# Patient Record
Sex: Female | Born: 1940 | Race: Black or African American | Hispanic: No | Smoking: Never smoker
Health system: Southern US, Community
[De-identification: ages and names within clinical notes are randomized; demographics above are authoritative.]

## PROBLEM LIST (undated history)

## (undated) DIAGNOSIS — E663 Overweight: Secondary | ICD-10-CM

## (undated) DIAGNOSIS — M549 Dorsalgia, unspecified: Secondary | ICD-10-CM

## (undated) DIAGNOSIS — G43909 Migraine, unspecified, not intractable, without status migrainosus: Secondary | ICD-10-CM

## (undated) DIAGNOSIS — K219 Gastro-esophageal reflux disease without esophagitis: Secondary | ICD-10-CM

## (undated) DIAGNOSIS — J189 Pneumonia, unspecified organism: Secondary | ICD-10-CM

## (undated) DIAGNOSIS — M1990 Unspecified osteoarthritis, unspecified site: Secondary | ICD-10-CM

## (undated) DIAGNOSIS — R233 Spontaneous ecchymoses: Secondary | ICD-10-CM

## (undated) DIAGNOSIS — R351 Nocturia: Secondary | ICD-10-CM

## (undated) DIAGNOSIS — F329 Major depressive disorder, single episode, unspecified: Secondary | ICD-10-CM

## (undated) DIAGNOSIS — I2699 Other pulmonary embolism without acute cor pulmonale: Secondary | ICD-10-CM

## (undated) DIAGNOSIS — R350 Frequency of micturition: Secondary | ICD-10-CM

## (undated) DIAGNOSIS — Z7901 Long term (current) use of anticoagulants: Secondary | ICD-10-CM

## (undated) DIAGNOSIS — M2550 Pain in unspecified joint: Secondary | ICD-10-CM

## (undated) DIAGNOSIS — G8929 Other chronic pain: Secondary | ICD-10-CM

## (undated) DIAGNOSIS — E785 Hyperlipidemia, unspecified: Secondary | ICD-10-CM

## (undated) DIAGNOSIS — F0390 Unspecified dementia without behavioral disturbance: Secondary | ICD-10-CM

## (undated) DIAGNOSIS — I4891 Unspecified atrial fibrillation: Secondary | ICD-10-CM

## (undated) DIAGNOSIS — I10 Essential (primary) hypertension: Secondary | ICD-10-CM

## (undated) DIAGNOSIS — Z860100 Personal history of colon polyps, unspecified: Secondary | ICD-10-CM

## (undated) DIAGNOSIS — F419 Anxiety disorder, unspecified: Secondary | ICD-10-CM

## (undated) DIAGNOSIS — F32A Depression, unspecified: Secondary | ICD-10-CM

## (undated) DIAGNOSIS — Z8739 Personal history of other diseases of the musculoskeletal system and connective tissue: Secondary | ICD-10-CM

## (undated) DIAGNOSIS — D509 Iron deficiency anemia, unspecified: Secondary | ICD-10-CM

## (undated) DIAGNOSIS — Z8669 Personal history of other diseases of the nervous system and sense organs: Secondary | ICD-10-CM

## (undated) DIAGNOSIS — R413 Other amnesia: Secondary | ICD-10-CM

## (undated) DIAGNOSIS — G4700 Insomnia, unspecified: Secondary | ICD-10-CM

## (undated) DIAGNOSIS — R238 Other skin changes: Secondary | ICD-10-CM

## (undated) DIAGNOSIS — H409 Unspecified glaucoma: Secondary | ICD-10-CM

## (undated) DIAGNOSIS — Z9289 Personal history of other medical treatment: Secondary | ICD-10-CM

## (undated) DIAGNOSIS — L814 Other melanin hyperpigmentation: Secondary | ICD-10-CM

## (undated) DIAGNOSIS — M2540 Effusion, unspecified joint: Secondary | ICD-10-CM

## (undated) HISTORY — PX: DILATION AND CURETTAGE OF UTERUS: SHX78

## (undated) HISTORY — DX: Unspecified osteoarthritis, unspecified site: M19.90

## (undated) HISTORY — DX: Gastro-esophageal reflux disease without esophagitis: K21.9

## (undated) HISTORY — PX: ORIF ANKLE FRACTURE: SUR919

## (undated) HISTORY — DX: Overweight: E66.3

## (undated) HISTORY — DX: Other pulmonary embolism without acute cor pulmonale: I26.99

## (undated) HISTORY — DX: Unspecified atrial fibrillation: I48.91

## (undated) HISTORY — PX: UPPER GASTROINTESTINAL ENDOSCOPY: SHX188

## (undated) HISTORY — PX: COLONOSCOPY: SHX174

## (undated) HISTORY — PX: TONSILLECTOMY: SUR1361

## (undated) HISTORY — DX: Essential (primary) hypertension: I10

## (undated) HISTORY — DX: Iron deficiency anemia, unspecified: D50.9

## (undated) HISTORY — DX: Migraine, unspecified, not intractable, without status migrainosus: G43.909

## (undated) HISTORY — DX: Other amnesia: R41.3

## (undated) HISTORY — PX: URETHRAL DILATION: SUR417

## (undated) HISTORY — DX: Long term (current) use of anticoagulants: Z79.01

## (undated) HISTORY — DX: Hyperlipidemia, unspecified: E78.5

## (undated) HISTORY — DX: Depression, unspecified: F32.A

## (undated) HISTORY — PX: KNEE ARTHROSCOPY W/ MENISCECTOMY: SHX1879

## (undated) HISTORY — DX: Major depressive disorder, single episode, unspecified: F32.9

---

## 1967-02-16 DIAGNOSIS — J189 Pneumonia, unspecified organism: Secondary | ICD-10-CM

## 1967-02-16 DIAGNOSIS — Z9289 Personal history of other medical treatment: Secondary | ICD-10-CM

## 1967-02-16 HISTORY — DX: Pneumonia, unspecified organism: J18.9

## 1967-02-16 HISTORY — DX: Personal history of other medical treatment: Z92.89

## 2000-05-06 ENCOUNTER — Encounter (INDEPENDENT_AMBULATORY_CARE_PROVIDER_SITE_OTHER): Payer: Self-pay | Admitting: *Deleted

## 2000-05-28 ENCOUNTER — Emergency Department (HOSPITAL_COMMUNITY): Admission: EM | Admit: 2000-05-28 | Discharge: 2000-05-28 | Payer: Self-pay | Admitting: Emergency Medicine

## 2000-05-28 ENCOUNTER — Encounter: Payer: Self-pay | Admitting: Emergency Medicine

## 2000-12-28 ENCOUNTER — Ambulatory Visit (HOSPITAL_COMMUNITY): Admission: RE | Admit: 2000-12-28 | Discharge: 2000-12-28 | Payer: Self-pay | Admitting: Family Medicine

## 2000-12-28 ENCOUNTER — Encounter: Payer: Self-pay | Admitting: Family Medicine

## 2001-03-02 ENCOUNTER — Ambulatory Visit (HOSPITAL_COMMUNITY): Admission: RE | Admit: 2001-03-02 | Discharge: 2001-03-02 | Payer: Self-pay | Admitting: Otolaryngology

## 2001-03-02 ENCOUNTER — Encounter: Payer: Self-pay | Admitting: Otolaryngology

## 2001-08-15 ENCOUNTER — Encounter: Payer: Self-pay | Admitting: Family Medicine

## 2001-08-15 ENCOUNTER — Ambulatory Visit (HOSPITAL_COMMUNITY): Admission: RE | Admit: 2001-08-15 | Discharge: 2001-08-15 | Payer: Self-pay | Admitting: Family Medicine

## 2001-12-13 ENCOUNTER — Encounter: Payer: Self-pay | Admitting: Family Medicine

## 2001-12-13 ENCOUNTER — Ambulatory Visit (HOSPITAL_COMMUNITY): Admission: RE | Admit: 2001-12-13 | Discharge: 2001-12-13 | Payer: Self-pay | Admitting: Family Medicine

## 2002-01-18 ENCOUNTER — Ambulatory Visit (HOSPITAL_COMMUNITY): Admission: RE | Admit: 2002-01-18 | Discharge: 2002-01-18 | Payer: Self-pay | Admitting: Family Medicine

## 2002-01-18 ENCOUNTER — Encounter: Payer: Self-pay | Admitting: Family Medicine

## 2002-02-04 ENCOUNTER — Emergency Department (HOSPITAL_COMMUNITY): Admission: EM | Admit: 2002-02-04 | Discharge: 2002-02-04 | Payer: Self-pay | Admitting: Emergency Medicine

## 2002-02-28 ENCOUNTER — Ambulatory Visit (HOSPITAL_COMMUNITY): Admission: RE | Admit: 2002-02-28 | Discharge: 2002-02-28 | Payer: Self-pay | Admitting: Family Medicine

## 2002-02-28 ENCOUNTER — Encounter: Payer: Self-pay | Admitting: Family Medicine

## 2002-03-05 ENCOUNTER — Ambulatory Visit (HOSPITAL_COMMUNITY): Admission: RE | Admit: 2002-03-05 | Discharge: 2002-03-05 | Payer: Self-pay | Admitting: Internal Medicine

## 2002-03-14 ENCOUNTER — Ambulatory Visit (HOSPITAL_COMMUNITY): Admission: RE | Admit: 2002-03-14 | Discharge: 2002-03-14 | Payer: Self-pay | Admitting: General Surgery

## 2002-03-19 ENCOUNTER — Ambulatory Visit (HOSPITAL_COMMUNITY): Admission: RE | Admit: 2002-03-19 | Discharge: 2002-03-19 | Payer: Self-pay | Admitting: Urology

## 2002-03-19 ENCOUNTER — Encounter: Payer: Self-pay | Admitting: Urology

## 2002-05-29 ENCOUNTER — Encounter: Payer: Self-pay | Admitting: Family Medicine

## 2002-05-29 ENCOUNTER — Ambulatory Visit (HOSPITAL_COMMUNITY): Admission: RE | Admit: 2002-05-29 | Discharge: 2002-05-29 | Payer: Self-pay | Admitting: Family Medicine

## 2002-06-27 ENCOUNTER — Emergency Department (HOSPITAL_COMMUNITY): Admission: EM | Admit: 2002-06-27 | Discharge: 2002-06-27 | Payer: Self-pay | Admitting: Internal Medicine

## 2002-10-12 ENCOUNTER — Encounter: Payer: Self-pay | Admitting: Orthopedic Surgery

## 2002-10-12 ENCOUNTER — Ambulatory Visit: Admission: RE | Admit: 2002-10-12 | Discharge: 2002-10-12 | Payer: Self-pay | Admitting: Orthopedic Surgery

## 2003-02-11 ENCOUNTER — Ambulatory Visit (HOSPITAL_COMMUNITY): Admission: RE | Admit: 2003-02-11 | Discharge: 2003-02-11 | Payer: Self-pay | Admitting: Family Medicine

## 2003-03-15 ENCOUNTER — Inpatient Hospital Stay (HOSPITAL_COMMUNITY): Admission: EM | Admit: 2003-03-15 | Discharge: 2003-03-18 | Payer: Self-pay | Admitting: *Deleted

## 2003-04-15 ENCOUNTER — Ambulatory Visit (HOSPITAL_COMMUNITY): Admission: RE | Admit: 2003-04-15 | Discharge: 2003-04-15 | Payer: Self-pay | Admitting: Family Medicine

## 2003-06-14 ENCOUNTER — Ambulatory Visit (HOSPITAL_COMMUNITY): Admission: RE | Admit: 2003-06-14 | Discharge: 2003-06-14 | Payer: Self-pay | Admitting: Family Medicine

## 2003-09-17 ENCOUNTER — Ambulatory Visit (HOSPITAL_COMMUNITY): Admission: RE | Admit: 2003-09-17 | Discharge: 2003-09-17 | Payer: Self-pay | Admitting: Internal Medicine

## 2003-09-27 ENCOUNTER — Inpatient Hospital Stay (HOSPITAL_COMMUNITY): Admission: EM | Admit: 2003-09-27 | Discharge: 2003-09-29 | Payer: Self-pay | Admitting: Emergency Medicine

## 2003-12-24 ENCOUNTER — Ambulatory Visit: Payer: Self-pay | Admitting: Family Medicine

## 2003-12-25 ENCOUNTER — Ambulatory Visit: Payer: Self-pay | Admitting: Orthopedic Surgery

## 2004-01-13 ENCOUNTER — Ambulatory Visit: Payer: Self-pay | Admitting: Family Medicine

## 2004-01-30 ENCOUNTER — Ambulatory Visit: Payer: Self-pay | Admitting: Family Medicine

## 2004-03-16 ENCOUNTER — Ambulatory Visit: Payer: Self-pay | Admitting: Family Medicine

## 2004-04-08 ENCOUNTER — Ambulatory Visit: Payer: Self-pay | Admitting: Family Medicine

## 2004-05-07 ENCOUNTER — Ambulatory Visit: Payer: Self-pay | Admitting: Orthopedic Surgery

## 2004-05-30 ENCOUNTER — Emergency Department (HOSPITAL_COMMUNITY): Admission: EM | Admit: 2004-05-30 | Discharge: 2004-05-30 | Payer: Self-pay | Admitting: Emergency Medicine

## 2004-07-01 ENCOUNTER — Ambulatory Visit: Payer: Self-pay | Admitting: Family Medicine

## 2004-07-10 ENCOUNTER — Ambulatory Visit: Payer: Self-pay | Admitting: Family Medicine

## 2004-07-21 ENCOUNTER — Ambulatory Visit: Payer: Self-pay | Admitting: Family Medicine

## 2004-07-24 ENCOUNTER — Ambulatory Visit (HOSPITAL_COMMUNITY): Admission: RE | Admit: 2004-07-24 | Discharge: 2004-07-24 | Payer: Self-pay | Admitting: Family Medicine

## 2004-08-20 ENCOUNTER — Ambulatory Visit: Payer: Self-pay | Admitting: Family Medicine

## 2004-09-09 ENCOUNTER — Emergency Department (HOSPITAL_COMMUNITY): Admission: EM | Admit: 2004-09-09 | Discharge: 2004-09-09 | Payer: Self-pay | Admitting: Emergency Medicine

## 2004-10-14 ENCOUNTER — Ambulatory Visit: Payer: Self-pay | Admitting: Family Medicine

## 2004-12-29 ENCOUNTER — Ambulatory Visit: Payer: Self-pay | Admitting: Family Medicine

## 2005-02-01 ENCOUNTER — Ambulatory Visit: Payer: Self-pay | Admitting: Family Medicine

## 2005-03-12 ENCOUNTER — Ambulatory Visit (HOSPITAL_COMMUNITY): Admission: RE | Admit: 2005-03-12 | Discharge: 2005-03-12 | Payer: Self-pay | Admitting: Family Medicine

## 2005-03-17 ENCOUNTER — Ambulatory Visit: Payer: Self-pay | Admitting: Family Medicine

## 2005-04-27 ENCOUNTER — Ambulatory Visit: Payer: Self-pay | Admitting: Family Medicine

## 2005-05-16 ENCOUNTER — Encounter (INDEPENDENT_AMBULATORY_CARE_PROVIDER_SITE_OTHER): Payer: Self-pay | Admitting: *Deleted

## 2005-05-16 LAB — CONVERTED CEMR LAB: Pap Smear: NORMAL

## 2005-05-25 ENCOUNTER — Encounter: Payer: Self-pay | Admitting: Family Medicine

## 2005-05-25 ENCOUNTER — Other Ambulatory Visit: Admission: RE | Admit: 2005-05-25 | Discharge: 2005-05-25 | Payer: Self-pay | Admitting: Family Medicine

## 2005-05-25 ENCOUNTER — Ambulatory Visit: Payer: Self-pay | Admitting: Family Medicine

## 2005-05-27 ENCOUNTER — Ambulatory Visit (HOSPITAL_COMMUNITY): Admission: RE | Admit: 2005-05-27 | Discharge: 2005-05-27 | Payer: Self-pay | Admitting: Family Medicine

## 2005-07-06 ENCOUNTER — Ambulatory Visit: Payer: Self-pay | Admitting: Family Medicine

## 2005-07-15 ENCOUNTER — Ambulatory Visit: Payer: Self-pay | Admitting: Family Medicine

## 2005-07-15 ENCOUNTER — Ambulatory Visit (HOSPITAL_COMMUNITY): Admission: RE | Admit: 2005-07-15 | Discharge: 2005-07-15 | Payer: Self-pay | Admitting: Family Medicine

## 2005-08-19 ENCOUNTER — Ambulatory Visit: Payer: Self-pay | Admitting: Family Medicine

## 2005-08-20 ENCOUNTER — Ambulatory Visit: Payer: Self-pay | Admitting: Cardiology

## 2005-08-30 ENCOUNTER — Ambulatory Visit: Payer: Self-pay | Admitting: Cardiology

## 2005-08-30 ENCOUNTER — Encounter (HOSPITAL_COMMUNITY): Admission: RE | Admit: 2005-08-30 | Discharge: 2005-09-29 | Payer: Self-pay | Admitting: Cardiology

## 2005-08-30 ENCOUNTER — Ambulatory Visit: Payer: Self-pay | Admitting: *Deleted

## 2005-08-31 ENCOUNTER — Ambulatory Visit (HOSPITAL_COMMUNITY): Admission: RE | Admit: 2005-08-31 | Discharge: 2005-08-31 | Payer: Self-pay | Admitting: Family Medicine

## 2005-09-03 ENCOUNTER — Ambulatory Visit: Payer: Self-pay | Admitting: *Deleted

## 2005-09-14 ENCOUNTER — Ambulatory Visit: Payer: Self-pay | Admitting: *Deleted

## 2005-10-14 ENCOUNTER — Ambulatory Visit: Payer: Self-pay | Admitting: Family Medicine

## 2005-11-18 ENCOUNTER — Ambulatory Visit: Payer: Self-pay | Admitting: Family Medicine

## 2005-12-09 ENCOUNTER — Ambulatory Visit: Payer: Self-pay | Admitting: Family Medicine

## 2005-12-14 ENCOUNTER — Ambulatory Visit (HOSPITAL_COMMUNITY): Admission: RE | Admit: 2005-12-14 | Discharge: 2005-12-14 | Payer: Self-pay | Admitting: Family Medicine

## 2006-02-23 ENCOUNTER — Ambulatory Visit: Payer: Self-pay | Admitting: Family Medicine

## 2006-04-12 ENCOUNTER — Ambulatory Visit: Payer: Self-pay | Admitting: Family Medicine

## 2006-05-04 ENCOUNTER — Encounter: Payer: Self-pay | Admitting: Family Medicine

## 2006-05-04 ENCOUNTER — Ambulatory Visit (HOSPITAL_COMMUNITY): Admission: RE | Admit: 2006-05-04 | Discharge: 2006-05-04 | Payer: Self-pay | Admitting: Family Medicine

## 2006-05-04 LAB — CONVERTED CEMR LAB
ALT: 12 units/L (ref 0–35)
AST: 14 units/L (ref 0–37)
Albumin: 4.1 g/dL (ref 3.5–5.2)
Basophils Absolute: 0 10*3/uL (ref 0.0–0.1)
Bilirubin, Direct: <0.1 mg/dL (ref 0.0–0.3)
CO2: 23 meq/L (ref 19–32)
Calcium: 9.2 mg/dL (ref 8.4–10.5)
Creatinine, Ser: 1.02 mg/dL (ref 0.40–1.20)
Glucose, Bld: 92 mg/dL (ref 70–99)
HCT: 36.4 % (ref 36.0–46.0)
HDL: 58 mg/dL (ref >39)
Hemoglobin: 11.9 g/dL — ABNORMAL LOW (ref 12.0–15.0)
Hgb A1c MFr Bld: 6 % (ref 4.6–6.1)
MCV: 87.7 fL (ref 78.0–100.0)
Monocytes Relative: 6 % (ref 3–11)
Neutrophils Relative %: 54 % (ref 43–77)
Platelets: 351 10*3/uL (ref 150–400)
Potassium: 3.6 meq/L (ref 3.5–5.3)
RBC: 4.15 M/uL (ref 3.87–5.11)
VLDL: 26 mg/dL (ref 0–40)

## 2006-05-17 ENCOUNTER — Encounter (INDEPENDENT_AMBULATORY_CARE_PROVIDER_SITE_OTHER): Payer: Self-pay | Admitting: *Deleted

## 2006-05-17 ENCOUNTER — Other Ambulatory Visit: Admission: RE | Admit: 2006-05-17 | Discharge: 2006-05-17 | Payer: Self-pay | Admitting: Family Medicine

## 2006-05-17 ENCOUNTER — Ambulatory Visit: Payer: Self-pay | Admitting: Family Medicine

## 2006-05-17 ENCOUNTER — Encounter: Payer: Self-pay | Admitting: Family Medicine

## 2006-05-19 ENCOUNTER — Encounter: Payer: Self-pay | Admitting: Family Medicine

## 2006-07-08 ENCOUNTER — Ambulatory Visit: Payer: Self-pay | Admitting: Family Medicine

## 2006-08-18 ENCOUNTER — Ambulatory Visit: Payer: Self-pay | Admitting: Family Medicine

## 2006-08-23 ENCOUNTER — Encounter: Payer: Self-pay | Admitting: Family Medicine

## 2007-01-05 ENCOUNTER — Ambulatory Visit: Payer: Self-pay | Admitting: Family Medicine

## 2007-02-03 ENCOUNTER — Encounter (INDEPENDENT_AMBULATORY_CARE_PROVIDER_SITE_OTHER): Payer: Self-pay | Admitting: *Deleted

## 2007-02-20 ENCOUNTER — Ambulatory Visit: Payer: Self-pay | Admitting: Family Medicine

## 2007-02-21 ENCOUNTER — Encounter: Payer: Self-pay | Admitting: Family Medicine

## 2007-03-24 ENCOUNTER — Ambulatory Visit: Payer: Self-pay | Admitting: Family Medicine

## 2007-03-24 ENCOUNTER — Ambulatory Visit (HOSPITAL_COMMUNITY): Admission: RE | Admit: 2007-03-24 | Discharge: 2007-03-24 | Payer: Self-pay | Admitting: Family Medicine

## 2007-05-23 ENCOUNTER — Ambulatory Visit: Payer: Self-pay | Admitting: Family Medicine

## 2007-05-24 ENCOUNTER — Encounter: Payer: Self-pay | Admitting: Family Medicine

## 2007-05-24 LAB — CONVERTED CEMR LAB: Microalb, Ur: 1.32 mg/dL (ref 0.00–1.89)

## 2007-05-30 DIAGNOSIS — K219 Gastro-esophageal reflux disease without esophagitis: Secondary | ICD-10-CM

## 2007-05-30 DIAGNOSIS — G43909 Migraine, unspecified, not intractable, without status migrainosus: Secondary | ICD-10-CM

## 2007-05-30 DIAGNOSIS — E785 Hyperlipidemia, unspecified: Secondary | ICD-10-CM

## 2007-05-30 DIAGNOSIS — F068 Other specified mental disorders due to known physiological condition: Secondary | ICD-10-CM

## 2007-05-30 DIAGNOSIS — I10 Essential (primary) hypertension: Secondary | ICD-10-CM

## 2007-06-06 ENCOUNTER — Encounter: Payer: Self-pay | Admitting: Family Medicine

## 2007-06-06 LAB — CONVERTED CEMR LAB
ALT: 17 units/L (ref 0–35)
Albumin: 4.1 g/dL (ref 3.5–5.2)
Alkaline Phosphatase: 96 units/L (ref 39–117)
BUN: 31 mg/dL — ABNORMAL HIGH (ref 6–23)
Basophils Relative: 0 % (ref 0–1)
Calcium: 9.8 mg/dL (ref 8.4–10.5)
Chloride: 105 meq/L (ref 96–112)
Cholesterol: 226 mg/dL — ABNORMAL HIGH (ref 0–200)
Eosinophils Absolute: 0 10*3/uL (ref 0.0–0.7)
Eosinophils Relative: 0 % (ref 0–5)
Glucose, Bld: 102 mg/dL — ABNORMAL HIGH (ref 70–99)
HDL: 84 mg/dL (ref >39)
MCV: 93 fL (ref 78.0–100.0)
Monocytes Absolute: 0.4 10*3/uL (ref 0.1–1.0)
Neutro Abs: 2.7 10*3/uL (ref 1.7–7.7)
Neutrophils Relative %: 50 % (ref 43–77)
Platelets: 297 10*3/uL (ref 150–400)
RBC: 4.17 M/uL (ref 3.87–5.11)
RDW: 14.6 % (ref 11.5–15.5)
Total Protein: 7.3 g/dL (ref 6.0–8.3)
VLDL: 25 mg/dL (ref 0–40)
WBC: 5.3 10*3/uL (ref 4.0–10.5)

## 2007-07-24 ENCOUNTER — Ambulatory Visit: Payer: Self-pay | Admitting: Family Medicine

## 2007-10-09 ENCOUNTER — Ambulatory Visit: Payer: Self-pay | Admitting: Family Medicine

## 2007-10-26 ENCOUNTER — Encounter: Payer: Self-pay | Admitting: Family Medicine

## 2007-10-30 LAB — CONVERTED CEMR LAB
Cholesterol: 245 mg/dL — ABNORMAL HIGH (ref 0–200)
LDL Cholesterol: 134 mg/dL — ABNORMAL HIGH (ref 0–99)
Total CHOL/HDL Ratio: 3
Triglycerides: 145 mg/dL (ref <150)
VLDL: 29 mg/dL (ref 0–40)

## 2007-11-07 ENCOUNTER — Encounter: Payer: Self-pay | Admitting: Family Medicine

## 2007-11-14 ENCOUNTER — Ambulatory Visit: Payer: Self-pay | Admitting: Family Medicine

## 2007-12-04 ENCOUNTER — Encounter: Payer: Self-pay | Admitting: Family Medicine

## 2007-12-07 ENCOUNTER — Encounter: Payer: Self-pay | Admitting: Family Medicine

## 2007-12-15 ENCOUNTER — Encounter: Payer: Self-pay | Admitting: Family Medicine

## 2007-12-18 ENCOUNTER — Encounter: Payer: Self-pay | Admitting: Family Medicine

## 2007-12-19 ENCOUNTER — Ambulatory Visit: Payer: Self-pay | Admitting: Family Medicine

## 2007-12-25 ENCOUNTER — Encounter: Payer: Self-pay | Admitting: Family Medicine

## 2008-01-02 ENCOUNTER — Encounter: Payer: Self-pay | Admitting: Family Medicine

## 2008-01-22 ENCOUNTER — Emergency Department (HOSPITAL_COMMUNITY): Admission: EM | Admit: 2008-01-22 | Discharge: 2008-01-22 | Payer: Self-pay | Admitting: Emergency Medicine

## 2008-01-26 ENCOUNTER — Ambulatory Visit: Payer: Self-pay | Admitting: Family Medicine

## 2008-01-26 DIAGNOSIS — R0989 Other specified symptoms and signs involving the circulatory and respiratory systems: Secondary | ICD-10-CM

## 2008-02-19 ENCOUNTER — Encounter: Payer: Self-pay | Admitting: Family Medicine

## 2008-02-20 ENCOUNTER — Ambulatory Visit: Payer: Self-pay | Admitting: Family Medicine

## 2008-02-20 DIAGNOSIS — Z8639 Personal history of other endocrine, nutritional and metabolic disease: Secondary | ICD-10-CM | POA: Insufficient documentation

## 2008-02-20 LAB — CONVERTED CEMR LAB: Glucose, Bld: 136 mg/dL

## 2008-02-21 ENCOUNTER — Encounter: Payer: Self-pay | Admitting: Family Medicine

## 2008-02-22 LAB — CONVERTED CEMR LAB
ALT: 22 units/L (ref 0–35)
AST: 15 units/L (ref 0–37)
Albumin: 4.3 g/dL (ref 3.5–5.2)
BUN: 29 mg/dL — ABNORMAL HIGH (ref 6–23)
Bilirubin, Direct: 0.1 mg/dL (ref 0.0–0.3)
Potassium: 4 meq/L (ref 3.5–5.3)
Sodium: 145 meq/L (ref 135–145)
TSH: 0.685 microintl units/mL (ref 0.350–4.50)
Total Bilirubin: 0.4 mg/dL (ref 0.3–1.2)
Total CHOL/HDL Ratio: 3.2
Triglycerides: 93 mg/dL (ref <150)
VLDL: 19 mg/dL (ref 0–40)

## 2008-02-26 DIAGNOSIS — F329 Major depressive disorder, single episode, unspecified: Secondary | ICD-10-CM

## 2008-03-14 ENCOUNTER — Encounter: Payer: Self-pay | Admitting: Family Medicine

## 2008-03-15 ENCOUNTER — Telehealth: Payer: Self-pay | Admitting: Family Medicine

## 2008-04-02 ENCOUNTER — Telehealth: Payer: Self-pay | Admitting: Family Medicine

## 2008-04-03 ENCOUNTER — Encounter: Payer: Self-pay | Admitting: Family Medicine

## 2008-04-03 ENCOUNTER — Telehealth: Payer: Self-pay | Admitting: Family Medicine

## 2008-06-07 ENCOUNTER — Telehealth: Payer: Self-pay | Admitting: Family Medicine

## 2008-06-10 ENCOUNTER — Encounter: Payer: Self-pay | Admitting: Family Medicine

## 2008-06-17 ENCOUNTER — Telehealth: Payer: Self-pay | Admitting: Family Medicine

## 2008-06-17 ENCOUNTER — Ambulatory Visit: Payer: Self-pay | Admitting: Family Medicine

## 2008-06-17 LAB — CONVERTED CEMR LAB
AST: 15 units/L (ref 0–37)
Albumin: 4.3 g/dL (ref 3.5–5.2)
Alkaline Phosphatase: 116 units/L (ref 39–117)
Basophils Absolute: 0 10*3/uL (ref 0.0–0.1)
Bilirubin, Direct: <0.1 mg/dL (ref 0.0–0.3)
Cholesterol: 173 mg/dL (ref 0–200)
Eosinophils Relative: 0 % (ref 0–5)
LDL Cholesterol: 72 mg/dL (ref 0–99)
Lymphs Abs: 1.7 10*3/uL (ref 0.7–4.0)
MCHC: 32.2 g/dL (ref 30.0–36.0)
Monocytes Absolute: 0.6 10*3/uL (ref 0.1–1.0)
Monocytes Relative: 5 % (ref 3–12)
Neutro Abs: 8.7 10*3/uL — ABNORMAL HIGH (ref 1.7–7.7)
Platelets: 301 10*3/uL (ref 150–400)
Total CHOL/HDL Ratio: 2.2
Total Protein: 7.5 g/dL (ref 6.0–8.3)
Triglycerides: 107 mg/dL (ref <150)
WBC: 11 10*3/uL — ABNORMAL HIGH (ref 4.0–10.5)

## 2008-07-01 ENCOUNTER — Encounter: Payer: Self-pay | Admitting: Family Medicine

## 2008-07-02 ENCOUNTER — Telehealth: Payer: Self-pay | Admitting: Family Medicine

## 2008-08-06 ENCOUNTER — Ambulatory Visit: Payer: Self-pay | Admitting: Family Medicine

## 2008-08-06 ENCOUNTER — Ambulatory Visit (HOSPITAL_COMMUNITY): Admission: RE | Admit: 2008-08-06 | Discharge: 2008-08-06 | Payer: Self-pay | Admitting: Family Medicine

## 2008-08-12 ENCOUNTER — Encounter: Payer: Self-pay | Admitting: Family Medicine

## 2008-08-20 ENCOUNTER — Telehealth: Payer: Self-pay | Admitting: Family Medicine

## 2008-08-23 ENCOUNTER — Encounter: Payer: Self-pay | Admitting: Family Medicine

## 2008-08-26 ENCOUNTER — Encounter: Payer: Self-pay | Admitting: Family Medicine

## 2008-10-22 ENCOUNTER — Ambulatory Visit: Payer: Self-pay | Admitting: Family Medicine

## 2008-10-22 ENCOUNTER — Other Ambulatory Visit: Admission: RE | Admit: 2008-10-22 | Discharge: 2008-10-22 | Payer: Self-pay | Admitting: Family Medicine

## 2008-10-22 ENCOUNTER — Encounter (INDEPENDENT_AMBULATORY_CARE_PROVIDER_SITE_OTHER): Payer: Self-pay | Admitting: *Deleted

## 2008-10-22 ENCOUNTER — Encounter: Payer: Self-pay | Admitting: Family Medicine

## 2008-10-22 LAB — CONVERTED CEMR LAB: Blood Glucose, Fasting: 149 mg/dL

## 2008-11-11 ENCOUNTER — Telehealth: Payer: Self-pay | Admitting: Family Medicine

## 2008-12-10 ENCOUNTER — Telehealth: Payer: Self-pay | Admitting: Family Medicine

## 2008-12-17 ENCOUNTER — Telehealth: Payer: Self-pay | Admitting: Family Medicine

## 2009-01-21 ENCOUNTER — Ambulatory Visit: Payer: Self-pay | Admitting: Family Medicine

## 2009-01-21 LAB — CONVERTED CEMR LAB
Glucose, Bld: 122 mg/dL
Hgb A1c MFr Bld: 6.1 %

## 2009-01-21 LAB — HM DIABETES FOOT EXAM

## 2009-02-03 ENCOUNTER — Ambulatory Visit: Payer: Self-pay | Admitting: Family Medicine

## 2009-02-03 LAB — CONVERTED CEMR LAB
AST: 18 units/L (ref 0–37)
Albumin: 3.9 g/dL (ref 3.5–5.2)
Creatinine, Ser: 0.95 mg/dL (ref 0.40–1.20)
LDL Cholesterol: 71 mg/dL (ref 0–99)
Sodium: 143 meq/L (ref 135–145)
Total Bilirubin: 0.1 mg/dL — ABNORMAL LOW (ref 0.3–1.2)
Total Protein: 6.9 g/dL (ref 6.0–8.3)
Triglycerides: 82 mg/dL (ref <150)
VLDL: 16 mg/dL (ref 0–40)

## 2009-02-20 ENCOUNTER — Encounter: Payer: Self-pay | Admitting: Family Medicine

## 2009-02-20 LAB — CONVERTED CEMR LAB

## 2009-02-21 ENCOUNTER — Telehealth: Payer: Self-pay | Admitting: Family Medicine

## 2009-03-27 ENCOUNTER — Encounter (INDEPENDENT_AMBULATORY_CARE_PROVIDER_SITE_OTHER): Payer: Self-pay | Admitting: *Deleted

## 2009-06-05 ENCOUNTER — Encounter: Payer: Self-pay | Admitting: Family Medicine

## 2009-06-05 DIAGNOSIS — R5381 Other malaise: Secondary | ICD-10-CM

## 2009-06-05 DIAGNOSIS — R5383 Other fatigue: Secondary | ICD-10-CM

## 2009-06-09 ENCOUNTER — Telehealth: Payer: Self-pay | Admitting: Family Medicine

## 2009-06-09 ENCOUNTER — Ambulatory Visit: Payer: Self-pay | Admitting: Family Medicine

## 2009-06-09 DIAGNOSIS — H6690 Otitis media, unspecified, unspecified ear: Secondary | ICD-10-CM | POA: Insufficient documentation

## 2009-06-09 DIAGNOSIS — M1710 Unilateral primary osteoarthritis, unspecified knee: Secondary | ICD-10-CM | POA: Insufficient documentation

## 2009-06-09 DIAGNOSIS — R42 Dizziness and giddiness: Secondary | ICD-10-CM | POA: Insufficient documentation

## 2009-06-09 DIAGNOSIS — M26609 Unspecified temporomandibular joint disorder, unspecified side: Secondary | ICD-10-CM | POA: Insufficient documentation

## 2009-06-09 LAB — CONVERTED CEMR LAB: Blood Glucose, Fasting: 134 mg/dL

## 2009-06-16 ENCOUNTER — Encounter: Payer: Self-pay | Admitting: Family Medicine

## 2009-06-23 LAB — CONVERTED CEMR LAB
BUN: 19 mg/dL (ref 6–23)
Bilirubin, Direct: 0.1 mg/dL (ref 0.0–0.3)
CO2: 23 meq/L (ref 19–32)
Chloride: 106 meq/L (ref 96–112)
Eosinophils Relative: 0 % (ref 0–5)
Glucose, Bld: 117 mg/dL — ABNORMAL HIGH (ref 70–99)
HCT: 37.2 % (ref 36.0–46.0)
Hemoglobin: 11.9 g/dL — ABNORMAL LOW (ref 12.0–15.0)
Hgb A1c MFr Bld: 6.3 % — ABNORMAL HIGH (ref <5.7)
Indirect Bilirubin: 0.2 mg/dL (ref 0.0–0.9)
LDL Cholesterol: 82 mg/dL (ref 0–99)
Lymphocytes Relative: 37 % (ref 12–46)
Lymphs Abs: 2 10*3/uL (ref 0.7–4.0)
Monocytes Absolute: 0.4 10*3/uL (ref 0.1–1.0)
Potassium: 4.1 meq/L (ref 3.5–5.3)
Total Protein: 7.4 g/dL (ref 6.0–8.3)
VLDL: 21 mg/dL (ref 0–40)
Vit D, 25-Hydroxy: 10 ng/mL — ABNORMAL LOW (ref 30–89)
WBC: 5.4 10*3/uL (ref 4.0–10.5)

## 2009-09-03 ENCOUNTER — Ambulatory Visit: Payer: Self-pay | Admitting: Family Medicine

## 2009-09-03 DIAGNOSIS — L299 Pruritus, unspecified: Secondary | ICD-10-CM

## 2009-09-03 DIAGNOSIS — N3000 Acute cystitis without hematuria: Secondary | ICD-10-CM

## 2009-09-03 LAB — CONVERTED CEMR LAB
Bilirubin Urine: NEGATIVE
Ketones, urine, test strip: NEGATIVE
Nitrite: NEGATIVE
Specific Gravity, Urine: 1.015

## 2009-09-04 ENCOUNTER — Encounter: Payer: Self-pay | Admitting: Family Medicine

## 2009-09-08 ENCOUNTER — Ambulatory Visit: Payer: Self-pay | Admitting: Family Medicine

## 2009-11-18 ENCOUNTER — Telehealth: Payer: Self-pay | Admitting: Family Medicine

## 2009-11-18 ENCOUNTER — Ambulatory Visit (HOSPITAL_COMMUNITY): Admission: RE | Admit: 2009-11-18 | Discharge: 2009-11-18 | Payer: Self-pay | Admitting: Family Medicine

## 2009-11-25 ENCOUNTER — Ambulatory Visit: Payer: Self-pay | Admitting: Family Medicine

## 2009-11-25 DIAGNOSIS — R269 Unspecified abnormalities of gait and mobility: Secondary | ICD-10-CM | POA: Insufficient documentation

## 2009-11-25 DIAGNOSIS — H538 Other visual disturbances: Secondary | ICD-10-CM | POA: Insufficient documentation

## 2009-11-27 LAB — CONVERTED CEMR LAB
ALT: 13 units/L (ref 0–35)
AST: 15 units/L (ref 0–37)
Alkaline Phosphatase: 131 units/L — ABNORMAL HIGH (ref 39–117)
BUN: 16 mg/dL (ref 6–23)
Calcium: 9.6 mg/dL (ref 8.4–10.5)
Cholesterol: 194 mg/dL (ref 0–200)
Indirect Bilirubin: 0.3 mg/dL (ref 0.0–0.9)
Potassium: 4.4 meq/L (ref 3.5–5.3)
Total Protein: 7.5 g/dL (ref 6.0–8.3)
Triglycerides: 91 mg/dL (ref <150)
VLDL: 18 mg/dL (ref 0–40)

## 2009-11-28 ENCOUNTER — Encounter: Payer: Self-pay | Admitting: Family Medicine

## 2009-12-09 ENCOUNTER — Encounter: Payer: Self-pay | Admitting: Family Medicine

## 2009-12-10 ENCOUNTER — Encounter: Payer: Self-pay | Admitting: Family Medicine

## 2009-12-12 ENCOUNTER — Encounter: Payer: Self-pay | Admitting: Family Medicine

## 2009-12-12 ENCOUNTER — Ambulatory Visit (HOSPITAL_COMMUNITY): Admission: RE | Admit: 2009-12-12 | Discharge: 2009-12-12 | Payer: Self-pay | Admitting: Family Medicine

## 2010-01-06 ENCOUNTER — Ambulatory Visit: Payer: Self-pay | Admitting: Family Medicine

## 2010-01-06 LAB — CONVERTED CEMR LAB
Blood in Urine, dipstick: NEGATIVE
Nitrite: POSITIVE
Protein, U semiquant: 30
Urobilinogen, UA: 1

## 2010-01-07 ENCOUNTER — Encounter: Payer: Self-pay | Admitting: Family Medicine

## 2010-01-10 ENCOUNTER — Telehealth: Payer: Self-pay | Admitting: Family Medicine

## 2010-02-11 ENCOUNTER — Encounter: Payer: Self-pay | Admitting: Family Medicine

## 2010-02-12 ENCOUNTER — Telehealth: Payer: Self-pay | Admitting: Family Medicine

## 2010-02-12 ENCOUNTER — Emergency Department (HOSPITAL_COMMUNITY)
Admission: EM | Admit: 2010-02-12 | Discharge: 2010-02-12 | Payer: Self-pay | Source: Home / Self Care | Admitting: Emergency Medicine

## 2010-02-15 DIAGNOSIS — D509 Iron deficiency anemia, unspecified: Secondary | ICD-10-CM

## 2010-02-15 HISTORY — DX: Iron deficiency anemia, unspecified: D50.9

## 2010-02-26 ENCOUNTER — Ambulatory Visit
Admission: RE | Admit: 2010-02-26 | Discharge: 2010-02-26 | Payer: Self-pay | Source: Home / Self Care | Attending: Family Medicine | Admitting: Family Medicine

## 2010-03-07 ENCOUNTER — Encounter: Payer: Self-pay | Admitting: Family Medicine

## 2010-03-08 ENCOUNTER — Encounter: Payer: Self-pay | Admitting: Family Medicine

## 2010-03-09 ENCOUNTER — Encounter: Payer: Self-pay | Admitting: Family Medicine

## 2010-03-15 LAB — CONVERTED CEMR LAB
Glucose, Bld: 114 mg/dL
Hgb A1c MFr Bld: 5.9 %

## 2010-03-17 NOTE — Letter (Signed)
Summary: murohy / wainer  murohy / wainer   Imported By: Lind Guest 12/03/2009 17:14:36  _____________________________________________________________________  External Attachment:    Type:   Image     Comment:   External Document

## 2010-03-17 NOTE — Miscellaneous (Signed)
Summary: lab  Clinical Lists Changes  Orders: Added new Test order of T-Syphilis Test (RPR) [phone]) - Signed

## 2010-03-17 NOTE — Miscellaneous (Signed)
Summary: Orders Update  Clinical Lists Changes  Orders: Added new Test order of MRI with Contrast (MRI w/Contrast) - Signed

## 2010-03-17 NOTE — Miscellaneous (Signed)
Summary: refill  Clinical Lists Changes  Medications: Rx of IMIPRAMINE HCL 25 MG  TABS (IMIPRAMINE HCL) four tabs by mouth at bedtime;  #120 x 5;  Signed;  Entered by: Everitt Amber;  Authorized by: Syliva Overman MD;  Method used: Historical    Prescriptions: IMIPRAMINE HCL 25 MG  TABS (IMIPRAMINE HCL) four tabs by mouth at bedtime  #120 x 5   Entered by:   Everitt Amber   Authorized by:   Syliva Overman MD   Signed by:   Everitt Amber on 02/20/2009   Method used:   Historical   RxID:   [id number]

## 2010-03-17 NOTE — Assessment & Plan Note (Signed)
Summary: f up   Vital Signs:  Patient profile:   70 year old female Menstrual status:  postmenopausal Height:      64.5 inches Weight:      205.75 pounds BMI:     34.90 O2 Sat:      98 % on Room air Pulse rate:   76 / minute Pulse rhythm:   regular Resp:     16 per minute BP sitting:   132 / 80  (left arm)  Vitals Entered By: Mauricia Area, CMA  Nutrition Counseling: Patient's BMI is greater than 25 and therefore counseled on weight management options.  O2 Flow:  Room air CC: follow up   CC:  follow up.  History of Present Illness: Uncontrolled headaches, increasing in severity and frequency , on avg 2 bad headaches per week.She has unsteady gait , slurred speech, and blurry vision since the past 8 months. Four family members have had brain aneurysms, all cousins, 3 deceased due to aneurysm. Increasing pain and instablity of the left knee, has appt with Dr Eulah Pont in 3 days   Allergies (verified): 1)  ! Sulfa 2)  ! Codeine  Review of Systems      See HPI General:  Complains of fatigue and sleep disorder. Eyes:  Denies blurring and discharge. ENT:  Denies hoarseness, nasal congestion, sinus pressure, and sore throat. CV:  Denies chest pain or discomfort, palpitations, and swelling of feet. Resp:  Denies cough and sputum productive. GI:  Denies abdominal pain, constipation, diarrhea, nausea, and vomiting. GU:  Denies dysuria and urinary frequency. MS:  Complains of joint pain and stiffness; increased and uncontrolled knee pain and instability, has ortho eval for replacement in the next several days. Neuro:  Complains of headaches;  headache unsteady gait ,blurry vision, slurred speech has fam h/o aneurysm has had in the past , headaches are more frequent and severe. Endo:  Denies cold intolerance, excessive thirst, excessive urination, and heat intolerance. Heme:  Denies abnormal bruising and bleeding. Allergy:  Complains of seasonal allergies.  Physical  Exam  General:  Well-developed,well-nourished,in no acute distress; alert,appropriate and cooperative throughout examination HEENT: No facial asymmetry,  EOMI,nosinus tenderness, TM's Clear, oropharynx  pink and moist.   Chest: clear to ascultation, no crackles or wheezes CVS: S1, S2, No murmurs, No S3.   Abd: Soft, Nontender.  MS: Adequate ROM spine, hips, shoulders and reduced in the  knees, left greater than right, and swollen with tenderness to palpation.  Ext: No edema.   CNS: CN 2-12 intact, power tone and sensation normal throughout.   Skin: Intact, no visible lesions or rashes.  Psych: Good eye contact, normal affect.  Memory intact,  anxious but not  depressed appearing.    Impression & Recommendations:  Problem # 1:  HEADACHE (ICD-784.0) Assessment Deteriorated  The following medications were removed from the medication list:    Indomethacin 25 Mg Caps (Indomethacin) ..... One cap by mouth once daily prn    Propranolol Hcl 80 Mg Tabs (Propranolol hcl) ..... One tab by mouth qd Her updated medication list for this problem includes:    Propranolol Hcl 80 Mg Tabs (Propranolol hcl) .Marland Kitchen... 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 One cap by mouth once daily prn  Problem # 3:  HYPERTENSION (ICD-401.9) Assessment:  Unchanged  The following medications were removed from the medication list:    Amlodipine Besylate 10 Mg Tabs (Amlodipine besylate) ..... One tab by mouth qd    Propranolol Hcl 80 Mg Tabs (Propranolol hcl) ..... One tab by mouth qd Her updated medication list for this problem includes:    Lasix 40 Mg Tabs (Furosemide) ..... One half to one once daily prn    Propranolol Hcl 80 Mg Tabs (Propranolol hcl) .Marland Kitchen... Take 1 tablet by mouth once a day     Amlodipine Besylate 10 Mg Tabs (Amlodipine besylate) .Marland Kitchen... Take 1 tablet by mouth once a day  Orders: T-Basic Metabolic Panel [phone])  BP today: 132/80 Prior BP: 120/82 (09/03/2009)  Labs Reviewed: K+: 4.1 (06/19/2009) Creat: : 1.17 (06/19/2009)   Chol: 186 (06/19/2009)   HDL: 83 (06/19/2009)   LDL: 82 (06/19/2009)   TG: 104 (06/19/2009)  Problem # 4:  OVERWEIGHT/OBESITY (ICD-278.02) Assessment: Deteriorated  Orders: T- Hemoglobin A1C ([id number])  Ht: 64.5 (11/25/2009)   Wt: 205.75 (11/25/2009)   BMI: 34.90 (11/25/2009) therapeutic lifestyle change discussed and encouraged  Complete Medication List: 1)  Topamax 100 Mg Tabs (Topiramate) .... Take one tab two times a day 2)  Temazepam 30 Mg Caps (Temazepam) .... Take one cap by mouth at bedtime 3)  Lasix 40 Mg Tabs (Furosemide) .... One half to one once daily prn 4)  Omeprazole 40 Mg Cpdr (Omeprazole) .... Take 1 capsule by mouth once a day 5)  Meclizine Hcl 25 Mg Tabs (Meclizine hcl) .... Take 1 tablet by mouth three times a day as needed 6)  Propranolol Hcl 80 Mg Tabs (Propranolol hcl) .... Take 1 tablet by mouth once a day 7)  Amlodipine Besylate 10 Mg Tabs (Amlodipine besylate) .... Take 1 tablet by mouth once a day 8)  Levetiracetam 500 Mg Tabs (Levetiracetam) .... Take 1 tablet by mouth two times a day 9)  Imipramine Hcl 50 Mg Tabs (Imipramine hcl) .... Two tablets at bedtime 10)  Ra Potassium Gluconate 595 Mg Tabs (Potassium gluconate) .... Take 1 tablet by mouth once a day  Other Orders: Radiology Referral (Radiology) T-Hepatic Function [phone]) T-Lipid Profile [phone]) T-Vitamin D (25-Hydroxy) [phone])  Patient Instructions: 1)  Please schedule a follow-up appointment in 3.5 months. 2)  STOP simvastatin pls. 3)  Pls take imipramine 25 mg FOUR at night, new med will be 50mg  TWO at night 4)  BMP prior to visit, ICD-9: 5)  Hepatic Panel prior to visit, ICD-9: 6)  Lipid Panel prior to visit,  ICD-9:   fastinfg asap 7)  HbgA1C prior to visit, ICD-9: 8)  Vit D 9)  The medication list was reviewed and reconciled..All changed/newly prescribed medications were explained. A complete medication list was provided to the patient/caregiver.  Prescriptions: PROPRANOLOL HCL 80 MG TABS (PROPRANOLOL HCL) Take 1 tablet by mouth once a day  #90 x 3   Entered and Authorized by:   Syliva Overman MD   Signed by:   Syliva Overman MD on 11/25/2009   Method used:   Historical   RxID:   [id number]     Appended Document: f up   Influenza Vaccine    Vaccine Type: Fluvax MCR    Site: right deltoid    Mfr: novartis    Dose: 0.5 ml    Route: IM    Given by: Adella Hare LPN    Exp. Date: 05/12    Lot #: [id number]    VIS given: 09/09/09 version given November 30, 2009.  shot given at time of Darvin Neighbours LPN  November 30, 2009 2:17 PM

## 2010-03-17 NOTE — Assessment & Plan Note (Signed)
Summary: BLADDER INFECTION  Nurse Visit   Vital Signs:  Patient profile:   70 year old female Menstrual status:  postmenopausal Weight:      207.50 pounds O2 Sat:      96 % Pulse rate:   60 / minute  Vitals Entered By: Adella Hare LPN (January 06, 2010 9:58 AM) Comments complains of burning with urination   Allergies: 1)  ! Sulfa 2)  ! Codeine Laboratory Results   Urine Tests  Date/Time Received: January 06, 2010 9:59 AM  Date/Time Reported: January 06, 2010 9:59 AM   Routine Urinalysis   Color: straw Appearance: Hazy Glucose: 100   (Normal Range: Negative) Bilirubin: small   (Normal Range: Negative) Ketone: trace (5)   (Normal Range: Negative) Spec. Gravity: 1.015   (Normal Range: 1.003-1.035) Blood: negative   (Normal Range: Negative) pH: 7.0   (Normal Range: 5.0-8.0) Protein: 30   (Normal Range: Negative) Urobilinogen: 1.0   (Normal Range: 0-1) Nitrite: positive   (Normal Range: Negative) Leukocyte Esterace: small   (Normal Range: Negative)       Orders Added: 1)  Urinalysis [81003-65000] 2)  T-Culture, Urine [[id number]] Prescriptions: CIPROFLOXACIN HCL 500 MG TABS (CIPROFLOXACIN HCL) one tab by mouth two times a day  #10 x 0   Entered by:   Adella Hare LPN   Authorized by:   Syliva Overman MD   Signed by:   Adella Hare LPN on 29/52/8413   Method used:   Electronically to        [facility] [address] [phone]* (retail)       [address] [place], [place]  [address]       Ph: [phone]       Fax: [phone]   RxID:   [id number]   Laboratory Results   Urine Tests    Routine Urinalysis   Color: straw Appearance: Hazy Glucose: 100   (Normal Range: Negative) Bilirubin: small   (Normal Range: Negative) Ketone: trace (5)   (Normal Range: Negative) Spec. Gravity: 1.015   (Normal Range: 1.003-1.035) Blood: negative   (Normal Range: Negative) pH: 7.0   (Normal Range: 5.0-8.0) Protein: 30   (Normal Range:  Negative) Urobilinogen: 1.0   (Normal Range: 0-1) Nitrite: positive   (Normal Range: Negative) Leukocyte Esterace: small   (Normal Range: Negative)       Appended Document: BLADDER INFECTION reviewd at nurse visit and order given for 5 day course of ciprofloxacin.

## 2010-03-17 NOTE — Assessment & Plan Note (Signed)
Summary: OV   Vital Signs:  Patient profile:   70 year old female Menstrual status:  postmenopausal Height:      64.5 inches Weight:      201.50 pounds BMI:     34.18 O2 Sat:      97 % Pulse rate:   80 / minute Pulse rhythm:   regular Resp:     16 per minute BP sitting:   120 / 82  (left arm) Cuff size:   large  Vitals Entered By: Everitt Amber LPN (September 03, 2009 10:43 AM)  Nutrition Counseling: Patient's BMI is greater than 25 and therefore counseled on weight management options. CC: burning on urination, and also itchng all over  Comments U/A positive   CC:  burning on urination and and also itchng all over .  History of Present Illness: Pt has been itching all over since the past 5 days, no fever chills rash or drainage. She ALSO HASA RECENT H/O DYSURIA AND FREQUENCY WITH MALODOROS URINE. sHE DENIES FEVER , CHILLS, FLANK PAIN  OR HEMATURIA. Reports  that prior to this she had n Denies recent fever or chills.been doing fairly well.. Denies sinus pressure, nasal congestion , ear pain or sore throat. Denies chest congestion, or cough productive of sputum. Denies chest pain, palpitations, PND, orthopnea or leg swelling. Denies abdominal pain, nausea, vomitting, diarrhea or constipation. Denies change in bowel movements or bloody stool.  Denies headaches, vertigo, seizures. Denies uncontrolled depression, anxiety or insomnia. Denies  rash or , lesions,but has generalised  itch.       Current Medications (verified): 1)  Imipramine Hcl 25 Mg  Tabs (Imipramine Hcl) .... Four Tabs By Mouth At Bedtime 2)  Topamax 100 Mg Tabs (Topiramate) .... Take One Tab Two Times A Day 3)  Temazepam 30 Mg Caps (Temazepam) .... Take One Cap By Mouth At Bedtime 4)  Indomethacin 25 Mg Caps (Indomethacin) .... One Cap By Mouth Once Daily Prn 5)  Amlodipine Besylate 10 Mg Tabs (Amlodipine Besylate) .... One Tab By Mouth Qd 6)  Lasix 40 Mg Tabs (Furosemide) .... One Half To One Once Daily  Prn 7)  Simvastatin 20 Mg Tabs (Simvastatin) .... Take 1 Tab By Mouth At Bedtime 8)  Propranolol Hcl 80 Mg Tabs (Propranolol Hcl) .... One Tab By Mouth Qd 9)  Levetiracetam 500 Mg Tabs (Levetiracetam) .... One Tab By Mouth Bid 10)  Omeprazole 40 Mg Cpdr (Omeprazole) .... Take 1 Capsule By Mouth Once A Day 11)  Meclizine Hcl 25 Mg Tabs (Meclizine Hcl) .... Take 1 Tablet By Mouth Three Times A Day As Needed 12)  Vitamin D (Ergocalciferol) 50000 Unit Caps (Ergocalciferol) .... One Tab Once A Week  Allergies (verified): 1)  ! Sulfa 2)  ! Codeine  Review of Systems      See HPI General:  Complains of fatigue; denies chills and fever. Eyes:  Denies blurring, discharge, eye pain, and red eye. GU:  Complains of dysuria and urinary frequency. MS:  Complains of joint pain and stiffness. Derm:  Complains of itching; denies lesion(s) and rash. Neuro:  Complains of headaches; denies seizures and sensation of room spinning. Psych:  Complains of anxiety and depression; denies sense of great danger, suicidal thoughts/plans, thoughts of violence, and unusual visions or sounds; controlled well on m,eds. Endo:  Denies cold intolerance, excessive hunger, excessive thirst, excessive urination, heat intolerance, polyuria, and weight change. Heme:  Denies abnormal bruising and bleeding. Allergy:  Complains of seasonal allergies; denies hives or rash  and itching eyes.  Physical Exam  General:  Well-developed,well-nourished,in no acute distress; alert,appropriate and cooperative throughout examination HEENT: No facial asymmetry,  EOMI,nosinus tenderness, TM's Clear, oropharynx  pink and moist.   Chest: clear to ascultation, no crackles or wheezes CVS: S1, S2, No murmurs, No S3.   Abd: Soft, Nontender.  MS: Adequate ROM spine, hips, shoulders and reduced in the  knees, left greater than right, and swollen with tenderness to palpation.  Ext: No edema.   CNS: CN 2-12 intact, power tone and sensation normal  throughout.   Skin: Intact, no visible lesions or rashes.  Psych: Good eye contact, normal affect.  Memory intact,  anxious but not  depressed appearing.    Impression & Recommendations:  Problem # 1:  PRURITUS (ICD-698.9) Assessment Deteriorated  Orders: Depo- Medrol 80mg  (J1040) Benadryl  IM or IV (J1200) Admin of Therapeutic Inj  intramuscular or subcutaneous ([address])  Problem # 2:  ACUTE CYSTITIS (ICD-595.0) Assessment: Comment Only  The following medications were removed from the medication list:    Doxycycline Hyclate 100 Mg Caps (Doxycycline hyclate) .Marland Kitchen... Take 1 capsule by mouth two times a day    Ciprofloxacin Hcl 500 Mg Tabs (Ciprofloxacin hcl) .Marland Kitchen... Take 1 tablet by mouth two times a day Her updated medication list for this problem includes:    Nitrofurantoin Macrocrystal 100 Mg Caps (Nitrofurantoin macrocrystal) .Marland Kitchen... 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 One cap by mouth once daily prn  Problem # 4:  DEPRESSION (ICD-311) Assessment: Improved  Her updated medication list for this problem includes:    Imipramine Hcl 25 Mg Tabs (Imipramine hcl) .Marland Kitchen... Four tabs by mouth at bedtime    Hydroxyzine Hcl 25 Mg Tabs (Hydroxyzine hcl) .Marland Kitchen... Take 1 tablet by mouth three times a day as needed  Problem # 5:  HYPERTENSION (ICD-401.9) Assessment: Unchanged  Her updated medication list for this problem includes:    Amlodipine Besylate 10 Mg Tabs (Amlodipine besylate) ..... One tab by mouth qd    Lasix 40 Mg Tabs (Furosemide) ..... One half to one once daily prn    Propranolol Hcl 80 Mg Tabs (Propranolol hcl) ..... One tab by mouth qd  BP today: 120/82 Prior BP: 120/84 (06/09/2009)  Labs Reviewed: K+: 4.1  (06/19/2009) Creat: : 1.17 (06/19/2009)   Chol: 186 (06/19/2009)   HDL: 83 (06/19/2009)   LDL: 82 (06/19/2009)   TG: 104 (06/19/2009)  Problem # 6:  HYPERLIPIDEMIA (ICD-272.4) Assessment: Unchanged  Her updated medication list for this problem includes:    Simvastatin 20 Mg Tabs (Simvastatin) .Marland Kitchen... Take 1 tab by mouth at bedtime  Labs Reviewed: SGOT: 14 (06/19/2009)   SGPT: 12 (06/19/2009)   HDL:83 (06/19/2009), 75 (02/03/2009)  LDL:82 (06/19/2009), 71 (02/03/2009)  Chol:186 (06/19/2009), 162 (02/03/2009)  Trig:104 (06/19/2009), 82 (02/03/2009)  Complete Medication List: 1)  Imipramine Hcl 25 Mg Tabs (Imipramine hcl) .... Four tabs by mouth at bedtime 2)  Topamax 100 Mg Tabs (Topiramate) .... Take one tab two times a day 3)  Temazepam 30 Mg Caps (Temazepam) .... Take one cap by mouth at bedtime 4)  Indomethacin 25 Mg Caps (Indomethacin) .... One cap by mouth once daily prn 5)  Amlodipine Besylate 10 Mg Tabs (Amlodipine besylate) .... One tab by mouth qd  6)  Lasix 40 Mg Tabs (Furosemide) .... One half to one once daily prn 7)  Simvastatin 20 Mg Tabs (Simvastatin) .... Take 1 tab by mouth at bedtime 8)  Propranolol Hcl 80 Mg Tabs (Propranolol hcl) .... One tab by mouth qd 9)  Levetiracetam 500 Mg Tabs (Levetiracetam) .... One tab by mouth bid 10)  Omeprazole 40 Mg Cpdr (Omeprazole) .... Take 1 capsule by mouth once a day 11)  Meclizine Hcl 25 Mg Tabs (Meclizine hcl) .... Take 1 tablet by mouth three times a day as needed 12)  Vitamin D (ergocalciferol) 50000 Unit Caps (Ergocalciferol) .... One tab once a week 13)  Fluconazole 150 Mg Tabs (Fluconazole) .... Take 1 tablet by mouth once a day 14)  Hydroxyzine Hcl 25 Mg Tabs (Hydroxyzine hcl) .... Take 1 tablet by mouth three times a day as needed 15)  Prednisone (pak) 5 Mg Tabs (Prednisone) .... Use as directed 16)  Nitrofurantoin Macrocrystal 100 Mg Caps (Nitrofurantoin macrocrystal) .... Take 1 capsule by mouth two times a day  Other  Orders: Radiology Referral (Radiology)  Patient Instructions: 1)  Please schedule a follow-up appointment in 2 months. 2)  It is important that you exercise regularly at least 20 minutes 5 times a week. If you develop chest pain, have severe difficulty breathing, or feel very tired , stop exercising immediately and seek medical attention. 3)  You need to lose weight. Consider a lower calorie diet and regular exercise.  4)  You will be receiving injections for itch, and med will be sent in also. 5)  You will have med for UTI 6)  Youi will get injections in the office for itch. 7)  Use aveeno soap, and also calamine lotion on the skin will soothe it till youbfeel better. Prescriptions: NITROFURANTOIN MACROCRYSTAL 100 MG CAPS (NITROFURANTOIN MACROCRYSTAL) Take 1 capsule by mouth two times a day  #14 x 0   Entered and Authorized by:   Syliva Overman MD   Signed by:   Syliva Overman MD on 09/07/2009   Method used:   Historical   RxID:   [id number] PROPRANOLOL HCL 80 MG TABS (PROPRANOLOL HCL) one tab by mouth qd  #90 x 3   Entered by:   Everitt Amber LPN   Authorized by:   Syliva Overman MD   Signed by:   Everitt Amber LPN on 84/69/6295   Method used:   Faxed to ...       [facility] (mail-order)             , [place]         Ph: [phone]       Fax: [phone]   RxID:   [id number] TOPAMAX 100 MG TABS (TOPIRAMATE) Take one tab two times a day  #180 x 3   Entered by:   Everitt Amber LPN   Authorized by:   Syliva Overman MD   Signed by:   Everitt Amber LPN on 33/29/5188   Method used:   Faxed to ...       [facility] (mail-order)             , [place]         Ph: [phone]       Fax: [phone]   RxID:   [id number] SIMVASTATIN 20 MG TABS (SIMVASTATIN) Take 1 tab by mouth at bedtime  #90 x 3   Entered by:   Everitt Amber LPN   Authorized by:   Syliva Overman MD  Signed by:   Everitt Amber LPN on 57/84/6962   Method used:   Faxed to ...       [facility] (mail-order)             , [place]         Ph: [phone]       Fax: [phone]   RxID:   [id number] AMLODIPINE BESYLATE 10 MG TABS (AMLODIPINE BESYLATE) one tab by mouth qd  #90 x 3   Entered by:   Everitt Amber LPN   Authorized by:   Syliva Overman MD   Signed by:   Everitt Amber LPN on 64/33/2951   Method used:   Faxed to ...       [facility] (mail-order)             , [place]         Ph: [phone]       Fax: [phone]   RxID:   [id number] IMIPRAMINE HCL 25 MG  TABS (IMIPRAMINE HCL) four tabs by mouth at bedtime  #360 x 3   Entered by:   Everitt Amber LPN   Authorized by:   Syliva Overman MD   Signed by:   Everitt Amber LPN on 62/83/1517   Method used:   Faxed to ...       [facility] (mail-order)             , [place]         Ph: [phone]       Fax: [phone]   RxID:   [id number] PREDNISONE (PAK) 5 MG TABS (PREDNISONE) Use as directed  #21 x 0   Entered and Authorized by:   Syliva Overman MD   Signed by:   Syliva Overman MD on 09/03/2009   Method used:   Electronically to        [facility] [address] [phone]* (retail)       [address]       [place], [place]  [address]       Ph: [phone] or [phone]       Fax: [phone]   RxID:   [id number] HYDROXYZINE HCL 25 MG TABS (HYDROXYZINE HCL) Take 1 tablet by mouth three times a day as needed  #30 x 0   Entered and Authorized by:   Syliva Overman MD   Signed by:   Syliva Overman MD on 09/03/2009   Method used:   [facility] to        [facility] [phone]* (retail)       [address]       [place], [place]  [address]       Ph: [phone] or [phone]       Fax: [phone]   RxID:   [id number] FLUCONAZOLE 150 MG TABS (FLUCONAZOLE) Take 1 tablet by mouth once a day  #1 x 0   Entered and Authorized by:   Syliva Overman MD   Signed by:   Syliva Overman MD on 09/03/2009   Method used:   [facility] to        [facility] [phone]* (retail)       [address]       [place], [place]  [address]       Ph: [phone] or [phone]  Fax: [phone]   RxID:   [id number] CIPROFLOXACIN HCL 500 MG TABS (CIPROFLOXACIN HCL) Take 1 tablet by mouth two times a day  #10 x 0   Entered and Authorized by:   Syliva Overman MD   Signed by:   Syliva Overman MD on 09/03/2009   Method used:   [facility] to        [facility] [phone]* (retail)       [address]       [place], [place]  [address]       Ph: [phone] or [phone]       Fax: [phone]   RxID:   [id number]   Laboratory Results   Urine Tests    Routine Urinalysis   Color: yellow Appearance: Clear Glucose: negative   (Normal Range: Negative) Bilirubin: negative   (Normal Range: Negative) Ketone: negative   (Normal Range: Negative) Spec. Gravity: 1.015   (Normal Range: 1.003-1.035) Blood: negative   (Normal Range: Negative) pH: 7.0   (Normal Range: 5.0-8.0) Protein: negative   (Normal Range: Negative) Urobilinogen: 0.2   (Normal Range: 0-1) Nitrite: negative   (Normal Range: Negative) Leukocyte Esterace: small   (Normal Range: Negative)        Medication Administration  Injection # 1:    Medication: Depo- Medrol 80mg     Diagnosis: PRURITUS (ICD-698.9)    Route: IM    Site: RUOQ gluteus    Exp Date: 05/2010    Lot #: [id number]    Mfr: Pharmacia    Comments: 80mg  given    Patient tolerated injection without complications    Given by: Everitt Amber LPN (September 03, 2009 12:41 PM)  Injection # 2:    Medication: Benadryl  IM or IV    Diagnosis: PRURITUS (ICD-698.9)    Route: IM    Site: LUOQ gluteus    Exp Date: 02/2011    Lot #: [id number]    Mfr: baxter    Comments: 25mg  given     Patient tolerated injection without complications    Given by: Everitt Amber LPN (September 03, 2009 12:41 PM)  Orders Added: 1)  UA Dipstick W/ Micro (manual) [81000] 2)  T-Culture, Urine  [60630-16010] 3)  Est. Patient Level IV [93235] 4)  Radiology Referral [Radiology] 5)  Depo- Medrol 80mg  [J1040] 6)  Benadryl  IM or IV [J1200] 7)  Admin of Therapeutic Inj  intramuscular or subcutaneous [57322]

## 2010-03-17 NOTE — Miscellaneous (Signed)
  Clinical Lists Changes  Medications: Added new medication of DOXYCYCLINE HYCLATE 100 MG CAPS (DOXYCYCLINE HYCLATE) Take 1 capsule by mouth two times a day - Signed Rx of DOXYCYCLINE HYCLATE 100 MG CAPS (DOXYCYCLINE HYCLATE) Take 1 capsule by mouth two times a day;  #20 x 0;  Signed;  Entered by: Syliva Overman MD;  Authorized by: Syliva Overman MD;  Method used: [facility] to [facility] [phone]*, [address] [place], [place], [place]  [address], Ph: [phone] or [phone], Fax: [phone]    Prescriptions: DOXYCYCLINE HYCLATE 100 MG CAPS (DOXYCYCLINE HYCLATE) Take 1 capsule by mouth two times a day  #20 x 0   Entered and Authorized by:   Syliva Overman MD   Signed by:   Syliva Overman MD on 06/16/2009   Method used:   Electronically to        [facility] [phone]* (retail)       [address]       [place], [place]  [address]       Ph: [phone] or [phone]       Fax: [phone]   RxID:   [id number]  pls let [name] know that an antibiotic has been sent in for her ear, [facility] pls verify with her  [facility] is [facility]

## 2010-03-17 NOTE — Miscellaneous (Signed)
Summary: LABS  Clinical Lists Changes  Problems: Added new problem of FATIGUE (ICD-780.79) Added new problem of SPECIAL SCREENING FOR OSTEOPOROSIS (ICD-V82.81) Added new problem of SCREENING EXAMINATION FOR VENEREAL DISEASE (ICD-V74.5) Orders: Added new Test order of T-CBC w/Diff [phone]) - Signed Added new Test order of HSV 2- [facility] [phone]) - Signed Added new Test order of T-TSH [phone]) - Signed Added new Test order of T-Vitamin D (25-Hydroxy) [phone]) - Signed Added new Test order of T-Hepatic Function [phone]) - Signed Added new Test order of T-Lipid Profile ([phone]) - Signed Added new Test order of T-Basic Metabolic Panel [phone]) - Signed Added new Test order of T- Hemoglobin A1C ([phone]) - Signed

## 2010-03-17 NOTE — Letter (Signed)
Summary: pre cert  pre cert   Imported By: Lind Guest 12/10/2009 13:47:27  _____________________________________________________________________  External Attachment:    Type:   Image     Comment:   External Document

## 2010-03-17 NOTE — Progress Notes (Signed)
  Phone Note Call from Patient   Caller: Patient Summary of Call: c/o coughing all night, just completed abiotic course Initial call taken by: Syliva Overman MD,  February 21, 2009 8:07 AM  Follow-up for Phone Call        pls erx apothecary syrup 5cc evry 6 hrs as needed x 8 ounces and let her know , to [place] [facility] Follow-up by: Syliva Overman MD,  February 21, 2009 8:08 AM  Additional Follow-up for Phone Call Additional follow up Details #1::        rx called in to [place] and pt aware Additional Follow-up by: Everitt Amber,  February 21, 2009 11:09 AM    New/Updated Medications: * APOTHECARY SYRUP 5cc q 6 hrs as needed as needed Prescriptions: APOTHECARY SYRUP 5cc q 6 hrs as needed as needed  #8oz x 0   Entered by:   Everitt Amber   Authorized by:   Syliva Overman MD   Signed by:   Everitt Amber on 02/21/2009   Method used:   [facility] to ...       [facility]  [address] [phone][facility] (retail)       [address]       [place], [place]  [address]       Ph: [phone] or [phone]       Fax: [phone]   RxID:   [id number]

## 2010-03-17 NOTE — Progress Notes (Signed)
Summary: APPT  Phone Note Call from Patient   Summary of Call: NEEDS INFORMATION ON DR. Helaine Chess NEEDS THE APPT FOR MAY AFTER THE 3RD CALL BACK Initial call taken by: Lind Guest,  June 09, 2009 3:26 PM  Follow-up for Phone Call        pt was called and left message that she  had appt with dr. Eulah Pont on friday.  Follow-up by: Rudene Anda,  June 10, 2009 1:13 PM

## 2010-03-17 NOTE — Assessment & Plan Note (Signed)
Summary: f up   Vital Signs:  Patient profile:   70 year old female Menstrual status:  postmenopausal Height:      64.5 inches Weight:      202 pounds O2 Sat:      96 % Pulse rate:   73 / minute Pulse rhythm:   regular Resp:     16 per minute BP sitting:   120 / 84  (left arm) Cuff size:   large  Vitals Entered By: Everitt Amber LPN (June 09, 2009 8:35 AM) CC: left ear has been hurting and sometimes when she chews it bothers her   CC:  left ear has been hurting and sometimes when she chews it bothers her.  History of Present Illness: pt c/o invcreeased left knee pain and swelling, wants ortho eval.Also requests shot todAY. sHE C/O LEFT JAW PAIN AGGRAVATED BY PRESSURE FORMONTHS, ALSO LEFT EAR FULLNESS AND DISCOMFORT FOR APPROX 2 WEEKS. sHE DENIES HEAD OIR CHEST CONGESTION, BUT DOESHAVE INC POST NASAL DRAINAGE SINCE THE ALLERGY SEASON IS IN FULL SWING. She has not tested her sugar for over 1 yr, has o meter and her HBA1c qwhen tested has been normal. she is concerned about weight gailn and plans to address this.  Current Medications (verified): 1)  Imipramine Hcl 25 Mg  Tabs (Imipramine Hcl) .... Four Tabs By Mouth At Bedtime 2)  Topamax 100 Mg Tabs (Topiramate) .... Take One Tab Two Times A Day 3)  Temazepam 30 Mg Caps (Temazepam) .... Take One Cap By Mouth At Bedtime 4)  Indomethacin 25 Mg Caps (Indomethacin) .... One Cap By Mouth Once Daily Prn 5)  Amlodipine Besylate 10 Mg Tabs (Amlodipine Besylate) .... One Tab By Mouth Qd 6)  Lasix 40 Mg Tabs (Furosemide) .... One Half To One Once Daily Prn 7)  Simvastatin 20 Mg Tabs (Simvastatin) .... Take 1 Tab By Mouth At Bedtime 8)  Propranolol Hcl 80 Mg Tabs (Propranolol Hcl) .... One Tab By Mouth Qd 9)  Levetiracetam 500 Mg Tabs (Levetiracetam) .... One Tab By Mouth Bid 10)  Omeprazole 40 Mg Cpdr (Omeprazole) .... Take 1 Capsule By Mouth Once A Day  Allergies (verified): 1)  ! Sulfa 2)  ! Codeine  Review of Systems      See  HPI General:  Complains of fatigue; denies chills and fever. Eyes:  Denies blurring, discharge, eye pain, and red eye. ENT:  Complains of earache; left ear pain associated with chewing getting worse she does chew excessivelyand will stop. CV:  Denies chest pain or discomfort, palpitations, and swelling of feet. Resp:  Denies cough and sputum productive. GI:  Denies abdominal pain, constipation, diarrhea, nausea, and vomiting. GU:  Denies dysuria, incontinence, and urinary frequency. MS:  Complains of joint pain, low back pain, mid back pain, and stiffness; inc left knee pain and instrability. Derm:  Denies itching, lesion(s), and rash. Neuro:  Complains of headaches; denies seizures and sensation of room spinning; states headaches have improved, concerned about med cost, wants generic immitrexc. Psych:  Complains of anxiety and depression; denies mental problems, panic attacks, suicidal thoughts/plans, thoughts of violence, and unusual visions or sounds; improved with meds. Endo:  Denies cold intolerance, excessive hunger, excessive thirst, excessive urination, heat intolerance, polyuria, and weight change; stopped testing  over 1 yr. Heme:  Denies abnormal bruising and bleeding. Allergy:  Complains of seasonal allergies.  Physical Exam  General:  Well-developed,well-nourished,in no acute distress; alert,appropriate and cooperative throughout examination HEENT: No facial asymmetry,  EOMI,positive sinus tenderness,  TM's Cle[place] reight , dull on left with fluid, oropharynx  pink and moist. Right TMJ tnder to palpation  Chest: decreased airentry bilaterally, scattered crackles and wheezes CVS: S1, S2, No murmurs, No S3.   Abd: Soft, Nontender.  MS: Adequate ROM spine, hips, shoulders and reduced in the  knees, left greater than right, and swollen with tenderness to palpation.  Ext: No edema.   CNS: CN 2-12 intact, power tone and sensation normal throughout.   Skin: Intact, no visible lesions  or rashes.  Psych: Good eye contact, normal affect.  Memory intact,  anxious but not  depressed appearing.    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 One cap by mouth once daily prn  Orders: Ketorolac-Toradol 15mg  (W1191) Admin of Therapeutic Inj  intramuscular or subcutaneous (47829) Orthopedic Referral (Ortho)  Problem # 2:  DIABETES MELLITUS (ICD-250.00) Assessment: Comment Only  Orders: Glucose, (CBG) (56213)  Labs Reviewed: Creat: 0.95 (02/03/2009)    Reviewed HgBA1c results: 6.1 (01/21/2009)  6.3 (10/22/2008)  Problem # 3:  HYPERTENSION (ICD-401.9) Assessment: Improved  Her updated medication list for this problem includes:    Amlodipine Besylate 10 Mg Tabs (Amlodipine besylate) ..... One tab by mouth qd    Lasix 40 Mg Tabs (Furosemide) ..... One half to one once daily prn    Propranolol Hcl 80 Mg Tabs (Propranolol hcl) ..... One tab by mouth qd  BP today:120/84 Prior BP: 124/90 (02/03/2009)  Labs Reviewed: K+: 4.2 (02/03/2009) Creat: : 0.95 (02/03/2009)   Chol: 162 (02/03/2009)   HDL: 75 (02/03/2009)   LDL: 71 (02/03/2009)   TG: 82 (02/03/2009)  Problem # 4:  HYPERLIPIDEMIA (ICD-272.4) Assessment: Comment Only  Her updated medication list for this problem includes:    Simvastatin 20 Mg Tabs (Simvastatin) .Marland Kitchen... Take 1 tab by mouth at bedtime  Labs Reviewed: SGOT: 18 (02/03/2009)   SGPT: 18 (02/03/2009)   HDL:75 (02/03/2009), 80 (06/17/2008)  LDL:71 (02/03/2009), 72 (06/17/2008)  Chol:162 (02/03/2009), 173 (06/17/2008)  Trig:82 (02/03/2009), 107 (06/17/2008)  Problem # 5:  OVERWEIGHT/OBESITY (ICD-278.02) Assessment: Deteriorated  Ht: 64.5 (06/09/2009)   Wt: 202 (06/09/2009)   BMI: 33.24 (02/03/2009)  Problem # 6:  MIGRAINE HEADACHE (ICD-346.90) Assessment: Improved  Her updated  medication list for this problem includes:    Indomethacin 25 Mg Caps (Indomethacin) ..... One cap by mouth once daily prn    Propranolol Hcl 80 Mg Tabs (Propranolol hcl) ..... One tab by mouth qd kepra for prophylaxis, p[t has also been given a script for immitrex at her request  Problem # 7:  LOM (ICD-382.9) Assessment: Comment Only  The following medications were removed from the medication list:    Ciprofloxacin Hcl 500 Mg Tabs (Ciprofloxacin hcl) .Marland Kitchen... Take 1 tablet by mouth two times a day Her updated medication list for this problem includes:    Indomethacin 25 Mg Caps (Indomethacin) ..... One cap by mouth once daily prn doxycycline rx gioven  Problem # 8:  TMJ SYNDROME (ICD-524.60) Assessment: Comment Only advised cool compress and anti-inflammatory use as needed and reduce chewing  Problem # 9:  INTERMITTENT VERTIGO (ICD-780.4) Assessment: Unchanged  Her updated medication list for this problem includes:    Meclizine Hcl 25 Mg Tabs (Meclizine hcl) .Marland Kitchen... Take 1 tablet by mouth three times a day as needed  Complete Medication List: 1)  Imipramine Hcl 25 Mg Tabs (Imipramine  hcl) .... Four tabs by mouth at bedtime 2)  Topamax 100 Mg Tabs (Topiramate) .... Take one tab two times a day 3)  Temazepam 30 Mg Caps (Temazepam) .... Take one cap by mouth at bedtime 4)  Indomethacin 25 Mg Caps (Indomethacin) .... One cap by mouth once daily prn 5)  Amlodipine Besylate 10 Mg Tabs (Amlodipine besylate) .... One tab by mouth qd 6)  Lasix 40 Mg Tabs (Furosemide) .... One half to one once daily prn 7)  Simvastatin 20 Mg Tabs (Simvastatin) .... Take 1 tab by mouth at bedtime 8)  Propranolol Hcl 80 Mg Tabs (Propranolol hcl) .... One tab by mouth qd 9)  Levetiracetam 500 Mg Tabs (Levetiracetam) .... One tab by mouth bid 10)  Omeprazole 40 Mg Cpdr (Omeprazole) .... Take 1 capsule by mouth once a day 11)  Meclizine Hcl 25 Mg Tabs (Meclizine hcl) .... Take 1 tablet by mouth three times a day as  needed  Patient Instructions: 1)  F/U in middle October 2)  It is important that you exercise regularly at least 20 minutes 5 times a week. If you develop chest pain, have severe difficulty breathing, or feel very tired , stop exercising immediately and seek medical attention. 3)  You need to lose weight. Consider a lower calorie diet and regular exercise.  4)  meds are sent in as discussed.Marland Kitchen 5)  you will be treated for left ear infection . 6)  you will be refered to dr Eulah Pont about your knee and get an injection for it also Prescriptions: MECLIZINE HCL 25 MG TABS (MECLIZINE HCL) Take 1 tablet by mouth three times a day as needed  #42 x 3   Entered and Authorized by:   Syliva Overman MD   Signed by:   Syliva Overman MD on 06/09/2009   Method used:   [facility] to        [facility] [phone]* (retail)       [address]       [place], [place]  [address]       Ph: [phone] or [phone]       Fax: [phone]   RxID:   [id number]   Laboratory Results   Blood Tests     Glucose (fasting): 134 mg/dL   (Normal Range: 32-202)      Medication Administration  Injection # 1:    Medication: Ketorolac-Toradol 15mg     Diagnosis: OSTEOARTHRITIS, KNEE, LEFT (ICD-715.96)    Route: IM    Site: LUOQ gluteus    Exp Date: 01/2011    Lot #: [id number]     Mfr: [email or web address]    Comments: 60mg  given     Patient tolerated injection without complications    Given by: Everitt Amber LPN (June 09, 2009 9:56 AM)  Orders Added: 1)  Glucose, (CBG) [82962] 2)  Est. Patient Level IV [54270] 3)  Ketorolac-Toradol 15mg  [J1885] 4)  Admin of Therapeutic Inj  intramuscular or subcutaneous [96372] 5)  Orthopedic Referral [Ortho]

## 2010-03-17 NOTE — Miscellaneous (Signed)
Summary: pre Berkley Harvey for mri and mra  Prior Auth # for mri and mra with contrast [id number] Adella Hare LPN  December 09, 2009 2:56 PM

## 2010-03-17 NOTE — Progress Notes (Signed)
Summary: dr. Eulah Pont referral  Phone Note Call from Patient   Summary of Call: pt has appt with dr. Helaine Chess on 11/28/2009 10:30. pt notified  Initial call taken by: Rudene Anda,  November 18, 2009 4:12 PM

## 2010-03-17 NOTE — Letter (Signed)
Summary: 1st Missed Appt.  [facility]  [address]   [place], [place] [address]   Phone: [phone]  Fax: [phone]    March 27, 2009  MRN: [id number]  [name] [address] [place], [place]  [address]  Dear [name],  At [facility], we make every attempt to fit patients into our schedule by reserving several appointment slots for same-day appointments.  However, we cannot always make appointments for patients the same day they are calling.  At the end of the day, we look back at our schedule and find that because of last-minute cancellations and patients not showing up for their scheduled appointments, we have several appointment slots that are left open and could have been used by another person who really needed it.  In the past, you may have been one of the patients who could not get in when you needed to.  But recently, you were one of the patients with an appointment that you didn't show up for or canceled too late for [place] to fill it.  We choose not to charge no-show or last minute cancellation fees to our patients, like many other offices do.  We do not wish to institute that policy and hope we never have to.  However, we kindly request that you assist [place] by providing at least 24 hours' notice if you can't make your appointment.  If no-shows or late cancellations become habitual (i.e. Three or more in a one-year period), we may terminate the physician-patient relationship.    Thank you for your consideration and cooperation.   Altamease Oiler

## 2010-03-17 NOTE — Letter (Signed)
Summary: med review sheets  med review sheets   Imported By: Rudene Anda 11/25/2009 15:44:51  _____________________________________________________________________  External Attachment:    Type:   Image     Comment:   External Document

## 2010-03-17 NOTE — Assessment & Plan Note (Signed)
Summary: injection  Nurse Visit   Vital Signs:  Patient profile:   70 year old female Menstrual status:  postmenopausal Height:      64.5 inches Weight:      201.50 pounds BMI:     34.18 O2 Sat:      98 % on Room air Pulse (ortho):   74 / minute Resp:     16 per minute  Vitals Entered By: Everitt Amber LPN (September 08, 2009 9:58 AM)  O2 Flow:  Room air CC: Patient in to get rocephin 500mg  injection for UTI   Allergies: 1)  ! Sulfa 2)  ! Codeine  Medication Administration  Injection # 1:    Medication: Rocephin  250mg     Diagnosis: ACUTE CYSTITIS (ICD-595.0)    Route: IM    Site: RUOQ gluteus    Exp Date: 02/2011    Lot #: [id number]    Mfr: sandoz    Comments: 500mg  given     Patient tolerated injection without complications    Given by: Everitt Amber LPN (September 08, 2009 10:00 AM)  Orders Added: 1)  Rocephin  250mg  [J0696] 2)  Admin of Therapeutic Inj  intramuscular or subcutaneous [96372]   Medication Administration  Injection # 1:    Medication: Rocephin  250mg     Diagnosis: ACUTE CYSTITIS (ICD-595.0)    Route: IM    Site: RUOQ gluteus    Exp Date: 02/2011    Lot #: [id number]    Mfr: sandoz    Comments: 500mg  given     Patient tolerated injection without complications    Given by: Everitt Amber LPN (September 08, 2009 10:00 AM)  Orders Added: 1)  Rocephin  250mg  [J0696] 2)  Admin of Therapeutic Inj  intramuscular or subcutaneous [16109]

## 2010-03-17 NOTE — Progress Notes (Signed)
  Phone Note Outgoing Call   Call placed by: dr simpson Summary of Call: advised pt her infectio is resitant,nitrofurantoin is being sent Initial call taken by: Syliva Overman MD,  January 10, 2010 10:25 AM    New/Updated Medications: NITROFURANTOIN MACROCRYSTAL 100 MG CAPS (NITROFURANTOIN MACROCRYSTAL) Take 1 capsule by mouth two times a day Prescriptions: NITROFURANTOIN MACROCRYSTAL 100 MG CAPS (NITROFURANTOIN MACROCRYSTAL) Take 1 capsule by mouth two times a day  #14 x 0   Entered and Authorized by:   Syliva Overman MD   Signed by:   Syliva Overman MD on 01/10/2010   Method used:   Electronically to        [facility] [address] [phone]* (retail)       [address], [place]  [address]       Ph: [phone]       Fax: [phone]   RxID:   [id number]

## 2010-03-19 NOTE — Progress Notes (Signed)
  Phone Note Call from Patient   Caller: Patient Summary of Call: GOT DIZZY AND FELL YESTERDAY, HIT HEAD ON THE FLOOR, ARM AND NECK ARE HURTING  ADVISED PATIENT ER FOR EVAL, ADVISED PATIENT SHE REALLY NEEDS SOME XRAYS TO ENSURE NO FRACTURE OR HEAD INJURY AND WE CANNOT DO THOSE TESTS HERE Initial call taken by: Adella Hare LPN,  February 12, 2010 11:31 AM  Follow-up for Phone Call        notged and agree Follow-up by: Syliva Overman MD,  February 12, 2010 11:57 AM

## 2010-03-19 NOTE — Assessment & Plan Note (Signed)
Summary: office visit   Vital Signs:  Patient profile:   70 year old female Menstrual status:  postmenopausal Height:      64.5 inches Weight:      212.50 pounds BMI:     36.04 O2 Sat:      98 % Pulse rate:   76 / minute Pulse rhythm:   regular Resp:     16 per minute BP sitting:   140 / 82  (left arm)  Vitals Entered By: Everitt Amber LPN (February 26, 2010 4:37 PM)  Nutrition Counseling: Patient's BMI is greater than 25 and therefore counseled on weight management options. CC: Follow up from er, inner ear made her dizzy right after christmas and she fell   CC:  Follow up from er and inner ear made her dizzy right after christmas and she fell.  History of Present Illness: Reports  that she has not been doing well. She was recently in the ed with inner ear which was severe enough that she fell. She still has to find an orhtopod who i in network so that she can have her right knee surgery. her spouse was very ill recently and was hospitalised. Denies sinus pressure, nasal congestion , ear pain or sore throat. Denies chest congestion, or cough productive of sputum. Denies chest pain, palpitations, PND, orthopnea or leg swelling. Denies abdominal pain, nausea, vomitting, diarrhea or constipation. Denies change in bowel movements or bloody stool. Denies dysuria , frequency, incontinence or hesitancy.  Denies uncontrolled  depression, anxiety or insomnia. Denies  rash, lesions, or itch.      Current Medications (verified): 1)  Topamax 100 Mg Tabs (Topiramate) .... Take One Tab Two Times A Day 2)  Temazepam 30 Mg Caps (Temazepam) .... Take One Cap By Mouth At Bedtime 3)  Lasix 40 Mg Tabs (Furosemide) .... One Half To One Once Daily Prn 4)  Omeprazole 40 Mg Cpdr (Omeprazole) .... Take 1 Capsule By Mouth Once A Day 5)  Meclizine Hcl 25 Mg Tabs (Meclizine Hcl) .... Take 1 Tablet By Mouth Three Times A Day As Needed 6)  Propranolol Hcl 80 Mg Tabs (Propranolol Hcl) .... Take 1 Tablet  By Mouth Once A Day 7)  Amlodipine Besylate 10 Mg Tabs (Amlodipine Besylate) .... Take 1 Tablet By Mouth Once A Day 8)  Levetiracetam 500 Mg Tabs (Levetiracetam) .... Take 1 Tablet By Mouth Two Times A Day 9)  Imipramine Hcl 50 Mg Tabs (Imipramine Hcl) .... Two Tablets At Bedtime 10)  Ra Potassium Gluconate 595 Mg Tabs (Potassium Gluconate) .... Take 1 Tablet By Mouth Once A Day  Allergies (verified): 1)  ! Sulfa 2)  ! Codeine  Review of Systems      See HPI Eyes:  Denies discharge, eye pain, and red eye. MS:  Complains of joint pain and stiffness. Neuro:  Complains of headaches and sensation of room spinning. Psych:  Complains of anxiety and irritability; denies suicidal thoughts/plans, thoughts of violence, and unusual visions or sounds. Endo:  Denies excessive thirst and excessive urination. Heme:  Denies abnormal bruising, bleeding, and fevers. Allergy:  Denies hives or rash and itching eyes.  Physical Exam  General:  Well-developed,well-nourished,in no acute distress; alert,appropriate and cooperative throughout examination HEENT: No facial asymmetry,  EOMI, No sinus tenderness, TM's Clear, oropharynx  pink and moist.   Chest: Clear to auscultation bilaterally.  CVS: S1, S2, No murmurs, No S3.   Abd: Soft, Nontender.  MS: Adequate ROM spine, hips, shoulders and  reduced in  knees.  Ext: No edema.   CNS: CN 2-12 intact, power tone and sensation normal throughout.   Skin: Intact, no visible lesions or rashes.  Psych: Good eye contact, normal affect.  Memory intact, not anxious or depressed appearing.    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 One half to one once daily prn    Propranolol Hcl 80 Mg Tabs (Propranolol hcl) .Marland Kitchen... Take 1 tablet by mouth once a day    Amlodipine Besylate 10 Mg Tabs (Amlodipine besylate) .Marland Kitchen... Take 1 tablet by mouth once a day  BP today: 140/82 Prior BP: 132/80 (11/25/2009)  Labs Reviewed: K+: 4.4 (11/26/2009) Creat: : 0.96 (11/26/2009)   Chol: 194 (11/26/2009)   HDL: 80 (11/26/2009)   LDL: 96 (11/26/2009)   TG: 91 (11/26/2009)  Problem # 4:  DIABETES MELLITUS (ICD-250.00) Assessment: Comment Only  Orders: T- Hemoglobin A1C (09811-91478)  Labs Reviewed: Creat: 0.96 (11/26/2009)    Reviewed HgBA1c results: 6.4 (11/26/2009)  6.3 (06/19/2009) Pt advised to reduce carbohydrate intake, espescially sweets, and to start regular physical activity, at least 30 minutes 5 days weekly, to enable weight loss, and reduce the risk of becoming diabetic   Problem # 5:  INTERMITTENT VERTIGO (ICD-780.4) Assessment: Deteriorated  Her updated medication list for this problem includes:    Meclizine Hcl 25 Mg Tabs (Meclizine hcl) .Marland Kitchen... Take 1 tablet by mouth three times a day as needed    Meclizine Hcl 25 Mg Tabs (Meclizine hcl) .Marland Kitchen... Take 1 tablet by mouth three times a day as needed for dizziness  Orders: Medicare Electronic Prescription [phone]) ENT Referral (ENT)  Complete Medication List: 1)  Topamax 100 Mg Tabs (Topiramate) .... Take one tab two times a day 2)  Temazepam 30 Mg Caps (Temazepam) .... Take one cap by mouth at bedtime 3)  Lasix 40 Mg Tabs (Furosemide) .... One half to one once daily prn 4)  Omeprazole 40 Mg Cpdr (Omeprazole) .... Take 1 capsule by mouth once a day 5)  Meclizine Hcl 25 Mg Tabs (Meclizine hcl) .... Take 1 tablet by mouth three times a day as  needed 6)  Propranolol Hcl 80 Mg Tabs (Propranolol hcl) .... Take 1 tablet by mouth once a day 7)  Amlodipine Besylate 10 Mg Tabs (Amlodipine besylate) .... Take 1 tablet by mouth once a day 8)  Levetiracetam 500 Mg Tabs (Levetiracetam) .... Take 1 tablet by mouth two times a day 9)  Imipramine Hcl 50 Mg Tabs (Imipramine hcl) .... Two tablets at bedtime 10)  Ra Potassium Gluconate 595 Mg Tabs (Potassium gluconate) .... Take 1 tablet by mouth once a day 11)  Meclizine Hcl 25 Mg Tabs (Meclizine hcl) .... Take 1 tablet by mouth three times a day as needed for dizziness  Patient Instructions: 1)  Please schedule a follow-up appointment in 3 months. 2)  Meds are being sent in for vertigo and you are being referred to ENT. 3)  you will be referred to ortho about your left knoeee in network. 4)  Injections today  for  knee pain 5)  HbgA1C prior to visit, ICD-9:   end feb  6)  You are being referred for a colonscopy, you had a normal one in jan 2002, so now due Prescriptions: MECLIZINE HCL 25 MG TABS (MECLIZINE HCL) Take 1 tablet by mouth three times a day as needed  #42 x 1   Entered by:   Everitt Amber LPN   Authorized by:   Syliva Overman MD   Signed by:   Everitt Amber LPN on 16/11/9602   Method used:   Electronically to        [facility] [address] [phone]* (retail)       [address] [place], [place]  [address]       Ph: [phone]       Fax: [phone]   RxID:   [id number] MECLIZINE HCL 25 MG TABS (MECLIZINE HCL) Take 1 tablet by mouth three times a day as needed for dizziness  #30 x 1   Entered and Authorized by:   Syliva Overman MD   Signed by:   Syliva Overman MD on 02/26/2010   Method used:   Electronically to        [facility] [phone]* (retail)       [address]       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 Patient Level IV [30865] 2)  Medicare Electronic Prescription [G8553] 3)  T- Hemoglobin A1C [83036-23375] 4)  Depo- Medrol 80mg  [J1040] 5)  Ketorolac-Toradol 15mg  [J1885] 6)  Admin of  Therapeutic Inj  intramuscular or subcutaneous [96372] 7)  Gastroenterology Referral [GI] 8)  ENT Referral [ENT] 9)  Orthopedic Referral [Ortho]

## 2010-03-19 NOTE — Medication Information (Signed)
Summary: Tax adviser   Imported By: Lind Guest 02/11/2010 16:57:37  _____________________________________________________________________  External Attachment:    Type:   Image     Comment:   External Document

## 2010-04-16 ENCOUNTER — Encounter: Payer: Self-pay | Admitting: Family Medicine

## 2010-04-16 ENCOUNTER — Ambulatory Visit (INDEPENDENT_AMBULATORY_CARE_PROVIDER_SITE_OTHER): Payer: Medicare HMO | Admitting: Otolaryngology

## 2010-04-16 DIAGNOSIS — R42 Dizziness and giddiness: Secondary | ICD-10-CM

## 2010-04-16 DIAGNOSIS — H814 Vertigo of central origin: Secondary | ICD-10-CM

## 2010-04-21 ENCOUNTER — Encounter: Payer: Self-pay | Admitting: Family Medicine

## 2010-04-28 NOTE — Letter (Signed)
Summary: right source  right source   Imported By: Lind Guest 04/21/2010 16:53:29  _____________________________________________________________________  External Attachment:    Type:   Image     Comment:   External Document

## 2010-04-28 NOTE — Letter (Signed)
Summary: ent  ent   Imported By: Lind Guest 04/21/2010 16:52:59  _____________________________________________________________________  External Attachment:    Type:   Image     Comment:   External Document

## 2010-05-26 ENCOUNTER — Encounter: Payer: Self-pay | Admitting: Family Medicine

## 2010-05-27 ENCOUNTER — Other Ambulatory Visit: Payer: Self-pay | Admitting: Family Medicine

## 2010-05-28 ENCOUNTER — Encounter: Payer: Self-pay | Admitting: Family Medicine

## 2010-05-28 ENCOUNTER — Ambulatory Visit (INDEPENDENT_AMBULATORY_CARE_PROVIDER_SITE_OTHER): Payer: Medicare HMO | Admitting: Family Medicine

## 2010-05-28 VITALS — BP 128/80 | HR 72 | Resp 16 | Ht 65.25 in | Wt 213.1 lb

## 2010-05-28 DIAGNOSIS — E119 Type 2 diabetes mellitus without complications: Secondary | ICD-10-CM

## 2010-05-28 DIAGNOSIS — I10 Essential (primary) hypertension: Secondary | ICD-10-CM

## 2010-05-28 DIAGNOSIS — Z1211 Encounter for screening for malignant neoplasm of colon: Secondary | ICD-10-CM

## 2010-05-28 DIAGNOSIS — E785 Hyperlipidemia, unspecified: Secondary | ICD-10-CM

## 2010-05-28 DIAGNOSIS — G43909 Migraine, unspecified, not intractable, without status migrainosus: Secondary | ICD-10-CM

## 2010-05-28 DIAGNOSIS — R51 Headache: Secondary | ICD-10-CM

## 2010-05-28 MED ORDER — LOSARTAN POTASSIUM-HCTZ 50-12.5 MG PO TABS: 1.0000 | ORAL_TABLET | Freq: Every day | ORAL | Status: DC

## 2010-05-28 MED ORDER — METFORMIN HCL 500 MG PO TABS: 500.0000 mg | ORAL_TABLET | Freq: Every day | ORAL | Status: DC

## 2010-05-28 MED ORDER — SUMATRIPTAN SUCCINATE 100 MG PO TABS: 100.0000 mg | ORAL_TABLET | ORAL | Status: DC | PRN

## 2010-05-28 NOTE — Patient Instructions (Addendum)
F/U in 3 months.  New   Med for diabetes, annd you need to test once daily. New med for blood pressure sent in. Migraine med sent in.  Pls call about the colonoscopy if you do not hear from Dr. Ollen Gross office in the next 2 weeks

## 2010-06-01 ENCOUNTER — Other Ambulatory Visit: Payer: Self-pay | Admitting: *Deleted

## 2010-06-01 DIAGNOSIS — E119 Type 2 diabetes mellitus without complications: Secondary | ICD-10-CM

## 2010-06-01 MED ORDER — ACCU-CHEK AVIVA PLUS W/DEVICE KIT: 1.0000 | PACK | Freq: Every day | Status: DC

## 2010-06-01 MED ORDER — GLUCOSE BLOOD VI STRP: ORAL_STRIP | Status: AC

## 2010-06-01 MED ORDER — ACCU-CHEK MULTICLIX LANCETS MISC: Status: AC

## 2010-06-16 DIAGNOSIS — I2699 Other pulmonary embolism without acute cor pulmonale: Secondary | ICD-10-CM

## 2010-06-16 HISTORY — DX: Other pulmonary embolism without acute cor pulmonale: I26.99

## 2010-06-23 NOTE — Assessment & Plan Note (Signed)
Controlled, no change in medication  

## 2010-06-23 NOTE — Progress Notes (Signed)
  Subjective:    Patient ID: [name], female    DOB: 1940-05-09, 70 y.o.   MRN: [id number]  HPI The PT is here for follow up and re-evaluation of chronic medical conditions, medication management and review of recent lab and radiology data.  Preventive health is updated, specifically  Cancer screening, and Immunization.   Questions or concerns regarding consultations or procedures which the PT has had in the interim are  addressed. The PT denies any adverse reactions to current medications since the last visit.  There are no new concerns.  There are no specific complaints       Review of Systems Denies recent fever or chills. Denies sinus pressure, nasal congestion, ear pain or sore throat. Denies chest congestion, productive cough or wheezing. Denies chest pains, palpitations, paroxysmal nocturnal dyspnea, orthopnea and leg swelling Denies abdominal pain, nausea, vomiting,diarrhea or constipation.  Denies rectal bleeding or change in bowel movement. Denies dysuria, frequency, hesitancy or incontinence. Denies joint pain, swelling and limitation in mobility. Denies  seizure, numbness, or tingling.Headaches have improved in severity and frequency. Denies uncontrolled  depression, anxiety or insomnia.She is been  on medication for this in the past Denies skin break down or rash.        Objective:   Physical Exam Patient alert and oriented and in no Cardiopulmonary distress.  HEENT: No facial asymmetry, EOMI, no sinus tenderness, TM's clear, Oropharynx pink and moist.  Neck supple no adenopathy.  Chest: Clear to auscultation bilaterally.  CVS: S1, S2 no murmurs, no S3.  ABD: Soft non tender. Bowel sounds normal.  Ext: No edema  MS: Adequate ROM spine, shoulders, hips and knees.  Skin: Intact, no ulcerations or rash noted.  Psych: Good eye contact, normal affect. Memory intact not anxious or depressed appearing.  CNS: CN 2-12 intact, power, tone and sensation  normal throughout.        Assessment & Plan:

## 2010-06-23 NOTE — Assessment & Plan Note (Signed)
Improved, continue meds as before

## 2010-06-23 NOTE — Assessment & Plan Note (Signed)
Medication compliance addressed. Com and low carb  diet and low fat diet discussed and literature offered. Changes in medication made at this visit.

## 2010-07-03 NOTE — Assessment & Plan Note (Signed)
[facility]                         [place] [facility] OFFICE NOTE   NAME:[name], [name] SHERWOOD                       MRN:          [id number]  DATE:09/03/2005                            DOB:          10-30-1940    [name] was seen back on the 6th of this month by Dr. Jens Som after an  episode of syncope.  He did a relatively detailed evaluation on her  including a rest/stress Myoview and an echocardiogram.  Both were done on  the 16th of this month, both of which were normal.  She has not had any  recurrent episodes.  She is wearing a monitor but has not triggered the  monitor because she has had no recurrent episodes.  She actually feels well  without any complaints.   PHYSICAL EXAMINATION:  VITAL SIGNS:  Blood pressure is 126/78.  Her pulse is  68.  She weighs 214 pounds.  CHEST:  Clear.  CARDIAC:  Reveals a regular rhythm.  There is no significant murmur heard.  EXTREMITIES:  Her lower extremities are without significant edema.   MEDICATIONS:  Unchanged from that previously documented.   So I told [name] that we really didn't see anything that was of any  significance regarding this episode of syncope from a cardiac standpoint.  There were other things that probably needed to be evaluated and then I  recommended that she followup with Dr. Lodema Hong to see if any further  evaluation should be done.  At this point I am encouraged that we do not  have a significant cardiovascular problem.  She is going to complete a two  week course with the event monitor and that will be done a week from  Tuesday.                                   Farris Has. Dorethea Clan, MD, [facility]   JMH/MedQ  DD:  09/03/2005  DT:  09/03/2005  Job #:  [id number]   cc:   Milus Mallick. Lodema Hong, MD

## 2010-07-03 NOTE — Discharge Summary (Signed)
[name], [name]                          ACCOUNT NO.:  [id number]   MEDICAL RECORD NO.:  [id number]                   PATIENT TYPE:  INP   LOCATION:  A228                                 FACILITY:  [facility]   PHYSICIAN:  Hanley Hays. Dechurch, M.D.           DATE OF BIRTH:  1940/08/15   DATE OF ADMISSION:  03/15/2003  DATE OF DISCHARGE:  03/18/2003                                 DISCHARGE SUMMARY   DIAGNOSES:  1. Syncopal episode, probable orthostatic.  2. Chest pain, noncardiac.  3. Diabetes mellitus.  4. Hyperlipidemia.  5. Migraine headache on suppressive Inderal.  6. Osteoarthritis of knees, hips, and spine.  7. Obesity.  8. Multiple urinary tract infection.  9. ACE intolerance secondary to cough.  10.      Diastolic dysfunction.   DISPOSITION:  Patient discharged to home, follow up with Dr. Lodema Hong in 7-10  days.   PROCEDURE:  Exercise Cardiolite with no evidence of ischemia or scar. Normal  left ventricular ejection fraction of 68%.   HOSPITAL COURSE:  A 70 year old African-American female followed by Dr.  Syliva Overman with a history of diabetes mellitus, reflux, hyperlipidemia  who noted chest pain after awakening on the morning of admission with nausea  and diuresis.  She felt faint and apparently had a syncopal episode while  walking to the bathroom.  In the emergency room she was given sublingual  nitroglycerin and her pain abated.  She also had a rather marked hypotensive  response.  The patient was admitted to the hospital with further evaluation.  She remained clinically stable with the exception of a headache which was  treated with 1 dose of Demerol and Phenergan with good resolution.  She had  cardiology consultation who recommended inpatient Cardiolite stress test.  The patient actually did well.  Her echocardiogram revealed some upper  septal hypertrophy which did not appear to have an interference with the  left ventricular outflow with a mild impaired  diastolic function.   The patient remained clinically stable.  Her blood pressure during the  hospital stay, on reduced medications, never went above 120. She had no  further syncope.  The did have a mildly hypertensive response to exercise,  but of course her antihypertensive had been held prior to the study.   She was admitted to the hospital for further evaluation.  The patient had no  further chest pain.  She had no further syncope.  She was noted to be  relatively hypotensive though not orthostatic, but she was on reduced blood  pressure medications.  She had an echocardiogram which revealed evidence of  diastolic dysfunction. She also had some LV hypertrophy, but no outflow  obstruction.   Laboratory data revealed normal cardiac enzymes.  Hemoglobin A1c is 6.1.  Potassium on admission was 3.1 and she was supplemented.  Cholesterol was  177 with an HDL of 63, LDL of 87.  The patient's blood pressures  during the  hospital stay never went above 120.  She tolerated her exercise test well  without pain, and the results as noted above.  She is being discharged to  home in stable condition.   DISCHARGE MEDICATIONS:  1. Benicar 40 daily or Diovan, but not both.  2. Inderal LA 80 mg daily.  3. Hydrochlorothiazide 12.5 daily.  4. Avandia 4 mg daily.  5. Nexium 40 daily.  6. Nitrofurantoin daily.  7. DynaCirc 5 mg at bedtime.  8. Zoloft daily.   PHYSICAL EXAMINATION:  GENERAL:  At the time of discharge a well-developed,  well-nourished, obese female in no distress.  VITAL SIGNS:  Blood pressure 99/56. Pulse is 90 (Inderal was held this  a.m.).  Respirations unlabored.  LUNGS:  Diminished.  HEART:  Regular.  No murmur.  ABDOMEN:  Obese, soft, nontender. No clubbing, cyanosis, or edema is noted.  NEUROLOGIC:  Exam is intact.   ASSESSMENT/PLAN:  As noted above.     ___________________________________________                                         Hanley Hays. Josefine Class, M.D.    FED/MEDQ  D:  03/18/2003  T:  03/18/2003  Job:  [id number]   cc:   Milus Mallick. Lodema Hong, M.D.  [address]  [place], [place] [address]  Fax: [phone]

## 2010-07-03 NOTE — H&P (Signed)
NAME:  [name], FRAKES NO.:  [id number]   MEDICAL RECORD NO.:  [id number]                   PATIENT TYPE:  INP   LOCATION:  A220                                 FACILITY:  [facility]   PHYSICIAN:  Melvyn Novas, MD        DATE OF BIRTH:  1940-11-09   DATE OF ADMISSION:  09/26/2003  DATE OF DISCHARGE:                                HISTORY & PHYSICAL   The patient is a 70 year old black female with recurrent nausea and vomiting  yesterday, episodes of emesis x6. She denied any hematemesis. She did have  some diaphoresis, and she felt a little left sided pectoral pressure pain  which radiated down to her left breast. This did not radiate through to the  back, was not associated with any palpitations or dyspnea. The pain  persisted off and on for six hours. She was admitted to the ICU, placed on  IV nitro, aspirin, empiric antiplatelet therapy, and usual ischemic regimen  to rule out infarction.   PAST MEDICAL HISTORY:  1. Type 2 diabetes mellitus.  2. Hypertension.  3. Hyperlipidemia.  4. Migraine headaches.   PAST SURGICAL HISTORY:  1. Remarkable for bunionectomy x2.  2. D&C.  3. She had a negative Cardiolite study six months ago.   PHYSICAL EXAMINATION:  VITAL SIGNS:  Blood pressure is 178/84, pulse 80 and  regular. She is afebrile. Respiratory rate is 14.  HEENT:  Head:  Normocephalic, atraumatic. Eyes:  PERRLA. Extraocular  movements intact. Sclerae pale. Conjunctivae pink. Throat shows no erythema  and no exudates.  NECK:  No jugular venous distention at 45 degrees angulation. No carotid  bruits, no thyromegaly, no thyroid bruits.  LUNGS:  Showed diminished breath sounds at the basis. No rales, wheezes, or  rhonchi appreciable.  HEART:  Regular rhythm, 1/6 aortic outflow murmur; no S3, S4, or gallops; no  heaves, thrills, or rubs.  ABDOMEN:  Soft, nontender, bowel sounds normoactive. No guarding, rebound,  masses, or megaly.  EXTREMITIES:  Trace to 1+ pedal edema. No clubbing. No cyanosis.  NEUROLOGICAL:  Cranial nerves II-XII were grossly intact. The patient moves  all four extremities. Plants are downgoing.   IMPRESSION:  1. Left sided typical/atypical chest pain.  2. Gastroenteritis.  3. Hypertension.  4. Diabetes.  5. Hyperlipidemia.   The plan is to admit, IV nitro, aspirin, beta blockers. Check electrolytes.  Serial cardiac enzymes, and I will make further recommendations as the  database expands.     ___________________________________________                                         Melvyn Novas, MD   RMD/MEDQ  D:  09/27/2003  T:  09/27/2003  Job:  [id number]

## 2010-07-03 NOTE — Assessment & Plan Note (Signed)
[facility]                         [facility] OFFICE NOTE   NAME:[name], [name]                       MRN:          [id number]  DATE:09/14/2005                            DOB:          05-Jun-1940    PRIMARY CARE PHYSICIAN:  Dr. Lodema Hong.   [name] returns today.  This is a lady, who Dr. Jens Som saw initially  back on the 6th of this month for syncope.  She had a workup, including a  rest stress Myoview and an echocardiogram, which were unremarkable.  However, her event monitors showed some atrial fibrillation with a rapid  ventricular response and so I had her come back in so we could talk a little  bit about Coumadin.  This is a lady, who has hypertension, hyperlipidemia,  and diabetes, no significant coronary disease.  She has not had any episodes  of syncope since then.  Her echocardiogram did not show any significant left  atrial enlargement.  She does have mild left ventricular hypertrophy and  normal LV systolic function so I think she is probably at a reasonably low  risk for a thromboembolic event and, being that she is on full dose aspirin  and tolerating it reasonably well, I do not feel inclined to start her on  Coumadin.  We talked at length about this, including the statistical  information regarding the risk of Coumadin.  I am not sure that [name]  completely understands the discussion, but I tried to break it down into its  simplest elements.  I also provided her with some information to read  concerning this, and I told her that I thought she could continue the  aspirin for now until she felt like she had a firm idea of whether or not  she wanted to start Coumadin.  While my sense of things was that her  relative risk was not necessarily high, however, as she ages it may increase  and it probably would not be a bad idea to continue to just think about  whether to start Coumadin or not.  I think she understands some of  what I  was talking about and, as such, she will follow up in six months for another  detailed discussion as to whether or not she is a person, who benefits from  Coumadin.   PHYSICAL EXAMINATION:  VITAL SIGNS:  Blood pressure is 130/76, her pulse is  70, she weighs 207 pounds.  NECK:  She is without significant jugulovenous distention or carotid bruits.  CHEST:  Clear to auscultation.  CARDIAC:  Irregular.  I do not hear any significant murmurs.  EXTREMITIES:  Without significant edema.   We will see her back in six months for followup.                                   Gerrit Friends. Dietrich Pates, MD, [facility]   RMR/MedQ  DD:  09/14/2005  DT:  09/14/2005  Job #:  [id number]

## 2010-07-03 NOTE — Discharge Summary (Signed)
[name], [name]                          ACCOUNT NO.:  [id number]   MEDICAL RECORD NO.:  [id number]                   PATIENT TYPE:  INP   LOCATION:  A220                                 FACILITY:  [facility]   PHYSICIAN:  Kingsley Callander. Ouida Sills, M.D.                  DATE OF BIRTH:  12/30/1940   DATE OF ADMISSION:  09/26/2003  DATE OF DISCHARGE:  09/29/2003                                 DISCHARGE SUMMARY   DISCHARGE DIAGNOSES:  1. Chest pain, myocardial infarction ruled out.  2. Hypertension.  3. Type 2 diabetes.  4. Migraine headaches.  5. Dementia.  6. Gastroenteritis.  7. Hypokalemia.   HOSPITAL COURSE:  This patient is a 70 year old female who presented to the  emergency room with vomiting, atypical chest pain, and migraine headache.  She was treated with Zofran and IV fluids.  She initially was treated also  with IV nitroglycerin.  Three sets of cardiac enzymes were negative.  Her  chest x-ray revealed no acute infiltrate.  She was hypokalemic at 3.  She  was supplemented to a normal potassium of 4.3.   Her vomiting resolved.  She was stable for discharge on September 29, 2003.  Vital signs were normal.  She was able to eat without difficulty.  She will  be seen in followup by Dr. Lodema Hong in one week.   Serial EKG's revealed no acute ischemic changes.   DISCHARGE MEDICATIONS:  1. Benicar 40 mg daily.  2. Inderal LA 80 mg daily.  3. Aspirin 81 mg daily.  4. Aricept 5 mg daily.  5. Hydrochlorothiazide 25 mg daily.  6. Imipramine 25 mg three q.h.s.  7. DAT nasal spray 0.5 mg two t.i.d. p.r.n.  8. Avandia 4 mg daily.  9. Combivent 2 puffs q.i.d.     ___________________________________________                                         Kingsley Callander. Ouida Sills, M.D.   ROF/MEDQ  D:  09/29/2003  T:  09/29/2003  Job:  [id number]   cc:   Milus Mallick. Lodema Hong, M.D.  [address]  [place], [place] [address]  Fax: [phone]

## 2010-07-03 NOTE — Procedures (Signed)
NAMEMEIGAN, PATES                          ACCOUNT NO.:  [id number]   MEDICAL RECORD NO.:  [id number]                   PATIENT TYPE:  INP   LOCATION:  A228                                 FACILITY:  [facility]   PHYSICIAN:  Vida Roller, M.D.                DATE OF BIRTH:  1940/03/22   DATE OF PROCEDURE:  03/15/2003  DATE OF DISCHARGE:                                  ECHOCARDIOGRAM   TAPE NUMBER:  [id number], tape count 0 through 384.   INDICATION:  This is a 70 year old woman with chest pain.  No previous  echocardiograms for comparison.   TECHNICAL QUALITY:  Good.   M-MODE TRACINGS:  1. The aorta is 26 mm.  2. The left atrium is 34 mm.  3. The septum is 10 mm.  4. Posterior wall is 10 mm.  5. Left ventricular diastolic dimension is 40 mm.  6. Left ventricular systolic dimension 26 mm.   2-D AND DOPPLER IMAGING:  1. The left ventricular is normal size with normal left ventricular systolic     function.  There are no wall-motion abnormalities.  There is mild upper     septal hypertrophy which does not interfere with the left ventricular     outflow.  There is mild impaired diastolic function.  2. The right ventricle is normal size with normal systolic function.  3. Both atria are normal size.  4. The aortic valve is mildly sclerotic with mild insufficiency.  No     stenosis is seen.  5. The mitral valve is morphologically unremarkable with mild insufficiency.     No stenosis is seen.  6. The tricuspid valve has mild insufficiency.  No stenosis is seen.  7. The pulmonic valve has mild insufficiency.  8. The pericardial structures show a very, very small pericardial effusion     versus a prominent fat pad.  9. The inferior vena cava appears to be normal size.  10.      The ascending aorta was not well seen.      ___________________________________________                                            Vida Roller, M.D.   JH/MEDQ  D:  03/15/2003  T:  03/15/2003  Job:   [id number]

## 2010-07-03 NOTE — Consult Note (Signed)
[name], HARALSON NO.:  [id number]   MEDICAL RECORD NO.:  [id number]                   PATIENT TYPE:  INP   LOCATION:  A228                                 FACILITY:  [facility]   PHYSICIAN:  Vida Roller, M.D.                DATE OF BIRTH:  26-Apr-1940   DATE OF CONSULTATION:  03/15/2003  DATE OF DISCHARGE:                                   CONSULTATION   CARDIOLOGY CONSULTATION   PRIMARY CARE Masato Pettie:  Milus Mallick. Lodema Hong, M.D. in [place], [address]  [place].   PREVIOUS CARDIOLOGIST:  Jesse Sans. Wall, M.D.   REASON FOR CONSULT:  This is a 70 year old female with no known coronary  artery disease who presented to the emergency department complaining of  nausea and chest fullness. This started about 5 o'clock this morning.  Denies any emesis. She had some shortness of breath associated with walking  to the bathroom with a presyncopal episode with weakness.  No diaphoresis.  No exaggerating symptoms.  The pain resolved with 2 sublingual  nitroglycerins when she arrived in the ER at [facility].  Total  duration of the pain was approximately an hour to 1-1/2 hours.  The  shortness of breath concluded with the application of oxygen. She reports  mild abdominal discomfort associated with this.  No PND, no orthopnea, no  peripheral edema.   PAST MEDICAL HISTORY:  Her past medical history is significant for  hyperlipidemia, diabetes mellitus, hypertension, obesity, and degenerative  joint disease as well as gastroesophageal reflux disease.  She is intolerant  to ACE inhibitors secondary to a cough.  She had an exercise Cardiolite back  in 2000 which showed no ischemia, normal ejection fraction, no wall motion  abnormalities for an episode of discomfort in her chest very similar to  this. She is awaiting a surgical procedure.  She has had multiple urinary  tract infections thought to be secondary to chronic reflux.   SOCIAL HISTORY:  She lives  in [place] with her husband who is my patient.  She is disabled.  She is married.  She has 2 children.  She rides a  stationary bike on occasion.  She eats a low carbohydrate diet.  She has  never smoked. She does not drink alcohol or use drugs.  Her mother is alive  at age 53 with congestive heart failure and has a pacemaker. I believe I  take care of her as well.  Her father died at age 72 of a myocardial  infarction; had leukemia.  She has 4 brothers and 5 sisters none of whom  have coronary disease.   REVIEW OF SYSTEMS:  Her review of systems is significant for migraine  headaches with occasional dysuria and nocturia secondary to her urinary  tract infection.  He does have chronic abdominal pain as well.   MEDICATIONS PRIOR TO ADMISSION:  1. Nitrofurantoin 100 mg once a  day.  2. DynaCirc 5 mg b.i.d.  3. Inderal LA 80 mg a day.  4. Nexium 40 mg a day.  5. Hydrochlorothiazide 25 mg a day.  6. Avandia 4 mg a day.  7. Diovan 80 mg a day.  8. Aspirin 325 mg a day.   PHYSICAL EXAMINATION:  GENERAL:  She is an obese African-American female in  no apparent distress.  MENTAL STATUS:  She is alert and oriented x4.  VITAL SIGNS:  Her blood pressure is 104/64.  Her heart rate is 70 and  regular.  Her respiratory rate is 20.  She is afebrile. She weighs 200  pounds.  HEENT:  Examination of the head, eyes, ears, nose, and throat is  unremarkable.  NECK:  The neck is supple.  There is no jugular venous distention or carotid  bruits.  CHEST:  Her chest is clear to auscultation with the exception of mild  decreased breath sounds at the base.  CARDIAC:  She has a regular rate and rhythm with a 2/6 systolic murmur heard  best at the upper right sternal border which radiates towards her neck.  ABDOMEN:  She has a soft, obese, abdomen with no apparent  hepatosplenomegaly.  She has no abdominal bruits.  GENITOURINARY AND RECTAL:  Exams were deferred.  EXTREMITIES:  Her extremity exam shows  no clubbing, cyanosis, or edema.  She  has normal pulses throughout with no bruits.  MUSCULOSKELETAL AND NEUROLOGIC:  Exams are nonfocal.   CHEST X-RAY:  Shows no acute disease.   ECHOCARDIOGRAM:  Done today shows normal left ventricular function with  trace aortic insufficiency.   ELECTROCARDIOGRAM:  Shows sinus bradycardia at a rate of 59 with normal  axes, normal intervals, No ST-T wave changes concerning for ischemia.  No Q  waves concerning for an acute myocardial infarction. No hypertrophy is seen.   LABORATORIES:  White blood cell count 6.9, H&H 11 and 33 with a platelet  count of 289.  Sodium 135, potassium 3.1, chloride 106, bicarb 26, BUN 26,  creatinine 1.0, her blood sugar is 125.  Liver function studies are normal.  One set of cardiac enzymes is not consistent with acute myocardial  infarction.  Clotting studies are normal.  Her total protein and albumin are  also normal.   ASSESSMENT:  So, this is a woman with atypical chest discomfort who has  multiple cardiac risk factors.  She has had a normal perfusion scan in the  past for similar symptoms. She has hyperlipidemia, hypertension, diabetes  mellitus, and she has low potassium probably secondary to her  hydrochlorothiazide.   RECOMMENDATIONS:  Our recommendations are that she will need an  adenocarcinoma Cardiolite; she will need her enzymes cycled for 3 sets.  She  needs fasting lipids to access the adequacy of her therapy.  She probably  needs her potassium replaced.  Today is Friday, we will not be able to do  the perfusion study until Monday. If here enzymes are all negative with a  normal echocardiogram and no recurrence of the pain, it would not be  unreasonable to arrange for her to have a perfusion scan on Monday and  discharge her from the hospital with close follow up.  If, however, she has recurrence of chest discomfort, her enzymes for any reason are abnormal, her  EKG becomes abnormal, or she starts to  have any more chest pain then she  needs to be referred for cardiac catheterization.      ___________________________________________  Vida Roller, M.D.   JH/MEDQ  D:  03/15/2003  T:  03/16/2003  Job:  [id number]

## 2010-07-03 NOTE — Procedures (Signed)
[name], [name]                ACCOUNT NO.:  [id number]   MEDICAL RECORD NO.:  [id number]          PATIENT TYPE:  OUT   LOCATION:  RAD                           FACILITY:  [facility]   PHYSICIAN:  [place] Bing, M.D. [facility] OF BIRTH:  02/26/1940   DATE OF PROCEDURE:  08/30/2005  DATE OF DISCHARGE:                                  ECHOCARDIOGRAM   REFERRING PHYSICIAN:  Milus Mallick. Lodema Hong, M.D./Brian Jens Som, M.D.   CLINICAL DATA:  A 70 year old woman with syncope, hypertension and diabetes.   M-MODE TRACINGS:  Aorta 3.1, left atrium 4.1, septum 1.5, posterior wall  1.2, LV diastole 3.5, LV systole 2.4.   RESULTS:  1.  Technically suboptimal, but adequate echocardiographic study.  2.  Normal left atrium, right atrium and right ventricle.  3.  Trileaflet aortic valve with mildly sclerotic leaflets; mild      calcification of the aortic annulus; very mild regurgitation.  4.  Normal tricuspid valve; trivial regurgitation.  5.  Normal pulmonic valve and proximal pulmonary artery.  6.  Grossly normal mitral valve.  7.  Normal left ventricular size; mild hypertrophy; normal regional and      global function.      Caroleen Bing, M.D. [facility]  Electronically Signed     RR/MEDQ  D:  08/30/2005  T:  08/31/2005  Job:  [id number]

## 2010-07-03 NOTE — Procedures (Signed)
[name], [name]                           ACCOUNT NO.:  [id number]   MEDICAL RECORD NO.:  [id number]                  PATIENT TYPE:  PINP   LOCATION:  228                                  FACILITY:  [facility]   PHYSICIAN:  Vida Roller, M.D.                DATE OF BIRTH:  [birth year]   DATE OF PROCEDURE:  DATE OF DISCHARGE:                                    STRESS TEST   INDICATION:  [name] is a 70 year old female with no known coronary artery  disease with complaints of atypical chest discomfort.  Her cardiac risk  factors include age, diabetes, and hypertension, cardiac enzymes are  negative x4 for acute myocardial infarction.   BASELINE DATA:  EKG reveals sinus rhythm at 87 beats per minute with  nonspecific ST abnormalities, blood pressure is 132/90.   EXERCISE CARDIOLITE:  Patient exercised for a total of 6 minutes to Bruce  protocol stage 2.  Maximum heart rate was 154 beats per minute which is 98%  of predicted maximum, maximum blood pressure is 182/88, EKG revealed no  ischemic changes and no arrhythmias.  Patient reported no chest discomfort.  She did have some mild shortness of breath at the end of exercise which  resolved in recovery.   FINAL IMAGES AND RESULTS:  Pending MD review.     ________________________________________  ___________________________________________  Jae Dire, P.A. [facility]                      Vida Roller, M.D.   AB/MEDQ  D:  03/18/2003  T:  03/18/2003  Job:  [id number]

## 2010-07-03 NOTE — H&P (Signed)
[name], [name]                          ACCOUNT NO.:  [id number]   MEDICAL RECORD NO.:  [id number]                   PATIENT TYPE:  INP   LOCATION:  A228                                 FACILITY:  [facility]   PHYSICIAN:  Hanley Hays. Dechurch, M.D.           DATE OF BIRTH:  [birth year]   DATE OF ADMISSION:  03/15/2003  DATE OF DISCHARGE:                                HISTORY & PHYSICAL   HISTORY OF PRESENT ILLNESS:  This is a 70 year old African American female  with a history of hypertension and diabetes mellitus, obesity,  gastroesophageal reflux and apparently hyperlipidemia who presented to the  emergency room after awakening this morning at 5 a.m. with nausea, chest  pain and diaphoresis.  She felt faint while she was walking to the bathroom.  Because of these findings, she presented to the emergency room where she was  given nitroglycerin sublingually, and her pain abated.  In the emergency  room, her cardiac enzymes were unremarkable.   LABORATORY DATA:  Labs were pertinent for a potassium of 3.1, BUN 26,  creatinine 1.  Her hemoglobin was 11.3, hematocrit 33.  White count was 6.9.  CK was 94 with MB of 2.  Relative index was unremarkable.  Troponin was less  than 0.01.   EKG: Normal sinus rhythm with a rate of 59, no acute ischemic changes.  There are some nonspecific anterolateral changes most likely on the basis of  LVH.  The patient is being admitted to the hospital for evaluation of chest  pain resolving with nitroglycerin.   PAST MEDICAL HISTORY:  1. Hyperlipidemia.  2. Hypertension.  3. Diabetes mellitus.  4. Obesity.  5. Multiple urinary tract infections apparently awaiting some sort of     urology procedure.  6. Osteoarthritis of the knees, hips and spine.  7. Migraine headache, on Inderal which has been helpful.  8. ACE intolerance secondary to cough.   FAMILY HISTORY:  Father died at age 57 of myocardial infarction.  Mother has  heart failure and apparently  has a pacemaker.  Asthma in a brother.   SOCIAL HISTORY:  She is married with two children.  She lives with her  husband.  No smoking.  No second-hand smoke.  She is on disability because  of degenerative joint disease.  History of secondary smoke, though none in  the house.  No alcohol abuse.  She has two children alive and well.   MEDICATIONS:  1. Inderal LA 80 daily.  2. DynaCirc 5 b.i.d.  3. Imitrex as needed.  4. Hydrochlorothiazide 25 daily.  5. Benicar 40 daily.  6. Nitrofurantoin, one daily for suppressive therapy.  7. Enteric-coated aspirin, 325 daily.  8. Avandia 4 daily.  9. Nexium 40 daily.  10.      Tylenol P.M. for sleep.   ALLERGIES:  1. CODEINE.  2. SULFA.  3. She apparently had an ACE INHIBITOR induced cough   REVIEW  OF SYSTEMS:  Migraines, occasional, apparently well controlled with  the current regimen.  She has chronic sinus problems and UTI's.  She is not  very active secondary to her degenerative joint disease, but tries to ride a  stationary bike and does some other exercises.  No GI complaints.   PHYSICAL EXAMINATION:  GENERAL:  An obese female, in no distress.  VITAL SIGNS:  Blood pressure is currently 116/70, pulse is 84 and regular.  Respirations are pulse of 68, regular respirations, unlabored.  She is  obese.  LUNGS:  Diminished but clear.  Clear to auscultation.  HEART:  Regular rate and rhythm.  There is a 2/6 systolic murmur at the left  sternal border.  No gallop is present.  ABDOMEN:  Obese, soft, nontender.  EXTREMITIES:  Without clubbing or cyanosis. No appreciable edema.  Good  distal pulses.  NECK:  Supple, no adenopathy.  PULSES:  Equal bilaterally.  NEUROLOGIC:  Exam is grossly intact.   ASSESSMENT/PLAN:  1. Chest pain in a 70 year old female with multiple comorbidities and risk     factors, although she did have an unremarkable stress Cardiolite in 2000.     We will follow up her enzymes and EKG.  2. Continue her current medical  regimen at this point.  3. Her blood pressure is on the low side now, possibly related to her     nitroglycerin although it may be that she just has better control than     noted in the past.  4. She has been reluctant to take statin medications in the past because of     liver damage but she would be an ideal candidate.  5. Diabetes mellitus.  Control unknown.  We will check followup.  6. Reflux.  Continue her PPI.  We will use Protonix b.i.d. for her Nexium.   The plan was discussed with the patient and husband.  Should everything  remain negative and able to obtain, we could perform her Cardiolite as an  outpatient on Monday morning.     ___________________________________________                                         Hanley Hays. Josefine Class, M.D.   FED/MEDQ  D:  03/15/2003  T:  03/15/2003  Job:  [id number]

## 2010-07-09 ENCOUNTER — Encounter: Payer: Self-pay | Admitting: Family Medicine

## 2010-07-10 ENCOUNTER — Encounter: Payer: Self-pay | Admitting: Family Medicine

## 2010-07-10 ENCOUNTER — Ambulatory Visit (INDEPENDENT_AMBULATORY_CARE_PROVIDER_SITE_OTHER): Payer: Medicare Other | Admitting: Family Medicine

## 2010-07-10 ENCOUNTER — Ambulatory Visit (HOSPITAL_COMMUNITY)
Admission: RE | Admit: 2010-07-10 | Discharge: 2010-07-10 | Disposition: A | Discharge status: Home / Self Care | Payer: Medicare Other | Source: Ambulatory Visit | Attending: Family Medicine | Admitting: Family Medicine

## 2010-07-10 VITALS — BP 150/90 | HR 75 | Resp 16 | Ht 65.0 in | Wt 219.1 lb

## 2010-07-10 DIAGNOSIS — Z01818 Encounter for other preprocedural examination: Secondary | ICD-10-CM | POA: Insufficient documentation

## 2010-07-10 DIAGNOSIS — R0602 Shortness of breath: Secondary | ICD-10-CM | POA: Insufficient documentation

## 2010-07-10 DIAGNOSIS — E119 Type 2 diabetes mellitus without complications: Secondary | ICD-10-CM

## 2010-07-10 DIAGNOSIS — E785 Hyperlipidemia, unspecified: Secondary | ICD-10-CM

## 2010-07-10 DIAGNOSIS — G4700 Insomnia, unspecified: Secondary | ICD-10-CM

## 2010-07-10 DIAGNOSIS — I10 Essential (primary) hypertension: Secondary | ICD-10-CM | POA: Insufficient documentation

## 2010-07-10 DIAGNOSIS — M1710 Unilateral primary osteoarthritis, unspecified knee: Secondary | ICD-10-CM

## 2010-07-10 DIAGNOSIS — R5383 Other fatigue: Secondary | ICD-10-CM

## 2010-07-10 DIAGNOSIS — R5381 Other malaise: Secondary | ICD-10-CM

## 2010-07-10 LAB — POCT URINALYSIS DIPSTICK
Bilirubin, UA: NEGATIVE
Glucose, UA: NEGATIVE
Ketones, UA: NEGATIVE
Spec Grav, UA: 1.02

## 2010-07-10 MED ORDER — AMLODIPINE BESYLATE 10 MG PO TABS
10.0000 mg | ORAL_TABLET | Freq: Every day | ORAL | Status: DC
Start: 2010-07-10 — End: 2010-08-05

## 2010-07-10 MED ORDER — METFORMIN HCL 500 MG PO TABS: 500.0000 mg | ORAL_TABLET | Freq: Two times a day (BID) | ORAL | Status: DC

## 2010-07-10 MED ORDER — PROPRANOLOL HCL 80 MG PO TABS
80.0000 mg | ORAL_TABLET | Freq: Every day | ORAL | Status: DC
Start: 2010-07-10 — End: 2010-08-05

## 2010-07-10 MED ORDER — LOSARTAN POTASSIUM-HCTZ 100-25 MG PO TABS: 1.0000 | ORAL_TABLET | Freq: Every day | ORAL | Status: DC

## 2010-07-10 MED ORDER — TEMAZEPAM 30 MG PO CAPS: 30.0000 mg | ORAL_CAPSULE | Freq: Every evening | ORAL | Status: DC | PRN

## 2010-07-10 NOTE — Patient Instructions (Addendum)
F/U in 2 weeks.  Dose increase on the losartan/hctz 50/12.5 to TWO daily.  Restoril will be refilled for sleep.  Increase metformin to one twice daily  CXR today.  Fasting labs in 2 weeks.  Urine tests today.  We will give 1500 cal diet sheet

## 2010-07-10 NOTE — Progress Notes (Signed)
  Subjective:    Patient ID: [name], female    DOB: 05-Jul-1940, 70 y.o.   MRN: [id number]  HPI Pt is here for medical clearance for knee replacement surgery Pt has started metformin , her fastings range on avg from 120's to 140's. Will increase the dose to twice daily. She has a meter.  Has upcoming knee surgery, has seen cardiology, and is awaiting clearance for this , knee surgery is July 18  Very concerned about weight gain  Reports sharp pain in back of left shoulder last weekend, was not seen in the Ed , pain has resolved, she recently had cardiac evaluation, and I will f/u on this    Review of Systems Denies recent fever or chills. Denies sinus pressure, nasal congestion, ear pain or sore throat. Denies chest congestion, productive cough or wheezing. Denies chest pains, palpitations, paroxysmal nocturnal dyspnea, orthopnea and leg swelling Denies abdominal pain, nausea, vomiting,diarrhea or constipation.   Denies dysuria, frequency, hesitancy or incontinence.  Chronic migraine headaches,denies eizure, numbness, or tingling. Reports mild  Depression,she does have chronic  anxiety , increasing  Insomnia noted Denies skin break down or rash.        Objective:   Physical Exam Patient alert and oriented and in no Cardiopulmonary distress.  HEENT: No facial asymmetry, EOMI, no sinus tenderness, TM's clear, Oropharynx pink and moist.  Neck supple no adenopathy.  Chest: Clear to auscultation bilaterally.  CVS: S1, S2 no murmurs, no S3.  ABD: Soft non tender. Bowel sounds normal.  Ext: No edema  MS: Adequate ROM spine, shoulders, hips and markedly reduced in left knee, which is swollen and tender.  Skin: Intact, no ulcerations or rash noted.  Psych: Good eye contact, normal affect. Memory intact  anxious not  depressed appearing.  CNS: CN 2-12 intact, power, tone and sensation normal throughout.        Assessment & Plan:

## 2010-07-11 LAB — MICROALBUMIN / CREATININE URINE RATIO
Creatinine, Urine: 130.6 mg/dL
Microalb, Ur: 1.96 mg/dL — ABNORMAL HIGH (ref 0.00–1.89)

## 2010-07-13 LAB — CBC WITH DIFFERENTIAL/PLATELET
Eosinophils Relative: 0 % (ref 0–5)
HCT: 33.5 % — ABNORMAL LOW (ref 36.0–46.0)
Lymphocytes Relative: 28 % (ref 12–46)
Lymphs Abs: 2.3 10*3/uL (ref 0.7–4.0)
MCV: 77.9 fL — ABNORMAL LOW (ref 78.0–100.0)
Monocytes Absolute: 0.6 10*3/uL (ref 0.1–1.0)
RBC: 4.3 MIL/uL (ref 3.87–5.11)
WBC: 8.3 10*3/uL (ref 4.0–10.5)

## 2010-07-13 LAB — HEPATIC FUNCTION PANEL
Bilirubin, Direct: 0.1 mg/dL (ref 0.0–0.3)
Indirect Bilirubin: 0.3 mg/dL (ref 0.0–0.9)

## 2010-07-13 LAB — BASIC METABOLIC PANEL
Glucose, Bld: 134 mg/dL — ABNORMAL HIGH (ref 70–99)
Potassium: 3.7 mEq/L (ref 3.5–5.3)
Sodium: 139 mEq/L (ref 135–145)

## 2010-07-13 LAB — LIPID PANEL
LDL Cholesterol: 145 mg/dL — ABNORMAL HIGH (ref 0–99)
Total CHOL/HDL Ratio: 3.9 Ratio
VLDL: 28 mg/dL (ref 0–40)

## 2010-07-14 ENCOUNTER — Inpatient Hospital Stay (HOSPITAL_COMMUNITY)
Admission: EM | Admit: 2010-07-14 | Discharge: 2010-07-20 | DRG: 176 | Disposition: A | Discharge status: Home Health | Payer: Medicare HMO | Source: Ambulatory Visit | Attending: Internal Medicine | Admitting: Internal Medicine

## 2010-07-14 ENCOUNTER — Emergency Department (HOSPITAL_COMMUNITY): Payer: Medicare HMO

## 2010-07-14 DIAGNOSIS — N179 Acute kidney failure, unspecified: Secondary | ICD-10-CM | POA: Diagnosis present

## 2010-07-14 DIAGNOSIS — E119 Type 2 diabetes mellitus without complications: Secondary | ICD-10-CM | POA: Diagnosis present

## 2010-07-14 DIAGNOSIS — D509 Iron deficiency anemia, unspecified: Secondary | ICD-10-CM | POA: Diagnosis present

## 2010-07-14 DIAGNOSIS — I10 Essential (primary) hypertension: Secondary | ICD-10-CM | POA: Diagnosis present

## 2010-07-14 DIAGNOSIS — I2699 Other pulmonary embolism without acute cor pulmonale: Principal | ICD-10-CM | POA: Diagnosis present

## 2010-07-14 LAB — TROPONIN I: Troponin I: <0.3 ng/mL (ref <0.30)

## 2010-07-14 LAB — BASIC METABOLIC PANEL
BUN: 32 mg/dL — ABNORMAL HIGH (ref 6–23)
Chloride: 98 mEq/L (ref 96–112)
GFR calc non Af Amer: 30 mL/min — ABNORMAL LOW (ref >60)
Glucose, Bld: 129 mg/dL — ABNORMAL HIGH (ref 70–99)
Potassium: 3.1 mEq/L — ABNORMAL LOW (ref 3.5–5.1)
Sodium: 139 mEq/L (ref 135–145)

## 2010-07-15 ENCOUNTER — Inpatient Hospital Stay (HOSPITAL_COMMUNITY): Payer: Medicare HMO

## 2010-07-15 ENCOUNTER — Encounter (HOSPITAL_COMMUNITY): Payer: Self-pay

## 2010-07-15 DIAGNOSIS — R079 Chest pain, unspecified: Secondary | ICD-10-CM

## 2010-07-15 DIAGNOSIS — R7989 Other specified abnormal findings of blood chemistry: Secondary | ICD-10-CM

## 2010-07-15 LAB — DIFFERENTIAL
Eosinophils Absolute: 0 10*3/uL (ref 0.0–0.7)
Lymphocytes Relative: 32 % (ref 12–46)
Lymphs Abs: 3.5 10*3/uL (ref 0.7–4.0)
Monocytes Relative: 8 % (ref 3–12)
Neutrophils Relative %: 60 % (ref 43–77)

## 2010-07-15 LAB — CARDIAC PANEL(CRET KIN+CKTOT+MB+TROPI)
CK, MB: 2.6 ng/mL (ref 0.3–4.0)
CK, MB: 3.9 ng/mL (ref 0.3–4.0)
CK, MB: 3.8 ng/mL (ref 0.3–4.0)
Relative Index: INVALID (ref 0.0–2.5)
Relative Index: 3.9 — ABNORMAL HIGH (ref 0.0–2.5)
Relative Index: INVALID (ref 0.0–2.5)
Total CK: 93 U/L (ref 7–177)
Total CK: 100 U/L (ref 7–177)
Total CK: 88 U/L (ref 7–177)
Troponin I: <0.3 ng/mL (ref <0.30)

## 2010-07-15 LAB — RAPID URINE DRUG SCREEN, HOSP PERFORMED
Amphetamines: NOT DETECTED
Barbiturates: NOT DETECTED
Opiates: POSITIVE — AB

## 2010-07-15 LAB — URINALYSIS, ROUTINE W REFLEX MICROSCOPIC
Hgb urine dipstick: NEGATIVE
Nitrite: NEGATIVE
Protein, ur: NEGATIVE mg/dL
Specific Gravity, Urine: 1.015 (ref 1.005–1.030)
Urobilinogen, UA: 0.2 mg/dL (ref 0.0–1.0)

## 2010-07-15 LAB — GLUCOSE, CAPILLARY

## 2010-07-15 LAB — BASIC METABOLIC PANEL
Calcium: 9.7 mg/dL (ref 8.4–10.5)
Creatinine, Ser: 1.31 mg/dL — ABNORMAL HIGH (ref 0.4–1.2)
GFR calc Af Amer: 49 mL/min — ABNORMAL LOW (ref >60)
GFR calc non Af Amer: 40 mL/min — ABNORMAL LOW (ref >60)
Sodium: 139 mEq/L (ref 135–145)

## 2010-07-15 LAB — CBC
HCT: 33.9 % — ABNORMAL LOW (ref 36.0–46.0)
MCH: 25.6 pg — ABNORMAL LOW (ref 26.0–34.0)
MCV: 79.8 fL (ref 78.0–100.0)
Platelets: 382 10*3/uL (ref 150–400)
RBC: 4.25 MIL/uL (ref 3.87–5.11)
WBC: 11 10*3/uL — ABNORMAL HIGH (ref 4.0–10.5)

## 2010-07-15 LAB — MRSA PCR SCREENING: MRSA by PCR: NEGATIVE

## 2010-07-15 MED ORDER — TECHNETIUM TO 99M ALBUMIN AGGREGATED
6.0000 | Freq: Once | INTRAVENOUS | Status: AC | PRN
  Administered 2010-07-15: 5.2 via INTRAVENOUS

## 2010-07-15 NOTE — Group Therapy Note (Signed)
  NAMEALFREDA, HAMMAD                ACCOUNT NO.:  [id number]  MEDICAL RECORD NO.:  [id number]           PATIENT TYPE:  I  LOCATION:  A314                          FACILITY:  [facility]  PHYSICIAN:  Wilson Singer, M.D.DATE OF BIRTH:  12-Apr-1940  DATE OF PROCEDURE: DATE OF DISCHARGE:                                PROGRESS NOTE   SUBJECTIVE:  This lady was admitted yesterday with chest pain.  She has been continuing to have chest pain and partially relieved by intravenous morphine.  Her description is of central chest pain which is pressing in nature.  She looks very uncomfortable with this.  I think a description of the pain is cardiac in nature.  She apparently had a cardiac stress test 2 months ago, which was apparently normal.  She has a risk factors including diabetes, hypertension and obesity.  She is a nonsmoker.  OBJECTIVE:  VITAL SIGNS:  Temperature 97.8, blood pressure 148/87, pulse 108, saturation 94% on room air. HEART:  Sounds are present and do not appear to be in a gallop rhythm. LUNGS:  Fields are clear.  There is no pericardial pleural rub.  Jugular venous pressure is not raised.  INVESTIGATIONS:  Cardiac enzymes initially showed a troponin of less than 0.3, but her most recent one this morning shows a troponin of 0.55. Sodium 139, potassium 3.4, bicarbonate 28, BUN 28, creatinine 1.31. Hemoglobin 10.9, white blood cell count 11, platelets 382.  D-dimer was significantly elevated at 7.10.  Her electrocardiogram compared to yesterday I believe shows a change T-wave inversion in lead III and also somewhat normalization of flattening of the T-wave inversion in lead V4 and V5.  There is no acute frank ST elevation.  There is also T-wave inversion in lead III.  IMPRESSION:  Probable non-ST elevation myocardial infarction with ongoing cardiac anginal pain.  PLAN: 1. Move to the intensive care unit. 2. Start intravenous heparin and await V/Q scan in case this is a  pulmonary embolism, although I doubt so. 3. Start Nitropaste. 4. Cardiology consultation as soon as possible.     Wilson Singer, M.D.     NCG/MEDQ  D:  07/15/2010  T:  07/15/2010  Job:  [id number]  Electronically Signed by Lilly Cove M.D. on 07/15/2010 03:57:35 PM

## 2010-07-16 ENCOUNTER — Inpatient Hospital Stay (HOSPITAL_COMMUNITY): Payer: Medicare HMO

## 2010-07-16 DIAGNOSIS — I214 Non-ST elevation (NSTEMI) myocardial infarction: Secondary | ICD-10-CM

## 2010-07-16 LAB — CARDIAC PANEL(CRET KIN+CKTOT+MB+TROPI): CK, MB: 3.3 ng/mL (ref 0.3–4.0)

## 2010-07-16 LAB — COMPREHENSIVE METABOLIC PANEL
BUN: 14 mg/dL (ref 6–23)
Calcium: 8.8 mg/dL (ref 8.4–10.5)
Chloride: 103 mEq/L (ref 96–112)
Creatinine, Ser: 0.83 mg/dL (ref 0.4–1.2)
GFR calc Af Amer: >60 mL/min (ref >60)
GFR calc non Af Amer: >60 mL/min (ref >60)
Glucose, Bld: 130 mg/dL — ABNORMAL HIGH (ref 70–99)
Potassium: 3.9 mEq/L (ref 3.5–5.1)
Total Protein: 6.3 g/dL (ref 6.0–8.3)

## 2010-07-16 LAB — PROTIME-INR
INR: 1.08 (ref 0.00–1.49)
Prothrombin Time: 14.2 seconds (ref 11.6–15.2)

## 2010-07-16 LAB — HEPARIN LEVEL (UNFRACTIONATED): Heparin Unfractionated: 1.05 IU/mL — ABNORMAL HIGH (ref 0.30–0.70)

## 2010-07-16 LAB — GLUCOSE, CAPILLARY
Glucose-Capillary: 129 mg/dL — ABNORMAL HIGH (ref 70–99)
Glucose-Capillary: 116 mg/dL — ABNORMAL HIGH (ref 70–99)
Glucose-Capillary: 132 mg/dL — ABNORMAL HIGH (ref 70–99)

## 2010-07-16 MED ORDER — IOHEXOL 350 MG/ML SOLN
100.0000 mL | Freq: Once | INTRAVENOUS | Status: AC | PRN
  Administered 2010-07-16: 100 mL via INTRAVENOUS

## 2010-07-16 NOTE — Group Therapy Note (Signed)
  NAMESUNNI, RICHARDSON                ACCOUNT NO.:  [id number]  MEDICAL RECORD NO.:  [id number]           PATIENT TYPE:  I  LOCATION:  IC07                          FACILITY:  [facility]  PHYSICIAN:  Wilson Singer, M.D.DATE OF BIRTH:  05-02-1940  DATE OF PROCEDURE:  07/16/2010 DATE OF DISCHARGE:                                PROGRESS NOTE   This lady continues to have central chest pain overnight requiring intravenous morphine.  Dr. Dietrich Pates, Cardiology saw the patient and his feeling is that this is more likely a pulmonary embolism rather than cardiac in origin.  PHYSICAL EXAMINATION:  This morning temperature 98, blood pressure 141/71, pulse 99, saturation 96% on 3 L of oxygen.  Heart sounds are present and normal without gallop rhythm.  Jugular venous pressure is not raised.  There is no pericardial rub.  Lung fields are clear except she does have some inspiratory crackles at both bases.  There is no pleural rub.  There is no evidence of bronchial breathing.  INVESTIGATIONS:  Sodium 135, potassium 3.9, bicarbonate 26, BUN 14, creatinine 0.83.  Liver enzymes essentially normal.  Serial cardiac enzymes show a pattern of troponins reaching its height of 0.55 yesterday and then coming down to 0.42 and this morning 0.32. Hemoglobin A1c is rated as 6.7%.  IMPRESSION:  Chest pain of unclear etiology, pulmonary embolism versus non-ST elevation myocardial infarction, and ongoing anginal pain.  PLAN: 1. We will continue with intravenous heparin. 2. CT angiogram of the chest today as the profusion scan yesterday was     indeterminate probability of pulmonary embolism. 3. If the CT angiogram of the chest is negative, I would consider     cardiac catheterization.     Wilson Singer, M.D.     NCG/MEDQ  D:  07/16/2010  T:  07/16/2010  Job:  [id number]  Electronically Signed by Lilly Cove M.D. on 07/16/2010 01:34:31 PM

## 2010-07-17 ENCOUNTER — Inpatient Hospital Stay (HOSPITAL_COMMUNITY): Payer: Medicare HMO

## 2010-07-17 DIAGNOSIS — G4700 Insomnia, unspecified: Secondary | ICD-10-CM | POA: Insufficient documentation

## 2010-07-17 DIAGNOSIS — I369 Nonrheumatic tricuspid valve disorder, unspecified: Secondary | ICD-10-CM

## 2010-07-17 LAB — COMPREHENSIVE METABOLIC PANEL
ALT: 12 U/L (ref 0–35)
AST: 13 U/L (ref 0–37)
Alkaline Phosphatase: 124 U/L — ABNORMAL HIGH (ref 39–117)
CO2: 23 mEq/L (ref 19–32)
Calcium: 9.3 mg/dL (ref 8.4–10.5)
Chloride: 107 mEq/L (ref 96–112)
GFR calc Af Amer: >60 mL/min (ref >60)
GFR calc non Af Amer: >60 mL/min (ref >60)
Glucose, Bld: 137 mg/dL — ABNORMAL HIGH (ref 70–99)
Potassium: 4.1 mEq/L (ref 3.5–5.1)
Sodium: 138 mEq/L (ref 135–145)

## 2010-07-17 LAB — CBC
MCH: 25.5 pg — ABNORMAL LOW (ref 26.0–34.0)
MCV: 80.5 fL (ref 78.0–100.0)
Platelets: 305 10*3/uL (ref 150–400)
RDW: 15.6 % — ABNORMAL HIGH (ref 11.5–15.5)
WBC: 9.3 10*3/uL (ref 4.0–10.5)

## 2010-07-17 LAB — DIFFERENTIAL
Basophils Relative: 0 % (ref 0–1)
Eosinophils Absolute: 0 10*3/uL (ref 0.0–0.7)
Eosinophils Relative: 0 % (ref 0–5)
Lymphs Abs: 2.4 10*3/uL (ref 0.7–4.0)

## 2010-07-17 LAB — GLUCOSE, CAPILLARY: Glucose-Capillary: 129 mg/dL — ABNORMAL HIGH (ref 70–99)

## 2010-07-17 MED ORDER — IOHEXOL 300 MG/ML  SOLN
100.0000 mL | Freq: Once | INTRAMUSCULAR | Status: AC | PRN
  Administered 2010-07-17: 100 mL via INTRAVENOUS

## 2010-07-17 NOTE — Group Therapy Note (Signed)
  NAME:  [name], [name]                ACCOUNT NO.:  [id number]  MEDICAL RECORD NO.:  [id number]           PATIENT TYPE:  I  LOCATION:  IC07                          FACILITY:  [facility]  PHYSICIAN:  Wilson Singer, M.D.DATE OF BIRTH:  01-26-1941  DATE OF PROCEDURE: DATE OF DISCHARGE:                                PROGRESS NOTE   This lady had a CT angiogram of the chest yesterday, which showed that she has bilateral acute pulmonary emboli to all lobes with right heart strain shown.  There is a possible pulmonary infarct in the right middle lobe.  Her pain is somewhat better today and she did not seem to complain of this.  She has had a low-grade fever overnight, but appears to be afebrile now.  PHYSICAL EXAMINATION:  VITAL SIGNS:  Temperature is 98.9, blood pressure 138/87, pulse 105 in sinus rhythm, saturation 97% on 3 liters of oxygen. HEART:  Heart sounds are present with resting tachycardia. LUNGS:  Lung fields show that they are clear except for some scattered areas both sides, what I thought was bronchial breathing.  She does not have increased work of breathing at the present time.  INVESTIGATIONS:  Hemoglobin 9.0, white blood cell count 9.3, platelets 305.  Sodium 138, potassium 4.1, bicarbonate 23, BUN 6, creatinine 0.7.  IMPRESSION: 1. Acute bilateral pulmonary emboli. 2. Morbid obesity. 3. Acute renal failure, resolved. 4. Diabetes. 5. Hypertension.  PLAN: 1. Continue anticoagulation now with Lovenox and Coumadin. 2. I have ordered a CT scan of the abdomen and pelvis to make sure     there is no pelvic pathology accounting for     her thromboembolic disease.  Bilateral leg venous Dopplers were     negative. 3. Reduce intravenous fluids. 4. Move to telemetry.     Wilson Singer, M.D.     NCG/MEDQ  D:  07/17/2010  T:  07/17/2010  Job:  [id number]  Electronically Signed by Lilly Cove M.D. on 07/17/2010 10:20:58 AM

## 2010-07-17 NOTE — Assessment & Plan Note (Signed)
Medication compliance addressed. Commitment to regular exercise and healthy  food choices, with portion control discussed. DASH diet and low fat diet discussed and literature offered. Changes in medication made at this visit.  

## 2010-07-17 NOTE — Assessment & Plan Note (Signed)
Uncontrolled, will need statin therapy

## 2010-07-17 NOTE — Assessment & Plan Note (Signed)
Uncontrolled, dose increase on metformin

## 2010-07-17 NOTE — Assessment & Plan Note (Signed)
Deteriorated, pt has upcoming surgery, once cleared in the near future

## 2010-07-17 NOTE — Assessment & Plan Note (Signed)
Sleep hygiene discussed, pt already practicing good hygiene, restoril to be started

## 2010-07-17 NOTE — H&P (Signed)
[name]                ACCOUNT NO.:  [id number]  MEDICAL RECORD NO.:  [id number]           PATIENT TYPE:  I  LOCATION:  A314                          FACILITY:  [facility]  PHYSICIAN:  Tarry Kos, MD       DATE OF BIRTH:  06-May-1940  DATE OF ADMISSION:  07/14/2010 DATE OF DISCHARGE:  LH                             HISTORY & PHYSICAL   CHIEF COMPLAINT:  Chest pain.  HISTORY OF PRESENT ILLNESS:  Ms.  [name] is a 70 year old female who has been having substernal chest pain it is waxing and waning for about 24 hours now.  She states that it hurts when you press on her left breast, it hurts more when she is moving around, turning over.  She denies any rashes over the area or any recent trauma.  She says she had a cardiac stress test about 2 months ago which she reports as being normal by primary care physician.  However I cannot find any recent stress testing that are in our records here.  However, she says it was normal.  She denies ever having a heart attack or stroke before.  She denies any shortness of breath.  Denies any lower extremity edema.  Denies any fevers, nausea, vomiting, or diarrhea.  REVIEW OF SYSTEMS:  Negative.  PAST MEDICAL HISTORY: 1. Diabetes. 2. GERD. 3. Hypertension. 4. Migraine headaches. 5. According to [facility] note in July 2007, she did have a     normal Myoview Cardiolite done, and she has a history of AFib with     RVR. 6. She has a history of syncopal episode in the past.  ALLERGIES:  SULFA causes a rash.  MEDICATIONS: 1. Losartan/hydrochlorothiazide. 2. Metformin. 3. Imipramine. 4. Topiramate. 5. Propranolol. 6. KCl. 7. Prilosec. 8. Norvasc. 9. Lasix.  SOCIAL HISTORY:  She denies smoking.  No alcohol.  No IV drug abuse. Her husband is with her in the emergency department now.  There is no temperature taken in the emergency department.  On the floor it is 98.4, pulse 91, respirations 20, blood pressure 93/59.  There is  a documented O2 sat when she first got here 80% on room air on 3 liters nasal cannula is over 95%.  PHYSICAL EXAMINATION:  GENERAL:  She is alert and oriented x4.  No apparent distress, cooperative and friendly. HEENT:  Extraocular muscles are intact.  Pupils are equal and reactive to light.  Oropharynx is clear.  Mucous membranes are moist. NECK:  No JVD.  No carotid bruits. COR:  Regular rate and rhythm without murmurs, rubs, or gallops. CHEST:  Clear to auscultation bilaterally.  No wheeze, rhonchi, or rales. ABDOMEN:  Soft, nontender, and nondistended.  Positive bowel sounds.  No hepatosplenomegaly. EXTREMITIES:  No clubbing, cyanosis, or edema. PSYCHIATRIC:  Normal mood and affect. NEURO:  No focal neurologic deficits. SKIN:  No rashes.  LABORATORY DATA:  Chest x-ray is negative for any focal infiltrate or evidence of overt heart failure.  White count is 11, hemoglobin is 10.90.  Cardiac enzymes are negative.  BUN and creatinine is 32 and 1.68, potassium is 3.1, D-dimer  is elevated.  A 12-lead EKG normal sinus rhythm with some T-wave inversion in V2, V3, and V4.  ASSESSMENT/PLAN:  This is a 70 year old female with atypical chest pain. 1. Atypical chest pain also in the setting of hypoxia.  Chest x-ray is     negative.  I am going to V/Q scan to rule out pulmonary embolism     and order place her on full-dose Lovenox at this point if she rules     out for both myocardial infarction and pulmonary embolism.  Repeat     a 12-lead EKG in the morning, and rule out with serial cardiac     enzymes.  We will also repeat AP and lateral chest x-ray in the     morning and obtain cardiac stress test that was done.  She says     according to her by a primary care physician 2 months ago. 2. Acute renal failure.  I did not know what her baseline renal     function is according to records here, the last labs were in August     of 2005, and I do not have anything since then and her  creatinine     was 1.  I am going to place her on some IV fluids overnight and     repeat BMP in the morning.  Also add on a urine drug screen,     urinalysis, hemoglobin A1c, and a BNP. 3. She is a full code.  Further recommendation pending on overall     hospital course.                                           ______________________________ Tarry Kos, MD     RD/MEDQ  D:  07/15/2010  T:  07/15/2010  Job:  [id number]  Electronically Signed by Tarry Kos MD on 07/17/2010 11:55:57 AM

## 2010-07-18 LAB — COMPREHENSIVE METABOLIC PANEL
AST: 30 U/L (ref 0–37)
Albumin: 3.1 g/dL — ABNORMAL LOW (ref 3.5–5.2)
Calcium: 9.7 mg/dL (ref 8.4–10.5)
Creatinine, Ser: 0.89 mg/dL (ref 0.4–1.2)
GFR calc Af Amer: >60 mL/min (ref >60)
Sodium: 137 mEq/L (ref 135–145)
Total Protein: 7.2 g/dL (ref 6.0–8.3)

## 2010-07-18 LAB — GLUCOSE, CAPILLARY
Glucose-Capillary: 148 mg/dL — ABNORMAL HIGH (ref 70–99)
Glucose-Capillary: 176 mg/dL — ABNORMAL HIGH (ref 70–99)
Glucose-Capillary: 147 mg/dL — ABNORMAL HIGH (ref 70–99)

## 2010-07-18 LAB — PROTIME-INR: INR: 1.04 (ref 0.00–1.49)

## 2010-07-18 NOTE — Group Therapy Note (Signed)
  [name], [name]                ACCOUNT NO.:  [id number]  MEDICAL RECORD NO.:  [id number]           PATIENT TYPE:  I  LOCATION:  A310                          FACILITY:  [facility]  PHYSICIAN:  Wilson Singer, M.D.DATE OF BIRTH:  1940/04/16  DATE OF PROCEDURE:  07/18/2010 DATE OF DISCHARGE:                                PROGRESS NOTE   This lady actually is improving and she has been mobile and has had a bath.  She still get somewhat short of breath on exertion but her chest pains have gone now.  She did have a CT scan of her abdomen and pelvis yesterday but there was no evidence of the source of thromboembolic disease.  In particular, there was no malignancy seen and no DVT seen.  PHYSICAL EXAMINATION:  Today; VITAL SIGNS:  Temperature 98.2, blood pressure 112/72, pulse 70, saturation 95% on 2 L oxygen. HEART:  Sounds are present and normal. RESPIRATORY:  Lung fields are actually clear except for a few crackles at the left mid and lower zones. INVESTIGATIONS:  Sodium 137, potassium 4.1, bicarbonate 26, BUN 8, creatinine 0.89, INR barely moving at 1.04.  IMPRESSION: 1. Acute bilateral pulmonary emboli. 2. Morbid obesity. 3. Diabetes. 4. Hypertension.  PLAN: 1. Continue anticoagulation. 2. Mobilize.     Wilson Singer, M.D.     NCG/MEDQ  D:  07/18/2010  T:  07/18/2010  Job:  [id number]  Electronically Signed by Lilly Cove M.D. on 07/18/2010 02:18:26 PM

## 2010-07-19 LAB — DIFFERENTIAL
Basophils Absolute: 0 10*3/uL (ref 0.0–0.1)
Basophils Relative: 0 % (ref 0–1)
Lymphocytes Relative: 30 % (ref 12–46)
Neutro Abs: 4.7 10*3/uL (ref 1.7–7.7)
Neutrophils Relative %: 62 % (ref 43–77)

## 2010-07-19 LAB — GLUCOSE, CAPILLARY
Glucose-Capillary: 129 mg/dL — ABNORMAL HIGH (ref 70–99)
Glucose-Capillary: 124 mg/dL — ABNORMAL HIGH (ref 70–99)
Glucose-Capillary: 81 mg/dL (ref 70–99)

## 2010-07-19 LAB — BASIC METABOLIC PANEL
BUN: 12 mg/dL (ref 6–23)
GFR calc Af Amer: >60 mL/min (ref >60)
GFR calc non Af Amer: >60 mL/min (ref >60)
Potassium: 4 mEq/L (ref 3.5–5.1)
Sodium: 138 mEq/L (ref 135–145)

## 2010-07-19 LAB — CBC
Hemoglobin: 9.2 g/dL — ABNORMAL LOW (ref 12.0–15.0)
RBC: 3.55 MIL/uL — ABNORMAL LOW (ref 3.87–5.11)
WBC: 7.5 10*3/uL (ref 4.0–10.5)

## 2010-07-19 LAB — PROTIME-INR
INR: 1.38 (ref 0.00–1.49)
Prothrombin Time: 17.2 seconds — ABNORMAL HIGH (ref 11.6–15.2)

## 2010-07-19 LAB — FERRITIN: Ferritin: 22 ng/mL (ref 10–291)

## 2010-07-19 LAB — IRON AND TIBC: Saturation Ratios: 5 % — ABNORMAL LOW (ref 20–55)

## 2010-07-19 LAB — FOLATE: Folate: 17.8 ng/mL

## 2010-07-19 NOTE — Group Therapy Note (Signed)
  [name], [name]                ACCOUNT NO.:  [id number]  MEDICAL RECORD NO.:  [id number]           PATIENT TYPE:  I  LOCATION:  A310                          FACILITY:  [facility]  PHYSICIAN:  Wilson Singer, M.D.DATE OF BIRTH:  17-Nov-1940  DATE OF PROCEDURE:  07/19/2010 DATE OF DISCHARGE:                                PROGRESS NOTE   This lady continues to improve and she walked the hallway and mobilized well yesterday.  She did desaturate when her oxygen was taken off, so I am sure she will require oxygen when she goes home.  She does not have any chest pain anymore.  PHYSICAL EXAMINATION:  VITAL SIGNS:  Temperature 98.7, blood pressure 159/98, pulse 100, saturation 92% on 2 L oxygen. CARDIAC:  Heart sounds are present and normal. LUNGS:  Lung fields are actually clear with just a few crackles heard again at the left mid zone.  IMPRESSION: 1. Acute bilateral pulmonary emboli. 2. Hypertension uncontrolled. 3. Morbid obesity. 4. Diabetes controlled.  PLAN: 1. Increase Norvasc to 10 mg daily. 2. Discontinue Foley catheter. 3. Discontinue telemetry. 4. Continue anticoagulation.     Wilson Singer, M.D.     NCG/MEDQ  D:  07/19/2010  T:  07/19/2010  Job:  [id number]  Electronically Signed by Lilly Cove M.D. on 07/19/2010 03:10:41 PM

## 2010-07-20 ENCOUNTER — Inpatient Hospital Stay (HOSPITAL_COMMUNITY): Payer: Medicare HMO

## 2010-07-20 LAB — GLUCOSE, CAPILLARY

## 2010-07-20 NOTE — Discharge Summary (Signed)
[name], [name]                ACCOUNT NO.:  [id number]  MEDICAL RECORD NO.:  [id number]  LOCATION:  A310                          FACILITY:  [facility]  PHYSICIAN:  Wilson Singer, M.D.DATE OF BIRTH:  02-Aug-1940  DATE OF ADMISSION:  07/15/2010 DATE OF DISCHARGE:  06/04/2012LH                              DISCHARGE SUMMARY   FINAL DISCHARGE DIAGNOSES: 1. Acute bilateral pulmonary emboli, on anticoagulation now. 2. Iron deficiency anemia, needs outpatient investigation. 3. Hypertension, controlled. 4. Diabetes, controlled. 5. Morbid obesity.  CONDITION ON DISCHARGE:  Stable.  MEDICATIONS ON DISCHARGE: 1. Enoxaparin 150 mg daily. 2. Warfarin 7.5 mg daily. 3. Imipramine 25 mg 4 tablets nightly. 4. Furosemide 40 mg 1/2-1 tablet daily p.r.n. 5. Losartan/hydrochlorothiazide 50/12.5 one tablet b.i.d. 6. Metformin 500 mg b.i.d. 7. Amlodipine 10 mg daily. 8. Prilosec 40 mg daily. 9. Propranolol 80 mg daily. 10.Systane eye drops 1-2 drops both eyes daily. 11.Topiramate 100 mg b.i.d. 12.Travatan eye drops both eyes daily.  CONDITION ON DISCHARGE:  Stable.  HISTORY:  This is a very pleasant 70 year old lady who was admitted with chest pain.  Please see initial history and physical examination done by Dr. Tarry Kos.  HOSPITAL PROGRESS:  The initial thinking was that she may have a pulmonary embolism, but V/Q scan or only the perfusion part that was able to be done was not very convincing of such.  Description of her pain also sounded like cardiac in nature and it was felt that initial cardiac enzymes might be positive for non-ST-elevation MI.  She also was in renal failure on admission and after hydration her renal function did improve.  Therefore, she was able to undergo a CT angiogram of the chest.  This clearly showed that she had multiple bilateral pulmonary emboli.  There was a possibility even on pulmonary infarct on the right side.  She was immediately started on  anticoagulation full-dose with Lovenox and warfarin.  She has done well from this standpoint and her chest pain has disappeared and her respiratory and breathing has much improved and she is able to mobilize better.  We did do bilateral venous leg Dopplers which were negative for DVT.  Also, she had a CT of the abdomen and pelvis, but this did not show any evidence of malignancy nor any deep vein thrombosis in the pelvic area.  The working diagnosis would be leg DVTs which were embolized to her lungs.  Today, she feels very well and she has been mobilizing and is very keen to go home. Throughout her hospital stay, she was noted to be anemic and anemia panel has shown that she has a picture consistent with iron deficiency with iron level of only 16, saturation only 5%, and ferritin level in the low normal range at 22 only.  PHYSICAL EXAMINATION:  VITAL SIGNS:  Today, temperature 98.2, blood pressure 119/82, pulse 100, and saturation 97% on 2 liters of oxygen. GENERAL:  She looks systemically well. LUNGS:  Lung fields are clear without any pleural rub or crackles.  INVESTIGATIONS:  INR today is 1.87 almost in the therapeutic range. Electrolytes from yesterday shows sodium of 138, potassium 4.0, bicarbonate 27, BUN 12, and creatinine 0.81.  Hemoglobin 9.2, white blood cell count 7.5, and platelets 287.  Chest x-ray today is without any acute abnormalities.  Her heart is enlarged on the x-ray which I am not surprised.  She has had an echocardiogram during this hospitalization on July 17, 2010, and this showed that she had moderate left ventricular hypertrophy with ejection fraction normal at 65-70%. There was grade 1 diastolic dysfunction.  Pulmonary artery pressure was increased at 66 mmHg.  DISPOSITION:  The patient is now stable to be discharged home with enoxaparin to cover her while the warfarin reaches therapeutic state. The therapeutic INR should be between 2-3.  I would suggest  that her warfarin be continued for at least 6 months, but more likely indefinitely.  As far as her iron deficiency anemia is concerned, she does need investigation of this by GI consultation.  However, she tells me she has an upcoming colonoscopy in August, so I trust this is being taken care of.     Wilson Singer, M.D.     NCG/MEDQ  D:  07/20/2010  T:  07/20/2010  Job:  [id number]  cc:   Milus Mallick. Lodema Hong, M.D. Fax: [phone]  Gerrit Friends. Dietrich Pates, MD, [facility] [address] [place], [place] [address]  Electronically Signed by Lilly Cove M.D. on 07/20/2010 12:09:31 PM

## 2010-07-21 ENCOUNTER — Ambulatory Visit: Payer: Self-pay | Admitting: *Deleted

## 2010-07-21 DIAGNOSIS — I2699 Other pulmonary embolism without acute cor pulmonale: Secondary | ICD-10-CM

## 2010-07-21 DIAGNOSIS — Z7901 Long term (current) use of anticoagulants: Secondary | ICD-10-CM

## 2010-07-21 HISTORY — DX: Long term (current) use of anticoagulants: Z79.01

## 2010-07-22 ENCOUNTER — Ambulatory Visit (INDEPENDENT_AMBULATORY_CARE_PROVIDER_SITE_OTHER): Payer: Medicare HMO | Admitting: *Deleted

## 2010-07-22 DIAGNOSIS — I2699 Other pulmonary embolism without acute cor pulmonale: Secondary | ICD-10-CM

## 2010-07-22 DIAGNOSIS — Z7901 Long term (current) use of anticoagulants: Secondary | ICD-10-CM

## 2010-07-24 ENCOUNTER — Ambulatory Visit: Payer: Self-pay | Admitting: *Deleted

## 2010-07-24 ENCOUNTER — Telehealth: Payer: Self-pay

## 2010-07-24 DIAGNOSIS — Z7901 Long term (current) use of anticoagulants: Secondary | ICD-10-CM

## 2010-07-24 DIAGNOSIS — I2699 Other pulmonary embolism without acute cor pulmonale: Secondary | ICD-10-CM

## 2010-07-24 LAB — POCT INR: INR: 3

## 2010-07-24 NOTE — Telephone Encounter (Signed)
Patient aware. Said she was fine and that she had just come from the bathroom when the nurse came and that's why she was short of breath but she knows it will take time to feel better, told her to call if she needed [place] or to go back to ED is feeling worse

## 2010-07-24 NOTE — Telephone Encounter (Signed)
Noted, it takes time to recover strength

## 2010-07-24 NOTE — Telephone Encounter (Signed)
If she feels as though getting worse go back to ed for eval, however it takle time to recover strength, pls let her know she is in my thoughts

## 2010-07-24 NOTE — Telephone Encounter (Signed)
O2 was 84 but it went right up to 93 this morning. Hasn't got strength back since she was in the hospital, Vitals ok. CBG 146. Wanted you to know she has no swelling but she doesn't seem like she feels good, just has been short of breath and not feeling well  [phone] Jasmine December [phone]

## 2010-07-27 ENCOUNTER — Ambulatory Visit: Payer: Medicare HMO | Admitting: Family Medicine

## 2010-07-29 ENCOUNTER — Emergency Department (HOSPITAL_COMMUNITY): Payer: Medicare HMO

## 2010-07-29 ENCOUNTER — Telehealth: Payer: Self-pay | Admitting: *Deleted

## 2010-07-29 ENCOUNTER — Emergency Department (HOSPITAL_COMMUNITY)
Admission: EM | Admit: 2010-07-29 | Discharge: 2010-07-29 | Disposition: A | Discharge status: Home / Self Care | Payer: Medicare HMO | Attending: Emergency Medicine | Admitting: Emergency Medicine

## 2010-07-29 DIAGNOSIS — Z79899 Other long term (current) drug therapy: Secondary | ICD-10-CM | POA: Insufficient documentation

## 2010-07-29 DIAGNOSIS — Z86711 Personal history of pulmonary embolism: Secondary | ICD-10-CM | POA: Insufficient documentation

## 2010-07-29 DIAGNOSIS — K219 Gastro-esophageal reflux disease without esophagitis: Secondary | ICD-10-CM | POA: Insufficient documentation

## 2010-07-29 DIAGNOSIS — I10 Essential (primary) hypertension: Secondary | ICD-10-CM | POA: Insufficient documentation

## 2010-07-29 DIAGNOSIS — E119 Type 2 diabetes mellitus without complications: Secondary | ICD-10-CM | POA: Insufficient documentation

## 2010-07-29 DIAGNOSIS — Z7901 Long term (current) use of anticoagulants: Secondary | ICD-10-CM | POA: Insufficient documentation

## 2010-07-29 DIAGNOSIS — R079 Chest pain, unspecified: Secondary | ICD-10-CM | POA: Insufficient documentation

## 2010-07-29 LAB — DIFFERENTIAL
Eosinophils Absolute: 0 10*3/uL (ref 0.0–0.7)
Eosinophils Relative: 0 % (ref 0–5)
Lymphs Abs: 1.9 10*3/uL (ref 0.7–4.0)
Monocytes Relative: 5 % (ref 3–12)

## 2010-07-29 LAB — BASIC METABOLIC PANEL
Calcium: 10.3 mg/dL (ref 8.4–10.5)
Chloride: 99 mEq/L (ref 96–112)
Creatinine, Ser: 1.26 mg/dL — ABNORMAL HIGH (ref 0.4–1.2)
GFR calc Af Amer: 51 mL/min — ABNORMAL LOW (ref >60)
GFR calc non Af Amer: 42 mL/min — ABNORMAL LOW (ref >60)

## 2010-07-29 LAB — CBC
HCT: 32.6 % — ABNORMAL LOW (ref 36.0–46.0)
Hemoglobin: 10.5 g/dL — ABNORMAL LOW (ref 12.0–15.0)
MCHC: 32.2 g/dL (ref 30.0–36.0)

## 2010-07-29 LAB — TROPONIN I: Troponin I: <0.3 ng/mL (ref <0.30)

## 2010-07-29 LAB — CK TOTAL AND CKMB (NOT AT ARMC): Relative Index: INVALID (ref 0.0–2.5)

## 2010-07-29 LAB — PROTIME-INR: INR: 2.63 — ABNORMAL HIGH (ref 0.00–1.49)

## 2010-07-30 ENCOUNTER — Ambulatory Visit: Payer: Self-pay | Admitting: *Deleted

## 2010-07-30 DIAGNOSIS — I2699 Other pulmonary embolism without acute cor pulmonale: Secondary | ICD-10-CM

## 2010-07-30 DIAGNOSIS — Z7901 Long term (current) use of anticoagulants: Secondary | ICD-10-CM

## 2010-07-30 LAB — POCT INR: INR: 3.5

## 2010-08-03 ENCOUNTER — Ambulatory Visit (INDEPENDENT_AMBULATORY_CARE_PROVIDER_SITE_OTHER): Payer: Medicare HMO | Admitting: *Deleted

## 2010-08-03 ENCOUNTER — Encounter: Payer: Self-pay | Admitting: Adult Health

## 2010-08-03 ENCOUNTER — Ambulatory Visit (INDEPENDENT_AMBULATORY_CARE_PROVIDER_SITE_OTHER): Payer: Medicare HMO | Admitting: Adult Health

## 2010-08-03 VITALS — BP 126/74 | HR 101 | Temp 97.6°F | Ht 64.0 in | Wt 215.0 lb

## 2010-08-03 DIAGNOSIS — I2789 Other specified pulmonary heart diseases: Secondary | ICD-10-CM

## 2010-08-03 DIAGNOSIS — I2699 Other pulmonary embolism without acute cor pulmonale: Secondary | ICD-10-CM

## 2010-08-03 DIAGNOSIS — I10 Essential (primary) hypertension: Secondary | ICD-10-CM

## 2010-08-03 DIAGNOSIS — E119 Type 2 diabetes mellitus without complications: Secondary | ICD-10-CM

## 2010-08-03 DIAGNOSIS — D509 Iron deficiency anemia, unspecified: Secondary | ICD-10-CM

## 2010-08-03 DIAGNOSIS — D649 Anemia, unspecified: Secondary | ICD-10-CM

## 2010-08-03 DIAGNOSIS — Z7901 Long term (current) use of anticoagulants: Secondary | ICD-10-CM

## 2010-08-03 DIAGNOSIS — I272 Pulmonary hypertension, unspecified: Secondary | ICD-10-CM

## 2010-08-03 NOTE — Assessment & Plan Note (Signed)
She is to see Dr. Lodema Hong tomorrow. She is not on iron replacement at this time.  Would like to ask Dr.Simpson about this tomorrow. She will need blood work to assess for ongoing levels. Careful evaluation with coumadin for bleeding tendencies. She verbalizes understanding.

## 2010-08-03 NOTE — Patient Instructions (Signed)
Your physician recommends that you schedule a follow-up appointment in: 2 months  Your physician has requested that you have an echocardiogram. Echocardiography is a painless test that uses sound waves to create images of your heart. It provides your doctor with information about the size and shape of your heart and how well your heart's chambers and valves are working. This procedure takes approximately one hour. There are no restrictions for this procedure.

## 2010-08-03 NOTE — Progress Notes (Signed)
HPI:  [name] is a 70 y/o obese AAF we are following after seeing her on consultation for chest pain while hospitalized at [facility]. She was subsequently diagnosed with bilateral pulmonary emboli and placed on anticoagulation.  She was also found to have iron deficiency anemia, hypertension, diabetes, and morbid obesity.  She had not had a cardiac work-up or testing in the past.  Echocardiogram on July 17, 2010 demonstrated normal EF of 65%^-70%. With LvH and grade I diastolic dysfx.  She was also found to have increased PAP of 66 mmHg, probably as a result of PE.  Today she is feeling some better, but not at optimal energy level. She states she is tired all of the time, and easily fatigued just bathing.  Her breathing status is improved somewhat, but not like it had been. She had gone to the gym prior to PE diagnosis, and now is mostly staying at home. She has an appointment with Dr. Lodema Hong tomorrow. Was seen in ER last Wed (5 days ago) for neck pain and was diagnosed with a pinched nerve.  Allergies  Allergen Reactions  . Codeine   . Sulfonamide Derivatives     Current Outpatient Prescriptions  Medication Sig Dispense Refill  . amLODipine (NORVASC) 10 MG tablet Take 1 tablet (10 mg total) by mouth daily.  90 tablet  1  . Blood Glucose Monitoring Suppl (ACCU-CHEK AVIVA PLUS) W/DEVICE KIT 1 kit by Does not apply route daily. Once daily testing 250.00  1 kit  0  . furosemide (LASIX) 40 MG tablet Take 40 mg by mouth. Take one half to one tablet by mouth once daily as needed       . glucose blood (ACCU-CHEK AVIVA) test strip Once daily testing Dx:250.00  100 each  2  . HYDROcodone-acetaminophen (NORCO) 5-325 MG per tablet Take 1 tablet by mouth every 6 (six) hours as needed.       Marland Kitchen imipramine (TOFRANIL) 50 MG tablet Take 50 mg by mouth at bedtime. Take 2 tablets by mouth       . Lancets (ACCU-CHEK MULTICLIX) lancets Once daily testing Dx: 250.00  100 each  2  . latanoprost (XALATAN) 0.005 %  ophthalmic solution       . losartan-hydrochlorothiazide (HYZAAR) 50-12.5 MG per tablet Take 1 tablet by mouth daily.        . meclizine (ANTIVERT) 25 MG tablet Take 25 mg by mouth 3 (three) times daily as needed.        . metFORMIN (GLUCOPHAGE) 500 MG tablet Take 1 tablet (500 mg total) by mouth 2 (two) times daily with a meal.  180 tablet  3  . omeprazole (PRILOSEC) 40 MG capsule Take 40 mg by mouth daily.        . potassium gluconate 595 MG TABS Take 595 mg by mouth daily.        . propranolol (INDERAL) 80 MG tablet Take 1 tablet (80 mg total) by mouth daily.  90 tablet  1  . SUMAtriptan (IMITREX) 100 MG tablet Take 1 tablet (100 mg total) by mouth every 2 (two) hours as needed for migraine.  27 tablet  1  . topiramate (TOPAMAX) 100 MG tablet Take 100 mg by mouth 2 (two) times daily.        Marland Kitchen warfarin (COUMADIN) 7.5 MG tablet Take by mouth as directed.        Marland Kitchen DISCONTD: enoxaparin (LOVENOX) 150 MG/ML injection       . DISCONTD: levETIRAcetam (KEPPRA) 500  MG tablet Take 500 mg by mouth 2 (two) times daily.        Marland Kitchen DISCONTD: losartan-hydrochlorothiazide (HYZAAR) 100-25 MG per tablet Take 1 tablet by mouth daily.  30 tablet  1  . DISCONTD: temazepam (RESTORIL) 30 MG capsule Take 1 capsule (30 mg total) by mouth at bedtime as needed.  90 capsule  1    Past Medical History  Diagnosis Date  . Atrial fibrillation   . Other symptoms involving cardiovascular system   . Other and unspecified hyperlipidemia   . Unspecified essential hypertension   . Overweight   . Depressive disorder, not elsewhere classified   . Type II or unspecified type diabetes mellitus without mention of complication, not stated as uncontrolled   . Other persistent mental disorders due to conditions classified elsewhere   . Migraine, unspecified, without mention of intractable migraine without mention of status migrainosus   . Osteoarthrosis, unspecified whether generalized or localized, lower leg   . Esophageal reflux     . Glaucoma     Past Surgical History  Procedure Date  . Tonsillectomy   . Dilation and curettage of uterus   . Urethral dilation     GUY:QIHKVQ of systems complete and found to be negative unless listed above PHYSICAL EXAM General: Well developed, well nourished, in no acute distress Head: Eyes PERRLA, No xanthomas.   Normal cephalic and atramatic  Lungs: Clear bilaterally to auscultation and percussion. Poor inspiratory effort. Heart: HRRR S1 S2, slightly tachycardic.  Pulses are 2+ & equal.            No carotid bruit. No JVD.  No abdominal bruits. No femoral bruits. Abdomen: Bowel sounds are positive, abdomen soft and non-tender without masses or                  Hernia's noted. Msk:  Back normal, normal gait. Normal strength and tone for age. Extremities: No clubbing, cyanosis or edema.  DP +1 Neuro: Alert and oriented X 3. Psych:  Good affect, responds appropriately :  ASSESSMENT AND PLAN

## 2010-08-03 NOTE — Assessment & Plan Note (Signed)
She is better but still extremely fatigued and mildly short of breath.  She continues in our [place] office coumadin clinic to maintain therapeutic INR. I would like to follow her in 2 months with Echo at that time to assess PA pressures.  She is advised if her breathing status gets worse or she has more chest pain she is to call us or go to ER.

## 2010-08-03 NOTE — Assessment & Plan Note (Signed)
She is well controlled on current medication regimen.  Will make no changes at this time.

## 2010-08-04 ENCOUNTER — Encounter: Payer: Self-pay | Admitting: Family Medicine

## 2010-08-05 ENCOUNTER — Ambulatory Visit (INDEPENDENT_AMBULATORY_CARE_PROVIDER_SITE_OTHER): Payer: Medicare HMO | Admitting: Family Medicine

## 2010-08-05 ENCOUNTER — Encounter: Payer: Self-pay | Admitting: Family Medicine

## 2010-08-05 VITALS — BP 130/82 | HR 82 | Resp 16 | Ht 65.0 in | Wt 215.4 lb

## 2010-08-05 DIAGNOSIS — G43909 Migraine, unspecified, not intractable, without status migrainosus: Secondary | ICD-10-CM

## 2010-08-05 DIAGNOSIS — Z23 Encounter for immunization: Secondary | ICD-10-CM

## 2010-08-05 DIAGNOSIS — K219 Gastro-esophageal reflux disease without esophagitis: Secondary | ICD-10-CM

## 2010-08-05 DIAGNOSIS — IMO0002 Reserved for concepts with insufficient information to code with codable children: Secondary | ICD-10-CM

## 2010-08-05 DIAGNOSIS — M1710 Unilateral primary osteoarthritis, unspecified knee: Secondary | ICD-10-CM

## 2010-08-05 DIAGNOSIS — E119 Type 2 diabetes mellitus without complications: Secondary | ICD-10-CM

## 2010-08-05 DIAGNOSIS — I2699 Other pulmonary embolism without acute cor pulmonale: Secondary | ICD-10-CM

## 2010-08-05 DIAGNOSIS — E785 Hyperlipidemia, unspecified: Secondary | ICD-10-CM

## 2010-08-05 DIAGNOSIS — I10 Essential (primary) hypertension: Secondary | ICD-10-CM

## 2010-08-05 DIAGNOSIS — D509 Iron deficiency anemia, unspecified: Secondary | ICD-10-CM

## 2010-08-05 MED ORDER — FERROUS SULFATE 325 (65 FE) MG PO TABS: 325.0000 mg | ORAL_TABLET | Freq: Two times a day (BID) | ORAL | Status: DC

## 2010-08-05 MED ORDER — OMEPRAZOLE 40 MG PO CPDR: 40.0000 mg | DELAYED_RELEASE_CAPSULE | Freq: Every day | ORAL | Status: DC

## 2010-08-05 MED ORDER — PROPRANOLOL HCL 80 MG PO TABS: 80.0000 mg | ORAL_TABLET | Freq: Every day | ORAL | Status: DC

## 2010-08-05 MED ORDER — METFORMIN HCL 500 MG PO TABS: 500.0000 mg | ORAL_TABLET | Freq: Two times a day (BID) | ORAL | Status: DC

## 2010-08-05 MED ORDER — LOSARTAN POTASSIUM-HCTZ 50-12.5 MG PO TABS: 1.0000 | ORAL_TABLET | Freq: Every day | ORAL | Status: DC

## 2010-08-05 MED ORDER — IMIPRAMINE HCL 50 MG PO TABS
50.0000 mg | ORAL_TABLET | Freq: Every day | ORAL | Status: DC
Start: 2010-08-05 — End: 2010-12-08

## 2010-08-05 MED ORDER — AMLODIPINE BESYLATE 10 MG PO TABS: 10.0000 mg | ORAL_TABLET | Freq: Every day | ORAL | Status: DC

## 2010-08-05 NOTE — Patient Instructions (Addendum)
F/U in 2 months.  i am thankful that you are feeling, better, pls ensure you take the coumadin as prescribed and keep it checked regularly.  You need to keep apppt in August for the colonscopy, however you will not be able to have the procedure till the coumadin is ended.   I Iwill check your iron  And then prescribe med   I

## 2010-08-10 ENCOUNTER — Telehealth: Payer: Self-pay | Admitting: Family Medicine

## 2010-08-10 DIAGNOSIS — E119 Type 2 diabetes mellitus without complications: Secondary | ICD-10-CM

## 2010-08-10 MED ORDER — METFORMIN HCL 500 MG PO TABS: 500.0000 mg | ORAL_TABLET | Freq: Two times a day (BID) | ORAL | Status: DC

## 2010-08-10 NOTE — Telephone Encounter (Signed)
Med sent as requested 

## 2010-08-17 ENCOUNTER — Ambulatory Visit (INDEPENDENT_AMBULATORY_CARE_PROVIDER_SITE_OTHER): Payer: Medicare HMO | Admitting: *Deleted

## 2010-08-17 DIAGNOSIS — Z7901 Long term (current) use of anticoagulants: Secondary | ICD-10-CM

## 2010-08-17 DIAGNOSIS — I2699 Other pulmonary embolism without acute cor pulmonale: Secondary | ICD-10-CM

## 2010-08-17 MED ORDER — WARFARIN SODIUM 7.5 MG PO TABS: 7.5000 mg | ORAL_TABLET | ORAL | Status: DC

## 2010-08-17 NOTE — Consult Note (Signed)
[name], [name]                ACCOUNT NO.:  [id number]  MEDICAL RECORD NO.:  [id number]           PATIENT TYPE:  I  LOCATION:  IC07                          FACILITY:  [facility]  PHYSICIAN:  [name] Friends. Dietrich Pates, MD, FACCDATE OF BIRTH:  1940-03-10  DATE OF CONSULTATION:  07/15/2010 DATE OF DISCHARGE:                                CONSULTATION   PRIMARY CARDIOLOGIST:  [name] Friends. Dietrich Pates, MD, [facility]  PRIMARY CARE PHYSICIAN:  [name].  REASON FOR CONSULTATION:  Chest pain with positive cardiac enzymes.  HISTORY OF PRESENT ILLNESS:  This is a 70 year old obese African American female admitted with severe chest pain associated with shortness of breath, nausea, and diaphoresis.  The pain occurred with minimal exertion.  The patient was admitted to rule out cardiac etiology and cardiac enzymes were cycled.  While on telemetry on the third floor, she got up to use the bathroom, began to have severe chest pain with shortness of breath and diaphoresis.  She was transferred to ICU, placed on heparin drip, and continued on nitroglycerin.  A V/Q scan was completed with an elevated D-dimer at 7.10 and revealed intermediate probability of PE (20%-79%).  This was only perfusion study with no ventilation study.  The patient has continued to have intermittent chest discomfort and is noted to have inferior Q-waves which were new from prior EKGs.  We were asked to make further recommendations in this setting.  REVIEW OF SYSTEMS:  Positive for sweating, shortness of breath, chest pain, dyspnea on exertion, presyncope, and nausea.  All other systems are reviewed and found to be negative.  CODE STATUS:  Full code.  SOCIAL HISTORY:  She lives in [place] with her husband.  She has several children.  She does not smoke, drink, or use drugs.  FAMILY HISTORY:  Unobtainable at this time.  PAST MEDICAL HISTORY: 1. Diabetes. 2. Hypertension. 3. Obesity. 4. Hyperlipidemia. 5. Paroxysmal atrial  fibrillation with RVR without Coumadin. 6. GERD.  Most recent stress test performed through our practice was normal in 2007.  She apparently had a recent stress test 2 weeks ago with the [facility] which was also reported to be normal.  We are requesting records.  MEDICATIONS PRIOR TO ADMISSION: 1. Lasix 40 mg one-half to 1 tablet daily p.r.n. 2. Norvasc 10 mg p.o. daily. 3. Prilosec 40 mg p.o. daily. 4. Potassium 99 mcg daily. 5. Propranolol 80 mg by mouth daily. 6. Topiramate 100 mg b.i.d. 7. Systane to both eyes 2 drops daily as needed. 8. Travatan ophthalmic 0.004% to both eyes daily. 9. Imipramine 25 mg 4 tablets daily at bedtime. 10.Metformin 500 mg b.i.d. 11.Losartan/HCTZ 50/12.5 mg b.i.d.  ALLERGIES: 1. CODEINE. 2. SULFA.  CURRENT LABORATORY DATA:  Sodium 139, potassium 3.4, chloride 101, CO2 of 28, BUN 28, creatinine 1.3, and glucose 146.  Hemoglobin 10.9, hematocrit 33.9, white blood cells 11.0, and platelets 382.  D-dimer 7.10.  BNP 229.  Troponin less than 0.30, 0.30, and 0.55.  EKG revealing sinus tachycardia with a rate of 91 beats per minute, inferior Q-waves are noted.  Anterior T-wave depression with lateral flattening.  X-RAYS:  Chest x-ray revealing cardiomegaly without CHF.  V/Q lung; intermediate probability of PE.  PHYSICAL EXAMINATION:  VITAL SIGNS:  Blood pressure 127/84, pulse 94, respirations 20, temperature 97.4, and O2 saturation 97% on 3 liters, weight 97.5 kg. GENERAL:  She is awake, alert, and oriented, complaining of chest pressure. HEENT:  Head is normocephalic and atraumatic.  Eyes:  PERRLA. NECK:  Supple.  No thyromegaly.  No bruit.  No JVD. CARDIOVASCULAR:  Regular rate and rhythm.  Tachycardic without murmurs, rubs, or gallops. LUNGS:  Mildly diminished with mild inspiratory wheezes. EXTREMITIES:  Pulses in the lower extremities mildly to diminished, cool to touch.  Extremities are without clubbing, cyanosis  or edema. ABDOMEN:  Obese and nontender with 2+ bowel sounds. MUSCULOSKELETAL:  Some joint deformity noted in the knees. NEUROLOGIC:  Cranial nerves II through XII are grossly intact.  IMPRESSION:  Chest pain, rule out cardiac etiology, mildly positive cardiac enzymes on the third set with positive D-dimer with intermediate probability of pulmonary embolism, but no ventilation study completed. She has multiple cardiovascular risk factors to include hypertension, obesity, diabetes, and cholesterol.  We will rule out pulmonary embolism and deep vein thrombosis first.  If negative, plan cardiac catheterization.  We will hold metformin.  Continue nitroglycerin and heparin and make further recommendations based upon hospital course and response to treatment along with test results.     [name] Mare. Lyman Bishop, [name]   ______________________________ [name] Friends. Dietrich Pates, MD, [facility]    KML/MEDQ  D:  07/15/2010  T:  07/16/2010  Job:  [id number]  cc:   [name]  Electronically Signed by [name] on 07/31/2010 01:07:14 PM Electronically Signed by [name] Bing MD [facility] on 08/17/2010 05:16:07 PM

## 2010-08-20 ENCOUNTER — Encounter: Payer: Self-pay | Admitting: Family Medicine

## 2010-08-20 MED ORDER — HYDROCODONE-ACETAMINOPHEN 7.5-500 MG PO TABS: 1.0000 | ORAL_TABLET | Freq: Three times a day (TID) | ORAL | Status: DC | PRN

## 2010-08-20 MED ORDER — HYDROCODONE-ACETAMINOPHEN 5-325 MG PO TABS: 1.0000 | ORAL_TABLET | Freq: Four times a day (QID) | ORAL | Status: DC | PRN

## 2010-08-20 NOTE — Progress Notes (Signed)
  Subjective:    Patient ID: [name], female    DOB: 01-19-1941, 70 y.o.   MRN: [id number]  HPI Pt in for f/u of recent hospitaization for bilateral PE. She present ed acutely to the ED several weeks ago with severe chest pain and dyspnea. She states she had a hard time , has never been so sick , and is visibly shaken due to how acutely ill she had become. Currently being followed  By the coumadin for  anticoagulation for at least the next 6 months She states her apetite is fair and that her blood sugars are remaining within a fairly good range   Review of Systems Denies recent fever or chills. Denies sinus pressure, nasal congestion, ear pain or sore throat. Denies chest congestion, productive cough or wheezing. Intermittent , palpitations,denies  paroxysmal nocturnal dyspnea, orthopnea and leg swelling Denies abdominal pain, nausea, vomiting,diarrhea or constipation.  Denies rectal bleeding or change in bowel movement. Denies dysuria, frequency, hesitancy or incontinence. Chronic back pain, was for right knee replacement, however this is clearly delayed until her treatment for PE is complete. Denies seizure, numbness, or tingling.She does have chronic migarines which are better controlled on topamax , and respond to immitrex she needs his refilled Denies depression,she reports increased  anxiety  currently Denies skin break down or rash.        Objective:   Physical Exam Patient alert and oriented and in no Cardiopulmonary distress.  HEENT: No facial asymmetry, EOMI, no sinus tenderness, TM's clear, Oropharynx pink and moist.  Neck supple no adenopathy.  Chest: Clear to auscultation bilaterally.  CVS: S1, S2 no murmurs, no S3.  ABD: Soft non tender. Bowel sounds normal.  Ext: No edema  MS: decreased  ROM spine, shoulders, hips and knees.  Skin: Intact, no ulcerations or rash noted.  Psych: Good eye contact, normal affect. Memory intact  Anxious but not depressed  appearing.  CNS: CN 2-12 intact, power, tone and sensation normal throughout.        Assessment & Plan:

## 2010-08-20 NOTE — Assessment & Plan Note (Addendum)
Uncontroled, low fat diet discussed and encouraged

## 2010-08-20 NOTE — Assessment & Plan Note (Signed)
pt

## 2010-08-20 NOTE — Assessment & Plan Note (Signed)
C/o uncontrolled pain,requests refill on pain med

## 2010-08-20 NOTE — Assessment & Plan Note (Signed)
Controlled, no change in medication  

## 2010-08-20 NOTE — Assessment & Plan Note (Signed)
Currently on on iron supplement twice daily, states she feels much better.

## 2010-08-20 NOTE — Assessment & Plan Note (Signed)
Currently on long ter coumadin therapy, followed by coumadin clinic. Denies overt bleeding, less symptomatic though still c/o dyspnea

## 2010-08-21 ENCOUNTER — Other Ambulatory Visit: Payer: Self-pay | Admitting: *Deleted

## 2010-08-21 DIAGNOSIS — M1710 Unilateral primary osteoarthritis, unspecified knee: Secondary | ICD-10-CM

## 2010-08-21 MED ORDER — TOPIRAMATE 100 MG PO TABS: 100.0000 mg | ORAL_TABLET | Freq: Two times a day (BID) | ORAL | Status: DC

## 2010-08-21 MED ORDER — HYDROCODONE-ACETAMINOPHEN 5-325 MG PO TABS: ORAL_TABLET | ORAL | Status: DC

## 2010-08-21 NOTE — Progress Notes (Signed)
Refills sent per Dr Lodema Hong request

## 2010-08-31 ENCOUNTER — Ambulatory Visit (INDEPENDENT_AMBULATORY_CARE_PROVIDER_SITE_OTHER): Payer: Medicare HMO | Admitting: *Deleted

## 2010-08-31 DIAGNOSIS — I2699 Other pulmonary embolism without acute cor pulmonale: Secondary | ICD-10-CM

## 2010-08-31 DIAGNOSIS — Z7901 Long term (current) use of anticoagulants: Secondary | ICD-10-CM

## 2010-09-06 ENCOUNTER — Other Ambulatory Visit: Payer: Self-pay | Admitting: Family Medicine

## 2010-09-14 ENCOUNTER — Encounter: Payer: Medicare HMO | Admitting: *Deleted

## 2010-09-15 ENCOUNTER — Other Ambulatory Visit: Payer: Self-pay | Admitting: Family Medicine

## 2010-09-19 ENCOUNTER — Other Ambulatory Visit: Payer: Self-pay | Admitting: Family Medicine

## 2010-09-21 ENCOUNTER — Ambulatory Visit (HOSPITAL_COMMUNITY)
Admission: RE | Admit: 2010-09-21 | Discharge: 2010-09-21 | Disposition: A | Discharge status: Home / Self Care | Payer: Medicare HMO | Source: Ambulatory Visit | Attending: Adult Health | Admitting: Adult Health

## 2010-09-21 DIAGNOSIS — I2789 Other specified pulmonary heart diseases: Secondary | ICD-10-CM

## 2010-09-21 DIAGNOSIS — E785 Hyperlipidemia, unspecified: Secondary | ICD-10-CM | POA: Insufficient documentation

## 2010-09-21 DIAGNOSIS — I272 Pulmonary hypertension, unspecified: Secondary | ICD-10-CM

## 2010-09-21 DIAGNOSIS — E119 Type 2 diabetes mellitus without complications: Secondary | ICD-10-CM | POA: Insufficient documentation

## 2010-09-21 DIAGNOSIS — I10 Essential (primary) hypertension: Secondary | ICD-10-CM | POA: Insufficient documentation

## 2010-09-21 NOTE — Progress Notes (Signed)
*  PRELIMINARY RESULTS* Echocardiogram 2D Echocardiogram has been performed.  Conrad Tonalea 09/21/2010, 11:02 AM

## 2010-09-23 ENCOUNTER — Encounter: Payer: Medicare HMO | Admitting: *Deleted

## 2010-09-23 ENCOUNTER — Ambulatory Visit (INDEPENDENT_AMBULATORY_CARE_PROVIDER_SITE_OTHER): Payer: Medicare HMO | Admitting: *Deleted

## 2010-09-23 ENCOUNTER — Encounter: Payer: Self-pay | Admitting: Cardiology

## 2010-09-23 ENCOUNTER — Ambulatory Visit (INDEPENDENT_AMBULATORY_CARE_PROVIDER_SITE_OTHER): Payer: Medicare HMO | Admitting: Cardiology

## 2010-09-23 ENCOUNTER — Telehealth: Payer: Self-pay | Admitting: *Deleted

## 2010-09-23 VITALS — BP 149/99 | HR 80 | Ht 66.0 in | Wt 218.0 lb

## 2010-09-23 DIAGNOSIS — M1710 Unilateral primary osteoarthritis, unspecified knee: Secondary | ICD-10-CM

## 2010-09-23 DIAGNOSIS — Z7901 Long term (current) use of anticoagulants: Secondary | ICD-10-CM

## 2010-09-23 DIAGNOSIS — D509 Iron deficiency anemia, unspecified: Secondary | ICD-10-CM

## 2010-09-23 DIAGNOSIS — I2699 Other pulmonary embolism without acute cor pulmonale: Secondary | ICD-10-CM

## 2010-09-23 DIAGNOSIS — E785 Hyperlipidemia, unspecified: Secondary | ICD-10-CM

## 2010-09-23 DIAGNOSIS — I10 Essential (primary) hypertension: Secondary | ICD-10-CM

## 2010-09-23 MED ORDER — WARFARIN SODIUM 7.5 MG PO TABS: 7.5000 mg | ORAL_TABLET | ORAL | Status: DC

## 2010-09-23 NOTE — Assessment & Plan Note (Addendum)
Blood pressure is mildly elevated.  Patient will collect additional values and return to Dr. Lodema Hong for further assessment and treatment.

## 2010-09-23 NOTE — Assessment & Plan Note (Addendum)
INRs have been consistently therapeutic or supratherapeutic.  Since patient cannot afford a co-pay for Coumadin clinic, she will obtain INRs at [facility] with the results transmitted to our office for adjustment of warfarin dosage.  Serial CBCs and stool for Hemoccult testing will be obtained to exclude occult GI blood loss.  Patient was scheduled for colonoscopy around the time of her pulmonary embolism.  We will defer that test for one year.  I will see this nice woman again in 8 months.

## 2010-09-23 NOTE — Assessment & Plan Note (Signed)
Hemoglobin of 10.5 and hematocrit of 32.6 with an MCV of 79 in 07/2010, improved compared with one month earlier.   CBC will be reassessed with her next INR in one month along with iron studies.

## 2010-09-23 NOTE — Assessment & Plan Note (Addendum)
Repeat echocardiogram shows return of right ventricular size and function to normal.  RV systolic pressure not measured, but no evidence of pulmonary hypertension found.  This provides indirect evidence for resolution of massive or submassive pulmonary embolization.  I anticipate that anticoagulation will need to be continued indefinitely as long as it remains safe to do so.  I will reassess this nice woman in 9 months.

## 2010-09-23 NOTE — Patient Instructions (Signed)
Your physician recommends that you continue on your current medications as directed. Please refer to the Current Medication list given to you today.  Your physician recommends that you return for lab work in: 1 month  Your physician recommends that you schedule a follow-up appointment in: 9 months

## 2010-09-23 NOTE — Assessment & Plan Note (Addendum)
Control of hyperlipidemia is suboptimal and has deteriorated over the past year.  She has not been treated with pharmacologic agents, which are not necessarily indicated in hyperlipidemia of this magnitude without accompanying vascular disease.  Weight loss and diet will continue to be stressed.

## 2010-09-23 NOTE — Progress Notes (Signed)
HPI : [name] returns to the office as scheduled for continued assessment and treatment following pulmonary embolism.  She has done well subsequent to her hospitalization with chronic class II dyspnea on exertion that is probably not changed from her premorbid status.  She's had no chest discomfort, syncope, pedal edema or resting dyspnea.  She denies orthopnea and PND.  Treatment with warfarin has been uneventful except for the cost, which the patient cannot afford.  Due to the structure of her insurance and her coverage, a switch to dabigatran would not solve her problems.  Current Outpatient Prescriptions on File Prior to Visit  Medication Sig Dispense Refill  . amLODipine (NORVASC) 10 MG tablet Take 1 tablet (10 mg total) by mouth daily.  90 tablet  1  . Blood Glucose Monitoring Suppl (ACCU-CHEK AVIVA PLUS) W/DEVICE KIT 1 kit by Does not apply route daily. Once daily testing 250.00  1 kit  0  . ferrous sulfate 325 (65 FE) MG tablet Take 1 tablet (325 mg total) by mouth 2 (two) times daily.  180 tablet  1  . furosemide (LASIX) 40 MG tablet Take 40 mg by mouth. Take one half to one tablet by mouth once daily as needed       . glucose blood (ACCU-CHEK AVIVA) test strip Once daily testing Dx:250.00  100 each  2  . HYDROcodone-acetaminophen (NORCO) 5-325 MG per tablet One tablet  daily as needed for severe pain  30 tablet  5  . imipramine (TOFRANIL) 50 MG tablet Take 1 tablet (50 mg total) by mouth at bedtime. Take 2 tablets by mouth   180 tablet  1  . Lancets (ACCU-CHEK MULTICLIX) lancets Once daily testing Dx: 250.00  100 each  2  . latanoprost (XALATAN) 0.005 % ophthalmic solution       . losartan-hydrochlorothiazide (HYZAAR) 50-12.5 MG per tablet Take 1 tablet by mouth daily.  90 tablet  1  . meclizine (ANTIVERT) 25 MG tablet TAKE 1 TABLET BY MOUTH THREE TIMES A DAY AS NEEDED FOR DIZZINESS  30 tablet  1  . metFORMIN (GLUCOPHAGE) 500 MG tablet Take 1 tablet (500 mg total) by mouth 2 (two) times  daily with a meal.  180 tablet  0  . omeprazole (PRILOSEC) 40 MG capsule Take 1 capsule (40 mg total) by mouth daily.  90 capsule  1  . potassium gluconate 595 MG TABS Take 595 mg by mouth daily.        . propranolol (INDERAL) 80 MG tablet Take 1 tablet (80 mg total) by mouth daily.  90 tablet  1  . temazepam (RESTORIL) 30 MG capsule Take 30 mg by mouth at bedtime as needed.       . topiramate (TOPAMAX) 100 MG tablet Take 1 tablet (100 mg total) by mouth 2 (two) times daily.  180 tablet  1     Allergies  Allergen Reactions  . Codeine   . Sulfonamide Derivatives       Past medical history, social history, and family history reviewed and updated.  ROS: See history of present illness.  PHYSICAL EXAM: BP 149/99  Pulse 80  Ht 5\' 6"  (1.676 m)  Wt 98.884 kg (218 lb)  BMI 35.19 kg/m2  SpO2 95%  General-Well developed; no acute distress Body habitus-obese Neck-No JVD; no carotid bruits Lungs-clear lung fields; resonant to percussion Cardiovascular-normal PMI; distant S1 and S2 Abdomen-normal bowel sounds; soft and non-tender without masses or organomegaly Musculoskeletal-No deformities, no cyanosis or clubbing Neurologic-Normal cranial nerves;  symmetric strength and tone Skin-Warm, no significant lesions Extremities-distal pulses intact; trace edema  ASSESSMENT AND PLAN:

## 2010-09-28 ENCOUNTER — Ambulatory Visit: Payer: Medicare Other | Admitting: Family Medicine

## 2010-09-30 ENCOUNTER — Ambulatory Visit: Admit: 2010-09-30 | Payer: Self-pay | Admitting: Internal Medicine

## 2010-09-30 ENCOUNTER — Encounter (INDEPENDENT_AMBULATORY_CARE_PROVIDER_SITE_OTHER): Payer: Medicare Other | Admitting: Internal Medicine

## 2010-09-30 SURGERY — COLONOSCOPY
Anesthesia: Moderate Sedation

## 2010-10-07 ENCOUNTER — Ambulatory Visit (INDEPENDENT_AMBULATORY_CARE_PROVIDER_SITE_OTHER): Payer: Self-pay | Admitting: *Deleted

## 2010-10-07 ENCOUNTER — Encounter: Payer: Self-pay | Admitting: Family Medicine

## 2010-10-07 DIAGNOSIS — Z7901 Long term (current) use of anticoagulants: Secondary | ICD-10-CM

## 2010-10-07 DIAGNOSIS — I2699 Other pulmonary embolism without acute cor pulmonale: Secondary | ICD-10-CM

## 2010-10-08 ENCOUNTER — Ambulatory Visit (INDEPENDENT_AMBULATORY_CARE_PROVIDER_SITE_OTHER): Payer: Medicare HMO | Admitting: Family Medicine

## 2010-10-08 ENCOUNTER — Encounter: Payer: Self-pay | Admitting: Family Medicine

## 2010-10-08 DIAGNOSIS — E785 Hyperlipidemia, unspecified: Secondary | ICD-10-CM

## 2010-10-08 DIAGNOSIS — D509 Iron deficiency anemia, unspecified: Secondary | ICD-10-CM

## 2010-10-08 DIAGNOSIS — E119 Type 2 diabetes mellitus without complications: Secondary | ICD-10-CM

## 2010-10-08 DIAGNOSIS — I2699 Other pulmonary embolism without acute cor pulmonale: Secondary | ICD-10-CM

## 2010-10-08 DIAGNOSIS — K219 Gastro-esophageal reflux disease without esophagitis: Secondary | ICD-10-CM

## 2010-10-08 DIAGNOSIS — I10 Essential (primary) hypertension: Secondary | ICD-10-CM

## 2010-10-08 MED ORDER — AMLODIPINE BESYLATE 10 MG PO TABS: 10.0000 mg | ORAL_TABLET | Freq: Every day | ORAL | Status: DC

## 2010-10-08 MED ORDER — PROPRANOLOL HCL 80 MG PO TABS: 80.0000 mg | ORAL_TABLET | Freq: Every day | ORAL | Status: DC

## 2010-10-08 MED ORDER — LOSARTAN POTASSIUM-HCTZ 50-12.5 MG PO TABS: 1.0000 | ORAL_TABLET | Freq: Every day | ORAL | Status: DC

## 2010-10-08 MED ORDER — OMEPRAZOLE 40 MG PO CPDR: 40.0000 mg | DELAYED_RELEASE_CAPSULE | Freq: Every day | ORAL | Status: DC

## 2010-10-08 MED ORDER — FUROSEMIDE 40 MG PO TABS: ORAL_TABLET | ORAL | Status: DC

## 2010-10-08 NOTE — Patient Instructions (Addendum)
CPE   In October.  Fasting labs in October before the visit  LABWORK  NEEDS TO BE DONE BETWEEN 3 TO 7 DAYS BEFORE YOUR NEXT SCEDULED  VISIT.  THIS WILL IMPROVE THE QUALITY OF YOUR CARE.  We will sched your mammogram   Take slow iron 65mg  tablet  One daily instead of your current iron tablet , that should be much easier to take with less constippation  Handicap sticker.   and walking cane

## 2010-10-11 NOTE — Assessment & Plan Note (Signed)
Pt to change iron prep  Based on constipation, or to attempt alt day treatment with current med

## 2010-10-11 NOTE — Assessment & Plan Note (Signed)
Controlled, no change in medication  

## 2010-10-11 NOTE — Assessment & Plan Note (Addendum)
Uncontrolled, pt needs to start medication , however due to potential interaction with coumadin , will hold on this at this time

## 2010-10-11 NOTE — Progress Notes (Signed)
  Subjective:    Patient ID: [name], female    DOB: [birth year], 70 y.o.   MRN: [id number]  HPI The PT is here for follow up and re-evaluation of chronic medical conditions, medication management and review of any available recent lab and radiology data.  Preventive health is updated, specifically  Cancer screening and Immunization.   Questions or concerns regarding consultations or procedures which the PT has had in the interim are  addressed. The PT denies any adverse reactions to current medications since the last visit.  States her knee continues to hurt and is unstable, anxious to have surgery, but is aware of the need to hold off until PE treated Requests a cane of her own, uses husband's also a handicap sticker Denies polyuria, polydipsia or blurred vision     Review of Systems See HPI Denies recent fever or chills. Denies sinus pressure, nasal congestion, ear pain or sore throat. Denies chest congestion, productive cough or wheezing. Denies chest pains, palpitations and leg swelling Denies abdominal pain, nausea, vomiting,diarrhea, c/o constipation. With iron  Denies dysuria, frequency, hesitancy or incontinence. Denies headaches, seizures, numbness, or tingling. Denies depression, anxiety or insomnia. Denies skin break down or rash.        Objective:   Physical Exam Patient alert and oriented and in no cardiopulmonary distress.  HEENT: No facial asymmetry, EOMI, no sinus tenderness,  oropharynx pink and moist.  Neck supple no adenopathy.  Chest: Clear to auscultation bilaterally.  CVS: S1, S2 no murmurs, no S3.  ABD: Soft non tender. Bowel sounds normal.  Ext: No edema  MS: Adequate ROM spine, shoulders, hips and markedly reduced in  knees.  Skin: Intact, no ulcerations or rash noted.  Psych: Good eye contact, normal affect. Memory intact mildly anxious or depressed appearing.  CNS: CN 2-12 intact, power, tone and sensation normal throughout.        Assessment & Plan:

## 2010-10-11 NOTE — Assessment & Plan Note (Signed)
Continue coumadin, dosed through the clinic

## 2010-10-20 ENCOUNTER — Other Ambulatory Visit: Payer: Self-pay | Admitting: Cardiology

## 2010-10-21 ENCOUNTER — Telehealth: Payer: Self-pay | Admitting: Family Medicine

## 2010-10-21 ENCOUNTER — Encounter (INDEPENDENT_AMBULATORY_CARE_PROVIDER_SITE_OTHER): Payer: Medicare HMO

## 2010-10-21 ENCOUNTER — Encounter: Payer: Self-pay | Admitting: *Deleted

## 2010-10-21 ENCOUNTER — Ambulatory Visit (INDEPENDENT_AMBULATORY_CARE_PROVIDER_SITE_OTHER): Payer: Self-pay | Admitting: *Deleted

## 2010-10-21 ENCOUNTER — Other Ambulatory Visit: Payer: Self-pay | Admitting: Family Medicine

## 2010-10-21 ENCOUNTER — Telehealth: Payer: Self-pay

## 2010-10-21 DIAGNOSIS — K921 Melena: Secondary | ICD-10-CM

## 2010-10-21 DIAGNOSIS — IMO0001 Reserved for inherently not codable concepts without codable children: Secondary | ICD-10-CM

## 2010-10-21 DIAGNOSIS — R0989 Other specified symptoms and signs involving the circulatory and respiratory systems: Secondary | ICD-10-CM

## 2010-10-21 DIAGNOSIS — Z7901 Long term (current) use of anticoagulants: Secondary | ICD-10-CM

## 2010-10-21 DIAGNOSIS — I2699 Other pulmonary embolism without acute cor pulmonale: Secondary | ICD-10-CM

## 2010-10-21 LAB — POCT INR: INR: 2.9

## 2010-10-21 LAB — COMPREHENSIVE METABOLIC PANEL
BUN: 35 mg/dL — ABNORMAL HIGH (ref 6–23)
CO2: 26 mEq/L (ref 19–32)
Calcium: 9.4 mg/dL (ref 8.4–10.5)
Chloride: 102 mEq/L (ref 96–112)
Creat: 1.15 mg/dL — ABNORMAL HIGH (ref 0.50–1.10)
Glucose, Bld: 134 mg/dL — ABNORMAL HIGH (ref 70–99)
Total Bilirubin: 0.2 mg/dL — ABNORMAL LOW (ref 0.3–1.2)

## 2010-10-21 NOTE — Telephone Encounter (Signed)
Patient aware and labs ordered

## 2010-10-21 NOTE — Telephone Encounter (Signed)
pls call pt let her know I have a msg of blood in the stools,she is on a blood thinner and needs to stay on this. I will refer her to the stomach specialists , and along with the doc's treating her for the embolus in her lungs a decision can be taken as to when she can get further evaluation of this, soo I am sending in a referral to GI pls let her know and ensure she understands

## 2010-10-21 NOTE — Telephone Encounter (Signed)
pls also order CBC and anemia panel asap, not stat, explain this is because of GI blood loss

## 2010-10-23 LAB — CBC WITH DIFFERENTIAL/PLATELET
Basophils Absolute: 0 10*3/uL (ref 0.0–0.1)
Basophils Relative: 0 % (ref 0–1)
HCT: 30.8 % — ABNORMAL LOW (ref 36.0–46.0)
Hemoglobin: 9.3 g/dL — ABNORMAL LOW (ref 12.0–15.0)
Lymphocytes Relative: 32 % (ref 12–46)
Monocytes Absolute: 0.6 10*3/uL (ref 0.1–1.0)
Monocytes Relative: 8 % (ref 3–12)
Neutro Abs: 4.5 10*3/uL (ref 1.7–7.7)
Neutrophils Relative %: 59 % (ref 43–77)
WBC: 7.6 10*3/uL (ref 4.0–10.5)

## 2010-10-28 ENCOUNTER — Encounter: Payer: Self-pay | Admitting: Gastroenterology

## 2010-10-28 ENCOUNTER — Ambulatory Visit (INDEPENDENT_AMBULATORY_CARE_PROVIDER_SITE_OTHER): Payer: Medicare HMO | Admitting: Gastroenterology

## 2010-10-28 VITALS — BP 105/72 | HR 72 | Temp 98.5°F | Ht 64.0 in | Wt 216.0 lb

## 2010-10-28 DIAGNOSIS — D649 Anemia, unspecified: Secondary | ICD-10-CM

## 2010-10-28 DIAGNOSIS — K219 Gastro-esophageal reflux disease without esophagitis: Secondary | ICD-10-CM

## 2010-10-28 NOTE — Progress Notes (Signed)
Primary Care Physician:  Syliva Overman, MD, MD Primary Gastroenterologist:  Dr. Darrick Penna   Chief Complaint  Patient presents with  . Rectal Bleeding    HPI:   [name] is a pleasant 70 year old female with quite an extensive history to include hospitalization in late May/June 2012 secondary to acute bilateral pulmonary emboli. She is now on Coumadin. She was diagnosed with IDA while inpatient at [facility]. Labs outlined below. Last anemia panel was in June; she was supposed to have this repeated but it is not on file. Heme + stools noted. Last colonoscopy by Dr. Elpidio Anis in 2002 with external hemorrhoids, otherwise normal colon. She has quit taking iron due to constipation. She is taking fiber supplements to assist with her bowel regimen. Reports stools are "reddish" looking occasionally. Vague lower abdominal cramping related to constipation, but pt reports "not that bad". Denies melena.  Reports intermittent GERD despite Omeprazole 40 mg daily. Avoids foods that exacerbate but still has breakthrough reflux. Denies routine use of NSAIDs, only had 1 BC powder last week for headache. No loss of appetite or wt loss. Vague intermittent epigastric soreness; reports dysphagia with steak.  Still feels weak, has DOE.    June Hgb 10.5, Iron 16, Ferritin 22 Sept 9.3/30.8   Past Medical History  Diagnosis Date  . Atrial fibrillation   . Hyperlipidemia   . Hypertension   . Overweight   . Depression   . Diabetes mellitus   . Migraine   . Degenerative joint disease   . Gastroesophageal reflux disease   . Glaucoma   . Pulmonary embolism May 2012    acute presentation, bilateral PE  . Chronic anticoagulation 07/21/2010  . S/P colonoscopy Jan 2002    Dr. Elpidio Anis: external hemorrhoids, normal colon     Past Surgical History  Procedure Date  . Tonsillectomy   . Dilation and curettage of uterus   . Urethral dilation   . Left knee arthroscopy 1970s    Current Outpatient Prescriptions    Medication Sig Dispense Refill  . amLODipine (NORVASC) 10 MG tablet Take 1 tablet (10 mg total) by mouth daily.  90 tablet  1  . Blood Glucose Monitoring Suppl (ACCU-CHEK AVIVA PLUS) W/DEVICE KIT 1 kit by Does not apply route daily. Once daily testing 250.00  1 kit  0  . Fiber POWD Take by mouth.        . furosemide (LASIX) 40 MG tablet Take one half to one tablet by mouth once daily as needed  90 tablet  1  . glucose blood (ACCU-CHEK AVIVA) test strip Once daily testing Dx:250.00  100 each  2  . HYDROcodone-acetaminophen (NORCO) 5-325 MG per tablet One tablet  daily as needed for severe pain  30 tablet  5  . imipramine (TOFRANIL) 50 MG tablet Take 1 tablet (50 mg total) by mouth at bedtime. Take 2 tablets by mouth   180 tablet  1  . Lancets (ACCU-CHEK MULTICLIX) lancets Once daily testing Dx: 250.00  100 each  2  . latanoprost (XALATAN) 0.005 % ophthalmic solution       . losartan-hydrochlorothiazide (HYZAAR) 50-12.5 MG per tablet Take 1 tablet by mouth daily.  90 tablet  1  . meclizine (ANTIVERT) 25 MG tablet TAKE 1 TABLET BY MOUTH THREE TIMES A DAY AS NEEDED FOR DIZZINESS  30 tablet  1  . metFORMIN (GLUCOPHAGE) 500 MG tablet Take 1 tablet (500 mg total) by mouth 2 (two) times daily with a meal.  180 tablet  0  . omeprazole (PRILOSEC) 40 MG capsule Take 1 capsule (40 mg total) by mouth daily.  90 capsule  1  . potassium gluconate 595 MG TABS Take 595 mg by mouth daily.        . propranolol (INDERAL) 80 MG tablet Take 1 tablet (80 mg total) by mouth daily.  90 tablet  1  . temazepam (RESTORIL) 30 MG capsule Take 30 mg by mouth at bedtime as needed.       . topiramate (TOPAMAX) 100 MG tablet Take 1 tablet (100 mg total) by mouth 2 (two) times daily.  180 tablet  1  . warfarin (COUMADIN) 7.5 MG tablet Take 7.5 mg by mouth as directed. Monday, Wednesday,Firday take 7.5 mg then on  Sunday, Tuesday, Thursday, Saturday take 3.75 mg       . ferrous sulfate 325 (65 FE) MG tablet Take 1 tablet (325  mg total) by mouth 2 (two) times daily.  180 tablet  1    Allergies as of 10/28/2010 - Review Complete 10/28/2010  Allergen Reaction Noted  . Codeine    . Sulfonamide derivatives  02/03/2007    Family History  Problem Relation Age of Onset  . Diabetes Mother   . Hypertension Mother   . Arthritis Mother   . Heart failure Mother   . Leukemia Father   . Diabetes Sister   . Hypertension Sister   . Diabetes Brother   . Hypertension Brother   . Hypertension Brother   . Hypertension Brother   . Hypertension Brother   . Diabetes Brother   . Diabetes Brother   . Diabetes Brother   . Pulmonary embolism Sister   . Colon cancer Neg Hx     History   Social History  . Marital Status: Married    Spouse Name: N/A    Number of Children: 2  . Years of Education: N/A   Occupational History  . 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     Social History Main Topics  . Smoking status: Never Smoker   . Smokeless tobacco: Never Used  . Alcohol Use: No  . Drug Use: No  . Sexually Active: Not on file    Review of Systems: Gen: Denies any fever, chills, weight loss, lack of appetite. +fatigue, weak CV: Denies chest pain, heart palpitations, peripheral edema, syncope.  Resp: Denies shortness of breath at rest. +DOE. Denies wheezing or cough.  GI: Denies odynophagia. +dysphagia with steak. Denies jaundice, hematemesis, fecal incontinence. GU : Denies urinary burning, urinary frequency, urinary hesitancy MS: Denies joint pain, muscle weakness, cramps, or limitation of movement.  Derm: Denies rash, itching, dry skin Psych: Denies depression, anxiety, memory loss, and confusion Heme: Denies bruising, bleeding, and enlarged lymph nodes.  Physical Exam: BP 105/72  Pulse 72  Temp(Src) 98.5 F (36.9 C) (Temporal)  Ht 5\' 4"  (1.626 m)  Wt 216 lb (97.977 kg)  BMI 37.08 kg/m2 General:   Alert and oriented. Pleasant and cooperative. Well-nourished and well-developed.  Head:  Normocephalic  and atraumatic. Eyes:  Without icterus, sclera clear and conjunctiva pink.  Ears:  Normal auditory acuity. Nose:  No deformity, discharge,  or lesions. Mouth:  No deformity or lesions, oral mucosa pink.  Neck:  Supple, without mass or thyromegaly. Lungs:  Clear to auscultation bilaterally. Diminished in bases. No wheezes, rales, or rhonchi. No distress.  Heart:  S1, S2 present without murmurs appreciated.  Abdomen:  +BS, soft, non-tender and non-distended. No  HSM noted. No guarding or rebound. No masses appreciated.  Rectal:  Deferred  Msk:  Symmetrical without gross deformities. Normal posture. Extremities:  Without clubbing or edema. Neurologic:  Alert and  oriented x4;  grossly normal neurologically. Skin:  Intact without significant lesions or rashes. Cervical Nodes:  No significant cervical adenopathy. Psych:  Alert and cooperative. Normal mood and affect.

## 2010-10-28 NOTE — Patient Instructions (Signed)
Stop Prilosec. Begin Nexium 40 mg daily, 30 minutes before breakfast.  See reflux handout.  We will be talking with Dr. Darrick Penna and your heart doctor for how to proceed with the timing of your evaluation. We will be in contact very soon.

## 2010-11-01 ENCOUNTER — Encounter: Payer: Self-pay | Admitting: Gastroenterology

## 2010-11-01 NOTE — Assessment & Plan Note (Signed)
70 year old female with noted anemia in June, Hgb 10.5, dropped to 9.3 in Sept. Low iron, ferritin low normal. On Coumadin since June secondary to acute bilateral pulmonary emboli, followed by Dr. Dietrich Pates for anticoagulation. Heme + stools recently. Last colonoscopy in 2002 with Dr. Katrinka Blazing. Denies melena, states has seen "reddish" stool after taking fiber. Complains of fatigue, weakness, which is likely secondary to known hx of PE, likely anemia contributing as well. Pt needs evaluation of lower and upper GI tract in very near future, as well as updated anemia panel. We will need to discuss lovenox bridging, as it is likely not an option to stop Coumadin due to recent PE in late May/June. Informed pt we would discuss the plan with Dr. Darrick Penna and cardiologist, then proceed with evaluation in very near future.  Proceed with colonoscopy and EGD with Dr. Darrick Penna in the near future. The risks, benefits, and alternatives have been discussed in detail with the patient. They state understanding and desire to proceed.  Will determine Lovenox bridging and contact pt back.

## 2010-11-01 NOTE — Assessment & Plan Note (Signed)
Breakthrough reflux despite Omeprazole 40 mg daily. Diet/obesity likely playing significant role. Will switch to Nexium, provide GERD diet. Vague dysphagia with steak. Proceeding with EGD due to likely IDA.

## 2010-11-02 ENCOUNTER — Telehealth: Payer: Self-pay | Admitting: Gastroenterology

## 2010-11-02 NOTE — Progress Notes (Signed)
Cc to PCP 

## 2010-11-02 NOTE — Telephone Encounter (Signed)
Pt aware. Order faxed to [facility].

## 2010-11-02 NOTE — Telephone Encounter (Signed)
Needs updated anemia panel. Was ordered but for some reason not completed by patient. Need her to have this done this week. Talking with Dr. Darrick Penna about timing of procedures. We will get back to her about that.

## 2010-11-04 ENCOUNTER — Other Ambulatory Visit: Payer: Self-pay

## 2010-11-04 ENCOUNTER — Other Ambulatory Visit: Payer: Self-pay | Admitting: Gastroenterology

## 2010-11-04 DIAGNOSIS — D649 Anemia, unspecified: Secondary | ICD-10-CM

## 2010-11-04 NOTE — Progress Notes (Signed)
Please see Dr. Darrick Penna' recommendations. We need to ask cardiology if we may stop Coumadin X 4 days, proceed with Lovenox bridge. She will need TCS/EGD/DIL.

## 2010-11-04 NOTE — Progress Notes (Signed)
Pt with recent DVT/PE MAY 30. It has been over 3 mos since she started anticoagulation. The clot should be organized. Speak with Cardiology RE: any concerns with stopping Coumadin and using the Lovenox bridge. If they approve, schedule pt for TCS/EGD/DIL. Pt may have early IDA or ACD-MAY 2012-FERRITIN LOW NL 22 AND TIBC NL 307, CR 1.68-0.7

## 2010-11-05 ENCOUNTER — Telehealth: Payer: Self-pay | Admitting: Cardiology

## 2010-11-05 LAB — FERRITIN: Ferritin: 6 ng/mL — ABNORMAL LOW (ref 10–291)

## 2010-11-05 LAB — IRON AND TIBC: UIBC: 355 ug/dL (ref 125–400)

## 2010-11-05 LAB — FOLATE: Folate: 14.6 ng/mL

## 2010-11-05 NOTE — Progress Notes (Signed)
Called and spoke with Archie Patten, at [facility]. She will give message to the nurse to find out if Dr. Dietrich Pates will consent to pt stopping coumadin for 4 days and do Lovenox bridge to do a colonoscopy and EGD with dilation.

## 2010-11-05 NOTE — Telephone Encounter (Signed)
DR WANTS TO KNOW IF IT IS OKAY TO START LOVANOX TO HAVE A COLONOSCOPY AND ECG WITH DIALATION.

## 2010-11-06 NOTE — Telephone Encounter (Signed)
Attempted to reach MD office.  They close at 12pm on Friday.  Will send to Dr. Dietrich Pates to clear pt for procedure and speak with Dr. Evelina Dun office on Monday.

## 2010-11-07 NOTE — Telephone Encounter (Signed)
Patient has recently suffered a pulmonary embolism, and risk of interrupting anticoagulation is substantial, whereas the gain is speculative since it is uncertain whether biopsy or polypectomy will be required during colonoscopy.  The procedure should be performed without interruption of warfarin therapy.  With polypectomy or biopsy is required, this should be performed in a separate procedure with enoxaparin or heparin coverage.

## 2010-11-09 ENCOUNTER — Telehealth: Payer: Self-pay | Admitting: Gastroenterology

## 2010-11-09 NOTE — Progress Notes (Signed)
T/C from [facility]. She said that Dr. Dietrich Pates prefers for pt to have procedure done on coumadin now, and if she has polyps, have a separate procedure and do the Lovenox bridge. He felt this would be in the pt's best interest since her PE was so recent.

## 2010-11-09 NOTE — Telephone Encounter (Signed)
[name] needs an appointment with Dr. Darrick Penna (E:30) to discuss endoscopy and anticoagulation. Please inform patient she will be seeing her BEFORE we schedule these procedures. Please have appt scheduled. Thanks!

## 2010-11-09 NOTE — Telephone Encounter (Signed)
Pt aware she will need OV.

## 2010-11-09 NOTE — Telephone Encounter (Signed)
Spoke with Tyler Aas at Dr. Evelina Dun office.  Informed of Dr. Marvel Plan suggestion.  She will inform Dr. Jettie Booze.

## 2010-11-09 NOTE — Progress Notes (Signed)
Dr. Darrick Penna will be seeing her in the office prior to any procedures to discuss Coumadin.

## 2010-11-09 NOTE — Progress Notes (Signed)
E:30 visit to discuss anticoagulation.

## 2010-11-10 NOTE — Telephone Encounter (Signed)
I called pt to offer OV for 11/1 at 0800 with SF, but patient said that her heart doctor reccommended she wait until after the first of the year.

## 2010-11-18 ENCOUNTER — Other Ambulatory Visit: Payer: Self-pay | Admitting: Family Medicine

## 2010-11-18 ENCOUNTER — Ambulatory Visit (INDEPENDENT_AMBULATORY_CARE_PROVIDER_SITE_OTHER): Payer: Medicare HMO | Admitting: *Deleted

## 2010-11-18 DIAGNOSIS — Z7901 Long term (current) use of anticoagulants: Secondary | ICD-10-CM

## 2010-11-18 DIAGNOSIS — I2699 Other pulmonary embolism without acute cor pulmonale: Secondary | ICD-10-CM

## 2010-11-18 LAB — POCT INR: INR: 1.6

## 2010-11-18 NOTE — Telephone Encounter (Signed)
Noted. Pt with IDA.

## 2010-12-01 LAB — HEMOGLOBIN A1C
Hgb A1c MFr Bld: 6.2 % — ABNORMAL HIGH (ref <5.7)
Mean Plasma Glucose: 131 mg/dL — ABNORMAL HIGH (ref <117)

## 2010-12-01 LAB — LIPID PANEL
Cholesterol: 201 mg/dL — ABNORMAL HIGH (ref 0–200)
VLDL: 32 mg/dL (ref 0–40)

## 2010-12-01 LAB — COMPLETE METABOLIC PANEL WITH GFR
AST: 13 U/L (ref 0–37)
Albumin: 4 g/dL (ref 3.5–5.2)
BUN: 22 mg/dL (ref 6–23)
Calcium: 9.1 mg/dL (ref 8.4–10.5)
Chloride: 107 mEq/L (ref 96–112)
GFR, Est Non African American: 58 mL/min — ABNORMAL LOW (ref >60)
Glucose, Bld: 100 mg/dL — ABNORMAL HIGH (ref 70–99)
Potassium: 4.2 mEq/L (ref 3.5–5.3)
Sodium: 143 mEq/L (ref 135–145)
Total Protein: 7 g/dL (ref 6.0–8.3)

## 2010-12-01 LAB — CBC WITH DIFFERENTIAL/PLATELET
Basophils Absolute: 0 10*3/uL (ref 0.0–0.1)
Basophils Relative: 0 % (ref 0–1)
Eosinophils Absolute: 0 10*3/uL (ref 0.0–0.7)
Eosinophils Relative: 0 % (ref 0–5)
Lymphs Abs: 2.3 10*3/uL (ref 0.7–4.0)
MCH: 23.1 pg — ABNORMAL LOW (ref 26.0–34.0)
MCHC: 30.3 g/dL (ref 30.0–36.0)
MCV: 76.3 fL — ABNORMAL LOW (ref 78.0–100.0)
Neutrophils Relative %: 58 % (ref 43–77)
Platelets: 472 10*3/uL — ABNORMAL HIGH (ref 150–400)
RBC: 3.63 MIL/uL — ABNORMAL LOW (ref 3.87–5.11)
RDW: 17.6 % — ABNORMAL HIGH (ref 11.5–15.5)

## 2010-12-01 LAB — VITAMIN B12: Vitamin B-12: 303 pg/mL (ref 211–911)

## 2010-12-01 LAB — MICROALBUMIN / CREATININE URINE RATIO: Microalb, Ur: 6.45 mg/dL — ABNORMAL HIGH (ref 0.00–1.89)

## 2010-12-01 NOTE — Progress Notes (Signed)
hgb a1c was ordered and is pending

## 2010-12-03 ENCOUNTER — Encounter: Payer: Self-pay | Admitting: Family Medicine

## 2010-12-08 ENCOUNTER — Encounter: Payer: Self-pay | Admitting: Family Medicine

## 2010-12-08 ENCOUNTER — Other Ambulatory Visit (HOSPITAL_COMMUNITY)
Admission: RE | Admit: 2010-12-08 | Discharge: 2010-12-08 | Disposition: A | Discharge status: Home / Self Care | Payer: Medicare HMO | Source: Ambulatory Visit | Attending: Family Medicine | Admitting: Family Medicine

## 2010-12-08 ENCOUNTER — Ambulatory Visit (INDEPENDENT_AMBULATORY_CARE_PROVIDER_SITE_OTHER): Payer: Medicare HMO | Admitting: Family Medicine

## 2010-12-08 VITALS — BP 112/78 | HR 63 | Resp 16 | Ht 65.0 in | Wt 214.0 lb

## 2010-12-08 DIAGNOSIS — K219 Gastro-esophageal reflux disease without esophagitis: Secondary | ICD-10-CM

## 2010-12-08 DIAGNOSIS — Z0000 Encounter for general adult medical examination without abnormal findings: Secondary | ICD-10-CM

## 2010-12-08 DIAGNOSIS — R42 Dizziness and giddiness: Secondary | ICD-10-CM

## 2010-12-08 DIAGNOSIS — Z124 Encounter for screening for malignant neoplasm of cervix: Secondary | ICD-10-CM

## 2010-12-08 DIAGNOSIS — I10 Essential (primary) hypertension: Secondary | ICD-10-CM

## 2010-12-08 DIAGNOSIS — R079 Chest pain, unspecified: Secondary | ICD-10-CM | POA: Insufficient documentation

## 2010-12-08 DIAGNOSIS — E785 Hyperlipidemia, unspecified: Secondary | ICD-10-CM

## 2010-12-08 DIAGNOSIS — D649 Anemia, unspecified: Secondary | ICD-10-CM

## 2010-12-08 DIAGNOSIS — Z1211 Encounter for screening for malignant neoplasm of colon: Secondary | ICD-10-CM

## 2010-12-08 DIAGNOSIS — E119 Type 2 diabetes mellitus without complications: Secondary | ICD-10-CM

## 2010-12-08 LAB — HEMOCCULT GUIAC POC 1CARD (OFFICE)

## 2010-12-08 MED ORDER — PROPRANOLOL HCL 80 MG PO TABS: 80.0000 mg | ORAL_TABLET | Freq: Every day | ORAL | Status: DC

## 2010-12-08 MED ORDER — AMLODIPINE BESYLATE 10 MG PO TABS: 10.0000 mg | ORAL_TABLET | Freq: Every day | ORAL | Status: DC

## 2010-12-08 MED ORDER — LOSARTAN POTASSIUM-HCTZ 50-12.5 MG PO TABS: 1.0000 | ORAL_TABLET | Freq: Every day | ORAL | Status: DC

## 2010-12-08 MED ORDER — PRAVASTATIN SODIUM 20 MG PO TABS: 20.0000 mg | ORAL_TABLET | Freq: Every evening | ORAL | Status: DC

## 2010-12-08 MED ORDER — OMEPRAZOLE 40 MG PO CPDR: 40.0000 mg | DELAYED_RELEASE_CAPSULE | Freq: Every day | ORAL | Status: DC

## 2010-12-08 MED ORDER — IMIPRAMINE HCL 50 MG PO TABS: 50.0000 mg | ORAL_TABLET | Freq: Every day | ORAL | Status: DC

## 2010-12-08 MED ORDER — MECLIZINE HCL 25 MG PO TABS: 25.0000 mg | ORAL_TABLET | Freq: Three times a day (TID) | ORAL | Status: AC | PRN

## 2010-12-08 MED ORDER — FUROSEMIDE 40 MG PO TABS: ORAL_TABLET | ORAL | Status: DC

## 2010-12-08 MED ORDER — MECLIZINE HCL 25 MG PO TABS: 25.0000 mg | ORAL_TABLET | Freq: Three times a day (TID) | ORAL | Status: DC | PRN

## 2010-12-08 MED ORDER — TEMAZEPAM 30 MG PO CAPS: 30.0000 mg | ORAL_CAPSULE | Freq: Every evening | ORAL | Status: DC | PRN

## 2010-12-08 NOTE — Patient Instructions (Addendum)
F/u in 3 months EKG today  You will be referred to Dr Haze Rushing will be referred to pain specialist for back pain.  New med to be started for cholesterol.  Med sent in for inner ear Fasting lipid, HBA1C , chem 7hepatic in 3 months

## 2010-12-09 ENCOUNTER — Ambulatory Visit (INDEPENDENT_AMBULATORY_CARE_PROVIDER_SITE_OTHER): Payer: Medicare HMO | Admitting: *Deleted

## 2010-12-09 DIAGNOSIS — Z7901 Long term (current) use of anticoagulants: Secondary | ICD-10-CM

## 2010-12-09 DIAGNOSIS — I2699 Other pulmonary embolism without acute cor pulmonale: Secondary | ICD-10-CM

## 2010-12-11 ENCOUNTER — Encounter: Payer: Self-pay | Admitting: *Deleted

## 2010-12-15 ENCOUNTER — Encounter: Payer: Self-pay | Admitting: Family Medicine

## 2010-12-16 ENCOUNTER — Ambulatory Visit: Payer: Medicare HMO | Admitting: Urgent Care

## 2010-12-17 ENCOUNTER — Encounter: Payer: Self-pay | Admitting: Gastroenterology

## 2010-12-17 ENCOUNTER — Ambulatory Visit (INDEPENDENT_AMBULATORY_CARE_PROVIDER_SITE_OTHER): Payer: Medicare HMO | Admitting: Gastroenterology

## 2010-12-17 VITALS — BP 130/84 | HR 80 | Temp 97.6°F | Ht 65.0 in | Wt 218.8 lb

## 2010-12-17 DIAGNOSIS — D509 Iron deficiency anemia, unspecified: Secondary | ICD-10-CM

## 2010-12-17 DIAGNOSIS — D649 Anemia, unspecified: Secondary | ICD-10-CM

## 2010-12-17 MED ORDER — ENOXAPARIN SODIUM 150 MG/ML ~~LOC~~ SOLN: 1.5000 mg/kg | Freq: Every day | SUBCUTANEOUS | Status: DC

## 2010-12-17 NOTE — Progress Notes (Signed)
Subjective:    Patient ID: [name], female    DOB: 05/25/1940, 70 y.o.   MRN: [id number]  PCP: [facility] CARDS: ROTHBART  HPI JUN 2012 had clots in her legs and lungs. Been on Coumadin sine. No blood in her stool or black tarry stools. No problems with nausea or vomiting usually. Had nausea and vomiting and diarrhea for 24 hours. BMs: regular-1x/d. Solid can be difficult to go down. Had esophagus stretched and TCS > 10 years ago.  No prior Hx low blood. Rare RUQ pain. No weight loss or loss of appetite.   No surgeries on chest or abdomen. No problems with sedation.  Past Medical History  Diagnosis Date  . Atrial fibrillation   . Hyperlipidemia   . Hypertension   . Overweight   . Depression   . Diabetes mellitus   . Migraine   . Degenerative joint disease   . Gastroesophageal reflux disease   . Glaucoma   . Pulmonary embolism May 2012    acute presentation, bilateral PE  . Chronic anticoagulation 07/21/2010  . S/P colonoscopy Jan 2002    Dr. Leroy Smith: external hemorrhoids, normal colon     Past Surgical History  Procedure Date  . Tonsillectomy   . Dilation and curettage of uterus   . Urethral dilation   . Left knee arthroscopy 1970s    Allergies  Allergen Reactions  . Codeine   . Sulfonamide Derivatives     Current Outpatient Prescriptions  Medication Sig Dispense Refill  . amLODipine (NORVASC) 10 MG tablet Take 1 tablet (10 mg total) by mouth daily.  90 tablet  1  . Blood Glucose Monitoring Suppl (ACCU-CHEK AVIVA PLUS) W/DEVICE KIT 1 kit by Does not apply route daily. Once daily testing 250.00  1 kit  0  . ferrous sulfate 325 (65 FE) MG tablet Take 1 tablet (325 mg total) by mouth 2 (two) times daily.  180 tablet  1  . Fiber POWD Take by mouth.        . furosemide (LASIX) 40 MG tablet Take one half to one tablet by mouth once daily as needed  90 tablet  1  . glucose blood (ACCU-CHEK AVIVA) test strip Once daily testing Dx:250.00  100 each  2  .  HYDROcodone-acetaminophen (NORCO) 5-325 MG per tablet One tablet  daily as needed for severe pain  30 tablet  5  . imipramine (TOFRANIL) 50 MG tablet Take 1 tablet (50 mg total) by mouth at bedtime. Take 2 tablets by mouth   180 tablet  1  . Lancets (ACCU-CHEK MULTICLIX) lancets Once daily testing Dx: 250.00  100 each  2  . latanoprost (XALATAN) 0.005 % ophthalmic solution 1 drop daily.       . losartan-hydrochlorothiazide (HYZAAR) 50-12.5 MG per tablet Take 1 tablet by mouth daily.  90 tablet  1  . meclizine (ANTIVERT) 25 MG tablet Take 1 tablet (25 mg total) by mouth 3 (three) times daily as needed.  30 tablet  0  . metFORMIN (GLUCOPHAGE) 500 MG tablet Take 1 tablet (500 mg total) by mouth 2 (two) times daily with a meal.  180 tablet  0  . omeprazole (PRILOSEC) 40 MG capsule Take 1 capsule (40 mg total) by mouth daily.  90 capsule  1  . potassium gluconate 595 MG TABS Take 595 mg by mouth daily.        . pravastatin (PRAVACHOL) 20 MG tablet Take 1 tablet (20 mg total) by mouth every   evening.  90 tablet  3  . propranolol (INDERAL) 80 MG tablet Take 1 tablet (80 mg total) by mouth daily.  90 tablet  1  . temazepam (RESTORIL) 30 MG capsule Take 1 capsule (30 mg total) by mouth at bedtime as needed.  90 capsule  1  . topiramate (TOPAMAX) 100 MG tablet Take 1 tablet (100 mg total) by mouth 2 (two) times daily.  180 tablet  1  . warfarin (COUMADIN) 7.5 MG tablet Take 7.5 mg by mouth as directed. Monday, Wednesday,Firday take 7.5 mg then on  Sunday, Tuesday, Thursday, Saturday take 3.75 mg       . enoxaparin (LOVENOX) 150 MG/ML injection Inject 0.99 mLs (150 mg total) into the skin daily.  3 mL  1      Review of Systems     Objective:   Physical Exam  Vitals reviewed. Constitutional: She is oriented to person, place, and time. She appears well-developed and well-nourished.  HENT:  Head: Normocephalic and atraumatic.  Mouth/Throat: Oropharynx is clear and moist. No oropharyngeal exudate.    Eyes: Pupils are equal, round, and reactive to light. No scleral icterus.  Neck: Normal range of motion. Neck supple.  Cardiovascular: Normal rate, regular rhythm and normal heart sounds.   Pulmonary/Chest: Effort normal and breath sounds normal.  Abdominal: Soft. Bowel sounds are normal. She exhibits no distension. There is no tenderness.  Musculoskeletal: She exhibits no edema.  Lymphadenopathy:    She has no cervical adenopathy.  Neurological: She is alert and oriented to person, place, and time.       NO FOCAL DEFICITS   Psychiatric: She has a normal mood and affect.          Assessment & Plan:   

## 2010-12-17 NOTE — Assessment & Plan Note (Addendum)
FeDA-differential diagnosis include colon polyps, less likey colon ca or AVMs.  OPV in 6 mos. TCS/EGD/?ED-MOVIPREP  HOLD IRON FOR 7 DAYS. Continue metformin. Stop coumadin 5 days before procedure. Start Lovenox 1.5 mg/kg qd- 3 days prior to procedure. HOLD LOVENOX ON DAY OF PROCEDURE. Hematology referral to assess need for chronic Coumadin.

## 2010-12-17 NOTE — Assessment & Plan Note (Signed)
MOST LIKELY 2o TO COLON POLYPS/GASTRITIS. Doubt colon ca or gastric ca.  FOLLOW UP in 6 mos. COLONOSCOPY AND UPPER ENDOSCOPY IN NOV 2012. HOLD IRON FOR 7 DAYS. Continue metformin. Stop coumadin 5 days before procedure. Start Lovenox 150 MG SQ QD- 3 days prior to procedure. HOLD LOVENOX ON DAY OF PROCEDURE. Hematology referral to assess need for chronic Coumadin.

## 2010-12-17 NOTE — Progress Notes (Signed)
Reminder in epic to follow up in 6 months with SF °

## 2010-12-17 NOTE — Progress Notes (Signed)
Cc to PCP 

## 2010-12-17 NOTE — Patient Instructions (Signed)
FOLLOW UP in 6 mos. COLONOSCOPY AND UPPER ENDOSCOPY IN NOV 2012. HOLD IRON FOR 7 DAYS. Continue metformin. Stop coumadin 5 days before procedure. Start Lovenox 150 MG SQ QD- 3 days prior to procedure. HOLD LOVENOX ON DAY OF PROCEDURE. Hematology referral to assess need for chronic Coumadin.

## 2010-12-25 ENCOUNTER — Telehealth: Payer: Self-pay | Admitting: Gastroenterology

## 2010-12-25 NOTE — Telephone Encounter (Signed)
Referral fxed to Dr Mariel Sleet for eval of chronic need for coumadin per [facility]

## 2010-12-29 ENCOUNTER — Telehealth: Payer: Self-pay

## 2010-12-29 NOTE — Telephone Encounter (Signed)
Pt came by office for a review on giving Lovenox injections. She wants her husband to give the injections. Reviewed the info with pt and husband. He demonstrated knowledge and understanding. Just need to confirm does pt do the Lovenox injections daily in the AM or PM. Please advise!

## 2010-12-30 ENCOUNTER — Ambulatory Visit (INDEPENDENT_AMBULATORY_CARE_PROVIDER_SITE_OTHER): Payer: Medicare HMO | Admitting: *Deleted

## 2010-12-30 DIAGNOSIS — I2699 Other pulmonary embolism without acute cor pulmonale: Secondary | ICD-10-CM

## 2010-12-30 DIAGNOSIS — Z7901 Long term (current) use of anticoagulants: Secondary | ICD-10-CM

## 2010-12-30 LAB — POCT INR: INR: 3.2

## 2010-12-30 NOTE — Progress Notes (Signed)
Pt was informed.

## 2010-12-30 NOTE — Patient Instructions (Signed)
Scheduled for colonoscopy on 11/20 by Dr Darrick Penna Was given lovenox bridging instructions by Dr Darrick Penna

## 2010-12-30 NOTE — Progress Notes (Signed)
Please see previous documentation on 12/30/2010. Per Tana Coast, PA, pt should take the Lovenox injections in the AM, by 10:00 AM. Make sure to take at the same time each morning. Pt was informed and expressed understanding.

## 2010-12-30 NOTE — Progress Notes (Signed)
[name], this pt is scheduled to start her Lovenox on 01/02/2011. Dr. Darrick Penna is out of town. Does pt take the Lovenox in the AM or pm.  She is aware that 12/30/2010 is the last day that she takes the coumadin. Dr. Darrick Penna told her to not take any coumadin on 12/31/2010 and 01/01/2011. To start Lovenox on the 17th, 18th, and 19th. She knows not to take it on the 20th, the day of her procedure. She has the individual syringes and her husband is going to give them. I just needed to know if it should be given AM or PM since it is just once a day.

## 2010-12-30 NOTE — Progress Notes (Signed)
  Patient should take it in AMs, same time each day (before 10am).

## 2010-12-31 ENCOUNTER — Encounter (HOSPITAL_COMMUNITY): Payer: Medicare HMO

## 2010-12-31 ENCOUNTER — Encounter (HOSPITAL_COMMUNITY): Payer: Self-pay | Admitting: Pharmacy Technician

## 2010-12-31 ENCOUNTER — Other Ambulatory Visit (HOSPITAL_COMMUNITY): Payer: Self-pay | Admitting: Oncology

## 2010-12-31 ENCOUNTER — Encounter (HOSPITAL_COMMUNITY): Payer: Medicare HMO | Attending: Oncology

## 2010-12-31 ENCOUNTER — Encounter (HOSPITAL_COMMUNITY): Payer: Self-pay

## 2010-12-31 VITALS — BP 155/90 | HR 77 | Temp 98.1°F | Ht 65.0 in | Wt 218.0 lb

## 2010-12-31 DIAGNOSIS — Z7901 Long term (current) use of anticoagulants: Secondary | ICD-10-CM

## 2010-12-31 NOTE — Progress Notes (Signed)
As noted

## 2010-12-31 NOTE — Progress Notes (Signed)
Referral MD      Reason for Referral:   No chief complaint on file. : History of pulmonary embolism currently on Coumadin, assess for length of anticoagulation and rule out hypercoagulable syndrome.  This patient developed a  DVT and, pulmonary embolism approximately this past June. She has a number of comorbid problems but essentially was not time when this developed. She was admitted to [facility] in  some duress and was effectively anticoagulated . She has been on high relatively stable dose of Coumadin for the past few months he is in need of a knee operation answered questions, past 2 the duration of anticoagulation. Addition she has been found to be anemic and is in need of endoscopy and colonoscopy. Scheduled and there has been medications made regarding bridging Lovenox.  HPI:   Past Medical History  Diagnosis Date  . Atrial fibrillation   . Hyperlipidemia   . Hypertension   . Overweight   . Depression   . Diabetes mellitus   . Migraine   . Degenerative joint disease   . Gastroesophageal reflux disease   . Glaucoma   . Pulmonary embolism May 2012    acute presentation, bilateral PE  . Chronic anticoagulation 07/21/2010  . S/P colonoscopy Jan 2002    Dr. Elpidio Anis: external hemorrhoids, normal colon   :  Past Surgical History  Procedure Date  . Tonsillectomy   . Dilation and curettage of uterus   . Urethral dilation   . Left knee arthroscopy 1970s  :  Current outpatient prescriptions:amLODipine (NORVASC) 10 MG tablet, Take 1 tablet (10 mg total) by mouth daily., Disp: 90 tablet, Rfl: 1;  Blood Glucose Monitoring Suppl (ACCU-CHEK AVIVA PLUS) W/DEVICE KIT, 1 kit by Does not apply route daily. Once daily testing 250.00, Disp: 1 kit, Rfl: 0;  enoxaparin (LOVENOX) 150 MG/ML injection, Inject 1 mg/kg into the skin daily.  , Disp: , Rfl:  ferrous sulfate 325 (65 FE) MG tablet, Take 325 mg by mouth daily.  , Disp: , Rfl: ;  Fiber POWD, Take by mouth. , Disp: , Rfl: ;   furosemide (LASIX) 40 MG tablet, Take one half to one tablet by mouth once daily as needed, Disp: 90 tablet, Rfl: 1;  glucose blood (ACCU-CHEK AVIVA) test strip, Once daily testing Dx:250.00, Disp: 100 each, Rfl: 2 HYDROcodone-acetaminophen (NORCO) 5-325 MG per tablet, One tablet  daily as needed for severe pain, Disp: 30 tablet, Rfl: 5;  imipramine (TOFRANIL) 50 MG tablet, Take 1 tablet (50 mg total) by mouth at bedtime. Take 2 tablets by mouth , Disp: 180 tablet, Rfl: 1;  Lancets (ACCU-CHEK MULTICLIX) lancets, Once daily testing Dx: 250.00, Disp: 100 each, Rfl: 2;  latanoprost (XALATAN) 0.005 % ophthalmic solution, 1 drop daily. , Disp: , Rfl:  losartan-hydrochlorothiazide (HYZAAR) 50-12.5 MG per tablet, Take 1 tablet by mouth daily., Disp: 90 tablet, Rfl: 1;  meclizine (ANTIVERT) 25 MG tablet, Take 1 tablet (25 mg total) by mouth 3 (three) times daily as needed., Disp: 30 tablet, Rfl: 0;  metFORMIN (GLUCOPHAGE) 500 MG tablet, Take 1 tablet (500 mg total) by mouth 2 (two) times daily with a meal., Disp: 180 tablet, Rfl: 0 omeprazole (PRILOSEC) 40 MG capsule, Take 1 capsule (40 mg total) by mouth daily., Disp: 90 capsule, Rfl: 1;  potassium gluconate 595 MG TABS, Take 595 mg by mouth daily. , Disp: , Rfl: ;  pravastatin (PRAVACHOL) 20 MG tablet, Take 1 tablet (20 mg total) by mouth every evening., Disp: 90 tablet,  Rfl: 3;  propranolol (INDERAL) 80 MG tablet, Take 1 tablet (80 mg total) by mouth daily., Disp: 90 tablet, Rfl: 1 temazepam (RESTORIL) 30 MG capsule, Take 1 capsule (30 mg total) by mouth at bedtime as needed., Disp: 90 capsule, Rfl: 1;  topiramate (TOPAMAX) 100 MG tablet, Take 1 tablet (100 mg total) by mouth 2 (two) times daily., Disp: 180 tablet, Rfl: 1;  warfarin (COUMADIN) 7.5 MG tablet, Take 7.5 mg by mouth as directed. Monday, Wednesday,Firday take 7.5 mg then on  Sunday, Tuesday, Thursday, Saturday take 3.75 mg , Disp: , Rfl: :    :  Allergies  Allergen Reactions  . Codeine   .  Sulfonamide Derivatives   :  Family History  Problem Relation Age of Onset  . Diabetes Mother   . Hypertension Mother   . Arthritis Mother   . Heart failure Mother   . Leukemia Father   . Diabetes Sister   . Hypertension Sister   . Diabetes Brother   . Hypertension Brother   . Hypertension Brother   . Hypertension Brother   . Hypertension Brother   . Diabetes Brother   . Diabetes Brother   . Diabetes Brother   . Pulmonary embolism Sister   . Colon cancer Neg Hx   . Colon polyps Neg Hx   :  History   Social History  . Marital Status: Married    Spouse Name: N/A    Number of Children: 2  . Years of Education: N/A   Occupational History  . 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     Social History Main Topics  . Smoking status: Never Smoker   . Smokeless tobacco: Never Used  . Alcohol Use: No  . Drug Use: No  . Sexually Active: Not on file   Other Topics Concern  . Not on file   Social History Narrative  . No narrative on file  :  A comprehensive review of systems was negative except for: Musculoskeletal: positive for rt knee pain  Exam:  General appearance: alert, cooperative and appears stated age Somewhat overweight woman  Head and neck exam unremarkable for adenopathy  lungs are clear to auscultation and percussion  Cardiovascular exam is normal, she seems to be normal sinus rhythm Administered to prevent no palpable hepatosplenomegaly   extremities no peripheral edema cyanosis or clubbing    No results found for this basename: WBC:2,HGB:2,HCT:2,PLT:2 in the last 72 hours No results found for this basename: NA:2,K:2,CL:2,CO2:2,GLUCOSE:2,BUN:2,CREATININE:2,CALCIUM:2 in the last 72 hours  Blood smear review: n/a  Pathology:n/a  No results found.  Assessment and Plan: This is a pleasant woman who presents with a history of DVT and pulmonary embolism. He has been anticoagulated since June. My recommendations are that she continue anticoagulation for  total length of one year. At the completion of that time d dimer testing may predict whether she is at further risk for blood clots. In addition we would perform hypercoagulable testing when she has completed her Coumadin determine whether she is at further risk for blood clots. Given that she will have endoscopy colonoscopy and mammogram next month I would suspect that she  Will haveeffective screening for underlying malignancies. I do not believe that there is a significant contraindication to undergoing knee surgery assuming she did undergo with bridging  Lovenox to stop  Coumadin and resumption of anticoagulation after surgery. This patient will be followed by Dr. Mariel Sleet who will likely see her in the next  4 months.

## 2011-01-04 MED ORDER — SODIUM CHLORIDE 0.45 % IV SOLN
Freq: Once | INTRAVENOUS | Status: AC
  Administered 2011-01-05: 09:00:00 via INTRAVENOUS

## 2011-01-04 NOTE — Telephone Encounter (Signed)
REVIEWED. AGREE. 

## 2011-01-05 ENCOUNTER — Encounter (HOSPITAL_COMMUNITY)
Admission: RE | Disposition: A | Discharge status: Home / Self Care | Payer: Self-pay | Source: Ambulatory Visit | Attending: Gastroenterology

## 2011-01-05 ENCOUNTER — Ambulatory Visit (HOSPITAL_COMMUNITY)
Admission: RE | Admit: 2011-01-05 | Discharge: 2011-01-05 | Disposition: A | Discharge status: Home / Self Care | Payer: Medicare HMO | Source: Ambulatory Visit | Attending: Gastroenterology | Admitting: Gastroenterology

## 2011-01-05 ENCOUNTER — Other Ambulatory Visit: Payer: Self-pay | Admitting: Gastroenterology

## 2011-01-05 ENCOUNTER — Encounter (HOSPITAL_COMMUNITY): Payer: Self-pay

## 2011-01-05 DIAGNOSIS — K2990 Gastroduodenitis, unspecified, without bleeding: Secondary | ICD-10-CM

## 2011-01-05 DIAGNOSIS — D126 Benign neoplasm of colon, unspecified: Secondary | ICD-10-CM

## 2011-01-05 DIAGNOSIS — K5730 Diverticulosis of large intestine without perforation or abscess without bleeding: Secondary | ICD-10-CM | POA: Insufficient documentation

## 2011-01-05 DIAGNOSIS — K648 Other hemorrhoids: Secondary | ICD-10-CM | POA: Insufficient documentation

## 2011-01-05 DIAGNOSIS — D649 Anemia, unspecified: Secondary | ICD-10-CM

## 2011-01-05 DIAGNOSIS — K2940 Chronic atrophic gastritis without bleeding: Secondary | ICD-10-CM | POA: Insufficient documentation

## 2011-01-05 DIAGNOSIS — Z01812 Encounter for preprocedural laboratory examination: Secondary | ICD-10-CM | POA: Insufficient documentation

## 2011-01-05 DIAGNOSIS — R1310 Dysphagia, unspecified: Secondary | ICD-10-CM | POA: Insufficient documentation

## 2011-01-05 DIAGNOSIS — Z7901 Long term (current) use of anticoagulants: Secondary | ICD-10-CM | POA: Insufficient documentation

## 2011-01-05 DIAGNOSIS — D509 Iron deficiency anemia, unspecified: Secondary | ICD-10-CM

## 2011-01-05 DIAGNOSIS — K222 Esophageal obstruction: Secondary | ICD-10-CM | POA: Insufficient documentation

## 2011-01-05 DIAGNOSIS — K2970 Gastritis, unspecified, without bleeding: Secondary | ICD-10-CM

## 2011-01-05 SURGERY — COLONOSCOPY WITH ESOPHAGOGASTRODUODENOSCOPY (EGD)
Anesthesia: Moderate Sedation

## 2011-01-05 MED ORDER — MEPERIDINE HCL 100 MG/ML IJ SOLN
INTRAMUSCULAR | Status: DC | PRN
  Administered 2011-01-05: 50 mg via INTRAVENOUS
  Administered 2011-01-05 (×2): 25 mg via INTRAVENOUS

## 2011-01-05 MED ORDER — STERILE WATER FOR IRRIGATION IR SOLN
Status: DC | PRN
  Administered 2011-01-05: 10:00:00

## 2011-01-05 MED ORDER — BUTAMBEN-TETRACAINE-BENZOCAINE 2-2-14 % EX AERO
INHALATION_SPRAY | CUTANEOUS | Status: DC | PRN
  Administered 2011-01-05: 2 via TOPICAL

## 2011-01-05 MED ORDER — MIDAZOLAM HCL 5 MG/5ML IJ SOLN
INTRAMUSCULAR | Status: AC
  Filled 2011-01-05: qty 10

## 2011-01-05 MED ORDER — MINERAL OIL PO OIL
TOPICAL_OIL | ORAL | Status: AC
  Filled 2011-01-05: qty 30

## 2011-01-05 MED ORDER — MEPERIDINE HCL 100 MG/ML IJ SOLN
INTRAMUSCULAR | Status: AC
  Filled 2011-01-05: qty 2

## 2011-01-05 MED ORDER — MIDAZOLAM HCL 5 MG/5ML IJ SOLN
INTRAMUSCULAR | Status: DC | PRN
  Administered 2011-01-05 (×2): 2 mg via INTRAVENOUS
  Administered 2011-01-05: 1 mg via INTRAVENOUS

## 2011-01-05 NOTE — H&P (View-Only) (Signed)
Subjective:    Patient ID: [name], female    DOB: [birth year], 70 y.o.   MRN: [id number]  PCP: Amanda Cockayne CARDS: ROTHBART  HPI JUN 2012 had clots in her legs and lungs. Been on Coumadin sine. No blood in her stool or black tarry stools. No problems with nausea or vomiting usually. Had nausea and vomiting and diarrhea for 24 hours. BMs: regular-1x/d. Solid can be difficult to go down. Had esophagus stretched and TCS > 10 years ago.  No prior Hx low blood. Rare RUQ pain. No weight loss or loss of appetite.   No surgeries on chest or abdomen. No problems with sedation.  Past Medical History  Diagnosis Date  . Atrial fibrillation   . Hyperlipidemia   . Hypertension   . Overweight   . Depression   . Diabetes mellitus   . Migraine   . Degenerative joint disease   . Gastroesophageal reflux disease   . Glaucoma   . Pulmonary embolism May 2012    acute presentation, bilateral PE  . Chronic anticoagulation 07/21/2010  . S/P colonoscopy Jan 2002    Dr. Elpidio Anis: external hemorrhoids, normal colon     Past Surgical History  Procedure Date  . Tonsillectomy   . Dilation and curettage of uterus   . Urethral dilation   . Left knee arthroscopy 1970s    Allergies  Allergen Reactions  . Codeine   . Sulfonamide Derivatives     Current Outpatient Prescriptions  Medication Sig Dispense Refill  . amLODipine (NORVASC) 10 MG tablet Take 1 tablet (10 mg total) by mouth daily.  90 tablet  1  . Blood Glucose Monitoring Suppl (ACCU-CHEK AVIVA PLUS) W/DEVICE KIT 1 kit by Does not apply route daily. Once daily testing 250.00  1 kit  0  . ferrous sulfate 325 (65 FE) MG tablet Take 1 tablet (325 mg total) by mouth 2 (two) times daily.  180 tablet  1  . Fiber POWD Take by mouth.        . furosemide (LASIX) 40 MG tablet Take one half to one tablet by mouth once daily as needed  90 tablet  1  . glucose blood (ACCU-CHEK AVIVA) test strip Once daily testing Dx:250.00  100 each  2  .  HYDROcodone-acetaminophen (NORCO) 5-325 MG per tablet One tablet  daily as needed for severe pain  30 tablet  5  . imipramine (TOFRANIL) 50 MG tablet Take 1 tablet (50 mg total) by mouth at bedtime. Take 2 tablets by mouth   180 tablet  1  . Lancets (ACCU-CHEK MULTICLIX) lancets Once daily testing Dx: 250.00  100 each  2  . latanoprost (XALATAN) 0.005 % ophthalmic solution 1 drop daily.       Marland Kitchen losartan-hydrochlorothiazide (HYZAAR) 50-12.5 MG per tablet Take 1 tablet by mouth daily.  90 tablet  1  . meclizine (ANTIVERT) 25 MG tablet Take 1 tablet (25 mg total) by mouth 3 (three) times daily as needed.  30 tablet  0  . metFORMIN (GLUCOPHAGE) 500 MG tablet Take 1 tablet (500 mg total) by mouth 2 (two) times daily with a meal.  180 tablet  0  . omeprazole (PRILOSEC) 40 MG capsule Take 1 capsule (40 mg total) by mouth daily.  90 capsule  1  . potassium gluconate 595 MG TABS Take 595 mg by mouth daily.        . pravastatin (PRAVACHOL) 20 MG tablet Take 1 tablet (20 mg total) by mouth every  evening.  90 tablet  3  . propranolol (INDERAL) 80 MG tablet Take 1 tablet (80 mg total) by mouth daily.  90 tablet  1  . temazepam (RESTORIL) 30 MG capsule Take 1 capsule (30 mg total) by mouth at bedtime as needed.  90 capsule  1  . topiramate (TOPAMAX) 100 MG tablet Take 1 tablet (100 mg total) by mouth 2 (two) times daily.  180 tablet  1  . warfarin (COUMADIN) 7.5 MG tablet Take 7.5 mg by mouth as directed. Monday, Wednesday,Firday take 7.5 mg then on  Sunday, Tuesday, Thursday, Saturday take 3.75 mg       . enoxaparin (LOVENOX) 150 MG/ML injection Inject 0.99 mLs (150 mg total) into the skin daily.  3 mL  1      Review of Systems     Objective:   Physical Exam  Vitals reviewed. Constitutional: She is oriented to person, place, and time. She appears well-developed and well-nourished.  HENT:  Head: Normocephalic and atraumatic.  Mouth/Throat: Oropharynx is clear and moist. No oropharyngeal exudate.    Eyes: Pupils are equal, round, and reactive to light. No scleral icterus.  Neck: Normal range of motion. Neck supple.  Cardiovascular: Normal rate, regular rhythm and normal heart sounds.   Pulmonary/Chest: Effort normal and breath sounds normal.  Abdominal: Soft. Bowel sounds are normal. She exhibits no distension. There is no tenderness.  Musculoskeletal: She exhibits no edema.  Lymphadenopathy:    She has no cervical adenopathy.  Neurological: She is alert and oriented to person, place, and time.       NO FOCAL DEFICITS   Psychiatric: She has a normal mood and affect.          Assessment & Plan:

## 2011-01-05 NOTE — Interval H&P Note (Signed)
History and Physical Interval Note:   01/05/2011   9:30 AM   [name]  has presented today for surgery, with the diagnosis of ANEMIA  The various methods of treatment have been discussed with the patient and family. After consideration of risks, benefits and other options for treatment, the patient has consented to  Procedure(s): COLONOSCOPY WITH ESOPHAGOGASTRODUODENOSCOPY (EGD) as a surgical intervention .  The patients' history has been reviewed, patient examined, no change in status, stable for surgery.  I have reviewed the patients' chart and labs.  Questions were answered to the patient's satisfaction.     Jonette Eva  MD  THE PATIENT WAS EXAMINED AND THERE IS NO CHANGE IN THE PATIENT'S CONDITION SINCE THE ORIGINAL H&P WAS COMPLETED.

## 2011-01-05 NOTE — H&P (Signed)
Reason for Visit     Nausea    Emesis        Vitals - Last Recorded       BP Pulse Temp(Src) Ht Wt BMI    130/84  80  97.6 F (36.4 C) (Temporal)  5\' 5"  (1.651 m)  218 lb 12.8 oz (99.247 kg)  36.41 kg/m2       Vitals History Recorded       Progress Notes     Jonette Eva, MD  12/17/2010  4:25 PM  Signed    Subjective:      Patient ID: [name], female    DOB: Aug 28, 1940, 70 y.o.   MRN: [id number]   PCP: Amanda Cockayne CARDS: ROTHBART   HPI JUN 2012 had clots in her legs and lungs. Been on Coumadin sine. No blood in her stool or black tarry stools. No problems with nausea or vomiting usually. Had nausea and vomiting and diarrhea for 24 hours. BMs: regular-1x/d. Solid can be difficult to go down. Had esophagus stretched and TCS > 10 years ago.  No prior Hx low blood. Rare RUQ pain. No weight loss or loss of appetite.    No surgeries on chest or abdomen. No problems with sedation.    Past Medical History   Diagnosis  Date   .  Atrial fibrillation     .  Hyperlipidemia     .  Hypertension     .  Overweight     .  Depression     .  Diabetes mellitus     .  Migraine     .  Degenerative joint disease     .  Gastroesophageal reflux disease     .  Glaucoma     .  Pulmonary embolism  May 2012       acute presentation, bilateral PE   .  Chronic anticoagulation  07/21/2010   .  S/P colonoscopy  Jan 2002       Dr. Elpidio Anis: external hemorrhoids, normal colon        Past Surgical History   Procedure  Date   .  Tonsillectomy     .  Dilation and curettage of uterus     .  Urethral dilation     .  Left knee arthroscopy  1970s       Allergies   Allergen  Reactions   .  Codeine     .  Sulfonamide Derivatives         Current Outpatient Prescriptions   Medication  Sig  Dispense  Refill   .  amLODipine (NORVASC) 10 MG tablet  Take 1 tablet (10 mg total) by mouth daily.   90 tablet   1   .  Blood Glucose Monitoring Suppl (ACCU-CHEK AVIVA PLUS) W/DEVICE KIT  1  kit by Does not apply route daily. Once daily testing  250.00   1 kit   0   .  ferrous sulfate 325 (65 FE) MG tablet  Take 1 tablet (325 mg total) by mouth 2 (two) times daily.   180 tablet   1   .  Fiber POWD  Take by mouth.           .  furosemide (LASIX) 40 MG tablet  Take one half to one tablet by mouth once daily as needed   90 tablet   1   .  glucose blood (ACCU-CHEK AVIVA) test strip  Once daily testing  Dx:250.00   100 each   2   .  HYDROcodone-acetaminophen (NORCO) 5-325 MG per tablet  One tablet  daily as needed for severe pain   30 tablet   5   .  imipramine (TOFRANIL) 50 MG tablet  Take 1 tablet (50 mg total) by mouth at bedtime. Take 2 tablets by mouth     180 tablet   1   .  Lancets (ACCU-CHEK MULTICLIX) lancets  Once daily testing  Dx: 250.00   100 each   2   .  latanoprost (XALATAN) 0.005 % ophthalmic solution  1 drop daily.          Marland Kitchen  losartan-hydrochlorothiazide (HYZAAR) 50-12.5 MG per tablet  Take 1 tablet by mouth daily.   90 tablet   1   .  meclizine (ANTIVERT) 25 MG tablet  Take 1 tablet (25 mg total) by mouth 3 (three) times daily as needed.   30 tablet   0   .  metFORMIN (GLUCOPHAGE) 500 MG tablet  Take 1 tablet (500 mg total) by mouth 2 (two) times daily with a meal.   180 tablet   0   .  omeprazole (PRILOSEC) 40 MG capsule  Take 1 capsule (40 mg total) by mouth daily.   90 capsule   1   .  potassium gluconate 595 MG TABS  Take 595 mg by mouth daily.           .  pravastatin (PRAVACHOL) 20 MG tablet  Take 1 tablet (20 mg total) by mouth every evening.   90 tablet   3   .  propranolol (INDERAL) 80 MG tablet  Take 1 tablet (80 mg total) by mouth daily.   90 tablet   1   .  temazepam (RESTORIL) 30 MG capsule  Take 1 capsule (30 mg total) by mouth at bedtime as needed.   90 capsule   1   .  topiramate (TOPAMAX) 100 MG tablet  Take 1 tablet (100 mg total) by mouth 2 (two) times daily.   180 tablet   1   .  warfarin (COUMADIN) 7.5 MG tablet  Take 7.5 mg by mouth as directed.  Monday, Wednesday,Firday take 7.5 mg then on    Sunday, Tuesday, Thursday, Saturday take 3.75 mg          .  enoxaparin (LOVENOX) 150 MG/ML injection  Inject 0.99 mLs (150 mg total) into the skin daily.   3 mL   1          Review of Systems     Objective:    Physical Exam  Vitals reviewed. Constitutional: She is oriented to person, place, and time. She appears well-developed and well-nourished.  HENT:   Head: Normocephalic and atraumatic.   Mouth/Throat: Oropharynx is clear and moist. No oropharyngeal exudate.  Eyes: Pupils are equal, round, and reactive to light. No scleral icterus.  Neck: Normal range of motion. Neck supple.  Cardiovascular: Normal rate, regular rhythm and normal heart sounds.   Pulmonary/Chest: Effort normal and breath sounds normal.  Abdominal: Soft. Bowel sounds are normal. She exhibits no distension. There is no tenderness.  Musculoskeletal: She exhibits no edema.  Lymphadenopathy:    She has no cervical adenopathy.  Neurological: She is alert and oriented to person, place, and time.       NO FOCAL DEFICITS  Psychiatric: She has a normal mood and affect.  Assessment & Plan:        [name]  12/17/2010  4:40 PM  Signed Reminder in epic to follow up in 6 months with SF  Leigh A Watson  12/17/2010  4:48 PM  Signed Cc to PCP     Anemia - Jonette Eva, MD  12/17/2010  2:50 PM  Addendum FeDA-differential diagnosis include colon polyps, less likey colon ca or AVMs.   OPV in 6 mos. TCS/EGD/?ED-MOVIPREP   HOLD IRON FOR 7 DAYS. Continue metformin. Stop coumadin 5 days before procedure. Start Lovenox 1.5 mg/kg qd- 3 days prior to procedure. HOLD LOVENOX ON DAY OF PROCEDURE. Hematology referral to assess need for chronic Coumadin.  Previous Version  Iron deficiency anemia - Jonette Eva, MD  12/17/2010  4:24 PM  Signed MOST LIKELY 2o TO COLON POLYPS/GASTRITIS. Doubt colon ca or gastric ca.   FOLLOW UP in 6 mos. COLONOSCOPY  AND UPPER ENDOSCOPY IN NOV 2012. HOLD IRON FOR 7 DAYS. Continue metformin. Stop coumadin 5 days before procedure. Start Lovenox 150 MG SQ QD- 3 days prior to procedure. HOLD LOVENOX ON DAY OF PROCEDURE. Hematology referral to assess need for chronic Coumadin.

## 2011-01-08 NOTE — Assessment & Plan Note (Signed)
Controlled, no change in medication EKG shows noLVH, NSR, no ischemia

## 2011-01-08 NOTE — Assessment & Plan Note (Signed)
Uncontrolled additional med added 

## 2011-01-08 NOTE — Assessment & Plan Note (Signed)
Controlled, no change in medication  

## 2011-01-08 NOTE — Assessment & Plan Note (Signed)
Recent flare, meclizine prescribed

## 2011-01-08 NOTE — Progress Notes (Signed)
  Subjective:    Patient ID: [name], female    DOB: 03-02-1940, 70 y.o.   MRN: [id number]  HPI The PT is here for annual exam and re-evaluation of chronic medical conditions, medication management and review of any available recent lab and radiology data.  Preventive health is updated, specifically  Cancer screening and Immunization.   Questions or concerns regarding consultations or procedures which the PT has had in the interim are  addressed. The PT denies any adverse reactions to current medications since the last visit.  C/o intermittent episodes of vertigo, as well as uncontrolled back pain.     Review of Systems See HPI Denies recent fever or chills. Denies sinus pressure, nasal congestion, ear pain or sore throat. Denies chest congestion, productive cough or wheezing. Denies chest pains, palpitations and leg swelling Denies abdominal pain, nausea, vomiting,diarrhea or constipation.   Denies dysuria, frequency, hesitancy or incontinence. C/o uncontrolled back pain Denies headaches, seizures, numbness, or tingling. Denies depression, anxiety or insomnia. Denies skin break down or rash.        Objective:   Physical Exam Pleasant  obese female, alert and oriented x 3, in no cardio-pulmonary distress. Afebrile. HEENT No facial trauma or asymetry. Sinuses non tender.  EOMI, PERTL, fundoscopic exam is normal, no hemorhage or exudate.  External ears normal, tympanic membranes clear. Oropharynx moist, no exudate, poor dentition. Neck: supple, no adenopathy,JVD or thyromegaly.No bruits.  Chest: Clear to ascultation bilaterally.No crackles or wheezes. Non tender to palpation  Breast: No asymetry,no masses. No nipple discharge or inversion. No axillary or supraclavicular adenopathy  Cardiovascular system; Heart sounds normal,  S1 and  S2 ,no S3.  No murmur, or thrill. Apical beat not displaced Peripheral pulses normal.  Abdomen: Soft, non tender, no  organomegaly or masses. No bruits. Bowel sounds normal. No guarding, tenderness or rebound.  Rectal:  No mass. Guaiac negative stool.  GU: External genitalia normal. No lesions. Vaginal canal normal.No discharge. Uterus normal size, no adnexal masses, no cervical motion or adnexal tenderness.  Musculoskeletal exam: Reduced ROM of spine, hips , shoulders and knees. No deformity ,swelling or crepitus noted. No muscle wasting or atrophy.   Neurologic: Cranial nerves 2 to 12 intact. Power, tone ,sensation and reflexes normal throughout. No disturbance in gait. No tremor.  Skin: Intact, no ulceration, erythema , scaling or rash noted. Pigmentation normal throughout  Psych; Normal mood and affect. Judgement and concentration normal      Assessment & Plan:

## 2011-01-11 ENCOUNTER — Encounter: Payer: Medicare HMO | Admitting: *Deleted

## 2011-01-11 ENCOUNTER — Telehealth: Payer: Self-pay | Admitting: Gastroenterology

## 2011-01-11 NOTE — Telephone Encounter (Signed)
Called pt to discuss. HER stomach Bx shows mild gastritis. Continue OMP 30 minutes prior to meals ONCE A DAY. She had two simple adenomas. She needs one completely removed. Pt needs capsule endoscopy within the next month to complete evaluation for her FeDA, Dx: obscure GIB. She needs a repeat TCS in 1 year WITH OVERTUBE & PROPOFOL DUE TO POLYPHARMACY. OPV IN 6 MOS E 30 VISIT.  Spoke with pt she stated she needs to pay down her bills first before she has a camera pill. PT WILL CALL ME AND LET ME KNOW WHEN SHE'S READY.  Hope to hear from her before her next OPV in 6 mos. Pt voiced her understanding.

## 2011-01-14 ENCOUNTER — Ambulatory Visit (INDEPENDENT_AMBULATORY_CARE_PROVIDER_SITE_OTHER): Payer: Medicare HMO | Admitting: *Deleted

## 2011-01-14 DIAGNOSIS — I2699 Other pulmonary embolism without acute cor pulmonale: Secondary | ICD-10-CM

## 2011-01-14 DIAGNOSIS — Z7901 Long term (current) use of anticoagulants: Secondary | ICD-10-CM

## 2011-01-14 LAB — POCT INR: INR: 1.8

## 2011-01-14 NOTE — Telephone Encounter (Signed)
Results Cc to PCP  

## 2011-01-18 ENCOUNTER — Ambulatory Visit (HOSPITAL_COMMUNITY): Payer: Medicare HMO | Admitting: Oncology

## 2011-01-19 NOTE — Telephone Encounter (Signed)
Reminder in epic to follow up in 6 months with SF in E30 visit and also to repeat tcs in one year with overtube and propofol due to polypharmacy

## 2011-02-01 ENCOUNTER — Ambulatory Visit (INDEPENDENT_AMBULATORY_CARE_PROVIDER_SITE_OTHER): Payer: Medicare HMO | Admitting: *Deleted

## 2011-02-01 DIAGNOSIS — Z7901 Long term (current) use of anticoagulants: Secondary | ICD-10-CM

## 2011-02-01 DIAGNOSIS — I2699 Other pulmonary embolism without acute cor pulmonale: Secondary | ICD-10-CM

## 2011-02-01 LAB — POCT INR: INR: 2.6

## 2011-03-01 ENCOUNTER — Ambulatory Visit (INDEPENDENT_AMBULATORY_CARE_PROVIDER_SITE_OTHER): Payer: Medicare Other | Admitting: *Deleted

## 2011-03-01 DIAGNOSIS — I2699 Other pulmonary embolism without acute cor pulmonale: Secondary | ICD-10-CM

## 2011-03-01 DIAGNOSIS — Z7901 Long term (current) use of anticoagulants: Secondary | ICD-10-CM

## 2011-03-08 ENCOUNTER — Encounter: Payer: Self-pay | Admitting: Family Medicine

## 2011-03-10 ENCOUNTER — Ambulatory Visit (INDEPENDENT_AMBULATORY_CARE_PROVIDER_SITE_OTHER): Payer: Self-pay | Admitting: Family Medicine

## 2011-03-10 ENCOUNTER — Encounter: Payer: Self-pay | Admitting: Family Medicine

## 2011-03-10 ENCOUNTER — Other Ambulatory Visit: Payer: Self-pay | Admitting: Family Medicine

## 2011-03-10 VITALS — BP 110/74 | HR 74 | Resp 16 | Ht 65.0 in | Wt 217.0 lb

## 2011-03-10 DIAGNOSIS — N390 Urinary tract infection, site not specified: Secondary | ICD-10-CM

## 2011-03-10 DIAGNOSIS — E119 Type 2 diabetes mellitus without complications: Secondary | ICD-10-CM

## 2011-03-10 DIAGNOSIS — N3000 Acute cystitis without hematuria: Secondary | ICD-10-CM

## 2011-03-10 DIAGNOSIS — Z139 Encounter for screening, unspecified: Secondary | ICD-10-CM

## 2011-03-10 DIAGNOSIS — R42 Dizziness and giddiness: Secondary | ICD-10-CM

## 2011-03-10 DIAGNOSIS — I2699 Other pulmonary embolism without acute cor pulmonale: Secondary | ICD-10-CM

## 2011-03-10 DIAGNOSIS — I10 Essential (primary) hypertension: Secondary | ICD-10-CM

## 2011-03-10 DIAGNOSIS — F329 Major depressive disorder, single episode, unspecified: Secondary | ICD-10-CM

## 2011-03-10 DIAGNOSIS — E663 Overweight: Secondary | ICD-10-CM

## 2011-03-10 DIAGNOSIS — E785 Hyperlipidemia, unspecified: Secondary | ICD-10-CM

## 2011-03-10 DIAGNOSIS — M1710 Unilateral primary osteoarthritis, unspecified knee: Secondary | ICD-10-CM

## 2011-03-10 DIAGNOSIS — K219 Gastro-esophageal reflux disease without esophagitis: Secondary | ICD-10-CM

## 2011-03-10 MED ORDER — FUROSEMIDE 40 MG PO TABS: ORAL_TABLET | ORAL | Status: DC

## 2011-03-10 MED ORDER — FLUOXETINE HCL 10 MG PO CAPS: 10.0000 mg | ORAL_CAPSULE | Freq: Every day | ORAL | Status: DC

## 2011-03-10 MED ORDER — METFORMIN HCL 500 MG PO TABS: 500.0000 mg | ORAL_TABLET | Freq: Two times a day (BID) | ORAL | Status: DC

## 2011-03-10 MED ORDER — OMEPRAZOLE 40 MG PO CPDR: 40.0000 mg | DELAYED_RELEASE_CAPSULE | Freq: Every day | ORAL | Status: DC

## 2011-03-10 MED ORDER — TOPIRAMATE 100 MG PO TABS: 100.0000 mg | ORAL_TABLET | Freq: Two times a day (BID) | ORAL | Status: DC

## 2011-03-10 MED ORDER — PROPRANOLOL HCL 80 MG PO TABS: 80.0000 mg | ORAL_TABLET | Freq: Every day | ORAL | Status: DC

## 2011-03-10 MED ORDER — PRAVASTATIN SODIUM 20 MG PO TABS: 20.0000 mg | ORAL_TABLET | Freq: Every evening | ORAL | Status: DC

## 2011-03-10 MED ORDER — HYDROCODONE-ACETAMINOPHEN 5-325 MG PO TABS: ORAL_TABLET | ORAL | Status: DC

## 2011-03-10 MED ORDER — IMIPRAMINE HCL 50 MG PO TABS: 50.0000 mg | ORAL_TABLET | Freq: Every day | ORAL | Status: DC

## 2011-03-10 MED ORDER — LOSARTAN POTASSIUM-HCTZ 50-12.5 MG PO TABS: 1.0000 | ORAL_TABLET | Freq: Every day | ORAL | Status: DC

## 2011-03-10 NOTE — Patient Instructions (Signed)
F/U in 3 months.  New medication is started for depression.  You are being referred to the [facility], a case worker will call you and meet with you to see how your health care needs can be met without it being so overwhelming.  Fasting lipid, cmp and eGFR and HBA1C are due. I hope that you start feeling better .  Your urine is being checked for infection, and you will be treated based on the result

## 2011-03-11 LAB — POCT URINALYSIS DIPSTICK
Glucose, UA: NEGATIVE
Ketones, UA: NEGATIVE
Protein, UA: NEGATIVE
Spec Grav, UA: 1.02
Urobilinogen, UA: 0.2

## 2011-03-13 DIAGNOSIS — N3000 Acute cystitis without hematuria: Secondary | ICD-10-CM | POA: Insufficient documentation

## 2011-03-13 NOTE — Progress Notes (Signed)
  Subjective:    Patient ID: [name], female    DOB: [birth year], 71 y.o.   MRN: [id number]  HPI The PT is here for follow up and re-evaluation of chronic medical conditions, medication management and review of any available recent lab and radiology data.  Preventive health is updated, specifically  Cancer screening and Immunization.   Questions or concerns regarding consultations or procedures which the PT has had in the interim are  addressed. The PT denies any adverse reactions to current medications since the last visit.    Tearful and overwhelmed, states she is "just tired" Overwhelmed with medical bills, tests and failing health, esp with respect to chronic uncontrolled left knee pain with reduced mobility. Has been to hematology with regard to iron def anemia, is supposed to have further GI eval to determine bleeding source, but is hesitant to/does not want to be re evaluated by current [facility] doc reportedly, seems primarily because of billin issue to agreat extent. I will refer her to case worker from [facility] to see if she can get well needed help    Review of Systems See HPI Denies recent fever or chills. Denies sinus pressure, nasal congestion, ear pain or sore throat. Denies chest congestion, productive cough or wheezing. Denies chest pains, palpitations and leg swelling Denies abdominal pain, nausea, vomiting,diarrhea or constipation.   Frequency and mild dysuria x 3 days, no flank pain Denies headaches, seizures, numbness, or tingling.  Denies skin break down or rash.        Objective:   Physical Exam Patient alert and oriented and in no cardiopulmonary distress.  HEENT: No facial asymmetry, EOMI, no sinus tenderness,  oropharynx pink and moist.  Neck supple no adenopathy.  Chest: Clear to auscultation bilaterally.  CVS: S1, S2 no murmurs, no S3.  ABD: Soft non tender. Bowel sounds normal.  Ext: No edema  MS: Adequate ROM spine, shoulders, hips and markedly  decreased in left  .  Skin: Intact, no ulcerations or rash noted.  Psych: Good eye contact tearful, angry and depressed l affect. Memory intact .  CNS: CN 2-12 intact, power, tone and sensation normal throughout.        Assessment & Plan:

## 2011-03-13 NOTE — Assessment & Plan Note (Signed)
Abnormal CCUA, and symptomatic mildly, will wait on c/s result

## 2011-03-13 NOTE — Assessment & Plan Note (Signed)
Controlled, no change in medication  

## 2011-03-13 NOTE — Assessment & Plan Note (Signed)
Deteriorating, is considering surgery in the Spring

## 2011-03-13 NOTE — Assessment & Plan Note (Signed)
Chronic anti coagulation through coumadin clinic 

## 2011-03-13 NOTE — Assessment & Plan Note (Signed)
Uncontrolled, updated labs in Feb, continue current med

## 2011-03-13 NOTE — Assessment & Plan Note (Addendum)
Patient re-educated about  the importance of commitment to a  minimum of 150 minutes of exercise per week. The importance of healthy food choices with portion control discussed. Encouraged to start a food diary, count calories and to consider  joining a support group. Sample diet sheets offered. Goals set by the patient for the next several months.    

## 2011-03-14 ENCOUNTER — Other Ambulatory Visit: Payer: Self-pay | Admitting: Family Medicine

## 2011-03-14 LAB — URINE CULTURE: Colony Count: >=100000

## 2011-03-15 ENCOUNTER — Ambulatory Visit (HOSPITAL_COMMUNITY): Payer: Medicare Other

## 2011-03-19 ENCOUNTER — Other Ambulatory Visit: Payer: Self-pay

## 2011-03-19 MED ORDER — NITROFURANTOIN MONOHYD MACRO 100 MG PO CAPS: 100.0000 mg | ORAL_CAPSULE | Freq: Two times a day (BID) | ORAL | Status: DC

## 2011-03-22 ENCOUNTER — Ambulatory Visit (INDEPENDENT_AMBULATORY_CARE_PROVIDER_SITE_OTHER): Payer: Medicare Other | Admitting: *Deleted

## 2011-03-22 DIAGNOSIS — I2699 Other pulmonary embolism without acute cor pulmonale: Secondary | ICD-10-CM

## 2011-03-22 DIAGNOSIS — Z7901 Long term (current) use of anticoagulants: Secondary | ICD-10-CM

## 2011-04-07 ENCOUNTER — Telehealth: Payer: Self-pay | Admitting: Family Medicine

## 2011-04-07 DIAGNOSIS — I10 Essential (primary) hypertension: Secondary | ICD-10-CM

## 2011-04-08 ENCOUNTER — Other Ambulatory Visit: Payer: Self-pay

## 2011-04-08 DIAGNOSIS — I10 Essential (primary) hypertension: Secondary | ICD-10-CM

## 2011-04-08 MED ORDER — IMIPRAMINE HCL 50 MG PO TABS: 50.0000 mg | ORAL_TABLET | Freq: Every day | ORAL | Status: DC

## 2011-04-09 MED ORDER — IMIPRAMINE HCL 50 MG PO TABS: ORAL_TABLET | ORAL | Status: DC

## 2011-04-09 NOTE — Telephone Encounter (Signed)
Pt aware and med sent in for 90 days [facility]

## 2011-04-12 ENCOUNTER — Ambulatory Visit (INDEPENDENT_AMBULATORY_CARE_PROVIDER_SITE_OTHER): Payer: Medicare Other | Admitting: *Deleted

## 2011-04-12 DIAGNOSIS — I2699 Other pulmonary embolism without acute cor pulmonale: Secondary | ICD-10-CM

## 2011-04-12 DIAGNOSIS — Z7901 Long term (current) use of anticoagulants: Secondary | ICD-10-CM

## 2011-04-12 LAB — POCT INR: INR: 3

## 2011-04-30 ENCOUNTER — Encounter (HOSPITAL_COMMUNITY): Payer: Medicare Other | Attending: Oncology | Admitting: Oncology

## 2011-05-04 ENCOUNTER — Telehealth: Payer: Self-pay | Admitting: Family Medicine

## 2011-05-04 DIAGNOSIS — M1710 Unilateral primary osteoarthritis, unspecified knee: Secondary | ICD-10-CM

## 2011-05-04 MED ORDER — HYDROCODONE-ACETAMINOPHEN 5-325 MG PO TABS: ORAL_TABLET | ORAL | Status: DC

## 2011-05-04 NOTE — Telephone Encounter (Signed)
Refilled to [facility] as requested

## 2011-05-10 ENCOUNTER — Ambulatory Visit (INDEPENDENT_AMBULATORY_CARE_PROVIDER_SITE_OTHER): Payer: Medicare Other | Admitting: *Deleted

## 2011-05-10 DIAGNOSIS — I2699 Other pulmonary embolism without acute cor pulmonale: Secondary | ICD-10-CM

## 2011-05-10 DIAGNOSIS — Z7901 Long term (current) use of anticoagulants: Secondary | ICD-10-CM

## 2011-05-18 ENCOUNTER — Ambulatory Visit
Admission: RE | Admit: 2011-05-18 | Discharge: 2011-05-18 | Disposition: A | Discharge status: Home / Self Care | Payer: Medicare Other | Source: Ambulatory Visit | Attending: Orthopedic Surgery | Admitting: Orthopedic Surgery

## 2011-05-18 ENCOUNTER — Other Ambulatory Visit: Payer: Self-pay | Admitting: Orthopedic Surgery

## 2011-05-18 DIAGNOSIS — M79605 Pain in left leg: Secondary | ICD-10-CM

## 2011-05-19 ENCOUNTER — Other Ambulatory Visit: Payer: Medicare Other

## 2011-05-20 ENCOUNTER — Other Ambulatory Visit: Payer: Medicare Other

## 2011-05-25 ENCOUNTER — Encounter: Payer: Self-pay | Admitting: Cardiology

## 2011-05-29 ENCOUNTER — Other Ambulatory Visit: Payer: Self-pay | Admitting: Cardiology

## 2011-05-31 ENCOUNTER — Ambulatory Visit (INDEPENDENT_AMBULATORY_CARE_PROVIDER_SITE_OTHER): Payer: Medicare Other | Admitting: *Deleted

## 2011-05-31 DIAGNOSIS — I2699 Other pulmonary embolism without acute cor pulmonale: Secondary | ICD-10-CM

## 2011-05-31 DIAGNOSIS — Z7901 Long term (current) use of anticoagulants: Secondary | ICD-10-CM

## 2011-05-31 LAB — POCT INR: INR: 1.6

## 2011-06-08 ENCOUNTER — Ambulatory Visit: Payer: Medicare Other | Admitting: Family Medicine

## 2011-06-21 ENCOUNTER — Ambulatory Visit (INDEPENDENT_AMBULATORY_CARE_PROVIDER_SITE_OTHER): Payer: Medicare Other | Admitting: Cardiology

## 2011-06-21 ENCOUNTER — Encounter: Payer: Self-pay | Admitting: Cardiology

## 2011-06-21 ENCOUNTER — Ambulatory Visit (INDEPENDENT_AMBULATORY_CARE_PROVIDER_SITE_OTHER): Payer: Medicare Other | Admitting: *Deleted

## 2011-06-21 VITALS — BP 147/92 | HR 81 | Resp 16 | Ht 65.0 in | Wt 219.0 lb

## 2011-06-21 DIAGNOSIS — I2699 Other pulmonary embolism without acute cor pulmonale: Secondary | ICD-10-CM

## 2011-06-21 DIAGNOSIS — E119 Type 2 diabetes mellitus without complications: Secondary | ICD-10-CM

## 2011-06-21 DIAGNOSIS — I10 Essential (primary) hypertension: Secondary | ICD-10-CM

## 2011-06-21 DIAGNOSIS — D509 Iron deficiency anemia, unspecified: Secondary | ICD-10-CM

## 2011-06-21 DIAGNOSIS — Z7901 Long term (current) use of anticoagulants: Secondary | ICD-10-CM

## 2011-06-21 DIAGNOSIS — E785 Hyperlipidemia, unspecified: Secondary | ICD-10-CM

## 2011-06-21 DIAGNOSIS — M1710 Unilateral primary osteoarthritis, unspecified knee: Secondary | ICD-10-CM

## 2011-06-21 DIAGNOSIS — E663 Overweight: Secondary | ICD-10-CM

## 2011-06-21 DIAGNOSIS — M1990 Unspecified osteoarthritis, unspecified site: Secondary | ICD-10-CM

## 2011-06-21 DIAGNOSIS — K219 Gastro-esophageal reflux disease without esophagitis: Secondary | ICD-10-CM

## 2011-06-21 LAB — POCT INR: INR: 3.5

## 2011-06-21 NOTE — Assessment & Plan Note (Signed)
Appears to be well-controlled with modest therapy.

## 2011-06-21 NOTE — Assessment & Plan Note (Addendum)
Blood pressure control is good with current therapy.  Minimally impaired renal function when last assessed in 10/2010 with BUN of 35 and creatinine of 1.15

## 2011-06-21 NOTE — Assessment & Plan Note (Signed)
INRs have been somewhat variable, but therapeutic approximately 60-70% of determinations.  She is now one year out from pulmonary embolism, and discontinuation of anticoagulation for surgery is acceptable.  Warfarin can be held for 4 days prior to surgery with Lovenox 1.5 mg per kilogram administered twice, 24 and 48 hours before the procedure.  Warfarin can be resumed immediately postoperatively with Lovenox coverage once the risk of postoperative hemorrhage is considered minimal.  [facility] will be available as needed in-hospital.

## 2011-06-21 NOTE — Assessment & Plan Note (Signed)
Suboptimal control of lipids when last assessed one year ago.  Dose of pravastatin can be increased as necessary to reduce LDL to less than 100; closer to 70 would be desirable

## 2011-06-21 NOTE — Assessment & Plan Note (Signed)
Anemia is substantial and should be further investigated with iron studies and repeat stool Hemoccults.  If no etiology identified, hematology consultation would be appropriate, if not previously performed.

## 2011-06-21 NOTE — Progress Notes (Deleted)
.  zrr   

## 2011-06-21 NOTE — Assessment & Plan Note (Signed)
Surgical left TKA can proceed with the caveats noted above.

## 2011-06-21 NOTE — Assessment & Plan Note (Deleted)
Surgical left TKA can proceed with the caveats noted above. 

## 2011-06-21 NOTE — Progress Notes (Signed)
Patient ID: HALIA FRANEY, female   DOB: 10-Feb-1941, 71 y.o.   MRN: [id number] HPI: Scheduled return visit for this very mass woman who suffered a pulmonary embolism approximately 9 months ago.  Anticoagulation has been somewhat variable since that event, but she has had no clinical problems including no bleeding complications and no symptoms to suggest deep vein thrombosis or pulmonary embolism.  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  Left TKA is anticipated in near future.  Prior to Admission medications   Medication Sig Start Date End Date Taking? Authorizing Provider  amLODipine (NORVASC) 10 MG tablet Take 1 tablet (10 mg total) by mouth daily. 12/08/10  Yes Kerri Perches, MD  Blood Glucose Monitoring Suppl (ACCU-CHEK AVIVA PLUS) W/DEVICE KIT 1 kit by Does not apply route daily. Once daily testing 250.00 06/01/10  Yes Kerri Perches, MD  ferrous sulfate 325 (65 FE) MG tablet Take 325 mg by mouth daily.   08/05/10 08/05/11 Yes Kerri Perches, MD  Fiber POWD Take by mouth.    Yes Historical Provider, MD  FLUoxetine (PROZAC) 10 MG capsule Take 1 capsule (10 mg total) by mouth daily. 03/10/11 03/09/12 Yes Kerri Perches, MD  furosemide (LASIX) 40 MG tablet Take one half to one tablet by mouth once daily as needed 03/10/11  Yes Kerri Perches, MD  HYDROcodone-acetaminophen [facility]) 5-325 MG per tablet One tablet  daily as needed for severe pain 05/04/11  Yes Kerri Perches, MD  imipramine (TOFRANIL) 50 MG tablet Take 1 tablet (50 mg total) by mouth at bedtime. Take 2 tablets by mouth 04/08/11  Yes Kerri Perches, MD  indomethacin (INDOCIN) 25 MG capsule Take 25 mg by mouth daily.   Yes Historical Provider, MD  latanoprost (XALATAN) 0.005 % ophthalmic solution 1 drop daily.  06/29/10  Yes Historical Provider, MD  losartan-hydrochlorothiazide (HYZAAR) 50-12.5 MG per tablet Take 1 tablet by mouth daily. 03/10/11  Yes Kerri Perches, MD  metFORMIN (GLUCOPHAGE) 500 MG tablet Take 1 tablet (500 mg total) by mouth 2 (two) times daily with a meal. 03/10/11 03/09/12 Yes Kerri Perches, MD  nitrofurantoin, macrocrystal-monohydrate, (MACROBID) 100 MG capsule Take 1 capsule (100 mg total) by mouth 2 (two) times daily. 03/19/11  Yes Kerri Perches, MD  omeprazole (PRILOSEC) 40 MG capsule Take 1 capsule (40 mg total) by mouth daily. 03/10/11  Yes Kerri Perches, MD  potassium gluconate 595 MG TABS Take 595 mg by mouth daily.    Yes Historical Provider, MD  pravastatin (PRAVACHOL) 20 MG tablet Take 1 tablet (20 mg total) by mouth every evening. 03/10/11 03/09/12 Yes Kerri Perches, MD  propranolol (INDERAL) 80 MG tablet Take 1 tablet (80 mg total) by mouth daily. 03/10/11  Yes Kerri Perches, MD  temazepam (RESTORIL) 30 MG capsule Take 1 capsule (30 mg total) by mouth at bedtime as needed. 12/08/10  Yes Kerri Perches, MD  topiramate (TOPAMAX) 100 MG tablet Take 1 tablet (100 mg total) by mouth 2 (two) times daily. 03/10/11  Yes Kerri Perches, MD  warfarin (COUMADIN) 7.5 MG tablet Take 7.5 mg by mouth as directed. Monday, Wednesday,Firday take 7.5 mg then on  Sunday, Tuesday, Thursday, Saturday take 3.75 mg  09/23/10  Yes Kathlen Brunswick, MD    Allergies  Allergen Reactions  . Codeine   . Sulfonamide Derivatives      Past medical history, social history, and family  history reviewed and updated.  ROS: Denies chest pain, orthopnea, PND, lightheadedness, palpitations or syncope.  PHYSICAL EXAM: BP 147/92  Pulse 81  Resp 16  Ht 5\' 5"  (1.651 m)  Wt 99.338 kg (219 lb)  BMI 36.44 kg/m2  General-Well developed; no acute distress Body habitus-Moderately overweight Neck-No JVD; no carotid bruits Lungs-clear lung fields; resonant to percussion Cardiovascular-normal PMI; normal S1 and S2 Abdomen-normal bowel sounds; soft and non-tender without masses or organomegaly Musculoskeletal-Thickened soft tissue  around both knees with decreased range of motion, no cyanosis or clubbing Neurologic-Normal cranial nerves; symmetric strength and tone Skin-Warm, no significant lesions Extremities-distal pulses intact; 1/2-1+ edema  ASSESSMENT AND PLAN:  [place] Bing, MD 06/21/2011 2:15 PM

## 2011-06-21 NOTE — Assessment & Plan Note (Signed)
05/2011 lower extremity venous ultrasound-negative for thrombus.  No evidence for thrombophlebitis preoperatively.

## 2011-06-21 NOTE — Progress Notes (Deleted)
**Note De-Identified  Obfuscation** Name: [name]    DOB: March 18, 1940  Age: 71 y.o.  MR#: [id number]       PCP:  Syliva Overman, MD, MD      Insurance: @PAYORNAME @   CC:    Chief Complaint  Patient presents with  . Appointment    SOB -meds/list    VS BP 147/92  Pulse 81  Resp 16  Ht 5\' 5"  (1.651 m)  Wt 219 lb (99.338 kg)  BMI 36.44 kg/m2  Weights Current Weight  06/21/11 219 lb (99.338 kg)  03/10/11 217 lb (98.431 kg)  12/31/10 218 lb (98.884 kg)    Blood Pressure  BP Readings from Last 3 Encounters:  06/21/11 147/92  03/10/11 110/74  01/05/11 147/94     Admit date:  (Not on file) Last encounter with [facility]:  05/29/2011   Allergy Allergies  Allergen Reactions  . Codeine   . Sulfonamide Derivatives     Current Outpatient Prescriptions  Medication Sig Dispense Refill  . amLODipine (NORVASC) 10 MG tablet Take 1 tablet (10 mg total) by mouth daily.  90 tablet  1  . Blood Glucose Monitoring Suppl (ACCU-CHEK AVIVA PLUS) W/DEVICE KIT 1 kit by Does not apply route daily. Once daily testing 250.00  1 kit  0  . ferrous sulfate 325 (65 FE) MG tablet Take 325 mg by mouth daily.        . Fiber POWD Take by mouth.       Marland Kitchen FLUoxetine (PROZAC) 10 MG capsule Take 1 capsule (10 mg total) by mouth daily.  90 capsule  1  . furosemide (LASIX) 40 MG tablet Take one half to one tablet by mouth once daily as needed  90 tablet  1  . HYDROcodone-acetaminophen (NORCO) 5-325 MG per tablet One tablet  daily as needed for severe pain  90 tablet  0  . imipramine (TOFRANIL) 50 MG tablet Take 1 tablet (50 mg total) by mouth at bedtime. Take 2 tablets by mouth  30 tablet  0  . indomethacin (INDOCIN) 25 MG capsule Take 25 mg by mouth daily.      Marland Kitchen latanoprost (XALATAN) 0.005 % ophthalmic solution 1 drop daily.       Marland Kitchen losartan-hydrochlorothiazide (HYZAAR) 50-12.5 MG per tablet Take 1 tablet by mouth daily.  90 tablet  1  . metFORMIN (GLUCOPHAGE) 500 MG tablet Take 1 tablet (500 mg total) by mouth 2 (two) times daily with a meal.   180 tablet  1  . nitrofurantoin, macrocrystal-monohydrate, (MACROBID) 100 MG capsule Take 1 capsule (100 mg total) by mouth 2 (two) times daily.  14 capsule  0  . omeprazole (PRILOSEC) 40 MG capsule Take 1 capsule (40 mg total) by mouth daily.  90 capsule  1  . potassium gluconate 595 MG TABS Take 595 mg by mouth daily.       . pravastatin (PRAVACHOL) 20 MG tablet Take 1 tablet (20 mg total) by mouth every evening.  90 tablet  1  . propranolol (INDERAL) 80 MG tablet Take 1 tablet (80 mg total) by mouth daily.  90 tablet  1  . temazepam (RESTORIL) 30 MG capsule Take 1 capsule (30 mg total) by mouth at bedtime as needed.  90 capsule  1  . topiramate (TOPAMAX) 100 MG tablet Take 1 tablet (100 mg total) by mouth 2 (two) times daily.  180 tablet  1  . warfarin (COUMADIN) 7.5 MG tablet Take 7.5 mg by mouth as directed. Monday, Wednesday,Firday take 7.5 mg then **Note De-Identified  Obfuscation** on  Sunday, Tuesday, Thursday, Saturday take 3.75 mg       . DISCONTD: ferrous sulfate 325 (65 FE) MG tablet Take 1 tablet (325 mg total) by mouth 2 (two) times daily.  180 tablet  1  . DISCONTD: imipramine (TOFRANIL) 50 MG tablet Take 2 tablets by mouth daily  180 tablet  1  . DISCONTD: warfarin (COUMADIN) 7.5 MG tablet TAKE 1 TABLET (7.5 MG TOTAL) BY MOUTH AS DIRECTED.  45 tablet  3    Discontinued Meds:    Medications Discontinued During This Encounter  Medication Reason  . imipramine (TOFRANIL) 50 MG tablet Error  . warfarin (COUMADIN) 7.5 MG tablet Error    Patient Active Problem List  Diagnoses  . DIABETES MELLITUS  . HYPERLIPIDEMIA  . OVERWEIGHT/OBESITY  . MIGRAINE HEADACHE  . HYPERTENSION  . GERD  . OSTEOARTHRITIS, KNEE, LEFT  . INTERMITTENT VERTIGO  . Insomnia  . Pulmonary embolism  . Chronic anticoagulation  . Iron deficiency anemia  . Anemia  . Chest pain  . Cystitis, acute    LABS Anti-coag visit on 06/21/2011  Component Date Value  . INR 06/21/2011 3.5   Anti-coag visit on 05/31/2011  Component Date Value    . INR 05/31/2011 1.6   Anti-coag visit on 05/10/2011  Component Date Value  . INR 05/10/2011 3.6   Anti-coag visit on 04/12/2011  Component Date Value  . INR 04/12/2011 3.0      Results for this Opt Visit:     Results for orders placed in visit on 05/31/11  POCT INR      Component Value Range   INR 1.6      EKG Orders placed in visit on 12/15/10  . EKG 12-LEAD     Prior Assessment and Plan Problem List as of 06/21/2011          Cardiology Problems   HYPERLIPIDEMIA   Last Assessment & Plan Note   12/08/2010 Office Visit Signed 01/08/2011  2:19 PM by Kerri Perches, MD    Uncontrolled additional med added    MIGRAINE HEADACHE   Last Assessment & Plan Note   08/05/2010 Office Visit Signed 08/20/2010  5:14 PM by Syliva Overman, MD    pt    HYPERTENSION   Last Assessment & Plan Note   03/10/2011 Office Visit Signed 03/13/2011 11:17 AM by Kerri Perches, MD    Controlled, no change in medication     Pulmonary embolism   Last Assessment & Plan Note   03/10/2011 Office Visit Signed 03/13/2011 11:16 AM by Kerri Perches, MD    Chronic anti coagulation through coumadin clinic      Other   DIABETES MELLITUS   Last Assessment & Plan Note   03/10/2011 Office Visit Signed 03/13/2011 11:18 AM by Kerri Perches, MD    Controlled, no change in medication     OVERWEIGHT/OBESITY   Last Assessment & Plan Note   03/10/2011 Office Visit Addendum 03/13/2011 11:29 AM by Kerri Perches, MD    Patient re-educated about  the importance of commitment to a  minimum of 150 minutes of exercise per week. The importance of healthy food choices with portion control discussed. Encouraged to start a food diary, count calories and to consider  joining a support [name]. Sample diet sheets offered. Goals set by the patient for the next several months.       GERD   Last Assessment & Plan Note   10/28/2010 Office Visit Signed 11/01/2010 **Note De-Identified  Obfuscation** 10:08 PM by Nira Retort, NP    Breakthrough  reflux despite Omeprazole 40 mg daily. Diet/obesity likely playing significant role. Will switch to Nexium, provide GERD diet. Vague dysphagia with steak. Proceeding with EGD due to likely IDA.     OSTEOARTHRITIS, KNEE, LEFT   Last Assessment & Plan Note   03/10/2011 Office Visit Signed 03/13/2011 11:17 AM by Kerri Perches, MD    Deteriorating, is considering surgery in the Spring    INTERMITTENT VERTIGO   Last Assessment & Plan Note   03/10/2011 Office Visit Signed 03/13/2011 11:20 AM by Kerri Perches, MD    Uncontrolled, updated labs in Feb, continue current med    Insomnia   Last Assessment & Plan Note   07/10/2010 Office Visit Signed 07/17/2010  5:01 PM by Syliva Overman, MD    Sleep hygiene discussed, pt already practicing good hygiene, restoril to be started    Chronic anticoagulation   Last Assessment & Plan Note   09/23/2010 Office Visit Addendum 10/01/2010  8:59 PM by Kathlen Brunswick, MD    INRs have been consistently therapeutic or supratherapeutic.  Since patient cannot afford a co-pay for Coumadin clinic, she will obtain INRs at [facility] with the results transmitted to our office for adjustment of warfarin dosage.  Serial CBCs and stool for Hemoccult testing will be obtained to exclude occult GI blood loss.  Patient was scheduled for colonoscopy around the time of her pulmonary embolism.  We will defer that test for one year.  I will see this nice woman again in 8 months.    Iron deficiency anemia   Last Assessment & Plan Note   12/17/2010 Office Visit Signed 12/17/2010  4:24 PM by [place] Bali, MD    MOST LIKELY 2o TO COLON POLYPS/GASTRITIS. Doubt colon ca or gastric ca.  FOLLOW UP in 6 mos. COLONOSCOPY AND UPPER ENDOSCOPY IN NOV 2012. HOLD IRON FOR 7 DAYS. Continue metformin. Stop coumadin 5 days before procedure. Start Lovenox 150 MG SQ QD- 3 days prior to procedure. HOLD LOVENOX ON DAY OF PROCEDURE. Hematology referral to assess need for chronic Coumadin.     Anemia   Last Assessment & Plan Note   12/17/2010 Office Visit Addendum 12/17/2010  2:50 PM by [place] Bali, MD    FeDA-differential diagnosis include colon polyps, less likey colon ca or AVMs.  OPV in 6 mos. TCS/EGD/?ED-MOVIPREP  HOLD IRON FOR 7 DAYS. Continue metformin. Stop coumadin 5 days before procedure. Start Lovenox 1.5 mg/kg qd- 3 days prior to procedure. HOLD LOVENOX ON DAY OF PROCEDURE. Hematology referral to assess need for chronic Coumadin.    Chest pain   Cystitis, acute   Last Assessment & Plan Note   03/10/2011 Office Visit Signed 03/13/2011 11:22 AM by Kerri Perches, MD    Abnormal CCUA, and symptomatic mildly, will wait on c/s result        Imaging: No results found.   FRS Calculation: Score not calculated. Missing: Total Cholesterol

## 2011-06-21 NOTE — Patient Instructions (Signed)
**Note De-Identified  Obfuscation** Call this office at [phone] to schedule an appointment to have your Coumadin checked 5 days before surgery.  Your physician recommends that you complete 3 hemoccult cards and return to this office.  Your physician recommends that you schedule a follow-up appointment in: 1 year

## 2011-07-15 ENCOUNTER — Encounter: Payer: Self-pay | Admitting: *Deleted

## 2011-07-16 ENCOUNTER — Encounter: Payer: Self-pay | Admitting: Gastroenterology

## 2011-07-19 ENCOUNTER — Ambulatory Visit (INDEPENDENT_AMBULATORY_CARE_PROVIDER_SITE_OTHER): Payer: Medicare Other | Admitting: *Deleted

## 2011-07-19 DIAGNOSIS — I2699 Other pulmonary embolism without acute cor pulmonale: Secondary | ICD-10-CM

## 2011-07-19 DIAGNOSIS — Z7901 Long term (current) use of anticoagulants: Secondary | ICD-10-CM

## 2011-07-19 LAB — POCT INR: INR: 3.1

## 2011-07-20 ENCOUNTER — Encounter (INDEPENDENT_AMBULATORY_CARE_PROVIDER_SITE_OTHER): Payer: Medicare Other

## 2011-07-20 DIAGNOSIS — Z7901 Long term (current) use of anticoagulants: Secondary | ICD-10-CM

## 2011-07-28 ENCOUNTER — Other Ambulatory Visit: Payer: Self-pay

## 2011-07-28 ENCOUNTER — Encounter: Payer: Self-pay | Admitting: Cardiology

## 2011-07-28 DIAGNOSIS — I10 Essential (primary) hypertension: Secondary | ICD-10-CM

## 2011-07-28 DIAGNOSIS — E785 Hyperlipidemia, unspecified: Secondary | ICD-10-CM

## 2011-07-28 DIAGNOSIS — R5381 Other malaise: Secondary | ICD-10-CM

## 2011-07-28 DIAGNOSIS — D509 Iron deficiency anemia, unspecified: Secondary | ICD-10-CM | POA: Insufficient documentation

## 2011-07-28 DIAGNOSIS — R5383 Other fatigue: Secondary | ICD-10-CM

## 2011-07-28 DIAGNOSIS — E663 Overweight: Secondary | ICD-10-CM

## 2011-07-28 DIAGNOSIS — E119 Type 2 diabetes mellitus without complications: Secondary | ICD-10-CM

## 2011-07-28 DIAGNOSIS — Z7901 Long term (current) use of anticoagulants: Secondary | ICD-10-CM

## 2011-07-29 ENCOUNTER — Encounter: Payer: Self-pay | Admitting: General Practice

## 2011-07-29 NOTE — Progress Notes (Signed)
D/C letter mailed. 

## 2011-07-30 ENCOUNTER — Telehealth: Payer: Self-pay

## 2011-07-30 NOTE — Telephone Encounter (Signed)
Telephone contact noted. After discussion with office manager, I recommend letting patient discharge stand as is.

## 2011-07-30 NOTE — Telephone Encounter (Signed)
Pt's daughter is aware of recommendations from [facility] and was ok with that and will let mother know

## 2011-07-30 NOTE — Telephone Encounter (Signed)
LMOM to Call back

## 2011-07-30 NOTE — Telephone Encounter (Signed)
Pt's daughter(Kaylyn) called this morning wanting to talk to whom ever called her mother Thursday. I told her that I did not know but I would find out and have them call her back at [phone]. She stated that before her procedure she wanted [facility] but when she got there [facility] showed up and what do you do if you have already done the prep. She was not happy with the work [facility] did and has not been right since her procedure. Pt had an appointment with [facility] on 12/17/10 before her procedure which was on 01/05/11. She was called to make a follow up appointment with [facility] but she stated that she did not want to come there, so [facility] has send her a D/C letter from the practice. She would like to have [facility] as her doctor now. Please advise

## 2011-08-02 ENCOUNTER — Other Ambulatory Visit: Payer: Self-pay | Admitting: Family Medicine

## 2011-08-02 ENCOUNTER — Telehealth: Payer: Self-pay

## 2011-08-02 DIAGNOSIS — R5381 Other malaise: Secondary | ICD-10-CM

## 2011-08-02 DIAGNOSIS — E119 Type 2 diabetes mellitus without complications: Secondary | ICD-10-CM

## 2011-08-02 DIAGNOSIS — I10 Essential (primary) hypertension: Secondary | ICD-10-CM

## 2011-08-02 DIAGNOSIS — D509 Iron deficiency anemia, unspecified: Secondary | ICD-10-CM

## 2011-08-02 DIAGNOSIS — E785 Hyperlipidemia, unspecified: Secondary | ICD-10-CM

## 2011-08-02 DIAGNOSIS — D649 Anemia, unspecified: Secondary | ICD-10-CM

## 2011-08-02 NOTE — Telephone Encounter (Signed)
Needed to print lab order for patient

## 2011-08-03 LAB — CBC WITH DIFFERENTIAL/PLATELET
Basophils Absolute: 0 10*3/uL (ref 0.0–0.1)
Basophils Relative: 0 % (ref 0–1)
Hemoglobin: 10.7 g/dL — ABNORMAL LOW (ref 12.0–15.0)
MCHC: 32 g/dL (ref 30.0–36.0)
Neutro Abs: 4 10*3/uL (ref 1.7–7.7)
Neutrophils Relative %: 67 % (ref 43–77)
RDW: 19.3 % — ABNORMAL HIGH (ref 11.5–15.5)

## 2011-08-03 LAB — COMPLETE METABOLIC PANEL WITH GFR
AST: 15 U/L (ref 0–37)
Albumin: 4.2 g/dL (ref 3.5–5.2)
BUN: 23 mg/dL (ref 6–23)
CO2: 27 mEq/L (ref 19–32)
Calcium: 10.4 mg/dL (ref 8.4–10.5)
Chloride: 106 mEq/L (ref 96–112)
GFR, Est African American: 60 mL/min
Potassium: 4 mEq/L (ref 3.5–5.3)

## 2011-08-03 LAB — HEMOGLOBIN A1C: Hgb A1c MFr Bld: 6.2 % — ABNORMAL HIGH (ref <5.7)

## 2011-08-03 LAB — LIPID PANEL
HDL: 64 mg/dL (ref >39)
LDL Cholesterol: 68 mg/dL (ref 0–99)
Triglycerides: 122 mg/dL (ref <150)
VLDL: 24 mg/dL (ref 0–40)

## 2011-08-05 ENCOUNTER — Encounter: Payer: Self-pay | Admitting: Family Medicine

## 2011-08-05 ENCOUNTER — Ambulatory Visit (INDEPENDENT_AMBULATORY_CARE_PROVIDER_SITE_OTHER): Payer: Medicare Other | Admitting: Family Medicine

## 2011-08-05 VITALS — BP 110/70 | HR 76 | Resp 16 | Ht 65.0 in | Wt 215.1 lb

## 2011-08-05 DIAGNOSIS — I10 Essential (primary) hypertension: Secondary | ICD-10-CM

## 2011-08-05 DIAGNOSIS — M1710 Unilateral primary osteoarthritis, unspecified knee: Secondary | ICD-10-CM

## 2011-08-05 DIAGNOSIS — K219 Gastro-esophageal reflux disease without esophagitis: Secondary | ICD-10-CM

## 2011-08-05 DIAGNOSIS — G4700 Insomnia, unspecified: Secondary | ICD-10-CM

## 2011-08-05 DIAGNOSIS — E119 Type 2 diabetes mellitus without complications: Secondary | ICD-10-CM

## 2011-08-05 DIAGNOSIS — E785 Hyperlipidemia, unspecified: Secondary | ICD-10-CM

## 2011-08-05 MED ORDER — FENTANYL 25 MCG/HR TD PT72: 1.0000 | MEDICATED_PATCH | TRANSDERMAL | Status: DC

## 2011-08-05 NOTE — Assessment & Plan Note (Signed)
Hyperlipidemia:Low fat diet discussed and encouraged.  Controlled, no change in medication   

## 2011-08-05 NOTE — Patient Instructions (Addendum)
F/u in 4.5 month   Your labs and blood pressure are excellent, heart and lungs sound good  All; the best with your surgery.  Dr Karilyn Cota will see you about the intestinal bleeding, next Monday 6/24 at 9am  You will be referred to [facility] for evaluation by social worker for any assistance you can get at home  New for pain is the fentanyl patch use one patch every 3 days, you also need to sign a pain contract. I do jknow that the orthopedic surgeon will need to put you on additional surgery post operatively

## 2011-08-05 NOTE — Assessment & Plan Note (Signed)
Controlled, no change in medication  

## 2011-08-06 DIAGNOSIS — G4700 Insomnia, unspecified: Secondary | ICD-10-CM | POA: Insufficient documentation

## 2011-08-06 MED ORDER — TEMAZEPAM 15 MG PO CAPS: ORAL_CAPSULE | ORAL | Status: DC

## 2011-08-06 MED ORDER — OMEPRAZOLE 20 MG PO CPDR: 20.0000 mg | DELAYED_RELEASE_CAPSULE | Freq: Two times a day (BID) | ORAL | Status: DC

## 2011-08-08 NOTE — Assessment & Plan Note (Signed)
Uncontrolled pain will start fentanyl patch

## 2011-08-08 NOTE — Progress Notes (Signed)
  Subjective:    Patient ID: [name], female    DOB: [birth year], 71 y.o.   MRN: [id number]  HPI Pt is in for f/u of her chronic problems and is also scheduled for left knee replacement early in July. Pt has been having repeated episodes of gI blood loss source undetermined as of this time.She has had both upper and lower endo, is due for capsule study, and will now have a new GI doc continue to follow her for the problem' She has been increasingly depressed due to her inability to safely ambulate, movement out of the house is restricted, and her ability to care for herself less. Spouse has been very supportive, his health is far from good, and her sister is also now assisting. She is very open to eval and any help she can get through [facility], she needs it Denies any recent fever , chills, head or chest congestion, or dysuria. Stopped the anti depressants, said they made her "feel funny"   Review of Systems See HPI Denies recent fever or chills. Denies sinus pressure, nasal congestion, ear pain or sore throat. Denies chest congestion, productive cough or wheezing. Denies chest pains, palpitations and leg swelling Denies abdominal pain, nausea, vomiting,diarrhea or constipation.   Denies dysuria, frequency, hesitancy or incontinence. . Denies headaches, seizures, numbness, or tingling. Denies uncontrolled  depression, anxiety or insomnia. Denies skin break down or rash.        Objective:   Physical Exam Patient alert and oriented and in no cardiopulmonary distress.  HEENT: No facial asymmetry, EOMI, no sinus tenderness,  oropharynx pink and moist.  Neck supple no adenopathy.  Chest: Clear to auscultation bilaterally.  CVS: S1, S2 no murmurs, no S3.  ABD: Soft non tender. Bowel sounds normal.  Ext: No edema  MS: decreased  ROM spine,  and knees.  Skin: Intact, no ulcerations or rash noted.  Psych: Good eye contact, normal affect. Memory intact not anxious or depressed  appearing.  CNS: CN 2-12 intact, power, tone and sensation normal throughout.        Assessment & Plan:

## 2011-08-09 ENCOUNTER — Ambulatory Visit (INDEPENDENT_AMBULATORY_CARE_PROVIDER_SITE_OTHER): Payer: Medicare Other | Admitting: Internal Medicine

## 2011-08-09 ENCOUNTER — Encounter (HOSPITAL_COMMUNITY): Payer: Self-pay | Admitting: Pharmacy Technician

## 2011-08-09 ENCOUNTER — Encounter (INDEPENDENT_AMBULATORY_CARE_PROVIDER_SITE_OTHER): Payer: Self-pay | Admitting: *Deleted

## 2011-08-09 ENCOUNTER — Encounter (INDEPENDENT_AMBULATORY_CARE_PROVIDER_SITE_OTHER): Payer: Self-pay | Admitting: Internal Medicine

## 2011-08-09 ENCOUNTER — Other Ambulatory Visit (INDEPENDENT_AMBULATORY_CARE_PROVIDER_SITE_OTHER): Payer: Self-pay | Admitting: *Deleted

## 2011-08-09 VITALS — BP 130/70 | HR 76 | Temp 98.1°F | Resp 20 | Ht 67.0 in | Wt 214.4 lb

## 2011-08-09 DIAGNOSIS — D509 Iron deficiency anemia, unspecified: Secondary | ICD-10-CM

## 2011-08-09 DIAGNOSIS — K5900 Constipation, unspecified: Secondary | ICD-10-CM

## 2011-08-09 MED ORDER — POLYETHYLENE GLYCOL 3350 17 GM/SCOOP PO POWD: 17.0000 g | Freq: Every day | ORAL | Status: DC

## 2011-08-09 NOTE — Consult Note (Signed)
Reason for consultation;  Iron deficiency anemia and heme-positive stool.  History of present illness;  Patient is 70-year-old African female who is referred through courtesy of Dr. Margaret Simpson for GI evaluation.  Patient was noted to have iron deficiency anemia and heme-positive stool back in may 2012 and she was admitted with bilateral pulmonary emboli and DVT.  She was evaluated in September 2012 by Ms. Anna WM's NP/Dr. Fields. She was also complaining of solid food dysphagia at bedtime. She underwent esophagogastroduodenoscopy with esophageal dilation and colonoscopy on 01/05/2011.  Her esophagus was dilated to 16 mm with Savary dilator. She had gastritis but biopsy was negative for H. Pylori.  Colonoscopy revealed small lipoma at ascending colon. She had 2 small polyps ablated via cold biopsy one from ascending colon second one from descending colon and both of these are acute or adenomas. She had mild sigmoid diverticulosis and internal hemorrhoids. I see no bleeding lesion was found to account for and deficiency anemia given capsule study was advised that she wanted to wait and they are pending bills first. In the meantime she's been discharged from the practice.  Earlier this month she she again was noted to have heme positive stool and her hemoglobin is 10.7 g. Therefore Dr. Simpson requested further evaluation.  Today patient is accompanied by her sister [name].  Patient states she has good appetite but she is trying to lose weight in preparation for left knee replacement which is scheduled for 08/25/2011. She has lost 6 pounds in the last few weeks. She has intermittent heartburn no more than once or twice a week. She continues to complain of dysphagia which is primarily with steak and not progressive. She denies abdominal pain melena or rectal bleeding. Her stool is been dark since he's been on iron. She also complains of constipation. She complains of unrelenting left knee  pain. She was begun on Duragesic patch by Dr. Simpson which she plays yesterday and has noted some itching. She denies dysuria or hematuria.  Review of the systems is positive for intermittent vertigo felt to be secondary to inner ear problems and intermittent headache. She has migraine.  Patient was taking indomethacin but it has been discontinued. She does not take other OTC NSAIDs.  Current medications;  Current Outpatient Prescriptions on File Prior to Visit   Medication  Sig  Dispense  Refill   .  amLODipine (NORVASC) 10 MG tablet  Take 1 tablet (10 mg total) by mouth daily.  90 tablet  1   .  Blood Glucose Monitoring Suppl (ACCU-CHEK AVIVA PLUS) W/DEVICE KIT  1 kit by Does not apply route daily. Once daily testing  250.00  1 kit  0   .  fentaNYL (DURAGESIC - DOSED MCG/HR) 25 MCG/HR  Place 1 patch (25 mcg total) onto the skin every 3 (three) days.  10 patch  0   .  Fiber POWD  Take by mouth.     .  furosemide (LASIX) 40 MG tablet  Take one half to one tablet by mouth once daily as needed  90 tablet  1   .  imipramine (TOFRANIL) 50 MG tablet  Take 1 tablet (50 mg total) by mouth at bedtime. Take 2 tablets by mouth  30 tablet  0   .  latanoprost (XALATAN) 0.005 % ophthalmic solution  1 drop daily.     .  losartan-hydrochlorothiazide (HYZAAR) 50-12.5 MG per tablet  Take 1 tablet by mouth daily.  90 tablet  1   .    metFORMIN (GLUCOPHAGE) 500 MG tablet  Take 1 tablet (500 mg total) by mouth 2 (two) times daily with a meal.  180 tablet  1   .  omeprazole (PRILOSEC) 20 MG capsule  Take 1 capsule (20 mg total) by mouth 2 (two) times daily.  60 capsule  5   .  potassium gluconate 595 MG TABS  Take 595 mg by mouth daily.     .  pravastatin (PRAVACHOL) 20 MG tablet  Take 1 tablet (20 mg total) by mouth every evening.  90 tablet  1   .  temazepam (RESTORIL) 15 MG capsule  One to two capsules at bedtime , as needed , for sleep.  New directions effective 08/06/2011  60 capsule  5   .  topiramate (TOPAMAX)  100 MG tablet  Take 1 tablet (100 mg total) by mouth 2 (two) times daily.  180 tablet  1   .  warfarin (COUMADIN) 7.5 MG tablet  Take 7.5 mg by mouth as directed. Monday, Wednesday,Firday take 7.5 mg then on  Sunday, Tuesday, Thursday, Saturday take 3.75 mg     .  propranolol (INDERAL) 80 MG tablet  Take 1 tablet (80 mg total) by mouth daily.  90 tablet  1    Past medical history;  Hypertension of several years duration.  Diabetes mellitus  Chronic GERD.  History of migraine.  Bilateral glaucoma.  Hyperlipidemia.  History of labyrinthitis.  Insomnia.  Obesity.  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  History of iron deficiency anemia as above.  EGD and colonoscopy November 2012 as above.  Tonsillectomy several years ago.  Left knee arthroscopy.  Severe osteoarthrosis of left knee; knee replacement scheduled for 08/25/2011.  Right ankle surgery in 1997 for tendinitis.  Left ankle surgery for fracture 1997  Allergy;  Codeine.  Sulfa.  Family history;  Father had multiple medical problems and died at age 62.  Mother had end-stage renal disease requiring hemodialysis but lived to be 86.  One sister died at age 28. She suffered pulmonary embolism after she had fracture to her leg.  4 brothers and 5 sisters living. They're all hypertensive. 2 brothers are on hemodialysis.  Social history;  She is married. She has 2 biologic and 3 stepchildren.  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  Physical examination;  BP 130/70  Pulse 76  Temp 98.1 F (36.7 C) (Oral)  Resp 20  Ht 5' 7" (1.702 m)  Wt 214 lb 6.4 oz (97.251 kg)  BMI 33.58 kg/m2  Patient is wheelchair bound.  She is alert and in no acute distress.  Conjunctiva is pink. Sclera is nonicteric. Oropharyngeal mucosa is normal. She has upper partial plate.  No neck masses or thyromegaly noted.  Cardiac exam with regular rhythm normal S1 and S2. No murmur or gallop noted.  Lungs  are clear to auscultation.  Abdomen is full. Bowel sounds are normal. On palpation soft and nontender without organomegaly or masses.  No LE edema clubbing or clubbing he can noted.  Lab data;  CBC from 08/03/2011 WBC 6.0, H&H 10.7 and 33.4, MCV 80.3 and platelet count 345K.  3 Hemoccults done on 07/28/2011 by Dr. Simpson and ALT fever positive.  Iron studies done on 11/04/2010 confirming iron deficiency anemia.  Assessment;  [name] is a 70-year-old African female with multiple medical problems who is chronically anticoagulated because of history of DVT and pulmonary embolism. She was   found to have iron deficiency anemia and heme-positive stools last year and underwent EGD and colonoscopy by Dr. Fields in November 2012 and no lesion was found to account for blood loss. Patient was advised to undergo given capsule study but elected to wait.  Patient does not have any GI symptoms other than intermittent heartburn and constipation (felt to be secondary to her medications). Now that her stool is still positive she is interested in further workup. It is reassuring to know that her H&H is coming up though.  She possibly has small bowel AV malformations or even NSAID and use injury but she is not taking and the medicine anymore.  Recommendations;  Patient advised to hold off iron therapy for now.  Will schedule her for small bowel given capsule study on 08/18/2011 so that her evaluation can be completed prior to planned left knee replacement.  MiraLax 17 g by mouth daily for constipation.  We appreciate the opportunity to participate in the care of Ms. Broyhill.     

## 2011-08-09 NOTE — Patient Instructions (Signed)
Please do not take iron pills until small bowel given capsule study completed. New medication is polyethylene glycol or MiraLax half to one scoop daily for constipation.

## 2011-08-12 ENCOUNTER — Telehealth: Payer: Self-pay | Admitting: Family Medicine

## 2011-08-12 NOTE — Telephone Encounter (Signed)
If she can tolerate increase benadryl to 3 times daily  Please ensure not generalized itching if so, remove the patch,and stop, that wouold be an allergic reaction, if the itching is just where the patch is try inc dose of benadryl

## 2011-08-12 NOTE — Progress Notes (Signed)
Reason for consultation;  Iron deficiency anemia and heme-positive stool.  History of present illness;  Patient is 71 year old African female who is referred through courtesy of Dr. Syliva Overman for GI evaluation.  Patient was noted to have iron deficiency anemia and heme-positive stool back in may 2012 and she was admitted with bilateral pulmonary emboli and DVT.  She was evaluated in September 2012 by Ms. Tobi Bastos [facility] NP/Dr. Darrick Penna. She was also complaining of solid food dysphagia at bedtime. She underwent esophagogastroduodenoscopy with esophageal dilation and colonoscopy on 01/05/2011.  Her esophagus was dilated to 16 mm with Savary dilator. She had gastritis but biopsy was negative for H. Pylori.  Colonoscopy revealed small lipoma at ascending colon. She had 2 small polyps ablated via cold biopsy one from ascending colon second one from descending colon and both of these are acute or adenomas. She had mild sigmoid diverticulosis and internal hemorrhoids. I see no bleeding lesion was found to account for and deficiency anemia given capsule study was advised that she wanted to wait and they are pending bills first. In the meantime she's been discharged from the practice.  Earlier this month she she again was noted to have heme positive stool and her hemoglobin is 10.7 g. Therefore Dr. Lodema Hong requested further evaluation.  Today patient is accompanied by her sister Ms. Kerrin Champagne.  Patient states she has good appetite but she is trying to lose weight in preparation for left knee replacement which is scheduled for 08/25/2011. She has lost 6 pounds in the last few weeks. She has intermittent heartburn no more than once or twice a week. She continues to complain of dysphagia which is primarily with steak and not progressive. She denies abdominal pain melena or rectal bleeding. Her stool is been dark since he's been on iron. She also complains of constipation. She complains of unrelenting left knee  pain. She was begun on Duragesic patch by Dr. Lodema Hong which she plays yesterday and has noted some itching. She denies dysuria or hematuria.  Review of the systems is positive for intermittent vertigo felt to be secondary to inner ear problems and intermittent headache. She has migraine.  Patient was taking indomethacin but it has been discontinued. She does not take other OTC NSAIDs.  Current medications;  Current Outpatient Prescriptions on File Prior to Visit   Medication  Sig  Dispense  Refill   .  amLODipine (NORVASC) 10 MG tablet  Take 1 tablet (10 mg total) by mouth daily.  90 tablet  1   .  Blood Glucose Monitoring Suppl (ACCU-CHEK AVIVA PLUS) W/DEVICE KIT  1 kit by Does not apply route daily. Once daily testing  250.00  1 kit  0   .  fentaNYL (DURAGESIC - DOSED MCG/HR) 25 MCG/HR  Place 1 patch (25 mcg total) onto the skin every 3 (three) days.  10 patch  0   .  Fiber POWD  Take by mouth.     .  furosemide (LASIX) 40 MG tablet  Take one half to one tablet by mouth once daily as needed  90 tablet  1   .  imipramine (TOFRANIL) 50 MG tablet  Take 1 tablet (50 mg total) by mouth at bedtime. Take 2 tablets by mouth  30 tablet  0   .  latanoprost (XALATAN) 0.005 % ophthalmic solution  1 drop daily.     Marland Kitchen  losartan-hydrochlorothiazide (HYZAAR) 50-12.5 MG per tablet  Take 1 tablet by mouth daily.  90 tablet  1   .  metFORMIN (GLUCOPHAGE) 500 MG tablet  Take 1 tablet (500 mg total) by mouth 2 (two) times daily with a meal.  180 tablet  1   .  omeprazole (PRILOSEC) 20 MG capsule  Take 1 capsule (20 mg total) by mouth 2 (two) times daily.  60 capsule  5   .  potassium gluconate 595 MG TABS  Take 595 mg by mouth daily.     .  pravastatin (PRAVACHOL) 20 MG tablet  Take 1 tablet (20 mg total) by mouth every evening.  90 tablet  1   .  temazepam (RESTORIL) 15 MG capsule  One to two capsules at bedtime , as needed , for sleep.  New directions effective 08/06/2011  60 capsule  5   .  topiramate (TOPAMAX)  100 MG tablet  Take 1 tablet (100 mg total) by mouth 2 (two) times daily.  180 tablet  1   .  warfarin (COUMADIN) 7.5 MG tablet  Take 7.5 mg by mouth as directed. Monday, Wednesday,Firday take 7.5 mg then on  Sunday, Tuesday, Thursday, Saturday take 3.75 mg     .  propranolol (INDERAL) 80 MG tablet  Take 1 tablet (80 mg total) by mouth daily.  90 tablet  1    Past medical history;  Hypertension of several years duration.  Diabetes mellitus  Chronic GERD.  History of migraine.  Bilateral glaucoma.  Hyperlipidemia.  History of labyrinthitis.  Insomnia.  Obesity.  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  History of iron deficiency anemia as above.  EGD and colonoscopy November 2012 as above.  Tonsillectomy several years ago.  Left knee arthroscopy.  Severe osteoarthrosis of left knee; knee replacement scheduled for 08/25/2011.  Right ankle surgery in 1997 for tendinitis.  Left ankle surgery for fracture 1997  Allergy;  Codeine.  Sulfa.  Family history;  Father had multiple medical problems and died at age 23.  Mother had end-stage renal disease requiring hemodialysis but lived to be 56.  One sister died at age 34. She suffered pulmonary embolism after she had fracture to her leg.  4 brothers and 5 sisters living. They're all hypertensive. 2 brothers are on hemodialysis.  Social history;  She is married. She has 2 biologic and 3 stepchildren.  She worked in a [employer] for more than 30 years and also formed for few years.  Physical examination;  BP 130/70  Pulse 76  Temp 98.1 F (36.7 C) (Oral)  Resp 20  Ht 5\' 7"  (1.702 m)  Wt 214 lb 6.4 oz (97.251 kg)  BMI 33.58 kg/m2  Patient is wheelchair bound.  She is alert and in no acute distress.  Conjunctiva is pink. Sclera is nonicteric. Oropharyngeal mucosa is normal. She has upper partial plate.  No neck masses or thyromegaly noted.  Cardiac exam with regular rhythm normal S1 and S2. No murmur or gallop noted.  Lungs  are clear to auscultation.  Abdomen is full. Bowel sounds are normal. On palpation soft and nontender without organomegaly or masses.  No LE edema clubbing or clubbing he can noted.  Lab data;  CBC from 08/03/2011 WBC 6.0, H&H 10.7 and 33.4, MCV 80.3 and platelet count 345K.  3 Hemoccults done on 07/28/2011 by Dr. Lodema Hong and ALT fever positive.  Iron studies done on 11/04/2010 confirming iron deficiency anemia.  Assessment;  Ms. Ciano is a 71 year old African female with multiple medical problems who is chronically anticoagulated because of history of DVT and pulmonary embolism. She was  found to have iron deficiency anemia and heme-positive stools last year and underwent EGD and colonoscopy by Dr. Darrick Penna in November 2012 and no lesion was found to account for blood loss. Patient was advised to undergo given capsule study but elected to wait.  Patient does not have any GI symptoms other than intermittent heartburn and constipation (felt to be secondary to her medications). Now that her stool is still positive she is interested in further workup. It is reassuring to know that her H&H is coming up though.  She possibly has small bowel AV malformations or even NSAID and use injury but she is not taking and the medicine anymore.  Recommendations;  Patient advised to hold off iron therapy for now.  Will schedule her for small bowel given capsule study on 08/18/2011 so that her evaluation can be completed prior to planned left knee replacement.  MiraLax 17 g by mouth daily for constipation.  We appreciate the opportunity to participate in the care of Ms. Mcginnity.

## 2011-08-13 NOTE — Telephone Encounter (Signed)
Patient aware.

## 2011-08-13 NOTE — Telephone Encounter (Signed)
States generalized itching all over and also had a rash come up so she stopped using the patches- Advised possible allergic reaction to patches and not to use anymore and I would let you know to see if something else could be used

## 2011-08-13 NOTE — Telephone Encounter (Signed)
no other suggestion she is allergic to codeine, please let her know I am sorry no other suggestions

## 2011-08-16 ENCOUNTER — Ambulatory Visit (INDEPENDENT_AMBULATORY_CARE_PROVIDER_SITE_OTHER): Payer: Medicare Other | Admitting: *Deleted

## 2011-08-16 DIAGNOSIS — I2699 Other pulmonary embolism without acute cor pulmonale: Secondary | ICD-10-CM

## 2011-08-16 DIAGNOSIS — Z7901 Long term (current) use of anticoagulants: Secondary | ICD-10-CM

## 2011-08-16 LAB — POCT INR: INR: 2.7

## 2011-08-16 MED ORDER — ENOXAPARIN SODIUM 150 MG/ML ~~LOC~~ SOLN: 150.0000 mg | Freq: Every day | SUBCUTANEOUS | Status: DC

## 2011-08-16 NOTE — Patient Instructions (Addendum)
7/5 Last dose coumadin 7/8 Lovenox 150mg  SQ @ 9am 7/9 Lovenox 150mg  SQ @ 9am 7/10 No Lovenox,  Surgery F/U after hospital D/C by home health

## 2011-08-17 ENCOUNTER — Other Ambulatory Visit (HOSPITAL_COMMUNITY): Payer: Medicare Other

## 2011-08-18 ENCOUNTER — Ambulatory Visit (HOSPITAL_COMMUNITY)
Admission: RE | Admit: 2011-08-18 | Discharge: 2011-08-18 | Disposition: A | Discharge status: Home / Self Care | Payer: Medicare Other | Source: Ambulatory Visit | Attending: Internal Medicine | Admitting: Internal Medicine

## 2011-08-18 ENCOUNTER — Encounter (HOSPITAL_COMMUNITY)
Admission: RE | Disposition: A | Discharge status: Home / Self Care | Payer: Self-pay | Source: Ambulatory Visit | Attending: Internal Medicine

## 2011-08-18 DIAGNOSIS — I10 Essential (primary) hypertension: Secondary | ICD-10-CM | POA: Insufficient documentation

## 2011-08-18 DIAGNOSIS — E119 Type 2 diabetes mellitus without complications: Secondary | ICD-10-CM | POA: Insufficient documentation

## 2011-08-18 DIAGNOSIS — Z79899 Other long term (current) drug therapy: Secondary | ICD-10-CM | POA: Insufficient documentation

## 2011-08-18 DIAGNOSIS — D509 Iron deficiency anemia, unspecified: Secondary | ICD-10-CM | POA: Insufficient documentation

## 2011-08-18 DIAGNOSIS — Z7901 Long term (current) use of anticoagulants: Secondary | ICD-10-CM | POA: Insufficient documentation

## 2011-08-18 DIAGNOSIS — K319 Disease of stomach and duodenum, unspecified: Secondary | ICD-10-CM | POA: Insufficient documentation

## 2011-08-18 DIAGNOSIS — E785 Hyperlipidemia, unspecified: Secondary | ICD-10-CM | POA: Insufficient documentation

## 2011-08-18 HISTORY — PX: GIVENS CAPSULE STUDY: SHX5432

## 2011-08-18 SURGERY — IMAGING PROCEDURE, GI TRACT, INTRALUMINAL, VIA CAPSULE

## 2011-08-18 NOTE — H&P (Signed)
[name] is an 71 y.o. female.   Chief Complaint: Patient is here for given capsule study. HPI: Patient is 71 year old African female with iron deficiency anemia and GI bleeding negative EGD and colonoscopy. She is undergoing small bowel given capsule study. Details of her history can be found in my recent detailed office note.  Past Medical History  Diagnosis Date  . Atrial fibrillation   . Hyperlipidemia   . Hypertension   . Overweight   . Depression   . Diabetes mellitus   . Migraine   . Degenerative joint disease     Knees  . Gastroesophageal reflux disease   . Glaucoma   . Pulmonary embolism May 2012    acute presentation, bilateral PE  . Chronic anticoagulation 07/21/2010  . Anemia, iron deficiency 2012    Evaluated by Dr. Darrick Penna; H&H of 9.3/30.8 with MCV-79 in 10/2010; 3/3 positive Hemoccult cards in 07/2011    Past Surgical History  Procedure Date  . Tonsillectomy   . Dilation and curettage of uterus   . Urethral dilation   . Knee arthroscopy w/ meniscectomy 1970s    Left  . Colonoscopy Jan 2002; 2012    Dr. Maggie Schwalbe: ext. hemorrhoids, nl colon; 2012-adenomatous polyps, gastritis on EGD  . Upper gastrointestinal endoscopy     Family History  Problem Relation Age of Onset  . Diabetes Mother   . Hypertension Mother   . Arthritis Mother   . Heart failure Mother   . Leukemia Father   . Diabetes Sister   . Hypertension Sister   . Diabetes Brother   . Hypertension Brother   . Hypertension Brother   . Hypertension Brother   . Hypertension Brother   . Diabetes Brother   . Diabetes Brother   . Diabetes Brother   . Pulmonary embolism Sister   . Colon cancer Neg Hx   . Colon polyps Neg Hx    Social History:  reports that she has never smoked. She has never used smokeless tobacco. She reports that she does not drink alcohol or use illicit drugs.  Allergies:  Allergies  Allergen Reactions  . Codeine   . Fentanyl Itching    Patient just started the patch on  08-08-2011 and she has noted itching, but states that it is helping.  . Sulfonamide Derivatives     Medications Prior to Admission  Medication Sig Dispense Refill  . furosemide (LASIX) 40 MG tablet Take 20-40 mg by mouth daily as needed. Take one half to one tablet by mouth once daily as needed for fluid retention      . HYDROcodone-acetaminophen (NORCO) 5-325 MG per tablet Take 1 tablet by mouth every 6 (six) hours as needed. for severe pain      . imipramine (TOFRANIL) 50 MG tablet Take 100 mg by mouth at bedtime.       Marland Kitchen losartan-hydrochlorothiazide (HYZAAR) 50-12.5 MG per tablet Take 1 tablet by mouth daily.  90 tablet  1  . metFORMIN (GLUCOPHAGE) 500 MG tablet Take 1 tablet (500 mg total) by mouth 2 (two) times daily with a meal.  180 tablet  1  . omeprazole (PRILOSEC) 20 MG capsule Take 1 capsule (20 mg total) by mouth 2 (two) times daily.  60 capsule  5  . pravastatin (PRAVACHOL) 20 MG tablet Take 1 tablet (20 mg total) by mouth every evening.  90 tablet  1  . propranolol (INDERAL) 80 MG tablet Take 1 tablet (80 mg total) by mouth daily.  90 tablet  1  . temazepam (RESTORIL) 15 MG capsule Take 15-30 mg by mouth at bedtime. Take one or two capsules at bedtime for sleep New directions as of 08-06-11      . topiramate (TOPAMAX) 100 MG tablet Take 1 tablet (100 mg total) by mouth 2 (two) times daily.  180 tablet  1  . warfarin (COUMADIN) 7.5 MG tablet Take 3.75-7.5 mg by mouth as directed. Half a tablet everyday except Monday and Thursday take 1 tablet.      Marland Kitchen amLODipine (NORVASC) 10 MG tablet Take 1 tablet (10 mg total) by mouth daily.  90 tablet  1  . enoxaparin (LOVENOX) 150 MG/ML injection Inject 0.99 mLs (150 mg total) into the skin daily.  3 mL  1  . enoxaparin (LOVENOX) 150 MG/ML injection Inject 1 mg/kg into the skin daily.        Marland Kitchen enoxaparin (LOVENOX) 150 MG/ML injection Inject 1 mL (150 mg total) into the skin daily. Give at 9am  10 Syringe  1  . Fiber POWD Take 1 scoop by mouth  daily.       Marland Kitchen latanoprost (XALATAN) 0.005 % ophthalmic solution Place 1 drop into both eyes at bedtime.         Results for orders placed in visit on 08/16/11 (from the past 48 hour(s))  POCT INR     Status: Normal   Collection Time   08/16/11  3:15 PM      Component Value Range Comment   INR 2.7      No results found.  ROS  Height 5\' 4"  (1.626 m), weight 215 lb (97.523 kg). Physical Exam   Assessment/Plan Iron deficiency anemia. GI bleed of occult origin. Small bowel given capsule study  REHMAN,NAJEEB U 08/18/2011, 12:53 PM

## 2011-08-20 ENCOUNTER — Encounter (HOSPITAL_COMMUNITY)
Admission: RE | Admit: 2011-08-20 | Discharge: 2011-08-20 | Disposition: A | Discharge status: Home / Self Care | Payer: Medicare Other | Source: Ambulatory Visit | Attending: Orthopedic Surgery | Admitting: Orthopedic Surgery

## 2011-08-20 ENCOUNTER — Encounter (HOSPITAL_COMMUNITY)
Admission: RE | Admit: 2011-08-20 | Discharge: 2011-08-20 | Disposition: A | Discharge status: Home / Self Care | Payer: Medicare Other | Source: Ambulatory Visit | Attending: Surgery | Admitting: Surgery

## 2011-08-20 ENCOUNTER — Encounter (HOSPITAL_COMMUNITY): Payer: Self-pay

## 2011-08-20 ENCOUNTER — Encounter (HOSPITAL_COMMUNITY): Payer: Self-pay | Admitting: Pharmacy Technician

## 2011-08-20 HISTORY — DX: Personal history of colon polyps, unspecified: Z86.0100

## 2011-08-20 HISTORY — DX: Personal history of other diseases of the nervous system and sense organs: Z86.69

## 2011-08-20 HISTORY — DX: Other melanin hyperpigmentation: L81.4

## 2011-08-20 HISTORY — DX: Pneumonia, unspecified organism: J18.9

## 2011-08-20 HISTORY — DX: Nocturia: R35.1

## 2011-08-20 HISTORY — DX: Effusion, unspecified joint: M25.40

## 2011-08-20 HISTORY — DX: Personal history of colonic polyps: Z86.010

## 2011-08-20 HISTORY — DX: Dorsalgia, unspecified: M54.9

## 2011-08-20 HISTORY — DX: Other chronic pain: G89.29

## 2011-08-20 HISTORY — DX: Spontaneous ecchymoses: R23.3

## 2011-08-20 HISTORY — DX: Unspecified glaucoma: H40.9

## 2011-08-20 HISTORY — DX: Insomnia, unspecified: G47.00

## 2011-08-20 HISTORY — DX: Other skin changes: R23.8

## 2011-08-20 HISTORY — DX: Personal history of other medical treatment: Z92.89

## 2011-08-20 HISTORY — DX: Anxiety disorder, unspecified: F41.9

## 2011-08-20 HISTORY — DX: Pain in unspecified joint: M25.50

## 2011-08-20 HISTORY — DX: Personal history of other diseases of the musculoskeletal system and connective tissue: Z87.39

## 2011-08-20 HISTORY — DX: Frequency of micturition: R35.0

## 2011-08-20 LAB — SURGICAL PCR SCREEN
MRSA, PCR: NEGATIVE
Staphylococcus aureus: NEGATIVE

## 2011-08-20 LAB — URINALYSIS, ROUTINE W REFLEX MICROSCOPIC
Bilirubin Urine: NEGATIVE
Hgb urine dipstick: NEGATIVE
Nitrite: NEGATIVE
Specific Gravity, Urine: 1.018 (ref 1.005–1.030)
pH: 5.5 (ref 5.0–8.0)

## 2011-08-20 LAB — URINE MICROSCOPIC-ADD ON

## 2011-08-20 LAB — COMPREHENSIVE METABOLIC PANEL
Alkaline Phosphatase: 96 U/L (ref 39–117)
BUN: 17 mg/dL (ref 6–23)
CO2: 22 mEq/L (ref 19–32)
Chloride: 104 mEq/L (ref 96–112)
GFR calc Af Amer: 67 mL/min — ABNORMAL LOW (ref >90)
Glucose, Bld: 104 mg/dL — ABNORMAL HIGH (ref 70–99)
Potassium: 3.7 mEq/L (ref 3.5–5.1)
Total Bilirubin: 0.1 mg/dL — ABNORMAL LOW (ref 0.3–1.2)

## 2011-08-20 LAB — CBC
HCT: 35.7 % — ABNORMAL LOW (ref 36.0–46.0)
Hemoglobin: 11.5 g/dL — ABNORMAL LOW (ref 12.0–15.0)
WBC: 8.8 10*3/uL (ref 4.0–10.5)

## 2011-08-20 LAB — ABO/RH: ABO/RH(D): AB POS

## 2011-08-20 LAB — APTT: aPTT: 40 seconds — ABNORMAL HIGH (ref 24–37)

## 2011-08-20 LAB — PROTIME-INR: Prothrombin Time: 22.9 seconds — ABNORMAL HIGH (ref 11.6–15.2)

## 2011-08-20 NOTE — Op Note (Signed)
Small Bowel Givens Capsule Study Procedure date:  08/18/2011.  Referring Provider:  Dr. Syliva Overman, MD PCP:  Dr. Syliva Overman, MD  Indication for procedure:  GI bleed of occult origin. Patient is 71 year old female was iron deficiency anemia and heme-positive stools. She underwent EGD and colonoscopy in November 2012 but no lesion was found to account for her iron deficiency anemia and heme positive stools.  Patient data:  Wt: 214 pounds. Ht: 67 inch   Findings:  There is fresh blood coating post bulbar mucosa. There is small fresh clot at 24 minutes and 45 seconds probably free-floating. Coffee-ground material noted in distal small bowel.  First Gastric image:  47 seconds. First Duodenal image: 16 minutes and 14 seconds. First Ileo-Cecal Valve image: 4 hours 50 minutes and 29 seconds. First Cecal image: 4 hours 50 minutes and 39 seconds. Gastric Passage time:  15 m Small Bowel Passage time:  4 h 76m  Summary & Recommendations: Fresh blood noted just past the bulb. No bleeding lesion identified. Suspect she must have AV malformations or too low for lesion. Patient's last EGD was in November 2012 and will need to be repeated prior to her scheduled left knee replacement on 08/25/2011. Will coordinate with patient as she will have to be off her warfarin and is to be bridged with Lovenox.

## 2011-08-20 NOTE — Pre-Procedure Instructions (Signed)
[name]  08/20/2011   Your procedure is scheduled on:  Wed, July 10 @ 1:00 PM  Report to [facility] at 10:00 AM.  Call this number if you have problems the morning of surgery: [phone]   Remember:   Do not eat food:After Midnight.  Take these medicines the morning of surgery with A SIP OF WATER: Amlodipine(Norvasc),Pain Pill(if needed),Omeprazole(Prilosec),and Propranolol(Indernal)   Do not wear jewelry, make-up or nail polish.  Do not wear lotions, powders, or perfumes.   Do not shave 48 hours prior to surgery.   Do not bring valuables to the hospital.  Contacts, dentures or bridgework may not be worn into surgery.  Leave suitcase in the car. After surgery it may be brought to your room.  For patients admitted to the hospital, checkout time is 11:00 AM the day of discharge.   Patients discharged the day of surgery will not be allowed to drive home.  Special Instructions: CHG Shower Use Special Wash: 1/2 bottle night before surgery and 1/2 bottle morning of surgery.   Please read over the following fact sheets that you were given: Pain Booklet, Coughing and Deep Breathing, Blood Transfusion Information, Total Joint Packet, MRSA Information and Surgical Site Infection Prevention

## 2011-08-20 NOTE — Progress Notes (Addendum)
Dr.rothbart is cardiologist and report and clearance in epic  Stress test reports in epic from 2003/08/27 Echo in epic from 2012  Medical MD is Dr.Margaret Lodema Hong  EKG in epic from Oct 2012 No recent CXR

## 2011-08-23 ENCOUNTER — Encounter (HOSPITAL_COMMUNITY): Payer: Self-pay | Admitting: Vascular Surgery

## 2011-08-23 ENCOUNTER — Encounter (HOSPITAL_COMMUNITY): Payer: Self-pay | Admitting: Pharmacy Technician

## 2011-08-23 ENCOUNTER — Other Ambulatory Visit (INDEPENDENT_AMBULATORY_CARE_PROVIDER_SITE_OTHER): Payer: Self-pay | Admitting: *Deleted

## 2011-08-23 DIAGNOSIS — K922 Gastrointestinal hemorrhage, unspecified: Secondary | ICD-10-CM

## 2011-08-23 MED ORDER — SODIUM CHLORIDE 0.45 % IV SOLN
Freq: Once | INTRAVENOUS | Status: AC
  Administered 2011-08-24: 07:00:00 via INTRAVENOUS

## 2011-08-23 NOTE — Consult Note (Addendum)
Anesthesia Chart Review:  Patient is a 71 year old female scheduled for left TKR on 08/25/11 by Dr. Eulah Pont.  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 Lionel December.  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  An EGD was recommended pre-TKR and is scheduled for 08/24/11.    EKG on 12/08/10 showed SR with sinus arrythmia, minimal voltage criteria for LVH.  Echo on 09/21/10 showed: - Left ventricle: The cavity size was normal. There was mild focal basal hypertrophy of the septum. Systolic function was normal. The estimated ejection fraction was in the range of 55% to 60%. Wall motion was normal; there were no regional wall motion abnormalities. - Aortic valve: Mildly calcified annulus. Trileaflet; normal thickness, mildly calcified leaflets. Trivial regurgitation. Impressions: - Compared to the previous study performed 07/17/10, prior reading of moderate LVH not supported by present study; RV dilation and dysfunction not noted on the current exam. PA pressure not measured, but there is not evidence for significant pulmonary hypertension.  Her last stress test was done at [facility] on 05/11/10 and showed mild perfusion defect due to infarct/scar with mild peri-infarct ischemia seen in the basal inferolateral, mid inferolateral, and apical lateral regions.  No significant wall motion abnormalities, suggesting the absence of true infarction with the potential for artifact.  EF 73%.  According to Dr. Iantha Fallen Hilty's letter to Dr. Lodema Hong dated 07/27/10 (under Media tab), he felt the stress test findings were likely artifact and ultimately said that it was a low risk scan.  She has since been followed by Dr. Dietrich Pates at [facility].  He cleared her for this procedure from a  Cardiology standpoint, but recommended a Lovenox bridge while off Coumadin.  CXR on 08/20/11 showed stable appearance with no new worrisome focal or acute cardiopulmonary abnormality identified.  Labs noted.  H/H 11.5/35.7.  PLT 379.  PT/PTT elevated.  She will need these repeated pre-operatively.    I called and spoke with Silvestre Mesi at Dr. Greig Right office to report UA results and confirm that they were aware of GI plans for EGD after recent finding of fresh blood on SB capsule procedure.  She confirmed that they were aware and are actually planning to cancel her surgery for 08/25/11.   Shonna Chock, PA-C 08/23/11 [phone]

## 2011-08-24 ENCOUNTER — Encounter (HOSPITAL_COMMUNITY): Payer: Self-pay

## 2011-08-24 ENCOUNTER — Ambulatory Visit (HOSPITAL_COMMUNITY)
Admission: RE | Admit: 2011-08-24 | Discharge: 2011-08-24 | Disposition: A | Discharge status: Home / Self Care | Payer: Medicare Other | Source: Ambulatory Visit | Attending: Internal Medicine | Admitting: Internal Medicine

## 2011-08-24 ENCOUNTER — Encounter (HOSPITAL_COMMUNITY)
Admission: RE | Disposition: A | Discharge status: Home / Self Care | Payer: Self-pay | Source: Ambulatory Visit | Attending: Internal Medicine

## 2011-08-24 DIAGNOSIS — K264 Chronic or unspecified duodenal ulcer with hemorrhage: Secondary | ICD-10-CM

## 2011-08-24 DIAGNOSIS — I10 Essential (primary) hypertension: Secondary | ICD-10-CM | POA: Insufficient documentation

## 2011-08-24 DIAGNOSIS — K922 Gastrointestinal hemorrhage, unspecified: Secondary | ICD-10-CM

## 2011-08-24 DIAGNOSIS — E785 Hyperlipidemia, unspecified: Secondary | ICD-10-CM | POA: Insufficient documentation

## 2011-08-24 DIAGNOSIS — K2990 Gastroduodenitis, unspecified, without bleeding: Secondary | ICD-10-CM | POA: Insufficient documentation

## 2011-08-24 DIAGNOSIS — Z79899 Other long term (current) drug therapy: Secondary | ICD-10-CM | POA: Insufficient documentation

## 2011-08-24 DIAGNOSIS — K2970 Gastritis, unspecified, without bleeding: Secondary | ICD-10-CM | POA: Insufficient documentation

## 2011-08-24 DIAGNOSIS — Z01812 Encounter for preprocedural laboratory examination: Secondary | ICD-10-CM | POA: Insufficient documentation

## 2011-08-24 DIAGNOSIS — D509 Iron deficiency anemia, unspecified: Secondary | ICD-10-CM

## 2011-08-24 DIAGNOSIS — K319 Disease of stomach and duodenum, unspecified: Secondary | ICD-10-CM | POA: Insufficient documentation

## 2011-08-24 DIAGNOSIS — E119 Type 2 diabetes mellitus without complications: Secondary | ICD-10-CM | POA: Insufficient documentation

## 2011-08-24 DIAGNOSIS — K3182 Dieulafoy lesion (hemorrhagic) of stomach and duodenum: Secondary | ICD-10-CM

## 2011-08-24 HISTORY — PX: ESOPHAGOGASTRODUODENOSCOPY: SHX5428

## 2011-08-24 SURGERY — EGD (ESOPHAGOGASTRODUODENOSCOPY)
Anesthesia: Moderate Sedation

## 2011-08-24 MED ORDER — MEPERIDINE HCL 50 MG/ML IJ SOLN
INTRAMUSCULAR | Status: AC
  Filled 2011-08-24: qty 1

## 2011-08-24 MED ORDER — ENOXAPARIN SODIUM 150 MG/ML ~~LOC~~ SOLN: 150.0000 mg | Freq: Every day | SUBCUTANEOUS | Status: DC

## 2011-08-24 MED ORDER — SIMETHICONE 40 MG/0.6ML PO SUSP
ORAL | Status: DC | PRN
  Administered 2011-08-24: 08:00:00

## 2011-08-24 MED ORDER — MIDAZOLAM HCL 5 MG/5ML IJ SOLN
INTRAMUSCULAR | Status: DC | PRN
  Administered 2011-08-24 (×4): 2 mg via INTRAVENOUS

## 2011-08-24 MED ORDER — BUTAMBEN-TETRACAINE-BENZOCAINE 2-2-14 % EX AERO
INHALATION_SPRAY | CUTANEOUS | Status: DC | PRN
  Administered 2011-08-24: 2 via TOPICAL

## 2011-08-24 MED ORDER — MIDAZOLAM HCL 5 MG/5ML IJ SOLN
INTRAMUSCULAR | Status: AC
  Filled 2011-08-24: qty 10

## 2011-08-24 MED ORDER — HYDROCODONE-ACETAMINOPHEN 5-500 MG PO TABS: 1.0000 | ORAL_TABLET | Freq: Four times a day (QID) | ORAL | Status: AC | PRN

## 2011-08-24 MED ORDER — MEPERIDINE HCL 25 MG/ML IJ SOLN
INTRAMUSCULAR | Status: DC | PRN
  Administered 2011-08-24 (×2): 25 mg via INTRAVENOUS

## 2011-08-24 NOTE — H&P (Signed)
[name] is an 71 y.o. female.   Chief Complaint: Patient is here for esophagogastroduodenoscopy. HPI: Patient is 71 year old African female with multiple medical problems including history of. This is chronically anticoagulated. She was found to have iron deficiency anemia last year and EGD and colonoscopy. No definite bleeding lesion was found. She remains with anemia and heme-positive stools. Patient underwent small bowel given capsule study last week she had fresh blood in proximal duodenum.. she is therefore undergoing EGD with therapeutic intention. Patient's warfarin is on hold and she is encouraged with Lovenox. Patient was scheduled for knee replacement in a.m. but that has been postponed because of GI issues. View of the systems is negative for nausea vomiting anorexia weight loss or rectal bleeding.  Past Medical History  Diagnosis Date  . Hyperlipidemia     takes Pravastatin daily  . Overweight   . Depression   . Migraine   . Degenerative joint disease     Knees  . Glaucoma   . Pulmonary embolism May 2012    acute presentation, bilateral PE  . Chronic anticoagulation 07/21/2010  . Anemia, iron deficiency 2012    Evaluated by Dr. Darrick Penna; H&H of 9.3/30.8 with MCV-79 in 10/2010; 3/3 positive Hemoccult cards in 07/2011  . Atrial fibrillation     takes Coumadin daily  . Hypertension     takes Hyzaar daily and Propranolol   . Pneumonia 1969    hx of  . Hx of migraines     last one about a yr ago;takes Topamax daily  . Joint pain   . Joint swelling   . Chronic back pain     related to knee pain  . History of gout     was on medication but taken off of several months ago  . Bruises easily     takes Coumadin  . Skin spots-aging   . Gastroesophageal reflux disease     takes Omeprazole daily  . Hx of colonic polyps   . Urinary frequency   . Nocturia   . History of blood transfusion 1969  . Diabetes mellitus     takes Metformin daily  . Glaucoma     uses eye drops at  night  . Anxiety     was on Prozac for a couple of weeks  . Insomnia     takes Restoril prn    Past Surgical History  Procedure Date  . Tonsillectomy   . Dilation and curettage of uterus   . Urethral dilation   . Knee arthroscopy w/ meniscectomy 90's    Left  . Colonoscopy Jan 2002; 2012    Dr. Maggie Schwalbe: ext. hemorrhoids, nl colon; 2012-adenomatous polyps, gastritis on EGD  . Upper gastrointestinal endoscopy   . Ankle surgery 90's    right  . Ankle fracture surgery 90's  . Esophageal dilitation     Family History  Problem Relation Age of Onset  . Diabetes Mother   . Hypertension Mother   . Arthritis Mother   . Heart failure Mother   . Leukemia Father   . Diabetes Sister   . Hypertension Sister   . Diabetes Brother   . Hypertension Brother   . Hypertension Brother   . Hypertension Brother   . Hypertension Brother   . Diabetes Brother   . Diabetes Brother   . Diabetes Brother   . Pulmonary embolism Sister   . Colon cancer Neg Hx   . Colon polyps Neg Hx    Social  History:  reports that she has never smoked. She has never used smokeless tobacco. She reports that she does not drink alcohol or use illicit drugs.  Allergies:  Allergies  Allergen Reactions  . Fentanyl Itching    Patient just started the patch on 08-08-2011 and she has noted itching, but states that it is helping.  . Sulfonamide Derivatives Hives  . Codeine Itching and Rash    Medications Prior to Admission  Medication Sig Dispense Refill  . amLODipine (NORVASC) 10 MG tablet Take 1 tablet (10 mg total) by mouth daily.  90 tablet  1  . enoxaparin (LOVENOX) 150 MG/ML injection Inject 150 mg into the skin daily.      . Fiber POWD Take 1 scoop by mouth daily.       . furosemide (LASIX) 40 MG tablet Take 20-40 mg by mouth daily as needed. Take one half to one tablet by mouth once daily as needed for fluid retention      . HYDROcodone-acetaminophen (NORCO) 5-325 MG per tablet Take 1 tablet by mouth every  6 (six) hours as needed. for severe pain      . imipramine (TOFRANIL) 50 MG tablet Take 100 mg by mouth at bedtime.       Marland Kitchen latanoprost (XALATAN) 0.005 % ophthalmic solution Place 1 drop into both eyes at bedtime.       Marland Kitchen losartan-hydrochlorothiazide (HYZAAR) 50-12.5 MG per tablet Take 1 tablet by mouth daily.  90 tablet  1  . metFORMIN (GLUCOPHAGE) 500 MG tablet Take 1 tablet (500 mg total) by mouth 2 (two) times daily with a meal.  180 tablet  1  . omeprazole (PRILOSEC) 20 MG capsule Take 1 capsule (20 mg total) by mouth 2 (two) times daily.  60 capsule  5  . polyethylene glycol powder (GLYCOLAX/MIRALAX) powder Take 17 g by mouth as needed. For constipation      . pravastatin (PRAVACHOL) 20 MG tablet Take 1 tablet (20 mg total) by mouth every evening.  90 tablet  1  . propranolol (INDERAL) 80 MG tablet Take 1 tablet (80 mg total) by mouth daily.  90 tablet  1  . temazepam (RESTORIL) 15 MG capsule Take 15-30 mg by mouth at bedtime as needed. For sleep      . topiramate (TOPAMAX) 100 MG tablet Take 1 tablet (100 mg total) by mouth 2 (two) times daily.  180 tablet  1  . warfarin (COUMADIN) 7.5 MG tablet Take 3.75-7.5 mg by mouth as directed. Half a tablet everyday except Monday and Thursday take 1 tablet.        No results found for this or any previous visit (from the past 48 hour(s)). No results found.  ROS  Blood pressure 138/87, pulse 75, temperature 97.5 F (36.4 C), temperature source Oral, resp. rate 18, height 5\' 4"  (1.626 m), weight 214 lb (97.07 kg), SpO2 95.00%. Physical Exam  Constitutional: She appears well-developed and well-nourished.  HENT:  Mouth/Throat: Oropharynx is clear and moist.  Eyes: Conjunctivae are normal. No scleral icterus.  Neck: No thyromegaly present.  Cardiovascular: Normal rate, regular rhythm and normal heart sounds.   No murmur heard. Respiratory: Effort normal and breath sounds normal.  GI: Soft. She exhibits no mass. There is tenderness (mild  tenderness at LLQ).  Musculoskeletal: She exhibits no edema.  Lymphadenopathy:    She has no cervical adenopathy.  Neurological: She is alert.  Skin: Skin is warm and dry.     Assessment/Plan Iron deficiency anemia with  heme positive stools. Abnormal given capsule study that bleeding is from proximal duodenum. EGD with therapeutic intention.  Caylynn Minchew U 08/24/2011, 7:33 AM

## 2011-08-24 NOTE — Op Note (Signed)
EGD PROCEDURE REPORT  PATIENT:  [name]  MR#:  [id number] Birthdate:  Mar 18, 1940, 71 y.o., female Endoscopist:  Dr. Malissa Hippo, MD Referred By:  Dr. Syliva Overman, MD Procedure Date: 08/24/2011  Procedure:   EGD with coagulation of bleeding lesion at duodenum using Gold probe.  Indications:  Patient is 71 year old African female with iron deficiency anemia and recurrent GI bleed with negative EGD and colonoscopy last year. She underwent small bowel given capsule study last week and she had fresh blood in the proximal duodenum. She is therefore undergoing EGD with therapeutic intention.            Informed Consent:  The risks, benefits, alternatives & imponderables which include, but are not limited to, bleeding, infection, perforation, drug reaction and potential missed lesion have been reviewed.  The potential for biopsy, lesion removal, esophageal dilation, etc. have also been discussed.  Questions have been answered.  All parties agreeable.  Please see history & physical in medical record for more information.  Medications:  Demerol 50 mg IV Versed 8 mg IV Cetacaine spray topically for oropharyngeal anesthesia  Description of procedure:  The endoscope was introduced through the mouth and advanced to the second portion of the duodenum without difficulty or limitations. The mucosal surfaces were surveyed very carefully during advancement of the scope and upon withdrawal.  Findings:  Esophagus:  Mucosa of the esophagus was normal. Unremarkable GE junction. GEJ:  39 cm Stomach:  Stomach was empty and distended very well with insufflation. Folds in the proximal stomach were normal. Examination of mucosa revealed few areas with focal erythema at antrum and fundus. Angularis was unremarkable. Duodenum:  Bulbar mucosa was normal. In the second part of the duodenum there was focal raised area covered with speck of blood. 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 It was therefore  coagulated with Gold probe. No other lesion was noted at second and third part of the duodenum.  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 It was coagulated with Gold probe. Local gastritis at antrum and fundus.  Recommendations:  Patient will resume Lovenox this evening and stop after dose on 08/27/2011. She'll resume warfarin at usual dose today and get INR checked in 10 days. H&H in 2 weeks.  REHMAN,NAJEEB U  08/24/2011  8:10 AM  CC: Dr. Syliva Overman, MD & Dr. Bonnetta Barry ref. provider found

## 2011-08-25 ENCOUNTER — Ambulatory Visit (HOSPITAL_COMMUNITY): Admission: RE | Admit: 2011-08-25 | Payer: Medicare Other | Source: Ambulatory Visit | Admitting: Orthopedic Surgery

## 2011-08-25 ENCOUNTER — Ambulatory Visit: Payer: Medicare Other | Admitting: Gastroenterology

## 2011-08-25 ENCOUNTER — Encounter (HOSPITAL_COMMUNITY): Admission: RE | Payer: Self-pay | Source: Ambulatory Visit

## 2011-08-25 ENCOUNTER — Telehealth (INDEPENDENT_AMBULATORY_CARE_PROVIDER_SITE_OTHER): Payer: Self-pay | Admitting: *Deleted

## 2011-08-25 DIAGNOSIS — K260 Acute duodenal ulcer with hemorrhage: Secondary | ICD-10-CM

## 2011-08-25 DIAGNOSIS — D649 Anemia, unspecified: Secondary | ICD-10-CM

## 2011-08-25 SURGERY — ARTHROPLASTY, KNEE, TOTAL
Anesthesia: General | Laterality: Left

## 2011-08-25 NOTE — Telephone Encounter (Signed)
Per Dr.Rehman the patient will need to have H/H in 2 weeks. This has been noted for July 23

## 2011-08-26 ENCOUNTER — Encounter (HOSPITAL_COMMUNITY): Payer: Self-pay | Admitting: Internal Medicine

## 2011-09-10 ENCOUNTER — Telehealth (INDEPENDENT_AMBULATORY_CARE_PROVIDER_SITE_OTHER): Payer: Self-pay | Admitting: *Deleted

## 2011-09-10 DIAGNOSIS — D649 Anemia, unspecified: Secondary | ICD-10-CM

## 2011-09-10 NOTE — Telephone Encounter (Signed)
Per Dr.Rehman the patient will need to have H/H in three weeks. He sister is to come by and pick up a hemoccult card.

## 2011-09-15 ENCOUNTER — Other Ambulatory Visit (INDEPENDENT_AMBULATORY_CARE_PROVIDER_SITE_OTHER): Payer: Self-pay | Admitting: *Deleted

## 2011-09-15 ENCOUNTER — Encounter (INDEPENDENT_AMBULATORY_CARE_PROVIDER_SITE_OTHER): Payer: Self-pay | Admitting: *Deleted

## 2011-09-15 DIAGNOSIS — D649 Anemia, unspecified: Secondary | ICD-10-CM

## 2011-09-23 ENCOUNTER — Telehealth (INDEPENDENT_AMBULATORY_CARE_PROVIDER_SITE_OTHER): Payer: Self-pay | Admitting: *Deleted

## 2011-09-23 DIAGNOSIS — D649 Anemia, unspecified: Secondary | ICD-10-CM

## 2011-09-23 DIAGNOSIS — K921 Melena: Secondary | ICD-10-CM

## 2011-09-23 NOTE — Telephone Encounter (Signed)
Per Dr.Rehman on 09/18/11 , the patient will need to have H/H in 2 weeks, this would be around August 17 th . The lab order was sent to [facility] lab.

## 2011-09-28 ENCOUNTER — Encounter (INDEPENDENT_AMBULATORY_CARE_PROVIDER_SITE_OTHER): Payer: Self-pay

## 2011-09-29 ENCOUNTER — Ambulatory Visit (INDEPENDENT_AMBULATORY_CARE_PROVIDER_SITE_OTHER): Payer: Medicare Other | Admitting: *Deleted

## 2011-09-29 DIAGNOSIS — I2699 Other pulmonary embolism without acute cor pulmonale: Secondary | ICD-10-CM

## 2011-09-29 DIAGNOSIS — Z7901 Long term (current) use of anticoagulants: Secondary | ICD-10-CM

## 2011-10-05 ENCOUNTER — Encounter (HOSPITAL_COMMUNITY): Payer: Self-pay | Admitting: Pharmacy Technician

## 2011-10-05 LAB — HEMOGLOBIN AND HEMATOCRIT, BLOOD: HCT: 30 % — ABNORMAL LOW (ref 36.0–46.0)

## 2011-10-11 ENCOUNTER — Ambulatory Visit (INDEPENDENT_AMBULATORY_CARE_PROVIDER_SITE_OTHER): Payer: Medicare Other | Admitting: *Deleted

## 2011-10-11 DIAGNOSIS — Z7901 Long term (current) use of anticoagulants: Secondary | ICD-10-CM

## 2011-10-11 DIAGNOSIS — I2699 Other pulmonary embolism without acute cor pulmonale: Secondary | ICD-10-CM

## 2011-10-11 MED ORDER — ENOXAPARIN SODIUM 150 MG/ML ~~LOC~~ SOLN: 150.0000 mg | Freq: Every day | SUBCUTANEOUS | Status: DC

## 2011-10-11 NOTE — Patient Instructions (Addendum)
Instructions     8/26-8/29 follow regular coumadin dose schedule 8/29 last dose coumadin  8/30 no coumadin or lovenox 8/31 Lovenox 150mg  SQ @ 9am  9/1   Lovenox 150mg  SQ @ 9am  9/2  Lovenox 150mg  SQ @ 9am  9/3  Lovenox 150mg  SQ @ 9am  No Lovenox, Surgery  F/U after hospital D/C by home health

## 2011-10-12 ENCOUNTER — Encounter (HOSPITAL_COMMUNITY)
Admission: RE | Admit: 2011-10-12 | Discharge: 2011-10-12 | Disposition: A | Discharge status: Home / Self Care | Payer: Medicare Other | Source: Ambulatory Visit | Attending: Orthopedic Surgery | Admitting: Orthopedic Surgery

## 2011-10-12 ENCOUNTER — Encounter (HOSPITAL_COMMUNITY): Payer: Self-pay

## 2011-10-12 ENCOUNTER — Encounter (HOSPITAL_COMMUNITY): Payer: Self-pay | Admitting: Pharmacy Technician

## 2011-10-12 LAB — COMPREHENSIVE METABOLIC PANEL
BUN: 17 mg/dL (ref 6–23)
Calcium: 9.8 mg/dL (ref 8.4–10.5)
Creatinine, Ser: 1.04 mg/dL (ref 0.50–1.10)
GFR calc Af Amer: 61 mL/min — ABNORMAL LOW (ref >90)
Glucose, Bld: 173 mg/dL — ABNORMAL HIGH (ref 70–99)
Sodium: 138 mEq/L (ref 135–145)
Total Protein: 7.7 g/dL (ref 6.0–8.3)

## 2011-10-12 LAB — URINE MICROSCOPIC-ADD ON

## 2011-10-12 LAB — URINALYSIS, ROUTINE W REFLEX MICROSCOPIC
Glucose, UA: NEGATIVE mg/dL
Hgb urine dipstick: NEGATIVE
Ketones, ur: 15 mg/dL — AB
Protein, ur: NEGATIVE mg/dL
pH: 6 (ref 5.0–8.0)

## 2011-10-12 LAB — APTT: aPTT: 40 seconds — ABNORMAL HIGH (ref 24–37)

## 2011-10-12 LAB — PROTIME-INR
INR: 2.15 — ABNORMAL HIGH (ref 0.00–1.49)
Prothrombin Time: 24.4 seconds — ABNORMAL HIGH (ref 11.6–15.2)

## 2011-10-12 LAB — CBC
MCHC: 32.2 g/dL (ref 30.0–36.0)
RBC: 3.92 MIL/uL (ref 3.87–5.11)
WBC: 8.3 10*3/uL (ref 4.0–10.5)

## 2011-10-12 LAB — SURGICAL PCR SCREEN: MRSA, PCR: NEGATIVE

## 2011-10-12 NOTE — Pre-Procedure Instructions (Signed)
20 Jamilette Suchocki Hyson  10/12/2011   Your procedure is scheduled on:  Wednesday September 4  Report to [facility] at 8:15 AM.  Call this number if you have problems the morning of surgery: [phone]   Remember:   Do not eat or drink:After Midnight.    Take these medicines the morning of surgery with A SIP OF WATER: Norvasc (amlodipine), Prilosec (omeprazole), Inderal (propranolol), Topamax (topiramate). Norco (hydrocodone pain pill) if needed. May use eye drops.    Do not wear jewelry, make-up or nail polish.  Do not wear lotions, powders, or perfumes. You may wear deodorant.  Do not shave 48 hours prior to surgery. Men may shave face and neck.  Do not bring valuables to the hospital.  Contacts, dentures or bridgework may not be worn into surgery.  Leave suitcase in the car. After surgery it may be brought to your room.  For patients admitted to the hospital, checkout time is 11:00 AM the day of discharge.   Patients discharged the day of surgery will not be allowed to drive home.  Name and phone number of your driver: NA  Special Instructions: Incentive Spirometry - Practice and bring it with you on the day of surgery. and CHG Shower Use Special Wash: 1/2 bottle night before surgery and 1/2 bottle morning of surgery.   Please read over the following fact sheets that you were given: Pain Booklet, Coughing and Deep Breathing, Blood Transfusion Information, Total Joint Packet and Surgical Site Infection Prevention

## 2011-10-13 NOTE — Consult Note (Signed)
Chart Review: Patient is a 71 year old female scheduled for left TKR on 10/20/11 08/25/11 by Dr. Eulah Pont. This procedure was initially scheduled for 08/25/11, but was cancelled due to need for EGD after finding fresh blood on small bowel capsule study. On 08/24/11 she underwent an EGD showing local gastritis at the antrum and fundus and a single lesion involving second part of the duodenum with features of Dieaulafoy lesion that was coagulated with Gold probe.  PCP is Dr. Syliva Overman.  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  Her history also lists afib, but this is not reflected in the most recent notes by Dr. Dietrich Pates and Dr. Lodema Hong or on her EKGs in Epic over the past year.  EKG on 12/08/10 showed SR with sinus arrythmia, minimal voltage criteria for LVH.   Echo on 09/21/10 showed:  - Left ventricle: The cavity size was normal. There was mild focal basal hypertrophy of the septum. Systolic function was normal. The estimated ejection fraction was in the range of 55% to 60%. Wall motion was normal; there were no regional wall motion abnormalities. - Aortic valve: Mildly calcified annulus. Trileaflet; normal thickness, mildly calcified leaflets. Trivial regurgitation. Impressions: - Compared to the previous study performed 07/17/10, prior reading of moderate LVH not supported by present study; RV dilation and dysfunction not noted on the current exam. PA pressure not measured, but there is not evidence for significant pulmonary hypertension.  Her last stress test was done at [facility] on 05/11/10 and showed mild perfusion defect due to infarct/scar with mild peri-infarct ischemia seen in the basal inferolateral, mid inferolateral, and apical lateral regions. No significant wall motion abnormalities, suggesting the absence of true infarction with the potential for artifact. EF 73%.  According to Dr. Iantha Fallen Hilty's letter to Dr. Lodema Hong dated 07/27/10 (under Media tab), he felt the stress test findings were likely artifact and ultimately said that it was a low risk scan.  She has since been followed by Dr. Dietrich Pates at [facility]. On 06/21/11, he cleared her for this procedure from a Cardiology standpoint, but recommended a Lovenox bridge while off Coumadin.   CXR on 08/20/11 showed stable appearance with no new worrisome focal or acute cardiopulmonary abnormality identified.   Labs noted. H/H 10.1/31.4 (overall stable since 09/07/11). PLT 440. PT/PTT elevated. She will need these repeated pre-operatively.  T&S has been done.  UA showed trace leukocytes, negative nitrites, many bacteria.  I faxed labs results to Dr. Greig Right office.  If no significant change in her status and follow-up lab results are reasonable then anticipate she can proceed as planned.  Shonna Chock, PA-C

## 2011-10-14 ENCOUNTER — Other Ambulatory Visit (HOSPITAL_COMMUNITY): Payer: Medicare Other

## 2011-10-19 ENCOUNTER — Telehealth: Payer: Self-pay | Admitting: Family Medicine

## 2011-10-19 DIAGNOSIS — I10 Essential (primary) hypertension: Secondary | ICD-10-CM

## 2011-10-19 DIAGNOSIS — E119 Type 2 diabetes mellitus without complications: Secondary | ICD-10-CM

## 2011-10-19 DIAGNOSIS — E785 Hyperlipidemia, unspecified: Secondary | ICD-10-CM

## 2011-10-19 DIAGNOSIS — K219 Gastro-esophageal reflux disease without esophagitis: Secondary | ICD-10-CM

## 2011-10-19 MED ORDER — CEFAZOLIN SODIUM-DEXTROSE 2-3 GM-% IV SOLR
2.0000 g | INTRAVENOUS | Status: AC
  Administered 2011-10-20: 2 g via INTRAVENOUS
  Filled 2011-10-19: qty 50

## 2011-10-19 MED ORDER — TOPIRAMATE 100 MG PO TABS: 100.0000 mg | ORAL_TABLET | Freq: Two times a day (BID) | ORAL | Status: DC

## 2011-10-19 MED ORDER — LOSARTAN POTASSIUM-HCTZ 50-12.5 MG PO TABS: 1.0000 | ORAL_TABLET | Freq: Every day | ORAL | Status: DC

## 2011-10-19 MED ORDER — AMLODIPINE BESYLATE 10 MG PO TABS: 10.0000 mg | ORAL_TABLET | Freq: Every day | ORAL | Status: DC

## 2011-10-19 MED ORDER — PROPRANOLOL HCL 80 MG PO TABS: 80.0000 mg | ORAL_TABLET | Freq: Every day | ORAL | Status: DC

## 2011-10-19 MED ORDER — METFORMIN HCL 500 MG PO TABS: 500.0000 mg | ORAL_TABLET | Freq: Two times a day (BID) | ORAL | Status: DC

## 2011-10-19 MED ORDER — OMEPRAZOLE 20 MG PO CPDR: 20.0000 mg | DELAYED_RELEASE_CAPSULE | Freq: Two times a day (BID) | ORAL | Status: DC

## 2011-10-19 MED ORDER — PRAVASTATIN SODIUM 20 MG PO TABS: 20.0000 mg | ORAL_TABLET | Freq: Every evening | ORAL | Status: DC

## 2011-10-19 NOTE — H&P (Signed)
  [facility] [address]   [address] [place], [place] [address] [phone] A Division of [facility]  Loreta Ave, M.D.     Robert A. Thurston Hole, M.D.     Lunette Stands, M.D. Eulas Post, M.D.    Buford Dresser, M.D. Estell Harpin, M.D. Ralene Cork, D.O.          Genene Churn. Barry Dienes, PA-C            Kirstin A. Shepperson, PA-C Burgin, OPA-C   RE: [name], [name]                                [id number]      DOB: [birth year] PROGRESS NOTE: 10-12-11 Chief complaint: Left knee pain.  History of present illness: 55 two year-old black female with a history of end stage DJD, left knee, and chronic pain.  Returns.  States that knee symptoms are unchanged from previous visit.  She is wanting to proceed with total knee replacement as scheduled.  Patient was originally scheduled for surgery last month, but this was cancelled by her gastroenterologist, Dr. Karilyn Cota, due to a small intestinal bleed that was discovered.  We have not received any notes from Dr. Patty Sermons office regarding the findings, treatment or to state that patient has been officially cleared for surgery.  Patient's chronic Coumadin has been resumed and she is due to start Lovenox bridge.  She currently denies any GI issues.  No abdominal pain.  No hematochezia.   Current medications: Norvasc, Ferrous Sulfate, fiber powder, Prozac, Lasix, Norco, Tofranil, Indocin, Skelaxin, Xalatan, Hyzaar, Metformin, Macrobid, Prilosec, potassium Gluconate, Pravachol, Inderal, Restoril, Topamax and Coumadin. Allergies: Codeine and Sulfa.  Past medical/surgical history: Bilateral ankle surgery, hypertension, diabetes, history of pulmonary embolism, GERD, anemia and GI bleed. Review of systems: Patient currently denies lightheadedness, dizziness, fevers, chills, cardiac, pulmonary, GI or GU issues. Family history: Positive for heart disease, hypertension and kidney  disease. Social history: Does not smoke or drink.  Patient is married.      EXAMINATION: Pleasant black female, alert and oriented x 3 and in no acute distress.  No increase in respiratory effort.  Head is normocephalic, a traumatic.  PERRLA, EOMI.  Lungs: CTA bilaterally.  No wheezes.  Heart: RRR.  S1 and S2.  No murmurs.  Abdomen: Round and non-distended.  NBS x 4.  Soft and non-tender.  Left knee: Decreased range of motion.  Positive crepitus. Joint line tender.  Positive effusion.  Calf non-tender.  Neurovascularly intact.  Skin warm and dry.     X-RAYS: Left knee, AP, lateral and sunrise views, show end stage DJD with periarticular spurs.    IMPRESSION: End stage DJD, left knee, and chronic pain.  Failed conservative treatment.  PLAN: We will proceed with left total knee replacement as scheduled.  Advised patient that we must get clearance from Dr. Karilyn Cota.  Patient voices understanding.  All questions answered.    Loreta Ave, M.D.   Electronically verified by Loreta Ave, M.D. DFM(JMO):jjh D 10-12-11 T 10-13-11

## 2011-10-19 NOTE — Telephone Encounter (Signed)
Refilled

## 2011-10-20 ENCOUNTER — Encounter (HOSPITAL_COMMUNITY): Payer: Self-pay | Admitting: Vascular Surgery

## 2011-10-20 ENCOUNTER — Inpatient Hospital Stay (HOSPITAL_COMMUNITY): Payer: Medicare Other | Admitting: Vascular Surgery

## 2011-10-20 ENCOUNTER — Encounter (HOSPITAL_COMMUNITY)
Admission: RE | Disposition: A | Discharge status: Skilled Nursing Facility | Payer: Self-pay | Source: Ambulatory Visit | Attending: Orthopedic Surgery

## 2011-10-20 ENCOUNTER — Inpatient Hospital Stay (HOSPITAL_COMMUNITY): Payer: Medicare Other

## 2011-10-20 ENCOUNTER — Encounter (HOSPITAL_COMMUNITY): Payer: Self-pay | Admitting: *Deleted

## 2011-10-20 ENCOUNTER — Inpatient Hospital Stay (HOSPITAL_COMMUNITY)
Admission: RE | Admit: 2011-10-20 | Discharge: 2011-10-22 | DRG: 470 | Disposition: A | Discharge status: Skilled Nursing Facility | Payer: Medicare Other | Source: Ambulatory Visit | Attending: Orthopedic Surgery | Admitting: Orthopedic Surgery

## 2011-10-20 DIAGNOSIS — M949 Disorder of cartilage, unspecified: Secondary | ICD-10-CM | POA: Diagnosis present

## 2011-10-20 DIAGNOSIS — I10 Essential (primary) hypertension: Secondary | ICD-10-CM

## 2011-10-20 DIAGNOSIS — K219 Gastro-esophageal reflux disease without esophagitis: Secondary | ICD-10-CM | POA: Diagnosis present

## 2011-10-20 DIAGNOSIS — D500 Iron deficiency anemia secondary to blood loss (chronic): Secondary | ICD-10-CM | POA: Diagnosis present

## 2011-10-20 DIAGNOSIS — D62 Acute posthemorrhagic anemia: Secondary | ICD-10-CM | POA: Diagnosis not present

## 2011-10-20 DIAGNOSIS — M899 Disorder of bone, unspecified: Secondary | ICD-10-CM | POA: Diagnosis present

## 2011-10-20 DIAGNOSIS — G8929 Other chronic pain: Secondary | ICD-10-CM | POA: Diagnosis present

## 2011-10-20 DIAGNOSIS — Z86711 Personal history of pulmonary embolism: Secondary | ICD-10-CM

## 2011-10-20 DIAGNOSIS — Z96659 Presence of unspecified artificial knee joint: Secondary | ICD-10-CM

## 2011-10-20 DIAGNOSIS — M1710 Unilateral primary osteoarthritis, unspecified knee: Principal | ICD-10-CM | POA: Diagnosis present

## 2011-10-20 DIAGNOSIS — E785 Hyperlipidemia, unspecified: Secondary | ICD-10-CM

## 2011-10-20 DIAGNOSIS — E119 Type 2 diabetes mellitus without complications: Secondary | ICD-10-CM

## 2011-10-20 DIAGNOSIS — IMO0002 Reserved for concepts with insufficient information to code with codable children: Principal | ICD-10-CM | POA: Diagnosis present

## 2011-10-20 HISTORY — PX: TOTAL KNEE ARTHROPLASTY: SHX125

## 2011-10-20 LAB — APTT: aPTT: 27 seconds (ref 24–37)

## 2011-10-20 LAB — GLUCOSE, CAPILLARY: Glucose-Capillary: 109 mg/dL — ABNORMAL HIGH (ref 70–99)

## 2011-10-20 SURGERY — ARTHROPLASTY, KNEE, TOTAL
Anesthesia: General | Site: Knee | Laterality: Left | Wound class: Clean

## 2011-10-20 MED ORDER — DEXTROSE 5 % IV SOLN
500.0000 mg | INTRAVENOUS | Status: DC
  Administered 2011-10-20: 500 mg via INTRAVENOUS
  Filled 2011-10-20: qty 5

## 2011-10-20 MED ORDER — ENOXAPARIN SODIUM 30 MG/0.3ML ~~LOC~~ SOLN
30.0000 mg | Freq: Two times a day (BID) | SUBCUTANEOUS | Status: DC
  Filled 2011-10-20 (×2): qty 0.3

## 2011-10-20 MED ORDER — IMIPRAMINE HCL 50 MG PO TABS
100.0000 mg | ORAL_TABLET | Freq: Every day | ORAL | Status: DC
  Administered 2011-10-20 – 2011-10-21 (×2): 100 mg via ORAL
  Filled 2011-10-20 (×4): qty 2

## 2011-10-20 MED ORDER — ROCURONIUM BROMIDE 100 MG/10ML IV SOLN
INTRAVENOUS | Status: DC | PRN
  Administered 2011-10-20: 50 mg via INTRAVENOUS

## 2011-10-20 MED ORDER — HYDROCODONE-ACETAMINOPHEN 7.5-325 MG PO TABS
1.0000 | ORAL_TABLET | ORAL | Status: DC | PRN
  Administered 2011-10-21: 1 via ORAL
  Filled 2011-10-20: qty 1

## 2011-10-20 MED ORDER — FERROUS SULFATE 325 (65 FE) MG PO TABS
325.0000 mg | ORAL_TABLET | Freq: Two times a day (BID) | ORAL | Status: DC
  Administered 2011-10-20 – 2011-10-22 (×4): 325 mg via ORAL
  Filled 2011-10-20 (×6): qty 1

## 2011-10-20 MED ORDER — ENOXAPARIN SODIUM 30 MG/0.3ML ~~LOC~~ SOLN
30.0000 mg | Freq: Two times a day (BID) | SUBCUTANEOUS | Status: DC
  Filled 2011-10-20 (×3): qty 0.3

## 2011-10-20 MED ORDER — FENTANYL CITRATE 0.05 MG/ML IJ SOLN
50.0000 ug | INTRAMUSCULAR | Status: DC | PRN
  Administered 2011-10-20: 100 ug via INTRAVENOUS

## 2011-10-20 MED ORDER — PROPOFOL 10 MG/ML IV EMUL
INTRAVENOUS | Status: DC | PRN
  Administered 2011-10-20: 50 mg via INTRAVENOUS
  Administered 2011-10-20: 150 mg via INTRAVENOUS

## 2011-10-20 MED ORDER — ACETAMINOPHEN 650 MG RE SUPP: 650.0000 mg | Freq: Four times a day (QID) | RECTAL | Status: DC | PRN

## 2011-10-20 MED ORDER — ONDANSETRON HCL 4 MG/2ML IJ SOLN
INTRAMUSCULAR | Status: DC | PRN
  Administered 2011-10-20: 4 mg via INTRAVENOUS

## 2011-10-20 MED ORDER — GLYCOPYRROLATE 0.2 MG/ML IJ SOLN
INTRAMUSCULAR | Status: DC | PRN
  Administered 2011-10-20: 0.6 mg via INTRAVENOUS

## 2011-10-20 MED ORDER — PROPRANOLOL HCL 80 MG PO TABS
80.0000 mg | ORAL_TABLET | Freq: Every day | ORAL | Status: DC
  Administered 2011-10-20 – 2011-10-22 (×3): 80 mg via ORAL
  Filled 2011-10-20 (×3): qty 1

## 2011-10-20 MED ORDER — DOCUSATE SODIUM 100 MG PO CAPS
100.0000 mg | ORAL_CAPSULE | Freq: Two times a day (BID) | ORAL | Status: DC
  Administered 2011-10-20 – 2011-10-22 (×4): 100 mg via ORAL
  Filled 2011-10-20 (×5): qty 1

## 2011-10-20 MED ORDER — ONDANSETRON HCL 4 MG/2ML IJ SOLN
4.0000 mg | Freq: Four times a day (QID) | INTRAMUSCULAR | Status: DC | PRN
  Administered 2011-10-21: 4 mg via INTRAVENOUS
  Filled 2011-10-20: qty 2

## 2011-10-20 MED ORDER — MIDAZOLAM HCL 5 MG/5ML IJ SOLN
INTRAMUSCULAR | Status: DC | PRN
  Administered 2011-10-20: 1 mg via INTRAVENOUS

## 2011-10-20 MED ORDER — AMLODIPINE BESYLATE 10 MG PO TABS
10.0000 mg | ORAL_TABLET | Freq: Every day | ORAL | Status: DC
  Administered 2011-10-20 – 2011-10-22 (×3): 10 mg via ORAL
  Filled 2011-10-20 (×3): qty 1

## 2011-10-20 MED ORDER — FENTANYL CITRATE 0.05 MG/ML IJ SOLN
INTRAMUSCULAR | Status: AC
  Filled 2011-10-20: qty 2

## 2011-10-20 MED ORDER — SIMVASTATIN 10 MG PO TABS
10.0000 mg | ORAL_TABLET | Freq: Every day | ORAL | Status: DC
  Administered 2011-10-20 – 2011-10-21 (×2): 10 mg via ORAL
  Filled 2011-10-20 (×3): qty 1

## 2011-10-20 MED ORDER — HYDROCHLOROTHIAZIDE 25 MG PO TABS
12.5000 mg | ORAL_TABLET | Freq: Every day | ORAL | Status: DC
  Filled 2011-10-20: qty 0.5

## 2011-10-20 MED ORDER — MENTHOL 3 MG MT LOZG: 1.0000 | LOZENGE | OROMUCOSAL | Status: DC | PRN

## 2011-10-20 MED ORDER — MORPHINE SULFATE 4 MG/ML IJ SOLN
INTRAMUSCULAR | Status: AC
  Filled 2011-10-20: qty 1

## 2011-10-20 MED ORDER — FENTANYL CITRATE 0.05 MG/ML IJ SOLN
25.0000 ug | INTRAMUSCULAR | Status: DC | PRN
  Administered 2011-10-20 (×3): 50 ug via INTRAVENOUS

## 2011-10-20 MED ORDER — LOSARTAN POTASSIUM-HCTZ 50-12.5 MG PO TABS: 1.0000 | ORAL_TABLET | Freq: Every day | ORAL | Status: DC

## 2011-10-20 MED ORDER — METHOCARBAMOL 100 MG/ML IJ SOLN
500.0000 mg | Freq: Four times a day (QID) | INTRAVENOUS | Status: DC | PRN
  Filled 2011-10-20: qty 5

## 2011-10-20 MED ORDER — MIDAZOLAM HCL 2 MG/2ML IJ SOLN
INTRAMUSCULAR | Status: AC
  Filled 2011-10-20: qty 2

## 2011-10-20 MED ORDER — TOPIRAMATE 100 MG PO TABS
100.0000 mg | ORAL_TABLET | Freq: Two times a day (BID) | ORAL | Status: DC
Start: 2011-10-20 — End: 2011-10-22
  Administered 2011-10-20 – 2011-10-22 (×4): 100 mg via ORAL
  Filled 2011-10-20 (×5): qty 1

## 2011-10-20 MED ORDER — PANTOPRAZOLE SODIUM 40 MG PO TBEC
40.0000 mg | DELAYED_RELEASE_TABLET | Freq: Every day | ORAL | Status: DC
  Administered 2011-10-21 – 2011-10-22 (×2): 40 mg via ORAL
  Filled 2011-10-20 (×2): qty 1

## 2011-10-20 MED ORDER — METOCLOPRAMIDE HCL 10 MG PO TABS: 5.0000 mg | ORAL_TABLET | Freq: Three times a day (TID) | ORAL | Status: DC | PRN

## 2011-10-20 MED ORDER — LATANOPROST 0.005 % OP SOLN
1.0000 [drp] | Freq: Every day | OPHTHALMIC | Status: DC
  Administered 2011-10-20 – 2011-10-21 (×2): 1 [drp] via OPHTHALMIC
  Filled 2011-10-20 (×2): qty 2.5

## 2011-10-20 MED ORDER — METOCLOPRAMIDE HCL 5 MG/ML IJ SOLN
5.0000 mg | Freq: Three times a day (TID) | INTRAMUSCULAR | Status: DC | PRN
  Administered 2011-10-21: 10 mg via INTRAVENOUS
  Filled 2011-10-20: qty 2

## 2011-10-20 MED ORDER — SODIUM CHLORIDE 0.9 % IR SOLN
Status: DC | PRN
  Administered 2011-10-20: 3000 mL
  Administered 2011-10-20: 1000 mL

## 2011-10-20 MED ORDER — LACTATED RINGERS IV SOLN
INTRAVENOUS | Status: DC | PRN
  Administered 2011-10-20 (×2): via INTRAVENOUS

## 2011-10-20 MED ORDER — FUROSEMIDE 20 MG PO TABS
20.0000 mg | ORAL_TABLET | Freq: Every day | ORAL | Status: DC
  Administered 2011-10-21: 20 mg via ORAL
  Filled 2011-10-20 (×3): qty 1

## 2011-10-20 MED ORDER — POLYETHYLENE GLYCOL 3350 17 GM/SCOOP PO POWD
17.0000 g | Freq: Every day | ORAL | Status: DC | PRN
  Filled 2011-10-20: qty 255

## 2011-10-20 MED ORDER — POTASSIUM CHLORIDE IN NACL 20-0.9 MEQ/L-% IV SOLN
INTRAVENOUS | Status: DC
  Administered 2011-10-20 – 2011-10-21 (×2): via INTRAVENOUS
  Filled 2011-10-20 (×4): qty 1000

## 2011-10-20 MED ORDER — HYDROCHLOROTHIAZIDE 12.5 MG PO CAPS
12.5000 mg | ORAL_CAPSULE | Freq: Every day | ORAL | Status: DC
  Administered 2011-10-20 – 2011-10-22 (×3): 12.5 mg via ORAL
  Filled 2011-10-20 (×3): qty 1

## 2011-10-20 MED ORDER — LOSARTAN POTASSIUM 50 MG PO TABS
50.0000 mg | ORAL_TABLET | Freq: Every day | ORAL | Status: DC
  Filled 2011-10-20: qty 1

## 2011-10-20 MED ORDER — LACTATED RINGERS IV SOLN
INTRAVENOUS | Status: DC
  Administered 2011-10-20: 11:00:00 via INTRAVENOUS

## 2011-10-20 MED ORDER — LIDOCAINE HCL (CARDIAC) 20 MG/ML IV SOLN
INTRAVENOUS | Status: DC | PRN
  Administered 2011-10-20: 80 mg via INTRAVENOUS

## 2011-10-20 MED ORDER — METHOCARBAMOL 500 MG PO TABS
500.0000 mg | ORAL_TABLET | Freq: Four times a day (QID) | ORAL | Status: DC | PRN
  Administered 2011-10-20 – 2011-10-21 (×2): 500 mg via ORAL
  Filled 2011-10-20 (×2): qty 1

## 2011-10-20 MED ORDER — MIDAZOLAM HCL 2 MG/2ML IJ SOLN: 0.5000 mg | Freq: Once | INTRAMUSCULAR | Status: DC

## 2011-10-20 MED ORDER — PHENOL 1.4 % MT LIQD: 1.0000 | OROMUCOSAL | Status: DC | PRN

## 2011-10-20 MED ORDER — METFORMIN HCL 500 MG PO TABS
500.0000 mg | ORAL_TABLET | Freq: Two times a day (BID) | ORAL | Status: DC
  Administered 2011-10-20 – 2011-10-22 (×4): 500 mg via ORAL
  Filled 2011-10-20 (×6): qty 1

## 2011-10-20 MED ORDER — MIDAZOLAM HCL 2 MG/2ML IJ SOLN
1.0000 mg | INTRAMUSCULAR | Status: DC | PRN
  Administered 2011-10-20: 2 mg via INTRAVENOUS

## 2011-10-20 MED ORDER — LOSARTAN POTASSIUM 50 MG PO TABS
50.0000 mg | ORAL_TABLET | Freq: Every day | ORAL | Status: DC
  Administered 2011-10-20 – 2011-10-22 (×3): 50 mg via ORAL
  Filled 2011-10-20 (×3): qty 1

## 2011-10-20 MED ORDER — ACETAMINOPHEN 325 MG PO TABS: 650.0000 mg | ORAL_TABLET | Freq: Four times a day (QID) | ORAL | Status: DC | PRN

## 2011-10-20 MED ORDER — FLEET ENEMA 7-19 GM/118ML RE ENEM: 1.0000 | ENEMA | Freq: Once | RECTAL | Status: AC | PRN

## 2011-10-20 MED ORDER — FENTANYL CITRATE 0.05 MG/ML IJ SOLN
INTRAMUSCULAR | Status: DC | PRN
  Administered 2011-10-20 (×4): 50 ug via INTRAVENOUS

## 2011-10-20 MED ORDER — WARFARIN - PHARMACIST DOSING INPATIENT: Freq: Every day | Status: DC

## 2011-10-20 MED ORDER — NEOSTIGMINE METHYLSULFATE 1 MG/ML IJ SOLN
INTRAMUSCULAR | Status: DC | PRN
  Administered 2011-10-20: 3 mg via INTRAVENOUS

## 2011-10-20 MED ORDER — WARFARIN SODIUM 7.5 MG PO TABS
7.5000 mg | ORAL_TABLET | Freq: Once | ORAL | Status: AC
  Administered 2011-10-20: 7.5 mg via ORAL
  Filled 2011-10-20 (×2): qty 1

## 2011-10-20 MED ORDER — CEFAZOLIN SODIUM 1-5 GM-% IV SOLN
1.0000 g | Freq: Three times a day (TID) | INTRAVENOUS | Status: AC
  Administered 2011-10-20 – 2011-10-21 (×3): 1 g via INTRAVENOUS
  Filled 2011-10-20 (×3): qty 50

## 2011-10-20 MED ORDER — PROPRANOLOL HCL 80 MG PO TABS
80.0000 mg | ORAL_TABLET | Freq: Once | ORAL | Status: AC
  Administered 2011-10-20: 80 mg via ORAL
  Filled 2011-10-20: qty 1

## 2011-10-20 MED ORDER — BUPIVACAINE HCL (PF) 0.25 % IJ SOLN
INTRAMUSCULAR | Status: AC
  Filled 2011-10-20: qty 30

## 2011-10-20 MED ORDER — HYDROMORPHONE HCL PF 1 MG/ML IJ SOLN
0.5000 mg | INTRAMUSCULAR | Status: DC | PRN
  Administered 2011-10-20 – 2011-10-21 (×6): 0.5 mg via INTRAVENOUS
  Filled 2011-10-20 (×5): qty 1

## 2011-10-20 MED ORDER — LIDOCAINE HCL (CARDIAC) 20 MG/ML IV SOLN: INTRAVENOUS | Status: DC | PRN

## 2011-10-20 MED ORDER — HYDROMORPHONE HCL PF 1 MG/ML IJ SOLN
INTRAMUSCULAR | Status: AC
  Filled 2011-10-20: qty 2

## 2011-10-20 MED ORDER — ONDANSETRON HCL 4 MG PO TABS
4.0000 mg | ORAL_TABLET | Freq: Four times a day (QID) | ORAL | Status: DC | PRN
  Administered 2011-10-22: 4 mg via ORAL
  Filled 2011-10-20: qty 1

## 2011-10-20 SURGICAL SUPPLY — 66 items
AUTOTRANSFUSION W/QD PVC DRAIN (AUTOTRANSFUSION) ×2 IMPLANT
BANDAGE ESMARK 6X9 LF (GAUZE/BANDAGES/DRESSINGS) ×1 IMPLANT
BASEPLATE TIBIAL SZ4 (Plate) ×2 IMPLANT
BLADE SAG 18X100X1.27 (BLADE) ×4 IMPLANT
BNDG ESMARK 6X9 LF (GAUZE/BANDAGES/DRESSINGS) ×2
BOOTCOVER CLEANROOM LRG (PROTECTIVE WEAR) ×4 IMPLANT
BOWL SMART MIX CTS (DISPOSABLE) ×2 IMPLANT
CEMENT BONE SIMPLEX SPEEDSET (Cement) ×4 IMPLANT
CLOTH BEACON ORANGE TIMEOUT ST (SAFETY) ×2 IMPLANT
COVER BACK TABLE 24X17X13 BIG (DRAPES) IMPLANT
COVER SURGICAL LIGHT HANDLE (MISCELLANEOUS) ×2 IMPLANT
CUFF TOURNIQUET SINGLE 34IN LL (TOURNIQUET CUFF) ×2 IMPLANT
DRAPE EXTREMITY T 121X128X90 (DRAPE) ×2 IMPLANT
DRAPE PROXIMA HALF (DRAPES) ×2 IMPLANT
DRAPE U-SHAPE 47X51 STRL (DRAPES) ×2 IMPLANT
DRSG PAD ABDOMINAL 8X10 ST (GAUZE/BANDAGES/DRESSINGS) ×4 IMPLANT
DURAPREP 26ML APPLICATOR (WOUND CARE) ×2 IMPLANT
ELECT CAUTERY BLADE 6.4 (BLADE) ×2 IMPLANT
ELECT REM PT RETURN 9FT ADLT (ELECTROSURGICAL) ×2
ELECTRODE REM PT RTRN 9FT ADLT (ELECTROSURGICAL) ×1 IMPLANT
EVACUATOR 1/8 PVC DRAIN (DRAIN) ×2 IMPLANT
FACESHIELD LNG OPTICON STERILE (SAFETY) ×6 IMPLANT
GAUZE XEROFORM 5X9 LF (GAUZE/BANDAGES/DRESSINGS) ×2 IMPLANT
GLOVE BIO SURGEON STRL SZ 6.5 (GLOVE) ×2 IMPLANT
GLOVE BIOGEL PI IND STRL 6.5 (GLOVE) ×1 IMPLANT
GLOVE BIOGEL PI IND STRL 8 (GLOVE) ×3 IMPLANT
GLOVE BIOGEL PI INDICATOR 6.5 (GLOVE) ×1
GLOVE BIOGEL PI INDICATOR 8 (GLOVE) ×3
GLOVE EUDERMIC 7 POWDERFREE (GLOVE) ×4 IMPLANT
GLOVE ORTHO TXT STRL SZ7.5 (GLOVE) ×4 IMPLANT
GLOVE SS BIOGEL STRL SZ 8.5 (GLOVE) ×2 IMPLANT
GLOVE SUPERSENSE BIOGEL SZ 8.5 (GLOVE) ×2
GLOVE SURG SS PI 7.5 STRL IVOR (GLOVE) ×4 IMPLANT
GOWN PREVENTION PLUS XLARGE (GOWN DISPOSABLE) ×2 IMPLANT
GOWN STRL NON-REIN LRG LVL3 (GOWN DISPOSABLE) ×2 IMPLANT
GOWN STRL REIN 2XL XLG LVL4 (GOWN DISPOSABLE) ×6 IMPLANT
HANDPIECE INTERPULSE COAX TIP (DISPOSABLE) ×1
IMMOBILIZER KNEE 22 UNIV (SOFTGOODS) ×2 IMPLANT
IMMOBILIZER KNEE 24 THIGH 36 (MISCELLANEOUS) IMPLANT
IMMOBILIZER KNEE 24 UNIV (MISCELLANEOUS)
KIT BASIN OR (CUSTOM PROCEDURE TRAY) ×2 IMPLANT
KIT ROOM TURNOVER OR (KITS) ×2 IMPLANT
MANIFOLD NEPTUNE II (INSTRUMENTS) ×2 IMPLANT
MARKER SKIN DUAL TIP RULER LAB (MISCELLANEOUS) ×2 IMPLANT
NS IRRIG 1000ML POUR BTL (IV SOLUTION) IMPLANT
PACK TOTAL JOINT (CUSTOM PROCEDURE TRAY) ×2 IMPLANT
PAD ARMBOARD 7.5X6 YLW CONV (MISCELLANEOUS) ×4 IMPLANT
PAD CAST 4YDX4 CTTN HI CHSV (CAST SUPPLIES) ×1 IMPLANT
PADDING CAST COTTON 4X4 STRL (CAST SUPPLIES) ×1
PADDING CAST COTTON 6X4 STRL (CAST SUPPLIES) ×2 IMPLANT
RUBBERBAND STERILE (MISCELLANEOUS) ×2 IMPLANT
SET HNDPC FAN SPRY TIP SCT (DISPOSABLE) ×1 IMPLANT
SPONGE GAUZE 4X4 12PLY (GAUZE/BANDAGES/DRESSINGS) ×2 IMPLANT
STAPLER VISISTAT 35W (STAPLE) ×2 IMPLANT
STEM CEMENTED (Stem) ×2 IMPLANT
SUCTION FRAZIER TIP 10 FR DISP (SUCTIONS) IMPLANT
SUT VIC AB 1 CTX 36 (SUTURE) ×2
SUT VIC AB 1 CTX36XBRD ANBCTR (SUTURE) ×2 IMPLANT
SUT VIC AB 2-0 CT1 27 (SUTURE) ×2
SUT VIC AB 2-0 CT1 TAPERPNT 27 (SUTURE) ×2 IMPLANT
SYR 30ML LL (SYRINGE) ×2 IMPLANT
SYR 30ML SLIP (SYRINGE) IMPLANT
TOWEL OR 17X24 6PK STRL BLUE (TOWEL DISPOSABLE) ×2 IMPLANT
TOWEL OR 17X26 10 PK STRL BLUE (TOWEL DISPOSABLE) ×2 IMPLANT
TRAY FOLEY CATH 14FR (SET/KITS/TRAYS/PACK) ×2 IMPLANT
WATER STERILE IRR 1000ML POUR (IV SOLUTION) ×2 IMPLANT

## 2011-10-20 NOTE — Interval H&P Note (Signed)
History and Physical Interval Note:  10/20/2011 8:11 AM  [name]  has presented today for surgery, with the diagnosis of DJD LEFT KNEE  The various methods of treatment have been discussed with the patient and family. After consideration of risks, benefits and other options for treatment, the patient has consented to  Procedure(s) (LRB) with comments: TOTAL KNEE ARTHROPLASTY (Left) as a surgical intervention .  The patient's history has been reviewed, patient examined, no change in status, stable for surgery.  I have reviewed the patient's chart and labs.  Questions were answered to the patient's satisfaction.     [name] F

## 2011-10-20 NOTE — Progress Notes (Signed)
Autovac infusion started 500 cc's.

## 2011-10-20 NOTE — Progress Notes (Signed)
Dr. Randa Evens notified of 10/10 headache. Patient reports that this is typical of her migraine pattern HA. No orders given.

## 2011-10-20 NOTE — Progress Notes (Signed)
Orthopedic Tech Progress Note Patient Details:  [name] 1940-05-14 [id number] Applied overhead frame and trapeze bar. CPM Left Knee CPM Left Knee: On Left Knee Flexion (Degrees): 60  Left Knee Extension (Degrees): 0    Jennye Moccasin 10/20/2011, 3:12 PM

## 2011-10-20 NOTE — Anesthesia Postprocedure Evaluation (Signed)
  Anesthesia Post-op Note  Patient: [name]  Procedure(s) Performed: Procedure(s) (LRB) with comments: TOTAL KNEE ARTHROPLASTY (Left)  Patient Location: PACU  Anesthesia Type: General  Level of Consciousness: awake  Airway and Oxygen Therapy: Patient Spontanous Breathing  Post-op Pain: mild  Post-op Assessment: Post-op Vital signs reviewed  Post-op Vital Signs: Reviewed  Complications: No apparent anesthesia complications 

## 2011-10-20 NOTE — Progress Notes (Signed)
UR COMPLETED  

## 2011-10-20 NOTE — Progress Notes (Signed)
CPM in place 0 - 60 pt tolerating well.

## 2011-10-20 NOTE — Transfer of Care (Signed)
Immediate Anesthesia Transfer of Care Note  Patient: [name]  Procedure(s) Performed: Procedure(s) (LRB) with comments: TOTAL KNEE ARTHROPLASTY (Left)  Patient Location: PACU  Anesthesia Type: General and Regional  Level of Consciousness: awake, alert , oriented and sedated  Airway & Oxygen Therapy: Patient Spontanous Breathing and Patient connected to nasal cannula oxygen  Post-op Assessment: Report given to PACU RN, Post -op Vital signs reviewed and stable and Patient moving all extremities  Post vital signs: Reviewed and stable  Complications: No apparent anesthesia complications

## 2011-10-20 NOTE — Preoperative (Signed)
Beta Blockers   Reason not to administer Beta Blockers:Not Applicable 

## 2011-10-20 NOTE — Anesthesia Procedure Notes (Signed)
Anesthesia Regional Block:  Femoral nerve block  Pre-Anesthetic Checklist: ,, timeout performed, Correct Patient, Correct Site, Correct Laterality, Correct Procedure, Correct Position, site marked, Risks and benefits discussed,  Surgical consent,  Pre-op evaluation,  At surgeon's request and post-op pain management  Laterality: Left  Prep: chloraprep       Needles:   Needle Type: Echogenic Needle          Additional Needles:  Procedures: Doppler guided Femoral nerve block  Nerve Stimulator or Paresthesia:  Response: 0.5 mA,   Additional Responses:   Narrative:  Start time: 10/20/2011 10:45 AM End time: 10/20/2011 11:00 AM Injection made incrementally with aspirations every 5 mL.  Performed by: Personally  Anesthesiologist: Dr. Randa Evens  Femoral nerve block

## 2011-10-20 NOTE — Anesthesia Postprocedure Evaluation (Signed)
  Anesthesia Post-op Note  Patient: [name]  Procedure(s) Performed: Procedure(s) (LRB) with comments: TOTAL KNEE ARTHROPLASTY (Left)  Patient Location: PACU  Anesthesia Type: General  Level of Consciousness: awake  Airway and Oxygen Therapy: Patient Spontanous Breathing  Post-op Pain: mild  Post-op Assessment: Post-op Vital signs reviewed  Post-op Vital Signs: Reviewed  Complications: No apparent anesthesia complications

## 2011-10-20 NOTE — Brief Op Note (Signed)
10/20/2011  9:09 PM  PATIENT:  [name]  71 y.o. female  PRE-OPERATIVE DIAGNOSIS:  DJD LEFT KNEE  POST-OPERATIVE DIAGNOSIS:  DJD LEFT KNEE  PROCEDURE:  Procedure(s) (LRB) with comments: TOTAL KNEE ARTHROPLASTY (Left)  SURGEON:  Surgeon(s) and Role:    * Loreta Ave, MD - Primary  PHYSICIAN ASSISTANT: Zonia Kief M    ANESTHESIA:   regional and general  EBL:  minimal  BLOOD ADMINISTERED:none   SPECIMEN:  No Specimen  DISPOSITION OF SPECIMEN:  N/A  COUNTS:  YES  TOURNIQUET:   Total Tourniquet Time Documented: Thigh (Left) - 82 minutes PATIENT DISPOSITION:  PACU - hemodynamically stable.

## 2011-10-20 NOTE — Anesthesia Preprocedure Evaluation (Signed)
Anesthesia Evaluation  Patient identified by MRN, date of birth, ID band  Reviewed: Allergy & Precautions, H&P , NPO status , Patient's Chart, lab work & pertinent test results  Airway Mallampati: II      Dental   Pulmonary pneumonia -, resolved,  breath sounds clear to auscultation        Cardiovascular hypertension, Pt. on medications Rhythm:Regular Rate:Normal     Neuro/Psych  Headaches, Anxiety Depression    GI/Hepatic Neg liver ROS, GERD-  Controlled,  Endo/Other  diabetes, Type 2  Renal/GU negative Renal ROS     Musculoskeletal   Abdominal   Peds  Hematology negative hematology ROS (+)   Anesthesia Other Findings   Reproductive/Obstetrics                           Anesthesia Physical Anesthesia Plan  ASA: III  Anesthesia Plan:    Post-op Pain Management:    Induction: Intravenous  Airway Management Planned: Oral ETT  Additional Equipment:   Intra-op Plan:   Post-operative Plan: Extubation in OR  Informed Consent: I have reviewed the patients History and Physical, chart, labs and discussed the procedure including the risks, benefits and alternatives for the proposed anesthesia with the patient or authorized representative who has indicated his/her understanding and acceptance.   Dental advisory given  Plan Discussed with:   Anesthesia Plan Comments:         Anesthesia Quick Evaluation

## 2011-10-20 NOTE — Progress Notes (Signed)
cbg 133 

## 2011-10-20 NOTE — Progress Notes (Signed)
500 cc blood infused from autovac without difficulty.

## 2011-10-20 NOTE — Progress Notes (Signed)
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 Fentanyl Itching    Patient just started the patch on 08-08-2011 and she has noted itching, but states that it is helping.  . Sulfonamide Derivatives Hives  . Codeine Itching and Rash    Patient Measurements: Height: 5\' 7"  (170.2 cm) Weight: 211 lb 13.8 oz (96.1 kg) IBW/kg (Calculated) : 61.6    Vital Signs: Temp: 97.6 F (36.4 C) (09/04 1535) Temp src: Oral (09/04 1535) BP: 125/72 mmHg (09/04 1535) Pulse Rate: 77  (09/04 1535)  Labs:  [facility] 10/20/11 0843  HGB --  HCT --  PLT --  APTT 27  LABPROT 13.6  INR 1.02  HEPARINUNFRC --  CREATININE --  CKTOTAL --  CKMB --  TROPONINI --    Estimated Creatinine Clearance: 58.2 ml/min (by C-G formula based on Cr of 1.04).   Medical History: Past Medical History  Diagnosis Date  . Hyperlipidemia     takes Pravastatin daily  . Overweight   . Depression   . Migraine   . Degenerative joint disease     Knees  . Glaucoma   . Pulmonary embolism May 2012    acute presentation, bilateral PE  . Chronic anticoagulation 07/21/2010  . Anemia, iron deficiency 2012    Evaluated by Dr. Darrick Penna; H&H of 9.3/30.8 with MCV-79 in 10/2010; 3/3 positive Hemoccult cards in 07/2011  . Atrial fibrillation     takes Coumadin daily  . Hypertension     takes Hyzaar daily and Propranolol   . Pneumonia 1969    hx of  . Hx of migraines     last one about a yr ago;takes Topamax daily  . Joint pain   . Joint swelling   . Chronic back pain     related to knee pain  . History of gout     was on medication but taken off of several months ago  . Bruises easily     takes Coumadin  . Skin spots-aging   . Gastroesophageal reflux disease     takes Omeprazole daily  . Hx of colonic polyps   . Urinary frequency   . Nocturia   . History of blood transfusion 1969  . Diabetes mellitus     takes Metformin daily  . Glaucoma     uses eye drops at night  . Anxiety     was on Prozac for a couple of weeks  . Insomnia     takes Restoril prn    Medications:  Prescriptions prior to admission  Medication Sig Dispense Refill  . amLODipine (NORVASC) 10 MG tablet Take 1 tablet (10 mg total) by mouth daily.  90 tablet  1  . Enoxaparin Sodium (LOVENOX [place]) Inject 150 mg into the skin.      . Fiber POWD Take 1 scoop by mouth daily as needed. For constipation      . furosemide (LASIX) 40 MG tablet Take 20-40 mg by mouth daily as needed. Take one half to one tablet by mouth once daily as needed for fluid retention      . HYDROcodone-acetaminophen (NORCO) 5-325 MG per tablet Take 1 tablet by mouth every 6 (six) hours as needed. for severe pain      . imipramine (TOFRANIL) 50 MG tablet Take 100 mg by mouth at bedtime.       Marland Kitchen latanoprost (XALATAN) 0.005 %  ophthalmic solution Place 1 drop into both eyes at bedtime.       Marland Kitchen losartan-hydrochlorothiazide (HYZAAR) 50-12.5 MG per tablet Take 1 tablet by mouth daily.  90 tablet  1  . metFORMIN (GLUCOPHAGE) 500 MG tablet Take 1 tablet (500 mg total) by mouth 2 (two) times daily with a meal.  180 tablet  1  . omeprazole (PRILOSEC) 20 MG capsule Take 1 capsule (20 mg total) by mouth 2 (two) times daily.  60 capsule  5  . polyethylene glycol powder (GLYCOLAX/MIRALAX) powder Take 17 g by mouth daily as needed. For constipation      . pravastatin (PRAVACHOL) 20 MG tablet Take 1 tablet (20 mg total) by mouth every evening.  90 tablet  1  . propranolol (INDERAL) 80 MG tablet Take 1 tablet (80 mg total) by mouth daily.  90 tablet  1  . temazepam (RESTORIL) 15 MG capsule Take 15-30 mg by mouth at bedtime as needed. For sleep      . topiramate (TOPAMAX) 100 MG tablet Take 1 tablet (100 mg total) by mouth 2 (two) times daily.  180 tablet  1  . warfarin (COUMADIN) 7.5 MG tablet Take 3.75-7.5 mg by mouth See admin instructions. Half a tablet everyday except Monday  and Thursday take 1 tablet.        Assessment: 71 y.o. F admitted on 9/4 for planned L-TKA. PTA the patient was on chronic warfarin for hx PE with a dose of 3.75 mg daily EXCEPT for 7.5 mg on Mon/Thursday. The last dose before admission was on 8/29 as it was held prior to planned surgery. The patient is now post-op and pharmacy has been consulted to resume warfarin therapy.  INR today is SUBtherapeutic (1.02, goal of 2-3). Bloody drainage is noted to be coming for L knee surgical site. No CBC has been drawn this admission. The patient is also concurrently on Lovenox 30 mg every 12 hours until INR >/= 1.8.   Goal of Therapy:  INR 2-3   Plan:  1. Warfarin 7.5 mg x 1 dose at 1800 today 2. Daily PT/INR 3. If patient considered high risk due to history of PE -- consider full-dose bridging with lovenox.  4. Will continue to monitor for any signs/symptoms of bleeding and will follow up with PT/INR in the a.m.   Georgina Pillion, PharmD, BCPS Clinical Pharmacist Pager: [phone] 10/20/2011 5:44 PM

## 2011-10-20 NOTE — Plan of Care (Signed)
Problem: Consults Goal: Diagnosis- Total Joint Replacement Primary Total Knee Left     

## 2011-10-21 ENCOUNTER — Encounter (HOSPITAL_COMMUNITY): Payer: Self-pay | Admitting: General Practice

## 2011-10-21 LAB — BASIC METABOLIC PANEL
BUN: 8 mg/dL (ref 6–23)
CO2: 22 mEq/L (ref 19–32)
Chloride: 102 mEq/L (ref 96–112)
Creatinine, Ser: 0.89 mg/dL (ref 0.50–1.10)
GFR calc Af Amer: 73 mL/min — ABNORMAL LOW (ref >90)
Glucose, Bld: 128 mg/dL — ABNORMAL HIGH (ref 70–99)

## 2011-10-21 LAB — CBC
HCT: 25.3 % — ABNORMAL LOW (ref 36.0–46.0)
Hemoglobin: 8 g/dL — ABNORMAL LOW (ref 12.0–15.0)
MCHC: 31.6 g/dL (ref 30.0–36.0)
MCV: 79.6 fL (ref 78.0–100.0)
RDW: 15.7 % — ABNORMAL HIGH (ref 11.5–15.5)
WBC: 10.5 10*3/uL (ref 4.0–10.5)

## 2011-10-21 LAB — GLUCOSE, CAPILLARY
Glucose-Capillary: 120 mg/dL — ABNORMAL HIGH (ref 70–99)
Glucose-Capillary: 162 mg/dL — ABNORMAL HIGH (ref 70–99)
Glucose-Capillary: 121 mg/dL — ABNORMAL HIGH (ref 70–99)

## 2011-10-21 LAB — PREPARE RBC (CROSSMATCH)

## 2011-10-21 MED ORDER — WARFARIN SODIUM 7.5 MG PO TABS
7.5000 mg | ORAL_TABLET | Freq: Once | ORAL | Status: AC
  Administered 2011-10-21: 7.5 mg via ORAL
  Filled 2011-10-21: qty 1

## 2011-10-21 MED ORDER — HYDROCODONE-ACETAMINOPHEN 10-325 MG PO TABS
1.0000 | ORAL_TABLET | ORAL | Status: DC | PRN
  Administered 2011-10-21 – 2011-10-22 (×4): 2 via ORAL
  Filled 2011-10-21 (×4): qty 2

## 2011-10-21 MED ORDER — ENOXAPARIN SODIUM 100 MG/ML ~~LOC~~ SOLN
100.0000 mg | Freq: Two times a day (BID) | SUBCUTANEOUS | Status: DC
  Administered 2011-10-21 – 2011-10-22 (×3): 100 mg via SUBCUTANEOUS
  Filled 2011-10-21 (×4): qty 1

## 2011-10-21 MED ORDER — INSULIN ASPART 100 UNIT/ML ~~LOC~~ SOLN
0.0000 [IU] | Freq: Three times a day (TID) | SUBCUTANEOUS | Status: DC
  Administered 2011-10-22: 3 [IU] via SUBCUTANEOUS
  Administered 2011-10-22: 2 [IU] via SUBCUTANEOUS

## 2011-10-21 MED ORDER — FUROSEMIDE 10 MG/ML IJ SOLN
20.0000 mg | Freq: Once | INTRAMUSCULAR | Status: AC
  Administered 2011-10-21: 20 mg via INTRAVENOUS
  Filled 2011-10-21: qty 2

## 2011-10-21 NOTE — Progress Notes (Signed)
Physical Therapy Treatment Patient Details Name: [name] MRN: [id number] DOB: 1941-02-06 Today's Date: 10/21/2011 Time: 0865-7846 PT Time Calculation (min): 28 min  PT Assessment / Plan / Recommendation Comments on Treatment Session  Pt with increase pain this session therefore needed increase assistance for overall mobilty.  Family requesting possible rehab prior to d/c.    Follow Up Recommendations  Home health PT (Need to further assess for possible rehab per family request)    Barriers to Discharge None Pt has supportive caregivers and is motivated to participate in PT.    Equipment Recommendations  Rolling walker with 5" wheels    Recommendations for Other Services    Frequency 7X/week   Plan Discharge plan needs to be updated;Frequency remains appropriate    Precautions / Restrictions Precautions Precautions: Knee Required Braces or Orthoses: Knee Immobilizer - Left Restrictions Weight Bearing Restrictions: Yes LLE Weight Bearing: Weight bearing as tolerated   Pertinent Vitals/Pain C/o pain but does not rate     Mobility  Bed Mobility Bed Mobility: Supine to Sit;Sitting - Scoot to Edge of Bed;Sit to Supine Supine to Sit: 3: Mod assist;With rails Sitting - Scoot to Edge of Bed: 4: Min assist;With rail Sit to Supine: 3: Mod assist;HOB flat;With rail Details for Bed Mobility Assistance: (A) to elevate LE and trunk OOB and (A) to elevate LE into bed. Transfers Transfers: Sit to Stand;Stand to Sit Sit to Stand: 1: +2 Total assist;From bed;From chair/3-in-1 Sit to Stand: Patient Percentage: 50% Stand to Sit: 1: +2 Total assist;To toilet;To bed Stand to Sit: Patient Percentage: 60% Details for Transfer Assistance: Increase (A) needed to initiate transfer and slowly descend to bed/3n1.  Max cues for hand and LE placement Ambulation/Gait Ambulation/Gait Assistance: 1: +2 Total assist Ambulation/Gait: Patient Percentage: 60% Ambulation Distance (Feet): 5  Feet Assistive device: Rolling walker Ambulation/Gait Assistance Details: +2 (A) for safety with max cues for step sequence and upright posture Gait Pattern: Step-to pattern;Decreased step length - right;Decreased stance time - left;Decreased weight shift to left Gait velocity: very slow Wheelchair Mobility Wheelchair Mobility: No    Exercises Total Joint Exercises Ankle Circles/Pumps: AROM;10 reps;Both Quad Sets: AROM;Left;5 reps Heel Slides: AAROM;Left;5 reps Hip ABduction/ADduction: Strengthening;Left;5 reps Straight Leg Raises: Strengthening;Left;5 reps   PT Diagnosis: Acute pain;Abnormality of gait;Generalized weakness;Difficulty walking  PT Problem List: Decreased strength;Decreased range of motion;Decreased mobility;Pain;Decreased activity tolerance;Decreased knowledge of use of DME PT Treatment Interventions: Therapeutic exercise;Therapeutic activities;Gait training;Functional mobility training;Patient/family education;DME instruction   PT Goals Acute Rehab PT Goals PT Goal Formulation: With patient Time For Goal Achievement: 10/28/11 Potential to Achieve Goals: Good Pt will go Supine/Side to Sit: with modified independence PT Goal: Supine/Side to Sit - Progress: Progressing toward goal Pt will go Sit to Supine/Side: with modified independence PT Goal: Sit to Supine/Side - Progress: Progressing toward goal Pt will go Sit to Stand: with modified independence PT Goal: Sit to Stand - Progress: Progressing toward goal Pt will go Stand to Sit: with modified independence PT Goal: Stand to Sit - Progress: Progressing toward goal Pt will Ambulate: 51 - 150 feet;with rolling walker PT Goal: Ambulate - Progress: Progressing toward goal Pt will Perform Home Exercise Program: with supervision, verbal cues required/provided PT Goal: Perform Home Exercise Program - Progress: Progressing toward goal  Visit Information  Last PT Received On: 10/21/11 Assistance Needed: +2     Subjective Data  Subjective: "Its hurting more this afternoon." Patient Stated Goal: To go home   Cognition  Overall Cognitive Status: Appears within  functional limits for tasks assessed/performed Arousal/Alertness: Awake/alert Orientation Level: Appears intact for tasks assessed Behavior During Session: [facility] for tasks performed    Balance     End of Session PT - End of Session Equipment Utilized During Treatment: Gait belt;Left knee immobilizer Activity Tolerance: Patient limited by pain Patient left: in bed;with call bell/phone within reach;with family/visitor present Nurse Communication: Mobility status   GP     Brinae Woods 10/21/2011, 3:13 PM Jake Shark, PT DPT [phone]

## 2011-10-21 NOTE — Progress Notes (Signed)
Subjective: C/o severe knee pain.  meds not helping.  Some epigastric discomfort this morning.  RN did an EKG.     Objective: Vital signs in last 24 hours: Temp:  [97.2 F (36.2 C)-97.9 F (36.6 C)] 97.9 F (36.6 C) (09/05 0523) Pulse Rate:  [71-90] 90  (09/05 0712) Resp:  [10-27] 16  (09/05 0523) BP: (109-156)/(53-108) 140/79 mmHg (09/05 0712) SpO2:  [49 %-100 %] 98 % (09/05 0712) Weight:  [96.1 kg (211 lb 13.8 oz)] 96.1 kg (211 lb 13.8 oz) (09/04 1535)  Intake/Output from previous day: 09/04 0701 - 09/05 0700 In: 2899.5 [I.V.:2349.5; IV Piggyback:50] Out: 2425 [Urine:2275; Drains:150] Intake/Output this shift:     Basename 10/21/11 0715  HGB 8.0*    Basename 10/21/11 0715  WBC 10.5  RBC 3.18*  HCT 25.3*  PLT 316    Basename 10/21/11 0715  NA 133*  K 3.9  CL 102  CO2 22  BUN 8  CREATININE 0.89  GLUCOSE 128*  CALCIUM 8.5    Basename 10/21/11 0715 10/20/11 0843  LABPT -- --  INR 1.09 1.02    Exam:  Dressing c/d/i.  Calf nt, nvi.    EKG:  Normal sinus rhythm with nonspecific t wave abnormality.    Assessment/Plan: Acute on chronic anemia.  Will transfuse 2 units prbc's today.   Increase norco to 10/325.   PT today.     Lemarcus Baggerly M 10/21/2011, 9:09 AM

## 2011-10-21 NOTE — Progress Notes (Signed)
Physical Therapy Evaluation Patient Details Name: [name] MRN: [id number] DOB: 1940-02-24 Today's Date: 10/21/2011 Time: 7829-5621 PT Time Calculation (min): 27 min  PT Assessment / Plan / Recommendation Clinical Impression  Pt is 71 y.o. female admitted for s/p L TKA. Pt would benefit from acute PT services due to decreased strength, ROM, and endurance.    PT Assessment  Patient needs continued PT services    Follow Up Recommendations  Home health PT    Barriers to Discharge None Pt has supportive caregivers and is motivated to participate in PT.    Equipment Recommendations  Rolling walker with 5" wheels    Recommendations for Other Services     Frequency 7X/week    Precautions / Restrictions Precautions Precautions: Knee Required Braces or Orthoses: Knee Immobilizer - Left Restrictions Weight Bearing Restrictions: Yes LLE Weight Bearing: Weight bearing as tolerated   Pertinent Vitals/Pain 7/10 L LE      Mobility  Bed Mobility Bed Mobility: Supine to Sit;Sitting - Scoot to Edge of Bed Supine to Sit: 4: Min assist;HOB elevated;With rails Sitting - Scoot to Edge of Bed: 4: Min assist;With rail Details for Bed Mobility Assistance: (A) with lowering L LE OOB and use of hand rail at [facility] Cues for hand placement and LE placement. Transfers Transfers: Sit to Stand;Stand to Sit Sit to Stand: 4: Min assist;From bed Stand to Sit: 4: Min assist;To chair/3-in-1 Details for Transfer Assistance: (A) with cues for hand placement and proper technique. Ambulation/Gait Ambulation/Gait Assistance: 4: Min assist Ambulation Distance (Feet): 10 Feet Assistive device: Rolling walker Ambulation/Gait Assistance Details:   (A) with tactile and manual cues for Left LE due to weakness and Left knee buckling. Verbal cues for proper technique.  Gait Pattern: Step-to pattern;Decreased step length - right;Decreased stance time - left;Decreased weight shift to left Gait velocity: very  slow Wheelchair Mobility Wheelchair Mobility: No    Exercises Total Joint Exercises Ankle Circles/Pumps: AROM;10 reps;Both Quad Sets: AROM;Left;5 reps Heel Slides: AAROM;Left;5 reps   PT Diagnosis: Acute pain;Abnormality of gait;Generalized weakness;Difficulty walking  PT Problem List: Decreased strength;Decreased range of motion;Decreased mobility;Pain;Decreased activity tolerance;Decreased knowledge of use of DME PT Treatment Interventions: Therapeutic exercise;Therapeutic activities;Gait training;Functional mobility training;Patient/family education;DME instruction   PT Goals Acute Rehab PT Goals PT Goal Formulation: With patient Time For Goal Achievement: 10/28/11 Potential to Achieve Goals: Good Pt will go Supine/Side to Sit: with modified independence PT Goal: Supine/Side to Sit - Progress: Goal set today Pt will go Sit to Supine/Side: with modified independence PT Goal: Sit to Supine/Side - Progress: Goal set today Pt will go Sit to Stand: with modified independence PT Goal: Sit to Stand - Progress: Goal set today Pt will go Stand to Sit: with modified independence PT Goal: Stand to Sit - Progress: Goal set today Pt will Ambulate: 51 - 150 feet;with rolling walker PT Goal: Ambulate - Progress: Goal set today Pt will Perform Home Exercise Program: with supervision, verbal cues required/provided PT Goal: Perform Home Exercise Program - Progress: Goal set today  Visit Information  Last PT Received On: 10/21/11 Assistance Needed: +1    Subjective Data  Subjective: "Feeling much better now that pain is under control" Patient Stated Goal: To go home   Prior Functioning  Home Living Lives With: Spouse Available Help at Discharge: Family Type of Home: House Home Access: Level entry Home Layout: One level Bathroom Shower/Tub: Forensic scientist: Standard Bathroom Accessibility: Yes How Accessible: Accessible via walker Home Adaptive Equipment:  Bedside commode/3-in-1;Straight  cane;Walker - rolling Prior Function Level of Independence: Independent with assistive device(s) (uses RW) Able to Take Stairs?: No Driving: No Vocation: Retired Musician: No difficulties Dominant Hand: Right    Cognition  Overall Cognitive Status: Appears within functional limits for tasks assessed/performed Arousal/Alertness: Awake/alert Orientation Level: Appears intact for tasks assessed Behavior During Session: [facility] for tasks performed    Extremity/Trunk Assessment Left Lower Extremity Assessment LLE ROM/Strength/Tone: Due to pain;Deficits LLE ROM/Strength/Tone Deficits: Decreased due to pain and swelling; limited knee flexion AAROM 5-25 degrees   Balance    End of Session PT - End of Session Equipment Utilized During Treatment: Gait belt;Left knee immobilizer Activity Tolerance: Patient tolerated treatment well Patient left: in chair;with call bell/phone within reach;with family/visitor present Nurse Communication: Mobility status  GP     Yves Fodor 10/21/2011, 12:17 PM Amy DiTommaso, SPT  [place], PT DPT [phone]

## 2011-10-21 NOTE — Op Note (Signed)
[name], BRADDY NO.:  [id number]  MEDICAL RECORD NO.:  [id number]  LOCATION:  [id number]                        FACILITY:  [facility]  PHYSICIAN:  Loreta Ave, M.D. DATE OF BIRTH:  Apr 16, 1940  DATE OF PROCEDURE:  10/20/2011 DATE OF DISCHARGE:                              OPERATIVE REPORT   PREOPERATIVE DIAGNOSES:  Left knee end-stage degenerative arthritis. Marked underlying osteopenia.  Fixed valgus and flexion contracture.  POSTOPERATIVE DIAGNOSES:  Left knee end-stage degenerative arthritis. Marked underlying osteopenia.  Fixed valgus and flexion contracture.  PROCEDURE:  Left knee modified minimally invasive total knee replacement, Stryker triathlon prosthesis.  Soft tissue balancing.  A cemented pegged posterior stabilized #3 femoral component.  Cemented #4 tibial component with a 12 x 100 mm distal stem.  A 9 mm polyethylene insert.  Cemented resurfacing 32 mm patellar component.  SURGEON:  Loreta Ave, M.D.  ASSISTANT:  Genene Churn. Barry Dienes, [place], present throughout the entire case and necessary for timely completion of procedure.  ANESTHESIA:  General.  BLOOD LOSS:  Minimal.  SPECIMENS:  None.  CULTURES:  None.  COMPLICATIONS:  None.  DRESSINGS:  Soft compressive knee immobilizer.  DRAINS:  Hemovac x1.  TOURNIQUET TIME:  1 hour.  PROCEDURE:  The patient was brought to the operating room, placed on the operating table in a supine position.  After adequate level of anesthesia had been obtained, left knee examined.  A fixed 12-degree at least valgus contracture.  A 7-8 degree flexion contracture.  Further flexion barely 80 degrees.  Ligament stable.  Tourniquet applied. Prepped and draped in usual sterile fashion.  Exsanguinated with elevation, Esmarch.  Tourniquet inflated to 350 mmHg.  Straight incision above the patella down to the tibial tubercle.  Hemostasis with cautery. Medial arthrotomy, vastus splitting, preserving quad tendon.   Knee exposed.  Extensive spur throughout the entire knee, all compartments debrided.  Remnants of menisci, cruciate ligaments excised.  Distal femur exposed.  Marked bone loss lateral compartment both on the femur and tibia.  Very osteopenic on the medial side especially on the tibia. Intramedullary guide on the femur.  10-mm resection, 5 degrees of valgus.  Using the epicondylar axis, drilled, sized, and fitted for a pegged #3 component.  Proximal tibia exposed.  Extramedullary guide 0- degree cut down to the defect laterally.  Debris cleared throughout the knee.  Exuberant spurs in all compartments especially laterally, all debrided out.  Internal scarring and adhesions also debrided out. Patella exposed.  Spurs removed.  Posterior 10 mm removed.  Drilled, sized, and fitted for a 32 mm component.  When this was all complete, trials were put in place.  With removal of all of the spurs and internal scarring and readjustment of her knee with appropriate bone cuts, I was very pleased with reestablishment of a straight knee without flexion contracture, and a normal valgus position without contracture utilizing #3 on the femur, #4 on the tibia, and a 9-mm insert as well as 32 patella.  Surprisingly her patellofemoral tracking was also quite good. I did not have to do any further soft tissue releases of the lateral structures of the lateral retinaculum.  The tibia was marked  for rotation and hand reamed.  I also added a centralizing stem in the tibia because of her degree of underlying osteopenia.  Remaining sclerotic bone on the lateral tibial plateau treated with multiple drilling. Copious irrigation of the entire knee then complete.  Cement prepared and placed on all components.  All were firmly seated.  Polyethylene attached to tibia, knee reduced.  Patella held with a clamp.  Once the cement hardened, the knee was reassessed.  Again I was pleased with full extension, full flexion, good  alignment, good stability, good biomechanical axis.  Nicely balanced in flexion/extension with good patellar tracking confirmed.  Hemovac was placed and brought out through a separate stab wound.  Knee irrigated once again.  Arthrotomy closed with #1 Vicryl.  Skin and subcutaneous tissue with Vicryl and staples. Sterile compressive dressing applied.  Tourniquet deflated and removed. Knee immobilizer applied.  Anesthesia reversed.  Brought to the recovery room.  Tolerated surgery well.  No complications.     Loreta Ave, M.D.     DFM/MEDQ  D:  10/20/2011  T:  10/21/2011  Job:  [id number]

## 2011-10-21 NOTE — Clinical Social Work Placement (Addendum)
    Clinical Social Work Department CLINICAL SOCIAL WORK PLACEMENT NOTE 10/21/2011  Patient:  [name], [name]  Account Number:  [id number] Admit date:  10/20/2011  Clinical Social Worker:  Lupita Leash Wania Longstreth, BSW  Date/time:  10/21/2011 02:47 PM  Clinical Social Work is seeking post-discharge placement for this patient at the following level of care:   SKILLED NURSING   (*CSW will update this form in Epic as items are completed)   10/21/2011  Patient/family provided with [facility] Department of Clinical Social Work's list of facilities offering this level of care within the geographic area requested by the patient (or if unable, by the patient's family).  10/21/2011  Patient/family informed of their freedom to choose among providers that offer the needed level of care, that participate in Medicare, Medicaid or managed care program needed by the patient, have an available bed and are willing to accept the patient.  10/21/2011  Patient/family informed of [facility]' ownership interest in [facility], as well as of the fact that they are under no obligation to receive care at this facility.  PASARR submitted to EDS on 10/22/11 PASARR number received from EDS on 10/22/11  FL2 transmitted to all facilities in geographic area requested by pt/family on 10/22/11  FL2 transmitted to all facilities within larger geographic area on   Patient informed that his/her managed care company has contracts with or will negotiate with  certain facilities, including the following:    [facility]     Patient/family informed of bed offers received:  [facility] Patient chooses bed at  [facility] Physician recommends and patient chooses bed at  [facility]  Patient to be transferred to [facility] on  10/22/11 Patient to be transferred to facility by Ambulance  The following physician request were entered in Epic:   Additional Comments: Patient, husband and sister are extremely pleased with d/c plan.   Notified SNF and patient's nurse of d/c plan.  Lorri Frederick. [place]  [phone]

## 2011-10-21 NOTE — Progress Notes (Addendum)
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 Fentanyl Itching    Patient just started the patch on 08-08-2011 and she has noted itching, but states that it is helping.  . Sulfonamide Derivatives Hives  . Codeine Itching and Rash    Patient Measurements: Height: 5\' 7"  (170.2 cm) Weight: 211 lb 13.8 oz (96.1 kg) IBW/kg (Calculated) : 61.6    Vital Signs: Temp: 97.9 F (36.6 C) (09/05 0523) BP: 140/79 mmHg (09/05 0712) Pulse Rate: 90  (09/05 0712)  Labs:  Basename 10/21/11 0715 10/20/11 0843  HGB 8.0* --  HCT 25.3* --  PLT 316 --  APTT -- 27  LABPROT 14.3 13.6  INR 1.09 1.02  HEPARINUNFRC -- --  CREATININE 0.89 --  CKTOTAL -- --  CKMB -- --  TROPONINI -- --    Estimated Creatinine Clearance: 68 ml/min (by C-G formula based on Cr of 0.89).   Medical History: Past Medical History  Diagnosis Date  . Hyperlipidemia     takes Pravastatin daily  . Overweight   . Depression   . Migraine   . Degenerative joint disease     Knees  . Glaucoma   . Pulmonary embolism May 2012    acute presentation, bilateral PE  . Chronic anticoagulation 07/21/2010  . Anemia, iron deficiency 2012    Evaluated by Dr. Darrick Penna; H&H of 9.3/30.8 with MCV-79 in 10/2010; 3/3 positive Hemoccult cards in 07/2011  . Atrial fibrillation     takes Coumadin daily  . Hypertension     takes Hyzaar daily and Propranolol   . Pneumonia 1969    hx of  . Hx of migraines     last one about a yr ago;takes Topamax daily  . Joint pain   . Joint swelling   . Chronic back pain     related to knee pain  . History of gout     was on medication but taken off of several months ago  . Bruises easily     takes Coumadin  . Skin spots-aging   . Gastroesophageal reflux disease     takes Omeprazole daily  . Hx of colonic polyps   . Urinary frequency   . Nocturia   . History of blood transfusion 1969  .  Diabetes mellitus     takes Metformin daily  . Glaucoma     uses eye drops at night  . Anxiety     was on Prozac for a couple of weeks  . Insomnia     takes Restoril prn    Medications:  Prescriptions prior to admission  Medication Sig Dispense Refill  . amLODipine (NORVASC) 10 MG tablet Take 1 tablet (10 mg total) by mouth daily.  90 tablet  1  . Enoxaparin Sodium (LOVENOX [place]) Inject 150 mg into the skin.      . Fiber POWD Take 1 scoop by mouth daily as needed. For constipation      . furosemide (LASIX) 40 MG tablet Take 20-40 mg by mouth daily as needed. Take one half to one tablet by mouth once daily as needed for fluid retention      . HYDROcodone-acetaminophen (NORCO) 5-325 MG per tablet Take 1 tablet by mouth every 6 (six) hours as needed. for severe pain      . imipramine (TOFRANIL) 50 MG tablet Take 100 mg by mouth at bedtime.       Marland Kitchen  latanoprost (XALATAN) 0.005 % ophthalmic solution Place 1 drop into both eyes at bedtime.       Marland Kitchen losartan-hydrochlorothiazide (HYZAAR) 50-12.5 MG per tablet Take 1 tablet by mouth daily.  90 tablet  1  . metFORMIN (GLUCOPHAGE) 500 MG tablet Take 1 tablet (500 mg total) by mouth 2 (two) times daily with a meal.  180 tablet  1  . omeprazole (PRILOSEC) 20 MG capsule Take 1 capsule (20 mg total) by mouth 2 (two) times daily.  60 capsule  5  . polyethylene glycol powder (GLYCOLAX/MIRALAX) powder Take 17 g by mouth daily as needed. For constipation      . pravastatin (PRAVACHOL) 20 MG tablet Take 1 tablet (20 mg total) by mouth every evening.  90 tablet  1  . propranolol (INDERAL) 80 MG tablet Take 1 tablet (80 mg total) by mouth daily.  90 tablet  1  . temazepam (RESTORIL) 15 MG capsule Take 15-30 mg by mouth at bedtime as needed. For sleep      . topiramate (TOPAMAX) 100 MG tablet Take 1 tablet (100 mg total) by mouth 2 (two) times daily.  180 tablet  1  . warfarin (COUMADIN) 7.5 MG tablet Take 3.75-7.5 mg by mouth See admin instructions. Half a tablet  everyday except Monday and Thursday take 1 tablet.        Assessment: 71 y.o. F admitted on 9/4 for planned L-TKA. PTA the patient was on chronic warfarin for hx PE with a dose of 3.75 mg daily EXCEPT for 7.5 mg on Mon/Thursday. The last dose before admission was on 8/29 as it was held prior to planned surgery. The patient is now post-op and pharmacy has been consulted to resume warfarin therapy.  INR remains SUBtherapeutic (1.09, goal of 2-3). Hg 8.0 The patient is also concurrently on Lovenox 30 mg every 12 hours until INR >/= 1.8.   Goal of Therapy:  INR 2-3   Plan:  1. Repeat warfarin 7.5 mg x 1 dose at 1800 today 2. Daily PT/INR 3. If patient considered high risk due to history of PE -- consider full-dose bridging with lovenox.  4. Will continue to monitor for any signs/symptoms of bleeding and will follow up with PT/INR in the a.m.   Talbert Cage, PharmD Clinical Pharmacist Pager: [phone] 10/21/2011 9:24 AM    Addum:  Will start full dose lovenox until INR therapeutic.  Start lovenox 100 mg sq q12 hours.

## 2011-10-22 ENCOUNTER — Inpatient Hospital Stay
Admission: RE | Admit: 2011-10-22 | Discharge: 2011-11-10 | Disposition: A | Discharge status: Home / Self Care | Payer: Medicare Other | Source: Ambulatory Visit | Attending: Internal Medicine | Admitting: Internal Medicine

## 2011-10-22 DIAGNOSIS — K625 Hemorrhage of anus and rectum: Principal | ICD-10-CM

## 2011-10-22 LAB — TYPE AND SCREEN
ABO/RH(D): AB POS
Antibody Screen: NEGATIVE
Unit division: 0

## 2011-10-22 LAB — PROTIME-INR: Prothrombin Time: 18.6 seconds — ABNORMAL HIGH (ref 11.6–15.2)

## 2011-10-22 LAB — CBC
MCHC: 33.2 g/dL (ref 30.0–36.0)
Platelets: 265 10*3/uL (ref 150–400)
RDW: 15.5 % (ref 11.5–15.5)
WBC: 13 10*3/uL — ABNORMAL HIGH (ref 4.0–10.5)

## 2011-10-22 MED ORDER — METHOCARBAMOL 500 MG PO TABS: 500.0000 mg | ORAL_TABLET | Freq: Four times a day (QID) | ORAL | Status: AC | PRN

## 2011-10-22 MED ORDER — ENOXAPARIN SODIUM 100 MG/ML ~~LOC~~ SOLN: 100.0000 mg | Freq: Two times a day (BID) | SUBCUTANEOUS | Status: DC

## 2011-10-22 MED ORDER — WARFARIN SODIUM 2.5 MG PO TABS
3.7500 mg | ORAL_TABLET | Freq: Once | ORAL | Status: DC
  Filled 2011-10-22: qty 1

## 2011-10-22 MED ORDER — HYDROCODONE-ACETAMINOPHEN 10-325 MG PO TABS: 1.0000 | ORAL_TABLET | ORAL | Status: AC | PRN

## 2011-10-22 NOTE — Progress Notes (Signed)
CARE MANAGEMENT NOTE 10/22/2011  Patient:  [name], [name]   Account Number:  [id number]  Date Initiated:  10/22/2011  Documentation initiated by:  Vance Peper  Subjective/Objective Assessment:   71 yr old female s/p left total knee arthroplasty     Action/Plan:   CM spoke with patient and family regarding HH needs. Preoperatively setup with [facility], but patient will go to [facility] for shortterm rehab.   Anticipated DC Date:  10/22/2011   Anticipated DC Plan:  SKILLED NURSING FACILITY  In-house referral  Clinical Social Worker      DC Planning Services  CM consult      Choice offered to / List presented to:             [facility] agency  [facility]   Status of service:  Completed, signed off Medicare Important Message given?   (If response is "NO", the following Medicare IM given date fields will be blank) Date Medicare IM given:   Date Additional Medicare IM given:    Discharge Disposition:  SKILLED NURSING FACILITY  Per UR Regulation:    If discussed at Long Length of Stay Meetings, dates discussed:    Comments:

## 2011-10-22 NOTE — Progress Notes (Signed)
Physical Therapy Treatment Patient Details Name: [name] MRN: [id number] DOB: [birth year] Today's Date: 10/22/2011 Time: 0925-1012 PT Time Calculation (min): 47 min  PT Assessment / Plan / Recommendation Comments on Treatment Session  Pt with urine incontinence and needed extra time to perform toliet hygiene and change clothing with total (A).  Pt continues to need max cues for proper technique with all mobility and ambulation.  At this time agree with family and pt will benefit from SNF.    Follow Up Recommendations  Skilled nursing facility    Barriers to Discharge        Equipment Recommendations  Rolling walker with 5" wheels    Recommendations for Other Services    Frequency 7X/week   Plan Discharge plan needs to be updated;Frequency remains appropriate    Precautions / Restrictions Precautions Precautions: Knee Required Braces or Orthoses: Knee Immobilizer - Left Restrictions Weight Bearing Restrictions: Yes LLE Weight Bearing: Weight bearing as tolerated   Pertinent Vitals/Pain 5/10 left knee pain    Mobility  Bed Mobility Bed Mobility: Supine to Sit;Sitting - Scoot to Edge of Bed;Sit to Supine Supine to Sit: 4: Min assist;With rails;HOB elevated Sitting - Scoot to [place] of Bed: 4: Min assist;With rail Details for Bed Mobility Assistance: (A) with left LE OOB with cues for proper technique Transfers Transfers: Sit to Stand;Stand to Sit Sit to Stand: 3: Mod assist;From bed;From chair/3-in-1 Stand to Sit: 3: Mod assist Details for Transfer Assistance: (A) to initiate transfer with cues for hand and LE placement Ambulation/Gait Ambulation/Gait Assistance: 1: +2 Total assist Ambulation/Gait: Patient Percentage: 60% Ambulation Distance (Feet): 5 Feet (x2) Assistive device: Rolling walker Ambulation/Gait Assistance Details: +2 (A) for safety with max cues for step sequence.  Gait Pattern: Step-to pattern;Decreased step length - right;Decreased stance time -  left;Decreased weight shift to left Gait velocity: very slow    Exercises Total Joint Exercises Ankle Circles/Pumps: AROM;10 reps;Both Quad Sets: AROM;Left;5 reps Heel Slides: AAROM;Left;5 reps Hip ABduction/ADduction: Strengthening;Left;5 reps   PT Diagnosis:    PT Problem List:   PT Treatment Interventions:     PT Goals Acute Rehab PT Goals PT Goal Formulation: With patient Time For Goal Achievement: 10/28/11 Potential to Achieve Goals: Good Pt will go Supine/Side to Sit: with modified independence PT Goal: Supine/Side to Sit - Progress: Progressing toward goal Pt will go Sit to Supine/Side: with modified independence PT Goal: Sit to Supine/Side - Progress: Progressing toward goal Pt will go Sit to Stand: with modified independence PT Goal: Sit to Stand - Progress: Progressing toward goal Pt will go Stand to Sit: with modified independence PT Goal: Stand to Sit - Progress: Progressing toward goal Pt will Ambulate: 51 - 150 feet;with rolling walker PT Goal: Ambulate - Progress: Progressing toward goal Pt will Perform Home Exercise Program: with supervision, verbal cues required/provided PT Goal: Perform Home Exercise Program - Progress: Progressing toward goal  Visit Information  Last PT Received On: 10/22/11 Assistance Needed: +2    Subjective Data  Subjective: "I have to urinate a lot." Patient Stated Goal: To eventually go home; family goal is SNF.   Cognition  Overall Cognitive Status: Appears within functional limits for tasks assessed/performed Arousal/Alertness: Awake/alert Orientation Level: Appears intact for tasks assessed Behavior During Session: [facility] for tasks performed    Balance     End of Session PT - End of Session Equipment Utilized During Treatment: Gait belt;Left knee immobilizer Activity Tolerance: Patient limited by pain Patient left: in chair;with call bell/phone within  reach;with family/visitor present Nurse Communication: Mobility status    GP     Jem Castro 10/22/2011, 1:35 PM Jake Shark, PT DPT [phone]

## 2011-10-22 NOTE — Clinical Social Work Psychosocial (Addendum)
    Clinical Social Work Department BRIEF PSYCHOSOCIAL ASSESSMENT 10/22/2011  Patient:  [name], [name]     Account Number:  [id number]     Admit date:  10/20/2011  Clinical Social Worker:  Burnard Hawthorne  Date/Time:  10/21/2011 03:00 PM  Referred by:  RN  Date Referred:  10/21/2011 Referred for  SNF Placement   Other Referral:   Interview type:  Other - See comment Other interview type:   Patient, sister and husband    PSYCHOSOCIAL DATA Living Status:  HUSBAND Admitted from facility:   Level of care:   Primary support name:  Dollene Primrose Primary support relationship to patient:  SPOUSE Degree of support available:   Very strong support    CURRENT CONCERNS Current Concerns  Post-Acute Placement   Other Concerns:    SOCIAL WORK ASSESSMENT / PLAN Met with patient, husband and sister this afternoon. Patient had planned to go home but today decided to seek SNF placement. Physical Therapy is in agreement to this for short term rehab. Patient lives in [place] and hopes to be placed in [place].Second choice would be [facility].  FL2 placed on chart. Will initiate bed search.  Anticipate d/c tomorrow if stable per MD.   Assessment/plan status:  Psychosocial Support/Ongoing Assessment of Needs Other assessment/ plan:   Information/referral to community resources:   SNF bed list provided  Aftercare needs discussed- possible HH/DME to be arranged by SNF    PATIENT'S/FAMILY'S RESPONSE TO PLAN OF CARE: Patient is alert, oriented and very pleasant. She is hopeful for placement in [place]- first choice is [facility] but they do not accept her insurance [facility]).  Next choice is [facility] and then [facility]. Husband is in agreement with placement.

## 2011-10-22 NOTE — Discharge Summary (Signed)
Physician Discharge Summary  Patient ID: [name] MRN: [id number] DOB/AGE: Nov 08, 1940 71 y.o.  Admit date: 10/20/2011 Discharge date: 10/22/2011  Admission Diagnoses:  End stage djd left knee  Discharge Diagnoses:  S/p left total knee replacement  Discharged Condition: stable  Hospital Course:   71 yo bf underwent left total knee replacement 20 Oct 2011.  Tolerated procedure well without complications.  Protocol lovenox and coumadin started for dvt prophylaxis.  5 sep pt given transfusion of 2 units prbc's for acute on chronic blood loss anemia.  Pain meds changed with good relief.  6 sep,  Doing well and ready for transfer to snf.  Wound looks good, staples intact, drain removed.    Consults: None   Discharge Exam: Blood pressure 142/72, pulse 97, temperature 98.9 F (37.2 C), temperature source Oral, resp. rate 16, height 5\' 7"  (1.702 m), weight 96.1 kg (211 lb 13.8 oz), SpO2 96.00%.   Disposition: snf  Discharge Orders    Future Appointments: Provider: Department: Dept Phone: Center:   12/29/2011 3:30 PM Kerri Perches, MD [facility] [phone] [facility]   01/11/2012 2:30 PM Malissa Hippo, MD [facility] [phone] None     Future Orders Please Complete By Expires   Diet - low sodium heart healthy      Call MD / Call 911      Comments:   If you experience chest pain or shortness of breath, CALL 911 and be transported to the hospital emergency room.  If you develope a fever above 101 F, pus (white drainage) or increased drainage or redness at the wound, or calf pain, call your surgeon's office.   Constipation Prevention      Comments:   Drink plenty of fluids.  Prune juice may be helpful.  You may use a stool softener, such as Colace (over the counter) 100 mg twice a day.  Use MiraLax (over the counter) for constipation as needed.   Increase activity slowly as tolerated      Discharge instructions      Comments:   Ok to shower, but no tub  soaking.  Do not apply any creams or ointments to incision.  Continue physical therapy protocol.   Driving restrictions      Comments:   No driving until further notice.   CPM      Comments:   Continuous passive motion machine (CPM):      Use the CPM from 0 to 70 degrees for 6-8 hours per day.      You may increase by 10 degrees per day as tolerated.  You may break it up into 2 or 3 sessions per day.      Use CPM for 3-4 weeks or until you are told to stop.   TED hose      Comments:   Use stockings (TED hose) for 3-4 weeks on both leg(s).  You may remove them at night for sleeping.   Change dressing      Comments:   Change dressing on left knee daily with sterile 4 x 4 inch gauze dressing and apply TED hose.   Do not put a pillow under the knee. Place it under the heel.        Medication List  As of 10/22/2011 12:14 PM   STOP taking these medications         HYDROcodone-acetaminophen 5-325 MG per tablet         TAKE these medications  amLODipine 10 MG tablet   Commonly known as: NORVASC   Take 1 tablet (10 mg total) by mouth daily.      enoxaparin 100 MG/ML injection   Commonly known as: LOVENOX   Inject 1 mL (100 mg total) into the skin every 12 (twelve) hours.      Fiber Powd   Take 1 scoop by mouth daily as needed. For constipation      furosemide 40 MG tablet   Commonly known as: LASIX   Take 20-40 mg by mouth daily as needed. Take one half to one tablet by mouth once daily as needed for fluid retention      HYDROcodone-acetaminophen 10-325 MG per tablet   Commonly known as: NORCO   Take 1-2 tablets by mouth every 4 (four) hours as needed for pain.      imipramine 50 MG tablet   Commonly known as: TOFRANIL   Take 100 mg by mouth at bedtime.      latanoprost 0.005 % ophthalmic solution   Commonly known as: XALATAN   Place 1 drop into both eyes at bedtime.      losartan-hydrochlorothiazide 50-12.5 MG per tablet   Commonly known as: HYZAAR   Take 1  tablet by mouth daily.      metFORMIN 500 MG tablet   Commonly known as: GLUCOPHAGE   Take 1 tablet (500 mg total) by mouth 2 (two) times daily with a meal.      methocarbamol 500 MG tablet   Commonly known as: ROBAXIN   Take 1 tablet (500 mg total) by mouth every 6 (six) hours as needed.      omeprazole 20 MG capsule   Commonly known as: PRILOSEC   Take 1 capsule (20 mg total) by mouth 2 (two) times daily.      polyethylene glycol powder powder   Commonly known as: GLYCOLAX/MIRALAX   Take 17 g by mouth daily as needed. For constipation      pravastatin 20 MG tablet   Commonly known as: PRAVACHOL   Take 1 tablet (20 mg total) by mouth every evening.      propranolol 80 MG tablet   Commonly known as: INDERAL   Take 1 tablet (80 mg total) by mouth daily.      temazepam 15 MG capsule   Commonly known as: RESTORIL   Take 15-30 mg by mouth at bedtime as needed. For sleep      topiramate 100 MG tablet   Commonly known as: TOPAMAX   Take 1 tablet (100 mg total) by mouth 2 (two) times daily.      warfarin 7.5 MG tablet   Commonly known as: COUMADIN   Take 3.75-7.5 mg by mouth See admin instructions. Half a tablet everyday except Monday and Thursday take 1 tablet.             SignedNaida Sleight 10/22/2011, 12:14 PM

## 2011-10-22 NOTE — Progress Notes (Signed)
Subjective: Doing well.  Pain controlled.  Ready for transfer to snf.    Objective: Vital signs in last 24 hours: Temp:  [98.1 F (36.7 C)-99.9 F (37.7 C)] 98.9 F (37.2 C) (09/06 0653) Pulse Rate:  [80-97] 97  (09/06 0653) Resp:  [16-22] 16  (09/06 0800) BP: (121-149)/(68-78) 142/72 mmHg (09/06 0653) SpO2:  [94 %-97 %] 96 % (09/06 0800)  Intake/Output from previous day: 09/05 0701 - 09/06 0700 In: 742.5 [P.O.:480; I.V.:250; Blood:12.5] Out: 1175 [Urine:1050; Drains:125] Intake/Output this shift: Total I/O In: 240 [P.O.:240] Out: 250 [Urine:250]   Basename 10/22/11 0625 10/21/11 0715  HGB 10.6* 8.0*    Basename 10/22/11 0625 10/21/11 0715  WBC 13.0* 10.5  RBC 4.05 3.18*  HCT 31.9* 25.3*  PLT 265 316    Basename 10/21/11 0715  NA 133*  K 3.9  CL 102  CO2 22  BUN 8  CREATININE 0.89  GLUCOSE 128*  CALCIUM 8.5    Basename 10/22/11 0625 10/21/11 0715  LABPT -- --  INR 1.52* 1.09    Exam:  Wound looks good.  Staples intact.  Drain removed.  Calf nt, nvi.  Assessment/Plan: Transfer to snf today.     Kaysin Brock M 10/22/2011, 12:20 PM

## 2011-10-22 NOTE — Progress Notes (Signed)
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 Fentanyl Itching    Patient just started the patch on 08-08-2011 and she has noted itching, but states that it is helping.  . Sulfonamide Derivatives Hives  . Codeine Itching and Rash    Patient Measurements: Height: 5\' 7"  (170.2 cm) Weight: 211 lb 13.8 oz (96.1 kg) IBW/kg (Calculated) : 61.6    Vital Signs: Temp: 98.9 F (37.2 C) (09/06 0653) BP: 142/72 mmHg (09/06 0653) Pulse Rate: 97  (09/06 0653)  Labs:  Basename 10/22/11 0625 10/21/11 0715 10/20/11 0843  HGB 10.6* 8.0* --  HCT 31.9* 25.3* --  PLT 265 316 --  APTT -- -- 27  LABPROT 18.6* 14.3 13.6  INR 1.52* 1.09 1.02  HEPARINUNFRC -- -- --  CREATININE -- 0.89 --  CKTOTAL -- -- --  CKMB -- -- --  TROPONINI -- -- --    Estimated Creatinine Clearance: 68 ml/min (by C-G formula based on Cr of 0.89).   Medical History: Past Medical History  Diagnosis Date  . Hyperlipidemia     takes Pravastatin daily  . Overweight   . Depression   . Migraine   . Degenerative joint disease     Knees  . Glaucoma   . Pulmonary embolism May 2012    acute presentation, bilateral PE  . Chronic anticoagulation 07/21/2010  . Anemia, iron deficiency 2012    Evaluated by Dr. Darrick Penna; H&H of 9.3/30.8 with MCV-79 in 10/2010; 3/3 positive Hemoccult cards in 07/2011  . Atrial fibrillation     takes Coumadin daily  . Hypertension     takes Hyzaar daily and Propranolol   . Pneumonia 1969    hx of  . Hx of migraines     last one about a yr ago;takes Topamax daily  . Joint pain   . Joint swelling   . Chronic back pain     related to knee pain  . History of gout     was on medication but taken off of several months ago  . Bruises easily     takes Coumadin  . Skin spots-aging   . Gastroesophageal reflux disease     takes Omeprazole daily  . Hx of colonic polyps   .  Urinary frequency   . Nocturia   . History of blood transfusion 1969  . Diabetes mellitus     takes Metformin daily  . Glaucoma     uses eye drops at night  . Anxiety     was on Prozac for a couple of weeks  . Insomnia     takes Restoril prn    Medications:  Prescriptions prior to admission  Medication Sig Dispense Refill  . amLODipine (NORVASC) 10 MG tablet Take 1 tablet (10 mg total) by mouth daily.  90 tablet  1  . Enoxaparin Sodium (LOVENOX [place]) Inject 150 mg into the skin.      . Fiber POWD Take 1 scoop by mouth daily as needed. For constipation      . furosemide (LASIX) 40 MG tablet Take 20-40 mg by mouth daily as needed. Take one half to one tablet by mouth once daily as needed for fluid retention      . HYDROcodone-acetaminophen (NORCO) 5-325 MG per tablet Take 1 tablet by mouth every 6 (six) hours as needed. for severe pain      . imipramine (  TOFRANIL) 50 MG tablet Take 100 mg by mouth at bedtime.       Marland Kitchen latanoprost (XALATAN) 0.005 % ophthalmic solution Place 1 drop into both eyes at bedtime.       Marland Kitchen losartan-hydrochlorothiazide (HYZAAR) 50-12.5 MG per tablet Take 1 tablet by mouth daily.  90 tablet  1  . metFORMIN (GLUCOPHAGE) 500 MG tablet Take 1 tablet (500 mg total) by mouth 2 (two) times daily with a meal.  180 tablet  1  . omeprazole (PRILOSEC) 20 MG capsule Take 1 capsule (20 mg total) by mouth 2 (two) times daily.  60 capsule  5  . polyethylene glycol powder (GLYCOLAX/MIRALAX) powder Take 17 g by mouth daily as needed. For constipation      . pravastatin (PRAVACHOL) 20 MG tablet Take 1 tablet (20 mg total) by mouth every evening.  90 tablet  1  . propranolol (INDERAL) 80 MG tablet Take 1 tablet (80 mg total) by mouth daily.  90 tablet  1  . temazepam (RESTORIL) 15 MG capsule Take 15-30 mg by mouth at bedtime as needed. For sleep      . topiramate (TOPAMAX) 100 MG tablet Take 1 tablet (100 mg total) by mouth 2 (two) times daily.  180 tablet  1  . warfarin (COUMADIN) 7.5  MG tablet Take 3.75-7.5 mg by mouth See admin instructions. Half a tablet everyday except Monday and Thursday take 1 tablet.        Assessment: 71 y.o. F admitted on 9/4 for planned L-TKA. PTA the patient was on chronic warfarin for hx PE with a dose of 3.75 mg daily EXCEPT for 7.5 mg on Mon/Thursday. The last dose before admission was on 8/29 as it was held prior to planned surgery. The patient is now post-op and pharmacy has been consulted to resume warfarin therapy and bridging with full dose lovenox.  INR remains SUBtherapeutic (1.52, goal of 2-3). Hg 10.6 after 2 units pRBCs  Goal of Therapy:  INR 2-3   Plan:  1.Warfarin 3.75 mg x 1 dose at 1800 today 2. Cont full dose lovenox 3. Will continue to monitor for any signs/symptoms of bleeding and will follow up with PT/INR in the a.m.   Talbert Cage, PharmD Clinical Pharmacist Pager: [phone] 10/22/2011 10:07 AM

## 2011-10-22 NOTE — Progress Notes (Signed)
Pt. Transferred by ambulance to [facility]. Pt left in stable condition.

## 2011-10-23 NOTE — Progress Notes (Signed)
Eroroneous encounter

## 2011-10-24 LAB — GLUCOSE, CAPILLARY: Glucose-Capillary: 218 mg/dL — ABNORMAL HIGH (ref 70–99)

## 2011-10-25 LAB — GLUCOSE, CAPILLARY
Glucose-Capillary: 152 mg/dL — ABNORMAL HIGH (ref 70–99)
Glucose-Capillary: 145 mg/dL — ABNORMAL HIGH (ref 70–99)
Glucose-Capillary: 135 mg/dL — ABNORMAL HIGH (ref 70–99)
Glucose-Capillary: 117 mg/dL — ABNORMAL HIGH (ref 70–99)

## 2011-10-26 LAB — GLUCOSE, CAPILLARY
Glucose-Capillary: 122 mg/dL — ABNORMAL HIGH (ref 70–99)
Glucose-Capillary: 207 mg/dL — ABNORMAL HIGH (ref 70–99)

## 2011-10-27 LAB — GLUCOSE, CAPILLARY: Glucose-Capillary: 115 mg/dL — ABNORMAL HIGH (ref 70–99)

## 2011-10-28 LAB — GLUCOSE, CAPILLARY: Glucose-Capillary: 127 mg/dL — ABNORMAL HIGH (ref 70–99)

## 2011-10-30 ENCOUNTER — Ambulatory Visit (HOSPITAL_COMMUNITY): Payer: Medicare Other

## 2011-10-30 LAB — GLUCOSE, CAPILLARY: Glucose-Capillary: 264 mg/dL — ABNORMAL HIGH (ref 70–99)

## 2011-11-01 LAB — GLUCOSE, CAPILLARY: Glucose-Capillary: 132 mg/dL — ABNORMAL HIGH (ref 70–99)

## 2011-11-03 LAB — GLUCOSE, CAPILLARY: Glucose-Capillary: 132 mg/dL — ABNORMAL HIGH (ref 70–99)

## 2011-11-08 LAB — GLUCOSE, CAPILLARY: Glucose-Capillary: 108 mg/dL — ABNORMAL HIGH (ref 70–99)

## 2011-11-16 ENCOUNTER — Ambulatory Visit (INDEPENDENT_AMBULATORY_CARE_PROVIDER_SITE_OTHER): Payer: Medicare Other | Admitting: *Deleted

## 2011-11-16 DIAGNOSIS — I2699 Other pulmonary embolism without acute cor pulmonale: Secondary | ICD-10-CM

## 2011-11-16 DIAGNOSIS — Z7901 Long term (current) use of anticoagulants: Secondary | ICD-10-CM

## 2011-11-17 ENCOUNTER — Telehealth: Payer: Self-pay | Admitting: Family Medicine

## 2011-11-18 NOTE — Telephone Encounter (Signed)
All meds refilled 9/3

## 2011-11-23 ENCOUNTER — Encounter: Payer: Self-pay | Admitting: Family Medicine

## 2011-11-23 ENCOUNTER — Ambulatory Visit (INDEPENDENT_AMBULATORY_CARE_PROVIDER_SITE_OTHER): Payer: Medicare Other | Admitting: Family Medicine

## 2011-11-23 VITALS — BP 130/82 | HR 94 | Resp 15 | Ht 65.0 in | Wt 209.8 lb

## 2011-11-23 DIAGNOSIS — F411 Generalized anxiety disorder: Secondary | ICD-10-CM

## 2011-11-23 DIAGNOSIS — I10 Essential (primary) hypertension: Secondary | ICD-10-CM

## 2011-11-23 DIAGNOSIS — Z23 Encounter for immunization: Secondary | ICD-10-CM

## 2011-11-23 DIAGNOSIS — E785 Hyperlipidemia, unspecified: Secondary | ICD-10-CM

## 2011-11-23 DIAGNOSIS — E119 Type 2 diabetes mellitus without complications: Secondary | ICD-10-CM

## 2011-11-23 DIAGNOSIS — F419 Anxiety disorder, unspecified: Secondary | ICD-10-CM | POA: Insufficient documentation

## 2011-11-23 DIAGNOSIS — D509 Iron deficiency anemia, unspecified: Secondary | ICD-10-CM

## 2011-11-23 LAB — CBC WITH DIFFERENTIAL/PLATELET
Basophils Absolute: 0 10*3/uL (ref 0.0–0.1)
Basophils Relative: 0 % (ref 0–1)
Eosinophils Absolute: 0 10*3/uL (ref 0.0–0.7)
Lymphs Abs: 1.6 10*3/uL (ref 0.7–4.0)
MCH: 28 pg (ref 26.0–34.0)
MCHC: 33.1 g/dL (ref 30.0–36.0)
Neutrophils Relative %: 73 % (ref 43–77)
Platelets: 463 10*3/uL — ABNORMAL HIGH (ref 150–400)
RBC: 3.86 MIL/uL — ABNORMAL LOW (ref 3.87–5.11)
RDW: 19.2 % — ABNORMAL HIGH (ref 11.5–15.5)

## 2011-11-23 LAB — FERRITIN: Ferritin: 92 ng/mL (ref 10–291)

## 2011-11-23 LAB — BASIC METABOLIC PANEL
BUN: 10 mg/dL (ref 6–23)
Chloride: 107 mEq/L (ref 96–112)
Creat: 0.95 mg/dL (ref 0.50–1.10)

## 2011-11-23 LAB — HEMOGLOBIN A1C: Hgb A1c MFr Bld: 5.4 % (ref <5.7)

## 2011-11-23 MED ORDER — IMIPRAMINE HCL 50 MG PO TABS: 100.0000 mg | ORAL_TABLET | Freq: Every day | ORAL | Status: DC

## 2011-11-23 MED ORDER — ALPRAZOLAM 0.25 MG PO TABS: ORAL_TABLET | ORAL | Status: DC

## 2011-11-23 NOTE — Patient Instructions (Addendum)
Annual wellness in 4.5 months, please call if you need me before  I am thankful that you have done so very well with left knee replacement surgery and are doing well in rehab   HBA1C, chem 7, cbc, iron and ferritin level today.  Fasting lipid, cmp and EGFR in 4.5 months before next visit.  Flu vaccine today  New med xanax, lowest dose, half to one daily as needed, for extreme anxiety.Please try to use no more that 3 times per week, not good to take this medication for a long time

## 2011-11-23 NOTE — Progress Notes (Signed)
  Subjective:    Patient ID: [name], female    DOB: 1940/03/25, 71 y.o.   MRN: [id number]  HPI The PT is here for follow up and re-evaluation of chronic medical conditions, medication management and review of any available recent lab and radiology data.  Preventive health is updated, specifically  Cancer screening and Immunization.   She has had successful left knee replacement , and is currently at home getting physical therapy and doing very well with this. She has traumatic recall of rehab experience, however, I do believe that a lot of this is related more to mental and emotional decompensation rather than facts. States she is currently experiencing increased anxiety and would like low dos xanax The PT denies any adverse reactions to current medications since the last visit.       Review of Systems See HPI Denies recent fever or chills. Denies sinus pressure, nasal congestion, ear pain or sore throat. Denies chest congestion, productive cough or wheezing. Denies chest pains, palpitations and leg swelling Denies abdominal pain, nausea, vomiting,diarrhea or constipation.   Denies dysuria, frequency, hesitancy or incontinence. Improvement in  joint pain,  and limitation in mobility.following surgery Denies headaches, seizures, numbness, or tingling. Denies depression,has increased  anxiety denies insomnia. Denies skin break down or rash.        Objective:   Physical Exam  Patient alert and oriented and in no cardiopulmonary distress.  HEENT: No facial asymmetry, EOMI, no sinus tenderness,  oropharynx pink and moist.  Neck supple no adenopathy.  Chest: Clear to auscultation bilaterally.  CVS: S1, S2 no murmurs, no S3.  ABD: Soft non tender. Bowel sounds normal.  Ext: No edema  MS: decreased  ROM spine,nd knees.  Skin: Intact, no ulcerations or rash noted.  Psych: Good eye contact, normal affect. Memory impaired, anxious, not depresed appearing  CNS: CN 2-12  intact, power,  normal throughout. Foot exam: no ulcers or corns, pulses normal      Assessment & Plan:

## 2011-11-24 ENCOUNTER — Ambulatory Visit (INDEPENDENT_AMBULATORY_CARE_PROVIDER_SITE_OTHER): Payer: Medicare Other | Admitting: *Deleted

## 2011-11-24 ENCOUNTER — Telehealth: Payer: Self-pay | Admitting: *Deleted

## 2011-11-24 DIAGNOSIS — I2699 Other pulmonary embolism without acute cor pulmonale: Secondary | ICD-10-CM

## 2011-11-24 DIAGNOSIS — Z7901 Long term (current) use of anticoagulants: Secondary | ICD-10-CM

## 2011-11-24 NOTE — Telephone Encounter (Signed)
INR - 1.1 / please call Lyla Son with instructions / tg

## 2011-11-25 NOTE — Telephone Encounter (Signed)
See coumadin note. 

## 2011-11-26 ENCOUNTER — Ambulatory Visit (INDEPENDENT_AMBULATORY_CARE_PROVIDER_SITE_OTHER): Payer: Medicare Other | Admitting: *Deleted

## 2011-11-26 ENCOUNTER — Other Ambulatory Visit: Payer: Self-pay | Admitting: Family Medicine

## 2011-11-26 DIAGNOSIS — I2699 Other pulmonary embolism without acute cor pulmonale: Secondary | ICD-10-CM

## 2011-11-26 DIAGNOSIS — Z7901 Long term (current) use of anticoagulants: Secondary | ICD-10-CM

## 2011-11-26 LAB — POCT INR: INR: 1.9

## 2011-11-29 ENCOUNTER — Telehealth: Payer: Self-pay | Admitting: Family Medicine

## 2011-11-29 ENCOUNTER — Other Ambulatory Visit: Payer: Self-pay | Admitting: Family Medicine

## 2011-11-29 MED ORDER — IMIPRAMINE HCL 50 MG PO TABS: 100.0000 mg | ORAL_TABLET | Freq: Every day | ORAL | Status: DC

## 2011-11-29 NOTE — Telephone Encounter (Signed)
Refill sent to [facility].  

## 2011-11-29 NOTE — Assessment & Plan Note (Signed)
Controlled, no change in medication Hyperlipidemia:Low fat diet discussed and encouraged.  \ 

## 2011-11-29 NOTE — Assessment & Plan Note (Signed)
Increased following recent surgery, restricted amt of xanax short term for as needed use only

## 2011-11-29 NOTE — Assessment & Plan Note (Addendum)
F/u lab shows normal hBA1C, will d/c metformin

## 2011-11-29 NOTE — Assessment & Plan Note (Signed)
Controlled, no change in medication DASH diet and commitment to daily physical activity for a minimum of 30 minutes discussed and encouraged, as a part of hypertension management. The importance of attaining a healthy weight is also discussed.  

## 2011-12-01 ENCOUNTER — Ambulatory Visit (INDEPENDENT_AMBULATORY_CARE_PROVIDER_SITE_OTHER): Payer: Medicare Other | Admitting: *Deleted

## 2011-12-01 DIAGNOSIS — Z7901 Long term (current) use of anticoagulants: Secondary | ICD-10-CM

## 2011-12-01 DIAGNOSIS — I2699 Other pulmonary embolism without acute cor pulmonale: Secondary | ICD-10-CM

## 2011-12-20 ENCOUNTER — Ambulatory Visit (INDEPENDENT_AMBULATORY_CARE_PROVIDER_SITE_OTHER): Payer: Medicare Other | Admitting: *Deleted

## 2011-12-20 DIAGNOSIS — Z7901 Long term (current) use of anticoagulants: Secondary | ICD-10-CM

## 2011-12-20 DIAGNOSIS — I2699 Other pulmonary embolism without acute cor pulmonale: Secondary | ICD-10-CM

## 2011-12-29 ENCOUNTER — Ambulatory Visit: Payer: Medicare Other | Admitting: Family Medicine

## 2012-01-03 ENCOUNTER — Ambulatory Visit (INDEPENDENT_AMBULATORY_CARE_PROVIDER_SITE_OTHER): Payer: Medicare Other | Admitting: *Deleted

## 2012-01-03 DIAGNOSIS — I2699 Other pulmonary embolism without acute cor pulmonale: Secondary | ICD-10-CM

## 2012-01-03 DIAGNOSIS — Z7901 Long term (current) use of anticoagulants: Secondary | ICD-10-CM

## 2012-01-10 ENCOUNTER — Ambulatory Visit (INDEPENDENT_AMBULATORY_CARE_PROVIDER_SITE_OTHER): Payer: Medicare Other | Admitting: Internal Medicine

## 2012-01-10 ENCOUNTER — Encounter (INDEPENDENT_AMBULATORY_CARE_PROVIDER_SITE_OTHER): Payer: Self-pay | Admitting: Internal Medicine

## 2012-01-10 VITALS — BP 140/86 | HR 78 | Temp 97.3°F | Resp 18 | Ht 67.0 in | Wt 206.5 lb

## 2012-01-10 DIAGNOSIS — D649 Anemia, unspecified: Secondary | ICD-10-CM

## 2012-01-10 NOTE — Patient Instructions (Addendum)
Notify if he have rectal bleeding or melena. Please do not take OTC NSAIDs as discussed. CBC in 3 months. We will arrange unless done by Dr. Lodema Hong.

## 2012-01-10 NOTE — Progress Notes (Signed)
Presenting complaint;  Followup for GI bleed and iron deficiency anemia.  Subjective:  Patient is 71 year old African female patient of Dr. Anthony Sar who is here for scheduled visit. She has history of recurrent GI bleed and iron deficiency anemia. She was initially evaluated in November 2012 by Dr. Darrick Penna. No bleeding source was found on EGD and colonoscopy. She underwent small bowel given capsule study in July 2013 and fresh blood was noted and post bulbar duodenum. 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 She has not passed any blood per rectum since then. She went on to have left knee replacement on 10/20/2011. She is having formed stools daily with a MiraLax. She denies abdominal pain melena or rectal bleeding. Her heartburn is well controlled with PPI. Since surgery she's been able to ambulate better and has lost 8 pounds.  Current Medications: Current Outpatient Prescriptions  Medication Sig Dispense Refill  . ALPRAZolam (XANAX) 0.25 MG tablet One half to one tablet once daily, as needed, for anxiety.  30 tablet  1  . amLODipine (NORVASC) 10 MG tablet Take 1 tablet (10 mg total) by mouth daily.  90 tablet  1  . carbonyl iron (FEOSOL) 45 MG TABS Take 45 mg by mouth daily.      . furosemide (LASIX) 40 MG tablet Take 20-40 mg by mouth daily as needed. Take one half to one tablet by mouth once daily as needed for fluid retention      . HYDROcodone-acetaminophen (NORCO) 10-325 MG per tablet Take 2 tablets by mouth as needed.       Marland Kitchen imipramine (TOFRANIL) 50 MG tablet Take 2 tablets (100 mg total) by mouth at bedtime.  180 tablet  1  . latanoprost (XALATAN) 0.005 % ophthalmic solution Place 1 drop into both eyes at bedtime.       Marland Kitchen losartan-hydrochlorothiazide (HYZAAR) 50-12.5 MG per tablet Take 1 tablet by mouth daily.  90 tablet  1  . meclizine (ANTIVERT) 25 MG tablet Take 25 mg by mouth as needed.      . methocarbamol (ROBAXIN) 500 MG tablet Take 500 mg by mouth every 6  (six) hours as needed.      Marland Kitchen omeprazole (PRILOSEC) 20 MG capsule Take 1 capsule (20 mg total) by mouth 2 (two) times daily.  60 capsule  5  . polyethylene glycol powder (GLYCOLAX/MIRALAX) powder Take 17 g by mouth daily as needed. For constipation      . pravastatin (PRAVACHOL) 20 MG tablet Take 1 tablet (20 mg total) by mouth every evening.  90 tablet  1  . propranolol (INDERAL) 80 MG tablet Take 1 tablet (80 mg total) by mouth daily.  90 tablet  1  . topiramate (TOPAMAX) 100 MG tablet Take 1 tablet (100 mg total) by mouth 2 (two) times daily.  180 tablet  1  . warfarin (COUMADIN) 7.5 MG tablet Take 3.75-7.5 mg by mouth See admin instructions. Patient takes 1 tablet daily except for Mondays , Wednesdays and Fridays she takes 1/2 tablet         Objective: Blood pressure 140/86, pulse 78, temperature 97.3 F (36.3 C), temperature source Oral, resp. rate 18, height 5\' 7"  (1.702 m), weight 206 lb 8 oz (93.668 kg). Patient is alert and in no acute distress. Conjunctiva is pink. Sclera is nonicteric Oropharyngeal mucosa is normal. No neck masses or thyromegaly noted. Abdomen is full but soft and nontender without organomegaly or masses. No LE edema or clubbing noted.  Labs/studies Results: Lab data  from 11/23/2011 H&H 10.8 and 32.6. MCV 84.5. Serum iron was 158 and ferritin 92.    Assessment:  History of iron deficiency anemia secondary to GI blood loss. She had small bleeding duodenal lesion coagulated with APC back in July 2013. 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 It appears this therapy prove to be effective as he has not experienced any more overt GI bleed. Her stool is guaiac-negative today. Her H&H and ferritin are up. She had uneventful course following left knee replacement on 10/20/2011.   Plan: Patient advised not to take NSAIDs. Patient will stay on one iron pill daily. She'll have CBC in 3 months. She will call the office should she experience rectal bleeding or  melena. Office visit in 6 months.

## 2012-01-11 ENCOUNTER — Ambulatory Visit (INDEPENDENT_AMBULATORY_CARE_PROVIDER_SITE_OTHER): Payer: Medicare Other | Admitting: Internal Medicine

## 2012-01-19 ENCOUNTER — Ambulatory Visit (INDEPENDENT_AMBULATORY_CARE_PROVIDER_SITE_OTHER): Payer: Medicare Other | Admitting: *Deleted

## 2012-01-19 DIAGNOSIS — Z7901 Long term (current) use of anticoagulants: Secondary | ICD-10-CM

## 2012-01-19 DIAGNOSIS — I2699 Other pulmonary embolism without acute cor pulmonale: Secondary | ICD-10-CM

## 2012-01-19 LAB — POCT INR: INR: 3.2

## 2012-01-26 ENCOUNTER — Other Ambulatory Visit: Payer: Self-pay

## 2012-01-26 DIAGNOSIS — K219 Gastro-esophageal reflux disease without esophagitis: Secondary | ICD-10-CM

## 2012-01-26 DIAGNOSIS — E785 Hyperlipidemia, unspecified: Secondary | ICD-10-CM

## 2012-01-26 MED ORDER — TEMAZEPAM 30 MG PO CAPS: 30.0000 mg | ORAL_CAPSULE | Freq: Every evening | ORAL | Status: DC | PRN

## 2012-01-26 MED ORDER — OMEPRAZOLE 20 MG PO CPDR: 20.0000 mg | DELAYED_RELEASE_CAPSULE | Freq: Two times a day (BID) | ORAL | Status: DC

## 2012-01-26 MED ORDER — PRAVASTATIN SODIUM 20 MG PO TABS: 20.0000 mg | ORAL_TABLET | Freq: Every evening | ORAL | Status: DC

## 2012-01-26 MED ORDER — IMIPRAMINE HCL 50 MG PO TABS: 100.0000 mg | ORAL_TABLET | Freq: Every day | ORAL | Status: DC

## 2012-02-15 ENCOUNTER — Other Ambulatory Visit: Payer: Self-pay | Admitting: Family Medicine

## 2012-02-21 ENCOUNTER — Ambulatory Visit (INDEPENDENT_AMBULATORY_CARE_PROVIDER_SITE_OTHER): Payer: Medicare Other | Admitting: *Deleted

## 2012-02-21 DIAGNOSIS — Z7901 Long term (current) use of anticoagulants: Secondary | ICD-10-CM

## 2012-02-21 DIAGNOSIS — I2699 Other pulmonary embolism without acute cor pulmonale: Secondary | ICD-10-CM

## 2012-02-21 LAB — POCT INR: INR: 4.7

## 2012-03-04 ENCOUNTER — Telehealth: Payer: Self-pay | Admitting: Family Medicine

## 2012-03-04 MED ORDER — TOPIRAMATE 100 MG PO TABS: 100.0000 mg | ORAL_TABLET | Freq: Two times a day (BID) | ORAL | Status: DC

## 2012-03-04 MED ORDER — IMIPRAMINE HCL 50 MG PO TABS: 100.0000 mg | ORAL_TABLET | Freq: Every day | ORAL | Status: DC

## 2012-03-04 NOTE — Telephone Encounter (Signed)
Pt called upset has been out of her topamax and imipramine past couple of days, on order from [employer], [facility] could not [facility], asking for meds to be called to [facility]. Having migraine and did not want to go to ER. Advised rest, called 2 week supply in to [facility] for her to pick up today.

## 2012-03-06 ENCOUNTER — Ambulatory Visit (INDEPENDENT_AMBULATORY_CARE_PROVIDER_SITE_OTHER): Payer: Medicare Other | Admitting: *Deleted

## 2012-03-06 ENCOUNTER — Other Ambulatory Visit: Payer: Self-pay | Admitting: *Deleted

## 2012-03-06 DIAGNOSIS — I2699 Other pulmonary embolism without acute cor pulmonale: Secondary | ICD-10-CM

## 2012-03-06 DIAGNOSIS — Z7901 Long term (current) use of anticoagulants: Secondary | ICD-10-CM

## 2012-03-06 MED ORDER — WARFARIN SODIUM 7.5 MG PO TABS: ORAL_TABLET | ORAL | Status: DC

## 2012-03-21 ENCOUNTER — Encounter (HOSPITAL_COMMUNITY): Payer: Self-pay | Admitting: Pharmacy Technician

## 2012-03-22 NOTE — Patient Instructions (Addendum)
Your procedure is scheduled on: 03/28/2012 Report to [facility] at   700   AM.  Call this number if you have problems the morning of surgery: [phone]   Do not eat food or drink liquids :After Midnight.      Take these medicines the morning of surgery with A SIP OF WATER:norvasc,tofranil,hyzaar,antivert,robaxcin,prilosec,inderal,topamax   Do not wear jewelry, make-up or nail polish.  Do not wear lotions, powders, or perfumes.   Do not shave 48 hours prior to surgery.  Do not bring valuables to the hospital.  Contacts, dentures or bridgework may not be worn into surgery.  Leave suitcase in the car. After surgery it may be brought to your room.  For patients admitted to the hospital, checkout time is 11:00 AM the day of discharge.   Patients discharged the day of surgery will not be allowed to drive home.  :     Please read over the following fact sheets that you were given: Coughing and Deep Breathing, Surgical Site Infection Prevention, Anesthesia Post-op Instructions and Care and Recovery After Surgery    Cataract A cataract is a clouding of the lens of the eye. When a lens becomes cloudy, vision is reduced based on the degree and nature of the clouding. Many cataracts reduce vision to some degree. Some cataracts make people more near-sighted as they develop. Other cataracts increase glare. Cataracts that are ignored and become worse can sometimes look white. The white color can be seen through the pupil. CAUSES   Aging. However, cataracts may occur at any age, even in newborns.   Certain drugs.   Trauma to the eye.   Certain diseases such as diabetes.   Specific eye diseases such as chronic inflammation inside the eye or a sudden attack of a rare form of glaucoma.   Inherited or acquired medical problems.  SYMPTOMS   Gradual, progressive drop in vision in the affected eye.   Severe, rapid visual loss. This most often happens when trauma is the cause.  DIAGNOSIS  To detect  a cataract, an eye doctor examines the lens. Cataracts are best diagnosed with an exam of the eyes with the pupils enlarged (dilated) by drops.  TREATMENT  For an early cataract, vision may improve by using different eyeglasses or stronger lighting. If that does not help your vision, surgery is the only effective treatment. A cataract needs to be surgically removed when vision loss interferes with your everyday activities, such as driving, reading, or watching TV. A cataract may also have to be removed if it prevents examination or treatment of another eye problem. Surgery removes the cloudy lens and usually replaces it with a substitute lens (intraocular lens, IOL).  At a time when both you and your doctor agree, the cataract will be surgically removed. If you have cataracts in both eyes, only one is usually removed at a time. This allows the operated eye to heal and be out of danger from any possible problems after surgery (such as infection or poor wound healing). In rare cases, a cataract may be doing damage to your eye. In these cases, your caregiver may advise surgical removal right away. The vast majority of people who have cataract surgery have better vision afterward. HOME CARE INSTRUCTIONS  If you are not planning surgery, you may be asked to do the following:  Use different eyeglasses.   Use stronger or brighter lighting.   Ask your eye doctor about reducing your medicine dose or changing medicines if  it is thought that a medicine caused your cataract. Changing medicines does not make the cataract go away on its own.   Become familiar with your surroundings. Poor vision can lead to injury. Avoid bumping into things on the affected side. You are at a higher risk for tripping or falling.   Exercise extreme care when driving or operating machinery.   Wear sunglasses if you are sensitive to bright light or experiencing problems with glare.  SEEK IMMEDIATE MEDICAL CARE IF:   You have a  worsening or sudden vision loss.   You notice redness, swelling, or increasing pain in the eye.   You have a fever.  Document Released: 02/01/2005 Document Revised: 01/21/2011 Document Reviewed: 09/25/2010 [facility] Patient Information 2012 [place], [place].PATIENT INSTRUCTIONS POST-ANESTHESIA  IMMEDIATELY FOLLOWING SURGERY:  Do not drive or operate machinery for the first twenty four hours after surgery.  Do not make any important decisions for twenty four hours after surgery or while taking narcotic pain medications or sedatives.  If you develop intractable nausea and vomiting or a severe headache please notify your doctor immediately.  FOLLOW-UP:  Please make an appointment with your surgeon as instructed. You do not need to follow up with anesthesia unless specifically instructed to do so.  WOUND CARE INSTRUCTIONS (if applicable):  Keep a dry clean dressing on the anesthesia/puncture wound site if there is drainage.  Once the wound has quit draining you may leave it open to air.  Generally you should leave the bandage intact for twenty four hours unless there is drainage.  If the epidural site drains for more than 36-48 hours please call the anesthesia department.  QUESTIONS?:  Please feel free to call your physician or the hospital operator if you have any questions, and they will be happy to assist you.

## 2012-03-23 ENCOUNTER — Encounter (HOSPITAL_COMMUNITY)
Admission: RE | Admit: 2012-03-23 | Discharge: 2012-03-23 | Disposition: A | Discharge status: Home / Self Care | Payer: Medicare Other | Source: Ambulatory Visit | Attending: Ophthalmology | Admitting: Ophthalmology

## 2012-03-23 ENCOUNTER — Encounter (HOSPITAL_COMMUNITY): Payer: Self-pay

## 2012-03-23 LAB — BASIC METABOLIC PANEL
BUN: 24 mg/dL — ABNORMAL HIGH (ref 6–23)
Chloride: 104 mEq/L (ref 96–112)
Creatinine, Ser: 1.08 mg/dL (ref 0.50–1.10)
GFR calc Af Amer: 58 mL/min — ABNORMAL LOW (ref >90)
GFR calc non Af Amer: 50 mL/min — ABNORMAL LOW (ref >90)

## 2012-03-23 LAB — HEMOGLOBIN AND HEMATOCRIT, BLOOD: Hemoglobin: 9.9 g/dL — ABNORMAL LOW (ref 12.0–15.0)

## 2012-03-27 ENCOUNTER — Telehealth: Payer: Self-pay | Admitting: Family Medicine

## 2012-03-27 NOTE — Telephone Encounter (Signed)
Use robitussin dm sugar free as directed on bottle. Ensure and document fever or chills pls

## 2012-03-27 NOTE — Telephone Encounter (Signed)
Patient states that she just has cough.  Is unable to cough anything up.  No fever.  Please advise.

## 2012-03-27 NOTE — Telephone Encounter (Signed)
Error

## 2012-03-28 ENCOUNTER — Ambulatory Visit (INDEPENDENT_AMBULATORY_CARE_PROVIDER_SITE_OTHER): Payer: Medicare Other | Admitting: Family Medicine

## 2012-03-28 ENCOUNTER — Encounter: Payer: Self-pay | Admitting: Family Medicine

## 2012-03-28 VITALS — BP 144/84 | HR 84 | Resp 18 | Ht 65.0 in | Wt 202.1 lb

## 2012-03-28 DIAGNOSIS — E663 Overweight: Secondary | ICD-10-CM

## 2012-03-28 DIAGNOSIS — E785 Hyperlipidemia, unspecified: Secondary | ICD-10-CM

## 2012-03-28 DIAGNOSIS — J209 Acute bronchitis, unspecified: Secondary | ICD-10-CM

## 2012-03-28 DIAGNOSIS — I10 Essential (primary) hypertension: Secondary | ICD-10-CM

## 2012-03-28 DIAGNOSIS — J019 Acute sinusitis, unspecified: Secondary | ICD-10-CM

## 2012-03-28 MED ORDER — PROMETHAZINE-DM 6.25-15 MG/5ML PO SYRP: ORAL_SOLUTION | ORAL | Status: DC

## 2012-03-28 MED ORDER — BENZONATATE 100 MG PO CAPS: 100.0000 mg | ORAL_CAPSULE | Freq: Four times a day (QID) | ORAL | Status: DC | PRN

## 2012-03-28 MED ORDER — PENICILLIN V POTASSIUM 500 MG PO TABS: 500.0000 mg | ORAL_TABLET | Freq: Three times a day (TID) | ORAL | Status: DC

## 2012-03-28 NOTE — Progress Notes (Signed)
  Subjective:    Patient ID: [name], female    DOB: [birth year], 72 y.o.   MRN: [id number]  HPI 1 week h/o excessive head and chest congestion, sputum and nasal drainage are thick, and yellow has had chills, no documented fever. Coughing excessively, espescialy at night, with urinary incontinence   Review of Systems See HPI Denies chest pains, palpitations and leg swelling Denies abdominal pain, nausea, vomiting,diarrhea or constipation.   Denies dysuria, frequency, hesitancy Denies joint pain, swelling and limitation in mobility. Denies headaches, seizures, numbness, or tingling. Denies depression, anxiety or insomnia. Denies skin break down or rash.        Objective:   Physical Exam  Patient alert and oriented and in mild  cardiopulmonary distress.Ill appearing  HEENT: No facial asymmetry, EOMI, frontal ad maxillary  sinus tenderness,  oropharynx pink and moist.  Neck supple bilateral cervical adenopathy.  Chest: decreased  Though adequate air entry bilaterally, scattered crackles and wheezes  CVS: S1, S2 no murmurs, no S3.  ABD: Soft non tender. Bowel sounds normal.  Ext: No edema  MS: Adequate ROM spine, shoulders, hips and knees.  Skin: Intact, no ulcerations or rash noted.  Psych: Good eye contact, normal affect. Memory intact not anxious or depressed appearing.  CNS: CN 2-12 intact, power, tone and sensation normal throughout.       Assessment & Plan:

## 2012-03-28 NOTE — Telephone Encounter (Signed)
Patient coming in for appointment 2/11

## 2012-03-28 NOTE — Patient Instructions (Addendum)
Annual wellness in 3.5 month, call if you need me before.  Antibiotic, decongestant and cough suppressant are sent to your pharmacy

## 2012-03-30 DIAGNOSIS — J0100 Acute maxillary sinusitis, unspecified: Secondary | ICD-10-CM | POA: Insufficient documentation

## 2012-03-30 NOTE — Assessment & Plan Note (Signed)
Sub optimal though adequate control, no med change 

## 2012-03-30 NOTE — Assessment & Plan Note (Signed)
Antibiotic and decongestant and cough suppressant prescribed

## 2012-03-30 NOTE — Assessment & Plan Note (Signed)
Improved. Pt applauded on succesful weight loss through lifestyle change, and encouraged to continue same. Weight loss goal set for the next several months.  

## 2012-03-30 NOTE — Assessment & Plan Note (Signed)
Antibiotic course prescribed 

## 2012-03-30 NOTE — Assessment & Plan Note (Signed)
Controlled when last checked Hyperlipidemia:Low fat diet discussed and encouraged.  Updated lab next visit

## 2012-04-03 ENCOUNTER — Ambulatory Visit (INDEPENDENT_AMBULATORY_CARE_PROVIDER_SITE_OTHER): Payer: Medicare Other | Admitting: *Deleted

## 2012-04-03 DIAGNOSIS — I2699 Other pulmonary embolism without acute cor pulmonale: Secondary | ICD-10-CM

## 2012-04-03 DIAGNOSIS — Z7901 Long term (current) use of anticoagulants: Secondary | ICD-10-CM

## 2012-04-05 ENCOUNTER — Encounter (HOSPITAL_COMMUNITY)
Admission: RE | Admit: 2012-04-05 | Discharge: 2012-04-05 | Disposition: A | Discharge status: Home / Self Care | Payer: Medicare Other | Source: Ambulatory Visit | Attending: Ophthalmology | Admitting: Ophthalmology

## 2012-04-10 MED ORDER — PHENYLEPHRINE HCL 2.5 % OP SOLN
OPHTHALMIC | Status: AC
  Filled 2012-04-10: qty 2

## 2012-04-10 MED ORDER — FLURBIPROFEN SODIUM 0.03 % OP SOLN
OPHTHALMIC | Status: AC
  Filled 2012-04-10: qty 2.5

## 2012-04-10 MED ORDER — TETRACAINE HCL 0.5 % OP SOLN
OPHTHALMIC | Status: AC
  Filled 2012-04-10: qty 2

## 2012-04-10 MED ORDER — CYCLOPENTOLATE-PHENYLEPHRINE 0.2-1 % OP SOLN
OPHTHALMIC | Status: AC
  Filled 2012-04-10: qty 2

## 2012-04-11 ENCOUNTER — Ambulatory Visit (HOSPITAL_COMMUNITY): Payer: Medicare Other | Admitting: Anesthesiology

## 2012-04-11 ENCOUNTER — Encounter (HOSPITAL_COMMUNITY)
Admission: RE | Disposition: A | Discharge status: Home / Self Care | Payer: Self-pay | Source: Ambulatory Visit | Attending: Ophthalmology

## 2012-04-11 ENCOUNTER — Encounter (HOSPITAL_COMMUNITY): Payer: Self-pay | Admitting: *Deleted

## 2012-04-11 ENCOUNTER — Ambulatory Visit (HOSPITAL_COMMUNITY)
Admission: RE | Admit: 2012-04-11 | Discharge: 2012-04-11 | Disposition: A | Discharge status: Home / Self Care | Payer: Medicare Other | Source: Ambulatory Visit | Attending: Ophthalmology | Admitting: Ophthalmology

## 2012-04-11 ENCOUNTER — Encounter (HOSPITAL_COMMUNITY): Payer: Self-pay | Admitting: Anesthesiology

## 2012-04-11 DIAGNOSIS — H2510 Age-related nuclear cataract, unspecified eye: Secondary | ICD-10-CM | POA: Insufficient documentation

## 2012-04-11 DIAGNOSIS — E119 Type 2 diabetes mellitus without complications: Secondary | ICD-10-CM | POA: Insufficient documentation

## 2012-04-11 DIAGNOSIS — I10 Essential (primary) hypertension: Secondary | ICD-10-CM | POA: Insufficient documentation

## 2012-04-11 DIAGNOSIS — Z01812 Encounter for preprocedural laboratory examination: Secondary | ICD-10-CM | POA: Insufficient documentation

## 2012-04-11 HISTORY — PX: CATARACT EXTRACTION W/PHACO: SHX586

## 2012-04-11 SURGERY — PHACOEMULSIFICATION, CATARACT, WITH IOL INSERTION
Anesthesia: Monitor Anesthesia Care | Laterality: Right | Wound class: Clean

## 2012-04-11 MED ORDER — FLURBIPROFEN SODIUM 0.03 % OP SOLN
1.0000 [drp] | OPHTHALMIC | Status: AC
  Administered 2012-04-11 (×3): 1 [drp] via OPHTHALMIC

## 2012-04-11 MED ORDER — TETRACAINE HCL 0.5 % OP SOLN
1.0000 [drp] | OPHTHALMIC | Status: AC
  Administered 2012-04-11 (×3): 1 [drp] via OPHTHALMIC

## 2012-04-11 MED ORDER — MIDAZOLAM HCL 2 MG/2ML IJ SOLN
INTRAMUSCULAR | Status: AC
  Filled 2012-04-11: qty 2

## 2012-04-11 MED ORDER — CYCLOPENTOLATE-PHENYLEPHRINE 0.2-1 % OP SOLN
1.0000 [drp] | OPHTHALMIC | Status: AC
  Administered 2012-04-11 (×3): 1 [drp] via OPHTHALMIC

## 2012-04-11 MED ORDER — EPINEPHRINE HCL 1 MG/ML IJ SOLN
INTRAMUSCULAR | Status: AC
  Filled 2012-04-11: qty 1

## 2012-04-11 MED ORDER — PROVISC 10 MG/ML IO SOLN
INTRAOCULAR | Status: DC | PRN
  Administered 2012-04-11: 8.5 mg via INTRAOCULAR

## 2012-04-11 MED ORDER — MIDAZOLAM HCL 2 MG/2ML IJ SOLN
1.0000 mg | INTRAMUSCULAR | Status: DC | PRN
Start: 2012-04-11 — End: 2012-04-11
  Administered 2012-04-11: 2 mg via INTRAVENOUS

## 2012-04-11 MED ORDER — PHENYLEPHRINE HCL 2.5 % OP SOLN
1.0000 [drp] | OPHTHALMIC | Status: AC
  Administered 2012-04-11 (×3): 1 [drp] via OPHTHALMIC

## 2012-04-11 MED ORDER — BSS IO SOLN
INTRAOCULAR | Status: DC | PRN
  Administered 2012-04-11: 15 mL via INTRAOCULAR

## 2012-04-11 MED ORDER — TETRACAINE 0.5 % OP SOLN OPTIME - NO CHARGE
OPHTHALMIC | Status: DC | PRN
  Administered 2012-04-11: 1 [drp] via OPHTHALMIC

## 2012-04-11 MED ORDER — EPINEPHRINE HCL 1 MG/ML IJ SOLN
INTRAOCULAR | Status: DC | PRN
  Administered 2012-04-11: 07:00:00

## 2012-04-11 MED ORDER — LACTATED RINGERS IV SOLN
INTRAVENOUS | Status: DC
  Administered 2012-04-11: 07:00:00 via INTRAVENOUS

## 2012-04-11 SURGICAL SUPPLY — 9 items
CLOTH BEACON ORANGE TIMEOUT ST (SAFETY) ×2 IMPLANT
EYE SHIELD UNIVERSAL CLEAR (GAUZE/BANDAGES/DRESSINGS) ×2 IMPLANT
GLOVE BIO SURGEON STRL SZ 6.5 (GLOVE) ×2 IMPLANT
GLOVE EXAM NITRILE LRG STRL (GLOVE) ×2 IMPLANT
PAD ARMBOARD 7.5X6 YLW CONV (MISCELLANEOUS) ×2 IMPLANT
SIGHTPATH CAT PROC W REG LENS (Ophthalmic Related) ×2 IMPLANT
TAPE SURG TRANSPORE 1 IN (GAUZE/BANDAGES/DRESSINGS) ×1 IMPLANT
TAPE SURGICAL TRANSPORE 1 IN (GAUZE/BANDAGES/DRESSINGS) ×1
WATER STERILE IRR 250ML POUR (IV SOLUTION) ×2 IMPLANT

## 2012-04-11 NOTE — Transfer of Care (Signed)
Immediate Anesthesia Transfer of Care Note  Patient: [name]  Procedure(s) Performed: Procedure(s) with comments: CATARACT EXTRACTION PHACO AND INTRAOCULAR LENS PLACEMENT (IOC) (Right) - CDE=9.61  Patient Location: Short Stay  Anesthesia Type:MAC  Level of Consciousness: awake, alert , oriented and patient cooperative  Airway & Oxygen Therapy: Patient Spontanous Breathing  Post-op Assessment: Report given to PACU RN, Post -op Vital signs reviewed and stable and Patient moving all extremities  Post vital signs: Reviewed and stable  Complications: No apparent anesthesia complications

## 2012-04-11 NOTE — Anesthesia Postprocedure Evaluation (Signed)
  Anesthesia Post-op Note  Patient: [name]  Procedure(s) Performed: Procedure(s) with comments: CATARACT EXTRACTION PHACO AND INTRAOCULAR LENS PLACEMENT (IOC) (Right) - CDE=9.61  Patient Location: Short Stay  Anesthesia Type:MAC  Level of Consciousness: awake, alert , oriented and patient cooperative  Airway and Oxygen Therapy: Patient Spontanous Breathing  Post-op Pain: none  Post-op Assessment: Post-op Vital signs reviewed, Patient's Cardiovascular Status Stable, Respiratory Function Stable, Patent Airway, No signs of Nausea or vomiting and Pain level controlled  Post-op Vital Signs: Reviewed and stable  Complications: No apparent anesthesia complications

## 2012-04-11 NOTE — Anesthesia Preprocedure Evaluation (Signed)
Anesthesia Evaluation  Patient identified by MRN, date of birth, ID band Patient awake    Reviewed: Allergy & Precautions, H&P , NPO status , Patient's Chart, lab work & pertinent test results  Airway Mallampati: II      Dental   Pulmonary pneumonia -, resolved,  breath sounds clear to auscultation        Cardiovascular hypertension, Pt. on medications Rhythm:Regular Rate:Normal     Neuro/Psych  Headaches, Anxiety Depression    GI/Hepatic Neg liver ROS, GERD-  Controlled,  Endo/Other  diabetes, Type 2  Renal/GU negative Renal ROS     Musculoskeletal   Abdominal   Peds  Hematology negative hematology ROS (+)   Anesthesia Other Findings   Reproductive/Obstetrics                           Anesthesia Physical Anesthesia Plan  ASA: III  Anesthesia Plan: MAC   Post-op Pain Management:    Induction: Intravenous  Airway Management Planned: Nasal Cannula  Additional Equipment:   Intra-op Plan:   Post-operative Plan:   Informed Consent: I have reviewed the patients History and Physical, chart, labs and discussed the procedure including the risks, benefits and alternatives for the proposed anesthesia with the patient or authorized representative who has indicated his/her understanding and acceptance.     Plan Discussed with:   Anesthesia Plan Comments:         Anesthesia Quick Evaluation

## 2012-04-11 NOTE — H&P (Signed)
The patient was re examined and there is no change in the patients condition since the original H and P. 

## 2012-04-11 NOTE — Op Note (Signed)
Patient brought to the operating room and prepped and draped in the usual manner.  Lid speculum inserted in right eye.  Stab incision made at the twelve o'clock position.  Provisc instilled in the anterior chamber.   A 2.4 mm. Stab incision was made temporally.  An anterior capsulotomy was done with a bent 25 gauge needle.  The nucleus was hydrodissected.  The Phaco tip was inserted in the anterior chamber and the nucleus was emulsified.  CDE was 9.61.  The cortical material was then removed with the I and A tip.  Posterior capsule was the polished.  The anterior chamber was deepened with Provisc.  A 21.0 Diopter Rayner 570C IOL was then inserted in the capsular bag.  Provisc was then removed with the I and A tip.  The wound was then hydrated.  Patient sent to the Recovery Room in good condition with follow up in my office.

## 2012-04-12 ENCOUNTER — Encounter (HOSPITAL_COMMUNITY): Payer: Self-pay | Admitting: Ophthalmology

## 2012-04-12 MED ORDER — FENTANYL CITRATE 0.05 MG/ML IJ SOLN: 25.0000 ug | INTRAMUSCULAR | Status: AC | PRN

## 2012-04-12 MED ORDER — ONDANSETRON HCL 4 MG/2ML IJ SOLN: 4.0000 mg | Freq: Once | INTRAMUSCULAR | Status: AC | PRN

## 2012-04-19 ENCOUNTER — Encounter: Payer: Medicare Other | Admitting: Family Medicine

## 2012-05-04 ENCOUNTER — Ambulatory Visit (INDEPENDENT_AMBULATORY_CARE_PROVIDER_SITE_OTHER): Payer: Medicare Other | Admitting: *Deleted

## 2012-05-04 DIAGNOSIS — Z7901 Long term (current) use of anticoagulants: Secondary | ICD-10-CM

## 2012-05-04 DIAGNOSIS — I2699 Other pulmonary embolism without acute cor pulmonale: Secondary | ICD-10-CM

## 2012-05-05 ENCOUNTER — Telehealth: Payer: Self-pay | Admitting: Family Medicine

## 2012-05-05 ENCOUNTER — Other Ambulatory Visit: Payer: Self-pay | Admitting: Family Medicine

## 2012-05-05 DIAGNOSIS — Z139 Encounter for screening, unspecified: Secondary | ICD-10-CM

## 2012-05-08 ENCOUNTER — Other Ambulatory Visit: Payer: Self-pay

## 2012-05-08 ENCOUNTER — Other Ambulatory Visit: Payer: Self-pay | Admitting: Family Medicine

## 2012-05-08 ENCOUNTER — Ambulatory Visit (HOSPITAL_COMMUNITY)
Admission: RE | Admit: 2012-05-08 | Discharge: 2012-05-08 | Disposition: A | Discharge status: Home / Self Care | Payer: Medicare Other | Source: Ambulatory Visit | Attending: Family Medicine | Admitting: Family Medicine

## 2012-05-08 DIAGNOSIS — F419 Anxiety disorder, unspecified: Secondary | ICD-10-CM

## 2012-05-08 DIAGNOSIS — Z139 Encounter for screening, unspecified: Secondary | ICD-10-CM

## 2012-05-08 MED ORDER — ALPRAZOLAM 0.25 MG PO TABS: ORAL_TABLET | ORAL | Status: DC

## 2012-05-08 NOTE — Telephone Encounter (Signed)
Received verbal order to call in rx for patient.

## 2012-05-10 ENCOUNTER — Other Ambulatory Visit (HOSPITAL_COMMUNITY): Payer: Self-pay | Admitting: Family Medicine

## 2012-05-10 ENCOUNTER — Ambulatory Visit (HOSPITAL_COMMUNITY)
Admission: RE | Admit: 2012-05-10 | Discharge: 2012-05-10 | Disposition: A | Discharge status: Home / Self Care | Payer: Medicare Other | Source: Ambulatory Visit | Attending: Family Medicine | Admitting: Family Medicine

## 2012-05-10 DIAGNOSIS — N63 Unspecified lump in unspecified breast: Secondary | ICD-10-CM | POA: Insufficient documentation

## 2012-05-10 DIAGNOSIS — IMO0002 Reserved for concepts with insufficient information to code with codable children: Secondary | ICD-10-CM

## 2012-05-12 ENCOUNTER — Encounter (HOSPITAL_COMMUNITY): Payer: Self-pay | Admitting: Pharmacy Technician

## 2012-05-16 ENCOUNTER — Encounter (HOSPITAL_COMMUNITY)
Admission: RE | Admit: 2012-05-16 | Discharge: 2012-05-16 | Disposition: A | Discharge status: Home / Self Care | Payer: Medicare Other | Source: Ambulatory Visit | Attending: Ophthalmology | Admitting: Ophthalmology

## 2012-05-16 NOTE — Patient Instructions (Addendum)
Your procedure is scheduled on:  05/23/2012   Report to [facility] at  615  AM.  Call this number if you have problems the morning of surgery: [phone]   Do not eat food or drink liquids :After Midnight.      Take these medicines the morning of surgery with A SIP OF WATER:amlodipine,losartan-hctz,omeprazole,propranolol,topamax, xanax,meclizine,promethazine,methocarbanol   Do not wear jewelry, make-up or nail polish.  Do not wear lotions, powders, or perfumes.   Do not shave 48 hours prior to surgery.  Do not bring valuables to the hospital.  Contacts, dentures or bridgework may not be worn into surgery.  Leave suitcase in the car. After surgery it may be brought to your room.  For patients admitted to the hospital, checkout time is 11:00 AM the day of discharge.   Patients discharged the day of surgery will not be allowed to drive home.  :     Please read over the following fact sheets that you were given: Coughing and Deep Breathing, Surgical Site Infection Prevention, Anesthesia Post-op Instructions and Care and Recovery After Surgery    Cataract A cataract is a clouding of the lens of the eye. When a lens becomes cloudy, vision is reduced based on the degree and nature of the clouding. Many cataracts reduce vision to some degree. Some cataracts make people more near-sighted as they develop. Other cataracts increase glare. Cataracts that are ignored and become worse can sometimes look white. The white color can be seen through the pupil. CAUSES   Aging. However, cataracts may occur at any age, even in newborns.   Certain drugs.   Trauma to the eye.   Certain diseases such as diabetes.   Specific eye diseases such as chronic inflammation inside the eye or a sudden attack of a rare form of glaucoma.   Inherited or acquired medical problems.  SYMPTOMS   Gradual, progressive drop in vision in the affected eye.   Severe, rapid visual loss. This most often happens when trauma is  the cause.  DIAGNOSIS  To detect a cataract, an eye doctor examines the lens. Cataracts are best diagnosed with an exam of the eyes with the pupils enlarged (dilated) by drops.  TREATMENT  For an early cataract, vision may improve by using different eyeglasses or stronger lighting. If that does not help your vision, surgery is the only effective treatment. A cataract needs to be surgically removed when vision loss interferes with your everyday activities, such as driving, reading, or watching TV. A cataract may also have to be removed if it prevents examination or treatment of another eye problem. Surgery removes the cloudy lens and usually replaces it with a substitute lens (intraocular lens, IOL).  At a time when both you and your doctor agree, the cataract will be surgically removed. If you have cataracts in both eyes, only one is usually removed at a time. This allows the operated eye to heal and be out of danger from any possible problems after surgery (such as infection or poor wound healing). In rare cases, a cataract may be doing damage to your eye. In these cases, your caregiver may advise surgical removal right away. The vast majority of people who have cataract surgery have better vision afterward. HOME CARE INSTRUCTIONS  If you are not planning surgery, you may be asked to do the following:  Use different eyeglasses.   Use stronger or brighter lighting.   Ask your eye doctor about reducing your medicine dose or changing  medicines if it is thought that a medicine caused your cataract. Changing medicines does not make the cataract go away on its own.   Become familiar with your surroundings. Poor vision can lead to injury. Avoid bumping into things on the affected side. You are at a higher risk for tripping or falling.   Exercise extreme care when driving or operating machinery.   Wear sunglasses if you are sensitive to bright light or experiencing problems with glare.  SEEK IMMEDIATE  MEDICAL CARE IF:   You have a worsening or sudden vision loss.   You notice redness, swelling, or increasing pain in the eye.   You have a fever.  Document Released: 02/01/2005 Document Revised: 01/21/2011 Document Reviewed: 09/25/2010 [facility] Patient Information 2012 [place], [place].PATIENT INSTRUCTIONS POST-ANESTHESIA  IMMEDIATELY FOLLOWING SURGERY:  Do not drive or operate machinery for the first twenty four hours after surgery.  Do not make any important decisions for twenty four hours after surgery or while taking narcotic pain medications or sedatives.  If you develop intractable nausea and vomiting or a severe headache please notify your doctor immediately.  FOLLOW-UP:  Please make an appointment with your surgeon as instructed. You do not need to follow up with anesthesia unless specifically instructed to do so.  WOUND CARE INSTRUCTIONS (if applicable):  Keep a dry clean dressing on the anesthesia/puncture wound site if there is drainage.  Once the wound has quit draining you may leave it open to air.  Generally you should leave the bandage intact for twenty four hours unless there is drainage.  If the epidural site drains for more than 36-48 hours please call the anesthesia department.  QUESTIONS?:  Please feel free to call your physician or the hospital operator if you have any questions, and they will be happy to assist you.

## 2012-05-29 ENCOUNTER — Ambulatory Visit (INDEPENDENT_AMBULATORY_CARE_PROVIDER_SITE_OTHER): Payer: Medicare Other | Admitting: *Deleted

## 2012-05-29 DIAGNOSIS — I2699 Other pulmonary embolism without acute cor pulmonale: Secondary | ICD-10-CM

## 2012-05-29 DIAGNOSIS — Z7901 Long term (current) use of anticoagulants: Secondary | ICD-10-CM

## 2012-05-30 ENCOUNTER — Encounter (HOSPITAL_COMMUNITY)
Admission: RE | Admit: 2012-05-30 | Discharge: 2012-05-30 | Disposition: A | Discharge status: Home / Self Care | Payer: Medicare Other | Source: Ambulatory Visit | Attending: Ophthalmology | Admitting: Ophthalmology

## 2012-05-30 ENCOUNTER — Encounter (HOSPITAL_COMMUNITY): Payer: Self-pay

## 2012-06-05 ENCOUNTER — Encounter: Payer: Self-pay | Admitting: *Deleted

## 2012-06-05 ENCOUNTER — Telehealth: Payer: Self-pay | Admitting: Cardiology

## 2012-06-05 NOTE — Telephone Encounter (Signed)
Patient wants to know if she can go to lab for next INR.  States that she does not have money for copay. / tgs

## 2012-06-05 NOTE — Telephone Encounter (Signed)
Explained to pt that we do not send pts to lab for INR's.  She request to reschedule INR appt to 5/7 at Dr Dietrich Pates appt. (1 co-pay) States she can not afford so many co-pays close together.  Appt moved and risks of waiting explained to pt.  She verbalized understanding.

## 2012-06-06 ENCOUNTER — Encounter (HOSPITAL_COMMUNITY): Admission: RE | Payer: Self-pay | Source: Ambulatory Visit

## 2012-06-06 ENCOUNTER — Ambulatory Visit (HOSPITAL_COMMUNITY): Admission: RE | Admit: 2012-06-06 | Payer: Medicare Other | Source: Ambulatory Visit | Admitting: Ophthalmology

## 2012-06-06 SURGERY — PHACOEMULSIFICATION, CATARACT, WITH IOL INSERTION
Anesthesia: Monitor Anesthesia Care | Laterality: Left

## 2012-06-21 ENCOUNTER — Ambulatory Visit (INDEPENDENT_AMBULATORY_CARE_PROVIDER_SITE_OTHER): Payer: Medicare Other | Admitting: *Deleted

## 2012-06-21 ENCOUNTER — Encounter: Payer: Self-pay | Admitting: Cardiology

## 2012-06-21 ENCOUNTER — Ambulatory Visit (INDEPENDENT_AMBULATORY_CARE_PROVIDER_SITE_OTHER): Payer: Medicare Other | Admitting: Cardiology

## 2012-06-21 ENCOUNTER — Ambulatory Visit: Payer: Medicare Other | Admitting: Cardiology

## 2012-06-21 VITALS — BP 122/74 | HR 63 | Ht 65.0 in | Wt 206.1 lb

## 2012-06-21 DIAGNOSIS — Z862 Personal history of diseases of the blood and blood-forming organs and certain disorders involving the immune mechanism: Secondary | ICD-10-CM

## 2012-06-21 DIAGNOSIS — R310 Gross hematuria: Secondary | ICD-10-CM

## 2012-06-21 DIAGNOSIS — I2699 Other pulmonary embolism without acute cor pulmonale: Secondary | ICD-10-CM

## 2012-06-21 DIAGNOSIS — Z7901 Long term (current) use of anticoagulants: Secondary | ICD-10-CM

## 2012-06-21 DIAGNOSIS — M1710 Unilateral primary osteoarthritis, unspecified knee: Secondary | ICD-10-CM

## 2012-06-21 DIAGNOSIS — D509 Iron deficiency anemia, unspecified: Secondary | ICD-10-CM

## 2012-06-21 DIAGNOSIS — I10 Essential (primary) hypertension: Secondary | ICD-10-CM

## 2012-06-21 DIAGNOSIS — Z8639 Personal history of other endocrine, nutritional and metabolic disease: Secondary | ICD-10-CM

## 2012-06-21 DIAGNOSIS — G43909 Migraine, unspecified, not intractable, without status migrainosus: Secondary | ICD-10-CM

## 2012-06-21 DIAGNOSIS — E785 Hyperlipidemia, unspecified: Secondary | ICD-10-CM

## 2012-06-21 LAB — POCT INR: INR: 6.4

## 2012-06-21 NOTE — Assessment & Plan Note (Signed)
Weight loss advised, which will likely completely normalize blood glucose.

## 2012-06-21 NOTE — Assessment & Plan Note (Addendum)
Patient has never had stable INRs, but fortunately has had no clinical evidence for recurrent thromboembolism. Rivaroxaban will likely be a less expensive, more effective and less difficult medication for her.  Since INR is so high, value will be repeated on Monday. If normal, as expected, treatment with rivaroxaban will be instituted at usual dose.

## 2012-06-21 NOTE — Assessment & Plan Note (Addendum)
Blood pressure control has been excellent for at least the past 6 months. Current therapy will be continued.

## 2012-06-21 NOTE — Progress Notes (Deleted)
Name: [name]    DOB: September 27, 1940  Age: 72 y.o.  MR#: [id number]       PCP:  Syliva Overman, MD      Insurance: Payor: [employer] [place] [employer]  Plan: BLUE MEDICARE  Product Type: *No Product type*    CC:   No chief complaint on file. bottles   VS Filed Vitals:   06/21/12 1326  BP: 122/74  Pulse: 63  Height: 5\' 5"  (1.651 m)  Weight: 206 lb 1.9 oz (93.495 kg)    Weights Current Weight  06/21/12 206 lb 1.9 oz (93.495 kg)  03/28/12 202 lb 1.9 oz (91.681 kg)  03/23/12 205 lb (92.987 kg)    Blood Pressure  BP Readings from Last 3 Encounters:  06/21/12 122/74  04/11/12 116/70  04/11/12 116/70     Admit date:  (Not on file) Last encounter with RMR:  06/21/2012   Allergy Fentanyl; Sulfonamide derivatives; and Codeine  Current Outpatient Prescriptions  Medication Sig Dispense Refill  . acetaminophen (ACETAMIN) 500 MG tablet Take 500 mg by mouth every 6 (six) hours as needed. Leg pain (OTC)      . ALPRAZolam (XANAX) 0.25 MG tablet One half to one tablet once daily, as needed, for anxiety.  30 tablet  0  . amLODipine (NORVASC) 10 MG tablet Take 1 tablet (10 mg total) by mouth daily.  90 tablet  1  . furosemide (LASIX) 40 MG tablet Take 20-40 mg by mouth daily as needed. Take one half to one tablet by mouth once daily as needed for fluid retention      . imipramine (TOFRANIL) 50 MG tablet Take 2 tablets (100 mg total) by mouth at bedtime.  30 tablet  0  . losartan-hydrochlorothiazide (HYZAAR) 50-12.5 MG per tablet Take 1 tablet by mouth daily.  90 tablet  1  . omeprazole (PRILOSEC) 20 MG capsule Take 1 capsule (20 mg total) by mouth 2 (two) times daily.  180 capsule  1  . penicillin v potassium (VEETID) 500 MG tablet Take 1 tablet (500 mg total) by mouth 3 (three) times daily.  30 tablet  0  . pravastatin (PRAVACHOL) 20 MG tablet Take 1 tablet (20 mg total) by mouth every evening.  90 tablet  1  . propranolol (INDERAL) 80 MG tablet Take 1 tablet (80 mg total) by  mouth daily.  90 tablet  1  . topiramate (TOPAMAX) 100 MG tablet Take 1 tablet (100 mg total) by mouth 2 (two) times daily.  30 tablet  0  . warfarin (COUMADIN) 7.5 MG tablet Take 3.75-7.5 mg by mouth daily. Takes 7.5 mg (1 tablet) on Sundays, Tuesdays and Thursdays Takes 3.75 MG   (1/2 tablet) on Mondays, Wednesdays, Fridays and Saturdays Always takes at 1700 hours       No current facility-administered medications for this visit.   Facility-Administered Medications Ordered in Other Visits  Medication Dose Route Frequency Provider Last Rate Last Dose  . fentaNYL (SUBLIMAZE) injection 25-50 mcg  25-50 mcg Intravenous Q5 min PRN Laurene Footman, MD        Discontinued Meds:    Medications Discontinued During This Encounter  Medication Reason  . benzonatate (TESSALON PERLES) 100 MG capsule Error  . carbonyl iron (FEOSOL) 45 MG TABS Error  . latanoprost (XALATAN) 0.005 % ophthalmic solution Error  . meclizine (ANTIVERT) 25 MG tablet Error  . methocarbamol (ROBAXIN) 500 MG tablet Error  . polyethylene glycol powder (GLYCOLAX/MIRALAX) powder Error  . promethazine-dextromethorphan (PROMETHAZINE-DM)  6.25-15 MG/5ML syrup Error  . temazepam (RESTORIL) 30 MG capsule Error    Patient Active Problem List   Diagnosis Date Noted  . Acute maxillary sinusitis 03/30/2012  . Acute bronchitis 03/28/2012  . Anxiety 11/23/2011  . Insomnia 08/06/2011  . Anemia, iron deficiency   . Chest pain 12/08/2010  . Iron deficiency anemia 08/03/2010  . Pulmonary embolism 07/21/2010  . Chronic anticoagulation 07/21/2010  . OSTEOARTHRITIS, KNEE, LEFT 06/09/2009  . INTERMITTENT VERTIGO 06/09/2009  . OVERWEIGHT/OBESITY 08/16/2008  . H/O diabetes mellitus 02/20/2008  . HYPERLIPIDEMIA 05/30/2007  . MIGRAINE HEADACHE 05/30/2007  . HYPERTENSION 05/30/2007  . Gastroesophageal reflux disease 05/30/2007    LABS    Component Value Date/Time   NA 137 03/23/2012 1035   NA 141 11/23/2011 1036   NA 133* 10/21/2011 0715    K 3.9 03/23/2012 1035   K 4.1 11/23/2011 1036   K 3.9 10/21/2011 0715   CL 104 03/23/2012 1035   CL 107 11/23/2011 1036   CL 102 10/21/2011 0715   CO2 22 03/23/2012 1035   CO2 24 11/23/2011 1036   CO2 22 10/21/2011 0715   GLUCOSE 133* 03/23/2012 1035   GLUCOSE 97 11/23/2011 1036   GLUCOSE 128* 10/21/2011 0715   BUN 24* 03/23/2012 1035   BUN 10 11/23/2011 1036   BUN 8 10/21/2011 0715   CREATININE 1.08 03/23/2012 1035   CREATININE 0.95 11/23/2011 1036   CREATININE 0.89 10/21/2011 0715   CREATININE 1.04 10/12/2011 1556   CREATININE 1.08 08/03/2011 0952   CREATININE 1.15* 10/20/2010 1150   CALCIUM 9.2 03/23/2012 1035   CALCIUM 9.7 11/23/2011 1036   CALCIUM 8.5 10/21/2011 0715   GFRNONAA 50* 03/23/2012 1035   GFRNONAA 63* 10/21/2011 0715   GFRNONAA 52* 10/12/2011 1556   GFRAA 58* 03/23/2012 1035   GFRAA 73* 10/21/2011 0715   GFRAA 61* 10/12/2011 1556   CMP     Component Value Date/Time   NA 137 03/23/2012 1035   K 3.9 03/23/2012 1035   CL 104 03/23/2012 1035   CO2 22 03/23/2012 1035   GLUCOSE 133* 03/23/2012 1035   BUN 24* 03/23/2012 1035   CREATININE 1.08 03/23/2012 1035   CREATININE 0.95 11/23/2011 1036   CALCIUM 9.2 03/23/2012 1035   PROT 7.7 10/12/2011 1556   ALBUMIN 3.8 10/12/2011 1556   AST 14 10/12/2011 1556   ALT 15 10/12/2011 1556   ALKPHOS 91 10/12/2011 1556   BILITOT 0.1* 10/12/2011 1556   GFRNONAA 50* 03/23/2012 1035   GFRAA 58* 03/23/2012 1035       Component Value Date/Time   WBC 7.4 11/23/2011 1036   WBC 13.0* 10/22/2011 0625   WBC 10.5 10/21/2011 0715   HGB 9.9* 03/23/2012 1035   HGB 10.8* 11/23/2011 1036   HGB 10.6* 10/22/2011 0625   HCT 30.7* 03/23/2012 1035   HCT 32.6* 11/23/2011 1036   HCT 31.9* 10/22/2011 0625   MCV 84.5 11/23/2011 1036   MCV 78.8 10/22/2011 0625   MCV 79.6 10/21/2011 0715    Lipid Panel     Component Value Date/Time   CHOL 156 08/03/2011 0952   TRIG 122 08/03/2011 0952   HDL 64 08/03/2011 0952   CHOLHDL 2.4 08/03/2011 0952   VLDL 24 08/03/2011 0952   LDLCALC 68 08/03/2011 0952    ABG No results found  for this basename: phart, pco2, pco2art, po2, po2art, hco3, tco2, acidbasedef, o2sat     Lab Results  Component Value Date   TSH 1.204 08/03/2011   BNP (last 3  results) No results found for this basename: PROBNP,  in the last 8760 hours Cardiac Panel (last 3 results) No results found for this basename: CKTOTAL, CKMB, TROPONINI, RELINDX,  in the last 72 hours  Iron/TIBC/Ferritin    Component Value Date/Time   IRON 158* 11/23/2011 1036   TIBC 375 11/04/2010 0000   FERRITIN 92 11/23/2011 1036     EKG Orders placed in visit on 06/21/12  . EKG 12-LEAD     Prior Assessment and Plan Problem List as of 06/21/2012     ICD-9-CM   H/O diabetes mellitus   Last Assessment & Plan   11/23/2011 Office Visit Edited 11/29/2011  2:36 PM by Kerri Perches, MD     F/u lab shows normal hBA1C, will d/c metformin    HYPERLIPIDEMIA   Last Assessment & Plan   03/28/2012 Office Visit Written 03/30/2012  7:00 PM by Kerri Perches, MD     Controlled when last checked Hyperlipidemia:Low fat diet discussed and encouraged.  Updated lab next visit    OVERWEIGHT/OBESITY   Last Assessment & Plan   03/28/2012 Office Visit Written 03/30/2012  7:01 PM by Kerri Perches, MD     Improved. Pt applauded on succesful weight loss through lifestyle change, and encouraged to continue same. Weight loss goal set for the next several months.     MIGRAINE HEADACHE   Last Assessment & Plan   08/05/2010 Office Visit Written 08/20/2010  5:14 PM by Syliva Overman, MD     pt    HYPERTENSION   Last Assessment & Plan   03/28/2012 Office Visit Written 03/30/2012  6:58 PM by Kerri Perches, MD     Sub optimal though adequate control, no med change    Gastroesophageal reflux disease   Last Assessment & Plan   10/28/2010 Office Visit Written 11/01/2010 10:08 PM by Nira Retort, NP     Breakthrough reflux despite Omeprazole 40 mg daily. Diet/obesity likely playing significant role. Will switch to Nexium, provide GERD  diet. Vague dysphagia with steak. Proceeding with EGD due to likely IDA.     OSTEOARTHRITIS, KNEE, LEFT   Last Assessment & Plan   08/05/2011 Office Visit Written 08/08/2011  2:11 PM by Kerri Perches, MD     Uncontrolled pain will start fentanyl patch    INTERMITTENT VERTIGO   Last Assessment & Plan   03/10/2011 Office Visit Written 03/13/2011 11:20 AM by Kerri Perches, MD     Uncontrolled, updated labs in Feb, continue current med    Pulmonary embolism   Last Assessment & Plan   06/21/2011 Office Visit Written 06/21/2011  5:22 PM by Kathlen Brunswick, MD      05/2011 lower extremity venous ultrasound-negative for thrombus.  No evidence for thrombophlebitis preoperatively.     Chronic anticoagulation   Last Assessment & Plan   06/21/2011 Office Visit Written 06/21/2011  5:08 PM by Kathlen Brunswick, MD     INRs have been somewhat variable, but therapeutic approximately 60-70% of determinations.  She is now one year out from pulmonary embolism, and discontinuation of anticoagulation for surgery is acceptable.  Warfarin can be held for 4 days prior to surgery with Lovenox 1.5 mg per kilogram administered twice, 24 and 48 hours before the procedure.  Warfarin can be resumed immediately postoperatively with Lovenox coverage once the risk of postoperative hemorrhage is considered minimal.  [facility] will be available as needed in-hospital.    Iron deficiency anemia   Last  Assessment & Plan   06/21/2011 Office Visit Written 06/21/2011  5:20 PM by Kathlen Brunswick, MD     Anemia is substantial and should be further investigated with iron studies and repeat stool Hemoccults.  If no etiology identified, hematology consultation would be appropriate, if not previously performed.    Chest pain   Anemia, iron deficiency   Insomnia   Anxiety   Last Assessment & Plan   11/23/2011 Office Visit Written 11/29/2011  2:34 PM by Kerri Perches, MD     Increased following recent surgery, restricted  amt of xanax short term for as needed use only    Acute bronchitis   Last Assessment & Plan   03/28/2012 Office Visit Written 03/30/2012  6:58 PM by Kerri Perches, MD     Antibiotic and decongestant and cough suppressant prescribed    Acute maxillary sinusitis   Last Assessment & Plan   03/28/2012 Office Visit Written 03/30/2012  6:59 PM by Kerri Perches, MD     Antibiotic course prescribed        Imaging: No results found.

## 2012-06-21 NOTE — Patient Instructions (Addendum)
Your physician recommends that you schedule a follow-up appointment in: 6 MONTHS  Your physician has recommended you make the following change in your medication:   1) STOP COUMADIN TODAY  Your physician recommends that you return for lab work in: Monday 06-26-12 (CBC,PT,PTT,ANEMIA PROFILE) SLIPS GIVEN IF YOUR LABS ARE OK ON Monday DR Dietrich Pates WILL START YOU ON XARELTO 20MG  ONCE DAILY WITH SUPPER, WE WILL CONTACT YOU AND PROVIDE SAMPLES AT THAT TIME.  Your Physician recommends that you complete the Hemoccult package given to you today, instructions are enclosed in your packet. Once completed please return to this officE.

## 2012-06-21 NOTE — Assessment & Plan Note (Addendum)
Patient has no known vascular disease, and thus does not clearly require an optimal lipid profile. Since she is already being treated with a statin, the dose will be increased to achieve greater efficacy.

## 2012-06-21 NOTE — Progress Notes (Signed)
Patient ID: [name], female   DOB: [birth year], 72 y.o.   MRN: [id number]  HPI: Schedule return visit for this pleasant woman with a history of pulmonary embolism, whose Coumadin therapy has been managed through this office.  Unfortunately, INRs have been variable and frequently both subtherapeutic and supratherapeutic, most recently with an INR of 6.4.  Fortunately, she has experienced no major bleeding complications and no apparent recurrent thromboembolic events.   Otherwise, her health has been good. She tolerated a left TKA some months ago. Ambulation is improved, but she continues to have some pain. A return visit to her surgeon as planned.  Patient reports generalized weakness, which may be related to her history of anemia; most recent H&H was 9.9/30.7 in 03/2012. Previously, iron deficiency was documented and GI treatment undertaken, which was reportedly effective in transforming stool from Hemoccult to negative.  Testing was initially highly suggestive of iron deficiency, but iron and ferritin measured in 11/2011 were normal.  Current Outpatient Prescriptions  Medication Sig Dispense Refill  . acetaminophen (ACETAMIN) 500 MG tablet Take 500 mg by mouth every 6 (six) hours as needed. Leg pain (OTC)      . ALPRAZolam (XANAX) 0.25 MG tablet One half to one tablet once daily, as needed, for anxiety.  30 tablet  0  . amLODipine (NORVASC) 10 MG tablet Take 1 tablet (10 mg total) by mouth daily.  90 tablet  1  . furosemide (LASIX) 40 MG tablet Take 20-40 mg by mouth daily as needed. Take one half to one tablet by mouth once daily as needed for fluid retention      . imipramine (TOFRANIL) 50 MG tablet Take 2 tablets (100 mg total) by mouth at bedtime.  30 tablet  0  . losartan-hydrochlorothiazide (HYZAAR) 50-12.5 MG per tablet Take 1 tablet by mouth daily.  90 tablet  1  . omeprazole (PRILOSEC) 20 MG capsule Take 1 capsule (20 mg total) by mouth 2 (two) times daily.  180 capsule  1  . penicillin  v potassium (VEETID) 500 MG tablet Take 1 tablet (500 mg total) by mouth 3 (three) times daily.  30 tablet  0  . pravastatin (PRAVACHOL) 20 MG tablet Take 1 tablet (20 mg total) by mouth every evening.  90 tablet  1  . propranolol (INDERAL) 80 MG tablet Take 1 tablet (80 mg total) by mouth daily.  90 tablet  1  . topiramate (TOPAMAX) 100 MG tablet Take 1 tablet (100 mg total) by mouth 2 (two) times daily.  30 tablet  0  . warfarin (COUMADIN) 7.5 MG tablet Take 3.75-7.5 mg by mouth daily. Takes 7.5 mg (1 tablet) on Sundays, Tuesdays and Thursdays Takes 3.75 MG   (1/2 tablet) on Mondays, Wednesdays, Fridays and Saturdays Always takes at 1700 hours       No current facility-administered medications for this visit.   Facility-Administered Medications Ordered in Other Visits  Medication Dose Route Frequency Provider Last Rate Last Dose  . fentaNYL (SUBLIMAZE) injection 25-50 mcg  25-50 mcg Intravenous Q5 min PRN Laurene Footman, MD       Allergies  Allergen Reactions  . Fentanyl Itching    Patient just started the patch on 08-08-2011 and she has noted itching, but states that it is helping.  . Sulfonamide Derivatives Hives  . Codeine Itching and Rash     Past medical history, social history, and family history reviewed and updated.  ROS: Denies chest pain, dyspnea, pedal edema, palpitations, lightheadedness or syncope.  All other systems reviewed, and are negative.  PHYSICAL EXAM: BP 122/74  Pulse 63  Ht 5\' 5"  (1.651 m)  Wt 93.495 kg (206 lb 1.9 oz)  BMI 34.3 kg/m2;  Body mass index is 34.3 kg/(m^2). General-Well developed; no acute distress Body habitus-moderately overweight Neck-No JVD; no carotid bruits Lungs-clear lung fields; resonant to percussion Cardiovascular-normal PMI; normal S1 and S2; regular rhythm; S4 present Abdomen-normal bowel sounds; soft and non-tender without masses or organomegaly Musculoskeletal-No deformities, no cyanosis or clubbing Neurologic-Normal cranial  nerves; symmetric strength and tone Skin-Warm, no significant lesions Extremities-distal pulses intact; no edema  EKG: Normal sinus rhythm; frequent PACs; counterclockwise rotation in the frontal plane; minimal nonspecific T wave abnormality; no previous tracing for comparison.  Colfax Bing, MD 06/21/2012  2:35 PM  ASSESSMENT AND PLAN

## 2012-06-21 NOTE — Assessment & Plan Note (Signed)
Although serum iron studies normalized as of late 2013, anemia was still present 3 months ago. Basic laboratory tests will be repeated and referred to Dr. Lodema Hong for further evaluation and treatment. She may require referral to Hematology.

## 2012-06-22 ENCOUNTER — Encounter: Payer: Self-pay | Admitting: Cardiology

## 2012-06-26 ENCOUNTER — Telehealth: Payer: Self-pay | Admitting: *Deleted

## 2012-06-26 ENCOUNTER — Other Ambulatory Visit: Payer: Self-pay | Admitting: *Deleted

## 2012-06-26 ENCOUNTER — Other Ambulatory Visit: Payer: Self-pay

## 2012-06-26 DIAGNOSIS — I2699 Other pulmonary embolism without acute cor pulmonale: Secondary | ICD-10-CM

## 2012-06-26 DIAGNOSIS — I10 Essential (primary) hypertension: Secondary | ICD-10-CM

## 2012-06-26 DIAGNOSIS — Z7901 Long term (current) use of anticoagulants: Secondary | ICD-10-CM

## 2012-06-26 DIAGNOSIS — D649 Anemia, unspecified: Secondary | ICD-10-CM

## 2012-06-26 DIAGNOSIS — E785 Hyperlipidemia, unspecified: Secondary | ICD-10-CM

## 2012-06-26 DIAGNOSIS — E782 Mixed hyperlipidemia: Secondary | ICD-10-CM

## 2012-06-26 LAB — CBC
HCT: 28.1 % — ABNORMAL LOW (ref 36.0–46.0)
Hemoglobin: 9.1 g/dL — ABNORMAL LOW (ref 12.0–15.0)
RDW: 17.8 % — ABNORMAL HIGH (ref 11.5–15.5)
WBC: 5.3 10*3/uL (ref 4.0–10.5)

## 2012-06-26 LAB — PROTIME-INR
INR: 1.1 (ref <1.50)
Prothrombin Time: 14.2 seconds (ref 11.6–15.2)

## 2012-06-26 LAB — IRON AND TIBC: UIBC: 362 ug/dL (ref 125–400)

## 2012-06-26 MED ORDER — PROPRANOLOL HCL 80 MG PO TABS: 80.0000 mg | ORAL_TABLET | Freq: Every day | ORAL | Status: DC

## 2012-06-26 MED ORDER — AMLODIPINE BESYLATE 10 MG PO TABS: 10.0000 mg | ORAL_TABLET | Freq: Every day | ORAL | Status: DC

## 2012-06-26 MED ORDER — LOSARTAN POTASSIUM-HCTZ 50-12.5 MG PO TABS: 1.0000 | ORAL_TABLET | Freq: Every day | ORAL | Status: DC

## 2012-06-26 MED ORDER — PRAVASTATIN SODIUM 80 MG PO TABS: 80.0000 mg | ORAL_TABLET | Freq: Every evening | ORAL | Status: DC

## 2012-06-26 MED ORDER — TOPIRAMATE 100 MG PO TABS: 100.0000 mg | ORAL_TABLET | Freq: Two times a day (BID) | ORAL | Status: DC

## 2012-06-26 MED ORDER — PRAVASTATIN SODIUM 20 MG PO TABS: 20.0000 mg | ORAL_TABLET | Freq: Every evening | ORAL | Status: DC

## 2012-06-26 NOTE — Telephone Encounter (Signed)
Spoke to pt to advise MD RR changed her Rx for Pravastatin to 80mg  once daily, pt advised she has enough pills to last until prime mail can send her more, pt takes 20mg  currently and will take 4 pills to equal the 80mg  new dosage, pt informed Your physician recommends that you have follow up lab work, we will mail you a reminder letter to alert you when to go [employer], located across the street from our office.  Patient orders and reminders have been placed in the patients chart to be released for future reference

## 2012-06-26 NOTE — Telephone Encounter (Signed)
Noted pt labs to be drawn per RR on 06-21-12 OV noted not covered by pt insurance, [facility] unable to read standing order from Epic, placed all new orders with adjusted DX codes per standing order codes entered by [facility] on 2011/07/22 for standing PT, was advised the codes received will now cover per placed new orders in chart

## 2012-06-28 ENCOUNTER — Other Ambulatory Visit: Payer: Self-pay | Admitting: *Deleted

## 2012-06-28 ENCOUNTER — Encounter: Payer: Self-pay | Admitting: Cardiology

## 2012-06-28 MED ORDER — PRAVASTATIN SODIUM 80 MG PO TABS: 80.0000 mg | ORAL_TABLET | Freq: Every evening | ORAL | Status: DC

## 2012-06-29 ENCOUNTER — Other Ambulatory Visit: Payer: Self-pay | Admitting: *Deleted

## 2012-06-29 MED ORDER — PRAVASTATIN SODIUM 80 MG PO TABS: 80.0000 mg | ORAL_TABLET | Freq: Every evening | ORAL | Status: DC

## 2012-07-03 ENCOUNTER — Telehealth: Payer: Self-pay | Admitting: *Deleted

## 2012-07-03 MED ORDER — RIVAROXABAN 20 MG PO TABS: 20.0000 mg | ORAL_TABLET | Freq: Every day | ORAL | Status: DC

## 2012-07-03 NOTE — Telephone Encounter (Signed)
Called pt 5/16 to discuss Xarelto.  Coumadin was stopped last OV due to fluctuations in INR's.  Labs were check on 5/12 and she was started on Xarelto.  ( Hcb 9.1  Hct 28.1  CrCl 69.4 ---Hgb results to Dr Lodema Hong and Dr Karilyn Cota per Dr Dietrich Pates)   Pt wants to continue Xarelto so Rx sent to [facility] for 90 day supply.  Called pharmacy and they said her cost would be $75 for 3months.  Pt will come by office for more samples until Rx arrives.

## 2012-07-04 ENCOUNTER — Telehealth: Payer: Self-pay | Admitting: *Deleted

## 2012-07-04 NOTE — Telephone Encounter (Signed)
SEE PHONE NOTE FOR 07-03-12

## 2012-07-04 NOTE — Telephone Encounter (Signed)
Pt daughter Elita Quick called to clarify pt information given per Xarelto and pt noted as confused, gave Pam instructions, placed samples up front for pick up, pam understood all instructions given

## 2012-07-11 ENCOUNTER — Ambulatory Visit: Payer: Medicare Other | Admitting: Family Medicine

## 2012-07-11 ENCOUNTER — Encounter (INDEPENDENT_AMBULATORY_CARE_PROVIDER_SITE_OTHER): Payer: Self-pay | Admitting: Internal Medicine

## 2012-07-11 ENCOUNTER — Ambulatory Visit (INDEPENDENT_AMBULATORY_CARE_PROVIDER_SITE_OTHER): Payer: Medicare Other | Admitting: Internal Medicine

## 2012-07-11 VITALS — BP 110/70 | HR 68 | Temp 97.0°F | Resp 18 | Ht 67.0 in | Wt 204.4 lb

## 2012-07-11 DIAGNOSIS — D649 Anemia, unspecified: Secondary | ICD-10-CM

## 2012-07-11 DIAGNOSIS — D509 Iron deficiency anemia, unspecified: Secondary | ICD-10-CM

## 2012-07-11 DIAGNOSIS — M25562 Pain in left knee: Secondary | ICD-10-CM

## 2012-07-11 DIAGNOSIS — M25569 Pain in unspecified knee: Secondary | ICD-10-CM

## 2012-07-11 DIAGNOSIS — K219 Gastro-esophageal reflux disease without esophagitis: Secondary | ICD-10-CM

## 2012-07-11 LAB — HEMOGLOBIN AND HEMATOCRIT, BLOOD: Hemoglobin: 9.3 g/dL — ABNORMAL LOW (ref 12.0–15.0)

## 2012-07-11 MED ORDER — HYDROCODONE-ACETAMINOPHEN 5-300 MG PO TABS: 1.0000 | ORAL_TABLET | Freq: Two times a day (BID) | ORAL | Status: DC | PRN

## 2012-07-11 NOTE — Patient Instructions (Signed)
Do not take ibuprofen or other over-the-counter arthritis. Hydrocodone/acetaminophen 5/300 one tablet by mouth twice daily as needed for pain. Can take Tylenol up to 2 g per day as needed. Physician will contact you with results of blood work.

## 2012-07-11 NOTE — Progress Notes (Signed)
Presenting complaint;  Low hemoglobin.  Subjective:  Patient is 72 year old African female who has has history of GI bleed and iron deficiency anemia. She also has history of pulmonary embolism and was on warfarin with difficulty maintaining her INR with a narrow range. She was therefore switched to Xarelto by Dr. Dietrich Pates her cardiologist. Dr. Dietrich Pates checked her CBC on 06/26/2012 and her H&H was 9.1 and 28.1 with MCV of 72.4. This visit was therefore arranged. 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 She has been using ibuprofen on regular basis but not daily. Her stools have been dark because she takes iron twice daily. She denies rectal bleeding. She denies abdominal pain nausea or vomiting. She also denies hematuria or vaginal bleeding.   Current Medications: Current Outpatient Prescriptions  Medication Sig Dispense Refill  . acetaminophen (ACETAMIN) 500 MG tablet Take 500 mg by mouth every 6 (six) hours as needed. Leg pain (OTC)      . amLODipine (NORVASC) 10 MG tablet Take 1 tablet (10 mg total) by mouth daily.  90 tablet  1  . Ferrous Sulfate (IRON) 325 (65 FE) MG TABS Take by mouth 2 (two) times daily.      . furosemide (LASIX) 40 MG tablet Take 20-40 mg by mouth daily as needed. Take one half to one tablet by mouth once daily as needed for fluid retention      . imipramine (TOFRANIL) 50 MG tablet Take 2 tablets (100 mg total) by mouth at bedtime.  30 tablet  0  . losartan-hydrochlorothiazide (HYZAAR) 50-12.5 MG per tablet Take 1 tablet by mouth daily.  90 tablet  1  . meclizine (ANTIVERT) 25 MG tablet Take 25 mg by mouth as needed.      Marland Kitchen omeprazole (PRILOSEC) 20 MG capsule Take 1 capsule (20 mg total) by mouth 2 (two) times daily.  180 capsule  1  . pravastatin (PRAVACHOL) 80 MG tablet Take 1 tablet (80 mg total) by mouth every evening.  90 tablet  3  . promethazine (PHENERGAN) 25 MG tablet Take 25 mg by mouth as needed for nausea.      .  propranolol (INDERAL) 80 MG tablet Take 1 tablet (80 mg total) by mouth daily.  90 tablet  1  . Rivaroxaban (XARELTO) 20 MG TABS Take 1 tablet (20 mg total) by mouth daily with supper.  90 tablet  3  . temazepam (RESTORIL) 30 MG capsule Take 30 mg by mouth at bedtime as needed for sleep.      Marland Kitchen topiramate (TOPAMAX) 100 MG tablet Take 1 tablet (100 mg total) by mouth 2 (two) times daily.  30 tablet  0   No current facility-administered medications for this visit.   Facility-Administered Medications Ordered in Other Visits  Medication Dose Route Frequency Provider Last Rate Last Dose  . fentaNYL (SUBLIMAZE) injection 25-50 mcg  25-50 mcg Intravenous Q5 min PRN Laurene Footman, MD       Past medical history; Past Medical History  Diagnosis Date  . Hyperlipidemia     takes Pravastatin daily  . Overweight(278.02)   . Depression   . Migraine   . Degenerative joint disease     Knees  . Glaucoma   . Pulmonary embolism May 2012    acute presentation, bilateral PE  . Chronic anticoagulation 07/21/2010  . Anemia, iron deficiency 2012    Evaluated by Dr. Darrick Penna; H&H of 9.3/30.8 with MCV-79 in 10/2010; 3/3 positive Hemoccult cards in 07/2011  .  Atrial fibrillation     takes Coumadin daily  . Hypertension     takes Hyzaar daily and Propranolol   . Pneumonia 1969    hx of  . Hx of migraines     last one about a yr ago;takes Topamax daily  . Joint pain   . Joint swelling   . Chronic back pain     related to knee pain  . History of gout     was on medication but taken off of several months ago  . Bruises easily     takes Coumadin  . Skin spots-aging   . Gastroesophageal reflux disease     takes Omeprazole daily  . Hx of colonic polyps   . Urinary frequency   . Nocturia   . History of blood transfusion 1969  . Diabetes mellitus     takes Metformin daily  . Glaucoma     uses eye drops at night  . Anxiety     was on Prozac for a couple of weeks  . Insomnia     takes Restoril prn    patient underwent EGD and colonoscopy by Dr. Darrick Penna in November 2012. Her esophagus was dilated. Biopsy from stomach was negative for H. pylori and duodenal biopsy did not show changes of celiac disease. Colonoscopy revealed 3 small tubular adenomas and rare diverticula and hemorrhoids. She was reevaluated by me in July 2013 for iron deficiency anemia and heme positive stools. 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   Objective: Blood pressure 110/70, pulse 68, temperature 97 F (36.1 C), temperature source Oral, resp. rate 18, height 5\' 7"  (1.702 m), weight 204 lb 6.4 oz (92.715 kg). Patient is alert and in no acute distress. Conjunctiva is pink. Sclera is nonicteric Oropharyngeal mucosa is normal. No neck masses or thyromegaly noted. Cardiac exam with regular rhythm normal S1 and S2. No murmur or gallop noted. Lungs are clear to auscultation. Abdomen is full but soft and nontender without organomegaly or masses. Rectal examination reveals brown but heme positive stool.  Left knee is swollen and warm when compared to the right knee.  No LE edema or clubbing noted.   Labs/studies Results: CBC as above.  Assessment:  #1. Iron deficiency anemia. This dates back to 2012. 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 Patient is now on Xeralto and also taking ibuprofen. Her stool is guaiac positive today. She therefore could have NSAID-induced injury 2 upper GI tract or small bowel resulting in GI blood loss. If hemoglobin drops upper GI tract may have to be reevaluated endoscopically. #2. Chronic GERD. She is on double dose PPI. #3. Persistent left knee pain. Patient is status post knee replacement in September last year. Patient needs to  followup with Dr. Eulah Pont. In the meantime she should use pain medication and refrain from using NSAIDs.   Plan:  Patient advised not to take ibuprofen anymore. Vicodin 5/300 one by mouth twice a day when necessary. Prescription given for 60 doses. Patient will go to the lab for H&H. Continue ferrous sulfate 325 mg by mouth twice a day. Office visit in 2 months.

## 2012-07-12 ENCOUNTER — Other Ambulatory Visit (INDEPENDENT_AMBULATORY_CARE_PROVIDER_SITE_OTHER): Payer: Self-pay | Admitting: Internal Medicine

## 2012-07-12 MED ORDER — CIPROFLOXACIN HCL 250 MG PO TABS: 250.0000 mg | ORAL_TABLET | Freq: Two times a day (BID) | ORAL | Status: DC

## 2012-07-13 ENCOUNTER — Telehealth (INDEPENDENT_AMBULATORY_CARE_PROVIDER_SITE_OTHER): Payer: Self-pay | Admitting: *Deleted

## 2012-07-13 NOTE — Telephone Encounter (Signed)
I called the [facility] [address] at the patient's request and gave them the order for the Cipro 250 mg Take 1 by mouth twice daily #10 , no refills. [facility] should be made aware so that they can reverse the prescription.

## 2012-07-13 NOTE — Telephone Encounter (Signed)
Per Gardiner Ramus, she is calling [facility] to cancel the Rx sent to them by Dr. Karilyn Cota. Patient advised that the new [facility] has been [facility] to [facility] and voices understood.

## 2012-07-13 NOTE — Telephone Encounter (Signed)
Dr. Karilyn Cota sent [name]'s Cipro to [facility] and she is needing it sent to [facility].

## 2012-07-18 ENCOUNTER — Ambulatory Visit (INDEPENDENT_AMBULATORY_CARE_PROVIDER_SITE_OTHER): Payer: Medicare Other | Admitting: *Deleted

## 2012-07-18 ENCOUNTER — Ambulatory Visit (INDEPENDENT_AMBULATORY_CARE_PROVIDER_SITE_OTHER): Payer: Medicare Other | Admitting: Family Medicine

## 2012-07-18 ENCOUNTER — Encounter: Payer: Self-pay | Admitting: Family Medicine

## 2012-07-18 VITALS — BP 136/78 | HR 76 | Resp 18 | Ht 65.0 in | Wt 206.1 lb

## 2012-07-18 DIAGNOSIS — IMO0002 Reserved for concepts with insufficient information to code with codable children: Secondary | ICD-10-CM

## 2012-07-18 DIAGNOSIS — D509 Iron deficiency anemia, unspecified: Secondary | ICD-10-CM

## 2012-07-18 DIAGNOSIS — N390 Urinary tract infection, site not specified: Secondary | ICD-10-CM

## 2012-07-18 DIAGNOSIS — M1710 Unilateral primary osteoarthritis, unspecified knee: Secondary | ICD-10-CM

## 2012-07-18 DIAGNOSIS — E785 Hyperlipidemia, unspecified: Secondary | ICD-10-CM

## 2012-07-18 DIAGNOSIS — Z7901 Long term (current) use of anticoagulants: Secondary | ICD-10-CM

## 2012-07-18 DIAGNOSIS — N3090 Cystitis, unspecified without hematuria: Secondary | ICD-10-CM

## 2012-07-18 DIAGNOSIS — I10 Essential (primary) hypertension: Secondary | ICD-10-CM

## 2012-07-18 LAB — POCT URINALYSIS DIPSTICK
Glucose, UA: NEGATIVE
Nitrite, UA: NEGATIVE
Urobilinogen, UA: 0.2

## 2012-07-18 LAB — POC HEMOCCULT BLD/STL (HOME/3-CARD/SCREEN): Card #3 Fecal Occult Blood, POC: NEGATIVE

## 2012-07-18 NOTE — Assessment & Plan Note (Signed)
Abnormal UA, will send for c/s before prescribing antibiotic, just had 5 day course of cipro

## 2012-07-18 NOTE — Assessment & Plan Note (Signed)
Uncontrolled pain,icodin or tylenol only

## 2012-07-18 NOTE — Progress Notes (Signed)
  Subjective:    Patient ID: [name], female    DOB: [birth year], 72 y.o.   MRN: [id number]  HPI The PT is here for follow up and re-evaluation of chronic medical conditions, medication management and review of any available recent lab and radiology data.  Preventive health is updated, specifically  Cancer screening and Immunization.   Questions or concerns regarding consultations or procedures which the PT has had in the interim are  Addressed.Recently found to be significantly anemic by cardiology, was re evaluated fully by GI earlier this year, she is on a blood thinner and has been using ibuprofen intermittently for uncontrolled back and knee pain. Repeat HB showed improvement since stopping the ibuprofen and starting iron. She understands to use only tylenol or vicodin for pain, and continue the iron. States last  week and still continuing this week she is experiencing urinary frequency with dysuria, denies fever , chills or flank pain States she had been feeling weak when seen by cardiology recently and at that time, she was found to be anemic         Review of Systems See HPI Denies recent fever or chills. Denies sinus pressure, nasal congestion, ear pain or sore throat. Denies chest congestion, productive cough or wheezing. Denies chest pains, palpitations and leg swelling Denies abdominal pain, nausea, vomiting,diarrhea or constipation.    Denies headaches, seizures, numbness, or tingling. Denies depression, anxiety or insomnia. Denies skin break down or rash.        Objective:   Physical Exam  Patient alert and oriented and in no cardiopulmonary distress.  HEENT: No facial asymmetry, EOMI, no sinus tenderness,  oropharynx pink and moist.  Neck supple no adenopathy.  Chest: Clear to auscultation bilaterally.  CVS: S1, S2 no murmurs, no S3.  ABD: Soft non tender. Bowel sounds normal.  Ext: No edema  MS: Decreased  ROM spine,  and knees.  Skin: Intact, no  ulcerations or rash noted.  Psych: Good eye contact, normal affect. Memory intact not anxious or depressed appearing.  CNS: CN 2-12 intact, power, tone and sensation normal throughout.       Assessment & Plan:

## 2012-07-18 NOTE — Patient Instructions (Addendum)
F/u in 3.5 month  CBC , iron and ferritin mid July  Please listen your body, if you start feeling weak, or stool gets very black or smelly, then call us , we will check your blood sooner if need be.  If when you have stool you see blood in the commode you need to go to the ED  Do NOT use anything other than tylenol or vicodin as prescribed by Dr Karilyn Cota for arthritic pain  Urine will be tested for infection today since you have symptoms

## 2012-07-18 NOTE — Assessment & Plan Note (Signed)
sligght improvement on supplemental iron, pt to contoinue same and rept lab in 6 weeks. No NSAID or steroid use

## 2012-07-18 NOTE — Assessment & Plan Note (Signed)
Hyperlipidemia:Low fat diet discussed and encouraged.  Updated lab needed 

## 2012-07-18 NOTE — Assessment & Plan Note (Signed)
Controlled, no change in medication  

## 2012-07-20 ENCOUNTER — Encounter: Payer: Self-pay | Admitting: *Deleted

## 2012-07-21 ENCOUNTER — Other Ambulatory Visit: Payer: Self-pay

## 2012-07-21 ENCOUNTER — Other Ambulatory Visit: Payer: Self-pay | Admitting: Family Medicine

## 2012-07-21 LAB — URINE CULTURE: Colony Count: >=100000

## 2012-07-21 MED ORDER — NITROFURANTOIN MONOHYD MACRO 100 MG PO CAPS: 100.0000 mg | ORAL_CAPSULE | Freq: Two times a day (BID) | ORAL | Status: DC

## 2012-07-31 ENCOUNTER — Other Ambulatory Visit: Payer: Self-pay | Admitting: Cardiology

## 2012-07-31 ENCOUNTER — Ambulatory Visit (INDEPENDENT_AMBULATORY_CARE_PROVIDER_SITE_OTHER): Payer: Medicare Other | Admitting: *Deleted

## 2012-07-31 DIAGNOSIS — I10 Essential (primary) hypertension: Secondary | ICD-10-CM

## 2012-07-31 DIAGNOSIS — I2699 Other pulmonary embolism without acute cor pulmonale: Secondary | ICD-10-CM

## 2012-07-31 DIAGNOSIS — D619 Aplastic anemia, unspecified: Secondary | ICD-10-CM

## 2012-07-31 DIAGNOSIS — Z7901 Long term (current) use of anticoagulants: Secondary | ICD-10-CM

## 2012-07-31 DIAGNOSIS — I2782 Chronic pulmonary embolism: Secondary | ICD-10-CM

## 2012-08-01 ENCOUNTER — Encounter: Payer: Self-pay | Admitting: *Deleted

## 2012-08-01 ENCOUNTER — Encounter: Payer: Self-pay | Admitting: Cardiology

## 2012-08-01 LAB — CBC
MCH: 25.4 pg — ABNORMAL LOW (ref 26.0–34.0)
MCHC: 31.1 g/dL (ref 30.0–36.0)
MCV: 81.8 fL (ref 78.0–100.0)
Platelets: 369 10*3/uL (ref 150–400)
RDW: 23.1 % — ABNORMAL HIGH (ref 11.5–15.5)

## 2012-08-01 LAB — BASIC METABOLIC PANEL
Calcium: 9.4 mg/dL (ref 8.4–10.5)
Creat: 1.23 mg/dL — ABNORMAL HIGH (ref 0.50–1.10)

## 2012-08-02 NOTE — Patient Instructions (Signed)
Pt was started on Xarelto for PE on 06/26/12 by Dr Dietrich Pates.    Reviewed patients medication list.  Pt is not currently on any combined P-gp and strong CYP3A4 inhibitors/inducers (ketoconazole, traconazole, ritonavir, carbamazepine, phenytoin, rifampin, St. John's wort).  Reviewed labs.  SCr 1.23, Weight 94kg, CrCl- 61.35.  Xarelto 20mg  daily with evening meal is appropriate dose based on CrCl.   Hgb and HCT WNL on 07/31/12  Hgb 10.5/Hct 33.8 which has improved since 06/26/12.  A full discussion of the nature of anticoagulants has been carried out.  A benefit/risk analysis has been presented to the patient, so that they understand the justification for choosing anticoagulation with Xarelto at this time.  The need for compliance is stressed.  Pt is aware to take the medication once daily with the largest meal of the day.  Side effects of potential bleeding are discussed, including unusual colored urine or stools, coughing up blood or coffee ground emesis, nose bleeds or serious fall or head trauma.  Discussed signs and symptoms of stroke. The patient should avoid any OTC items containing aspirin or ibuprofen.  Avoid alcohol consumption.   Call if any signs of abnormal bleeding.  Discussed financial obligations and resolved any difficulty in obtaining medication.  Next lab test test in 6 months.

## 2012-08-16 ENCOUNTER — Telehealth: Payer: Self-pay | Admitting: Family Medicine

## 2012-08-17 ENCOUNTER — Other Ambulatory Visit: Payer: Self-pay

## 2012-08-17 MED ORDER — PRAVASTATIN SODIUM 80 MG PO TABS: 80.0000 mg | ORAL_TABLET | Freq: Every evening | ORAL | Status: DC

## 2012-08-17 NOTE — Telephone Encounter (Signed)
Called [facility].  Clarified medicine and medicine will be sent to patient.

## 2012-08-25 ENCOUNTER — Other Ambulatory Visit: Payer: Self-pay | Admitting: Family Medicine

## 2012-08-25 ENCOUNTER — Other Ambulatory Visit: Payer: Self-pay

## 2012-08-25 ENCOUNTER — Telehealth: Payer: Self-pay | Admitting: Family Medicine

## 2012-08-25 MED ORDER — PRAVASTATIN SODIUM 80 MG PO TABS: 80.0000 mg | ORAL_TABLET | Freq: Every evening | ORAL | Status: DC

## 2012-08-25 NOTE — Telephone Encounter (Signed)
Notified pt that med was called in to [facility] on 7-3 and should have been shipped.  She states that she will follow up with [facility] but requests that a supply be sent locally as well.  30 day supply sent to [facility]

## 2012-09-03 IMAGING — CR DG CHEST 1V PORT
1 series · 1 of 1 positions shown · non-contrast
Comparison: Chest x-ray of 07/10/2010

CLINICAL DATA: Chest pain, shortness of breath today

PORTABLE CHEST - 1 VIEW

[view not recorded]
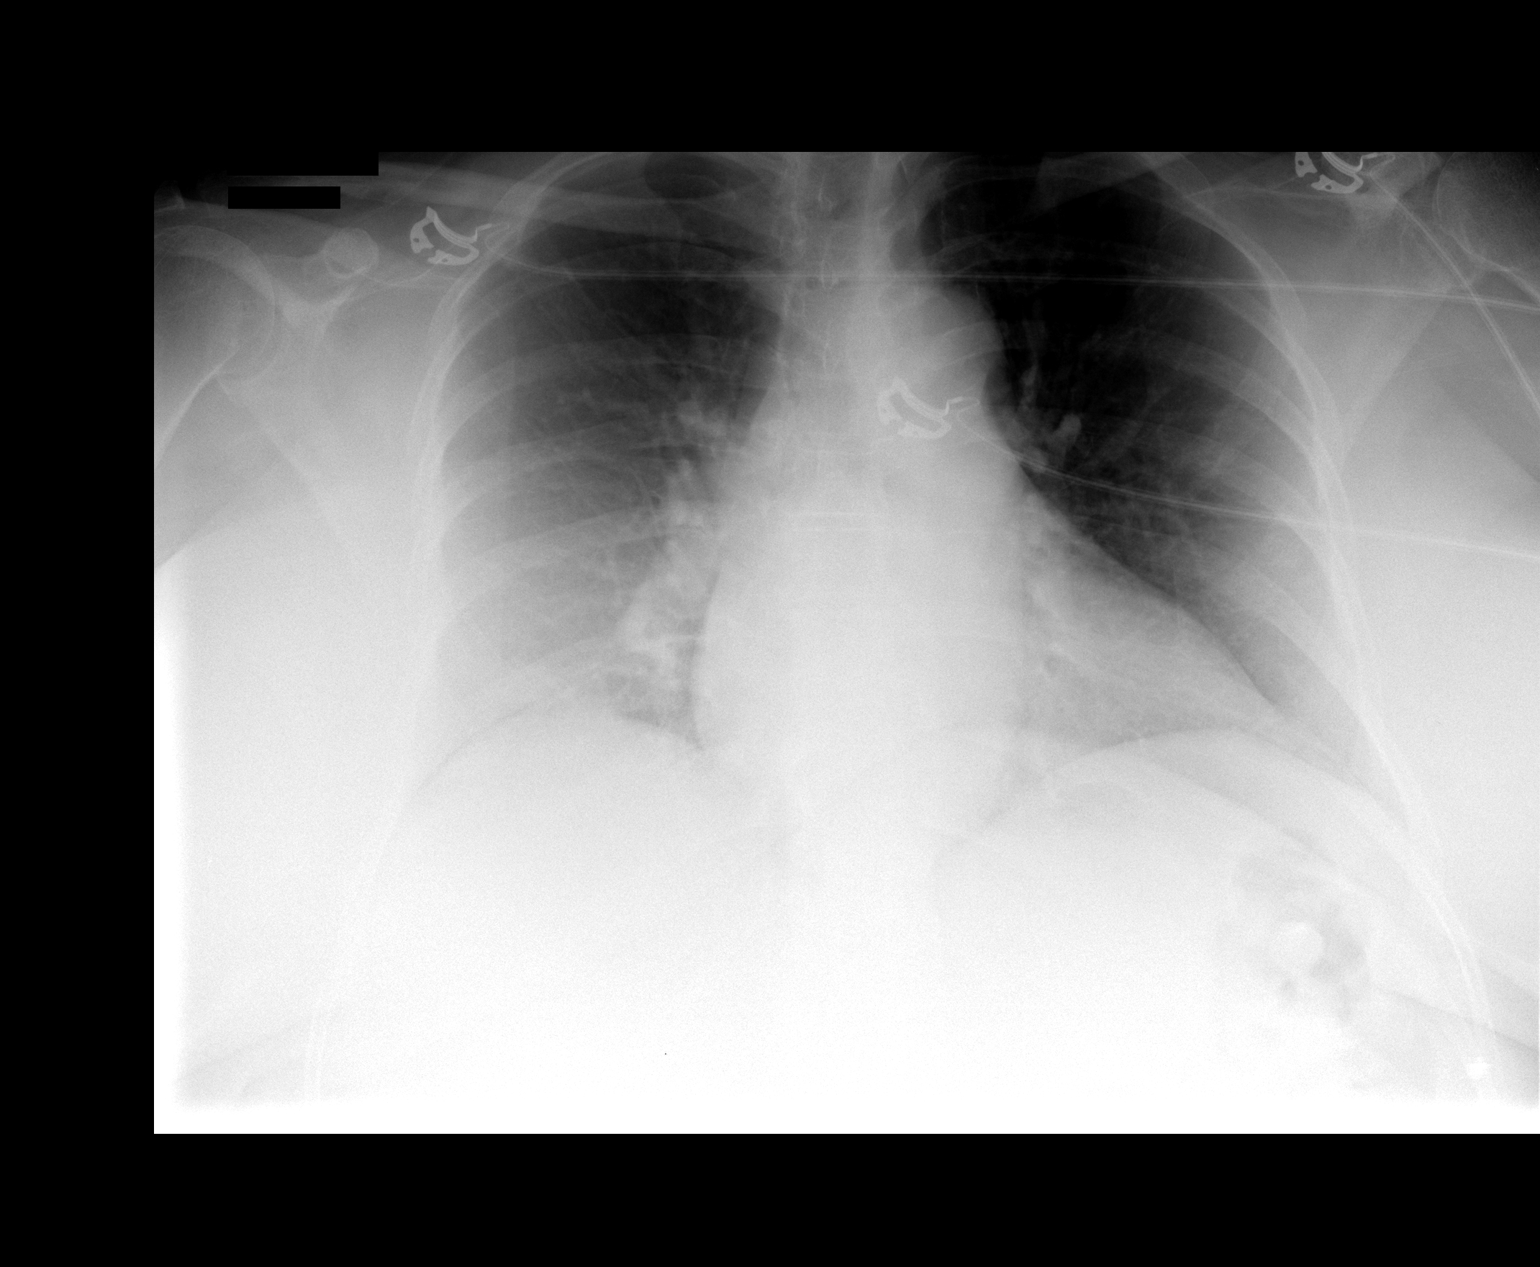

[1 of 1 positions shown; findings below may reference images not displayed]

FINDINGS: The lungs are not as well aerated with mild volume loss
at the bases.  No active infiltrate or effusion is seen.  Mild
cardiomegaly is stable.  No bony abnormality is seen.
IMPRESSION: Suboptimal inspiration with mild basilar atelectasis on the right.
Stable cardiomegaly.

## 2012-09-05 ENCOUNTER — Ambulatory Visit (INDEPENDENT_AMBULATORY_CARE_PROVIDER_SITE_OTHER): Payer: Medicare Other | Admitting: Internal Medicine

## 2012-09-07 ENCOUNTER — Other Ambulatory Visit: Payer: Self-pay | Admitting: *Deleted

## 2012-09-07 ENCOUNTER — Encounter: Payer: Self-pay | Admitting: *Deleted

## 2012-09-07 DIAGNOSIS — E782 Mixed hyperlipidemia: Secondary | ICD-10-CM

## 2012-09-08 ENCOUNTER — Other Ambulatory Visit: Payer: Self-pay | Admitting: *Deleted

## 2012-09-08 ENCOUNTER — Telehealth: Payer: Self-pay | Admitting: Cardiology

## 2012-09-08 ENCOUNTER — Telehealth: Payer: Self-pay | Admitting: Family Medicine

## 2012-09-08 MED ORDER — IMIPRAMINE HCL 50 MG PO TABS: 100.0000 mg | ORAL_TABLET | Freq: Every day | ORAL | Status: DC

## 2012-09-08 MED ORDER — RIVAROXABAN 20 MG PO TABS: 20.0000 mg | ORAL_TABLET | Freq: Every day | ORAL | Status: DC

## 2012-09-08 NOTE — Telephone Encounter (Signed)
Refill on Xarelto to [facility]. / [facility]

## 2012-09-08 NOTE — Telephone Encounter (Signed)
Med sent and called [facility] office to let them know patient was requesting refill of xarelto. They said they would refill

## 2012-10-31 ENCOUNTER — Other Ambulatory Visit: Payer: Self-pay | Admitting: Family Medicine

## 2012-10-31 LAB — CBC
MCH: 27.9 pg (ref 26.0–34.0)
MCHC: 32.7 g/dL (ref 30.0–36.0)
MCV: 85.5 fL (ref 78.0–100.0)
Platelets: 341 10*3/uL (ref 150–400)
RBC: 4.08 MIL/uL (ref 3.87–5.11)
RDW: 15.5 % (ref 11.5–15.5)

## 2012-11-01 ENCOUNTER — Ambulatory Visit (INDEPENDENT_AMBULATORY_CARE_PROVIDER_SITE_OTHER): Payer: Medicare Other | Admitting: Family Medicine

## 2012-11-01 ENCOUNTER — Encounter: Payer: Self-pay | Admitting: Family Medicine

## 2012-11-01 ENCOUNTER — Telehealth: Payer: Self-pay | Admitting: Family Medicine

## 2012-11-01 VITALS — BP 114/80 | HR 63 | Resp 16 | Wt 203.0 lb

## 2012-11-01 DIAGNOSIS — F341 Dysthymic disorder: Secondary | ICD-10-CM

## 2012-11-01 DIAGNOSIS — Z8639 Personal history of other endocrine, nutritional and metabolic disease: Secondary | ICD-10-CM

## 2012-11-01 DIAGNOSIS — R413 Other amnesia: Secondary | ICD-10-CM

## 2012-11-01 DIAGNOSIS — Z862 Personal history of diseases of the blood and blood-forming organs and certain disorders involving the immune mechanism: Secondary | ICD-10-CM

## 2012-11-01 DIAGNOSIS — Z23 Encounter for immunization: Secondary | ICD-10-CM

## 2012-11-01 DIAGNOSIS — M25562 Pain in left knee: Secondary | ICD-10-CM | POA: Insufficient documentation

## 2012-11-01 DIAGNOSIS — I10 Essential (primary) hypertension: Secondary | ICD-10-CM

## 2012-11-01 DIAGNOSIS — E785 Hyperlipidemia, unspecified: Secondary | ICD-10-CM

## 2012-11-01 DIAGNOSIS — R7309 Other abnormal glucose: Secondary | ICD-10-CM

## 2012-11-01 DIAGNOSIS — M25569 Pain in unspecified knee: Secondary | ICD-10-CM

## 2012-11-01 DIAGNOSIS — K219 Gastro-esophageal reflux disease without esophagitis: Secondary | ICD-10-CM

## 2012-11-01 DIAGNOSIS — R7302 Impaired glucose tolerance (oral): Secondary | ICD-10-CM | POA: Insufficient documentation

## 2012-11-01 DIAGNOSIS — E663 Overweight: Secondary | ICD-10-CM

## 2012-11-01 DIAGNOSIS — F418 Other specified anxiety disorders: Secondary | ICD-10-CM

## 2012-11-01 DIAGNOSIS — G43909 Migraine, unspecified, not intractable, without status migrainosus: Secondary | ICD-10-CM

## 2012-11-01 LAB — RPR

## 2012-11-01 LAB — COMPREHENSIVE METABOLIC PANEL
Albumin: 4.4 g/dL (ref 3.5–5.2)
CO2: 29 mEq/L (ref 19–32)
Calcium: 9.9 mg/dL (ref 8.4–10.5)
Glucose, Bld: 107 mg/dL — ABNORMAL HIGH (ref 70–99)
Potassium: 4.1 mEq/L (ref 3.5–5.3)
Sodium: 137 mEq/L (ref 135–145)
Total Protein: 7.6 g/dL (ref 6.0–8.3)

## 2012-11-01 LAB — LIPID PANEL: HDL: 77 mg/dL (ref >39)

## 2012-11-01 LAB — HEMOGLOBIN A1C
Hgb A1c MFr Bld: 6.3 % — ABNORMAL HIGH (ref <5.7)
Mean Plasma Glucose: 134 mg/dL — ABNORMAL HIGH (ref <117)

## 2012-11-01 MED ORDER — LOSARTAN POTASSIUM-HCTZ 50-12.5 MG PO TABS: 1.0000 | ORAL_TABLET | Freq: Every day | ORAL | Status: DC

## 2012-11-01 MED ORDER — PROPRANOLOL HCL 80 MG PO TABS: 80.0000 mg | ORAL_TABLET | Freq: Every day | ORAL | Status: DC

## 2012-11-01 MED ORDER — OMEPRAZOLE 20 MG PO CPDR: 20.0000 mg | DELAYED_RELEASE_CAPSULE | Freq: Every day | ORAL | Status: DC

## 2012-11-01 MED ORDER — IMIPRAMINE HCL 50 MG PO TABS: 100.0000 mg | ORAL_TABLET | Freq: Every day | ORAL | Status: DC

## 2012-11-01 MED ORDER — ONDANSETRON HCL 4 MG PO TABS: 4.0000 mg | ORAL_TABLET | Freq: Every day | ORAL | Status: DC | PRN

## 2012-11-01 MED ORDER — AMLODIPINE BESYLATE 10 MG PO TABS: 10.0000 mg | ORAL_TABLET | Freq: Every day | ORAL | Status: DC

## 2012-11-01 NOTE — Progress Notes (Signed)
  Subjective:    Patient ID: [name], female    DOB: [birth year], 72 y.o.   MRN: [id number]  HPI Progressive memory loss in the past year, sister in with her today out of concern, pt states she is aware of the problem, states unable to keep up with paying bills, daughter does this for the past approx 8 months, also pt no longer cooks Excessive anxiety and worry especialy in things that don't concern her, gets agitated even with herself. Feels PE has worsened her symptoms, has a lot of anger about care in the  skilled nursing facility, states "they hurt me" specifically in reference to PT, unable to get this out of her mind Cries often, feels overwhelmed, sad and depressed, helpless, not suicidal or homicidal   Review of Systems See HPI Denies recent fever or chills. Denies sinus pressure, nasal congestion, ear pain or sore throat. Denies chest congestion, productive cough or wheezing. Denies chest pains, palpitations and leg swelling Denies abdominal pain, nausea, vomiting,diarrhea or constipation.   Denies dysuria, frequency, hesitancy or incontinence. Chronic  joint pain, no  limitation in mobility. Chronic  Headaches,denies  seizures, numbness, or tingling. c/o depression and  anxiety also has  insomnia. Denies skin break down or rash.        Objective:   Physical Exam Patient alert and oriented and in no cardiopulmonary distress.  HEENT: No facial asymmetry, EOMI, no sinus tenderness,  oropharynx pink and moist.  Neck supple no adenopathy.  Chest: Clear to auscultation bilaterally.  CVS: S1, S2 no murmurs, no S3.  ABD: Soft non tender. Bowel sounds normal.  Ext: No edema  MS: Adequatethough reduced  ROM spine, shoulders, hips and knees.  Skin: Intact, no ulcerations or rash noted.  Psych: Good eye contact, tearful  affect. Memory impaired  anxious , agitated and  depressed appearing.  CNS: CN 2-12 intact, power, tone and sensation normal throughout.         Assessment & Plan:

## 2012-11-01 NOTE — Patient Instructions (Addendum)
F/u in 2 month  Labs needed fasting please lipid, cmp , hBA1C and TSH , B12, RPR and Vit D   You are being referred for psychiatric help due to severe depression  You are being referred for a brain scan due to memory loss  Medication will be started to help with memory after you have psych eval , and depending on your result s as we get them, lab and brain scan and dementia screen. The screen today is good , and a lot of the memory loss you describe is likely due to severe depression. Will discuss further after psychiatry is involved  sTOP furosemide  Stop phenergan, instead zofran , if needed, for nausea  STOP xanax  flu vaccine today  Pain medication from orthopedic Doc only and use sparingly

## 2012-11-02 ENCOUNTER — Ambulatory Visit: Payer: Self-pay | Admitting: *Deleted

## 2012-11-02 DIAGNOSIS — Z7901 Long term (current) use of anticoagulants: Secondary | ICD-10-CM

## 2012-11-02 DIAGNOSIS — I2699 Other pulmonary embolism without acute cor pulmonale: Secondary | ICD-10-CM

## 2012-11-02 LAB — TSH: TSH: 0.898 u[IU]/mL (ref 0.350–4.500)

## 2012-11-02 NOTE — Telephone Encounter (Signed)
Patient is aware 

## 2012-11-05 ENCOUNTER — Encounter: Payer: Self-pay | Admitting: Family Medicine

## 2012-11-05 ENCOUNTER — Other Ambulatory Visit: Payer: Self-pay | Admitting: Family Medicine

## 2012-11-05 DIAGNOSIS — E559 Vitamin D deficiency, unspecified: Secondary | ICD-10-CM | POA: Insufficient documentation

## 2012-11-05 NOTE — Assessment & Plan Note (Signed)
Increased symptoms with anxiety and crying episodes

## 2012-11-05 NOTE — Assessment & Plan Note (Signed)
Likely mainly due to severe depression , will obtain lab and radiologic data to assess

## 2012-11-05 NOTE — Assessment & Plan Note (Signed)
Controlled, no change in medication  

## 2012-11-05 NOTE — Assessment & Plan Note (Signed)
Controlled, no change in medication Hyperlipidemia:Low fat diet discussed and encouraged.  \ 

## 2012-11-05 NOTE — Assessment & Plan Note (Signed)
Updated lab needed to re evaluate

## 2012-11-05 NOTE — Assessment & Plan Note (Signed)
Severe and uncontrolled refer to psych for eval and asisstance with management

## 2012-11-06 ENCOUNTER — Ambulatory Visit (HOSPITAL_COMMUNITY)
Admission: RE | Admit: 2012-11-06 | Discharge: 2012-11-06 | Disposition: A | Discharge status: Home / Self Care | Payer: Medicare Other | Source: Ambulatory Visit | Attending: Family Medicine | Admitting: Family Medicine

## 2012-11-06 DIAGNOSIS — R413 Other amnesia: Secondary | ICD-10-CM | POA: Insufficient documentation

## 2012-11-06 DIAGNOSIS — R51 Headache: Secondary | ICD-10-CM | POA: Insufficient documentation

## 2012-11-09 ENCOUNTER — Other Ambulatory Visit: Payer: Self-pay | Admitting: Family Medicine

## 2012-11-09 ENCOUNTER — Other Ambulatory Visit: Payer: Self-pay

## 2012-11-09 DIAGNOSIS — R930 Abnormal findings on diagnostic imaging of skull and head, not elsewhere classified: Secondary | ICD-10-CM

## 2012-11-09 MED ORDER — ERGOCALCIFEROL 1.25 MG (50000 UT) PO CAPS: 50000.0000 [IU] | ORAL_CAPSULE | ORAL | Status: DC

## 2012-11-16 ENCOUNTER — Ambulatory Visit (INDEPENDENT_AMBULATORY_CARE_PROVIDER_SITE_OTHER): Payer: No Typology Code available for payment source | Admitting: Psychiatry

## 2012-11-16 ENCOUNTER — Telehealth: Payer: Self-pay | Admitting: Family Medicine

## 2012-11-16 ENCOUNTER — Ambulatory Visit (HOSPITAL_COMMUNITY): Payer: Medicare Other | Admitting: Psychiatry

## 2012-11-16 DIAGNOSIS — F329 Major depressive disorder, single episode, unspecified: Secondary | ICD-10-CM

## 2012-11-16 NOTE — Telephone Encounter (Signed)
Noted  

## 2012-11-17 NOTE — Patient Instructions (Signed)
Discussed orally 

## 2012-11-17 NOTE — Progress Notes (Signed)
Patient:   [name]   DOB:   May 30, 1940  MR Number:  [id number]  Location:  [address] [place], [place] [address]  Date of Service:   Thursday 11/16/2012  Start Time:   3:00 PM End Time:   3:55 PM  Provider/Observer:  Florencia Reasons, MSW, LCSW  Billing Code/Service:  [phone]  Chief Complaint:     Chief Complaint  Patient presents with  . Depression    Reason for Service:  Patient is referred by primary care physician Dr. Syliva Overman due to patient experiencing symptoms of anxiety and depression. Per patient's report symptoms began when her mother died 6 years ago and patient along with one of her sisters were not giving equal shares of the inheritance while her remaining siblings did receive equal shares. Patient states that one of her other siblings convinced that her mother to change the will.  Patient reports being hurt, disappointed and angry as she and her sister were the ones who actually provided care to their mother. Patient states that the family has been broken off part as a result. She expresses sadness as prior to mothers death, family had been very close. Patient reports constantly thinking about the way she and her sister were mistreated. She is experiencing low energy, forgetfulness and often repeats self. She states feeling down most of the time he  Current Status:  Patient reports depressed mood, anxiety, memory difficulty, loss of interest in activities, irritability, and low energy  Reliability of Information: Information gathered from patient and her husband.  Behavioral Observation: SERENNA DEROY  presents as a 72 y.o.-year-old Right African American Female who appeared her stated age. her dress was Appropriate and she was Neat and her manners were Appropriate to the situation.  Patient walked with a cane. She displayed an appropriate level of cooperation and motivation.    Interactions:    Active   Attention:   normal  Memory:   Difficulty with immediate  memory-recalled one out of three words  Visuo-spatial:     Speech (Volume):  high  Speech:   loud  Thought Process:  Coherent and Relevant  Though Content:  Rumination  Orientation:   person, place, situation, day of week, month of year and year  Judgment:   Good  Planning:   Good  Affect:    Depressed  Mood:    Angry, Anxious and Depressed  Insight:   Fair  Intelligence:   normal  Marital Status/Living: Patient was born and reared in [place]. Patient is the oldest of 10 siblings. She reports having a good childhood with a close family. Patient has been married twice. She left her first marriage due to husband's infidelity, alcoholism, and gambling. She has a 56 year old son in a 58 year old daughter from that marriage. She and her current husband have been married for 23 years and reside in [place].  Current Employment: Retired  Past Employment:  Patient reports a stable work history being employed [employer] and [employer] for several years.  Substance Use:  No concerns of substance abuse are reported.   Education:   Patient completed the 11th grade he  Medical History:   Past Medical History  Diagnosis Date  . Hyperlipidemia     takes Pravastatin daily  . Overweight(278.02)   . Depression   . Migraine   . Degenerative joint disease     Knees  . Glaucoma   . Pulmonary embolism May 2012    acute presentation, bilateral  PE  . Chronic anticoagulation 07/21/2010  . Anemia, iron deficiency 2012    Evaluated by Dr. Darrick Penna; H&H of 9.3/30.8 with MCV-79 in 10/2010; 3/3 positive Hemoccult cards in 07/2011  . Atrial fibrillation     takes Coumadin daily  . Hypertension     takes Hyzaar daily and Propranolol   . Pneumonia 1969    hx of  . Hx of migraines     last one about a yr ago;takes Topamax daily  . Joint pain   . Joint swelling   . Chronic back pain     related to knee pain  . History of gout     was on medication but taken off of several  months ago  . Bruises easily     takes Coumadin  . Skin spots-aging   . Gastroesophageal reflux disease     takes Omeprazole daily  . Hx of colonic polyps   . Urinary frequency   . Nocturia   . History of blood transfusion 1969  . Diabetes mellitus     takes Metformin daily  . Glaucoma     uses eye drops at night  . Anxiety     was on Prozac for a couple of weeks  . Insomnia     takes Restoril prn    Sexual History:   History  Sexual Activity  . Sexual Activity: Not Currently  . Birth Control/ Protection: Post-menopausal    Abuse/Trauma History: Patient reports no abuse or trauma history.  Psychiatric History:  Patient reports no psychiatric hospitalizations and no previous involvement in outpatient therapy.   Family Med/Psych History:  Family History  Problem Relation Age of Onset  . Diabetes Mother   . Hypertension Mother   . Arthritis Mother   . Heart failure Mother   . Leukemia Father   . Diabetes Sister   . Hypertension Sister   . Diabetes Brother   . Hypertension Brother   . Hypertension Brother   . Hypertension Brother   . Hypertension Brother   . Diabetes Brother   . Diabetes Brother   . Diabetes Brother   . Pulmonary embolism Sister   . Colon cancer Neg Hx   . Colon polyps Neg Hx    Patient reports brother and cannot have a history of mental problems but cannot specify diagnoses.  Risk of Suicide/Violence: Patient denies past and current suicidal and homicidal ideations. She reports no history of self-injurious behaviors, aggression, or violence  Impression/DX:  Patient presents with symptoms of anxiety and depression that began after her mother's death 6 years ago when she and one of her sisters were not given equal shares of the inheritance per instructions from the willl that patient claims another sibling influenced her mother to change. Patient reports family broke up at that time. Patient continues to ruminate about the will and the way she and  her sister were treated by their siblings. Patient is experiencing depressed mood, anxiety, memory difficulty, loss of interest in activities, irritability, and low energy. Diagnosis: Depressive disorder NOS  Disposition/Plan:  Patient attends the assessment appointment today. Confidentiality and limits are discussed. Patient agrees to return for an appointment in 2 weeks for continuing assessment and treatment planning. She also agrees to call this practice, call 911, or have someone take her to the emergency room should symptoms worsen.  Diagnosis:    Axis I:  Depressive disorder, not elsewhere classified      Axis II: Deferred  Axis III:  See medical history      Axis IV:  problems with primary support group          Axis V:  51-60 moderate symptoms

## 2012-12-01 ENCOUNTER — Ambulatory Visit (HOSPITAL_COMMUNITY): Payer: Self-pay | Admitting: Psychiatry

## 2012-12-11 ENCOUNTER — Encounter (HOSPITAL_COMMUNITY): Payer: Self-pay | Admitting: Pharmacy Technician

## 2012-12-15 ENCOUNTER — Other Ambulatory Visit: Payer: Self-pay | Admitting: *Deleted

## 2012-12-15 ENCOUNTER — Telehealth: Payer: Self-pay

## 2012-12-15 MED ORDER — DABIGATRAN ETEXILATE MESYLATE 150 MG PO CAPS: 150.0000 mg | ORAL_CAPSULE | Freq: Two times a day (BID) | ORAL | Status: DC

## 2012-12-15 NOTE — Telephone Encounter (Signed)
Patient walked in requesting prescription for Pradaxa.  Patient states she has a card for a free supply which is on file at the drug store, but they would not give her the prescription.  This should be with [id number] - [place], [place] - [address] AT [address]-[phone]  Fax [phone].

## 2012-12-15 NOTE — Telephone Encounter (Signed)
[facility]

## 2012-12-19 NOTE — Patient Instructions (Signed)
[name]  12/19/2012   Your procedure is scheduled on:  12/26/12  Report to [facility] at 0800 AM.  Call this number if you have problems the morning of surgery: [phone]   Remember:   Do not eat food or drink liquids after midnight.   Take these medicines the morning of surgery with A SIP OF WATER: norvasc, hyzaar, prilosec, zofran, inderal, topamax   Do not wear jewelry, make-up or nail polish.  Do not wear lotions, powders, or perfumes. You may wear deodorant.  Do not shave 48 hours prior to surgery. Men may shave face and neck.  Do not bring valuables to the hospital.  [facility] is not responsible                  for any belongings or valuables.               Contacts, dentures or bridgework may not be worn into surgery.  Leave suitcase in the car. After surgery it may be brought to your room.  For patients admitted to the hospital, discharge time is determined by your                treatment team.               Patients discharged the day of surgery will not be allowed to drive  home.  Name and phone number of your driver: family  Special Instructions: N/A   Please read over the following fact sheets that you were given: Pain Booklet, Anesthesia Post-op Instructions and Care and Recovery After Surgery   PATIENT INSTRUCTIONS POST-ANESTHESIA  IMMEDIATELY FOLLOWING SURGERY:  Do not drive or operate machinery for the first twenty four hours after surgery.  Do not make any important decisions for twenty four hours after surgery or while taking narcotic pain medications or sedatives.  If you develop intractable nausea and vomiting or a severe headache please notify your doctor immediately.  FOLLOW-UP:  Please make an appointment with your surgeon as instructed. You do not need to follow up with anesthesia unless specifically instructed to do so.  WOUND CARE INSTRUCTIONS (if applicable):  Keep a dry clean dressing on the anesthesia/puncture wound site if there is  drainage.  Once the wound has quit draining you may leave it open to air.  Generally you should leave the bandage intact for twenty four hours unless there is drainage.  If the epidural site drains for more than 36-48 hours please call the anesthesia department.  QUESTIONS?:  Please feel free to call your physician or the hospital operator if you have any questions, and they will be happy to assist you.      Cataract Surgery  A cataract is a clouding of the lens of the eye. When a lens becomes cloudy, vision is reduced based on the degree and nature of the clouding. Surgery may be needed to improve vision. Surgery removes the cloudy lens and usually replaces it with a substitute lens (intraocular lens, IOL). LET YOUR EYE DOCTOR KNOW ABOUT:  Allergies to food or medicine.  Medicines taken including herbs, eyedrops, over-the-counter medicines, and creams.  Use of steroids (by mouth or creams).  Previous problems with anesthetics or numbing medicine.  History of bleeding problems or blood clots.  Previous surgery.  Other health problems, including diabetes and kidney problems.  Possibility of pregnancy, if this applies. RISKS AND COMPLICATIONS  Infection.  Inflammation of the eyeball (endophthalmitis) that can spread to both  eyes (sympathetic ophthalmia).  Poor wound healing.  If an IOL is inserted, it can later fall out of proper position. This is very uncommon.  Clouding of the part of your eye that holds an IOL in place. This is called an "after-cataract." These are uncommon, but easily treated. BEFORE THE PROCEDURE  Do not eat or drink anything except small amounts of water for 8 to 12 before your surgery, or as directed by your caregiver.  Unless you are told otherwise, continue any eyedrops you have been prescribed.  Talk to your primary caregiver about all other medicines that you take (both prescription and non-prescription). In some cases, you may need to stop or  change medicines near the time of your surgery. This is most important if you are taking blood-thinning medicine.Do not stop medicines unless you are told to do so.  Arrange for someone to drive you to and from the procedure.  Do not put contact lenses in either eye on the day of your surgery. PROCEDURE There is more than one method for safely removing a cataract. Your doctor can explain the differences and help determine which is best for you. Phacoemulsification surgery is the most common form of cataract surgery.  An injection is given behind the eye or eyedrops are given to make this a painless procedure.  A small cut (incision) is made on the edge of the clear, dome-shaped surface that covers the front of the eye (cornea).  A tiny probe is painlessly inserted into the eye. This device gives off ultrasound waves that soften and break up the cloudy center of the lens. This makes it easier for the cloudy lens to be removed by suction.  An IOL may be implanted.  The normal lens of the eye is covered by a clear capsule. Part of that capsule is intentionally left in the eye to support the IOL.  Your surgeon may or may not use stitches to close the incision. There are other forms of cataract surgery that require a larger incision and stiches to close the eye. This approach is taken in cases where the doctor feels that the cataract cannot be easily removed using phacoemulsification. AFTER THE PROCEDURE  When an IOL is implanted, it does not need care. It becomes a permanent part of your eye and cannot be seen or felt.  Your doctor will schedule follow-up exams to check on your progress.  Review your other medicines with your doctor to see which can be resumed after surgery.  Use eyedrops or take medicine as prescribed by your doctor. Document Released: 01/21/2011 Document Revised: 04/26/2011 Document Reviewed: 01/21/2011 [facility] Patient Information 2014 [place], [place].

## 2012-12-20 ENCOUNTER — Ambulatory Visit (INDEPENDENT_AMBULATORY_CARE_PROVIDER_SITE_OTHER): Payer: Medicare Other | Admitting: Family Medicine

## 2012-12-20 ENCOUNTER — Encounter (INDEPENDENT_AMBULATORY_CARE_PROVIDER_SITE_OTHER): Payer: Self-pay

## 2012-12-20 ENCOUNTER — Encounter: Payer: Self-pay | Admitting: Family Medicine

## 2012-12-20 VITALS — BP 132/78 | HR 76 | Resp 18 | Ht 65.0 in | Wt 204.1 lb

## 2012-12-20 DIAGNOSIS — Z1321 Encounter for screening for nutritional disorder: Secondary | ICD-10-CM

## 2012-12-20 DIAGNOSIS — E8881 Metabolic syndrome: Secondary | ICD-10-CM

## 2012-12-20 DIAGNOSIS — E663 Overweight: Secondary | ICD-10-CM

## 2012-12-20 DIAGNOSIS — Z8639 Personal history of other endocrine, nutritional and metabolic disease: Secondary | ICD-10-CM

## 2012-12-20 DIAGNOSIS — R7309 Other abnormal glucose: Secondary | ICD-10-CM

## 2012-12-20 DIAGNOSIS — N390 Urinary tract infection, site not specified: Secondary | ICD-10-CM | POA: Insufficient documentation

## 2012-12-20 DIAGNOSIS — Z862 Personal history of diseases of the blood and blood-forming organs and certain disorders involving the immune mechanism: Secondary | ICD-10-CM

## 2012-12-20 DIAGNOSIS — Z7901 Long term (current) use of anticoagulants: Secondary | ICD-10-CM

## 2012-12-20 DIAGNOSIS — R7302 Impaired glucose tolerance (oral): Secondary | ICD-10-CM

## 2012-12-20 DIAGNOSIS — G43909 Migraine, unspecified, not intractable, without status migrainosus: Secondary | ICD-10-CM

## 2012-12-20 DIAGNOSIS — E785 Hyperlipidemia, unspecified: Secondary | ICD-10-CM

## 2012-12-20 DIAGNOSIS — Z130 Encounter for screening for diseases of the blood and blood-forming organs and certain disorders involving the immune mechanism: Secondary | ICD-10-CM

## 2012-12-20 DIAGNOSIS — I10 Essential (primary) hypertension: Secondary | ICD-10-CM

## 2012-12-20 DIAGNOSIS — F341 Dysthymic disorder: Secondary | ICD-10-CM

## 2012-12-20 DIAGNOSIS — F418 Other specified anxiety disorders: Secondary | ICD-10-CM

## 2012-12-20 LAB — POCT URINALYSIS DIPSTICK
Glucose, UA: NEGATIVE
Ketones, UA: NEGATIVE
Nitrite, UA: NEGATIVE
Protein, UA: NEGATIVE
Spec Grav, UA: 1.015
Urobilinogen, UA: 0.2
pH, UA: 6

## 2012-12-20 MED ORDER — CIPROFLOXACIN HCL 500 MG PO TABS: 500.0000 mg | ORAL_TABLET | Freq: Two times a day (BID) | ORAL | Status: AC

## 2012-12-20 MED ORDER — BUTALBITAL-APAP-CAFFEINE 50-325-40 MG PO TABS: ORAL_TABLET | ORAL | Status: DC

## 2012-12-20 MED ORDER — CIPROFLOXACIN HCL 500 MG PO TABS: ORAL_TABLET | ORAL | Status: DC

## 2012-12-20 NOTE — Patient Instructions (Addendum)
Pelvic and breast exam in 4 month, call if you need me before   You are being treated for a presumed urinary uinfection, we will call if necessary when culture report comes back. [facility]  You will get a script for ciprofloxacin, an antibiotic, to use after intercourse, to redcue risk of getting infection. Ensure you drink a lot of water and empty bladder often.  Fioricet is the medication for headaches.  memory is nearly perfect, no need for medication for this   Fasting lipid, cmp and EGFr, hBA1c and vit D in 4 month, before next visit

## 2012-12-20 NOTE — Progress Notes (Signed)
  Subjective:    Patient ID: [name], female    DOB: 08/03/1940, 72 y.o.   MRN: [id number]  HPI 2 week h/o increased urinary frequency with dysuria and pressure. Denies fever or chills or flank pain. Symptoms are aggravated by intercourse. C/o increased headache frequency, needs medication to stop pain, also very concerned that she has memory loss. Dis go to therapist since last visit as she was very depressed and recalling very unpleasant events related to her SNF stay. This has improved/resolved, she states however that she stopped going since she wanted medication to help her memory. MMSE today however is repeated at a 26. States her daughter has to be helping with bill payment , will re address at next visit , as may benefit from medication, will question family members furhter Recently had a change in blood thinner due to s/e , tolerating current better, but cost is an issue, she will discuss with prescriber   Review of Systems See HPI Denies recent fever or chills. Denies sinus pressure, nasal congestion, ear pain or sore throat. Denies chest congestion, productive cough or wheezing. Denies chest pains, palpitations and leg swelling Denies abdominal pain, nausea, vomiting,diarrhea or constipation.    Chronic back pain Denies  seizures, numbness, or tingling. Denies depression, anxiety or insomnia. Denies skin break down or rash.        Objective:   Physical Exam  Patient alert and oriented and in no cardiopulmonary distress.  HEENT: No facial asymmetry, EOMI, no sinus tenderness,  oropharynx pink and moist.  Neck supple no adenopathy.  Chest: Clear to auscultation bilaterally.  CVS: S1, S2 no murmurs, no S3.  ABD: Soft non tender. Bowel sounds normal.No renal angle or supr pubic tenderness  Ext: No edema  WU:[id number]  ROM spine,adeqaute in  shoulders, hips and knees.  Skin: Intact, no ulcerations or rash noted.  Psych: Good eye contact, normal affect. Memory  intact mildly  anxious not  depressed appearing.  CNS: CN 2-12 intact, no focal deficits on gross exam       Assessment & Plan:

## 2012-12-22 ENCOUNTER — Encounter (HOSPITAL_COMMUNITY)
Admission: RE | Admit: 2012-12-22 | Discharge: 2012-12-22 | Disposition: A | Discharge status: Home / Self Care | Payer: Medicare Other | Source: Ambulatory Visit | Attending: Ophthalmology | Admitting: Ophthalmology

## 2012-12-22 ENCOUNTER — Encounter (HOSPITAL_COMMUNITY): Payer: Self-pay

## 2012-12-22 DIAGNOSIS — E8881 Metabolic syndrome: Secondary | ICD-10-CM | POA: Insufficient documentation

## 2012-12-22 DIAGNOSIS — Z01812 Encounter for preprocedural laboratory examination: Secondary | ICD-10-CM | POA: Insufficient documentation

## 2012-12-22 DIAGNOSIS — Z7901 Long term (current) use of anticoagulants: Secondary | ICD-10-CM | POA: Insufficient documentation

## 2012-12-22 LAB — HEMOGLOBIN AND HEMATOCRIT, BLOOD
HCT: 36 % (ref 36.0–46.0)
Hemoglobin: 11.6 g/dL — ABNORMAL LOW (ref 12.0–15.0)

## 2012-12-22 LAB — BASIC METABOLIC PANEL
BUN: 20 mg/dL (ref 6–23)
Calcium: 9.2 mg/dL (ref 8.4–10.5)
Creatinine, Ser: 1.24 mg/dL — ABNORMAL HIGH (ref 0.50–1.10)
GFR calc Af Amer: 49 mL/min — ABNORMAL LOW (ref >90)
GFR calc non Af Amer: 42 mL/min — ABNORMAL LOW (ref >90)
Glucose, Bld: 142 mg/dL — ABNORMAL HIGH (ref 70–99)
Potassium: 3.6 mEq/L (ref 3.5–5.1)

## 2012-12-22 NOTE — Assessment & Plan Note (Signed)
Uncontrolled resume fioricet , as needed

## 2012-12-22 NOTE — Assessment & Plan Note (Signed)
Increased CV risk, pt encouraged to work on weight loss and dietary change to reduce this

## 2012-12-22 NOTE — Assessment & Plan Note (Signed)
Patient educated about the importance of limiting  Carbohydrate intake , the need to commit to daily physical activity for a minimum of 30 minutes , and to commit weight loss. The fact that changes in all these areas will reduce or eliminate all together the development of diabetes is stressed.   Updated lab next visit 

## 2012-12-22 NOTE — Assessment & Plan Note (Signed)
Unchanged. Patient re-educated about  the importance of commitment to a  minimum of 150 minutes of exercise per week. The importance of healthy food choices with portion control discussed. Encouraged to start a food diary, count calories and to consider  joining a support group. Sample diet sheets offered. Goals set by the patient for the next several months.    

## 2012-12-22 NOTE — Assessment & Plan Note (Signed)
Controlled, no change in medication DASH diet and commitment to daily physical activity for a minimum of 30 minutes discussed and encouraged, as a part of hypertension management. The importance of attaining a healthy weight is also discussed.  

## 2012-12-22 NOTE — Assessment & Plan Note (Signed)
Symptomatic with abnormal UA , treat presumptively. Start prophylactic medication one tablet to be used after intercourse

## 2012-12-22 NOTE — Assessment & Plan Note (Signed)
improved

## 2012-12-24 ENCOUNTER — Other Ambulatory Visit: Payer: Self-pay | Admitting: Family Medicine

## 2012-12-24 MED ORDER — NITROFURANTOIN MACROCRYSTAL 100 MG PO CAPS: 100.0000 mg | ORAL_CAPSULE | Freq: Two times a day (BID) | ORAL | Status: AC

## 2012-12-25 MED ORDER — KETOROLAC TROMETHAMINE 0.5 % OP SOLN
OPHTHALMIC | Status: AC
  Filled 2012-12-25: qty 5

## 2012-12-25 MED ORDER — TETRACAINE HCL 0.5 % OP SOLN
OPHTHALMIC | Status: AC
  Filled 2012-12-25: qty 2

## 2012-12-25 MED ORDER — CYCLOPENTOLATE-PHENYLEPHRINE OP SOLN OPTIME - NO CHARGE
OPHTHALMIC | Status: AC
  Filled 2012-12-25: qty 2

## 2012-12-26 ENCOUNTER — Encounter (HOSPITAL_COMMUNITY): Payer: Self-pay

## 2012-12-26 ENCOUNTER — Encounter (HOSPITAL_COMMUNITY): Payer: Medicare Other | Admitting: Anesthesiology

## 2012-12-26 ENCOUNTER — Encounter (HOSPITAL_COMMUNITY)
Admission: RE | Disposition: A | Discharge status: Home / Self Care | Payer: Self-pay | Source: Ambulatory Visit | Attending: Ophthalmology

## 2012-12-26 ENCOUNTER — Ambulatory Visit (HOSPITAL_COMMUNITY): Payer: Medicare Other | Admitting: Anesthesiology

## 2012-12-26 ENCOUNTER — Ambulatory Visit (HOSPITAL_COMMUNITY)
Admission: RE | Admit: 2012-12-26 | Discharge: 2012-12-26 | Disposition: A | Discharge status: Home / Self Care | Payer: Medicare Other | Source: Ambulatory Visit | Attending: Ophthalmology | Admitting: Ophthalmology

## 2012-12-26 DIAGNOSIS — Z01812 Encounter for preprocedural laboratory examination: Secondary | ICD-10-CM | POA: Insufficient documentation

## 2012-12-26 DIAGNOSIS — E119 Type 2 diabetes mellitus without complications: Secondary | ICD-10-CM | POA: Insufficient documentation

## 2012-12-26 DIAGNOSIS — H2510 Age-related nuclear cataract, unspecified eye: Secondary | ICD-10-CM | POA: Insufficient documentation

## 2012-12-26 DIAGNOSIS — I10 Essential (primary) hypertension: Secondary | ICD-10-CM | POA: Insufficient documentation

## 2012-12-26 HISTORY — PX: CATARACT EXTRACTION W/PHACO: SHX586

## 2012-12-26 SURGERY — PHACOEMULSIFICATION, CATARACT, WITH IOL INSERTION
Anesthesia: Monitor Anesthesia Care | Site: Eye | Laterality: Left | Wound class: Clean

## 2012-12-26 MED ORDER — BSS IO SOLN
INTRAOCULAR | Status: DC | PRN
  Administered 2012-12-26: 15 mL via INTRAOCULAR

## 2012-12-26 MED ORDER — FENTANYL CITRATE 0.05 MG/ML IJ SOLN: 25.0000 ug | INTRAMUSCULAR | Status: DC | PRN

## 2012-12-26 MED ORDER — KETOROLAC TROMETHAMINE 0.5 % OP SOLN
1.0000 [drp] | OPHTHALMIC | Status: AC
  Administered 2012-12-26 (×3): 1 [drp] via OPHTHALMIC

## 2012-12-26 MED ORDER — EPINEPHRINE HCL 1 MG/ML IJ SOLN
INTRAMUSCULAR | Status: AC
  Filled 2012-12-26: qty 1

## 2012-12-26 MED ORDER — MIDAZOLAM HCL 2 MG/2ML IJ SOLN
INTRAMUSCULAR | Status: AC
  Filled 2012-12-26: qty 2

## 2012-12-26 MED ORDER — EPINEPHRINE HCL 1 MG/ML IJ SOLN
INTRAOCULAR | Status: DC | PRN
  Administered 2012-12-26: 09:00:00

## 2012-12-26 MED ORDER — LACTATED RINGERS IV SOLN
INTRAVENOUS | Status: DC
  Administered 2012-12-26: 1000 mL via INTRAVENOUS

## 2012-12-26 MED ORDER — TETRACAINE HCL 0.5 % OP SOLN
1.0000 [drp] | OPHTHALMIC | Status: AC
  Administered 2012-12-26 (×3): 1 [drp] via OPHTHALMIC

## 2012-12-26 MED ORDER — PHENYLEPHRINE HCL 2.5 % OP SOLN
1.0000 [drp] | OPHTHALMIC | Status: AC
  Administered 2012-12-26 (×3): 1 [drp] via OPHTHALMIC

## 2012-12-26 MED ORDER — MIDAZOLAM HCL 2 MG/2ML IJ SOLN
1.0000 mg | INTRAMUSCULAR | Status: DC | PRN
  Administered 2012-12-26: 2 mg via INTRAVENOUS

## 2012-12-26 MED ORDER — PHENYLEPHRINE HCL 2.5 % OP SOLN
OPHTHALMIC | Status: AC
  Filled 2012-12-26: qty 15

## 2012-12-26 MED ORDER — CYCLOPENTOLATE-PHENYLEPHRINE 0.2-1 % OP SOLN
1.0000 [drp] | OPHTHALMIC | Status: AC
  Administered 2012-12-26 (×3): 1 [drp] via OPHTHALMIC

## 2012-12-26 MED ORDER — PROVISC 10 MG/ML IO SOLN
INTRAOCULAR | Status: DC | PRN
  Administered 2012-12-26: 0.85 mL via INTRAOCULAR

## 2012-12-26 MED ORDER — ONDANSETRON HCL 4 MG/2ML IJ SOLN: 4.0000 mg | Freq: Once | INTRAMUSCULAR | Status: DC | PRN

## 2012-12-26 SURGICAL SUPPLY — 23 items
CAPSULAR TENSION RING-AMO (OPHTHALMIC RELATED) IMPLANT
CLOTH BEACON ORANGE TIMEOUT ST (SAFETY) ×2 IMPLANT
EYE SHIELD UNIVERSAL CLEAR (GAUZE/BANDAGES/DRESSINGS) ×2 IMPLANT
GLOVE BIO SURGEON STRL SZ 6.5 (GLOVE) ×2 IMPLANT
GLOVE ECLIPSE 6.5 STRL STRAW (GLOVE) IMPLANT
GLOVE ECLIPSE 7.0 STRL STRAW (GLOVE) IMPLANT
GLOVE EXAM NITRILE LRG STRL (GLOVE) IMPLANT
GLOVE EXAM NITRILE MD LF STRL (GLOVE) ×2 IMPLANT
GLOVE SKINSENSE NS SZ6.5 (GLOVE)
GLOVE SKINSENSE STRL SZ6.5 (GLOVE) IMPLANT
HEALON 5 0.6 ML (INTRAOCULAR LENS) IMPLANT
KIT VITRECTOMY (OPHTHALMIC RELATED) IMPLANT
PAD ARMBOARD 7.5X6 YLW CONV (MISCELLANEOUS) ×2 IMPLANT
PROC W NO LENS (INTRAOCULAR LENS)
PROC W SPEC LENS (INTRAOCULAR LENS)
PROCESS W NO LENS (INTRAOCULAR LENS) IMPLANT
PROCESS W SPEC LENS (INTRAOCULAR LENS) IMPLANT
RING MALYGIN (MISCELLANEOUS) IMPLANT
SIGHTPATH CAT PROC W REG LENS (Ophthalmic Related) ×2 IMPLANT
TAPE SURG TRANSPORE 1 IN (GAUZE/BANDAGES/DRESSINGS) ×1 IMPLANT
TAPE SURGICAL TRANSPORE 1 IN (GAUZE/BANDAGES/DRESSINGS) ×1
VISCOELASTIC ADDITIONAL (OPHTHALMIC RELATED) IMPLANT
WATER STERILE IRR 250ML POUR (IV SOLUTION) ×2 IMPLANT

## 2012-12-26 NOTE — Anesthesia Postprocedure Evaluation (Signed)
  Anesthesia Post-op Note  Patient: [name]  Procedure(s) Performed: Procedure(s) with comments: CATARACT EXTRACTION PHACO AND INTRAOCULAR LENS PLACEMENT (IOC) (Left) - CDE:7.74  Patient Location: Short Stay  Anesthesia Type:MAC  Level of Consciousness: awake, alert  and oriented  Airway and Oxygen Therapy: Patient Spontanous Breathing  Post-op Pain: none  Post-op Assessment: Post-op Vital signs reviewed, Patient's Cardiovascular Status Stable, Respiratory Function Stable, Patent Airway and No signs of Nausea or vomiting  Post-op Vital Signs: Reviewed and stable  Complications: No apparent anesthesia complications

## 2012-12-26 NOTE — Op Note (Signed)
Patient brought to the operating room and prepped and draped in the usual manner.  Lid speculum inserted in left eye.  Stab incision made at the twelve o'clock position.  Provisc instilled in the anterior chamber.   A 2.4 mm. Stab incision was made temporally.  An anterior capsulotomy was done with a bent 25 gauge needle.  The nucleus was hydrodissected.  The Phaco tip was inserted in the anterior chamber and the nucleus was emulsified.  CDE was 7.74.  The cortical material was then removed with the I and A tip.  Posterior capsule was the polished.  The anterior chamber was deepened with Provisc.  A 19.5 Diopter Rayner 570C IOL was then inserted in the capsular bag.  Provisc was then removed with the I and A tip.  The wound was then hydrated.  Patient sent to the Recovery Room in good condition with follow up in my office.  Preoperative Diagnosis:  Nuclear Cataract OS Postoperative Diagnosis:  Same Procedure name: Kelman Phacoemulsification OS with IOL

## 2012-12-26 NOTE — Anesthesia Preprocedure Evaluation (Signed)
Anesthesia Evaluation  Patient identified by MRN, date of birth, ID band Patient awake    Reviewed: Allergy & Precautions, H&P , NPO status , Patient's Chart, lab work & pertinent test results  Airway Mallampati: II      Dental   Pulmonary pneumonia -, resolved,  breath sounds clear to auscultation        Cardiovascular hypertension, Pt. on medications Rhythm:Regular Rate:Normal     Neuro/Psych  Headaches, Anxiety Depression    GI/Hepatic Neg liver ROS, GERD-  Controlled,  Endo/Other  diabetes, Type 2  Renal/GU negative Renal ROS     Musculoskeletal   Abdominal   Peds  Hematology negative hematology ROS (+)   Anesthesia Other Findings   Reproductive/Obstetrics                           Anesthesia Physical Anesthesia Plan  ASA: III  Anesthesia Plan: MAC   Post-op Pain Management:    Induction: Intravenous  Airway Management Planned: Nasal Cannula  Additional Equipment:   Intra-op Plan:   Post-operative Plan:   Informed Consent: I have reviewed the patients History and Physical, chart, labs and discussed the procedure including the risks, benefits and alternatives for the proposed anesthesia with the patient or authorized representative who has indicated his/her understanding and acceptance.     Plan Discussed with:   Anesthesia Plan Comments:         Anesthesia Quick Evaluation

## 2012-12-26 NOTE — H&P (Signed)
The patient was re examined and there is no change in the patients condition since the original H and P. 

## 2012-12-26 NOTE — Transfer of Care (Signed)
Immediate Anesthesia Transfer of Care Note  Patient: [name]  Procedure(s) Performed: Procedure(s) with comments: CATARACT EXTRACTION PHACO AND INTRAOCULAR LENS PLACEMENT (IOC) (Left) - CDE:7.74  Patient Location: Short Stay  Anesthesia Type:MAC  Level of Consciousness: awake  Airway & Oxygen Therapy: Patient Spontanous Breathing  Post-op Assessment: Report given to PACU RN  Post vital signs: Reviewed  Complications: No apparent anesthesia complications

## 2012-12-27 ENCOUNTER — Encounter (HOSPITAL_COMMUNITY): Payer: Self-pay | Admitting: Ophthalmology

## 2012-12-29 ENCOUNTER — Telehealth: Payer: Self-pay

## 2012-12-29 ENCOUNTER — Telehealth: Payer: Self-pay | Admitting: Cardiology

## 2012-12-29 NOTE — Telephone Encounter (Signed)
Please call patient regarding Pradaxa. / tgs

## 2012-12-29 NOTE — Telephone Encounter (Signed)
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 If she has real concerns about this she can be referred on, but advise her that many people have numbness and few have MS

## 2012-12-29 NOTE — Telephone Encounter (Signed)
Pt requested samples, placed up front for pt pick up, pt understood

## 2012-12-29 NOTE — Telephone Encounter (Signed)
Returned pt call,

## 2013-01-02 ENCOUNTER — Ambulatory Visit: Payer: Medicare Other | Admitting: Family Medicine

## 2013-01-05 NOTE — Telephone Encounter (Signed)
Patient states that she will wait at this time on testing.  She will contact office is she develops new symptoms.

## 2013-01-15 ENCOUNTER — Other Ambulatory Visit: Payer: Self-pay

## 2013-01-15 MED ORDER — PRAVASTATIN SODIUM 80 MG PO TABS: 80.0000 mg | ORAL_TABLET | Freq: Every evening | ORAL | Status: DC

## 2013-01-26 ENCOUNTER — Other Ambulatory Visit: Payer: Self-pay | Admitting: Family Medicine

## 2013-01-31 ENCOUNTER — Ambulatory Visit (INDEPENDENT_AMBULATORY_CARE_PROVIDER_SITE_OTHER): Payer: Medicare Other | Admitting: Cardiology

## 2013-01-31 ENCOUNTER — Encounter: Payer: Self-pay | Admitting: Cardiology

## 2013-01-31 VITALS — BP 173/75 | HR 60 | Ht 67.0 in | Wt 209.0 lb

## 2013-01-31 DIAGNOSIS — I10 Essential (primary) hypertension: Secondary | ICD-10-CM

## 2013-01-31 DIAGNOSIS — I2699 Other pulmonary embolism without acute cor pulmonale: Secondary | ICD-10-CM

## 2013-01-31 NOTE — Progress Notes (Signed)
Clinical Summary [name] is a 72 y.o.female former patient of Dr Dietrich Pates, this is our first visit together. She was seen for the following problems.   1. History of PE - occurred in 06/2010, per notes appeared to be unprovoked. Has been on chronic anticoagulation since. Denies any problems with bleeding.  - on chronic anticoagulation - INRs were labile on coumadin, switched to xarelto.  - reports xarelto caused diffuse numbness, changed to pradaxa which she is tolerating well  2. HTN - does not check regularly - compliant with meds  Past Medical History  Diagnosis Date  . Hyperlipidemia     takes Pravastatin daily  . Overweight(278.02)   . Depression   . Migraine   . Degenerative joint disease     Knees  . Glaucoma   . Pulmonary embolism May 2012    acute presentation, bilateral PE  . Chronic anticoagulation 07/21/2010  . Anemia, iron deficiency 2012    Evaluated by Dr. Darrick Penna; H&H of 9.3/30.8 with MCV-79 in 10/2010; 3/3 positive Hemoccult cards in 07/2011  . Atrial fibrillation     takes Coumadin daily  . Hypertension     takes Hyzaar daily and Propranolol   . Pneumonia 1969    hx of  . Hx of migraines     last one about a yr ago;takes Topamax daily  . Joint pain   . Joint swelling   . Chronic back pain     related to knee pain  . History of gout     was on medication but taken off of several months ago  . Bruises easily     takes Coumadin  . Skin spots-aging   . Gastroesophageal reflux disease     takes Omeprazole daily  . Hx of colonic polyps   . Urinary frequency   . Nocturia   . History of blood transfusion 1969  . Diabetes mellitus     takes Metformin daily  . Glaucoma     uses eye drops at night  . Anxiety     was on Prozac for a couple of weeks  . Insomnia     takes Restoril prn     Allergies  Allergen Reactions  . Fentanyl Itching    Patient just started the patch on 08-08-2011 and she has noted itching, but states that it is helping.  .  Sulfonamide Derivatives Hives  . Codeine Itching and Rash     Current Outpatient Prescriptions  Medication Sig Dispense Refill  . amLODipine (NORVASC) 10 MG tablet Take 1 tablet (10 mg total) by mouth daily.  90 tablet  1  . butalbital-acetaminophen-caffeine (FIORICET) 50-325-40 MG per tablet One  To two tablets , every 12 hours , as needed, for uncontrolled headache  40 tablet  0  . ciprofloxacin (CIPRO) 500 MG tablet One tablet once after intercourse, as needed,  .  8 tablet  0  . dabigatran (PRADAXA) 150 MG CAPS capsule Take 1 capsule (150 mg total) by mouth every 12 (twelve) hours.  60 capsule  6  . ergocalciferol (VITAMIN D2) 50000 UNITS capsule Take 1 capsule (50,000 Units total) by mouth once a week. One capsule once weekly  12 capsule  2  . Ferrous Sulfate (IRON) 325 (65 FE) MG TABS Take 1 tablet by mouth 2 (two) times daily.       Marland Kitchen HYDROcodone-acetaminophen (NORCO/VICODIN) 5-325 MG per tablet Take 1 tablet by mouth 2 (two) times daily as needed for pain.      Marland Kitchen  imipramine (TOFRANIL) 50 MG tablet Take 2 tablets (100 mg total) by mouth at bedtime.  180 tablet  1  . losartan-hydrochlorothiazide (HYZAAR) 50-12.5 MG per tablet Take 1 tablet by mouth daily.  90 tablet  1  . meclizine (ANTIVERT) 25 MG tablet TAKE 1 TABLET 3 TIMES A DAY AS NEEDED  30 tablet  1  . omeprazole (PRILOSEC) 20 MG capsule Take 1 capsule (20 mg total) by mouth daily.  90 capsule  1  . ondansetron (ZOFRAN) 4 MG tablet Take 1 tablet (4 mg total) by mouth daily as needed for nausea.  30 tablet  1  . pravastatin (PRAVACHOL) 80 MG tablet Take 1 tablet (80 mg total) by mouth every evening.  90 tablet  1  . propranolol (INDERAL) 80 MG tablet Take 1 tablet (80 mg total) by mouth daily.  90 tablet  1  . topiramate (TOPAMAX) 100 MG tablet Take 1 tablet (100 mg total) by mouth 2 (two) times daily.  30 tablet  0   No current facility-administered medications for this visit.   Facility-Administered Medications Ordered in  Other Visits  Medication Dose Route Frequency Provider Last Rate Last Dose  . fentaNYL (SUBLIMAZE) injection 25-50 mcg  25-50 mcg Intravenous Q5 min PRN Laurene Footman, MD         Past Surgical History  Procedure Laterality Date  . Tonsillectomy    . Dilation and curettage of uterus    . Urethral dilation    . Knee arthroscopy w/ meniscectomy  90's    Left  . Colonoscopy  Jan 2002; 2012    2002: Dr. Katrinka Blazing, ext. hemorrhoids, nl colon; 2012-adenomatous polyps, gastritis on EGD  . Upper gastrointestinal endoscopy    . Orif ankle fracture Right 90's  . Givens capsule study  08/18/2011    Procedure: GIVENS CAPSULE STUDY;  Surgeon: Malissa Hippo, MD;  Location: [facility]ITE;  Service: Endoscopy;  Laterality: N/A;  730  . Esophagogastroduodenoscopy  08/24/2011    ESOPHAGOGASTRODUODENOSCOPY; esophageal dilatation; Malissa Hippo, MD;  . 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 Cataract extraction w/phaco Right 04/11/2012    Procedure: CATARACT EXTRACTION PHACO AND INTRAOCULAR LENS PLACEMENT (IOC);  Surgeon: Loraine Leriche T. Nile Riggs, MD;  Location: [facility] ORS;  Service: Ophthalmology;  Laterality: Right;  CDE=9.61  . Cataract extraction w/phaco Left 12/26/2012    Procedure: CATARACT EXTRACTION PHACO AND INTRAOCULAR LENS PLACEMENT (IOC);  Surgeon: Loraine Leriche T. Nile Riggs, MD;  Location: [facility] OR[facility];  Service: Ophthalmology;  Laterality: Left;  CDE:7.74     Allergies  Allergen Reactions  . Fentanyl Itching    Patient just started the patch on 08-08-2011 and she has noted itching, but states that it is helping.  . Sulfonamide Derivatives Hives  . Codeine Itching and Rash      Family History  Problem Relation Age of Onset  . Diabetes Mother   . Hypertension Mother   . Arthritis Mother   . Heart failure Mother   . Leukemia Father   . Diabetes Sister   . Hypertension Sister   . Diabetes  Brother   . Hypertension Brother   . Hypertension Brother   . Hypertension Brother   . Hypertension Brother   . Diabetes Brother   . Diabetes Brother   . Diabetes Brother   . Pulmonary embolism Sister   . Colon cancer Neg Hx   .  Colon polyps Neg Hx      Social History Ms. Borgmeyer reports that she has never smoked. She has never used smokeless tobacco. Ms. Harmon reports that she does not drink alcohol.   Review of Systems CONSTITUTIONAL: No weight loss, fever, chills, weakness or fatigue.  HEENT: Eyes: No visual loss, blurred vision, double vision or yellow sclerae.No hearing loss, sneezing, congestion, runny nose or sore throat.  SKIN: No rash or itching.  CARDIOVASCULAR: no chest pain, no palpitations, no orthopnea RESPIRATORY: No shortness of breath, cough or sputum.  GASTROINTESTINAL: No anorexia, nausea, vomiting or diarrhea. No abdominal pain or blood.  GENITOURINARY: No burning on urination, no polyuria NEUROLOGICAL: No headache, dizziness, syncope, paralysis, ataxia, numbness or tingling in the extremities. No change in bowel or bladder control.  MUSCULOSKELETAL: No muscle, back pain, joint pain or stiffness.  LYMPHATICS: No enlarged nodes. No history of splenectomy.  PSYCHIATRIC: No history of depression or anxiety.  ENDOCRINOLOGIC: No reports of sweating, cold or heat intolerance. No polyuria or polydipsia.  Marland Kitchen   Physical Examination p 60 bp 140/90 Wt 209 lbs BMI 33 Gen: resting comfortably, no acute distress HEENT: no scleral icterus, pupils equal round and reactive, no palptable cervical adenopathy,  CV: RRR, no m/r/g, no JVD, on carotid bruits Resp: Clear to auscultation bilaterally GI: abdomen is soft, non-tender, non-distended, normal bowel sounds, no hepatosplenomegaly MSK: extremities are warm, no edema.  Skin: warm, no rash Neuro:  no focal deficits Psych: appropriate affect    Assessment and Plan  1. 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 HTN - at goal, continue current medications      Antoine Poche, M.D., F.A.C.C.

## 2013-01-31 NOTE — Patient Instructions (Signed)
Your physician wants you to follow-up in: ONE YEAR You will receive a reminder letter in the mail two months in advance. If you don't receive a letter, please call our office to schedule the follow-up appointment.  

## 2013-02-06 ENCOUNTER — Other Ambulatory Visit: Payer: Self-pay | Admitting: Family Medicine

## 2013-03-15 ENCOUNTER — Telehealth: Payer: Self-pay | Admitting: *Deleted

## 2013-03-15 NOTE — Telephone Encounter (Signed)
Pt needs pradaxa samples. 

## 2013-03-16 NOTE — Telephone Encounter (Signed)
See note below

## 2013-03-16 NOTE — Telephone Encounter (Signed)
Noted this nurse in [place] office today, please provide samples for pt

## 2013-03-19 ENCOUNTER — Ambulatory Visit: Payer: Medicare HMO | Admitting: Family Medicine

## 2013-03-31 ENCOUNTER — Telehealth: Payer: Self-pay | Admitting: Family Medicine

## 2013-03-31 ENCOUNTER — Other Ambulatory Visit: Payer: Self-pay | Admitting: Family Medicine

## 2013-03-31 MED ORDER — SUMATRIPTAN SUCCINATE 100 MG PO TABS: ORAL_TABLET | ORAL | Status: DC

## 2013-03-31 NOTE — Telephone Encounter (Signed)
Requested refill on imitrex due to increased  Headache frequency, refill x 2 sent to [facility]

## 2013-04-11 ENCOUNTER — Ambulatory Visit (INDEPENDENT_AMBULATORY_CARE_PROVIDER_SITE_OTHER): Payer: Medicare HMO | Admitting: Family Medicine

## 2013-04-11 ENCOUNTER — Emergency Department (HOSPITAL_COMMUNITY)
Admission: EM | Admit: 2013-04-11 | Discharge: 2013-04-11 | Disposition: A | Discharge status: Home / Self Care | Payer: Medicare HMO

## 2013-04-11 ENCOUNTER — Encounter (INDEPENDENT_AMBULATORY_CARE_PROVIDER_SITE_OTHER): Payer: Self-pay

## 2013-04-11 ENCOUNTER — Encounter (HOSPITAL_COMMUNITY): Payer: Self-pay | Admitting: Emergency Medicine

## 2013-04-11 ENCOUNTER — Ambulatory Visit (HOSPITAL_COMMUNITY)
Admission: RE | Admit: 2013-04-11 | Discharge: 2013-04-11 | Disposition: A | Discharge status: Home / Self Care | Payer: Medicare HMO | Source: Ambulatory Visit | Attending: Family Medicine | Admitting: Family Medicine

## 2013-04-11 ENCOUNTER — Encounter: Payer: Self-pay | Admitting: Family Medicine

## 2013-04-11 VITALS — BP 152/84 | HR 80 | Temp 98.2°F | Resp 18 | Wt 212.1 lb

## 2013-04-11 DIAGNOSIS — Z8639 Personal history of other endocrine, nutritional and metabolic disease: Secondary | ICD-10-CM

## 2013-04-11 DIAGNOSIS — J302 Other seasonal allergic rhinitis: Secondary | ICD-10-CM | POA: Insufficient documentation

## 2013-04-11 DIAGNOSIS — I10 Essential (primary) hypertension: Secondary | ICD-10-CM

## 2013-04-11 DIAGNOSIS — N3000 Acute cystitis without hematuria: Secondary | ICD-10-CM

## 2013-04-11 DIAGNOSIS — R32 Unspecified urinary incontinence: Secondary | ICD-10-CM

## 2013-04-11 DIAGNOSIS — E785 Hyperlipidemia, unspecified: Secondary | ICD-10-CM

## 2013-04-11 DIAGNOSIS — R05 Cough: Secondary | ICD-10-CM | POA: Insufficient documentation

## 2013-04-11 DIAGNOSIS — N390 Urinary tract infection, site not specified: Secondary | ICD-10-CM

## 2013-04-11 DIAGNOSIS — J309 Allergic rhinitis, unspecified: Secondary | ICD-10-CM

## 2013-04-11 DIAGNOSIS — R7309 Other abnormal glucose: Secondary | ICD-10-CM

## 2013-04-11 DIAGNOSIS — R059 Cough, unspecified: Secondary | ICD-10-CM | POA: Insufficient documentation

## 2013-04-11 DIAGNOSIS — R079 Chest pain, unspecified: Secondary | ICD-10-CM | POA: Insufficient documentation

## 2013-04-11 DIAGNOSIS — Z862 Personal history of diseases of the blood and blood-forming organs and certain disorders involving the immune mechanism: Secondary | ICD-10-CM

## 2013-04-11 DIAGNOSIS — M62838 Other muscle spasm: Secondary | ICD-10-CM | POA: Insufficient documentation

## 2013-04-11 DIAGNOSIS — G43909 Migraine, unspecified, not intractable, without status migrainosus: Secondary | ICD-10-CM

## 2013-04-11 DIAGNOSIS — D509 Iron deficiency anemia, unspecified: Secondary | ICD-10-CM

## 2013-04-11 DIAGNOSIS — R7302 Impaired glucose tolerance (oral): Secondary | ICD-10-CM

## 2013-04-11 LAB — POCT URINALYSIS DIPSTICK
BILIRUBIN UA: NEGATIVE
Glucose, UA: NEGATIVE
Ketones, UA: NEGATIVE
LEUKOCYTES UA: NEGATIVE
NITRITE UA: NEGATIVE
PH UA: 7
PROTEIN UA: NEGATIVE
RBC UA: NEGATIVE
Spec Grav, UA: 1.015
UROBILINOGEN UA: 0.2

## 2013-04-11 MED ORDER — FLUTICASONE PROPIONATE 50 MCG/ACT NA SUSP: 2.0000 | Freq: Every day | NASAL | Status: DC

## 2013-04-11 MED ORDER — METHOCARBAMOL 500 MG PO TABS: ORAL_TABLET | ORAL | Status: DC

## 2013-04-11 NOTE — ED Notes (Signed)
Coughing for 2 weeks ago.  C/o chest pain with cough for one weeks.  Went to see dr. Moshe Cipro this morning and she told her it is a pulled muscle and sent her to the er.

## 2013-04-11 NOTE — Assessment & Plan Note (Signed)
Increased with chronic cough primarily when supine, start daily flonase

## 2013-04-11 NOTE — Assessment & Plan Note (Signed)
EKG in office shows NSR, no acute ischemia, pt also has reproducible chest wall pai at CC junctions 3 and 4 on left side. Tylenol 325 mg one twice daily for chest wall pain

## 2013-04-11 NOTE — Progress Notes (Signed)
   Subjective:    Patient ID: [name], female    DOB: 06-17-1940, 73 y.o.   MRN: [id number]  HPI 2 week h/o dry hacking cough, also clear runny nose.Denies fever or chills Left side of neck is stiff with reduce d mobility Reproducible left chest wall pain, aggravated by cough, denies hemoptysis, denies PND, palpitations , leg swelling or orthopnea   Review of Systems See HPI Denies recent fever or chills. Denies sinus pressure, nasal congestion, ear pain or sore throat. Denies chest congestion,c/o non  productive cough denies  wheezing. Denies chest pains, palpitations and leg swelling Denies abdominal pain, nausea, vomiting,diarrhea or constipation.   C/o dysuria , frequency and poor urinary streamChronic headaches, but responds to maxalt, denies seizures, numbness, or tingling. Denies depression, anxiety or insomnia. Denies skin break down or rash.        Objective:   Physical Exam BP 152/84  Pulse 80  Temp(Src) 98.2 F (36.8 C)  Resp 18  Wt 212 lb 1.9 oz (96.217 kg)  SpO2 98% Patient alert and oriented and in no cardiopulmonary distress.  HEENT: No facial asymmetry, EOMI, no sinus tenderness,  oropharynx pink and moist.  Neck supple no adenopathy.  Chest: Clear to auscultation bilaterally.Reproducible pain over 3rd and 4th CC junctions  CVS: S1, S2 no murmurs, no S3.  ABD: Soft non tender. Bowel sounds normal.No epigastric, suprapubic or flank tenderness  Ext: No edema  MS: Adequate ROM spine, shoulders, hips and knees.  Skin: Intact, no ulcerations or rash noted.  Mildly  anxious not depressed appearing.  CNS: CN 2-12 intact, power, tone and sensation normal throughout.        Assessment & Plan:  UTI (urinary tract infection) Urinalysis is negative, sh e reports poor stream also , will refer for urology eval  Cough Dry cough x 2 weeks, no fever or chills, worse in supine position, increased nasal drainage noted, likely due to allergies, CXR and  start flonase daily  Seasonal allergies Increased with chronic cough primarily when supine, start daily flonase  MIGRAINE HEADACHE Continue daily topamax, immitrex as needed for severe headache max of 10 per month  Neck muscle spasm Left trapezius spasm, robaxin at bedtime for 5 to 7 days , then as needed  HYPERLIPIDEMIA Hyperlipidemia:Low fat diet discussed and encouraged.  Updated lab needed at/ before next visit.   Chest pain, unspecified EKG in office shows NSR, no acute ischemia, pt also has reproducible chest wall pai at CC junctions 3 and 4 on left side. Tylenol 325 mg one twice daily for chest wall pain  HYPERTENSION Elevated bloodpressure at this visit, and has been consistently elevated in the last several visits, increase losartan dose

## 2013-04-11 NOTE — Assessment & Plan Note (Signed)
Urinalysis is negative, sh e reports poor stream also , will refer for urology eval

## 2013-04-11 NOTE — Assessment & Plan Note (Signed)
Dry cough x 2 weeks, no fever or chills, worse in supine position, increased nasal drainage noted, likely due to allergies, CXR and start flonase daily

## 2013-04-11 NOTE — Assessment & Plan Note (Signed)
Hyperlipidemia:Low fat diet discussed and encouraged.  Updated lab needed at/ before next visit.  

## 2013-04-11 NOTE — Assessment & Plan Note (Signed)
Continue daily topamax, immitrex as needed for severe headache max of 10 per month

## 2013-04-11 NOTE — Patient Instructions (Addendum)
F/u in 4 weeks, call if you need me before  No sign of urine infection, you are refered to bladder specialist for further evaluation  CXR today for chronic cough  New nasal spray daily for allergies dripping down the back of your nose causing bedtime cough.  EKG is normal, use tylenol 325 mg one twice daily for 5 days for chest wall pain,and  muscle relaxant , robaxin is prescribed at night for left neck spasm  Fasting lipid, cmp and EGFr, HBA1C, CBc and iron 2 to 3 days 2 to 5 days before follow up

## 2013-04-11 NOTE — Assessment & Plan Note (Signed)
Left trapezius spasm, robaxin at bedtime for 5 to 7 days , then as needed

## 2013-04-13 ENCOUNTER — Telehealth: Payer: Self-pay | Admitting: *Deleted

## 2013-04-13 ENCOUNTER — Other Ambulatory Visit: Payer: Self-pay

## 2013-04-13 ENCOUNTER — Telehealth: Payer: Self-pay | Admitting: Family Medicine

## 2013-04-13 DIAGNOSIS — I10 Essential (primary) hypertension: Secondary | ICD-10-CM

## 2013-04-13 MED ORDER — TEMAZEPAM 30 MG PO CAPS: 30.0000 mg | ORAL_CAPSULE | Freq: Every day | ORAL | Status: DC

## 2013-04-13 MED ORDER — PRAVASTATIN SODIUM 80 MG PO TABS: 80.0000 mg | ORAL_TABLET | Freq: Every evening | ORAL | Status: DC

## 2013-04-13 MED ORDER — IMIPRAMINE HCL 50 MG PO TABS: 100.0000 mg | ORAL_TABLET | Freq: Every day | ORAL | Status: DC

## 2013-04-13 MED ORDER — TOPIRAMATE 100 MG PO TABS: 100.0000 mg | ORAL_TABLET | Freq: Two times a day (BID) | ORAL | Status: DC

## 2013-04-13 MED ORDER — AMLODIPINE BESYLATE 10 MG PO TABS: 10.0000 mg | ORAL_TABLET | Freq: Every day | ORAL | Status: DC

## 2013-04-13 MED ORDER — LOSARTAN POTASSIUM-HCTZ 100-12.5 MG PO TABS: 1.0000 | ORAL_TABLET | Freq: Every day | ORAL | Status: DC

## 2013-04-13 MED ORDER — PROPRANOLOL HCL 80 MG PO TABS: 80.0000 mg | ORAL_TABLET | Freq: Every day | ORAL | Status: DC

## 2013-04-13 NOTE — Assessment & Plan Note (Addendum)
Elevated bloodpressure at this visit, and has been consistently elevated in the last several visits, increase losartan dose

## 2013-04-13 NOTE — Telephone Encounter (Signed)
   Patient aware and has decided that she does not want to see urologist.    She wants to know if Omeprazole can be changed to another med.  She feels that chest discomfort is coming from GERD

## 2013-04-13 NOTE — Telephone Encounter (Signed)
See what else is preferred on her formularly if nothing then try dexilant 30mg  daily, we will use failed omeprazole as the reason, but you will need to see what is [ptreferred first

## 2013-04-13 NOTE — Telephone Encounter (Signed)
Pls call pt let her know CXR showed clear lungs  Let her know BP has been high, change in bP med entered 30 day supply with refiills only, needs to STOP lower dose of cozaar once she starts this , no other changes. Pls fax in new script which is entered  Ask if she wants to be referred to urologist since she reports poor urinary stream  Tell her I hope she is feeling better, ensure she has the 4 week f/u that was recommended  Thanks!

## 2013-04-13 NOTE — Telephone Encounter (Signed)
Placed 6 boxes of pradaxa 150mg  at the front desk for pt to pick up, pt made aware

## 2013-04-13 NOTE — Telephone Encounter (Signed)
Pt needs samples of pradaza 150 mg

## 2013-04-16 MED ORDER — ESOMEPRAZOLE MAGNESIUM 40 MG PO PACK: 40.0000 mg | PACK | Freq: Every day | ORAL | Status: DC

## 2013-04-16 NOTE — Addendum Note (Signed)
Addended by: Denman George B on: 04/16/2013 09:21 AM   Modules accepted: Orders, Medications

## 2013-04-16 NOTE — Telephone Encounter (Signed)
Patient requests that GERD med be changed to Nexium.  Per patient preference med changed to Nexium 40mg  po daily.  Med is tier 3 on formulary.   Patient aware.

## 2013-04-19 ENCOUNTER — Ambulatory Visit (INDEPENDENT_AMBULATORY_CARE_PROVIDER_SITE_OTHER): Payer: Medicare HMO | Admitting: Family Medicine

## 2013-04-19 ENCOUNTER — Encounter: Payer: Self-pay | Admitting: Family Medicine

## 2013-04-19 VITALS — BP 130/84 | HR 78 | Resp 18 | Ht 65.0 in | Wt 215.1 lb

## 2013-04-19 DIAGNOSIS — Z1211 Encounter for screening for malignant neoplasm of colon: Secondary | ICD-10-CM

## 2013-04-19 DIAGNOSIS — Z1212 Encounter for screening for malignant neoplasm of rectum: Secondary | ICD-10-CM

## 2013-04-19 DIAGNOSIS — Z0000 Encounter for general adult medical examination without abnormal findings: Secondary | ICD-10-CM

## 2013-04-19 LAB — HEMOCCULT GUIAC POC 1CARD (OFFICE): Fecal Occult Blood, POC: NEGATIVE

## 2013-04-19 NOTE — Patient Instructions (Signed)
F/u in 3.5 month, call if you need me before  HBA1C, cmp and EGFR and fasting lipid as soon as possible  No changes in medication

## 2013-04-20 ENCOUNTER — Other Ambulatory Visit (HOSPITAL_COMMUNITY)
Admission: RE | Admit: 2013-04-20 | Discharge: 2013-04-20 | Disposition: A | Discharge status: Home / Self Care | Payer: Medicare HMO | Source: Ambulatory Visit | Attending: Family Medicine | Admitting: Family Medicine

## 2013-04-20 ENCOUNTER — Other Ambulatory Visit: Payer: Self-pay | Admitting: Family Medicine

## 2013-04-20 DIAGNOSIS — Z124 Encounter for screening for malignant neoplasm of cervix: Secondary | ICD-10-CM | POA: Insufficient documentation

## 2013-04-20 LAB — COMPLETE METABOLIC PANEL WITH GFR
ALK PHOS: 101 U/L (ref 39–117)
ALT: 15 U/L (ref 0–35)
AST: 13 U/L (ref 0–37)
Albumin: 4.2 g/dL (ref 3.5–5.2)
BUN: 17 mg/dL (ref 6–23)
CALCIUM: 10 mg/dL (ref 8.4–10.5)
CO2: 27 mEq/L (ref 19–32)
CREATININE: 1.13 mg/dL — AB (ref 0.50–1.10)
Chloride: 100 mEq/L (ref 96–112)
GFR, Est African American: 56 mL/min — ABNORMAL LOW
GFR, Est Non African American: 48 mL/min — ABNORMAL LOW
Glucose, Bld: 120 mg/dL — ABNORMAL HIGH (ref 70–99)
Potassium: 3.7 mEq/L (ref 3.5–5.3)
Sodium: 137 mEq/L (ref 135–145)
Total Bilirubin: 0.3 mg/dL (ref 0.2–1.2)
Total Protein: 7.4 g/dL (ref 6.0–8.3)

## 2013-04-20 LAB — CBC
HEMATOCRIT: 40.5 % (ref 36.0–46.0)
Hemoglobin: 13.1 g/dL (ref 12.0–15.0)
MCH: 27.5 pg (ref 26.0–34.0)
MCHC: 32.3 g/dL (ref 30.0–36.0)
MCV: 84.9 fL (ref 78.0–100.0)
PLATELETS: 318 10*3/uL (ref 150–400)
RBC: 4.77 MIL/uL (ref 3.87–5.11)
RDW: 15.9 % — AB (ref 11.5–15.5)
WBC: 7 10*3/uL (ref 4.0–10.5)

## 2013-04-20 LAB — LIPID PANEL
CHOLESTEROL: 164 mg/dL (ref 0–200)
HDL: 68 mg/dL (ref >39)
LDL Cholesterol: 70 mg/dL (ref 0–99)
TRIGLYCERIDES: 128 mg/dL (ref <150)
Total CHOL/HDL Ratio: 2.4 Ratio
VLDL: 26 mg/dL (ref 0–40)

## 2013-04-20 LAB — HEMOGLOBIN A1C
Hgb A1c MFr Bld: 6.7 % — ABNORMAL HIGH (ref <5.7)
Mean Plasma Glucose: 146 mg/dL — ABNORMAL HIGH (ref <117)

## 2013-04-20 LAB — IRON: Iron: 68 ug/dL (ref 42–145)

## 2013-04-26 ENCOUNTER — Other Ambulatory Visit: Payer: Self-pay | Admitting: Family Medicine

## 2013-04-26 ENCOUNTER — Telehealth: Payer: Self-pay | Admitting: Family Medicine

## 2013-04-26 NOTE — Telephone Encounter (Signed)
Pt was just on 53 robaxin for muscle spasm, call and see if she feels the need for muscle relaxants any longer, explain they all heave a sedative s/e and the least number of meds she is on the better for her. If feels she needs med, please explain not covered, and will need to use baclofen 10mg  one at night , send as needed only 30 tabs let her know the hope is that she will not use this every night , thanks

## 2013-04-27 ENCOUNTER — Other Ambulatory Visit: Payer: Self-pay

## 2013-04-27 MED ORDER — SUMATRIPTAN SUCCINATE 100 MG PO TABS: ORAL_TABLET | ORAL | Status: DC

## 2013-04-27 NOTE — Telephone Encounter (Signed)
I sent in the imitrex for 9 tabs instead of 10 and [facility] and [facility]. :) Unable to reach [place] so [place] will relay the message.

## 2013-04-27 NOTE — Telephone Encounter (Signed)
pls see what medication is covered for migraine and will decide based on list (not sure but I think the quantitiy of the immitrex was the prob, need to f/u pls

## 2013-04-27 NOTE — Telephone Encounter (Signed)
pls see response 

## 2013-04-27 NOTE — Telephone Encounter (Signed)
Isn't worried about the robaxin anymore. She is needing something for her migraines when she has them. Her imtrex wasn't covered and tylenol isn't helping her pain. Only needs something for as needed use. Please advise

## 2013-04-30 NOTE — Telephone Encounter (Signed)
Noted, thanks!

## 2013-05-09 ENCOUNTER — Other Ambulatory Visit: Payer: Self-pay

## 2013-05-09 ENCOUNTER — Encounter: Payer: Self-pay | Admitting: Family Medicine

## 2013-05-09 DIAGNOSIS — I10 Essential (primary) hypertension: Secondary | ICD-10-CM

## 2013-05-09 MED ORDER — LOSARTAN POTASSIUM-HCTZ 100-12.5 MG PO TABS: 1.0000 | ORAL_TABLET | Freq: Every day | ORAL | Status: DC

## 2013-05-10 ENCOUNTER — Telehealth: Payer: Self-pay

## 2013-05-10 MED ORDER — BUSPIRONE HCL 5 MG PO TABS: 5.0000 mg | ORAL_TABLET | Freq: Two times a day (BID) | ORAL | Status: DC

## 2013-05-10 MED ORDER — ALPRAZOLAM 0.25 MG PO TABS: ORAL_TABLET | ORAL | Status: DC

## 2013-05-10 NOTE — Telephone Encounter (Signed)
I will print pls send and let her know. Remind her only as needed, and short term and limited amt , since  Xanax associaited with increased fall risk and possible memory loss. I recommend buspar daily for chronic anxiety if she is willing to take that pls s erx buspar 5mg  one twice daily #60 refill 3, this is a safer medication for daily use

## 2013-05-10 NOTE — Telephone Encounter (Signed)
Patient aware and will start buspar.  buspar sent to mail order pharmacy ([facility])

## 2013-05-20 DIAGNOSIS — Z0000 Encounter for general adult medical examination without abnormal findings: Secondary | ICD-10-CM | POA: Insufficient documentation

## 2013-05-20 NOTE — Assessment & Plan Note (Signed)
Annual exam as documented. Counseling done  re healthy lifestyle involving commitment to 150 minutes exercise per week, heart healthy diet, and attaining healthy weight.The importance of adequate sleep also discussed. Regular seat belt use and safe storage  of firearms if patient has them, is also discussed. Changes in health habits are decided on by the patient with goals and time frames  set for achieving them. Immunization and cancer screening needs are specifically addressed at this visit.  

## 2013-05-20 NOTE — Progress Notes (Signed)
   Subjective:    Patient ID: [name], female    DOB: [birth year], 73 y.o.   MRN: [id number]  HPI Patient is in for annual exam Health maintainance is reviewed and updated, specifically screening tests and recommended immunizations.      Review of Systems See HPI Denies recent fever or chills. Denies sinus pressure, nasal congestion, ear pain or sore throat. Denies chest congestion, productive cough or wheezing. Denies chest pains, palpitations and leg swelling Denies abdominal pain, nausea, vomiting,diarrhea or constipation.   Denies dysuria, frequency, hesitancy or incontinence. C/o chronic  joint pain,  and limitation in mobility. C/o intermittent  headaches Denies uncontrolled  Depression,does have both  Anxiety with mild  insomnia. Denies skin break down or rash.        Objective:   Physical Exam  Pleasant well nourished female, alert and oriented x 3, in no cardio-pulmonary distress. Afebrile. HEENT No facial trauma or asymetry. Sinuses non tender.  EOMI, PERTL, fundoscopic exam is normal, no hemorhage or exudate.  External ears normal, tympanic membranes clear. Oropharynx moist, no exudate, good dentition. Neck: supple, no adenopathy,JVD or thyromegaly.No bruits.  Chest: Clear to ascultation bilaterally.No crackles or wheezes. Non tender to palpation  Breast: No asymetry,no masses. No nipple discharge or inversion. No axillary or supraclavicular adenopathy  Cardiovascular system; Heart sounds normal,  S1 and  S2 ,no S3.  No murmur, or thrill. Apical beat not displaced Peripheral pulses normal.  Abdomen: Soft, non tender, no organomegaly or masses. No bruits. Bowel sounds normal. No guarding, tenderness or rebound.  Rectal:  No mass. Guaiac negative stool.  GU: External genitalia normal. No lesions. Vaginal canal normal.white  discharge. Uterus normal size, no adnexal masses, no cervical motion or adnexal tenderness.  Musculoskeletal  exam: Decreased ROM of spine, hips , shoulders and knees.mild  deformity ,and  crepitus noted in knees Mild  muscle wasting  In quads, no atrophy.   Neurologic: Cranial nerves 2 to 12 intact. Power, tone ,sensation and reflexes normal throughout. . No tremor.  Skin: Intact, no ulceration, erythema , scaling or rash noted. Pigmentation normal throughout  Psych; Normal mood and affect. Judgement and concentration normal       Assessment & Plan:  Routine general medical examination at a health care facility Annual exam as documented. Counseling done  re healthy lifestyle involving commitment to 150 minutes exercise per week, heart healthy diet, and attaining healthy weight.The importance of adequate sleep also discussed. Regular seat belt use and safe storage  of firearms if patient has them, is also discussed. Changes in health habits are decided on by the patient with goals and time frames  set for achieving them. Immunization and cancer screening needs are specifically addressed at this visit.

## 2013-06-20 ENCOUNTER — Telehealth: Payer: Self-pay | Admitting: *Deleted

## 2013-06-20 NOTE — Telephone Encounter (Signed)
Called pt and made her aware to pick up samples at front desk.

## 2013-06-20 NOTE — Telephone Encounter (Signed)
Pt would like pradaxa samples [phone]

## 2013-06-24 ENCOUNTER — Emergency Department (HOSPITAL_COMMUNITY): Payer: Medicare HMO

## 2013-06-24 ENCOUNTER — Encounter (HOSPITAL_COMMUNITY): Payer: Self-pay | Admitting: Emergency Medicine

## 2013-06-24 ENCOUNTER — Emergency Department (HOSPITAL_COMMUNITY)
Admission: EM | Admit: 2013-06-24 | Discharge: 2013-06-24 | Disposition: A | Discharge status: Home / Self Care | Payer: Medicare HMO | Attending: Emergency Medicine | Admitting: Emergency Medicine

## 2013-06-24 DIAGNOSIS — E119 Type 2 diabetes mellitus without complications: Secondary | ICD-10-CM | POA: Insufficient documentation

## 2013-06-24 DIAGNOSIS — Z7901 Long term (current) use of anticoagulants: Secondary | ICD-10-CM | POA: Insufficient documentation

## 2013-06-24 DIAGNOSIS — F3289 Other specified depressive episodes: Secondary | ICD-10-CM | POA: Insufficient documentation

## 2013-06-24 DIAGNOSIS — G4700 Insomnia, unspecified: Secondary | ICD-10-CM | POA: Insufficient documentation

## 2013-06-24 DIAGNOSIS — Y929 Unspecified place or not applicable: Secondary | ICD-10-CM | POA: Insufficient documentation

## 2013-06-24 DIAGNOSIS — Z860100 Personal history of colon polyps, unspecified: Secondary | ICD-10-CM | POA: Insufficient documentation

## 2013-06-24 DIAGNOSIS — S3992XA Unspecified injury of lower back, initial encounter: Secondary | ICD-10-CM

## 2013-06-24 DIAGNOSIS — Z8739 Personal history of other diseases of the musculoskeletal system and connective tissue: Secondary | ICD-10-CM | POA: Insufficient documentation

## 2013-06-24 DIAGNOSIS — I10 Essential (primary) hypertension: Secondary | ICD-10-CM | POA: Insufficient documentation

## 2013-06-24 DIAGNOSIS — Z862 Personal history of diseases of the blood and blood-forming organs and certain disorders involving the immune mechanism: Secondary | ICD-10-CM | POA: Insufficient documentation

## 2013-06-24 DIAGNOSIS — G609 Hereditary and idiopathic neuropathy, unspecified: Secondary | ICD-10-CM | POA: Insufficient documentation

## 2013-06-24 DIAGNOSIS — Y9389 Activity, other specified: Secondary | ICD-10-CM | POA: Insufficient documentation

## 2013-06-24 DIAGNOSIS — G40909 Epilepsy, unspecified, not intractable, without status epilepticus: Secondary | ICD-10-CM | POA: Insufficient documentation

## 2013-06-24 DIAGNOSIS — K219 Gastro-esophageal reflux disease without esophagitis: Secondary | ICD-10-CM | POA: Insufficient documentation

## 2013-06-24 DIAGNOSIS — IMO0002 Reserved for concepts with insufficient information to code with codable children: Secondary | ICD-10-CM | POA: Insufficient documentation

## 2013-06-24 DIAGNOSIS — Z86711 Personal history of pulmonary embolism: Secondary | ICD-10-CM | POA: Insufficient documentation

## 2013-06-24 DIAGNOSIS — E785 Hyperlipidemia, unspecified: Secondary | ICD-10-CM | POA: Insufficient documentation

## 2013-06-24 DIAGNOSIS — Z79899 Other long term (current) drug therapy: Secondary | ICD-10-CM | POA: Insufficient documentation

## 2013-06-24 DIAGNOSIS — Z8701 Personal history of pneumonia (recurrent): Secondary | ICD-10-CM | POA: Insufficient documentation

## 2013-06-24 DIAGNOSIS — F329 Major depressive disorder, single episode, unspecified: Secondary | ICD-10-CM | POA: Insufficient documentation

## 2013-06-24 DIAGNOSIS — I4891 Unspecified atrial fibrillation: Secondary | ICD-10-CM | POA: Insufficient documentation

## 2013-06-24 DIAGNOSIS — F411 Generalized anxiety disorder: Secondary | ICD-10-CM | POA: Insufficient documentation

## 2013-06-24 DIAGNOSIS — K5900 Constipation, unspecified: Secondary | ICD-10-CM

## 2013-06-24 DIAGNOSIS — Z7902 Long term (current) use of antithrombotics/antiplatelets: Secondary | ICD-10-CM | POA: Insufficient documentation

## 2013-06-24 DIAGNOSIS — W07XXXA Fall from chair, initial encounter: Secondary | ICD-10-CM | POA: Insufficient documentation

## 2013-06-24 MED ORDER — MAGNESIUM CITRATE PO SOLN: 1.0000 | Freq: Once | ORAL | Status: DC

## 2013-06-24 MED ORDER — SENNOSIDES-DOCUSATE SODIUM 8.6-50 MG PO TABS: 2.0000 | ORAL_TABLET | Freq: Every day | ORAL | Status: DC

## 2013-06-24 MED ORDER — BELLADONNA-OPIUM 16.2-30 MG RE SUPP: 30.0000 mg | Freq: Two times a day (BID) | RECTAL | Status: DC | PRN

## 2013-06-24 NOTE — ED Notes (Signed)
Pt went to bathroom but unable to have bm. Very loose consistency noted to toilet paper. No blood noted at this time.EDP aware.

## 2013-06-24 NOTE — ED Provider Notes (Signed)
CSN: [id number]     Arrival date & time 06/24/13  1143 History   First MD Initiated Contact with Patient 06/24/13 1316     Chief Complaint  Patient presents with  . Constipation     HPI  Patient presents with concern constipation, bloody stool. Patient had a mechanical fall 3 days ago, landing on a hard surface with her coccyx. Since that time she has had normal stool production, and some blood with straining. No abdominal pain, lightheadedness, syncope, fever, chills, loss of sensation or incontinence. Minimal relief with anything, and no bowel movements with enema.   Past Medical History  Diagnosis Date  . Hyperlipidemia     takes Pravastatin daily  . Overweight   . Depression   . Migraine   . Degenerative joint disease     Knees  . Glaucoma   . Pulmonary embolism May 2012    acute presentation, bilateral PE  . Chronic anticoagulation 07/21/2010  . Anemia, iron deficiency 2012    Evaluated by Dr. Oneida Alar; H&H of 9.3/30.8 with Herreid in 10/2010; 3/3 positive Hemoccult cards in 07/2011  . Atrial fibrillation     takes Coumadin daily  . Hypertension     takes Hyzaar daily and Propranolol   . Pneumonia 1969    hx of  . Hx of migraines     last one about a yr ago;takes Topamax daily  . Joint pain   . Joint swelling   . Chronic back pain     related to knee pain  . History of gout     was on medication but taken off of several months ago  . Bruises easily     takes Coumadin  . Skin spots-aging   . Gastroesophageal reflux disease     takes Omeprazole daily  . Hx of colonic polyps   . Urinary frequency   . Nocturia   . History of blood transfusion 1969  . Diabetes mellitus     takes Metformin daily  . Glaucoma     uses eye drops at night  . Anxiety     was on Prozac for a couple of weeks  . Insomnia     takes Restoril prn   Past Surgical History  Procedure Laterality Date  . Tonsillectomy    . Dilation and curettage of uterus    . Urethral dilation    . Knee  arthroscopy w/ meniscectomy  90's    Left  . Colonoscopy  Jan 2002; 2012    2002: Dr. Tamala Julian, ext. hemorrhoids, nl colon; 2012-adenomatous polyps, gastritis on EGD  . Upper gastrointestinal endoscopy    . Orif ankle fracture Right 90's  . Givens capsule study  08/18/2011    Procedure: GIVENS CAPSULE STUDY;  Surgeon: Rogene Houston, MD;  Location: [facility];  Service: Endoscopy;  Laterality: N/A;  730  . Esophagogastroduodenoscopy  08/24/2011    ESOPHAGOGASTRODUODENOSCOPY; esophageal dilatation; Rogene Houston, MD;  . Total knee arthroplasty  10/20/2011    Procedure: TOTAL KNEE ARTHROPLASTY;  Surgeon: Ninetta Lights, MD;  Location: [facility];  Service: Orthopedics;  Laterality: Left;  . Cataract extraction w/phaco Right 04/11/2012    Procedure: CATARACT EXTRACTION PHACO AND INTRAOCULAR LENS PLACEMENT (IOC);  Surgeon: Elta Guadeloupe T. Gershon Crane, MD;  Location: [facility];  Service: Ophthalmology;  Laterality: Right;  CDE=9.61  . Cataract extraction w/phaco Left 12/26/2012    Procedure: CATARACT EXTRACTION PHACO AND INTRAOCULAR LENS PLACEMENT (IOC);  Surgeon: Elta Guadeloupe T. Gershon Crane, MD;  Location:  [facility];  Service: Ophthalmology;  Laterality: Left;  CDE:7.74   Family History  Problem Relation Age of Onset  . Diabetes Mother   . Hypertension Mother   . Arthritis Mother   . Heart failure Mother   . Leukemia Father   . Diabetes Sister   . Hypertension Sister   . Diabetes Brother   . Hypertension Brother   . Hypertension Brother   . Hypertension Brother   . Hypertension Brother   . Diabetes Brother   . Diabetes Brother   . Diabetes Brother   . Pulmonary embolism Sister   . Colon cancer Neg Hx   . Colon polyps Neg Hx    History  Substance Use Topics  . Smoking status: Never Smoker   . Smokeless tobacco: Never Used  . Alcohol Use: No   OB History   Grav Para Term Preterm Abortions TAB SAB Ect Mult Living                 Review of Systems  All other systems reviewed and are negative.     Allergies    Fentanyl; Sulfonamide derivatives; and Codeine  Home Medications   Prior to Admission medications   Medication Sig Start Date End Date Taking? Authorizing Provider  dabigatran (PRADAXA) 150 MG CAPS capsule Take 1 capsule (150 mg total) by mouth every 12 (twelve) hours. 12/15/12  Yes Evans Lance, MD  esomeprazole (NEXIUM) 40 MG capsule Take 40 mg by mouth daily before breakfast.   Yes Historical Provider, MD  imipramine (TOFRANIL) 50 MG tablet Take 2 tablets (100 mg total) by mouth at bedtime. 04/13/13  Yes [name], MD  losartan-hydrochlorothiazide (HYZAAR) 100-12.5 MG per tablet Take 1 tablet by mouth daily. 05/09/13  Yes [name], MD  meclizine (ANTIVERT) 25 MG tablet Take 25 mg by mouth 3 (three) times daily as needed for dizziness.   Yes Historical Provider, MD  pravastatin (PRAVACHOL) 80 MG tablet Take 1 tablet (80 mg total) by mouth every evening. 04/13/13  Yes [name], MD  propranolol (INDERAL) 80 MG tablet Take 1 tablet (80 mg total) by mouth daily. 04/13/13  Yes [name], MD  SUMAtriptan (IMITREX) 100 MG tablet One tablet at headache onset, may repeat one time only in a 24 hour period, after 2 hours, if the headache persists. 04/27/13  Yes [name], MD  temazepam (RESTORIL) 30 MG capsule Take 1 capsule (30 mg total) by mouth at bedtime. 04/13/13  Yes [name], MD  topiramate (TOPAMAX) 100 MG tablet Take 1 tablet (100 mg total) by mouth 2 (two) times daily. 04/13/13  Yes [name], MD  belladonna-opium (B&O SUPPRETTES) 16.2-30 MG suppository Place 1 suppository rectally 2 (two) times daily as needed for pain. 06/24/13   Carmin Muskrat, MD  magnesium citrate SOLN Take 296 mLs (1 Bottle total) by mouth once. 06/24/13   Carmin Muskrat, MD  senna-docusate (SENOKOT-S) 8.6-50 MG per tablet Take 2 tablets by mouth daily. 06/24/13   Carmin Muskrat, MD   BP 131/73  Pulse 70  Temp(Src) 98 F (36.7 C) (Oral)  Resp 20  Ht 5\' 7"   (1.702 m)  Wt 215 lb (97.523 kg)  BMI 33.67 kg/m2  SpO2 97% Physical Exam  Nursing note and vitals reviewed. Constitutional: She is oriented to person, place, and time. She appears well-developed and well-nourished. No distress.  HENT:  Head: Normocephalic and atraumatic.  Eyes: Conjunctivae and EOM are normal.  Cardiovascular: Normal  rate and regular rhythm.   Pulmonary/Chest: Effort normal and breath sounds normal. No stridor. No respiratory distress.  Abdominal: She exhibits no distension.  Genitourinary:     Musculoskeletal: She exhibits no edema.  Neurological: She is alert and oriented to person, place, and time. No cranial nerve deficit. She exhibits normal muscle tone. Coordination normal.  Skin: Skin is warm and dry.  Psychiatric: She has a normal mood and affect.    ED Course  Procedures (including critical care time)  After the initial evaluation the patient had a attempts at bowel movement.  She produced liquidy brown stool.   Imaging Review Dg Sacrum/coccyx  06/24/2013   CLINICAL DATA:  CONSTIPATION  EXAM: SACRUM AND COCCYX - 2+ VIEW  COMPARISON:  None.  FINDINGS: There is no evidence of fracture or other focal bone lesions. Degenerative changes within the sacroiliac joints, pubic symphysis, and lower lumbar spine. Grade 1 anterolisthesis L4-5.  IMPRESSION: No acute osseous abnormalities. Grade 1 anterolisthesis L4-5. Degenerative changes within the sacroiliac joints and lower lumbar spine and the pubic symphysis.   Electronically Signed   By: Margaree Mackintosh M.D.   On: 06/24/2013 14:01     On repeat exam after reviewing x-ray, and the patient's attempts to have a bowel movement she was discharged with a medication course, return precautions, primary care followup MDM   Final diagnoses:  Constipation  Injury of sacrum    Patient presents from his after injuring her coccyx with ongoing constipation.  Patient is hemodynamically stable, in no distress, but with her  difficulty with bowel movements she was started on a bowel regimen, including suppositories for pain control    Carmin Muskrat, MD 06/24/13 1506

## 2013-06-24 NOTE — Discharge Instructions (Signed)
As discussed, it is normal to have some pain with defecation when you have a bruise of your sacrum.  However, if you develop new, or concerning changes in your condition, please be sure to return here for further evaluation and management.

## 2013-06-24 NOTE — ED Notes (Signed)
Pt states that she was in a rolling chair and fell landing on her buttock area this past Thursday, has not been able to have a bowel movement since then, states "I can feel that it is at my buttock but it won't come out and I am having bright red rectal bleeding this am", also complains of lower back pain,

## 2013-06-28 ENCOUNTER — Other Ambulatory Visit: Payer: Self-pay

## 2013-06-28 MED ORDER — TEMAZEPAM 30 MG PO CAPS: 30.0000 mg | ORAL_CAPSULE | Freq: Every day | ORAL | Status: DC

## 2013-07-02 ENCOUNTER — Ambulatory Visit (INDEPENDENT_AMBULATORY_CARE_PROVIDER_SITE_OTHER): Payer: Medicare HMO | Admitting: Family Medicine

## 2013-07-02 ENCOUNTER — Encounter: Payer: Self-pay | Admitting: Family Medicine

## 2013-07-02 VITALS — BP 146/84 | HR 74 | Resp 18 | Ht 65.0 in | Wt 220.0 lb

## 2013-07-02 DIAGNOSIS — Z9181 History of falling: Secondary | ICD-10-CM

## 2013-07-02 DIAGNOSIS — M5441 Lumbago with sciatica, right side: Secondary | ICD-10-CM | POA: Insufficient documentation

## 2013-07-02 DIAGNOSIS — I150 Renovascular hypertension: Secondary | ICD-10-CM

## 2013-07-02 DIAGNOSIS — M5440 Lumbago with sciatica, unspecified side: Secondary | ICD-10-CM

## 2013-07-02 DIAGNOSIS — F418 Other specified anxiety disorders: Secondary | ICD-10-CM

## 2013-07-02 DIAGNOSIS — M5430 Sciatica, unspecified side: Secondary | ICD-10-CM

## 2013-07-02 DIAGNOSIS — E119 Type 2 diabetes mellitus without complications: Secondary | ICD-10-CM

## 2013-07-02 DIAGNOSIS — F341 Dysthymic disorder: Secondary | ICD-10-CM

## 2013-07-02 MED ORDER — HYDROCODONE-ACETAMINOPHEN 5-325 MG PO TABS: 1.0000 | ORAL_TABLET | Freq: Every evening | ORAL | Status: DC | PRN

## 2013-07-02 MED ORDER — FLUOXETINE HCL (PMDD) 10 MG PO TABS: 1.0000 | ORAL_TABLET | Freq: Every day | ORAL | Status: DC

## 2013-07-02 MED ORDER — METFORMIN HCL ER 500 MG PO TB24: 500.0000 mg | ORAL_TABLET | Freq: Every day | ORAL | Status: DC

## 2013-07-02 MED ORDER — FLUOXETINE HCL 10 MG PO TABS: 10.0000 mg | ORAL_TABLET | Freq: Every day | ORAL | Status: DC

## 2013-07-02 NOTE — Patient Instructions (Signed)
F/u in 6 to 8 weeks , call if you need me before  New for depression is fluoxetine , you HAVE taken this in the past, I believe you will do very well with this  New for low back pain for short period hydrocodone one at bedtime as needed #20 only  New for blood sugar going up to 6.7 , which will help with weight loss also is long acting metforin 500mg  one daily

## 2013-07-02 NOTE — Progress Notes (Signed)
   Subjective:    Patient ID: [name], female    DOB: October 18, 1940, 73 y.o.   MRN: [id number]  HPI  Slipped and fell off computer chair at home on 5/10, chair has wheels and thinks she hit low back on wheel, seen in ed , med prescribed was not affordable, continued 10 plus pain , disturbing sleep, pain radiates to lower extremities, no associated numbness or weakness or incontinence however  Review of Systems See HPI Denies recent fever or chills. Denies sinus pressure, nasal congestion, ear pain or sore throat. Denies chest congestion, productive cough or wheezing. Denies chest pains, palpitations and leg swelling Denies abdominal pain, nausea, vomiting,diarrhea or constipation.   Denies dysuria, frequency, hesitancy or incontinence C/o increased  depression, anxiety and  Insomnia.Aggravated by recent fall and uncontrolled pain, but an ongoing problem, she is not suicidal or homicidal Denies skin break down or rash.        Objective:   Physical Exam BP 146/84  Pulse 74  Resp 18  Ht 5\' 5"  (1.651 m)  Wt 220 lb 0.6 oz (99.809 kg)  BMI 36.62 kg/m2  SpO2 96% Patient alert and oriented and in no cardiopulmonary distress.Pt in pain  HEENT: No facial asymmetry, EOMI,   oropharynx pink and moist.  Neck supple no JVD, no mass.  Chest: Clear to auscultation bilaterally.  CVS: S1, S2 no murmurs, no S3.Regular rate.  ABD: Soft non tender.   Ext: No edema  MS: decreased ROM lumbar  spine, , hips and knees.  Skin: Intact, no ulcerations or rash noted.no bruising over area of trauma  Psych: Good eye contact, normal affect. Memory intact  anxious tearful and mildly  depressed appearing.  CNS: CN 2-12 intact, power,  normal throughout.no focal deficits noted.        Assessment & Plan:  Depression with anxiety Uncontrolled, pt not suicidal or homicidal, but will benefit from SSRI , which she has used in the past, she is to start  Fluoxetine. Has resisted psychotherapy to  help with anxiety issues in the past, and continue to do so  Back pain Acute back pain s/p recent fall, short course oif hydrocodone prescribed, with warning re sedative s/e and inc fall risk No evidence of bony injury or subcutaneous bleed with the fall  Type 2 diabetes mellitus with HbA1C goal below 8.0 Deteriorated  Patient educated about the importance of limiting  Carbohydrate intake , the need to commit to daily physical activity for a minimum of 30 minutes , and to commit weight loss. The fact that changes in all these areas will reduce or eliminate all together the development of diabetes is stressed.   \resume metformin  HTN (hypertension) Controlled, no change in medication   History of recent fall Home safety reviewed, and importance of working on balance and muscle strength to reduce possibility of repeat fall and possible fracture

## 2013-07-08 IMAGING — US US EXTREM LOW VENOUS*L*
1 series · 14 of 24 positions shown · non-contrast
Comparison: None.

CLINICAL DATA: Left lower extremity pain and edema.  History of DVT
and pulmonary embolism.

LEFT LOWER EXTREMITY VENOUS DUPLEX ULTRASOUND
TECHNIQUE: Gray-scale sonography with graded compression, as well
as color Doppler and duplex ultrasound were performed to evaluate
the deep venous system of the lower extremity from the level of the
common femoral vein through the popliteal and proximal calf veins.
Spectral Doppler was utilized to evaluate flow at rest and with
distal augmentation maneuvers.

[Series 1: us extrem low venous*left* · 14 of 27 slices shown]
[im 1/27]
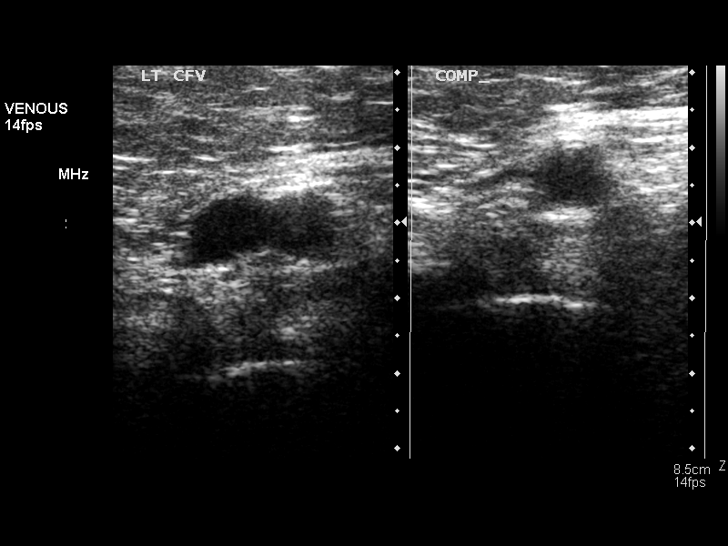
[im 3/27]
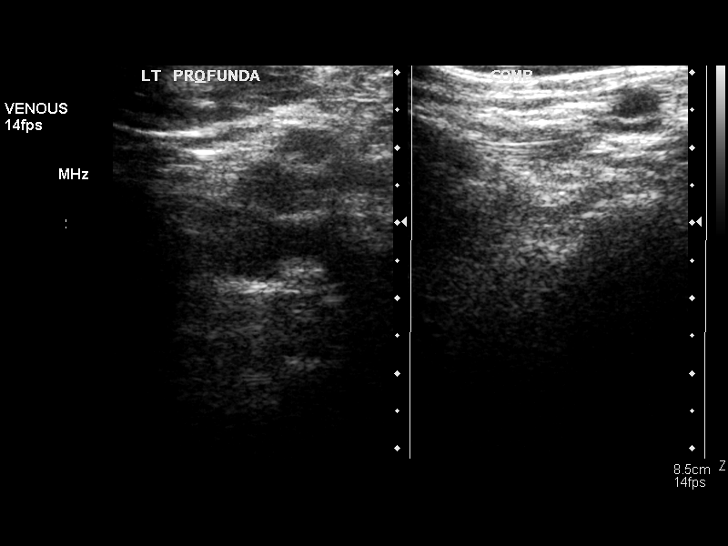
[im 5/27]
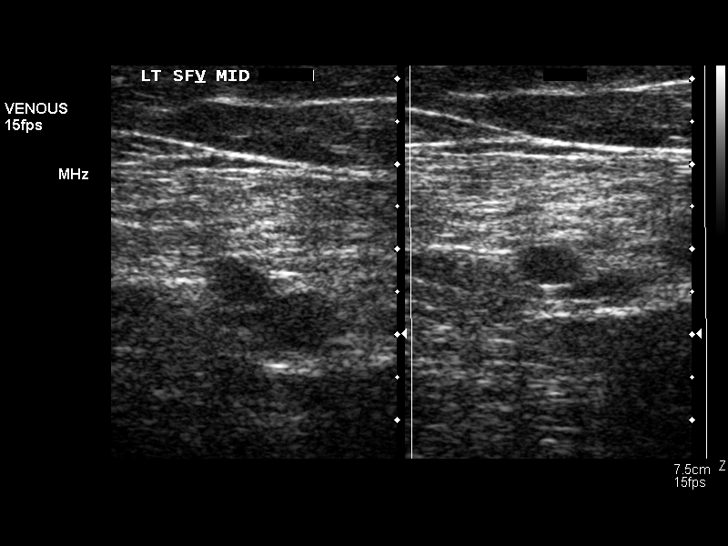
[im 7/27]
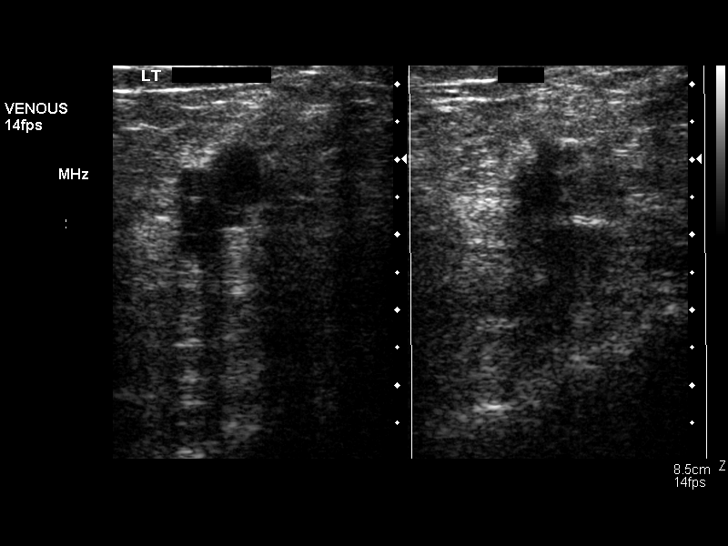
[im 8/27]
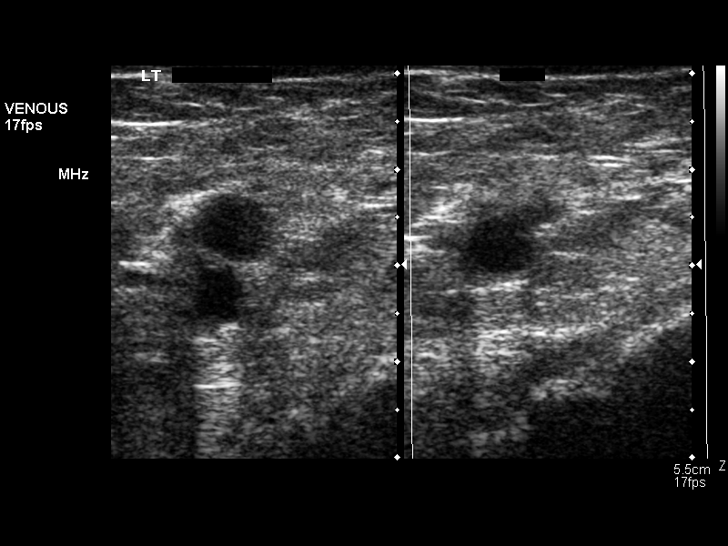
[im 11/27]
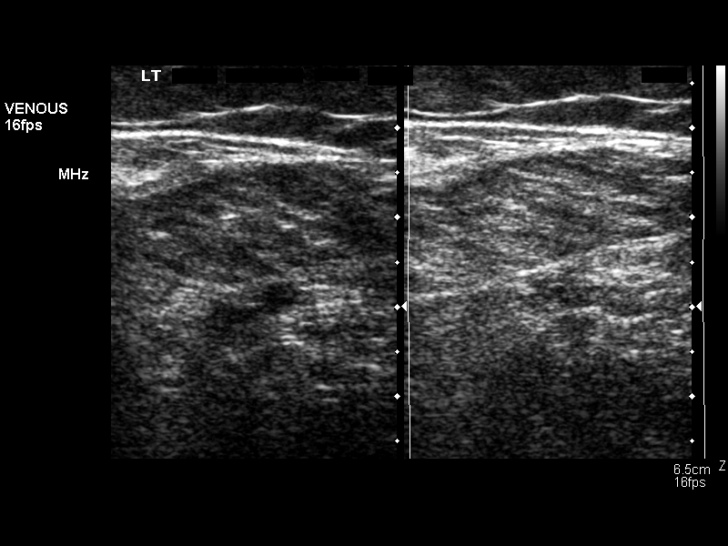
[im 13/27]
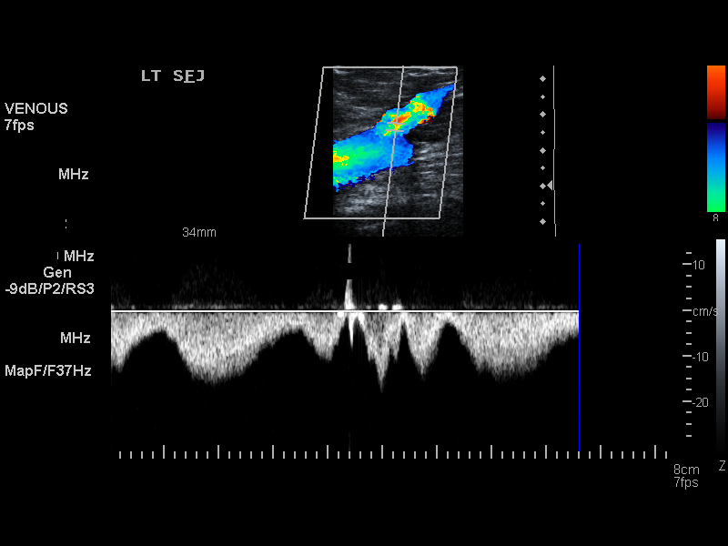
[im 14/27]
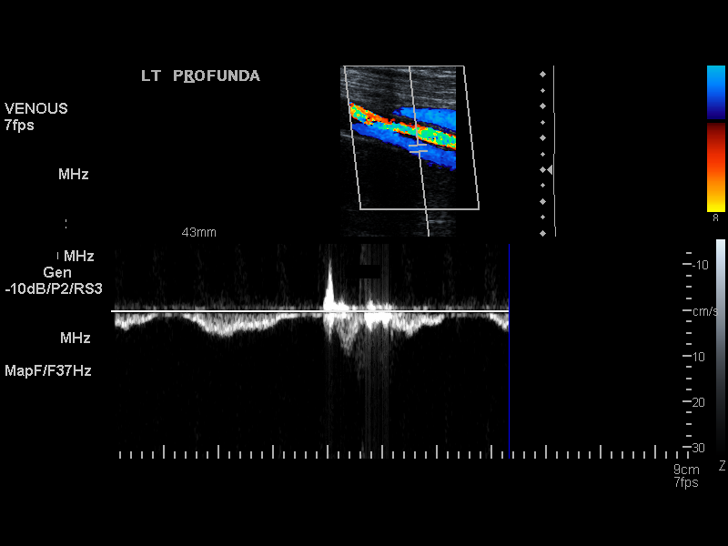
[im 16/27]
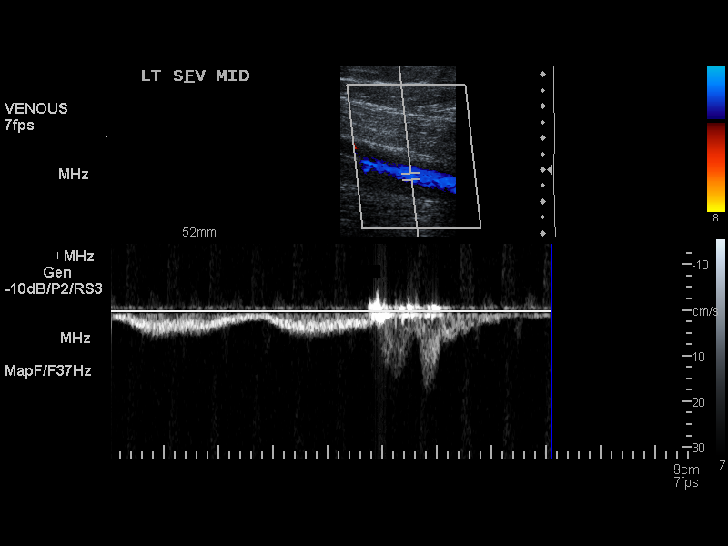
[im 19/27]
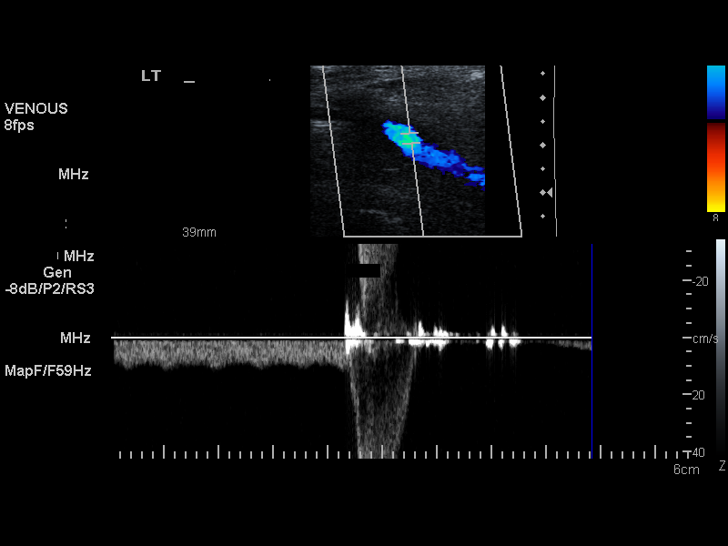
[im 21/27]
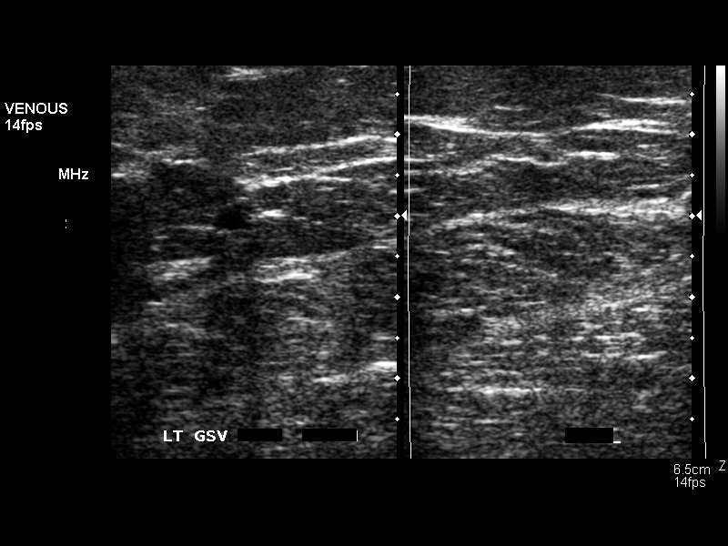
[im 22/27]
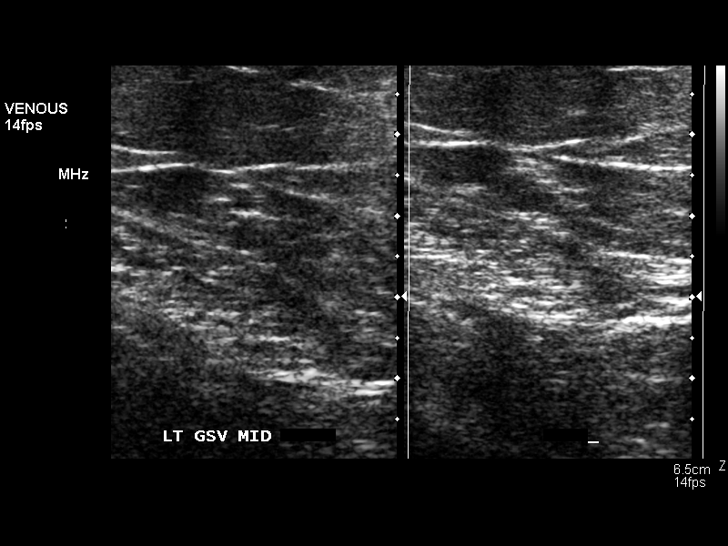
[im 24/27]
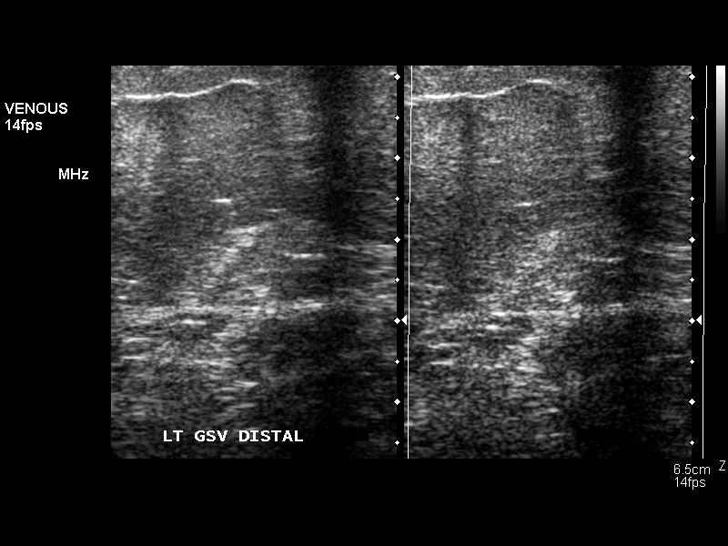
[im 27/27]
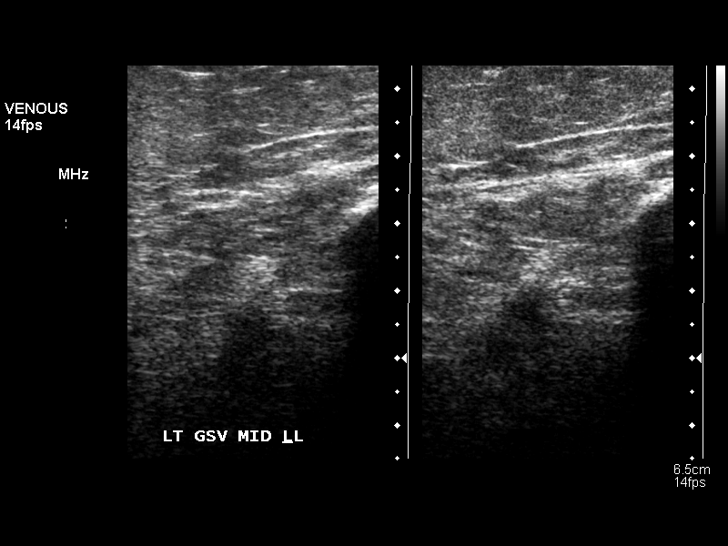

[14 of 24 positions shown; findings below may reference images not displayed]

FINDINGS: Normal compressibility of the common femoral,
superficial femoral, and popliteal veins is demonstrated, as well
as the visualized proximal calf veins.  No filling defects to
suggest DVT on grayscale or color Doppler imaging.  Doppler
waveforms show normal direction of venous flow, normal respiratory
phasicity and response to augmentation.
IMPRESSION: No evidence of left lower extremity deep vein thrombosis.

## 2013-07-17 ENCOUNTER — Other Ambulatory Visit: Payer: Self-pay

## 2013-07-17 DIAGNOSIS — I10 Essential (primary) hypertension: Secondary | ICD-10-CM

## 2013-07-17 MED ORDER — LOSARTAN POTASSIUM-HCTZ 100-12.5 MG PO TABS: 1.0000 | ORAL_TABLET | Freq: Every day | ORAL | Status: DC

## 2013-07-26 ENCOUNTER — Other Ambulatory Visit: Payer: Self-pay

## 2013-07-26 ENCOUNTER — Telehealth: Payer: Self-pay | Admitting: Family Medicine

## 2013-07-26 MED ORDER — TOPIRAMATE 100 MG PO TABS: 100.0000 mg | ORAL_TABLET | Freq: Two times a day (BID) | ORAL | Status: DC

## 2013-07-26 MED ORDER — SUMATRIPTAN SUCCINATE 100 MG PO TABS: ORAL_TABLET | ORAL | Status: DC

## 2013-07-27 NOTE — Telephone Encounter (Signed)
Patient spoke with courtney and is aware they will only pay for 9 pills/month

## 2013-07-27 NOTE — Telephone Encounter (Signed)
Imatrex script sent in per patient request

## 2013-08-03 ENCOUNTER — Other Ambulatory Visit: Payer: Self-pay | Admitting: *Deleted

## 2013-08-03 ENCOUNTER — Telehealth: Payer: Self-pay | Admitting: Cardiology

## 2013-08-03 MED ORDER — DABIGATRAN ETEXILATE MESYLATE 150 MG PO CAPS: 150.0000 mg | ORAL_CAPSULE | Freq: Two times a day (BID) | ORAL | Status: DC

## 2013-08-03 NOTE — Telephone Encounter (Signed)
Pt has [employer]. Could not get her help through [facility] due to having insurance.  Let pt know that I will give her samples until refill comes to her house, and that I refilled her Pradaxa through [facility] mail order. Pt should have a 90 day supply with 3 refills.

## 2013-08-20 ENCOUNTER — Ambulatory Visit: Payer: Medicare HMO | Admitting: Family Medicine

## 2013-08-22 ENCOUNTER — Other Ambulatory Visit: Payer: Self-pay

## 2013-08-22 MED ORDER — PRAVASTATIN SODIUM 80 MG PO TABS: 80.0000 mg | ORAL_TABLET | Freq: Every evening | ORAL | Status: DC

## 2013-08-24 ENCOUNTER — Other Ambulatory Visit: Payer: Self-pay

## 2013-08-24 DIAGNOSIS — I10 Essential (primary) hypertension: Secondary | ICD-10-CM

## 2013-08-24 MED ORDER — IMIPRAMINE HCL 50 MG PO TABS: 100.0000 mg | ORAL_TABLET | Freq: Every day | ORAL | Status: DC

## 2013-08-24 MED ORDER — PROPRANOLOL HCL 80 MG PO TABS: 80.0000 mg | ORAL_TABLET | Freq: Every day | ORAL | Status: DC

## 2013-08-24 MED ORDER — TEMAZEPAM 30 MG PO CAPS: 30.0000 mg | ORAL_CAPSULE | Freq: Every day | ORAL | Status: DC

## 2013-08-24 MED ORDER — SUMATRIPTAN SUCCINATE 100 MG PO TABS: ORAL_TABLET | ORAL | Status: DC

## 2013-09-13 ENCOUNTER — Telehealth: Payer: Self-pay | Admitting: *Deleted

## 2013-09-13 ENCOUNTER — Other Ambulatory Visit: Payer: Self-pay | Admitting: *Deleted

## 2013-09-13 MED ORDER — DABIGATRAN ETEXILATE MESYLATE 150 MG PO CAPS: 150.0000 mg | ORAL_CAPSULE | Freq: Two times a day (BID) | ORAL | Status: DC

## 2013-09-13 NOTE — Telephone Encounter (Signed)
Pt needs pradaxa called in to [facility], States that lisa told her to get the zero co-payment brand this time. Pt is just about out of meds/tmj

## 2013-09-13 NOTE — Telephone Encounter (Signed)
Medication would not go through per company changed names. [place] [facility] now [facility] and they stated that the pt never refilled in June that they tried to call the pt to verify insurance. [employer] states they will call the pt.

## 2013-09-13 NOTE — Telephone Encounter (Signed)
Medication sent via [facility].  

## 2013-09-20 ENCOUNTER — Telehealth: Payer: Self-pay | Admitting: *Deleted

## 2013-09-20 NOTE — Telephone Encounter (Signed)
Pt called wanting the zero kind of RX the kind that don't take a 100$ some dollars out, pt said the name of medication is gradaxa pt wants the zero co payment. [employer] drafted a 100$ out of her account for this. Please advise [phone]

## 2013-09-20 NOTE — Telephone Encounter (Signed)
PT states that the we told her that she could get free medication for her blood thinner. States that we ordered it wrong because she has a deduction out of her checking account from [facility] for over one hundred dollars for the medication.

## 2013-09-20 NOTE — Telephone Encounter (Signed)
Made pt aware that we do not do the zero co payment that she would have to call [employer].

## 2013-09-21 ENCOUNTER — Telehealth: Payer: Self-pay | Admitting: Cardiovascular Disease

## 2013-09-21 NOTE — Telephone Encounter (Signed)
Please call patient regarding getting generic medication.  tgs

## 2013-09-21 NOTE — Telephone Encounter (Signed)
Pt asked if she could take generic potassium and I told her it was fine.

## 2013-09-24 NOTE — Telephone Encounter (Signed)
Patient called and had this resolved with cardiology.

## 2013-10-25 ENCOUNTER — Other Ambulatory Visit: Payer: Self-pay

## 2013-10-25 MED ORDER — SUMATRIPTAN SUCCINATE 100 MG PO TABS: ORAL_TABLET | ORAL | Status: DC

## 2013-10-29 ENCOUNTER — Telehealth: Payer: Self-pay | Admitting: Family Medicine

## 2013-10-29 MED ORDER — AMLODIPINE BESYLATE 10 MG PO TABS: 10.0000 mg | ORAL_TABLET | Freq: Every day | ORAL | Status: DC

## 2013-10-29 MED ORDER — IMIPRAMINE HCL 50 MG PO TABS: 100.0000 mg | ORAL_TABLET | Freq: Every day | ORAL | Status: DC

## 2013-10-29 MED ORDER — TOPIRAMATE 100 MG PO TABS: 100.0000 mg | ORAL_TABLET | Freq: Two times a day (BID) | ORAL | Status: DC

## 2013-10-29 MED ORDER — TEMAZEPAM 30 MG PO CAPS: 30.0000 mg | ORAL_CAPSULE | Freq: Every day | ORAL | Status: DC

## 2013-10-29 MED ORDER — PRAVASTATIN SODIUM 80 MG PO TABS: 80.0000 mg | ORAL_TABLET | Freq: Every evening | ORAL | Status: DC

## 2013-10-29 NOTE — Telephone Encounter (Signed)
Med refilled.

## 2013-11-15 ENCOUNTER — Encounter (INDEPENDENT_AMBULATORY_CARE_PROVIDER_SITE_OTHER): Payer: Self-pay

## 2013-11-15 ENCOUNTER — Encounter: Payer: Self-pay | Admitting: Family Medicine

## 2013-11-15 ENCOUNTER — Ambulatory Visit (INDEPENDENT_AMBULATORY_CARE_PROVIDER_SITE_OTHER): Payer: Medicare HMO | Admitting: Family Medicine

## 2013-11-15 VITALS — BP 134/80 | HR 82 | Temp 98.2°F | Resp 18 | Ht 65.0 in | Wt 216.1 lb

## 2013-11-15 DIAGNOSIS — E1149 Type 2 diabetes mellitus with other diabetic neurological complication: Secondary | ICD-10-CM | POA: Insufficient documentation

## 2013-11-15 DIAGNOSIS — E785 Hyperlipidemia, unspecified: Secondary | ICD-10-CM

## 2013-11-15 DIAGNOSIS — J209 Acute bronchitis, unspecified: Secondary | ICD-10-CM

## 2013-11-15 DIAGNOSIS — J019 Acute sinusitis, unspecified: Secondary | ICD-10-CM | POA: Insufficient documentation

## 2013-11-15 DIAGNOSIS — E119 Type 2 diabetes mellitus without complications: Secondary | ICD-10-CM

## 2013-11-15 DIAGNOSIS — G43009 Migraine without aura, not intractable, without status migrainosus: Secondary | ICD-10-CM

## 2013-11-15 DIAGNOSIS — J018 Other acute sinusitis: Secondary | ICD-10-CM

## 2013-11-15 DIAGNOSIS — I10 Essential (primary) hypertension: Secondary | ICD-10-CM

## 2013-11-15 DIAGNOSIS — E1165 Type 2 diabetes mellitus with hyperglycemia: Secondary | ICD-10-CM

## 2013-11-15 DIAGNOSIS — Z794 Long term (current) use of insulin: Secondary | ICD-10-CM

## 2013-11-15 MED ORDER — HYDROCOD POLST-CHLORPHEN POLST 10-8 MG/5ML PO LQCR: ORAL | Status: DC

## 2013-11-15 MED ORDER — PENICILLIN V POTASSIUM 500 MG PO TABS: 500.0000 mg | ORAL_TABLET | Freq: Three times a day (TID) | ORAL | Status: DC

## 2013-11-15 MED ORDER — CEFTRIAXONE SODIUM 1 G IJ SOLR
500.0000 mg | Freq: Once | INTRAMUSCULAR | Status: AC
  Administered 2013-11-15: 500 mg via INTRAMUSCULAR

## 2013-11-15 NOTE — Patient Instructions (Addendum)
Annual wellness in December, call if you need me before  Pls come for flu vaccine in 2 weeks  You are treated today for acute sinusitis and bronchitis, Rocephin in office, penicillin and tussionex prescribed  Use sugar free robuitussin dM to help with chest congestion and cough  Important that you check feet daily to ensure no cuts or sores since you do not feel well on the sole of your left foot in particular  Labs today CBC, hBA1C, cmp and EGFr, lipid  microalb from office today 

## 2013-11-15 NOTE — Progress Notes (Signed)
   Subjective:    Patient ID: [name], female    DOB: 1940-11-24, 73 y.o.   MRN: [id number]  HPI 1 week h/o worsening head and chest congestion, associated with fever and chills intermittently. Nasal drainage has thickened , and is yellowish green, and at times bloody. Sputum is thick and yellow. C/o bilateral ear pressure, denies hearing loss and sore throat. Increasing fatigue , poor appetitie and sleep disturbed by cough. No improvement with OTC medication. Excess cough is making her vomit   Review of Systems See HPI  Denies chest pains, palpitations and leg swelling Denies abdominal pain, nausea,,diarrhea or constipation.   Denies dysuria, frequency, hesitancy or incontinence. Denies joint pain, swelling and limitation in mobility. Denies  seizures, numbness, or tingling.Headache with excess cough Denies depression, anxiety or insomnia. Denies skin break down or rash.        Objective:   Physical Exam BP 134/80  Pulse 82  Temp(Src) 98.2 F (36.8 C)  Resp 18  Ht 5\' 5"  (1.651 m)  Wt 216 lb 1.3 oz (98.013 kg)  BMI 35.96 kg/m2  SpO2 98% Patient alert and oriented and in mild cardiopulmonary distress.  HEENT: No facial asymmetry, EOMI,   oropharynx pink and moist.  Neck supple no JVD, bilateral anterior cervical adenitis. TM clear bilaterally, frontal sinus tenderness, erythema and edema of nasal mucosa  Chest: adequate air entry bilaterally scattered crackles  CVS: S1, S2 no murmurs, no S3.Regular rate.  ABD: Soft non tender.   Ext: No edema  MS: Adequate ROM spine, shoulders, hips and knees.  Skin: Intact, no ulcerations or rash noted.  Psych: Good eye contact, normal affect. Memory intact not anxious or depressed appearing.  CNS: CN 2-12 intact, power,  normal throughout.no focal deficits noted.        Assessment & Plan:  Acute bronchitis Rocephin in office followed by penicillin and cough suppressant  Acute sinusitis Rocephin in office  then p oral antibioitc  Type 2 diabetes mellitus with HbA1C goal below 8.0 Controlled, no change in medication Patient educated about the importance of limiting  Carbohydrate intake , the need to commit to daily physical activity for a minimum of 30 minutes , and to commit weight loss. The fact that changes in all these areas will reduce or eliminate all together the development of diabetes is stressed.     Hyperlipemia Controlled, no change in medication Hyperlipidemia:Low fat diet discussed and encouraged.    HTN (hypertension) Controlled, no change in medication   Migraine Controlled, no change in medication

## 2013-11-16 ENCOUNTER — Other Ambulatory Visit: Payer: Self-pay | Admitting: Family Medicine

## 2013-11-16 ENCOUNTER — Telehealth: Payer: Self-pay

## 2013-11-16 LAB — LIPID PANEL
Cholesterol: 161 mg/dL (ref 0–200)
HDL: 60 mg/dL (ref >39)
LDL Cholesterol: 69 mg/dL (ref 0–99)
TRIGLYCERIDES: 162 mg/dL — AB (ref <150)
Total CHOL/HDL Ratio: 2.7 Ratio
VLDL: 32 mg/dL (ref 0–40)

## 2013-11-16 LAB — HEMOGLOBIN A1C
Hgb A1c MFr Bld: 6.8 % — ABNORMAL HIGH (ref <5.7)
MEAN PLASMA GLUCOSE: 148 mg/dL — AB (ref <117)

## 2013-11-16 LAB — COMPLETE METABOLIC PANEL WITHOUT GFR
ALT: 11 U/L (ref 0–35)
AST: 14 U/L (ref 0–37)
Albumin: 4.4 g/dL (ref 3.5–5.2)
Alkaline Phosphatase: 106 U/L (ref 39–117)
BUN: 13 mg/dL (ref 6–23)
CO2: 27 meq/L (ref 19–32)
Calcium: 9.5 mg/dL (ref 8.4–10.5)
Chloride: 105 meq/L (ref 96–112)
Creat: 1.17 mg/dL — ABNORMAL HIGH (ref 0.50–1.10)
GFR, Est African American: 53 mL/min — ABNORMAL LOW
GFR, Est Non African American: 46 mL/min — ABNORMAL LOW
Glucose, Bld: 128 mg/dL — ABNORMAL HIGH (ref 70–99)
Potassium: 4.3 meq/L (ref 3.5–5.3)
Sodium: 139 meq/L (ref 135–145)
Total Bilirubin: 0.3 mg/dL (ref 0.2–1.2)
Total Protein: 7.2 g/dL (ref 6.0–8.3)

## 2013-11-16 LAB — MICROALBUMIN / CREATININE URINE RATIO
Creatinine, Urine: 403.1 mg/dL
MICROALB UR: 12 mg/dL — AB (ref <2.0)
Microalb Creat Ratio: 29.8 mg/g (ref 0.0–30.0)

## 2013-11-16 LAB — CBC
HCT: 39.8 % (ref 36.0–46.0)
Hemoglobin: 13.5 g/dL (ref 12.0–15.0)
MCH: 28.9 pg (ref 26.0–34.0)
MCHC: 33.9 g/dL (ref 30.0–36.0)
MCV: 85.2 fL (ref 78.0–100.0)
Platelets: 292 10*3/uL (ref 150–400)
RBC: 4.67 MIL/uL (ref 3.87–5.11)
RDW: 15.1 % (ref 11.5–15.5)
WBC: 5.8 10*3/uL (ref 4.0–10.5)

## 2013-11-16 MED ORDER — LEVOFLOXACIN 500 MG PO TABS: 500.0000 mg | ORAL_TABLET | Freq: Every day | ORAL | Status: DC

## 2013-11-16 MED ORDER — HYDROXYZINE HCL 10 MG PO TABS: ORAL_TABLET | ORAL | Status: DC

## 2013-11-16 NOTE — Telephone Encounter (Signed)
Pt aware med sent 

## 2013-11-16 NOTE — Telephone Encounter (Signed)
pls let pt know if she develops difficulty breathing, wheezing , shortness of breath, needs to go to the ED Let her know I have documented on record the reaction she reports as an allergy  Let her know levaquin as an antibiotic , and hydroyzine for the itching have been sent in. Let her know the hysroxyzine could make her sleepy, so be careful about instability and falls when she takes this.yays twice daily, take onlty if not going out , and be carefyul, simiilar to benadryl

## 2013-11-16 NOTE — Telephone Encounter (Signed)
Pt thinks she is having a reaction to the penicillin. She was awake all night itching. Doesn't want to take anymore of it. Wants something else called in please.

## 2013-11-17 DIAGNOSIS — Z9181 History of falling: Secondary | ICD-10-CM | POA: Insufficient documentation

## 2013-11-17 NOTE — Assessment & Plan Note (Addendum)
Home safety reviewed, and importance of working on balance and muscle strength to reduce possibility of repeat fall and possible fracture

## 2013-11-17 NOTE — Assessment & Plan Note (Signed)
Controlled, no change in medication Hyperlipidemia:Low fat diet discussed and encouraged.  \ 

## 2013-11-17 NOTE — Assessment & Plan Note (Signed)
Controlled, no change in medication Patient educated about the importance of limiting  Carbohydrate intake , the need to commit to daily physical activity for a minimum of 30 minutes , and to commit weight loss. The fact that changes in all these areas will reduce or eliminate all together the development of diabetes is stressed.    

## 2013-11-17 NOTE — Assessment & Plan Note (Signed)
Controlled, no change in medication  

## 2013-11-17 NOTE — Assessment & Plan Note (Signed)
Deteriorated  Patient educated about the importance of limiting  Carbohydrate intake , the need to commit to daily physical activity for a minimum of 30 minutes , and to commit weight loss. The fact that changes in all these areas will reduce or eliminate all together the development of diabetes is stressed.   \resume metformin

## 2013-11-17 NOTE — Assessment & Plan Note (Signed)
Acute back pain s/p recent fall, short course oif hydrocodone prescribed, with warning re sedative s/e and inc fall risk No evidence of bony injury or subcutaneous bleed with the fall

## 2013-11-17 NOTE — Assessment & Plan Note (Signed)
Rocephin in office then p oral antibioitc

## 2013-11-17 NOTE — Assessment & Plan Note (Signed)
Rocephin in office followed by penicillin and cough suppressant

## 2013-11-17 NOTE — Assessment & Plan Note (Signed)
Uncontrolled, pt not suicidal or homicidal, but will benefit from SSRI , which she has used in the past, she is to start  Fluoxetine. Has resisted psychotherapy to help with anxiety issues in the past, and continue to do so

## 2013-11-22 ENCOUNTER — Telehealth: Payer: Self-pay | Admitting: *Deleted

## 2013-11-22 MED ORDER — PROMETHAZINE-DM 6.25-15 MG/5ML PO SYRP: 5.0000 mL | ORAL_SOLUTION | Freq: Every evening | ORAL | Status: DC | PRN

## 2013-11-22 NOTE — Telephone Encounter (Signed)
Pt is called requesting another rx of cough medicine per pt she is coughing so much she is peeing on herself, pt said she wants to go to church Sunday and does not want to have a cough, please advise.

## 2013-11-22 NOTE — Addendum Note (Signed)
Addended by: Eual Fines on: 11/22/2013 04:24 PM   Modules accepted: Orders

## 2013-11-22 NOTE — Telephone Encounter (Signed)
No more tussionex per Dr but ok to call in promethazine DM at bedtime to help with coughing

## 2013-12-19 ENCOUNTER — Telehealth: Payer: Self-pay | Admitting: Family Medicine

## 2013-12-20 ENCOUNTER — Other Ambulatory Visit: Payer: Self-pay

## 2013-12-20 DIAGNOSIS — J209 Acute bronchitis, unspecified: Secondary | ICD-10-CM

## 2013-12-20 MED ORDER — HYDROCOD POLST-CHLORPHEN POLST 10-8 MG/5ML PO LQCR
ORAL | Status: DC
Start: 2013-12-20 — End: 2014-04-23

## 2013-12-20 NOTE — Telephone Encounter (Signed)
Noted and refilled x 1

## 2013-12-20 NOTE — Telephone Encounter (Signed)
Please advise 

## 2013-12-20 NOTE — Telephone Encounter (Signed)
pls refill cough med x 1

## 2013-12-31 ENCOUNTER — Telehealth: Payer: Self-pay | Admitting: Family Medicine

## 2013-12-31 DIAGNOSIS — I10 Essential (primary) hypertension: Secondary | ICD-10-CM

## 2013-12-31 MED ORDER — LOSARTAN POTASSIUM-HCTZ 100-12.5 MG PO TABS: 1.0000 | ORAL_TABLET | Freq: Every day | ORAL | Status: DC

## 2013-12-31 MED ORDER — AMLODIPINE BESYLATE 10 MG PO TABS: 10.0000 mg | ORAL_TABLET | Freq: Every day | ORAL | Status: DC

## 2013-12-31 NOTE — Telephone Encounter (Signed)
Med refilled.

## 2014-01-01 ENCOUNTER — Ambulatory Visit: Payer: Medicare HMO

## 2014-01-01 ENCOUNTER — Ambulatory Visit (INDEPENDENT_AMBULATORY_CARE_PROVIDER_SITE_OTHER): Payer: Medicare HMO

## 2014-01-01 ENCOUNTER — Encounter (INDEPENDENT_AMBULATORY_CARE_PROVIDER_SITE_OTHER): Payer: Self-pay

## 2014-01-01 DIAGNOSIS — Z23 Encounter for immunization: Secondary | ICD-10-CM

## 2014-01-02 ENCOUNTER — Telehealth: Payer: Self-pay | Admitting: Cardiology

## 2014-01-02 NOTE — Telephone Encounter (Signed)
Patient states that her "blood thinner" costs too much and wants to know if she can be changed to something else. / tgs

## 2014-01-02 NOTE — Telephone Encounter (Signed)
Pt wants to d/c pradaxa due to cost. Will forward to Dr. Harl Bowie for alternatives.

## 2014-01-03 NOTE — Telephone Encounter (Signed)
Pt is medicare Part D and there is no Pt assistance for these patients

## 2014-01-03 NOTE — Telephone Encounter (Signed)
Can we look into how much eliquis 5mg  twice a day would cost for her? If expensive, can we try to get her in the assistance program   Zandra Abts MD

## 2014-01-04 NOTE — Telephone Encounter (Signed)
Can we look into savaysa 60mg  po qday and the cost of that for her, I think with medicare D that is cheaper  Zandra Abts MD

## 2014-01-04 NOTE — Telephone Encounter (Signed)
Forwarded to Dr. Branch.

## 2014-01-04 NOTE — Telephone Encounter (Signed)
Will switch to savaysa 60 mg when pt is finished with pradaxa she currently has enough to last through the first of 2016

## 2014-01-17 ENCOUNTER — Encounter: Payer: Medicare HMO | Admitting: Family Medicine

## 2014-01-28 ENCOUNTER — Telehealth: Payer: Self-pay | Admitting: Family Medicine

## 2014-01-28 DIAGNOSIS — I10 Essential (primary) hypertension: Secondary | ICD-10-CM

## 2014-01-28 MED ORDER — PRAVASTATIN SODIUM 80 MG PO TABS: 80.0000 mg | ORAL_TABLET | Freq: Every evening | ORAL | Status: DC

## 2014-01-28 MED ORDER — METFORMIN HCL ER 500 MG PO TB24: 500.0000 mg | ORAL_TABLET | Freq: Every day | ORAL | Status: DC

## 2014-01-28 MED ORDER — AMLODIPINE BESYLATE 10 MG PO TABS: 10.0000 mg | ORAL_TABLET | Freq: Every day | ORAL | Status: DC

## 2014-01-28 MED ORDER — PROPRANOLOL HCL 80 MG PO TABS: 80.0000 mg | ORAL_TABLET | Freq: Every day | ORAL | Status: DC

## 2014-01-28 MED ORDER — FLUOXETINE HCL 10 MG PO TABS: 10.0000 mg | ORAL_TABLET | Freq: Every day | ORAL | Status: DC

## 2014-01-28 MED ORDER — LOSARTAN POTASSIUM-HCTZ 100-12.5 MG PO TABS
1.0000 | ORAL_TABLET | Freq: Every day | ORAL | Status: DC
Start: 2014-01-28 — End: 2014-03-27

## 2014-01-28 MED ORDER — IMIPRAMINE HCL 50 MG PO TABS: 100.0000 mg | ORAL_TABLET | Freq: Every day | ORAL | Status: DC

## 2014-01-28 NOTE — Telephone Encounter (Signed)
Med refilled.

## 2014-02-01 ENCOUNTER — Other Ambulatory Visit: Payer: Self-pay | Admitting: Family Medicine

## 2014-02-14 ENCOUNTER — Telehealth: Payer: Self-pay | Admitting: *Deleted

## 2014-02-14 ENCOUNTER — Other Ambulatory Visit: Payer: Self-pay

## 2014-02-14 MED ORDER — TOPIRAMATE 100 MG PO TABS: 100.0000 mg | ORAL_TABLET | Freq: Two times a day (BID) | ORAL | Status: DC

## 2014-02-14 MED ORDER — TEMAZEPAM 30 MG PO CAPS: 30.0000 mg | ORAL_CAPSULE | Freq: Every day | ORAL | Status: DC

## 2014-02-14 NOTE — Telephone Encounter (Signed)
Pt called stating her topiramate needs to be refilled pt stated no one never called her and she is going to change doctor's because the nurses never do what they say that are going to do, pt states she only has 3 pills left and the nurse is sorry for not calling it in. Please advise [phone] and pt is requesting to be called back when medication is refilled.

## 2014-02-14 NOTE — Telephone Encounter (Signed)
Called and spoke with patient.  She has no concerns about nurses at this time that she voiced.  Meds requested are sent to pharmacy.

## 2014-02-19 ENCOUNTER — Other Ambulatory Visit: Payer: Self-pay

## 2014-03-20 ENCOUNTER — Encounter: Payer: Medicare HMO | Admitting: Family Medicine

## 2014-03-26 ENCOUNTER — Ambulatory Visit: Payer: Self-pay | Admitting: Cardiology

## 2014-03-27 ENCOUNTER — Telehealth: Payer: Self-pay | Admitting: Family Medicine

## 2014-03-27 DIAGNOSIS — I10 Essential (primary) hypertension: Secondary | ICD-10-CM

## 2014-03-27 MED ORDER — LOSARTAN POTASSIUM-HCTZ 100-12.5 MG PO TABS: 1.0000 | ORAL_TABLET | Freq: Every day | ORAL | Status: DC

## 2014-03-27 MED ORDER — AMLODIPINE BESYLATE 10 MG PO TABS: 10.0000 mg | ORAL_TABLET | Freq: Every day | ORAL | Status: DC

## 2014-03-27 NOTE — Telephone Encounter (Signed)
Meds sent

## 2014-04-19 ENCOUNTER — Telehealth: Payer: Self-pay | Admitting: Family Medicine

## 2014-04-22 NOTE — Telephone Encounter (Signed)
Called and spoke with patient and she is aware that she will need to be seen before a referral can be placed.  Given appointment for 3/8 @ 330

## 2014-04-23 ENCOUNTER — Ambulatory Visit (INDEPENDENT_AMBULATORY_CARE_PROVIDER_SITE_OTHER): Payer: Medicare HMO | Admitting: Family Medicine

## 2014-04-23 ENCOUNTER — Encounter: Payer: Self-pay | Admitting: Family Medicine

## 2014-04-23 VITALS — BP 156/84 | HR 76 | Resp 18 | Ht 65.0 in | Wt 227.0 lb

## 2014-04-23 DIAGNOSIS — Z23 Encounter for immunization: Secondary | ICD-10-CM | POA: Diagnosis not present

## 2014-04-23 DIAGNOSIS — I150 Renovascular hypertension: Secondary | ICD-10-CM

## 2014-04-23 DIAGNOSIS — K219 Gastro-esophageal reflux disease without esophagitis: Secondary | ICD-10-CM

## 2014-04-23 DIAGNOSIS — Z8249 Family history of ischemic heart disease and other diseases of the circulatory system: Secondary | ICD-10-CM | POA: Diagnosis not present

## 2014-04-23 DIAGNOSIS — E119 Type 2 diabetes mellitus without complications: Secondary | ICD-10-CM

## 2014-04-23 DIAGNOSIS — M25562 Pain in left knee: Secondary | ICD-10-CM

## 2014-04-23 DIAGNOSIS — Z7901 Long term (current) use of anticoagulants: Secondary | ICD-10-CM

## 2014-04-23 DIAGNOSIS — N3946 Mixed incontinence: Secondary | ICD-10-CM

## 2014-04-23 DIAGNOSIS — R51 Headache: Secondary | ICD-10-CM

## 2014-04-23 DIAGNOSIS — Z9181 History of falling: Secondary | ICD-10-CM

## 2014-04-23 DIAGNOSIS — E559 Vitamin D deficiency, unspecified: Secondary | ICD-10-CM

## 2014-04-23 DIAGNOSIS — R413 Other amnesia: Secondary | ICD-10-CM

## 2014-04-23 DIAGNOSIS — J302 Other seasonal allergic rhinitis: Secondary | ICD-10-CM

## 2014-04-23 DIAGNOSIS — M25561 Pain in right knee: Secondary | ICD-10-CM | POA: Diagnosis not present

## 2014-04-23 DIAGNOSIS — R05 Cough: Secondary | ICD-10-CM

## 2014-04-23 DIAGNOSIS — R059 Cough, unspecified: Secondary | ICD-10-CM

## 2014-04-23 DIAGNOSIS — G43119 Migraine with aura, intractable, without status migrainosus: Secondary | ICD-10-CM

## 2014-04-23 DIAGNOSIS — E785 Hyperlipidemia, unspecified: Secondary | ICD-10-CM

## 2014-04-23 DIAGNOSIS — R519 Headache, unspecified: Secondary | ICD-10-CM | POA: Insufficient documentation

## 2014-04-23 DIAGNOSIS — E669 Obesity, unspecified: Secondary | ICD-10-CM

## 2014-04-23 DIAGNOSIS — F418 Other specified anxiety disorders: Secondary | ICD-10-CM

## 2014-04-23 DIAGNOSIS — E66812 Obesity, class 2: Secondary | ICD-10-CM

## 2014-04-23 MED ORDER — DONEPEZIL HCL 5 MG PO TBDP: 5.0000 mg | ORAL_TABLET | Freq: Every day | ORAL | Status: DC

## 2014-04-23 NOTE — Patient Instructions (Addendum)
F/u in 2 month, call if you  need me before  Prevnar today  You are referred to Dr Percell [name] for knees  You are referred to headache clinic  You are referred for lung function test and to see Dr Luan Pulling  Labs today   New for memory is aricept 5mg  every evening for 6 weeks then  Up to 10 mg daiy   Yopu are referred for brain scan due to memory loss and headaches with f/h of aneurysm

## 2014-05-07 ENCOUNTER — Telehealth: Payer: Self-pay | Admitting: Family Medicine

## 2014-05-07 NOTE — Telephone Encounter (Signed)
Patient has Dr Luan Pulling on her insurance card for PCP and been speaking with patient about this and she has decided to keep Dr Luan Pulling on her card as her PCP because of her breathing problem the co pay will only be 10.00 instead of 45.00 at Dr Luan Pulling and she will not be coming back here

## 2014-05-07 NOTE — Telephone Encounter (Signed)
Noted, and I spoke  Directly with her and wished her well

## 2014-05-07 NOTE — Telephone Encounter (Signed)
Noted  

## 2014-05-12 DIAGNOSIS — Z23 Encounter for immunization: Secondary | ICD-10-CM | POA: Insufficient documentation

## 2014-05-12 NOTE — Assessment & Plan Note (Signed)
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

## 2014-05-12 NOTE — Assessment & Plan Note (Signed)
Maintained on topamax and propranolol for prophylaxis, but still experiencing a significant amount of headaches, will refer to neurology for further eval and managemennt No neurologic deficits on exam at this visit

## 2014-05-12 NOTE — Assessment & Plan Note (Addendum)
  Triglycerides  Elevated at visit, updated lab for next visit Hyperlipidemia:Low fat diet discussed and encouraged.  CMP Latest Ref Rng 11/15/2013 04/19/2013 12/22/2012  Glucose 70 - 99 mg/dL 128(H) 120(H) 142(H)  BUN 6 - 23 mg/dL 13 17 20   Creatinine 0.50 - 1.10 mg/dL 1.17(H) 1.13(H) 1.24(H)  Sodium 135 - 145 mEq/L 139 137 138  Potassium 3.5 - 5.3 mEq/L 4.3 3.7 3.6  Chloride 96 - 112 mEq/L 105 100 100  CO2 19 - 32 mEq/L 27 27 25   Calcium 8.4 - 10.5 mg/dL 9.5 10.0 9.2  Total Protein 6.0 - 8.3 g/dL 7.2 7.4 -  Total Bilirubin 0.2 - 1.2 mg/dL 0.3 0.3 -  Alkaline Phos 39 - 117 U/L 106 101 -  AST 0 - 37 U/L 14 13 -  ALT 0 - 35 U/L 11 15 -    Lipid Panel     Component Value Date/Time   CHOL 161 11/15/2013 1224   TRIG 162* 11/15/2013 1224   HDL 60 11/15/2013 1224   CHOLHDL 2.7 11/15/2013 1224   VLDL 32 11/15/2013 1224   LDLCALC 69 11/15/2013 1224

## 2014-05-12 NOTE — Assessment & Plan Note (Signed)
Pt to start aricept at 5 mg daily and will increase to 10 mg daily as tolerated after the initial 6 weeks, she is awrae of the potential GI side effects and is to call if problems

## 2014-05-12 NOTE — Assessment & Plan Note (Signed)
Improved on fluoxetine continue same

## 2014-05-12 NOTE — Assessment & Plan Note (Signed)
Deteriorated.   The importance of healthy food choices with portion control discussed. Encouraged to start a food diary, count calories and to consider  joining a support group. Sample diet sheets offered. Goals set by the patient for the next several months.   Wt Readings from Last 3 Encounters:  04/23/14 227 lb (102.967 kg)  11/15/13 216 lb 1.3 oz (98.013 kg)  07/02/13 220 lb 0.6 oz (99.809 kg)    Body mass index is 37.77 kg/(m^2).  Current exercise per week 30 minutes.

## 2014-05-12 NOTE — Assessment & Plan Note (Signed)
Controlled, no change in medication  Updated lab needed at/ before next visit. Patient educated about the importance of limiting  Carbohydrate intake , the need to commit to daily physical activity for a minimum of 30 minutes , and to commit weight loss. The fact that changes in all these areas will reduce or eliminate all together the development of diabetes is stressed.   Diabetic Labs Latest Ref Rng 11/15/2013 04/19/2013 12/22/2012 11/01/2012 07/31/2012  HbA1c <5.7 % 6.8(H) 6.7(H) - 6.3(H) -  Microalbumin <2.0 mg/dL 12.0(H) - - - -  Micro/Creat Ratio 0.0 - 30.0 mg/g 29.8 - - - -  Chol 0 - 200 mg/dL 161 164 - 170 -  HDL >39 mg/dL 60 68 - 77 -  Calc LDL 0 - 99 mg/dL 69 70 - 69 -  Triglycerides <150 mg/dL 162(H) 128 - 120 -  Creatinine 0.50 - 1.10 mg/dL 1.17(H) 1.13(H) 1.24(H) 1.18(H) 1.23(H)   BP/Weight 04/23/2014 11/15/2013 07/02/2013 06/24/2013 04/19/2013 04/11/2013 4/91/7915  Systolic BP 056 979 480 165 537 482 707  Diastolic BP 84 80 84 73 84 86 84  Wt. (Lbs) 227 216.08 220.04 215 215.08 215 212.12  BMI 37.77 35.96 36.62 33.67 35.79 33.67 33.21   Foot/eye exam completion dates 11/15/2013 01/21/2009  Foot exam Order - yes  Foot Form Completion Done -

## 2014-05-12 NOTE — Assessment & Plan Note (Signed)
Controlled, no change in medication  

## 2014-05-12 NOTE — Assessment & Plan Note (Signed)
Progressively worsened knee pain with instability, pt referred back to Dr Percell [name] for re evaluation

## 2014-05-12 NOTE — Progress Notes (Signed)
Subjective:    Patient ID: [name], female    DOB: [birth year], 74 y.o.   MRN: [id number]  HPI The PT is here for follow up and re-evaluation of chronic medical conditions, medication management and review of any available recent lab and radiology data.  Preventive health is updated, specifically  Cancer screening and Immunization.   She c/o increase knee pain and instability, and wants to return to her orthopedic Doc for re evaluation, she has had no  Falls, but feels unsteady. C/o chronic cough and congestion, and also wheezing. She will be referred to local pulmonary Doc for evaluation and mangement of this problem C/o chronic headaches uncontrolled and seemingly worsening, she has a longstanding h/o  Migraine headaches, but reports more breakthrough headaches, had a family member recently treated at the headache clinic and requests a referral there which I believe to be very appropriate. She denies any new symptoms with her headaches, she has no h/o new weakness or numbness or vision. She does report that one of her family members has an aneurysm so this is an additional concern of hers disturbance.She c/o increased forgetfulness, and states that she feels embarrassed about this and that her family does not understand  Denies polyuria, polydipsia, blurred vision , or hypoglycemic episodes.      Review of Systems See HPI Denies recent fever or chills. Denies sinus pressure, nasal congestion, ear pain or sore throat. C/o chronic cough and wheeze Denies chest pains, palpitations and leg swelling Denies abdominal pain, nausea, vomiting,diarrhea or constipation.   Denies dysuria, frequency, hesitancy or incontinence.  C/O: chronic depression, anxiety and difficulty with sleep, for which she takes medication Denies skin break down or rash.        Objective:   Physical Exam BP 156/84 mmHg  Pulse 76  Resp 18  Ht 5\' 5"  (1.651 m)  Wt 227 lb (102.967 kg)  BMI 37.77 kg/m2  SpO2  95% Patient alert and oriented and in no cardiopulmonary distress.  HEENT: No facial asymmetry, EOMI,   oropharynx pink and moist.  Neck adequate though reduced mobility, no JVD, no mass.  Chest: Clear to auscultation bilaterally.No crackles or wheezes heward  CVS: S1, S2 no murmurs, no S3.Regular rate.  ABD: Soft non tender.   Ext: No edema  MS: decreased  ROM spine,  and knees.  Skin: Intact, no ulcerations or rash noted.  Psych: Good eye contact, normal affect. Memory impaired  not anxious or depressed appearing.  CNS: CN 2-12 intact, power,  normal throughout.no focal deficits noted.        Assessment & Plan:  Memory loss Pt to start aricept at 5 mg daily and will increase to 10 mg daily as tolerated after the initial 6 weeks, she is awrae of the potential GI side effects and is to call if problems   Bilateral knee pain Progressively worsened knee pain with instability, pt referred back to Dr Percell [name] for re evaluation   Headache disorder Chronic uncontrolled headaches, requests evaluation and management at headache clinic which I feel is very appropriate, referral entered. Pt has a f/h of brain aneurysm, and hopefully will have an appt with the neurologist in the near future Clinical exam and complaint is negative for any focal deficits   Hyperlipemia  Triglycerides  Elevated at visit, updated lab for next visit Hyperlipidemia:Low fat diet discussed and encouraged.  CMP Latest Ref Rng 11/15/2013 04/19/2013 12/22/2012  Glucose 70 - 99 mg/dL 128(H) 120(H) 142(H)  BUN 6 -  23 mg/dL 13 17 20   Creatinine 0.50 - 1.10 mg/dL 1.17(H) 1.13(H) 1.24(H)  Sodium 135 - 145 mEq/L 139 137 138  Potassium 3.5 - 5.3 mEq/L 4.3 3.7 3.6  Chloride 96 - 112 mEq/L 105 100 100  CO2 19 - 32 mEq/L 27 27 25   Calcium 8.4 - 10.5 mg/dL 9.5 10.0 9.2  Total Protein 6.0 - 8.3 g/dL 7.2 7.4 -  Total Bilirubin 0.2 - 1.2 mg/dL 0.3 0.3 -  Alkaline Phos 39 - 117 U/L 106 101 -  AST 0 - 37 U/L 14 13 -    ALT 0 - 35 U/L 11 15 -    Lipid Panel     Component Value Date/Time   CHOL 161 11/15/2013 1224   TRIG 162* 11/15/2013 1224   HDL 60 11/15/2013 1224   CHOLHDL 2.7 11/15/2013 1224   VLDL 32 11/15/2013 1224   LDLCALC 69 11/15/2013 1224         HTN (hypertension) Uncontrolled, atr this visit , however  no change in medication DASH diet and commitment to daily physical activity as able discussed and encouraged, as a part of hypertension management. The importance of attaining a healthy weight is also discussed.  BP/Weight 04/23/2014 11/15/2013 07/02/2013 06/24/2013 04/19/2013 04/11/2013 09/25/5724  Systolic BP 203 559 741 638 453 646 803  Diastolic BP 84 80 84 73 84 86 84  Wt. (Lbs) 227 216.08 220.04 215 215.08 215 212.12  BMI 37.77 35.96 36.62 33.67 35.79 33.67 33.21    CMP Latest Ref Rng 11/15/2013 04/19/2013 12/22/2012  Glucose 70 - 99 mg/dL 128(H) 120(H) 142(H)  BUN 6 - 23 mg/dL 13 17 20   Creatinine 0.50 - 1.10 mg/dL 1.17(H) 1.13(H) 1.24(H)  Sodium 135 - 145 mEq/L 139 137 138  Potassium 3.5 - 5.3 mEq/L 4.3 3.7 3.6  Chloride 96 - 112 mEq/L 105 100 100  CO2 19 - 32 mEq/L 27 27 25   Calcium 8.4 - 10.5 mg/dL 9.5 10.0 9.2  Total Protein 6.0 - 8.3 g/dL 7.2 7.4 -  Total Bilirubin 0.2 - 1.2 mg/dL 0.3 0.3 -  Alkaline Phos 39 - 117 U/L 106 101 -  AST 0 - 37 U/L 14 13 -  ALT 0 - 35 U/L 11 15 -        Type 2 diabetes mellitus with HbA1C goal below 8.0 Controlled, no change in medication  Updated lab needed at/ before next visit. Patient educated about the importance of limiting  Carbohydrate intake , the need to commit to daily physical activity for a minimum of 30 minutes , and to commit weight loss. The fact that changes in all these areas will reduce or eliminate all together the development of diabetes is stressed.   Diabetic Labs Latest Ref Rng 11/15/2013 04/19/2013 12/22/2012 11/01/2012 07/31/2012  HbA1c <5.7 % 6.8(H) 6.7(H) - 6.3(H) -  Microalbumin <2.0 mg/dL 12.0(H) - - - -   Micro/Creat Ratio 0.0 - 30.0 mg/g 29.8 - - - -  Chol 0 - 200 mg/dL 161 164 - 170 -  HDL >39 mg/dL 60 68 - 77 -  Calc LDL 0 - 99 mg/dL 69 70 - 69 -  Triglycerides <150 mg/dL 162(H) 128 - 120 -  Creatinine 0.50 - 1.10 mg/dL 1.17(H) 1.13(H) 1.24(H) 1.18(H) 1.23(H)   BP/Weight 04/23/2014 11/15/2013 07/02/2013 06/24/2013 04/19/2013 04/11/2013 03/29/2480  Systolic BP 500 370 488 891 694 503 888  Diastolic BP 84 80 84 73 84 86 84  Wt. (Lbs) 227 216.08 220.04 215 215.08 215 212.12  BMI 37.77 35.96 36.62 33.67 35.79 33.67 33.21   Foot/eye exam completion dates 11/15/2013 01/21/2009  Foot exam Order - yes  Foot Form Completion Done -          Urinary incontinence Imipramine for bedtime use due to excessive nocturia   Depression with anxiety Improved on fluoxetine continue same   Cough C/o chronic cough and wheezing, needs formal pulmonary eval as no established diagnosis of asthma or specific lung disease, refer for PFT then to Dr Luan Pulling for further eval and management of he chronic cough syndrome   Seasonal allergies Controlled, no change in medication    Gastroesophageal reflux disease Controlled, no change in medication    Chronic anticoagulation Stable on pradaxa following bilateral PE in 2012   Migraine Maintained on topamax and propranolol for prophylaxis, but still experiencing a significant amount of headaches, will refer to neurology for further eval and managemennt No neurologic deficits on exam at this visit   Obesity, Class II, BMI 35-39.9 Deteriorated.   The importance of healthy food choices with portion control discussed. Encouraged to start a food diary, count calories and to consider  joining a support group. Sample diet sheets offered. Goals set by the patient for the next several months.   Wt Readings from Last 3 Encounters:  04/23/14 227 lb (102.967 kg)  11/15/13 216 lb 1.3 oz (98.013 kg)  07/02/13 220 lb 0.6 oz (99.809 kg)    Body mass index is  37.77 kg/(m^2).  Current exercise per week 30 minutes.

## 2014-05-12 NOTE — Assessment & Plan Note (Signed)
Chronic uncontrolled headaches, requests evaluation and management at headache clinic which I feel is very appropriate, referral entered. Pt has a f/h of brain aneurysm, and hopefully will have an appt with the neurologist in the near future Clinical exam and complaint is negative for any focal deficits

## 2014-05-12 NOTE — Assessment & Plan Note (Signed)
C/o chronic cough and wheezing, needs formal pulmonary eval as no established diagnosis of asthma or specific lung disease, refer for PFT then to Dr Luan Pulling for further eval and management of he chronic cough syndrome

## 2014-05-12 NOTE — Assessment & Plan Note (Addendum)
Uncontrolled, atr this visit , however  no change in medication DASH diet and commitment to daily physical activity as able discussed and encouraged, as a part of hypertension management. The importance of attaining a healthy weight is also discussed.  BP/Weight 04/23/2014 11/15/2013 07/02/2013 06/24/2013 04/19/2013 04/11/2013 4/82/7078  Systolic BP 675 449 201 007 121 975 883  Diastolic BP 84 80 84 73 84 86 84  Wt. (Lbs) 227 216.08 220.04 215 215.08 215 212.12  BMI 37.77 35.96 36.62 33.67 35.79 33.67 33.21    CMP Latest Ref Rng 11/15/2013 04/19/2013 12/22/2012  Glucose 70 - 99 mg/dL 128(H) 120(H) 142(H)  BUN 6 - 23 mg/dL 13 17 20   Creatinine 0.50 - 1.10 mg/dL 1.17(H) 1.13(H) 1.24(H)  Sodium 135 - 145 mEq/L 139 137 138  Potassium 3.5 - 5.3 mEq/L 4.3 3.7 3.6  Chloride 96 - 112 mEq/L 105 100 100  CO2 19 - 32 mEq/L 27 27 25   Calcium 8.4 - 10.5 mg/dL 9.5 10.0 9.2  Total Protein 6.0 - 8.3 g/dL 7.2 7.4 -  Total Bilirubin 0.2 - 1.2 mg/dL 0.3 0.3 -  Alkaline Phos 39 - 117 U/L 106 101 -  AST 0 - 37 U/L 14 13 -  ALT 0 - 35 U/L 11 15 -

## 2014-05-12 NOTE — Assessment & Plan Note (Signed)
Imipramine for bedtime use due to excessive nocturia

## 2014-05-14 ENCOUNTER — Encounter: Payer: Self-pay | Admitting: Cardiology

## 2014-05-14 ENCOUNTER — Ambulatory Visit (INDEPENDENT_AMBULATORY_CARE_PROVIDER_SITE_OTHER): Payer: Commercial Managed Care - HMO | Admitting: Cardiology

## 2014-05-14 VITALS — BP 136/82 | HR 85 | Ht 67.0 in | Wt 227.6 lb

## 2014-05-14 DIAGNOSIS — E08620 Diabetes mellitus due to underlying condition with diabetic dermatitis: Secondary | ICD-10-CM | POA: Diagnosis not present

## 2014-05-14 DIAGNOSIS — R011 Cardiac murmur, unspecified: Secondary | ICD-10-CM

## 2014-05-14 DIAGNOSIS — I2699 Other pulmonary embolism without acute cor pulmonale: Secondary | ICD-10-CM | POA: Diagnosis not present

## 2014-05-14 DIAGNOSIS — I10 Essential (primary) hypertension: Secondary | ICD-10-CM

## 2014-05-14 DIAGNOSIS — G4730 Sleep apnea, unspecified: Secondary | ICD-10-CM

## 2014-05-14 MED ORDER — ATORVASTATIN CALCIUM 40 MG PO TABS
40.0000 mg | ORAL_TABLET | Freq: Every day | ORAL | Status: DC
Start: 2014-05-14 — End: 2015-02-24

## 2014-05-14 MED ORDER — ATORVASTATIN CALCIUM 40 MG PO TABS: 40.0000 mg | ORAL_TABLET | Freq: Every day | ORAL | Status: DC

## 2014-05-14 NOTE — Progress Notes (Signed)
Clinical Summary [name] is a 74 y.o.female seen today for follow up of the following medical problems.   1. History of PE - occurred in 06/2010, per notes appeared to be unprovoked. Has been on chronic anticoagulation since. Denies any problems with bleeding.  - on chronic anticoagulation - INRs were labile on coumadin, switched to xarelto.  - reports xarelto caused diffuse numbness, changed to pradaxa which she is tolerating well  - denies any bleeding issues on pradaxa.   2. HTN - checks at home at times, unsure of values - compliant with meds  3. Hyperlipidemia - 11/2013 TC 161 TG 162 HDL 60 LDL 69 - compliant with statin  4. OSA screen +snoring, + daytime somnolence.   Past Medical History  Diagnosis Date  . Hyperlipidemia     takes Pravastatin daily  . Overweight(278.02)   . Depression   . Migraine   . Degenerative joint disease     Knees  . Glaucoma   . Pulmonary embolism May 2012    acute presentation, bilateral PE  . Chronic anticoagulation 07/21/2010  . Anemia, iron deficiency 2012    Evaluated by Dr. Oneida Alar; H&H of 9.3/30.8 with [place] in 10/2010; 3/3 positive Hemoccult cards in 07/2011  . Atrial fibrillation     takes Coumadin daily  . Hypertension     takes Hyzaar daily and Propranolol   . Pneumonia 1969    hx of  . Hx of migraines     last one about a yr ago;takes Topamax daily  . Joint pain   . Joint swelling   . Chronic back pain     related to knee pain  . History of gout     was on medication but taken off of several months ago  . Bruises easily     takes Coumadin  . Skin spots-aging   . Gastroesophageal reflux disease     takes Omeprazole daily  . Hx of colonic polyps   . Urinary frequency   . Nocturia   . History of blood transfusion 1969  . Diabetes mellitus     takes Metformin daily  . Glaucoma     uses eye drops at night  . Anxiety     was on Prozac for a couple of weeks  . Insomnia     takes Restoril prn      Allergies  Allergen Reactions  . Penicillins Other (See Comments)    Itching  . Buspar [Buspirone] Itching  . Fentanyl Itching    Patient just started the patch on 08-08-2011 and she has noted itching, but states that it is helping.  . Sulfonamide Derivatives Hives  . Codeine Itching and Rash     Current Outpatient Prescriptions  Medication Sig Dispense Refill  . amLODipine (NORVASC) 10 MG tablet Take 1 tablet (10 mg total) by mouth daily. 90 tablet 0  . dabigatran (PRADAXA) 150 MG CAPS capsule Take 1 capsule (150 mg total) by mouth every 12 (twelve) hours. 180 capsule 2  . donepezil (ARICEPT ODT) 5 MG disintegrating tablet Take 1 tablet (5 mg total) by mouth at bedtime. 42 tablet 0  . esomeprazole (NEXIUM) 40 MG capsule Take 40 mg by mouth daily before breakfast.    . FLUoxetine (PROZAC) 10 MG tablet Take 1 tablet (10 mg total) by mouth daily. 90 tablet 1  . fluticasone (FLONASE) 50 MCG/ACT nasal spray     . hydrOXYzine (ATARAX/VISTARIL) 10 MG tablet One tablet twice daily ,  as eneded, fo excessive itch 30 tablet 0  . imipramine (TOFRANIL) 50 MG tablet Take 2 tablets (100 mg total) by mouth at bedtime. 180 tablet 1  . losartan-hydrochlorothiazide (HYZAAR) 100-12.5 MG per tablet Take 1 tablet by mouth daily. 90 tablet 0  . meclizine (ANTIVERT) 25 MG tablet TAKE 1 TABLET 3 TIMES A DAY AS NEEDED 30 tablet 1  . metFORMIN (GLUCOPHAGE-XR) 500 MG 24 hr tablet Take 1 tablet (500 mg total) by mouth daily with breakfast. 90 tablet 1  . pravastatin (PRAVACHOL) 80 MG tablet Take 1 tablet (80 mg total) by mouth every evening. 90 tablet 1  . propranolol (INDERAL) 80 MG tablet Take 1 tablet (80 mg total) by mouth daily. 90 tablet 1  . senna-docusate (SENOKOT-S) 8.6-50 MG per tablet Take 2 tablets by mouth daily. 14 tablet 0  . SUMAtriptan (IMITREX) 100 MG tablet One tablet at headache onset, may repeat one time only in a 24 hour period, after 2 hours, if the headache persists. 9 tablet 1  .  temazepam (RESTORIL) 30 MG capsule Take 1 capsule (30 mg total) by mouth at bedtime. 90 capsule 1  . topiramate (TOPAMAX) 100 MG tablet Take 1 tablet (100 mg total) by mouth 2 (two) times daily. 180 tablet 0   No current facility-administered medications for this visit.   Facility-Administered Medications Ordered in Other Visits  Medication Dose Route Frequency Provider Last Rate Last Dose  . fentaNYL (SUBLIMAZE) injection 25-50 mcg  25-50 mcg Intravenous Q5 min PRN Lerry Liner, MD         Past Surgical History  Procedure Laterality Date  . Tonsillectomy    . Dilation and curettage of uterus    . Urethral dilation    . Knee arthroscopy w/ meniscectomy  90's    Left  . Colonoscopy  Jan 2002; 2012    2002: Dr. Tamala Julian, ext. hemorrhoids, nl colon; 2012-adenomatous polyps, gastritis on EGD  . Upper gastrointestinal endoscopy    . Orif ankle fracture Right 90's  . Givens capsule study  08/18/2011    Procedure: GIVENS CAPSULE STUDY;  Surgeon: Rogene Houston, MD;  Location: [facility];  Service: Endoscopy;  Laterality: N/A;  730  . Esophagogastroduodenoscopy  08/24/2011    ESOPHAGOGASTRODUODENOSCOPY; esophageal dilatation; Rogene Houston, MD;  . 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 Cataract extraction w/phaco Right 04/11/2012    Procedure: CATARACT EXTRACTION PHACO AND INTRAOCULAR LENS PLACEMENT (IOC);  Surgeon: Elta Guadeloupe T. Gershon Crane, MD;  Location: [facility];  Service: Ophthalmology;  Laterality: Right;  CDE=9.61  . Cataract extraction w/phaco Left 12/26/2012    Procedure: CATARACT EXTRACTION PHACO AND INTRAOCULAR LENS PLACEMENT (IOC);  Surgeon: Elta Guadeloupe T. Gershon Crane, MD;  Location: [facility];  Service: Ophthalmology;  Laterality: Left;  CDE:7.74     Allergies  Allergen Reactions  . Penicillins Other (See Comments)    Itching  . Buspar [Buspirone] Itching  . Fentanyl  Itching    Patient just started the patch on 08-08-2011 and she has noted itching, but states that it is helping.  . Sulfonamide Derivatives Hives  . Codeine Itching and Rash      Family History  Problem Relation Age of Onset  . Diabetes Mother   . Hypertension Mother   . Arthritis Mother   . Heart failure Mother   . Leukemia Father   . Diabetes Sister   .  Hypertension Sister   . Diabetes Brother   . Hypertension Brother   . Hypertension Brother   . Hypertension Brother   . Hypertension Brother   . Diabetes Brother   . Diabetes Brother   . Diabetes Brother   . Pulmonary embolism Sister   . Colon cancer Neg Hx   . Colon polyps Neg Hx      Social History [name] reports that she has never smoked. She has never used smokeless tobacco. [name] reports that she does not drink alcohol.   Review of Systems CONSTITUTIONAL: No weight loss, fever, chills, weakness or fatigue.  HEENT: Eyes: No visual loss, blurred vision, double vision or yellow sclerae.No hearing loss, sneezing, congestion, runny nose or sore throat.  SKIN: No rash or itching.  CARDIOVASCULAR: per HPI RESPIRATORY: No shortness of breath, cough or sputum.  GASTROINTESTINAL: No anorexia, nausea, vomiting or diarrhea. No abdominal pain or blood.  GENITOURINARY: No burning on urination, no polyuria NEUROLOGICAL: headaches  MUSCULOSKELETAL: knee pain LYMPHATICS: No enlarged nodes. No history of splenectomy.  PSYCHIATRIC: No history of depression or anxiety.  ENDOCRINOLOGIC: No reports of sweating, cold or heat intolerance. No polyuria or polydipsia.  Marland Kitchen   Physical Examination p 85 bp 136/82 Wt 227 lbs BMI 36 Gen: resting comfortably, no acute distress HEENT: no scleral icterus, pupils equal round and reactive, no palptable cervical adenopathy,  CV: RRR, 2/6 systolic, no JVD, no carotid bruits Resp: Clear to auscultation bilaterally GI: abdomen is soft, non-tender, non-distended, normal bowel sounds, no  hepatosplenomegaly MSK: extremities are warm, no edema.  Skin: warm, no rash Neuro:  no focal deficits Psych: appropriate affect    Assessment and Plan  1. 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 HTN - slightly above goal, will keep bp log x 2 weeks and submit  3. Hyperlipidemia - will change to high dose atorvastatin in setting of DM  4. OSA screen - refer for sleep study.     F/u 1 year  Arnoldo Lenis, M.D.

## 2014-05-14 NOTE — Patient Instructions (Addendum)
Your physician wants you to follow-up in: 1 year with Dr Bryna Colander will receive a reminder letter in the mail two months in advance. If you don't receive a letter, please call our office to schedule the follow-up appointment.  STOP Pravachol   START Atorvastatin 40 mg daily at dinner   Your physician has requested that you have an echocardiogram. Echocardiography is a painless test that uses sound waves to create images of your heart. It provides your doctor with information about the size and shape of your heart and how well your heart's chambers and valves are working. This procedure takes approximately one hour. There are no restrictions for this procedure.  You have been referred to Dr Luan Pulling for sleep apnea evaluation  Please keep blood pressure log for 2 weeks and call back with readings.   Please get FASTING lab work    Thank you for choosing [facility] !      Marland Kitchen

## 2014-05-14 NOTE — Addendum Note (Signed)
Addended by: Barbarann Ehlers A on: 05/14/2014 09:33 AM   Modules accepted: Orders

## 2014-05-17 ENCOUNTER — Other Ambulatory Visit (HOSPITAL_COMMUNITY): Payer: Self-pay

## 2014-05-17 ENCOUNTER — Other Ambulatory Visit: Payer: Self-pay | Admitting: Family Medicine

## 2014-05-21 ENCOUNTER — Telehealth: Payer: Self-pay | Admitting: Cardiology

## 2014-05-21 NOTE — Telephone Encounter (Signed)
Wants to know if she can be switched to Eliquis due to cost of Pradaxa/ tg

## 2014-05-21 NOTE — Telephone Encounter (Signed)
Please advise 

## 2014-05-22 NOTE — Telephone Encounter (Signed)
Patient aware. States she will stop by office to pick up samples.

## 2014-05-22 NOTE — Telephone Encounter (Signed)
Ok to change to eliquis 5mg  bid. She had side effects on xarelto, I would first give her a 1 month samples of eliquis 5mg  bid and see how she tolerates it. If does well then can make the change.   Zandra Abts MD

## 2014-05-24 ENCOUNTER — Other Ambulatory Visit: Payer: Self-pay | Admitting: Cardiology

## 2014-05-24 ENCOUNTER — Telehealth: Payer: Self-pay | Admitting: *Deleted

## 2014-05-24 NOTE — Telephone Encounter (Signed)
Please see refill bin / tg  °

## 2014-05-24 NOTE — Telephone Encounter (Signed)
Family member to pick up samples

## 2014-05-24 NOTE — Telephone Encounter (Signed)
Pt must do 30 day samples before we place order with [facility] as she had problems with other anticoagulants

## 2014-06-06 ENCOUNTER — Ambulatory Visit (HOSPITAL_COMMUNITY)
Admission: RE | Admit: 2014-06-06 | Discharge: 2014-06-06 | Disposition: A | Discharge status: Home / Self Care | Payer: Commercial Managed Care - HMO | Source: Ambulatory Visit | Attending: Family Medicine | Admitting: Family Medicine

## 2014-06-06 ENCOUNTER — Encounter: Payer: Self-pay | Admitting: Family Medicine

## 2014-06-06 ENCOUNTER — Ambulatory Visit (INDEPENDENT_AMBULATORY_CARE_PROVIDER_SITE_OTHER): Payer: Commercial Managed Care - HMO | Admitting: Family Medicine

## 2014-06-06 VITALS — BP 136/74 | HR 96 | Resp 18 | Ht 65.0 in | Wt 224.1 lb

## 2014-06-06 DIAGNOSIS — E8881 Metabolic syndrome: Secondary | ICD-10-CM

## 2014-06-06 DIAGNOSIS — F418 Other specified anxiety disorders: Secondary | ICD-10-CM

## 2014-06-06 DIAGNOSIS — I10 Essential (primary) hypertension: Secondary | ICD-10-CM | POA: Diagnosis not present

## 2014-06-06 DIAGNOSIS — E559 Vitamin D deficiency, unspecified: Secondary | ICD-10-CM | POA: Diagnosis not present

## 2014-06-06 DIAGNOSIS — E119 Type 2 diabetes mellitus without complications: Secondary | ICD-10-CM

## 2014-06-06 DIAGNOSIS — R413 Other amnesia: Secondary | ICD-10-CM

## 2014-06-06 DIAGNOSIS — R062 Wheezing: Secondary | ICD-10-CM | POA: Insufficient documentation

## 2014-06-06 DIAGNOSIS — G43101 Migraine with aura, not intractable, with status migrainosus: Secondary | ICD-10-CM

## 2014-06-06 DIAGNOSIS — R05 Cough: Secondary | ICD-10-CM | POA: Insufficient documentation

## 2014-06-06 DIAGNOSIS — M25561 Pain in right knee: Secondary | ICD-10-CM

## 2014-06-06 DIAGNOSIS — M25562 Pain in left knee: Secondary | ICD-10-CM

## 2014-06-06 DIAGNOSIS — R059 Cough, unspecified: Secondary | ICD-10-CM

## 2014-06-06 DIAGNOSIS — E785 Hyperlipidemia, unspecified: Secondary | ICD-10-CM

## 2014-06-06 DIAGNOSIS — Z1239 Encounter for other screening for malignant neoplasm of breast: Secondary | ICD-10-CM

## 2014-06-06 MED ORDER — SERTRALINE HCL 25 MG PO TABS: 25.0000 mg | ORAL_TABLET | Freq: Every day | ORAL | Status: DC

## 2014-06-06 MED ORDER — DONEPEZIL HCL 10 MG PO TABS: 10.0000 mg | ORAL_TABLET | Freq: Every day | ORAL | Status: DC

## 2014-06-06 NOTE — Patient Instructions (Addendum)
Annual wellness in 3 month, call if you need me before  Increase in aricept to 10 mg daily for memory  New for depression is zoloft 25 mg one daily  You are referred to podiatrist for foot care  You will be contacted with appointments for the neurologist, and Dr Percell [name] re knees  CXr to day for cough  Thanks for choosing [facility], we consider it a privelige to serve you.

## 2014-06-06 NOTE — Progress Notes (Signed)
Subjective:    Patient ID: [name], female    DOB: [birth year], 74 y.o.   MRN: [id number]  HPI The PT is here for follow up and re-evaluation of chronic medical conditions, medication management and review of any available recent lab and radiology data.  Preventive health is updated, specifically  Cancer screening and Immunization.   Questions or concerns regarding consultations or procedures which the PT has had in the interim are  addressed. The PT denies any adverse reactions to current medications since the last visit.  Here with her sister , [name] , who will accompany her to visits C/o uncontrolled headache, wants referral to a specific neurologist C/o increased right  knee pain and instability wants referral to ortho C/o increased memory loss and depression C/o weight gain which bothers her     Review of Systems See HPI Denies recent fever or chills. Denies sinus pressure, nasal congestion, ear pain or sore throat. Denies chest congestion, productive cough or wheezing. Denies chest pains, palpitations and leg swelling Denies abdominal pain, nausea, vomiting,diarrhea or constipation.   Denies dysuria, frequency, hesitancy or incontinence.  C/o uncontrolled headaches, has specific Doc she wishes to see her , denies  seizures, numbness, or tingling. Denies skin break down or rash.        Objective:   Physical Exam BP 136/74 mmHg  Pulse 96  Resp 18  Ht 5\' 5"  (1.651 m)  Wt 224 lb 1.9 oz (101.66 kg)  BMI 37.30 kg/m2  SpO2 98% Patient alert and oriented and in no cardiopulmonary distress.  HEENT: No facial asymmetry, EOMI,   oropharynx pink and moist.  Neck supple no JVD, no mass.  Chest: Clear to auscultation bilaterally.  CVS: S1, S2 no murmurs, no S3.Regular rate.  ABD: Soft non tender.   Ext: No edema  DZ:HGDJMEQAS ROM spine,   And right  Knees which has swelling and crepitus  Skin: Intact, no ulcerations or rash noted.  Psych: Good eye contact,  normal affect. Memory impaired  both anxious and  depressed appearing.  CNS: CN 2-12 intact, power,  normal throughout.no focal deficits noted.        Assessment & Plan:  HTN (hypertension) Controlled, no change in medication DASH diet and commitment to daily physical activity for a minimum of 30 minutes discussed and encouraged, as a part of hypertension management. The importance of attaining a healthy weight is also discussed.  BP/Weight 07/17/2014 07/09/2014 07/02/2014 06/06/2014 05/14/2014 04/23/2014 34/02/9620  Systolic BP 297 989 - 211 941 740 814  Diastolic BP 74 87 - 74 82 84 80  Wt. (Lbs) 220.8 213 223 224.12 227.6 227 216.08  BMI 34.57 33.35 37.11 37.3 35.64 37.77 35.96        Migraine Uncontrolled, still having persistent headaches, she is referred to neurology for evaluation and management  Morbid obesity Deteriorated. Patient re-educated about  the importance of commitment to a  minimum of 150 minutes of exercise per week.  The importance of healthy food choices with portion control discussed. Encouraged to start a food diary, count calories and to consider  joining a support group. Sample diet sheets offered. Goals set by the patient for the next several months.   Weight /BMI 07/17/2014 07/09/2014 07/02/2014  WEIGHT 220 lb 12.8 oz 213 lb 223 lb  HEIGHT 5\' 7"  5\' 7"  5\' 5"   BMI 34.57 kg/m2 33.35 kg/m2 37.11 kg/m2    Current exercise per week 30 minutes.   Type 2 diabetes mellitus with HbA1C  goal below 8.0 Ms. Heuberger is reminded of the importance of commitment to daily physical activity for 30 minutes or more, as able and the need to limit carbohydrate intake to 30 to 60 grams per meal to help with blood sugar control.   The need to take medication as prescribed, test blood sugar as directed, and to call between visits if there is a concern that blood sugar is uncontrolled is also discussed.   Ms. Brophy is reminded of the importance of daily foot exam, annual eye  examination, and good blood sugar, blood pressure and cholesterol control. After visit lab result available, deterioration in control, med adjustment made  Diabetic Labs Latest Ref Rng 07/09/2014 06/06/2014 11/15/2013 04/19/2013 12/22/2012  HbA1c <5.7 % - 7.8(H) 6.8(H) 6.7(H) -  Microalbumin <2.0 mg/dL - - 12.0(H) - -  Micro/Creat Ratio 0.0 - 30.0 mg/g - - 29.8 - -  Chol 0 - 200 mg/dL - - 161 164 -  HDL >39 mg/dL - - 60 68 -  Calc LDL 0 - 99 mg/dL - - 69 70 -  Triglycerides <150 mg/dL - - 162(H) 128 -  Creatinine 0.44 - 1.00 mg/dL 1.13(H) 0.87 1.17(H) 1.13(H) 1.24(H)   BP/Weight 07/17/2014 07/09/2014 07/02/2014 06/06/2014 05/14/2014 04/23/2014 02/21/7937  Systolic BP 030 092 - 330 076 226 333  Diastolic BP 74 87 - 74 82 84 80  Wt. (Lbs) 220.8 213 223 224.12 227.6 227 216.08  BMI 34.57 33.35 37.11 37.3 35.64 37.77 35.96   Foot/eye exam completion dates 11/15/2013 01/21/2009  Foot exam Order - yes  Foot Form Completion Done -         Hyperlipemia Hyperlipidemia:Low fat diet discussed and encouraged.   Lipid Panel  Lab Results  Component Value Date   CHOL 161 11/15/2013   HDL 60 11/15/2013   LDLCALC 69 11/15/2013   TRIG 162* 11/15/2013   CHOLHDL 2.7 11/15/2013   Needs to reduce fatty foods , no med change  Updated lab due     Depression with anxiety Uncontrolled, start zoloft, not suicidal or homicidal  Memory loss Dose increase in aricept to treating dose  Metabolic syndrome X The increased risk of cardiovascular disease associated with this diagnosis, and the need to consistently work on lifestyle to change this is discussed. Following  a  heart healthy diet ,commitment to 30 minutes of exercise at least 5 days per week, as well as control of blood sugar and cholesterol , and achieving a healthy weight are all the areas to be addressed .   Bilateral knee pain Worsened pain and instability refer to ortho for eval and management

## 2014-06-07 LAB — COMPLETE METABOLIC PANEL WITH GFR
ALBUMIN: 4.2 g/dL (ref 3.5–5.2)
ALK PHOS: 108 U/L (ref 39–117)
ALT: 13 U/L (ref 0–35)
AST: 13 U/L (ref 0–37)
BILIRUBIN TOTAL: 0.2 mg/dL (ref 0.2–1.2)
BUN: 13 mg/dL (ref 6–23)
CO2: 26 mEq/L (ref 19–32)
CREATININE: 0.87 mg/dL (ref 0.50–1.10)
Calcium: 9.6 mg/dL (ref 8.4–10.5)
Chloride: 102 mEq/L (ref 96–112)
GFR, Est African American: 76 mL/min
GFR, Est Non African American: 66 mL/min
GLUCOSE: 121 mg/dL — AB (ref 70–99)
Potassium: 4 mEq/L (ref 3.5–5.3)
Sodium: 139 mEq/L (ref 135–145)
Total Protein: 7.4 g/dL (ref 6.0–8.3)

## 2014-06-07 LAB — HEMOGLOBIN A1C
Hgb A1c MFr Bld: 7.8 % — ABNORMAL HIGH (ref <5.7)
MEAN PLASMA GLUCOSE: 177 mg/dL — AB (ref <117)

## 2014-06-07 LAB — RPR

## 2014-06-07 LAB — TSH: TSH: 1.098 u[IU]/mL (ref 0.350–4.500)

## 2014-06-07 LAB — VITAMIN B12: VITAMIN B 12: 438 pg/mL (ref 211–911)

## 2014-06-07 LAB — VITAMIN D 25 HYDROXY (VIT D DEFICIENCY, FRACTURES): VIT D 25 HYDROXY: 40 ng/mL (ref 30–100)

## 2014-06-12 ENCOUNTER — Telehealth: Payer: Self-pay | Admitting: Family Medicine

## 2014-06-12 DIAGNOSIS — G43119 Migraine with aura, intractable, without status migrainosus: Secondary | ICD-10-CM

## 2014-06-13 NOTE — Telephone Encounter (Signed)
Noted  

## 2014-06-13 NOTE — Telephone Encounter (Signed)
pls enter referral to clinic and Doc she wants for her headaches I will sign

## 2014-06-13 NOTE — Telephone Encounter (Signed)
Ok to enter referral for headache?

## 2014-06-14 NOTE — Addendum Note (Signed)
Addended by: Eual Fines on: 06/14/2014 08:40 AM   Modules accepted: Orders

## 2014-06-14 NOTE — Telephone Encounter (Signed)
referred

## 2014-06-18 ENCOUNTER — Telehealth: Payer: Self-pay | Admitting: *Deleted

## 2014-06-18 MED ORDER — DABIGATRAN ETEXILATE MESYLATE 150 MG PO CAPS: 150.0000 mg | ORAL_CAPSULE | Freq: Two times a day (BID) | ORAL | Status: DC

## 2014-06-18 NOTE — Telephone Encounter (Signed)
[name] would like to restart Pradaxa. Or another anticoagulant. Family states she will not take Eliquis. Spoke with Dr. Harl Bowie states can stop eliquis. Restart pradaxa 150mg  bid. Will call in inform patient.

## 2014-06-18 NOTE — Addendum Note (Signed)
Addended by: Levonne Hubert on: 06/18/2014 03:47 PM   Modules accepted: Orders, Medications

## 2014-06-18 NOTE — Telephone Encounter (Signed)
Leg cramps are not an associated side effect of eliquis. There is likely something else going on. I would suggest she see her primary for evaluation.    Zandra Abts MD

## 2014-06-18 NOTE — Telephone Encounter (Signed)
Patient called upset and crying stating that she is having cramps and knee pain after starting Eliquis. Will forward to Dr. Harl Bowie.

## 2014-06-20 ENCOUNTER — Other Ambulatory Visit: Payer: Self-pay | Admitting: Family Medicine

## 2014-06-20 MED ORDER — METFORMIN HCL 500 MG PO TABS: 500.0000 mg | ORAL_TABLET | Freq: Two times a day (BID) | ORAL | Status: DC

## 2014-06-20 NOTE — Addendum Note (Signed)
Addended by: Denman George B on: 06/20/2014 11:14 AM   Modules accepted: Orders

## 2014-06-25 ENCOUNTER — Ambulatory Visit: Payer: Self-pay | Admitting: Family Medicine

## 2014-06-25 DIAGNOSIS — M1712 Unilateral primary osteoarthritis, left knee: Secondary | ICD-10-CM | POA: Diagnosis not present

## 2014-06-25 DIAGNOSIS — M25562 Pain in left knee: Secondary | ICD-10-CM | POA: Diagnosis not present

## 2014-06-25 DIAGNOSIS — M25462 Effusion, left knee: Secondary | ICD-10-CM | POA: Diagnosis not present

## 2014-06-27 ENCOUNTER — Telehealth: Payer: Self-pay | Admitting: Family Medicine

## 2014-06-27 NOTE — Telephone Encounter (Signed)
noted 

## 2014-07-02 ENCOUNTER — Telehealth: Payer: Self-pay | Admitting: *Deleted

## 2014-07-02 ENCOUNTER — Encounter: Payer: Self-pay | Admitting: Neurology

## 2014-07-02 ENCOUNTER — Ambulatory Visit (INDEPENDENT_AMBULATORY_CARE_PROVIDER_SITE_OTHER): Payer: Commercial Managed Care - HMO | Admitting: Neurology

## 2014-07-02 VITALS — Ht 65.0 in | Wt 223.0 lb

## 2014-07-02 DIAGNOSIS — G43019 Migraine without aura, intractable, without status migrainosus: Secondary | ICD-10-CM

## 2014-07-02 DIAGNOSIS — R269 Unspecified abnormalities of gait and mobility: Secondary | ICD-10-CM | POA: Diagnosis not present

## 2014-07-02 MED ORDER — BUTALBITAL-APAP-CAFFEINE 50-325-40 MG PO TABS: 1.0000 | ORAL_TABLET | Freq: Four times a day (QID) | ORAL | Status: DC | PRN

## 2014-07-02 NOTE — Progress Notes (Signed)
PATIENT: [name] DOB: [birth year]  Chief Complaint  Patient presents with  . Migraines    She has a mild headache daily and estimates three migraines weekly.    HISTORICAL  ADIN LARICCIA is a 74 years old right-handed female, referred by her primary care physician Dr. Tula Nakayama, accompanied by her sister [name] for evaluation of frequent headaches  She has history of hypertension, diabetes, hyperlipidemia, atrial fibrillation, previous DVT, PE, on pradaxa treatment, history of left knee replacement in 2014, gait difficulty at baseline because of joints pain,  She reported a long history of migraine headaches, her bad headache usually started from left shoulder, occipital region, spreading forward to involving vortex, severe pounding headaches with associated light noise sensitivity, nauseous, throwing up  She used to have headaches few times each month, since January 2016, she reported excessive stress, her husband is ill, need her help, she been having headaches daily, severe pounding headaches, nausea, spending a lot of time in dark room in bed, not function well, she has been taking multiple dose of Tylenol, previously tried Excedrin Migraine without helping, she has tried Imitrex in the past, works well, but with her multiple comorbidities, triptan is no longer a good choice,  She is on multiple preventive medications, Topamax 100 mg twice a day, Inderal 80 mg daily, calcium blocker, imipramine 50 mg 2 tabs po qhs.  REVIEW OF SYSTEMS: Full 14 system review of systems performed and notable only for blurry vision, eye pain, ringing ears, spinning sensation, flushing, memory loss, confusion, headaches, weakness, dizziness, anxiety.  ALLERGIES: Allergies  Allergen Reactions  . Penicillins Other (See Comments)    Itching  . Buspar [Buspirone] Itching  . Fentanyl Itching    Patient just started the patch on 08-08-2011 and she has noted itching, but states that it is  helping.  . Sulfonamide Derivatives Hives  . Codeine Itching and Rash    HOME MEDICATIONS: Current Outpatient Prescriptions  Medication Sig Dispense Refill  . amLODipine (NORVASC) 10 MG tablet Take 1 tablet (10 mg total) by mouth daily. 90 tablet 0  . atorvastatin (LIPITOR) 40 MG tablet Take 1 tablet (40 mg total) by mouth daily. 90 tablet 3  . dabigatran (PRADAXA) 150 MG CAPS capsule Take 1 capsule (150 mg total) by mouth 2 (two) times daily. 180 capsule 3  . donepezil (ARICEPT) 10 MG tablet Take 1 tablet (10 mg total) by mouth at bedtime. 90 tablet 1  . esomeprazole (NEXIUM) 40 MG capsule Take 40 mg by mouth daily at 12 noon.    Marland Kitchen FLUoxetine (PROZAC) 10 MG tablet Take 1 tablet (10 mg total) by mouth daily. 90 tablet 1  . fluticasone (FLONASE) 50 MCG/ACT nasal spray     . imipramine (TOFRANIL) 50 MG tablet Take 2 tablets (100 mg total) by mouth at bedtime. 180 tablet 1  . losartan-hydrochlorothiazide (HYZAAR) 100-12.5 MG per tablet Take 1 tablet by mouth daily. 90 tablet 0  . meclizine (ANTIVERT) 25 MG tablet Take 25 mg by mouth 3 (three) times daily as needed for dizziness.    . metFORMIN (GLUCOPHAGE) 500 MG tablet Take 1 tablet (500 mg total) by mouth 2 (two) times daily with a meal. 180 tablet 0  . polyethylene glycol (MIRALAX / GLYCOLAX) packet Take 17 g by mouth daily as needed.    . pravastatin (PRAVACHOL) 80 MG tablet Take 80 mg by mouth daily.    . propranolol (INDERAL) 80 MG tablet Take 1 tablet (80 mg total)  by mouth daily. 90 tablet 1  . temazepam (RESTORIL) 30 MG capsule Take 1 capsule (30 mg total) by mouth at bedtime. 90 capsule 1  . topiramate (TOPAMAX) 100 MG tablet Take 1 tablet (100 mg total) by mouth 2 (two) times daily. 180 tablet 0   No current facility-administered medications for this visit.   Facility-Administered Medications Ordered in Other Visits  Medication Dose Route Frequency Provider Last Rate Last Dose  . fentaNYL (SUBLIMAZE) injection 25-50 mcg  25-50  mcg Intravenous Q5 min PRN Lerry Liner, MD        PAST MEDICAL HISTORY: Past Medical History  Diagnosis Date  . Hyperlipidemia     takes Pravastatin daily  . Overweight(278.02)   . Depression   . Migraine   . Degenerative joint disease     Knees  . Glaucoma   . Pulmonary embolism May 2012    acute presentation, bilateral PE  . Chronic anticoagulation 07/21/2010  . Anemia, iron deficiency 2012    Evaluated by Dr. Oneida Alar; H&H of 9.3/30.8 with [place] in 10/2010; 3/3 positive Hemoccult cards in 07/2011  . Atrial fibrillation     takes Coumadin daily  . Hypertension     takes Hyzaar daily and Propranolol   . Pneumonia 1969    hx of  . Hx of migraines     last one about a yr ago;takes Topamax daily  . Joint pain   . Joint swelling   . Chronic back pain     related to knee pain  . History of gout     was on medication but taken off of several months ago  . Bruises easily     takes Coumadin  . Skin spots-aging   . Gastroesophageal reflux disease     takes Omeprazole daily  . Hx of colonic polyps   . Urinary frequency   . Nocturia   . History of blood transfusion 1969  . Diabetes mellitus     takes Metformin daily  . Glaucoma     uses eye drops at night  . Anxiety     was on Prozac for a couple of weeks  . Insomnia     takes Restoril prn  . Memory loss     takes donepezil  . Migraine     PAST SURGICAL HISTORY: Past Surgical History  Procedure Laterality Date  . Tonsillectomy    . Dilation and curettage of uterus    . Urethral dilation    . Knee arthroscopy w/ meniscectomy  90's    Left  . Colonoscopy  Jan 2002; 2012    2002: Dr. Tamala Julian, ext. hemorrhoids, nl colon; 2012-adenomatous polyps, gastritis on EGD  . Upper gastrointestinal endoscopy    . Orif ankle fracture Right 90's  . Givens capsule study  08/18/2011    Procedure: GIVENS CAPSULE STUDY;  Surgeon: Rogene Houston, MD;  Location: [facility];  Service: Endoscopy;  Laterality: N/A;  730  .  Esophagogastroduodenoscopy  08/24/2011    ESOPHAGOGASTRODUODENOSCOPY; esophageal dilatation; Rogene Houston, MD;  . 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 Cataract extraction w/phaco Right 04/11/2012    Procedure: CATARACT EXTRACTION PHACO AND INTRAOCULAR LENS PLACEMENT (IOC);  Surgeon: Elta Guadeloupe T. Gershon Crane, MD;  Location: [facility];  Service: Ophthalmology;  Laterality: Right;  CDE=9.61  . Cataract extraction w/phaco Left 12/26/2012  Procedure: CATARACT EXTRACTION PHACO AND INTRAOCULAR LENS PLACEMENT (IOC);  Surgeon: Elta Guadeloupe T. Gershon Crane, MD;  Location: [facility];  Service: Ophthalmology;  Laterality: Left;  CDE:7.74    FAMILY HISTORY: Family History  Problem Relation Age of Onset  . Diabetes Mother   . Hypertension Mother   . Arthritis Mother   . Heart failure Mother   . Leukemia Father   . Diabetes Sister   . Hypertension Sister   . Diabetes Brother   . Hypertension Brother   . Hypertension Brother   . Hypertension Brother   . Hypertension Brother   . Diabetes Brother   . Diabetes Brother   . Diabetes Brother   . Pulmonary embolism Sister   . Colon cancer Neg Hx   . Colon polyps Neg Hx   . Migraines Father   . Migraines Mother   . Migraines Sister   . Kidney failure Mother   . Kidney failure Brother   . Kidney failure Father     SOCIAL HISTORY:  History   Social History  . Marital Status: Married    Spouse Name: N/A  . Number of Children: 2  . Years of Education: 11   Occupational History  . 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     Social History Main Topics  . Smoking status: Never Smoker   . Smokeless tobacco: Never Used  . Alcohol Use: No  . Drug Use: No  . Sexual Activity: Not Currently    Birth Control/ Protection: Post-menopausal   Other Topics Concern  . Not on file   Social History Narrative   Lives at home with husband.    Right-handed.   1 cup caffeine per day.   PHYSICAL EXAM   Filed Vitals:   07/02/14 1025  Height: 5\' 5"  (1.651 m)  Weight: 223 lb (101.152 kg)    Not recorded      Body mass index is 37.11 kg/(m^2).  PHYSICAL EXAMNIATION:  Gen: NAD, conversant, well nourised, obese, well groomed                     Cardiovascular: Regular rate rhythm, no peripheral edema, warm, nontender. Eyes: Conjunctivae clear without exudates or hemorrhage Neck: Supple, no carotid bruise. Pulmonary: Clear to auscultation bilaterally   NEUROLOGICAL EXAM:  MENTAL STATUS: Speech:    Speech is normal; fluent and spontaneous with normal comprehension.  Cognition:    The patient is oriented to person, place, and time;     recent and remote memory intact;     language fluent;     normal attention, concentration,     fund of knowledge.  CRANIAL NERVES: CN II: Visual fields are full to confrontation. Fundoscopic exam is normal with sharp discs and no vascular changes. Venous pulsations are present bilaterally. Pupils are 4 mm and briskly reactive to light. Visual acuity is 20/20 bilaterally. CN III, IV, VI: extraocular movement are normal. No ptosis. CN V: Facial sensation is intact to pinprick in all 3 divisions bilaterally. Corneal responses are intact.  CN VII: Face is symmetric with normal eye closure and smile. CN VIII: Hearing is normal to rubbing fingers CN IX, X: Palate elevates symmetrically. Phonation is normal. CN XI: Head turning and shoulder shrug are intact CN XII: Tongue is midline with normal movements and no atrophy.  MOTOR: There is no pronator drift of out-stretched arms. Muscle bulk and tone are normal. Muscle strength is normal.  REFLEXES: Reflexes are 2+  and symmetric at the biceps, triceps, knees, and ankles. Plantar responses are flexor.  SENSORY: Light touch, pinprick, position sense, and vibration sense are intact in fingers and toes.  COORDINATION: Rapid alternating  movements and fine finger movements are intact. There is no dysmetria on finger-to-nose and heel-knee-shin. There are no abnormal or extraneous movements.   GAIT/STANCE: Antalgic, jagging her left leg,   DIAGNOSTIC DATA (LABS, IMAGING, TESTING) - I reviewed patient records, labs, notes, testing and imaging myself where available.  Lab Results  Component Value Date   WBC 5.8 11/15/2013   HGB 13.5 11/15/2013   HCT 39.8 11/15/2013   MCV 85.2 11/15/2013   PLT 292 11/15/2013      Component Value Date/Time   NA 139 06/06/2014 1659   K 4.0 06/06/2014 1659   CL 102 06/06/2014 1659   CO2 26 06/06/2014 1659   GLUCOSE 121* 06/06/2014 1659   BUN 13 06/06/2014 1659   CREATININE 0.87 06/06/2014 1659   CREATININE 1.24* 12/22/2012 1420   CALCIUM 9.6 06/06/2014 1659   PROT 7.4 06/06/2014 1659   ALBUMIN 4.2 06/06/2014 1659   AST 13 06/06/2014 1659   ALT 13 06/06/2014 1659   ALKPHOS 108 06/06/2014 1659   BILITOT 0.2 06/06/2014 1659   GFRNONAA 66 06/06/2014 1659   GFRNONAA 42* 12/22/2012 1420   GFRAA 76 06/06/2014 1659   GFRAA 49* 12/22/2012 1420   Lab Results  Component Value Date   CHOL 161 11/15/2013   HDL 60 11/15/2013   LDLCALC 69 11/15/2013   TRIG 162* 11/15/2013   CHOLHDL 2.7 11/15/2013   Lab Results  Component Value Date   HGBA1C 7.8* 06/06/2014   Lab Results  Component Value Date   VITAMINB12 438 06/06/2014   Lab Results  Component Value Date   TSH 1.098 06/06/2014    ASSESSMENT AND PLAN  KEILANI TERRANCE is a 74 y.o. female with past medical history of hypertension, diabetes, lifelong history of migraine, now presenting with daily migraine in the setting of excessive stress, she been taking daily Tylenol  1.  Continue preventive medications, she is already on Topamax 100 mg twice a day, imipramine 50 mg 2 tablets every night, Inderal 80 mg daily 2, she is not good candidate for triptan treatment because of her comorbidity, will try Fioricet, limit the use to less  than 2-3 times each week 3, preauthorization paperwork for Botox injection as migraine prevention 4, return to clinic in one month    Marcial Pacas, M.D. Ph.D.  [facility] [address], [place], [place] [address] Ph: [phone] Fax: [phone]

## 2014-07-02 NOTE — Telephone Encounter (Signed)
No showed new patient appointment today. 

## 2014-07-02 NOTE — Patient Instructions (Signed)
Stopped daily Tylenol use  Magnesium oxide 400 mg twice a day Riboflavin 100 mg twice a day

## 2014-07-02 NOTE — Telephone Encounter (Signed)
Patient arrived late to the office.  Says she was told the incorrect appt time.  Dr. Krista Blue is going to see her today.

## 2014-07-09 ENCOUNTER — Encounter (HOSPITAL_COMMUNITY): Payer: Self-pay | Admitting: Emergency Medicine

## 2014-07-09 ENCOUNTER — Emergency Department (HOSPITAL_COMMUNITY)
Admission: EM | Admit: 2014-07-09 | Discharge: 2014-07-09 | Disposition: A | Discharge status: Home / Self Care | Payer: Commercial Managed Care - HMO | Attending: Emergency Medicine | Admitting: Emergency Medicine

## 2014-07-09 ENCOUNTER — Emergency Department (HOSPITAL_COMMUNITY): Payer: Commercial Managed Care - HMO

## 2014-07-09 DIAGNOSIS — Z8739 Personal history of other diseases of the musculoskeletal system and connective tissue: Secondary | ICD-10-CM | POA: Insufficient documentation

## 2014-07-09 DIAGNOSIS — K219 Gastro-esophageal reflux disease without esophagitis: Secondary | ICD-10-CM | POA: Insufficient documentation

## 2014-07-09 DIAGNOSIS — Z7902 Long term (current) use of antithrombotics/antiplatelets: Secondary | ICD-10-CM | POA: Insufficient documentation

## 2014-07-09 DIAGNOSIS — Z86010 Personal history of colonic polyps: Secondary | ICD-10-CM | POA: Diagnosis not present

## 2014-07-09 DIAGNOSIS — H409 Unspecified glaucoma: Secondary | ICD-10-CM | POA: Insufficient documentation

## 2014-07-09 DIAGNOSIS — Z880 Allergy status to penicillin: Secondary | ICD-10-CM | POA: Insufficient documentation

## 2014-07-09 DIAGNOSIS — R0789 Other chest pain: Secondary | ICD-10-CM | POA: Diagnosis not present

## 2014-07-09 DIAGNOSIS — R0602 Shortness of breath: Secondary | ICD-10-CM | POA: Diagnosis not present

## 2014-07-09 DIAGNOSIS — I4891 Unspecified atrial fibrillation: Secondary | ICD-10-CM | POA: Insufficient documentation

## 2014-07-09 DIAGNOSIS — G8929 Other chronic pain: Secondary | ICD-10-CM | POA: Diagnosis not present

## 2014-07-09 DIAGNOSIS — G4700 Insomnia, unspecified: Secondary | ICD-10-CM | POA: Diagnosis not present

## 2014-07-09 DIAGNOSIS — I10 Essential (primary) hypertension: Secondary | ICD-10-CM | POA: Insufficient documentation

## 2014-07-09 DIAGNOSIS — R51 Headache: Secondary | ICD-10-CM | POA: Insufficient documentation

## 2014-07-09 DIAGNOSIS — Z86711 Personal history of pulmonary embolism: Secondary | ICD-10-CM | POA: Insufficient documentation

## 2014-07-09 DIAGNOSIS — Z8701 Personal history of pneumonia (recurrent): Secondary | ICD-10-CM | POA: Insufficient documentation

## 2014-07-09 DIAGNOSIS — E785 Hyperlipidemia, unspecified: Secondary | ICD-10-CM | POA: Insufficient documentation

## 2014-07-09 DIAGNOSIS — F329 Major depressive disorder, single episode, unspecified: Secondary | ICD-10-CM | POA: Diagnosis not present

## 2014-07-09 DIAGNOSIS — Z7901 Long term (current) use of anticoagulants: Secondary | ICD-10-CM | POA: Insufficient documentation

## 2014-07-09 DIAGNOSIS — Z79899 Other long term (current) drug therapy: Secondary | ICD-10-CM | POA: Diagnosis not present

## 2014-07-09 DIAGNOSIS — E119 Type 2 diabetes mellitus without complications: Secondary | ICD-10-CM | POA: Insufficient documentation

## 2014-07-09 DIAGNOSIS — M109 Gout, unspecified: Secondary | ICD-10-CM | POA: Diagnosis not present

## 2014-07-09 DIAGNOSIS — F419 Anxiety disorder, unspecified: Secondary | ICD-10-CM | POA: Insufficient documentation

## 2014-07-09 DIAGNOSIS — Z862 Personal history of diseases of the blood and blood-forming organs and certain disorders involving the immune mechanism: Secondary | ICD-10-CM | POA: Insufficient documentation

## 2014-07-09 DIAGNOSIS — E663 Overweight: Secondary | ICD-10-CM | POA: Insufficient documentation

## 2014-07-09 DIAGNOSIS — R079 Chest pain, unspecified: Secondary | ICD-10-CM | POA: Diagnosis not present

## 2014-07-09 LAB — CBC
HCT: 39.3 % (ref 36.0–46.0)
Hemoglobin: 12.7 g/dL (ref 12.0–15.0)
MCH: 28.3 pg (ref 26.0–34.0)
MCHC: 32.3 g/dL (ref 30.0–36.0)
MCV: 87.7 fL (ref 78.0–100.0)
PLATELETS: 309 10*3/uL (ref 150–400)
RBC: 4.48 MIL/uL (ref 3.87–5.11)
RDW: 14.6 % (ref 11.5–15.5)
WBC: 7 10*3/uL (ref 4.0–10.5)

## 2014-07-09 LAB — BASIC METABOLIC PANEL
ANION GAP: 11 (ref 5–15)
BUN: 20 mg/dL (ref 6–20)
CALCIUM: 9.5 mg/dL (ref 8.9–10.3)
CHLORIDE: 102 mmol/L (ref 101–111)
CO2: 25 mmol/L (ref 22–32)
Creatinine, Ser: 1.13 mg/dL — ABNORMAL HIGH (ref 0.44–1.00)
GFR calc non Af Amer: 47 mL/min — ABNORMAL LOW (ref >60)
GFR, EST AFRICAN AMERICAN: 54 mL/min — AB (ref >60)
Glucose, Bld: 153 mg/dL — ABNORMAL HIGH (ref 65–99)
Potassium: 3.3 mmol/L — ABNORMAL LOW (ref 3.5–5.1)
SODIUM: 138 mmol/L (ref 135–145)

## 2014-07-09 LAB — TROPONIN I
Troponin I: <0.03 ng/mL (ref <0.031)
Troponin I: <0.03 ng/mL (ref <0.031)

## 2014-07-09 LAB — D-DIMER, QUANTITATIVE: D-Dimer, Quant: <0.27 ug/mL-FEU (ref 0.00–0.48)

## 2014-07-09 MED ORDER — POTASSIUM CHLORIDE CRYS ER 20 MEQ PO TBCR
20.0000 meq | EXTENDED_RELEASE_TABLET | Freq: Once | ORAL | Status: AC
  Administered 2014-07-09: 20 meq via ORAL
  Filled 2014-07-09: qty 1

## 2014-07-09 MED ORDER — FAMOTIDINE 20 MG PO TABS
20.0000 mg | ORAL_TABLET | Freq: Once | ORAL | Status: AC
  Administered 2014-07-09: 20 mg via ORAL
  Filled 2014-07-09: qty 1

## 2014-07-09 MED ORDER — LORAZEPAM 2 MG/ML IJ SOLN
0.5000 mg | Freq: Once | INTRAMUSCULAR | Status: AC
  Administered 2014-07-09: 0.5 mg via INTRAVENOUS
  Filled 2014-07-09: qty 1

## 2014-07-09 MED ORDER — GI COCKTAIL ~~LOC~~
30.0000 mL | Freq: Once | ORAL | Status: AC
  Administered 2014-07-09: 30 mL via ORAL
  Filled 2014-07-09: qty 30

## 2014-07-09 MED ORDER — TRAMADOL HCL 50 MG PO TABS
50.0000 mg | ORAL_TABLET | Freq: Once | ORAL | Status: AC
  Administered 2014-07-09: 50 mg via ORAL
  Filled 2014-07-09: qty 1

## 2014-07-09 NOTE — ED Notes (Signed)
EMS called to pt's house for c/o shortness of breath that developed in to chest pain. Pt took two nitro at home prior to EMS arriving. Pt has history of blood clots and stated that "it felt the same as then". EMS gave pt another Nitro for CP rated 10/10 en route. Pt currently denies chest pain and does not appear to have difficulty breathing.

## 2014-07-09 NOTE — Discharge Instructions (Signed)
It was our pleasure to provide your ER care today - we hope that you feel better.  Continue your current medication.  From today's lab tests, your potassium level is mildly low (3.3) - eat plenty of fruits and vegetables, and have level rechecked by your doctor in 1-2 weeks.  For chest discomfort, follow up with your doctor/cardiologist in the coming week - call office to arrange appointment.  Return to ER if worse, new symptoms, trouble breathing, fevers, recurrent/persistent chest pain, other concern.  You were given pain medication in the ER - no driving for the next 4 hours.     Chest Pain (Nonspecific) It is often hard to give a diagnosis for the cause of chest pain. There is always a chance that your pain could be related to something serious, such as a heart attack or a blood clot in the lungs. You need to follow up with your doctor. HOME CARE  If antibiotic medicine was given, take it as directed by your doctor. Finish the medicine even if you start to feel better.  For the next few days, avoid activities that bring on chest pain. Continue physical activities as told by your doctor.  Do not use any tobacco products. This includes cigarettes, chewing tobacco, and e-cigarettes.  Avoid drinking alcohol.  Only take medicine as told by your doctor.  Follow your doctor's suggestions for more testing if your chest pain does not go away.  Keep all doctor visits you made. GET HELP IF:  Your chest pain does not go away, even after treatment.  You have a rash with blisters on your chest.  You have a fever. GET HELP RIGHT AWAY IF:   You have more pain or pain that spreads to your arm, neck, jaw, back, or belly (abdomen).  You have shortness of breath.  You cough more than usual or cough up blood.  You have very bad back or belly pain.  You feel sick to your stomach (nauseous) or throw up (vomit).  You have very bad weakness.  You pass out (faint).  You have  chills. This is an emergency. Do not wait to see if the problems will go away. Call your local emergency services (911 in [place] Do not drive yourself to the hospital. MAKE SURE YOU:   Understand these instructions.  Will watch your condition.  Will get help right away if you are not doing well or get worse. Document Released: 07/21/2007 Document Revised: 02/06/2013 Document Reviewed: 07/21/2007 [facility] Patient Information 2015 [place], [place]. This information is not intended to replace advice given to you by your health care provider. Make sure you discuss any questions you have with your health care provider.    Hypokalemia Hypokalemia means that the amount of potassium in the blood is lower than normal.Potassium is a chemical, called an electrolyte, that helps regulate the amount of fluid in the body. It also stimulates muscle contraction and helps nerves function properly.Most of the body's potassium is inside of cells, and only a very small amount is in the blood. Because the amount in the blood is so small, minor changes can be life-threatening. CAUSES  Antibiotics.  Diarrhea or vomiting.  Using laxatives too much, which can cause diarrhea.  Chronic kidney disease.  Water pills (diuretics).  Eating disorders (bulimia).  Low magnesium level.  Sweating a lot. SIGNS AND SYMPTOMS  Weakness.  Constipation.  Fatigue.  Muscle cramps.  Mental confusion.  Skipped heartbeats or irregular heartbeat (palpitations).  Tingling or numbness.  DIAGNOSIS  Your health care provider can diagnose hypokalemia with blood tests. In addition to checking your potassium level, your health care provider may also check other lab tests. TREATMENT Hypokalemia can be treated with potassium supplements taken by mouth or adjustments in your current medicines. If your potassium level is very low, you may need to get potassium through a vein (IV) and be monitored in the hospital. A diet high  in potassium is also helpful. Foods high in potassium are:  Nuts, such as peanuts and pistachios.  Seeds, such as sunflower seeds and pumpkin seeds.  Peas, lentils, and lima beans.  Whole grain and bran cereals and breads.  Fresh fruit and vegetables, such as apricots, avocado, bananas, cantaloupe, kiwi, oranges, tomatoes, asparagus, and potatoes.  Orange and tomato juices.  Red meats.  Fruit yogurt. HOME CARE INSTRUCTIONS  Take all medicines as prescribed by your health care provider.  Maintain a healthy diet by including nutritious food, such as fruits, vegetables, nuts, whole grains, and lean meats.  If you are taking a laxative, be sure to follow the directions on the label. SEEK MEDICAL CARE IF:  Your weakness gets worse.  You feel your heart pounding or racing.  You are vomiting or having diarrhea.  You are diabetic and having trouble keeping your blood glucose in the normal range. SEEK IMMEDIATE MEDICAL CARE IF:  You have chest pain, shortness of breath, or dizziness.  You are vomiting or having diarrhea for more than 2 days.  You faint. MAKE SURE YOU:   Understand these instructions.  Will watch your condition.  Will get help right away if you are not doing well or get worse. Document Released: 02/01/2005 Document Revised: 11/22/2012 Document Reviewed: 08/04/2012 [facility] Patient Information 2015 [place], [place]. This information is not intended to replace advice given to you by your health care provider. Make sure you discuss any questions you have with your health care provider.

## 2014-07-09 NOTE — ED Provider Notes (Signed)
CSN: [id number]     Arrival date & time 07/09/14  1236 History   First MD Initiated Contact with Patient 07/09/14 1309     Chief Complaint  Patient presents with  . Chest Pain     (Consider location/radiation/quality/duration/timing/severity/associated sxs/prior Treatment) Patient is a 74 y.o. female presenting with chest pain. The history is provided by the patient.  Chest Pain Associated symptoms: headache   Associated symptoms: no abdominal pain, no back pain, no cough, no fever, no nausea, no numbness, no palpitations, no shortness of breath, not vomiting and no weakness   Patient c/o upper chest pain for the past day. Constant. Dull. Moderate. Not pleuritic. Occurs at rest. No specific exacerbating or alleviating factors. Is not pleuritic. No associated nv, diaphoresis, sob (although nursing note does indicate pt c/o sob earlier, currently denies having or having had), or unusual fatigue. Pt unsure if like prior cp (w previous PE).  Denies hx cad or fam hx cad. Denies cough or uri c/o. No fever or chills. No other recent exertional cp or discomfort. States compliant w normal meds, including pradaxa. No leg pain or swelling. No recent surgery, trauma, immobility or travel. ?hx gerd. Pts main c/o currently is frontal headache 'from ntg'. No severe headaches. No neck pain or stiffness. Headache mild-mod.      Past Medical History  Diagnosis Date  . Hyperlipidemia     takes Pravastatin daily  . Overweight(278.02)   . Depression   . Migraine   . Degenerative joint disease     Knees  . Glaucoma   . Pulmonary embolism May 2012    acute presentation, bilateral PE  . Chronic anticoagulation 07/21/2010  . Anemia, iron deficiency 2012    Evaluated by Dr. Oneida Alar; H&H of 9.3/30.8 with [place] in 10/2010; 3/3 positive Hemoccult cards in 07/2011  . Atrial fibrillation     takes Coumadin daily  . Hypertension     takes Hyzaar daily and Propranolol   . Pneumonia 1969    hx of  . Hx of  migraines     last one about a yr ago;takes Topamax daily  . Joint pain   . Joint swelling   . Chronic back pain     related to knee pain  . History of gout     was on medication but taken off of several months ago  . Bruises easily     takes Coumadin  . Skin spots-aging   . Gastroesophageal reflux disease     takes Omeprazole daily  . Hx of colonic polyps   . Urinary frequency   . Nocturia   . History of blood transfusion 1969  . Diabetes mellitus     takes Metformin daily  . Glaucoma     uses eye drops at night  . Anxiety     was on Prozac for a couple of weeks  . Insomnia     takes Restoril prn  . Memory loss     takes donepezil  . Migraine    Past Surgical History  Procedure Laterality Date  . Tonsillectomy    . Dilation and curettage of uterus    . Urethral dilation    . Knee arthroscopy w/ meniscectomy  90's    Left  . Colonoscopy  Jan 2002; 2012    2002: Dr. Tamala Julian, ext. hemorrhoids, nl colon; 2012-adenomatous polyps, gastritis on EGD  . Upper gastrointestinal endoscopy    . Orif ankle fracture Right 90's  . Givens capsule study  08/18/2011    Procedure: GIVENS CAPSULE STUDY;  Surgeon: Rogene Houston, MD;  Location: [facility];  Service: Endoscopy;  Laterality: N/A;  730  . Esophagogastroduodenoscopy  08/24/2011    ESOPHAGOGASTRODUODENOSCOPY; esophageal dilatation; Rogene Houston, MD;  . Total knee arthroplasty  10/20/2011    Procedure: TOTAL KNEE ARTHROPLASTY;  Surgeon: Ninetta Lights, MD;  Location: [facility];  Service: Orthopedics;  Laterality: Left;  . Cataract extraction w/phaco Right 04/11/2012    Procedure: CATARACT EXTRACTION PHACO AND INTRAOCULAR LENS PLACEMENT (IOC);  Surgeon: Elta Guadeloupe T. Gershon Crane, MD;  Location: [facility];  Service: Ophthalmology;  Laterality: Right;  CDE=9.61  . Cataract extraction w/phaco Left 12/26/2012    Procedure: CATARACT EXTRACTION PHACO AND INTRAOCULAR LENS PLACEMENT (IOC);  Surgeon: Elta Guadeloupe T. Gershon Crane, MD;  Location: [facility];  Service:  Ophthalmology;  Laterality: Left;  CDE:7.74   Family History  Problem Relation Age of Onset  . Diabetes Mother   . Hypertension Mother   . Arthritis Mother   . Heart failure Mother   . Leukemia Father   . Diabetes Sister   . Hypertension Sister   . Diabetes Brother   . Hypertension Brother   . Hypertension Brother   . Hypertension Brother   . Hypertension Brother   . Diabetes Brother   . Diabetes Brother   . Diabetes Brother   . Pulmonary embolism Sister   . Colon cancer Neg Hx   . Colon polyps Neg Hx   . Migraines Father   . Migraines Mother   . Migraines Sister   . Kidney failure Mother   . Kidney failure Brother   . Kidney failure Father    History  Substance Use Topics  . Smoking status: Never Smoker   . Smokeless tobacco: Never Used  . Alcohol Use: No   OB History    No data available     Review of Systems  Constitutional: Negative for fever and chills.  HENT: Negative for sore throat.   Eyes: Negative for pain and visual disturbance.  Respiratory: Negative for cough and shortness of breath.   Cardiovascular: Positive for chest pain. Negative for palpitations and leg swelling.  Gastrointestinal: Negative for nausea, vomiting and abdominal pain.  Endocrine: Negative for polyuria.  Genitourinary: Negative for flank pain.  Musculoskeletal: Negative for back pain and neck pain.  Skin: Negative for rash.  Neurological: Positive for headaches. Negative for weakness and numbness.  Hematological: Does not bruise/bleed easily.  Psychiatric/Behavioral: Negative for confusion.      Allergies  Penicillins; Buspar; Fentanyl; Sulfonamide derivatives; and Codeine  Home Medications   Prior to Admission medications   Medication Sig Start Date End Date Taking? Authorizing Provider  amLODipine (NORVASC) 10 MG tablet Take 1 tablet (10 mg total) by mouth daily. 03/27/14   [name], MD  atorvastatin (LIPITOR) 40 MG tablet Take 1 tablet (40 mg total) by mouth  daily. 05/14/14   Arnoldo Lenis, MD  butalbital-acetaminophen-caffeine (FIORICET, ESGIC) [phone] MG per tablet Take 1 tablet by mouth every 6 (six) hours as needed for headache. 07/02/14   Marcial Pacas, MD  dabigatran (PRADAXA) 150 MG CAPS capsule Take 1 capsule (150 mg total) by mouth 2 (two) times daily. 06/18/14   Arnoldo Lenis, MD  donepezil (ARICEPT) 10 MG tablet Take 1 tablet (10 mg total) by mouth at bedtime. 06/06/14   [name], MD  esomeprazole (NEXIUM) 40 MG capsule Take 40 mg by mouth daily at 12 noon.  Historical Provider, MD  FLUoxetine (PROZAC) 10 MG tablet Take 1 tablet (10 mg total) by mouth daily. 01/28/14   [name], MD  fluticasone Asencion Islam) 50 MCG/ACT nasal spray  04/11/13   Historical Provider, MD  imipramine (TOFRANIL) 50 MG tablet Take 2 tablets (100 mg total) by mouth at bedtime. 01/28/14   [name], MD  losartan-hydrochlorothiazide (HYZAAR) 100-12.5 MG per tablet Take 1 tablet by mouth daily. 03/27/14   [name], MD  meclizine (ANTIVERT) 25 MG tablet Take 25 mg by mouth 3 (three) times daily as needed for dizziness.    Historical Provider, MD  metFORMIN (GLUCOPHAGE) 500 MG tablet Take 1 tablet (500 mg total) by mouth 2 (two) times daily with a meal. 06/20/14   [name], MD  polyethylene glycol [facility] / Floria Raveling) packet Take 17 g by mouth daily as needed.    Historical Provider, MD  pravastatin (PRAVACHOL) 80 MG tablet Take 80 mg by mouth daily.    Historical Provider, MD  propranolol (INDERAL) 80 MG tablet Take 1 tablet (80 mg total) by mouth daily. 01/28/14   [name], MD  temazepam (RESTORIL) 30 MG capsule Take 1 capsule (30 mg total) by mouth at bedtime. 02/14/14   [name], MD  topiramate (TOPAMAX) 100 MG tablet Take 1 tablet (100 mg total) by mouth 2 (two) times daily. 02/14/14   [name], MD   BP 95/61 mmHg  Pulse 80  Temp(Src) 98.4 F (36.9 C) (Oral)  Resp 15  Ht 5\' 7"  (1.702 m)   Wt 213 lb (96.616 kg)  BMI 33.35 kg/m2  SpO2 85% Physical Exam  Constitutional: She is oriented to person, place, and time. She appears well-developed and well-nourished. No distress.  HENT:  No sinus or temporal tenderness.  Eyes: Conjunctivae are normal. Pupils are equal, round, and reactive to light. No scleral icterus.  Neck: Normal range of motion. Neck supple. No tracheal deviation present.  No stiffness or rigidity  Cardiovascular: Normal rate, regular rhythm, normal heart sounds and intact distal pulses.  Exam reveals no gallop and no friction rub.   No murmur heard. Pulmonary/Chest: Effort normal and breath sounds normal. No respiratory distress. She exhibits tenderness.  +chest wall tenderness, reproducing symptoms.   Abdominal: Soft. Normal appearance and bowel sounds are normal. She exhibits no distension. There is no tenderness.  Musculoskeletal: She exhibits no edema or tenderness.  Neurological: She is alert and oriented to person, place, and time.  Skin: Skin is warm and dry. No rash noted.  No shingles/rash in area of pain  Psychiatric: She has a normal mood and affect.  Nursing note and vitals reviewed.   ED Course  Procedures (including critical care time) Labs Review  Results for orders placed or performed during the hospital encounter of 07/09/14  CBC  Result Value Ref Range   WBC 7.0 4.0 - 10.5 K/uL   RBC 4.48 3.87 - 5.11 MIL/uL   Hemoglobin 12.7 12.0 - 15.0 g/dL   HCT 39.3 36.0 - 46.0 %   MCV 87.7 78.0 - 100.0 fL   MCH 28.3 26.0 - 34.0 pg   MCHC 32.3 30.0 - 36.0 g/dL   RDW 14.6 11.5 - 15.5 %   Platelets 309 150 - 400 K/uL  Basic metabolic panel  Result Value Ref Range   Sodium 138 135 - 145 mmol/L   Potassium 3.3 (L) 3.5 - 5.1 mmol/L   Chloride 102 101 - 111 mmol/L   CO2  25 22 - 32 mmol/L   Glucose, Bld 153 (H) 65 - 99 mg/dL   BUN 20 6 - 20 mg/dL   Creatinine, Ser 1.13 (H) 0.44 - 1.00 mg/dL   Calcium 9.5 8.9 - 10.3 mg/dL   GFR calc non Af Amer  47 (L) >60 mL/min   GFR calc Af Amer 54 (L) >60 mL/min   Anion gap 11 5 - 15  Troponin I  Result Value Ref Range   Troponin I <0.03 <0.031 ng/mL  D-dimer, quantitative  Result Value Ref Range   D-Dimer, Quant <0.27 0.00 - 0.48 ug/mL-FEU  Troponin I  Result Value Ref Range   Troponin I <0.03 <0.031 ng/mL   Dg Chest Port 1 View  07/09/2014   CLINICAL DATA:  Chest pain and shortness of breath since earlier today.  EXAM: PORTABLE CHEST - 1 VIEW  COMPARISON:  06/06/2014.  FINDINGS: Cardiomediastinal silhouette stable without significant cardiac enlargement. Tortuous calcified aorta. No infiltrates or failure. No effusion or pneumothorax. Negative osseous structures.  IMPRESSION: No active disease.  Stable chest.   Electronically Signed   By: Rolla Flatten M.D.   On: 07/09/2014 13:37        EKG Interpretation   Date/Time:  Tuesday Jul 09 2014 12:42:32 EDT Ventricular Rate:  81 PR Interval:  189 QRS Duration: 87 QT Interval:  521 QTC Calculation: 605 R Axis:   31 Text Interpretation:  Sinus arrhythmia Nonspecific T wave abnormality No  significant change since last tracing Baseline wander Confirmed by Ashok Cordia   MD, Lennette Bihari ([id number]) on 07/09/2014 1:11:10 PM      MDM   Iv ns. Continuous pulse ox and monitor. Labs. Cxr. Ecg.  Reviewed nursing notes and prior charts for additional history.   Pepcid, gi cocktail, and ultram for symptom relief.   Recheck, pt comfortable. No distress.  Recheck pt anxious/tearful.  Pt denies any new symptoms, pain or trouble breathing. Family indicates hx anxiety and requests med for same.  Ativan .5 mg iv.  Recheck pt calm and alert. Hungry.  Given po fluids and meal.  Recheck no c/o. No pain or sob.  After symptoms for past day, initial and delta troponin neg.  ddimer also normal.   Pt currently appears stable for d/c.   Return precautions provided.      Lajean Saver, MD 07/09/14 [id number]

## 2014-07-10 ENCOUNTER — Telehealth: Payer: Self-pay | Admitting: Family Medicine

## 2014-07-10 NOTE — Telephone Encounter (Signed)
Pls call pt/ [name], let her know I am aware she was inj the Ed with chest pain, she needs to call and see her heart Doc , was tghwe recommendation fopr follow up, pls ensure that they follow through on this

## 2014-07-11 NOTE — Telephone Encounter (Signed)
Called patient and left message for them to return call at the office   

## 2014-07-11 NOTE — Telephone Encounter (Signed)
Spoke with sister and she will make sure that patient gets follow up with cardio

## 2014-07-17 ENCOUNTER — Encounter: Payer: Self-pay | Admitting: Cardiology

## 2014-07-17 ENCOUNTER — Ambulatory Visit (INDEPENDENT_AMBULATORY_CARE_PROVIDER_SITE_OTHER): Payer: Commercial Managed Care - HMO | Admitting: Cardiology

## 2014-07-17 VITALS — BP 130/74 | HR 72 | Ht 67.0 in | Wt 220.8 lb

## 2014-07-17 DIAGNOSIS — I10 Essential (primary) hypertension: Secondary | ICD-10-CM

## 2014-07-17 DIAGNOSIS — Z7901 Long term (current) use of anticoagulants: Secondary | ICD-10-CM | POA: Diagnosis not present

## 2014-07-17 DIAGNOSIS — I2699 Other pulmonary embolism without acute cor pulmonale: Secondary | ICD-10-CM

## 2014-07-17 DIAGNOSIS — R0789 Other chest pain: Secondary | ICD-10-CM | POA: Diagnosis not present

## 2014-07-17 MED ORDER — DABIGATRAN ETEXILATE MESYLATE 150 MG PO CAPS: 150.0000 mg | ORAL_CAPSULE | Freq: Two times a day (BID) | ORAL | Status: DC

## 2014-07-17 NOTE — Progress Notes (Signed)
Clinical Summary [name] is a 74 y.o.female seen today for follow up of the following medical problems.   1. History of PE - occurred in 06/2010, per notes appeared to be unprovoked. Has been on chronic anticoagulation since. Denies any problems with bleeding.  - on chronic anticoagulation - INRs were labile on coumadin, switched to xarelto.  - reports xarelto caused diffuse numbness, changed to pradaxa which she is tolerating well - denies any bleeding issues on pradaxa.   2. HTN - compliant with meds  3. Hyperlipidemia - 11/2013 TC 161 TG 162 HDL 60 LDL 69 - compliant with statin  4. Chest pain - seen in ER 07/09/14 with chest pain - from notes pain was reproducible on exam, - CXR no acute process, EKG no ischemic changes, trop neg, D-dimer negative.  - chest pain started at rest while sitting at table. Pain left neck into chest, sharp pain. Noted to have some SOB. 10/10 pain. Worst with movement and deep breath. Severe pain lasted 10-15 minutes, then soreness lasted several hours after. Still with some soreness today.    Past Medical History  Diagnosis Date  . Hyperlipidemia     takes Pravastatin daily  . Overweight(278.02)   . Depression   . Migraine   . Degenerative joint disease     Knees  . Glaucoma   . Pulmonary embolism May 2012    acute presentation, bilateral PE  . Chronic anticoagulation 07/21/2010  . Anemia, iron deficiency 2012    Evaluated by Dr. Oneida Alar; H&H of 9.3/30.8 with [facility] in 10/2010; 3/3 positive Hemoccult cards in 07/2011  . Atrial fibrillation     takes Coumadin daily  . Hypertension     takes Hyzaar daily and Propranolol   . Pneumonia 1969    hx of  . Hx of migraines     last one about a yr ago;takes Topamax daily  . Joint pain   . Joint swelling   . Chronic back pain     related to knee pain  . History of gout     was on medication but taken off of several months ago  . Bruises easily     takes Coumadin  . Skin spots-aging     . Gastroesophageal reflux disease     takes Omeprazole daily  . Hx of colonic polyps   . Urinary frequency   . Nocturia   . History of blood transfusion 1969  . Diabetes mellitus     takes Metformin daily  . Glaucoma     uses eye drops at night  . Anxiety     was on Prozac for a couple of weeks  . Insomnia     takes Restoril prn  . Memory loss     takes donepezil  . Migraine      Allergies  Allergen Reactions  . Penicillins Other (See Comments)    Itching  . Buspar [Buspirone] Itching  . Fentanyl Itching    Patient just started the patch on 08-08-2011 and she has noted itching, but states that it is helping.  . Sulfonamide Derivatives Hives  . Codeine Itching and Rash     Current Outpatient Prescriptions  Medication Sig Dispense Refill  . amLODipine (NORVASC) 10 MG tablet Take 1 tablet (10 mg total) by mouth daily. 90 tablet 0  . atorvastatin (LIPITOR) 40 MG tablet Take 1 tablet (40 mg total) by mouth daily. 90 tablet 3  . butalbital-acetaminophen-caffeine (FIORICET, ESGIC) 50-325-40 MG  per tablet Take 1 tablet by mouth every 6 (six) hours as needed for headache. 12 tablet 3  . dabigatran (PRADAXA) 150 MG CAPS capsule Take 1 capsule (150 mg total) by mouth 2 (two) times daily. 180 capsule 3  . donepezil (ARICEPT) 10 MG tablet Take 1 tablet (10 mg total) by mouth at bedtime. 90 tablet 1  . esomeprazole (NEXIUM) 40 MG capsule Take 40 mg by mouth daily at 12 noon.    Marland Kitchen FLUoxetine (PROZAC) 10 MG tablet Take 1 tablet (10 mg total) by mouth daily. 90 tablet 1  . fluticasone (FLONASE) 50 MCG/ACT nasal spray     . imipramine (TOFRANIL) 50 MG tablet Take 2 tablets (100 mg total) by mouth at bedtime. 180 tablet 1  . losartan-hydrochlorothiazide (HYZAAR) 100-12.5 MG per tablet Take 1 tablet by mouth daily. 90 tablet 0  . meclizine (ANTIVERT) 25 MG tablet Take 25 mg by mouth 3 (three) times daily as needed for dizziness.    . metFORMIN (GLUCOPHAGE) 500 MG tablet Take 1 tablet (500  mg total) by mouth 2 (two) times daily with a meal. 180 tablet 0  . polyethylene glycol (MIRALAX / GLYCOLAX) packet Take 17 g by mouth daily as needed.    . pravastatin (PRAVACHOL) 80 MG tablet Take 80 mg by mouth daily.    . propranolol (INDERAL) 80 MG tablet Take 1 tablet (80 mg total) by mouth daily. 90 tablet 1  . temazepam (RESTORIL) 30 MG capsule Take 1 capsule (30 mg total) by mouth at bedtime. 90 capsule 1  . topiramate (TOPAMAX) 100 MG tablet Take 1 tablet (100 mg total) by mouth 2 (two) times daily. 180 tablet 0   No current facility-administered medications for this visit.   Facility-Administered Medications Ordered in Other Visits  Medication Dose Route Frequency Provider Last Rate Last Dose  . fentaNYL (SUBLIMAZE) injection 25-50 mcg  25-50 mcg Intravenous Q5 min PRN Lerry Liner, MD         Past Surgical History  Procedure Laterality Date  . Tonsillectomy    . Dilation and curettage of uterus    . Urethral dilation    . Knee arthroscopy w/ meniscectomy  90's    Left  . Colonoscopy  Jan 2002; 2012    2002: Dr. Tamala Julian, ext. hemorrhoids, nl colon; 2012-adenomatous polyps, gastritis on EGD  . Upper gastrointestinal endoscopy    . Orif ankle fracture Right 90's  . Givens capsule study  08/18/2011    Procedure: GIVENS CAPSULE STUDY;  Surgeon: Rogene Houston, MD;  Location: [facility];  Service: Endoscopy;  Laterality: N/A;  730  . Esophagogastroduodenoscopy  08/24/2011    ESOPHAGOGASTRODUODENOSCOPY; esophageal dilatation; Rogene Houston, MD;  . Total knee arthroplasty  10/20/2011    Procedure: TOTAL KNEE ARTHROPLASTY;  Surgeon: Ninetta Lights, MD;  Location: [facility];  Service: Orthopedics;  Laterality: Left;  . Cataract extraction w/phaco Right 04/11/2012    Procedure: CATARACT EXTRACTION PHACO AND INTRAOCULAR LENS PLACEMENT (IOC);  Surgeon: Elta Guadeloupe T. Gershon Crane, MD;  Location: [facility];  Service: Ophthalmology;  Laterality: Right;  CDE=9.61  . Cataract extraction w/phaco Left  12/26/2012    Procedure: CATARACT EXTRACTION PHACO AND INTRAOCULAR LENS PLACEMENT (IOC);  Surgeon: Elta Guadeloupe T. Gershon Crane, MD;  Location: [facility];  Service: Ophthalmology;  Laterality: Left;  CDE:7.74     Allergies  Allergen Reactions  . Penicillins Other (See Comments)    Itching  . Buspar [Buspirone] Itching  . Fentanyl Itching    Patient just  started the patch on 08-08-2011 and she has noted itching, but states that it is helping.  . Sulfonamide Derivatives Hives  . Codeine Itching and Rash      Family History  Problem Relation Age of Onset  . Diabetes Mother   . Hypertension Mother   . Arthritis Mother   . Heart failure Mother   . Leukemia Father   . Diabetes Sister   . Hypertension Sister   . Diabetes Brother   . Hypertension Brother   . Hypertension Brother   . Hypertension Brother   . Hypertension Brother   . Diabetes Brother   . Diabetes Brother   . Diabetes Brother   . Pulmonary embolism Sister   . Colon cancer Neg Hx   . Colon polyps Neg Hx   . Migraines Father   . Migraines Mother   . Migraines Sister   . Kidney failure Mother   . Kidney failure Brother   . Kidney failure Father      Social History Ms. Tupper reports that she has never smoked. She has never used smokeless tobacco. Ms. Meisenheimer reports that she does not drink alcohol.   Review of Systems CONSTITUTIONAL: No weight loss, fever, chills, weakness or fatigue.  HEENT: Eyes: No visual loss, blurred vision, double vision or yellow sclerae.No hearing loss, sneezing, congestion, runny nose or sore throat.  SKIN: No rash or itching.  CARDIOVASCULAR: per HPI RESPIRATORY: No shortness of breath, cough or sputum.  GASTROINTESTINAL: No anorexia, nausea, vomiting or diarrhea. No abdominal pain or blood.  GENITOURINARY: No burning on urination, no polyuria NEUROLOGICAL: No headache, dizziness, syncope, paralysis, ataxia, numbness or tingling in the extremities. No change in bowel or bladder control.    MUSCULOSKELETAL: No muscle, back pain, joint pain or stiffness.  LYMPHATICS: No enlarged nodes. No history of splenectomy.  PSYCHIATRIC: No history of depression or anxiety.  ENDOCRINOLOGIC: No reports of sweating, cold or heat intolerance. No polyuria or polydipsia.  Marland Kitchen   Physical Examination Filed Vitals:   07/17/14 1636  BP: 130/74  Pulse: 72   Filed Vitals:   07/17/14 1636  Height: 5\' 7"  (1.702 m)  Weight: 220 lb 12.8 oz (100.154 kg)    Gen: resting comfortably, no acute distress HEENT: no scleral icterus, pupils equal round and reactive, no palptable cervical adenopathy,  CV: RRR, no m/r/, no jvd Resp: Clear to auscultation bilaterally GI: abdomen is soft, non-tender, non-distended, normal bowel sounds, no hepatosplenomegaly MSK: extremities are warm, no edema.  Skin: warm, no rash Neuro:  no focal deficits Psych: appropriate affect   Diagnostic Studies     Assessment and Plan   1. 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 HTN - at goal, continue current meds  3. Hyperlipidemia - continue statin  4. Chest pain - reproducible on exam, negative evaluation in ER. Appears to be muskuloskeletal. No further workup at this time.      F/u 6 months Arnoldo Lenis, M.D.,

## 2014-07-17 NOTE — Patient Instructions (Signed)
Your physician wants you to follow-up in:  6 months with Bunnie Domino . You will receive a reminder letter in the mail two months in advance. If you don't receive a letter, please call our office to schedule the follow-up appointment.  Your physician recommends that you continue on your current medications as directed. Please refer to the Current Medication list given to you today.  We have sent a 30 day supply of PRADAXA TO [facility]  Thanks for choosing [facility]!!!

## 2014-08-07 ENCOUNTER — Ambulatory Visit: Payer: Commercial Managed Care - HMO | Admitting: Neurology

## 2014-08-07 ENCOUNTER — Telehealth: Payer: Self-pay | Admitting: *Deleted

## 2014-08-07 NOTE — Telephone Encounter (Signed)
Called to cancel the morning of her appt.

## 2014-08-15 ENCOUNTER — Telehealth: Payer: Self-pay | Admitting: *Deleted

## 2014-08-15 ENCOUNTER — Other Ambulatory Visit: Payer: Self-pay

## 2014-08-15 ENCOUNTER — Telehealth: Payer: Self-pay

## 2014-08-15 DIAGNOSIS — R413 Other amnesia: Secondary | ICD-10-CM

## 2014-08-15 MED ORDER — DONEPEZIL HCL 10 MG PO TABS
10.0000 mg | ORAL_TABLET | Freq: Every day | ORAL | Status: DC
Start: 2014-08-15 — End: 2014-09-03

## 2014-08-15 NOTE — Telephone Encounter (Signed)
[name] patient's sister is returning a call. She states the patient does not want to have botox.

## 2014-08-15 NOTE — Telephone Encounter (Signed)
LVM for patient to call office to discuss Botox.

## 2014-08-15 NOTE — Telephone Encounter (Signed)
Med increased 4/21.  Will resend medication to [facility]

## 2014-08-15 NOTE — Telephone Encounter (Signed)
Pt called stating she needs her memory refilled and Dr. Moshe Cipro is supposed to raise the dosage on them, pt states she [facility] the [facility]. Please  Advise [phone]

## 2014-08-21 ENCOUNTER — Encounter: Payer: Self-pay | Admitting: Family Medicine

## 2014-08-21 ENCOUNTER — Other Ambulatory Visit: Payer: Self-pay | Admitting: Family Medicine

## 2014-08-21 DIAGNOSIS — Z1231 Encounter for screening mammogram for malignant neoplasm of breast: Secondary | ICD-10-CM

## 2014-08-21 NOTE — Assessment & Plan Note (Signed)
The increased risk of cardiovascular disease associated with this diagnosis, and the need to consistently work on lifestyle to change this is discussed. Following  a  heart healthy diet ,commitment to 30 minutes of exercise at least 5 days per week, as well as control of blood sugar and cholesterol , and achieving a healthy weight are all the areas to be addressed .  

## 2014-08-21 NOTE — Assessment & Plan Note (Signed)
Controlled, no change in medication DASH diet and commitment to daily physical activity for a minimum of 30 minutes discussed and encouraged, as a part of hypertension management. The importance of attaining a healthy weight is also discussed.  BP/Weight 07/17/2014 07/09/2014 07/02/2014 06/06/2014 05/14/2014 04/23/2014 41/03/8206  Systolic BP 138 871 - 959 747 185 501  Diastolic BP 74 87 - 74 82 84 80  Wt. (Lbs) 220.8 213 223 224.12 227.6 227 216.08  BMI 34.57 33.35 37.11 37.3 35.64 37.77 35.96

## 2014-08-21 NOTE — Assessment & Plan Note (Signed)
[name] is reminded of the importance of commitment to daily physical activity for 30 minutes or more, as able and the need to limit carbohydrate intake to 30 to 60 grams per meal to help with blood sugar control.   The need to take medication as prescribed, test blood sugar as directed, and to call between visits if there is a concern that blood sugar is uncontrolled is also discussed.   [name] is reminded of the importance of daily foot exam, annual eye examination, and good blood sugar, blood pressure and cholesterol control. After visit lab result available, deterioration in control, med adjustment made  Diabetic Labs Latest Ref Rng 07/09/2014 06/06/2014 11/15/2013 04/19/2013 12/22/2012  HbA1c <5.7 % - 7.8(H) 6.8(H) 6.7(H) -  Microalbumin <2.0 mg/dL - - 12.0(H) - -  Micro/Creat Ratio 0.0 - 30.0 mg/g - - 29.8 - -  Chol 0 - 200 mg/dL - - 161 164 -  HDL >39 mg/dL - - 60 68 -  Calc LDL 0 - 99 mg/dL - - 69 70 -  Triglycerides <150 mg/dL - - 162(H) 128 -  Creatinine 0.44 - 1.00 mg/dL 1.13(H) 0.87 1.17(H) 1.13(H) 1.24(H)   BP/Weight 07/17/2014 07/09/2014 07/02/2014 06/06/2014 05/14/2014 04/23/2014 40/10/7351  Systolic BP 299 242 - 683 419 622 297  Diastolic BP 74 87 - 74 82 84 80  Wt. (Lbs) 220.8 213 223 224.12 227.6 227 216.08  BMI 34.57 33.35 37.11 37.3 35.64 37.77 35.96   Foot/eye exam completion dates 11/15/2013 01/21/2009  Foot exam Order - yes  Foot Form Completion Done -

## 2014-08-21 NOTE — Assessment & Plan Note (Signed)
Dose increase in aricept to treating dose

## 2014-08-21 NOTE — Assessment & Plan Note (Signed)
Uncontrolled, start zoloft, not suicidal or homicidal

## 2014-08-21 NOTE — Assessment & Plan Note (Addendum)
Hyperlipidemia:Low fat diet discussed and encouraged.   Lipid Panel  Lab Results  Component Value Date   CHOL 161 11/15/2013   HDL 60 11/15/2013   LDLCALC 69 11/15/2013   TRIG 162* 11/15/2013   CHOLHDL 2.7 11/15/2013   Needs to reduce fatty foods , no med change  Updated lab due

## 2014-08-21 NOTE — Assessment & Plan Note (Signed)
Deteriorated. Patient re-educated about  the importance of commitment to a  minimum of 150 minutes of exercise per week.  The importance of healthy food choices with portion control discussed. Encouraged to start a food diary, count calories and to consider  joining a support group. Sample diet sheets offered. Goals set by the patient for the next several months.   Weight /BMI 07/17/2014 07/09/2014 07/02/2014  WEIGHT 220 lb 12.8 oz 213 lb 223 lb  HEIGHT 5\' 7"  5\' 7"  5\' 5"   BMI 34.57 kg/m2 33.35 kg/m2 37.11 kg/m2    Current exercise per week 30 minutes.

## 2014-08-21 NOTE — Assessment & Plan Note (Signed)
Uncontrolled, still having persistent headaches, she is referred to neurology for evaluation and management

## 2014-08-21 NOTE — Assessment & Plan Note (Signed)
Worsened pain and instability refer to ortho for eval and management

## 2014-08-29 ENCOUNTER — Telehealth: Payer: Self-pay | Admitting: *Deleted

## 2014-08-29 NOTE — Telephone Encounter (Signed)
Pt called stating the blood pressure pill Dr. Moshe Cipro has her on is making her real forgetful pt would like to change it, and pt also wants a refill on Topamax to [facility]

## 2014-08-30 ENCOUNTER — Other Ambulatory Visit: Payer: Self-pay

## 2014-08-30 MED ORDER — TOPIRAMATE 100 MG PO TABS: 100.0000 mg | ORAL_TABLET | Freq: Two times a day (BID) | ORAL | Status: DC

## 2014-08-30 NOTE — Telephone Encounter (Signed)
Topamax refilled.  

## 2014-08-30 NOTE — Telephone Encounter (Signed)
Concern will be addressed at visit.

## 2014-09-02 ENCOUNTER — Telehealth: Payer: Self-pay | Admitting: *Deleted

## 2014-09-02 NOTE — Telephone Encounter (Signed)
Noted and note made in chart

## 2014-09-02 NOTE — Telephone Encounter (Signed)
Pt called stating she does not want [name] to have any information on her any more per pt to never discuss anything with [name] she want's her off of her list, it is okay to discuss for information with her son [name] and her daughter Olin Hauser, pt states [name] is telling her business to everyone, pt wants everyone here at the office to be aware.

## 2014-09-02 NOTE — Telephone Encounter (Signed)
NOTED

## 2014-09-03 ENCOUNTER — Telehealth: Payer: Self-pay | Admitting: *Deleted

## 2014-09-03 ENCOUNTER — Ambulatory Visit (INDEPENDENT_AMBULATORY_CARE_PROVIDER_SITE_OTHER): Payer: Commercial Managed Care - HMO | Admitting: Family Medicine

## 2014-09-03 ENCOUNTER — Encounter: Payer: Self-pay | Admitting: Family Medicine

## 2014-09-03 VITALS — BP 128/78 | HR 78 | Resp 16 | Ht 67.0 in | Wt 218.0 lb

## 2014-09-03 DIAGNOSIS — R42 Dizziness and giddiness: Secondary | ICD-10-CM

## 2014-09-03 DIAGNOSIS — N3000 Acute cystitis without hematuria: Secondary | ICD-10-CM | POA: Diagnosis not present

## 2014-09-03 DIAGNOSIS — R110 Nausea: Secondary | ICD-10-CM | POA: Diagnosis not present

## 2014-09-03 DIAGNOSIS — I10 Essential (primary) hypertension: Secondary | ICD-10-CM

## 2014-09-03 DIAGNOSIS — R51 Headache: Secondary | ICD-10-CM | POA: Diagnosis not present

## 2014-09-03 DIAGNOSIS — E119 Type 2 diabetes mellitus without complications: Secondary | ICD-10-CM | POA: Diagnosis not present

## 2014-09-03 DIAGNOSIS — R413 Other amnesia: Secondary | ICD-10-CM | POA: Diagnosis not present

## 2014-09-03 DIAGNOSIS — E785 Hyperlipidemia, unspecified: Secondary | ICD-10-CM | POA: Diagnosis not present

## 2014-09-03 DIAGNOSIS — F418 Other specified anxiety disorders: Secondary | ICD-10-CM

## 2014-09-03 DIAGNOSIS — R519 Headache, unspecified: Secondary | ICD-10-CM

## 2014-09-03 MED ORDER — ONDANSETRON HCL 4 MG/2ML IJ SOLN
4.0000 mg | Freq: Once | INTRAMUSCULAR | Status: AC
  Administered 2014-09-03: 4 mg via INTRAMUSCULAR

## 2014-09-03 MED ORDER — ONDANSETRON HCL 4 MG PO TABS: ORAL_TABLET | ORAL | Status: DC

## 2014-09-03 MED ORDER — RAMIPRIL 2.5 MG PO CAPS: 2.5000 mg | ORAL_CAPSULE | Freq: Every day | ORAL | Status: DC

## 2014-09-03 MED ORDER — KETOROLAC TROMETHAMINE 60 MG/2ML IM SOLN
60.0000 mg | Freq: Once | INTRAMUSCULAR | Status: AC
  Administered 2014-09-03: 60 mg via INTRAMUSCULAR

## 2014-09-03 MED ORDER — ESOMEPRAZOLE MAGNESIUM 40 MG PO CPDR: 40.0000 mg | DELAYED_RELEASE_CAPSULE | Freq: Every day | ORAL | Status: DC

## 2014-09-03 MED ORDER — POLYETHYLENE GLYCOL 3350 17 G PO PACK: 17.0000 g | PACK | Freq: Every day | ORAL | Status: DC | PRN

## 2014-09-03 MED ORDER — TEMAZEPAM 30 MG PO CAPS: 30.0000 mg | ORAL_CAPSULE | Freq: Every day | ORAL | Status: DC

## 2014-09-03 MED ORDER — DONEPEZIL HCL 10 MG PO TABS: 10.0000 mg | ORAL_TABLET | Freq: Every day | ORAL | Status: DC

## 2014-09-03 MED ORDER — TOPIRAMATE 100 MG PO TABS
100.0000 mg | ORAL_TABLET | Freq: Two times a day (BID) | ORAL | Status: DC
Start: 2014-09-03 — End: 2015-02-24

## 2014-09-03 MED ORDER — IMIPRAMINE HCL 50 MG PO TABS: 100.0000 mg | ORAL_TABLET | Freq: Every day | ORAL | Status: DC

## 2014-09-03 MED ORDER — MECLIZINE HCL 25 MG PO TABS: 25.0000 mg | ORAL_TABLET | Freq: Three times a day (TID) | ORAL | Status: DC | PRN

## 2014-09-03 NOTE — Patient Instructions (Signed)
Annual physical exam  in 3 month, call if you need me before  Labs today, including microalb and UA/ and c/s  Toradol 60mg  IM in office and zofran 4mg  IM for headache and nausea  Imitrex spray will be sent for headaches  For anxiety , take buspar one tablet 3 times daily,  For stomach and GI symptoms nexium is sent in also mirilax  Pls call if not doing better with medication

## 2014-09-03 NOTE — Telephone Encounter (Signed)
Noted  

## 2014-09-03 NOTE — Telephone Encounter (Signed)
Pt called stating to give [name] information that is her sister and she needs information on her [phone] pt stated her and [name] talked about everything and [name] is bringing her to her appt this morning.

## 2014-09-04 LAB — COMPLETE METABOLIC PANEL WITH GFR
ALK PHOS: 99 U/L (ref 39–117)
ALT: 14 U/L (ref 0–35)
AST: 11 U/L (ref 0–37)
Albumin: 3.9 g/dL (ref 3.5–5.2)
BILIRUBIN TOTAL: 0.3 mg/dL (ref 0.2–1.2)
BUN: 13 mg/dL (ref 6–23)
CALCIUM: 9.7 mg/dL (ref 8.4–10.5)
CHLORIDE: 103 meq/L (ref 96–112)
CO2: 28 meq/L (ref 19–32)
CREATININE: 0.97 mg/dL (ref 0.50–1.10)
GFR, Est African American: 67 mL/min
GFR, Est Non African American: 58 mL/min — ABNORMAL LOW
GLUCOSE: 123 mg/dL — AB (ref 70–99)
POTASSIUM: 4.3 meq/L (ref 3.5–5.3)
SODIUM: 139 meq/L (ref 135–145)
Total Protein: 7.3 g/dL (ref 6.0–8.3)

## 2014-09-04 LAB — LIPID PANEL
CHOL/HDL RATIO: 2.4 ratio
CHOLESTEROL: 145 mg/dL (ref 0–200)
HDL: 61 mg/dL (ref >=46)
LDL CALC: 56 mg/dL (ref 0–99)
Triglycerides: 139 mg/dL (ref <150)
VLDL: 28 mg/dL (ref 0–40)

## 2014-09-04 LAB — MICROALBUMIN / CREATININE URINE RATIO
Creatinine, Urine: 277.5 mg/dL
MICROALB UR: 4.5 mg/dL — AB (ref <2.0)
Microalb Creat Ratio: 16.2 mg/g (ref 0.0–30.0)

## 2014-09-04 LAB — HEMOGLOBIN A1C
Hgb A1c MFr Bld: 7.5 % — ABNORMAL HIGH (ref <5.7)
MEAN PLASMA GLUCOSE: 169 mg/dL — AB (ref <117)

## 2014-09-06 ENCOUNTER — Other Ambulatory Visit: Payer: Self-pay | Admitting: Family Medicine

## 2014-09-06 ENCOUNTER — Other Ambulatory Visit: Payer: Self-pay

## 2014-09-06 LAB — URINE CULTURE: Colony Count: >=100000

## 2014-09-06 MED ORDER — NITROFURANTOIN MACROCRYSTAL 100 MG PO CAPS: 100.0000 mg | ORAL_CAPSULE | Freq: Four times a day (QID) | ORAL | Status: DC

## 2014-09-20 ENCOUNTER — Telehealth: Payer: Self-pay | Admitting: *Deleted

## 2014-09-20 ENCOUNTER — Other Ambulatory Visit: Payer: Self-pay

## 2014-09-20 MED ORDER — MECLIZINE HCL 25 MG PO TABS: 25.0000 mg | ORAL_TABLET | Freq: Three times a day (TID) | ORAL | Status: DC | PRN

## 2014-09-20 MED ORDER — OMEPRAZOLE 40 MG PO CPDR: 40.0000 mg | DELAYED_RELEASE_CAPSULE | Freq: Every day | ORAL | Status: DC

## 2014-09-20 NOTE — Telephone Encounter (Signed)
Wants to change from nexium to omeprazole due to cost reasons. Ok to change?

## 2014-09-20 NOTE — Telephone Encounter (Signed)
Pt's sister called stating pt has a copay with nexium and it is high, pt wants to switch omethadal in a 90 day supply. Rx for dizziness she would like a 90 day supply because the copay is 30$. Call sister [phone]

## 2014-09-20 NOTE — Telephone Encounter (Signed)
Done

## 2014-09-20 NOTE — Telephone Encounter (Signed)
Change to ompeprazole 40 mg 1 daily pls and d/c other

## 2014-09-23 ENCOUNTER — Ambulatory Visit (HOSPITAL_COMMUNITY): Payer: Self-pay

## 2014-10-02 ENCOUNTER — Telehealth: Payer: Self-pay | Admitting: *Deleted

## 2014-10-02 NOTE — Telephone Encounter (Signed)
Called and left message for patient to return call.  

## 2014-10-02 NOTE — Telephone Encounter (Signed)
Pt called [facility] during lunch stating she wants the nurse to call her ASAP. [phone]

## 2014-10-05 ENCOUNTER — Telehealth: Payer: Self-pay | Admitting: Family Medicine

## 2014-10-05 MED ORDER — CIPROFLOXACIN HCL 500 MG PO TABS: 500.0000 mg | ORAL_TABLET | Freq: Two times a day (BID) | ORAL | Status: DC

## 2014-10-05 NOTE — Telephone Encounter (Signed)
2 day h/o dysuria and frequency, denies flank pain or fever Wants to know why she  Keeps having UTI's will look further into this at next visit, has seen urology in the past Advised if she is still symptomatic on Monday , she is to call so that urine c/s may be submitted Cipro # 6 prescribed

## 2014-10-17 ENCOUNTER — Telehealth: Payer: Self-pay | Admitting: *Deleted

## 2014-10-17 NOTE — Telephone Encounter (Signed)
Clarification sent

## 2014-10-17 NOTE — Telephone Encounter (Signed)
[employer] called [facility]mom requesting a refill on a medication for patient. [place] # [phone] Fax # [phone]

## 2014-10-30 ENCOUNTER — Telehealth: Payer: Self-pay

## 2014-10-30 NOTE — Telephone Encounter (Signed)
Pl call and give work in for am

## 2014-10-30 NOTE — Telephone Encounter (Signed)
C/o bad cough going on x 1 week, getting worse. Coughing so hard she wets herself. Unable to produce phlegm to see what color it is but feels like its stuck in her chest. Chills and bodyaches and has been laying in bed feeling bad she states. Wants an antibiotic and cough syrup called in to her pharmacy or wants to be seen tomorrow. Please advise

## 2014-10-31 ENCOUNTER — Encounter: Payer: Self-pay | Admitting: Family Medicine

## 2014-10-31 ENCOUNTER — Ambulatory Visit (INDEPENDENT_AMBULATORY_CARE_PROVIDER_SITE_OTHER): Payer: Commercial Managed Care - HMO | Admitting: Family Medicine

## 2014-10-31 VITALS — BP 152/86 | HR 70 | Temp 98.6°F | Resp 18 | Ht 67.0 in | Wt 224.0 lb

## 2014-10-31 DIAGNOSIS — R51 Headache: Secondary | ICD-10-CM | POA: Diagnosis not present

## 2014-10-31 DIAGNOSIS — I10 Essential (primary) hypertension: Secondary | ICD-10-CM

## 2014-10-31 DIAGNOSIS — J018 Other acute sinusitis: Secondary | ICD-10-CM

## 2014-10-31 DIAGNOSIS — E119 Type 2 diabetes mellitus without complications: Secondary | ICD-10-CM | POA: Diagnosis not present

## 2014-10-31 DIAGNOSIS — J42 Unspecified chronic bronchitis: Secondary | ICD-10-CM | POA: Insufficient documentation

## 2014-10-31 DIAGNOSIS — R519 Headache, unspecified: Secondary | ICD-10-CM

## 2014-10-31 DIAGNOSIS — J209 Acute bronchitis, unspecified: Secondary | ICD-10-CM | POA: Diagnosis not present

## 2014-10-31 MED ORDER — IPRATROPIUM BROMIDE 0.02 % IN SOLN
0.5000 mg | Freq: Once | RESPIRATORY_TRACT | Status: AC
  Administered 2014-10-31: 0.5 mg via RESPIRATORY_TRACT

## 2014-10-31 MED ORDER — ALBUTEROL SULFATE HFA 108 (90 BASE) MCG/ACT IN AERS: 2.0000 | INHALATION_SPRAY | Freq: Four times a day (QID) | RESPIRATORY_TRACT | Status: DC | PRN

## 2014-10-31 MED ORDER — PROMETHAZINE-DM 6.25-15 MG/5ML PO SYRP: 5.0000 mL | ORAL_SOLUTION | Freq: Every evening | ORAL | Status: DC | PRN

## 2014-10-31 MED ORDER — DOXYCYCLINE HYCLATE 100 MG PO TABS: 100.0000 mg | ORAL_TABLET | Freq: Two times a day (BID) | ORAL | Status: DC

## 2014-10-31 MED ORDER — BENZONATATE 100 MG PO CAPS: 100.0000 mg | ORAL_CAPSULE | Freq: Two times a day (BID) | ORAL | Status: DC | PRN

## 2014-10-31 MED ORDER — ALBUTEROL SULFATE (2.5 MG/3ML) 0.083% IN NEBU
2.5000 mg | INHALATION_SOLUTION | Freq: Once | RESPIRATORY_TRACT | Status: AC
  Administered 2014-10-31: 2.5 mg via RESPIRATORY_TRACT

## 2014-10-31 NOTE — Progress Notes (Signed)
   Subjective:    Patient ID: [name], female    DOB: 01-24-1941, 74 y.o.   MRN: [id number]  HPI 2 week h/o worsening head and chest congestion, associated with fever and chills intermittently. Nasal drainage has thickened , and is yellowish green, and at times bloody. Sputum is thick and yellow. C/o bilateral ear pressure, denies hearing loss and sore throat. Increasing fatigue , poor appetitie and sleep disturbed by cough. No improvement with OTC medication.    Review of Systems See HPI . Denies chest pains, palpitations and leg swelling Denies abdominal pain, nausea, vomiting,diarrhea or constipation.   Denies dysuria, frequency, hesitancy or incontinence. Denies joint pain, swelling and limitation in mobility. Denies  seizures, numbness, or tingling. Denies depression, anxiety or insomnia. Denies skin break down or rash.         Objective:   Physical Exam BP 152/86 mmHg  Pulse 70  Temp(Src) 98.6 F (37 C)  Resp 18  Ht 5\' 7"  (1.702 m)  Wt 224 lb (101.606 kg)  BMI 35.08 kg/m2  SpO2 97%  Patient alert and oriented and in no cardiopulmonary distress.  HEENT: No facial asymmetry, EOMI,   oropharynx pink and moist.  Neck supple no JVD, no mass. Maxillary sinus tenderness, TM clear bilaterally  Chest: Adequate  Air bilaterally, few crackles, bilateral wheezes.  CVS: S1, S2 no murmurs, no S3.Regular rate.  ABD: Soft non tender.   Ext: No edema  MS: Adequate ROM spine, shoulders, hips and knees.  Skin: Intact, no ulcerations or rash noted.  Psych: Good eye contact, normal affect. Memory intact not anxious or depressed appearing.  CNS: CN 2-12 intact, power,  normal throughout.no focal deficits noted.        Assessment & Plan:  Acute bronchitis Antibiotic, decongestant , cough suppressant , neb treatment in office  and albuterol MDI prescribed Call back  Next week if no better for CXR  Acute sinusitis Antibiotic prescribed  Headache  disorder Increased generalized headache due to excess cough and poor sleep, tylenol administered in office pt on chronic anti coagullant  HTN (hypertension) Elevated at this visit, no med change, likely elevated due to excess cough and sleep deprivation

## 2014-10-31 NOTE — Patient Instructions (Signed)
Nurse BP check and flu vaccine 3rd week in October, call if you need me sooner  MD follow up as before   You are treated for acute sinusitis and bronchitis, decongestant , antibiotic , cough suppresant and inhaler sent , also breathing treatment in office  Pls get NOn fast hBa1C , chem 7 and EGFR, end October, lab order to be given today

## 2014-11-01 ENCOUNTER — Telehealth: Payer: Self-pay

## 2014-11-01 DIAGNOSIS — J329 Chronic sinusitis, unspecified: Secondary | ICD-10-CM | POA: Insufficient documentation

## 2014-11-01 MED ORDER — HYDROCOD POLST-CPM POLST ER 10-8 MG/5ML PO SUER: ORAL | Status: DC

## 2014-11-01 NOTE — Assessment & Plan Note (Signed)
Increased generalized headache due to excess cough and poor sleep, tylenol administered in office pt on chronic anti coagullant

## 2014-11-01 NOTE — Assessment & Plan Note (Signed)
Antibiotic prescribed 

## 2014-11-01 NOTE — Telephone Encounter (Signed)
Sister notified and husband will come and collect rx.

## 2014-11-01 NOTE — Assessment & Plan Note (Addendum)
Elevated at this visit, no med change, likely elevated due to excess cough and sleep deprivation

## 2014-11-01 NOTE — Assessment & Plan Note (Signed)
Antibiotic, decongestant , cough suppressant , neb treatment in office  and albuterol MDI prescribed Call back  Next week if no better for CXR

## 2014-11-01 NOTE — Telephone Encounter (Signed)
Script printed pls let family be aware

## 2014-11-05 ENCOUNTER — Telehealth: Payer: Self-pay | Admitting: *Deleted

## 2014-11-05 MED ORDER — MICONAZOLE-ZINC OXIDE-PETROLAT 0.25-15-81.35 % EX OINT: TOPICAL_OINTMENT | CUTANEOUS | Status: DC

## 2014-11-05 MED ORDER — FLUCONAZOLE 100 MG PO TABS: 100.0000 mg | ORAL_TABLET | Freq: Every day | ORAL | Status: DC

## 2014-11-05 NOTE — Telephone Encounter (Signed)
Mrs [name] called and left message on machine stating something has been on her mind since the other day she was here, Mrs [name] stated she did not know what was wrong with [name] that day and she is not getting better and [name] hasn't called her and she can't get better or get her blood pressure to get better with Dr [name] acting the way she was acting that day,  [name] needs to call her so she can report her or find another doctor with her acting the way she was acting, Mrs [name] stated she wants [name] to call her, Mrs [name] stated she does not know what's going on with [name] and if she has a problem she needs to tell somebody else and not take it out on her and her husband because she can get [employer] to find her another doctor.

## 2014-11-05 NOTE — Telephone Encounter (Signed)
I spoke directly with the pt, stated I was very sorry that she had that impression of her ,most recent visit, anf that I absolutely  Do care about her and her wellbeing. She states the cough is better and the breathing treatment helped her C/o rash under her breasts which was present at the time of the visit, was not examined , she had no chance to discuss the rash, and it is still a problem I offered a "walk in " visit to see the rash and prescribe appropriately, she is choosing to  Have me send in medication to local pharmacy. Fluconazole 100 mg sent for 3 days, I called and told pt not to take lipitor on the days that she is taking the fluconazole, she affirmed that she understands what I advised. I called in vusion to be applied for  10 days , then as needed She is reminded of need for labs 10/19 when she has nurse BP check and flu vaccine

## 2014-11-06 ENCOUNTER — Other Ambulatory Visit: Payer: Self-pay

## 2014-11-06 ENCOUNTER — Telehealth: Payer: Self-pay | Admitting: Family Medicine

## 2014-11-06 MED ORDER — NYSTATIN 100000 UNIT/GM EX OINT: 1.0000 "application " | TOPICAL_OINTMENT | Freq: Two times a day (BID) | CUTANEOUS | Status: DC

## 2014-11-06 MED ORDER — CLOTRIMAZOLE-BETAMETHASONE 1-0.05 % EX CREA: 1.0000 "application " | TOPICAL_CREAM | Freq: Two times a day (BID) | CUTANEOUS | Status: DC

## 2014-11-06 NOTE — Telephone Encounter (Signed)
The med wasn't covered and was very expensive and I called in the 2 creams to replace it. Patient is aware

## 2014-11-06 NOTE — Telephone Encounter (Signed)
Pls contact pt/pharmacy to see if vusion ointm whuich comes up as generis was filled/ affordable for this pt, I sent it yesterday. If not pls send clotrimazole/ betameth cream twice daily for 10 days then as needed, and nystatin oint/ cream, whichever is avilable twice daice daily for 10 days then as needed  ?? pls ask

## 2014-11-18 ENCOUNTER — Ambulatory Visit (INDEPENDENT_AMBULATORY_CARE_PROVIDER_SITE_OTHER): Payer: Commercial Managed Care - HMO | Admitting: Family Medicine

## 2014-11-18 ENCOUNTER — Other Ambulatory Visit: Payer: Self-pay

## 2014-11-18 ENCOUNTER — Encounter: Payer: Self-pay | Admitting: Family Medicine

## 2014-11-18 VITALS — BP 148/90 | HR 82 | Temp 98.2°F | Resp 16 | Ht 67.0 in | Wt 218.0 lb

## 2014-11-18 DIAGNOSIS — R059 Cough, unspecified: Secondary | ICD-10-CM

## 2014-11-18 DIAGNOSIS — J329 Chronic sinusitis, unspecified: Secondary | ICD-10-CM

## 2014-11-18 DIAGNOSIS — K219 Gastro-esophageal reflux disease without esophagitis: Secondary | ICD-10-CM | POA: Diagnosis not present

## 2014-11-18 DIAGNOSIS — R05 Cough: Secondary | ICD-10-CM | POA: Diagnosis not present

## 2014-11-18 DIAGNOSIS — R053 Chronic cough: Secondary | ICD-10-CM

## 2014-11-18 DIAGNOSIS — J302 Other seasonal allergic rhinitis: Secondary | ICD-10-CM

## 2014-11-18 DIAGNOSIS — N644 Mastodynia: Secondary | ICD-10-CM

## 2014-11-18 DIAGNOSIS — I10 Essential (primary) hypertension: Secondary | ICD-10-CM

## 2014-11-18 DIAGNOSIS — M545 Low back pain: Secondary | ICD-10-CM

## 2014-11-18 DIAGNOSIS — M25511 Pain in right shoulder: Secondary | ICD-10-CM

## 2014-11-18 DIAGNOSIS — R42 Dizziness and giddiness: Secondary | ICD-10-CM | POA: Insufficient documentation

## 2014-11-18 DIAGNOSIS — N3000 Acute cystitis without hematuria: Secondary | ICD-10-CM | POA: Insufficient documentation

## 2014-11-18 MED ORDER — FLUTICASONE PROPIONATE 50 MCG/ACT NA SUSP: 2.0000 | Freq: Every day | NASAL | Status: DC

## 2014-11-18 MED ORDER — CLONIDINE HCL 0.1 MG PO TABS: 0.1000 mg | ORAL_TABLET | Freq: Every day | ORAL | Status: DC

## 2014-11-18 MED ORDER — HYDROCODONE-HOMATROPINE 5-1.5 MG/5ML PO SYRP: 5.0000 mL | ORAL_SOLUTION | Freq: Every evening | ORAL | Status: DC | PRN

## 2014-11-18 MED ORDER — MONTELUKAST SODIUM 10 MG PO TABS: 10.0000 mg | ORAL_TABLET | Freq: Every day | ORAL | Status: DC

## 2014-11-18 MED ORDER — CYCLOBENZAPRINE HCL 10 MG PO TABS: 10.0000 mg | ORAL_TABLET | Freq: Every day | ORAL | Status: DC

## 2014-11-18 NOTE — Assessment & Plan Note (Signed)
Uncontrolled, toradol in office and trial of immitrex spray

## 2014-11-18 NOTE — Assessment & Plan Note (Signed)
Uncontrolled with dysphagia , refer to GI for further eval

## 2014-11-18 NOTE — Assessment & Plan Note (Signed)
Controlled, no change in medication DASH diet and commitment to daily physical activity for a minimum of 30 minutes discussed and encouraged, as a part of hypertension management. The importance of attaining a healthy weight is also discussed.  BP/Weight 11/18/2014 10/31/2014 09/03/2014 07/17/2014 07/09/2014 07/02/2014 7/36/6815  Systolic BP 947 076 151 834 373 - 578  Diastolic BP 90 86 78 74 87 - 74  Wt. (Lbs) 218 224 218 220.8 213 223 224.12  BMI 34.14 35.08 34.14 34.57 33.35 37.11 37.3

## 2014-11-18 NOTE — Assessment & Plan Note (Signed)
Continue aricept daily

## 2014-11-18 NOTE — Assessment & Plan Note (Signed)
Uncontrolled, and in setting of chronic cough, d/c ACE , add clonidine at bedtime

## 2014-11-18 NOTE — Assessment & Plan Note (Signed)
Pain and reduced mobility, no recent direct trauma

## 2014-11-18 NOTE — Assessment & Plan Note (Signed)
Chronic and uncontrolled facial pressure and nasal drainage, likely due to uncontrolled allergies, will obtain sinus x ray to r/o infection

## 2014-11-18 NOTE — Assessment & Plan Note (Signed)
Hyperlipidemia:Low fat diet discussed and encouraged.   Lipid Panel  Lab Results  Component Value Date   CHOL 145 09/03/2014   HDL 61 09/03/2014   LDLCALC 56 09/03/2014   TRIG 139 09/03/2014   CHOLHDL 2.4 09/03/2014   Controlled, no change in medication

## 2014-11-18 NOTE — Progress Notes (Signed)
Subjective:    Patient ID: [name], female    DOB: Sep 13, 1940, 74 y.o.   MRN: [id number]  HPI   [name]     MRN: [id number]      DOB: 04/04/1940   HPI [name] is here for follow up and re-evaluation of chronic medical conditions, medication management and review of any available recent lab and radiology data.  Preventive health is updated, specifically  Cancer screening and Immunization.   Questions or concerns regarding consultations or procedures which the PT has had in the interim are  addressed. The PT denies any adverse reactions to current medications since the last visit.   ROS Denies recent fever or chills. Denies sinus pressure, nasal congestion, ear pain or sore throat. Denies chest congestion, productive cough or wheezing. Denies chest pains, palpitations and leg swelling Denies abdominal pain, c/o nausea associated with headache , denies vomiting ,Increased frequency and dysuria for past 3 days, denies flank pain or fever Chronic back pain and  limitation in mobility. C/o chronic headache, denies seizures, numbness, or tingling. Denies depression, c/o increased  anxiety and r insomnia. Denies skin break down or rash.   PE  BP 128/78 mmHg  Pulse 78  Resp 16  Ht 5\' 7"  (1.702 m)  Wt 218 lb (98.884 kg)  BMI 34.14 kg/m2  SpO2 98%  Patient alert and oriented and in no cardiopulmonary distress.  HEENT: No facial asymmetry, EOMI,   oropharynx pink and moist.  Neck supple no JVD, no mass.  Chest: Clear to auscultation bilaterally.  CVS: S1, S2 no murmurs, no S3.Regular rate.  ABD: Soft non tender.   Ext: No edema  MS: Adequate though reduced  ROM spine, shoulders, hips and knees.  Skin: Intact, no ulcerations or rash noted.  Psych: Good eye contact, normal affect. Memory loss, both  anxious and  depressed appearing.  CNS: CN 2-12 intact, power,  normal throughout.no focal deficits noted.   Assessment & Plan   Nausea without  vomiting zofran in office for nausea associated with headache  Headache disorder Uncontrolled, toradol in office and trial of immitrex spray  HTN (hypertension) Controlled, no change in medication DASH diet and commitment to daily physical activity for a minimum of 30 minutes discussed and encouraged, as a part of hypertension management. The importance of attaining a healthy weight is also discussed.  BP/Weight 11/18/2014 10/31/2014 09/03/2014 07/17/2014 07/09/2014 07/02/2014 9/67/8938  Systolic BP 101 751 025 852 778 - 242  Diastolic BP 90 86 78 74 87 - 74  Wt. (Lbs) 218 224 218 220.8 213 223 224.12  BMI 34.14 35.08 34.14 34.57 33.35 37.11 37.3        Hyperlipemia Hyperlipidemia:Low fat diet discussed and encouraged.   Lipid Panel  Lab Results  Component Value Date   CHOL 145 09/03/2014   HDL 61 09/03/2014   LDLCALC 56 09/03/2014   TRIG 139 09/03/2014   CHOLHDL 2.4 09/03/2014   Controlled, no change in medication      Depression with anxiety Uncontrolled , start buspar  Type 2 diabetes mellitus with HbA1C goal below 8.0 [name] is reminded of the importance of commitment to daily physical activity for 30 minutes or more, as able and the need to limit carbohydrate intake to 30 to 60 grams per meal to help with blood sugar control.   The need to take medication as prescribed, test blood sugar as directed, and to call between visits if there is a concern that blood  sugar is uncontrolled is also discussed.   [name] is reminded of the importance of daily foot exam, annual eye examination, and good blood sugar, blood pressure and cholesterol control. Improved, no med change  Diabetic Labs Latest Ref Rng 09/03/2014 07/09/2014 06/06/2014 11/15/2013 04/19/2013  HbA1c <5.7 % 7.5(H) - 7.8(H) 6.8(H) 6.7(H)  Microalbumin <2.0 mg/dL 4.5(H) - - 12.0(H) -  Micro/Creat Ratio 0.0 - 30.0 mg/g 16.2 - - 29.8 -  Chol 0 - 200 mg/dL 145 - - 161 164  HDL >=46 mg/dL 61 - - 60 68  Calc LDL 0 -  99 mg/dL 56 - - 69 70  Triglycerides <150 mg/dL 139 - - 162(H) 128  Creatinine 0.50 - 1.10 mg/dL 0.97 1.13(H) 0.87 1.17(H) 1.13(H)   BP/Weight 11/18/2014 10/31/2014 09/03/2014 07/17/2014 07/09/2014 07/02/2014 05/24/8117  Systolic BP 147 829 562 130 865 - 784  Diastolic BP 90 86 78 74 87 - 74  Wt. (Lbs) 218 224 218 220.8 213 223 224.12  BMI 34.14 35.08 34.14 34.57 33.35 37.11 37.3   Foot/eye exam completion dates 11/15/2013 01/21/2009  Foot exam Order - yes  Foot Form Completion Done -      `   Acute cystitis without hematuria Mildly symptomatic, no fever or chills , will wait on c/s prior to prescribing antibiotic  Intermittent vertigo antivert , as needed, in limited amount prescribed  Memory loss Continue aricept daily      Review of Systems     Objective:   Physical Exam        Assessment & Plan:

## 2014-11-18 NOTE — Assessment & Plan Note (Signed)
antivert , as needed, in limited amount prescribed

## 2014-11-18 NOTE — Assessment & Plan Note (Signed)
Mildly symptomatic, no fever or chills , will wait on c/s prior to prescribing antibiotic

## 2014-11-18 NOTE — Assessment & Plan Note (Signed)
[name] is reminded of the importance of commitment to daily physical activity for 30 minutes or more, as able and the need to limit carbohydrate intake to 30 to 60 grams per meal to help with blood sugar control.   The need to take medication as prescribed, test blood sugar as directed, and to call between visits if there is a concern that blood sugar is uncontrolled is also discussed.   [name] is reminded of the importance of daily foot exam, annual eye examination, and good blood sugar, blood pressure and cholesterol control. Improved, no med change  Diabetic Labs Latest Ref Rng 09/03/2014 07/09/2014 06/06/2014 11/15/2013 04/19/2013  HbA1c <5.7 % 7.5(H) - 7.8(H) 6.8(H) 6.7(H)  Microalbumin <2.0 mg/dL 4.5(H) - - 12.0(H) -  Micro/Creat Ratio 0.0 - 30.0 mg/g 16.2 - - 29.8 -  Chol 0 - 200 mg/dL 145 - - 161 164  HDL >=46 mg/dL 61 - - 60 68  Calc LDL 0 - 99 mg/dL 56 - - 69 70  Triglycerides <150 mg/dL 139 - - 162(H) 128  Creatinine 0.50 - 1.10 mg/dL 0.97 1.13(H) 0.87 1.17(H) 1.13(H)   BP/Weight 11/18/2014 10/31/2014 09/03/2014 07/17/2014 07/09/2014 07/02/2014 5/32/9924  Systolic BP 268 341 962 229 798 - 921  Diastolic BP 90 86 78 74 87 - 74  Wt. (Lbs) 218 224 218 220.8 213 223 224.12  BMI 34.14 35.08 34.14 34.57 33.35 37.11 37.3   Foot/eye exam completion dates 11/15/2013 01/21/2009  Foot exam Order - yes  Foot Form Completion Done -      `

## 2014-11-18 NOTE — Assessment & Plan Note (Signed)
Uncontrolled , start buspar

## 2014-11-18 NOTE — Assessment & Plan Note (Signed)
Increased and uncontrolled , add daily flexeril, obtain x ray

## 2014-11-18 NOTE — Assessment & Plan Note (Signed)
zofran in office for nausea associated with headache

## 2014-11-18 NOTE — Assessment & Plan Note (Signed)
Chronic cough, likely allergy triggered, possible reactive air way disease, refer for PFT, hold on additional inhaler , neb treatment in office, also add singulair daily

## 2014-11-18 NOTE — Assessment & Plan Note (Signed)
Uncontrolled ,the most likley trigger for chronic cough syndrome, start singulair and flonase daily

## 2014-11-18 NOTE — Patient Instructions (Addendum)
F/u in 5 weeeks , call if you need me before  Breathing treatment in ioffice and you are referred for lung function tests, we will call with appt  CXR and Sinus X ray today, also x ray of Low back, L/S spine due to increased pain Mammogram was ordered in July, we will attempt to get a date for you before you leave , otherwise you will be contacted in am with the info  STOP ramipril , may be causing you to cough  New medication for cough and uncontrolled allergies , singul 10 mg  one daily, flonase 2 puffs per nostril daily, and you will continue to have the cough suppressant syrup for bedtime use until improved  Flexeril 5 mg one at bedtime for back pain and spasm  Clonidine 0.1mg  one at bedtime for uncontrolled blood pressure in place of the ramipril  You are referred to Dr Laural Golden since you feel as though food is sticking in your throat when you swallow, he will also let you know if some of the cough is from reflux

## 2014-11-18 NOTE — Progress Notes (Signed)
   Subjective:    Patient ID: [name], female    DOB: May 28, 1940, 74 y.o.   MRN: [id number]  HPI   [name]     MRN: [id number]      DOB: 1941-02-04   HPI [name] is here with several concerns. States her excessive cough is back, unable to sleep atnight due to excess cough, denies recent fever or chills. C/o headache due to poor sleep and cough. C/o facial pressure and excessive nasal congestion and drainage C/oright breast pain and thinks that she feels a lump around 9am position x 1 week C/o food sticking and choking sensation, wants to see GI Doc. C/o increased low back/ tailbone pain, no falls, also c/o pain and reduced mobility in right shoulder  ROS Denies recent fever or chills. C/o chronic cough and wheeze. Denies chest pains, palpitations, PND, orthopnea and leg swelling C/o dysphagia and nausea over 5 weeks duration   Denies dysuria, frequency, hesitancy or incontinence.  Denies depression, does have anxiety , does have  Insomnia due to excess cough Denies skin break down or rash.   PE  BP 148/90 mmHg  Pulse 82  Temp(Src) 98.2 F (36.8 C) (Oral)  Resp 16  Ht 5\' 7"  (1.702 m)  Wt 218 lb (98.884 kg)  BMI 34.14 kg/m2  SpO2 99%  Patient alert and oriented and in no cardiopulmonary distress.  HEENT: No facial asymmetry, EOMI,   oropharynx pink and moist.  Neck supple no JVD, no mass.Erythema and edema of nasal mucosa, excessive sneezing, mucoid nasal drainage , and watery eyes Chest: decreased though adequate air entry, no crackles , few wheezes Breast: no palpable mass, no asymetry, no nipple d/c or inversion. No axillary or supraclavicular adenopathy, tender over 9 o clock position of right breast CVS: S1, S2 no murmurs, no S3.Regular rate.  ABD: Soft non tender.   Ext: No edema  MS: Decreased ROM lumbar  Spine, normal in  , hips and knees.Decreased in right shoulder  Skin: Intact, no ulcerations or rash noted.  Psych: Good eye contact, normal  affect. Memory impaired , mildly anxiousnot r depressed appearing.  CNS: CN 2-12 intact, power,  normal throughout.no focal deficits noted.   Assessment & Plan   HTN (hypertension) Uncontrolled, and in setting of chronic cough, d/c ACE , add clonidine at bedtime  Gastroesophageal reflux disease Uncontrolled with dysphagia , refer to GI for further eval  Chronic bronchitis (HCC) Chronic cough, likely allergy triggered, possible reactive air way disease, refer for PFT, hold on additional inhaler , neb treatment in office, also add singulair daily  Shoulder pain, right Pain and reduced mobility, no recent direct trauma  Chronic sinusitis Chronic and uncontrolled facial pressure and nasal drainage, likely due to uncontrolled allergies, will obtain sinus x ray to r/o infection  Seasonal allergies Uncontrolled ,the most likley trigger for chronic cough syndrome, start singulair and flonase daily  Back pain Increased and uncontrolled , add daily flexeril, obtain x ray       Review of Systems     Objective:   Physical Exam        Assessment & Plan:

## 2014-11-20 ENCOUNTER — Ambulatory Visit (HOSPITAL_COMMUNITY)
Admission: RE | Admit: 2014-11-20 | Discharge: 2014-11-20 | Disposition: A | Discharge status: Home / Self Care | Payer: Commercial Managed Care - HMO | Source: Ambulatory Visit | Attending: Family Medicine | Admitting: Family Medicine

## 2014-11-20 DIAGNOSIS — R937 Abnormal findings on diagnostic imaging of other parts of musculoskeletal system: Secondary | ICD-10-CM | POA: Insufficient documentation

## 2014-11-20 DIAGNOSIS — M5136 Other intervertebral disc degeneration, lumbar region: Secondary | ICD-10-CM | POA: Diagnosis not present

## 2014-11-20 DIAGNOSIS — I4891 Unspecified atrial fibrillation: Secondary | ICD-10-CM | POA: Diagnosis not present

## 2014-11-20 DIAGNOSIS — R05 Cough: Secondary | ICD-10-CM | POA: Insufficient documentation

## 2014-11-20 DIAGNOSIS — M545 Low back pain: Secondary | ICD-10-CM | POA: Insufficient documentation

## 2014-11-20 DIAGNOSIS — M25511 Pain in right shoulder: Secondary | ICD-10-CM | POA: Insufficient documentation

## 2014-11-20 DIAGNOSIS — E785 Hyperlipidemia, unspecified: Secondary | ICD-10-CM | POA: Insufficient documentation

## 2014-11-20 DIAGNOSIS — J329 Chronic sinusitis, unspecified: Secondary | ICD-10-CM

## 2014-11-20 DIAGNOSIS — R0981 Nasal congestion: Secondary | ICD-10-CM | POA: Diagnosis not present

## 2014-11-20 DIAGNOSIS — R059 Cough, unspecified: Secondary | ICD-10-CM

## 2014-11-21 ENCOUNTER — Other Ambulatory Visit: Payer: Self-pay

## 2014-11-21 DIAGNOSIS — M25511 Pain in right shoulder: Secondary | ICD-10-CM

## 2014-11-22 ENCOUNTER — Ambulatory Visit (HOSPITAL_COMMUNITY)
Admission: RE | Admit: 2014-11-22 | Discharge: 2014-11-22 | Disposition: A | Discharge status: Home / Self Care | Payer: Commercial Managed Care - HMO | Source: Ambulatory Visit | Attending: Family Medicine | Admitting: Family Medicine

## 2014-11-22 DIAGNOSIS — R05 Cough: Secondary | ICD-10-CM | POA: Insufficient documentation

## 2014-11-22 DIAGNOSIS — R053 Chronic cough: Secondary | ICD-10-CM

## 2014-11-22 LAB — PULMONARY FUNCTION TEST
DL/VA % PRED: 85 %
DL/VA: 4.41 ml/min/mmHg/L
DLCO UNC: 13.09 ml/min/mmHg
DLCO unc % pred: 46 %
FEF 25-75 POST: 2.64 L/s
FEF 25-75 Pre: 2.5 L/sec
FEF2575-%Change-Post: 5 %
FEF2575-%Pred-Post: 154 %
FEF2575-%Pred-Pre: 146 %
FEV1-%Change-Post: 3 %
FEV1-%Pred-Post: 95 %
FEV1-%Pred-Pre: 93 %
FEV1-Post: 1.89 L
FEV1-Pre: 1.83 L
FEV1FVC-%CHANGE-POST: 1 %
FEV1FVC-%Pred-Pre: 112 %
FEV6-%CHANGE-POST: 1 %
FEV6-%PRED-PRE: 87 %
FEV6-%Pred-Post: 88 %
FEV6-POST: 2.17 L
FEV6-PRE: 2.14 L
FEV6FVC-%PRED-POST: 104 %
FEV6FVC-%PRED-PRE: 104 %
FVC-%Change-Post: 1 %
FVC-%PRED-POST: 85 %
FVC-%PRED-PRE: 84 %
FVC-POST: 2.17 L
FVC-PRE: 2.14 L
POST FEV6/FVC RATIO: 100 %
PRE FEV1/FVC RATIO: 86 %
Post FEV1/FVC ratio: 87 %
Pre FEV6/FVC Ratio: 100 %

## 2014-11-22 MED ORDER — ALBUTEROL SULFATE (2.5 MG/3ML) 0.083% IN NEBU
2.5000 mg | INHALATION_SOLUTION | Freq: Once | RESPIRATORY_TRACT | Status: AC
  Administered 2014-11-22: 2.5 mg via RESPIRATORY_TRACT

## 2014-11-26 ENCOUNTER — Ambulatory Visit (INDEPENDENT_AMBULATORY_CARE_PROVIDER_SITE_OTHER): Payer: Commercial Managed Care - HMO | Admitting: Internal Medicine

## 2014-11-27 ENCOUNTER — Other Ambulatory Visit: Payer: Self-pay | Admitting: Family Medicine

## 2014-12-03 ENCOUNTER — Other Ambulatory Visit: Payer: Self-pay

## 2014-12-03 MED ORDER — GABAPENTIN 100 MG PO CAPS: 100.0000 mg | ORAL_CAPSULE | Freq: Every day | ORAL | Status: DC

## 2014-12-04 ENCOUNTER — Telehealth: Payer: Self-pay | Admitting: *Deleted

## 2014-12-04 ENCOUNTER — Ambulatory Visit: Payer: Self-pay

## 2014-12-04 NOTE — Telephone Encounter (Signed)
Spoke with sister and she will have patient to collect Gabapentin today and try this for back pain.

## 2014-12-04 NOTE — Telephone Encounter (Signed)
[name] called requesting something for patient's back. Please call [name] [phone]

## 2014-12-10 ENCOUNTER — Encounter (HOSPITAL_COMMUNITY): Payer: Self-pay

## 2014-12-10 DIAGNOSIS — H524 Presbyopia: Secondary | ICD-10-CM | POA: Diagnosis not present

## 2014-12-10 DIAGNOSIS — H5203 Hypermetropia, bilateral: Secondary | ICD-10-CM | POA: Diagnosis not present

## 2014-12-10 DIAGNOSIS — H52223 Regular astigmatism, bilateral: Secondary | ICD-10-CM | POA: Diagnosis not present

## 2014-12-10 DIAGNOSIS — H401132 Primary open-angle glaucoma, bilateral, moderate stage: Secondary | ICD-10-CM | POA: Diagnosis not present

## 2014-12-10 LAB — HM DIABETES EYE EXAM

## 2014-12-11 ENCOUNTER — Ambulatory Visit (INDEPENDENT_AMBULATORY_CARE_PROVIDER_SITE_OTHER): Payer: Commercial Managed Care - HMO | Admitting: Internal Medicine

## 2014-12-17 ENCOUNTER — Encounter (INDEPENDENT_AMBULATORY_CARE_PROVIDER_SITE_OTHER): Payer: Self-pay | Admitting: *Deleted

## 2014-12-19 ENCOUNTER — Telehealth: Payer: Self-pay | Admitting: Family Medicine

## 2014-12-19 ENCOUNTER — Other Ambulatory Visit: Payer: Self-pay | Admitting: Family Medicine

## 2014-12-19 MED ORDER — OXYBUTYNIN CHLORIDE 5 MG PO TABS: ORAL_TABLET | ORAL | Status: DC

## 2014-12-19 NOTE — Telephone Encounter (Signed)
I sent a script for 20 tabs for as needed use of oxybutynin, pls let her know

## 2014-12-19 NOTE — Telephone Encounter (Signed)
With no infectious symptoms, I recommend vesicare 10 mg one daily as needed, #30 no refill. PLS call and see if covered , and if not what is and let me know, oxybutynin may best possibility, pls send a msg back to me , I will send in once you have the info

## 2014-12-19 NOTE — Telephone Encounter (Signed)
They pharmacy has to run a prescription to see if covered or not so oxybutynin is the better option.

## 2014-12-19 NOTE — Telephone Encounter (Signed)
Pt aware.

## 2014-12-19 NOTE — Telephone Encounter (Signed)
C/o leaking her urine x 3-4 days and she has a funeral to go to tomorrow and wanted some antibiotics for possible uti. Said she had no other symptoms such as pain or dysuria, no fever, chills, etc. No way to come today for urine testing. Please advise if there is anything else she can do for urine leakage

## 2014-12-19 NOTE — Telephone Encounter (Signed)
[name] left a message on the machine that she needs some antibiotics for  A bladder infection, she states she has a funeral to attend tomorrow and she needs a few antibiotics to get her through, please advise?

## 2014-12-24 ENCOUNTER — Ambulatory Visit (HOSPITAL_COMMUNITY)
Admission: RE | Admit: 2014-12-24 | Discharge: 2014-12-24 | Disposition: A | Discharge status: Home / Self Care | Payer: Commercial Managed Care - HMO | Source: Ambulatory Visit | Attending: Family Medicine | Admitting: Family Medicine

## 2014-12-24 ENCOUNTER — Other Ambulatory Visit: Payer: Self-pay | Admitting: Family Medicine

## 2014-12-24 DIAGNOSIS — N63 Unspecified lump in breast: Secondary | ICD-10-CM | POA: Insufficient documentation

## 2014-12-24 DIAGNOSIS — N644 Mastodynia: Secondary | ICD-10-CM

## 2015-02-06 ENCOUNTER — Encounter: Payer: Self-pay | Admitting: Family Medicine

## 2015-02-16 ENCOUNTER — Other Ambulatory Visit: Payer: Self-pay | Admitting: Family Medicine

## 2015-02-24 ENCOUNTER — Other Ambulatory Visit: Payer: Self-pay | Admitting: Family Medicine

## 2015-02-24 ENCOUNTER — Other Ambulatory Visit: Payer: Self-pay | Admitting: Cardiology

## 2015-03-07 ENCOUNTER — Ambulatory Visit (INDEPENDENT_AMBULATORY_CARE_PROVIDER_SITE_OTHER): Payer: Commercial Managed Care - HMO | Admitting: Family Medicine

## 2015-03-07 ENCOUNTER — Encounter: Payer: Self-pay | Admitting: Family Medicine

## 2015-03-07 VITALS — BP 134/90 | HR 70 | Resp 16 | Ht 67.0 in | Wt 224.0 lb

## 2015-03-07 DIAGNOSIS — E119 Type 2 diabetes mellitus without complications: Secondary | ICD-10-CM

## 2015-03-07 DIAGNOSIS — E785 Hyperlipidemia, unspecified: Secondary | ICD-10-CM

## 2015-03-07 DIAGNOSIS — J209 Acute bronchitis, unspecified: Secondary | ICD-10-CM | POA: Diagnosis not present

## 2015-03-07 DIAGNOSIS — I10 Essential (primary) hypertension: Secondary | ICD-10-CM

## 2015-03-07 DIAGNOSIS — F418 Other specified anxiety disorders: Secondary | ICD-10-CM

## 2015-03-07 MED ORDER — IPRATROPIUM BROMIDE 0.02 % IN SOLN
0.5000 mg | Freq: Once | RESPIRATORY_TRACT | Status: AC
  Administered 2015-03-07: 0.5 mg via RESPIRATORY_TRACT

## 2015-03-07 MED ORDER — AZITHROMYCIN 250 MG PO TABS: ORAL_TABLET | ORAL | Status: DC

## 2015-03-07 MED ORDER — BENZONATATE 100 MG PO CAPS: 100.0000 mg | ORAL_CAPSULE | Freq: Two times a day (BID) | ORAL | Status: DC | PRN

## 2015-03-07 MED ORDER — ALBUTEROL SULFATE HFA 108 (90 BASE) MCG/ACT IN AERS: 2.0000 | INHALATION_SPRAY | Freq: Four times a day (QID) | RESPIRATORY_TRACT | Status: DC | PRN

## 2015-03-07 MED ORDER — HYDROCOD POLST-CPM POLST ER 10-8 MG/5ML PO SUER: ORAL | Status: DC

## 2015-03-07 MED ORDER — ALBUTEROL SULFATE (2.5 MG/3ML) 0.083% IN NEBU
2.5000 mg | INHALATION_SOLUTION | Freq: Once | RESPIRATORY_TRACT | Status: AC
  Administered 2015-03-07: 2.5 mg via RESPIRATORY_TRACT

## 2015-03-07 NOTE — Patient Instructions (Addendum)
CPE in 3 month, call if you need me sooner  You are treated for acute bronchitis  Fasting labs need to be done as soon as possibl;e, they are over due  Neb treatment in office today  Condolence re recent loss  [facility] you feel better soon

## 2015-03-09 NOTE — Assessment & Plan Note (Signed)
Updated lab needed.  

## 2015-03-09 NOTE — Progress Notes (Signed)
   Subjective:    Patient ID: [name], female    DOB: 16-Mar-1940, 75 y.o.   MRN: [id number]  HPI Pt in tearful, anxious and crying on her way to her brother's viewing, died earlier this week. Scheduled visit is adjusted to deal with her greatest need, which is respiratory  1 week h/o worsening cough, sputum , shortness of breath and wheeze, intermittent chils. She denies sinus pressure, sore throat or ear pain  Review of Systems See HPI  Denies chest pains, palpitation, PND , orthopnea  and leg swelling Denies abdominal pain, nausea, vomiting,diarrhea or constipation.   Denies dysuria, frequency, hesitancy or incontinence. Chronic  joint pain, unchanged. Denies uncontrolled  headaches, seizures, numbness, or tingling. . Denies skin break down or rash.        Objective:   Physical Exam BP 134/90 mmHg  Pulse 70  Resp 16  Ht 5\' 7"  (1.702 m)  Wt 224 lb (101.606 kg)  BMI 35.08 kg/m2  SpO2 97%  Patient alert and oriented tearful, crying out loud in emotional distress and grief, anxious, mild  cardiopulmonary distress.  HEENT: No facial asymmetry, EOMI,   oropharynx pink and moist.  Neck supple no JVD, no mass.  Chest: decreased though adequate  air entry, bilateral wheezes , few crackles CVS: S1, S2 no murmurs, no S3.Regular rate.  ABD: Soft non tender.   Ext: No edema  MS: Adequate though decreased  ROM spine, shoulders, hips and knees.  Skin: Intact, no ulcerations or rash noted.  CNS: CN 2-12 intact, power,  normal throughout.no focal deficits noted.        Assessment & Plan:  Acute bronchitis Neb treatment in office. Azithromycin, tessalon perles and tussionex prescribed  HTN (hypertension) Controlled, no change in medication   Depression with anxiety Increased anxiety and acute grief, pt on her way to funeral home , recent loss of her brother  Hyperlipemia Updated lab needed    Type 2 diabetes mellitus with HbA1C goal below 8.0 Updated lab  needed also foot exam past due, however, unable to do at this visit due to patient's emotional state, will address on her return.

## 2015-03-09 NOTE — Assessment & Plan Note (Signed)
Increased anxiety and acute grief, pt on her way to funeral home , recent loss of her brother

## 2015-03-09 NOTE — Assessment & Plan Note (Signed)
Controlled, no change in medication  

## 2015-03-09 NOTE — Assessment & Plan Note (Signed)
Updated lab needed also foot exam past due, however, unable to do at this visit due to patient's emotional state, will address on her return.

## 2015-03-09 NOTE — Assessment & Plan Note (Signed)
Neb treatment in office. Azithromycin, tessalon perles and tussionex prescribed

## 2015-03-19 ENCOUNTER — Other Ambulatory Visit: Payer: Self-pay

## 2015-03-19 ENCOUNTER — Telehealth: Payer: Self-pay

## 2015-03-19 MED ORDER — GABAPENTIN 300 MG PO CAPS: 300.0000 mg | ORAL_CAPSULE | Freq: Every day | ORAL | Status: DC

## 2015-03-19 NOTE — Telephone Encounter (Signed)
Med sent- patient declines PT at this time. Wants to know if she can come in tomorrow to get injections for her back pain?

## 2015-03-19 NOTE — Telephone Encounter (Signed)
States she wants her bones checked. She hurts and is still all over, especially in her lower back legs and shoulders and wants it checked because it kills her when she has to get up and walk around. Wants to know if you will order an xray or something to check it out. Please advise

## 2015-03-19 NOTE — Telephone Encounter (Signed)
dexa is past due pls order, explain this will tell if bones are thinning. 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 pls ask

## 2015-03-20 NOTE — Telephone Encounter (Signed)
pls let her know as on blood thinner, not recommended  To get the injections that I give in the office

## 2015-03-20 NOTE — Telephone Encounter (Signed)
Pt aware.

## 2015-03-21 DIAGNOSIS — E119 Type 2 diabetes mellitus without complications: Secondary | ICD-10-CM | POA: Diagnosis not present

## 2015-03-21 DIAGNOSIS — H401133 Primary open-angle glaucoma, bilateral, severe stage: Secondary | ICD-10-CM | POA: Diagnosis not present

## 2015-03-21 DIAGNOSIS — H02839 Dermatochalasis of unspecified eye, unspecified eyelid: Secondary | ICD-10-CM | POA: Diagnosis not present

## 2015-03-21 DIAGNOSIS — Z961 Presence of intraocular lens: Secondary | ICD-10-CM | POA: Diagnosis not present

## 2015-03-22 ENCOUNTER — Telehealth: Payer: Self-pay | Admitting: Family Medicine

## 2015-03-22 MED ORDER — HYDROXYZINE HCL 25 MG PO TABS: 25.0000 mg | ORAL_TABLET | Freq: Three times a day (TID) | ORAL | Status: DC | PRN

## 2015-03-22 NOTE — Telephone Encounter (Signed)
C/o generalized itch, no rash or shortness of breath

## 2015-03-29 ENCOUNTER — Other Ambulatory Visit: Payer: Self-pay | Admitting: Family Medicine

## 2015-04-17 ENCOUNTER — Other Ambulatory Visit: Payer: Self-pay | Admitting: Family Medicine

## 2015-04-21 DIAGNOSIS — M545 Low back pain: Secondary | ICD-10-CM | POA: Diagnosis not present

## 2015-04-21 DIAGNOSIS — M25561 Pain in right knee: Secondary | ICD-10-CM | POA: Diagnosis not present

## 2015-04-21 DIAGNOSIS — M1712 Unilateral primary osteoarthritis, left knee: Secondary | ICD-10-CM | POA: Diagnosis not present

## 2015-04-22 ENCOUNTER — Other Ambulatory Visit: Payer: Self-pay | Admitting: Family Medicine

## 2015-04-24 ENCOUNTER — Encounter: Payer: Self-pay | Admitting: Family Medicine

## 2015-04-24 ENCOUNTER — Ambulatory Visit (INDEPENDENT_AMBULATORY_CARE_PROVIDER_SITE_OTHER): Payer: Commercial Managed Care - HMO | Admitting: Family Medicine

## 2015-04-24 VITALS — BP 146/94 | HR 78 | Resp 18 | Ht 67.0 in | Wt 218.0 lb

## 2015-04-24 DIAGNOSIS — R413 Other amnesia: Secondary | ICD-10-CM

## 2015-04-24 DIAGNOSIS — J302 Other seasonal allergic rhinitis: Secondary | ICD-10-CM | POA: Diagnosis not present

## 2015-04-24 DIAGNOSIS — N3000 Acute cystitis without hematuria: Secondary | ICD-10-CM

## 2015-04-24 DIAGNOSIS — I10 Essential (primary) hypertension: Secondary | ICD-10-CM

## 2015-04-24 DIAGNOSIS — Z23 Encounter for immunization: Secondary | ICD-10-CM | POA: Diagnosis not present

## 2015-04-24 DIAGNOSIS — K219 Gastro-esophageal reflux disease without esophagitis: Secondary | ICD-10-CM

## 2015-04-24 DIAGNOSIS — E119 Type 2 diabetes mellitus without complications: Secondary | ICD-10-CM

## 2015-04-24 DIAGNOSIS — F418 Other specified anxiety disorders: Secondary | ICD-10-CM

## 2015-04-24 LAB — POCT URINALYSIS DIPSTICK
BILIRUBIN UA: NEGATIVE
Blood, UA: NEGATIVE
GLUCOSE UA: NEGATIVE
KETONES UA: NEGATIVE
Nitrite, UA: NEGATIVE
SPEC GRAV UA: 1.02
Urobilinogen, UA: 0.2
pH, UA: 7

## 2015-04-24 MED ORDER — HYDROXYZINE HCL 25 MG PO TABS: 25.0000 mg | ORAL_TABLET | Freq: Three times a day (TID) | ORAL | Status: DC | PRN

## 2015-04-24 MED ORDER — TEMAZEPAM 30 MG PO CAPS: 30.0000 mg | ORAL_CAPSULE | Freq: Every day | ORAL | Status: DC

## 2015-04-24 MED ORDER — BUSPIRONE HCL 5 MG PO TABS: 5.0000 mg | ORAL_TABLET | Freq: Three times a day (TID) | ORAL | Status: DC

## 2015-04-24 NOTE — Progress Notes (Signed)
Subjective:    Patient ID: [name], female    DOB: 05/26/1940, 75 y.o.   MRN: [id number]  HPI Pt in with her sister due to concerns of increased anxiety and nervousness. She has stopped the paxil which had been prescribed in the past, but they both feels she will benefit from medciation again Denies fever, cough chills or runny nose , wants flu shot today Recently saw ortho re knee and back pain and injection given. C/o increased urinary frequency in past week, no flank pain , fever or chills [name] will be out of town next week, and is ho[ping t get [name] straightened out before she leaves Increased anxiety since spouse recently hospitalized, overwhelmed, requests help with learning to check her blood sugars also , possibly with medicatin management at home, will ask [facility] to do home visit     Review of Systems See HPI Denies recent fever or chills. Denies sinus pressure, nasal congestion, ear pain or sore throat. Denies chest congestion, productive cough or wheezing. Denies chest pains, palpitations and leg swelling Denies abdominal pain, nausea, vomiting,diarrhea or constipation.   Denies dysuria, frequency, hesitancy or incontinence.  Denies headaches, seizures, numbness, or tingling.  Denies skin break down or rash.        Objective:   Physical Exam BP 146/94 mmHg  Pulse 78  Resp 18  Ht 5\' 7"  (1.702 m)  Wt 218 lb (98.884 kg)  BMI 34.14 kg/m2  SpO2 96% Patient alert and oriented and in no cardiopulmonary distress.  HEENT: No facial asymmetry, EOMI,   oropharynx pink and moist.  Neck supple no JVD, no mass.  Chest: Clear to auscultation bilaterally.  CVS: S1, S2 no murmurs, no S3.Regular rate.  ABD: Soft non tender.   Ext: No edema  MS: decreased ROM spine, hips an knees  Skin: Intact, no ulcerations or rash noted.  Psych: Good eye contact, normal affect. Memory impaired , mildly anxious and depressed appearing  CNS: CN 2-12 intact, power,   normal throughout.no focal deficits noted.        Assessment & Plan:  Acute cystitis without hematuria No fever , chills or flank pain will await c/s prior to treatment Infection cofirmed , with multiresistance Will notifiy and treat approperiately  HTN (hypertension) Elevated at visit, pt visibly anxious and distressed, no medication changes at this visit Low salt diet and inxcrease in vegetable and fruit encouraged  Seasonal allergies Controlled, no change in medication   Gastroesophageal reflux disease Controlled, no change in medication   Type 2 diabetes mellitus with HbA1C goal below 8.0 Updated lab needed at/ before next visit. [name] is reminded of the importance of commitment to daily physical activity for 30 minutes or more, as able and the need to limit carbohydrate intake to 30 to 60 grams per meal to help with blood sugar control.   The need to take medication as prescribed, test blood sugar as directed, and to call between visits if there is a concern that blood sugar is uncontrolled is also discussed.   [name] is reminded of the importance of daily foot exam, annual eye examination, and good blood sugar, blood pressure and cholesterol control.  Diabetic Labs Latest Ref Rng 09/03/2014 07/09/2014 06/06/2014 11/15/2013 04/19/2013  HbA1c <5.7 % 7.5(H) - 7.8(H) 6.8(H) 6.7(H)  Microalbumin <2.0 mg/dL 4.5(H) - - 12.0(H) -  Micro/Creat Ratio 0.0 - 30.0 mg/g 16.2 - - 29.8 -  Chol 0 - 200 mg/dL 145 - - 161 164  HDL >=46 mg/dL 61 - - 60 68  Calc LDL 0 - 99 mg/dL 56 - - 69 70  Triglycerides <150 mg/dL 139 - - 162(H) 128  Creatinine 0.50 - 1.10 mg/dL 0.97 1.13(H) 0.87 1.17(H) 1.13(H)   BP/Weight 04/24/2015 03/07/2015 11/18/2014 10/31/2014 09/03/2014 07/17/2014 [id number]  Systolic BP [id number]  Diastolic BP 94 90 90 86 78 74 87  Wt. (Lbs) 218 224 218 224 218 220.8 213  BMI 34.14 35.08 34.14 35.08 34.14 34.57 33.35   Foot/eye exam completion dates Latest Ref Rng  12/10/2014 11/15/2013  Eye Exam No Retinopathy No Retinopathy -  Foot exam Order - - -  Foot Form Completion - - Done      '   Depression with anxiety Uncontrolled, increased stress with ailing wife, needs assistance from community resources, family also supportive, will refer to [facility]  Memory loss continue aricept as before, stable on medication

## 2015-04-24 NOTE — Patient Instructions (Addendum)
Physical exam in May as before  Please resume buspar and restoril for anxiety and sleep, short course to be filled locally and also we will send to mail order  Hydroxyzine sent for itching, when nerves get calmer, you will itch less Use lotion regularly on skin to help dry skin and itch also   We will let you know if urine is infected  Fasting labs first week April  [facility] are referring you for case worker/ [facility]  To establish the care you will benefit from at home

## 2015-04-28 ENCOUNTER — Other Ambulatory Visit: Payer: Self-pay

## 2015-04-28 DIAGNOSIS — N3000 Acute cystitis without hematuria: Secondary | ICD-10-CM

## 2015-04-28 LAB — URINE CULTURE

## 2015-04-28 MED ORDER — NITROFURANTOIN MONOHYD MACRO 100 MG PO CAPS: 100.0000 mg | ORAL_CAPSULE | Freq: Two times a day (BID) | ORAL | Status: DC

## 2015-04-28 NOTE — Assessment & Plan Note (Signed)
Controlled, no change in medication  

## 2015-04-28 NOTE — Assessment & Plan Note (Signed)
No fever , chills or flank pain will await c/s prior to treatment Infection cofirmed , with multiresistance Will notifiy and treat approperiately

## 2015-04-28 NOTE — Assessment & Plan Note (Signed)
Elevated at visit, pt visibly anxious and distressed, no medication changes at this visit Low salt diet and inxcrease in vegetable and fruit encouraged

## 2015-04-28 NOTE — Assessment & Plan Note (Signed)
continue aricept as before, stable on medication

## 2015-04-28 NOTE — Assessment & Plan Note (Signed)
Updated lab needed at/ before next visit. [name] is reminded of the importance of commitment to daily physical activity for 30 minutes or more, as able and the need to limit carbohydrate intake to 30 to 60 grams per meal to help with blood sugar control.   The need to take medication as prescribed, test blood sugar as directed, and to call between visits if there is a concern that blood sugar is uncontrolled is also discussed.   [name] is reminded of the importance of daily foot exam, annual eye examination, and good blood sugar, blood pressure and cholesterol control.  Diabetic Labs Latest Ref Rng 09/03/2014 07/09/2014 06/06/2014 11/15/2013 04/19/2013  HbA1c <5.7 % 7.5(H) - 7.8(H) 6.8(H) 6.7(H)  Microalbumin <2.0 mg/dL 4.5(H) - - 12.0(H) -  Micro/Creat Ratio 0.0 - 30.0 mg/g 16.2 - - 29.8 -  Chol 0 - 200 mg/dL 145 - - 161 164  HDL >=46 mg/dL 61 - - 60 68  Calc LDL 0 - 99 mg/dL 56 - - 69 70  Triglycerides <150 mg/dL 139 - - 162(H) 128  Creatinine 0.50 - 1.10 mg/dL 0.97 1.13(H) 0.87 1.17(H) 1.13(H)   BP/Weight 04/24/2015 03/07/2015 11/18/2014 10/31/2014 09/03/2014 07/17/2014 [id number]  Systolic BP 123456 Q000111Q 123456 0000000 0000000 [id number]  Diastolic BP 94 90 90 86 78 74 87  Wt. (Lbs) 218 224 218 224 218 220.8 213  BMI 34.14 35.08 34.14 35.08 34.14 34.57 33.35   Foot/eye exam completion dates Latest Ref Rng 12/10/2014 11/15/2013  Eye Exam No Retinopathy No Retinopathy -  Foot exam Order - - -  Foot Form Completion - - Done      '

## 2015-04-28 NOTE — Assessment & Plan Note (Signed)
Uncontrolled, increased stress with ailing wife, needs assistance from community resources, family also supportive, will refer to [facility]

## 2015-05-02 DIAGNOSIS — M545 Low back pain: Secondary | ICD-10-CM | POA: Diagnosis not present

## 2015-05-05 DIAGNOSIS — M5416 Radiculopathy, lumbar region: Secondary | ICD-10-CM | POA: Diagnosis not present

## 2015-05-05 DIAGNOSIS — M545 Low back pain: Secondary | ICD-10-CM | POA: Diagnosis not present

## 2015-05-05 DIAGNOSIS — M4806 Spinal stenosis, lumbar region: Secondary | ICD-10-CM | POA: Diagnosis not present

## 2015-05-16 ENCOUNTER — Telehealth: Payer: Self-pay | Admitting: Family Medicine

## 2015-05-16 DIAGNOSIS — Z1159 Encounter for screening for other viral diseases: Secondary | ICD-10-CM

## 2015-05-16 DIAGNOSIS — R300 Dysuria: Secondary | ICD-10-CM

## 2015-05-16 NOTE — Telephone Encounter (Signed)
States she's still having the same symptoms of uti, dysuria and frequency. She did finish all the macrobid. Do you want her to go to the lab to submit a UA with reflex culture?

## 2015-05-16 NOTE — Addendum Note (Signed)
Addended by: Eual Fines on: 05/16/2015 11:53 AM   Modules accepted: Orders

## 2015-05-16 NOTE — Telephone Encounter (Signed)
Patient is stating that the antibiotic for her UTI is not working and she is needs something else

## 2015-05-16 NOTE — Telephone Encounter (Signed)
[name] is aware and will have her go do the UA and fasting labs

## 2015-05-16 NOTE — Telephone Encounter (Signed)
Yes pls, also ha pas due blood test, WAY past due, pls get those and add hSV2 to test. Explain may also be due to vaginal dryness from aging and may benefit from low dose topical cram (estrrace) but will NOT prescribe till current labs have been reported on

## 2015-05-19 DIAGNOSIS — E119 Type 2 diabetes mellitus without complications: Secondary | ICD-10-CM | POA: Diagnosis not present

## 2015-05-19 DIAGNOSIS — R300 Dysuria: Secondary | ICD-10-CM | POA: Diagnosis not present

## 2015-05-19 DIAGNOSIS — M545 Low back pain: Secondary | ICD-10-CM | POA: Diagnosis not present

## 2015-05-19 DIAGNOSIS — Z1159 Encounter for screening for other viral diseases: Secondary | ICD-10-CM | POA: Diagnosis not present

## 2015-05-19 DIAGNOSIS — E785 Hyperlipidemia, unspecified: Secondary | ICD-10-CM | POA: Diagnosis not present

## 2015-05-19 DIAGNOSIS — M4806 Spinal stenosis, lumbar region: Secondary | ICD-10-CM | POA: Diagnosis not present

## 2015-05-20 ENCOUNTER — Other Ambulatory Visit: Payer: Self-pay | Admitting: Family Medicine

## 2015-05-20 LAB — LIPID PANEL
CHOL/HDL RATIO: 1.9 ratio (ref <=5.0)
Cholesterol: 148 mg/dL (ref 125–200)
HDL: 77 mg/dL (ref >=46)
LDL CALC: 46 mg/dL (ref <130)
TRIGLYCERIDES: 126 mg/dL (ref <150)
VLDL: 25 mg/dL (ref <30)

## 2015-05-20 LAB — COMPLETE METABOLIC PANEL WITH GFR
ALT: 14 U/L (ref 6–29)
AST: 13 U/L (ref 10–35)
Albumin: 4.4 g/dL (ref 3.6–5.1)
Alkaline Phosphatase: 113 U/L (ref 33–130)
BUN: 14 mg/dL (ref 7–25)
CHLORIDE: 103 mmol/L (ref 98–110)
CO2: 25 mmol/L (ref 20–31)
Calcium: 9.7 mg/dL (ref 8.6–10.4)
Creat: 1.11 mg/dL — ABNORMAL HIGH (ref 0.60–0.93)
GFR, Est African American: 56 mL/min — ABNORMAL LOW (ref >=60)
GFR, Est Non African American: 49 mL/min — ABNORMAL LOW (ref >=60)
GLUCOSE: 124 mg/dL — AB (ref 65–99)
POTASSIUM: 4 mmol/L (ref 3.5–5.3)
SODIUM: 139 mmol/L (ref 135–146)
Total Bilirubin: 0.3 mg/dL (ref 0.2–1.2)
Total Protein: 7.4 g/dL (ref 6.1–8.1)

## 2015-05-20 LAB — URINALYSIS W MICROSCOPIC + REFLEX CULTURE
BILIRUBIN URINE: NEGATIVE
CRYSTALS: NONE SEEN [HPF]
Casts: NONE SEEN [LPF]
Glucose, UA: NEGATIVE
Hgb urine dipstick: NEGATIVE
KETONES UR: NEGATIVE
Nitrite: NEGATIVE
RBC / HPF: NONE SEEN RBC/HPF (ref <=2)
Specific Gravity, Urine: 1.021 (ref 1.001–1.035)
Yeast: NONE SEEN [HPF]
pH: 6 (ref 5.0–8.0)

## 2015-05-20 LAB — HEMOGLOBIN A1C
HEMOGLOBIN A1C: 7.6 % — AB (ref <5.7)
MEAN PLASMA GLUCOSE: 171 mg/dL

## 2015-05-20 LAB — HSV 2 ANTIBODY, IGG: HSV 2 GLYCOPROTEIN G AB, IGG: 12.1 {index} — AB (ref <0.90)

## 2015-05-21 ENCOUNTER — Other Ambulatory Visit: Payer: Self-pay

## 2015-05-21 MED ORDER — ACYCLOVIR 400 MG PO TABS: 400.0000 mg | ORAL_TABLET | Freq: Three times a day (TID) | ORAL | Status: DC

## 2015-05-24 LAB — URINE CULTURE: Colony Count: >=100000

## 2015-05-26 ENCOUNTER — Other Ambulatory Visit: Payer: Self-pay | Admitting: Family Medicine

## 2015-05-26 ENCOUNTER — Other Ambulatory Visit: Payer: Self-pay

## 2015-05-26 ENCOUNTER — Telehealth: Payer: Self-pay

## 2015-05-26 ENCOUNTER — Encounter: Payer: Self-pay | Admitting: Family Medicine

## 2015-05-26 MED ORDER — TEMAZEPAM 30 MG PO CAPS: 30.0000 mg | ORAL_CAPSULE | Freq: Every evening | ORAL | Status: DC | PRN

## 2015-05-26 NOTE — Telephone Encounter (Signed)
Medication sent to pharmacy  

## 2015-05-26 NOTE — Telephone Encounter (Signed)
Noted, pls fax script for restoril and let her know

## 2015-05-29 ENCOUNTER — Ambulatory Visit (INDEPENDENT_AMBULATORY_CARE_PROVIDER_SITE_OTHER): Payer: Commercial Managed Care - HMO | Admitting: Family Medicine

## 2015-05-29 ENCOUNTER — Encounter: Payer: Self-pay | Admitting: Family Medicine

## 2015-05-29 VITALS — BP 134/86 | HR 84 | Resp 16 | Ht 67.0 in | Wt 222.0 lb

## 2015-05-29 DIAGNOSIS — R350 Frequency of micturition: Secondary | ICD-10-CM | POA: Diagnosis not present

## 2015-05-29 DIAGNOSIS — G43109 Migraine with aura, not intractable, without status migrainosus: Secondary | ICD-10-CM | POA: Diagnosis not present

## 2015-05-29 DIAGNOSIS — I10 Essential (primary) hypertension: Secondary | ICD-10-CM

## 2015-05-29 DIAGNOSIS — Z1211 Encounter for screening for malignant neoplasm of colon: Secondary | ICD-10-CM | POA: Diagnosis not present

## 2015-05-29 DIAGNOSIS — R51 Headache: Secondary | ICD-10-CM

## 2015-05-29 DIAGNOSIS — E119 Type 2 diabetes mellitus without complications: Secondary | ICD-10-CM

## 2015-05-29 DIAGNOSIS — Z0000 Encounter for general adult medical examination without abnormal findings: Secondary | ICD-10-CM | POA: Diagnosis not present

## 2015-05-29 DIAGNOSIS — N3000 Acute cystitis without hematuria: Secondary | ICD-10-CM

## 2015-05-29 DIAGNOSIS — R519 Headache, unspecified: Secondary | ICD-10-CM

## 2015-05-29 DIAGNOSIS — N390 Urinary tract infection, site not specified: Secondary | ICD-10-CM | POA: Insufficient documentation

## 2015-05-29 LAB — POCT URINALYSIS DIPSTICK
Bilirubin, UA: NEGATIVE
Blood, UA: NEGATIVE
GLUCOSE UA: NEGATIVE
KETONES UA: NEGATIVE
Nitrite, UA: NEGATIVE
PROTEIN UA: NEGATIVE
Spec Grav, UA: 1.01
Urobilinogen, UA: 0.2
pH, UA: 7

## 2015-05-29 LAB — POC HEMOCCULT BLD/STL (OFFICE/1-CARD/DIAGNOSTIC): Fecal Occult Blood, POC: NEGATIVE

## 2015-05-29 MED ORDER — ACYCLOVIR 400 MG PO TABS: 400.0000 mg | ORAL_TABLET | Freq: Three times a day (TID) | ORAL | Status: DC

## 2015-05-29 MED ORDER — FLUCONAZOLE 150 MG PO TABS: ORAL_TABLET | ORAL | Status: DC

## 2015-05-29 MED ORDER — CIPROFLOXACIN HCL 500 MG PO TABS: 500.0000 mg | ORAL_TABLET | Freq: Two times a day (BID) | ORAL | Status: DC

## 2015-05-29 MED ORDER — SUMATRIPTAN 5 MG/ACT NA SOLN: 1.0000 | NASAL | Status: DC | PRN

## 2015-05-29 MED ORDER — NITROFURANTOIN MACROCRYSTAL 100 MG PO CAPS: 100.0000 mg | ORAL_CAPSULE | Freq: Two times a day (BID) | ORAL | Status: DC

## 2015-05-29 NOTE — Patient Instructions (Signed)
Annual wellness in 4 month, call if you need me sooner  Two antibiotics sent for infection and fluconazole one tablet if you itch after antibiotic for bladder infection  You have intranasal immitrex prescribed  You are referred to Dr Harl Bowie Non fast HBa1C, chem 7 and EGFR in 5 month  Thank you  for choosing [facility]. We consider it a privelige to serve you.  Delivering excellent health care in a caring and  compassionate way is our goal.  Partnering with you,  so that together we can achieve this goal is our strategy.

## 2015-05-29 NOTE — Assessment & Plan Note (Signed)
Uncontrolled migraine headaches, requests intranasal imitrex, states this was more beneficial  Will prescribe

## 2015-05-29 NOTE — Assessment & Plan Note (Signed)
Uncontrolled, request spray

## 2015-05-29 NOTE — Assessment & Plan Note (Signed)
Treat with 5 day course of cipro and doxycyline, fluconazole 1 tab also prescribed

## 2015-05-29 NOTE — Progress Notes (Signed)
   Subjective:    Patient ID: [name], female    DOB: 07-06-1940, 75 y.o.   MRN: [id number]  HPI Patient is in for annual physical exam. C/o uncontrolled headaches, wants imitrex spray back. C/o urinary frequency, has infection which needs treatment Recent labs, if available are reviewed. Immunization is reviewed , and  updated if needed.    Review of Systems    See HPI   Objective:   Physical Exam BP 134/86 mmHg  Pulse 84  Resp 16  Ht 5\' 7"  (1.702 m)  Wt 222 lb (100.699 kg)  BMI 34.76 kg/m2  SpO2 97%   Pleasant well nourished female, alert and oriented x 3, in no cardio-pulmonary distress. Afebrile. HEENT No facial trauma or asymetry. Sinuses non tender.  Extra occullar muscles intact External ears normal, tympanic membranes clear. Oropharynx moist, no exudate, fair  dentition. Neck: supple, no adenopathy,JVD or thyromegaly.No bruits.  Chest: Clear to ascultation bilaterally.No crackles or wheezes. Non tender to palpation  Breast: No asymetry,no masses or lumps. No tenderness. No nipple discharge or inversion. No axillary or supraclavicular adenopathy  Cardiovascular system; Heart sounds normal,  S1 and  S2 ,no S3.  No murmur, or thrill. Apical beat not displaced Peripheral pulses normal.  Abdomen: Soft, non tender, no organomegaly or masses. No bruits. Bowel sounds normal. No guarding, tenderness or rebound.  Rectal:  Normal sphincter tone. No mass.No rectal masses.  Guaiac negative stool.  GU: External genitalia normal female genitalia , female distribution of hair. No lesions. Urethral meatus normal in size, no  Prolapse, no lesions visibly  Present. Bladder non tender. Vagina pink and moist , with no visible lesions , discharge present . Adequate pelvic support no  cystocele or rectocele noted Uterus atrophic,Cervix pink , no adnexal masses, no cervical motion or adnexal tenderness.   Musculoskeletal exam: Full ROM of spine, hips ,  shoulders and knees. No deformity ,swelling or crepitus noted. No muscle wasting or atrophy.   Neurologic: Cranial nerves 2 to 12 intact. Power, tone ,sensation and reflexes normal throughout. No disturbance in gait. No tremor.  Skin: Intact, no ulceration, erythema , scaling or rash noted.skin tag noted on posterior right thigh. Pigmentation normal throughout  Psych; Normal mood and affect. Judgement and concentration normal        Assessment & Plan:  Annual physical exam Annual exam as documented.  Immunization and cancer screening needs are specifically addressed at this visit.   Migraine Uncontrolled, request spray  Headache disorder Uncontrolled migraine headaches, requests intranasal imitrex, states this was more beneficial  Will prescribe  UTI (urinary tract infection) Treat with 5 day course of cipro and doxycyline, fluconazole 1 tab also prescribed

## 2015-05-29 NOTE — Assessment & Plan Note (Signed)
Annual exam as documented. . Immunization and cancer screening needs are specifically addressed at this visit.  

## 2015-05-30 ENCOUNTER — Other Ambulatory Visit: Payer: Self-pay | Admitting: Family Medicine

## 2015-06-18 ENCOUNTER — Encounter: Payer: Self-pay | Admitting: Family Medicine

## 2015-08-01 ENCOUNTER — Encounter (INDEPENDENT_AMBULATORY_CARE_PROVIDER_SITE_OTHER): Payer: Self-pay

## 2015-08-01 ENCOUNTER — Ambulatory Visit (INDEPENDENT_AMBULATORY_CARE_PROVIDER_SITE_OTHER): Payer: Commercial Managed Care - HMO | Admitting: Family Medicine

## 2015-08-01 ENCOUNTER — Encounter: Payer: Self-pay | Admitting: Family Medicine

## 2015-08-01 VITALS — BP 110/70 | HR 86 | Resp 18 | Ht 67.0 in | Wt 221.0 lb

## 2015-08-01 DIAGNOSIS — R05 Cough: Secondary | ICD-10-CM

## 2015-08-01 DIAGNOSIS — E119 Type 2 diabetes mellitus without complications: Secondary | ICD-10-CM

## 2015-08-01 DIAGNOSIS — D509 Iron deficiency anemia, unspecified: Secondary | ICD-10-CM

## 2015-08-01 DIAGNOSIS — M5441 Lumbago with sciatica, right side: Secondary | ICD-10-CM

## 2015-08-01 DIAGNOSIS — N3946 Mixed incontinence: Secondary | ICD-10-CM | POA: Diagnosis not present

## 2015-08-01 DIAGNOSIS — J302 Other seasonal allergic rhinitis: Secondary | ICD-10-CM

## 2015-08-01 DIAGNOSIS — J209 Acute bronchitis, unspecified: Secondary | ICD-10-CM | POA: Diagnosis not present

## 2015-08-01 DIAGNOSIS — I10 Essential (primary) hypertension: Secondary | ICD-10-CM | POA: Diagnosis not present

## 2015-08-01 DIAGNOSIS — R053 Chronic cough: Secondary | ICD-10-CM

## 2015-08-01 DIAGNOSIS — R519 Headache, unspecified: Secondary | ICD-10-CM

## 2015-08-01 DIAGNOSIS — R51 Headache: Secondary | ICD-10-CM

## 2015-08-01 MED ORDER — PREDNISONE 5 MG PO TABS: 5.0000 mg | ORAL_TABLET | Freq: Two times a day (BID) | ORAL | Status: AC

## 2015-08-01 MED ORDER — KETOROLAC TROMETHAMINE 60 MG/2ML IM SOLN
60.0000 mg | Freq: Once | INTRAMUSCULAR | Status: AC
  Administered 2015-08-01: 60 mg via INTRAMUSCULAR

## 2015-08-01 MED ORDER — TIZANIDINE HCL 2 MG PO TABS: 2.0000 mg | ORAL_TABLET | Freq: Every day | ORAL | Status: DC

## 2015-08-01 MED ORDER — METHYLPREDNISOLONE ACETATE 80 MG/ML IJ SUSP
80.0000 mg | Freq: Once | INTRAMUSCULAR | Status: AC
  Administered 2015-08-01: 80 mg via INTRAMUSCULAR

## 2015-08-01 MED ORDER — HYDROCOD POLST-CPM POLST ER 10-8 MG/5ML PO SUER: 5.0000 mL | Freq: Every evening | ORAL | Status: DC | PRN

## 2015-08-01 NOTE — Assessment & Plan Note (Addendum)
Uncontrolled.Toradol and depo medrol administered IM in the office , to be followed by a short course of oral prednisone  Also add bedtime muscle relaxant

## 2015-08-01 NOTE — Patient Instructions (Addendum)
Annual wellness in 6 weeks , call if you need me sooner  Injections and 5 day course of prednisone for 5 days sent  Do NOT RESUME THE Pradaxa for the next 1 weeks PLEASE  New for back spasm, and to help with sleep is  Zanaflex one at night  NON fast labs, HBA1C, cmp and EGFR, CBC, TSH and microalb in 6 weeks, 3 to 5 days before visist if able please  CONGRATS on all good things in your LIFE!!!  I will send cough syrup once clarified with the nurse due to expense  Thanks for choosing  Primary Care, we consider it a privelige to serve you. Urine specimen to lab for culture since you were unable to produce urine

## 2015-08-01 NOTE — Progress Notes (Signed)
Subjective:    Patient ID: [name], female    DOB: 10/27/1940, 75 y.o.   MRN: [id number]  HPI    [name]     MRN: [id number]      DOB: 02/22/1940   HPI [name] is here for follow up and re-evaluation of chronic medical conditions, medication management and review of any available recent lab and radiology data.  Preventive health is updated, specifically  Cancer screening and Immunization.   Questions or concerns regarding consultations or procedures which the PT has had in the interim are  Addressed.States never wants another epidural The PT denies any adverse reactions to current medications since the last visit.  Pt discontinued pradaxa on her own , stating since she saw it on TV that it was causing death, she felt she should not use this , I advised with her h/o PE I would recommend esp since not very ambulatory, she is to resume in 1 week, wants injections for back pain C/o urinary frequency and pressure, denies fever , chills or flank pain C/o chronic cough and back spasm, both disturb her sleep  ROS Denies recent fever or chills. Denies sinus pressure, nasal congestion, ear pain or sore throat. Denies chest congestion, productive cough or wheezing. Denies chest pains, palpitations and leg swelling Denies abdominal pain, nausea, vomiting,diarrhea or constipation.    . Denies headaches, seizures, numbness, or tingling. Denies depression, anxiety . Denies skin break down or rash.   PE  BP 110/70 mmHg  Pulse 86  Resp 18  Ht 5\' 7"  (1.702 m)  Wt 221 lb (100.245 kg)  BMI 34.61 kg/m2  SpO2 94%  Patient alert and oriented and in no cardiopulmonary distress.  HEENT: No facial asymmetry, EOMI,   oropharynx pink and moist.  Neck supple no JVD, no mass.  Chest: adequate air entry bilaterally few basilar crackles, no wheezes  CVS: S1, S2 no murmurs, no S3.Regular rate.  ABD: Soft non tender. No suprapubic or renal angle tenderness  Ext: No edema  MS:  decreased ROM lumbar  spine, palpable lumbar spasm,reuced ROM right  hip and also in  knees.  Skin: Intact, no ulcerations or rash noted.  Psych: Good eye contact, normal affect. Memory impaired not anxious or depressed appearing.  CNS: CN 2-12 intact, power,  normal throughout.no focal deficits noted.   Assessment & Plan   Right-sided low back pain with right-sided sciatica Uncontrolled.Toradol and depo medrol administered IM in the office , to be followed by a short course of oral prednisone  Also add bedtime muscle relaxant   HTN (hypertension) Controlled, no change in medication DASH diet and commitment to daily physical activity for a minimum of 30 minutes discussed and encouraged, as a part of hypertension management. The importance of attaining a healthy weight is also discussed.  BP/Weight 08/01/2015 05/29/2015 04/24/2015 03/07/2015 11/18/2014 10/31/2014 [id number]  Systolic BP A999333 Q000111Q 123456 Q000111Q 123456 0000000 0000000  Diastolic BP 70 86 94 90 90 86 78  Wt. (Lbs) 221 222 218 224 218 224 218  BMI 34.61 34.76 34.14 35.08 34.14 35.08 34.14        Type 2 diabetes mellitus with HbA1C goal below 8.0 [name] is reminded of the importance of commitment to daily physical activity for 30 minutes or more, as able and the need to limit carbohydrate intake to 30 to 60 grams per meal to help with blood sugar control.   The need to take medication as prescribed, test blood  sugar as directed, and to call between visits if there is a concern that blood sugar is uncontrolled is also discussed.   [name] is reminded of the importance of daily foot exam, annual eye examination, and good blood sugar, blood pressure and cholesterol control.  Deteriorated  Updated lab needed at/ before next visit.  Diabetic Labs Latest Ref Rng 05/19/2015 09/03/2014 07/09/2014 06/06/2014 11/15/2013  HbA1c <5.7 % 7.6(H) 7.5(H) - 7.8(H) 6.8(H)  Microalbumin <2.0 mg/dL - 4.5(H) - - 12.0(H)  Micro/Creat Ratio 0.0 - 30.0 mg/g - 16.2  - - 29.8  Chol 125 - 200 mg/dL 148 145 - - 161  HDL >=46 mg/dL 77 61 - - 60  Calc LDL <130 mg/dL 46 56 - - 69  Triglycerides <150 mg/dL 126 139 - - 162(H)  Creatinine 0.60 - 0.93 mg/dL 1.11(H) 0.97 1.13(H) 0.87 1.17(H)   BP/Weight 08/01/2015 05/29/2015 04/24/2015 03/07/2015 11/18/2014 10/31/2014 [id number]  Systolic BP A999333 Q000111Q 123456 Q000111Q 123456 0000000 0000000  Diastolic BP 70 86 94 90 90 86 78  Wt. (Lbs) 221 222 218 224 218 224 218  BMI 34.61 34.76 34.14 35.08 34.14 35.08 34.14   Foot/eye exam completion dates Latest Ref Rng 05/29/2015 12/10/2014  Eye Exam No Retinopathy - No Retinopathy  Foot exam Order - - -  Foot Form Completion - Done -         Seasonal allergies Uncontrolled with chronic nocturnal cough, cough suppressant prescribed  Headache disorder Controlled, no change in medication        Review of Systems     Objective:   Physical Exam        Assessment & Plan:

## 2015-08-03 NOTE — Assessment & Plan Note (Signed)
Controlled, no change in medication DASH diet and commitment to daily physical activity for a minimum of 30 minutes discussed and encouraged, as a part of hypertension management. The importance of attaining a healthy weight is also discussed.  BP/Weight 08/01/2015 05/29/2015 04/24/2015 03/07/2015 11/18/2014 10/31/2014 [id number]  Systolic BP A999333 Q000111Q 123456 Q000111Q 123456 0000000 0000000  Diastolic BP 70 86 94 90 90 86 78  Wt. (Lbs) 221 222 218 224 218 224 218  BMI 34.61 34.76 34.14 35.08 34.14 35.08 34.14

## 2015-08-03 NOTE — Assessment & Plan Note (Signed)
Controlled, no change in medication  

## 2015-08-03 NOTE — Assessment & Plan Note (Signed)
[name] is reminded of the importance of commitment to daily physical activity for 30 minutes or more, as able and the need to limit carbohydrate intake to 30 to 60 grams per meal to help with blood sugar control.   The need to take medication as prescribed, test blood sugar as directed, and to call between visits if there is a concern that blood sugar is uncontrolled is also discussed.   [name] is reminded of the importance of daily foot exam, annual eye examination, and good blood sugar, blood pressure and cholesterol control.  Deteriorated  Updated lab needed at/ before next visit.  Diabetic Labs Latest Ref Rng 05/19/2015 09/03/2014 07/09/2014 06/06/2014 11/15/2013  HbA1c <5.7 % 7.6(H) 7.5(H) - 7.8(H) 6.8(H)  Microalbumin <2.0 mg/dL - 4.5(H) - - 12.0(H)  Micro/Creat Ratio 0.0 - 30.0 mg/g - 16.2 - - 29.8  Chol 125 - 200 mg/dL 148 145 - - 161  HDL >=46 mg/dL 77 61 - - 60  Calc LDL <130 mg/dL 46 56 - - 69  Triglycerides <150 mg/dL 126 139 - - 162(H)  Creatinine 0.60 - 0.93 mg/dL 1.11(H) 0.97 1.13(H) 0.87 1.17(H)   BP/Weight 08/01/2015 05/29/2015 04/24/2015 03/07/2015 11/18/2014 10/31/2014 [id number]  Systolic BP A999333 Q000111Q 123456 Q000111Q 123456 0000000 0000000  Diastolic BP 70 86 94 90 90 86 78  Wt. (Lbs) 221 222 218 224 218 224 218  BMI 34.61 34.76 34.14 35.08 34.14 35.08 34.14   Foot/eye exam completion dates Latest Ref Rng 05/29/2015 12/10/2014  Eye Exam No Retinopathy - No Retinopathy  Foot exam Order - - -  Foot Form Completion - Done -

## 2015-08-03 NOTE — Assessment & Plan Note (Signed)
Uncontrolled with chronic nocturnal cough, cough suppressant prescribed

## 2015-08-06 ENCOUNTER — Encounter: Payer: Self-pay | Admitting: Family Medicine

## 2015-08-07 ENCOUNTER — Other Ambulatory Visit: Payer: Self-pay

## 2015-08-07 ENCOUNTER — Other Ambulatory Visit: Payer: Self-pay | Admitting: Family Medicine

## 2015-08-07 MED ORDER — CLONIDINE HCL 0.1 MG PO TABS: ORAL_TABLET | ORAL | Status: DC

## 2015-08-08 ENCOUNTER — Other Ambulatory Visit: Payer: Self-pay | Admitting: Family Medicine

## 2015-08-08 ENCOUNTER — Other Ambulatory Visit: Payer: Self-pay | Admitting: Cardiology

## 2015-08-08 LAB — URINE CULTURE: Colony Count: >=100000

## 2015-08-08 MED ORDER — NITROFURANTOIN MACROCRYSTAL 100 MG PO CAPS: 100.0000 mg | ORAL_CAPSULE | Freq: Two times a day (BID) | ORAL | Status: DC

## 2015-08-19 ENCOUNTER — Emergency Department (HOSPITAL_COMMUNITY): Payer: Commercial Managed Care - HMO

## 2015-08-19 ENCOUNTER — Encounter (HOSPITAL_COMMUNITY): Payer: Self-pay | Admitting: Emergency Medicine

## 2015-08-19 ENCOUNTER — Emergency Department (HOSPITAL_COMMUNITY)
Admission: EM | Admit: 2015-08-19 | Discharge: 2015-08-19 | Disposition: A | Discharge status: Home / Self Care | Payer: Commercial Managed Care - HMO | Attending: Emergency Medicine | Admitting: Emergency Medicine

## 2015-08-19 DIAGNOSIS — I10 Essential (primary) hypertension: Secondary | ICD-10-CM | POA: Insufficient documentation

## 2015-08-19 DIAGNOSIS — E785 Hyperlipidemia, unspecified: Secondary | ICD-10-CM | POA: Insufficient documentation

## 2015-08-19 DIAGNOSIS — F329 Major depressive disorder, single episode, unspecified: Secondary | ICD-10-CM | POA: Diagnosis not present

## 2015-08-19 DIAGNOSIS — M79661 Pain in right lower leg: Secondary | ICD-10-CM | POA: Diagnosis not present

## 2015-08-19 DIAGNOSIS — Z79899 Other long term (current) drug therapy: Secondary | ICD-10-CM | POA: Insufficient documentation

## 2015-08-19 DIAGNOSIS — M79604 Pain in right leg: Secondary | ICD-10-CM | POA: Diagnosis not present

## 2015-08-19 DIAGNOSIS — Z7984 Long term (current) use of oral hypoglycemic drugs: Secondary | ICD-10-CM | POA: Diagnosis not present

## 2015-08-19 DIAGNOSIS — M25551 Pain in right hip: Secondary | ICD-10-CM | POA: Insufficient documentation

## 2015-08-19 DIAGNOSIS — M79606 Pain in leg, unspecified: Secondary | ICD-10-CM

## 2015-08-19 DIAGNOSIS — E119 Type 2 diabetes mellitus without complications: Secondary | ICD-10-CM | POA: Diagnosis not present

## 2015-08-19 DIAGNOSIS — I4891 Unspecified atrial fibrillation: Secondary | ICD-10-CM | POA: Diagnosis not present

## 2015-08-19 MED ORDER — OXYCODONE-ACETAMINOPHEN 7.5-325 MG PO TABS: 1.0000 | ORAL_TABLET | Freq: Once | ORAL | Status: DC

## 2015-08-19 MED ORDER — OXYCODONE HCL 5 MG PO TABS
2.5000 mg | ORAL_TABLET | Freq: Once | ORAL | Status: AC
  Administered 2015-08-19: 2.5 mg via ORAL
  Filled 2015-08-19: qty 1

## 2015-08-19 MED ORDER — OXYCODONE-ACETAMINOPHEN 5-325 MG PO TABS
1.0000 | ORAL_TABLET | Freq: Once | ORAL | Status: AC
  Administered 2015-08-19: 1 via ORAL

## 2015-08-19 MED ORDER — HYDROCODONE-ACETAMINOPHEN 5-325 MG PO TABS
1.0000 | ORAL_TABLET | Freq: Once | ORAL | Status: AC
  Administered 2015-08-19: 1 via ORAL
  Filled 2015-08-19: qty 1

## 2015-08-19 MED ORDER — OXYCODONE-ACETAMINOPHEN 5-325 MG PO TABS
ORAL_TABLET | ORAL | Status: DC
Start: 2015-08-19 — End: 2015-08-19
  Filled 2015-08-19: qty 1

## 2015-08-19 MED ORDER — OXYCODONE-ACETAMINOPHEN 5-325 MG PO TABS: 1.0000 | ORAL_TABLET | Freq: Four times a day (QID) | ORAL | Status: DC | PRN

## 2015-08-19 MED ORDER — CYCLOBENZAPRINE HCL 10 MG PO TABS: 10.0000 mg | ORAL_TABLET | Freq: Two times a day (BID) | ORAL | Status: DC | PRN

## 2015-08-19 NOTE — ED Provider Notes (Signed)
CSN: [id number]     Arrival date & time 08/19/15  1128 History  By signing my name below, I, Emmanuella Mensah, attest that this documentation has been prepared under the direction and in the presence of Forde Dandy, MD. Electronically Signed: Judithann Sauger, ED Scribe. 08/19/2015. 12:01 PM.    Chief Complaint  Patient presents with  . Hip Pain   The history is provided by the patient. No language interpreter was used.   HPI Comments: [name] is a 74 y.o. female with a hx of HLD, hypertension, and DM who presents to the Emergency Department complaining of gradually worsening moderate right hip pain that radiates down the lateral aspect of right leg to her right knee onset several months ago. She states that she has seen a provider for these symptoms and told it could be due to arthritis. No recent falls, injuries, or trauma. Pt was then sent to receive steroid injections by Ortho in her joints but pt did not return to finish the course of vaccines because she states that it was not providing any relief. Pt has a hx of PE and afib, and she is currently on Pradaxa. She adds that she has been complaint with her medication lately. She denies any fever, chills, night sweats, leg swelling, numbness, or weakness.    Past Medical History  Diagnosis Date  . Hyperlipidemia     takes Pravastatin daily  . Overweight(278.02)   . Depression   . Migraine   . Degenerative joint disease     Knees  . Glaucoma   . Pulmonary embolism [facility]) May 2012    acute presentation, bilateral PE  . Chronic anticoagulation 07/21/2010  . Anemia, iron deficiency 2012    Evaluated by Dr. Oneida Alar; H&H of 9.3/30.8 with Carbon in 10/2010; 3/3 positive Hemoccult cards in 07/2011  . Atrial fibrillation ([place])     takes Coumadin daily  . Hypertension     takes Hyzaar daily and Propranolol   . Pneumonia 1969    hx of  . Hx of migraines     last one about a yr ago;takes Topamax daily  . Joint pain   . Joint swelling    . Chronic back pain     related to knee pain  . History of gout     was on medication but taken off of several months ago  . Bruises easily     takes Coumadin  . Skin spots-aging   . Gastroesophageal reflux disease     takes Omeprazole daily  . Hx of colonic polyps   . Urinary frequency   . Nocturia   . History of blood transfusion 1969  . Diabetes mellitus     takes Metformin daily  . Glaucoma     uses eye drops at night  . Anxiety     was on Prozac for a couple of weeks  . Insomnia     takes Restoril prn  . Memory loss     takes donepezil  . Migraine    Past Surgical History  Procedure Laterality Date  . Tonsillectomy    . Dilation and curettage of uterus    . Urethral dilation    . Knee arthroscopy w/ meniscectomy  90's    Left  . Colonoscopy  Jan 2002; 2012    2002: Dr. Tamala Julian, ext. hemorrhoids, nl colon; 2012-adenomatous polyps, gastritis on EGD  . Upper gastrointestinal endoscopy    . Orif ankle fracture Right 90's  .  Givens capsule study  08/18/2011    Procedure: GIVENS CAPSULE STUDY;  Surgeon: Rogene Houston, MD;  Location: [facility];  Service: Endoscopy;  Laterality: N/A;  730  . Esophagogastroduodenoscopy  08/24/2011    ESOPHAGOGASTRODUODENOSCOPY; esophageal dilatation; Rogene Houston, MD;  . Total knee arthroplasty  10/20/2011    Procedure: TOTAL KNEE ARTHROPLASTY;  Surgeon: Ninetta Lights, MD;  Location: [facility];  Service: Orthopedics;  Laterality: Left;  . Cataract extraction w/phaco Right 04/11/2012    Procedure: CATARACT EXTRACTION PHACO AND INTRAOCULAR LENS PLACEMENT (IOC);  Surgeon: Elta Guadeloupe T. Gershon Crane, MD;  Location: [facility] ORS;  Service: Ophthalmology;  Laterality: Right;  CDE=9.61  . Cataract extraction w/phaco Left 12/26/2012    Procedure: CATARACT EXTRACTION PHACO AND INTRAOCULAR LENS PLACEMENT (IOC);  Surgeon: Elta Guadeloupe T. Gershon Crane, MD;  Location: [facility] OR[facility];  Service: Ophthalmology;  Laterality: Left;  CDE:7.74   Family History  Problem Relation Age of Onset  .  Diabetes Mother   . Hypertension Mother   . Arthritis Mother   . Heart failure Mother   . Leukemia Father   . Diabetes Sister   . Hypertension Sister   . Diabetes Brother   . Hypertension Brother   . Hypertension Brother   . Hypertension Brother   . Hypertension Brother   . Diabetes Brother   . Diabetes Brother   . Diabetes Brother   . Pulmonary embolism Sister   . Colon cancer Neg Hx   . Colon polyps Neg Hx   . Migraines Father   . Migraines Mother   . Migraines Sister   . Kidney failure Mother   . Kidney failure Brother   . Kidney failure Father    Social History  Substance Use Topics  . Smoking status: Never Smoker   . Smokeless tobacco: Never Used  . Alcohol Use: No   OB History    No data available     Review of Systems  10/14 systems reviewed and are negative other than those stated in the HPI   Allergies  Penicillins; Buspar; Fentanyl; Sulfonamide derivatives; and Codeine  Home Medications   Prior to Admission medications   Medication Sig Start Date End Date Taking? Authorizing Provider  amLODipine (NORVASC) 10 MG tablet TAKE 1 TABLET EVERY DAY 08/08/15  Yes [name], MD  atorvastatin (LIPITOR) 40 MG tablet TAKE 1 TABLET EVERY DAY 02/24/15  Yes Arnoldo Lenis, MD  busPIRone (BUSPAR) 5 MG tablet Take 1 tablet (5 mg total) by mouth 3 (three) times daily. 04/24/15  Yes [name], MD  cloNIDine (CATAPRES) 0.1 MG tablet TAKE 1 TABLET (0.1 MG TOTAL) BY MOUTH DAILY. 08/07/15  Yes [name], MD  donepezil (ARICEPT) 10 MG tablet TAKE 1 TABLET AT BEDTIME 08/08/15  Yes [name], MD  gabapentin (NEURONTIN) 300 MG capsule Take 300 mg by mouth at bedtime.   Yes Historical Provider, MD  imipramine (TOFRANIL) 50 MG tablet TAKE 2 TABLETS AT BEDTIME 08/08/15  Yes [name], MD  meclizine (ANTIVERT) 25 MG tablet TAKE 1 TABLET THREE TIMES DAILY AS NEEDED FOR DIZZINESS. 02/25/15  Yes [name], MD  metFORMIN (GLUCOPHAGE) 500 MG  tablet TAKE 1 TABLET TWICE DAILY WITH MEALS 08/08/15  Yes [name], MD  montelukast (SINGULAIR) 10 MG tablet TAKE 1 TABLET (10 MG TOTAL) BY MOUTH AT BEDTIME. 04/18/15  Yes [name], MD  omeprazole (PRILOSEC) 40 MG capsule TAKE 1 CAPSULE EVERY DAY 08/08/15  Yes [name],  MD  ondansetron (ZOFRAN) 4 MG tablet Take 4 mg by mouth 2 (two) times daily as needed for nausea or vomiting.   Yes Historical Provider, MD  PRADAXA 150 MG CAPS capsule TAKE 1 CAPSULE EVERY 12 HOURS 08/08/15  Yes Arnoldo Lenis, MD  propranolol (INDERAL) 80 MG tablet TAKE 1 TABLET EVERY DAY 08/08/15  Yes [name], MD  SUMAtriptan (IMITREX) 5 MG/ACT nasal spray Place 1 spray (5 mg total) into the nose every 2 (two) hours as needed for migraine. One spray into each nostril , may repeat once after 2 hours. No more than 4 sprays in a 24 hour period. No more than twice weekly 05/29/15  Yes [name], MD  temazepam (RESTORIL) 30 MG capsule Take 1 capsule (30 mg total) by mouth at bedtime as needed for sleep. 05/26/15  Yes [name], MD  topiramate (TOPAMAX) 100 MG tablet TAKE 1 TABLET TWICE DAILY 11/27/14  Yes [name], MD  acyclovir (ZOVIRAX) 400 MG tablet Take 1 tablet (400 mg total) by mouth 3 (three) times daily. Patient not taking: Reported on 08/19/2015 05/29/15   [name], MD  albuterol (PROVENTIL HFA;VENTOLIN HFA) 108 (90 Base) MCG/ACT inhaler Inhale 2 puffs into the lungs every 6 (six) hours as needed for wheezing or shortness of breath. Patient not taking: Reported on 08/19/2015 03/07/15   [name], MD  cyclobenzaprine (FLEXERIL) 10 MG tablet Take 1 tablet (10 mg total) by mouth 2 (two) times daily as needed for muscle spasms. 08/19/15   Forde Dandy, MD  nitrofurantoin (MACRODANTIN) 100 MG capsule Take 1 capsule (100 mg total) by mouth 2 (two) times daily. Patient not taking: Reported on 08/19/2015 08/08/15   [name], MD  oxyCODONE-acetaminophen  (PERCOCET/ROXICET) 5-325 MG tablet Take 1-2 tablets by mouth every 6 (six) hours as needed for severe pain. 08/19/15   Forde Dandy, MD  tiZANidine (ZANAFLEX) 2 MG tablet Take 1 tablet (2 mg total) by mouth at bedtime. Patient not taking: Reported on 08/19/2015 08/01/15   [name], MD   BP 134/79 mmHg  Pulse 68  Temp(Src) 97.8 F (36.6 C) (Oral)  Resp 18  Ht 5\' 7"  (1.702 m)  Wt 222 lb (100.699 kg)  BMI 34.76 kg/m2  SpO2 97% Physical Exam  Nursing note and vitals reviewed.  Physical Exam  Nursing note and vitals reviewed. Constitutional: Well developed, well nourished, non-toxic, and in no acute distress Head: Normocephalic and atraumatic.  Mouth/Throat: Oropharynx is clear and moist.  Neck: Normal range of motion. Neck supple.  Cardiovascular: +2 DP pulses bilaterally Pulmonary/Chest: Effort normal. Abdominal: Soft. There is no tenderness. There is no rebound and no guarding.  Musculoskeletal: Normal ROM of RLE without deformities, swelling, or overlying skin changes. Negative straight leg raise bilaterally. No lumbar spine tenderness. Neurological: Alert, no facial droop, fluent speech, sensation to light touch in tact in bilateral lower extremities, full strength ankle dorsi/plantarflexion bilaterally Skin: Skin is warm and dry.  Psychiatric: Cooperative  ED Course  Procedures (including critical care time) DIAGNOSTIC STUDIES: Oxygen Saturation is 100% on RA, normal by my interpretation.    COORDINATION OF CARE: 11:59 AM- Pt advised of plan for treatment and pt agrees. Pt will receive hip x-ray and [place] of right lower leg for further evaluation.    Labs Review Labs Reviewed - No data to display  Imaging Review US Venous Img Lower Unilateral Right  08/19/2015  CLINICAL DATA:  Pain from the right  hip to the right foot for 1 week. EXAM: RIGHT LOWER EXTREMITY VENOUS DOPPLER ULTRASOUND TECHNIQUE: Gray-scale sonography with graded compression, as well as color Doppler and  duplex ultrasound were performed to evaluate the lower extremity deep venous systems from the level of the common femoral vein and including the common femoral, femoral, profunda femoral, popliteal and calf veins including the posterior tibial, peroneal and gastrocnemius veins when visible. The superficial great saphenous vein was also interrogated. Spectral Doppler was utilized to evaluate flow at rest and with distal augmentation maneuvers in the common femoral, femoral and popliteal veins. COMPARISON:  None. FINDINGS: Contralateral Common Femoral Vein: Respiratory phasicity is normal and symmetric with the symptomatic side. No evidence of thrombus. Normal compressibility. Common Femoral Vein: No evidence of thrombus. Normal compressibility, respiratory phasicity and response to augmentation. Saphenofemoral Junction: No evidence of thrombus. Normal compressibility and flow on color Doppler imaging. Profunda Femoral Vein: No evidence of thrombus. Normal compressibility and flow on color Doppler imaging. Femoral Vein: No evidence of thrombus. Normal compressibility, respiratory phasicity and response to augmentation. Popliteal Vein: No evidence of thrombus. Normal compressibility, respiratory phasicity and response to augmentation. Calf Veins: No evidence of thrombus. Normal compressibility and flow on color Doppler imaging. Superficial Great Saphenous Vein: No evidence of thrombus. Normal compressibility and flow on color Doppler imaging. Venous Reflux:  None. Other Findings:  None. IMPRESSION: No evidence of deep venous thrombosis. Electronically Signed   By: Lajean Manes M.D.   On: 08/19/2015 13:56   Dg Hip Unilat With Pelvis 2-3 Views Right  08/19/2015  CLINICAL DATA:  Right hip pain for 2 months, no known injury EXAM: DG HIP (WITH OR WITHOUT PELVIS) 2-3V RIGHT COMPARISON:  None. FINDINGS: Three views of the right hip submitted. No acute fracture or subluxation. Minimal superior acetabular spurring.  Degenerative changes pubic symphysis. IMPRESSION: No acute fracture or subluxation. Degenerative changes pubic symphysis. Electronically Signed   By: Lahoma Crocker M.D.   On: 08/19/2015 12:28     Forde Dandy, MD has personally reviewed and evaluated these images and lab results as part of her medical decision-making.  MDM   Final diagnoses:  Leg pain  Right hip pain   75 year old female with history of PE and atrial fibrillation on pradaxa who presents with worsening right hip pain. He is nontoxic in no acute distress with stable vital signs on presentation. It is neurovascularly intact in bilateral lower extremities. Without back pain. Pain seems localized to the right hip, and without signs of sciatica on straight leg raise. X-rays with no fracture or subluxation. Ultrasound of the leg without DVT. Given oral Vicodin and subsequently Percocet for pain with some relief. she is able to ambulate. She is referred back to Dr. Percell [name] from orthopedic surgery, with whom she was originally following up with for her hip pain. Strict return and follow-up instructions reviewed. She expressed understanding of all discharge instructions and felt comfortable with the plan of care.    I personally performed the services described in this documentation, which was scribed in my presence. The recorded information has been reviewed and is accurate.    Forde Dandy, MD 08/19/15 [phone]

## 2015-08-19 NOTE — ED Notes (Signed)
Med given. Pt had no obvious problems taking pill or drinking water.

## 2015-08-19 NOTE — ED Notes (Addendum)
Pt states she has been having right hip pain going down leg for a while now. Denies injury.  States she also has a lot of nasal congestion and has choking sensation at times.

## 2015-08-19 NOTE — Discharge Instructions (Signed)
Please call Dr. Debroah Loop office for follow-up regarding further management of your right hip pain. He'll also need to discuss with your primary care doctor about ongoing pain management as needed. Return without fail for worsening symptoms including fever, inability to walk, new numbness or weakness, or any other symptoms concerning to you.   Hip Pain Your hip is the joint between your upper legs and your lower pelvis. The bones, cartilage, tendons, and muscles of your hip joint perform a lot of work each day supporting your body weight and allowing you to move around. Hip pain can range from a minor ache to severe pain in one or both of your hips. Pain may be felt on the inside of the hip joint near the groin, or the outside near the buttocks and upper thigh. You may have swelling or stiffness as well.  HOME CARE INSTRUCTIONS   Take medicines only as directed by your health care provider.  Apply ice to the injured area:  Put ice in a plastic bag.  Place a towel between your skin and the bag.  Leave the ice on for 15-20 minutes at a time, 3-4 times a day.  Keep your leg raised (elevated) when possible to lessen swelling.  Avoid activities that cause pain.  Follow specific exercises as directed by your health care provider.  Sleep with a pillow between your legs on your most comfortable side.  Record how often you have hip pain, the location of the pain, and what it feels like. SEEK MEDICAL CARE IF:   You are unable to put weight on your leg.  Your hip is red or swollen or very tender to touch.  Your pain or swelling continues or worsens after 1 week.  You have increasing difficulty walking.  You have a fever. SEEK IMMEDIATE MEDICAL CARE IF:   You have fallen.  You have a sudden increase in pain and swelling in your hip. MAKE SURE YOU:   Understand these instructions.  Will watch your condition.  Will get help right away if you are not doing well or get worse.   This  information is not intended to replace advice given to you by your health care provider. Make sure you discuss any questions you have with your health care provider.   Document Released: 07/22/2009 Document Revised: 02/22/2014 Document Reviewed: 09/28/2012 Elsevier Interactive Patient Education [employer].

## 2015-08-20 ENCOUNTER — Encounter (HOSPITAL_COMMUNITY): Payer: Self-pay | Admitting: Emergency Medicine

## 2015-08-20 ENCOUNTER — Emergency Department (HOSPITAL_COMMUNITY)
Admission: EM | Admit: 2015-08-20 | Discharge: 2015-08-20 | Disposition: A | Discharge status: Home / Self Care | Payer: Commercial Managed Care - HMO | Attending: Emergency Medicine | Admitting: Emergency Medicine

## 2015-08-20 ENCOUNTER — Emergency Department (HOSPITAL_COMMUNITY): Payer: Commercial Managed Care - HMO

## 2015-08-20 DIAGNOSIS — I4891 Unspecified atrial fibrillation: Secondary | ICD-10-CM | POA: Insufficient documentation

## 2015-08-20 DIAGNOSIS — Z7984 Long term (current) use of oral hypoglycemic drugs: Secondary | ICD-10-CM | POA: Insufficient documentation

## 2015-08-20 DIAGNOSIS — M25561 Pain in right knee: Secondary | ICD-10-CM | POA: Diagnosis not present

## 2015-08-20 DIAGNOSIS — Z79899 Other long term (current) drug therapy: Secondary | ICD-10-CM | POA: Insufficient documentation

## 2015-08-20 DIAGNOSIS — E119 Type 2 diabetes mellitus without complications: Secondary | ICD-10-CM | POA: Diagnosis not present

## 2015-08-20 DIAGNOSIS — F329 Major depressive disorder, single episode, unspecified: Secondary | ICD-10-CM | POA: Diagnosis not present

## 2015-08-20 DIAGNOSIS — I10 Essential (primary) hypertension: Secondary | ICD-10-CM | POA: Diagnosis not present

## 2015-08-20 DIAGNOSIS — Z7901 Long term (current) use of anticoagulants: Secondary | ICD-10-CM | POA: Insufficient documentation

## 2015-08-20 DIAGNOSIS — E785 Hyperlipidemia, unspecified: Secondary | ICD-10-CM | POA: Diagnosis not present

## 2015-08-20 MED ORDER — HYDROMORPHONE HCL 2 MG/ML IJ SOLN
2.0000 mg | Freq: Once | INTRAMUSCULAR | Status: AC
  Administered 2015-08-20: 2 mg via INTRAMUSCULAR
  Filled 2015-08-20: qty 1

## 2015-08-20 NOTE — ED Provider Notes (Signed)
CSN: [id number]     Arrival date & time 08/20/15  1508 History   First MD Initiated Contact with Patient 08/20/15 1652     Chief Complaint  Patient presents with  . Knee Pain     (Consider location/radiation/quality/duration/timing/severity/associated sxs/prior Treatment) Patient is a 75 y.o. female presenting with knee pain. The history is provided by the patient and a relative.  Knee Pain Associated symptoms: no back pain and no fever   Patient was some long-standing problems with right hip pain. Seen by [place] on July 4 had DVT studies done by Doppler which were negative and had x-rays of the pelvis and both hips which were negative. Patient now with increased right knee pain. No falls. Patient is on Percocet 5 mg every 6 hours.  Past Medical History  Diagnosis Date  . Hyperlipidemia     takes Pravastatin daily  . Overweight(278.02)   . Depression   . Migraine   . Degenerative joint disease     Knees  . Glaucoma   . Pulmonary embolism [facility]) May 2012    acute presentation, bilateral PE  . Chronic anticoagulation 07/21/2010  . Anemia, iron deficiency 2012    Evaluated by Dr. Oneida Alar; H&H of 9.3/30.8 with [place] in 10/2010; 3/3 positive Hemoccult cards in 07/2011  . Atrial fibrillation ([place])     takes Coumadin daily  . Hypertension     takes Hyzaar daily and Propranolol   . Pneumonia 1969    hx of  . Hx of migraines     last one about a yr ago;takes Topamax daily  . Joint pain   . Joint swelling   . Chronic back pain     related to knee pain  . History of gout     was on medication but taken off of several months ago  . Bruises easily     takes Coumadin  . Skin spots-aging   . Gastroesophageal reflux disease     takes Omeprazole daily  . Hx of colonic polyps   . Urinary frequency   . Nocturia   . History of blood transfusion 1969  . Diabetes mellitus     takes Metformin daily  . Glaucoma     uses eye drops at night  . Anxiety     was on Prozac for a couple of weeks  .  Insomnia     takes Restoril prn  . Memory loss     takes donepezil  . Migraine    Past Surgical History  Procedure Laterality Date  . Tonsillectomy    . Dilation and curettage of uterus    . Urethral dilation    . Knee arthroscopy w/ meniscectomy  90's    Left  . Colonoscopy  Jan 2002; 2012    2002: Dr. Tamala Julian, ext. hemorrhoids, nl colon; 2012-adenomatous polyps, gastritis on EGD  . Upper gastrointestinal endoscopy    . Orif ankle fracture Right 90's  . Givens capsule study  08/18/2011    Procedure: GIVENS CAPSULE STUDY;  Surgeon: Rogene Houston, MD;  Location: [facility];  Service: Endoscopy;  Laterality: N/A;  730  . Esophagogastroduodenoscopy  08/24/2011    ESOPHAGOGASTRODUODENOSCOPY; esophageal dilatation; Rogene Houston, MD;  . Total knee arthroplasty  10/20/2011    Procedure: TOTAL KNEE ARTHROPLASTY;  Surgeon: Ninetta Lights, MD;  Location: [place];  Service: Orthopedics;  Laterality: Left;  . Cataract extraction w/phaco Right 04/11/2012    Procedure: CATARACT EXTRACTION PHACO AND INTRAOCULAR LENS PLACEMENT (  [place]);  Surgeon: Elta Guadeloupe T. Gershon Crane, MD;  Location: [facility] OR[facility];  Service: Ophthalmology;  Laterality: Right;  CDE=9.61  . Cataract extraction w/phaco Left 12/26/2012    Procedure: CATARACT EXTRACTION PHACO AND INTRAOCULAR LENS PLACEMENT ([facility]);  Surgeon: Elta Guadeloupe T. Gershon Crane, MD;  Location: [facility];  Service: Ophthalmology;  Laterality: Left;  CDE:7.74   Family History  Problem Relation Age of Onset  . Diabetes Mother   . Hypertension Mother   . Arthritis Mother   . Heart failure Mother   . Leukemia Father   . Diabetes Sister   . Hypertension Sister   . Diabetes Brother   . Hypertension Brother   . Hypertension Brother   . Hypertension Brother   . Hypertension Brother   . Diabetes Brother   . Diabetes Brother   . Diabetes Brother   . Pulmonary embolism Sister   . Colon cancer Neg Hx   . Colon polyps Neg Hx   . Migraines Father   . Migraines Mother   . Migraines Sister   .  Kidney failure Mother   . Kidney failure Brother   . Kidney failure Father    Social History  Substance Use Topics  . Smoking status: Never Smoker   . Smokeless tobacco: Never Used  . Alcohol Use: No   OB History    No data available     Review of Systems  Constitutional: Negative for fever.  HENT: Negative for congestion.   Eyes: Negative for redness.  Respiratory: Negative for shortness of breath.   Cardiovascular: Negative for chest pain.  Gastrointestinal: Negative for abdominal pain.  Genitourinary: Negative for dysuria.  Musculoskeletal: Positive for joint swelling. Negative for back pain.  Skin: Negative for rash.  Neurological: Negative for headaches.  Hematological: Does not bruise/bleed easily.  Psychiatric/Behavioral: Negative for confusion.      Allergies  Penicillins; Buspar; Fentanyl; Sulfonamide derivatives; and Codeine  Home Medications   Prior to Admission medications   Medication Sig Start Date End Date Taking? Authorizing Provider  amLODipine (NORVASC) 10 MG tablet TAKE 1 TABLET EVERY DAY 08/08/15  Yes [name], MD  atorvastatin (LIPITOR) 40 MG tablet TAKE 1 TABLET EVERY DAY 02/24/15  Yes Arnoldo Lenis, MD  busPIRone (BUSPAR) 5 MG tablet Take 1 tablet (5 mg total) by mouth 3 (three) times daily. 04/24/15  Yes [name], MD  cloNIDine (CATAPRES) 0.1 MG tablet TAKE 1 TABLET (0.1 MG TOTAL) BY MOUTH DAILY. 08/07/15  Yes [name], MD  cyclobenzaprine (FLEXERIL) 10 MG tablet Take 1 tablet (10 mg total) by mouth 2 (two) times daily as needed for muscle spasms. 08/19/15  Yes Forde Dandy, MD  donepezil (ARICEPT) 10 MG tablet TAKE 1 TABLET AT BEDTIME 08/08/15  Yes [name], MD  gabapentin (NEURONTIN) 300 MG capsule Take 300 mg by mouth at bedtime.   Yes Historical Provider, MD  imipramine (TOFRANIL) 50 MG tablet TAKE 2 TABLETS AT BEDTIME 08/08/15  Yes [name], MD  meclizine (ANTIVERT) 25 MG tablet TAKE 1 TABLET THREE  TIMES DAILY AS NEEDED FOR DIZZINESS. 02/25/15  Yes [name], MD  metFORMIN (GLUCOPHAGE) 500 MG tablet TAKE 1 TABLET TWICE DAILY WITH MEALS 08/08/15  Yes [name], MD  montelukast (SINGULAIR) 10 MG tablet TAKE 1 TABLET (10 MG TOTAL) BY MOUTH AT BEDTIME. 04/18/15  Yes [name], MD  omeprazole (PRILOSEC) 40 MG capsule TAKE 1 CAPSULE EVERY DAY 08/08/15  Yes [name], MD  ondansetron (  ZOFRAN) 4 MG tablet Take 4 mg by mouth 2 (two) times daily as needed for nausea or vomiting.   Yes Historical Provider, MD  oxyCODONE-acetaminophen (PERCOCET/ROXICET) 5-325 MG tablet Take 1-2 tablets by mouth every 6 (six) hours as needed for severe pain. 08/19/15  Yes Forde Dandy, MD  PRADAXA 150 MG CAPS capsule TAKE 1 CAPSULE EVERY 12 HOURS 08/08/15  Yes Arnoldo Lenis, MD  propranolol (INDERAL) 80 MG tablet TAKE 1 TABLET EVERY DAY 08/08/15  Yes [name], MD  SUMAtriptan (IMITREX) 5 MG/ACT nasal spray Place 1 spray (5 mg total) into the nose every 2 (two) hours as needed for migraine. One spray into each nostril , may repeat once after 2 hours. No more than 4 sprays in a 24 hour period. No more than twice weekly 05/29/15  Yes [name], MD  temazepam (RESTORIL) 30 MG capsule Take 1 capsule (30 mg total) by mouth at bedtime as needed for sleep. 05/26/15  Yes [name], MD  topiramate (TOPAMAX) 100 MG tablet TAKE 1 TABLET TWICE DAILY 11/27/14  Yes [name], MD  acyclovir (ZOVIRAX) 400 MG tablet Take 1 tablet (400 mg total) by mouth 3 (three) times daily. Patient not taking: Reported on 08/19/2015 05/29/15   [name], MD  albuterol (PROVENTIL HFA;VENTOLIN HFA) 108 (90 Base) MCG/ACT inhaler Inhale 2 puffs into the lungs every 6 (six) hours as needed for wheezing or shortness of breath. Patient not taking: Reported on 08/19/2015 03/07/15   [name], MD  nitrofurantoin (MACRODANTIN) 100 MG capsule Take 1 capsule (100 mg total) by mouth 2 (two)  times daily. Patient not taking: Reported on 08/19/2015 08/08/15   [name], MD  tiZANidine (ZANAFLEX) 2 MG tablet Take 1 tablet (2 mg total) by mouth at bedtime. Patient not taking: Reported on 08/19/2015 08/01/15   [name], MD   BP 119/67 mmHg  Pulse 69  Temp(Src) 98.3 F (36.8 C) (Oral)  Resp 16  Ht 5\' 7"  (1.702 m)  Wt 97.07 kg  BMI 33.51 kg/m2  SpO2 97% Physical Exam  Constitutional: She is oriented to person, place, and time. She appears well-developed and well-nourished. No distress.  HENT:  Head: Normocephalic and atraumatic.  Mouth/Throat: Oropharynx is clear and moist.  Eyes: Conjunctivae and EOM are normal. Pupils are equal, round, and reactive to light.  Neck: Normal range of motion. Neck supple.  Cardiovascular: Normal rate, regular rhythm and normal heart sounds.   No murmur heard. Pulmonary/Chest: Effort normal and breath sounds normal. No respiratory distress.  Abdominal: Soft. Bowel sounds are normal. There is no tenderness.  Musculoskeletal: Normal range of motion. She exhibits no edema.  Patient was some pain with range of motion of right knee and right hip area.  Neurological: She is alert and oriented to person, place, and time. No cranial nerve deficit. She exhibits normal muscle tone. Coordination normal.  Skin: Skin is warm. No rash noted.  Nursing note and vitals reviewed.   ED Course  Procedures (including critical care time) Labs Review Labs Reviewed - No data to display  Imaging Review US Venous Img Lower Unilateral Right  08/19/2015  CLINICAL DATA:  Pain from the right hip to the right foot for 1 week. EXAM: RIGHT LOWER EXTREMITY VENOUS DOPPLER ULTRASOUND TECHNIQUE: Gray-scale sonography with graded compression, as well as color Doppler and duplex ultrasound were performed to evaluate the lower extremity deep venous systems from the level of the common femoral vein  and including the common femoral, femoral, profunda femoral, popliteal  and calf veins including the posterior tibial, peroneal and gastrocnemius veins when visible. The superficial great saphenous vein was also interrogated. Spectral Doppler was utilized to evaluate flow at rest and with distal augmentation maneuvers in the common femoral, femoral and popliteal veins. COMPARISON:  None. FINDINGS: Contralateral Common Femoral Vein: Respiratory phasicity is normal and symmetric with the symptomatic side. No evidence of thrombus. Normal compressibility. Common Femoral Vein: No evidence of thrombus. Normal compressibility, respiratory phasicity and response to augmentation. Saphenofemoral Junction: No evidence of thrombus. Normal compressibility and flow on color Doppler imaging. Profunda Femoral Vein: No evidence of thrombus. Normal compressibility and flow on color Doppler imaging. Femoral Vein: No evidence of thrombus. Normal compressibility, respiratory phasicity and response to augmentation. Popliteal Vein: No evidence of thrombus. Normal compressibility, respiratory phasicity and response to augmentation. Calf Veins: No evidence of thrombus. Normal compressibility and flow on color Doppler imaging. Superficial Great Saphenous Vein: No evidence of thrombus. Normal compressibility and flow on color Doppler imaging. Venous Reflux:  None. Other Findings:  None. IMPRESSION: No evidence of deep venous thrombosis. Electronically Signed   By: Lajean Manes M.D.   On: 08/19/2015 13:56   Dg Knee Complete 4 Views Right  08/20/2015  CLINICAL DATA:  RIGHT knee pain since yesterday, no known injury, pain from RIGHT hip down to the EXAM: RIGHT KNEE - COMPLETE 4+ VIEW COMPARISON:  None FINDINGS: Mild osseous demineralization. Advanced osteoarthritic changes RIGHT knee with joint space narrowing and spur formation. No acute fracture, dislocation or bone destruction. No knee joint effusion. IMPRESSION: Advanced osteoarthritic changes RIGHT knee. Electronically Signed   By: Lavonia Dana M.D.   On:  08/20/2015 17:27   Dg Hip Unilat With Pelvis 2-3 Views Right  08/19/2015  CLINICAL DATA:  Right hip pain for 2 months, no known injury EXAM: DG HIP (WITH OR WITHOUT PELVIS) 2-3V RIGHT COMPARISON:  None. FINDINGS: Three views of the right hip submitted. No acute fracture or subluxation. Minimal superior acetabular spurring. Degenerative changes pubic symphysis. IMPRESSION: No acute fracture or subluxation. Degenerative changes pubic symphysis. Electronically Signed   By: Lahoma Crocker M.D.   On: 08/19/2015 12:28   I have personally reviewed and evaluated these images and lab results as part of my medical decision-making.   EKG Interpretation None      MDM   Final diagnoses:  Knee pain, acute, right    Patient presents back with increased right knee pain. Patient was seen on July 4 had x-rays of pelvis and both hips without any bony injuries. Also had Doppler studies of the right leg to rule out DVT. Patient's x-ray of her right knee today does show significant arthritic changes in the knee which would explain the pain. Patient's on Percocet as only taking 5 mg every 6 hours notified patient that she can take 2 tablets a total of 10 mg every 6 hours. Patient has follow-up with orthopedics. Patient received IM hydromorphone 1 mg here with significant improvement in the pain. Patient feeling much better.    Fredia Sorrow, MD 08/20/15 [phone]

## 2015-08-20 NOTE — ED Notes (Addendum)
Patient complaining of right knee pain since yesterday. States she was treated here yesterday for same. Denies injury. States pain radiated from right hip down to knee.

## 2015-08-20 NOTE — ED Notes (Signed)
Pt reports having right knee pain earlier today, denies pain currently.  Family concerned about blood clot in right leg and expresses desire to have that evaluated.  Reviewed records and with pt permission discussed the venous duplex done yesterday to r/o evidence of DVT.  Family was not aware this study was done and verbalized no further complaints.

## 2015-08-20 NOTE — ED Notes (Signed)
Pt's family upset no meal trays were available for pt.  Pt provided with pack of nabs and gingerale.  Apologized for pt being hungry, but explained pt would be discharged and food could be obtained.

## 2015-08-20 NOTE — Discharge Instructions (Signed)
Recommend taking 2 of your Percocet as needed for the pain. Can take 2 every 6 hours. Make an appointment to follow-up with the orthopedic doctor. Today's x-rays of the right knee without any bony injuries but she do have significant arthritis there.

## 2015-08-22 DIAGNOSIS — M170 Bilateral primary osteoarthritis of knee: Secondary | ICD-10-CM | POA: Diagnosis not present

## 2015-08-25 ENCOUNTER — Telehealth: Payer: Self-pay | Admitting: Family Medicine

## 2015-08-25 ENCOUNTER — Ambulatory Visit (INDEPENDENT_AMBULATORY_CARE_PROVIDER_SITE_OTHER): Payer: Commercial Managed Care - HMO | Admitting: Family Medicine

## 2015-08-25 ENCOUNTER — Encounter: Payer: Self-pay | Admitting: Family Medicine

## 2015-08-25 VITALS — BP 134/78 | HR 72 | Resp 18 | Ht 67.0 in

## 2015-08-25 DIAGNOSIS — I10 Essential (primary) hypertension: Secondary | ICD-10-CM | POA: Diagnosis not present

## 2015-08-25 DIAGNOSIS — F418 Other specified anxiety disorders: Secondary | ICD-10-CM | POA: Diagnosis not present

## 2015-08-25 DIAGNOSIS — M5441 Lumbago with sciatica, right side: Secondary | ICD-10-CM | POA: Diagnosis not present

## 2015-08-25 MED ORDER — HYDROCODONE-ACETAMINOPHEN 10-325 MG PO TABS: ORAL_TABLET | ORAL | Status: DC

## 2015-08-25 MED ORDER — OXYCODONE-ACETAMINOPHEN 10-325 MG PO TABS: ORAL_TABLET | ORAL | Status: DC

## 2015-08-25 NOTE — Telephone Encounter (Signed)
Patient is coming in for ov today

## 2015-08-25 NOTE — Assessment & Plan Note (Addendum)
Uncontrolled, pain contract today, needs neurosurg consult, referral sent, she will be accompanied by her children

## 2015-08-25 NOTE — Assessment & Plan Note (Signed)
Increased depression due  Uncontrolled pain and poor health of her spouse,  Not suicidal or homicidal, anxious and in pain. No med change at this time

## 2015-08-25 NOTE — Progress Notes (Signed)
   [name]     MRN: [id number]      DOB: March 16, 1940   HPI Of 2 recent ED visits for uncontrolled pain in back , right hip and right knee.  Crying out loud in pain stating no relief from all tried measures. Oxycodone tablets provide temporary relief Review of record from her March ortho visit documents recommendation for spine surgery based on March MRI of 2017. She has repeatedly stated that the epidural she got worsened her symptoms and is now willing to consider surgery, also has right lower ext weakness and parasthesias  ROS Denies recent fever or chills. Denies sinus pressure, nasal congestion, ear pain or sore throat. Denies chest congestion, productive cough or wheezing. Denies chest pains, palpitations and leg swelling Denies abdominal pain, nausea, vomiting,diarrhea or constipation.   Denies dysuria, frequency, hesitancy or incontinence.   Denies skin break down or rash.   PE  BP 134/78 mmHg  Pulse 72  Resp 18  Ht 5\' 7"  (1.702 m)  SpO2 96%  Patient alert and oriented and in no cardiopulmonary distress.Cryng out in pain, sister reports she has been doing this for the past 3 days  HEENT: No facial asymmetry, EOMI,   oropharynx pink and moist.  Neck supple no JVD, no mass.  Chest: Clear to auscultation bilaterally.  CVS: S1, S2 no murmurs, no S3.Regular rate.  ABD: Soft non tender.   Ext: No edema  MS: markedly decreased  ROM spine, right hip and right knee Skin: Intact, no ulcerations or rash noted.  Psych: Good eye contact,tearfull affect. Memory impaired  both anxious and  depressed appearing.  CNS: CN 2-12 intact, decreased power, and sensation in RLE .   Assessment & Plan  Right-sided low back pain with right-sided sciatica Uncontrolled, pain contract today, needs neurosurg consult, referral sent, she will be accompanied by her children  HTN (hypertension) Initially elevated at visit , but once patient sat for a while and calmed down slightly ,  initally crying , normalized, no med change  Depression with anxiety Increased depression due  Uncontrolled pain and poor health of her spouse,  Not suicidal or homicidal, anxious and in pain. No med change at this time

## 2015-08-25 NOTE — Patient Instructions (Addendum)
F/u in August as before  New pain contract  To take meds every 8 hours, stay on schedule, and follow contract  I recommend you see a neurosurgeon with your children to see if surgery  On your back is a good optionas soon as possible , referral is being entered and I need the most recent MRI report of your sppine from Dr Debroah Loop office   [facility] that you feel better soon  Thank you  for choosing [facility]. We consider it a privelige to serve you.  Delivering excellent health care in a caring and  compassionate way is our goal.  Partnering with you,  so that together we can achieve this goal is our strategy.

## 2015-08-25 NOTE — Assessment & Plan Note (Signed)
Initially elevated at visit , but once patient sat for a while and calmed down slightly , initally crying , normalized, no med change

## 2015-08-25 NOTE — Telephone Encounter (Signed)
Please call [name] regarding [place] , thanks

## 2015-08-25 NOTE — Telephone Encounter (Signed)
Patient seen in the ED x 2.  Per sister she has seen ortho since and was told that management will need be done through primary care.

## 2015-08-26 ENCOUNTER — Other Ambulatory Visit: Payer: Self-pay

## 2015-08-26 ENCOUNTER — Telehealth: Payer: Self-pay

## 2015-08-26 MED ORDER — CYCLOBENZAPRINE HCL 10 MG PO TABS: ORAL_TABLET | ORAL | Status: DC

## 2015-08-26 NOTE — Telephone Encounter (Signed)
Entered historically, pls send to local pharmacy

## 2015-08-26 NOTE — Telephone Encounter (Signed)
Medication sent to pharmacy  

## 2015-08-27 NOTE — Telephone Encounter (Signed)
Message left for sister notifying.

## 2015-09-04 ENCOUNTER — Other Ambulatory Visit: Payer: Self-pay | Admitting: Cardiology

## 2015-09-04 NOTE — Telephone Encounter (Signed)
Pt states her daughter forgot to reorder med 2 boxes pradaxa 150 mg lot [id number]  , exp 12/2016

## 2015-09-04 NOTE — Telephone Encounter (Signed)
Pt is out of her PRADAXA 150 MG CAPS capsule [id number] and she uses mail order pharmacy and she is completely out, pt is wondering if she could get some samples.

## 2015-09-08 DIAGNOSIS — M4806 Spinal stenosis, lumbar region: Secondary | ICD-10-CM | POA: Diagnosis not present

## 2015-09-15 ENCOUNTER — Ambulatory Visit: Payer: Self-pay | Admitting: Family Medicine

## 2015-09-18 ENCOUNTER — Other Ambulatory Visit: Payer: Self-pay

## 2015-09-18 MED ORDER — OXYCODONE-ACETAMINOPHEN 10-325 MG PO TABS: ORAL_TABLET | ORAL | 0 refills | Status: DC

## 2015-09-23 ENCOUNTER — Other Ambulatory Visit: Payer: Self-pay

## 2015-09-23 MED ORDER — MONTELUKAST SODIUM 10 MG PO TABS: ORAL_TABLET | ORAL | 1 refills | Status: DC

## 2015-09-24 ENCOUNTER — Other Ambulatory Visit: Payer: Self-pay

## 2015-09-24 MED ORDER — CYCLOBENZAPRINE HCL 10 MG PO TABS: ORAL_TABLET | ORAL | 2 refills | Status: DC

## 2015-09-29 ENCOUNTER — Encounter: Payer: Self-pay | Admitting: Family Medicine

## 2015-10-01 ENCOUNTER — Encounter: Payer: Self-pay | Admitting: Family Medicine

## 2015-10-17 ENCOUNTER — Other Ambulatory Visit: Payer: Self-pay

## 2015-10-17 MED ORDER — OXYCODONE-ACETAMINOPHEN 10-325 MG PO TABS: ORAL_TABLET | ORAL | 0 refills | Status: DC

## 2015-10-29 ENCOUNTER — Ambulatory Visit (INDEPENDENT_AMBULATORY_CARE_PROVIDER_SITE_OTHER): Payer: Commercial Managed Care - HMO | Admitting: Family Medicine

## 2015-10-29 ENCOUNTER — Encounter: Payer: Self-pay | Admitting: Family Medicine

## 2015-10-29 ENCOUNTER — Other Ambulatory Visit (HOSPITAL_COMMUNITY)
Admission: RE | Admit: 2015-10-29 | Discharge: 2015-10-29 | Disposition: A | Discharge status: Home / Self Care | Payer: Commercial Managed Care - HMO | Source: Ambulatory Visit | Attending: Family Medicine | Admitting: Family Medicine

## 2015-10-29 VITALS — BP 130/80 | HR 77 | Resp 16 | Ht 67.0 in | Wt 220.0 lb

## 2015-10-29 DIAGNOSIS — N760 Acute vaginitis: Secondary | ICD-10-CM | POA: Diagnosis not present

## 2015-10-29 DIAGNOSIS — G43109 Migraine with aura, not intractable, without status migrainosus: Secondary | ICD-10-CM

## 2015-10-29 DIAGNOSIS — E785 Hyperlipidemia, unspecified: Secondary | ICD-10-CM

## 2015-10-29 DIAGNOSIS — E119 Type 2 diabetes mellitus without complications: Secondary | ICD-10-CM | POA: Diagnosis not present

## 2015-10-29 DIAGNOSIS — D509 Iron deficiency anemia, unspecified: Secondary | ICD-10-CM

## 2015-10-29 DIAGNOSIS — I10 Essential (primary) hypertension: Secondary | ICD-10-CM | POA: Diagnosis not present

## 2015-10-29 DIAGNOSIS — Z23 Encounter for immunization: Secondary | ICD-10-CM

## 2015-10-29 DIAGNOSIS — E8881 Metabolic syndrome: Secondary | ICD-10-CM

## 2015-10-29 DIAGNOSIS — N39498 Other specified urinary incontinence: Secondary | ICD-10-CM

## 2015-10-29 DIAGNOSIS — B3789 Other sites of candidiasis: Secondary | ICD-10-CM | POA: Insufficient documentation

## 2015-10-29 DIAGNOSIS — Z79899 Other long term (current) drug therapy: Secondary | ICD-10-CM | POA: Insufficient documentation

## 2015-10-29 MED ORDER — SUMATRIPTAN 5 MG/ACT NA SOLN: 1.0000 | NASAL | 2 refills | Status: DC | PRN

## 2015-10-29 MED ORDER — ACYCLOVIR 400 MG PO TABS: 400.0000 mg | ORAL_TABLET | Freq: Three times a day (TID) | ORAL | 5 refills | Status: DC

## 2015-10-29 MED ORDER — TEMAZEPAM 30 MG PO CAPS: 30.0000 mg | ORAL_CAPSULE | Freq: Every evening | ORAL | 3 refills | Status: DC | PRN

## 2015-10-29 NOTE — Progress Notes (Signed)
[name]     MRN: [id number]      DOB: [birth year]   HPI Ms. Sill is here for follow up and re-evaluation of chronic medical conditions, medication management and review of any available recent lab and radiology data.  Preventive health is updated, specifically  Cancer screening and Immunization.   Questions or concerns regarding consultations or procedures which the PT has had in the interim are  Addressed.Surgery was recommended for her knee , she is not interested The PT denies any adverse reactions to current medications since the last visit.  1 week h/testing vaginal itch and ulcers, has hSV 2 positive serology Requests assistance at home with blood sugar testing, medication review reveal;s need to establish current medications she is taking  ROS Denies recent fever or chills. Denies sinus pressure, nasal congestion, ear pain or sore throat. Denies chest congestion, productive cough or wheezing. Denies chest pains, palpitations and leg swelling Denies abdominal pain, nausea, vomiting,diarrhea or constipation.   Denies dysuria, frequency, hesitancy or incontinence. Chronic  joint pain, swelling and limitation in mobility unchanged C/o  headaches, and right leg numbness,  Denies depression c/o , anxiety and  insomnia. Denies skin break down or rash.   PE  BP 130/80   Pulse 77   Resp 16   Ht 5\' 7"  (1.702 m)   Wt 220 lb (99.8 kg)   SpO2 95%   BMI 34.46 kg/m   Patient alert and oriented and in no cardiopulmonary distress.  HEENT: No facial asymmetry, EOMI,   oropharynx pink and moist.  Neck supple no JVD, no mass.  Chest: Clear to auscultation bilaterally.  CVS: S1, S2 no murmurs, no S3.Regular rate.  ABD: Soft non tender.   Ext: No edema  MS: Adequate though reduced  ROM spine, shoulders, hips and knees.  Skin: Intact, no ulcerations or rash noted.  Psych: Good eye contact, normal affect. Memory intact not anxious or depressed appearing.  CNS: CN 2-12  intact, power,  normal throughout.no focal deficits noted.   Assessment & Plan  HTN (hypertension) Controlled, no change in medication DASH diet and commitment to daily physical activity for a minimum of 30 minutes discussed and encouraged, as a part of hypertension management. The importance of attaining a healthy weight is also discussed.  BP/Weight 10/29/2015 08/25/2015 08/20/2015 08/19/2015 08/01/2015 [id number]  Systolic BP [id number] Q000111Q 123456 Q000111Q [id number] Q000111Q 123456  Diastolic BP 80 78 67 79 70 86 94  Wt. (Lbs) 220 - 214 222 221 222 218  BMI 34.46 - 33.51 34.76 34.61 34.76 34.14       Type 2 diabetes mellitus with HbA1C goal below 8.0 Updated lab needed at/ before next visit. Ms. Searfoss is reminded of the importance of commitment to daily physical activity for 30 minutes or more, as able and the need to limit carbohydrate intake to 30 to 60 grams per meal to help with blood sugar control.   The need to take medication as prescribed, test blood sugar as directed, and to call between visits if there is a concern that blood sugar is uncontrolled is also discussed.   Ms. Recio is reminded of the importance of daily foot exam, annual eye examination, and good blood sugar, blood pressure and cholesterol control.  Diabetic Labs Latest Ref Rng & Units 05/19/2015 09/03/2014 07/09/2014 06/06/2014 11/15/2013  HbA1c <5.7 % 7.6(H) 7.5(H) - 7.8(H) 6.8(H)  Microalbumin <2.0 mg/dL - 4.5(H) - - 12.0(H)  Micro/Creat Ratio 0.0 - 30.0 mg/g -  16.2 - - 29.8  Chol 125 - 200 mg/dL 148 145 - - 161  HDL >=46 mg/dL 77 61 - - 60  Calc LDL <130 mg/dL 46 56 - - 69  Triglycerides <150 mg/dL 126 139 - - 162(H)  Creatinine 0.60 - 0.93 mg/dL 1.11(H) 0.97 1.13(H) 0.87 1.17(H)   BP/Weight 10/29/2015 08/25/2015 08/20/2015 08/19/2015 08/01/2015 [id number]  Systolic BP [id number] Q000111Q 123456 Q000111Q A999333 Q000111Q 123456  Diastolic BP 80 78 67 79 70 86 94  Wt. (Lbs) 220 - 214 222 221 222 218  BMI 34.46 - 33.51 34.76 34.61 34.76 34.14   Foot/eye exam  completion dates Latest Ref Rng & Units 05/29/2015 12/10/2014  Eye Exam No Retinopathy - No Retinopathy  Foot exam Order - - -  Foot Form Completion - Done -        Encounter for medication management Many of currnet meds are old, unclear as to what pt is taking , needs tHN input , also requests someone to test her blood sugars  Vaginitis and vulvovaginitis 1 week flare, liklely hSV2, will send urine for wet prep also  Migraine Controlled on medications , needs imitrex nasal spray refilled   Hyperlipemia Hyperlipidemia:Low fat diet discussed and encouraged.   Lipid Panel  Lab Results  Component Value Date   CHOL 148 05/19/2015   HDL 77 05/19/2015   LDLCALC 46 05/19/2015   TRIG 126 05/19/2015   CHOLHDL 1.9 05/19/2015     Updated lab needed at/ before next visit.   Urinary incontinence Controlled, no change in medication   Morbid obesity Deteriorated. Patient re-educated about  the importance of commitment to a  minimum of 150 minutes of exercise per week.  The importance of healthy food choices with portion control discussed. Encouraged to start a food diary, count calories and to consider  joining a support group. Sample diet sheets offered. Goals set by the patient for the next several months.   Weight /BMI 10/29/2015 08/25/2015 08/20/2015  WEIGHT 220 lb - 214 lb  HEIGHT 5\' 7"  5\' 7"  5\' 7"   BMI 34.46 kg/m2 - 33.51 kg/m2

## 2015-10-29 NOTE — Assessment & Plan Note (Signed)
Controlled, no change in medication  

## 2015-10-29 NOTE — Assessment & Plan Note (Signed)
Updated lab needed at/ before next visit. [name] is reminded of the importance of commitment to daily physical activity for 30 minutes or more, as able and the need to limit carbohydrate intake to 30 to 60 grams per meal to help with blood sugar control.   The need to take medication as prescribed, test blood sugar as directed, and to call between visits if there is a concern that blood sugar is uncontrolled is also discussed.   [name] is reminded of the importance of daily foot exam, annual eye examination, and good blood sugar, blood pressure and cholesterol control.  Diabetic Labs Latest Ref Rng & Units 05/19/2015 09/03/2014 07/09/2014 06/06/2014 11/15/2013  HbA1c <5.7 % 7.6(H) 7.5(H) - 7.8(H) 6.8(H)  Microalbumin <2.0 mg/dL - 4.5(H) - - 12.0(H)  Micro/Creat Ratio 0.0 - 30.0 mg/g - 16.2 - - 29.8  Chol 125 - 200 mg/dL 148 145 - - 161  HDL >=46 mg/dL 77 61 - - 60  Calc LDL <130 mg/dL 46 56 - - 69  Triglycerides <150 mg/dL 126 139 - - 162(H)  Creatinine 0.60 - 0.93 mg/dL 1.11(H) 0.97 1.13(H) 0.87 1.17(H)   BP/Weight 10/29/2015 08/25/2015 08/20/2015 08/19/2015 08/01/2015 123456 [id number]  Systolic BP [id number] Q000111Q 123456 Q000111Q A999333 Q000111Q 123456  Diastolic BP 80 78 67 79 70 86 94  Wt. (Lbs) 220 - 214 222 221 222 218  BMI 34.46 - 33.51 34.76 34.61 34.76 34.14   Foot/eye exam completion dates Latest Ref Rng & Units 05/29/2015 12/10/2014  Eye Exam No Retinopathy - No Retinopathy  Foot exam Order - - -  Foot Form Completion - Done -

## 2015-10-29 NOTE — Assessment & Plan Note (Signed)
1 week flare, liklely hSV2, will send urine for wet prep also

## 2015-10-29 NOTE — Assessment & Plan Note (Signed)
Deteriorated. Patient re-educated about  the importance of commitment to a  minimum of 150 minutes of exercise per week.  The importance of healthy food choices with portion control discussed. Encouraged to start a food diary, count calories and to consider  joining a support group. Sample diet sheets offered. Goals set by the patient for the next several months.   Weight /BMI 10/29/2015 08/25/2015 08/20/2015  WEIGHT 220 lb - 214 lb  HEIGHT 5\' 7"  5\' 7"  5\' 7"   BMI 34.46 kg/m2 - 33.51 kg/m2

## 2015-10-29 NOTE — Assessment & Plan Note (Signed)
Many of currnet meds are old, unclear as to what pt is taking , needs [facility] input , also requests someone to test her blood sugars

## 2015-10-29 NOTE — Assessment & Plan Note (Signed)
Hyperlipidemia:Low fat diet discussed and encouraged.   Lipid Panel  Lab Results  Component Value Date   CHOL 148 05/19/2015   HDL 77 05/19/2015   LDLCALC 46 05/19/2015   TRIG 126 05/19/2015   CHOLHDL 1.9 05/19/2015     Updated lab needed at/ before next visit.

## 2015-10-29 NOTE — Patient Instructions (Addendum)
Annual wellness in 2 month, call if you need me before \\Flu vaccine today  Labs today including urine for infection and microalb  You are referred to [facility] for med management and assistance with blood sugar testing skills  Take buspar 3 times daily for nerves, 1 every 8 hours  Take clonidine at night to help with sleep and blood pressure  Restoril sent for sleep   Acyclovir sent for itch  Imitrex refilled   Thank you  for choosing  Primary Care. We consider it a privelige to serve you.  Delivering excellent health care in a caring and  compassionate way is our goal.  Partnering with you,  so that together we can achieve this goal is our strategy.

## 2015-10-29 NOTE — Assessment & Plan Note (Signed)
Controlled on medications , needs imitrex nasal spray refilled

## 2015-10-29 NOTE — Assessment & Plan Note (Signed)
Controlled, no change in medication DASH diet and commitment to daily physical activity for a minimum of 30 minutes discussed and encouraged, as a part of hypertension management. The importance of attaining a healthy weight is also discussed.  BP/Weight 10/29/2015 08/25/2015 08/20/2015 08/19/2015 08/01/2015 [id number]  Systolic BP [id number] Q000111Q 123456 Q000111Q A999333 Q000111Q 123456  Diastolic BP 80 78 67 79 70 86 94  Wt. (Lbs) 220 - 214 222 221 222 218  BMI 34.46 - 33.51 34.76 34.61 34.76 34.14

## 2015-10-30 ENCOUNTER — Other Ambulatory Visit: Payer: Self-pay | Admitting: *Deleted

## 2015-10-30 LAB — BASIC METABOLIC PANEL WITH GFR
BUN: 23 mg/dL (ref 7–25)
CALCIUM: 9.4 mg/dL (ref 8.6–10.4)
CHLORIDE: 108 mmol/L (ref 98–110)
CO2: 23 mmol/L (ref 20–31)
CREATININE: 1.09 mg/dL — AB (ref 0.60–0.93)
GFR, EST AFRICAN AMERICAN: 57 mL/min — AB (ref >=60)
GFR, EST NON AFRICAN AMERICAN: 49 mL/min — AB (ref >=60)
Glucose, Bld: 83 mg/dL (ref 65–99)
POTASSIUM: 4.7 mmol/L (ref 3.5–5.3)
SODIUM: 142 mmol/L (ref 135–146)

## 2015-10-30 LAB — CBC
HEMATOCRIT: 37.8 % (ref 35.0–45.0)
Hemoglobin: 12.2 g/dL (ref 11.7–15.5)
MCH: 28 pg (ref 27.0–33.0)
MCHC: 32.3 g/dL (ref 32.0–36.0)
MCV: 86.9 fL (ref 80.0–100.0)
MPV: 9.8 fL (ref 7.5–12.5)
Platelets: 382 10*3/uL (ref 140–400)
RBC: 4.35 MIL/uL (ref 3.80–5.10)
RDW: 15.2 % — AB (ref 11.0–15.0)
WBC: 8.6 10*3/uL (ref 3.8–10.8)

## 2015-10-30 LAB — HEMOGLOBIN A1C
Hgb A1c MFr Bld: 6.6 % — ABNORMAL HIGH (ref <5.7)
MEAN PLASMA GLUCOSE: 143 mg/dL

## 2015-10-30 LAB — TSH: TSH: 2.66 mIU/L

## 2015-10-30 NOTE — Patient Outreach (Signed)
[facility]) Care Management  10/30/2015  ISSABELLA [name] October 26, 1940 [id number]  Referral from MD-medication/chronic disease management needs.-  Dx Diabetes  Telephone call to patient who was advised of reason for call & of [facility] care management services.  HIPPA verification received from patient.   Patient agreed to screening assessment. Patient voices that medical conditions are diabetes, high blood pressure and lower back pain.    Voices that she uses 4 prong cane to get around because of back pain. ( pt has had 2 emergency room visits in July for back pain, hip & knee pain). Currently taking OxyContin for back pain. Patient states she is independent with personal care. States she lives with spouse in their home who has medical issues also. . States her daughter prepares her medication weekly, helps with meals and takes her to MD appointments. States son & sister also help with transportation to MD appointments. States she does use Meals On Wheels also.  Getting medications from local pharmacy because she was not getting medications on time when using mail order..   Patient states she can not remember how long she has had diabetes. States she has never checked her own blood sugar and has new glucometer. Does not know what normal & abnormal blood sugars are. Patient was unable to cite symptoms of low/high blood sugar. Patient does not know reason for taking some of her medications.  Patient consents to [facility] services and wishes to have visits at her home.  Assessment: Patient has knowledge deficit regarding self care of chronic condition-diabetes.    Patient is at risk for falls; has low back pain & taking pain medication if needed (tries not to take during the day); using cane to ambulate. Seen in July x 2  in emergency room for severe hip & knee pain..    Patient has support from family members.   Knowledge deficit of reasons for taking medications.  Plan:  Refer to care  management assistant to assign to [facility] for complex case management.  Sherrin Daisy, RN BSN [place] Management Coordinator [facility] Care Management  [phone]

## 2015-10-31 ENCOUNTER — Other Ambulatory Visit: Payer: Self-pay

## 2015-10-31 ENCOUNTER — Other Ambulatory Visit: Payer: Self-pay | Admitting: *Deleted

## 2015-10-31 LAB — URINE CYTOLOGY ANCILLARY ONLY: TRICH (WINDOWPATH): NEGATIVE

## 2015-10-31 MED ORDER — TEMAZEPAM 30 MG PO CAPS: 30.0000 mg | ORAL_CAPSULE | Freq: Every evening | ORAL | 1 refills | Status: DC | PRN

## 2015-10-31 MED ORDER — ATORVASTATIN CALCIUM 40 MG PO TABS: 40.0000 mg | ORAL_TABLET | Freq: Every day | ORAL | 1 refills | Status: DC

## 2015-10-31 NOTE — Patient Outreach (Signed)
From Sherrin Daisy Telephonic RN to [facility] Case Management RN

## 2015-10-31 NOTE — Patient Outreach (Signed)
[name]) Care Management  10/31/2015  [name] 11-29-1940 [id number]  [name] is a 75 year old female with history of hypertension, type II Diabetes, hyperlipidemia, and migraine headache. [name] was referred to [facility] for assistance with medication management and chronic disease management and care coordination. In particular, Dr. Moshe Cipro has asked that we assist [name] with Diabetes management. [name] doesn't check her cbg and has significant knowledge deficits related to diabetes management.   [name] lives in [place] with her husband and is independent with ADL's. She uses 4 prong cane to get around because of back pain. [name] has had 2 emergency room visits in July for back pain, hip & knee pain and is currently taking OxyContin, disposing her to higher risk for fall.   [name]'s daughter helps her with transportation, home management, and fills her medication boxes weekly. She is receiving medications from a local pharmacy because she was not getting medications on time when using mail order.  [name] enjoys the benefit of Meals on Wheels.   I reached out to [name] today by phone and she agreed to a home visit.   Plan: I will see [name] in the home for a face to face visit on 11/11/15 @ 9:30 am after her daughter gets home from her night shift job.    [place] Management  [phone]

## 2015-11-05 LAB — URINE CYTOLOGY ANCILLARY ONLY
Bacterial vaginitis: NEGATIVE
Candida vaginitis: NEGATIVE

## 2015-11-10 ENCOUNTER — Other Ambulatory Visit: Payer: Self-pay

## 2015-11-10 ENCOUNTER — Telehealth: Payer: Self-pay | Admitting: Family Medicine

## 2015-11-10 MED ORDER — HYDROXYZINE HCL 25 MG PO TABS: 25.0000 mg | ORAL_TABLET | Freq: Three times a day (TID) | ORAL | 3 refills | Status: DC | PRN

## 2015-11-10 NOTE — Telephone Encounter (Signed)
Concern addressed

## 2015-11-10 NOTE — Telephone Encounter (Signed)
Judeth Porch needing refill on acyclovir (ZOVIRAX) 400 MG tablet, daughter in the waiting room now waiting for approval

## 2015-11-11 ENCOUNTER — Encounter: Payer: Self-pay | Admitting: *Deleted

## 2015-11-11 ENCOUNTER — Other Ambulatory Visit: Payer: Self-pay | Admitting: *Deleted

## 2015-11-11 NOTE — Patient Outreach (Signed)
[facility]) Care Management   11/11/2015  [name] 24-Dec-1940 [id number]  LACRYSTAL URSERY is an 75 y.o. female with history of hypertension, type II Diabetes, hyperlipidemia, and migraine headache. [name] was referred to [facility] for assistance with medication management and chronic disease management and care coordination. In particular, Dr. Moshe Cipro has asked that we assist [name] with Diabetes management. [name] doesn't check her cbg and has significant knowledge deficits related to diabetes management.   [name] lives in [place] with her husband and is independent with ADL's. She uses 4 prong cane to get around because of back pain. [name] has had 2 emergency room visits in July for back pain, hip &knee pain and is currently taking OxyContin, disposing her to higher risk for fall.   [name]'s daughter helps her with transportation, home management, and fills her medication boxes weekly. She is receiving medications from a local pharmacy because she was not getting medications on time when using mail order.  [name] enjoys the benefit of Meals on Wheels.   Subjective: "I'm just nervous all the time and now I've got this itching"  Objective:  BP 120/72   Pulse 81   SpO2 98%   Review of Systems  Constitutional: Negative for fever, malaise/fatigue and weight loss.  HENT: Negative.   Eyes: Negative.   Respiratory: Negative.   Cardiovascular: Negative for chest pain, palpitations and leg swelling.  Gastrointestinal: Negative.   Genitourinary:       Occasional urinary incontinence described as "leaking"  Musculoskeletal: Positive for myalgias. Negative for falls.  Skin: Positive for itching.       Reports generalized urticaria mostly on arms  Neurological: Negative.  Negative for weakness.  Psychiatric/Behavioral: The patient is nervous/anxious.     Physical Exam  Constitutional: She is oriented to person, place, and time.  Vital signs are normal. She appears well-developed and well-nourished. She is active.  Cardiovascular: Normal rate, regular rhythm and normal heart sounds.  Exam reveals no gallop and no friction rub.   No murmur heard. Respiratory: Effort normal and breath sounds normal. She has no wheezes. She has no rhonchi. She has no rales.  GI: Soft. There is no tenderness.  Neurological: She is alert and oriented to person, place, and time.  Skin: Skin is warm and dry.  Psychiatric: She has a normal mood and affect. Her speech is normal and behavior is normal. Judgment and thought content normal. She exhibits abnormal recent memory.  Patient complains of poor short term memory    Encounter Medications:   Outpatient Encounter Prescriptions as of 11/11/2015  Medication Sig Note  . acyclovir (ZOVIRAX) 400 MG tablet Take 1 tablet (400 mg total) by mouth 3 (three) times daily.   Marland Kitchen albuterol (PROVENTIL HFA;VENTOLIN HFA) 108 (90 Base) MCG/ACT inhaler Inhale 2 puffs into the lungs every 6 (six) hours as needed for wheezing or shortness of breath.   Marland Kitchen amLODipine (NORVASC) 10 MG tablet TAKE 1 TABLET EVERY DAY   . atorvastatin (LIPITOR) 40 MG tablet Take 1 tablet (40 mg total) by mouth daily.   . busPIRone (BUSPAR) 5 MG tablet Take 1 tablet (5 mg total) by mouth 3 (three) times daily.   . cloNIDine (CATAPRES) 0.1 MG tablet TAKE 1 TABLET (0.1 MG TOTAL) BY MOUTH DAILY.   . cyclobenzaprine (FLEXERIL) 10 MG tablet One tablet twice daily, as needed, for muscle spasm 11/11/2015: TAKING DIFFERENTLY; patient states she is taking at night only  .  donepezil (ARICEPT) 10 MG tablet TAKE 1 TABLET AT BEDTIME   . gabapentin (NEURONTIN) 300 MG capsule Take 300 mg by mouth at bedtime.   . hydrOXYzine (ATARAX/VISTARIL) 25 MG tablet Take 1 tablet (25 mg total) by mouth 3 (three) times daily as needed.   Marland Kitchen imipramine (TOFRANIL) 50 MG tablet TAKE 2 TABLETS AT BEDTIME   . meclizine (ANTIVERT) 25 MG tablet TAKE 1 TABLET THREE TIMES  DAILY AS NEEDED FOR DIZZINESS.   . metFORMIN (GLUCOPHAGE) 500 MG tablet TAKE 1 TABLET TWICE DAILY WITH MEALS   . montelukast (SINGULAIR) 10 MG tablet TAKE 1 TABLET (10 MG TOTAL) BY MOUTH AT BEDTIME.   Marland Kitchen omeprazole (PRILOSEC) 40 MG capsule TAKE 1 CAPSULE EVERY DAY   . ondansetron (ZOFRAN) 4 MG tablet Take 4 mg by mouth 2 (two) times daily as needed for nausea or vomiting.   Marland Kitchen oxyCODONE-acetaminophen (PERCOCET) 10-325 MG tablet One tablet every 8 hours for pain 11/11/2015: Takes prn; patient reports does not take daily  . PRADAXA 150 MG CAPS capsule TAKE 1 CAPSULE EVERY 12 HOURS   . propranolol (INDERAL) 80 MG tablet TAKE 1 TABLET EVERY DAY   . SUMAtriptan (IMITREX) 5 MG/ACT nasal spray Place 1 spray (5 mg total) into the nose every 2 (two) hours as needed for migraine. One spray into each nostril , may repeat once after 2 hours. No more than 4 sprays in a 24 hour period. No more than twice weekly   . temazepam (RESTORIL) 30 MG capsule Take 1 capsule (30 mg total) by mouth at bedtime as needed for sleep.   Marland Kitchen topiramate (TOPAMAX) 100 MG tablet TAKE 1 TABLET TWICE DAILY     Assessment:  75 year old female living in [place] with her husband and for whom she shares caregiver responsibilities with her daughters. [name] has deficits in management of diabetes and concerns re: anxiety treatment plan.   Knowledge Deficit related to self health management of diabetes - [name] has a new glucose monitor provided by Dr. Moshe Cipro. She says she has not been regularly checking her cbg's. I demonstrated how to use the new monitor and advised her to check her cbg daily at different times of the day; we will review her findings upon my return in 2 weeks. Her daughter is to help her each day until she feels confident using the monitor. She is taking medications as prescribed and all medications on her med list matched what was present in the home. [name]'s daughter Blythe Stanford is very knowledgeable about  medications and fills [name]'s pill boxes each week.   Last HGA1C (9/13) = 6.6 ( 5 months ago = 7.6)  I discussed prescribed carb modified diet with Mrs. Hellinger and her daughter.   Patient Concerns about treatment plan for anxiety - Mrs. Latimore states she is "anxious all the time" and attributes this to worrying about her husband's health; she says she believes the Buspar she has been prescribed causes her to itch and wants to stop taking it, stating "I would do better back on that Alprazolam"; her daughter Olin Hauser inserted that she had been putting Buspar in the pill box for several months but Mrs. Laneve only recently started to report itching; she started Atarax as of yesterday, as per Dr. Griffin Dakin orders; I encouraged Mrs. Mathias to take her medications exactly as prescribed by Dr. Moshe Cipro, not stopping or starting any medications without instructions from Dr. Moshe Cipro.   Plan:   Mrs. Curbow will take  medications exactly as prescribed.   Mrs. Oetken will check cbg daily for the next 2 weeks.   I will notify Dr. Moshe Cipro of my visit and Mrs. Arias's concerns re: Buspar and anxiety treatment plan.   I will see Mrs. Akerson at home again in 2 weeks, with her daughter present.   [facility] CM Care Plan Problem One   Flowsheet Row Most Recent Value  Care Plan Problem One  Knowledge Deficit related to management of Chronic Health Condition (DM) as evidencd by patient's not checking cbg and non adherence to prescribed carb modified diet  Role Documenting the Problem One  Care Management Coordinator  Care Plan for Problem One  Active  THN Long Term Goal (31-90 days)  Over the next 60 days, patient will verbalize and demonstrate understanding of plan of care for long term management of diabetes  THN Long Term Goal Start Date  11/11/15  Interventions for Problem One Long Term Goal  Utilizing teachback method and [employer], discussed with patient and daughter the importance of development of long term  plan for management of DM  THN CM Short Term Goal #1 (0-30 days)  Over the next 14 days,, patient will check cbg daily  THN CM Short Term Goal #1 Start Date  11/11/15  Interventions for Short Term Goal #1  Utilizing demonstration, educated patient re: use of new glucose meter  THN CM Short Term Goal #2 (0-30 days)  Over the next 30 days, patient will eat 3 times daily, focusing on increased intake of vegetables  THN CM Short Term Goal #2 Start Date  11/11/15  Interventions for Short Term Goal #2  Reviewed with patient and daughter, utilizing teachback method and Nature conservation officer, prescribed carb modified diet    THN CM Care Plan Problem Two   Flowsheet Row Most Recent Value  Care Plan Problem Two  Patient concerns about treatment plan for anxiety  Role Documenting the Problem Two  Care Management [place] for Problem Two  Active  Interventions for Problem Two Long Term Goal   Utilizing teachback method, reviewed with patient and daughter treatment plan as outlined by primary care provider in medical record and medications used for treatment of anxiety  THN Long Term Goal (31-90) days  Over the next 31 days, patient will verbalize understanding of treatment plan prescribed for anxiety  THN Long Term Goal Start Date  11/11/15  THN CM Short Term Goal #1 (0-30 days)  Over the next 30 days, patient will take all medications exactly as prescribed  THN CM Short Term Goal #1 Start Date  11/11/15  Interventions for Short Term Goal #2   Utilizing teachback method, reviewed each medication and prescribing instructions with patient and daughter  THN CM Short Term Goal #2 (0-30 days)  Over the next 30 days, patient and daughter will verbalize understanding of primary care provider's input re: use of Buspar  THN CM Short Term Goal #2 Start Date  11/11/15  Interventions for Short Term Goal #2  Notified patient's  primary care provider of her concerns re: use of Buspar and reported side  effects      [facility] Care Management  [phone]

## 2015-11-21 ENCOUNTER — Other Ambulatory Visit: Payer: Self-pay

## 2015-11-21 MED ORDER — OXYCODONE-ACETAMINOPHEN 10-325 MG PO TABS: ORAL_TABLET | ORAL | 0 refills | Status: DC

## 2015-11-24 ENCOUNTER — Telehealth: Payer: Self-pay

## 2015-11-24 ENCOUNTER — Encounter: Payer: Self-pay | Admitting: *Deleted

## 2015-11-24 ENCOUNTER — Other Ambulatory Visit: Payer: Self-pay | Admitting: *Deleted

## 2015-11-24 MED ORDER — ACCU-CHEK FASTCLIX LANCETS MISC: 5 refills | Status: DC

## 2015-11-24 MED ORDER — ACCU-CHEK AVIVA PLUS W/DEVICE KIT: PACK | 0 refills | Status: DC

## 2015-11-24 MED ORDER — GLUCOSE BLOOD VI STRP: ORAL_STRIP | 5 refills | Status: DC

## 2015-11-24 NOTE — Telephone Encounter (Signed)
rx sent

## 2015-11-24 NOTE — Patient Outreach (Signed)
[facility]) Care Management  11/24/2015  [name] 1940/06/30 [id number]  [name] is an 75 y.o. female with history of hypertension, type II Diabetes, hyperlipidemia, and migraine headache. [name] was referred to [facility] for assistance with medication management and chronic disease management and care coordination. In particular, [name] has asked that we assist [name] with Diabetes management. [name] doesn't check her cbg and has significant knowledge deficits related to diabetes management.   [name] lives in [place] with her husband and is independent with ADL's. She uses 4 prong cane to get around because of back pain. [name] has had 2 emergency room visits in July for back pain, hip &knee pain and is currently taking OxyContin, disposing her to higher risk for fall.   [name]'s daughter helps her with transportation, home management, and fills her medication boxes weekly. She is receivingmedications from a local pharmacy because she was not getting medications on time when using mail order.  [name] enjoys the benefit of Meals on Wheels.   I arrived for our scheduled home visit today but [name] didn't answer and when I called she said she wasn't feeling up to a visit today but would speak with me on the phone.   Subjective: "I'm just not feeling like getting up for a visit today but I'm okay"  Assessment:  75 year old female living in [place] with her husband and for whom she shares caregiver responsibilities with her daughters. [name] has deficits in self health management of diabetes.   Knowledge Deficit related to self health management of diabetes - [name] has a new glucose monitor provided by [name]. She says she has not been regularly checking her cbg's. I demonstrated how to use the new monitor 2 weeks ago and advised her to check her cbg daily at different times of the day. Today, [name] told  me she wasn't sure where her monitor was and that she had run out of the "stickers" (lancets) but when her daughter went to the pharmacy to get some they were $100. [name]'s daughter [name] is very knowledgeable about medications and fills [name]'s pill boxes each week.   Last HGA1C (9/13) = 6.6 ( 5 months ago = 7.6)  I reached out to [name] office to ask the nursing staff about most recent orders for cbg monitoring supplies.   Plan:   [name] will take medications exactly as prescribed.   [name] will check cbg daily and I will follow up with her by phone regarding her cbg checks.    [facility] Management  [phone]

## 2015-12-01 ENCOUNTER — Other Ambulatory Visit: Payer: Self-pay | Admitting: *Deleted

## 2015-12-01 ENCOUNTER — Encounter: Payer: Self-pay | Admitting: *Deleted

## 2015-12-01 NOTE — Patient Outreach (Signed)
[facility]) Care Management  12/01/2015  SAMICA BEMBRY [birth year] [id number]  Unable to reach Mrs. Gritz by phone today and unable to leave voice message.   Plan: I will reach out to Mrs. Balthaser again later this week to follow up and see if she'll allow me to scheduled another home visit. Last week she said she didn't feel like a home visit (scheduled) but did speak with me by phone.    [facility] Management  [phone]

## 2015-12-02 ENCOUNTER — Encounter: Payer: Self-pay | Admitting: *Deleted

## 2015-12-02 ENCOUNTER — Other Ambulatory Visit: Payer: Self-pay | Admitting: *Deleted

## 2015-12-02 NOTE — Patient Outreach (Signed)
[facility]) Care Management  12/02/2015  MARIYAH ZELLMAN January 15, 1941 [id number]   Abha Racanelli Cobbis an 75 y.o.femalewith history of hypertension, type II Diabetes, hyperlipidemia, and migraine headache. Mrs. Raffo was referred to [facility] Management for assistance with medication management and chronic disease management and care coordination. In particular, Dr. Moshe Cipro has asked that we assist Mrs. Murdy with Diabetes management. Mrs. Chavous doesn't check her cbg and has significant knowledge deficits related to diabetes management.   I reached out to Mrs. Flax by phone today to follow up on her DM self management progress and reschedule our home visit which Mrs. Empson was unable to keep 2 weeks ago. Today, Mrs. Henton tells me she has been checking her cbg's but is still in bed and doesn't have her monitor nearby. I encouraged Mrs. Gracia to be diligent about monitoring her cbg's daily and to call her provider for findings outside established parameters.   Plan:   Mrs. Saxer will take medications exactly as prescribed. She will continue to check her cbg's 3 times daily and record.   I will see Mrs. Deihl at home next week for a routine home visit.    [facility] Management  [phone]

## 2015-12-09 ENCOUNTER — Encounter: Payer: Self-pay | Admitting: *Deleted

## 2015-12-09 ENCOUNTER — Other Ambulatory Visit: Payer: Self-pay | Admitting: *Deleted

## 2015-12-09 NOTE — Patient Outreach (Signed)
[facility]) Care Management   12/09/2015  [name] [birth year] [id number]  TIRSA GAIL is an 75 y.o. female with history of hypertension, type II Diabetes, hyperlipidemia, and migraine headache. [name] was referred to [facility] for assistance with medication management and chronic disease management and care coordination. In particular, Dr. Moshe Cipro has asked that we assist [name] with Diabetes management. [name] doesn't check her cbg and has significant knowledge deficits related to diabetes management.   [name] lives in [place] with her husband and is independent with ADL's. She uses 4 prong cane to get around because of back pain. [name] has had 2 emergency room visits in July for back pain, hip &knee pain and is currently taking OxyContin, disposing her to higher risk for fall.   [name] daughter helps her with transportation, home management, and fills her medication boxes weekly. She is receivingmedications from a local pharmacy because she was not getting medications on time when using mail order.  [name] enjoys the benefit of Meals on Wheels.   Subjective: "I'm having trouble doing this sugar checking by myself but I want to learn. My daughter has to work at night and she lives a good piece away."  Objective:  BP (!) 142/92   Pulse 73   SpO2 96%   Review of Systems  Constitutional: Negative.   HENT: Negative.   Eyes: Negative.   Respiratory: Negative.   Cardiovascular: Negative for chest pain, palpitations and leg swelling.  Gastrointestinal: Negative.   Genitourinary: Negative.   Musculoskeletal: Positive for myalgias. Negative for falls.  Skin: Negative.   Neurological: Negative.   Psychiatric/Behavioral: Negative.     Physical Exam  Constitutional: She is oriented to person, place, and time. Vital signs are normal. She appears well-developed and well-nourished. She is active. She does not have a sickly  appearance. She does not appear ill.  Cardiovascular: Normal rate and regular rhythm.   Respiratory: Effort normal and breath sounds normal. She has no wheezes. She has no rhonchi. She has no rales.  GI: Soft. Bowel sounds are normal.  Neurological: She is alert and oriented to person, place, and time.  Skin: Skin is warm, dry and intact.  Psychiatric: She has a normal mood and affect. Her speech is normal and behavior is normal. Judgment and thought content normal. Cognition and memory are normal.    Encounter Medications:   Outpatient Encounter Prescriptions as of 12/09/2015  Medication Sig  . ACCU-CHEK FASTCLIX LANCETS MISC Once daily testing dx e11.9  . acyclovir (ZOVIRAX) 400 MG tablet Take 1 tablet (400 mg total) by mouth 3 (three) times daily.  Marland Kitchen albuterol (PROVENTIL HFA;VENTOLIN HFA) 108 (90 Base) MCG/ACT inhaler Inhale 2 puffs into the lungs every 6 (six) hours as needed for wheezing or shortness of breath.  Marland Kitchen amLODipine (NORVASC) 10 MG tablet TAKE 1 TABLET EVERY DAY  . atorvastatin (LIPITOR) 40 MG tablet Take 1 tablet (40 mg total) by mouth daily.  . Blood Glucose Monitoring Suppl (ACCU-CHEK AVIVA PLUS) w/Device KIT For once daily testing dx E11.9  . busPIRone (BUSPAR) 5 MG tablet Take 1 tablet (5 mg total) by mouth 3 (three) times daily.  . cloNIDine (CATAPRES) 0.1 MG tablet TAKE 1 TABLET (0.1 MG TOTAL) BY MOUTH DAILY.  Marland Kitchen donepezil (ARICEPT) 10 MG tablet TAKE 1 TABLET AT BEDTIME  . gabapentin (NEURONTIN) 300 MG capsule Take 300 mg by mouth at bedtime.  Marland Kitchen glucose blood (ACCU-CHEK AVIVA) test strip  Use as instructed one daily testing dx e11.9  . hydrOXYzine (ATARAX/VISTARIL) 25 MG tablet Take 1 tablet (25 mg total) by mouth 3 (three) times daily as needed.  Marland Kitchen imipramine (TOFRANIL) 50 MG tablet TAKE 2 TABLETS AT BEDTIME  . meclizine (ANTIVERT) 25 MG tablet TAKE 1 TABLET THREE TIMES DAILY AS NEEDED FOR DIZZINESS.  . metFORMIN (GLUCOPHAGE) 500 MG tablet TAKE 1 TABLET TWICE DAILY WITH  MEALS  . montelukast (SINGULAIR) 10 MG tablet TAKE 1 TABLET (10 MG TOTAL) BY MOUTH AT BEDTIME.  Marland Kitchen omeprazole (PRILOSEC) 40 MG capsule TAKE 1 CAPSULE EVERY DAY  . ondansetron (ZOFRAN) 4 MG tablet Take 4 mg by mouth 2 (two) times daily as needed for nausea or vomiting.  Marland Kitchen oxyCODONE-acetaminophen (PERCOCET) 10-325 MG tablet One tablet every 8 hours for pain  . PRADAXA 150 MG CAPS capsule TAKE 1 CAPSULE EVERY 12 HOURS  . propranolol (INDERAL) 80 MG tablet TAKE 1 TABLET EVERY DAY  . SUMAtriptan (IMITREX) 5 MG/ACT nasal spray Place 1 spray (5 mg total) into the nose every 2 (two) hours as needed for migraine. One spray into each nostril , may repeat once after 2 hours. No more than 4 sprays in a 24 hour period. No more than twice weekly  . temazepam (RESTORIL) 30 MG capsule Take 1 capsule (30 mg total) by mouth at bedtime as needed for sleep.  Marland Kitchen topiramate (TOPAMAX) 100 MG tablet TAKE 1 TABLET TWICE DAILY   Assessment:  75 year old female living in [place] with her husband and for whom she shares caregiver responsibilities with her daughters. [name] has deficits in management of diabetes.    Knowledge Deficit related to self health management of diabetes - [name] has a new glucose monitor provided by Dr. Moshe Cipro. She had not been regularly checking her cbg's when I visited her last month. I demonstrated how to use the new monitor and advised her to check her cbg daily at different times of the day. Upon my return today, [name] was having difficulty performing CBG check independently. She said her daughter has been checking her cbg's for her. We spent some time today working on self monitoring and [name] was able to demonstrate improved ability to check her own cbg.   [name]'s monitor revealed the following findings:   121 114 161 128 152 133  Last HGA1C (9/13) = 6.6 ( 5 months ago = 7.6)  Level of Care Discussion - [name] and her husband asked to discuss with me today the  possibility of ALF or Senior Apartment housing. Mrs. Lotts says she feels that it is too much for her daughter to work all night and drive back and forth from [place] to provide assistance. I explained to Mrs.  Mrs. Sutliff the difference between senior apartment housing, assisted living, and skilled nursing facility. We also discussed in home care and the cost associated with custodial care. I offered to consult with our social work team and assist Mrs. Lasure with information related to change in level of care. Mrs. Sorci declined and said she first wanted to discuss this with her daughter.   Plan:   Mrs. Moffitt will continue practicing checking her own cbg's and will do this daily.   Mrs. Woodell will call me with any questions or concerns about self monitoring.   Mrs. Ange will call Dr. Moshe Cipro if she has a cbg finding less than 90 or greater than 225.   I will see Mrs. Zuercher at  home again in November for a home visit/follow up of DM.    [facility]  [phone]

## 2015-12-11 ENCOUNTER — Other Ambulatory Visit: Payer: Self-pay | Admitting: Family Medicine

## 2015-12-24 ENCOUNTER — Other Ambulatory Visit: Payer: Self-pay | Admitting: *Deleted

## 2015-12-24 NOTE — Patient Outreach (Signed)
[facility]) Care Management  12/24/2015  ARTHENIA PIWOWARCZYK April 30, 1940 [id number]   I arrived at Mrs. Rubel's home for our scheduled visit today. She did not answer the door when I knocked. I called and Mrs. Meraz answered but said she didn't feel up to a visit today and requested that I contact her later this week to reschedule.   Plan: I will reach out to Mrs. Dampier by phone this week to reschedule. This is the second occurrence of declination of scheduled visit without cancellation. If Mrs. Jensen is unable to visit at our next scheduled appointment, I will offer to have her case transferred to our telephonic case management team but will not be able to keep scheduling home visits.    [facility]  [phone]

## 2015-12-26 ENCOUNTER — Other Ambulatory Visit: Payer: Self-pay

## 2015-12-26 MED ORDER — OXYCODONE-ACETAMINOPHEN 10-325 MG PO TABS: ORAL_TABLET | ORAL | 0 refills | Status: DC

## 2016-01-13 ENCOUNTER — Other Ambulatory Visit: Payer: Self-pay | Admitting: *Deleted

## 2016-01-13 NOTE — Patient Outreach (Signed)
[facility]) Care Management  01/13/2016  [name] 31-Aug-1940 [id number]  I began seeing [name] in September for assistance with Diabetes Management/Education and Coordination. Unfortunately, [name] has been unable to keep more than half of her scheduled appointments with me. I spoke with her by phone this afternoon. She told me that he husband is now receiving Hospice services at home and that she "thinks her blood sugar is doing okay". [name] is scheduled to see Denman George LPN at Dr. Dorcas Carrow office tomorrow.   Plan: I will reach out to Ms. Javier Glazier to collaborate re: [name] needs and a plan moving forward to provide assistance with nursing case management needs for her.    [facility]  [phone]

## 2016-01-14 ENCOUNTER — Telehealth: Payer: Self-pay

## 2016-01-14 ENCOUNTER — Ambulatory Visit (INDEPENDENT_AMBULATORY_CARE_PROVIDER_SITE_OTHER): Payer: Commercial Managed Care - HMO

## 2016-01-14 VITALS — BP 126/70 | HR 76 | Resp 18 | Ht 67.0 in | Wt 220.0 lb

## 2016-01-14 DIAGNOSIS — Z0000 Encounter for general adult medical examination without abnormal findings: Secondary | ICD-10-CM

## 2016-01-14 DIAGNOSIS — G894 Chronic pain syndrome: Secondary | ICD-10-CM | POA: Diagnosis not present

## 2016-01-14 NOTE — Telephone Encounter (Signed)
pls let her know that I am sorry to hear of spouse's failing health. Assure her that with support from  Hospice her anxiety and stress will be lessened. No additional medication recommended at this time , may increase dose of buspar  Which she is already on, but I recommend giving herself 2 to 3 weeks to see how support from hospice helps with stress and anxiety symptoms

## 2016-01-14 NOTE — Patient Instructions (Signed)
Thank you for choosing [facility] for your health care needs  The Annual Wellness Visit is designed to allow [place] the chance to assist you in preserving and improving you health.   Dr. Moshe Cipro will see you back in January to discuss pain medication and the changes that will be made due to the government  Any labs will be given to you in January  The name of the medicines for itch are Acyclovir which is prescription and on file at [facility] and Monistat which is over the counter   I will call and let you know what Dr. Moshe Cipro says about something different for your nerves

## 2016-01-14 NOTE — Progress Notes (Signed)
Will address with patient if she keeps appointment.

## 2016-01-15 ENCOUNTER — Other Ambulatory Visit: Payer: Self-pay | Admitting: Family Medicine

## 2016-01-15 MED ORDER — BUSPIRONE HCL 7.5 MG PO TABS: 7.5000 mg | ORAL_TABLET | Freq: Three times a day (TID) | ORAL | 1 refills | Status: DC

## 2016-01-15 MED ORDER — CYCLOBENZAPRINE HCL 10 MG PO TABS: ORAL_TABLET | ORAL | 3 refills | Status: DC

## 2016-01-15 NOTE — Telephone Encounter (Signed)
Higher dose printed , please notify and send

## 2016-01-15 NOTE — Telephone Encounter (Signed)
Sister aware and spoke with patient while on the phone.  Patient would like increase of Buspar.

## 2016-01-15 NOTE — Progress Notes (Addendum)
Subjective:    [name] is a 75 y.o. female who presents for Medicare Annual/Subsequent preventive examination. C/o difficulty sleeping due to discomfort from generalized severe arthritic pain affecting shouders, spine , hips and knees. Though she has ahd some joint surgery pain is not alleviated and seems to be worsening, requests hospital bed , which would be advantageous. 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 Never Smoker  Smokeless Tobacco  . Never Used      Current Problems (verified) Patient Active Problem List   Diagnosis Date Noted  . Encounter for medication management 10/29/2015  . Vaginitis and vulvovaginitis 10/29/2015  . Bilateral knee pain 04/23/2014  . Type 2 diabetes mellitus with HbA1C goal below 8.0 11/15/2013  . Right-sided low back pain with right-sided sciatica 07/02/2013  . Seasonal allergies 04/11/2013  . Urinary incontinence 04/11/2013  . Metabolic syndrome X 82/99/3716  . Chronic anticoagulation 12/22/2012  . Vitamin D deficiency 11/05/2012  . Memory loss 11/01/2012  . Depression with anxiety 11/01/2012  . Anemia, iron deficiency   . Morbid obesity ([place]) 08/16/2008  . Hyperlipemia 05/30/2007  . Migraine 05/30/2007  . HTN (hypertension) 05/30/2007  . Gastroesophageal reflux disease 05/30/2007    Medications Prior to Visit Current Outpatient Prescriptions on File Prior to Visit  Medication Sig Dispense Refill  . ACCU-CHEK FASTCLIX LANCETS MISC Once daily testing dx e11.9 50 each 5  . acyclovir (ZOVIRAX) 400 MG tablet Take 1 tablet (400 mg total) by mouth 3 (three) times daily. 30 tablet 5  . albuterol (PROVENTIL HFA;VENTOLIN HFA) 108 (90 Base) MCG/ACT inhaler Inhale 2 puffs into the lungs every 6 (six) hours as needed for wheezing or shortness of breath. 1 Inhaler 0  . amLODipine (NORVASC) 10 MG  tablet TAKE 1 TABLET EVERY DAY 90 tablet 1  . atorvastatin (LIPITOR) 40 MG tablet Take 1 tablet (40 mg total) by mouth daily. 90 tablet 1  . Blood Glucose Monitoring Suppl (ACCU-CHEK AVIVA PLUS) w/Device KIT For once daily testing dx E11.9 1 kit 0  . busPIRone (BUSPAR) 5 MG tablet Take 1 tablet (5 mg total) by mouth 3 (three) times daily. 270 tablet 0  . cloNIDine (CATAPRES) 0.1 MG tablet TAKE 1 TABLET (0.1 MG TOTAL) BY MOUTH DAILY. 90 tablet 1  . donepezil (ARICEPT) 10 MG tablet TAKE 1 TABLET AT BEDTIME 90 tablet 1  . gabapentin (NEURONTIN) 300 MG capsule Take 300 mg by mouth at bedtime.    Marland Kitchen glucose blood (ACCU-CHEK AVIVA) test strip Use as instructed one daily testing dx e11.9 50 each 5  . hydrOXYzine (ATARAX/VISTARIL) 25 MG tablet Take 1 tablet (25 mg total) by mouth 3 (three) times daily as needed. 30 tablet 3  . imipramine (TOFRANIL) 50 MG tablet TAKE 2 TABLETS AT BEDTIME 180 tablet 1  . meclizine (ANTIVERT) 25 MG tablet TAKE 1 TABLET THREE TIMES DAILY AS NEEDED FOR DIZZINESS. 270 tablet 1  . metFORMIN (GLUCOPHAGE) 500 MG tablet TAKE 1 TABLET TWICE DAILY WITH MEALS 180 tablet 1  . montelukast (SINGULAIR) 10 MG tablet TAKE 1 TABLET (10 MG TOTAL) BY MOUTH AT BEDTIME. 90 tablet 1  . omeprazole (PRILOSEC) 40 MG capsule TAKE 1 CAPSULE EVERY DAY 90 capsule 1  . ondansetron (ZOFRAN) 4 MG tablet Take 4 mg by mouth 2 (two) times daily as needed for nausea or vomiting.    Marland Kitchen oxyCODONE-acetaminophen (PERCOCET) 10-325 MG tablet One tablet  every 8 hours for pain 90 tablet 0  . PRADAXA 150 MG CAPS capsule TAKE 1 CAPSULE EVERY 12 HOURS 180 capsule 3  . propranolol (INDERAL) 80 MG tablet TAKE 1 TABLET EVERY DAY 90 tablet 1  . SUMAtriptan (IMITREX) 5 MG/ACT nasal spray Place 1 spray (5 mg total) into the nose every 2 (two) hours as needed for migraine. One spray into each nostril , may repeat once after 2 hours. No more than 4 sprays in a 24 hour period. No more than twice weekly 1 Inhaler 2  . temazepam  (RESTORIL) 30 MG capsule Take 1 capsule (30 mg total) by mouth at bedtime as needed for sleep. 90 capsule 1  . topiramate (TOPAMAX) 100 MG tablet TAKE 1 TABLET TWICE DAILY 180 tablet 0   Current Facility-Administered Medications on File Prior to Visit  Medication Dose Route Frequency Provider Last Rate Last Dose  . fentaNYL (SUBLIMAZE) injection 25-50 mcg  25-50 mcg Intravenous Q5 min PRN Lerry Liner, MD        Current Medications (verified) Current Outpatient Prescriptions  Medication Sig Dispense Refill  . ACCU-CHEK FASTCLIX LANCETS MISC Once daily testing dx e11.9 50 each 5  . acyclovir (ZOVIRAX) 400 MG tablet Take 1 tablet (400 mg total) by mouth 3 (three) times daily. 30 tablet 5  . albuterol (PROVENTIL HFA;VENTOLIN HFA) 108 (90 Base) MCG/ACT inhaler Inhale 2 puffs into the lungs every 6 (six) hours as needed for wheezing or shortness of breath. 1 Inhaler 0  . amLODipine (NORVASC) 10 MG tablet TAKE 1 TABLET EVERY DAY 90 tablet 1  . atorvastatin (LIPITOR) 40 MG tablet Take 1 tablet (40 mg total) by mouth daily. 90 tablet 1  . Blood Glucose Monitoring Suppl (ACCU-CHEK AVIVA PLUS) w/Device KIT For once daily testing dx E11.9 1 kit 0  . busPIRone (BUSPAR) 5 MG tablet Take 1 tablet (5 mg total) by mouth 3 (three) times daily. 270 tablet 0  . cloNIDine (CATAPRES) 0.1 MG tablet TAKE 1 TABLET (0.1 MG TOTAL) BY MOUTH DAILY. 90 tablet 1  . cyclobenzaprine (FLEXERIL) 10 MG tablet TAKE ONE TABLET TWICE DAILY, AS NEEDED, FOR MUSCLE SPASM 60 tablet 3  . donepezil (ARICEPT) 10 MG tablet TAKE 1 TABLET AT BEDTIME 90 tablet 1  . gabapentin (NEURONTIN) 300 MG capsule Take 300 mg by mouth at bedtime.    Marland Kitchen glucose blood (ACCU-CHEK AVIVA) test strip Use as instructed one daily testing dx e11.9 50 each 5  . hydrOXYzine (ATARAX/VISTARIL) 25 MG tablet Take 1 tablet (25 mg total) by mouth 3 (three) times daily as needed. 30 tablet 3  . imipramine (TOFRANIL) 50 MG tablet TAKE 2 TABLETS AT BEDTIME 180 tablet 1   . meclizine (ANTIVERT) 25 MG tablet TAKE 1 TABLET THREE TIMES DAILY AS NEEDED FOR DIZZINESS. 270 tablet 1  . metFORMIN (GLUCOPHAGE) 500 MG tablet TAKE 1 TABLET TWICE DAILY WITH MEALS 180 tablet 1  . montelukast (SINGULAIR) 10 MG tablet TAKE 1 TABLET (10 MG TOTAL) BY MOUTH AT BEDTIME. 90 tablet 1  . omeprazole (PRILOSEC) 40 MG capsule TAKE 1 CAPSULE EVERY DAY 90 capsule 1  . ondansetron (ZOFRAN) 4 MG tablet Take 4 mg by mouth 2 (two) times daily as needed for nausea or vomiting.    Marland Kitchen oxyCODONE-acetaminophen (PERCOCET) 10-325 MG tablet One tablet every 8 hours for pain 90 tablet 0  . PRADAXA 150 MG CAPS capsule TAKE 1 CAPSULE EVERY 12 HOURS 180 capsule 3  . propranolol (INDERAL) 80 MG tablet TAKE 1  TABLET EVERY DAY 90 tablet 1  . SUMAtriptan (IMITREX) 5 MG/ACT nasal spray Place 1 spray (5 mg total) into the nose every 2 (two) hours as needed for migraine. One spray into each nostril , may repeat once after 2 hours. No more than 4 sprays in a 24 hour period. No more than twice weekly 1 Inhaler 2  . temazepam (RESTORIL) 30 MG capsule Take 1 capsule (30 mg total) by mouth at bedtime as needed for sleep. 90 capsule 1  . topiramate (TOPAMAX) 100 MG tablet TAKE 1 TABLET TWICE DAILY 180 tablet 0   No current facility-administered medications for this visit.    Facility-Administered Medications Ordered in Other Visits  Medication Dose Route Frequency Provider Last Rate Last Dose  . fentaNYL (SUBLIMAZE) injection 25-50 mcg  25-50 mcg Intravenous Q5 min PRN Lerry Liner, MD         Allergies (verified) Penicillins; Buspar [buspirone]; Fentanyl; Sulfonamide derivatives; and Codeine   PAST HISTORY  Family History Family History  Problem Relation Age of Onset  . Diabetes Mother   . Hypertension Mother   . Arthritis Mother   . Heart failure Mother   . Migraines Mother   . Kidney failure Mother   . Leukemia Father   . Migraines Father   . Kidney failure Father   . Diabetes Sister   .  Hypertension Sister   . Diabetes Brother   . Hypertension Brother   . Hypertension Brother   . Hypertension Brother   . Hypertension Brother   . Diabetes Brother   . Diabetes Brother   . Diabetes Brother   . Pulmonary embolism Sister   . Migraines Sister   . Kidney failure Brother   . Colon cancer Neg Hx   . Colon polyps Neg Hx     Social History Social History  Substance Use Topics  . Smoking status: Never Smoker  . Smokeless tobacco: Never Used  . Alcohol use No     Are there smokers in your home (other than you)? No  Risk Factors Current exercise habits: Exercise is limited by orthopedic condition(s): lumbar back pain .  Dietary issues discussed: changes with food labels and variety in diet    Cardiac risk factors: advanced age (older than 42 for men, 44 for women), dyslipidemia, hypertension and obesity (BMI >= 30 kg/m2).  Depression Screen (Note: if answer to either of the following is "Yes", a more complete depression screening is indicated) Husband recently admitted to hospice services.   Over the past two weeks, have you felt down, depressed or hopeless? No  Over the past two weeks, have you felt little interest or pleasure in doing things? Yes  Have you lost interest or pleasure in daily life? Yes  Do you often feel hopeless? Yes  Do you cry easily over simple problems? Yes  Activities of Daily Living In your present state of health, do you have any difficulty performing the following activities?:  Driving? Yes  Managing money?  Yes Feeding yourself? No Getting from bed to chair? No-does require the use of an assistive device.  Climbing a flight of stairs? Yes Preparing food and eating?: No Bathing or showering? No Getting dressed: No Getting to the toilet? No Using the toilet:No Moving around from place to place: Yes In the past year have you fallen or had a near fall?:No   Are you sexually active?  No  Do you have more than one partner?  N/a    Hearing Difficulties:  No Do you often ask people to speak up or repeat themselves? No Do you experience ringing or noises in your ears? No Do you have difficulty understanding soft or whispered voices? No   Do you feel that you have a problem with memory? Yes  Do you often misplace items? Yes  Do you feel safe at home?  Yes  Cognitive Testing  Alert? Yes  Normal Appearance?Yes  Oriented to person? Yes  Place? Yes   Time? Yes  Recall of three objects?  Yes  Can perform simple calculations? Yes  Displays appropriate judgment?Yes  Can read the correct time from a watch face?Yes   Advanced Directives have been discussed with the patient? Yes  List the Names of Other Physician/Practitioners you currently use: 1.  Dr. Jorja Loa (opthamology) 2.  Dr. Percell [name] (orthopedics) 3.  Dr. Laural Golden (GI)   Indicate any recent Medical Services you may have received from other than [facility] providers in the past year (date may be approximate).  Immunization History  Administered Date(s) Administered  . H1N1 12/19/2007  . Influenza Split 11/23/2011  . Influenza Whole 01/07/2004, 11/14/2007, 02/03/2009, 11/30/2009  . Influenza,inj,Quad PF,36+ Mos 11/01/2012, 01/01/2014, 04/24/2015, 10/29/2015  . Pneumococcal Conjugate-13 04/23/2014  . Pneumococcal Polysaccharide-23 08/05/2010  . Td 11/07/2003  . Zoster 04/12/2006    Screening Tests Health Maintenance  Topic Date Due  . URINE MICROALBUMIN  09/03/2015  . OPHTHALMOLOGY EXAM  12/10/2015  . TETANUS/TDAP  06/03/2016 (Originally 11/06/2013)  . HEMOGLOBIN A1C  04/27/2016  . FOOT EXAM  05/28/2016  . INFLUENZA VACCINE  Completed  . DEXA SCAN  Completed  . ZOSTAVAX  Completed  . PNA vac Low Risk Adult  Completed    All answers were reviewed with the patient and necessary referrals were made:  Vanetta Mulders, LPN   01/75/1025   History reviewed: allergies, current medications, past family history, past medical history, past social history,  past surgical history and problem list  Review of Systems A comprehensive review of systems was negative.    Objective:     Vision by Snellen chart: right eye:20/25, left eye:20/25  Body mass index is 34.46 kg/m. BP 126/70   Pulse 76   Resp 18   Ht _0  (1.702 m)   Wt 220 lb (99.8 kg)   SpO2 93%   BMI 34.46 kg/m   No exam performed today, annual wellness without physicial exam.     Assessment:      Medicare annual wellness visit, subsequent Annual exam as documented. Counseling done  re healthy lifestyle involving commitment to 150 minutes exercise per week, heart healthy diet, and attaining healthy weight.The importance of adequate sleep also discussed. Regular seat belt use and home safety, is also discussed. Changes in health habits are decided on by the patient with goals and time frames  set for achieving them. Immunization and cancer screening needs are specifically addressed at this visit.   Chronic pain syndrome Patient with established severe osteoarthritis involving multiple major joints, shoulders, spine , hips and knees, causing chronic pain. Hospital bed will alleviate pain by allowing shoulders, spine, hips and knees to be positioned in ways not feasible with a noormal bed. Pain episodes frequently require frequent  changes in body position which cannot be achieved with a normal bed      Plan:     During the course of the visit the patient was educated and counseled about appropriate screening and preventive services including:    Assistance with DME  Diet review for nutrition referral? Yes ____  Not Indicated _x___   Patient Instructions (the written plan) was given to the patient.  Medicare Attestation I have personally reviewed: The patient's medical and social history Their use of alcohol, tobacco or illicit drugs Their current medications and supplements The patient's functional ability including ADLs,fall risks, home safety risks,  cognitive, and hearing and visual impairment Diet and physical activities Evidence for depression or mood disorders  The patient's weight, height, BMI, and visual acuity have been recorded in the chart.  I have made referrals, counseling, and provided education to the patient based on review of the above and I have provided the patient with a written personalized care plan for preventive services.     Denman George [place], [place]   28/78/6767

## 2016-01-16 ENCOUNTER — Other Ambulatory Visit: Payer: Self-pay

## 2016-01-16 MED ORDER — BUSPIRONE HCL 7.5 MG PO TABS: 7.5000 mg | ORAL_TABLET | Freq: Three times a day (TID) | ORAL | 1 refills | Status: DC

## 2016-01-16 NOTE — Telephone Encounter (Signed)
Med sent to pharmacy.

## 2016-01-18 ENCOUNTER — Telehealth: Payer: Self-pay | Admitting: Family Medicine

## 2016-01-18 DIAGNOSIS — Z0000 Encounter for general adult medical examination without abnormal findings: Secondary | ICD-10-CM | POA: Insufficient documentation

## 2016-01-18 DIAGNOSIS — E559 Vitamin D deficiency, unspecified: Secondary | ICD-10-CM

## 2016-01-18 DIAGNOSIS — I10 Essential (primary) hypertension: Secondary | ICD-10-CM

## 2016-01-18 DIAGNOSIS — E119 Type 2 diabetes mellitus without complications: Secondary | ICD-10-CM

## 2016-01-18 DIAGNOSIS — E782 Mixed hyperlipidemia: Secondary | ICD-10-CM

## 2016-01-18 NOTE — Assessment & Plan Note (Signed)

## 2016-01-18 NOTE — Telephone Encounter (Signed)
pls needs microalb, fasting lipid, cmp and EGFr, hBA1C and Vit D 1 week prior to Jan appt , thanks 

## 2016-01-23 ENCOUNTER — Other Ambulatory Visit: Payer: Self-pay

## 2016-01-23 MED ORDER — OXYCODONE-ACETAMINOPHEN 10-325 MG PO TABS: ORAL_TABLET | ORAL | 0 refills | Status: DC

## 2016-01-27 NOTE — Addendum Note (Signed)
Addended by: Denman George B on: 01/27/2016 11:10 AM   Modules accepted: Orders

## 2016-01-28 ENCOUNTER — Ambulatory Visit: Payer: Self-pay | Admitting: *Deleted

## 2016-01-30 ENCOUNTER — Ambulatory Visit: Payer: Self-pay | Admitting: *Deleted

## 2016-02-03 ENCOUNTER — Other Ambulatory Visit: Payer: Self-pay

## 2016-02-03 DIAGNOSIS — G894 Chronic pain syndrome: Secondary | ICD-10-CM | POA: Insufficient documentation

## 2016-02-03 NOTE — Assessment & Plan Note (Signed)
Patient with established severe osteoarthritis involving multiple major joints, shoulders, spine , hips and knees, causing chronic pain. Hospital bed will alleviate pain by allowing shoulders, spine, hips and knees to be positioned in ways not feasible with a noormal bed. Pain episodes frequently require frequent  changes in body position which cannot be achieved with a normal bed

## 2016-02-12 ENCOUNTER — Telehealth: Payer: Self-pay | Admitting: Family Medicine

## 2016-02-12 NOTE — Telephone Encounter (Signed)
Spoke with sister.   She is aware that patient has labs already ordered that can be done now.  Also updated sister on status of hospital bed.  # to [facility] given

## 2016-02-12 NOTE — Telephone Encounter (Signed)
[name] is calling asking if Dr. Moshe Cipro would check her A1c they seem to think that her blood sugar is dropping , please advise?

## 2016-02-13 DIAGNOSIS — E119 Type 2 diabetes mellitus without complications: Secondary | ICD-10-CM | POA: Diagnosis not present

## 2016-02-13 DIAGNOSIS — E782 Mixed hyperlipidemia: Secondary | ICD-10-CM | POA: Diagnosis not present

## 2016-02-13 DIAGNOSIS — I10 Essential (primary) hypertension: Secondary | ICD-10-CM | POA: Diagnosis not present

## 2016-02-13 DIAGNOSIS — E559 Vitamin D deficiency, unspecified: Secondary | ICD-10-CM | POA: Diagnosis not present

## 2016-02-14 ENCOUNTER — Other Ambulatory Visit: Payer: Self-pay | Admitting: Family Medicine

## 2016-02-14 LAB — MICROALBUMIN / CREATININE URINE RATIO
Creatinine, Urine: 172 mg/dL (ref 20–320)
Microalb Creat Ratio: 395 mcg/mg creat — ABNORMAL HIGH (ref <30)
Microalb, Ur: 67.9 mg/dL

## 2016-02-14 LAB — COMPLETE METABOLIC PANEL WITH GFR
ALBUMIN: 4.2 g/dL (ref 3.6–5.1)
ALK PHOS: 92 U/L (ref 33–130)
ALT: 15 U/L (ref 6–29)
AST: 12 U/L (ref 10–35)
BILIRUBIN TOTAL: 0.3 mg/dL (ref 0.2–1.2)
BUN: 13 mg/dL (ref 7–25)
CALCIUM: 9.5 mg/dL (ref 8.6–10.4)
CO2: 26 mmol/L (ref 20–31)
Chloride: 104 mmol/L (ref 98–110)
Creat: 0.93 mg/dL (ref 0.60–0.93)
GFR, EST AFRICAN AMERICAN: 69 mL/min (ref >=60)
GFR, EST NON AFRICAN AMERICAN: 60 mL/min (ref >=60)
GLUCOSE: 153 mg/dL — AB (ref 65–99)
Potassium: 4.4 mmol/L (ref 3.5–5.3)
Sodium: 140 mmol/L (ref 135–146)
TOTAL PROTEIN: 7.2 g/dL (ref 6.1–8.1)

## 2016-02-14 LAB — LIPID PANEL
Cholesterol: 148 mg/dL (ref <200)
HDL: 61 mg/dL (ref >50)
LDL CALC: 57 mg/dL (ref <100)
TRIGLYCERIDES: 152 mg/dL — AB (ref <150)
Total CHOL/HDL Ratio: 2.4 Ratio (ref <5.0)
VLDL: 30 mg/dL (ref <30)

## 2016-02-14 LAB — HEMOGLOBIN A1C
HEMOGLOBIN A1C: 7 % — AB (ref <5.7)
Mean Plasma Glucose: 154 mg/dL

## 2016-02-14 LAB — VITAMIN D 25 HYDROXY (VIT D DEFICIENCY, FRACTURES): Vit D, 25-Hydroxy: 21 ng/mL — ABNORMAL LOW (ref 30–100)

## 2016-02-17 ENCOUNTER — Encounter: Payer: Self-pay | Admitting: Family Medicine

## 2016-02-27 ENCOUNTER — Other Ambulatory Visit: Payer: Self-pay

## 2016-02-27 MED ORDER — TOPIRAMATE 100 MG PO TABS: 100.0000 mg | ORAL_TABLET | Freq: Two times a day (BID) | ORAL | 0 refills | Status: DC

## 2016-03-10 ENCOUNTER — Ambulatory Visit: Payer: Self-pay | Admitting: Family Medicine

## 2016-03-19 ENCOUNTER — Other Ambulatory Visit: Payer: Self-pay | Admitting: *Deleted

## 2016-03-19 NOTE — Patient Outreach (Signed)
[facility]) Care Management  03/19/2016  ANALUZ OH [birth year] [id number]  CHADAE MARINA is an 76 y.o. female with history of hypertension, type II Diabetes, hyperlipidemia, and migraine headache. Mrs. Matzek was referred to [place] [facility] for assistance with medication management and chronic disease management and care coordination. In particular, Dr. Moshe Cipro has asked that we assist Mrs. Magallanes with Diabetes management. Mrs. Canan doesn't check her cbg and has significant knowledge deficits related to diabetes management.   Mrs. Napoles lives in [place] with her husband and is independent with ADL's. She uses 4 prong cane to get around because of back pain. Mrs. Medico has had 2 emergency room visits in July for back pain, hip &knee pain and is currently taking OxyContin, disposing her to higher risk for fall.   Mrs. Shad's daughter helps her with transportation, home management, and fills her medication boxes weekly. She is receivingmedications from a local pharmacy because she was not getting medications on time when using mail order.  Mrs. Brunton enjoys the benefit of Meals on Wheels.   I began seeing Mrs. Solinger in September for assistance with Diabetes Management/Education and Coordination. Unfortunately, Mrs. Cropley has been unable to keep more than half of her scheduled appointments with me. I spoke with her by phone this afternoon. She told me that he husband was  receiving Hospice services at home and that she "thinks her blood sugar is doing okay" when we spoke in December. I notified Denman George LPN at Dr. Griffin Dakin office of my difficulty maintaining contact with Mrs. Schild. She was seen in the office the following day for her annual wellness visit.   I reached out to Mrs. Schnurr again today to inquire about her progress with diabetes management and to see if she had interest in continuing [place] [facility] services. I was unable to reach her so will try  again on Monday.    [place] [facility]  [phone]

## 2016-03-22 ENCOUNTER — Other Ambulatory Visit: Payer: Self-pay | Admitting: *Deleted

## 2016-03-22 ENCOUNTER — Other Ambulatory Visit: Payer: Self-pay | Admitting: Family Medicine

## 2016-03-22 NOTE — Patient Outreach (Signed)
[facility]) Care Management  03/22/2016  ACHAIA BORNT 08-29-1940 [id number]  I was unable to reach Mrs. Meyer this afternoon when I called. I left a HIPPA compliant voice message requesting a return call.   Plan: I will reach out to Mrs. Doerr by phone next week if I do not hear from her.    [place] [facility]  [phone]

## 2016-03-30 ENCOUNTER — Other Ambulatory Visit: Payer: Self-pay | Admitting: *Deleted

## 2016-03-30 NOTE — Patient Outreach (Signed)
[facility]) Care Management  03/30/2016  KATERYNA CUELLAR 1940-09-04 [id number]  Unable to reach Mrs. Strausbaugh by phone. I will reach out to her once more and if I cannot make contact with her, I will notify her provider and Mrs. Cottingham by letter that we are closing her case.    [place] [facility]  [phone]

## 2016-03-31 ENCOUNTER — Other Ambulatory Visit: Payer: Self-pay | Admitting: *Deleted

## 2016-03-31 ENCOUNTER — Encounter: Payer: Self-pay | Admitting: *Deleted

## 2016-03-31 NOTE — Patient Outreach (Signed)
[facility]) Care Management  03/31/2016  EMBERLEIGH THAPA 1940/11/26 [id number]  Unable to reach Mrs. Dalby by phone again today. I left a HIPPA compliant voice message requesting a return call.   Plan: I will send a letter to Mrs. Tryon to inquire about her continued interest in [facility] CM services. If I do not hear from her in 10 days, I will close her case.    [place] [facility]  [phone]

## 2016-04-08 ENCOUNTER — Other Ambulatory Visit: Payer: Self-pay | Admitting: Family Medicine

## 2016-04-08 DIAGNOSIS — M5441 Lumbago with sciatica, right side: Secondary | ICD-10-CM

## 2016-04-12 ENCOUNTER — Encounter: Payer: Self-pay | Admitting: *Deleted

## 2016-04-12 ENCOUNTER — Other Ambulatory Visit: Payer: Self-pay | Admitting: *Deleted

## 2016-04-12 ENCOUNTER — Ambulatory Visit (INDEPENDENT_AMBULATORY_CARE_PROVIDER_SITE_OTHER): Payer: PPO | Admitting: Family Medicine

## 2016-04-12 ENCOUNTER — Encounter: Payer: Self-pay | Admitting: Family Medicine

## 2016-04-12 VITALS — BP 132/82 | HR 87 | Resp 15 | Ht 67.0 in | Wt 229.0 lb

## 2016-04-12 DIAGNOSIS — R413 Other amnesia: Secondary | ICD-10-CM

## 2016-04-12 DIAGNOSIS — E8881 Metabolic syndrome: Secondary | ICD-10-CM | POA: Diagnosis not present

## 2016-04-12 DIAGNOSIS — F418 Other specified anxiety disorders: Secondary | ICD-10-CM | POA: Diagnosis not present

## 2016-04-12 DIAGNOSIS — G43109 Migraine with aura, not intractable, without status migrainosus: Secondary | ICD-10-CM

## 2016-04-12 DIAGNOSIS — I10 Essential (primary) hypertension: Secondary | ICD-10-CM

## 2016-04-12 DIAGNOSIS — E669 Obesity, unspecified: Secondary | ICD-10-CM | POA: Diagnosis not present

## 2016-04-12 DIAGNOSIS — G894 Chronic pain syndrome: Secondary | ICD-10-CM | POA: Diagnosis not present

## 2016-04-12 DIAGNOSIS — E782 Mixed hyperlipidemia: Secondary | ICD-10-CM

## 2016-04-12 DIAGNOSIS — E1169 Type 2 diabetes mellitus with other specified complication: Secondary | ICD-10-CM

## 2016-04-12 MED ORDER — OXYCODONE-ACETAMINOPHEN 10-325 MG PO TABS: ORAL_TABLET | ORAL | 0 refills | Status: DC

## 2016-04-12 MED ORDER — TEMAZEPAM 30 MG PO CAPS: 30.0000 mg | ORAL_CAPSULE | Freq: Every evening | ORAL | 5 refills | Status: DC | PRN

## 2016-04-12 MED ORDER — BUSPIRONE HCL 7.5 MG PO TABS: 7.5000 mg | ORAL_TABLET | Freq: Three times a day (TID) | ORAL | 4 refills | Status: DC

## 2016-04-12 MED ORDER — CYCLOBENZAPRINE HCL 10 MG PO TABS: ORAL_TABLET | ORAL | 5 refills | Status: DC

## 2016-04-12 MED ORDER — ALBUTEROL SULFATE (2.5 MG/3ML) 0.083% IN NEBU
2.5000 mg | INHALATION_SOLUTION | Freq: Four times a day (QID) | RESPIRATORY_TRACT | 1 refills | Status: DC | PRN
Start: 2016-04-12 — End: 2016-07-14

## 2016-04-12 NOTE — Assessment & Plan Note (Signed)
Controlled, no change in medication  

## 2016-04-12 NOTE — Assessment & Plan Note (Signed)
Deteriorated. Patient re-educated about  the importance of commitment to a  minimum of 150 minutes of exercise per week.  The importance of healthy food choices with portion control discussed. Encouraged to start a food diary, count calories and to consider  joining a support group. Sample diet sheets offered. Goals set by the patient for the next several months.   Weight /BMI 04/12/2016 01/14/2016 10/29/2015  WEIGHT 229 lb 220 lb 220 lb  HEIGHT 5\' 7"  5\' 7"  5\' 7"   BMI 35.87 kg/m2 34.46 kg/m2 34.46 kg/m2

## 2016-04-12 NOTE — Progress Notes (Signed)
[name]     MRN: [id number]      DOB: [birth year]   HPI [name] is here for follow up and re-evaluation of chronic medical conditions, REVIEW PAIN POLICY medication management and review of any available recent lab and radiology data.  Preventive health is updated, specifically  Cancer screening and Immunization.   Questions or concerns regarding consultations or procedures which the PT has had in the interim are  addressed. C/O UNCONTROLLED BACK PAIN, WANTS INJECTION "BUT NOIT THE EPIDURAL SHE HAS HAD IN THE PAST, AND NO INTEREST IN BACK SURGERY. C/O POOOR SLEEP AND ANXIETY, HAS NO RESTOIL AND HAS  NOT BEEN TAKING THE BUSPAR AS PRESCRIBED ROS Denies recent fever or chills. Denies sinus pressure, nasal congestion, ear pain or sore throat. Denies chest congestion, productive cough or wheezing. Denies chest pains, palpitations and leg swelling Denies abdominal pain, nausea, vomiting,diarrhea or constipation.   Denies dysuria, frequency, hesitancy or incontinence. Denies headaches, seizures, Denies skin break down or rash.   PE  BP 132/82   Pulse 87   Resp 15   Ht 5\' 7"  (1.702 m)   Wt 229 lb (103.9 kg)   SpO2 96%   BMI 35.87 kg/m   Patient alert and oriented and in no cardiopulmonary distress.  HEENT: No facial asymmetry, EOMI,   oropharynx pink and moist.  Neck supple no JVD, no mass.  Chest: Clear to auscultation bilaterally.  CVS: S1, S2 no murmurs, no S3.Regular rate.  ABD: Soft non tender.   Ext: No edema  [id number] ROM spine, shoulders, hips and knees.  Skin: Intact, no ulcerations or rash noted.  Psych: Good eye contact, normal affect. Memory  IMPAIRED anxious NOT  depressed appearing.  CNS: CN 2-12 intact, power,  normal throughout.no focal deficits noted.   Assessment & Plan  Chronic pain syndrome PAIN NEEDS AND LEVEL OF CONTROL AND FUNCTION ASSESSED. MEDICATION MANAGEMENT REVIEWED AND CHANGED TO [facility] APPROPRIATE DOSE SCHEDULE, BASED ON  USAGE MEDICATION REGISTRY REWVIEWED PAIN CONTRACT REVIEWED AND SIGNED WILL REFER TO ALTERNATE PAIN CLINIC IF INDICATED.   Depression with anxiety RE EDUCATED PT RE NEED TO TAKE BUSPAR as prescribed, ad explained again that benzo use and narc pain use are increasing risk for respiratory depression , as she was asking for xanax. Voiced understanding  HTN (hypertension) Controlled, no change in medication DASH diet and commitment to daily physical activity for a minimum of 30 minutes discussed and encouraged, as a part of hypertension management. The importance of attaining a healthy weight is also discussed.  BP/Weight 04/12/2016 01/14/2016 12/09/2015 11/11/2015 10/29/2015 [id number]  Systolic BP Q000111Q 123XX123 A999333 [id number] Q000111Q 123456  Diastolic BP 82 70 92 72 80 78 67  Wt. (Lbs) 229 220 - - 220 - 214  BMI 35.87 34.46 - - 34.46 - 33.51       Hyperlipemia Hyperlipidemia:Low fat diet discussed and encouraged.   Lipid Panel  Lab Results  Component Value Date   CHOL 148 02/13/2016   HDL 61 02/13/2016   LDLCALC 57 02/13/2016   TRIG 152 (H) 02/13/2016   CHOLHDL 2.4 02/13/2016   Needs to reduce fat in diet Updated lab needed at/ before next visit.     Memory loss Continue aricept daily  Migraine Controlled, no change in medication   Morbid obesity Deteriorated. Patient re-educated about  the importance of commitment to a  minimum of 150 minutes of exercise per week.  The importance of healthy food choices with portion control  discussed. Encouraged to start a food diary, count calories and to consider  joining a support group. Sample diet sheets offered. Goals set by the patient for the next several months.   Weight /BMI 04/12/2016 01/14/2016 10/29/2015  WEIGHT 229 lb 220 lb 220 lb  HEIGHT 5\' 7"  5\' 7"  5\' 7"   BMI 35.87 kg/m2 34.46 kg/m2 34.46 kg/m2

## 2016-04-12 NOTE — Assessment & Plan Note (Signed)
PAIN NEEDS AND LEVEL OF CONTROL AND FUNCTION ASSESSED. MEDICATION MANAGEMENT REVIEWED AND CHANGED TO [facility] APPROPRIATE DOSE SCHEDULE, BASED ON USAGE MEDICATION REGISTRY REWVIEWED PAIN CONTRACT REVIEWED AND SIGNED WILL REFER TO ALTERNATE PAIN CLINIC IF INDICATED.

## 2016-04-12 NOTE — Assessment & Plan Note (Signed)
RE EDUCATED PT RE NEED TO TAKE BUSPAR as prescribed, ad explained again that benzo use and narc pain use are increasing risk for respiratory depression , as she was asking for xanax. Voiced understanding

## 2016-04-12 NOTE — Assessment & Plan Note (Signed)
Hyperlipidemia:Low fat diet discussed and encouraged.   Lipid Panel  Lab Results  Component Value Date   CHOL 148 02/13/2016   HDL 61 02/13/2016   LDLCALC 57 02/13/2016   TRIG 152 (H) 02/13/2016   CHOLHDL 2.4 02/13/2016   Needs to reduce fat in diet Updated lab needed at/ before next visit.

## 2016-04-12 NOTE — Patient Instructions (Addendum)
Annual exam in 3 months, call if you need me before  Fasting lipid, cmp and EGFR , HBa1C in 3 month  You are referred to Pain center in [facility] for epidural, if indicated  Also for eye exam  Medications as printed  Pain contract as discussed   Will check with Dr Saintclair Halsted re potential injection  Please work on good  health habits so that your health will improve. 1. Commitment to daily physical activity for 30 to 60  minutes, if you are able to do this.  2. Commitment to wise food choices. Aim for half of your  food intake to be vegetable and fruit, one quarter starchy foods, and one quarter protein. Try to eat on a regular schedule  3 meals per day, snacking between meals should be limited to vegetables or fruits or small portions of nuts. 64 ounces of water per day is generally recommended, unless you have specific health conditions, like heart failure or kidney failure where you will need to limit fluid intake.  3. Commitment to sufficient and a  good quality of physical and mental rest daily, generally between 6 to 8 hours per day.  WITH PERSISTANCE AND PERSEVERANCE, THE IMPOSSIBLE , BECOMES THE NORM!

## 2016-04-12 NOTE — Patient Outreach (Signed)
[facility]) Care Management  04/12/2016  ARYIAH NUANEZ [birth year] [id number]  I have been unable to reach Mrs. Lutzke or her family over the last several weeks by phone. I sent a letter to her home expressing our willingness to continue to work with her for case management and care coordination services if she so wished. I have not heard from Mrs. Estrella and therefore will close her case.   I have notified Mrs. Lewinski's primary care provider of her case closure. We are happy to provide assistance to Mrs. Wuertz at any time in the future should she be in need of case management services.   Plan: Case Closure.    [place] [facility]  [phone]

## 2016-04-12 NOTE — Assessment & Plan Note (Signed)
Continue aricept daily

## 2016-04-12 NOTE — Assessment & Plan Note (Signed)
Controlled, no change in medication DASH diet and commitment to daily physical activity for a minimum of 30 minutes discussed and encouraged, as a part of hypertension management. The importance of attaining a healthy weight is also discussed.  BP/Weight 04/12/2016 01/14/2016 12/09/2015 11/11/2015 10/29/2015 A999333 A999333  Systolic BP Q000111Q 123XX123 A999333 123456 [id number] Q000111Q 123456  Diastolic BP 82 70 92 72 80 78 67  Wt. (Lbs) 229 220 - - 220 - 214  BMI 35.87 34.46 - - 34.46 - 33.51

## 2016-04-13 ENCOUNTER — Other Ambulatory Visit: Payer: Self-pay

## 2016-04-13 ENCOUNTER — Telehealth: Payer: Self-pay | Admitting: Family Medicine

## 2016-04-13 MED ORDER — HYDROCOD POLST-CPM POLST ER 10-8 MG/5ML PO SUER: 5.0000 mL | Freq: Every evening | ORAL | 0 refills | Status: DC | PRN

## 2016-04-13 NOTE — Telephone Encounter (Signed)
Medication refilled and awaiting signature.

## 2016-04-13 NOTE — Telephone Encounter (Signed)
Please advise 

## 2016-04-13 NOTE — Telephone Encounter (Signed)
Debbi is asking for cough syrup Tussinex to help her cough , please advise?

## 2016-04-13 NOTE — Telephone Encounter (Signed)
pls refillx 1 

## 2016-05-02 ENCOUNTER — Other Ambulatory Visit: Payer: Self-pay | Admitting: Family Medicine

## 2016-05-06 ENCOUNTER — Other Ambulatory Visit: Payer: Self-pay | Admitting: Family Medicine

## 2016-05-13 DIAGNOSIS — E119 Type 2 diabetes mellitus without complications: Secondary | ICD-10-CM | POA: Diagnosis not present

## 2016-05-13 DIAGNOSIS — H401133 Primary open-angle glaucoma, bilateral, severe stage: Secondary | ICD-10-CM | POA: Diagnosis not present

## 2016-05-21 ENCOUNTER — Other Ambulatory Visit: Payer: Self-pay

## 2016-05-21 ENCOUNTER — Other Ambulatory Visit: Payer: Self-pay | Admitting: Family Medicine

## 2016-05-21 ENCOUNTER — Telehealth: Payer: Self-pay

## 2016-05-21 MED ORDER — HYDROCOD POLST-CPM POLST ER 10-8 MG/5ML PO SUER: 5.0000 mL | Freq: Every evening | ORAL | 0 refills | Status: DC | PRN

## 2016-05-21 MED ORDER — PREDNISONE 5 MG PO TABS: 5.0000 mg | ORAL_TABLET | Freq: Two times a day (BID) | ORAL | 0 refills | Status: AC

## 2016-05-21 NOTE — Telephone Encounter (Signed)
[name] called in and said that [name] had been coughing with a sore throat for over a week or two. No fever but is wanting something called in

## 2016-05-21 NOTE — Telephone Encounter (Signed)
Five day course of prednisone prescribed tussionex printed for collection next week if needed

## 2016-05-24 NOTE — Telephone Encounter (Signed)
No further call re cough, so script for cough suppressant trashed

## 2016-05-26 ENCOUNTER — Other Ambulatory Visit: Payer: Self-pay | Admitting: Family Medicine

## 2016-05-28 DIAGNOSIS — M545 Low back pain: Secondary | ICD-10-CM | POA: Diagnosis not present

## 2016-05-30 ENCOUNTER — Other Ambulatory Visit: Payer: Self-pay | Admitting: Family Medicine

## 2016-07-06 ENCOUNTER — Other Ambulatory Visit: Payer: Self-pay | Admitting: Family Medicine

## 2016-07-14 ENCOUNTER — Ambulatory Visit (INDEPENDENT_AMBULATORY_CARE_PROVIDER_SITE_OTHER): Payer: PPO | Admitting: Family Medicine

## 2016-07-14 ENCOUNTER — Encounter: Payer: Self-pay | Admitting: Family Medicine

## 2016-07-14 VITALS — BP 138/80 | HR 80 | Temp 97.7°F | Resp 18 | Ht 67.0 in | Wt 232.0 lb

## 2016-07-14 DIAGNOSIS — E119 Type 2 diabetes mellitus without complications: Secondary | ICD-10-CM

## 2016-07-14 DIAGNOSIS — G894 Chronic pain syndrome: Secondary | ICD-10-CM

## 2016-07-14 DIAGNOSIS — E559 Vitamin D deficiency, unspecified: Secondary | ICD-10-CM

## 2016-07-14 DIAGNOSIS — Z0000 Encounter for general adult medical examination without abnormal findings: Secondary | ICD-10-CM

## 2016-07-14 DIAGNOSIS — B369 Superficial mycosis, unspecified: Secondary | ICD-10-CM

## 2016-07-14 DIAGNOSIS — I10 Essential (primary) hypertension: Secondary | ICD-10-CM | POA: Diagnosis not present

## 2016-07-14 MED ORDER — OXYCODONE-ACETAMINOPHEN 10-325 MG PO TABS: 1.0000 | ORAL_TABLET | Freq: Three times a day (TID) | ORAL | 0 refills | Status: DC | PRN

## 2016-07-14 MED ORDER — NYSTATIN 100000 UNIT/GM EX POWD: Freq: Two times a day (BID) | CUTANEOUS | 1 refills | Status: DC

## 2016-07-14 MED ORDER — OXYCODONE-ACETAMINOPHEN 10-325 MG PO TABS: 1.0000 | ORAL_TABLET | Freq: Two times a day (BID) | ORAL | 0 refills | Status: DC

## 2016-07-14 MED ORDER — CLOTRIMAZOLE-BETAMETHASONE 1-0.05 % EX CREA: TOPICAL_CREAM | CUTANEOUS | 1 refills | Status: DC

## 2016-07-14 NOTE — Progress Notes (Signed)
[name]     MRN: [id number]      DOB: 10-29-1940  HPI: Patient is in for annual physical exam. C/o generalized pain, already being treated for this C/o rash under breasts which itches Labs on day of visit to be reviewed after  Immunization is reviewed   PE: BP 138/80 (BP Location: Left Arm, Patient Position: Sitting, Cuff Size: Normal)   Pulse 80   Temp 97.7 F (36.5 C) (Temporal)   Resp 18   Ht 5\' 7"  (1.702 m)   Wt 232 lb (105.2 kg)   SpO2 96%   BMI 36.34 kg/m   Pleasant  female, alert and oriented x 3, in no cardio-pulmonary distress. Afebrile. HEENT No facial trauma or asymetry. Sinuses non tender.  Extra occullar muscles intact, pupils equally reactive to light. External ears normal, tympanic membranes clear. Oropharynx moist, no exudate. Neck: supple, no adenopathy,JVD or thyromegaly.No bruits.  Chest: Clear to ascultation bilaterally.No crackles or wheezes. Non tender to palpation  Breast: No asymetry,no masses or lumps. No tenderness. No nipple discharge or inversion. No axillary or supraclavicular adenopathy  Cardiovascular system; Heart sounds normal,  S1 and  S2 ,no S3.  No murmur, or thrill. Apical beat not displaced Peripheral pulses normal.  Abdomen: Soft, non tender, no organomegaly or masses. No bruits. Bowel sounds normal. No guarding, tenderness or rebound.  Rectal:  Not examined Denies abdominal pain or change in BM, on chronic anticoagulation,no stool testing desired or indicated  GU: Not examined   Musculoskeletal exam: Decreased  ROM of spine, hips , shoulders and knees.  deformity ,swelling and  crepitus noted. No muscle wasting or atrophy.   Neurologic: Cranial nerves 2 to 12 intact. Power, tone ,sensation  normal throughout. disturbance in gait. No tremor.  Skin: Fungal and yeast rash under breasts Pigmentation normal throughout  Psych; Normal mood and affect. Judgement and concentration normal   Assessment &  Plan:  Annual physical exam Annual exam as documented. Counseling done  re healthy lifestyle involving commitment to 150 minutes exercise per week, heart healthy diet, and attaining healthy weight.The importance of adequate sleep also discussed.  Immunization and cancer screening needs are specifically addressed at this visit.   Dermatomycosis Affecting skin under both breasts, topical nystatin and clotrimazole/betamethasone twice daily prescribed Educated re need to keep area dry  Annual physical exam Annual exam as documented. Counseling done  re healthy lifestyle involving commitment to 150 minutes exercise per week, heart healthy diet, and attaining healthy weight.The importance of adequate sleep also discussed. Immunization and cancer screening needs are specifically addressed at this visit.   Chronic pain syndrome Pain management is assessed, pt reports that she has chronic generalized pain , however, sister states she is functional and she is notes to be somewhat sedated in visit, no indication to increase dose of ,medicationbased on her age and degree of mobility. Registry is reviewed Twelve weeks of medication is prescribed  Type 2 diabetes mellitus with hemoglobin A1c goal of less than 8.0% (HCC) Uncontrolled [name] is reminded of the importance of commitment to daily physical activity for 30 minutes or more, as able and the need to limit carbohydrate intake to 30 to 60 grams per meal to help with blood sugar control.   The need to take medication as prescribed, test blood sugar as directed, and to call between visits if there is a concern that blood sugar is uncontrolled is also discussed.   [name] is reminded of the importance  of daily foot exam, annual eye examination, and good blood sugar, blood pressure and cholesterol control.  Diabetic Labs Latest Ref Rng & Units 07/14/2016 02/13/2016 10/29/2015 05/19/2015 09/03/2014  HbA1c <5.7 % 8.4(H) 7.0(H) 6.6(H) 7.6(H) 7.5(H)    Microalbumin Not estab mg/dL - 67.9 - - 4.5(H)  Micro/Creat Ratio <30 mcg/mg creat - 395(H) - - 16.2  Chol <200 mg/dL 116 148 - 148 145  HDL >50 mg/dL 63 61 - 77 61  Calc LDL <100 mg/dL 25 57 - 46 56  Triglycerides <150 mg/dL 140 152(H) - 126 139  Creatinine 0.60 - 0.93 mg/dL 1.18(H) 0.93 1.09(H) 1.11(H) 0.97   BP/Weight 07/14/2016 04/12/2016 01/14/2016 12/09/2015 11/11/2015 10/29/2015 08/10/6387  Systolic BP 373 428 768 115 726 203 559  Diastolic BP 80 82 70 92 72 80 78  Wt. (Lbs) 232 229 220 - - 220 -  BMI 36.34 35.87 34.46 - - 34.46 -   Foot/eye exam completion dates Latest Ref Rng & Units 07/14/2016 05/29/2015  Eye Exam No Retinopathy - -  Foot exam Order - - -  Foot Form Completion - Done Done

## 2016-07-14 NOTE — Patient Instructions (Addendum)
F/u in 12 weeks,  Call if you need me before  Labs today, lipid, cmp and EGFR, , HBA1C asnd Vit D   Fall Prevention in the Home Falls can cause injuries. They can happen to people of all ages. There are many things you can do to make your home safe and to help prevent falls. What can I do on the outside of my home?  Regularly fix the edges of walkways and driveways and fix any cracks.  Remove anything that might make you trip as you walk through a door, such as a raised step or threshold.  Trim any bushes or trees on the path to your home.  Use bright outdoor lighting.  Clear any walking paths of anything that might make someone trip, such as rocks or tools.  Regularly check to see if handrails are loose or broken. Make sure that both sides of any steps have handrails.  Any raised decks and porches should have guardrails on the edges.  Have any leaves, snow, or ice cleared regularly.  Use sand or salt on walking paths during winter.  Clean up any spills in your garage right away. This includes oil or grease spills. What can I do in the bathroom?  Use night lights.  Install grab bars by the toilet and in the tub and shower. Do not use towel bars as grab bars.  Use non-skid mats or decals in the tub or shower.  If you need to sit down in the shower, use a plastic, non-slip stool.  Keep the floor dry. Clean up any water that spills on the floor as soon as it happens.  Remove soap buildup in the tub or shower regularly.  Attach bath mats securely with double-sided non-slip rug tape.  Do not have throw rugs and other things on the floor that can make you trip. What can I do in the bedroom?  Use night lights.  Make sure that you have a light by your bed that is easy to reach.  Do not use any sheets or blankets that are too big for your bed. They should not hang down onto the floor.  Have a firm chair that has side arms. You can use this for support while you get  dressed.  Do not have throw rugs and other things on the floor that can make you trip. What can I do in the kitchen?  Clean up any spills right away.  Avoid walking on wet floors.  Keep items that you use a lot in easy-to-reach places.  If you need to reach something above you, use a strong step stool that has a grab bar.  Keep electrical cords out of the way.  Do not use floor polish or wax that makes floors slippery. If you must use wax, use non-skid floor wax.  Do not have throw rugs and other things on the floor that can make you trip. What can I do with my stairs?  Do not leave any items on the stairs.  Make sure that there are handrails on both sides of the stairs and use them. Fix handrails that are broken or loose. Make sure that handrails are as long as the stairways.  Check any carpeting to make sure that it is firmly attached to the stairs. Fix any carpet that is loose or worn.  Avoid having throw rugs at the top or bottom of the stairs. If you do have throw rugs, attach them to the floor with carpet  tape.  Make sure that you have a light switch at the top of the stairs and the bottom of the stairs. If you do not have them, ask someone to add them for you. What else can I do to help prevent falls?  Wear shoes that:  Do not have high heels.  Have rubber bottoms.  Are comfortable and fit you well.  Are closed at the toe. Do not wear sandals.  If you use a stepladder:  Make sure that it is fully opened. Do not climb a closed stepladder.  Make sure that both sides of the stepladder are locked into place.  Ask someone to hold it for you, if possible.  Clearly mark and make sure that you can see:  Any grab bars or handrails.  First and last steps.  Where the edge of each step is.  Use tools that help you move around (mobility aids) if they are needed. These include:  Canes.  Walkers.  Scooters.  Crutches.  Turn on the lights when you go into a  dark area. Replace any light bulbs as soon as they burn out.  Set up your furniture so you have a clear path. Avoid moving your furniture around.  If any of your floors are uneven, fix them.  If there are any pets around you, be aware of where they are.  Review your medicines with your doctor. Some medicines can make you feel dizzy. This can increase your chance of falling. Ask your doctor what other things that you can do to help prevent falls. This information is not intended to replace advice given to you by your health care provider. Make sure you discuss any questions you have with your health care provider. Document Released: 11/28/2008 Document Revised: 07/10/2015 Document Reviewed: 03/08/2014 Elsevier Interactive Patient Education  2017 [employer].

## 2016-07-15 LAB — COMPLETE METABOLIC PANEL WITH GFR
ALT: 11 U/L (ref 6–29)
AST: 11 U/L (ref 10–35)
Albumin: 3.8 g/dL (ref 3.6–5.1)
Alkaline Phosphatase: 107 U/L (ref 33–130)
BUN: 13 mg/dL (ref 7–25)
CO2: 21 mmol/L (ref 20–31)
CREATININE: 1.18 mg/dL — AB (ref 0.60–0.93)
Calcium: 8.9 mg/dL (ref 8.6–10.4)
Chloride: 105 mmol/L (ref 98–110)
GFR, EST AFRICAN AMERICAN: 52 mL/min — AB (ref >=60)
GFR, Est Non African American: 45 mL/min — ABNORMAL LOW (ref >=60)
GLUCOSE: 148 mg/dL — AB (ref 65–99)
Potassium: 4.2 mmol/L (ref 3.5–5.3)
SODIUM: 139 mmol/L (ref 135–146)
TOTAL PROTEIN: 6.8 g/dL (ref 6.1–8.1)
Total Bilirubin: 0.2 mg/dL (ref 0.2–1.2)

## 2016-07-15 LAB — LIPID PANEL
Cholesterol: 116 mg/dL (ref <200)
HDL: 63 mg/dL (ref >50)
LDL CALC: 25 mg/dL (ref <100)
TRIGLYCERIDES: 140 mg/dL (ref <150)
Total CHOL/HDL Ratio: 1.8 Ratio (ref <5.0)
VLDL: 28 mg/dL (ref <30)

## 2016-07-15 LAB — HEMOGLOBIN A1C
Hgb A1c MFr Bld: 8.4 % — ABNORMAL HIGH (ref <5.7)
Mean Plasma Glucose: 194 mg/dL

## 2016-07-15 LAB — VITAMIN D 25 HYDROXY (VIT D DEFICIENCY, FRACTURES): VIT D 25 HYDROXY: 21 ng/mL — AB (ref 30–100)

## 2016-07-16 ENCOUNTER — Other Ambulatory Visit: Payer: Self-pay | Admitting: Family Medicine

## 2016-07-17 DIAGNOSIS — B369 Superficial mycosis, unspecified: Secondary | ICD-10-CM | POA: Insufficient documentation

## 2016-07-17 DIAGNOSIS — G8929 Other chronic pain: Secondary | ICD-10-CM | POA: Insufficient documentation

## 2016-07-17 NOTE — Assessment & Plan Note (Signed)
Uncontrolled [name] is reminded of the importance of commitment to daily physical activity for 30 minutes or more, as able and the need to limit carbohydrate intake to 30 to 60 grams per meal to help with blood sugar control.   The need to take medication as prescribed, test blood sugar as directed, and to call between visits if there is a concern that blood sugar is uncontrolled is also discussed.   [name] is reminded of the importance of daily foot exam, annual eye examination, and good blood sugar, blood pressure and cholesterol control.  Diabetic Labs Latest Ref Rng & Units 07/14/2016 02/13/2016 10/29/2015 05/19/2015 09/03/2014  HbA1c <5.7 % 8.4(H) 7.0(H) 6.6(H) 7.6(H) 7.5(H)  Microalbumin Not estab mg/dL - 67.9 - - 4.5(H)  Micro/Creat Ratio <30 mcg/mg creat - 395(H) - - 16.2  Chol <200 mg/dL 116 148 - 148 145  HDL >50 mg/dL 63 61 - 77 61  Calc LDL <100 mg/dL 25 57 - 46 56  Triglycerides <150 mg/dL 140 152(H) - 126 139  Creatinine 0.60 - 0.93 mg/dL 1.18(H) 0.93 1.09(H) 1.11(H) 0.97   BP/Weight 07/14/2016 04/12/2016 01/14/2016 12/09/2015 11/11/2015 10/29/2015 6/64/4034  Systolic BP 742 595 638 756 433 295 188  Diastolic BP 80 82 70 92 72 80 78  Wt. (Lbs) 232 229 220 - - 220 -  BMI 36.34 35.87 34.46 - - 34.46 -   Foot/eye exam completion dates Latest Ref Rng & Units 07/14/2016 05/29/2015  Eye Exam No Retinopathy - -  Foot exam Order - - -  Foot Form Completion - Done Done

## 2016-07-17 NOTE — Assessment & Plan Note (Signed)
Annual exam as documented. Counseling done  re healthy lifestyle involving commitment to 150 minutes exercise per week, heart healthy diet, and attaining healthy weight.The importance of adequate sleep also discussed.  Immunization and cancer screening needs are specifically addressed at this visit.  

## 2016-07-17 NOTE — Assessment & Plan Note (Signed)
Pain management is assessed, pt reports that she has chronic generalized pain , however, sister states she is functional and she is notes to be somewhat sedated in visit, no indication to increase dose of ,medicationbased on her age and degree of mobility. Registry is reviewed Twelve weeks of medication is prescribed

## 2016-07-17 NOTE — Assessment & Plan Note (Signed)
Affecting skin under both breasts, topical nystatin and clotrimazole/betamethasone twice daily prescribed Educated re need to keep area dry

## 2016-07-19 ENCOUNTER — Telehealth: Payer: Self-pay | Admitting: Family Medicine

## 2016-07-19 NOTE — Telephone Encounter (Signed)
Alicia from [facility] [employer] is calling to request an order for back brace/ knee brace x2 for patient Cb#: [phone]

## 2016-07-19 NOTE — Telephone Encounter (Signed)
We do not do knee braces.

## 2016-07-30 ENCOUNTER — Telehealth: Payer: Self-pay

## 2016-07-30 ENCOUNTER — Other Ambulatory Visit: Payer: Self-pay

## 2016-07-30 DIAGNOSIS — E119 Type 2 diabetes mellitus without complications: Secondary | ICD-10-CM

## 2016-07-30 DIAGNOSIS — I10 Essential (primary) hypertension: Secondary | ICD-10-CM

## 2016-07-30 MED ORDER — LINAGLIPTIN 5 MG PO TABS: 5.0000 mg | ORAL_TABLET | Freq: Every day | ORAL | 4 refills | Status: DC

## 2016-07-30 MED ORDER — ERGOCALCIFEROL 1.25 MG (50000 UT) PO CAPS: 50000.0000 [IU] | ORAL_CAPSULE | ORAL | 1 refills | Status: DC

## 2016-07-30 NOTE — Telephone Encounter (Signed)
-----  Message from [name], MD sent at 07/16/2016  7:11 AM EDT ----- Your blood sugar is too high, you need to reduce sweets and starchy foods and no sweet drinks,only water, also eat smaller portions of food Continue metformin, new to take as well for your blood sugar is once daily tradjenta 5 mg  You need repeat non fasting HBA1C , chem 7 and EGFR in 3 months Your vitamin D level is low, I will prescribe once weekly vit D for 12 weeks then after this you need to take OTC daily vit D3 2000 IU

## 2016-08-04 ENCOUNTER — Other Ambulatory Visit: Payer: Self-pay | Admitting: Family Medicine

## 2016-08-13 ENCOUNTER — Emergency Department (HOSPITAL_COMMUNITY): Payer: PPO

## 2016-08-13 ENCOUNTER — Encounter (HOSPITAL_COMMUNITY): Payer: Self-pay | Admitting: Emergency Medicine

## 2016-08-13 ENCOUNTER — Observation Stay (HOSPITAL_COMMUNITY)
Admission: EM | Admit: 2016-08-13 | Discharge: 2016-08-15 | Disposition: A | Discharge status: Home / Self Care | Payer: PPO | Attending: Internal Medicine | Admitting: Internal Medicine

## 2016-08-13 DIAGNOSIS — Z79899 Other long term (current) drug therapy: Secondary | ICD-10-CM | POA: Diagnosis not present

## 2016-08-13 DIAGNOSIS — E1149 Type 2 diabetes mellitus with other diabetic neurological complication: Secondary | ICD-10-CM

## 2016-08-13 DIAGNOSIS — R079 Chest pain, unspecified: Secondary | ICD-10-CM | POA: Diagnosis not present

## 2016-08-13 DIAGNOSIS — R0789 Other chest pain: Secondary | ICD-10-CM | POA: Diagnosis not present

## 2016-08-13 DIAGNOSIS — Z7984 Long term (current) use of oral hypoglycemic drugs: Secondary | ICD-10-CM | POA: Insufficient documentation

## 2016-08-13 DIAGNOSIS — I10 Essential (primary) hypertension: Secondary | ICD-10-CM | POA: Diagnosis present

## 2016-08-13 DIAGNOSIS — K219 Gastro-esophageal reflux disease without esophagitis: Secondary | ICD-10-CM

## 2016-08-13 DIAGNOSIS — R9431 Abnormal electrocardiogram [ECG] [EKG]: Secondary | ICD-10-CM | POA: Diagnosis present

## 2016-08-13 DIAGNOSIS — Z96652 Presence of left artificial knee joint: Secondary | ICD-10-CM | POA: Insufficient documentation

## 2016-08-13 DIAGNOSIS — E785 Hyperlipidemia, unspecified: Secondary | ICD-10-CM | POA: Diagnosis present

## 2016-08-13 DIAGNOSIS — Z7901 Long term (current) use of anticoagulants: Secondary | ICD-10-CM | POA: Diagnosis not present

## 2016-08-13 DIAGNOSIS — Z794 Long term (current) use of insulin: Secondary | ICD-10-CM

## 2016-08-13 DIAGNOSIS — E1165 Type 2 diabetes mellitus with hyperglycemia: Secondary | ICD-10-CM

## 2016-08-13 DIAGNOSIS — E119 Type 2 diabetes mellitus without complications: Secondary | ICD-10-CM | POA: Insufficient documentation

## 2016-08-13 LAB — BASIC METABOLIC PANEL
ANION GAP: 12 (ref 5–15)
BUN: 15 mg/dL (ref 6–20)
CALCIUM: 9.2 mg/dL (ref 8.9–10.3)
CO2: 25 mmol/L (ref 22–32)
Chloride: 103 mmol/L (ref 101–111)
Creatinine, Ser: 1.16 mg/dL — ABNORMAL HIGH (ref 0.44–1.00)
GFR, EST AFRICAN AMERICAN: 52 mL/min — AB (ref >60)
GFR, EST NON AFRICAN AMERICAN: 45 mL/min — AB (ref >60)
GLUCOSE: 114 mg/dL — AB (ref 65–99)
POTASSIUM: 3.3 mmol/L — AB (ref 3.5–5.1)
SODIUM: 140 mmol/L (ref 135–145)

## 2016-08-13 LAB — CBC
HEMATOCRIT: 36.7 % (ref 36.0–46.0)
HEMOGLOBIN: 11.8 g/dL — AB (ref 12.0–15.0)
MCH: 28.2 pg (ref 26.0–34.0)
MCHC: 32.2 g/dL (ref 30.0–36.0)
MCV: 87.6 fL (ref 78.0–100.0)
Platelets: 340 10*3/uL (ref 150–400)
RBC: 4.19 MIL/uL (ref 3.87–5.11)
RDW: 15.3 % (ref 11.5–15.5)
WBC: 10.3 10*3/uL (ref 4.0–10.5)

## 2016-08-13 LAB — I-STAT TROPONIN, ED: Troponin i, poc: 0 ng/mL (ref 0.00–0.08)

## 2016-08-13 MED ORDER — GI COCKTAIL ~~LOC~~
30.0000 mL | Freq: Once | ORAL | Status: AC
  Administered 2016-08-13: 30 mL via ORAL
  Filled 2016-08-13: qty 30

## 2016-08-13 MED ORDER — ASPIRIN 325 MG PO TABS
325.0000 mg | ORAL_TABLET | Freq: Once | ORAL | Status: AC
  Administered 2016-08-13: 325 mg via ORAL
  Filled 2016-08-13: qty 1

## 2016-08-13 NOTE — ED Provider Notes (Signed)
[facility] DEPT Provider Note   CSN: [id number] Arrival date & time: 08/13/16  2133     History   Chief Complaint Chief Complaint  Patient presents with  . Chest Pain    HPI [name] is a 76 y.o. female.  The history is provided by the patient.  Chest Pain   This is a new problem. The current episode started 2 days ago. The problem occurs constantly. The problem has not changed since onset.The pain is associated with walking, eating and rest. The pain is present in the lateral region. The pain is moderate. The quality of the pain is described as burning (aching). The pain does not radiate. Associated symptoms include nausea. Pertinent negatives include no abdominal pain, no cough, no fever, no leg pain, no lower extremity edema, no near-syncope, no shortness of breath, no vomiting and no weakness. Treatments tried: narcotics. The treatment provided moderate relief.   76 year old female who presents with chest pain. She has a history of her paroxysmal atrial fibrillation on pradaxa, diabetes, hypertension, hyperlipidemia, GERD and obesity. Endorses 2 days of intermittent left-sided chest pain. States pain as aching and burning in nature. Takes her home narcotic medications, with brief relief in symptoms. Associated with mild nausea and shortness of breath. No diaphoresis, vomiting, abdominal pain, diarrhea, syncope or near syncope. No lower extremity edema or calf tenderness. The pain doesn't seem to be worse after meals, but also worse with ambulation and activity.   Past Medical History:  Diagnosis Date  . Anemia, iron deficiency 2012   Evaluated by Dr. Oneida Alar; H&H of 9.3/30.8 with [place] in 10/2010; 3/3 positive Hemoccult cards in 07/2011  . Anxiety    was on Prozac for a couple of weeks  . Atrial fibrillation ([place])    takes Coumadin daily  . Bruises easily    takes Coumadin  . Chronic anticoagulation 07/21/2010  . Chronic back pain    related to knee pain  .  Degenerative joint disease    Knees  . Depression   . Diabetes mellitus    takes Metformin daily  . Gastroesophageal reflux disease    takes Omeprazole daily  . Glaucoma   . Glaucoma    uses eye drops at night  . History of blood transfusion 1969  . History of gout    was on medication but taken off of several months ago  . Hx of colonic polyps   . Hx of migraines    last one about a yr ago;takes Topamax daily  . Hyperlipidemia    takes Pravastatin daily  . Hypertension    takes Hyzaar daily and Propranolol   . Insomnia    takes Restoril prn  . Joint pain   . Joint swelling   . Memory loss    takes donepezil  . Migraine   . Migraine   . Nocturia   . Overweight(278.02)   . Pneumonia 1969   hx of  . Pulmonary embolism [facility]) May 2012   acute presentation, bilateral PE  . Skin spots-aging   . Urinary frequency     Patient Active Problem List   Diagnosis Date Noted  . 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 Annual physical exam 07/17/2016  . Chronic pain syndrome 02/03/2016  . Bilateral knee pain 04/23/2014  . Type 2 diabetes mellitus with hemoglobin A1c goal of less than 8.0% ([place]) 11/15/2013  . Right-sided low back pain with right-sided sciatica 07/02/2013  . Seasonal allergies 04/11/2013  . Urinary incontinence 04/11/2013  .  Metabolic syndrome X 63/78/5885  . Chronic anticoagulation 12/22/2012  . Vitamin D deficiency 11/05/2012  . Memory loss 11/01/2012  . Depression with anxiety 11/01/2012  . Anemia, iron deficiency   . Morbid obesity ([place]) 08/16/2008  . Hyperlipemia 05/30/2007  . Migraine 05/30/2007  . HTN (hypertension) 05/30/2007  . Gastroesophageal reflux disease 05/30/2007    Past Surgical History:  Procedure Laterality Date  . CATARACT EXTRACTION W/PHACO Right 04/11/2012   Procedure: CATARACT EXTRACTION PHACO AND INTRAOCULAR LENS PLACEMENT (IOC);  Surgeon: Elta Guadeloupe T. Gershon Crane, MD;  Location: [facility] ORS;  Service: Ophthalmology;  Laterality: Right;  CDE=9.61  .  CATARACT EXTRACTION W/PHACO Left 12/26/2012   Procedure: CATARACT EXTRACTION PHACO AND INTRAOCULAR LENS PLACEMENT (IOC);  Surgeon: Elta Guadeloupe T. Gershon Crane, MD;  Location: [facility] OR[facility];  Service: Ophthalmology;  Laterality: Left;  CDE:7.74  . COLONOSCOPY  Jan 2002; 2012   2002: Dr. Tamala Julian, ext. hemorrhoids, nl colon; 2012-adenomatous polyps, gastritis on EGD  . DILATION AND CURETTAGE OF UTERUS    . ESOPHAGOGASTRODUODENOSCOPY  08/24/2011   ESOPHAGOGASTRODUODENOSCOPY; esophageal dilatation; Rogene Houston, MD;  . Freda Munro CAPSULE STUDY  08/18/2011   Procedure: GIVENS CAPSULE STUDY;  Surgeon: Rogene Houston, MD;  Location: [facility] END[facility] SUITE;  Service: Endoscopy;  Laterality: N/A;  730  . KNEE ARTHROSCOPY W/ MENISCECTOMY  90's   Left  . ORIF ANKLE FRACTURE Right 90's  . TONSILLECTOMY    . TOTAL KNEE ARTHROPLASTY  10/20/2011   Procedure: TOTAL KNEE ARTHROPLASTY;  Surgeon: Ninetta Lights, MD;  Location: [place];  Service: Orthopedics;  Laterality: Left;  . UPPER GASTROINTESTINAL ENDOSCOPY    . URETHRAL DILATION      OB History    No data available       Home Medications    Prior to Admission medications   Medication Sig Start Date End Date Taking? Authorizing Provider  acyclovir (ZOVIRAX) 400 MG tablet TAKE 1 TABLET (400 MG TOTAL) BY MOUTH 3 (THREE) TIMES DAILY. 08/13/16  Yes [name], MD  amLODipine (NORVASC) 10 MG tablet TAKE 1 TABLET EVERY DAY 08/08/15  Yes [name], MD  atorvastatin (LIPITOR) 40 MG tablet TAKE 1 TABLET (40 MG TOTAL) BY MOUTH DAILY. 05/06/16  Yes [name], MD  busPIRone (BUSPAR) 7.5 MG tablet Take 7.5 mg by mouth 3 (three) times daily.   Yes [provider]  cloNIDine (CATAPRES) 0.1 MG tablet TAKE 1 TABLET BY MOUTH EVERY DAY 02/17/16  Yes [name], MD  cyclobenzaprine (FLEXERIL) 10 MG tablet Obne tablet twice daily Patient taking differently: Take 10 mg by mouth 2 (two) times daily.  04/12/16  Yes [name], MD  donepezil (ARICEPT) 10 MG  tablet TAKE 1 TABLET AT BEDTIME 08/08/15  Yes [name], MD  ergocalciferol (VITAMIN D2) 50000 units capsule Take 1 capsule (50,000 Units total) by mouth once a week. One capsule once weekly 07/30/16  Yes [name], MD  gabapentin (NEURONTIN) 300 MG capsule Take 300 mg by mouth at bedtime.   Yes [provider]  hydrOXYzine (ATARAX/VISTARIL) 25 MG tablet Take 1 tablet (25 mg total) by mouth 3 (three) times daily as needed. 11/10/15  Yes [name], MD  imipramine (TOFRANIL) 50 MG tablet TAKE 2 TABLETS AT BEDTIME 08/08/15  Yes [name], MD  linagliptin (TRADJENTA) 5 MG TABS tablet Take 1 tablet (5 mg total) by mouth daily. 07/30/16  Yes [name], MD  meclizine (ANTIVERT) 25 MG tablet TAKE 1 TABLET THREE TIMES  DAILY AS NEEDED FOR DIZZINESS. 02/25/15  Yes [name], MD  metFORMIN (GLUCOPHAGE) 500 MG tablet TAKE 1 TABLET TWICE DAILY WITH MEALS 08/08/15  Yes [name], MD  montelukast (SINGULAIR) 10 MG tablet TAKE 1 TABLET (10 MG TOTAL) BY MOUTH AT BEDTIME. Patient taking differently: TAKE 1 TABLET (10 MG TOTAL) BY MOUTH AT BEDTIME AS NEEDED 05/03/16  Yes [name], MD  omeprazole (PRILOSEC) 40 MG capsule TAKE 1 CAPSULE EVERY DAY 08/08/15  Yes [name], MD  ondansetron (ZOFRAN) 4 MG tablet Take 4 mg by mouth 2 (two) times daily as needed for nausea or vomiting.   Yes [provider]  oxyCODONE-acetaminophen (PERCOCET) 10-325 MG tablet Take 1 tablet by mouth 2 (two) times daily. 08/14/16  Yes [name], MD  PRADAXA 150 MG CAPS capsule TAKE 1 CAPSULE EVERY 12 HOURS 08/08/15  Yes Arnoldo Lenis, MD  propranolol (INDERAL) 80 MG tablet TAKE 1 TABLET BY MOUTH EVERY DAY 07/06/16  Yes [name], MD  SUMAtriptan (IMITREX) 5 MG/ACT nasal spray Place 1 spray (5 mg total) into the nose every 2 (two) hours as needed for migraine. One spray into each nostril , may repeat once after 2 hours. No more  than 4 sprays in a 24 hour period. No more than twice weekly 10/29/15  Yes [name], MD  temazepam (RESTORIL) 30 MG capsule Take 1 capsule (30 mg total) by mouth at bedtime as needed for sleep. 04/12/16  Yes [name], MD  topiramate (TOPAMAX) 100 MG tablet TAKE 1 TABLET (100 MG TOTAL) BY MOUTH 2 (TWO) TIMES DAILY. 05/31/16  Yes [name], MD  VENTOLIN HFA 108 [phone] Base) MCG/ACT inhaler INHALE 2 PUFFS INTO THE LUNGS EVERY 6 (SIX) HOURS AS NEEDED FOR WHEEZING OR SHORTNESS OF BREATH. 05/26/16  Yes [name], MD  ACCU-CHEK FASTCLIX LANCETS MISC Once daily testing dx e11.9 11/24/15   [name], MD  Blood Glucose Monitoring Suppl (ACCU-CHEK AVIVA PLUS) w/Device KIT For once daily testing dx E11.9 11/24/15   [name], MD  chlorpheniramine-HYDROcodone (TUSSIONEX PENNKINETIC ER) 10-8 MG/5ML SUER Take 5 mLs by mouth at bedtime as needed for cough. Patient not taking: Reported on 07/14/2016 05/21/16   [name], MD  clotrimazole-betamethasone (LOTRISONE) cream Apply cream twice daily to rash under breasts for 10 days , then as needed Patient not taking: Reported on 08/13/2016 07/14/16   [name], MD  glucose blood (ACCU-CHEK AVIVA) test strip Use as instructed one daily testing dx e11.9 11/24/15   [name], MD  nystatin (MYCOSTATIN/NYSTOP) powder Apply topically 2 (two) times daily. Apply twice daily to rash under breast for 10 days, then as needed Patient not taking: Reported on 08/13/2016 07/14/16   [name], MD  oxyCODONE-acetaminophen (PERCOCET) 10-325 MG tablet Take 1 tablet by mouth 2 (two) times daily. One tablet twice daily for pain Patient not taking: Reported on 08/13/2016 07/14/16   [name], MD  oxyCODONE-acetaminophen (PERCOCET) 10-325 MG tablet Take 1 tablet by mouth every 8 (eight) hours as needed for pain. 09/13/16   [name], MD  tiZANidine (ZANAFLEX) 2 MG tablet TAKE 1 TABLET (2 MG  TOTAL) BY MOUTH AT BEDTIME. Patient not taking: Reported on 08/13/2016 04/08/16   [name], MD    Family History Family History  Problem Relation Age of Onset  . Diabetes Mother   . Hypertension Mother   . Arthritis Mother   .  Heart failure Mother   . Migraines Mother   . Kidney failure Mother   . Leukemia Father   . Migraines Father   . Kidney failure Father   . Diabetes Sister   . Hypertension Sister   . Diabetes Brother   . Hypertension Brother   . Hypertension Brother   . Hypertension Brother   . Hypertension Brother   . Diabetes Brother   . Diabetes Brother   . Diabetes Brother   . Pulmonary embolism Sister   . Migraines Sister   . Kidney failure Brother   . Colon cancer Neg Hx   . Colon polyps Neg Hx     Social History Social History  Substance Use Topics  . Smoking status: Never Smoker  . Smokeless tobacco: Never Used  . Alcohol use No     Allergies   Penicillins; Fentanyl; Sulfonamide derivatives; and Codeine   Review of Systems Review of Systems  Constitutional: Negative for fever.  Respiratory: Negative for cough and shortness of breath.   Cardiovascular: Positive for chest pain. Negative for near-syncope.  Gastrointestinal: Positive for nausea. Negative for abdominal pain and vomiting.  Neurological: Negative for weakness.  All other systems reviewed and are negative.    Physical Exam Updated Vital Signs BP (!) 152/87   Pulse 95   Temp 98.2 F (36.8 C) (Oral)   Resp 18   Ht 5' 7"  (1.702 m)   Wt 99.8 kg (220 lb)   SpO2 95%   BMI 34.46 kg/m   Physical Exam Physical Exam  Nursing note and vitals reviewed. Constitutional: Well developed, well nourished, non-toxic, and in no acute distress Head: Normocephalic and atraumatic.  Mouth/Throat: Oropharynx is clear and moist.  Neck: Normal range of motion. Neck supple.  Cardiovascular: Normal rate and regular rhythm.   Pulmonary/Chest: Effort normal and breath sounds normal.    Abdominal: Soft. There is no tenderness. There is no rebound and no guarding.  Musculoskeletal: Normal range of motion.  Neurological: Alert, no facial droop, fluent speech, moves all extremities symmetrically Skin: Skin is warm and dry.  Psychiatric: Cooperative   ED Treatments / Results  Labs (all labs ordered are listed, but only abnormal results are displayed) Labs Reviewed  BASIC METABOLIC PANEL - Abnormal; Notable for the following:       Result Value   Potassium 3.3 (*)    Glucose, Bld 114 (*)    Creatinine, Ser 1.16 (*)    GFR calc non Af Amer 45 (*)    GFR calc Af Amer 52 (*)    All other components within normal limits  CBC - Abnormal; Notable for the following:    Hemoglobin 11.8 (*)    All other components within normal limits  I-STAT TROPOININ, ED    EKG  EKG Interpretation  Date/Time:  Friday August 13 2016 22:03:27 EDT Ventricular Rate:  96 PR Interval:    QRS Duration: 75 QT Interval:  423 QTC Calculation: 535 R Axis:   10 Text Interpretation:  Sinus rhythm Abnormal R-wave progression, early transition LVH by voltage Borderline T abnormalities, anterior leads Prolonged QT interval no acute changes  Confirmed by Brantley Stage [phone]) on 08/13/2016 10:22:22 PM       Radiology Dg Chest 2 View  Result Date: 08/13/2016 CLINICAL DATA:  76 year old female with chest pain and shortness of breath for 2 days. EXAM: CHEST  2 VIEW COMPARISON:  11/20/2014 and prior radiographs FINDINGS: The cardiomediastinal silhouette is unremarkable. There is no evidence of focal  airspace disease, pulmonary edema, suspicious pulmonary nodule/mass, pleural effusion, or pneumothorax. No acute bony abnormalities are identified. IMPRESSION: No active cardiopulmonary disease. Electronically Signed   By: Margarette Canada M.D.   On: 08/13/2016 22:52    Procedures Procedures (including critical care time)  Medications Ordered in ED Medications  gi cocktail (Maalox,Lidocaine,Donnatal) (30 mLs  Oral Given 08/13/16 2321)  aspirin tablet 325 mg (325 mg Oral Given 08/13/16 2321)     Initial Impression / Assessment and Plan / ED Course  I have reviewed the triage vital signs and the nursing notes.  Pertinent labs & imaging results that were available during my care of the patient were reviewed by me and considered in my medical decision making (see chart for details).     76 year old female who presents with intermittent left-sided chest pain. She is nontoxic in no acute distress. On presentation is well appearing. EKG is not acutely ischemic. Troponin x 1 is normal. Chest x-ray visualized shows no acute cardiopulmonary processes. Symptoms could be potentially GI/GERD related. However she also says it is worse with activity, and does have many ACS risk factors. Heart score of 5. Given GI cocktail and aspirin. Will admit for observation for ACS rule out.  Final Clinical Impressions(s) / ED Diagnoses   Final diagnoses:  Nonspecific chest pain    New Prescriptions New Prescriptions   No medications on file     Forde Dandy, MD 08/13/16 2337

## 2016-08-13 NOTE — ED Triage Notes (Signed)
Pt states she has been having a burning type chest pain for 2 days.  Pt states she thought it was indigestion but pain is not getting better

## 2016-08-13 NOTE — ED Notes (Signed)
Pt requesting to take home pain medication for arthritis pain.  Per verbal order Dr. Oleta Mouse, pt allowed to take home pain medication.  Pt took Oxycodone-Acetaminophen 10mg /325mg 

## 2016-08-13 NOTE — Telephone Encounter (Signed)
Seen 5 30 18 

## 2016-08-14 ENCOUNTER — Encounter (HOSPITAL_COMMUNITY): Payer: Self-pay

## 2016-08-14 DIAGNOSIS — I10 Essential (primary) hypertension: Secondary | ICD-10-CM

## 2016-08-14 DIAGNOSIS — R079 Chest pain, unspecified: Secondary | ICD-10-CM | POA: Diagnosis not present

## 2016-08-14 DIAGNOSIS — Z7901 Long term (current) use of anticoagulants: Secondary | ICD-10-CM | POA: Diagnosis not present

## 2016-08-14 DIAGNOSIS — R0789 Other chest pain: Principal | ICD-10-CM

## 2016-08-14 DIAGNOSIS — E119 Type 2 diabetes mellitus without complications: Secondary | ICD-10-CM

## 2016-08-14 DIAGNOSIS — R9431 Abnormal electrocardiogram [ECG] [EKG]: Secondary | ICD-10-CM | POA: Diagnosis present

## 2016-08-14 LAB — COMPREHENSIVE METABOLIC PANEL
ALK PHOS: 87 U/L (ref 38–126)
ALT: 17 U/L (ref 14–54)
AST: 22 U/L (ref 15–41)
Albumin: 3.5 g/dL (ref 3.5–5.0)
Anion gap: 12 (ref 5–15)
BUN: 15 mg/dL (ref 6–20)
CALCIUM: 8.8 mg/dL — AB (ref 8.9–10.3)
CHLORIDE: 104 mmol/L (ref 101–111)
CO2: 24 mmol/L (ref 22–32)
CREATININE: 1.1 mg/dL — AB (ref 0.44–1.00)
GFR, EST AFRICAN AMERICAN: 55 mL/min — AB (ref >60)
GFR, EST NON AFRICAN AMERICAN: 48 mL/min — AB (ref >60)
Glucose, Bld: 130 mg/dL — ABNORMAL HIGH (ref 65–99)
Potassium: 3.5 mmol/L (ref 3.5–5.1)
Sodium: 140 mmol/L (ref 135–145)
Total Bilirubin: 0.2 mg/dL — ABNORMAL LOW (ref 0.3–1.2)
Total Protein: 6.8 g/dL (ref 6.5–8.1)

## 2016-08-14 LAB — GLUCOSE, CAPILLARY
GLUCOSE-CAPILLARY: 150 mg/dL — AB (ref 65–99)
GLUCOSE-CAPILLARY: 128 mg/dL — AB (ref 65–99)
Glucose-Capillary: 121 mg/dL — ABNORMAL HIGH (ref 65–99)
Glucose-Capillary: 144 mg/dL — ABNORMAL HIGH (ref 65–99)

## 2016-08-14 LAB — TROPONIN I
Troponin I: <0.03 ng/mL (ref <0.03)
Troponin I: <0.03 ng/mL (ref <0.03)
Troponin I: <0.03 ng/mL (ref <0.03)

## 2016-08-14 LAB — TSH: TSH: 0.981 u[IU]/mL (ref 0.350–4.500)

## 2016-08-14 LAB — MAGNESIUM: MAGNESIUM: 1.7 mg/dL (ref 1.7–2.4)

## 2016-08-14 MED ORDER — BUSPIRONE HCL 5 MG PO TABS
7.5000 mg | ORAL_TABLET | Freq: Three times a day (TID) | ORAL | Status: DC
  Administered 2016-08-14 – 2016-08-15 (×4): 7.5 mg via ORAL
  Filled 2016-08-14 (×4): qty 2

## 2016-08-14 MED ORDER — HYDROXYZINE HCL 25 MG PO TABS: 25.0000 mg | ORAL_TABLET | Freq: Three times a day (TID) | ORAL | Status: DC | PRN

## 2016-08-14 MED ORDER — ATORVASTATIN CALCIUM 40 MG PO TABS
40.0000 mg | ORAL_TABLET | Freq: Every day | ORAL | Status: DC
  Administered 2016-08-14 – 2016-08-15 (×2): 40 mg via ORAL
  Filled 2016-08-14 (×2): qty 1

## 2016-08-14 MED ORDER — ALUM & MAG HYDROXIDE-SIMETH 200-200-20 MG/5ML PO SUSP
15.0000 mL | Freq: Four times a day (QID) | ORAL | Status: DC | PRN
  Administered 2016-08-14: 15 mL via ORAL
  Filled 2016-08-14: qty 30

## 2016-08-14 MED ORDER — DONEPEZIL HCL 5 MG PO TABS
10.0000 mg | ORAL_TABLET | Freq: Every day | ORAL | Status: DC
  Administered 2016-08-14 (×2): 10 mg via ORAL
  Filled 2016-08-14 (×2): qty 2

## 2016-08-14 MED ORDER — GABAPENTIN 300 MG PO CAPS
300.0000 mg | ORAL_CAPSULE | Freq: Every day | ORAL | Status: DC
  Administered 2016-08-14 (×2): 300 mg via ORAL
  Filled 2016-08-14 (×2): qty 1

## 2016-08-14 MED ORDER — CLONIDINE HCL 0.1 MG PO TABS
0.1000 mg | ORAL_TABLET | Freq: Every day | ORAL | Status: DC
  Administered 2016-08-14 – 2016-08-15 (×2): 0.1 mg via ORAL
  Filled 2016-08-14 (×2): qty 1

## 2016-08-14 MED ORDER — MAGNESIUM OXIDE 400 (241.3 MG) MG PO TABS: 200.0000 mg | ORAL_TABLET | Freq: Two times a day (BID) | ORAL | Status: DC

## 2016-08-14 MED ORDER — ACETAMINOPHEN 325 MG PO TABS
650.0000 mg | ORAL_TABLET | ORAL | Status: DC | PRN
  Administered 2016-08-14: 650 mg via ORAL
  Filled 2016-08-14: qty 2

## 2016-08-14 MED ORDER — TEMAZEPAM 15 MG PO CAPS
30.0000 mg | ORAL_CAPSULE | Freq: Every evening | ORAL | Status: DC | PRN
  Administered 2016-08-14: 30 mg via ORAL
  Filled 2016-08-14: qty 2

## 2016-08-14 MED ORDER — INSULIN ASPART 100 UNIT/ML ~~LOC~~ SOLN: 0.0000 [IU] | Freq: Every day | SUBCUTANEOUS | Status: DC

## 2016-08-14 MED ORDER — HYDROXYZINE HCL 25 MG PO TABS: 12.5000 mg | ORAL_TABLET | Freq: Three times a day (TID) | ORAL | Status: DC | PRN

## 2016-08-14 MED ORDER — PROPRANOLOL HCL 20 MG PO TABS
80.0000 mg | ORAL_TABLET | Freq: Every day | ORAL | Status: DC
  Administered 2016-08-14 – 2016-08-15 (×2): 80 mg via ORAL
  Filled 2016-08-14 (×2): qty 4

## 2016-08-14 MED ORDER — IMIPRAMINE HCL 25 MG PO TABS
100.0000 mg | ORAL_TABLET | Freq: Every day | ORAL | Status: DC
  Administered 2016-08-14 (×2): 100 mg via ORAL
  Filled 2016-08-14 (×2): qty 4

## 2016-08-14 MED ORDER — INSULIN ASPART 100 UNIT/ML ~~LOC~~ SOLN
0.0000 [IU] | Freq: Three times a day (TID) | SUBCUTANEOUS | Status: DC
  Administered 2016-08-14 (×2): 1 [IU] via SUBCUTANEOUS

## 2016-08-14 MED ORDER — OXYCODONE-ACETAMINOPHEN 5-325 MG PO TABS
2.0000 | ORAL_TABLET | Freq: Four times a day (QID) | ORAL | Status: DC | PRN
  Administered 2016-08-14 (×2): 2 via ORAL
  Filled 2016-08-14 (×2): qty 2

## 2016-08-14 MED ORDER — POTASSIUM CHLORIDE CRYS ER 20 MEQ PO TBCR
40.0000 meq | EXTENDED_RELEASE_TABLET | Freq: Once | ORAL | Status: AC
  Administered 2016-08-14: 40 meq via ORAL
  Filled 2016-08-14: qty 2

## 2016-08-14 MED ORDER — MAGNESIUM OXIDE 400 (241.3 MG) MG PO TABS
400.0000 mg | ORAL_TABLET | Freq: Every day | ORAL | Status: DC
  Administered 2016-08-14 – 2016-08-15 (×2): 400 mg via ORAL
  Filled 2016-08-14 (×3): qty 1

## 2016-08-14 MED ORDER — POTASSIUM CHLORIDE CRYS ER 20 MEQ PO TBCR
20.0000 meq | EXTENDED_RELEASE_TABLET | Freq: Every day | ORAL | Status: DC
  Administered 2016-08-14 – 2016-08-15 (×2): 20 meq via ORAL
  Filled 2016-08-14 (×2): qty 1

## 2016-08-14 MED ORDER — ASPIRIN 81 MG PO CHEW
81.0000 mg | CHEWABLE_TABLET | Freq: Every day | ORAL | Status: DC
  Administered 2016-08-14 – 2016-08-15 (×2): 81 mg via ORAL
  Filled 2016-08-14 (×2): qty 1

## 2016-08-14 MED ORDER — TOPIRAMATE 100 MG PO TABS
100.0000 mg | ORAL_TABLET | Freq: Two times a day (BID) | ORAL | Status: DC
  Administered 2016-08-14 – 2016-08-15 (×3): 100 mg via ORAL
  Filled 2016-08-14 (×3): qty 1

## 2016-08-14 MED ORDER — AMLODIPINE BESYLATE 5 MG PO TABS
10.0000 mg | ORAL_TABLET | Freq: Every day | ORAL | Status: DC
  Administered 2016-08-14 – 2016-08-15 (×2): 10 mg via ORAL
  Filled 2016-08-14 (×2): qty 2

## 2016-08-14 MED ORDER — DABIGATRAN ETEXILATE MESYLATE 150 MG PO CAPS
150.0000 mg | ORAL_CAPSULE | Freq: Two times a day (BID) | ORAL | Status: DC
  Administered 2016-08-14 – 2016-08-15 (×4): 150 mg via ORAL
  Filled 2016-08-14 (×4): qty 1

## 2016-08-14 MED ORDER — ONDANSETRON HCL 4 MG/2ML IJ SOLN: 4.0000 mg | Freq: Four times a day (QID) | INTRAMUSCULAR | Status: DC | PRN

## 2016-08-14 MED ORDER — INSULIN ASPART 100 UNIT/ML ~~LOC~~ SOLN
0.0000 [IU] | Freq: Three times a day (TID) | SUBCUTANEOUS | Status: DC
  Administered 2016-08-14: 2 [IU] via SUBCUTANEOUS
  Administered 2016-08-15: 3 [IU] via SUBCUTANEOUS
  Administered 2016-08-15: 2 [IU] via SUBCUTANEOUS

## 2016-08-14 MED ORDER — PANTOPRAZOLE SODIUM 40 MG PO TBEC
40.0000 mg | DELAYED_RELEASE_TABLET | Freq: Every day | ORAL | Status: DC
  Administered 2016-08-14 – 2016-08-15 (×2): 40 mg via ORAL
  Filled 2016-08-14 (×2): qty 1

## 2016-08-14 NOTE — Progress Notes (Signed)
PROGRESS NOTE    [name]  EZM:[id number] DOB: 12-25-1940 DOA: 08/13/2016 PCP: [name], MD    Brief Narrative:  Patient is a 76 year old woman with a history of PAF on Pradaxa, DM, HTN, GERD, and hyperlipidemia, who presented to the ED on 08/13/16 with a chief complaint of chest pain. In the ED, she was afebrile and hemodynamically stable, but moderately hypertensive with a systolic blood pressure in the 150s to 170s. Her point-of-care troponin I was 0. Her potassium was 3.3, creatinine 1.16, normal magnesium of 1.7, and her chest x-ray revealed no active cardiopulmonary disease. Her EKG reveals sinus rhythm with a heart rate of 94 bpm and nonspecific T-wave abnormalities. She was admitted just after midnight for further evaluation and management.   Assessment & Plan:   Principal Problem:   Atypical chest pain Active Problems:   HTN (hypertension)   Prolonged Q-T interval on ECG   Hyperlipemia   Morbid obesity (HCC)   Gastroesophageal reflux disease   Chronic anticoagulation   Type 2 diabetes mellitus with hemoglobin A1c goal of less than 8.0% (HCC)   1. Atypical chest pain. The patient was given aspirin and a GI cocktail in the ED with relief of her symptoms. -Troponin I is then cycled and is negative 2. Continue to cycle and monitor. -Continue oxycodone as needed for pain.  GERD. Patient has a history of GERD and is prescribed omeprazole at home. She believes that she has been taking it. -Protonix started on admission; will continue. -We'll add Mylanta when necessary.  Prolonged QTC. Patient was noted to have a prolonged QTc interval on the EKG. She is on 4 medications that can cause it including hydroxyzine, Protonix, imipramine, and Zofran. -We'll decrease the dose of imipramine and hydroxyzine. -We'll start oral magnesium oxide for borderline hypomagnesemia. -We'll continue to monitor.  Essential hypertension. The patient is treated chronically with  propanolol, clonidine and amlodipine. They were continued. Her blood pressure is controlled.  Paroxysmal atrial fibrillation. The patient is chronically anticoagulated with Pradaxa and rate controlled with propanolol. -Her heart rate has been well within normal limits.  Hypokalemia. Patient's potassium was 3.3 on admission. She was given potassium chloride with improvement to 3.5. -We will continue potassium chloride supplementation.  Type 2 diabetes mellitus. The patient is treated chronically with Trajenta and metformin. Both were withheld on admission. -SS I started. -Her CBGs have been reasonable, so will hold home medications and continue SSI  Hyperlipidemia. Patient was restarted on Lipitor. -We'll order a fasting lipid panel.  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 -Patient is treated chronically with BuSpar and Aricept. -Currently stable.  Chronic pain syndrome. The patient is treated chronically with oxycodone/acetaminophen, Zanaflex as needed. -Currently stable.  DVT prophylaxis:Pradaxa Code Status: Full code Family Communication: Discussed with daughter Disposition Plan: Discharged home in the next 24 hours.   Consultants:   None  Procedures:   None  Antimicrobials:  None   Subjective: Patient says that the chest pain has subsided but has not completely gone away. She denies radiation of the pain which is located substernally and on the right. Her nausea has resolved. She denies shortness of breath at rest. She acknowledges some history of GERD.  Objective: Vitals:   08/14/16 0000 08/14/16 0051 08/14/16 0350 08/14/16 0918  BP: (!) 171/97 (!) 190/93 (!) 185/85 124/66  Pulse:  95 95 86  Resp: 20 18 20 20   Temp:  98.3 F (36.8 C) 98.1 F (36.7 C)   TempSrc:  Oral Oral  SpO2:  97% 98% 96%  Weight:  100.3 kg (221 lb 3.2 oz)    Height:  5\' 7"  (1.702 m)      Intake/Output Summary (Last 24 hours) at 08/14/16 1418 Last data filed at  08/14/16 0505  Gross per 24 hour  Intake                0 ml  Output              400 ml  Net             -400 ml   Filed Weights   08/13/16 2145 08/13/16 2150 08/14/16 0051  Weight: 99.8 kg (220 lb) 99.8 kg (220 lb) 100.3 kg (221 lb 3.2 oz)    Examination:  General exam: Appears calm and comfortable  Respiratory system: Clear to auscultation. Respiratory effort normal. Cardiovascular system: S1 & S2 heard with 1 to 2/6 systolic murmur; No JVD, murmurs, rubs, gallops or clicks. No pedal edema. Gastrointestinal system: Abdomen is obese, nondistended, soft and nontender. No organomegaly or masses felt. Normal bowel sounds heard. Central nervous system: Alert and oriented. No focal neurological deficits. Extremities: No acute hot red joints. Skin: No rashes, lesions or ulcers Psychiatry:  Mood & affect appropriate.     Data Reviewed: I have personally reviewed following labs and imaging studies  CBC:  Recent Labs Lab 08/13/16 2156  WBC 10.3  HGB 11.8*  HCT 36.7  MCV 87.6  PLT 109   Basic Metabolic Panel:  Recent Labs Lab 08/13/16 2156 08/14/16 0440  NA 140 140  K 3.3* 3.5  CL 103 104  CO2 25 24  GLUCOSE 114* 130*  BUN 15 15  CREATININE 1.16* 1.10*  CALCIUM 9.2 8.8*  MG 1.7  --    GFR: Estimated Creatinine Clearance: 53 mL/min (A) (by C-G formula based on SCr of 1.1 mg/dL (H)). Liver Function Tests:  Recent Labs Lab 08/14/16 0440  AST 22  ALT 17  ALKPHOS 87  BILITOT 0.2*  PROT 6.8  ALBUMIN 3.5   No results for input(s): LIPASE, AMYLASE in the last 168 hours. No results for input(s): AMMONIA in the last 168 hours. Coagulation Profile: No results for input(s): INR, PROTIME in the last 168 hours. Cardiac Enzymes:  Recent Labs Lab 08/14/16 0440 08/14/16 1049  TROPONINI <0.03 <0.03   BNP (last 3 results) No results for input(s): PROBNP in the last 8760 hours. HbA1C: No results for input(s): HGBA1C in the last 72 hours. CBG:  Recent Labs Lab  08/14/16 0731 08/14/16 1159  GLUCAP 121* 150*   Lipid Profile: No results for input(s): CHOL, HDL, LDLCALC, TRIG, CHOLHDL, LDLDIRECT in the last 72 hours. Thyroid Function Tests: No results for input(s): TSH, T4TOTAL, FREET4, T3FREE, THYROIDAB in the last 72 hours. Anemia Panel: No results for input(s): VITAMINB12, FOLATE, FERRITIN, TIBC, IRON, RETICCTPCT in the last 72 hours. Sepsis Labs: No results for input(s): PROCALCITON, LATICACIDVEN in the last 168 hours.  No results found for this or any previous visit (from the past 240 hour(s)).       Radiology Studies: Dg Chest 2 View  Result Date: 08/13/2016 CLINICAL DATA:  76 year old female with chest pain and shortness of breath for 2 days. EXAM: CHEST  2 VIEW COMPARISON:  11/20/2014 and prior radiographs FINDINGS: The cardiomediastinal silhouette is unremarkable. There is no evidence of focal airspace disease, pulmonary edema, suspicious pulmonary nodule/mass, pleural effusion, or pneumothorax. No acute bony abnormalities are identified. IMPRESSION: No active cardiopulmonary disease. Electronically  Signed   By: Margarette Canada M.D.   On: 08/13/2016 22:52        Scheduled Meds: . amLODipine  10 mg Oral Daily  . atorvastatin  40 mg Oral Daily  . busPIRone  7.5 mg Oral TID  . cloNIDine  0.1 mg Oral Daily  . dabigatran  150 mg Oral Q12H  . donepezil  10 mg Oral QHS  . gabapentin  300 mg Oral QHS  . imipramine  100 mg Oral QHS  . insulin aspart  0-9 Units Subcutaneous TID WC  . magnesium oxide  200 mg Oral BID  . pantoprazole  40 mg Oral Daily  . propranolol  80 mg Oral Daily  . topiramate  100 mg Oral BID   Continuous Infusions:   LOS: 0 days    Time spent: 56 minutes    Rexene Alberts, MD [facility] Pager [phone]  If 7PM-7AM, please contact night-coverage [email or web address] Password [email or web address] 08/14/2016, 2:18 PM

## 2016-08-14 NOTE — H&P (Signed)
TRH H&P    Patient Demographics:    [name], is a 76 y.o. female  MRN: [id number]  DOB - Sep 23, 1940  Admit Date - 08/13/2016  Referring MD/NP/PA: Dr Oleta Mouse  Outpatient Primary MD for the patient is [name], MD  Patient coming from: Home  Chief Complaint  Patient presents with  . Chest Pain      HPI:    [name]  is a 76 y.o. female, With history of paroxysmal atrial fibrillation on Pradaxa, diabetes mellitus, hypertension, hyperlipidemia, GERD, obesity who came to hospital with chief complaint of chest pain which started 2 days ago. Pain becomes worse on walking, located in left chest, does not radiate. Described as burning. Associated with nausea. No vomiting or diarrhea. Denies any cough. No dysuria. No abdominal pain.  In the ED, EKG showed normal sinus rhythm. Cardiac enzymes 1 negative.    Review of systems:      All other systems reviewed and are negative.   With Past History of the following :    Past Medical History:  Diagnosis Date  . Anemia, iron deficiency 2012   Evaluated by Dr. Oneida Alar; H&H of 9.3/30.8 with [place] in 10/2010; 3/3 positive Hemoccult cards in 07/2011  . Anxiety    was on Prozac for a couple of weeks  . Atrial fibrillation ([place])    takes Coumadin daily  . Bruises easily    takes Coumadin  . Chronic anticoagulation 07/21/2010  . Chronic back pain    related to knee pain  . Degenerative joint disease    Knees  . Depression   . Diabetes mellitus    takes Metformin daily  . Gastroesophageal reflux disease    takes Omeprazole daily  . Glaucoma   . Glaucoma    uses eye drops at night  . History of blood transfusion 1969  . History of gout    was on medication but taken off of several months ago  . Hx of colonic polyps   . Hx of migraines    last one about a yr ago;takes Topamax daily  . Hyperlipidemia    takes Pravastatin daily  .  Hypertension    takes Hyzaar daily and Propranolol   . Insomnia    takes Restoril prn  . Joint pain   . Joint swelling   . Memory loss    takes donepezil  . Migraine   . Migraine   . Nocturia   . Overweight(278.02)   . Pneumonia 1969   hx of  . Pulmonary embolism [facility]) May 2012   acute presentation, bilateral PE  . Skin spots-aging   . Urinary frequency       Past Surgical History:  Procedure Laterality Date  . CATARACT EXTRACTION W/PHACO Right 04/11/2012   Procedure: CATARACT EXTRACTION PHACO AND INTRAOCULAR LENS PLACEMENT (IOC);  Surgeon: Elta Guadeloupe T. Gershon Crane, MD;  Location: [facility];  Service: Ophthalmology;  Laterality: Right;  CDE=9.61  . CATARACT EXTRACTION W/PHACO Left 12/26/2012   Procedure: CATARACT EXTRACTION PHACO AND INTRAOCULAR LENS PLACEMENT (IOC);  Surgeon: Elta Guadeloupe T. Gershon Crane, MD;  Location: [facility];  Service: Ophthalmology;  Laterality: Left;  CDE:7.74  . COLONOSCOPY  Jan 2002; 2012   2002: Dr. Tamala Julian, ext. hemorrhoids, nl colon; 2012-adenomatous polyps, gastritis on EGD  . DILATION AND CURETTAGE OF UTERUS    . ESOPHAGOGASTRODUODENOSCOPY  08/24/2011   ESOPHAGOGASTRODUODENOSCOPY; esophageal dilatation; Rogene Houston, MD;  . Freda Munro CAPSULE STUDY  08/18/2011   Procedure: GIVENS CAPSULE STUDY;  Surgeon: Rogene Houston, MD;  Location: [facility];  Service: Endoscopy;  Laterality: N/A;  730  . KNEE ARTHROSCOPY W/ MENISCECTOMY  90's   Left  . ORIF ANKLE FRACTURE Right 90's  . TONSILLECTOMY    . 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 UPPER GASTROINTESTINAL ENDOSCOPY    . URETHRAL DILATION        Social History:      Social History  Substance Use Topics  . Smoking status: Never Smoker  . Smokeless tobacco: Never Used  . Alcohol use No       Family History :     Family History  Problem Relation Age of Onset  . Diabetes Mother   .  Hypertension Mother   . Arthritis Mother   . Heart failure Mother   . Migraines Mother   . Kidney failure Mother   . Leukemia Father   . Migraines Father   . Kidney failure Father   . Diabetes Sister   . Hypertension Sister   . Diabetes Brother   . Hypertension Brother   . Hypertension Brother   . Hypertension Brother   . Hypertension Brother   . Diabetes Brother   . Diabetes Brother   . Diabetes Brother   . Pulmonary embolism Sister   . Migraines Sister   . Kidney failure Brother   . Colon cancer Neg Hx   . Colon polyps Neg Hx       Home Medications:   Prior to Admission medications   Medication Sig Start Date End Date Taking? Authorizing Provider  acyclovir (ZOVIRAX) 400 MG tablet TAKE 1 TABLET (400 MG TOTAL) BY MOUTH 3 (THREE) TIMES DAILY. 08/13/16  Yes [name], MD  amLODipine (NORVASC) 10 MG tablet TAKE 1 TABLET EVERY DAY 08/08/15  Yes [name], MD  atorvastatin (LIPITOR) 40 MG tablet TAKE 1 TABLET (40 MG TOTAL) BY MOUTH DAILY. 05/06/16  Yes [name], MD  busPIRone (BUSPAR) 7.5 MG tablet Take 7.5 mg by mouth 3 (three) times daily.   Yes [provider]  cloNIDine (CATAPRES) 0.1 MG tablet TAKE 1 TABLET BY MOUTH EVERY DAY 02/17/16  Yes [name], MD  cyclobenzaprine (FLEXERIL) 10 MG tablet Obne tablet twice daily Patient taking differently: Take 10 mg by mouth 2 (two) times daily.  04/12/16  Yes [name], MD  donepezil (ARICEPT) 10 MG tablet TAKE 1 TABLET AT BEDTIME 08/08/15  Yes [name], MD  ergocalciferol (VITAMIN D2) 50000 units capsule Take 1 capsule (50,000 Units total) by mouth once a week. One capsule once weekly 07/30/16  Yes [name], MD  gabapentin (NEURONTIN) 300 MG capsule Take 300 mg by mouth at bedtime.   Yes [provider]  hydrOXYzine (ATARAX/VISTARIL) 25 MG tablet Take 1 tablet (25 mg total) by mouth 3 (three) times daily as needed. 11/10/15  Yes [name], MD  imipramine (TOFRANIL) 50 MG tablet TAKE 2 TABLETS AT BEDTIME 08/08/15  Yes [name], MD  linagliptin (TRADJENTA) 5 MG TABS tablet Take 1 tablet (5 mg total) by mouth daily. 07/30/16  Yes [name], MD  meclizine (ANTIVERT) 25 MG tablet TAKE 1 TABLET THREE TIMES DAILY AS NEEDED FOR DIZZINESS. 02/25/15  Yes [name], MD  metFORMIN (GLUCOPHAGE) 500 MG tablet TAKE 1 TABLET TWICE DAILY WITH MEALS 08/08/15  Yes [name], MD  montelukast (SINGULAIR) 10 MG tablet TAKE 1 TABLET (10 MG TOTAL) BY MOUTH AT BEDTIME. Patient taking differently: TAKE 1 TABLET (10 MG TOTAL) BY MOUTH AT BEDTIME AS NEEDED 05/03/16  Yes [name], MD  omeprazole (PRILOSEC) 40 MG capsule TAKE 1 CAPSULE EVERY DAY 08/08/15  Yes [name], MD  ondansetron (ZOFRAN) 4 MG tablet Take 4 mg by mouth 2 (two) times daily as needed for nausea or vomiting.   Yes [provider]  oxyCODONE-acetaminophen (PERCOCET) 10-325 MG tablet Take 1 tablet by mouth 2 (two) times daily. 08/14/16  Yes [name], MD  PRADAXA 150 MG CAPS capsule TAKE 1 CAPSULE EVERY 12 HOURS 08/08/15  Yes Arnoldo Lenis, MD  propranolol (INDERAL) 80 MG tablet TAKE 1 TABLET BY MOUTH EVERY DAY 07/06/16  Yes [name], MD  SUMAtriptan (IMITREX) 5 MG/ACT nasal spray Place 1 spray (5 mg total) into the nose every 2 (two) hours as needed for migraine. One spray into each nostril , may repeat once after 2 hours. No more than 4 sprays in a 24 hour period. No more than twice weekly 10/29/15  Yes [name], MD  temazepam (RESTORIL) 30 MG capsule Take 1 capsule (30 mg total) by mouth at bedtime as needed for sleep. 04/12/16  Yes [name], MD  topiramate (TOPAMAX) 100 MG tablet TAKE 1 TABLET (100 MG TOTAL) BY MOUTH 2 (TWO) TIMES DAILY. 05/31/16  Yes [name], MD  VENTOLIN HFA 108 [phone] Base) MCG/ACT inhaler INHALE 2 PUFFS INTO THE LUNGS EVERY 6 (SIX) HOURS AS NEEDED FOR WHEEZING  OR SHORTNESS OF BREATH. 05/26/16  Yes [name], MD  ACCU-CHEK FASTCLIX LANCETS MISC Once daily testing dx e11.9 11/24/15   [name], MD  Blood Glucose Monitoring Suppl (ACCU-CHEK AVIVA PLUS) w/Device KIT For once daily testing dx E11.9 11/24/15   [name], MD  chlorpheniramine-HYDROcodone (TUSSIONEX PENNKINETIC ER) 10-8 MG/5ML SUER Take 5 mLs by mouth at bedtime as needed for cough. Patient not taking: Reported on 07/14/2016 05/21/16   [name], MD  clotrimazole-betamethasone (LOTRISONE) cream Apply cream twice daily to rash under breasts for 10 days , then as needed Patient not taking: Reported on 08/13/2016 07/14/16   [name], MD  glucose blood (ACCU-CHEK AVIVA) test strip Use as instructed one daily testing dx e11.9 11/24/15   [name], MD  nystatin (MYCOSTATIN/NYSTOP) powder Apply topically 2 (two) times daily. Apply twice daily to rash under breast for 10 days, then as needed Patient not taking: Reported on 08/13/2016 07/14/16   [name], MD  oxyCODONE-acetaminophen (PERCOCET) 10-325 MG tablet Take 1 tablet by mouth 2 (two) times daily. One tablet twice daily for pain Patient not taking: Reported on 08/13/2016 07/14/16   [name], MD  oxyCODONE-acetaminophen (PERCOCET) 10-325 MG tablet Take 1 tablet by mouth every 8 (eight) hours as needed for pain. 09/13/16   [name], MD  tiZANidine (ZANAFLEX) 2 MG tablet TAKE  1 TABLET (2 MG TOTAL) BY MOUTH AT BEDTIME. Patient not taking: Reported on 08/13/2016 04/08/16   [name], MD     Allergies:     Penicillin, fentanyl, sulfonamide derivatives, codeine   Physical Exam:   Vitals  Blood pressure (!) 171/97, pulse 95, temperature 98.2 F (36.8 C), temperature source Oral, resp. rate 20, height _0  (1.702 m), weight 99.8 kg (220 lb), SpO2 98 %.  1.  General: Appears in no acute distress  2. Psychiatric:  Intact judgement and  insight, awake alert,  oriented x 3.  3. Neurologic: No focal neurological deficits, all cranial nerves intact.Strength 5/5 all 4 extremities, sensation intact all 4 extremities, plantars down going.  4. Eyes :  anicteric sclerae, moist conjunctivae with no lid lag. PERRLA.  5. ENMT:  Oropharynx clear with moist mucous membranes and good dentition  6. Neck:  supple, no cervical lymphadenopathy appriciated, No thyromegaly  7. Respiratory : Normal respiratory effort, good air movement bilaterally,clear to  auscultation bilaterally  8. Cardiovascular : RRR, no gallops, rubs or murmurs, no leg edema  9. Gastrointestinal:  Positive bowel sounds, abdomen soft, non-tender to palpation,no hepatosplenomegaly, no rigidity or guarding       10. Skin:  No cyanosis, normal texture and turgor, no rash, lesions or ulcers  11.Musculoskeletal:  Good muscle tone,  joints appear normal , no effusions,  normal range of motion    Data Review:    CBC  Recent Labs Lab 08/13/16 2156  WBC 10.3  HGB 11.8*  HCT 36.7  PLT 340  MCV 87.6  MCH 28.2  MCHC 32.2  RDW 15.3   ------------------------------------------------------------------------------------------------------------------  Chemistries   Recent Labs Lab 08/13/16 2156  NA 140  K 3.3*  CL 103  CO2 25  GLUCOSE 114*  BUN 15  CREATININE 1.16*  CALCIUM 9.2   ------------------------------------------------------------------------------------------------------------------  ------------------------------------------------------------------------------------------------------------------ GFR: Estimated Creatinine Clearance: 50.1 mL/min (A) (by C-G formula based on SCr of 1.16 mg/dL (H)). Liver Function Tests: No results for input(s): AST, ALT, ALKPHOS, BILITOT, PROT, ALBUMIN in the last 168 hours. No results for input(s): LIPASE, AMYLASE in the last 168 hours. No results for input(s): AMMONIA in the last 168 hours. Coagulation Profile: No results  for input(s): INR, PROTIME in the last 168 hours. Cardiac Enzymes: No results for input(s): CKTOTAL, CKMB, CKMBINDEX, TROPONINI in the last 168 hours. BNP (last 3 results) No results for input(s): PROBNP in the last 8760 hours. HbA1C: No results for input(s): HGBA1C in the last 72 hours. CBG: No results for input(s): GLUCAP in the last 168 hours. Lipid Profile: No results for input(s): CHOL, HDL, LDLCALC, TRIG, CHOLHDL, LDLDIRECT in the last 72 hours. Thyroid Function Tests: No results for input(s): TSH, T4TOTAL, FREET4, T3FREE, THYROIDAB in the last 72 hours. Anemia Panel: No results for input(s): VITAMINB12, FOLATE, FERRITIN, TIBC, IRON, RETICCTPCT in the last 72 hours.  ---------------------------------------------------------------------------------------------------------------    Imaging Results:    Dg Chest 2 View  Result Date: 08/13/2016 CLINICAL DATA:  76 year old female with chest pain and shortness of breath for 2 days. EXAM: CHEST  2 VIEW COMPARISON:  11/20/2014 and prior radiographs FINDINGS: The cardiomediastinal silhouette is unremarkable. There is no evidence of focal airspace disease, pulmonary edema, suspicious pulmonary nodule/mass, pleural effusion, or pneumothorax. No acute bony abnormalities are identified. IMPRESSION: No active cardiopulmonary disease. Electronically Signed   By: Margarette Canada M.D.   On: 08/13/2016 22:52    My personal review of EKG: Rhythm NSR, QTc interval 535  Assessment & Plan:    Active Problems:   HTN (hypertension)   Chronic anticoagulation   Type 2 diabetes mellitus with hemoglobin A1c goal of less than 8.0% (HCC)   Chest pain   1. Chest pain/? Atypical/?? GERD-place under observation, cycle cardiac enzymes every 6 hours 3. Her pain improved after GI cocktail. Continue PPI. 2. QTc prolongation- QTc is prolonged, will. Check serum magnesium. 3. Hypokalemia- potassium is 3.3, replaced potassium. Check BMP in a.m. 4. Paroxysmal  atrial fibrillation- heart rate is controlled, continue anticoagulation with Pradaxa. 5. Diabetes mellitus- hold metformin, start sliding scale insulin with NovoLog. 6. Hypertension-blood pressure mildly elevated, continue home medications amlodipine, Catapres, amlodipine. 7. Memory loss/depression with anxiety- continue Aricept, BuSpar    DVT Prophylaxis-   Pradaxa  AM Labs Ordered, also please review Full Orders  Family Communication: Admission, patients condition and plan of care including tests being ordered have been discussed with the patient and her sister at bedside who indicate understanding and agree with the plan and Code Status.  Code Status:  Full code  Admission status: Observation   Time spent in minutes : 60 minutes   Shelena Castelluccio S M.D on 08/14/2016 at 12:12 AM  Between 7am to 7pm - Pager - [phone]. After 7pm go to [email or web address] - password [facility]  [facility] - Office  [phone]

## 2016-08-15 DIAGNOSIS — R9431 Abnormal electrocardiogram [ECG] [EKG]: Secondary | ICD-10-CM

## 2016-08-15 DIAGNOSIS — K219 Gastro-esophageal reflux disease without esophagitis: Secondary | ICD-10-CM | POA: Diagnosis not present

## 2016-08-15 DIAGNOSIS — Z96652 Presence of left artificial knee joint: Secondary | ICD-10-CM | POA: Diagnosis not present

## 2016-08-15 DIAGNOSIS — Z7901 Long term (current) use of anticoagulants: Secondary | ICD-10-CM | POA: Diagnosis not present

## 2016-08-15 DIAGNOSIS — Z7984 Long term (current) use of oral hypoglycemic drugs: Secondary | ICD-10-CM | POA: Diagnosis not present

## 2016-08-15 DIAGNOSIS — Z79899 Other long term (current) drug therapy: Secondary | ICD-10-CM | POA: Diagnosis not present

## 2016-08-15 DIAGNOSIS — I10 Essential (primary) hypertension: Secondary | ICD-10-CM | POA: Diagnosis not present

## 2016-08-15 DIAGNOSIS — R0789 Other chest pain: Secondary | ICD-10-CM | POA: Diagnosis not present

## 2016-08-15 DIAGNOSIS — E119 Type 2 diabetes mellitus without complications: Secondary | ICD-10-CM | POA: Diagnosis not present

## 2016-08-15 DIAGNOSIS — R079 Chest pain, unspecified: Secondary | ICD-10-CM | POA: Diagnosis present

## 2016-08-15 LAB — CBC
HCT: 37.8 % (ref 36.0–46.0)
Hemoglobin: 11.8 g/dL — ABNORMAL LOW (ref 12.0–15.0)
MCH: 28 pg (ref 26.0–34.0)
MCHC: 31.2 g/dL (ref 30.0–36.0)
MCV: 89.6 fL (ref 78.0–100.0)
PLATELETS: 321 10*3/uL (ref 150–400)
RBC: 4.22 MIL/uL (ref 3.87–5.11)
RDW: 15.3 % (ref 11.5–15.5)
WBC: 6.7 10*3/uL (ref 4.0–10.5)

## 2016-08-15 LAB — BASIC METABOLIC PANEL
Anion gap: 9 (ref 5–15)
BUN: 14 mg/dL (ref 6–20)
CALCIUM: 8.9 mg/dL (ref 8.9–10.3)
CHLORIDE: 106 mmol/L (ref 101–111)
CO2: 25 mmol/L (ref 22–32)
Creatinine, Ser: 0.94 mg/dL (ref 0.44–1.00)
GFR calc non Af Amer: 57 mL/min — ABNORMAL LOW (ref >60)
Glucose, Bld: 128 mg/dL — ABNORMAL HIGH (ref 65–99)
Potassium: 3.7 mmol/L (ref 3.5–5.1)
SODIUM: 140 mmol/L (ref 135–145)

## 2016-08-15 LAB — MAGNESIUM: MAGNESIUM: 2 mg/dL (ref 1.7–2.4)

## 2016-08-15 LAB — LIPID PANEL
CHOLESTEROL: 110 mg/dL (ref 0–200)
HDL: 58 mg/dL (ref >40)
LDL Cholesterol: 31 mg/dL (ref 0–99)
Total CHOL/HDL Ratio: 1.9 RATIO
Triglycerides: 104 mg/dL (ref <150)
VLDL: 21 mg/dL (ref 0–40)

## 2016-08-15 LAB — GLUCOSE, CAPILLARY
Glucose-Capillary: 132 mg/dL — ABNORMAL HIGH (ref 65–99)
Glucose-Capillary: 190 mg/dL — ABNORMAL HIGH (ref 65–99)

## 2016-08-15 LAB — HEMOGLOBIN A1C
Hgb A1c MFr Bld: 7.8 % — ABNORMAL HIGH (ref 4.8–5.6)
Mean Plasma Glucose: 177 mg/dL

## 2016-08-15 MED ORDER — MAGNESIUM OXIDE 400 (241.3 MG) MG PO TABS: 400.0000 mg | ORAL_TABLET | Freq: Every day | ORAL | 0 refills | Status: DC

## 2016-08-15 MED ORDER — POTASSIUM CHLORIDE CRYS ER 20 MEQ PO TBCR: 20.0000 meq | EXTENDED_RELEASE_TABLET | Freq: Every day | ORAL | 0 refills | Status: DC

## 2016-08-15 MED ORDER — OMEPRAZOLE 40 MG PO CPDR: 40.0000 mg | DELAYED_RELEASE_CAPSULE | Freq: Two times a day (BID) | ORAL | 1 refills | Status: DC

## 2016-08-15 MED ORDER — IMIPRAMINE HCL 50 MG PO TABS: 50.0000 mg | ORAL_TABLET | Freq: Every day | ORAL | Status: DC

## 2016-08-15 MED ORDER — HYDROXYZINE HCL 25 MG PO TABS: 12.5000 mg | ORAL_TABLET | Freq: Three times a day (TID) | ORAL | Status: DC | PRN

## 2016-08-15 MED ORDER — CYCLOBENZAPRINE HCL 10 MG PO TABS: 10.0000 mg | ORAL_TABLET | Freq: Two times a day (BID) | ORAL | Status: DC

## 2016-08-15 NOTE — Progress Notes (Signed)
Discharge instructions gone over with patient and family, verbalized understanding. Printed prescriptions given to patient. IV removed, patient tolerated procedure well.

## 2016-08-15 NOTE — Discharge Summary (Signed)
Physician Discharge Summary  [name] HDQ:[id number] DOB: [birth year] DOA: 08/13/2016  PCP: [name], MD  Admit date: 08/13/2016 Discharge date: 08/15/2016  Time spent: Greater than 30 minutes  Recommendations for Outpatient Follow-up:  1. Omeprazole was increased to twice a day for one month due to possible GERD causing chest pain. 2. Recommend referral to cardiology for outpatient assessment of chest pain.  3. Hydroxyzine and imipramine dosing were decreased due to prolonged QTC. 4. Recommend follow-up of her serum potassium and magnesium levels.   Discharge Diagnoses:  Principal Problem:   Atypical chest pain Active Problems:   HTN (hypertension)   Prolonged Q-T interval on ECG   Hyperlipemia   Morbid obesity (HCC)   Gastroesophageal reflux disease   Chronic anticoagulation   Type 2 diabetes mellitus with hemoglobin A1c goal of less than 8.0% [facility])   Discharge Condition: Improved  Diet recommendation: Heart healthy/carbohydrate modified  Filed Weights   08/13/16 2145 08/13/16 2150 08/14/16 0051  Weight: 99.8 kg (220 lb) 99.8 kg (220 lb) 100.3 kg (221 lb 3.2 oz)    History of present illness:  Patient is a 76 year old woman with a history of PAF on Pradaxa, DM, HTN, GERD, and hyperlipidemia, who presented to the ED on 08/13/16 with a chief complaint of chest pain. In the ED, she was afebrile and hemodynamically stable, but moderately hypertensive with a systolic blood pressure in the 150s to 170s. Her point-of-care troponin I was 0. Her potassium was 3.3, creatinine 1.16, normal magnesium of 1.7, and her chest x-ray revealed no active cardiopulmonary disease. Her EKG revealed sinus rhythm with a heart rate of 94 bpm and nonspecific T-wave abnormalities. She was admitted for further evaluation and management.  Hospital Course:  1. Atypical chest pain. The patient was given aspirin and a GI cocktail in the ED with relief of her symptoms. Subsequently, PPI was  continued. -As needed oxycodone was ordered for pain. -Troponin I was cycled and remained negative 3. -The patient denied chest pain during the hospitalization. Etiology of her chest pain was not clearly elucidated, but due to her cardiac risk factors, would recommend referral to cardiology for an elective evaluation/stress test.  GERD. Patient has a history of GERD and is prescribed omeprazole at home. She denied missing any doses.  -Protonix  was started on admission and increased to twice a day at the time of discharge. As needed Mylanta was ordered as well.. -Patient was instructed to take omeprazole twice a day for one month and then change back to once daily dosing.  -Further management will be deferred to her PCP.  Prolonged QTC. Patient was noted to have a prolonged QTc interval on the EKG. She is/was  on 4  chronic medications that can cause the elevation  including hydroxyzine, Protonix, imipramine, and Zofran. -The dose of imipramine and hydroxyzine were decreased by half rather than stopping them abruptly.  -She was started on magnesium oxide for borderline hypomagnesemia.  -Recommend further monitoring in the outpatient setting   Essential hypertension. The patient is treated chronically with propanolol, clonidine and amlodipine. They were continued. Her blood pressure remained relatively controlled.   Paroxysmal atrial fibrillation. The patient is chronically anticoagulated with Pradaxa and rate controlled with propanolol. they were both continued.  -Her heart rate remained within normal limits.   Hypokalemia. Patient's potassium was 3.3 on admission. She was given potassium chloride with improvement to 3.5. -She was discharged on 2 more weeks of potassium chloride supplement daily.  -Recommend follow-up recheck  of her serum potassium and magnesium.  Type 2 diabetes mellitus. The patient is treated chronically with Trajenta and metformin. Both were withheld on  admission. -SS I was started. -Her CBGs were controlled during the hospital course.  -She was instructed to resume her home medications without change at the time of discharge.  Hyperlipidemia. Patient was restarted on Lipitor. -Her fasting lipid profile revealed total cholesterol of 110, HDL of 58, LDL of 31, and triglycerides of 104.  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 -Patient is treated chronically with BuSpar and Aricept.They were continued. She remained stable.  -Her TSH was within normal limits at 0.98.  Chronic pain syndrome. The patient is treated chronically with oxycodone/acetaminophen, Zanaflex as needed. They were continued. The patient remained stable.      Procedures:  None  Consultations:  None  Discharge Exam: Vitals:   08/14/16 2119 08/15/16 0510  BP: (!) 148/75 (!) 148/78  Pulse: 74 65  Resp: 20 18  Temp: 98.5 F (36.9 C) 98.1 F (36.7 C)  O2 sats 99% on room air.  General: Pleasant 76 year old woman in no acute distress. Cardiovascular: S1, S2, with a soft systolic murmur. Respiratory: Clear to auscultation bilaterally. Abdomen: Positive bowel sounds, obese, nontender, nondistended. Extremities: No pedal edema.  Discharge Instructions   Discharge Instructions    Diet - low sodium heart healthy    Complete by:  As directed    Diet Carb Modified    Complete by:  As directed    Discharge instructions    Complete by:  As directed    THE DOSE OF HYDROXYZINE AND IMIPRAMINE WERE DECREASED BY HALF DUE TO RECOMMENDATION BY PHARMACIST.   Increase activity slowly    Complete by:  As directed      Current Discharge Medication List    START taking these medications   Details  magnesium oxide (MAG-OX) 400 (241.3 Mg) MG tablet Take 1 tablet (400 mg total) by mouth daily. Qty: 30 tablet, Refills: 0    potassium chloride SA (K-DUR,KLOR-CON) 20 MEQ tablet Take 1 tablet (20 mEq total) by mouth daily. Qty: 14 tablet, Refills: 0       CONTINUE these medications which have CHANGED   Details  cyclobenzaprine (FLEXERIL) 10 MG tablet Take 1 tablet (10 mg total) by mouth 2 (two) times daily.    hydrOXYzine (ATARAX/VISTARIL) 25 MG tablet Take 0.5 tablets (12.5 mg total) by mouth 3 (three) times daily as needed.    imipramine (TOFRANIL) 50 MG tablet Take 1 tablet (50 mg total) by mouth at bedtime.    omeprazole (PRILOSEC) 40 MG capsule Take 1 capsule (40 mg total) by mouth 2 (two) times daily. Qty: 60 capsule, Refills: 1      CONTINUE these medications which have NOT CHANGED   Details  acyclovir (ZOVIRAX) 400 MG tablet TAKE 1 TABLET (400 MG TOTAL) BY MOUTH 3 (THREE) TIMES DAILY. Qty: 30 tablet, Refills: 5    amLODipine (NORVASC) 10 MG tablet TAKE 1 TABLET EVERY DAY Qty: 90 tablet, Refills: 1    atorvastatin (LIPITOR) 40 MG tablet TAKE 1 TABLET (40 MG TOTAL) BY MOUTH DAILY. Qty: 90 tablet, Refills: 1    busPIRone (BUSPAR) 7.5 MG tablet Take 7.5 mg by mouth 3 (three) times daily.    cloNIDine (CATAPRES) 0.1 MG tablet TAKE 1 TABLET BY MOUTH EVERY DAY Qty: 90 tablet, Refills: 1    donepezil (ARICEPT) 10 MG tablet TAKE 1 TABLET AT BEDTIME Qty: 90 tablet, Refills: 1    ergocalciferol (VITAMIN D2)  50000 units capsule Take 1 capsule (50,000 Units total) by mouth once a week. One capsule once weekly Qty: 12 capsule, Refills: 1    gabapentin (NEURONTIN) 300 MG capsule Take 300 mg by mouth at bedtime.    linagliptin (TRADJENTA) 5 MG TABS tablet Take 1 tablet (5 mg total) by mouth daily. Qty: 30 tablet, Refills: 4    meclizine (ANTIVERT) 25 MG tablet TAKE 1 TABLET THREE TIMES DAILY AS NEEDED FOR DIZZINESS. Qty: 270 tablet, Refills: 1    metFORMIN (GLUCOPHAGE) 500 MG tablet TAKE 1 TABLET TWICE DAILY WITH MEALS Qty: 180 tablet, Refills: 1    montelukast (SINGULAIR) 10 MG tablet TAKE 1 TABLET (10 MG TOTAL) BY MOUTH AT BEDTIME. Qty: 90 tablet, Refills: 1    ondansetron (ZOFRAN) 4 MG tablet Take 4 mg by mouth 2 (two)  times daily as needed for nausea or vomiting.    oxyCODONE-acetaminophen (PERCOCET) 10-325 MG tablet Take 1 tablet by mouth 2 (two) times daily. Qty: 60 tablet, Refills: 0   Associated Diagnoses: Chronic pain syndrome    PRADAXA 150 MG CAPS capsule TAKE 1 CAPSULE EVERY 12 HOURS Qty: 180 capsule, Refills: 3    propranolol (INDERAL) 80 MG tablet TAKE 1 TABLET BY MOUTH EVERY DAY Qty: 90 tablet, Refills: 1    SUMAtriptan (IMITREX) 5 MG/ACT nasal spray Place 1 spray (5 mg total) into the nose every 2 (two) hours as needed for migraine. One spray into each nostril , may repeat once after 2 hours. No more than 4 sprays in a 24 hour period. No more than twice weekly Qty: 1 Inhaler, Refills: 2   Associated Diagnoses: Migraine with aura and without status migrainosus, not intractable    temazepam (RESTORIL) 30 MG capsule Take 1 capsule (30 mg total) by mouth at bedtime as needed for sleep. Qty: 30 capsule, Refills: 5    topiramate (TOPAMAX) 100 MG tablet TAKE 1 TABLET (100 MG TOTAL) BY MOUTH 2 (TWO) TIMES DAILY. Qty: 180 tablet, Refills: 1    VENTOLIN HFA 108 (90 Base) MCG/ACT inhaler INHALE 2 PUFFS INTO THE LUNGS EVERY 6 (SIX) HOURS AS NEEDED FOR WHEEZING OR SHORTNESS OF BREATH. Qty: 18 Inhaler, Refills: 3    ACCU-CHEK FASTCLIX LANCETS MISC Once daily testing dx e11.9 Qty: 50 each, Refills: 5    Blood Glucose Monitoring Suppl (ACCU-CHEK AVIVA PLUS) w/Device KIT For once daily testing dx E11.9 Qty: 1 kit, Refills: 0    glucose blood (ACCU-CHEK AVIVA) test strip Use as instructed one daily testing dx e11.9 Qty: 50 each, Refills: 5    nystatin (MYCOSTATIN/NYSTOP) powder Apply topically 2 (two) times daily. Apply twice daily to rash under breast for 10 days, then as needed Qty: 45 g, Refills: 1   Associated Diagnoses: Dermatomycosis      STOP taking these medications     chlorpheniramine-HYDROcodone (TUSSIONEX PENNKINETIC ER) 10-8 MG/5ML SUER      clotrimazole-betamethasone  (LOTRISONE) cream      tiZANidine (ZANAFLEX) 2 MG tablet        Allergies  Allergen Reactions  . Penicillins Other (See Comments)      . Fentanyl Itching    Patient just started the patch on 08-08-2011 and she has noted itching, but states that it is helping.  . Sulfonamide Derivatives Hives  . Codeine Itching and Rash    tussionex  Is tolerated by patient, no phenergan dm    Follow-up Information    [name], MD. Schedule an appointment as soon as possible for  a visit in 1 week(s).   Specialty:  Family Medicine Why:  Please discuss referral for cardiology consultation for a stress test with Dr. Moshe Cipro. Contact information: [address] [place] [address] [phone]            The results of significant diagnostics from this hospitalization (including imaging, microbiology, ancillary and laboratory) are listed below for reference.    Significant Diagnostic Studies: Dg Chest 2 View  Result Date: 08/13/2016 CLINICAL DATA:  76 year old female with chest pain and shortness of breath for 2 days. EXAM: CHEST  2 VIEW COMPARISON:  11/20/2014 and prior radiographs FINDINGS: The cardiomediastinal silhouette is unremarkable. There is no evidence of focal airspace disease, pulmonary edema, suspicious pulmonary nodule/mass, pleural effusion, or pneumothorax. No acute bony abnormalities are identified. IMPRESSION: No active cardiopulmonary disease. Electronically Signed   By: Margarette Canada M.D.   On: 08/13/2016 22:52    Microbiology: No results found for this or any previous visit (from the past 240 hour(s)).   Labs: Basic Metabolic Panel:  Recent Labs Lab 08/13/16 2156 08/14/16 0440 08/15/16 0550  NA 140 140 140  K 3.3* 3.5 3.7  CL 103 104 106  CO2 _0 GLUCOSE 114* 130* 128*  BUN _1 CREATININE 1.16* 1.10* 0.94  CALCIUM 9.2 8.8* 8.9  MG 1.7  --  2.0   Liver Function Tests:  Recent Labs Lab 08/14/16 0440  AST 22  ALT 17   ALKPHOS 87  BILITOT 0.2*  PROT 6.8  ALBUMIN 3.5   No results for input(s): LIPASE, AMYLASE in the last 168 hours. No results for input(s): AMMONIA in the last 168 hours. CBC:  Recent Labs Lab 08/13/16 2156 08/15/16 0550  WBC 10.3 6.7  HGB 11.8* 11.8*  HCT 36.7 37.8  MCV 87.6 89.6  PLT 340 321   Cardiac Enzymes:  Recent Labs Lab 08/14/16 0440 08/14/16 1049 08/14/16 1623  TROPONINI <0.03 <0.03 <0.03   BNP: BNP (last 3 results) No results for input(s): BNP in the last 8760 hours.  ProBNP (last 3 results) No results for input(s): PROBNP in the last 8760 hours.  CBG:  Recent Labs Lab 08/14/16 1159 08/14/16 1635 08/14/16 2122 08/15/16 0736 08/15/16 1107  GLUCAP 150* 144* 128* 132* 190*       Signed:  , MD.  [facility] 08/15/2016, 12:59 PM

## 2016-08-16 ENCOUNTER — Other Ambulatory Visit: Payer: Self-pay | Admitting: Family Medicine

## 2016-08-19 ENCOUNTER — Telehealth: Payer: Self-pay | Admitting: *Deleted

## 2016-08-19 NOTE — Telephone Encounter (Signed)
[name] called left message for patient to schedule a hospital follow up per [facility] patient was discharged Sunday. When can this be scheduled?  [phone]

## 2016-08-19 NOTE — Telephone Encounter (Signed)
pls give 1:40 pm appt this Monday

## 2016-08-23 ENCOUNTER — Encounter: Payer: Self-pay | Admitting: Family Medicine

## 2016-08-23 ENCOUNTER — Ambulatory Visit (INDEPENDENT_AMBULATORY_CARE_PROVIDER_SITE_OTHER): Payer: PPO | Admitting: Family Medicine

## 2016-08-23 VITALS — BP 152/86 | HR 75 | Temp 97.5°F | Resp 18 | Ht 67.0 in | Wt 219.8 lb

## 2016-08-23 DIAGNOSIS — K219 Gastro-esophageal reflux disease without esophagitis: Secondary | ICD-10-CM | POA: Diagnosis not present

## 2016-08-23 DIAGNOSIS — Z87898 Personal history of other specified conditions: Secondary | ICD-10-CM | POA: Diagnosis not present

## 2016-08-23 DIAGNOSIS — R079 Chest pain, unspecified: Secondary | ICD-10-CM

## 2016-08-23 DIAGNOSIS — G43109 Migraine with aura, not intractable, without status migrainosus: Secondary | ICD-10-CM | POA: Diagnosis not present

## 2016-08-23 DIAGNOSIS — Z09 Encounter for follow-up examination after completed treatment for conditions other than malignant neoplasm: Secondary | ICD-10-CM

## 2016-08-23 DIAGNOSIS — I10 Essential (primary) hypertension: Secondary | ICD-10-CM

## 2016-08-23 DIAGNOSIS — R1310 Dysphagia, unspecified: Secondary | ICD-10-CM | POA: Diagnosis not present

## 2016-08-23 DIAGNOSIS — R1319 Other dysphagia: Secondary | ICD-10-CM

## 2016-08-23 MED ORDER — SUMATRIPTAN 5 MG/ACT NA SOLN: 1.0000 | NASAL | 2 refills | Status: DC | PRN

## 2016-08-23 MED ORDER — CLONIDINE HCL 0.2 MG PO TABS: 0.2000 mg | ORAL_TABLET | Freq: Two times a day (BID) | ORAL | 3 refills | Status: DC

## 2016-08-23 NOTE — Patient Instructions (Addendum)
F/u as before, call if you need  Me sooner   Dose reductions per discharge instructions from hospital  You are being referred to Dr Laural Golden and to the cardiologist   You will get to your home for a few nights as your family has promised .  Please check your blood sugar once daily  Blood pressure is high, so clomnidine dose is increased to 0.2 mg one at bedtime  Thank you  for choosing [facility]. We consider it a privelige to serve you.  Delivering excellent health care in a caring and  compassionate way is our goal.  Partnering with you,  so that together we can achieve this goal is our strategy.

## 2016-08-25 DIAGNOSIS — Z09 Encounter for follow-up examination after completed treatment for conditions other than malignant neoplasm: Secondary | ICD-10-CM | POA: Insufficient documentation

## 2016-08-25 NOTE — Assessment & Plan Note (Signed)
Symptomatically improved . Currently on short term high dose PPI. Needs cardiology and GI evaluations.

## 2016-08-25 NOTE — Assessment & Plan Note (Signed)
Uncontrolled with chest pain and dysphagia, recently hospitalized and currently on high dose PPI refer to GI , feels as though she need dilation

## 2016-08-25 NOTE — Assessment & Plan Note (Signed)
Uncontrolled, increase bedtime clonidine dose to 0.2mg   DASH diet and commitment to daily physical activity for a minimum of 30 minutes discussed and encouraged, as a part of hypertension management. The importance of attaining a healthy weight is also discussed.  BP/Weight 08/23/2016 08/15/2016 08/14/2016 07/14/2016 04/12/2016 01/14/2016 66/29/4765  Systolic BP 465 035 - 465 681 275 170  Diastolic BP 86 78 - 80 82 70 92  Wt. (Lbs) 219.75 - 221.2 232 229 220 -  BMI 34.42 - 34.64 36.34 35.87 34.46 -

## 2016-08-25 NOTE — Progress Notes (Signed)
   [name]     MRN: [id number]      DOB: Sep 22, 1940   HPI [name] is here for follow up of recent hospitalization from 6/29 to 08/15/2016 with a principal diagnosis of atypical chest pain. Med dose decreases are made in hydroxyzine to 50 mg one at bedtime as needed, and imipramine 25 mg half tab 3 times daily as needed due to prolonged QTC Requires both cardiology and GI follow up, currently on high dose PPI and reports dysphagia, blood pressure also increased ROS  See HPI  Denies chest congestion, productive cough or wheezing. Denies chest pains, palpitations and leg swelling C/o dysphagia and epigastric pain.   Denies dysuria, frequency, hesitancy or incontinence. C/o  joint pain,  and limitation in mobility. Denies uncontrolled  headaches, seizures, numbness, or tingling. Denies depression,c/o  anxiety and mild  insomnia. Denies skin break down or rash.   PE  BP (!) 152/86 (BP Location: Right Arm, Patient Position: Sitting, Cuff Size: Normal)   Pulse 75   Temp (!) 97.5 F (36.4 C) (Other (Comment))   Resp 18   Ht 5\' 7"  (1.702 m)   Wt 219 lb 12 oz (99.7 kg)   SpO2 97%   BMI 34.42 kg/m   Patient alert and oriented and in no cardiopulmonary distress.  HEENT: No facial asymmetry, EOMI,   oropharynx pink and moist.  Neck supple no JVD, no mass.  Chest: Clear to auscultation bilaterally.  CVS: S1, S2 no murmurs, no S3.Regular rate.  ABD: Soft non tender.   Ext: No edema  MS: Adequate though reduced  ROM spine, shoulders, hips and knees.  Skin: Intact, no ulcerations or rash noted.  Psych: Good eye contact, normal affect. Memory intact not anxious or depressed appearing.  CNS: CN 2-12 intact, power,  normal throughout.no focal deficits noted.   [facility] discharge follow-up Symptomatically improved . Currently on short term high dose PPI. Needs cardiology and GI evaluations.   Gastroesophageal reflux disease Uncontrolled with chest  pain and dysphagia, recently hospitalized and currently on high dose PPI refer to GI , feels as though she need dilation  HTN (hypertension) Uncontrolled, increase bedtime clonidine dose to 0.2mg   DASH diet and commitment to daily physical activity for a minimum of 30 minutes discussed and encouraged, as a part of hypertension management. The importance of attaining a healthy weight is also discussed.  BP/Weight 08/23/2016 08/15/2016 08/14/2016 07/14/2016 04/12/2016 01/14/2016 11/94/1740  Systolic BP 814 481 - 856 314 970 263  Diastolic BP 86 78 - 80 82 70 92  Wt. (Lbs) 219.75 - 221.2 232 229 220 -  BMI 34.42 - 34.64 36.34 35.87 34.46 -

## 2016-08-26 ENCOUNTER — Ambulatory Visit: Payer: Self-pay | Admitting: Family Medicine

## 2016-08-27 ENCOUNTER — Encounter (INDEPENDENT_AMBULATORY_CARE_PROVIDER_SITE_OTHER): Payer: Self-pay | Admitting: Internal Medicine

## 2016-08-30 ENCOUNTER — Other Ambulatory Visit: Payer: Self-pay | Admitting: Family Medicine

## 2016-09-05 ENCOUNTER — Other Ambulatory Visit: Payer: Self-pay | Admitting: Family Medicine

## 2016-09-06 ENCOUNTER — Encounter (INDEPENDENT_AMBULATORY_CARE_PROVIDER_SITE_OTHER): Payer: Self-pay | Admitting: *Deleted

## 2016-09-06 ENCOUNTER — Encounter (INDEPENDENT_AMBULATORY_CARE_PROVIDER_SITE_OTHER): Payer: Self-pay | Admitting: Internal Medicine

## 2016-09-06 ENCOUNTER — Encounter (INDEPENDENT_AMBULATORY_CARE_PROVIDER_SITE_OTHER): Payer: Self-pay

## 2016-09-06 ENCOUNTER — Ambulatory Visit (INDEPENDENT_AMBULATORY_CARE_PROVIDER_SITE_OTHER): Payer: PPO | Admitting: Internal Medicine

## 2016-09-06 VITALS — BP 120/80 | HR 64 | Temp 97.5°F | Ht 67.0 in | Wt 223.7 lb

## 2016-09-06 DIAGNOSIS — R1310 Dysphagia, unspecified: Secondary | ICD-10-CM

## 2016-09-06 DIAGNOSIS — R1319 Other dysphagia: Secondary | ICD-10-CM

## 2016-09-06 NOTE — Patient Instructions (Addendum)
DG esophagram. Further recommendations to follow.  

## 2016-09-06 NOTE — Progress Notes (Addendum)
Subjective:    Patient ID: [name], female    DOB: [birth year], 76 y.o.   MRN: [id number]  HPI Presents today with c/o dysphagia. Symptoms for a long time. She is scared to eat.  She states she strangles on water at time. Meats give her a lot of trouble.  Appetite is good. No weight loss. Has a BM about daily. No melena or BRRB.  Last EGD in 2013 for IDA and recurrent GI bleed.  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 It was coagulated with Gold probe. Local gastritis at antrum and fundus.  Review of Systems Past Medical History:  Diagnosis Date  . Anemia, iron deficiency 2012   Evaluated by Dr. Oneida Alar; H&H of 9.3/30.8 with [place] in 10/2010; 3/3 positive Hemoccult cards in 07/2011  . Anxiety    was on Prozac for a couple of weeks  . Atrial fibrillation ([place])    takes Coumadin daily  . Bruises easily    takes Coumadin  . Chronic anticoagulation 07/21/2010  . Chronic back pain    related to knee pain  . Degenerative joint disease    Knees  . Depression   . Diabetes mellitus    takes Metformin daily  . Gastroesophageal reflux disease    takes Omeprazole daily  . Glaucoma   . Glaucoma    uses eye drops at night  . History of blood transfusion 1969  . History of gout    was on medication but taken off of several months ago  . Hx of colonic polyps   . Hx of migraines    last one about a yr ago;takes Topamax daily  . Hyperlipidemia    takes Pravastatin daily  . Hypertension    takes Hyzaar daily and Propranolol   . Insomnia    takes Restoril prn  . Joint pain   . Joint swelling   . Memory loss    takes donepezil  . Migraine   . Migraine   . Nocturia   . Overweight(278.02)   . Pneumonia 1969   hx of  . Pulmonary embolism [facility]) May 2012   acute presentation, bilateral PE  . Skin spots-aging   . Urinary frequency     Past Surgical  History:  Procedure Laterality Date  . CATARACT EXTRACTION W/PHACO Right 04/11/2012   Procedure: CATARACT EXTRACTION PHACO AND INTRAOCULAR LENS PLACEMENT (IOC);  Surgeon: Elta Guadeloupe T. Gershon Crane, MD;  Location: [facility];  Service: Ophthalmology;  Laterality: Right;  CDE=9.61  . CATARACT EXTRACTION W/PHACO Left 12/26/2012   Procedure: CATARACT EXTRACTION PHACO AND INTRAOCULAR LENS PLACEMENT (IOC);  Surgeon: Elta Guadeloupe T. Gershon Crane, MD;  Location: [facility];  Service: Ophthalmology;  Laterality: Left;  CDE:7.74  . COLONOSCOPY  Jan 2002; 2012   2002: Dr. Tamala Julian, ext. hemorrhoids, nl colon; 2012-adenomatous polyps, gastritis on EGD  . DILATION AND CURETTAGE OF UTERUS    . ESOPHAGOGASTRODUODENOSCOPY  08/24/2011   ESOPHAGOGASTRODUODENOSCOPY; esophageal dilatation; Rogene Houston, MD;  . Freda Munro CAPSULE STUDY  08/18/2011   Procedure: GIVENS CAPSULE STUDY;  Surgeon: Rogene Houston, MD;  Location: [facility];  Service: Endoscopy;  Laterality: N/A;  730  . KNEE ARTHROSCOPY W/ MENISCECTOMY  90's   Left  . ORIF ANKLE FRACTURE Right 90's  . TONSILLECTOMY    . TOTAL KNEE ARTHROPLASTY  10/20/2011   Procedure: TOTAL KNEE ARTHROPLASTY;  Surgeon: Ninetta Lights,  MD;  Location: [place];  Service: Orthopedics;  Laterality: Left;  . UPPER GASTROINTESTINAL ENDOSCOPY    . URETHRAL DILATION        Current Outpatient Prescriptions on File Prior to Visit  Medication Sig Dispense Refill  . amLODipine (NORVASC) 10 MG tablet TAKE 1 TABLET EVERY DAY 90 tablet 1  . atorvastatin (LIPITOR) 40 MG tablet TAKE 1 TABLET (40 MG TOTAL) BY MOUTH DAILY. 90 tablet 1  . busPIRone (BUSPAR) 7.5 MG tablet Take 7.5 mg by mouth 3 (three) times daily.    . cloNIDine (CATAPRES) 0.2 MG tablet Take 1 tablet (0.2 mg total) by mouth 2 (two) times daily. 30 tablet 3  . cyclobenzaprine (FLEXERIL) 10 MG tablet Take 1 tablet (10 mg total) by mouth 2 (two) times daily.    Marland Kitchen donepezil (ARICEPT) 10 MG tablet TAKE 1 TABLET AT BEDTIME 90 tablet 1  . ergocalciferol  (VITAMIN D2) 50000 units capsule Take 1 capsule (50,000 Units total) by mouth once a week. One capsule once weekly 12 capsule 1  . gabapentin (NEURONTIN) 300 MG capsule Take 300 mg by mouth at bedtime.    . meclizine (ANTIVERT) 25 MG tablet TAKE 1 TABLET THREE TIMES DAILY AS NEEDED FOR DIZZINESS. 270 tablet 1  . metFORMIN (GLUCOPHAGE) 500 MG tablet TAKE 1 TABLET TWICE DAILY WITH MEALS 180 tablet 1  . omeprazole (PRILOSEC) 40 MG capsule Take 1 capsule (40 mg total) by mouth 2 (two) times daily. 60 capsule 1  . oxyCODONE-acetaminophen (PERCOCET) 10-325 MG tablet Take 1 tablet by mouth 2 (two) times daily. (Patient taking differently: Take 1 tablet by mouth as needed. ) 60 tablet 0  . PRADAXA 150 MG CAPS capsule TAKE 1 CAPSULE EVERY 12 HOURS 180 capsule 3  . propranolol (INDERAL) 80 MG tablet TAKE 1 TABLET BY MOUTH EVERY DAY 90 tablet 1  . topiramate (TOPAMAX) 100 MG tablet TAKE 1 TABLET (100 MG TOTAL) BY MOUTH 2 (TWO) TIMES DAILY. 180 tablet 1  . ACCU-CHEK FASTCLIX LANCETS MISC Once daily testing dx e11.9 (Patient not taking: Reported on 09/06/2016) 50 each 5  . acyclovir (ZOVIRAX) 400 MG tablet TAKE 1 TABLET (400 MG TOTAL) BY MOUTH 3 (THREE) TIMES DAILY. (Patient not taking: Reported on 09/06/2016) 30 tablet 5  . Blood Glucose Monitoring Suppl (ACCU-CHEK AVIVA PLUS) w/Device KIT For once daily testing dx E11.9 (Patient not taking: Reported on 09/06/2016) 1 kit 0  . glucose blood (ACCU-CHEK AVIVA) test strip Use as instructed one daily testing dx e11.9 50 each 5  . hydrOXYzine (ATARAX/VISTARIL) 25 MG tablet Take 0.5 tablets (12.5 mg total) by mouth 3 (three) times daily as needed. (Patient not taking: Reported on 08/23/2016)    . imipramine (TOFRANIL) 50 MG tablet TAKE 2 TABLETS BY MOUTH AT BEDTIME (Patient not taking: Reported on 09/06/2016) 180 tablet 0  . linagliptin (TRADJENTA) 5 MG TABS tablet Take 1 tablet (5 mg total) by mouth daily. (Patient not taking: Reported on 08/23/2016) 30 tablet 4  . magnesium  oxide (MAG-OX) 400 (241.3 Mg) MG tablet Take 1 tablet (400 mg total) by mouth daily. (Patient not taking: Reported on 09/06/2016) 30 tablet 0  . montelukast (SINGULAIR) 10 MG tablet TAKE 1 TABLET (10 MG TOTAL) BY MOUTH AT BEDTIME. (Patient taking differently: TAKE 1 TABLET (10 MG TOTAL) BY MOUTH AT BEDTIME AS NEEDED) 90 tablet 1  . nystatin (MYCOSTATIN/NYSTOP) powder Apply topically 2 (two) times daily. Apply twice daily to rash under breast for 10 days, then as needed (Patient not  taking: Reported on 08/13/2016) 45 g 1  . ondansetron (ZOFRAN) 4 MG tablet Take 4 mg by mouth 2 (two) times daily as needed for nausea or vomiting.    . potassium chloride SA (K-DUR,KLOR-CON) 20 MEQ tablet Take 1 tablet (20 mEq total) by mouth daily. (Patient not taking: Reported on 09/06/2016) 14 tablet 0  . SUMAtriptan (IMITREX) 5 MG/ACT nasal spray Place 1 spray (5 mg total) into the nose every 2 (two) hours as needed for migraine. One spray into each nostril , may repeat once after 2 hours. No more than 4 sprays in a 24 hour period. No more than twice weekly (Patient not taking: Reported on 09/06/2016) 1 Inhaler 2  . temazepam (RESTORIL) 30 MG capsule Take 1 capsule (30 mg total) by mouth at bedtime as needed for sleep. (Patient not taking: Reported on 09/06/2016) 30 capsule 5  . VENTOLIN HFA 108 (90 Base) MCG/ACT inhaler INHALE 2 PUFFS INTO THE LUNGS EVERY 6 (SIX) HOURS AS NEEDED FOR WHEEZING OR SHORTNESS OF BREATH. (Patient not taking: Reported on 09/06/2016) 18 Inhaler 3   Current Facility-Administered Medications on File Prior to Visit  Medication Dose Route Frequency Provider Last Rate Last Dose  . fentaNYL (SUBLIMAZE) injection 25-50 mcg  25-50 mcg Intravenous Q5 min PRN Lerry Liner, MD           Objective:   Physical Exam Blood pressure 120/80, pulse 64, temperature (!) 97.5 F (36.4 C), height 5' 7"  (1.702 m), weight 223 lb 11.2 oz (101.5 kg). Alert and oriented. Skin warm and dry. Oral mucosa is moist.   .  Sclera anicteric, conjunctivae is pink. Thyroid not enlarged. No cervical lymphadenopathy. Lungs clear. Heart regular rate and rhythm.  Abdomen is soft. Bowel sounds are positive. No hepatomegaly. No abdominal masses felt. No tenderness.  No edema to lower extremities.          Assessment & Plan:  Dysphagia to solids and liquids. Am going to get a DG Esophagram.

## 2016-09-10 ENCOUNTER — Ambulatory Visit (HOSPITAL_COMMUNITY)
Admission: RE | Admit: 2016-09-10 | Discharge: 2016-09-10 | Disposition: A | Discharge status: Home / Self Care | Payer: PPO | Source: Ambulatory Visit | Attending: Internal Medicine | Admitting: Internal Medicine

## 2016-09-10 ENCOUNTER — Ambulatory Visit (HOSPITAL_COMMUNITY): Payer: PPO

## 2016-09-10 DIAGNOSIS — R1310 Dysphagia, unspecified: Secondary | ICD-10-CM | POA: Diagnosis not present

## 2016-09-10 DIAGNOSIS — R1319 Other dysphagia: Secondary | ICD-10-CM

## 2016-09-10 DIAGNOSIS — K225 Diverticulum of esophagus, acquired: Secondary | ICD-10-CM | POA: Diagnosis not present

## 2016-09-11 ENCOUNTER — Other Ambulatory Visit: Payer: Self-pay | Admitting: Family Medicine

## 2016-09-16 NOTE — Progress Notes (Deleted)
Cardiology Office Note   Date:  09/16/2016   ID:  [name], DOB [birth year], MRN [id number]  PCP:  [name], MD  Cardiologist: Lennie Muckle chief complaint on file.     History of Present Illness: [name] is a 76 y.o. female who presents for posthospitalization follow-up after admission for atypical chest pain, with history of hypertension, prolonged QT interval, morbid obesity, chronic anticoagulation for history of PE, and type 2 diabetes. The patient was admitted to [facility] on 08/13/2016 with chief complaint of chest pain. She is found to be hypertensive. The patient became asymptomatic after given an aspirin and a GI cocktail in the emergency room. She was ruled out for ACS by a negative EKG and troponin. She was started on PPI for GERD symptoms. She was continued on propanolol, clonidine and amlodipine for blood pressure control.    Past Medical History:  Diagnosis Date  . Anemia, iron deficiency 2012   Evaluated by Dr. Oneida Alar; H&H of 9.3/30.8 with [place] in 10/2010; 3/3 positive Hemoccult cards in 07/2011  . Anxiety    was on Prozac for a couple of weeks  . Atrial fibrillation ([place])    takes Coumadin daily  . Bruises easily    takes Coumadin  . Chronic anticoagulation 07/21/2010  . Chronic back pain    related to knee pain  . Degenerative joint disease    Knees  . Depression   . Diabetes mellitus    takes Metformin daily  . Gastroesophageal reflux disease    takes Omeprazole daily  . Glaucoma   . Glaucoma    uses eye drops at night  . History of blood transfusion 1969  . History of gout    was on medication but taken off of several months ago  . Hx of colonic polyps   . Hx of migraines    last one about a yr ago;takes Topamax daily  . Hyperlipidemia    takes Pravastatin daily  . Hypertension    takes Hyzaar daily and Propranolol   . Insomnia    takes Restoril prn  . Joint pain   . Joint swelling   . Memory loss    takes donepezil    . Migraine   . Migraine   . Nocturia   . Overweight(278.02)   . Pneumonia 1969   hx of  . Pulmonary embolism [facility]) May 2012   acute presentation, bilateral PE  . Skin spots-aging   . Urinary frequency     Past Surgical History:  Procedure Laterality Date  . CATARACT EXTRACTION W/PHACO Right 04/11/2012   Procedure: CATARACT EXTRACTION PHACO AND INTRAOCULAR LENS PLACEMENT (IOC);  Surgeon: Elta Guadeloupe T. Gershon Crane, MD;  Location: [facility];  Service: Ophthalmology;  Laterality: Right;  CDE=9.61  . CATARACT EXTRACTION W/PHACO Left 12/26/2012   Procedure: CATARACT EXTRACTION PHACO AND INTRAOCULAR LENS PLACEMENT (IOC);  Surgeon: Elta Guadeloupe T. Gershon Crane, MD;  Location: [facility];  Service: Ophthalmology;  Laterality: Left;  CDE:7.74  . COLONOSCOPY  Jan 2002; 2012   2002: Dr. Tamala Julian, ext. hemorrhoids, nl colon; 2012-adenomatous polyps, gastritis on EGD  . DILATION AND CURETTAGE OF UTERUS    . ESOPHAGOGASTRODUODENOSCOPY  08/24/2011   ESOPHAGOGASTRODUODENOSCOPY; esophageal dilatation; Rogene Houston, MD;  . Freda Munro CAPSULE STUDY  08/18/2011   Procedure: GIVENS CAPSULE STUDY;  Surgeon: Rogene Houston, MD;  [facility];  Service: Endoscopy;  Laterality: N/A;  730  . KNEE ARTHROSCOPY W/ MENISCECTOMY  90's   Left  .  ORIF ANKLE FRACTURE Right 90's  . TONSILLECTOMY    . TOTAL KNEE ARTHROPLASTY  10/20/2011   Procedure: TOTAL KNEE ARTHROPLASTY;  Surgeon: Ninetta Lights, MD;  Location: [place];  Service: Orthopedics;  Laterality: Left;  . UPPER GASTROINTESTINAL ENDOSCOPY    . URETHRAL DILATION       Current Outpatient Prescriptions  Medication Sig Dispense Refill  . ACCU-CHEK FASTCLIX LANCETS MISC Once daily testing dx e11.9 (Patient not taking: Reported on 09/06/2016) 50 each 5  . acyclovir (ZOVIRAX) 400 MG tablet TAKE 1 TABLET (400 MG TOTAL) BY MOUTH 3 (THREE) TIMES DAILY. (Patient not taking: Reported on 09/06/2016) 30 tablet 5  . amLODipine (NORVASC) 10 MG tablet TAKE 1 TABLET BY MOUTH EVERY DAY 90 tablet 0   . atorvastatin (LIPITOR) 40 MG tablet TAKE 1 TABLET (40 MG TOTAL) BY MOUTH DAILY. 90 tablet 1  . Blood Glucose Monitoring Suppl (ACCU-CHEK AVIVA PLUS) w/Device KIT For once daily testing dx E11.9 (Patient not taking: Reported on 09/06/2016) 1 kit 0  . busPIRone (BUSPAR) 7.5 MG tablet Take 7.5 mg by mouth 3 (three) times daily.    . cloNIDine (CATAPRES) 0.2 MG tablet Take 1 tablet (0.2 mg total) by mouth 2 (two) times daily. 30 tablet 3  . cyclobenzaprine (FLEXERIL) 10 MG tablet Take 1 tablet (10 mg total) by mouth 2 (two) times daily.    Marland Kitchen donepezil (ARICEPT) 10 MG tablet TAKE 1 TABLET AT BEDTIME 90 tablet 1  . ergocalciferol (VITAMIN D2) 50000 units capsule Take 1 capsule (50,000 Units total) by mouth once a week. One capsule once weekly 12 capsule 1  . gabapentin (NEURONTIN) 300 MG capsule Take 300 mg by mouth at bedtime.    Marland Kitchen glucose blood (ACCU-CHEK AVIVA) test strip Use as instructed one daily testing dx e11.9 50 each 5  . hydrOXYzine (ATARAX/VISTARIL) 25 MG tablet Take 0.5 tablets (12.5 mg total) by mouth 3 (three) times daily as needed. (Patient not taking: Reported on 08/23/2016)    . imipramine (TOFRANIL) 50 MG tablet TAKE 2 TABLETS BY MOUTH AT BEDTIME (Patient not taking: Reported on 09/06/2016) 180 tablet 0  . linagliptin (TRADJENTA) 5 MG TABS tablet Take 1 tablet (5 mg total) by mouth daily. (Patient not taking: Reported on 08/23/2016) 30 tablet 4  . magnesium oxide (MAG-OX) 400 (241.3 Mg) MG tablet Take 1 tablet (400 mg total) by mouth daily. (Patient not taking: Reported on 09/06/2016) 30 tablet 0  . meclizine (ANTIVERT) 25 MG tablet TAKE 1 TABLET THREE TIMES DAILY AS NEEDED FOR DIZZINESS. 270 tablet 1  . metFORMIN (GLUCOPHAGE) 500 MG tablet TAKE 1 TABLET TWICE DAILY WITH MEALS 180 tablet 1  . montelukast (SINGULAIR) 10 MG tablet TAKE 1 TABLET (10 MG TOTAL) BY MOUTH AT BEDTIME. (Patient taking differently: TAKE 1 TABLET (10 MG TOTAL) BY MOUTH AT BEDTIME AS NEEDED) 90 tablet 1  . montelukast  (SINGULAIR) 10 MG tablet Take 10 mg by mouth at bedtime.    Marland Kitchen nystatin (MYCOSTATIN/NYSTOP) powder Apply topically 2 (two) times daily. Apply twice daily to rash under breast for 10 days, then as needed (Patient not taking: Reported on 08/13/2016) 45 g 1  . omeprazole (PRILOSEC) 40 MG capsule Take 1 capsule (40 mg total) by mouth 2 (two) times daily. 60 capsule 1  . ondansetron (ZOFRAN) 4 MG tablet Take 4 mg by mouth 2 (two) times daily as needed for nausea or vomiting.    Marland Kitchen oxyCODONE-acetaminophen (PERCOCET) 10-325 MG tablet Take 1 tablet by mouth 2 (two)  times daily. (Patient taking differently: Take 1 tablet by mouth as needed. ) 60 tablet 0  . potassium chloride SA (K-DUR,KLOR-CON) 20 MEQ tablet Take 1 tablet (20 mEq total) by mouth daily. (Patient not taking: Reported on 09/06/2016) 14 tablet 0  . PRADAXA 150 MG CAPS capsule TAKE 1 CAPSULE EVERY 12 HOURS 180 capsule 3  . propranolol (INDERAL) 80 MG tablet TAKE 1 TABLET BY MOUTH EVERY DAY 90 tablet 1  . SUMAtriptan (IMITREX) 5 MG/ACT nasal spray Place 1 spray (5 mg total) into the nose every 2 (two) hours as needed for migraine. One spray into each nostril , may repeat once after 2 hours. No more than 4 sprays in a 24 hour period. No more than twice weekly (Patient not taking: Reported on 09/06/2016) 1 Inhaler 2  . temazepam (RESTORIL) 30 MG capsule Take 1 capsule (30 mg total) by mouth at bedtime as needed for sleep. (Patient not taking: Reported on 09/06/2016) 30 capsule 5  . topiramate (TOPAMAX) 100 MG tablet TAKE 1 TABLET (100 MG TOTAL) BY MOUTH 2 (TWO) TIMES DAILY. 180 tablet 1  . VENTOLIN HFA 108 (90 Base) MCG/ACT inhaler INHALE 2 PUFFS INTO THE LUNGS EVERY 6 (SIX) HOURS AS NEEDED FOR WHEEZING OR SHORTNESS OF BREATH. (Patient not taking: Reported on 09/06/2016) 18 Inhaler 3   No current facility-administered medications for this visit.    Facility-Administered Medications Ordered in Other Visits  Medication Dose Route Frequency Provider Last  Rate Last Dose  . fentaNYL (SUBLIMAZE) injection 25-50 mcg  25-50 mcg Intravenous Q5 min PRN Lerry Liner, MD        Allergies:   Penicillins; Fentanyl; Sulfonamide derivatives; and Codeine    Social History:  The patient  reports that she has never smoked. She has never used smokeless tobacco. She reports that she does not drink alcohol or use drugs.   Family History:  The patient's family history includes Arthritis in her mother; Diabetes in her brother, brother, brother, brother, mother, and sister; Heart failure in her mother; Hypertension in her brother, brother, brother, brother, mother, and sister; Kidney failure in her brother, father, and mother; Leukemia in her father; Migraines in her father, mother, and sister; Pulmonary embolism in her sister.    ROS: All other systems are reviewed and negative. Unless otherwise mentioned in H&P    PHYSICAL EXAM: VS:  There were no vitals taken for this visit. , BMI There is no height or weight on file to calculate BMI. GEN: Well nourished, well developed, in no acute distress HEENT: normal Neck: no JVD, carotid bruits, or masses Cardiac: ***RRR; no murmurs, rubs, or gallops,no edema  Respiratory:  clear to auscultation bilaterally, normal work of breathing GI: soft, nontender, nondistended, + BS MS: no deformity or atrophy Skin: warm and dry, no rash Neuro:  Strength and sensation are intact Psych: euthymic mood, full affect   EKG:  EKG {ACTION; IS/IS NAT:[id number]} ordered today. The ekg ordered today demonstrates ***   Recent Labs: 08/14/2016: ALT 17; TSH 0.981 08/15/2016: BUN 14; Creatinine, Ser 0.94; Hemoglobin 11.8; Magnesium 2.0; Platelets 321; Potassium 3.7; Sodium 140    Lipid Panel    Component Value Date/Time   CHOL 110 08/15/2016 0550   TRIG 104 08/15/2016 0550   HDL 58 08/15/2016 0550   CHOLHDL 1.9 08/15/2016 0550   VLDL 21 08/15/2016 0550   LDLCALC 31 08/15/2016 0550      Wt Readings from Last 3 Encounters:    09/06/16 223 lb 11.2 oz (101.5  kg)  08/23/16 219 lb 12 oz (99.7 kg)  08/14/16 221 lb 3.2 oz (100.3 kg)      Other studies Reviewed: Additional studies/ records that were reviewed today include: ***. Review of the above records demonstrates: ***   ASSESSMENT AND PLAN:  1.  ***   Current medicines are reviewed at length with the patient today.    Labs/ tests ordered today include: *** Phill Myron. [place] Pugh, ANP, AACC   09/16/2016 4:28 PM    [facility] [address], [place], [place] [address] Phone: [phone]; Fax: [phone]

## 2016-09-17 ENCOUNTER — Ambulatory Visit: Payer: Self-pay | Admitting: Adult Health

## 2016-09-20 DIAGNOSIS — E119 Type 2 diabetes mellitus without complications: Secondary | ICD-10-CM | POA: Diagnosis not present

## 2016-09-20 DIAGNOSIS — Z961 Presence of intraocular lens: Secondary | ICD-10-CM | POA: Diagnosis not present

## 2016-09-20 DIAGNOSIS — H26491 Other secondary cataract, right eye: Secondary | ICD-10-CM | POA: Diagnosis not present

## 2016-09-20 DIAGNOSIS — H401133 Primary open-angle glaucoma, bilateral, severe stage: Secondary | ICD-10-CM | POA: Diagnosis not present

## 2016-09-22 ENCOUNTER — Telehealth (INDEPENDENT_AMBULATORY_CARE_PROVIDER_SITE_OTHER): Payer: Self-pay | Admitting: *Deleted

## 2016-09-22 NOTE — Telephone Encounter (Signed)
[name] called, they want results of test you order  [phone]

## 2016-09-27 NOTE — Telephone Encounter (Signed)
Results have been given to patient 

## 2016-10-05 ENCOUNTER — Encounter: Payer: Self-pay | Admitting: Family Medicine

## 2016-10-05 ENCOUNTER — Ambulatory Visit (INDEPENDENT_AMBULATORY_CARE_PROVIDER_SITE_OTHER): Payer: PPO | Admitting: Cardiology

## 2016-10-05 ENCOUNTER — Ambulatory Visit (INDEPENDENT_AMBULATORY_CARE_PROVIDER_SITE_OTHER): Payer: PPO | Admitting: Family Medicine

## 2016-10-05 ENCOUNTER — Encounter: Payer: Self-pay | Admitting: Cardiology

## 2016-10-05 VITALS — BP 160/90 | HR 83 | Resp 16 | Ht 67.0 in | Wt 220.0 lb

## 2016-10-05 VITALS — BP 154/88 | HR 97 | Ht 67.0 in | Wt 222.0 lb

## 2016-10-05 DIAGNOSIS — E119 Type 2 diabetes mellitus without complications: Secondary | ICD-10-CM | POA: Diagnosis not present

## 2016-10-05 DIAGNOSIS — I10 Essential (primary) hypertension: Secondary | ICD-10-CM | POA: Diagnosis not present

## 2016-10-05 DIAGNOSIS — G8929 Other chronic pain: Secondary | ICD-10-CM | POA: Diagnosis not present

## 2016-10-05 DIAGNOSIS — R413 Other amnesia: Secondary | ICD-10-CM | POA: Diagnosis not present

## 2016-10-05 DIAGNOSIS — R102 Pelvic and perineal pain: Secondary | ICD-10-CM

## 2016-10-05 DIAGNOSIS — K219 Gastro-esophageal reflux disease without esophagitis: Secondary | ICD-10-CM | POA: Diagnosis not present

## 2016-10-05 DIAGNOSIS — Z7901 Long term (current) use of anticoagulants: Secondary | ICD-10-CM

## 2016-10-05 DIAGNOSIS — R079 Chest pain, unspecified: Secondary | ICD-10-CM | POA: Diagnosis not present

## 2016-10-05 DIAGNOSIS — E782 Mixed hyperlipidemia: Secondary | ICD-10-CM

## 2016-10-05 MED ORDER — CLONIDINE HCL 0.2 MG PO TABS: 0.2000 mg | ORAL_TABLET | Freq: Two times a day (BID) | ORAL | 2 refills | Status: DC

## 2016-10-05 MED ORDER — OXYCODONE-ACETAMINOPHEN 10-325 MG PO TABS
1.0000 | ORAL_TABLET | Freq: Two times a day (BID) | ORAL | 0 refills | Status: DC
Start: 2016-10-05 — End: 2017-01-11

## 2016-10-05 MED ORDER — OXYCODONE-ACETAMINOPHEN 10-325 MG PO TABS: 1.0000 | ORAL_TABLET | Freq: Two times a day (BID) | ORAL | 0 refills | Status: DC

## 2016-10-05 NOTE — Progress Notes (Signed)
[name]     MRN: [id number]      DOB: [birth year]   HPI [name] is here for follow up and re-evaluation of chronic medical conditions, medication management in articulare chronic pan management and review of any available recent lab and radiology data.  Preventive health is updated, specifically  Cancer screening and Immunization.   Has recent;ly d seen GI with no abnormalities determined, she has cardiology appt later today. Now complains of lower pelvic pain, says she believes she should have had a hysterectomy, due to pain in a lower abdomen, will refer for gyne evalaThe PT denies any adverse reactions to current medications since the last visit.  Sister trying hard to convince her of the need t see psychiatry for her neves, patient does not have the need at this time Not currently testing blood sugar  ROS Denies recent fever or chills. Denies sinus pressure, nasal congestion, ear pain or sore throat. Denies chest congestion, productive cough or wheezing. Denies chest pains, palpitations and leg swelling Denies  nausea, vomiting,diarrhea or constipation.   Denies dysuria, frequency, hesitancy or incontinence. C/o chronic  joint pain,  and limitation in mobility. Denies headaches, seizures, numbness, or tingling. Denies uncontrolled  depression, anxiety or insomnia. Denies skin break down or rash.   PE  BP (!) 160/90   Pulse 83   Resp 16   Ht 5\' 7"  (1.702 m)   Wt 220 lb (99.8 kg)   SpO2 94%   BMI 34.46 kg/m   Patient alert and oriented and in no cardiopulmonary distress.  HEENT: No facial asymmetry, EOMI,   oropharynx pink and moist.  Neck supple no JVD, no mass.  Chest: Clear to auscultation bilaterally.  CVS: S1, S2 no murmurs, no S3.Regular rate.  ABD: Soft non tender.   Ext: No edema  MS: Decreased  ROM spine, shoulders, hips and knees.  Skin: Intact, no ulcerations or rash noted.  Psych: Good eye contact, normal affect. Memory loss not anxious or  depressed appearing.  CNS: CN 2-12 intact, power,  normal throughout.no focal deficits noted.   Assessment & Plan  Essential hypertension Not at goal, has appt today with cardiology, no med change , was controlled at last visit DASH diet and commitment to daily physical activity for a minimum of 30 minutes discussed and encouraged, as a part of hypertension management. The importance of attaining a healthy weight is also discussed.  BP/Weight 10/05/2016 10/05/2016 09/06/2016 08/23/2016 08/15/2016 08/14/2016 3/35/4562  Systolic BP 563 893 734 287 681 - 157  Diastolic BP 90 88 80 86 78 - 80  Wt. (Lbs) 220 222 223.7 219.75 - 221.2 232  BMI 34.46 34.77 35.04 34.42 - 34.64 36.34       Dyslipidemia Hyperlipidemia:Low fat diet discussed and encouraged.   Lipid Panel  Lab Results  Component Value Date   CHOL 110 08/15/2016   HDL 58 08/15/2016   LDLCALC 31 08/15/2016   TRIG 104 08/15/2016   CHOLHDL 1.9 08/15/2016   Controlled, no change in medication    Chronic pain syndrome The patient's Controlled Substance registry is reviewed and compliance confirmed. Adequacy of  Pain control and level of function is assessed. Medication dosing is adjusted as deemed appropriate. Twelve weeks of medication is prescribed , patient signs for the script and is provided with a follow up appointment between 11 to 12 weeks .   Non-insulin dependent type 2 diabetes mellitus ([place]) Uncontrolled Updated lab needed at/ before next visit. [name]  is reminded of the importance of commitment to daily physical activity for 30 minutes or more, as able and the need to limit carbohydrate intake to 30 to 60 grams per meal to help with blood sugar control.   The need to take medication as prescribed, test blood sugar as directed, and to call between visits if there is a concern that blood sugar is uncontrolled is also discussed.   [name] is reminded of the importance of daily foot exam, annual eye examination,  and good blood sugar, blood pressure and cholesterol control.  Diabetic Labs Latest Ref Rng & Units 08/15/2016 08/14/2016 08/13/2016 07/14/2016 02/13/2016  HbA1c 4.8 - 5.6 % - 7.8(H) - 8.4(H) 7.0(H)  Microalbumin Not estab mg/dL - - - - 67.9  Micro/Creat Ratio <30 mcg/mg creat - - - - 395(H)  Chol 0 - 200 mg/dL 110 - - 116 148  HDL >40 mg/dL 58 - - 63 61  Calc LDL 0 - 99 mg/dL 31 - - 25 57  Triglycerides <150 mg/dL 104 - - 140 152(H)  Creatinine 0.44 - 1.00 mg/dL 0.94 1.10(H) 1.16(H) 1.18(H) 0.93   BP/Weight 10/05/2016 10/05/2016 09/06/2016 08/23/2016 08/15/2016 08/14/2016 7/85/8850  Systolic BP 277 412 878 676 720 - 947  Diastolic BP 90 88 80 86 78 - 80  Wt. (Lbs) 220 222 223.7 219.75 - 221.2 232  BMI 34.46 34.77 35.04 34.42 - 34.64 36.34   Foot/eye exam completion dates Latest Ref Rng & Units 07/14/2016 05/29/2015  Eye Exam No Retinopathy - -  Foot exam Order - - -  Foot Form Completion - Done Done        Pelvic pain C/o increased pelvic pain and states "I should have had a hysterectomy" refer to gyne for evaluation

## 2016-10-05 NOTE — Assessment & Plan Note (Signed)
Bilateral PE in 2012, maintained on chronic anticoagulation

## 2016-10-05 NOTE — Assessment & Plan Note (Signed)
On Aricept 

## 2016-10-05 NOTE — Assessment & Plan Note (Signed)
Recent GI evaluation by [facility]

## 2016-10-05 NOTE — Assessment & Plan Note (Signed)
Not at goal, has appt today with cardiology, no med change , was controlled at last visit DASH diet and commitment to daily physical activity for a minimum of 30 minutes discussed and encouraged, as a part of hypertension management. The importance of attaining a healthy weight is also discussed.  BP/Weight 10/05/2016 10/05/2016 09/06/2016 08/23/2016 08/15/2016 08/14/2016 9/75/3005  Systolic BP 110 211 173 567 014 - 103  Diastolic BP 90 88 80 86 78 - 80  Wt. (Lbs) 220 222 223.7 219.75 - 221.2 232  BMI 34.46 34.77 35.04 34.42 - 34.64 36.34

## 2016-10-05 NOTE — Assessment & Plan Note (Signed)
Hyperlipidemia:Low fat diet discussed and encouraged.   Lipid Panel  Lab Results  Component Value Date   CHOL 110 08/15/2016   HDL 58 08/15/2016   LDLCALC 31 08/15/2016   TRIG 104 08/15/2016   CHOLHDL 1.9 08/15/2016   Controlled, no change in medication

## 2016-10-05 NOTE — Assessment & Plan Note (Signed)
Uncontrolled Updated lab needed at/ before next visit. [name] is reminded of the importance of commitment to daily physical activity for 30 minutes or more, as able and the need to limit carbohydrate intake to 30 to 60 grams per meal to help with blood sugar control.   The need to take medication as prescribed, test blood sugar as directed, and to call between visits if there is a concern that blood sugar is uncontrolled is also discussed.   [name] is reminded of the importance of daily foot exam, annual eye examination, and good blood sugar, blood pressure and cholesterol control.  Diabetic Labs Latest Ref Rng & Units 08/15/2016 08/14/2016 08/13/2016 07/14/2016 02/13/2016  HbA1c 4.8 - 5.6 % - 7.8(H) - 8.4(H) 7.0(H)  Microalbumin Not estab mg/dL - - - - 67.9  Micro/Creat Ratio <30 mcg/mg creat - - - - 395(H)  Chol 0 - 200 mg/dL 110 - - 116 148  HDL >40 mg/dL 58 - - 63 61  Calc LDL 0 - 99 mg/dL 31 - - 25 57  Triglycerides <150 mg/dL 104 - - 140 152(H)  Creatinine 0.44 - 1.00 mg/dL 0.94 1.10(H) 1.16(H) 1.18(H) 0.93   BP/Weight 10/05/2016 10/05/2016 09/06/2016 08/23/2016 08/15/2016 08/14/2016 9/41/7408  Systolic BP 144 818 563 149 702 - 637  Diastolic BP 90 88 80 86 78 - 80  Wt. (Lbs) 220 222 223.7 219.75 - 221.2 232  BMI 34.46 34.77 35.04 34.42 - 34.64 36.34   Foot/eye exam completion dates Latest Ref Rng & Units 07/14/2016 05/29/2015  Eye Exam No Retinopathy - -  Foot exam Order - - -  Foot Form Completion - Done Done

## 2016-10-05 NOTE — Patient Instructions (Addendum)
F/U in 12 weeks, call if you need me sooner  Fasting lipid, cmp and EGFr, and hBA1C  October 1 or after  You are referred to Female gynecologist in [place] to evaluate pelvic pain   No change in pain management  Thank you  for choosing [facility]. We consider it a privelige to serve you.  Delivering excellent health care in a caring and  compassionate way is our goal.  Partnering with you,  so that together we can achieve this goal is our strategy.

## 2016-10-05 NOTE — Assessment & Plan Note (Signed)
C/o increased pelvic pain and states "I should have had a hysterectomy" refer to gyne for evaluation

## 2016-10-05 NOTE — Assessment & Plan Note (Signed)
Pt referred for evaluation of chest pain-multiple cardiac risk factors including DM

## 2016-10-05 NOTE — Assessment & Plan Note (Signed)
LDL was 31 in July 2018 on Lipitor 40 mg

## 2016-10-05 NOTE — Assessment & Plan Note (Signed)
The patient's Controlled Substance registry is reviewed and compliance confirmed. Adequacy of  Pain control and level of function is assessed. Medication dosing is adjusted as deemed appropriate. Twelve weeks of medication is prescribed , patient signs for the script and is provided with a follow up appointment between 11 to 12 weeks .  

## 2016-10-05 NOTE — Assessment & Plan Note (Signed)
Controlled.  

## 2016-10-05 NOTE — Progress Notes (Signed)
10/05/2016 [name] [name]   09-Oct-1940  [id number]  Primary Physician [name] Helper, MD Primary Cardiologist: Dr [name]  HPI:  76 y/o obese AA female, last seen by [place] June 2016 with a history of HTN, DM, HLD, PE in 2012, chronic anticoagulation, and chest pain. Myioview in 2007 and in 2012 were low risk. 2D echo in 2012 showed normal LVF and moderate LVH. She was recently admitted with chest, epigastric pain 6/29-08/15/16. A GI work up was done as an OP. Barium swallow was done as an OP and the pt was told no significant findings to explain her symptoms. She describes epigastric and throat pain when swallowing. She denies chest "tightness or pressure".   Current Outpatient Prescriptions  Medication Sig Dispense Refill  . ACCU-CHEK FASTCLIX LANCETS MISC Once daily testing dx e11.9 50 each 5  . acyclovir (ZOVIRAX) 400 MG tablet TAKE 1 TABLET (400 MG TOTAL) BY MOUTH 3 (THREE) TIMES DAILY. 30 tablet 5  . amLODipine (NORVASC) 10 MG tablet TAKE 1 TABLET BY MOUTH EVERY DAY 90 tablet 0  . atorvastatin (LIPITOR) 40 MG tablet TAKE 1 TABLET (40 MG TOTAL) BY MOUTH DAILY. 90 tablet 1  . Blood Glucose Monitoring Suppl (ACCU-CHEK AVIVA PLUS) w/Device KIT For once daily testing dx E11.9 1 kit 0  . busPIRone (BUSPAR) 7.5 MG tablet Take 7.5 mg by mouth 3 (three) times daily.    . cloNIDine (CATAPRES) 0.2 MG tablet Take 1 tablet (0.2 mg total) by mouth 2 (two) times daily. 180 tablet 2  . cyclobenzaprine (FLEXERIL) 10 MG tablet Take 1 tablet (10 mg total) by mouth 2 (two) times daily.    Marland Kitchen donepezil (ARICEPT) 10 MG tablet TAKE 1 TABLET AT BEDTIME 90 tablet 1  . ergocalciferol (VITAMIN D2) 50000 units capsule Take 1 capsule (50,000 Units total) by mouth once a week. One capsule once weekly 12 capsule 1  . gabapentin (NEURONTIN) 300 MG capsule Take 300 mg by mouth at bedtime.    Marland Kitchen glucose blood (ACCU-CHEK AVIVA) test strip Use as instructed one daily testing dx e11.9 50 each 5  . hydrOXYzine  (ATARAX/VISTARIL) 25 MG tablet Take 0.5 tablets (12.5 mg total) by mouth 3 (three) times daily as needed.    Marland Kitchen imipramine (TOFRANIL) 50 MG tablet TAKE 2 TABLETS BY MOUTH AT BEDTIME 180 tablet 0  . linagliptin (TRADJENTA) 5 MG TABS tablet Take 1 tablet (5 mg total) by mouth daily. 30 tablet 4  . meclizine (ANTIVERT) 25 MG tablet TAKE 1 TABLET THREE TIMES DAILY AS NEEDED FOR DIZZINESS. 270 tablet 1  . metFORMIN (GLUCOPHAGE) 500 MG tablet TAKE 1 TABLET TWICE DAILY WITH MEALS 180 tablet 1  . montelukast (SINGULAIR) 10 MG tablet TAKE 1 TABLET (10 MG TOTAL) BY MOUTH AT BEDTIME. (Patient taking differently: TAKE 1 TABLET (10 MG TOTAL) BY MOUTH AT BEDTIME AS NEEDED) 90 tablet 1  . omeprazole (PRILOSEC) 40 MG capsule Take 1 capsule (40 mg total) by mouth 2 (two) times daily. 60 capsule 1  . ondansetron (ZOFRAN) 4 MG tablet Take 4 mg by mouth 2 (two) times daily as needed for nausea or vomiting.    Marland Kitchen oxyCODONE-acetaminophen (PERCOCET) 10-325 MG tablet Take 1 tablet by mouth 2 (two) times daily. 60 tablet 0  . PRADAXA 150 MG CAPS capsule TAKE 1 CAPSULE EVERY 12 HOURS 180 capsule 3  . propranolol (INDERAL) 80 MG tablet TAKE 1 TABLET BY MOUTH EVERY DAY 90 tablet 1  . temazepam (RESTORIL) 30 MG capsule Take 1  capsule (30 mg total) by mouth at bedtime as needed for sleep. 30 capsule 5  . topiramate (TOPAMAX) 100 MG tablet TAKE 1 TABLET (100 MG TOTAL) BY MOUTH 2 (TWO) TIMES DAILY. 180 tablet 1  . VENTOLIN HFA 108 (90 Base) MCG/ACT inhaler INHALE 2 PUFFS INTO THE LUNGS EVERY 6 (SIX) HOURS AS NEEDED FOR WHEEZING OR SHORTNESS OF BREATH. 18 Inhaler 3   No current facility-administered medications for this visit.    Facility-Administered Medications Ordered in Other Visits  Medication Dose Route Frequency Provider Last Rate Last Dose  . fentaNYL (SUBLIMAZE) injection 25-50 mcg  25-50 mcg Intravenous Q5 min PRN [name] Liner, MD        Allergies-PNC, Sulfa, Codiene, Fentanyl  Past Medical History:  Diagnosis  Date  . Anemia, iron deficiency 2012   Evaluated by Dr. Oneida [name]; H&H of 9.3/30.8 with [place] in 10/2010; 3/3 positive Hemoccult cards in 07/2011  . Anxiety    was on Prozac for a couple of weeks  . Atrial fibrillation ([place])    takes Coumadin daily  . Bruises easily    takes Coumadin  . Chronic anticoagulation 07/21/2010  . Chronic back pain    related to knee pain  . Degenerative joint disease    Knees  . Depression   . Diabetes mellitus    takes Metformin daily  . Gastroesophageal reflux disease    takes Omeprazole daily  . Glaucoma   . Glaucoma    uses eye drops at night  . History of blood transfusion 1969  . History of gout    was on medication but taken off of several months ago  . Hx of colonic polyps   . Hx of migraines    last one about a yr ago;takes Topamax daily  . Hyperlipidemia    takes Pravastatin daily  . Hypertension    takes Hyzaar daily and Propranolol   . Insomnia    takes Restoril prn  . Joint pain   . Joint swelling   . Memory loss    takes donepezil  . Migraine   . Migraine   . Nocturia   . Overweight(278.02)   . Pneumonia 1969   hx of  . Pulmonary embolism [name]) May 2012   acute presentation, bilateral PE  . Skin spots-aging   . Urinary frequency     Social History   Social History  . Marital status: Widowed    Spouse name: N/A  . Number of children: 2  . Years of education: 11   Occupational History  . 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 Smoking status: Never Smoker  . Smokeless tobacco: Never Used  . Alcohol use No  . Drug use: No  . Sexual activity: Not Currently    Birth control/ protection: Post-menopausal   Other Topics Concern  . Not on file   Social History Narrative   Lives at home with husband.   Right-handed.   1 cup caffeine per day.     Family History  Problem Relation Age of Onset  . Diabetes Mother   . Hypertension Mother   . Arthritis Mother   . Heart failure  Mother   . Migraines Mother   . Kidney failure Mother   . Leukemia Father   . Migraines Father   . Kidney failure Father   . Diabetes Sister   . Hypertension Sister   . Diabetes Brother   .  Hypertension Brother   . Hypertension Brother   . Hypertension Brother   . Hypertension Brother   . Diabetes Brother   . Diabetes Brother   . Diabetes Brother   . Pulmonary embolism Sister   . Migraines Sister   . Kidney failure Brother   . Colon cancer Neg Hx   . Colon polyps Neg Hx      Review of Systems: General: negative for chills, fever, night sweats or weight changes.  Cardiovascular: negative for chest pain, dyspnea on exertion, edema, orthopnea, palpitations, paroxysmal nocturnal dyspnea or shortness of breath Dermatological: negative for rash Respiratory: negative for cough or wheezing Urologic: negative for hematuria Abdominal: negative for nausea, vomiting, diarrhea, bright red blood per rectum, melena, or hematemesis Neurologic: negative for visual changes, syncope, or dizziness All other systems reviewed and are otherwise negative except as noted above.    Blood pressure (!) 154/88, pulse 97, height 5' 7"  (1.702 m), weight 222 lb (100.7 kg), SpO2 95 %.  General appearance: alert, cooperative, no distress and moderately obese Neck: no carotid bruit and no JVD Lungs: clear to auscultation bilaterally Heart: regular rate and rhythm Abdomen: obese, non tender Extremities: trace edema Pulses: 2+ and symmetric Skin: Skin color, texture, turgor normal. No rashes or lesions Neurologic: Grossly normal  EKG NSR, LVH  ASSESSMENT AND PLAN:   Chest pain, unspecified Pt referred for evaluation of chest pain-multiple cardiac risk factors including DM  Chronic anticoagulation Bilateral PE in 2012, maintained on chronic anticoagulation  Dyslipidemia LDL was 31 in July 2018 on Lipitor 40 mg  Essential hypertension Controlled  Gastroesophageal reflux disease Recent GI  evaluation by [facility]  Non-insulin dependent type 2 diabetes mellitus ([place]) On oral agents  Memory loss On Aricept   PLAN  I discussed Ms Blankley's case with Dr Bronson Ing who was in the office today. He suggested we proceed with a Lexiscan Myoview.   Kerin Ransom PA-C 10/05/2016 4:11 PM

## 2016-10-05 NOTE — Patient Instructions (Addendum)
Medication Instructions:  Your physician recommends that you continue on your current medications as directed. Please refer to the Current Medication list given to you today.   Labwork: NONE  Testing/Procedures: Your physician has requested that you have a lexiscan myoview. For further information please visit [email or web address]. Please follow instruction sheet, as given.    Follow-Up: Your physician wants you to follow-up in: 6 MONTHS.  You will receive a reminder letter in the mail two months in advance. If you don't receive a letter, please call our office to schedule the follow-up appointment.    Any Other Special Instructions Will Be Listed Below (If Applicable).     If you need a refill on your cardiac medications before your next appointment, please call your pharmacy.   

## 2016-10-05 NOTE — Assessment & Plan Note (Signed)
On oral agents 

## 2016-10-06 ENCOUNTER — Ambulatory Visit: Payer: Self-pay | Admitting: Family Medicine

## 2016-10-08 ENCOUNTER — Encounter (INDEPENDENT_AMBULATORY_CARE_PROVIDER_SITE_OTHER): Payer: Self-pay | Admitting: Internal Medicine

## 2016-10-08 NOTE — Progress Notes (Signed)
An appointment was given for 12/28/46 at 10:45am w/Terri Vaughan Basta, NP.  A letter was mailed to the patient.

## 2016-10-09 ENCOUNTER — Other Ambulatory Visit: Payer: Self-pay | Admitting: Cardiology

## 2016-10-10 ENCOUNTER — Other Ambulatory Visit: Payer: Self-pay | Admitting: Family Medicine

## 2016-10-14 ENCOUNTER — Telehealth: Payer: Self-pay

## 2016-10-14 NOTE — Telephone Encounter (Signed)
Called pt to schedule Medicare Annual Wellness Visit. -nr  

## 2016-10-19 ENCOUNTER — Encounter (HOSPITAL_COMMUNITY): Payer: Self-pay

## 2016-10-19 ENCOUNTER — Encounter (HOSPITAL_COMMUNITY)
Admission: RE | Admit: 2016-10-19 | Discharge: 2016-10-19 | Disposition: A | Discharge status: Home / Self Care | Payer: PPO | Source: Ambulatory Visit | Attending: Cardiology | Admitting: Cardiology

## 2016-10-19 ENCOUNTER — Inpatient Hospital Stay (HOSPITAL_COMMUNITY): Admission: RE | Admit: 2016-10-19 | Payer: Self-pay | Source: Ambulatory Visit

## 2016-10-19 DIAGNOSIS — R079 Chest pain, unspecified: Secondary | ICD-10-CM | POA: Diagnosis not present

## 2016-10-19 LAB — NM MYOCAR MULTI W/SPECT W/WALL MOTION / EF
LV dias vol: 56 mL (ref 46–106)
LV sys vol: 14 mL
Peak HR: 81 {beats}/min
RATE: 0.56
Rest HR: 71 {beats}/min
SDS: 2
SRS: 2
SSS: 4
TID: 1.36

## 2016-10-19 MED ORDER — REGADENOSON 0.4 MG/5ML IV SOLN
INTRAVENOUS | Status: AC
  Administered 2016-10-19: 0.4 mg via INTRAVENOUS
  Filled 2016-10-19: qty 5

## 2016-10-19 MED ORDER — TECHNETIUM TC 99M TETROFOSMIN IV KIT
30.0000 | PACK | Freq: Once | INTRAVENOUS | Status: AC | PRN
Start: 2016-10-19 — End: 2016-10-19
  Administered 2016-10-19: 30 via INTRAVENOUS

## 2016-10-19 MED ORDER — TECHNETIUM TC 99M TETROFOSMIN IV KIT
10.0000 | PACK | Freq: Once | INTRAVENOUS | Status: AC | PRN
  Administered 2016-10-19: 10 via INTRAVENOUS

## 2016-10-19 MED ORDER — SODIUM CHLORIDE 0.9% FLUSH
INTRAVENOUS | Status: AC
  Administered 2016-10-19: 10 mL via INTRAVENOUS
  Filled 2016-10-19: qty 10

## 2016-11-03 ENCOUNTER — Other Ambulatory Visit: Payer: Self-pay | Admitting: Family Medicine

## 2016-11-09 ENCOUNTER — Other Ambulatory Visit: Payer: Self-pay | Admitting: Family Medicine

## 2016-11-17 ENCOUNTER — Ambulatory Visit (INDEPENDENT_AMBULATORY_CARE_PROVIDER_SITE_OTHER): Payer: PPO

## 2016-11-17 DIAGNOSIS — I10 Essential (primary) hypertension: Secondary | ICD-10-CM | POA: Diagnosis not present

## 2016-11-17 DIAGNOSIS — R309 Painful micturition, unspecified: Secondary | ICD-10-CM | POA: Diagnosis not present

## 2016-11-17 DIAGNOSIS — E782 Mixed hyperlipidemia: Secondary | ICD-10-CM | POA: Diagnosis not present

## 2016-11-17 DIAGNOSIS — E119 Type 2 diabetes mellitus without complications: Secondary | ICD-10-CM | POA: Diagnosis not present

## 2016-11-17 LAB — POCT URINALYSIS DIPSTICK
GLUCOSE UA: 250
KETONES UA: 15
Nitrite, UA: POSITIVE
Protein, UA: >=300
SPEC GRAV UA: 1.01 (ref 1.010–1.025)
Urobilinogen, UA: >=8 E.U./dL — AB
pH, UA: 5 (ref 5.0–8.0)

## 2016-11-18 ENCOUNTER — Other Ambulatory Visit: Payer: Self-pay | Admitting: Family Medicine

## 2016-11-18 ENCOUNTER — Telehealth: Payer: Self-pay | Admitting: Family Medicine

## 2016-11-18 LAB — HEMOGLOBIN A1C
EAG (MMOL/L): 10.1 (calc)
Hgb A1c MFr Bld: 8 % of total Hgb — ABNORMAL HIGH (ref <5.7)
MEAN PLASMA GLUCOSE: 183 (calc)

## 2016-11-18 LAB — COMPLETE METABOLIC PANEL WITH GFR
AG RATIO: 1.2 (calc) (ref 1.0–2.5)
ALBUMIN MSPROF: 3.8 g/dL (ref 3.6–5.1)
ALT: 15 U/L (ref 6–29)
AST: 12 U/L (ref 10–35)
Alkaline phosphatase (APISO): 111 U/L (ref 33–130)
BILIRUBIN TOTAL: 0.2 mg/dL (ref 0.2–1.2)
BUN / CREAT RATIO: 14 (calc) (ref 6–22)
BUN: 15 mg/dL (ref 7–25)
CALCIUM: 9.3 mg/dL (ref 8.6–10.4)
CHLORIDE: 106 mmol/L (ref 98–110)
CO2: 25 mmol/L (ref 20–32)
Creat: 1.05 mg/dL — ABNORMAL HIGH (ref 0.60–0.93)
GFR, EST AFRICAN AMERICAN: 59 mL/min/{1.73_m2} — AB (ref >=60)
GFR, EST NON AFRICAN AMERICAN: 51 mL/min/{1.73_m2} — AB (ref >=60)
GLOBULIN: 3.2 g/dL (ref 1.9–3.7)
Glucose, Bld: 166 mg/dL — ABNORMAL HIGH (ref 65–99)
POTASSIUM: 3.8 mmol/L (ref 3.5–5.3)
SODIUM: 142 mmol/L (ref 135–146)
TOTAL PROTEIN: 7 g/dL (ref 6.1–8.1)

## 2016-11-18 LAB — LIPID PANEL
CHOLESTEROL: 129 mg/dL (ref <200)
HDL: 67 mg/dL (ref >50)
LDL Cholesterol (Calc): 44 mg/dL (calc)
NON-HDL CHOLESTEROL (CALC): 62 mg/dL (ref <130)
TRIGLYCERIDES: 92 mg/dL (ref <150)
Total CHOL/HDL Ratio: 1.9 (calc) (ref <5.0)

## 2016-11-18 MED ORDER — CIPROFLOXACIN HCL 500 MG PO TABS: 500.0000 mg | ORAL_TABLET | Freq: Two times a day (BID) | ORAL | 0 refills | Status: DC

## 2016-11-18 NOTE — Telephone Encounter (Signed)
Called patient per Dr. Moshe Cipro- to inform that cipro has been sent to pharm for UTI and that diabetes test was high-needs to work on diet. Unable to reach patient of sister, [name], unable to leave message.

## 2016-11-20 LAB — URINE CULTURE
MICRO NUMBER: 81100038
SPECIMEN QUALITY:: ADEQUATE

## 2016-11-23 ENCOUNTER — Other Ambulatory Visit: Payer: Self-pay | Admitting: Family Medicine

## 2016-11-23 MED ORDER — NITROFURANTOIN MONOHYD MACRO 100 MG PO CAPS: 100.0000 mg | ORAL_CAPSULE | Freq: Two times a day (BID) | ORAL | 0 refills | Status: DC

## 2016-12-10 ENCOUNTER — Ambulatory Visit (INDEPENDENT_AMBULATORY_CARE_PROVIDER_SITE_OTHER): Payer: PPO | Admitting: Family Medicine

## 2016-12-10 ENCOUNTER — Encounter: Payer: Self-pay | Admitting: Family Medicine

## 2016-12-10 VITALS — BP 130/86 | HR 95 | Temp 97.5°F | Resp 16 | Wt 218.0 lb

## 2016-12-10 DIAGNOSIS — K1370 Unspecified lesions of oral mucosa: Secondary | ICD-10-CM

## 2016-12-10 NOTE — Patient Instructions (Signed)
Use Anbesol, Orajel or Orabase topical analgesic for sore in mouth. Dr. Benjamine Mola will see you on Monday 12/13/16 at 1 pm.

## 2016-12-10 NOTE — Progress Notes (Signed)
Patient ID: [name], female    DOB: [birth year], 76 y.o.   MRN: [id number]  Chief Complaint  Patient presents with  . Mouth Lesions    Allergies Penicillins; Fentanyl; Sulfonamide derivatives; and Codeine  Subjective:   [name] is a 76 y.o. female who presents to [facility] today.  HPI Patient presents today complaining of a lesion in her mouth that has been bothering her for over a week. Initially she reports that it is only been there for a week but then her family member reports that she has been complaining about it for some time. She reports that she is unable to wear her upper dentures because of the place in her mouth. Reports that the area does not bleed but is sore. She does not smoke. Family member is not sure how long it's been there but reports she has been complaining about it on and off for some time. Patient is able to eat soft foods without her dentures.   Mouth Lesions   The current episode started more than 1 week ago. The onset is undetermined. The problem occurs rarely. The problem has been gradually worsening. The problem is moderate. Nothing relieves the symptoms. Nothing aggravates the symptoms. Associated symptoms include mouth sores. Pertinent negatives include no fever, no nausea, no vomiting, no sore throat, no stridor, no swollen glands, no neck pain, no cough and no rash. She has been behaving normally. She has been eating and drinking normally. There were no sick contacts. She has received no recent medical care.    Past Medical History:  Diagnosis Date  . Anemia, iron deficiency 2012   Evaluated by Dr. Oneida Alar; H&H of 9.3/30.8 with [place] in 10/2010; 3/3 positive Hemoccult cards in 07/2011  . Anxiety    was on Prozac for a couple of weeks  . Atrial fibrillation ([place])    takes Coumadin daily  . Bruises easily    takes Coumadin  . Chronic anticoagulation 07/21/2010  . Chronic back pain    related to knee pain  . Degenerative  joint disease    Knees  . Depression   . Diabetes mellitus    takes Metformin daily  . Gastroesophageal reflux disease    takes Omeprazole daily  . Glaucoma   . Glaucoma    uses eye drops at night  . History of blood transfusion 1969  . History of gout    was on medication but taken off of several months ago  . Hx of colonic polyps   . Hx of migraines    last one about a yr ago;takes Topamax daily  . Hyperlipidemia    takes Pravastatin daily  . Hypertension    takes Hyzaar daily and Propranolol   . Insomnia    takes Restoril prn  . Joint pain   . Joint swelling   . Memory loss    takes donepezil  . Migraine   . Migraine   . Nocturia   . Overweight(278.02)   . Pneumonia 1969   hx of  . Pulmonary embolism [facility]) May 2012   acute presentation, bilateral PE  . Skin spots-aging   . Urinary frequency     Past Surgical History:  Procedure Laterality Date  . CATARACT EXTRACTION W/PHACO Right 04/11/2012   Procedure: CATARACT EXTRACTION PHACO AND INTRAOCULAR LENS PLACEMENT (IOC);  Surgeon: Elta Guadeloupe T. Gershon Crane, MD;  Location: [facility] ORS;  Service: Ophthalmology;  Laterality: Right;  CDE=9.61  . CATARACT EXTRACTION W/PHACO Left  12/26/2012   Procedure: CATARACT EXTRACTION PHACO AND INTRAOCULAR LENS PLACEMENT (IOC);  Surgeon: Elta Guadeloupe T. Gershon Crane, MD;  Location: [facility];  Service: Ophthalmology;  Laterality: Left;  CDE:7.74  . COLONOSCOPY  Jan 2002; 2012   2002: Dr. Tamala Julian, ext. hemorrhoids, nl colon; 2012-adenomatous polyps, gastritis on EGD  . DILATION AND CURETTAGE OF UTERUS    . ESOPHAGOGASTRODUODENOSCOPY  08/24/2011   ESOPHAGOGASTRODUODENOSCOPY; esophageal dilatation; Rogene Houston, MD;  . Freda Munro CAPSULE STUDY  08/18/2011   Procedure: GIVENS CAPSULE STUDY;  Surgeon: Rogene Houston, MD;  Location: [facility] SUITE;  Service: Endoscopy;  Laterality: N/A;  730  . KNEE ARTHROSCOPY W/ MENISCECTOMY  90's   Left  . ORIF ANKLE FRACTURE Right 90's  . TONSILLECTOMY    . TOTAL KNEE ARTHROPLASTY   10/20/2011   Procedure: TOTAL KNEE ARTHROPLASTY;  Surgeon: Ninetta Lights, MD;  Location: [facility];  Service: Orthopedics;  Laterality: Left;  . UPPER GASTROINTESTINAL ENDOSCOPY    . URETHRAL DILATION      Family History  Problem Relation Age of Onset  . Diabetes Mother   . Hypertension Mother   . Arthritis Mother   . Heart failure Mother   . Migraines Mother   . Kidney failure Mother   . Leukemia Father   . Migraines Father   . Kidney failure Father   . Diabetes Sister   . Hypertension Sister   . Diabetes Brother   . Hypertension Brother   . Hypertension Brother   . Hypertension Brother   . Hypertension Brother   . Diabetes Brother   . Diabetes Brother   . Diabetes Brother   . Pulmonary embolism Sister   . Migraines Sister   . Kidney failure Brother   . Colon cancer Neg Hx   . Colon polyps Neg Hx      Social History   Social History  . Marital status: Widowed    Spouse name: N/A  . Number of children: 2  . Years of education: 11   Occupational History  . 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 Smoking status: Never Smoker  . Smokeless tobacco: Never Used  . Alcohol use No  . Drug use: No  . Sexual activity: Not Currently    Birth control/ protection: Post-menopausal   Other Topics Concern  . None   Social History Narrative   Lives at home with husband.   Right-handed.   1 cup caffeine per day.    Review of Systems  Constitutional: Negative for appetite change, chills, diaphoresis and fever.  HENT: Positive for mouth sores. Negative for sore throat and trouble swallowing.   Respiratory: Negative for cough, choking, chest tightness and stridor.   Cardiovascular: Negative for chest pain.  Gastrointestinal: Negative for nausea and vomiting.  Musculoskeletal: Negative for neck pain.  Skin: Negative for rash.  Hematological: Negative for adenopathy.     Objective:   BP 130/86 (BP Location: Left Arm, Patient  Position: Sitting, Cuff Size: Normal)   Pulse 95   Temp (!) 97.5 F (36.4 C) (Other (Comment))   Resp 16   Wt 218 lb (98.9 kg)   SpO2 97%   BMI 34.14 kg/m   Physical Exam  Constitutional: She appears well-nourished. No distress.  HENT:  Head: Normocephalic and atraumatic.  Nose: Nose normal.  Mouth/Throat: Mucous membranes are normal. She has dentures. Oral lesions present. No uvula swelling or lacerations. No oropharyngeal  exudate, posterior oropharyngeal edema or posterior oropharyngeal erythema.  0.5 cm elevated oval lesion, pink/white coloration, TTP, not friable; located on upper lip, dental surface. No other lesions in OC appreciated.   Neck: Normal range of motion. Neck supple.  Cardiovascular: Normal rate.   Pulmonary/Chest: Effort normal and breath sounds normal.  Skin: Skin is warm and dry.  Psychiatric: She has a normal mood and affect. Her behavior is normal.  Vitals reviewed.    Assessment and Plan   1. Mouth lesion Uncertain etiology of lesion. However patient needs to have this biopsied/removed by ENT. Called Dr. Deeann Saint office and patient was scheduled for an appointment on Monday, October 29 at 1:00 PM. Patient and family member present were informed of this appointment verbally and in writing. Appointment was made by myself. Counseled patient that she should try to avoid wearing dentures because it is probably going to aggravate the lesion. Told that she could use Orajel or Orabase over-the-counter as needed for relief. Patient was counseled regarding worrisome signs and symptoms and told to call or return to office if develop. Patient was told to keep her regular scheduled visit with Dr. Moshe Cipro  Return as directed by PCP. Caren Macadam, MD 12/10/2016

## 2016-12-13 ENCOUNTER — Ambulatory Visit (INDEPENDENT_AMBULATORY_CARE_PROVIDER_SITE_OTHER): Payer: PPO | Admitting: Otolaryngology

## 2016-12-13 DIAGNOSIS — D3701 Neoplasm of uncertain behavior of lip: Secondary | ICD-10-CM

## 2016-12-15 ENCOUNTER — Other Ambulatory Visit (INDEPENDENT_AMBULATORY_CARE_PROVIDER_SITE_OTHER): Payer: Self-pay | Admitting: Otolaryngology

## 2016-12-15 DIAGNOSIS — D1801 Hemangioma of skin and subcutaneous tissue: Secondary | ICD-10-CM | POA: Diagnosis not present

## 2016-12-15 DIAGNOSIS — D3701 Neoplasm of uncertain behavior of lip: Secondary | ICD-10-CM | POA: Diagnosis not present

## 2016-12-23 ENCOUNTER — Ambulatory Visit (INDEPENDENT_AMBULATORY_CARE_PROVIDER_SITE_OTHER): Payer: PPO | Admitting: Otolaryngology

## 2016-12-27 ENCOUNTER — Ambulatory Visit (INDEPENDENT_AMBULATORY_CARE_PROVIDER_SITE_OTHER): Payer: Self-pay | Admitting: Internal Medicine

## 2016-12-27 ENCOUNTER — Encounter (INDEPENDENT_AMBULATORY_CARE_PROVIDER_SITE_OTHER): Payer: Self-pay | Admitting: Internal Medicine

## 2016-12-28 ENCOUNTER — Ambulatory Visit: Payer: Self-pay | Admitting: Family Medicine

## 2017-01-11 ENCOUNTER — Ambulatory Visit: Payer: PPO | Admitting: Family Medicine

## 2017-01-11 ENCOUNTER — Other Ambulatory Visit: Payer: Self-pay

## 2017-01-11 ENCOUNTER — Encounter: Payer: Self-pay | Admitting: Family Medicine

## 2017-01-11 VITALS — BP 130/84 | HR 83 | Resp 16 | Ht 67.0 in | Wt 224.0 lb

## 2017-01-11 DIAGNOSIS — R0789 Other chest pain: Secondary | ICD-10-CM | POA: Diagnosis not present

## 2017-01-11 DIAGNOSIS — I10 Essential (primary) hypertension: Secondary | ICD-10-CM

## 2017-01-11 DIAGNOSIS — R413 Other amnesia: Secondary | ICD-10-CM | POA: Diagnosis not present

## 2017-01-11 DIAGNOSIS — Z23 Encounter for immunization: Secondary | ICD-10-CM | POA: Diagnosis not present

## 2017-01-11 DIAGNOSIS — E8881 Metabolic syndrome: Secondary | ICD-10-CM | POA: Diagnosis not present

## 2017-01-11 DIAGNOSIS — R102 Pelvic and perineal pain: Secondary | ICD-10-CM

## 2017-01-11 DIAGNOSIS — G8929 Other chronic pain: Secondary | ICD-10-CM

## 2017-01-11 DIAGNOSIS — L700 Acne vulgaris: Secondary | ICD-10-CM

## 2017-01-11 DIAGNOSIS — Z1231 Encounter for screening mammogram for malignant neoplasm of breast: Secondary | ICD-10-CM | POA: Diagnosis not present

## 2017-01-11 DIAGNOSIS — E119 Type 2 diabetes mellitus without complications: Secondary | ICD-10-CM | POA: Diagnosis not present

## 2017-01-11 MED ORDER — BLOOD GLUCOSE METER KIT: PACK | 0 refills | Status: DC

## 2017-01-11 MED ORDER — LINAGLIPTIN 5 MG PO TABS: 5.0000 mg | ORAL_TABLET | Freq: Every day | ORAL | 4 refills | Status: DC

## 2017-01-11 MED ORDER — METFORMIN HCL 500 MG PO TABS: 500.0000 mg | ORAL_TABLET | Freq: Two times a day (BID) | ORAL | 1 refills | Status: DC

## 2017-01-11 MED ORDER — MECLIZINE HCL 25 MG PO TABS: ORAL_TABLET | ORAL | 1 refills | Status: DC

## 2017-01-11 MED ORDER — AMLODIPINE BESYLATE 10 MG PO TABS: 10.0000 mg | ORAL_TABLET | Freq: Every day | ORAL | 1 refills | Status: DC

## 2017-01-11 MED ORDER — OXYCODONE-ACETAMINOPHEN 10-325 MG PO TABS: 1.0000 | ORAL_TABLET | Freq: Two times a day (BID) | ORAL | 0 refills | Status: DC

## 2017-01-11 MED ORDER — TOPIRAMATE 100 MG PO TABS: 100.0000 mg | ORAL_TABLET | Freq: Two times a day (BID) | ORAL | 1 refills | Status: DC

## 2017-01-11 MED ORDER — OXYCODONE-ACETAMINOPHEN 10-325 MG PO TABS
1.0000 | ORAL_TABLET | Freq: Two times a day (BID) | ORAL | 0 refills | Status: DC
Start: 2017-01-11 — End: 2017-01-11

## 2017-01-11 MED ORDER — DONEPEZIL HCL 10 MG PO TABS: 10.0000 mg | ORAL_TABLET | Freq: Every day | ORAL | 1 refills | Status: DC

## 2017-01-11 MED ORDER — OMEPRAZOLE 40 MG PO CPDR: 40.0000 mg | DELAYED_RELEASE_CAPSULE | Freq: Two times a day (BID) | ORAL | 1 refills | Status: DC

## 2017-01-11 MED ORDER — IMIPRAMINE HCL 50 MG PO TABS: 100.0000 mg | ORAL_TABLET | Freq: Every day | ORAL | 1 refills | Status: DC

## 2017-01-11 NOTE — Patient Instructions (Addendum)
Annual wellness with nurse in December or January  MD follow up fopr pain management in 12 weeks  Improved blood sugar  Pls sched mammogram appointment at checkout  Careful not to fall  Use ointment given today to blackhead when it comes up on your nose  Thank you  for choosing [facility]. We consider it a privelige to serve you.  Delivering excellent health care in a caring and  compassionate way is our goal.  Partnering with you,  so that together we can achieve this goal is our strategy.   Flu vaccine today

## 2017-01-16 ENCOUNTER — Encounter: Payer: Self-pay | Admitting: Family Medicine

## 2017-01-16 DIAGNOSIS — L709 Acne, unspecified: Secondary | ICD-10-CM | POA: Insufficient documentation

## 2017-01-16 NOTE — Assessment & Plan Note (Addendum)
Deteriorated. Patient re-educated about  the importance of commitment to regular exercise. The importance of healthy food choices with portion control discussed. Encouraged to start a food diary, count calories and to consider  joining a support group. Sample diet sheets offered. Goals set by the patient for the next several months.   Weight /BMI 01/11/2017 12/10/2016 10/05/2016  WEIGHT 224 lb 218 lb 220 lb  HEIGHT 5\' 7"  - 5\' 7"   BMI 35.08 kg/m2 34.14 kg/m2 34.46 kg/m2

## 2017-01-16 NOTE — Assessment & Plan Note (Signed)
Need to f/u on recent cardiology tests

## 2017-01-16 NOTE — Progress Notes (Signed)
[name]     MRN: [id number]      DOB: [birth year]   HPI [name] is here for follow up and re-evaluation of chronic medical conditions, medication management and review of any available recent lab and radiology data.  Preventive health is updated, specifically  Cancer screening and Immunization.  Needs mammogram and flu vaccinme Questions or concerns regarding consultations or procedures which the PT has had in the interim are  Addressed.Still has outstanding gyne appointment, and has not heard from cardiology evaluation Will need to f/u on this as record review is non revealing The PT denies any adverse reactions to current medications since the last visit.  Denies polyuria, polydipsia, blurred vision , or hypoglycemic episodes. C/o recurring blackhead on her nose which she wants treated, states she does not want to go to [place] with the blackhead ROS Denies recent fever or chills. Denies sinus pressure, nasal congestion, ear pain or sore throat. Denies chest congestion, productive cough or wheezing. Denies chest pains, palpitations and leg swelling Denies abdominal pain, nausea, vomiting,diarrhea or constipation.   Denies dysuria, frequency, hesitancy or incontinence. C/o  joint pain,  and limitation in mobility. Denies headaches, seizures, numbness, or tingling. Denies uncontrolled  depression, anxiety or insomnia.Her sister who accompanies believes that she is still very depressed and would benefit from treatment howveer   PE  BP 130/84   Pulse 83   Resp 16   Ht 5\' 7"  (1.702 m)   Wt 224 lb (101.6 kg)   SpO2 95%   BMI 35.08 kg/m   Patient alert and oriented and in no cardiopulmonary distress.  HEENT: No facial asymmetry, EOMI,   oropharynx pink and moist.  Neck supple no JVD, no mass.Mild generalized facial  Acne, no significant pathology/ blackhead in area of concern on patient's right nostril Chest: Clear to auscultation bilaterally.  CVS: S1, S2 no murmurs, no  S3.Regular rate.  ABD: Soft non tender.   Ext: No edema  MS: Reduced  ROM spine, shoulders, hips and knees.  Skin: Intact, no ulcerations or rash noted.  Psych: Good eye contact, normal affect. Memory impaired not  anxious or depressed appearing.  CNS: CN 2-12 intact, .no focal deficits noted.   Assessment & Plan  Essential hypertension Controlled, no change in medication DASH diet and commitment to daily physical activity for a minimum of 30 minutes discussed and encouraged, as a part of hypertension management. The importance of attaining a healthy weight is also discussed.  BP/Weight 01/11/2017 12/10/2016 10/05/2016 10/05/2016 09/06/2016 04/20/91 09/15/8297  Systolic BP 371 696 789 381 017 510 258  Diastolic BP 84 86 90 88 80 86 78  Wt. (Lbs) 224 218 220 222 223.7 219.75 -  BMI 35.08 34.14 34.46 34.77 35.04 34.42 -       Non-insulin dependent type 2 diabetes mellitus ([place]) [name] is reminded of the importance of commitment to daily physical activity for 30 minutes or more, as able and the need to limit carbohydrate intake to 30 to 60 grams per meal to help with blood sugar control.   The need to take medication as prescribed, test blood sugar as directed, and to call between visits if there is a concern that blood sugar is uncontrolled is also discussed.   [name] is reminded of the importance of daily foot exam, annual eye examination, and good blood sugar, blood pressure and cholesterol control. No med change, needs to reduce carbs and sweets  Diabetic Labs Latest Ref Rng &  Units 11/17/2016 08/15/2016 08/14/2016 08/13/2016 07/14/2016  HbA1c <5.7 % of total Hgb 8.0(H) - 7.8(H) - 8.4(H)  Microalbumin Not estab mg/dL - - - - -  Micro/Creat Ratio <30 mcg/mg creat - - - - -  Chol <200 mg/dL 129 110 - - 116  HDL >50 mg/dL 67 58 - - 63  Calc LDL 0 - 99 mg/dL - 31 - - 25  Triglycerides <150 mg/dL 92 104 - - 140  Creatinine 0.60 - 0.93 mg/dL 1.05(H) 0.94 1.10(H) 1.16(H) 1.18(H)    BP/Weight 01/11/2017 12/10/2016 10/05/2016 10/05/2016 09/06/2016 03/26/9240 07/23/3417  Systolic BP 622 297 989 211 941 740 814  Diastolic BP 84 86 90 88 80 86 78  Wt. (Lbs) 224 218 220 222 223.7 219.75 -  BMI 35.08 34.14 34.46 34.77 35.04 34.42 -   Foot/eye exam completion dates Latest Ref Rng & Units 07/14/2016 05/29/2015  Eye Exam No Retinopathy - -  Foot exam Order - - -  Foot Form Completion - Done Done        Morbid obesity ([place]) Deteriorated. Patient re-educated about  the importance of commitment to regular exercise. The importance of healthy food choices with portion control discussed. Encouraged to start a food diary, count calories and to consider  joining a support group. Sample diet sheets offered. Goals set by the patient for the next several months.   Weight /BMI 01/11/2017 12/10/2016 10/05/2016  WEIGHT 224 lb 218 lb 220 lb  HEIGHT 5\' 7"  - 5\' 7"   BMI 35.08 kg/m2 34.14 kg/m2 34.46 kg/m2      Memory loss Continue aricept at current dose  Atypical chest pain Need to f/u on recent cardiology tests  Pelvic pain Intends to reschedule gyne exam, had to cancel last appt due to bad weather  Encounter for chronic pain management The patient's Controlled Substance registry is reviewed and compliance confirmed. Adequacy of  Pain control and level of function is assessed. Medication dosing is adjusted as deemed appropriate. Twelve weeks of medication is prescribed , patient signs for the script and is provided with a follow up appointment between 11 to 12 weeks .   Acne Mild facial acne , generalized, however, pt reports that the lesion on right nostril bothers her from time to time. Topical antibiotic ointment provided for acute flares

## 2017-01-16 NOTE — Assessment & Plan Note (Signed)
Controlled, no change in medication DASH diet and commitment to daily physical activity for a minimum of 30 minutes discussed and encouraged, as a part of hypertension management. The importance of attaining a healthy weight is also discussed.  BP/Weight 01/11/2017 12/10/2016 10/05/2016 10/05/2016 09/06/2016 09/17/811 09/22/7193  Systolic BP 974 718 550 158 682 574 935  Diastolic BP 84 86 90 88 80 86 78  Wt. (Lbs) 224 218 220 222 223.7 219.75 -  BMI 35.08 34.14 34.46 34.77 35.04 34.42 -

## 2017-01-16 NOTE — Assessment & Plan Note (Signed)
Continue aricept at current dose

## 2017-01-16 NOTE — Assessment & Plan Note (Signed)
[name] is reminded of the importance of commitment to daily physical activity for 30 minutes or more, as able and the need to limit carbohydrate intake to 30 to 60 grams per meal to help with blood sugar control.   The need to take medication as prescribed, test blood sugar as directed, and to call between visits if there is a concern that blood sugar is uncontrolled is also discussed.   [name] is reminded of the importance of daily foot exam, annual eye examination, and good blood sugar, blood pressure and cholesterol control. No med change, needs to reduce carbs and sweets  Diabetic Labs Latest Ref Rng & Units 11/17/2016 08/15/2016 08/14/2016 08/13/2016 07/14/2016  HbA1c <5.7 % of total Hgb 8.0(H) - 7.8(H) - 8.4(H)  Microalbumin Not estab mg/dL - - - - -  Micro/Creat Ratio <30 mcg/mg creat - - - - -  Chol <200 mg/dL 129 110 - - 116  HDL >50 mg/dL 67 58 - - 63  Calc LDL 0 - 99 mg/dL - 31 - - 25  Triglycerides <150 mg/dL 92 104 - - 140  Creatinine 0.60 - 0.93 mg/dL 1.05(H) 0.94 1.10(H) 1.16(H) 1.18(H)   BP/Weight 01/11/2017 12/10/2016 10/05/2016 10/05/2016 09/06/2016 08/22/2421 06/18/6142  Systolic BP 315 400 867 619 509 326 712  Diastolic BP 84 86 90 88 80 86 78  Wt. (Lbs) 224 218 220 222 223.7 219.75 -  BMI 35.08 34.14 34.46 34.77 35.04 34.42 -   Foot/eye exam completion dates Latest Ref Rng & Units 07/14/2016 05/29/2015  Eye Exam No Retinopathy - -  Foot exam Order - - -  Foot Form Completion - Done Done

## 2017-01-16 NOTE — Assessment & Plan Note (Signed)
The patient's Controlled Substance registry is reviewed and compliance confirmed. Adequacy of  Pain control and level of function is assessed. Medication dosing is adjusted as deemed appropriate. Twelve weeks of medication is prescribed , patient signs for the script and is provided with a follow up appointment between 11 to 12 weeks .  

## 2017-01-16 NOTE — Assessment & Plan Note (Signed)
Intends to reschedule gyne exam, had to cancel last appt due to bad weather

## 2017-01-16 NOTE — Assessment & Plan Note (Signed)
Mild facial acne , generalized, however, pt reports that the lesion on right nostril bothers her from time to time. Topical antibiotic ointment provided for acute flares

## 2017-01-18 ENCOUNTER — Encounter: Payer: Self-pay | Admitting: Family Medicine

## 2017-02-28 ENCOUNTER — Other Ambulatory Visit: Payer: Self-pay | Admitting: Family Medicine

## 2017-03-05 ENCOUNTER — Other Ambulatory Visit: Payer: Self-pay | Admitting: Family Medicine

## 2017-03-11 ENCOUNTER — Other Ambulatory Visit: Payer: Self-pay | Admitting: Family Medicine

## 2017-03-11 ENCOUNTER — Telehealth: Payer: Self-pay | Admitting: Family Medicine

## 2017-03-11 NOTE — Telephone Encounter (Signed)
Patient is experiencing itching per voicemail left on nurse line.  [phone]

## 2017-03-14 ENCOUNTER — Other Ambulatory Visit: Payer: Self-pay | Admitting: Family Medicine

## 2017-03-15 ENCOUNTER — Other Ambulatory Visit: Payer: Self-pay | Admitting: Family Medicine

## 2017-03-15 MED ORDER — HYDROXYZINE HCL 10 MG PO TABS: ORAL_TABLET | ORAL | 1 refills | Status: DC

## 2017-03-15 NOTE — Telephone Encounter (Signed)
Medication sent for bedtime use as needed for irtching

## 2017-03-15 NOTE — Telephone Encounter (Signed)
Would like hydroxyzine.

## 2017-03-15 NOTE — Progress Notes (Signed)
Hydroxyzine being sent for itching

## 2017-03-23 ENCOUNTER — Telehealth: Payer: Self-pay | Admitting: Family Medicine

## 2017-03-23 NOTE — Telephone Encounter (Signed)
This will be addressed at her next face to face visit documentation needed on 2/26 please let [facility] know

## 2017-03-23 NOTE — Telephone Encounter (Signed)
[name] stopped by requesting to see [name] or Dr Moshe Cipro, I advised both was in Clinic, and I would relay the message. [name] is moving back home, and they are requesting a hosp bed.

## 2017-03-24 ENCOUNTER — Telehealth: Payer: Self-pay | Admitting: Family Medicine

## 2017-03-24 NOTE — Telephone Encounter (Signed)
lvm on patients machine to call back for this information.

## 2017-03-24 NOTE — Telephone Encounter (Signed)
This has already been sent to Dr

## 2017-03-24 NOTE — Telephone Encounter (Signed)
Patient's sister, [name], left message on nurse line stating that patient was moving back to [place] in her home and wants to know if a prescription for a hospital bed can be sent to [facility].

## 2017-04-10 ENCOUNTER — Other Ambulatory Visit: Payer: Self-pay | Admitting: Family Medicine

## 2017-04-12 ENCOUNTER — Encounter: Payer: Self-pay | Admitting: Family Medicine

## 2017-04-12 ENCOUNTER — Ambulatory Visit (INDEPENDENT_AMBULATORY_CARE_PROVIDER_SITE_OTHER): Payer: PPO | Admitting: Family Medicine

## 2017-04-12 VITALS — BP 138/84 | HR 92 | Resp 16 | Ht 67.0 in | Wt 221.0 lb

## 2017-04-12 DIAGNOSIS — E559 Vitamin D deficiency, unspecified: Secondary | ICD-10-CM

## 2017-04-12 DIAGNOSIS — G8929 Other chronic pain: Secondary | ICD-10-CM | POA: Diagnosis not present

## 2017-04-12 DIAGNOSIS — J302 Other seasonal allergic rhinitis: Secondary | ICD-10-CM

## 2017-04-12 DIAGNOSIS — E8881 Metabolic syndrome: Secondary | ICD-10-CM | POA: Diagnosis not present

## 2017-04-12 DIAGNOSIS — E785 Hyperlipidemia, unspecified: Secondary | ICD-10-CM | POA: Diagnosis not present

## 2017-04-12 DIAGNOSIS — L299 Pruritus, unspecified: Secondary | ICD-10-CM | POA: Diagnosis not present

## 2017-04-12 DIAGNOSIS — I10 Essential (primary) hypertension: Secondary | ICD-10-CM

## 2017-04-12 DIAGNOSIS — L989 Disorder of the skin and subcutaneous tissue, unspecified: Secondary | ICD-10-CM | POA: Diagnosis not present

## 2017-04-12 DIAGNOSIS — F418 Other specified anxiety disorders: Secondary | ICD-10-CM | POA: Diagnosis not present

## 2017-04-12 DIAGNOSIS — H1013 Acute atopic conjunctivitis, bilateral: Secondary | ICD-10-CM | POA: Diagnosis not present

## 2017-04-12 DIAGNOSIS — E119 Type 2 diabetes mellitus without complications: Secondary | ICD-10-CM

## 2017-04-12 MED ORDER — OXYCODONE-ACETAMINOPHEN 10-325 MG PO TABS: 1.0000 | ORAL_TABLET | Freq: Two times a day (BID) | ORAL | 0 refills | Status: DC

## 2017-04-12 MED ORDER — TEMAZEPAM 15 MG PO CAPS: ORAL_CAPSULE | ORAL | 5 refills | Status: DC

## 2017-04-12 MED ORDER — HYDROXYZINE HCL 25 MG PO TABS: ORAL_TABLET | ORAL | 5 refills | Status: DC

## 2017-04-12 MED ORDER — OLOPATADINE HCL 0.1 % OP SOLN: 1.0000 [drp] | Freq: Two times a day (BID) | OPHTHALMIC | 1 refills | Status: DC

## 2017-04-12 NOTE — Progress Notes (Signed)
Fill Date ID Written Drug Qty Days Prescriber Rx # Pharmacy Refill Daily Dose * Pymt Type PMP  03/25/2017  1  01/11/2017  Oxycodone-Acetaminophen 10-325  60 30 Ma Sim  [id number] Nor ([id number])  0 30.00 MME Private Pay  [place]  02/23/2017  1  01/11/2017  Oxycodone-Acetaminophen 10-325  60 30 Ma Sim  [id number] Nor ([id number])  0 30.00 MME Private Pay  [place]  01/22/2017  1  01/11/2017  Oxycodone-Acetaminophen 10-325  60 30 Ma Sim  [id number] Nor ([id number])  0 30.00 MME Medicare  [place]  12/23/2016  1  10/05/2016  Oxycodone-Acetaminophen 10-325  60 30 Ma Sim  [id number] Nor ([id number])  0 30.00 MME Medicare  [place]  11/20/2016  1  10/05/2016  Oxycodone-Acetaminophen 10-325  60 30 Ma Sim  [id number] Nor ([id number])  0 30.00 MME Medicare  [place]  10/18/2016  1  10/05/2016  Oxycodone-Acetaminophen 10-325  60 30 Ma Sim  [id number] Nor ([id number])  0 30.00 MME Medicare  [place]  09/18/2016  1  09/13/2016  Oxycodone-Acetaminophen 10-325            Reviewed at visit

## 2017-04-12 NOTE — Patient Instructions (Addendum)
F/u last week in April, call if you need me before  Labs today, hBA1C, chem 7 and eGFR, , microalb and vit d  You are being referred to Dr Nevada Crane re lesion on nose  Eye drops will be sent for itchy eyes  Increase in dose of medication for itching , and new medication sent for sleep  Thank you  for choosing [facility]. We consider it a privelige to serve you.  Delivering excellent health care in a caring and  compassionate way is our goal.  Partnering with you,  so that together we can achieve this goal is our strategy.

## 2017-04-13 LAB — HEMOGLOBIN A1C
EAG (MMOL/L): 10.8 (calc)
Hgb A1c MFr Bld: 8.4 % of total Hgb — ABNORMAL HIGH (ref <5.7)
Mean Plasma Glucose: 194 (calc)

## 2017-04-13 LAB — BASIC METABOLIC PANEL WITH GFR
BUN/Creatinine Ratio: 18 (calc) (ref 6–22)
BUN: 19 mg/dL (ref 7–25)
CALCIUM: 9.3 mg/dL (ref 8.6–10.4)
CHLORIDE: 104 mmol/L (ref 98–110)
CO2: 17 mmol/L — AB (ref 20–32)
Creat: 1.07 mg/dL — ABNORMAL HIGH (ref 0.60–0.93)
GFR, Est African American: 58 mL/min/{1.73_m2} — ABNORMAL LOW (ref >=60)
GFR, Est Non African American: 50 mL/min/{1.73_m2} — ABNORMAL LOW (ref >=60)
Glucose, Bld: 193 mg/dL — ABNORMAL HIGH (ref 65–99)
POTASSIUM: 3.9 mmol/L (ref 3.5–5.3)
SODIUM: 134 mmol/L — AB (ref 135–146)

## 2017-04-13 LAB — MICROALBUMIN / CREATININE URINE RATIO
Creatinine, Urine: 123 mg/dL (ref 20–275)
Microalb Creat Ratio: 150 mcg/mg creat — ABNORMAL HIGH (ref <30)
Microalb, Ur: 18.5 mg/dL

## 2017-04-13 LAB — VITAMIN D 25 HYDROXY (VIT D DEFICIENCY, FRACTURES): Vit D, 25-Hydroxy: 30 ng/mL (ref 30–100)

## 2017-04-14 ENCOUNTER — Emergency Department (HOSPITAL_COMMUNITY): Payer: PPO

## 2017-04-14 ENCOUNTER — Encounter (HOSPITAL_COMMUNITY): Payer: Self-pay

## 2017-04-14 ENCOUNTER — Emergency Department (HOSPITAL_COMMUNITY)
Admission: EM | Admit: 2017-04-14 | Discharge: 2017-04-14 | Disposition: A | Discharge status: Home / Self Care | Payer: PPO | Attending: Emergency Medicine | Admitting: Emergency Medicine

## 2017-04-14 DIAGNOSIS — M316 Other giant cell arteritis: Secondary | ICD-10-CM | POA: Insufficient documentation

## 2017-04-14 DIAGNOSIS — Z7984 Long term (current) use of oral hypoglycemic drugs: Secondary | ICD-10-CM | POA: Insufficient documentation

## 2017-04-14 DIAGNOSIS — R51 Headache: Secondary | ICD-10-CM | POA: Diagnosis not present

## 2017-04-14 DIAGNOSIS — Z7902 Long term (current) use of antithrombotics/antiplatelets: Secondary | ICD-10-CM | POA: Diagnosis not present

## 2017-04-14 DIAGNOSIS — R700 Elevated erythrocyte sedimentation rate: Secondary | ICD-10-CM | POA: Insufficient documentation

## 2017-04-14 DIAGNOSIS — E119 Type 2 diabetes mellitus without complications: Secondary | ICD-10-CM | POA: Insufficient documentation

## 2017-04-14 DIAGNOSIS — I4891 Unspecified atrial fibrillation: Secondary | ICD-10-CM | POA: Diagnosis not present

## 2017-04-14 DIAGNOSIS — Z79899 Other long term (current) drug therapy: Secondary | ICD-10-CM | POA: Diagnosis not present

## 2017-04-14 DIAGNOSIS — R519 Headache, unspecified: Secondary | ICD-10-CM

## 2017-04-14 DIAGNOSIS — I10 Essential (primary) hypertension: Secondary | ICD-10-CM | POA: Diagnosis not present

## 2017-04-14 DIAGNOSIS — E785 Hyperlipidemia, unspecified: Secondary | ICD-10-CM | POA: Insufficient documentation

## 2017-04-14 DIAGNOSIS — Z86718 Personal history of other venous thrombosis and embolism: Secondary | ICD-10-CM | POA: Insufficient documentation

## 2017-04-14 LAB — BASIC METABOLIC PANEL
ANION GAP: 9 (ref 5–15)
BUN: 12 mg/dL (ref 6–20)
CALCIUM: 8.8 mg/dL — AB (ref 8.9–10.3)
CO2: 21 mmol/L — AB (ref 22–32)
Chloride: 106 mmol/L (ref 101–111)
Creatinine, Ser: 1.04 mg/dL — ABNORMAL HIGH (ref 0.44–1.00)
GFR calc Af Amer: 59 mL/min — ABNORMAL LOW (ref >60)
GFR, EST NON AFRICAN AMERICAN: 50 mL/min — AB (ref >60)
Glucose, Bld: 166 mg/dL — ABNORMAL HIGH (ref 65–99)
Potassium: 4.3 mmol/L (ref 3.5–5.1)
Sodium: 136 mmol/L (ref 135–145)

## 2017-04-14 LAB — SEDIMENTATION RATE: Sed Rate: 91 mm/hr — ABNORMAL HIGH (ref 0–22)

## 2017-04-14 LAB — CBC WITH DIFFERENTIAL/PLATELET
BASOS ABS: 0 10*3/uL (ref 0.0–0.1)
BASOS PCT: 0 %
Eosinophils Absolute: 0 10*3/uL (ref 0.0–0.7)
Eosinophils Relative: 0 %
HEMATOCRIT: 33.8 % — AB (ref 36.0–46.0)
HEMOGLOBIN: 10.7 g/dL — AB (ref 12.0–15.0)
Lymphocytes Relative: 19 %
Lymphs Abs: 1.8 10*3/uL (ref 0.7–4.0)
MCH: 26.9 pg (ref 26.0–34.0)
MCHC: 31.7 g/dL (ref 30.0–36.0)
MCV: 84.9 fL (ref 78.0–100.0)
MONOS PCT: 9 %
Monocytes Absolute: 0.9 10*3/uL (ref 0.1–1.0)
NEUTROS ABS: 6.7 10*3/uL (ref 1.7–7.7)
NEUTROS PCT: 72 %
Platelets: 329 10*3/uL (ref 150–400)
RBC: 3.98 MIL/uL (ref 3.87–5.11)
RDW: 14.4 % (ref 11.5–15.5)
WBC: 9.3 10*3/uL (ref 4.0–10.5)

## 2017-04-14 LAB — MAGNESIUM: Magnesium: 1.9 mg/dL (ref 1.7–2.4)

## 2017-04-14 MED ORDER — METHYLPREDNISOLONE SODIUM SUCC 125 MG IJ SOLR
125.0000 mg | Freq: Once | INTRAMUSCULAR | Status: AC
  Administered 2017-04-14: 125 mg via INTRAVENOUS
  Filled 2017-04-14: qty 2

## 2017-04-14 MED ORDER — DIPHENHYDRAMINE HCL 50 MG/ML IJ SOLN
12.5000 mg | Freq: Once | INTRAMUSCULAR | Status: AC
  Administered 2017-04-14: 12.5 mg via INTRAVENOUS
  Filled 2017-04-14: qty 1

## 2017-04-14 MED ORDER — PREDNISONE 20 MG PO TABS: 20.0000 mg | ORAL_TABLET | Freq: Every day | ORAL | 0 refills | Status: DC

## 2017-04-14 MED ORDER — METOCLOPRAMIDE HCL 5 MG/ML IJ SOLN
5.0000 mg | Freq: Once | INTRAMUSCULAR | Status: AC
  Administered 2017-04-14: 5 mg via INTRAVENOUS
  Filled 2017-04-14: qty 2

## 2017-04-14 MED ORDER — MAGNESIUM SULFATE 2 GM/50ML IV SOLN
2.0000 g | Freq: Once | INTRAVENOUS | Status: AC
  Administered 2017-04-14: 2 g via INTRAVENOUS
  Filled 2017-04-14: qty 50

## 2017-04-14 MED ORDER — HYDROCODONE-ACETAMINOPHEN 5-325 MG PO TABS
1.0000 | ORAL_TABLET | Freq: Once | ORAL | Status: AC
  Administered 2017-04-14: 1 via ORAL
  Filled 2017-04-14: qty 1

## 2017-04-14 MED ORDER — HYDROCODONE-ACETAMINOPHEN 5-325 MG PO TABS: 1.0000 | ORAL_TABLET | Freq: Four times a day (QID) | ORAL | 0 refills | Status: DC | PRN

## 2017-04-14 NOTE — ED Triage Notes (Signed)
Dr. Jeneen Rinks notified of pt's symptoms. MD came to triage to evaluate pt. Pt taken to ED room per MD.

## 2017-04-14 NOTE — ED Notes (Addendum)
Pt reports she she sudden onset of pain in right eye along with blurry vision that radiated to the back of her head on the right side. Pt woke up at 0600 and felt normal. The pain and blurry vision started a few minutes later. Pain hasn't completely resolved but intensifies intermittently.   Pt denies weakness in arms or legs that is different than her normal. Pt has weakness in bilateral lower extremities normally. Pt's hand grips are equal, no arm or leg drift. No facial weakness or droop. Eye movements are WNL. Pt does report blurry vision in the right eye. No slurred speech.

## 2017-04-14 NOTE — ED Provider Notes (Signed)
[facility] EMERGENCY DEPARTMENT Provider Note   CSN: [id number] Arrival date & time: 04/14/17  1228     History   Chief Complaint Chief Complaint  Patient presents with  . Headache    HPI [name] is a 77 y.o. female.  Complaint is right sided headache, and right-sided blurry vision.  HPI: 77 year old female.  History of glaucoma.  History of migraine headaches.  Awakened this morning with right-sided headache.  And right-sided blurry vision.  Headache is different than her typical migraine headaches.  She takes Topamax prophylactically for migraines.  Her last migraine headache was about 1 year ago.  History of PMR, temporal arteritis, or other rheumatologic conditions.  Denies jaw pain with chewing.  Denies left eye symptoms.  Neck pain.  No fever.  No sinus or URI symptoms.  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 Anemia, iron deficiency 2012   Evaluated by Dr. Oneida Alar; H&H of 9.3/30.8 with [place] in 10/2010; 3/3 positive Hemoccult cards in 07/2011  . Anxiety    was on Prozac for a couple of weeks  . Atrial fibrillation ([place])    takes Coumadin daily  . Bruises easily    takes Coumadin  . Chronic anticoagulation 07/21/2010  . Chronic back pain    related to knee pain  . Degenerative joint disease    Knees  . Depression   . Diabetes mellitus    takes Metformin daily  . Gastroesophageal reflux disease    takes Omeprazole daily  . Glaucoma   . Glaucoma    uses eye drops at night  . History of blood transfusion 1969  . History of gout    was on medication but taken off of several months ago  . Hx of colonic polyps   . Hx of migraines    last one about a yr ago;takes Topamax daily  . Hyperlipidemia    takes Pravastatin daily  . Hypertension    takes Hyzaar daily and Propranolol   . Insomnia    takes Restoril prn   . Joint pain   . Joint swelling   . Memory loss    takes donepezil  . Migraine   . Migraine   . Nocturia   . Overweight(278.02)   . Pneumonia 1969   hx of  . Pulmonary embolism [facility]) May 2012   acute presentation, bilateral PE  . Skin spots-aging   . Urinary frequency     Patient Active Problem List   Diagnosis Date Noted  . Acne 01/16/2017  . Pelvic pain 10/05/2016  . Atypical chest pain 08/14/2016  . Prolonged Q-T interval on ECG 08/14/2016  . Encounter for chronic pain management 07/17/2016  . Chronic pain syndrome 02/03/2016  . Bilateral knee pain 04/23/2014  . Non-insulin dependent type 2 diabetes mellitus ([place]) 11/15/2013  . Right-sided low back pain with right-sided sciatica 07/02/2013  . Seasonal allergies 04/11/2013  . Urinary incontinence 04/11/2013  . Chest pain, unspecified 04/11/2013  . Metabolic syndrome X 62/70/3500  . Chronic anticoagulation 12/22/2012  . Vitamin D deficiency 11/05/2012  . Memory loss 11/01/2012  . Depression with anxiety 11/01/2012  . Anemia, iron deficiency   . Morbid obesity ([place]) 08/16/2008  . Dyslipidemia 05/30/2007  . Migraine 05/30/2007  . Essential hypertension 05/30/2007  . Gastroesophageal reflux disease 05/30/2007    Past Surgical History:  Procedure Laterality Date  . CATARACT EXTRACTION W/PHACO  Right 04/11/2012   Procedure: CATARACT EXTRACTION PHACO AND INTRAOCULAR LENS PLACEMENT (IOC);  Surgeon: Elta Guadeloupe T. Gershon Crane, MD;  Location: [facility] ORS;  Service: Ophthalmology;  Laterality: Right;  CDE=9.61  . CATARACT EXTRACTION W/PHACO Left 12/26/2012   Procedure: CATARACT EXTRACTION PHACO AND INTRAOCULAR LENS PLACEMENT (IOC);  Surgeon: Elta Guadeloupe T. Gershon Crane, MD;  Location: [facility] ORS;  Service: Ophthalmology;  Laterality: Left;  CDE:7.74  . COLONOSCOPY  Jan 2002; 2012   2002: Dr. Tamala Julian, ext. hemorrhoids, nl colon; 2012-adenomatous polyps, gastritis on EGD  . DILATION AND CURETTAGE OF UTERUS    . ESOPHAGOGASTRODUODENOSCOPY  08/24/2011    ESOPHAGOGASTRODUODENOSCOPY; esophageal dilatation; Rogene Houston, MD;  . Freda Munro CAPSULE STUDY  08/18/2011   Procedure: GIVENS CAPSULE STUDY;  Surgeon: Rogene Houston, MD;  Location: [facility] ENDO SUITE;  Service: Endoscopy;  Laterality: N/A;  730  . KNEE ARTHROSCOPY W/ MENISCECTOMY  90's   Left  . ORIF ANKLE FRACTURE Right 90's  . TONSILLECTOMY    . 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 UPPER GASTROINTESTINAL ENDOSCOPY    . URETHRAL DILATION      OB History    No data available       Home Medications    Prior to Admission medications   Medication Sig Start Date End Date Taking? Authorizing Provider  ACCU-CHEK FASTCLIX LANCETS [place] Once daily testing dx e11.9 11/24/15  Yes [name], MD  acyclovir (ZOVIRAX) 400 MG tablet TAKE 1 TABLET (400 MG TOTAL) BY MOUTH 3 (THREE) TIMES DAILY. 08/13/16  Yes [name], MD  amLODipine (NORVASC) 10 MG tablet Take 1 tablet (10 mg total) by mouth daily. 01/11/17  Yes [name], MD  atorvastatin (LIPITOR) 40 MG tablet TAKE 1 TABLET (40 MG TOTAL) BY MOUTH DAILY. 11/03/16  Yes [name], MD  Blood Glucose Monitoring Suppl (ACCU-CHEK AVIVA PLUS) w/Device KIT For once daily testing dx E11.9 11/24/15  Yes [name], MD  busPIRone (BUSPAR) 7.5 MG tablet TAKE 1 TABLET (7.5 MG TOTAL) BY MOUTH 3 (THREE) TIMES DAILY. 03/07/17  Yes [name], MD  cloNIDine (CATAPRES) 0.2 MG tablet Take 1 tablet (0.2 mg total) by mouth 2 (two) times daily. 10/05/16  Yes [name], MD  COMBIGAN 0.2-0.5 % ophthalmic solution Place 1 drop into both eyes 2 (two) times daily. 03/23/17  Yes [provider]  donepezil (ARICEPT) 10 MG tablet Take 1 tablet (10 mg total) by mouth at bedtime. 01/11/17  Yes [name], MD  gabapentin (NEURONTIN) 300 MG capsule Take 300 mg by mouth at bedtime.    Yes [provider]  glucose blood (ONE TOUCH ULTRA TEST) test strip USE TO TEST DAILY 03/09/17  Yes [name], MD  hydrOXYzine (ATARAX/VISTARIL) 25 MG tablet One tablet at bedtime 04/12/17  Yes [name], MD  imipramine (TOFRANIL) 50 MG tablet Take 2 tablets (100 mg total) by mouth at bedtime. 01/11/17  Yes [name], MD  linagliptin (TRADJENTA) 5 MG TABS tablet Take 1 tablet (5 mg total) by mouth daily. 01/11/17  Yes [name], MD  meclizine (ANTIVERT) 25 MG tablet TAKE 1 TABLET THREE TIMES DAILY AS NEEDED FOR DIZZINESS. 01/11/17  Yes [name], MD  metFORMIN (GLUCOPHAGE) 500 MG tablet Take 1 tablet (500 mg total) by mouth 2 (two) times daily with a meal. 01/11/17  Yes  [name], MD  montelukast (SINGULAIR) 10 MG tablet TAKE 1 TABLET (10 MG TOTAL) BY MOUTH AT BEDTIME. 11/03/16  Yes [name], MD  olopatadine (PATANOL) 0.1 % ophthalmic solution Place 1 drop into both eyes 2 (two) times daily. 04/12/17  Yes [name], MD  omeprazole (PRILOSEC) 40 MG capsule TAKE 1 CAPSULE BY MOUTH TWICE A DAY 03/14/17  Yes [name], MD  oxyCODONE-acetaminophen (PERCOCET) 10-325 MG tablet Take 1 tablet by mouth 2 (two) times daily. 04/12/17  Yes [name], MD  PRADAXA 150 MG CAPS capsule TAKE ONE CAPSULE BY MOUTH EVERY 12 HOURS 10/11/16  Yes Arnoldo Lenis, MD  propranolol (INDERAL) 80 MG tablet TAKE 1 TABLET BY MOUTH EVERY DAY 04/12/17  Yes [name], MD  SUMAtriptan [facility]) 5 MG/ACT nasal spray Place 1 spray into the nose every 2 (two) hours as needed. 01/16/17  Yes [provider]  temazepam (RESTORIL) 15 MG capsule Two capsules at bedtime 04/12/17  Yes [name], MD  topiramate (TOPAMAX) 100 MG tablet Take 1 tablet (100 mg total) by mouth 2 (two) times daily. 01/11/17  Yes [name], MD  VENTOLIN HFA 108 (90 Base) MCG/ACT inhaler INHALE 2 PUFFS INTO THE LUNGS EVERY 6  (SIX) HOURS AS NEEDED FOR WHEEZING OR SHORTNESS OF BREATH. 05/26/16  Yes [name], MD  Vitamin D, Ergocalciferol, (DRISDOL) 50000 units CAPS capsule TAKE 1 CAPSULE (50,000 UNITS TOTAL) BY MOUTH ONCE A WEEK. 11/10/16  Yes [name], MD  HYDROcodone-acetaminophen (NORCO/VICODIN) 5-325 MG tablet Take 1 tablet by mouth every 6 (six) hours as needed for moderate pain. 04/14/17   Tanna Furry, MD  predniSONE (DELTASONE) 20 MG tablet Take 1 tablet (20 mg total) by mouth daily with breakfast. 04/14/17   Tanna Furry, MD    Family History Family History  Problem Relation Age of Onset  . Diabetes Mother   . Hypertension Mother   . Arthritis Mother   . Heart failure Mother   . Migraines Mother   . Kidney failure Mother   . Leukemia Father   . Migraines Father   . Kidney failure Father   . Diabetes Sister   . Hypertension Sister   . Diabetes Brother   . Hypertension Brother   . Hypertension Brother   . Hypertension Brother   . Hypertension Brother   . Diabetes Brother   . Diabetes Brother   . Diabetes Brother   . Pulmonary embolism Sister   . Migraines Sister   . Kidney failure Brother   . Colon cancer Neg Hx   . Colon polyps Neg Hx     Social History Social History   Tobacco Use  . Smoking status: Never Smoker  . Smokeless tobacco: Never Used  Substance Use Topics  . Alcohol use: No  . Drug use: No     Allergies   Penicillins; Fentanyl; Sulfonamide derivatives; and Codeine   Review of Systems Review of Systems  Constitutional: Negative for appetite change, chills, diaphoresis, fatigue and fever.  HENT: Negative for mouth sores, sore throat and trouble swallowing.   Eyes: Positive for visual disturbance.  Respiratory: Negative for cough, chest tightness, shortness of breath and wheezing.   Cardiovascular: Negative for chest pain.  Gastrointestinal: Negative for abdominal distention, abdominal pain, diarrhea, nausea and vomiting.  Endocrine: Negative for  polydipsia, polyphagia and polyuria.  Genitourinary: Negative for dysuria, frequency and hematuria.  Musculoskeletal: Negative for gait problem.  Skin: Negative for color  change, pallor and rash.  Neurological: Positive for headaches. Negative for dizziness, syncope and light-headedness.  Hematological: Does not bruise/bleed easily.  Psychiatric/Behavioral: Negative for behavioral problems and confusion.     Physical Exam Updated Vital Signs BP 125/68   Pulse 90   Temp 98.6 F (37 C) (Oral)   Resp (!) 24   Ht _0  (1.702 m)   Wt 100.2 kg (221 lb)   SpO2 97%   BMI 34.61 kg/m   Physical Exam  Constitutional: She is oriented to person, place, and time. She appears well-developed and well-nourished. No distress.  HENT:  Head: Normocephalic.  Right eye and adnexa examined normally.  Tender over the right temporalis region.  Not inflamed, erythematous, and no vesicles.  Eyes: Conjunctivae are normal. Pupils are equal, round, and reactive to light. No scleral icterus.  Pupils symmetric, 3 mm-reactive, no a fair pupillary defect.  No pain with eye movement.  Globe and adnexa appear normal.  Visualize retina shows normal vessels.  No pale retina, no blood/thunder.  Neck: Normal range of motion. Neck supple. No thyromegaly present.  Cardiovascular: Normal rate and regular rhythm. Exam reveals no gallop and no friction rub.  No murmur heard. Pulmonary/Chest: Effort normal and breath sounds normal. No respiratory distress. She has no wheezes. She has no rales.  Abdominal: Soft. Bowel sounds are normal. She exhibits no distension. There is no tenderness. There is no rebound.  Musculoskeletal: Normal range of motion.  Neurological: She is alert and oriented to person, place, and time.  Skin: Skin is warm and dry. No rash noted.  Psychiatric: She has a normal mood and affect. Her behavior is normal.     ED Treatments / Results  Labs (all labs ordered are listed, but only abnormal  results are displayed) Labs Reviewed  BASIC METABOLIC PANEL - Abnormal; Notable for the following components:      Result Value   CO2 21 (*)    Glucose, Bld 166 (*)    Creatinine, Ser 1.04 (*)    Calcium 8.8 (*)    GFR calc non Af Amer 50 (*)    GFR calc Af Amer 59 (*)    All other components within normal limits  CBC WITH DIFFERENTIAL/PLATELET - Abnormal; Notable for the following components:   Hemoglobin 10.7 (*)    HCT 33.8 (*)    All other components within normal limits  SEDIMENTATION RATE - Abnormal; Notable for the following components:   Sed Rate 91 (*)    All other components within normal limits  MAGNESIUM    EKG  EKG Interpretation None       Radiology Ct Head Wo Contrast  Result Date: 04/14/2017 CLINICAL DATA:  Headache and blurred vision EXAM: CT HEAD WITHOUT CONTRAST TECHNIQUE: Contiguous axial images were obtained from the base of the skull through the vertex without intravenous contrast. COMPARISON:  Head CT February 12, 2010 and brain MRI November 06, 2012 FINDINGS: Brain: There is age related volume loss. There is no intracranial mass, hemorrhage, extra-axial fluid collection, or midline shift. There is minimal small vessel disease in the centra semiovale bilaterally. Elsewhere gray-white compartments appear normal. No evident acute infarct. Vascular: No hyperdense vessel evident. There are foci of calcification in each carotid siphon region. Skull: Bony calvarium appears intact. Sinuses/Orbits: There is mild mucosal thickening in several ethmoid air cells. Other paranasal sinuses are clear. Orbits appear symmetric bilaterally. Other: Mastoid air cells on the left are clear. There is opacification in several inferior mastoid  air cells on right. IMPRESSION: Age related volume loss with minimal periventricular small vessel disease. No acute infarct. No mass or hemorrhage. There are foci of arterial vascular calcification. There is mucosal thickening in several ethmoid  air cells. There is mild inferior mastoid air cell disease on the right. Electronically Signed   By: Lowella Grip III M.D.   On: 04/14/2017 14:37    Procedures Procedures (including critical care time)  Medications Ordered in ED Medications  methylPREDNISolone sodium succinate (SOLU-MEDROL) 125 mg/2 mL injection 125 mg (not administered)  HYDROcodone-acetaminophen (NORCO/VICODIN) 5-325 MG per tablet 1 tablet (not administered)  diphenhydrAMINE (BENADRYL) injection 12.5 mg (12.5 mg Intravenous Given 04/14/17 1306)  metoCLOPramide (REGLAN) injection 5 mg (5 mg Intravenous Given 04/14/17 1308)  magnesium sulfate IVPB 2 g 50 mL (0 g Intravenous Stopped 04/14/17 1419)     Initial Impression / Assessment and Plan / ED Course  I have reviewed the triage vital signs and the nursing notes.  Pertinent labs & imaging results that were available during my care of the patient were reviewed by me and considered in my medical decision making (see chart for details).   T scan shows no acute findings.  Visual acuity OD 20/40 (affected eye), OS 20/50, OU 20/50.  Constellation of symptoms and findings his right sided temporal headache, decreased right eye vision subjectively, and elevated sed rate.  This is consistent in an elderly female with possible temporal arteritis.  Was given IV Solu-Medrol.  She has an ophthalmologist that she follows with for her glaucoma.  I attempted to call her ophthalmologist in the office was closed.  Of asked him to call tomorrow to make recheck appointment.  I discussed hyperglycemia associated with steroid treatment.  She may ultimately need temporal artery biopsy.  Plan prednisone 20 daily.  Ophthalmology follow-up.  Primary care reevaluation.  ER with acute changes.  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

## 2017-04-14 NOTE — ED Notes (Signed)
Pt conts to report intermittent right eye pain

## 2017-04-14 NOTE — Discharge Instructions (Signed)
Your headache may be from a condition called Temporal Arteritis". This is from an immune reaction from your bodaytoward the arteries in your head and eye.  Treatment if steroid medication--Prednisone.  Call Dr. Moshe Cipro, your PCP, and Dr. Clide Dales your Ophthalmologist to arrange follow up appointments as soon as possible.  Prednisone can make your blood sugars high, and can require an adjustment in your diabetic medication.

## 2017-04-14 NOTE — ED Triage Notes (Addendum)
Pt c/o headache and blurry vision in right eye that started at 6 am. Takes daily medication for hx of migraines.

## 2017-04-15 ENCOUNTER — Telehealth: Payer: Self-pay

## 2017-04-15 NOTE — Telephone Encounter (Signed)
Opened in error

## 2017-04-15 NOTE — Telephone Encounter (Signed)
-----   Message from [name], MD sent at 04/15/2017 11:51 AM EST ----- PLS SEND ME A MESSAGE AFTER CLARIFYING FROM PHARMACY COLLECTION EXACTLY WHAT PT IS TAKING FOR HER DIABETES. nO CHANGE IS TO BE MADE IN MEDICATION, BUT FAMILY NEEDS TO LIMIT HER SUGAR AND STARCH INTAKE MORE AS HER hba1c IS ABOVE 8, HIGHER THAN LAST TIME. THANKS

## 2017-04-17 ENCOUNTER — Encounter: Payer: Self-pay | Admitting: Family Medicine

## 2017-04-17 DIAGNOSIS — L989 Disorder of the skin and subcutaneous tissue, unspecified: Secondary | ICD-10-CM | POA: Insufficient documentation

## 2017-04-17 DIAGNOSIS — H1010 Acute atopic conjunctivitis, unspecified eye: Secondary | ICD-10-CM | POA: Insufficient documentation

## 2017-04-17 NOTE — Assessment & Plan Note (Signed)
Lesion on nose persistent despite topical therapy which pt finds bothersome, refer to dermatology

## 2017-04-17 NOTE — Assessment & Plan Note (Signed)
The patient's Controlled Substance registry is reviewed and compliance confirmed. Adequacy of  Pain control and level of function is assessed. Medication dosing is adjusted as deemed appropriate. Twelve weeks of medication is prescribed , patient signs for the script and is provided with a follow up appointment between 11 to 12 weeks .  

## 2017-04-17 NOTE — Assessment & Plan Note (Addendum)
Sub optimal control, needs to modify diet, deteriorate [name] is reminded of the importance of commitment to daily physical activity for 30 minutes or more, as able and the need to limit carbohydrate intake to 30 to 60 grams per meal to help with blood sugar control.   The need to take medication as prescribed, test blood sugar as directed, and to call between visits if there is a concern that blood sugar is uncontrolled is also discussed.   [name] is reminded of the importance of daily foot exam, annual eye examination, and good blood sugar, blood pressure and cholesterol control.  Diabetic Labs Latest Ref Rng & Units 04/14/2017 04/12/2017 11/17/2016 08/15/2016 08/14/2016  HbA1c <5.7 % of total Hgb - 8.4(H) 8.0(H) - 7.8(H)  Microalbumin mg/dL - 18.5 - - -  Micro/Creat Ratio <30 mcg/mg creat - 150(H) - - -  Chol <200 mg/dL - - 129 110 -  HDL >50 mg/dL - - 67 58 -  Calc LDL 0 - 99 mg/dL - - - 31 -  Triglycerides <150 mg/dL - - 92 104 -  Creatinine 0.44 - 1.00 mg/dL 1.04(H) 1.07(H) 1.05(H) 0.94 1.10(H)   BP/Weight 04/14/2017 04/12/2017 01/11/2017 12/10/2016 10/05/2016 10/05/2016 07/16/5613  Systolic BP 379 432 761 470 929 574 734  Diastolic BP 68 90 84 86 90 88 80  Wt. (Lbs) 221 221 224 218 220 222 223.7  BMI 34.61 34.61 35.08 34.14 34.46 34.77 35.04   Foot/eye exam completion dates Latest Ref Rng & Units 07/14/2016 05/29/2015  Eye Exam No Retinopathy - -  Foot exam Order - - -  Foot Form Completion - Done Done

## 2017-04-17 NOTE — Assessment & Plan Note (Signed)
Generalized , and uncontrolled, worse at night, increase dose of hydroxyzine and this is for bedtime use

## 2017-04-17 NOTE — Assessment & Plan Note (Addendum)
Controlled, no change in medication DASH diet and commitment to daily physical activity for a minimum of 30 minutes discussed and encouraged, as a part of hypertension management. The importance of attaining a healthy weight is also discussed.  BP/Weight 04/14/2017 04/12/2017 01/11/2017 12/10/2016 10/05/2016 10/05/2016 08/06/2977  Systolic BP 892 119 417 408 144 818 563  Diastolic BP 68 84 84 86 90 88 80  Wt. (Lbs) 221 221 224 218 220 222 223.7  BMI 34.61 34.61 35.08 34.14 34.46 34.77 35.04

## 2017-04-17 NOTE — Assessment & Plan Note (Signed)
Hyperlipidemia:Low fat diet discussed and encouraged.   Lipid Panel  Lab Results  Component Value Date   CHOL 129 11/17/2016   HDL 67 11/17/2016   LDLCALC 31 08/15/2016   TRIG 92 11/17/2016   CHOLHDL 1.9 11/17/2016    Controlled, no change in medication

## 2017-04-17 NOTE — Assessment & Plan Note (Signed)
Continue current medication.

## 2017-04-17 NOTE — Assessment & Plan Note (Signed)
Increased symptoms with itchy watery eyes, patanol drops prescribed, continue other  meds as before

## 2017-04-17 NOTE — Assessment & Plan Note (Signed)
Current flare, patanol prescribed

## 2017-04-17 NOTE — Progress Notes (Signed)
[name]     MRN: [id number]      DOB: 18-Nov-1940   HPI [name] is here for follow up and re-evaluation of chronic medical conditions, medication management, in particular chronic pain management  Still c/o lesion on nose despite use of topical cream, wants dermatology to evaluate Not testing blood sugar , and denies polyuria, polydipsia or blurred vision C/o excess itching and poor sleep , wants dose of hydroxyzine increased back to previous dose No longer interested in gyne eval, no currents peloric complaint .    The PT denies any adverse reactions to current medications since the last visit.     ROS Denies recent fever or chills. Denies sinus pressure, nasal congestion, ear pain or sore throat. Denies chest congestion, productive cough or wheezing. Denies chest pains, palpitations and leg swelling Denies abdominal pain, nausea, vomiting,diarrhea or constipation.   Denies dysuria, frequency, hesitancy or incontinence. C/o chronic  joint pain, and limitation in mobility. C/o  depression, anxiety and  Insomnia.Not suicidal or homicidal Denies skin break down has rash on nose and generalized itching   PE  BP 138/84   Pulse 92   Resp 16   Ht 5\' 7"  (1.702 m)   Wt 221 lb (100.2 kg)   SpO2 94%   BMI 34.61 kg/m   Patient alert and oriented and in no cardiopulmonary distress.  HEENT: No facial asymmetry, EOMI,   oropharynx pink and moist.  Neck supple no JVD, no mass.  Chest: Clear to auscultation bilaterally.  CVS: S1, S2 no murmurs, no S3.Regular rate.  ABD: Soft non tender.   Ext: No edema  TM:LYYTKPTWS  ROM spine, shoulders, hips and knees.  Skin: Intact, hypopigmented lesion at tip of nose.  Psych: Good eye contact, normal affect. Memory impaired mildly  anxious not  depressed appearing.  CNS: CN 2-12 intact, power,  normal throughout.no focal deficits noted.   Assessment & Plan  Encounter for chronic pain management The patient's Controlled  Substance registry is reviewed and compliance confirmed. Adequacy of  Pain control and level of function is assessed. Medication dosing is adjusted as deemed appropriate. Twelve weeks of medication is prescribed , patient signs for the script and is provided with a follow up appointment between 11 to 12 weeks .   Non-insulin dependent type 2 diabetes mellitus ([place]) Sub optimal control, needs to modify diet, deteriorate [name] is reminded of the importance of commitment to daily physical activity for 30 minutes or more, as able and the need to limit carbohydrate intake to 30 to 60 grams per meal to help with blood sugar control.   The need to take medication as prescribed, test blood sugar as directed, and to call between visits if there is a concern that blood sugar is uncontrolled is also discussed.   [name] is reminded of the importance of daily foot exam, annual eye examination, and good blood sugar, blood pressure and cholesterol control.  Diabetic Labs Latest Ref Rng & Units 04/14/2017 04/12/2017 11/17/2016 08/15/2016 08/14/2016  HbA1c <5.7 % of total Hgb - 8.4(H) 8.0(H) - 7.8(H)  Microalbumin mg/dL - 18.5 - - -  Micro/Creat Ratio <30 mcg/mg creat - 150(H) - - -  Chol <200 mg/dL - - 129 110 -  HDL >50 mg/dL - - 67 58 -  Calc LDL 0 - 99 mg/dL - - - 31 -  Triglycerides <150 mg/dL - - 92 104 -  Creatinine 0.44 - 1.00 mg/dL 1.04(H) 1.07(H) 1.05(H) 0.94  1.10(H)   BP/Weight 04/14/2017 04/12/2017 01/11/2017 12/10/2016 10/05/2016 10/05/2016 04/28/9700  Systolic BP 637 858 [phone] 676  Diastolic BP 68 90 84 86 90 88 80  Wt. (Lbs) 221 221 224 218 220 222 223.7  BMI 34.61 34.61 35.08 34.14 34.46 34.77 35.04   Foot/eye exam completion dates Latest Ref Rng & Units 07/14/2016 05/29/2015  Eye Exam No Retinopathy - -  Foot exam Order - - -  Foot Form Completion - Done Done        Essential hypertension Controlled, no change in medication DASH diet and commitment to daily physical  activity for a minimum of 30 minutes discussed and encouraged, as a part of hypertension management. The importance of attaining a healthy weight is also discussed.  BP/Weight 04/14/2017 04/12/2017 01/11/2017 12/10/2016 10/05/2016 10/05/2016 09/03/9468  Systolic BP 962 836 629 476 546 503 546  Diastolic BP 68 84 84 86 90 88 80  Wt. (Lbs) 221 221 224 218 220 222 223.7  BMI 34.61 34.61 35.08 34.14 34.46 34.77 35.04          Seasonal allergies Increased symptoms with itchy watery eyes, patanol drops prescribed, continue other  meds as before  Dyslipidemia Hyperlipidemia:Low fat diet discussed and encouraged.   Lipid Panel  Lab Results  Component Value Date   CHOL 129 11/17/2016   HDL 67 11/17/2016   LDLCALC 31 08/15/2016   TRIG 92 11/17/2016   CHOLHDL 1.9 11/17/2016    Controlled, no change in medication   Depression with anxiety Continue current medication  Skin lesion Lesion on nose persistent despite topical therapy which pt finds bothersome, refer to dermatology  Pruritus Generalized , and uncontrolled, worse at night, increase dose of hydroxyzine and this is for bedtime use  Allergic conjunctivitis Current flare, patanol prescribed

## 2017-04-22 DIAGNOSIS — Z961 Presence of intraocular lens: Secondary | ICD-10-CM | POA: Diagnosis not present

## 2017-04-22 DIAGNOSIS — H401133 Primary open-angle glaucoma, bilateral, severe stage: Secondary | ICD-10-CM | POA: Diagnosis not present

## 2017-04-22 DIAGNOSIS — E119 Type 2 diabetes mellitus without complications: Secondary | ICD-10-CM | POA: Diagnosis not present

## 2017-04-22 DIAGNOSIS — H26491 Other secondary cataract, right eye: Secondary | ICD-10-CM | POA: Diagnosis not present

## 2017-05-06 ENCOUNTER — Other Ambulatory Visit: Payer: Self-pay | Admitting: Family Medicine

## 2017-05-07 ENCOUNTER — Other Ambulatory Visit: Payer: Self-pay | Admitting: Family Medicine

## 2017-05-11 ENCOUNTER — Other Ambulatory Visit: Payer: Self-pay

## 2017-05-11 MED ORDER — BUSPIRONE HCL 7.5 MG PO TABS: 7.5000 mg | ORAL_TABLET | Freq: Three times a day (TID) | ORAL | 1 refills | Status: DC

## 2017-05-31 ENCOUNTER — Other Ambulatory Visit: Payer: Self-pay

## 2017-05-31 MED ORDER — HYDROXYZINE HCL 25 MG PO TABS: ORAL_TABLET | ORAL | 1 refills | Status: DC

## 2017-06-02 ENCOUNTER — Other Ambulatory Visit: Payer: Self-pay | Admitting: Family Medicine

## 2017-06-13 ENCOUNTER — Telehealth: Payer: Self-pay | Admitting: Family Medicine

## 2017-06-13 ENCOUNTER — Ambulatory Visit: Payer: Self-pay | Admitting: Family Medicine

## 2017-06-13 ENCOUNTER — Other Ambulatory Visit: Payer: Self-pay | Admitting: Family Medicine

## 2017-06-13 MED ORDER — OXYCODONE-ACETAMINOPHEN 10-325 MG PO TABS: ORAL_TABLET | ORAL | 0 refills | Status: DC

## 2017-06-13 NOTE — Telephone Encounter (Signed)
The Pt had an appt on 06-13-17, that had to be rescheduled, but will run out of Pain Management on 5-10, can we call her in a 30 day supply, then schedule and appointment to get back on track?

## 2017-06-13 NOTE — Telephone Encounter (Signed)
She needs to get fasting lipid, cmp and EGFR, hBA1C 1 week prior to her scheduled visit , I will send in a 30 day supply of pain meds to start May 10 so she needs and appointment scheduled 4 weeks out from the week of May 10 in June. Please advisee her to ensure she drinks sufficient water and she may have water the morning she is having labs done All of her labs are OVERDUE so it is VERY important that she get them, please emphasize, ALSO please [place] VISIT, AND ALSO TAT [place]

## 2017-06-13 NOTE — Progress Notes (Signed)
oxycodone

## 2017-06-14 ENCOUNTER — Other Ambulatory Visit: Payer: Self-pay | Admitting: Family Medicine

## 2017-06-18 ENCOUNTER — Emergency Department (HOSPITAL_COMMUNITY): Payer: PPO

## 2017-06-18 ENCOUNTER — Other Ambulatory Visit: Payer: Self-pay

## 2017-06-18 ENCOUNTER — Inpatient Hospital Stay (HOSPITAL_COMMUNITY)
Admission: EM | Admit: 2017-06-18 | Discharge: 2017-06-22 | DRG: 871 | Disposition: A | Discharge status: Home Health | Payer: PPO | Attending: Family Medicine | Admitting: Family Medicine

## 2017-06-18 ENCOUNTER — Encounter (HOSPITAL_COMMUNITY): Payer: Self-pay | Admitting: Emergency Medicine

## 2017-06-18 DIAGNOSIS — N390 Urinary tract infection, site not specified: Secondary | ICD-10-CM | POA: Diagnosis not present

## 2017-06-18 DIAGNOSIS — E669 Obesity, unspecified: Secondary | ICD-10-CM | POA: Diagnosis present

## 2017-06-18 DIAGNOSIS — R062 Wheezing: Secondary | ICD-10-CM

## 2017-06-18 DIAGNOSIS — G8929 Other chronic pain: Secondary | ICD-10-CM | POA: Diagnosis present

## 2017-06-18 DIAGNOSIS — E785 Hyperlipidemia, unspecified: Secondary | ICD-10-CM | POA: Diagnosis present

## 2017-06-18 DIAGNOSIS — F0390 Unspecified dementia without behavioral disturbance: Secondary | ICD-10-CM | POA: Diagnosis present

## 2017-06-18 DIAGNOSIS — G9341 Metabolic encephalopathy: Secondary | ICD-10-CM | POA: Diagnosis not present

## 2017-06-18 DIAGNOSIS — Z86711 Personal history of pulmonary embolism: Secondary | ICD-10-CM

## 2017-06-18 DIAGNOSIS — Z6835 Body mass index (BMI) 35.0-35.9, adult: Secondary | ICD-10-CM | POA: Diagnosis not present

## 2017-06-18 DIAGNOSIS — Z8261 Family history of arthritis: Secondary | ICD-10-CM

## 2017-06-18 DIAGNOSIS — R0602 Shortness of breath: Secondary | ICD-10-CM | POA: Diagnosis not present

## 2017-06-18 DIAGNOSIS — A419 Sepsis, unspecified organism: Secondary | ICD-10-CM | POA: Diagnosis present

## 2017-06-18 DIAGNOSIS — R404 Transient alteration of awareness: Secondary | ICD-10-CM | POA: Diagnosis not present

## 2017-06-18 DIAGNOSIS — M1990 Unspecified osteoarthritis, unspecified site: Secondary | ICD-10-CM | POA: Diagnosis present

## 2017-06-18 DIAGNOSIS — I4891 Unspecified atrial fibrillation: Secondary | ICD-10-CM | POA: Diagnosis not present

## 2017-06-18 DIAGNOSIS — E876 Hypokalemia: Secondary | ICD-10-CM | POA: Diagnosis not present

## 2017-06-18 DIAGNOSIS — R531 Weakness: Secondary | ICD-10-CM | POA: Diagnosis not present

## 2017-06-18 DIAGNOSIS — R6520 Severe sepsis without septic shock: Secondary | ICD-10-CM | POA: Diagnosis not present

## 2017-06-18 DIAGNOSIS — R509 Fever, unspecified: Secondary | ICD-10-CM | POA: Diagnosis not present

## 2017-06-18 DIAGNOSIS — Z833 Family history of diabetes mellitus: Secondary | ICD-10-CM

## 2017-06-18 DIAGNOSIS — M549 Dorsalgia, unspecified: Secondary | ICD-10-CM | POA: Diagnosis not present

## 2017-06-18 DIAGNOSIS — K219 Gastro-esophageal reflux disease without esophagitis: Secondary | ICD-10-CM | POA: Diagnosis not present

## 2017-06-18 DIAGNOSIS — R413 Other amnesia: Secondary | ICD-10-CM | POA: Diagnosis not present

## 2017-06-18 DIAGNOSIS — H409 Unspecified glaucoma: Secondary | ICD-10-CM | POA: Diagnosis not present

## 2017-06-18 DIAGNOSIS — Z7984 Long term (current) use of oral hypoglycemic drugs: Secondary | ICD-10-CM

## 2017-06-18 DIAGNOSIS — N3000 Acute cystitis without hematuria: Secondary | ICD-10-CM

## 2017-06-18 DIAGNOSIS — E860 Dehydration: Secondary | ICD-10-CM | POA: Diagnosis not present

## 2017-06-18 DIAGNOSIS — Z66 Do not resuscitate: Secondary | ICD-10-CM | POA: Diagnosis present

## 2017-06-18 DIAGNOSIS — I10 Essential (primary) hypertension: Secondary | ICD-10-CM | POA: Diagnosis present

## 2017-06-18 DIAGNOSIS — Z86010 Personal history of colonic polyps: Secondary | ICD-10-CM

## 2017-06-18 DIAGNOSIS — E119 Type 2 diabetes mellitus without complications: Secondary | ICD-10-CM | POA: Diagnosis not present

## 2017-06-18 DIAGNOSIS — Z79899 Other long term (current) drug therapy: Secondary | ICD-10-CM

## 2017-06-18 DIAGNOSIS — G43909 Migraine, unspecified, not intractable, without status migrainosus: Secondary | ICD-10-CM | POA: Diagnosis present

## 2017-06-18 DIAGNOSIS — A4151 Sepsis due to Escherichia coli [E. coli]: Secondary | ICD-10-CM | POA: Diagnosis not present

## 2017-06-18 DIAGNOSIS — N179 Acute kidney failure, unspecified: Secondary | ICD-10-CM | POA: Diagnosis present

## 2017-06-18 DIAGNOSIS — Z7952 Long term (current) use of systemic steroids: Secondary | ICD-10-CM

## 2017-06-18 DIAGNOSIS — G4700 Insomnia, unspecified: Secondary | ICD-10-CM | POA: Diagnosis present

## 2017-06-18 DIAGNOSIS — Z79891 Long term (current) use of opiate analgesic: Secondary | ICD-10-CM

## 2017-06-18 DIAGNOSIS — Z8701 Personal history of pneumonia (recurrent): Secondary | ICD-10-CM

## 2017-06-18 DIAGNOSIS — R4182 Altered mental status, unspecified: Secondary | ICD-10-CM | POA: Diagnosis not present

## 2017-06-18 DIAGNOSIS — Z8249 Family history of ischemic heart disease and other diseases of the circulatory system: Secondary | ICD-10-CM

## 2017-06-18 LAB — CBG MONITORING, ED: Glucose-Capillary: 368 mg/dL — ABNORMAL HIGH (ref 65–99)

## 2017-06-18 MED ORDER — SODIUM CHLORIDE 0.9 % IV BOLUS (SEPSIS)
1000.0000 mL | Freq: Once | INTRAVENOUS | Status: AC
  Administered 2017-06-18: 1000 mL via INTRAVENOUS

## 2017-06-18 MED ORDER — SODIUM CHLORIDE 0.9 % IV BOLUS (SEPSIS)
1000.0000 mL | Freq: Once | INTRAVENOUS | Status: AC
  Administered 2017-06-19: 1000 mL via INTRAVENOUS

## 2017-06-18 MED ORDER — SODIUM CHLORIDE 0.9 % IV SOLN
2.0000 g | Freq: Once | INTRAVENOUS | Status: AC
  Administered 2017-06-19: 2 g via INTRAVENOUS
  Filled 2017-06-18: qty 2

## 2017-06-18 MED ORDER — VANCOMYCIN HCL IN DEXTROSE 1-5 GM/200ML-% IV SOLN
1000.0000 mg | Freq: Once | INTRAVENOUS | Status: AC
  Administered 2017-06-19: 1000 mg via INTRAVENOUS
  Filled 2017-06-18: qty 200

## 2017-06-18 NOTE — ED Provider Notes (Signed)
[facility] EMERGENCY DEPARTMENT Provider Note   CSN: [id number] Arrival date & time: 06/18/17  2250     History   Chief Complaint Chief Complaint  Patient presents with  . Weakness  Level 5 caveat due to altered mental status  HPI [name] is a 77 y.o. female.  The history is provided by the patient and a relative.  Weakness  This is a new problem. The current episode started yesterday. The problem has been gradually worsening. There was no focality noted. The maximum temperature recorded prior to her arrival was 100 to 100.9 F. Associated symptoms include altered mental status and confusion. Pertinent negatives include no vomiting and no headaches.  Patient history of anemia, diabetes presents with fatigue and weakness of the past 24 hours.  Family reports she is had difficulty walking, she does appear globally weak, and is also had some confusion.  No vomiting or diarrhea.  They also note that she has had some elevated glucose.  No recent head trauma. No other acute complaints Patient is usually amatory  Past Medical History:  Diagnosis Date  . Anemia, iron deficiency 2012   Evaluated by Dr. Oneida Alar; H&H of 9.3/30.8 with [place] in 10/2010; 3/3 positive Hemoccult cards in 07/2011  . Anxiety    was on Prozac for a couple of weeks  . Atrial fibrillation ([place])    takes Coumadin daily  . Bruises easily    takes Coumadin  . Chronic anticoagulation 07/21/2010  . Chronic back pain    related to knee pain  . Degenerative joint disease    Knees  . Depression   . Diabetes mellitus    takes Metformin daily  . Gastroesophageal reflux disease    takes Omeprazole daily  . Glaucoma   . Glaucoma    uses eye drops at night  . History of blood transfusion 1969  . History of gout    was on medication but taken off of several months ago  . Hx of colonic polyps   . Hx of migraines    last one about a yr ago;takes Topamax daily  . Hyperlipidemia    takes Pravastatin daily  .  Hypertension    takes Hyzaar daily and Propranolol   . Insomnia    takes Restoril prn  . Joint pain   . Joint swelling   . Memory loss    takes donepezil  . Migraine   . Migraine   . Nocturia   . Overweight(278.02)   . Pneumonia 1969   hx of  . Pulmonary embolism [facility]) May 2012   acute presentation, bilateral PE  . Skin spots-aging   . Urinary frequency     Patient Active Problem List   Diagnosis Date Noted  . Skin lesion 04/17/2017  . Allergic conjunctivitis 04/17/2017  . Acne 01/16/2017  . Pelvic pain 10/05/2016  . Prolonged Q-T interval on ECG 08/14/2016  . Encounter for chronic pain management 07/17/2016  . Chronic pain syndrome 02/03/2016  . Bilateral knee pain 04/23/2014  . Non-insulin dependent type 2 diabetes mellitus ([place]) 11/15/2013  . Right-sided low back pain with right-sided sciatica 07/02/2013  . Seasonal allergies 04/11/2013  . Urinary incontinence 04/11/2013  . Metabolic syndrome X 78/29/5621  . Chronic anticoagulation 12/22/2012  . Vitamin D deficiency 11/05/2012  . Memory loss 11/01/2012  . Depression with anxiety 11/01/2012  . Anemia, iron deficiency   . Pruritus 09/03/2009  . Morbid obesity ([place]) 08/16/2008  . Dyslipidemia 05/30/2007  . Migraine 05/30/2007  .  Essential hypertension 05/30/2007  . Gastroesophageal reflux disease 05/30/2007    Past Surgical History:  Procedure Laterality Date  . CATARACT EXTRACTION W/PHACO Right 04/11/2012   Procedure: CATARACT EXTRACTION PHACO AND INTRAOCULAR LENS PLACEMENT (IOC);  Surgeon: Elta Guadeloupe T. Gershon Crane, MD;  Location: [facility] ORS;  Service: Ophthalmology;  Laterality: Right;  CDE=9.61  . CATARACT EXTRACTION W/PHACO Left 12/26/2012   Procedure: CATARACT EXTRACTION PHACO AND INTRAOCULAR LENS PLACEMENT (IOC);  Surgeon: Elta Guadeloupe T. Gershon Crane, MD;  Location: [facility] ORS;  Service: Ophthalmology;  Laterality: Left;  CDE:7.74  . COLONOSCOPY  Jan 2002; 2012   2002: Dr. Tamala Julian, ext. hemorrhoids, nl colon; 2012-adenomatous polyps,  gastritis on EGD  . DILATION AND CURETTAGE OF UTERUS    . ESOPHAGOGASTRODUODENOSCOPY  08/24/2011   ESOPHAGOGASTRODUODENOSCOPY; esophageal dilatation; Rogene Houston, MD;  . Freda Munro CAPSULE STUDY  08/18/2011   Procedure: GIVENS CAPSULE STUDY;  Surgeon: Rogene Houston, MD;  Location: [facility] ENDO SUITE;  Service: Endoscopy;  Laterality: N/A;  730  . KNEE ARTHROSCOPY W/ MENISCECTOMY  90's   Left  . ORIF ANKLE FRACTURE Right 90's  . TONSILLECTOMY    . 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 UPPER GASTROINTESTINAL ENDOSCOPY    . URETHRAL DILATION       OB History   None      Home Medications    Prior to Admission medications   Medication Sig Start Date End Date Taking? Authorizing Provider  ACCU-CHEK FASTCLIX LANCETS Victor Once daily testing dx e11.9 11/24/15   [name], MD  acyclovir (ZOVIRAX) 400 MG tablet TAKE 1 TABLET (400 MG TOTAL) BY MOUTH 3 (THREE) TIMES DAILY. 08/13/16   [name], MD  amLODipine (NORVASC) 10 MG tablet Take 1 tablet (10 mg total) by mouth daily. 01/11/17   [name], MD  atorvastatin (LIPITOR) 40 MG tablet TAKE 1 TABLET (40 MG TOTAL) BY MOUTH DAILY. 11/03/16   [name], MD  Blood Glucose Monitoring Suppl (ACCU-CHEK AVIVA PLUS) w/Device KIT For once daily testing dx E11.9 11/24/15   [name], MD  busPIRone (BUSPAR) 7.5 MG tablet Take 1 tablet (7.5 mg total) by mouth 3 (three) times daily. 05/11/17   [name], MD  cloNIDine (CATAPRES) 0.2 MG tablet Take 1 tablet (0.2 mg total) by mouth 2 (two) times daily. 10/05/16   [name], MD  COMBIGAN 0.2-0.5 % ophthalmic solution Place 1 drop into both eyes 2 (two) times daily. 03/23/17   [provider]  donepezil (ARICEPT) 10 MG tablet Take 1 tablet (10 mg total) by mouth at bedtime. 01/11/17   [name], MD  gabapentin  (NEURONTIN) 300 MG capsule Take 300 mg by mouth at bedtime.    [provider]  glucose blood (ONE TOUCH ULTRA TEST) test strip USE TO TEST DAILY 06/06/17   [name], MD  HYDROcodone-acetaminophen (NORCO/VICODIN) 5-325 MG tablet Take 1 tablet by mouth every 6 (six) hours as needed for moderate pain. 04/14/17   Tanna Furry, MD  hydrOXYzine (ATARAX/VISTARIL) 25 MG tablet One tablet at bedtime 05/31/17   [name], MD  imipramine (TOFRANIL) 50 MG tablet Take 2 tablets (100 mg total) by mouth at bedtime. 01/11/17   [name], MD  linagliptin (TRADJENTA) 5 MG TABS tablet Take 1 tablet (5 mg total) by mouth daily. 01/11/17   [name], MD  meclizine (ANTIVERT) 25 MG tablet TAKE 1 TABLET THREE TIMES DAILY AS NEEDED FOR DIZZINESS. 01/11/17   [name], MD  metFORMIN (GLUCOPHAGE) 500 MG tablet Take 1 tablet (500 mg total) by mouth 2 (two) times daily with a meal. 01/11/17   [name], MD  montelukast (SINGULAIR) 10 MG tablet TAKE 1 TABLET (10 MG TOTAL) BY MOUTH AT BEDTIME. 11/03/16   [name], MD  olopatadine (PATANOL) 0.1 % ophthalmic solution INSTILL 1 DROP INTO BOTH EYES TWICE A DAY 06/14/17   [name], MD  omeprazole (PRILOSEC) 40 MG capsule TAKE 1 CAPSULE BY MOUTH TWICE A DAY 03/14/17   [name], MD  oxyCODONE-acetaminophen (PERCOCET) 10-325 MG tablet Take 1 tablet by mouth 2 (two) times daily. 04/12/17   [name], MD  oxyCODONE-acetaminophen [facility]) 10-325 MG tablet One tablet two times daily 06/24/17 07/25/17  [name], MD  PRADAXA 150 MG CAPS capsule TAKE ONE CAPSULE BY MOUTH EVERY 12 HOURS 10/11/16   Arnoldo Lenis, MD  predniSONE (DELTASONE) 20 MG tablet Take 1 tablet (20 mg total) by mouth daily with breakfast. 04/14/17   Tanna Furry, MD  propranolol (INDERAL) 80 MG tablet TAKE 1 TABLET BY MOUTH EVERY DAY 04/12/17   [name], MD  SUMAtriptan (IMITREX) 5 MG/ACT nasal  spray Place 1 spray into the nose every 2 (two) hours as needed. 01/16/17   [provider]  temazepam (RESTORIL) 15 MG capsule Two capsules at bedtime 04/12/17   [name], MD  topiramate (TOPAMAX) 100 MG tablet Take 1 tablet (100 mg total) by mouth 2 (two) times daily. 01/11/17   [name], MD  VENTOLIN HFA 108 (90 Base) MCG/ACT inhaler INHALE 2 PUFFS INTO THE LUNGS EVERY 6 (SIX) HOURS AS NEEDED FOR WHEEZING OR SHORTNESS OF BREATH. 05/26/16   [name], MD  Vitamin D, Ergocalciferol, (DRISDOL) 50000 units CAPS capsule TAKE 1 CAPSULE (50,000 UNITS TOTAL) BY MOUTH ONCE A WEEK. 05/09/17   [name], MD    Family History Family History  Problem Relation Age of Onset  . Diabetes Mother   . Hypertension Mother   . Arthritis Mother   . Heart failure Mother   . Migraines Mother   . Kidney failure Mother   . Leukemia Father   . Migraines Father   . Kidney failure Father   . Diabetes Sister   . Hypertension Sister   . Diabetes Brother   . Hypertension Brother   . Hypertension Brother   . Hypertension Brother   . Hypertension Brother   . Diabetes Brother   . Diabetes Brother   . Diabetes Brother   . Pulmonary embolism Sister   . Migraines Sister   . Kidney failure Brother   . Colon cancer Neg Hx   . Colon polyps Neg Hx     Social History Social History   Tobacco Use  . Smoking status: Never Smoker  . Smokeless tobacco: Never Used  Substance Use Topics  . Alcohol use: No  . Drug use: No     Allergies   Penicillins; Fentanyl; Sulfonamide derivatives; and Codeine   Review of Systems Review of Systems  Unable to perform ROS: Mental status change  Gastrointestinal: Negative for vomiting.  Neurological: Positive for weakness. Negative for headaches.  Psychiatric/Behavioral: Positive for confusion.     Physical Exam Updated Vital Signs BP 138/69   Pulse (!) 116   Temp 100 F (37.8 C) (Oral)  Resp 20   Ht 1.702 m (_0 )    Wt 117.9 kg (260 lb)   SpO2 96%   BMI 40.72 kg/m   Physical Exam CONSTITUTIONAL: Elderly, no acute distress HEAD: Normocephalic/atraumatic EYES: EOMI/PERRL ENMT: Mucous membranes moist NECK: supple no meningeal signs SPINE/BACK:entire spine nontender CV: S1/S2 noted, tachycardic LUNGS: Continued coarse breath sounds bilaterally ABDOMEN: soft, nontender, no rebound or guarding, bowel sounds noted throughout abdomen GU:no cva tenderness NEURO: Pt is awake/alert moves all extremitiesx4.  No facial droop.  Appears mildly confused She can move all extremities, but appears that legs are weaker than normal bilaterally EXTREMITIES: pulses normal/equal, full ROM SKIN: warm, color normal PSYCH: Unable to assess  ED Treatments / Results  Labs (all labs ordered are listed, but only abnormal results are displayed) Labs Reviewed  URINALYSIS, ROUTINE W REFLEX MICROSCOPIC - Abnormal; Notable for the following components:      Result Value   Color, Urine STRAW (*)    Glucose, UA >=500 (*)    Hgb urine dipstick SMALL (*)    Protein, ur 30 (*)    Leukocytes, UA LARGE (*)    WBC, UA >50 (*)    Bacteria, UA RARE (*)    All other components within normal limits  COMPREHENSIVE METABOLIC PANEL - Abnormal; Notable for the following components:   Sodium 133 (*)    Chloride 100 (*)    Glucose, Bld 341 (*)    Creatinine, Ser 1.31 (*)    Albumin 3.3 (*)    GFR calc non Af Amer 38 (*)    GFR calc Af Amer 44 (*)    All other components within normal limits  CBC WITH DIFFERENTIAL/PLATELET - Abnormal; Notable for the following components:   WBC 17.6 (*)    Hemoglobin 10.8 (*)    HCT 33.0 (*)    Monocytes Absolute 1.6 (*)    All other components within normal limits  CBG MONITORING, ED - Abnormal; Notable for the following components:   Glucose-Capillary 368 (*)    All other components within normal limits  I-STAT CG4 LACTIC ACID, ED - Abnormal; Notable for the following components:   Lactic  Acid, Venous 2.31 (*)    All other components within normal limits  CULTURE, BLOOD (ROUTINE X 2)  CULTURE, BLOOD (ROUTINE X 2)  URINE CULTURE  PROTIME-INR  I-STAT CG4 LACTIC ACID, ED    EKG EKG Interpretation  Date/Time:  Saturday Jun 18 2017 23:49:24 EDT Ventricular Rate:  123 PR Interval:    QRS Duration: 79 QT Interval:  316 QTC Calculation: 452 R Axis:   21 Text Interpretation:  Sinus tachycardia Abnormal R-wave progression, early transition Borderline T abnormalities, anterior leads Confirmed by Ripley Fraise [phone]) on 06/19/2017 12:28:57 AM   Radiology Dg Chest Port 1 View  Result Date: 06/18/2017 CLINICAL DATA:  Fever EXAM: PORTABLE CHEST 1 VIEW COMPARISON:  08/13/2016 FINDINGS: No focal airspace disease. Mild cardiomegaly with aortic atherosclerosis. No pneumothorax. IMPRESSION: No active disease.  Borderline to mild cardiomegaly. Electronically Signed   By: Donavan Foil M.D.   On: 06/18/2017 23:30    Procedures Procedures  CRITICAL CARE Performed by: Sharyon Cable Total critical care time: 45 minutes Critical care time was exclusive of separately billable procedures and treating other patients. Critical care was necessary to treat or prevent imminent or life-threatening deterioration. Critical care was time spent personally by me on the following activities: development of treatment plan with patient and/or surrogate as well as nursing, discussions with  consultants, evaluation of patient's response to treatment, examination of patient, obtaining history from patient or surrogate, ordering and performing treatments and interventions, ordering and review of laboratory studies, ordering and review of radiographic studies, pulse oximetry and re-evaluation of patient's condition. Patients with sepsis requiring IV fluids, IV antibiotics and admission.  Discussed with hospitalist for admission. Medications Ordered in ED Medications  sodium chloride 0.9 % bolus 1,000 mL  (0 mLs Intravenous Stopped 06/18/17 2340)    And  sodium chloride 0.9 % bolus 1,000 mL (0 mLs Intravenous Stopped 06/19/17 0101)    And  sodium chloride 0.9 % bolus 1,000 mL (0 mLs Intravenous Stopped 06/19/17 0102)    And  sodium chloride 0.9 % bolus 1,000 mL (0 mLs Intravenous Stopped 06/19/17 0138)  aztreonam (AZACTAM) 2 g in sodium chloride 0.9 % 100 mL IVPB (0 g Intravenous Stopped 06/19/17 0029)  vancomycin (VANCOCIN) IVPB 1000 mg/200 mL premix (0 mg Intravenous Stopped 06/19/17 0152)  vancomycin (VANCOCIN) IVPB 750 mg/150 ml premix (0 mg Intravenous Stopped 06/19/17 0152)  acetaminophen (TYLENOL) tablet 650 mg (650 mg Oral Given 06/19/17 0055)  oxyCODONE-acetaminophen (PERCOCET/ROXICET) 5-325 MG per tablet 1 tablet (1 tablet Oral Given 06/19/17 0055)     Initial Impression / Assessment and Plan / ED Course  I have reviewed the triage vital signs and the nursing notes.  Pertinent labs & imaging results that were available during my care of the patient were reviewed by me and considered in my medical decision making (see chart for details).     2:45 AM Patient arrived with altered mental status and fever.  She was given IV fluids and IV antibiotics.  She is now improved.  Blood pressures have remained appropriate.  Fever is improved.  She is awake alert this time.  She was also noted to be hyperglycemic as well, she is also dehydrated Her neck is supple, no meningeal signs.  She reports mild headache, but similar to prior. It does appear that she may have a UTI.  Abdominal exam is otherwise unremarkable Discussed with Dr. Darrick Meigs for admission to the hospital.  Patient and her family were updated on plan. Final Clinical Impressions(s) / ED Diagnoses   Final diagnoses:  Sepsis, due to unspecified organism [facility])  Dehydration  Acute cystitis without hematuria    ED Discharge Orders    None       Ripley Fraise, MD 06/19/17 [phone]

## 2017-06-18 NOTE — ED Triage Notes (Signed)
Weak for the last 48 hours   Diabetic CBG 403  Fever 100.3

## 2017-06-19 ENCOUNTER — Other Ambulatory Visit: Payer: Self-pay

## 2017-06-19 DIAGNOSIS — Z86711 Personal history of pulmonary embolism: Secondary | ICD-10-CM | POA: Diagnosis not present

## 2017-06-19 DIAGNOSIS — G4700 Insomnia, unspecified: Secondary | ICD-10-CM | POA: Diagnosis present

## 2017-06-19 DIAGNOSIS — N3000 Acute cystitis without hematuria: Secondary | ICD-10-CM | POA: Diagnosis not present

## 2017-06-19 DIAGNOSIS — R6520 Severe sepsis without septic shock: Secondary | ICD-10-CM | POA: Diagnosis present

## 2017-06-19 DIAGNOSIS — R413 Other amnesia: Secondary | ICD-10-CM | POA: Diagnosis present

## 2017-06-19 DIAGNOSIS — K219 Gastro-esophageal reflux disease without esophagitis: Secondary | ICD-10-CM | POA: Diagnosis present

## 2017-06-19 DIAGNOSIS — E669 Obesity, unspecified: Secondary | ICD-10-CM | POA: Diagnosis present

## 2017-06-19 DIAGNOSIS — Z6835 Body mass index (BMI) 35.0-35.9, adult: Secondary | ICD-10-CM | POA: Diagnosis not present

## 2017-06-19 DIAGNOSIS — H409 Unspecified glaucoma: Secondary | ICD-10-CM | POA: Diagnosis present

## 2017-06-19 DIAGNOSIS — G43909 Migraine, unspecified, not intractable, without status migrainosus: Secondary | ICD-10-CM | POA: Diagnosis present

## 2017-06-19 DIAGNOSIS — N179 Acute kidney failure, unspecified: Secondary | ICD-10-CM | POA: Diagnosis present

## 2017-06-19 DIAGNOSIS — E876 Hypokalemia: Secondary | ICD-10-CM | POA: Diagnosis present

## 2017-06-19 DIAGNOSIS — M1990 Unspecified osteoarthritis, unspecified site: Secondary | ICD-10-CM | POA: Diagnosis present

## 2017-06-19 DIAGNOSIS — E119 Type 2 diabetes mellitus without complications: Secondary | ICD-10-CM | POA: Diagnosis present

## 2017-06-19 DIAGNOSIS — G8929 Other chronic pain: Secondary | ICD-10-CM | POA: Diagnosis present

## 2017-06-19 DIAGNOSIS — A419 Sepsis, unspecified organism: Secondary | ICD-10-CM

## 2017-06-19 DIAGNOSIS — Z66 Do not resuscitate: Secondary | ICD-10-CM | POA: Diagnosis present

## 2017-06-19 DIAGNOSIS — E785 Hyperlipidemia, unspecified: Secondary | ICD-10-CM | POA: Diagnosis present

## 2017-06-19 DIAGNOSIS — G9341 Metabolic encephalopathy: Secondary | ICD-10-CM | POA: Diagnosis present

## 2017-06-19 DIAGNOSIS — E860 Dehydration: Secondary | ICD-10-CM | POA: Diagnosis present

## 2017-06-19 DIAGNOSIS — I4891 Unspecified atrial fibrillation: Secondary | ICD-10-CM | POA: Diagnosis present

## 2017-06-19 DIAGNOSIS — F0390 Unspecified dementia without behavioral disturbance: Secondary | ICD-10-CM | POA: Diagnosis present

## 2017-06-19 DIAGNOSIS — A4151 Sepsis due to Escherichia coli [E. coli]: Secondary | ICD-10-CM | POA: Diagnosis present

## 2017-06-19 DIAGNOSIS — I10 Essential (primary) hypertension: Secondary | ICD-10-CM | POA: Diagnosis present

## 2017-06-19 DIAGNOSIS — N390 Urinary tract infection, site not specified: Secondary | ICD-10-CM | POA: Diagnosis present

## 2017-06-19 DIAGNOSIS — R4182 Altered mental status, unspecified: Secondary | ICD-10-CM | POA: Diagnosis present

## 2017-06-19 DIAGNOSIS — M549 Dorsalgia, unspecified: Secondary | ICD-10-CM | POA: Diagnosis present

## 2017-06-19 LAB — PROTIME-INR
INR: 1.09
PROTHROMBIN TIME: 14 s (ref 11.4–15.2)

## 2017-06-19 LAB — COMPREHENSIVE METABOLIC PANEL
ALBUMIN: 3.2 g/dL — AB (ref 3.5–5.0)
ALBUMIN: 3.3 g/dL — AB (ref 3.5–5.0)
ALK PHOS: 99 U/L (ref 38–126)
ALT: 19 U/L (ref 14–54)
ALT: 19 U/L (ref 14–54)
ANION GAP: 10 (ref 5–15)
ANION GAP: 11 (ref 5–15)
AST: 15 U/L (ref 15–41)
AST: 17 U/L (ref 15–41)
Alkaline Phosphatase: 98 U/L (ref 38–126)
BILIRUBIN TOTAL: 0.5 mg/dL (ref 0.3–1.2)
BUN: 13 mg/dL (ref 6–20)
BUN: 11 mg/dL (ref 6–20)
CALCIUM: 8.7 mg/dL — AB (ref 8.9–10.3)
CALCIUM: 8.9 mg/dL (ref 8.9–10.3)
CO2: 22 mmol/L (ref 22–32)
CO2: 21 mmol/L — ABNORMAL LOW (ref 22–32)
Chloride: 100 mmol/L — ABNORMAL LOW (ref 101–111)
Chloride: 104 mmol/L (ref 101–111)
Creatinine, Ser: 1.31 mg/dL — ABNORMAL HIGH (ref 0.44–1.00)
Creatinine, Ser: 1.09 mg/dL — ABNORMAL HIGH (ref 0.44–1.00)
GFR calc Af Amer: 44 mL/min — ABNORMAL LOW (ref >60)
GFR calc Af Amer: 55 mL/min — ABNORMAL LOW (ref >60)
GFR calc non Af Amer: 48 mL/min — ABNORMAL LOW (ref >60)
GFR, EST NON AFRICAN AMERICAN: 38 mL/min — AB (ref >60)
GLUCOSE: 254 mg/dL — AB (ref 65–99)
GLUCOSE: 341 mg/dL — AB (ref 65–99)
POTASSIUM: 3.6 mmol/L (ref 3.5–5.1)
Potassium: 3.2 mmol/L — ABNORMAL LOW (ref 3.5–5.1)
Sodium: 133 mmol/L — ABNORMAL LOW (ref 135–145)
Sodium: 135 mmol/L (ref 135–145)
TOTAL PROTEIN: 7.5 g/dL (ref 6.5–8.1)
Total Bilirubin: 0.5 mg/dL (ref 0.3–1.2)
Total Protein: 7.5 g/dL (ref 6.5–8.1)

## 2017-06-19 LAB — CBC
HEMATOCRIT: 32.8 % — AB (ref 36.0–46.0)
HEMOGLOBIN: 10.7 g/dL — AB (ref 12.0–15.0)
MCH: 26.8 pg (ref 26.0–34.0)
MCHC: 32.6 g/dL (ref 30.0–36.0)
MCV: 82.2 fL (ref 78.0–100.0)
Platelets: 274 10*3/uL (ref 150–400)
RBC: 3.99 MIL/uL (ref 3.87–5.11)
RDW: 14.4 % (ref 11.5–15.5)
WBC: 16 10*3/uL — ABNORMAL HIGH (ref 4.0–10.5)

## 2017-06-19 LAB — URINALYSIS, ROUTINE W REFLEX MICROSCOPIC
Bilirubin Urine: NEGATIVE
Ketones, ur: NEGATIVE mg/dL
Nitrite: NEGATIVE
PH: 7 (ref 5.0–8.0)
Protein, ur: 30 mg/dL — AB
SPECIFIC GRAVITY, URINE: 1.008 (ref 1.005–1.030)
WBC, UA: >50 WBC/hpf — ABNORMAL HIGH (ref 0–5)

## 2017-06-19 LAB — CBC WITH DIFFERENTIAL/PLATELET
Basophils Absolute: 0 10*3/uL (ref 0.0–0.1)
Basophils Relative: 0 %
Eosinophils Absolute: 0 10*3/uL (ref 0.0–0.7)
Eosinophils Relative: 0 %
HCT: 33 % — ABNORMAL LOW (ref 36.0–46.0)
HEMOGLOBIN: 10.8 g/dL — AB (ref 12.0–15.0)
LYMPHS ABS: 1.6 10*3/uL (ref 0.7–4.0)
LYMPHS PCT: 9 %
MCH: 27.2 pg (ref 26.0–34.0)
MCHC: 32.7 g/dL (ref 30.0–36.0)
MCV: 83.1 fL (ref 78.0–100.0)
MONOS PCT: 9 %
Monocytes Absolute: 1.6 10*3/uL — ABNORMAL HIGH (ref 0.1–1.0)
NEUTROS PCT: 82 %
Neutro Abs: 14.4 10*3/uL (ref 1.7–7.7)
Platelets: 290 10*3/uL (ref 150–400)
RBC: 3.97 MIL/uL (ref 3.87–5.11)
RDW: 14.3 % (ref 11.5–15.5)
WBC: 17.6 10*3/uL — ABNORMAL HIGH (ref 4.0–10.5)

## 2017-06-19 LAB — GLUCOSE, CAPILLARY
Glucose-Capillary: 249 mg/dL — ABNORMAL HIGH (ref 65–99)
Glucose-Capillary: 279 mg/dL — ABNORMAL HIGH (ref 65–99)
Glucose-Capillary: 290 mg/dL — ABNORMAL HIGH (ref 65–99)
Glucose-Capillary: 200 mg/dL — ABNORMAL HIGH (ref 65–99)

## 2017-06-19 LAB — HEMOGLOBIN A1C
Hgb A1c MFr Bld: 10 % — ABNORMAL HIGH (ref 4.8–5.6)
Mean Plasma Glucose: 240.3 mg/dL

## 2017-06-19 LAB — I-STAT CG4 LACTIC ACID, ED
LACTIC ACID, VENOUS: 2.31 mmol/L — AB (ref 0.5–1.9)
Lactic Acid, Venous: 1.19 mmol/L (ref 0.5–1.9)

## 2017-06-19 MED ORDER — DABIGATRAN ETEXILATE MESYLATE 150 MG PO CAPS
150.0000 mg | ORAL_CAPSULE | Freq: Two times a day (BID) | ORAL | Status: DC
  Administered 2017-06-19 – 2017-06-22 (×7): 150 mg via ORAL
  Filled 2017-06-19 (×7): qty 2
  Filled 2017-06-19: qty 1
  Filled 2017-06-19 (×3): qty 2

## 2017-06-19 MED ORDER — GUAIFENESIN ER 600 MG PO TB12
1200.0000 mg | ORAL_TABLET | Freq: Two times a day (BID) | ORAL | Status: DC
  Administered 2017-06-19 – 2017-06-22 (×7): 1200 mg via ORAL
  Filled 2017-06-19 (×7): qty 2

## 2017-06-19 MED ORDER — ATORVASTATIN CALCIUM 40 MG PO TABS
40.0000 mg | ORAL_TABLET | Freq: Every day | ORAL | Status: DC
  Administered 2017-06-19 – 2017-06-22 (×4): 40 mg via ORAL
  Filled 2017-06-19 (×4): qty 1

## 2017-06-19 MED ORDER — SODIUM CHLORIDE 0.9 % IV SOLN
1.0000 g | Freq: Three times a day (TID) | INTRAVENOUS | Status: DC
  Administered 2017-06-19: 1 g via INTRAVENOUS
  Filled 2017-06-19 (×4): qty 1

## 2017-06-19 MED ORDER — TOPIRAMATE 100 MG PO TABS
100.0000 mg | ORAL_TABLET | Freq: Two times a day (BID) | ORAL | Status: DC
  Administered 2017-06-19 – 2017-06-22 (×7): 100 mg via ORAL
  Filled 2017-06-19 (×7): qty 1

## 2017-06-19 MED ORDER — OXYCODONE-ACETAMINOPHEN 5-325 MG PO TABS
2.0000 | ORAL_TABLET | Freq: Four times a day (QID) | ORAL | Status: DC | PRN
  Administered 2017-06-19 – 2017-06-22 (×8): 2 via ORAL
  Filled 2017-06-19 (×8): qty 2

## 2017-06-19 MED ORDER — POTASSIUM CHLORIDE CRYS ER 20 MEQ PO TBCR
40.0000 meq | EXTENDED_RELEASE_TABLET | Freq: Once | ORAL | Status: AC
  Administered 2017-06-19: 40 meq via ORAL
  Filled 2017-06-19: qty 2

## 2017-06-19 MED ORDER — ACETAMINOPHEN 325 MG PO TABS
650.0000 mg | ORAL_TABLET | Freq: Once | ORAL | Status: AC
  Administered 2017-06-19: 650 mg via ORAL
  Filled 2017-06-19: qty 2

## 2017-06-19 MED ORDER — BRIMONIDINE TARTRATE-TIMOLOL 0.2-0.5 % OP SOLN
1.0000 [drp] | Freq: Two times a day (BID) | OPHTHALMIC | Status: DC
  Filled 2017-06-19: qty 5

## 2017-06-19 MED ORDER — TIMOLOL MALEATE 0.5 % OP SOLN
1.0000 [drp] | Freq: Two times a day (BID) | OPHTHALMIC | Status: DC
  Administered 2017-06-19 – 2017-06-22 (×7): 1 [drp] via OPHTHALMIC
  Filled 2017-06-19 (×2): qty 5

## 2017-06-19 MED ORDER — BRIMONIDINE TARTRATE 0.2 % OP SOLN
1.0000 [drp] | Freq: Two times a day (BID) | OPHTHALMIC | Status: DC
  Administered 2017-06-19 – 2017-06-22 (×7): 1 [drp] via OPHTHALMIC
  Filled 2017-06-19 (×2): qty 5

## 2017-06-19 MED ORDER — GABAPENTIN 300 MG PO CAPS
300.0000 mg | ORAL_CAPSULE | Freq: Every day | ORAL | Status: DC
  Administered 2017-06-19 – 2017-06-21 (×3): 300 mg via ORAL
  Filled 2017-06-19 (×3): qty 1

## 2017-06-19 MED ORDER — PREDNISONE 20 MG PO TABS
20.0000 mg | ORAL_TABLET | Freq: Every day | ORAL | Status: DC
  Administered 2017-06-19: 20 mg via ORAL
  Filled 2017-06-19: qty 1

## 2017-06-19 MED ORDER — SODIUM CHLORIDE 0.9 % IV SOLN
1.0000 g | INTRAVENOUS | Status: DC
  Administered 2017-06-19 – 2017-06-21 (×2): 1 g via INTRAVENOUS
  Filled 2017-06-19 (×2): qty 10
  Filled 2017-06-19 (×2): qty 1

## 2017-06-19 MED ORDER — OXYCODONE-ACETAMINOPHEN 5-325 MG PO TABS
1.0000 | ORAL_TABLET | Freq: Once | ORAL | Status: AC
  Administered 2017-06-19: 1 via ORAL
  Filled 2017-06-19: qty 1

## 2017-06-19 MED ORDER — ONDANSETRON HCL 4 MG/2ML IJ SOLN
4.0000 mg | Freq: Four times a day (QID) | INTRAMUSCULAR | Status: DC | PRN
  Administered 2017-06-20: 4 mg via INTRAVENOUS
  Filled 2017-06-19: qty 2

## 2017-06-19 MED ORDER — DONEPEZIL HCL 5 MG PO TABS
10.0000 mg | ORAL_TABLET | Freq: Every day | ORAL | Status: DC
  Administered 2017-06-19 – 2017-06-21 (×3): 10 mg via ORAL
  Filled 2017-06-19 (×3): qty 2

## 2017-06-19 MED ORDER — VANCOMYCIN HCL IN DEXTROSE 750-5 MG/150ML-% IV SOLN
750.0000 mg | Freq: Once | INTRAVENOUS | Status: AC
  Administered 2017-06-19: 750 mg via INTRAVENOUS
  Filled 2017-06-19 (×2): qty 150

## 2017-06-19 MED ORDER — BUSPIRONE HCL 5 MG PO TABS
7.5000 mg | ORAL_TABLET | Freq: Three times a day (TID) | ORAL | Status: DC
  Administered 2017-06-19 – 2017-06-22 (×10): 7.5 mg via ORAL
  Filled 2017-06-19 (×10): qty 2

## 2017-06-19 MED ORDER — PROPRANOLOL HCL 20 MG PO TABS
80.0000 mg | ORAL_TABLET | Freq: Every day | ORAL | Status: DC
  Administered 2017-06-19 – 2017-06-22 (×4): 80 mg via ORAL
  Filled 2017-06-19 (×4): qty 4

## 2017-06-19 MED ORDER — AMLODIPINE BESYLATE 5 MG PO TABS
10.0000 mg | ORAL_TABLET | Freq: Every day | ORAL | Status: DC
  Administered 2017-06-19 – 2017-06-22 (×4): 10 mg via ORAL
  Filled 2017-06-19 (×4): qty 2

## 2017-06-19 MED ORDER — CLONIDINE HCL 0.2 MG PO TABS
0.2000 mg | ORAL_TABLET | Freq: Two times a day (BID) | ORAL | Status: DC
  Administered 2017-06-19 – 2017-06-22 (×7): 0.2 mg via ORAL
  Filled 2017-06-19 (×7): qty 1

## 2017-06-19 MED ORDER — VANCOMYCIN HCL 10 G IV SOLR
1250.0000 mg | INTRAVENOUS | Status: DC
  Filled 2017-06-19: qty 1250

## 2017-06-19 MED ORDER — ALBUTEROL SULFATE (2.5 MG/3ML) 0.083% IN NEBU: 3.0000 mL | INHALATION_SOLUTION | Freq: Four times a day (QID) | RESPIRATORY_TRACT | Status: DC | PRN

## 2017-06-19 MED ORDER — TEMAZEPAM 15 MG PO CAPS
15.0000 mg | ORAL_CAPSULE | Freq: Every day | ORAL | Status: DC
  Administered 2017-06-19 – 2017-06-21 (×3): 15 mg via ORAL
  Filled 2017-06-19 (×3): qty 1

## 2017-06-19 MED ORDER — SODIUM CHLORIDE 0.9 % IV SOLN
INTRAVENOUS | Status: DC
  Administered 2017-06-19: 07:00:00 via INTRAVENOUS

## 2017-06-19 MED ORDER — ONDANSETRON HCL 4 MG PO TABS: 4.0000 mg | ORAL_TABLET | Freq: Four times a day (QID) | ORAL | Status: DC | PRN

## 2017-06-19 MED ORDER — INSULIN ASPART 100 UNIT/ML ~~LOC~~ SOLN
0.0000 [IU] | Freq: Three times a day (TID) | SUBCUTANEOUS | Status: DC
  Administered 2017-06-19 (×2): 5 [IU] via SUBCUTANEOUS
  Administered 2017-06-19: 3 [IU] via SUBCUTANEOUS
  Administered 2017-06-20: 7 [IU] via SUBCUTANEOUS
  Administered 2017-06-20: 2 [IU] via SUBCUTANEOUS

## 2017-06-19 MED ORDER — PANTOPRAZOLE SODIUM 40 MG PO TBEC
40.0000 mg | DELAYED_RELEASE_TABLET | Freq: Every day | ORAL | Status: DC
  Administered 2017-06-19 – 2017-06-22 (×4): 40 mg via ORAL
  Filled 2017-06-19 (×4): qty 1

## 2017-06-19 NOTE — Progress Notes (Signed)
Patient admitted to the hospital earlier this morning by Dr. Darrick Meigs.  Patient seen and examined.  She feels mildly better than she did not admission.  Appears somewhat short of breath.  On exam, she has bibasilar crackles and 1+ edema  Patient was admitted to the hospital with confusion, altered mental status, fever.  Urinalysis indicated possible infection.  She was noted to be mildly dehydrated.  Started on intravenous hydration and intravenous antibiotics.  She was initially started on vancomycin and aztreonam.  Since she has tolerated cephalosporins in the past, will de-escalate antibiotics to Rocephin.  Continue to follow cultures.  She is showing some signs of volume overload.  Will hold further IV fluids for now.  Continue current treatments.  [facility]

## 2017-06-19 NOTE — H&P (Signed)
TRH H&P    Patient Demographics:    [name], is a 77 y.o. female  MRN: [id number]  DOB - [birth year]  Admit Date - 06/18/2017  Referring MD/NP/PA: Dr. Christy Gentles  Outpatient Primary MD for the patient is Moshe Cipro Norwood Levo, MD  Patient coming from: Home  Chief complaint-confusion   HPI:    [name]  is a 77 y.o. female, history of diabetes mellitus, atrial fibrillation, came to hospital with altered mental status.  As per daughter patient has been confused since yesterday.  Denies chest pain or shortness of breath.  No nausea vomiting or diarrhea. In the ED patient was found to have abnormal UA and started on vancomycin and Azactam for presumed sepsis. She denies chest pain or shortness of breath. No nausea, vomiting or diarrhea. She denies dysuria, urgency or frequency of urination. She does complain of pain in the lower abdominal   Review of systems:      All other systems reviewed and are negative.   With Past History of the following :    Past Medical History:  Diagnosis Date  . Anemia, iron deficiency 2012   Evaluated by Dr. Oneida Alar; H&H of 9.3/30.8 with [place] in 10/2010; 3/3 positive Hemoccult cards in 07/2011  . Anxiety    was on Prozac for a couple of weeks  . Atrial fibrillation ([place])    takes Coumadin daily  . Bruises easily    takes Coumadin  . Chronic anticoagulation 07/21/2010  . Chronic back pain    related to knee pain  . Degenerative joint disease    Knees  . Depression   . Diabetes mellitus    takes Metformin daily  . Gastroesophageal reflux disease    takes Omeprazole daily  . Glaucoma   . Glaucoma    uses eye drops at night  . History of blood transfusion 1969  . History of gout    was on medication but taken off of several months ago  . Hx of colonic polyps   . Hx of migraines    last one about a yr ago;takes Topamax daily  . Hyperlipidemia    takes  Pravastatin daily  . Hypertension    takes Hyzaar daily and Propranolol   . Insomnia    takes Restoril prn  . Joint pain   . Joint swelling   . Memory loss    takes donepezil  . Migraine   . Migraine   . Nocturia   . Overweight(278.02)   . Pneumonia 1969   hx of  . Pulmonary embolism [facility]) May 2012   acute presentation, bilateral PE  . Skin spots-aging   . Urinary frequency       Past Surgical History:  Procedure Laterality Date  . CATARACT EXTRACTION W/PHACO Right 04/11/2012   Procedure: CATARACT EXTRACTION PHACO AND INTRAOCULAR LENS PLACEMENT (IOC);  Surgeon: Elta Guadeloupe T. Gershon Crane, MD;  Location: [facility] OR[facility];  Service: Ophthalmology;  Laterality: Right;  CDE=9.61  . CATARACT EXTRACTION W/PHACO Left 12/26/2012   Procedure: CATARACT EXTRACTION PHACO AND INTRAOCULAR LENS PLACEMENT (  [place]);  Surgeon: Elta Guadeloupe T. Gershon Crane, MD;  Location: [facility];  Service: Ophthalmology;  Laterality: Left;  CDE:7.74  . COLONOSCOPY  Jan 2002; 2012   2002: Dr. Tamala Julian, ext. hemorrhoids, nl colon; 2012-adenomatous polyps, gastritis on EGD  . DILATION AND CURETTAGE OF UTERUS    . ESOPHAGOGASTRODUODENOSCOPY  08/24/2011   ESOPHAGOGASTRODUODENOSCOPY; esophageal dilatation; Rogene Houston, MD;  . Freda Munro CAPSULE STUDY  08/18/2011   Procedure: GIVENS CAPSULE STUDY;  Surgeon: Rogene Houston, MD;  Location: [facility] SUITE;  Service: Endoscopy;  Laterality: N/A;  730  . KNEE ARTHROSCOPY W/ MENISCECTOMY  90's   Left  . ORIF ANKLE FRACTURE Right 90's  . TONSILLECTOMY    . 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 UPPER GASTROINTESTINAL ENDOSCOPY    . URETHRAL DILATION        Social History:      Social History   Tobacco Use  . Smoking status: Never Smoker  . Smokeless tobacco: Never Used  Substance Use Topics  . Alcohol use: No       Family History :     Family History  Problem Relation Age  of Onset  . Diabetes Mother   . Hypertension Mother   . Arthritis Mother   . Heart failure Mother   . Migraines Mother   . Kidney failure Mother   . Leukemia Father   . Migraines Father   . Kidney failure Father   . Diabetes Sister   . Hypertension Sister   . Diabetes Brother   . Hypertension Brother   . Hypertension Brother   . Hypertension Brother   . Hypertension Brother   . Diabetes Brother   . Diabetes Brother   . Diabetes Brother   . Pulmonary embolism Sister   . Migraines Sister   . Kidney failure Brother   . Colon cancer Neg Hx   . Colon polyps Neg Hx       Home Medications:   Prior to Admission medications   Medication Sig Start Date End Date Taking? Authorizing Provider  ACCU-CHEK FASTCLIX LANCETS [place] Once daily testing dx e11.9 11/24/15   [name], MD  acyclovir (ZOVIRAX) 400 MG tablet TAKE 1 TABLET (400 MG TOTAL) BY MOUTH 3 (THREE) TIMES DAILY. 08/13/16   [name], MD  amLODipine (NORVASC) 10 MG tablet Take 1 tablet (10 mg total) by mouth daily. 01/11/17   [name], MD  atorvastatin (LIPITOR) 40 MG tablet TAKE 1 TABLET (40 MG TOTAL) BY MOUTH DAILY. 11/03/16   [name], MD  Blood Glucose Monitoring Suppl (ACCU-CHEK AVIVA PLUS) w/Device KIT For once daily testing dx E11.9 11/24/15   [name], MD  busPIRone (BUSPAR) 7.5 MG tablet Take 1 tablet (7.5 mg total) by mouth 3 (three) times daily. 05/11/17   [name], MD  cloNIDine (CATAPRES) 0.2 MG tablet Take 1 tablet (0.2 mg total) by mouth 2 (two) times daily. 10/05/16   [name], MD  COMBIGAN 0.2-0.5 % ophthalmic solution Place 1 drop into both eyes 2 (two) times daily. 03/23/17   [provider]  donepezil (ARICEPT) 10 MG tablet Take 1 tablet (10 mg total) by mouth at bedtime. 01/11/17   [name], MD  gabapentin (NEURONTIN) 300 MG capsule Take 300 mg by mouth at bedtime.    [provider]  glucose blood (  ONE TOUCH ULTRA  TEST) test strip USE TO TEST DAILY 06/06/17   [name], MD  HYDROcodone-acetaminophen (NORCO/VICODIN) 5-325 MG tablet Take 1 tablet by mouth every 6 (six) hours as needed for moderate pain. 04/14/17   Tanna Furry, MD  hydrOXYzine (ATARAX/VISTARIL) 25 MG tablet One tablet at bedtime 05/31/17   [name], MD  imipramine (TOFRANIL) 50 MG tablet Take 2 tablets (100 mg total) by mouth at bedtime. 01/11/17   [name], MD  linagliptin (TRADJENTA) 5 MG TABS tablet Take 1 tablet (5 mg total) by mouth daily. 01/11/17   [name], MD  meclizine (ANTIVERT) 25 MG tablet TAKE 1 TABLET THREE TIMES DAILY AS NEEDED FOR DIZZINESS. 01/11/17   [name], MD  metFORMIN (GLUCOPHAGE) 500 MG tablet Take 1 tablet (500 mg total) by mouth 2 (two) times daily with a meal. 01/11/17   [name], MD  montelukast (SINGULAIR) 10 MG tablet TAKE 1 TABLET (10 MG TOTAL) BY MOUTH AT BEDTIME. 11/03/16   [name], MD  olopatadine (PATANOL) 0.1 % ophthalmic solution INSTILL 1 DROP INTO BOTH EYES TWICE A DAY 06/14/17   [name], MD  omeprazole (PRILOSEC) 40 MG capsule TAKE 1 CAPSULE BY MOUTH TWICE A DAY 03/14/17   [name], MD  oxyCODONE-acetaminophen (PERCOCET) 10-325 MG tablet Take 1 tablet by mouth 2 (two) times daily. 04/12/17   [name], MD  oxyCODONE-acetaminophen [facility]) 10-325 MG tablet One tablet two times daily 06/24/17 07/25/17  [name], MD  PRADAXA 150 MG CAPS capsule TAKE ONE CAPSULE BY MOUTH EVERY 12 HOURS 10/11/16   Arnoldo Lenis, MD  predniSONE (DELTASONE) 20 MG tablet Take 1 tablet (20 mg total) by mouth daily with breakfast. 04/14/17   Tanna Furry, MD  propranolol (INDERAL) 80 MG tablet TAKE 1 TABLET BY MOUTH EVERY DAY 04/12/17   [name], MD  SUMAtriptan (IMITREX) 5 MG/ACT nasal spray Place 1 spray into the nose every 2 (two) hours as needed. 01/16/17   [provider]  temazepam (RESTORIL)  15 MG capsule Two capsules at bedtime 04/12/17   [name], MD  topiramate (TOPAMAX) 100 MG tablet Take 1 tablet (100 mg total) by mouth 2 (two) times daily. 01/11/17   [name], MD  VENTOLIN HFA 108 (90 Base) MCG/ACT inhaler INHALE 2 PUFFS INTO THE LUNGS EVERY 6 (SIX) HOURS AS NEEDED FOR WHEEZING OR SHORTNESS OF BREATH. 05/26/16   [name], MD  Vitamin D, Ergocalciferol, (DRISDOL) 50000 units CAPS capsule TAKE 1 CAPSULE (50,000 UNITS TOTAL) BY MOUTH ONCE A WEEK. 05/09/17   [name], MD     Allergies:        Physical Exam:   Vitals  Blood pressure (!) 160/89, pulse (!) 118, temperature 99.6 F (37.6 C), temperature source Rectal, resp. rate (!) 31, height 5' 6"  (1.676 m), weight 98 kg (216 lb), SpO2 98 %.  1.  General: Appears in no acute distress  2. Psychiatric:  Intact judgement and  insight, awake alert, oriented x 2  3. Neurologic: No focal neurological deficits, all cranial nerves intact.Strength 5/5 all 4 extremities, sensation intact all 4 extremities, plantars down going.  4. Eyes :  anicteric sclerae, moist conjunctivae with no lid lag. PERRLA.  5. ENMT:  Oropharynx clear with moist mucous membranes and good dentition  6. Neck:  supple, no cervical lymphadenopathy appriciated, No thyromegaly  7. Respiratory : Normal respiratory effort, good air  movement bilaterally,clear to  auscultation bilaterally  8. Cardiovascular : RRR, no gallops, rubs or murmurs, no leg edema  9. Gastrointestinal:  Positive bowel sounds, abdomen soft, non-tender to palpation,no hepatosplenomegaly, no rigidity or guarding       10. Skin:  No cyanosis, normal texture and turgor, no rash, lesions or ulcers  11.Musculoskeletal:  Good muscle tone,  joints appear normal , no effusions,  normal range of motion    Data Review:    CBC Recent Labs  Lab 06/19/17 0010  WBC 17.6*  HGB 10.8*  HCT 33.0*  PLT 290  MCV 83.1  MCH 27.2  MCHC 32.7    RDW 14.3  LYMPHSABS 1.6  MONOABS 1.6*  EOSABS 0.0  BASOSABS 0.0   ------------------------------------------------------------------------------------------------------------------  Chemistries  Recent Labs  Lab 06/19/17 0010  NA 133*  K 3.6  CL 100*  CO2 22  GLUCOSE 341*  BUN 13  CREATININE 1.31*  CALCIUM 8.9  AST 17  ALT 19  ALKPHOS 98  BILITOT 0.5   ------------------------------------------------------------------------------------------------------------------  ------------------------------------------------------------------------------------------------------------------ GFR: Estimated Creatinine Clearance: 42.5 mL/min (A) (by C-G formula based on SCr of 1.31 mg/dL (H)). Liver Function Tests: Recent Labs  Lab 06/19/17 0010  AST 17  ALT 19  ALKPHOS 98  BILITOT 0.5  PROT 7.5  ALBUMIN 3.3*   No results for input(s): LIPASE, AMYLASE in the last 168 hours. No results for input(s): AMMONIA in the last 168 hours. Coagulation Profile: Recent Labs  Lab 06/19/17 0010  INR 1.09   Cardiac Enzymes: No results for input(s): CKTOTAL, CKMB, CKMBINDEX, TROPONINI in the last 168 hours. BNP (last 3 results) No results for input(s): PROBNP in the last 8760 hours. HbA1C: No results for input(s): HGBA1C in the last 72 hours. CBG: Recent Labs  Lab 06/18/17 2341  GLUCAP 368*   Lipid Profile: No results for input(s): CHOL, HDL, LDLCALC, TRIG, CHOLHDL, LDLDIRECT in the last 72 hours. Thyroid Function Tests: No results for input(s): TSH, T4TOTAL, FREET4, T3FREE, THYROIDAB in the last 72 hours. Anemia Panel: No results for input(s): VITAMINB12, FOLATE, FERRITIN, TIBC, IRON, RETICCTPCT in the last 72 hours.  --------------------------------------------------------------------------------------------------------------- Urine analysis:    Component Value Date/Time   COLORURINE STRAW (A) 06/19/2017 0050   APPEARANCEUR CLEAR 06/19/2017 0050   LABSPEC 1.008  06/19/2017 0050   PHURINE 7.0 06/19/2017 0050   GLUCOSEU >=500 (A) 06/19/2017 0050   HGBUR SMALL (A) 06/19/2017 0050   HGBUR negative 01/06/2010 0929   BILIRUBINUR NEGATIVE 06/19/2017 0050   BILIRUBINUR moderate 11/17/2016 1438   KETONESUR NEGATIVE 06/19/2017 0050   PROTEINUR 30 (A) 06/19/2017 0050   UROBILINOGEN >=8.0 (A) 11/17/2016 1438   UROBILINOGEN 0.2 10/12/2011 1556   NITRITE NEGATIVE 06/19/2017 0050   LEUKOCYTESUR LARGE (A) 06/19/2017 0050      Imaging Results:    Dg Chest Port 1 View  Result Date: 06/18/2017 CLINICAL DATA:  Fever EXAM: PORTABLE CHEST 1 VIEW COMPARISON:  08/13/2016 FINDINGS: No focal airspace disease. Mild cardiomegaly with aortic atherosclerosis. No pneumothorax. IMPRESSION: No active disease.  Borderline to mild cardiomegaly. Electronically Signed   By: Donavan Foil M.D.   On: 06/18/2017 23:30      Assessment & Plan:    Active Problems:   Sepsis ([place])   1. Sepsis-due to UTI, will start patient on vancomycin and Azactam.  Follow urine culture and blood culture results. 2. Diabetes mellitus We will start patient on  sliding scale insulin with NovoLog.  We will hold oral hypoglycemic agents. 3. Hypertension-continue home medications including  Catapres, amlodipine  4. Dementia-no behavioral disturbance, continue Aricept     DVT Prophylaxis-   Pradaxa  AM Labs Ordered, also please review Full Orders  Family Communication: Admission, patients condition and plan of care including tests being ordered have been discussed with the patient and her daughter at bedside who indicate understanding and agree with the plan and Code Status.  Code Status: Full code  Admission status: Inpatient  Time spent in minutes : 60 min   Oswald Hillock M.D on 06/19/2017 at 5:07 AM  Between 7am to 7pm - Pager - [phone]. After 7pm go to [email or web address] - password [facility]  [facility] - Office  [phone]

## 2017-06-19 NOTE — Progress Notes (Signed)
ANTIBIOTIC CONSULT NOTE-Preliminary  Pharmacy Consult for Aztreonam and Vancomycin Indication: Sepsis  Patient Measurements: Height: 5\' 6"  (167.6 cm) Weight: 216 lb (98 kg) IBW/kg (Calculated) : 59.3  Vital Signs: Temp: 100.5 F (38.1 C) (05/05 0104) Temp Source: Oral (05/05 0104) BP: 137/87 (05/05 0200) Pulse Rate: 117 (05/05 0200)  Labs: Recent Labs    06/19/17 0010  WBC 17.6*  HGB 10.8*  PLT 290  CREATININE 1.31*    Estimated Creatinine Clearance: 42.5 mL/min (A) (by C-G formula based on SCr of 1.31 mg/dL (H)).  No results for input(s): VANCOTROUGH, VANCOPEAK, VANCORANDOM, GENTTROUGH, GENTPEAK, GENTRANDOM, TOBRATROUGH, TOBRAPEAK, TOBRARND, AMIKACINPEAK, AMIKACINTROU, AMIKACIN in the last 72 hours.   Microbiology: No results found for this or any previous visit (from the past 720 hour(s)).  Medical History: Past Medical History:  Diagnosis Date  . Anemia, iron deficiency 2012   Evaluated by Dr. Oneida Alar; H&H of 9.3/30.8 with [place] in 10/2010; 3/3 positive Hemoccult cards in 07/2011  . Anxiety    was on Prozac for a couple of weeks  . Atrial fibrillation ([place])    takes Coumadin daily  . Bruises easily    takes Coumadin  . Chronic anticoagulation 07/21/2010  . Chronic back pain    related to knee pain  . Degenerative joint disease    Knees  . Depression   . Diabetes mellitus    takes Metformin daily  . Gastroesophageal reflux disease    takes Omeprazole daily  . Glaucoma   . Glaucoma    uses eye drops at night  . History of blood transfusion 1969  . History of gout    was on medication but taken off of several months ago  . Hx of colonic polyps   . Hx of migraines    last one about a yr ago;takes Topamax daily  . Hyperlipidemia    takes Pravastatin daily  . Hypertension    takes Hyzaar daily and Propranolol   . Insomnia    takes Restoril prn  . Joint pain   . Joint swelling   . Memory loss    takes donepezil  . Migraine   . Migraine   . Nocturia    . Overweight(278.02)   . Pneumonia 1969   hx of  . Pulmonary embolism [facility]) May 2012   acute presentation, bilateral PE  . Skin spots-aging   . Urinary frequency     Medications:  Aztreonam 2 Gm IV x 1 ED dose Vancomycin 1000 mg IV x 1 ED dose  Assessment: 77 yo female seen in the ED for weakness, fever, and confusion. She has an elevated lactic acid and WBCs. Pharmacy has been consulted for aztreonam and vancomycin dosing for sepsis.  Goal of Therapy:  Vancomycin troughs 15-20 mcg/ml Eradicate  infection  Plan:  Preliminary review of pertinent patient information completed.  Protocol will be initiated with an additional dose of Vancomycin 750 mg IV for a total loading dose of 1750 mg IV.  Forestine Na clinical pharmacist will complete review during morning rounds to assess patient and finalize treatment regimen if needed.  Norberto Sorenson, [facility] 06/19/2017,3:11 AM

## 2017-06-19 NOTE — Progress Notes (Signed)
Pharmacy Antibiotic Note  [name] is a 77 y.o. female admitted on 06/18/2017 with sepsis.  Pharmacy has been consulted for Vancomycin and aztreonam dosing.  Plan: Vancomycin 1750mg  loading dose, then 1250mg  IV every 24 hours.  Goal trough 15-20 mcg/mL.  Aztreonam 2gm IV x 1 in ED, then 1gm IV q8h F/U cxs and clinical progress Monitor V/S, labs and levels as indicated  Height: 5\' 6"  (167.6 cm) Weight: 220 lb 14.4 oz (100.2 kg) IBW/kg (Calculated) : 59.3  Temp (24hrs), Avg:100.9 F (38.3 C), Min:99.6 F (37.6 C), Max:103.8 F (39.9 C)  Recent Labs  Lab 06/19/17 0010 06/19/17 0039 06/19/17 0142 06/19/17 0647  WBC 17.6*  --   --  16.0*  CREATININE 1.31*  --   --  1.09*  LATICACIDVEN  --  2.31* 1.19  --     Normalized CrCl 71mls/min  Estimated Creatinine Clearance: 51.7 mL/min (A) (by C-G formula based on SCr of 1.09 mg/dL (H)).     Antimicrobials this admission: Vancomycin 5/4 >>  Aztreonam 5/4 >>   Dose adjustments this admission: N/A  Microbiology results: 5/5 BCx: pending 5/5 UCx: pending  Thank you for allowing pharmacy to be a part of this patient's care.  Isac Sarna, BS Pharm D, [place] Clinical Pharmacist Pager [phone] 06/19/2017 9:55 AM

## 2017-06-20 ENCOUNTER — Inpatient Hospital Stay (HOSPITAL_COMMUNITY): Payer: PPO

## 2017-06-20 DIAGNOSIS — N390 Urinary tract infection, site not specified: Secondary | ICD-10-CM

## 2017-06-20 LAB — COMPREHENSIVE METABOLIC PANEL
ALBUMIN: 2.9 g/dL — AB (ref 3.5–5.0)
ALT: 17 U/L (ref 14–54)
ANION GAP: 7 (ref 5–15)
AST: 14 U/L — ABNORMAL LOW (ref 15–41)
Alkaline Phosphatase: 88 U/L (ref 38–126)
BUN: 17 mg/dL (ref 6–20)
CO2: 23 mmol/L (ref 22–32)
Calcium: 8.7 mg/dL — ABNORMAL LOW (ref 8.9–10.3)
Chloride: 104 mmol/L (ref 101–111)
Creatinine, Ser: 1.25 mg/dL — ABNORMAL HIGH (ref 0.44–1.00)
GFR calc non Af Amer: 40 mL/min — ABNORMAL LOW (ref >60)
GFR, EST AFRICAN AMERICAN: 47 mL/min — AB (ref >60)
GLUCOSE: 185 mg/dL — AB (ref 65–99)
POTASSIUM: 4.1 mmol/L (ref 3.5–5.1)
SODIUM: 134 mmol/L — AB (ref 135–145)
Total Bilirubin: 0.5 mg/dL (ref 0.3–1.2)
Total Protein: 7 g/dL (ref 6.5–8.1)

## 2017-06-20 LAB — CBC
HCT: 32.7 % — ABNORMAL LOW (ref 36.0–46.0)
Hemoglobin: 10.4 g/dL — ABNORMAL LOW (ref 12.0–15.0)
MCH: 26.5 pg (ref 26.0–34.0)
MCHC: 31.8 g/dL (ref 30.0–36.0)
MCV: 83.4 fL (ref 78.0–100.0)
Platelets: 251 10*3/uL (ref 150–400)
RBC: 3.92 MIL/uL (ref 3.87–5.11)
RDW: 14.7 % (ref 11.5–15.5)
WBC: 10.7 10*3/uL — ABNORMAL HIGH (ref 4.0–10.5)

## 2017-06-20 LAB — GLUCOSE, CAPILLARY
GLUCOSE-CAPILLARY: 336 mg/dL — AB (ref 65–99)
Glucose-Capillary: 181 mg/dL — ABNORMAL HIGH (ref 65–99)
Glucose-Capillary: 240 mg/dL — ABNORMAL HIGH (ref 65–99)
Glucose-Capillary: 179 mg/dL — ABNORMAL HIGH (ref 65–99)

## 2017-06-20 MED ORDER — ACETAMINOPHEN 325 MG PO TABS
650.0000 mg | ORAL_TABLET | Freq: Four times a day (QID) | ORAL | Status: DC | PRN
  Administered 2017-06-20 – 2017-06-22 (×2): 650 mg via ORAL
  Filled 2017-06-20 (×2): qty 2

## 2017-06-20 MED ORDER — HYDRALAZINE HCL 20 MG/ML IJ SOLN: 10.0000 mg | INTRAMUSCULAR | Status: DC | PRN

## 2017-06-20 MED ORDER — SODIUM CHLORIDE 0.9% FLUSH
10.0000 mL | INTRAVENOUS | Status: DC | PRN
  Administered 2017-06-21: 10 mL
  Filled 2017-06-20: qty 40

## 2017-06-20 MED ORDER — INSULIN ASPART 100 UNIT/ML ~~LOC~~ SOLN
0.0000 [IU] | Freq: Three times a day (TID) | SUBCUTANEOUS | Status: DC
  Administered 2017-06-20: 7 [IU] via SUBCUTANEOUS
  Administered 2017-06-21: 11 [IU] via SUBCUTANEOUS
  Administered 2017-06-21 (×2): 4 [IU] via SUBCUTANEOUS
  Administered 2017-06-22: 11 [IU] via SUBCUTANEOUS
  Administered 2017-06-22: 4 [IU] via SUBCUTANEOUS

## 2017-06-20 MED ORDER — INSULIN ASPART 100 UNIT/ML ~~LOC~~ SOLN
0.0000 [IU] | Freq: Every day | SUBCUTANEOUS | Status: DC
  Administered 2017-06-21: 2 [IU] via SUBCUTANEOUS

## 2017-06-20 MED ORDER — INSULIN GLARGINE 100 UNIT/ML ~~LOC~~ SOLN
14.0000 [IU] | Freq: Every day | SUBCUTANEOUS | Status: DC
  Administered 2017-06-20 – 2017-06-22 (×3): 14 [IU] via SUBCUTANEOUS
  Filled 2017-06-20 (×4): qty 0.14

## 2017-06-20 MED ORDER — INSULIN ASPART 100 UNIT/ML ~~LOC~~ SOLN
4.0000 [IU] | Freq: Three times a day (TID) | SUBCUTANEOUS | Status: DC
  Administered 2017-06-21 – 2017-06-22 (×5): 4 [IU] via SUBCUTANEOUS

## 2017-06-20 MED ORDER — CEFTRIAXONE SODIUM 1 G IJ SOLR
1.0000 g | Freq: Once | INTRAMUSCULAR | Status: AC
  Administered 2017-06-20: 1 g via INTRAMUSCULAR
  Filled 2017-06-20 (×2): qty 10

## 2017-06-20 MED ORDER — SODIUM CHLORIDE 0.9 % IV SOLN
INTRAVENOUS | Status: DC
  Administered 2017-06-20: 14:00:00 via INTRAVENOUS

## 2017-06-20 MED ORDER — LINAGLIPTIN 5 MG PO TABS
5.0000 mg | ORAL_TABLET | Freq: Every day | ORAL | Status: DC
Start: 2017-06-20 — End: 2017-06-22
  Administered 2017-06-20 – 2017-06-22 (×3): 5 mg via ORAL
  Filled 2017-06-20 (×3): qty 1

## 2017-06-20 MED ORDER — SODIUM CHLORIDE 0.9% FLUSH
10.0000 mL | Freq: Two times a day (BID) | INTRAVENOUS | Status: DC
  Administered 2017-06-20 – 2017-06-22 (×4): 10 mL

## 2017-06-20 NOTE — Progress Notes (Signed)
PROGRESS NOTE    [name]  QQV:[id number]  DOB: 1940/07/19  DOA: 06/18/2017 PCP: [name], MD   Brief Admission Hx: [name]  is a 77 y.o. female, history of diabetes mellitus, atrial fibrillation, came to hospital with altered mental status..  Denies chest pain or shortness of breath.  No nausea vomiting or diarrhea.  She was noted to be septic.   MDM/Assessment & Plan:   1. Sepsis-due to UTI, initially started on vancomycin and Azactam.  Now on ceftriaxone.  Follow urine culture and blood culture results. 2. Diabetes mellitus - Continue sliding scale insulin with NovoLog.  Added tradjenta.   3. Hypertension-continue home medications including Catapres, amlodipine  4. Dementia-no behavioral disturbance, continue Aricept    DVT Prophylaxis-   Pradaxa  Subjective: Pt says that she is eating better and has more appetite today.  She is eating breakfast well.   Objective: Vitals:   06/19/17 1205 06/19/17 1711 06/19/17 2115 06/20/17 0605  BP:  (!) 144/89 (!) 149/90 (!) 156/83  Pulse:  85 85 80  Resp:  20 20 20   Temp: 98.4 F (36.9 C) 98 F (36.7 C) 98.2 F (36.8 C) 98.8 F (37.1 C)  TempSrc: Axillary Oral Oral Oral  SpO2:  97% 99% 98%  Weight:      Height:        Intake/Output Summary (Last 24 hours) at 06/20/2017 3329 Last data filed at 06/20/2017 5188 Gross per 24 hour  Intake 980 ml  Output 3000 ml  Net -2020 ml   Filed Weights   06/18/17 2249 06/18/17 2346 06/19/17 0610  Weight: 117.9 kg (260 lb) 98 kg (216 lb) 100.2 kg (220 lb 14.4 oz)     REVIEW OF SYSTEMS  As per history otherwise all reviewed and reported negative  Exam:  General exam: awake, alert, eating breakfast. NAD. Cooperative.  Respiratory system: No increased work of breathing. Cardiovascular system: S1 & S2 heard. No JVD, murmurs, gallops, clicks or pedal edema. Gastrointestinal system: Abdomen is nondistended, soft and nontender. Normal bowel sounds heard. Central  nervous system: Alert. No focal neurological deficits. Extremities: no CCE.  Data Reviewed: Basic Metabolic Panel: Recent Labs  Lab 06/19/17 0010 06/19/17 0647 06/20/17 0550  NA 133* 135 134*  K 3.6 3.2* 4.1  CL 100* 104 104  CO2 22 21* 23  GLUCOSE 341* 254* 185*  BUN 13 11 17   CREATININE 1.31* 1.09* 1.25*  CALCIUM 8.9 8.7* 8.7*   Liver Function Tests: Recent Labs  Lab 06/19/17 0010 06/19/17 0647 06/20/17 0550  AST 17 15 14*  ALT 19 19 17   ALKPHOS 98 99 88  BILITOT 0.5 0.5 0.5  PROT 7.5 7.5 7.0  ALBUMIN 3.3* 3.2* 2.9*   No results for input(s): LIPASE, AMYLASE in the last 168 hours. No results for input(s): AMMONIA in the last 168 hours. CBC: Recent Labs  Lab 06/19/17 0010 06/19/17 0647 06/20/17 0550  WBC 17.6* 16.0* 10.7*  NEUTROABS 14.4  --   --   HGB 10.8* 10.7* 10.4*  HCT 33.0* 32.8* 32.7*  MCV 83.1 82.2 83.4  PLT 290 274 251   Cardiac Enzymes: No results for input(s): CKTOTAL, CKMB, CKMBINDEX, TROPONINI in the last 168 hours. CBG (last 3)  Recent Labs    06/19/17 1708 06/19/17 2217 06/20/17 0739  GLUCAP 290* 200* 181*   Recent Results (from the past 240 hour(s))  Blood Culture (routine x 2)     Status: None (Preliminary result)   Collection Time: 06/19/17  12:10 AM  Result Value Ref Range Status   Specimen Description BLOOD  Final   Special Requests NONE  Final   Culture   Final    NO GROWTH 1 DAY Performed at [facility], [address], [place], [place] [address]    Report Status PENDING  Incomplete  Urine culture     Status: Abnormal (Preliminary result)   Collection Time: 06/19/17 12:50 AM  Result Value Ref Range Status   Specimen Description   Final    URINE, CATHETERIZED Performed at [facility], [address], [place], [place] [address]    Special Requests   Final    NONE Performed at [facility], [address] [place], [place] [address]    Culture 20,000 COLONIES/mL GRAM NEGATIVE RODS (A)  Final   Report Status PENDING   Incomplete  Blood Culture (routine x 2)     Status: None (Preliminary result)   Collection Time: 06/19/17  1:24 AM  Result Value Ref Range Status   Specimen Description RIGHT ANTECUBITAL  Final   Special Requests   Final    BOTTLES DRAWN AEROBIC AND ANAEROBIC Blood Culture adequate volume   Culture   Final    NO GROWTH 1 DAY Performed at [facility], [address], [place], [place] [address]    Report Status PENDING  Incomplete     Studies: Dg Chest Port 1 View  Result Date: 06/18/2017 CLINICAL DATA:  Fever EXAM: PORTABLE CHEST 1 VIEW COMPARISON:  08/13/2016 FINDINGS: No focal airspace disease. Mild cardiomegaly with aortic atherosclerosis. No pneumothorax. IMPRESSION: No active disease.  Borderline to mild cardiomegaly. Electronically Signed   By: Donavan Foil M.D.   On: 06/18/2017 23:30     Scheduled Meds: . amLODipine  10 mg Oral Daily  . atorvastatin  40 mg Oral Daily  . brimonidine  1 drop Both Eyes BID   And  . timolol  1 drop Both Eyes BID  . busPIRone  7.5 mg Oral TID  . cloNIDine  0.2 mg Oral BID  . dabigatran  150 mg Oral Q12H  . donepezil  10 mg Oral QHS  . gabapentin  300 mg Oral QHS  . guaiFENesin  1,200 mg Oral BID  . insulin aspart  0-9 Units Subcutaneous TID WC  . pantoprazole  40 mg Oral Daily  . propranolol  80 mg Oral Daily  . temazepam  15 mg Oral QHS  . topiramate  100 mg Oral BID   Continuous Infusions: . cefTRIAXone (ROCEPHIN)  IV Stopped (06/19/17 1715)    Active Problems:   Sepsis ([place])   Time spent:   Irwin Brakeman, MD, FAAFP [facility] Pager [phone]  If 7PM-7AM, please contact night-coverage [email or web address] Password [email or web address] 06/20/2017, 9:52 AM    LOS: 1 day

## 2017-06-20 NOTE — Progress Notes (Addendum)
Inpatient Diabetes Program Recommendations  AACE/ADA: New Consensus Statement on Inpatient Glycemic Control (2015)  Target Ranges:  Prepandial:   less than 140 mg/dL      Peak postprandial:   less than 180 mg/dL (1-2 hours)      Critically ill patients:  140 - 180 mg/dL   Results for SHERLIN, SONIER (MRN [id number]) as of 06/20/2017 07:59  Ref. Range 06/19/2017 08:20 06/19/2017 11:46 06/19/2017 17:08 06/19/2017 22:17 06/20/2017 07:39  Glucose-Capillary Latest Ref Range: 65 - 99 mg/dL 249 (H) 279 (H) 290 (H) 200 (H) 181 (H)   Results for CHANLEY, MCENERY (MRN [id number]) as of 06/20/2017 07:59  Ref. Range 04/12/2017 11:56 06/19/2017 06:47  Hemoglobin A1C Latest Ref Range: 4.8 - 5.6 % 8.4 (H) 10.0 (H)   Review of Glycemic Control  Diabetes history: DM2 Outpatient Diabetes medications: Metformin 500 mg BID Current orders for Inpatient glycemic control: Novolog 0-9 units TID with meals  Inpatient Diabetes Program Recommendations: Oral Agents: May want to consider ordering Tradjenta 5 mg daily to determine effect on glycemic control. Insulin-Correction: Please consider ordering Novolog 0-5 units QHS for bedtime correction. HgbA1C: A1C 10% on 06/19/17 indicating an average glucose of 240 mg/dl over the past 2-3 months. Patient needs additional DM medications to improve DM control.  NOTE: In reviewing chart, noted initial glucose 368 mg/dl on 06/18/17. Patient received Prednisone 20 mg at 8:22 on 06/18/17 which is contributing to hyperglycemia. No other steroids ordered at this time.  Addendum 06/20/17@12 :15-Spoke with patient and one of her sisters about diabetes and home regimen for diabetes control. Patient reports that she is followed by PCP for diabetes management and she is uncertain about what she is taking at home for DM. Patient states that her daughter puts her medication out for her and her sister [name], not currently here at the hospital) helps her with checking her glucose and takes her to her doctor  appointments.  Patient is uncertain what her glucose has been running at home because her family has been checking it for her.  Patient's sister that is in the room called her other sister [name] who helps patient at home. [name] states that patient is taking Metformin 500 mg BID and that she has just recently (Saturday) found patient's glucometer and started checking her glucose. [name] reports that her glucose ranged from 300-400 mg/dl at home prior to coming to the hospital.  [name] states that she takes patient to her doctor appointments and she had an appointment with Dr. Moshe Cipro last week but it was canceled because Dr. Moshe Cipro had a emergency and was not in the office. [name] reports that she also has DM and takes Metformin herself so she has knowledge about DM.  Discussed patient's A1C results (10% on 06/19/17) and explained that her current A1C indicates an average glucose of 240 mg/dl over the past 2-3 months. Discussed glucose and A1C goals. Discussed importance of checking CBGs and maintaining good CBG control to prevent long-term and short-term complications.  Stressed to the patient the importance of improving glycemic control to prevent further complications from uncontrolled diabetes. Explained that patient likely needs additional DM medications to improve DM control. Encouraged patient and her family to check her glucose 1-2 times per day and to take glucometer with her to doctor appointments.  Patient and her sisters verbalized understanding of information discussed and they state that they have no further questions at this time related to diabetes.  Thanks, Barnie Alderman, RN, MSN, CDE Diabetes Coordinator Inpatient Diabetes  Program [phone] (Team Pager from 8am to [place])

## 2017-06-20 NOTE — Progress Notes (Signed)
Vascular wellness returned call. Midline 10cm long. Midline seems to be positional. No blood return but midline will flush. Vascular wellness advised pulling back on catheter to get blood return. Continue to monitor.

## 2017-06-20 NOTE — Progress Notes (Signed)
06/20/2017 5:41 PM  Called to see patient regarding fever 100.2 and report that patient has taken a turn for the worse.  I came to see patient and found her to be in no acute distress.  She was able to answer all of my questions appropriately.  Her chest xray did not show any acute findings.  She has again lost IV access after the midline was placed earlier today.  Therefore, will order ceftriaxone to be given IM x 1 dose today and hopefully can resume IV ceftriaxone tomorrow. She had some family at bedside who I updated.   I personally reviewed CXR and found no acute findings.  IV fluids have been discontinued due to no IV access.  Urine culture positive for E coli.  Blood cultures no growth to date.   Exam:  General: Awake, alert, orientedx3, NAD HEENT: dry MM Neck: supple, no JVD, no thyromegaly Chest: no abnormal excursions Lungs: BBS shallow but clear to auscultation CV: normal s1, s2 sounds.   Abd: soft, nondistended, nontender,no masses Ext: no cyanosis or clubbing.  Neurological: no focal findings.   Vitals:   06/20/17 1510 06/20/17 1549  BP: (!) 148/72 (!) 160/87  Pulse: 84 85  Resp: (!) 21   Temp: 98.6 F (37 C) 100.2 F (37.9 C)  SpO2: 94% 98%      Results for orders placed or performed during the hospital encounter of 06/18/17  Blood Culture (routine x 2)  Result Value Ref Range   Specimen Description BLOOD RIGHT ARM    Special Requests      BOTTLES DRAWN AEROBIC ONLY Blood Culture adequate volume   Culture      NO GROWTH 1 DAY Performed at [facility], [address] [place], [place] [address]    Report Status PENDING   Blood Culture (routine x 2)  Result Value Ref Range   Specimen Description RIGHT ANTECUBITAL    Special Requests      BOTTLES DRAWN AEROBIC AND ANAEROBIC Blood Culture adequate volume   Culture      NO GROWTH 1 DAY Performed at [facility], [address] [place], [place] [address]    Report Status PENDING   Urine culture  Result Value Ref  Range   Specimen Description      URINE, CATHETERIZED Performed at [facility], [address] [place], [place] [address]    Special Requests      NONE Performed at [facility], [address], [place], [place] [address]    Culture 20,000 COLONIES/mL ESCHERICHIA COLI (A)    Report Status PENDING   Urinalysis, Routine w reflex microscopic  Result Value Ref Range   Color, Urine STRAW (A) YELLOW   APPearance CLEAR CLEAR   Specific Gravity, Urine 1.008 1.005 - 1.030   pH 7.0 5.0 - 8.0   Glucose, UA >=500 (A) NEGATIVE mg/dL   Hgb urine dipstick SMALL (A) NEGATIVE   Bilirubin Urine NEGATIVE NEGATIVE   Ketones, ur NEGATIVE NEGATIVE mg/dL   Protein, ur 30 (A) NEGATIVE mg/dL   Nitrite NEGATIVE NEGATIVE   Leukocytes, UA LARGE (A) NEGATIVE   RBC / HPF 0-5 0 - 5 RBC/hpf   WBC, UA >50 (H) 0 - 5 WBC/hpf   Bacteria, UA RARE (A) NONE SEEN   WBC Clumps PRESENT   Comprehensive metabolic panel  Result Value Ref Range   Sodium 133 (L) 135 - 145 mmol/L   Potassium 3.6 3.5 - 5.1 mmol/L   Chloride 100 (L) 101 - 111  mmol/L   CO2 22 22 - 32 mmol/L   Glucose, Bld 341 (H) 65 - 99 mg/dL   BUN 13 6 - 20 mg/dL   Creatinine, Ser 1.31 (H) 0.44 - 1.00 mg/dL   Calcium 8.9 8.9 - 10.3 mg/dL   Total Protein 7.5 6.5 - 8.1 g/dL   Albumin 3.3 (L) 3.5 - 5.0 g/dL   AST 17 15 - 41 U/L   ALT 19 14 - 54 U/L   Alkaline Phosphatase 98 38 - 126 U/L   Total Bilirubin 0.5 0.3 - 1.2 mg/dL   GFR calc non Af Amer 38 (L) >60 mL/min   GFR calc Af Amer 44 (L) >60 mL/min   Anion gap 11 5 - 15  CBC WITH DIFFERENTIAL  Result Value Ref Range   WBC 17.6 (H) 4.0 - 10.5 K/uL   RBC 3.97 3.87 - 5.11 MIL/uL   Hemoglobin 10.8 (L) 12.0 - 15.0 g/dL   HCT 33.0 (L) 36.0 - 46.0 %   MCV 83.1 78.0 - 100.0 fL   MCH 27.2 26.0 - 34.0 pg   MCHC 32.7 30.0 - 36.0 g/dL   RDW 14.3 11.5 - 15.5 %   Platelets 290 150 - 400 K/uL   Neutrophils Relative % 82 %   Neutro Abs 14.4 1.7 - 7.7 K/uL   Lymphocytes Relative 9 %   Lymphs Abs 1.6 0.7 -  4.0 K/uL   Monocytes Relative 9 %   Monocytes Absolute 1.6 (H) 0.1 - 1.0 K/uL   Eosinophils Relative 0 %   Eosinophils Absolute 0.0 0.0 - 0.7 K/uL   Basophils Relative 0 %   Basophils Absolute 0.0 0.0 - 0.1 K/uL  Protime-INR  Result Value Ref Range   Prothrombin Time 14.0 11.4 - 15.2 seconds   INR 1.09   Hemoglobin A1c  Result Value Ref Range   Hgb A1c MFr Bld 10.0 (H) 4.8 - 5.6 %   Mean Plasma Glucose 240.3 mg/dL  CBC  Result Value Ref Range   WBC 16.0 (H) 4.0 - 10.5 K/uL   RBC 3.99 3.87 - 5.11 MIL/uL   Hemoglobin 10.7 (L) 12.0 - 15.0 g/dL   HCT 32.8 (L) 36.0 - 46.0 %   MCV 82.2 78.0 - 100.0 fL   MCH 26.8 26.0 - 34.0 pg   MCHC 32.6 30.0 - 36.0 g/dL   RDW 14.4 11.5 - 15.5 %   Platelets 274 150 - 400 K/uL  Comprehensive metabolic panel  Result Value Ref Range   Sodium 135 135 - 145 mmol/L   Potassium 3.2 (L) 3.5 - 5.1 mmol/L   Chloride 104 101 - 111 mmol/L   CO2 21 (L) 22 - 32 mmol/L   Glucose, Bld 254 (H) 65 - 99 mg/dL   BUN 11 6 - 20 mg/dL   Creatinine, Ser 1.09 (H) 0.44 - 1.00 mg/dL   Calcium 8.7 (L) 8.9 - 10.3 mg/dL   Total Protein 7.5 6.5 - 8.1 g/dL   Albumin 3.2 (L) 3.5 - 5.0 g/dL   AST 15 15 - 41 U/L   ALT 19 14 - 54 U/L   Alkaline Phosphatase 99 38 - 126 U/L   Total Bilirubin 0.5 0.3 - 1.2 mg/dL   GFR calc non Af Amer 48 (L) >60 mL/min   GFR calc Af Amer 55 (L) >60 mL/min   Anion gap 10 5 - 15  Glucose, capillary  Result Value Ref Range   Glucose-Capillary 249 (H) 65 - 99 mg/dL  Glucose, capillary  Result Value Ref Range   Glucose-Capillary 279 (H) 65 - 99 mg/dL  Glucose, capillary  Result Value Ref Range   Glucose-Capillary 290 (H) 65 - 99 mg/dL  CBC  Result Value Ref Range   WBC 10.7 (H) 4.0 - 10.5 K/uL   RBC 3.92 3.87 - 5.11 MIL/uL   Hemoglobin 10.4 (L) 12.0 - 15.0 g/dL   HCT 32.7 (L) 36.0 - 46.0 %   MCV 83.4 78.0 - 100.0 fL   MCH 26.5 26.0 - 34.0 pg   MCHC 31.8 30.0 - 36.0 g/dL   RDW 14.7 11.5 - 15.5 %   Platelets 251 150 - 400 K/uL   Comprehensive metabolic panel  Result Value Ref Range   Sodium 134 (L) 135 - 145 mmol/L   Potassium 4.1 3.5 - 5.1 mmol/L   Chloride 104 101 - 111 mmol/L   CO2 23 22 - 32 mmol/L   Glucose, Bld 185 (H) 65 - 99 mg/dL   BUN 17 6 - 20 mg/dL   Creatinine, Ser 1.25 (H) 0.44 - 1.00 mg/dL   Calcium 8.7 (L) 8.9 - 10.3 mg/dL   Total Protein 7.0 6.5 - 8.1 g/dL   Albumin 2.9 (L) 3.5 - 5.0 g/dL   AST 14 (L) 15 - 41 U/L   ALT 17 14 - 54 U/L   Alkaline Phosphatase 88 38 - 126 U/L   Total Bilirubin 0.5 0.3 - 1.2 mg/dL   GFR calc non Af Amer 40 (L) >60 mL/min   GFR calc Af Amer 47 (L) >60 mL/min   Anion gap 7 5 - 15  Glucose, capillary  Result Value Ref Range   Glucose-Capillary 200 (H) 65 - 99 mg/dL   Comment 1 Notify RN    Comment 2 Document in Chart   Glucose, capillary  Result Value Ref Range   Glucose-Capillary 181 (H) 65 - 99 mg/dL  Glucose, capillary  Result Value Ref Range   Glucose-Capillary 336 (H) 65 - 99 mg/dL  Glucose, capillary  Result Value Ref Range   Glucose-Capillary 240 (H) 65 - 99 mg/dL  CBG monitoring, ED  Result Value Ref Range   Glucose-Capillary 368 (H) 65 - 99 mg/dL  I-Stat CG4 Lactic Acid, ED  (not at  [facility])  Result Value Ref Range   Lactic Acid, Venous 2.31 (HH) 0.5 - 1.9 mmol/L  I-Stat CG4 Lactic Acid, ED  (not at  [facility])  Result Value Ref Range   Lactic Acid, Venous 1.19 0.5 - 1.9 mmol/L    [facility]

## 2017-06-21 ENCOUNTER — Other Ambulatory Visit: Payer: Self-pay | Admitting: Family Medicine

## 2017-06-21 LAB — GLUCOSE, CAPILLARY
GLUCOSE-CAPILLARY: 253 mg/dL — AB (ref 65–99)
Glucose-Capillary: 183 mg/dL — ABNORMAL HIGH (ref 65–99)
Glucose-Capillary: 185 mg/dL — ABNORMAL HIGH (ref 65–99)
Glucose-Capillary: 300 mg/dL — ABNORMAL HIGH (ref 65–99)
Glucose-Capillary: 166 mg/dL — ABNORMAL HIGH (ref 65–99)

## 2017-06-21 LAB — COMPREHENSIVE METABOLIC PANEL WITH GFR
ALT: 21 U/L (ref 14–54)
AST: 19 U/L (ref 15–41)
Albumin: 2.8 g/dL — ABNORMAL LOW (ref 3.5–5.0)
Alkaline Phosphatase: 93 U/L (ref 38–126)
Anion gap: 11 (ref 5–15)
BUN: 20 mg/dL (ref 6–20)
CO2: 21 mmol/L — ABNORMAL LOW (ref 22–32)
Calcium: 8.7 mg/dL — ABNORMAL LOW (ref 8.9–10.3)
Chloride: 102 mmol/L (ref 101–111)
Creatinine, Ser: 1.27 mg/dL — ABNORMAL HIGH (ref 0.44–1.00)
GFR calc Af Amer: 46 mL/min — ABNORMAL LOW
GFR calc non Af Amer: 40 mL/min — ABNORMAL LOW
Glucose, Bld: 187 mg/dL — ABNORMAL HIGH (ref 65–99)
Potassium: 3.9 mmol/L (ref 3.5–5.1)
Sodium: 134 mmol/L — ABNORMAL LOW (ref 135–145)
Total Bilirubin: 0.4 mg/dL (ref 0.3–1.2)
Total Protein: 7.2 g/dL (ref 6.5–8.1)

## 2017-06-21 LAB — URINE CULTURE: Culture: 20000 — AB

## 2017-06-21 LAB — CBC WITH DIFFERENTIAL/PLATELET
BASOS PCT: 0 %
Basophils Absolute: 0 10*3/uL (ref 0.0–0.1)
EOS ABS: 0 10*3/uL (ref 0.0–0.7)
Eosinophils Relative: 0 %
HCT: 34 % — ABNORMAL LOW (ref 36.0–46.0)
HEMOGLOBIN: 10.9 g/dL — AB (ref 12.0–15.0)
Lymphocytes Relative: 22 %
Lymphs Abs: 1.8 10*3/uL (ref 0.7–4.0)
MCH: 26.6 pg (ref 26.0–34.0)
MCHC: 32.1 g/dL (ref 30.0–36.0)
MCV: 82.9 fL (ref 78.0–100.0)
Monocytes Absolute: 1 10*3/uL (ref 0.1–1.0)
Monocytes Relative: 12 %
NEUTROS ABS: 5.3 10*3/uL (ref 1.7–7.7)
NEUTROS PCT: 66 %
PLATELETS: 300 10*3/uL (ref 150–400)
RBC: 4.1 MIL/uL (ref 3.87–5.11)
RDW: 14.6 % (ref 11.5–15.5)
WBC: 8.1 10*3/uL (ref 4.0–10.5)

## 2017-06-21 LAB — MAGNESIUM: Magnesium: 1.8 mg/dL (ref 1.7–2.4)

## 2017-06-21 MED ORDER — SODIUM CHLORIDE 0.9 % IV BOLUS
500.0000 mL | Freq: Once | INTRAVENOUS | Status: AC
  Administered 2017-06-21: 500 mL via INTRAVENOUS

## 2017-06-21 NOTE — Plan of Care (Signed)
  Problem: Acute Rehab PT Goals(only PT should resolve) Goal: Pt Will Go Supine/Side To Sit Outcome: Progressing Flowsheets (Taken 06/21/2017 1119) Pt will go Supine/Side to Sit: Independently Goal: Patient Will Transfer Sit To/From Stand Outcome: Progressing Flowsheets (Taken 06/21/2017 1119) Patient will transfer sit to/from stand: with modified independence Goal: Pt Will Transfer Bed To Chair/Chair To Bed Outcome: Progressing Flowsheets (Taken 06/21/2017 1119) Pt will Transfer Bed to Chair/Chair to Bed: with supervision Goal: Pt Will Ambulate Outcome: Progressing Flowsheets (Taken 06/21/2017 1119) Pt will Ambulate: 50 feet;with supervision;with rolling walker   11:20 AM, 06/21/17 Lonell Grandchild, MPT Physical Therapist with [facility] [phone] office [phone] mobile phone

## 2017-06-21 NOTE — Evaluation (Signed)
Physical Therapy Evaluation Patient Details Name: [name] MRN: [id number] DOB: 1940/10/10 Today's Date: 06/21/2017   History of Present Illness   [name]  is a 77 y.o. female, history of diabetes mellitus, atrial fibrillation, came to hospital with altered mental status.  As per daughter patient has been confused since yesterday.  Denies chest pain or shortness of breath.  No nausea vomiting or diarrhea.    Clinical Impression  Patient limited for functional mobility as stated below secondary to SOB when walking, generalized weakness, fatigue and fair/poor standing balance.  Patient will benefit from continued physical therapy in hospital and recommended venue below to increase strength, balance, endurance for safe ADLs and gait.    Follow Up Recommendations Home health PT;Supervision for mobility/OOB    Equipment Recommendations  None recommended by PT    Recommendations for Other Services       Precautions / Restrictions Precautions Precautions: Fall Restrictions Weight Bearing Restrictions: No      Mobility  Bed Mobility Overal bed mobility: Modified Independent Bed Mobility: Supine to Sit;Sit to Supine     Supine to sit: Modified independent (Device/Increase time) Sit to supine: Modified independent (Device/Increase time)   General bed mobility comments: increased time  Transfers Overall transfer level: Needs assistance Equipment used: Rolling walker (2 wheeled) Transfers: Sit to/from [facility] Sit to Stand: Supervision Stand pivot transfers: Min guard          Ambulation/Gait Ambulation/Gait assistance: Min guard Ambulation Distance (Feet): 35 Feet Assistive device: Rolling walker (2 wheeled) Gait Pattern/deviations: Decreased step length - right;Decreased step length - left;Decreased stride length Gait velocity: decreased   General Gait Details: demonstrates slow slightly labored cadence, no loss of balance, mostly limited due  to SOB  Stairs            Wheelchair Mobility    Modified Rankin (Stroke Patients Only)       Balance Overall balance assessment: Needs assistance Sitting-balance support: Feet supported;No upper extremity supported Sitting balance-Leahy Scale: Good     Standing balance support: Bilateral upper extremity supported;During functional activity Standing balance-Leahy Scale: Fair                               Pertinent Vitals/Pain Pain Assessment: 0-10 Pain Score: 8  Pain Location: posterior neck Pain Descriptors / Indicators: Aching Pain Intervention(s): Limited activity within patient's tolerance;Monitored during session    [place] expects to be discharged to:: Private residence Living Arrangements: Children(daughter) Available Help at Discharge: Family Type of Home: House Home Access: Stairs to enter Entrance Stairs-Rails: Right;Left;Can reach both Entrance Stairs-Number of Steps: 5 Home Layout: One level Home Equipment: Environmental consultant - 2 wheels;Cane - single point;Shower seat;Bedside commode;Wheelchair - manual      Prior Function Level of Independence: Needs assistance   Gait / Transfers Assistance Needed: Household and short distanced community ambulator with RW or SPC  ADL's / Homemaking Assistance Needed: assisted by her daughter        Hand Dominance   Dominant Hand: Right    Extremity/Trunk Assessment   Upper Extremity Assessment Upper Extremity Assessment: Generalized weakness    Lower Extremity Assessment Lower Extremity Assessment: Generalized weakness    Cervical / Trunk Assessment Cervical / Trunk Assessment: Normal  Communication   Communication: No difficulties  Cognition Arousal/Alertness: Awake/alert Behavior During Therapy: WFL for tasks assessed/performed Overall Cognitive Status: Within Functional Limits for tasks assessed  General Comments       Exercises     Assessment/Plan    PT Assessment Patient needs continued PT services  PT Problem List Decreased strength;Decreased activity tolerance;Decreased balance;Decreased mobility       PT Treatment Interventions Gait training;Functional mobility training;Stair training;Therapeutic activities;Therapeutic exercise;Patient/family education    PT Goals (Current goals can be found in the Care Plan section)  Acute Rehab PT Goals Patient Stated Goal: return home with family to assist PT Goal Formulation: With patient/family Time For Goal Achievement: 06/25/17 Potential to Achieve Goals: Good    Frequency Min 3X/week   Barriers to discharge        Co-evaluation               AM-PAC PT "6 Clicks" Daily Activity  Outcome Measure Difficulty turning over in bed (including adjusting bedclothes, sheets and blankets)?: None Difficulty moving from lying on back to sitting on the side of the bed? : None Difficulty sitting down on and standing up from a chair with arms (e.g., wheelchair, bedside commode, etc,.)?: None Help needed moving to and from a bed to chair (including a wheelchair)?: A Little Help needed walking in hospital room?: A Little Help needed climbing 3-5 steps with a railing? : A Little 6 Click Score: 21    End of Session   Activity Tolerance: Patient tolerated treatment well;Patient limited by fatigue(Patient limited by SOB) Patient left: in bed;with call bell/phone within reach;with bed alarm set;with family/visitor present Nurse Communication: Mobility status PT Visit Diagnosis: Unsteadiness on feet (R26.81);Other abnormalities of gait and mobility (R26.89);Muscle weakness (generalized) (M62.81)    Time: 1194-1740 PT Time Calculation (min) (ACUTE ONLY): 27 min   Charges:   PT Evaluation $PT Eval Moderate Complexity: 1 Mod PT Treatments $Therapeutic Activity: 23-37 mins   PT G Codes:        11:17 AM, July 17, 2017 Lonell Grandchild, MPT Physical  Therapist with [facility] [phone] office [phone] mobile phone

## 2017-06-21 NOTE — Telephone Encounter (Signed)
Patient currently admitted

## 2017-06-21 NOTE — Care Management Note (Signed)
Case Management Note  Patient Details  Name: [name] MRN: [id number] Date of Birth: [birth year]  Subjective/Objective:       Admitted with sepsis. Pt from home, lives with daughter and SIL. Pt uses a RW with ambulation. She is ind with bathing and dressing. Her daughter helps manage medications. Her sister takes her to appointments. She is on the [facility] registry but not active with services. This is her first admission in last 6 months.   Action/Plan: Home with [facility]. Pt agreeable. Has chosen [facility] from list of [facility]. Aware HH has 48 hrs to make first visit. Juliann Pulse, [facility] rep, aware of referral and will pull pt info from chart. CM will follow to DC.   Expected Discharge Date:       06/22/17           Expected Discharge Plan:  [place]  In-House Referral:  NA  Discharge planning Services  CM Consult  Post Acute Care Choice:  Home Health Choice offered to:  Patient  HH Arranged:  RN, PT [facility] Agency:  [place]  Status of Service:  In process, will continue to follow  If discussed at Long Length of Stay Meetings, dates discussed:    Additional Comments:  Sherald Barge, RN 06/21/2017, 12:20 PM

## 2017-06-21 NOTE — Progress Notes (Addendum)
PROGRESS NOTE    [name]  TIR:[id number]  DOB: Feb 03, 1941  DOA: 06/18/2017 PCP: [name], MD   Brief Admission Hx: [name]  is a 77 y.o. female, history of diabetes mellitus, atrial fibrillation, came to hospital with altered mental status..  Denies chest pain or shortness of breath.  No nausea vomiting or diarrhea.  She was noted to be septic.   MDM/Assessment & Plan:   1. Sepsis-due to E coli UTI, initially started on vancomycin and Azactam.  Now on ceftriaxone.  Follow urine culture and blood culture results. 2. Diabetes mellitus type 2 - Continue sliding scale insulin with NovoLog.  Added tradjenta.  BS only slightly improved. See new orders.  3. Hypertension-continue home medications including Catapres, amlodipine  4. Dementia-no behavioral disturbance, continue Aricept, Family at bedside.  5. AKI - pt is dehydrated, will give some IVFs today.   6. Hypokalemia - repleted.  7. Metabolic encephalopathy - resolved 8. obesity   DVT Prophylaxis-   Pradaxa Disp: Possible DC 5/8, PT eval pending.   Subjective: Pt says that she slept well but feels pain back of neck from sleeping position and arthritis. Pt says she wants to eat some breakfast.   Objective: Vitals:   06/20/17 2030 06/20/17 2055 06/21/17 0420 06/21/17 1010  BP:  (!) 142/78 (!) 146/75 140/62  Pulse:  73 72 78  Resp:  20    Temp:  98.5 F (36.9 C) 98.5 F (36.9 C)   TempSrc:  Oral Oral   SpO2: 91% 94% 98%   Weight:      Height:        Intake/Output Summary (Last 24 hours) at 06/21/2017 1029 Last data filed at 06/21/2017 6761 Gross per 24 hour  Intake 1126 ml  Output 1350 ml  Net -224 ml   Filed Weights   06/18/17 2249 06/18/17 2346 06/19/17 0610  Weight: 117.9 kg (260 lb) 98 kg (216 lb) 100.2 kg (220 lb 14.4 oz)   REVIEW OF SYSTEMS  As per history otherwise all reviewed and reported negative  Exam:  General exam: awake, alert, oriented x 3. NAD. Cooperative.  Respiratory  system: No increased work of breathing. Cardiovascular system: S1 & S2 heard. No JVD, murmurs, gallops, clicks or pedal edema. Gastrointestinal system: Abdomen is nondistended, soft and nontender. Normal bowel sounds heard. Central nervous system: Alert. No focal neurological deficits. Extremities: no CCE.  Data Reviewed: Basic Metabolic Panel: Recent Labs  Lab 06/19/17 0010 06/19/17 0647 06/20/17 0550 06/21/17 0540  NA 133* 135 134* 134*  K 3.6 3.2* 4.1 3.9  CL 100* 104 104 102  CO2 22 21* 23 21*  GLUCOSE 341* 254* 185* 187*  BUN 13 11 17 20   CREATININE 1.31* 1.09* 1.25* 1.27*  CALCIUM 8.9 8.7* 8.7* 8.7*  MG  --   --   --  1.8   Liver Function Tests: Recent Labs  Lab 06/19/17 0010 06/19/17 0647 06/20/17 0550 06/21/17 0540  AST 17 15 14* 19  ALT 19 19 17 21   ALKPHOS 98 99 88 93  BILITOT 0.5 0.5 0.5 0.4  PROT 7.5 7.5 7.0 7.2  ALBUMIN 3.3* 3.2* 2.9* 2.8*   No results for input(s): LIPASE, AMYLASE in the last 168 hours. No results for input(s): AMMONIA in the last 168 hours. CBC: Recent Labs  Lab 06/19/17 0010 06/19/17 0647 06/20/17 0550 06/21/17 0540  WBC 17.6* 16.0* 10.7* 8.1  NEUTROABS 14.4  --   --  5.3  HGB 10.8* 10.7* 10.4*  10.9*  HCT 33.0* 32.8* 32.7* 34.0*  MCV 83.1 82.2 83.4 82.9  PLT 290 274 251 300   Cardiac Enzymes: No results for input(s): CKTOTAL, CKMB, CKMBINDEX, TROPONINI in the last 168 hours. CBG (last 3)  Recent Labs    06/20/17 2201 06/21/17 0536 06/21/17 0736  GLUCAP 179* 183* 185*   Recent Results (from the past 240 hour(s))  Blood Culture (routine x 2)     Status: None (Preliminary result)   Collection Time: 06/19/17 12:10 AM  Result Value Ref Range Status   Specimen Description BLOOD RIGHT ARM  Final   Special Requests   Final    BOTTLES DRAWN AEROBIC ONLY Blood Culture adequate volume   Culture   Final    NO GROWTH 2 DAYS Performed at [facility], [address] [place], [place] [address]    Report Status PENDING   Incomplete  Urine culture     Status: Abnormal   Collection Time: 06/19/17 12:50 AM  Result Value Ref Range Status   Specimen Description   Final    URINE, CATHETERIZED Performed at [facility], [address], [place], [place] [address]    Special Requests   Final    NONE Performed at [facility], [address], [place], [place] [address]    Culture 20,000 COLONIES/mL ESCHERICHIA COLI (A)  Final   Report Status 06/21/2017 FINAL  Final   Organism ID, Bacteria ESCHERICHIA COLI (A)  Final      Susceptibility   Escherichia coli - MIC*    AMPICILLIN >=32 RESISTANT Resistant     CEFAZOLIN <=4 SENSITIVE Sensitive     CEFTRIAXONE <=1 SENSITIVE Sensitive     CIPROFLOXACIN >=4 RESISTANT Resistant     GENTAMICIN >=16 RESISTANT Resistant     IMIPENEM <=0.25 SENSITIVE Sensitive     NITROFURANTOIN <=16 SENSITIVE Sensitive     TRIMETH/SULFA >=320 RESISTANT Resistant     AMPICILLIN/SULBACTAM >=32 RESISTANT Resistant     PIP/TAZO <=4 SENSITIVE Sensitive     Extended ESBL NEGATIVE Sensitive     * 20,000 COLONIES/mL ESCHERICHIA COLI  Blood Culture (routine x 2)     Status: None (Preliminary result)   Collection Time: 06/19/17  1:24 AM  Result Value Ref Range Status   Specimen Description RIGHT ANTECUBITAL  Final   Special Requests   Final    BOTTLES DRAWN AEROBIC AND ANAEROBIC Blood Culture adequate volume   Culture   Final    NO GROWTH 2 DAYS Performed at [facility], [address], [place], [place] [address]    Report Status PENDING  Incomplete     Studies: Dg Chest Port 1 View  Result Date: 06/20/2017 CLINICAL DATA:  Acute onset of fever, shortness of breath and wheezing. EXAM: PORTABLE CHEST 1 VIEW COMPARISON:  06/18/2017, 08/13/2016 and earlier. FINDINGS: Suboptimal inspiration accounts for crowded bronchovascular markings, especially in the bases, and accentuates the cardiac silhouette. Taking this into account, cardiac silhouette upper normal in size to slightly enlarged,  unchanged. Thoracic aorta atherosclerotic, unchanged. Hilar and mediastinal contours otherwise unremarkable. Mild pulmonary venous hypertension without overt edema, unchanged since the examination 2 days ago. No new pulmonary parenchymal abnormalities. IMPRESSION: Suboptimal inspiration. Stable borderline to mild cardiomegaly. No acute cardiopulmonary disease. Electronically Signed   By: Evangeline Dakin M.D.   On: 06/20/2017 17:36   Scheduled Meds: . amLODipine  10 mg Oral Daily  . atorvastatin  40 mg Oral Daily  . brimonidine  1 drop Both Eyes BID   And  .  timolol  1 drop Both Eyes BID  . busPIRone  7.5 mg Oral TID  . cloNIDine  0.2 mg Oral BID  . dabigatran  150 mg Oral Q12H  . donepezil  10 mg Oral QHS  . gabapentin  300 mg Oral QHS  . guaiFENesin  1,200 mg Oral BID  . insulin aspart  0-20 Units Subcutaneous TID WC  . insulin aspart  0-5 Units Subcutaneous QHS  . insulin aspart  4 Units Subcutaneous TID WC  . insulin glargine  14 Units Subcutaneous Daily  . linagliptin  5 mg Oral Daily  . pantoprazole  40 mg Oral Daily  . propranolol  80 mg Oral Daily  . sodium chloride flush  10-40 mL Intracatheter Q12H  . temazepam  15 mg Oral QHS  . topiramate  100 mg Oral BID   Continuous Infusions: . cefTRIAXone (ROCEPHIN)  IV Stopped (06/19/17 1715)    Active Problems:   Sepsis ([place])   Time spent:   Irwin Brakeman, MD, FAAFP [facility] Pager [phone]  If 7PM-7AM, please contact night-coverage [email or web address] Password [email or web address] 06/21/2017, 10:29 AM    LOS: 2 days

## 2017-06-22 LAB — GLUCOSE, CAPILLARY
GLUCOSE-CAPILLARY: 262 mg/dL — AB (ref 65–99)
Glucose-Capillary: 180 mg/dL — ABNORMAL HIGH (ref 65–99)
Glucose-Capillary: 165 mg/dL — ABNORMAL HIGH (ref 65–99)

## 2017-06-22 MED ORDER — LINAGLIPTIN 5 MG PO TABS: 5.0000 mg | ORAL_TABLET | Freq: Every day | ORAL | 0 refills | Status: DC

## 2017-06-22 MED ORDER — CEPHALEXIN 500 MG PO CAPS: 500.0000 mg | ORAL_CAPSULE | Freq: Three times a day (TID) | ORAL | 0 refills | Status: AC

## 2017-06-22 NOTE — Progress Notes (Signed)
Physical Therapy Note  Patient Details  Name: [name] MRN: [id number] Date of Birth: [birth year] Today's Date: 06/22/2017    Pt sitting up in bed with family member present.  Reports she is being discharged and declined treatment with physical therapy today  Teena Irani, PTA/CLT [phone]    Roseanne Reno B 06/22/2017, 9:41 AM

## 2017-06-22 NOTE — Discharge Summary (Signed)
Physician Discharge Summary  [name]:[id number] DOB: [birth year] DOA: 06/18/2017  PCP: [name], MD  Admit date: 06/18/2017 Discharge date: 06/22/2017  Admitted From: HOME  Disposition: HOME  Recommendations for Outpatient Follow-up:  1. Follow up with PCP in 1 weeks 2. Follow-up with [facility] in 2 weeks to establish care 3. Please obtain BMP/CBC in one week 4. Please follow up on the following pending results:FINAL [place]: Physical Therapy  Discharge Condition: STABLE   CODE STATUS: DNR   Brief Hospitalization Summary: Please see all hospital notes, images, labs for full details of the hospitalization. HPI:   [name]  is a 77 y.o. female, history of diabetes mellitus, atrial fibrillation, came to hospital with altered mental status.  As per daughter patient has been confused since yesterday.  Denies chest pain or shortness of breath.  No nausea vomiting or diarrhea. In the ED patient was found to have abnormal UA and started on vancomycin and Azactam for presumed sepsis. She denies chest pain or shortness of breath. No nausea, vomiting or diarrhea. She denies dysuria, urgency or frequency of urination. She does complain of pain in the lower abdominal  Brief Admission Hx: LillianCobbis a77 y.o.female,history of diabetes mellitus, atrial fibrillation, came to hospital with altered mental status.. Denies chest pain or shortness of breath. No nausea vomiting or diarrhea.  She was noted to be septic.   MDM/Assessment & Plan:   1. Sepsis-due to E coli UTI, initially started on vancomycin and Azactam. Now on ceftriaxone.  Follow urine culture and blood culture results.  Discharge home on oral cephalexin. 2. Diabetes mellitus type 2 -treated withsliding scale insulin with NovoLog. Added tradjenta.  Metformin was discontinued on admission due to renal insufficiency.  The patient will be discharged on Tradjenta and close follow-up  with outpatient PCP for further adjustment in medications.  Metformin was not restarted at discharge as her renal function and GFR remains fairly low.  She could have her renal function rechecked next week with PCP and metformin could be restarted if appropriate.  I think with her hemoglobin A1c at 10% she may benefit from starting basal insulin.  She will discuss this with PCP on follow up. 3. Hypertension-continue home medications including Catapres, amlodipine.   4. Dementia-no behavioral disturbance, continue Aricept, Family at bedside.  5. AKI - Her creatinine improved after IVFs but not back to full baseline. Follow up with PCP.   6. Hypokalemia - repleted.  7. Metabolic encephalopathy - resolved 8. obesity  DVT Prophylaxis-Pradaxa  Discharge Diagnoses:  Active Problems:   Sepsis [facility])  Discharge Instructions: Discharge Instructions    Call MD for:  difficulty breathing, headache or visual disturbances   Complete by:  As directed    Call MD for:  extreme fatigue   Complete by:  As directed    Call MD for:  hives   Complete by:  As directed    Call MD for:  persistant nausea and vomiting   Complete by:  As directed    Call MD for:  severe uncontrolled pain   Complete by:  As directed    Increase activity slowly   Complete by:  As directed      Allergies as of 06/22/2017      Reactions   Penicillins Other (See Comments)      Fentanyl Itching   Patient just started the patch on 08-08-2011 and she has noted itching, but states that it is helping.  Sulfonamide Derivatives Hives   Codeine Itching, Rash   tussionex  Is tolerated by patient, no phenergan dm       Medication List    STOP taking these medications   meclizine 25 MG tablet Commonly known as:  ANTIVERT   metFORMIN 500 MG tablet Commonly known as:  GLUCOPHAGE     TAKE these medications   ACCU-CHEK AVIVA PLUS w/Device Kit For once daily testing dx E11.9   ACCU-CHEK FASTCLIX LANCETS Misc Once daily  testing dx e11.9   acyclovir 400 MG tablet Commonly known as:  ZOVIRAX TAKE 1 TABLET (400 MG TOTAL) BY MOUTH 3 (THREE) TIMES DAILY.   amLODipine 10 MG tablet Commonly known as:  NORVASC Take 1 tablet (10 mg total) by mouth daily.   atorvastatin 40 MG tablet Commonly known as:  LIPITOR TAKE 1 TABLET (40 MG TOTAL) BY MOUTH DAILY.   busPIRone 7.5 MG tablet Commonly known as:  BUSPAR Take 1 tablet (7.5 mg total) by mouth 3 (three) times daily.   cephALEXin 500 MG capsule Commonly known as:  KEFLEX Take 1 capsule (500 mg total) by mouth 3 (three) times daily for 5 days.   cloNIDine 0.2 MG tablet Commonly known as:  CATAPRES Take 1 tablet (0.2 mg total) by mouth 2 (two) times daily.   COMBIGAN 0.2-0.5 % ophthalmic solution Generic drug:  brimonidine-timolol Place 1 drop into both eyes 2 (two) times daily.   donepezil 10 MG tablet Commonly known as:  ARICEPT Take 1 tablet (10 mg total) by mouth at bedtime.   glucose blood test strip Commonly known as:  ONE TOUCH ULTRA TEST USE TO TEST DAILY   hydrOXYzine 25 MG tablet Commonly known as:  ATARAX/VISTARIL One tablet at bedtime   imipramine 50 MG tablet Commonly known as:  TOFRANIL Take 2 tablets (100 mg total) by mouth at bedtime.   linagliptin 5 MG Tabs tablet Commonly known as:  TRADJENTA Take 1 tablet (5 mg total) by mouth daily. Start taking on:  06/23/2017   montelukast 10 MG tablet Commonly known as:  SINGULAIR TAKE 1 TABLET (10 MG TOTAL) BY MOUTH AT BEDTIME.   olopatadine 0.1 % ophthalmic solution Commonly known as:  PATANOL INSTILL 1 DROP INTO BOTH EYES TWICE A DAY   omeprazole 40 MG capsule Commonly known as:  PRILOSEC TAKE 1 CAPSULE BY MOUTH TWICE A DAY   oxyCODONE-acetaminophen 10-325 MG tablet Commonly known as:  PERCOCET Take 1 tablet by mouth 2 (two) times daily.   PRADAXA 150 MG Caps capsule Generic drug:  dabigatran TAKE ONE CAPSULE BY MOUTH EVERY 12 HOURS   SUMAtriptan 5 MG/ACT nasal  spray Commonly known as:  IMITREX Place 1 spray into the nose every 2 (two) hours as needed.   VENTOLIN HFA 108 (90 Base) MCG/ACT inhaler Generic drug:  albuterol INHALE 2 PUFFS INTO THE LUNGS EVERY 6 (SIX) HOURS AS NEEDED FOR WHEEZING OR SHORTNESS OF BREATH.   Vitamin D (Ergocalciferol) 50000 units Caps capsule Commonly known as:  DRISDOL TAKE 1 CAPSULE (50,000 UNITS TOTAL) BY MOUTH ONCE A WEEK.      Follow-up Information    [name], MD. Schedule an appointment as soon as possible for a visit in 1 week(s).   Specialty:  Family Medicine Why:  Hospital Follow Up  Contact information: [address] [place] [address] [place]. Schedule an appointment as soon as possible for a visit in 2 week(s).  Why:  Establish care; overactive bladder and urinary incontinence Contact information: [address], [address] [place] [address] [phone]         Allergies   Allergies as of 06/22/2017      Reactions   Penicillins Other (See Comments)      Fentanyl Itching   Patient just started the patch on 08-08-2011 and she has noted itching, but states that it is helping.   Sulfonamide Derivatives Hives   Codeine Itching, Rash   tussionex  Is tolerated by patient, no phenergan dm       Medication List    STOP taking these medications   meclizine 25 MG tablet Commonly known as:  ANTIVERT   metFORMIN 500 MG tablet Commonly known as:  GLUCOPHAGE     TAKE these medications   ACCU-CHEK AVIVA PLUS w/Device Kit For once daily testing dx E11.9   ACCU-CHEK FASTCLIX LANCETS Misc Once daily testing dx e11.9   acyclovir 400 MG tablet Commonly known as:  ZOVIRAX TAKE 1 TABLET (400 MG TOTAL) BY MOUTH 3 (THREE) TIMES DAILY.   amLODipine 10 MG tablet Commonly known as:  NORVASC Take 1 tablet (10 mg total) by mouth daily.   atorvastatin 40 MG tablet Commonly known as:  LIPITOR TAKE 1 TABLET (40 MG  TOTAL) BY MOUTH DAILY.   busPIRone 7.5 MG tablet Commonly known as:  BUSPAR Take 1 tablet (7.5 mg total) by mouth 3 (three) times daily.   cephALEXin 500 MG capsule Commonly known as:  KEFLEX Take 1 capsule (500 mg total) by mouth 3 (three) times daily for 5 days.   cloNIDine 0.2 MG tablet Commonly known as:  CATAPRES Take 1 tablet (0.2 mg total) by mouth 2 (two) times daily.   COMBIGAN 0.2-0.5 % ophthalmic solution Generic drug:  brimonidine-timolol Place 1 drop into both eyes 2 (two) times daily.   donepezil 10 MG tablet Commonly known as:  ARICEPT Take 1 tablet (10 mg total) by mouth at bedtime.   glucose blood test strip Commonly known as:  ONE TOUCH ULTRA TEST USE TO TEST DAILY   hydrOXYzine 25 MG tablet Commonly known as:  ATARAX/VISTARIL One tablet at bedtime   imipramine 50 MG tablet Commonly known as:  TOFRANIL Take 2 tablets (100 mg total) by mouth at bedtime.   linagliptin 5 MG Tabs tablet Commonly known as:  TRADJENTA Take 1 tablet (5 mg total) by mouth daily. Start taking on:  06/23/2017   montelukast 10 MG tablet Commonly known as:  SINGULAIR TAKE 1 TABLET (10 MG TOTAL) BY MOUTH AT BEDTIME.   olopatadine 0.1 % ophthalmic solution Commonly known as:  PATANOL INSTILL 1 DROP INTO BOTH EYES TWICE A DAY   omeprazole 40 MG capsule Commonly known as:  PRILOSEC TAKE 1 CAPSULE BY MOUTH TWICE A DAY   oxyCODONE-acetaminophen 10-325 MG tablet Commonly known as:  PERCOCET Take 1 tablet by mouth 2 (two) times daily.   PRADAXA 150 MG Caps capsule Generic drug:  dabigatran TAKE ONE CAPSULE BY MOUTH EVERY 12 HOURS   SUMAtriptan 5 MG/ACT nasal spray Commonly known as:  IMITREX Place 1 spray into the nose every 2 (two) hours as needed.   VENTOLIN HFA 108 (90 Base) MCG/ACT inhaler Generic drug:  albuterol INHALE 2 PUFFS INTO THE LUNGS EVERY 6 (SIX) HOURS AS NEEDED FOR WHEEZING OR SHORTNESS OF BREATH.   Vitamin D (Ergocalciferol) 50000 units Caps  capsule Commonly known as:  DRISDOL TAKE 1 CAPSULE (50,000 UNITS TOTAL) BY  MOUTH ONCE A WEEK.       Procedures/Studies: Dg Chest Port 1 View  Result Date: 06/20/2017 CLINICAL DATA:  Acute onset of fever, shortness of breath and wheezing. EXAM: PORTABLE CHEST 1 VIEW COMPARISON:  06/18/2017, 08/13/2016 and earlier. FINDINGS: Suboptimal inspiration accounts for crowded bronchovascular markings, especially in the bases, and accentuates the cardiac silhouette. Taking this into account, cardiac silhouette upper normal in size to slightly enlarged, unchanged. Thoracic aorta atherosclerotic, unchanged. Hilar and mediastinal contours otherwise unremarkable. Mild pulmonary venous hypertension without overt edema, unchanged since the examination 2 days ago. No new pulmonary parenchymal abnormalities. IMPRESSION: Suboptimal inspiration. Stable borderline to mild cardiomegaly. No acute cardiopulmonary disease. Electronically Signed   By: Evangeline Dakin M.D.   On: 06/20/2017 17:36   Dg Chest Port 1 View  Result Date: 06/18/2017 CLINICAL DATA:  Fever EXAM: PORTABLE CHEST 1 VIEW COMPARISON:  08/13/2016 FINDINGS: No focal airspace disease. Mild cardiomegaly with aortic atherosclerosis. No pneumothorax. IMPRESSION: No active disease.  Borderline to mild cardiomegaly. Electronically Signed   By: Donavan Foil M.D.   On: 06/18/2017 23:30      Subjective: Patient says she is feeling much better and feels stable to go home today.  She has been eating well.  She has no complaints today.  Discharge Exam: Vitals:   06/21/17 2200 06/22/17 0314  BP: (!) 161/85 (!) 146/85  Pulse: 75 70  Resp:  20  Temp:  97.9 F (36.6 C)  SpO2:  98%   Vitals:   06/21/17 2008 06/21/17 2041 06/21/17 2200 06/22/17 0314  BP:  (!) 181/86 (!) 161/85 (!) 146/85  Pulse:  77 75 70  Resp:  20  20  Temp:  98.3 F (36.8 C)  97.9 F (36.6 C)  TempSrc:  Oral  Oral  SpO2: 94% 98%  98%  Weight:      Height:       General exam:  awake, alert, oriented x 3. NAD. Cooperative.  Respiratory system: No increased work of breathing. Cardiovascular system: S1 & S2 heard. No JVD, murmurs, gallops, clicks or pedal edema. Gastrointestinal system: Abdomen is nondistended, soft and nontender. Normal bowel sounds heard. Central nervous system: Alert. No focal neurological deficits. Extremities: no CCE.   The results of significant diagnostics from this hospitalization (including imaging, microbiology, ancillary and laboratory) are listed below for reference.     Microbiology: Recent Results (from the past 240 hour(s))  Blood Culture (routine x 2)     Status: None (Preliminary result)   Collection Time: 06/19/17 12:10 AM  Result Value Ref Range Status   Specimen Description BLOOD RIGHT ARM  Final   Special Requests   Final    BOTTLES DRAWN AEROBIC ONLY Blood Culture adequate volume   Culture   Final    NO GROWTH 3 DAYS Performed at [facility], [address], [place], [place] [address]    Report Status PENDING  Incomplete  Urine culture     Status: Abnormal   Collection Time: 06/19/17 12:50 AM  Result Value Ref Range Status   Specimen Description   Final    URINE, CATHETERIZED Performed at [facility], [address], [place], [place] [address]    Special Requests   Final    NONE Performed at [facility], [address], [place], [place] [address]    Culture 20,000 COLONIES/mL ESCHERICHIA COLI (A)  Final   Report Status 06/21/2017 FINAL  Final   Organism ID, Bacteria ESCHERICHIA COLI (A)  Final  Susceptibility   Escherichia coli - MIC*    AMPICILLIN >=32 RESISTANT Resistant     CEFAZOLIN <=4 SENSITIVE Sensitive     CEFTRIAXONE <=1 SENSITIVE Sensitive     CIPROFLOXACIN >=4 RESISTANT Resistant     GENTAMICIN >=16 RESISTANT Resistant     IMIPENEM <=0.25 SENSITIVE Sensitive     NITROFURANTOIN <=16 SENSITIVE Sensitive     TRIMETH/SULFA >=320 RESISTANT Resistant     AMPICILLIN/SULBACTAM >=32 RESISTANT  Resistant     PIP/TAZO <=4 SENSITIVE Sensitive     Extended ESBL NEGATIVE Sensitive     * 20,000 COLONIES/mL ESCHERICHIA COLI  Blood Culture (routine x 2)     Status: None (Preliminary result)   Collection Time: 06/19/17  1:24 AM  Result Value Ref Range Status   Specimen Description RIGHT ANTECUBITAL  Final   Special Requests   Final    BOTTLES DRAWN AEROBIC AND ANAEROBIC Blood Culture adequate volume   Culture   Final    NO GROWTH 3 DAYS Performed at [facility], [address], [place], [place] [address]    Report Status PENDING  Incomplete     Labs: BNP (last 3 results) No results for input(s): BNP in the last 8760 hours. Basic Metabolic Panel: Recent Labs  Lab 06/19/17 0010 06/19/17 0647 06/20/17 0550 06/21/17 0540  NA 133* 135 134* 134*  K 3.6 3.2* 4.1 3.9  CL 100* 104 104 102  CO2 22 21* 23 21*  GLUCOSE 341* 254* 185* 187*  BUN _0 CREATININE 1.31* 1.09* 1.25* 1.27*  CALCIUM 8.9 8.7* 8.7* 8.7*  MG  --   --   --  1.8   Liver Function Tests: Recent Labs  Lab 06/19/17 0010 06/19/17 0647 06/20/17 0550 06/21/17 0540  AST 17 15 14* 19  ALT _1 ALKPHOS 98 99 88 93  BILITOT 0.5 0.5 0.5 0.4  PROT 7.5 7.5 7.0 7.2  ALBUMIN 3.3* 3.2* 2.9* 2.8*   No results for input(s): LIPASE, AMYLASE in the last 168 hours. No results for input(s): AMMONIA in the last 168 hours. CBC: Recent Labs  Lab 06/19/17 0010 06/19/17 0647 06/20/17 0550 06/21/17 0540  WBC 17.6* 16.0* 10.7* 8.1  NEUTROABS 14.4  --   --  5.3  HGB 10.8* 10.7* 10.4* 10.9*  HCT 33.0* 32.8* 32.7* 34.0*  MCV 83.1 82.2 83.4 82.9  PLT 290 274 251 300   Cardiac Enzymes: No results for input(s): CKTOTAL, CKMB, CKMBINDEX, TROPONINI in the last 168 hours. BNP: Invalid input(s): POCBNP CBG: Recent Labs  Lab 06/21/17 1626 06/21/17 2040 06/22/17 0353 06/22/17 0728 06/22/17 1124  GLUCAP 166* 253* 180* 165* 262*   D-Dimer No results for input(s): DDIMER in the last 72 hours. Hgb  A1c No results for input(s): HGBA1C in the last 72 hours. Lipid Profile No results for input(s): CHOL, HDL, LDLCALC, TRIG, CHOLHDL, LDLDIRECT in the last 72 hours. Thyroid function studies No results for input(s): TSH, T4TOTAL, T3FREE, THYROIDAB in the last 72 hours.  Invalid input(s): FREET3 Anemia work up No results for input(s): VITAMINB12, FOLATE, FERRITIN, TIBC, IRON, RETICCTPCT in the last 72 hours. Urinalysis    Component Value Date/Time   COLORURINE STRAW (A) 06/19/2017 0050   APPEARANCEUR CLEAR 06/19/2017 0050   LABSPEC 1.008 06/19/2017 0050   PHURINE 7.0 06/19/2017 0050   GLUCOSEU >=500 (A) 06/19/2017 0050   HGBUR SMALL (A) 06/19/2017 0050   HGBUR negative 01/06/2010 0929   BILIRUBINUR NEGATIVE 06/19/2017 0050   BILIRUBINUR moderate  11/17/2016 1438   [place] 06/19/2017 0050   PROTEINUR 30 (A) 06/19/2017 0050   UROBILINOGEN >=8.0 (A) 11/17/2016 1438   UROBILINOGEN 0.2 10/12/2011 1556   NITRITE NEGATIVE 06/19/2017 0050   LEUKOCYTESUR LARGE (A) 06/19/2017 0050   Sepsis Labs Invalid input(s): PROCALCITONIN,  WBC,  LACTICIDVEN Microbiology Recent Results (from the past 240 hour(s))  Blood Culture (routine x 2)     Status: None (Preliminary result)   Collection Time: 06/19/17 12:10 AM  Result Value Ref Range Status   Specimen Description BLOOD RIGHT ARM  Final   Special Requests   Final    BOTTLES DRAWN AEROBIC ONLY Blood Culture adequate volume   Culture   Final    NO GROWTH 3 DAYS Performed at [facility], [address], [place], [place] [address]    Report Status PENDING  Incomplete  Urine culture     Status: Abnormal   Collection Time: 06/19/17 12:50 AM  Result Value Ref Range Status   Specimen Description   Final    URINE, CATHETERIZED Performed at [facility], [address], [place], [place] [address]    Special Requests   Final    NONE Performed at [facility], [address] [place], [place] [address]    Culture 20,000 COLONIES/mL  ESCHERICHIA COLI (A)  Final   Report Status 06/21/2017 FINAL  Final   Organism ID, Bacteria ESCHERICHIA COLI (A)  Final      Susceptibility   Escherichia coli - MIC*    AMPICILLIN >=32 RESISTANT Resistant     CEFAZOLIN <=4 SENSITIVE Sensitive     CEFTRIAXONE <=1 SENSITIVE Sensitive     CIPROFLOXACIN >=4 RESISTANT Resistant     GENTAMICIN >=16 RESISTANT Resistant     IMIPENEM <=0.25 SENSITIVE Sensitive     NITROFURANTOIN <=16 SENSITIVE Sensitive     TRIMETH/SULFA >=320 RESISTANT Resistant     AMPICILLIN/SULBACTAM >=32 RESISTANT Resistant     PIP/TAZO <=4 SENSITIVE Sensitive     Extended ESBL NEGATIVE Sensitive     * 20,000 COLONIES/mL ESCHERICHIA COLI  Blood Culture (routine x 2)     Status: None (Preliminary result)   Collection Time: 06/19/17  1:24 AM  Result Value Ref Range Status   Specimen Description RIGHT ANTECUBITAL  Final   Special Requests   Final    BOTTLES DRAWN AEROBIC AND ANAEROBIC Blood Culture adequate volume   Culture   Final    NO GROWTH 3 DAYS Performed at [facility], [address], [place],  [address]    Report Status PENDING  Incomplete   Time coordinating discharge: 35 mins  SIGNED:  Irwin Brakeman, MD  [facility] 06/22/2017, 12:31 PM Pager [phone]  If 7PM-7AM, please contact night-coverage [email or web address] Password [email or web address]

## 2017-06-22 NOTE — Progress Notes (Signed)
Midline catheter removed, WNL. Pressure applied, and then dressing added.

## 2017-06-22 NOTE — Care Management Note (Signed)
Case Management Note  Patient Details  Name: GLESSIE EUSTICE MRN: [id number] Date of Birth: 09/08/1940 Expected Discharge Date:  06/22/17               Expected Discharge Plan:  [place]  In-House Referral:  NA  Discharge planning Services  CM Consult  Post Acute Care Choice:  Home Health Choice offered to:  Patient  HH Arranged:  RN, PT [facility] Agency:  [place]  Status of Service:  Completed, signed off  If discussed at [place] of Stay Meetings, dates discussed:    Additional Comments: DC home today with [place] referral. Aware HH has 48 hrs to make first visit. Juliann Pulse, [facility] rep, aware of DC today.   Sherald Barge, RN 06/22/2017, 1:38 PM

## 2017-06-22 NOTE — Progress Notes (Signed)
Critical lab result: Lactate 2.31 Dr. Christy Gentles notified.

## 2017-06-22 NOTE — Progress Notes (Signed)
Patient discharged with instructions given on medications,and follow up visits,patient and family verbalized understanding. Prescriptions sent to Pharmacy of choice documented on AVS. Accompanied by staff to an awaiting vehicle.

## 2017-06-22 NOTE — Care Management Important Message (Signed)
Important Message  Patient Details  Name: EMBERLIE GOTCHER MRN: [id number] Date of Birth: 1941-02-07   Medicare Important Message Given:  Yes    Shelda Altes 06/22/2017, 12:12 PM

## 2017-06-22 NOTE — Discharge Instructions (Signed)
Antibiotic Medicine, Adult Antibiotic medicines treat infections caused by a type of germ called bacteria. They work by killing the bacteria that make you sick. When do I need to take antibiotics? You often need these medicines to treat bacterial infections, such as:  A urinary tract infection (UTI).  Strep throat.  Meningitis. This affects the spinal cord and brain.  A bad lung infection.  You may start the medicines while your doctor waits for tests to come back. When the tests come back, your doctor may change or stop your medicine. When are antibiotics not needed? You do not need these medicines for most common illnesses, such as:  A cold.  The flu.  A sore throat.  Antibiotics are not always needed for all infections caused by bacteria. Do not ask for these medicines, or take them, when they are not needed. What are the risks of taking antibiotics? Most antibiotics can cause an infection called Clostridium difficile.This causes watery poop (diarrhea). Let your doctor know right away if:  You have watery poop while taking an antibiotic.  You have watery poop after you stop taking an antibiotic. The illness can happen weeks after you stop the medicine.  You also have a risk of getting an infection in the future that antibiotics cannot treat (antibiotic-resistant infection). This type of infection can be dangerous. What else should I know about taking antibiotics?  You need to take the entire prescription. ? Take the medicine for as long as told by your doctor. ? Do not stop taking it even if you start to feel better.  Try not to miss any doses. If you miss a dose, call your doctor.  Birth control pills may not work. If you take birth control pills: ? Keep on taking them. ? Use a second form of birth control, such as a condom. Do this for as long as told by your doctor.  Ask your doctor: ? How long to wait in between doses. ? If you should take the medicine with  food. ? If there is anything you should stay away from while taking the antibiotic, such as: ? Food. ? Drinks. ? Medicines. ? If there are any side effects you should watch for.  Only take the medicines that your doctor told you to take. Do not take medicines that were given to someone else.  Drink a large glass of water with the medicine.  Ask the pharmacist for a tool to measure the medicine, such as: ? A syringe. ? A cup. ? A spoon.  Throw away any extra medicine. Contact a doctor if:  You get worse.  You have new joint pain or muscle aches after starting the medicine.  You have side effects from the medicine, such as: ? Stomach pain. ? Watery poop. ? Feeling sick to your stomach (nausea). Get help right away if:  You have signs of a very bad allergic reaction. If this happens, stop taking the medicine right away. Signs may include: ? Hives. These are raised, itchy, red bumps on the skin. ? Skin rash. ? Trouble breathing. ? Wheezing. ? Swelling. ? Feeling dizzy. ? Throwing up (vomiting).  Your pee (urine) is dark, or is the color of blood.  Your skin turns yellow.  You bruise easily.  You bleed easily.  You have very bad watery poop and cramps in your belly.  You have a very bad headache. Summary  Antibiotics are often used to treat infections caused by bacteria.  Only take these  medicines when needed.  Let your doctor know if you have watery poop while taking an antibiotic.  You need to take the entire prescription. This information is not intended to replace advice given to you by your health care provider. Make sure you discuss any questions you have with your health care provider. Document Released: 11/11/2007 Document Revised: 02/04/2016 Document Reviewed: 02/04/2016 Elsevier Interactive Patient Education  2017 [employer].   Dehydration Dehydration is when there is not enough fluid or water in your body. This happens when you lose more fluids  than you take in. People who are age 77 or older have a higher risk of getting dehydrated. Dehydration can range from mild to very bad. It should be treated right away to keep it from getting very bad. Symptoms of mild dehydration may include:  Thirst.  Dry lips.  Slightly dry mouth.  Dry, warm skin.  Dizziness. Symptoms of moderate dehydration may include:  Very dry mouth.  Muscle cramps.  Dark pee (urine). Pee may be the color of tea.  Your body making less pee.  Your eyes making fewer tears.  Heartbeat that is uneven or faster than normal (palpitations).  Headache.  Light-headedness, especially when you stand up from sitting.  Fainting (syncope). Symptoms of very bad dehydration may include:  Changes in skin, such as: ? Cold and clammy skin. ? Blotchy (mottled) or pale skin. ? Skin that does not quickly return to normal after being lightly pinched and let go (poor skin turgor).  Changes in body fluids, such as: ? Feeling very thirsty. ? Your eyes making fewer tears. ? Not sweating when body temperature is high, such as in hot weather. ? Your body making very little pee.  Changes in vital signs, such as: ? Weak pulse. ? Pulse that is more than 100 beats a minute when you are sitting still. ? Fast breathing. ? Low blood pressure.  Other changes, such as: ? Sunken eyes. ? Cold hands and feet. ? Confusion. ? Lack of energy (lethargy). ? Trouble waking up from sleep. ? Short-term weight loss. ? Unconsciousness. Follow these instructions at home:  If told by your doctor, drink an ORS: ? Make an ORS by using instructions on the package. ? Start by drinking small amounts, about  cup (120 mL) every 5-10 minutes. ? Slowly drink more until you have had the amount that your doctor said to have.  Drink enough clear fluid to keep your pee clear or pale yellow. If you were told to drink an ORS, finish the ORS first, then start slowly drinking clear fluids. Drink  fluids such as: ? Water. Do not drink only water by itself. Doing that can make the salt (sodium) level in your body get too low (hyponatremia). ? Ice chips. ? Fruit juice that you have added water to (diluted). ? Low-calorie sports drinks.  Avoid: ? Alcohol. ? Drinks that have a lot of sugar. These include high-calorie sports drinks, fruit juice that does not have water added, and soda. ? Caffeine. ? Foods that are greasy or have a lot of fat or sugar.  Take over-the-counter and prescription medicines only as told by your doctor.  Do not take salt tablets. Doing that can make the salt level in your body get too high (hypernatremia).  Eat foods that have minerals (electrolytes). Examples include bananas, oranges, potatoes, tomatoes, and spinach.  Keep all follow-up visits as told by your doctor. This is important. Contact a doctor if:  You have belly (  abdominal) pain that: ? Gets worse. ? Stays in one area (localizes).  You have a rash.  You have a stiff neck.  You get angry or annoyed more easily than normal (irritability).  You are more sleepy than normal.  You have a harder time waking up than normal.  You feel: ? Weak. ? Dizzy. ? Very thirsty. Get help right away if:  You have symptoms of very bad dehydration.  You cannot drink fluids without throwing up (vomiting).  Your symptoms get worse with treatment.  You have a fever.  You have a very bad headache.  You are throwing up or having watery poop (diarrhea) and it: ? Gets worse. ? Does not go away.  You have diarrhea for more than 24 hours.  You have blood or something green (bile) in your throw-up.  You have blood in your poop (stool). This may cause poop to look black and tarry.  You have not peed in 6-8 hours.  You have peed (urinated) only a small amount of very dark pee during 6-8 hours.  You pass out (faint).  Your heart rate when you are sitting still is more than 100 beats a  minute.  You have trouble breathing. This information is not intended to replace advice given to you by your health care provider. Make sure you discuss any questions you have with your health care provider. Document Released: 01/21/2011 Document Revised: 08/22/2015 Document Reviewed: 03/28/2015 Elsevier Interactive Patient Education  2018 [employer].    Overactive Bladder, Adult Overactive bladder is a group of urinary symptoms. With overactive bladder, you may suddenly feel the need to pass urine (urinate) right away. After feeling this sudden urge, you might also leak urine if you cannot get to the bathroom fast enough (urinary incontinence). These symptoms might interfere with your daily work or social activities. Overactive bladder symptoms may also wake you up at night. Overactive bladder affects the nerve signals between your bladder and your brain. Your bladder may get the signal to empty before it is full. Very sensitive muscles can also make your bladder squeeze too soon. What are the causes? Many things can cause an overactive bladder. Possible causes include:  Urinary tract infection.  Infection of nearby tissues, such as the prostate.  Prostate enlargement.  Being pregnant with twins or more (multiples).  Surgery on the uterus or urethra.  Bladder stones, inflammation, or tumors.  Drinking too much caffeine or alcohol.  Certain medicines, especially those that you take to help your body get rid of extra fluid (diuretics) by increasing urine production.  Muscle or nerve weakness, especially from: ? A spinal cord injury. ? Stroke. ? Multiple sclerosis. ? Parkinson disease.  Diabetes. This can cause a high urine volume that fills the bladder so quickly that the normal urge to urinate is triggered very strongly.  Constipation. A buildup of too much stool can put pressure on your bladder.  What increases the risk? You may be at greater risk for overactive bladder  if you:  Are an older adult.  Smoke.  Are going through menopause.  Have prostate problems.  Have a neurological disease, such as stroke, dementia, Parkinson disease, or multiple sclerosis (MS).  Eat or drink things that irritate the bladder. These include alcohol, spicy food, and caffeine.  Are overweight or obese.  What are the signs or symptoms? The signs and symptoms of an overactive bladder include:  Sudden, strong urges to urinate.  Leaking urine.  Urinating eight or more  times per day.  Waking up to urinate two or more times per night.  How is this diagnosed? Your health care provider may suspect overactive bladder based on your symptoms. The health care provider will do a physical exam and take your medical history. Blood or urine tests may also be done. For example, you might need to have a bladder function test to check how well you can hold your urine. You might also need to see a health care provider who specializes in the urinary tract (urologist). How is this treated? Treatment for overactive bladder depends on the cause of your condition and whether it is mild or severe. Certain treatments can be done in your health care provider's office or clinic. You can also make lifestyle changes at home. Options include: Behavioral Treatments  Biofeedback. A specialist uses sensors to help you become aware of your bodys signals.  Keeping a daily log of when you need to urinate and what happens after the urge. This may help you manage your condition.  Bladder training. This helps you learn to control the urge to urinate by following a schedule that directs you to urinate at regular intervals (timed voiding). At first, you might have to wait a few minutes after feeling the urge. In time, you should be able to schedule bathroom visits an hour or more apart.  Kegel exercises. These are exercises to strengthen the pelvic floor muscles, which support the bladder. Toning these  muscles can help you control urination, even if your bladder muscles are overactive. A specialist will teach you how to do these exercises correctly. They require daily practice.  Weight loss. If you are obese or overweight, losing weight might relieve your symptoms of overactive bladder. Talk to your health care provider about losing weight and whether there is a specific program or method that would work best for you.  Diet change. This might help if constipation is making your overactive bladder worse. Your health care provider or a dietitian can explain ways to change what you eat to ease constipation. You might also need to consume less alcohol and caffeine or drink other fluids at different times of the day.  Stopping smoking.  Wearing pads to absorb leakage while you wait for other treatments to take effect. Physical Treatments  Electrical stimulation. Electrodes send gentle pulses of electricity to strengthen the nerves or muscles that help to control the bladder. Sometimes, the electrodes are placed outside of the body. In other cases, they might be placed inside the body (implanted). This treatment can take several months to have an effect.  Supportive devices. Women may need a plastic device that fits into the vagina and supports the bladder (pessary). Medicines Several medicines can help treat overactive bladder and are usually used along with other treatments. Some are injected into the muscles involved in urination. Others come in pill form. Your health care provider may prescribe:  Antispasmodics. These medicines block the signals that the nerves send to the bladder. This keeps the bladder from releasing urine at the wrong time.  Tricyclic antidepressants. These types of antidepressants also relax bladder muscles.  Surgery  You may have a device implanted to help manage the nerve signals that indicate when you need to urinate.  You may have surgery to implant electrodes for  electrical stimulation.  Sometimes, very severe cases of overactive bladder require surgery to change the shape of the bladder. Follow these instructions at home:  Take medicines only as directed by your health  care provider.  Use any implants or a pessary as directed by your health care provider.  Make any diet or lifestyle changes that are recommended by your health care provider. These might include: ? Drinking less fluid or drinking at different times of the day. If you need to urinate often during the night, you may need to stop drinking fluids early in the evening. ? Cutting down on caffeine or alcohol. Both can make an overactive bladder worse. Caffeine is found in coffee, tea, and sodas. ? Doing Kegel exercises to strengthen muscles. ? Losing weight if you need to. ? Eating a healthy and balanced diet to prevent constipation.  Keep a journal or log to track how much and when you drink and also when you feel the need to urinate. This will help your health care provider to monitor your condition. Contact a health care provider if:  Your symptoms do not get better after treatment.  Your pain and discomfort are getting worse.  You have more frequent urges to urinate.  You have a fever. Get help right away if: You are not able to control your bladder at all. This information is not intended to replace advice given to you by your health care provider. Make sure you discuss any questions you have with your health care provider. Document Released: 11/28/2008 Document Revised: 07/10/2015 Document Reviewed: 06/27/2013 Elsevier Interactive Patient Education  [employer].

## 2017-06-23 ENCOUNTER — Telehealth: Payer: Self-pay

## 2017-06-23 NOTE — Telephone Encounter (Signed)
Patients sister called in to schedule TOC appt, I scheduled her for the next available with Dr.Simpson 07/04/17. She states patient needs to be seen sooner than that, she said 3-4 days after discharge date 06/22/17. She said the best time to contact patient maybe tomorrow around [place] phone #: [phone]

## 2017-06-23 NOTE — Telephone Encounter (Signed)
[id number] Attempted to speak with patient to complete TOC call. Will try again later

## 2017-06-24 ENCOUNTER — Telehealth: Payer: Self-pay

## 2017-06-24 LAB — CULTURE, BLOOD (ROUTINE X 2)
CULTURE: NO GROWTH
Culture: NO GROWTH
SPECIAL REQUESTS: ADEQUATE
Special Requests: ADEQUATE

## 2017-06-24 NOTE — Telephone Encounter (Signed)
She was recently discharged from the hospital and will need TOC in 1 week in office and ER follow up labs completed in 1 weel also so will give her the new lab order when she comes in to appt so she will not need 2 lab sticks

## 2017-06-24 NOTE — Telephone Encounter (Signed)
[id number] attempted to speak with patient to completed [facility] telephone call. Will try again later. (2nd attempt)

## 2017-06-24 NOTE — Telephone Encounter (Signed)
Transition Care Management Follow-up Telephone Call   Date discharged?06/22/17                How have you been since you were released from the hospital? still weak, but doing ok   Do you understand why you were in the hospital? UTI   Do you understand the discharge instructions? yes   Where were you discharged to? home   Items Reviewed:  Medications reviewed: yes  Allergies reviewed: yes  Dietary changes reviewed: yes no changes  Referrals reviewed: no   Functional Questionnaire:   Activities of Daily Living (ADLs):  yes has help if needed (daughter)    Any transportation issues/concerns?: no   Any patient concerns? no   Confirmed importance and date/time of follow-up visits scheduled yes 07/04/17 @ 1 pm     Confirmed with patient if condition begins to worsen call PCP or go to the ER.  Patient was given the office number and encouraged to call back with question or concerns.  yes

## 2017-06-24 NOTE — Telephone Encounter (Signed)
TOC call completed 

## 2017-06-27 ENCOUNTER — Emergency Department (HOSPITAL_COMMUNITY)
Admission: EM | Admit: 2017-06-27 | Discharge: 2017-06-27 | Disposition: A | Discharge status: Home / Self Care | Payer: PPO | Attending: Emergency Medicine | Admitting: Emergency Medicine

## 2017-06-27 ENCOUNTER — Other Ambulatory Visit: Payer: Self-pay

## 2017-06-27 ENCOUNTER — Encounter (HOSPITAL_COMMUNITY): Payer: Self-pay | Admitting: Emergency Medicine

## 2017-06-27 DIAGNOSIS — Z79899 Other long term (current) drug therapy: Secondary | ICD-10-CM | POA: Diagnosis not present

## 2017-06-27 DIAGNOSIS — M109 Gout, unspecified: Secondary | ICD-10-CM | POA: Diagnosis not present

## 2017-06-27 DIAGNOSIS — E119 Type 2 diabetes mellitus without complications: Secondary | ICD-10-CM | POA: Diagnosis not present

## 2017-06-27 DIAGNOSIS — I10 Essential (primary) hypertension: Secondary | ICD-10-CM | POA: Diagnosis not present

## 2017-06-27 DIAGNOSIS — Z7984 Long term (current) use of oral hypoglycemic drugs: Secondary | ICD-10-CM | POA: Insufficient documentation

## 2017-06-27 DIAGNOSIS — M79671 Pain in right foot: Secondary | ICD-10-CM | POA: Diagnosis present

## 2017-06-27 DIAGNOSIS — Z96652 Presence of left artificial knee joint: Secondary | ICD-10-CM | POA: Diagnosis not present

## 2017-06-27 MED ORDER — COLCHICINE 0.6 MG PO TABS
0.6000 mg | ORAL_TABLET | Freq: Once | ORAL | Status: AC
  Administered 2017-06-27: 0.6 mg via ORAL
  Filled 2017-06-27: qty 1

## 2017-06-27 MED ORDER — COLCHICINE 0.6 MG PO TABS: 0.6000 mg | ORAL_TABLET | Freq: Every day | ORAL | 0 refills | Status: DC

## 2017-06-27 MED ORDER — PREDNISONE 50 MG PO TABS
60.0000 mg | ORAL_TABLET | Freq: Once | ORAL | Status: AC
  Administered 2017-06-27: 60 mg via ORAL
  Filled 2017-06-27: qty 1

## 2017-06-27 MED ORDER — PREDNISONE 20 MG PO TABS: 20.0000 mg | ORAL_TABLET | Freq: Every day | ORAL | 0 refills | Status: DC

## 2017-06-27 NOTE — ED Triage Notes (Signed)
Pt c/o bilateral foot pain since this am. Pt denies any injury to the feet.

## 2017-06-27 NOTE — ED Provider Notes (Signed)
[facility] EMERGENCY DEPARTMENT Provider Note   CSN: [id number] Arrival date & time: 06/27/17  1911     History   Chief Complaint Chief Complaint  Patient presents with  . Foot Pain    HPI [name] is a 77 y.o. female.  Chief complaint is bilateral toe pain.  HPI this is a 77 year old female.  Recent admission for urinary tract infection.  Has been convalescing at home.  Developed some foot pain right sided last night.  Awakened this morning states she cannot walk because of pain in her left toe is been starting to become painful.  No history of gout.  Past Medical History:  Diagnosis Date  . Anemia, iron deficiency 2012   Evaluated by Dr. Oneida Alar; H&H of 9.3/30.8 with [place] in 10/2010; 3/3 positive Hemoccult cards in 07/2011  . Anxiety    was on Prozac for a couple of weeks  . Atrial fibrillation ([place])    takes Coumadin daily  . Bruises easily    takes Coumadin  . Chronic anticoagulation 07/21/2010  . Chronic back pain    related to knee pain  . Degenerative joint disease    Knees  . Depression   . Diabetes mellitus    takes Metformin daily  . Gastroesophageal reflux disease    takes Omeprazole daily  . Glaucoma   . Glaucoma    uses eye drops at night  . History of blood transfusion 1969  . History of gout    was on medication but taken off of several months ago  . Hx of colonic polyps   . Hx of migraines    last one about a yr ago;takes Topamax daily  . Hyperlipidemia    takes Pravastatin daily  . Hypertension    takes Hyzaar daily and Propranolol   . Insomnia    takes Restoril prn  . Joint pain   . Joint swelling   . Memory loss    takes donepezil  . Migraine   . Migraine   . Nocturia   . Overweight(278.02)   . Pneumonia 1969   hx of  . Pulmonary embolism [facility] May 2012   acute presentation, bilateral PE  . Skin spots-aging   . Urinary frequency     Patient Active Problem List   Diagnosis Date Noted  . Sepsis ([place]) 06/19/2017  . Skin  lesion 04/17/2017  . Allergic conjunctivitis 04/17/2017  . Acne 01/16/2017  . Pelvic pain 10/05/2016  . Prolonged Q-T interval on ECG 08/14/2016  . Encounter for chronic pain management 07/17/2016  . Chronic pain syndrome 02/03/2016  . Bilateral knee pain 04/23/2014  . Non-insulin dependent type 2 diabetes mellitus ([place]) 11/15/2013  . Right-sided low back pain with right-sided sciatica 07/02/2013  . Seasonal allergies 04/11/2013  . Urinary incontinence 04/11/2013  . Metabolic syndrome X 71/07/2692  . Chronic anticoagulation 12/22/2012  . Vitamin D deficiency 11/05/2012  . Memory loss 11/01/2012  . Depression with anxiety 11/01/2012  . Anemia, iron deficiency   . Pruritus 09/03/2009  . Morbid obesity ([place]) 08/16/2008  . Dyslipidemia 05/30/2007  . Migraine 05/30/2007  . Essential hypertension 05/30/2007  . Gastroesophageal reflux disease 05/30/2007    Past Surgical History:  Procedure Laterality Date  . CATARACT EXTRACTION W/PHACO Right 04/11/2012   Procedure: CATARACT EXTRACTION PHACO AND INTRAOCULAR LENS PLACEMENT (IOC);  Surgeon: Elta Guadeloupe T. Gershon Crane, MD;  Location: [facility] ORS;  Service: Ophthalmology;  Laterality: Right;  CDE=9.61  . CATARACT EXTRACTION W/PHACO Left 12/26/2012   Procedure:  CATARACT EXTRACTION PHACO AND INTRAOCULAR LENS PLACEMENT (IOC);  Surgeon: Elta Guadeloupe T. Gershon Crane, MD;  Location: [facility] ORS;  Service: Ophthalmology;  Laterality: Left;  CDE:7.74  . COLONOSCOPY  Jan 2002; 2012   2002: Dr. Tamala Julian, ext. hemorrhoids, nl colon; 2012-adenomatous polyps, gastritis on EGD  . DILATION AND CURETTAGE OF UTERUS    . ESOPHAGOGASTRODUODENOSCOPY  08/24/2011   ESOPHAGOGASTRODUODENOSCOPY; esophageal dilatation; Rogene Houston, MD;  . Freda Munro CAPSULE STUDY  08/18/2011   Procedure: GIVENS CAPSULE STUDY;  Surgeon: Rogene Houston, MD;  Location: [facility] ENDO SUITE;  Service: Endoscopy;  Laterality: N/A;  730  . KNEE ARTHROSCOPY W/ MENISCECTOMY  90's   Left  . ORIF ANKLE FRACTURE Right 90's  .  TONSILLECTOMY    . 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 UPPER GASTROINTESTINAL ENDOSCOPY    . URETHRAL DILATION       OB History   None      Home Medications    Prior to Admission medications   Medication Sig Start Date End Date Taking? Authorizing Provider  ACCU-CHEK FASTCLIX LANCETS [place] Once daily testing dx e11.9 11/24/15   [name], MD  acyclovir (ZOVIRAX) 400 MG tablet TAKE 1 TABLET (400 MG TOTAL) BY MOUTH 3 (THREE) TIMES DAILY. 08/13/16   [name], MD  amLODipine (NORVASC) 10 MG tablet Take 1 tablet (10 mg total) by mouth daily. 01/11/17   [name], MD  atorvastatin (LIPITOR) 40 MG tablet TAKE 1 TABLET (40 MG TOTAL) BY MOUTH DAILY. 11/03/16   [name], MD  Blood Glucose Monitoring Suppl (ACCU-CHEK AVIVA PLUS) w/Device KIT For once daily testing dx E11.9 11/24/15   [name], MD  busPIRone (BUSPAR) 7.5 MG tablet Take 1 tablet (7.5 mg total) by mouth 3 (three) times daily. 05/11/17   [name], MD  cephALEXin (KEFLEX) 500 MG capsule Take 1 capsule (500 mg total) by mouth 3 (three) times daily for 5 days. 06/22/17 06/27/17  Johnson, Clanford L, MD  cloNIDine (CATAPRES) 0.2 MG tablet Take 1 tablet (0.2 mg total) by mouth 2 (two) times daily. 10/05/16   [name], MD  colchicine 0.6 MG tablet Take 1 tablet (0.6 mg total) by mouth daily. May decrease to once per day as needed for diarrhea 06/27/17   Tanna Furry, MD  COMBIGAN 0.2-0.5 % ophthalmic solution Place 1 drop into both eyes 2 (two) times daily. 03/23/17   [provider]  donepezil (ARICEPT) 10 MG tablet Take 1 tablet (10 mg total) by mouth at bedtime. 01/11/17   [name], MD  glucose blood (ONE TOUCH ULTRA TEST) test strip USE TO TEST DAILY 06/06/17   [name], MD  hydrOXYzine (ATARAX/VISTARIL) 25 MG tablet One  tablet at bedtime 05/31/17   [name], MD  imipramine (TOFRANIL) 50 MG tablet Take 2 tablets (100 mg total) by mouth at bedtime. 01/11/17   [name], MD  linagliptin (TRADJENTA) 5 MG TABS tablet Take 1 tablet (5 mg total) by mouth daily. 06/23/17   Johnson, Clanford L, MD  montelukast (SINGULAIR) 10 MG tablet TAKE 1 TABLET (10 MG TOTAL) BY MOUTH AT BEDTIME. 11/03/16   [name], MD  olopatadine (PATANOL) 0.1 % ophthalmic solution INSTILL 1 DROP INTO BOTH EYES TWICE A DAY 06/14/17   [name], MD  omeprazole (PRILOSEC) 40 MG capsule  TAKE 1 CAPSULE BY MOUTH TWICE A DAY 06/22/17   [name], MD  oxyCODONE-acetaminophen (PERCOCET) 10-325 MG tablet Take 1 tablet by mouth 2 (two) times daily. 04/12/17   [name], MD  PRADAXA 150 MG CAPS capsule TAKE ONE CAPSULE BY MOUTH EVERY 12 HOURS 10/11/16   Arnoldo Lenis, MD  predniSONE (DELTASONE) 20 MG tablet Take 1 tablet (20 mg total) by mouth daily with breakfast. 06/27/17   Tanna Furry, MD  SUMAtriptan [facility]) 5 MG/ACT nasal spray Place 1 spray into the nose every 2 (two) hours as needed. 01/16/17   [provider]  VENTOLIN HFA 108 (90 Base) MCG/ACT inhaler INHALE 2 PUFFS INTO THE LUNGS EVERY 6 (SIX) HOURS AS NEEDED FOR WHEEZING OR SHORTNESS OF BREATH. 05/26/16   [name], MD  Vitamin D, Ergocalciferol, (DRISDOL) 50000 units CAPS capsule TAKE 1 CAPSULE (50,000 UNITS TOTAL) BY MOUTH ONCE A WEEK. 05/09/17   [name], MD    Family History Family History  Problem Relation Age of Onset  . Diabetes Mother   . Hypertension Mother   . Arthritis Mother   . Heart failure Mother   . Migraines Mother   . Kidney failure Mother   . Leukemia Father   . Migraines Father   . Kidney failure Father   . Diabetes Sister   . Hypertension Sister   . Diabetes Brother   . Hypertension Brother   . Hypertension Brother   . Hypertension Brother   . Hypertension Brother   . Diabetes  Brother   . Diabetes Brother   . Diabetes Brother   . Pulmonary embolism Sister   . Migraines Sister   . Kidney failure Brother   . Colon cancer Neg Hx   . Colon polyps Neg Hx     Social History Social History   Tobacco Use  . Smoking status: Never Smoker  . Smokeless tobacco: Never Used  Substance Use Topics  . Alcohol use: No  . Drug use: No     Allergies   Penicillins; Fentanyl; Sulfonamide derivatives; and Codeine   Review of Systems Review of Systems  Constitutional: Negative for appetite change, chills, diaphoresis, fatigue and fever.  HENT: Negative for mouth sores, sore throat and trouble swallowing.   Eyes: Negative for visual disturbance.  Respiratory: Negative for cough, chest tightness, shortness of breath and wheezing.   Cardiovascular: Negative for chest pain.  Gastrointestinal: Negative for abdominal distention, abdominal pain, diarrhea, nausea and vomiting.  Endocrine: Negative for polydipsia, polyphagia and polyuria.  Genitourinary: Negative for dysuria, frequency and hematuria.  Musculoskeletal: Negative for gait problem.       Redness pain and swelling to right toe  Skin: Negative for color change, pallor and rash.  Neurological: Negative for dizziness, syncope, light-headedness and headaches.  Hematological: Does not bruise/bleed easily.  Psychiatric/Behavioral: Negative for behavioral problems and confusion.     Physical Exam Updated Vital Signs BP (!) 144/71 (BP Location: Right Arm)   Pulse 96   Temp 98.4 F (36.9 C) (Temporal)   Resp 18   Ht 5' 7"  (1.702 m)   Wt 100.2 kg (221 lb)   SpO2 99%   BMI 34.61 kg/m   Physical Exam  Constitutional: She is oriented to person, place, and time. She appears well-developed and well-nourished. No distress.  HENT:  Head: Normocephalic.  Eyes: Pupils are equal, round, and reactive to light. Conjunctivae are normal. No scleral icterus.  Neck: Normal range of motion. Neck supple. No  thyromegaly  present.  Cardiovascular: Normal rate and regular rhythm. Exam reveals no gallop and no friction rub.  No murmur heard. Pulmonary/Chest: Effort normal and breath sounds normal. No respiratory distress. She has no wheezes. She has no rales.  Abdominal: Soft. Bowel sounds are normal. She exhibits no distension. There is no tenderness. There is no rebound.  Musculoskeletal: Normal range of motion.  Soft tissue swelling pain and redness to the right MTP joint.  Some tenderness to the left MTP without acute changes objectively.  Neurological: She is alert and oriented to person, place, and time.  Skin: Skin is warm and dry. No rash noted.  Psychiatric: She has a normal mood and affect. Her behavior is normal.     ED Treatments / Results  Labs (all labs ordered are listed, but only abnormal results are displayed) Labs Reviewed - No data to display  EKG None  Radiology No results found.  Procedures Procedures (including critical care time)  Medications Ordered in ED Medications  predniSONE (DELTASONE) tablet 60 mg (has no administration in time range)  colchicine tablet 0.6 mg (has no administration in time range)     Initial Impression / Assessment and Plan / ED Course  I have reviewed the triage vital signs and the nursing notes.  Pertinent labs & imaging results that were available during my care of the patient were reviewed by me and considered in my medical decision making (see chart for details).    Patient is on Pradaxa.  Cannot take anti-inflammatories.  Has Percocet at home for pain.  Will give prednisone here and then daily x2.  Colchicine.  Dose now.  Then twice daily as tolerated.  Decrease to daily as needed for diarrhea.  Primary care if not improving.  Final Clinical Impressions(s) / ED Diagnoses   Final diagnoses:  Acute gout involving toe of right foot, unspecified cause    ED Discharge Orders        Ordered    predniSONE (DELTASONE) 20 MG tablet  Daily  with breakfast     06/27/17 2110    colchicine 0.6 MG tablet  Daily     06/27/17 2110       Tanna Furry, MD 06/27/17 2112

## 2017-06-27 NOTE — Discharge Instructions (Addendum)
Follow-up with Dr. Moshe Cipro if not improving. Decrease colchicine to once per day if you develop loose stool/diarrhea.

## 2017-07-04 ENCOUNTER — Ambulatory Visit (INDEPENDENT_AMBULATORY_CARE_PROVIDER_SITE_OTHER): Payer: PPO | Admitting: Family Medicine

## 2017-07-04 ENCOUNTER — Encounter: Payer: Self-pay | Admitting: Family Medicine

## 2017-07-04 ENCOUNTER — Other Ambulatory Visit: Payer: Self-pay

## 2017-07-04 VITALS — BP 156/90 | HR 119 | Resp 22 | Ht 67.0 in | Wt 213.1 lb

## 2017-07-04 DIAGNOSIS — I10 Essential (primary) hypertension: Secondary | ICD-10-CM

## 2017-07-04 DIAGNOSIS — N3281 Overactive bladder: Secondary | ICD-10-CM | POA: Diagnosis not present

## 2017-07-04 DIAGNOSIS — R32 Unspecified urinary incontinence: Secondary | ICD-10-CM

## 2017-07-04 DIAGNOSIS — Z09 Encounter for follow-up examination after completed treatment for conditions other than malignant neoplasm: Secondary | ICD-10-CM | POA: Diagnosis not present

## 2017-07-04 MED ORDER — HYDROXYZINE HCL 25 MG PO TABS: 25.0000 mg | ORAL_TABLET | Freq: Three times a day (TID) | ORAL | 3 refills | Status: DC | PRN

## 2017-07-04 MED ORDER — CLONIDINE HCL 0.2 MG PO TABS: 0.2000 mg | ORAL_TABLET | Freq: Three times a day (TID) | ORAL | 3 refills | Status: DC

## 2017-07-04 MED ORDER — INSULIN GLARGINE 100 UNIT/ML SOLOSTAR PEN: PEN_INJECTOR | SUBCUTANEOUS | 11 refills | Status: DC

## 2017-07-04 MED ORDER — CEPHALEXIN 500 MG PO CAPS: 500.0000 mg | ORAL_CAPSULE | Freq: Three times a day (TID) | ORAL | 0 refills | Status: DC

## 2017-07-04 MED ORDER — LINAGLIPTIN 5 MG PO TABS: 5.0000 mg | ORAL_TABLET | Freq: Every day | ORAL | 5 refills | Status: DC

## 2017-07-04 NOTE — Patient Instructions (Addendum)
F/u in 4 to 5 weeks.  Blood pressure is high, and heart rate, clonidine is increased to one tablet every 8 hours, three times daily  1 additional week of keflex is prescribed for the infected cyst in your groin  STOP metformin for your blood sugar since kidney function is not good  New for your diabetes is lantus Once daily at bedtime and once daily tradjenta tablet in the morning  Please go to diabetic class with family, you are referred   We are referring you for home health nurse/ skilled nurse visits due to complex medical problems   Need to test and write down blood sugar three  times daily  Before breakfast, 2 hours after lunch and at bedtime Goal for fasting blood sugar ranges from 100 to 130 and 2 hours after any meal or at bedtime should be between 130 to 170.

## 2017-07-06 ENCOUNTER — Other Ambulatory Visit: Payer: Self-pay

## 2017-07-06 MED ORDER — LINAGLIPTIN 5 MG PO TABS: 5.0000 mg | ORAL_TABLET | Freq: Every day | ORAL | 5 refills | Status: DC

## 2017-07-10 ENCOUNTER — Other Ambulatory Visit: Payer: Self-pay | Admitting: Family Medicine

## 2017-07-13 ENCOUNTER — Telehealth: Payer: Self-pay | Admitting: Family Medicine

## 2017-07-13 NOTE — Telephone Encounter (Signed)
PATIENTS SISTER CALLED IN TO CHECK WITH YOU ABOUT PATIENTS TRIGENTA THAT WAS NOT COVERED BY HER INSURANCE.  PLEASE CALL HER BACK TO DISCUSS.  CB#: [phone]

## 2017-07-14 ENCOUNTER — Other Ambulatory Visit: Payer: Self-pay | Admitting: Family Medicine

## 2017-07-14 NOTE — Telephone Encounter (Signed)
Dr Moshe Cipro, I completed a PA on this medication and it was approved but the copay was still too expensive

## 2017-07-14 NOTE — Telephone Encounter (Signed)
I spoke with sister, tradjenta is discontinued, she is to increase lantus to 12 units for 4 days then to 15 units and call in 1 week, her FBG is averaging 220 at 7 units

## 2017-07-27 ENCOUNTER — Telehealth: Payer: Self-pay | Admitting: Family Medicine

## 2017-07-27 DIAGNOSIS — N3000 Acute cystitis without hematuria: Secondary | ICD-10-CM

## 2017-07-27 NOTE — Telephone Encounter (Signed)
Patients sister called and said [name]'s readings are still running 200 fasting with 15 units of lantus.  What should she do.  Please call her today. I just made her a AWV appt for Tuesday 6/18.

## 2017-07-27 NOTE — Telephone Encounter (Signed)
I spoke with [name], states current ly giving Fiza lantus 15 units twice daily and blood sugars are averaging 217 morning and evening. I advised increase to 20 units twice daily, and call back m ay need to increase further.  Also c/o burning with urination, "never stopped" taking azo, I let her know she was referred to urology. I advised her to sub,it urine for c/s, pls order, she will take her to lab tomorrow

## 2017-07-28 NOTE — Telephone Encounter (Signed)
Lab ordered.

## 2017-07-28 NOTE — Addendum Note (Signed)
Addended by: Eual Fines on: 07/28/2017 01:05 PM   Modules accepted: Orders

## 2017-08-02 ENCOUNTER — Telehealth: Payer: Self-pay

## 2017-08-02 ENCOUNTER — Ambulatory Visit (INDEPENDENT_AMBULATORY_CARE_PROVIDER_SITE_OTHER): Payer: PPO

## 2017-08-02 VITALS — BP 160/80 | HR 105 | Temp 98.8°F | Resp 20 | Ht 67.0 in | Wt 223.1 lb

## 2017-08-02 DIAGNOSIS — Z1159 Encounter for screening for other viral diseases: Secondary | ICD-10-CM

## 2017-08-02 DIAGNOSIS — Z0000 Encounter for general adult medical examination without abnormal findings: Secondary | ICD-10-CM | POA: Diagnosis not present

## 2017-08-02 DIAGNOSIS — N3000 Acute cystitis without hematuria: Secondary | ICD-10-CM | POA: Diagnosis not present

## 2017-08-02 NOTE — Telephone Encounter (Signed)
Patient came in today and was accompanied by her sister [name]. Her sister stated that she is giving her sister 25 units of Lantus bid, however her BG's are still running over 200. Since yesterday, she has been giving her 30 units BID and this morning her BG was 195.   The patient's PHQ score was 16, however, she refuses treatment.   Patient's sister is very worried about her. I told her to keep her follow up scheduled for 08/10/17 and discuss her concerns with Dr.Simpson and she verbalized understanding. I let her know I would send Dr.Simpson a message letting her know the concerns.

## 2017-08-02 NOTE — Patient Instructions (Signed)
[name] , Thank you for taking time to come for your Medicare Wellness Visit. I appreciate your ongoing commitment to your health goals. Please review the following plan we discussed and let me know if I can assist you in the future.   Screening recommendations/referrals: Colonoscopy: UTD Mammogram: We will make you an appointment today before you leave Bone Density: UTD Recommended yearly ophthalmology/optometry visit for glaucoma screening and checkup Recommended yearly dental visit for hygiene and checkup  Vaccinations: Influenza vaccine: Due in the Fall 2019 Pneumococcal vaccine: UTD Tdap vaccine: UTD Shingles vaccine: UTD    Advanced directives: I have given you paperwork for this. Please complete it and have notarized. Please bring [place] a copy so we can scan a copy into your chart.   Conditions/risks identified: We discussed these during your visit.   Next appointment: August 10, 2017 at 1:00 pm with Dr.Simpson   Preventive Care 32 Years and Older, Female Preventive care refers to lifestyle choices and visits with your health care provider that can promote health and wellness. What does preventive care include?  A yearly physical exam. This is also called an annual well check.  Dental exams once or twice a year.  Routine eye exams. Ask your health care provider how often you should have your eyes checked.  Personal lifestyle choices, including:  Daily care of your teeth and gums.  Regular physical activity.  Eating a healthy diet.  Avoiding tobacco and drug use.  Limiting alcohol use.  Practicing safe sex.  Taking low-dose aspirin every day.  Taking vitamin and mineral supplements as recommended by your health care provider. What happens during an annual well check? The services and screenings done by your health care provider during your annual well check will depend on your age, overall health, lifestyle risk factors, and family history of disease. Counseling    Your health care provider may ask you questions about your:  Alcohol use.  Tobacco use.  Drug use.  Emotional well-being.  Home and relationship well-being.  Sexual activity.  Eating habits.  History of falls.  Memory and ability to understand (cognition).  Work and work Statistician.  Reproductive health. Screening  You may have the following tests or measurements:  Height, weight, and BMI.  Blood pressure.  Lipid and cholesterol levels. These may be checked every 5 years, or more frequently if you are over 27 years old.  Skin check.  Lung cancer screening. You may have this screening every year starting at age 74 if you have a 30-pack-year history of smoking and currently smoke or have quit within the past 15 years.  Fecal occult blood test (FOBT) of the stool. You may have this test every year starting at age 37.  Flexible sigmoidoscopy or colonoscopy. You may have a sigmoidoscopy every 5 years or a colonoscopy every 10 years starting at age 6.  Hepatitis C blood test.  Hepatitis B blood test.  Sexually transmitted disease (STD) testing.  Diabetes screening. This is done by checking your blood sugar (glucose) after you have not eaten for a while (fasting). You may have this done every 1-3 years.  Bone density scan. This is done to screen for osteoporosis. You may have this done starting at age 91.  Mammogram. This may be done every 1-2 years. Talk to your health care provider about how often you should have regular mammograms. Talk with your health care provider about your test results, treatment options, and if necessary, the need for more tests. Vaccines  Your health care provider may recommend certain vaccines, such as:  Influenza vaccine. This is recommended every year.  Tetanus, diphtheria, and acellular pertussis (Tdap, Td) vaccine. You may need a Td booster every 10 years.  Zoster vaccine. You may need this after age 17.  Pneumococcal 13-valent  conjugate (PCV13) vaccine. One dose is recommended after age 14.  Pneumococcal polysaccharide (PPSV23) vaccine. One dose is recommended after age 55. Talk to your health care provider about which screenings and vaccines you need and how often you need them. This information is not intended to replace advice given to you by your health care provider. Make sure you discuss any questions you have with your health care provider. Document Released: 02/28/2015 Document Revised: 10/22/2015 Document Reviewed: 12/03/2014 Elsevier Interactive Patient Education  2017 [place] Prevention in the Home Falls can cause injuries. They can happen to people of all ages. There are many things you can do to make your home safe and to help prevent falls. What can I do on the outside of my home?  Regularly fix the edges of walkways and driveways and fix any cracks.  Remove anything that might make you trip as you walk through a door, such as a raised step or threshold.  Trim any bushes or trees on the path to your home.  Use bright outdoor lighting.  Clear any walking paths of anything that might make someone trip, such as rocks or tools.  Regularly check to see if handrails are loose or broken. Make sure that both sides of any steps have handrails.  Any raised decks and porches should have guardrails on the edges.  Have any leaves, snow, or ice cleared regularly.  Use sand or salt on walking paths during winter.  Clean up any spills in your garage right away. This includes oil or grease spills. What can I do in the bathroom?  Use night lights.  Install grab bars by the toilet and in the tub and shower. Do not use towel bars as grab bars.  Use non-skid mats or decals in the tub or shower.  If you need to sit down in the shower, use a plastic, non-slip stool.  Keep the floor dry. Clean up any water that spills on the floor as soon as it happens.  Remove soap buildup in the tub or  shower regularly.  Attach bath mats securely with double-sided non-slip rug tape.  Do not have throw rugs and other things on the floor that can make you trip. What can I do in the bedroom?  Use night lights.  Make sure that you have a light by your bed that is easy to reach.  Do not use any sheets or blankets that are too big for your bed. They should not hang down onto the floor.  Have a firm chair that has side arms. You can use this for support while you get dressed.  Do not have throw rugs and other things on the floor that can make you trip. What can I do in the kitchen?  Clean up any spills right away.  Avoid walking on wet floors.  Keep items that you use a lot in easy-to-reach places.  If you need to reach something above you, use a strong step stool that has a grab bar.  Keep electrical cords out of the way.  Do not use floor polish or wax that makes floors slippery. If you must use wax, use non-skid floor wax.  Do  not have throw rugs and other things on the floor that can make you trip. What can I do with my stairs?  Do not leave any items on the stairs.  Make sure that there are handrails on both sides of the stairs and use them. Fix handrails that are broken or loose. Make sure that handrails are as long as the stairways.  Check any carpeting to make sure that it is firmly attached to the stairs. Fix any carpet that is loose or worn.  Avoid having throw rugs at the top or bottom of the stairs. If you do have throw rugs, attach them to the floor with carpet tape.  Make sure that you have a light switch at the top of the stairs and the bottom of the stairs. If you do not have them, ask someone to add them for you. What else can I do to help prevent falls?  Wear shoes that:  Do not have high heels.  Have rubber bottoms.  Are comfortable and fit you well.  Are closed at the toe. Do not wear sandals.  If you use a stepladder:  Make sure that it is fully  opened. Do not climb a closed stepladder.  Make sure that both sides of the stepladder are locked into place.  Ask someone to hold it for you, if possible.  Clearly mark and make sure that you can see:  Any grab bars or handrails.  First and last steps.  Where the edge of each step is.  Use tools that help you move around (mobility aids) if they are needed. These include:  Canes.  Walkers.  Scooters.  Crutches.  Turn on the lights when you go into a dark area. Replace any light bulbs as soon as they burn out.  Set up your furniture so you have a clear path. Avoid moving your furniture around.  If any of your floors are uneven, fix them.  If there are any pets around you, be aware of where they are.  Review your medicines with your doctor. Some medicines can make you feel dizzy. This can increase your chance of falling. Ask your doctor what other things that you can do to help prevent falls. This information is not intended to replace advice given to you by your health care provider. Make sure you discuss any questions you have with your health care provider. Document Released: 11/28/2008 Document Revised: 07/10/2015 Document Reviewed: 03/08/2014 Elsevier Interactive Patient Education  2017 [employer].

## 2017-08-02 NOTE — Progress Notes (Signed)
Subjective:   [name] is a 77 y.o. female who presents for Medicare Annual (Subsequent) preventive examination.  Review of Systems:         Objective:     Vitals: There were no vitals taken for this visit.  There is no height or weight on file to calculate BMI.  Advanced Directives 06/19/2017 06/18/2017 04/14/2017 08/23/2016 08/14/2016 08/13/2016 11/11/2015  Does Patient Have a Medical Advance Directive? Yes Yes No No No Yes No  Type of Paramedic of [place];Living will - - - [place] -  Does patient want to make changes to medical advance directive? No - Patient declined - - - - - -  Copy of [place] in Chart? No - copy requested No - copy requested - - - - -  Would patient like information on creating a medical advance directive? - - Yes (ED - Information included in AVS) Yes (Inpatient - patient requests chaplain consult to create a medical advance directive) No - Patient declined - No - patient declined information  Pre-existing out of facility DNR order (yellow form or pink MOST form) - - - - - - -    Tobacco Social History   Tobacco Use  Smoking Status Never Smoker  Smokeless Tobacco Never Used     Counseling given: Not Answered   Clinical Intake:                       Past Medical History:  Diagnosis Date  . Anemia, iron deficiency 2012   Evaluated by Dr. Oneida Alar; H&H of 9.3/30.8 with [place] in 10/2010; 3/3 positive Hemoccult cards in 07/2011  . Anxiety    was on Prozac for a couple of weeks  . Atrial fibrillation ([place])    takes Coumadin daily  . Bruises easily    takes Coumadin  . Chronic anticoagulation 07/21/2010  . Chronic back pain    related to knee pain  . Degenerative joint disease    Knees  . Depression   . Diabetes mellitus    takes Metformin daily  . Gastroesophageal reflux disease    takes Omeprazole daily  . Glaucoma   . Glaucoma    uses  eye drops at night  . History of blood transfusion 1969  . History of gout    was on medication but taken off of several months ago  . Hx of colonic polyps   . Hx of migraines    last one about a yr ago;takes Topamax daily  . Hyperlipidemia    takes Pravastatin daily  . Hypertension    takes Hyzaar daily and Propranolol   . Insomnia    takes Restoril prn  . Joint pain   . Joint swelling   . Memory loss    takes donepezil  . Migraine   . Migraine   . Nocturia   . Overweight(278.02)   . Pneumonia 1969   hx of  . Pulmonary embolism [facility]) May 2012   acute presentation, bilateral PE  . Skin spots-aging   . Urinary frequency    Past Surgical History:  Procedure Laterality Date  . CATARACT EXTRACTION W/PHACO Right 04/11/2012   Procedure: CATARACT EXTRACTION PHACO AND INTRAOCULAR LENS PLACEMENT (IOC);  Surgeon: Elta Guadeloupe T. Gershon Crane, MD;  Location: [facility];  Service: Ophthalmology;  Laterality: Right;  CDE=9.61  . CATARACT EXTRACTION W/PHACO Left 12/26/2012   Procedure: CATARACT EXTRACTION PHACO AND  INTRAOCULAR LENS PLACEMENT (IOC);  Surgeon: Elta Guadeloupe T. Gershon Crane, MD;  Location: [facility];  Service: Ophthalmology;  Laterality: Left;  CDE:7.74  . COLONOSCOPY  Jan 2002; 2012   2002: Dr. Tamala Julian, ext. hemorrhoids, nl colon; 2012-adenomatous polyps, gastritis on EGD  . DILATION AND CURETTAGE OF UTERUS    . ESOPHAGOGASTRODUODENOSCOPY  08/24/2011   ESOPHAGOGASTRODUODENOSCOPY; esophageal dilatation; Rogene Houston, MD;  . Freda Munro CAPSULE STUDY  08/18/2011   Procedure: GIVENS CAPSULE STUDY;  Surgeon: Rogene Houston, MD;  Location: [facility];  Service: Endoscopy;  Laterality: N/A;  730  . KNEE ARTHROSCOPY W/ MENISCECTOMY  90's   Left  . ORIF ANKLE FRACTURE Right 90's  . TONSILLECTOMY    . 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 UPPER GASTROINTESTINAL ENDOSCOPY    . URETHRAL  DILATION     Family History  Problem Relation Age of Onset  . Diabetes Mother   . Hypertension Mother   . Arthritis Mother   . Heart failure Mother   . Migraines Mother   . Kidney failure Mother   . Leukemia Father   . Migraines Father   . Kidney failure Father   . Diabetes Sister   . Hypertension Sister   . Diabetes Brother   . Hypertension Brother   . Hypertension Brother   . Hypertension Brother   . Hypertension Brother   . Diabetes Brother   . Diabetes Brother   . Diabetes Brother   . Pulmonary embolism Sister   . Migraines Sister   . Kidney failure Brother   . Colon cancer Neg Hx   . Colon polyps Neg Hx    Social History   Socioeconomic History  . Marital status: Widowed    Spouse name: Not on file  . Number of children: 2  . Years of education: 43  . Highest education level: Not on file  Occupational History  . 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 Financial resource strain: Not on file  . Food insecurity:    Worry: Not on file    Inability: Not on file  . Transportation needs:    Medical: Not on file    Non-medical: Not on file  Tobacco Use  . Smoking status: Never Smoker  . Smokeless tobacco: Never Used  Substance and Sexual Activity  . Alcohol use: No  . Drug use: No  . Sexual activity: Not Currently    Birth control/protection: Post-menopausal  Lifestyle  . Physical activity:    Days per week: Not on file    Minutes per session: Not on file  . Stress: Not on file  Relationships  . Social connections:    Talks on phone: Not on file    Gets together: Not on file    Attends religious service: Not on file    Active member of club or organization: Not on file    Attends meetings of clubs or organizations: Not on file    Relationship status: Not on file  Other Topics Concern  . Not on file  Social History Narrative   Lives at home with husband.   Right-handed.   1 cup caffeine per day.      Outpatient Encounter Medications as of 08/02/2017  Medication Sig  . ACCU-CHEK FASTCLIX LANCETS MISC Once daily testing  dx e11.9  . acyclovir (ZOVIRAX) 400 MG tablet TAKE 1 TABLET (400 MG TOTAL) BY MOUTH 3 (THREE) TIMES DAILY.  Marland Kitchen amLODipine (NORVASC) 10 MG tablet TAKE 1 TABLET BY MOUTH EVERY DAY  . atorvastatin (LIPITOR) 40 MG tablet TAKE 1 TABLET (40 MG TOTAL) BY MOUTH DAILY.  Marland Kitchen Blood Glucose Monitoring Suppl (ACCU-CHEK AVIVA PLUS) w/Device KIT For once daily testing dx E11.9  . busPIRone (BUSPAR) 7.5 MG tablet Take 1 tablet (7.5 mg total) by mouth 3 (three) times daily.  . cephALEXin (KEFLEX) 500 MG capsule Take 1 capsule (500 mg total) by mouth 3 (three) times daily.  . cloNIDine (CATAPRES) 0.2 MG tablet Take 1 tablet (0.2 mg total) by mouth 3 (three) times daily.  . colchicine 0.6 MG tablet Take 1 tablet (0.6 mg total) by mouth daily. May decrease to once per day as needed for diarrhea  . COMBIGAN 0.2-0.5 % ophthalmic solution Place 1 drop into both eyes 2 (two) times daily.  Marland Kitchen donepezil (ARICEPT) 10 MG tablet Take 1 tablet (10 mg total) by mouth at bedtime.  Marland Kitchen glucose blood (ONE TOUCH ULTRA TEST) test strip USE TO TEST DAILY  . hydrOXYzine (ATARAX/VISTARIL) 25 MG tablet Take 1 tablet (25 mg total) by mouth 3 (three) times daily as needed.  Marland Kitchen imipramine (TOFRANIL) 50 MG tablet TAKE 2 TABLETS (100 MG TOTAL) BY MOUTH AT BEDTIME.  . Insulin Glargine (LANTUS) 100 UNIT/ML Solostar Pen 12 units for 4 days then 15 units at bedtime starting 07/14/2017  . montelukast (SINGULAIR) 10 MG tablet TAKE 1 TABLET (10 MG TOTAL) BY MOUTH AT BEDTIME.  Marland Kitchen olopatadine (PATANOL) 0.1 % ophthalmic solution INSTILL 1 DROP INTO BOTH EYES TWICE A DAY  . omeprazole (PRILOSEC) 40 MG capsule TAKE 1 CAPSULE BY MOUTH TWICE A DAY  . oxyCODONE-acetaminophen (PERCOCET) 10-325 MG tablet Take 1 tablet by mouth 2 (two) times daily.  Marland Kitchen PRADAXA 150 MG CAPS capsule TAKE ONE CAPSULE BY MOUTH EVERY 12 HOURS  . predniSONE  (DELTASONE) 20 MG tablet Take 1 tablet (20 mg total) by mouth daily with breakfast.  . SUMAtriptan (IMITREX) 5 MG/ACT nasal spray Place 1 spray into the nose every 2 (two) hours as needed.  . VENTOLIN HFA 108 (90 Base) MCG/ACT inhaler INHALE 2 PUFFS INTO THE LUNGS EVERY 6 (SIX) HOURS AS NEEDED FOR WHEEZING OR SHORTNESS OF BREATH.  Marland Kitchen Vitamin D, Ergocalciferol, (DRISDOL) 50000 units CAPS capsule TAKE 1 CAPSULE (50,000 UNITS TOTAL) BY MOUTH ONCE A WEEK.   Facility-Administered Encounter Medications as of 08/02/2017  Medication  . fentaNYL (SUBLIMAZE) injection 25-50 mcg    Activities of Daily Living In your present state of health, do you have any difficulty performing the following activities: 06/19/2017 08/14/2016  Hearing? Y N  Vision? Y N  Difficulty concentrating or making decisions? Y N  Walking or climbing stairs? Y Y  Dressing or bathing? Y N  Doing errands, shopping? Y N  Some recent data might be hidden    Patient Care Team: [name], MD as PCP - General (Family Medicine) Rogene Houston, MD (Gastroenterology) Renette Butters, MD as Attending Physician (Orthopedic Surgery)    Assessment:   This is a routine wellness examination for [place].  Exercise Activities and Dietary recommendations    Goals    None      Fall Risk Fall Risk  07/04/2017 07/14/2016 11/11/2015 10/30/2015 04/24/2015  Falls in the past year? Yes No No No No  Number falls in past yr: 1 - - - -  Injury with Fall? Yes - - - -  Risk for fall due to : - - Impaired balance/gait Medication side effect;Impaired mobility Impaired balance/gait   Is the patient's home free of loose throw rugs in walkways, pet beds, electrical cords, etc?   no      Grab bars in the bathroom? no but she has a bath chair      Handrails on the stairs?   yes      Adequate lighting?   yes  Timed Get Up and Go performed: No  Depression Screen PHQ 2/9 Scores 07/04/2017 07/14/2016 11/11/2015 10/30/2015  PHQ - 2 Score 4 0 2 0    PHQ- 9 Score 10 - 5 -     Cognitive Function MMSE - Mini Mental State Exam 04/23/2014  Orientation to time 2  Orientation to Place 2  Registration 0  Attention/ Calculation 0  Recall 0  Language- name 2 objects 2  Language- repeat 1  Language- follow 3 step command 3  Language- read & follow direction 1  Write a sentence 1  Copy design 1  Total score 13        Immunization History  Administered Date(s) Administered  . H1N1 12/19/2007  . Influenza Split 11/23/2011  . Influenza Whole 01/07/2004, 11/14/2007, 02/03/2009, 11/30/2009  . Influenza,inj,Quad PF,6+ Mos 11/01/2012, 01/01/2014, 04/24/2015, 10/29/2015, 01/11/2017  . Pneumococcal Conjugate-13 04/23/2014  . Pneumococcal Polysaccharide-23 08/05/2010  . Td 11/07/2003  . Zoster 04/12/2006    Qualifies for Shingles Vaccine?UTD  Screening Tests Health Maintenance  Topic Date Due  . OPHTHALMOLOGY EXAM  05/19/2017  . FOOT EXAM  07/17/2017  . TETANUS/TDAP  09/08/2017 (Originally 11/06/2013)  . INFLUENZA VACCINE  09/15/2017  . HEMOGLOBIN A1C  12/20/2017  . URINE MICROALBUMIN  04/12/2018  . DEXA SCAN  Completed  . PNA vac Low Risk Adult  Completed    Cancer Screenings: Lung: Low Dose CT Chest recommended if Age 29-80 years, 30 pack-year currently smoking OR have quit w/in 15years. Patient does not qualify. Breast:  Up to date on Mammogram? No   Up to date of Bone Density/Dexa? Yes Colorectal: UTD  Additional Screenings:  Hepatitis C Screening: Has not had this screening     Plan:      I have personally reviewed and noted the following in the patient's chart:   . Medical and social history . Use of alcohol, tobacco or illicit drugs  . Current medications and supplements . Functional ability and status . Nutritional status . Physical activity . Advanced directives . List of other physicians . Hospitalizations, surgeries, and ER visits in previous 12 months . Vitals . Screenings to include cognitive,  depression, and falls . Referrals and appointments  In addition, I have reviewed and discussed with patient certain preventive protocols, quality metrics, and best practice recommendations. A written personalized care plan for preventive services as well as general preventive health recommendations were provided to patient.     Tod Persia, [place]  08/02/2017

## 2017-08-03 ENCOUNTER — Other Ambulatory Visit: Payer: Self-pay | Admitting: Family Medicine

## 2017-08-03 LAB — URINE CULTURE
MICRO NUMBER: 90730425
SPECIMEN QUALITY:: ADEQUATE

## 2017-08-03 MED ORDER — INSULIN ASPART 100 UNIT/ML CARTRIDGE (PENFILL): 5.0000 [IU] | Freq: Three times a day (TID) | SUBCUTANEOUS | 0 refills | Status: DC

## 2017-08-03 MED ORDER — FLUOXETINE HCL (PMDD) 10 MG PO TABS: ORAL_TABLET | ORAL | 5 refills | Status: DC

## 2017-08-03 NOTE — Telephone Encounter (Signed)
I spoke with [name] and will start her on novolog 5 units tid , stay on same dose of lantus and will also start fluoxetine, she is aware

## 2017-08-03 NOTE — Progress Notes (Signed)
voog

## 2017-08-08 ENCOUNTER — Other Ambulatory Visit: Payer: Self-pay | Admitting: Family Medicine

## 2017-08-10 ENCOUNTER — Ambulatory Visit (HOSPITAL_COMMUNITY): Payer: Self-pay

## 2017-08-10 ENCOUNTER — Encounter: Payer: Self-pay | Admitting: Family Medicine

## 2017-08-10 ENCOUNTER — Ambulatory Visit (INDEPENDENT_AMBULATORY_CARE_PROVIDER_SITE_OTHER): Payer: PPO | Admitting: Family Medicine

## 2017-08-10 VITALS — BP 140/88 | HR 99 | Resp 16 | Ht 67.0 in | Wt 225.0 lb

## 2017-08-10 DIAGNOSIS — Z794 Long term (current) use of insulin: Secondary | ICD-10-CM | POA: Diagnosis not present

## 2017-08-10 DIAGNOSIS — B373 Candidiasis of vulva and vagina: Secondary | ICD-10-CM

## 2017-08-10 DIAGNOSIS — I10 Essential (primary) hypertension: Secondary | ICD-10-CM

## 2017-08-10 DIAGNOSIS — IMO0001 Reserved for inherently not codable concepts without codable children: Secondary | ICD-10-CM

## 2017-08-10 DIAGNOSIS — E119 Type 2 diabetes mellitus without complications: Secondary | ICD-10-CM | POA: Diagnosis not present

## 2017-08-10 DIAGNOSIS — B3731 Acute candidiasis of vulva and vagina: Secondary | ICD-10-CM

## 2017-08-10 MED ORDER — FLUCONAZOLE 150 MG PO TABS: ORAL_TABLET | ORAL | 0 refills | Status: DC

## 2017-08-10 MED ORDER — ESTROGENS, CONJUGATED 0.625 MG/GM VA CREA: TOPICAL_CREAM | VAGINAL | 0 refills | Status: DC

## 2017-08-10 NOTE — Patient Instructions (Addendum)
F/u  August 12 or after , call if you need me sooner  Non fasting HBA1c , chme 7 and EGFR August 5 or after  Increase lantus to 33 units twice daily start today, after 1 week increase to 35 units twice daily, goal is for blood sugar to average between 140 to 160. Continue short acting at 5 units twice daily  Use premarin cream sparingly twice weekly and 3 days of fluconazole tablets are sent for itch  Keep active and continue to stay away from excess sugar and bread, you are working hard and doing well Thank you  for choosing [facility]. We consider it a privelige to serve you.  Delivering excellent health care in a caring and  compassionate way is our goal.  Partnering with you,  so that together we can achieve this goal is our strategy.

## 2017-08-15 ENCOUNTER — Telehealth: Payer: Self-pay | Admitting: Family Medicine

## 2017-08-15 NOTE — Telephone Encounter (Signed)
Please call in Lantus, please call as she is on humulog $187.00 and Lantus $100.00 regarding the price   Call Vernelle Emerald (sister) --[phone]

## 2017-08-16 NOTE — Telephone Encounter (Signed)
Left message for [name] (sister) requesting call back to discuss insulin.

## 2017-08-17 ENCOUNTER — Other Ambulatory Visit: Payer: Self-pay | Admitting: Family Medicine

## 2017-08-17 MED ORDER — INSULIN GLARGINE 100 UNIT/ML SOLOSTAR PEN: PEN_INJECTOR | SUBCUTANEOUS | 99 refills | Status: DC

## 2017-08-17 NOTE — Telephone Encounter (Signed)
Spoke with Vernelle Emerald (sister) and she stated that patient is now using Lantus 35 units BID. Needs new script sent to pharmacy with these instructions. Also, Humalog is too expensive at $187 copay a month. Is there something else you can prescribe that would be cheaper?

## 2017-08-17 NOTE — Progress Notes (Signed)
New script for lantus

## 2017-08-23 ENCOUNTER — Other Ambulatory Visit: Payer: Self-pay | Admitting: Family Medicine

## 2017-08-24 ENCOUNTER — Ambulatory Visit: Payer: Self-pay | Admitting: Family Medicine

## 2017-08-26 ENCOUNTER — Other Ambulatory Visit: Payer: Self-pay | Admitting: Family Medicine

## 2017-08-26 NOTE — Telephone Encounter (Signed)
Cb#: [phone] Su Hoff)  patient is out of pain medication, pharmacy says no refills are there.

## 2017-08-29 ENCOUNTER — Telehealth: Payer: Self-pay

## 2017-08-29 ENCOUNTER — Other Ambulatory Visit: Payer: Self-pay | Admitting: Family Medicine

## 2017-08-29 ENCOUNTER — Telehealth: Payer: Self-pay | Admitting: Family Medicine

## 2017-08-29 DIAGNOSIS — E559 Vitamin D deficiency, unspecified: Secondary | ICD-10-CM

## 2017-08-29 DIAGNOSIS — I10 Essential (primary) hypertension: Secondary | ICD-10-CM

## 2017-08-29 DIAGNOSIS — E785 Hyperlipidemia, unspecified: Secondary | ICD-10-CM

## 2017-08-29 DIAGNOSIS — E119 Type 2 diabetes mellitus without complications: Secondary | ICD-10-CM

## 2017-08-29 MED ORDER — OXYCODONE-ACETAMINOPHEN 10-325 MG PO TABS: ORAL_TABLET | ORAL | 0 refills | Status: DC

## 2017-08-29 NOTE — Telephone Encounter (Signed)
This is addressed

## 2017-08-29 NOTE — Telephone Encounter (Signed)
Labs ordered per Dr.Simpson  

## 2017-08-29 NOTE — Telephone Encounter (Signed)
No need for additional insuln anymore I spoke with pt's sister today

## 2017-08-29 NOTE — Telephone Encounter (Signed)
Addressed.

## 2017-08-29 NOTE — Telephone Encounter (Signed)
Family member calling again about [name]'s Percocet.  They said she is out now.

## 2017-08-30 ENCOUNTER — Other Ambulatory Visit: Payer: Self-pay | Admitting: Family Medicine

## 2017-08-30 MED ORDER — OXYCODONE-ACETAMINOPHEN 10-325 MG PO TABS: ORAL_TABLET | ORAL | 0 refills | Status: AC

## 2017-09-02 ENCOUNTER — Encounter (HOSPITAL_COMMUNITY): Payer: Self-pay | Admitting: Emergency Medicine

## 2017-09-02 ENCOUNTER — Other Ambulatory Visit: Payer: Self-pay | Admitting: Family Medicine

## 2017-09-02 ENCOUNTER — Other Ambulatory Visit: Payer: Self-pay

## 2017-09-02 ENCOUNTER — Emergency Department (HOSPITAL_COMMUNITY)
Admission: EM | Admit: 2017-09-02 | Discharge: 2017-09-02 | Disposition: A | Discharge status: Home / Self Care | Payer: PPO | Attending: Emergency Medicine | Admitting: Emergency Medicine

## 2017-09-02 DIAGNOSIS — Y939 Activity, unspecified: Secondary | ICD-10-CM | POA: Insufficient documentation

## 2017-09-02 DIAGNOSIS — N390 Urinary tract infection, site not specified: Secondary | ICD-10-CM

## 2017-09-02 DIAGNOSIS — G8929 Other chronic pain: Secondary | ICD-10-CM | POA: Diagnosis not present

## 2017-09-02 DIAGNOSIS — S46812A Strain of other muscles, fascia and tendons at shoulder and upper arm level, left arm, initial encounter: Secondary | ICD-10-CM | POA: Diagnosis not present

## 2017-09-02 DIAGNOSIS — R300 Dysuria: Secondary | ICD-10-CM | POA: Diagnosis present

## 2017-09-02 DIAGNOSIS — Z7901 Long term (current) use of anticoagulants: Secondary | ICD-10-CM | POA: Diagnosis not present

## 2017-09-02 DIAGNOSIS — Y929 Unspecified place or not applicable: Secondary | ICD-10-CM | POA: Diagnosis not present

## 2017-09-02 DIAGNOSIS — S39012A Strain of muscle, fascia and tendon of lower back, initial encounter: Secondary | ICD-10-CM | POA: Diagnosis not present

## 2017-09-02 DIAGNOSIS — Z79899 Other long term (current) drug therapy: Secondary | ICD-10-CM | POA: Insufficient documentation

## 2017-09-02 DIAGNOSIS — N3289 Other specified disorders of bladder: Secondary | ICD-10-CM

## 2017-09-02 DIAGNOSIS — N3281 Overactive bladder: Secondary | ICD-10-CM | POA: Diagnosis not present

## 2017-09-02 DIAGNOSIS — I10 Essential (primary) hypertension: Secondary | ICD-10-CM | POA: Insufficient documentation

## 2017-09-02 DIAGNOSIS — Z794 Long term (current) use of insulin: Secondary | ICD-10-CM | POA: Diagnosis not present

## 2017-09-02 DIAGNOSIS — E119 Type 2 diabetes mellitus without complications: Secondary | ICD-10-CM | POA: Diagnosis not present

## 2017-09-02 DIAGNOSIS — X58XXXA Exposure to other specified factors, initial encounter: Secondary | ICD-10-CM | POA: Diagnosis not present

## 2017-09-02 DIAGNOSIS — Y999 Unspecified external cause status: Secondary | ICD-10-CM | POA: Insufficient documentation

## 2017-09-02 LAB — URINALYSIS, ROUTINE W REFLEX MICROSCOPIC
Bilirubin Urine: NEGATIVE
GLUCOSE, UA: 50 mg/dL — AB
KETONES UR: NEGATIVE mg/dL
NITRITE: NEGATIVE
Protein, ur: 100 mg/dL — AB
Specific Gravity, Urine: 1.01 (ref 1.005–1.030)
pH: 5 (ref 5.0–8.0)

## 2017-09-02 LAB — CBC WITH DIFFERENTIAL/PLATELET
BASOS ABS: 0 10*3/uL (ref 0.0–0.1)
Basophils Relative: 0 %
EOS PCT: 0 %
Eosinophils Absolute: 0 10*3/uL (ref 0.0–0.7)
HEMATOCRIT: 32.8 % — AB (ref 36.0–46.0)
Hemoglobin: 10.7 g/dL — ABNORMAL LOW (ref 12.0–15.0)
LYMPHS PCT: 26 %
Lymphs Abs: 3.2 10*3/uL (ref 0.7–4.0)
MCH: 27.7 pg (ref 26.0–34.0)
MCHC: 32.6 g/dL (ref 30.0–36.0)
MCV: 85 fL (ref 78.0–100.0)
Monocytes Absolute: 0.8 10*3/uL (ref 0.1–1.0)
Monocytes Relative: 6 %
NEUTROS ABS: 8.3 10*3/uL (ref 1.7–7.7)
NEUTROS PCT: 68 %
PLATELETS: 398 10*3/uL (ref 150–400)
RBC: 3.86 MIL/uL — AB (ref 3.87–5.11)
RDW: 15.7 % — ABNORMAL HIGH (ref 11.5–15.5)
WBC: 12.3 10*3/uL — AB (ref 4.0–10.5)

## 2017-09-02 LAB — COMPREHENSIVE METABOLIC PANEL
ALT: 17 U/L (ref 0–44)
AST: 17 U/L (ref 15–41)
Albumin: 3.6 g/dL (ref 3.5–5.0)
Alkaline Phosphatase: 98 U/L (ref 38–126)
Anion gap: 10 (ref 5–15)
BUN: 26 mg/dL — ABNORMAL HIGH (ref 8–23)
CHLORIDE: 100 mmol/L (ref 98–111)
CO2: 24 mmol/L (ref 22–32)
Calcium: 9 mg/dL (ref 8.9–10.3)
Creatinine, Ser: 1.39 mg/dL — ABNORMAL HIGH (ref 0.44–1.00)
GFR calc Af Amer: 41 mL/min — ABNORMAL LOW (ref >60)
GFR calc non Af Amer: 36 mL/min — ABNORMAL LOW (ref >60)
Glucose, Bld: 287 mg/dL — ABNORMAL HIGH (ref 70–99)
Potassium: 3.9 mmol/L (ref 3.5–5.1)
Sodium: 134 mmol/L — ABNORMAL LOW (ref 135–145)
Total Bilirubin: 0.4 mg/dL (ref 0.3–1.2)
Total Protein: 7.9 g/dL (ref 6.5–8.1)

## 2017-09-02 MED ORDER — HYDROCODONE-ACETAMINOPHEN 5-325 MG PO TABS: 1.0000 | ORAL_TABLET | Freq: Four times a day (QID) | ORAL | 0 refills | Status: DC | PRN

## 2017-09-02 MED ORDER — CEPHALEXIN 500 MG PO CAPS: 500.0000 mg | ORAL_CAPSULE | Freq: Three times a day (TID) | ORAL | 0 refills | Status: DC

## 2017-09-02 MED ORDER — METHOCARBAMOL 500 MG PO TABS: 500.0000 mg | ORAL_TABLET | Freq: Three times a day (TID) | ORAL | 0 refills | Status: DC | PRN

## 2017-09-02 MED ORDER — SODIUM CHLORIDE 0.9 % IV SOLN
1.0000 g | Freq: Once | INTRAVENOUS | Status: AC
  Administered 2017-09-02: 1 g via INTRAVENOUS
  Filled 2017-09-02: qty 10

## 2017-09-02 MED ORDER — HYDROCODONE-ACETAMINOPHEN 5-325 MG PO TABS
1.0000 | ORAL_TABLET | Freq: Once | ORAL | Status: AC
  Administered 2017-09-02: 1 via ORAL
  Filled 2017-09-02: qty 1

## 2017-09-02 NOTE — ED Notes (Signed)
Awaiting room assignment  

## 2017-09-02 NOTE — ED Notes (Signed)
Meal provided 

## 2017-09-02 NOTE — ED Notes (Signed)
Pt reports has had painful urination since she was admitted several months ago with urosepsis  Pt states she cries when she urinates due to pain  Urine obtained- clear   To lab,   Comfort measures  Awaiting eval

## 2017-09-02 NOTE — ED Provider Notes (Signed)
[facility] EMERGENCY DEPARTMENT Provider Note   CSN: [id number] Arrival date & time: 09/02/17  1419     History   Chief Complaint Chief Complaint  Patient presents with  . Dysuria    HPI [name] is a 77 y.o. female.  HPI Patient with history of recurrent UTIs presents with urinary hesitancy and dysuria.  This been present for the last few months since she was admitted for UTI.  No definite fever or chills.  States she does have bilateral low back pain but is consistent with her chronic pain and unchanged.  No focal weakness or numbness in her legs. Past Medical History:  Diagnosis Date  . Anemia, iron deficiency 2012   Evaluated by Dr. Oneida Alar; H&H of 9.3/30.8 with [place] in 10/2010; 3/3 positive Hemoccult cards in 07/2011  . Anxiety    was on Prozac for a couple of weeks  . Atrial fibrillation ([place])    takes Coumadin daily  . Bruises easily    takes Coumadin  . Chronic anticoagulation 07/21/2010  . Chronic back pain    related to knee pain  . Degenerative joint disease    Knees  . Depression   . Diabetes mellitus    takes Metformin daily  . Gastroesophageal reflux disease    takes Omeprazole daily  . Glaucoma   . Glaucoma    uses eye drops at night  . History of blood transfusion 1969  . History of gout    was on medication but taken off of several months ago  . Hx of colonic polyps   . Hx of migraines    last one about a yr ago;takes Topamax daily  . Hyperlipidemia    takes Pravastatin daily  . Hypertension    takes Hyzaar daily and Propranolol   . Insomnia    takes Restoril prn  . Joint pain   . Joint swelling   . Memory loss    takes donepezil  . Migraine   . Migraine   . Nocturia   . Overweight(278.02)   . Pneumonia 1969   hx of  . Pulmonary embolism [facility]) May 2012   acute presentation, bilateral PE  . Skin spots-aging   . Urinary frequency     Patient Active Problem List   Diagnosis Date Noted  . Sepsis ([place]) 06/19/2017  . Skin  lesion 04/17/2017  . Allergic conjunctivitis 04/17/2017  . Acne 01/16/2017  . Pelvic pain 10/05/2016  . Prolonged Q-T interval on ECG 08/14/2016  . Encounter for chronic pain management 07/17/2016  . Chronic pain syndrome 02/03/2016  . Bilateral knee pain 04/23/2014  . Non-insulin dependent type 2 diabetes mellitus ([place]) 11/15/2013  . Right-sided low back pain with right-sided sciatica 07/02/2013  . Seasonal allergies 04/11/2013  . Urinary incontinence 04/11/2013  . Metabolic syndrome X 44/31/5400  . Chronic anticoagulation 12/22/2012  . Vitamin D deficiency 11/05/2012  . Memory loss 11/01/2012  . Depression with anxiety 11/01/2012  . Anemia, iron deficiency   . Pruritus 09/03/2009  . Morbid obesity ([place]) 08/16/2008  . Dyslipidemia 05/30/2007  . Migraine 05/30/2007  . Essential hypertension 05/30/2007  . Gastroesophageal reflux disease 05/30/2007    Past Surgical History:  Procedure Laterality Date  . CATARACT EXTRACTION W/PHACO Right 04/11/2012   Procedure: CATARACT EXTRACTION PHACO AND INTRAOCULAR LENS PLACEMENT (IOC);  Surgeon: Elta Guadeloupe T. Gershon Crane, MD;  Location: [facility] ORS;  Service: Ophthalmology;  Laterality: Right;  CDE=9.61  . CATARACT EXTRACTION W/PHACO Left 12/26/2012   Procedure: CATARACT EXTRACTION  PHACO AND INTRAOCULAR LENS PLACEMENT (IOC);  Surgeon: Elta Guadeloupe T. Gershon Crane, MD;  Location: [facility] ORS;  Service: Ophthalmology;  Laterality: Left;  CDE:7.74  . COLONOSCOPY  Jan 2002; 2012   2002: Dr. Tamala Julian, ext. hemorrhoids, nl colon; 2012-adenomatous polyps, gastritis on EGD  . DILATION AND CURETTAGE OF UTERUS    . ESOPHAGOGASTRODUODENOSCOPY  08/24/2011   ESOPHAGOGASTRODUODENOSCOPY; esophageal dilatation; Rogene Houston, MD;  . Freda Munro CAPSULE STUDY  08/18/2011   Procedure: GIVENS CAPSULE STUDY;  Surgeon: Rogene Houston, MD;  Location: [facility] ENDO SUITE;  Service: Endoscopy;  Laterality: N/A;  730  . KNEE ARTHROSCOPY W/ MENISCECTOMY  90's   Left  . ORIF ANKLE FRACTURE Right 90's  .  TONSILLECTOMY    . 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 UPPER GASTROINTESTINAL ENDOSCOPY    . URETHRAL DILATION       OB History   None      Home Medications    Prior to Admission medications   Medication Sig Start Date End Date Taking? Authorizing Provider  ACCU-CHEK FASTCLIX LANCETS [place] Once daily testing dx e11.9 11/24/15  Yes [name], MD  acyclovir (ZOVIRAX) 400 MG tablet TAKE 1 TABLET (400 MG TOTAL) BY MOUTH 3 (THREE) TIMES DAILY. 08/13/16  Yes [name], MD  amLODipine (NORVASC) 10 MG tablet TAKE 1 TABLET BY MOUTH EVERY DAY 07/12/17  Yes [name], MD  atorvastatin (LIPITOR) 40 MG tablet TAKE 1 TABLET (40 MG TOTAL) BY MOUTH DAILY. 11/03/16  Yes [name], MD  B-D UF III MINI PEN NEEDLES 31G X 5 MM MISC USE WITH LANTUS AS DIRECTED 08/08/17  Yes [name], MD  Blood Glucose Monitoring Suppl (ACCU-CHEK AVIVA PLUS) w/Device KIT For once daily testing dx E11.9 11/24/15  Yes [name], MD  busPIRone (BUSPAR) 7.5 MG tablet Take 1 tablet (7.5 mg total) by mouth 3 (three) times daily. 05/11/17  Yes [name], MD  cloNIDine (CATAPRES) 0.2 MG tablet Take 1 tablet (0.2 mg total) by mouth 3 (three) times daily. 07/04/17  Yes [name], MD  colchicine 0.6 MG tablet Take 1 tablet (0.6 mg total) by mouth daily. May decrease to once per day as needed for diarrhea 06/27/17  Yes Tanna Furry, MD  COMBIGAN 0.2-0.5 % ophthalmic solution Place 1 drop into both eyes 2 (two) times daily. 03/23/17  Yes [provider]  conjugated estrogens (PREMARIN) vaginal cream Apply sparingly ( fingertip)  Twice weekly to vaginal area 08/10/17  Yes [name], MD  donepezil (ARICEPT) 10 MG tablet Take 1 tablet (10 mg total) by mouth at bedtime. 01/11/17  Yes [name], MD  Fluoxetine HCl, PMDD,  10 MG TABS One tablet once daily 08/03/17 08/04/18 Yes [name], MD  glucose blood (ONE TOUCH ULTRA TEST) test strip USE TO TEST DAILY 06/06/17  Yes [name], MD  hydrOXYzine (ATARAX/VISTARIL) 25 MG tablet TAKE 1 TABLET BY MOUTH THREE TIMES A DAY AS NEEDED 09/02/17  Yes [name], MD  imipramine (TOFRANIL) 50 MG tablet TAKE 2 TABLETS (100 MG TOTAL) BY MOUTH AT BEDTIME. 07/12/17  Yes [name], MD  Insulin Glargine (LANTUS SOLOSTAR) 100 UNIT/ML Solostar Pen Inject 35 units under the skin twice daily 08/17/17  Yes [name], MD  montelukast (SINGULAIR) 10 MG tablet TAKE 1 TABLET BY MOUTH  AT BEDTIME 08/23/17  Yes [name], MD  olopatadine (PATANOL) 0.1 % ophthalmic solution INSTILL 1 DROP INTO BOTH EYES TWICE A DAY 06/14/17  Yes [name], MD  omeprazole (PRILOSEC) 40 MG capsule TAKE 1 CAPSULE BY MOUTH TWICE A DAY 07/14/17  Yes [name], MD  oxyCODONE-acetaminophen [facility]) 10-325 MG tablet Take one tablet two times daily for back pain 08/30/17 09/30/17 Yes [name], MD  PRADAXA 150 MG CAPS capsule TAKE ONE CAPSULE BY MOUTH EVERY 12 HOURS 10/11/16  Yes Branch, Alphonse Guild, MD  SUMAtriptan (IMITREX) 5 MG/ACT nasal spray Place 1 spray into the nose every 2 (two) hours as needed. 01/16/17  Yes [provider]  VENTOLIN HFA 108 (90 Base) MCG/ACT inhaler INHALE 2 PUFFS INTO THE LUNGS EVERY 6 (SIX) HOURS AS NEEDED FOR WHEEZING OR SHORTNESS OF BREATH. 05/26/16  Yes [name], MD  Vitamin D, Ergocalciferol, (DRISDOL) 50000 units CAPS capsule TAKE 1 CAPSULE (50,000 UNITS TOTAL) BY MOUTH ONCE A WEEK. 05/09/17  Yes [name], MD  cephALEXin (KEFLEX) 500 MG capsule Take 1 capsule (500 mg total) by mouth 3 (three) times daily. 09/02/17   Julianne Rice, MD  fluconazole (DIFLUCAN) 150 MG tablet One tablet once daily for 3 days Patient not taking: Reported on 09/02/2017 08/10/17   [name], MD    HYDROcodone-acetaminophen (NORCO) 5-325 MG tablet Take 1 tablet by mouth every 6 (six) hours as needed for severe pain. 09/02/17   Julianne Rice, MD  insulin aspart (NOVOLOG) cartridge Inject 5 Units into the skin 3 (three) times daily with meals. Patient not taking: Reported on 09/02/2017 08/03/17 09/02/17  [name], MD  Insulin Glargine (LANTUS) 100 UNIT/ML Solostar Pen 12 units for 4 days then 15 units at bedtime starting 07/14/2017 07/14/17   [name], MD  methocarbamol (ROBAXIN) 500 MG tablet Take 1 tablet (500 mg total) by mouth every 8 (eight) hours as needed for muscle spasms. 09/02/17   Julianne Rice, MD    Family History Family History  Problem Relation Age of Onset  . Diabetes Mother   . Hypertension Mother   . Arthritis Mother   . Heart failure Mother   . Migraines Mother   . Kidney failure Mother   . Leukemia Father   . Migraines Father   . Kidney failure Father   . Diabetes Sister   . Hypertension Sister   . Diabetes Brother   . Hypertension Brother   . Hypertension Brother   . Hypertension Brother   . Hypertension Brother   . Diabetes Brother   . Diabetes Brother   . Diabetes Brother   . Pulmonary embolism Sister   . Migraines Sister   . Kidney failure Brother   . Colon cancer Neg Hx   . Colon polyps Neg Hx     Social History Social History   Tobacco Use  . Smoking status: Never Smoker  . Smokeless tobacco: Never Used  Substance Use Topics  . Alcohol use: No  . Drug use: No     Allergies   Penicillins; Fentanyl; Sulfonamide derivatives; and Codeine   Review of Systems Review of Systems  Constitutional: Negative for chills and fever.  HENT: Negative for sore throat and trouble swallowing.   Respiratory: Negative for cough, shortness of breath and wheezing.   Cardiovascular: Negative for chest pain.  Gastrointestinal: Positive for abdominal pain. Negative for constipation, diarrhea, nausea and vomiting.  Genitourinary:  Positive for difficulty urinating, dysuria  and frequency. Negative for flank pain.  Musculoskeletal: Positive for back pain and myalgias. Negative for neck pain and neck stiffness.  Skin: Negative for rash and wound.  Neurological: Negative for dizziness, weakness, light-headedness, numbness and headaches.  All other systems reviewed and are negative.    Physical Exam Updated Vital Signs BP (!) 154/97   Pulse 87   Temp 97.9 F (36.6 C) (Oral)   Resp 18   Ht _0  (1.702 m)   Wt 98.9 kg (218 lb)   SpO2 97%   BMI 34.14 kg/m   Physical Exam  Constitutional: She is oriented to person, place, and time. She appears well-developed and well-nourished. No distress.  HENT:  Head: Normocephalic and atraumatic.  Mouth/Throat: Oropharynx is clear and moist. No oropharyngeal exudate.  Eyes: Pupils are equal, round, and reactive to light. EOM are normal.  Neck: Normal range of motion. Neck supple.  Cardiovascular: Normal rate and regular rhythm. Exam reveals no gallop and no friction rub.  No murmur heard. Pulmonary/Chest: Effort normal and breath sounds normal.  Abdominal: Soft. Bowel sounds are normal. There is tenderness. There is no rebound and no guarding.  Mild suprapubic tenderness to palpation.  No rebound or guarding.  Musculoskeletal: Normal range of motion. She exhibits no edema or tenderness.  No CVA tenderness bilaterally.  Patient has lower lumbar tenderness at the level just above the sacrum and pelvis.  No lower extremity swelling or pain.  Distal pulses intact.  Neurological: She is alert and oriented to person, place, and time.  Moving all extremities without focal deficit.  Sensation intact.  Skin: Skin is warm and dry. Capillary refill takes less than 2 seconds. No rash noted. She is not diaphoretic. No erythema.  Psychiatric: She has a normal mood and affect. Her behavior is normal.  Nursing note and vitals reviewed.    ED Treatments / Results  Labs (all labs  ordered are listed, but only abnormal results are displayed) Labs Reviewed  URINE CULTURE - Abnormal; Notable for the following components:      Result Value   Culture MULTIPLE SPECIES PRESENT, SUGGEST RECOLLECTION (*)    All other components within normal limits  URINALYSIS, ROUTINE W REFLEX MICROSCOPIC - Abnormal; Notable for the following components:   Color, Urine AMBER (*)    APPearance TURBID (*)    Glucose, UA 50 (*)    Hgb urine dipstick SMALL (*)    Protein, ur 100 (*)    Leukocytes, UA LARGE (*)    WBC, UA >50 (*)    Bacteria, UA RARE (*)    Non Squamous Epithelial 0-5 (*)    All other components within normal limits  CBC WITH DIFFERENTIAL/PLATELET - Abnormal; Notable for the following components:   WBC 12.3 (*)    RBC 3.86 (*)    Hemoglobin 10.7 (*)    HCT 32.8 (*)    RDW 15.7 (*)    All other components within normal limits  COMPREHENSIVE METABOLIC PANEL - Abnormal; Notable for the following components:   Sodium 134 (*)    Glucose, Bld 287 (*)    BUN 26 (*)    Creatinine, Ser 1.39 (*)    GFR calc non Af Amer 36 (*)    GFR calc Af Amer 41 (*)    All other components within normal limits    EKG None  Radiology No results found.  Procedures Procedures (including critical care time)  Medications Ordered in ED Medications  cefTRIAXone (ROCEPHIN) 1 g  in sodium chloride 0.9 % 100 mL IVPB (0 g Intravenous Stopped 09/02/17 2110)  HYDROcodone-acetaminophen (NORCO/VICODIN) 5-325 MG per tablet 1 tablet (1 tablet Oral Given 09/02/17 2020)     Initial Impression / Assessment and Plan / ED Course  I have reviewed the triage vital signs and the nursing notes.  Pertinent labs & imaging results that were available during my care of the patient were reviewed by me and considered in my medical decision making (see chart for details).    Appears to have recurrent UTI.  Abdominal exam is benign.  Likely having bladder spasms associated with this.  Patient states she is  feeling better after pain medication.  Given dose of IV antibiotics in the emergency department will discharge with oral course.  She advised to follow-up closely with her primary physician.  Strict return precautions have been given.   Final Clinical Impressions(s) / ED Diagnoses   Final diagnoses:  Lower urinary tract infectious disease  Bladder spasm  Trapezius strain, left, initial encounter    ED Discharge Orders        Ordered    cephALEXin (KEFLEX) 500 MG capsule  3 times daily     09/02/17 2139    HYDROcodone-acetaminophen (NORCO) 5-325 MG tablet  Every 6 hours PRN     09/02/17 2139    methocarbamol (ROBAXIN) 500 MG tablet  Every 8 hours PRN     09/02/17 2139       Julianne Rice, MD 09/06/17 [phone]

## 2017-09-02 NOTE — ED Notes (Signed)
Pt has appt with Dr Diona Fanti  8/6 for her painful urination

## 2017-09-02 NOTE — ED Notes (Signed)
Pt complains of pain  Dr Darreld Mclean in to assess

## 2017-09-04 LAB — URINE CULTURE

## 2017-09-10 ENCOUNTER — Other Ambulatory Visit: Payer: Self-pay | Admitting: Family Medicine

## 2017-09-19 NOTE — Progress Notes (Signed)
   [name]     MRN: [id number]      DOB: [birth year]   HPI Ms. Stiehl is here for follow up of hospitalization from 5/4 to 06/22/2017 with a diagnosis of sepsis due to e coli UTI , initially treated treated with vancomycin and azactam, , then ceftriaxone and discharged home on keflex She presented with metabolic encephalopathy which resolved by the time she was discharged , and also acute kidney injury Her diabetes is ncontrolled , and she is to have an appointment with urology to establish care as she has a h/o recurrent UTI';s She c/o infected cyst in her groin , and when she presents at the visit, despite being discharged on tradjenta, she is still taking metformin because of cost. Grand daughter is with her is supportive and the family will attend class with her and also help with appropriate diet and bloosd sugars Medication she is taking and and her hospital courser is reviewed and follow urology is arranged Her blood sugar remains elevated  ROS See HPI  Denies recent fever or chills. Denies sinus pressure, nasal congestion, ear pain or sore throat. Denies chest congestion, productive cough or wheezing. Denies chest pains, palpitations and leg swelling Denies abdominal pain, nausea, vomiting,diarrhea or constipation.    C/o  joint pain, swelling and limitation in mobility. Denies headaches, seizures, numbness, or tingling. C/o  depression, anxiety and mild  insomnia. Denies skin break down or rash.   PE  BP (!) 156/90 (BP Location: Left Arm, Patient Position: Sitting, Cuff Size: Large)   Pulse (!) 119   Resp (!) 22   Ht 5\' 7"  (1.702 m)   Wt 213 lb 1.9 oz (96.7 kg)   SpO2 94%   BMI 33.38 kg/m   Patient alert and oriented and in no cardiopulmonary distress.  HEENT: No facial asymmetry, EOMI,   oropharynx pink and moist.  Neck decreased ROM no JVD, no mass.  Chest: Clear to auscultation bilaterally.  CVS: S1, S2 no murmurs, no S3.Regular rate.  ABD: Soft non tender.     Ext: No edema  MS: Decreased  ROM spine, shoulders, hips and knees.  Skin: Intact, no ulcerations or rash noted.  Psych: Good eye contact, normal affect. Memory impaired anxious and mildly  depressed appearing.  CNS: CN 2-12 intact, power,  normal throughout.no focal deficits noted.   [facility] discharge follow-up Patient improved , denies urinary symptoms and appetite improving as is her strength. Appointment scheduled for urology follow up. Needs to get tradjenta and stop metformin due to poor renal function, now fully understands this and will also attend diabetes education and work harder on  blood sugar control  Essential hypertension Elevated at visits, no med change at this time  Morbid obesity ([place]) Deteriorated. Patient re-educated about  the importance of commitment to a  minimum of 150 minutes of exercise per week.  The importance of healthy food choices with portion control discussed. Encouraged to start a food diary, count calories and to consider  joining a support group. Sample diet sheets offered. Goals set by the patient for the next several months.   Weight /BMI 09/02/2017 08/10/2017 08/02/2017  WEIGHT 218 lb 225 lb 223 lb 1.9 oz  HEIGHT 5\' 7"  5\' 7"  5\' 7"   BMI 34.14 kg/m2 35.24 kg/m2 34.95 kg/m2

## 2017-09-20 ENCOUNTER — Ambulatory Visit: Payer: PPO | Admitting: Urology

## 2017-09-20 ENCOUNTER — Other Ambulatory Visit (HOSPITAL_COMMUNITY)
Admission: AD | Admit: 2017-09-20 | Discharge: 2017-09-20 | Disposition: A | Discharge status: Home / Self Care | Payer: PPO | Source: Skilled Nursing Facility | Attending: Urology | Admitting: Urology

## 2017-09-20 DIAGNOSIS — N3000 Acute cystitis without hematuria: Secondary | ICD-10-CM

## 2017-09-20 DIAGNOSIS — R32 Unspecified urinary incontinence: Secondary | ICD-10-CM | POA: Diagnosis not present

## 2017-09-20 DIAGNOSIS — N952 Postmenopausal atrophic vaginitis: Secondary | ICD-10-CM

## 2017-09-20 DIAGNOSIS — Z1159 Encounter for screening for other viral diseases: Secondary | ICD-10-CM | POA: Diagnosis not present

## 2017-09-21 ENCOUNTER — Encounter: Payer: Self-pay | Admitting: Family Medicine

## 2017-09-21 LAB — HEPATITIS C ANTIBODY
Hepatitis C Ab: NONREACTIVE
SIGNAL TO CUT-OFF: 0.21 (ref <1.00)

## 2017-09-21 NOTE — Assessment & Plan Note (Signed)
Deteriorated. Patient re-educated about  the importance of commitment to a  minimum of 150 minutes of exercise per week.  The importance of healthy food choices with portion control discussed. Encouraged to start a food diary, count calories and to consider  joining a support group. Sample diet sheets offered. Goals set by the patient for the next several months.   Weight /BMI 09/02/2017 08/10/2017 08/02/2017  WEIGHT 218 lb 225 lb 223 lb 1.9 oz  HEIGHT 5\' 7"  5\' 7"  5\' 7"   BMI 34.14 kg/m2 35.24 kg/m2 34.95 kg/m2

## 2017-09-21 NOTE — Assessment & Plan Note (Signed)
Elevated at visits, no med change at this time

## 2017-09-21 NOTE — Assessment & Plan Note (Signed)
Adequate though sub optimal control, no medication change

## 2017-09-21 NOTE — Assessment & Plan Note (Addendum)
Patient improved , denies urinary symptoms and appetite improving as is her strength. Appointment scheduled for urology follow up. Needs to get tradjenta and stop metformin due to poor renal function, now fully understands this and will also attend diabetes education and work harder on  blood sugar control

## 2017-09-21 NOTE — Assessment & Plan Note (Signed)
Pt to increase lantus to 33 units , titrate up to 35 units twice weekly and to call in after  2 weeks for further guidance. Sister [name] in charge of diabetes management

## 2017-09-21 NOTE — Progress Notes (Signed)
   [name]     MRN: [id number]      DOB: [birth year]   HPI [name] is here for follow up and re-evaluation of chronic medical conditions, in particular uncontrolled diabetes. Her blood sugar is ,much improved and she doing well with the lantus and short acting insulin a Preventive health is updated, specifically  Cancer screening and Immunization.   C/o vaginal itching which is unberable and is still waiting on her appt with urology  ROS Denies recent fever or chills. Denies sinus pressure, nasal congestion, ear pain or sore throat. Denies chest congestion, productive cough or wheezing. Denies chest pains, palpitations and leg swelling Denies abdominal pain, nausea, vomiting,diarrhea or constipation.   Denies dysuria and  frequency and   C/o headaches and lower extremity , numbness, and  tingling. C/o  depression, anxiety and mild  insomnia. Marland Kitchen   PE  BP 140/88   Pulse 99   Resp 16   Ht 5\' 7"  (1.702 m)   Wt 225 lb (102.1 kg)   SpO2 96%   BMI 35.24 kg/m   Patient alert and oriented and in no cardiopulmonary distress.  HEENT: No facial asymmetry, EOMI,   oropharynx pink and moist.  Neck supple no JVD, no mass.  Chest: Clear to auscultation bilaterally.  CVS: S1, S2 no murmurs, no S3.Regular rate.  ABD: Soft non tender.   Ext: No edema  MS: decreased  ROM spine, shoulders, hips and knees.  Skin: Intact, no ulcerations or rash noted.  Psych: Good eye contact, normal affect. Memory impaired anxious and mildly  depressed appearing.  CNS: CN 2-12 intact, power,  normal throughout.no focal deficits noted.   Assessment & Plan  Diabetes mellitus, insulin dependent (IDDM), uncontrolled (HCC) Pt to increase lantus to 33 units , titrate up to 35 units twice weekly and to call in after  2 weeks for further guidance. Sister [name] in charge of diabetes management  Essential hypertension Adequate though sub optimal control, no medication change  Morbid obesity  ([place]) Deteriorated. Patient re-educated about  the importance of commitment to a  minimum of 150 minutes of exercise per week.  The importance of healthy food choices with portion control discussed. Encouraged to start a food diary, count calories and to consider  joining a support group. Sample diet sheets offered. Goals set by the patient for the next several months.   Weight /BMI 09/02/2017 08/10/2017 08/02/2017  WEIGHT 218 lb 225 lb 223 lb 1.9 oz  HEIGHT 5\' 7"  5\' 7"  5\' 7"   BMI 34.14 kg/m2 35.24 kg/m2 34.95 kg/m2      Vulvovaginal candidiasis Fluconazole and premarin prescribed, she has upcoming appt with urology

## 2017-09-21 NOTE — Assessment & Plan Note (Signed)
Fluconazole and premarin prescribed, she has upcoming appt with urology

## 2017-09-22 LAB — URINE CULTURE: Culture: NO GROWTH

## 2017-09-22 NOTE — Telephone Encounter (Signed)
error 

## 2017-09-23 ENCOUNTER — Other Ambulatory Visit: Payer: Self-pay | Admitting: Family Medicine

## 2017-09-26 ENCOUNTER — Ambulatory Visit (INDEPENDENT_AMBULATORY_CARE_PROVIDER_SITE_OTHER): Payer: PPO | Admitting: Family Medicine

## 2017-09-26 ENCOUNTER — Encounter: Payer: Self-pay | Admitting: Family Medicine

## 2017-09-26 ENCOUNTER — Other Ambulatory Visit: Payer: Self-pay

## 2017-09-26 VITALS — BP 138/82 | HR 90 | Resp 12 | Ht 67.0 in | Wt 220.1 lb

## 2017-09-26 DIAGNOSIS — E785 Hyperlipidemia, unspecified: Secondary | ICD-10-CM

## 2017-09-26 DIAGNOSIS — B373 Candidiasis of vulva and vagina: Secondary | ICD-10-CM

## 2017-09-26 DIAGNOSIS — E119 Type 2 diabetes mellitus without complications: Secondary | ICD-10-CM | POA: Diagnosis not present

## 2017-09-26 DIAGNOSIS — B3731 Acute candidiasis of vulva and vagina: Secondary | ICD-10-CM

## 2017-09-26 DIAGNOSIS — I10 Essential (primary) hypertension: Secondary | ICD-10-CM

## 2017-09-26 DIAGNOSIS — E1065 Type 1 diabetes mellitus with hyperglycemia: Secondary | ICD-10-CM | POA: Diagnosis not present

## 2017-09-26 DIAGNOSIS — E559 Vitamin D deficiency, unspecified: Secondary | ICD-10-CM | POA: Diagnosis not present

## 2017-09-26 DIAGNOSIS — G8929 Other chronic pain: Secondary | ICD-10-CM

## 2017-09-26 DIAGNOSIS — F418 Other specified anxiety disorders: Secondary | ICD-10-CM | POA: Diagnosis not present

## 2017-09-26 MED ORDER — FLUCONAZOLE 150 MG PO TABS: ORAL_TABLET | ORAL | 0 refills | Status: DC

## 2017-09-26 MED ORDER — OXYCODONE-ACETAMINOPHEN 10-325 MG PO TABS: ORAL_TABLET | ORAL | 0 refills | Status: AC

## 2017-09-26 NOTE — Progress Notes (Signed)
Fill Date ID Written Drug Qty Days Prescriber Rx # Pharmacy Refill Daily Dose * Pymt Type PMP  08/30/2017  1  08/30/2017  Oxycodone-Acetaminophen 10-325  60.00 30 Ma Sim  [id number]  [place] ([id number])  1/1 30.00 MME Medicare  [place]  07/27/2017  1  06/13/2017  Oxycodone-Acetaminophen 10-325  60.00 30 Ma Sim  [id number]  [place] ([id number])  1/1 30.00 MME Medicare  [place]  06/25/2017  1  04/12/2017  Oxycodone-Acetaminophen 10-325  60.00 30 Ma Sim  [id number]  [place] ([id number])  1/1 30.00 MME Private Pay  [place]  05/26/2017  1  04/12/2017  Oxycodone-Acetaminophen 10-325  60.00 30 Ma Sim  [id number]  [place] ([id number])  1/1 30.00 MME Private Pay  [place]  04/26/2017  1  04/12/2017  Oxycodone-Acetaminophen 10-325  60.00 30 Ma Sim  [id number]  [place] ([id number])  1/1 30.00 MME Private Pay  [place]  04/14/2017  1  04/14/2017  Hydrocodone-Acetamin 5-325 Mg  10.00 2 Ma Jam  [id number]  [place] ([id number])  1/1 25.00 MME Medicare  [place]  04/12/2017  1  04/12/2017  Temazepam 30 Mg Capsule  30.00 30 Ma Sim  [id number]  [place] ([id number])  1/5 1.50 LME Private Pay  [place]  03/25/2017  1  01/11/2017  Oxycodone-Acetaminophen 10-325  60.00 30 Ma Sim  [id number]  [place] ([id number])  1/1 30.00 MME Private Pay  [place]  02/23/2017  1  01/11/2017  Oxycodone-Acetaminophen 10-325  60.00 30 Ma Sim  [id number]  [place] ([id number])  1/1 30.00 MME Private Pay  [place]  01/22/2017  1  01/11/2017  Oxycodone-Acetaminophen 10-325  60.00 30 Ma Sim  [id number]  [place] ([id number])  1/1 30.00 MME Medicare  [place]  12/23/2016  1  10/05/2016  Oxycodone-Acetaminophen 10-325  60.00 30 Ma Sim  [id number]  [place] ([id number])  1/1 30.00 MME Medicare  [place]  11/20/2016  1  10/05/2016  Oxycodone-Acetaminophen 10-325  60.00 30 Ma Sim  [id number]  [place] ([id number])  1/1 30.00 MME Medicare  [place]  10/18/2016  1  10/05/2016  Oxycodone-Acetaminophen 10-325

## 2017-09-26 NOTE — Patient Instructions (Addendum)
F/U in 12 weeks , call if you need me before  Flu vaccine at that visit   No medication changes now, we will call with lab results  HAPPY BIRTHDAY, and MANY, MANY MORE  Please be careful not to fall  Fluconazole sent in for vaginal itch if needed

## 2017-09-27 LAB — COMPLETE METABOLIC PANEL WITH GFR
AG RATIO: 1.2 (calc) (ref 1.0–2.5)
ALT: 11 U/L (ref 6–29)
AST: 12 U/L (ref 10–35)
Albumin: 4.1 g/dL (ref 3.6–5.1)
Alkaline phosphatase (APISO): 125 U/L (ref 33–130)
BUN/Creatinine Ratio: 14 (calc) (ref 6–22)
BUN: 16 mg/dL (ref 7–25)
CALCIUM: 9.4 mg/dL (ref 8.6–10.4)
CO2: 24 mmol/L (ref 20–32)
CREATININE: 1.18 mg/dL — AB (ref 0.60–0.93)
Chloride: 104 mmol/L (ref 98–110)
GFR, EST NON AFRICAN AMERICAN: 44 mL/min/{1.73_m2} — AB (ref >=60)
GFR, Est African American: 51 mL/min/{1.73_m2} — ABNORMAL LOW (ref >=60)
Globulin: 3.3 g/dL (calc) (ref 1.9–3.7)
Glucose, Bld: 163 mg/dL — ABNORMAL HIGH (ref 65–99)
Potassium: 3.9 mmol/L (ref 3.5–5.3)
Sodium: 138 mmol/L (ref 135–146)
Total Bilirubin: 0.2 mg/dL (ref 0.2–1.2)
Total Protein: 7.4 g/dL (ref 6.1–8.1)

## 2017-09-27 LAB — LIPID PANEL
Cholesterol: 141 mg/dL (ref <200)
HDL: 60 mg/dL (ref >50)
LDL CHOLESTEROL (CALC): 59 mg/dL
NON-HDL CHOLESTEROL (CALC): 81 mg/dL (ref <130)
TRIGLYCERIDES: 134 mg/dL (ref <150)
Total CHOL/HDL Ratio: 2.4 (calc) (ref <5.0)

## 2017-09-27 LAB — HEMOGLOBIN A1C
EAG (MMOL/L): 11.7 (calc)
Hgb A1c MFr Bld: 9 % of total Hgb — ABNORMAL HIGH (ref <5.7)
MEAN PLASMA GLUCOSE: 212 (calc)

## 2017-09-27 LAB — TSH: TSH: 2.71 mIU/L (ref 0.40–4.50)

## 2017-09-27 LAB — VITAMIN D 25 HYDROXY (VIT D DEFICIENCY, FRACTURES): VIT D 25 HYDROXY: 32 ng/mL (ref 30–100)

## 2017-10-01 MED ORDER — OXYCODONE-ACETAMINOPHEN 10-325 MG PO TABS: ORAL_TABLET | ORAL | 0 refills | Status: AC

## 2017-10-01 NOTE — Assessment & Plan Note (Signed)
Hyperlipidemia:Low fat diet discussed and encouraged.   Lipid Panel  Lab Results  Component Value Date   CHOL 141 09/26/2017   HDL 60 09/26/2017   LDLCALC 59 09/26/2017   TRIG 134 09/26/2017   CHOLHDL 2.4 09/26/2017   Controlled, no change in medication

## 2017-10-01 NOTE — Assessment & Plan Note (Signed)
Deteriorated. Patient re-educated about  the importance of commitment to a  minimum of 150 minutes of exercise per week.  The importance of healthy food choices with portion control discussed. Encouraged to start a food diary, count calories and to consider  joining a support group. Sample diet sheets offered. Goals set by the patient for the next several months.   Weight /BMI 09/26/2017 09/02/2017 08/10/2017  WEIGHT 220 lb 1.3 oz 218 lb 225 lb  HEIGHT 5\' 7"  5\' 7"  5\' 7"   BMI 34.47 kg/m2 34.14 kg/m2 35.24 kg/m2

## 2017-10-01 NOTE — Assessment & Plan Note (Signed)
The patient's Controlled Substance registry is reviewed and compliance confirmed. Adequacy of  Pain control and level of function is assessed. Medication dosing is adjusted as deemed appropriate. Twelve weeks of medication is prescribed , patient signs for the script and is provided with a follow up appointment between 11 to 12 weeks .  

## 2017-10-01 NOTE — Progress Notes (Signed)
[name]     MRN: [id number]      DOB: [birth year]   HPI [name] is here for follow up and re-evaluation of chronic medical conditions, medication management and review of any available recent lab and radiology data.  Preventive health is updated, specifically  Cancer screening and Immunization.   Questions or concerns regarding consultations or procedures which the PT has had in the interim are  Addressed.Has seen urology and is using premarin cream more frequently and states she feels better The PT denies any adverse reactions to current medications since the last visit. Feels that she has improved with the antidepressant and denies any adverse s/e , seems to be more interested in social interaction and recently enjoyed her birthday celebration There are no new concerns.  There are no specific complaints  Denies polyuria, polydipsia, blurred vision , or hypoglycemic episodes. Sister reports overall that she is very pleased with blood sugars and that fasting blood sugar is seldom over 200.States she still is challenged controlling her intakje  ROS Denies recent fever or chills. Denies sinus pressure, nasal congestion, ear pain or sore throat. Denies chest congestion, productive cough or wheezing. Denies chest pains, palpitations and leg swelling Denies abdominal pain, nausea, vomiting,diarrhea or constipation.   Denies dysuria, frequency, hesitancy c/o  Incontinence.c/o vaginal itch C/o chronic  joint pain, swelling and limitation in mobility. Denies headaches, seizures, numbness, or tingling. Denies uncontrolled depression, anxiety or insomnia. Denies skin break down or rash.   PE  BP 138/82   Pulse 90   Resp 12   Ht 5\' 7"  (1.702 m)   Wt 220 lb 1.3 oz (99.8 kg)   SpO2 96% Comment: room air  BMI 34.47 kg/m   Patient alert and oriented and in no cardiopulmonary distress.  HEENT: No facial asymmetry, EOMI,   oropharynx pink and moist.  Neck decreased ROM  no JVD, no  mass.  Chest: Clear to auscultation bilaterally.  CVS: S1, S2 no murmurs, no S3.Regular rate.  ABD: Soft non tender.   Ext: No edema  CZ:[id number]  ROM spine, shoulders, hips and knees.  Skin: Intact, no ulcerations or rash noted.  Psych: Good eye contact, normal affect. Memory intact not anxious or depressed appearing.  CNS: CN 2-12 intact, power,  normal throughout.no focal deficits noted.   Assessment & Plan Essential hypertension Controlled, no change in medication DASH diet and commitment to daily physical activity for a minimum of 30 minutes discussed and encouraged, as a part of hypertension management. The importance of attaining a healthy weight is also discussed.  BP/Weight 09/26/2017 09/02/2017 08/10/2017 08/02/2017 07/04/2017 02/23/3233 06/22/3218  Systolic BP 254 270 623 762 831 517 616  Diastolic BP 82 97 88 80 90 71 85  Wt. (Lbs) 220.08 218 225 223.12 213.12 221 -  BMI 34.47 34.14 35.24 34.95 33.38 34.61 -       Morbid obesity (HCC) Deteriorated. Patient re-educated about  the importance of commitment to a  minimum of 150 minutes of exercise per week.  The importance of healthy food choices with portion control discussed. Encouraged to start a food diary, count calories and to consider  joining a support group. Sample diet sheets offered. Goals set by the patient for the next several months.   Weight /BMI 09/26/2017 09/02/2017 08/10/2017  WEIGHT 220 lb 1.3 oz 218 lb 225 lb  HEIGHT 5\' 7"  5\' 7"  5\' 7"   BMI 34.47 kg/m2 34.14 kg/m2 35.24 kg/m2      Encounter  for chronic pain management The patient's Controlled Substance registry is reviewed and compliance confirmed. Adequacy of  Pain control and level of function is assessed. Medication dosing is adjusted as deemed appropriate. Twelve weeks of medication is prescribed , patient signs for the script and is provided with a follow up appointment between 11 to 12 weeks .   Vulvovaginal candidiasis Reports mild  sympotoms , fluconazole prescribed , she has uncontrolled diabetes   Diabetes mellitus, insulin dependent (IDDM), uncontrolled ([place]) Improved but still uncontrolled . Medication is increased  [name] is reminded of the importance of commitment to daily physical activity for 30 minutes or more, as able and the need to limit carbohydrate intake to 30 to 60 grams per meal to help with blood sugar control.   The need to take medication as prescribed, test blood sugar as directed, and to call between visits if there is a concern that blood sugar is uncontrolled is also discussed.   [name] is reminded of the importance of daily foot exam, annual eye examination, and good blood sugar, blood pressure and cholesterol control.  Diabetic Labs Latest Ref Rng & Units 09/26/2017 09/02/2017 06/21/2017 06/20/2017 06/19/2017  HbA1c <5.7 % of total Hgb 9.0(H) - - - 10.0(H)  Microalbumin mg/dL - - - - -  Micro/Creat Ratio <30 mcg/mg creat - - - - -  Chol <200 mg/dL 141 - - - -  HDL >50 mg/dL 60 - - - -  Calc LDL mg/dL (calc) 59 - - - -  Triglycerides <150 mg/dL 134 - - - -  Creatinine 0.60 - 0.93 mg/dL 1.18(H) 1.39(H) 1.27(H) 1.25(H) 1.09(H)   BP/Weight 09/26/2017 09/02/2017 08/10/2017 08/02/2017 07/04/2017 05/14/760 03/23/3333  Systolic BP 456 256 389 373 428 768 115  Diastolic BP 82 97 88 80 90 71 85  Wt. (Lbs) 220.08 218 225 223.12 213.12 221 -  BMI 34.47 34.14 35.24 34.95 33.38 34.61 -   Foot/eye exam completion dates Latest Ref Rng & Units 08/10/2017 07/14/2016  Eye Exam No Retinopathy - -  Foot exam Order - - -  Foot Form Completion - Done Done        Dyslipidemia Hyperlipidemia:Low fat diet discussed and encouraged.   Lipid Panel  Lab Results  Component Value Date   CHOL 141 09/26/2017   HDL 60 09/26/2017   LDLCALC 59 09/26/2017   TRIG 134 09/26/2017   CHOLHDL 2.4 09/26/2017   Controlled, no change in medication     Depression with anxiety Improved with medication , continue same, not  suicidal or homicidal

## 2017-10-01 NOTE — Assessment & Plan Note (Signed)
Improved but still uncontrolled . Medication is increased  [name] is reminded of the importance of commitment to daily physical activity for 30 minutes or more, as able and the need to limit carbohydrate intake to 30 to 60 grams per meal to help with blood sugar control.   The need to take medication as prescribed, test blood sugar as directed, and to call between visits if there is a concern that blood sugar is uncontrolled is also discussed.   [name] is reminded of the importance of daily foot exam, annual eye examination, and good blood sugar, blood pressure and cholesterol control.  Diabetic Labs Latest Ref Rng & Units 09/26/2017 09/02/2017 06/21/2017 06/20/2017 06/19/2017  HbA1c <5.7 % of total Hgb 9.0(H) - - - 10.0(H)  Microalbumin mg/dL - - - - -  Micro/Creat Ratio <30 mcg/mg creat - - - - -  Chol <200 mg/dL 141 - - - -  HDL >50 mg/dL 60 - - - -  Calc LDL mg/dL (calc) 59 - - - -  Triglycerides <150 mg/dL 134 - - - -  Creatinine 0.60 - 0.93 mg/dL 1.18(H) 1.39(H) 1.27(H) 1.25(H) 1.09(H)   BP/Weight 09/26/2017 09/02/2017 08/10/2017 08/02/2017 07/04/2017 07/02/15 05/25/4494  Systolic BP 759 163 846 659 935 701 779  Diastolic BP 82 97 88 80 90 71 85  Wt. (Lbs) 220.08 218 225 223.12 213.12 221 -  BMI 34.47 34.14 35.24 34.95 33.38 34.61 -   Foot/eye exam completion dates Latest Ref Rng & Units 08/10/2017 07/14/2016  Eye Exam No Retinopathy - -  Foot exam Order - - -  Foot Form Completion - Done Done

## 2017-10-01 NOTE — Assessment & Plan Note (Signed)
Reports mild sympotoms , fluconazole prescribed , she has uncontrolled diabetes

## 2017-10-01 NOTE — Assessment & Plan Note (Signed)
Improved with medication , continue same, not suicidal or homicidal

## 2017-10-01 NOTE — Assessment & Plan Note (Signed)
Controlled, no change in medication DASH diet and commitment to daily physical activity for a minimum of 30 minutes discussed and encouraged, as a part of hypertension management. The importance of attaining a healthy weight is also discussed.  BP/Weight 09/26/2017 09/02/2017 08/10/2017 08/02/2017 07/04/2017 04/11/6718 10/16/9800  Systolic BP 217 981 025 486 282 417 530  Diastolic BP 82 97 88 80 90 71 85  Wt. (Lbs) 220.08 218 225 223.12 213.12 221 -  BMI 34.47 34.14 35.24 34.95 33.38 34.61 -

## 2017-10-02 ENCOUNTER — Other Ambulatory Visit: Payer: Self-pay | Admitting: Family Medicine

## 2017-10-04 ENCOUNTER — Other Ambulatory Visit: Payer: Self-pay | Admitting: Family Medicine

## 2017-10-04 ENCOUNTER — Telehealth: Payer: Self-pay

## 2017-10-04 DIAGNOSIS — I10 Essential (primary) hypertension: Secondary | ICD-10-CM

## 2017-10-04 DIAGNOSIS — E1065 Type 1 diabetes mellitus with hyperglycemia: Secondary | ICD-10-CM

## 2017-10-04 NOTE — Telephone Encounter (Signed)
Chem 7 +eGFR and HBA1C ordered per Dr.Simpson. Lab orders mailed.  

## 2017-10-05 ENCOUNTER — Other Ambulatory Visit: Payer: Self-pay

## 2017-10-05 MED ORDER — INSULIN GLARGINE 100 UNIT/ML SOLOSTAR PEN: PEN_INJECTOR | SUBCUTANEOUS | 99 refills | Status: DC

## 2017-10-13 ENCOUNTER — Other Ambulatory Visit: Payer: Self-pay | Admitting: Family Medicine

## 2017-11-02 ENCOUNTER — Other Ambulatory Visit: Payer: Self-pay | Admitting: Family Medicine

## 2017-11-03 ENCOUNTER — Telehealth: Payer: Self-pay | Admitting: Family Medicine

## 2017-11-03 NOTE — Telephone Encounter (Signed)
Vernelle Emerald Ms Cobbs sister called and said [name] is on her last pen and she need a rx for 40 units 2 X each day.  Please call in today.

## 2017-11-04 ENCOUNTER — Other Ambulatory Visit: Payer: Self-pay

## 2017-11-04 MED ORDER — INSULIN GLARGINE 100 UNIT/ML SOLOSTAR PEN: PEN_INJECTOR | SUBCUTANEOUS | 3 refills | Status: DC

## 2017-11-04 NOTE — Telephone Encounter (Signed)
Refill sent in

## 2017-11-09 ENCOUNTER — Other Ambulatory Visit: Payer: Self-pay | Admitting: Family Medicine

## 2017-11-14 ENCOUNTER — Other Ambulatory Visit: Payer: Self-pay

## 2017-11-14 DIAGNOSIS — M5416 Radiculopathy, lumbar region: Secondary | ICD-10-CM | POA: Diagnosis not present

## 2017-11-14 MED ORDER — DABIGATRAN ETEXILATE MESYLATE 150 MG PO CAPS
ORAL_CAPSULE | ORAL | 0 refills | Status: DC
Start: 2017-11-14 — End: 2017-12-03

## 2017-11-14 NOTE — Telephone Encounter (Signed)
1 month given,needs apt

## 2017-11-18 ENCOUNTER — Other Ambulatory Visit: Payer: Self-pay | Admitting: Family Medicine

## 2017-11-21 DIAGNOSIS — M1711 Unilateral primary osteoarthritis, right knee: Secondary | ICD-10-CM | POA: Diagnosis not present

## 2017-11-21 DIAGNOSIS — M48062 Spinal stenosis, lumbar region with neurogenic claudication: Secondary | ICD-10-CM | POA: Diagnosis not present

## 2017-11-24 ENCOUNTER — Ambulatory Visit: Payer: PPO | Admitting: Cardiology

## 2017-12-03 ENCOUNTER — Other Ambulatory Visit: Payer: Self-pay | Admitting: Cardiology

## 2017-12-18 ENCOUNTER — Encounter (HOSPITAL_COMMUNITY): Payer: Self-pay | Admitting: Emergency Medicine

## 2017-12-18 ENCOUNTER — Emergency Department (HOSPITAL_COMMUNITY)
Admission: EM | Admit: 2017-12-18 | Discharge: 2017-12-18 | Disposition: A | Discharge status: Home / Self Care | Payer: PPO | Attending: Emergency Medicine | Admitting: Emergency Medicine

## 2017-12-18 DIAGNOSIS — Z7984 Long term (current) use of oral hypoglycemic drugs: Secondary | ICD-10-CM | POA: Insufficient documentation

## 2017-12-18 DIAGNOSIS — Z96652 Presence of left artificial knee joint: Secondary | ICD-10-CM | POA: Insufficient documentation

## 2017-12-18 DIAGNOSIS — Z79899 Other long term (current) drug therapy: Secondary | ICD-10-CM | POA: Diagnosis not present

## 2017-12-18 DIAGNOSIS — R319 Hematuria, unspecified: Secondary | ICD-10-CM | POA: Diagnosis present

## 2017-12-18 DIAGNOSIS — I10 Essential (primary) hypertension: Secondary | ICD-10-CM | POA: Insufficient documentation

## 2017-12-18 DIAGNOSIS — N3001 Acute cystitis with hematuria: Secondary | ICD-10-CM | POA: Diagnosis not present

## 2017-12-18 DIAGNOSIS — E119 Type 2 diabetes mellitus without complications: Secondary | ICD-10-CM | POA: Diagnosis not present

## 2017-12-18 LAB — I-STAT CHEM 8, ED
BUN: 20 mg/dL (ref 8–23)
CREATININE: 1.3 mg/dL — AB (ref 0.44–1.00)
Calcium, Ion: 1.24 mmol/L (ref 1.15–1.40)
Chloride: 107 mmol/L (ref 98–111)
Glucose, Bld: 184 mg/dL — ABNORMAL HIGH (ref 70–99)
HEMATOCRIT: 30 % — AB (ref 36.0–46.0)
HEMOGLOBIN: 10.2 g/dL — AB (ref 12.0–15.0)
Potassium: 3.6 mmol/L (ref 3.5–5.1)
Sodium: 140 mmol/L (ref 135–145)
TCO2: 22 mmol/L (ref 22–32)

## 2017-12-18 LAB — URINALYSIS, ROUTINE W REFLEX MICROSCOPIC
Bacteria, UA: NONE SEEN
Bilirubin Urine: NEGATIVE
GLUCOSE, UA: NEGATIVE mg/dL
Ketones, ur: NEGATIVE mg/dL
NITRITE: NEGATIVE
Protein, ur: 30 mg/dL — AB
RBC / HPF: >50 RBC/hpf — ABNORMAL HIGH (ref 0–5)
SPECIFIC GRAVITY, URINE: 1.013 (ref 1.005–1.030)
pH: 6 (ref 5.0–8.0)

## 2017-12-18 LAB — CBC WITH DIFFERENTIAL/PLATELET
Abs Immature Granulocytes: 0.04 10*3/uL (ref 0.00–0.07)
Basophils Absolute: 0 10*3/uL (ref 0.0–0.1)
Basophils Relative: 0 %
EOS ABS: 0 10*3/uL (ref 0.0–0.5)
EOS PCT: 0 %
HEMATOCRIT: 31.7 % — AB (ref 36.0–46.0)
HEMOGLOBIN: 9.5 g/dL — AB (ref 12.0–15.0)
Immature Granulocytes: 1 %
LYMPHS PCT: 25 %
Lymphs Abs: 2.1 10*3/uL (ref 0.7–4.0)
MCH: 26.5 pg (ref 26.0–34.0)
MCHC: 30 g/dL (ref 30.0–36.0)
MCV: 88.5 fL (ref 80.0–100.0)
MONO ABS: 0.5 10*3/uL (ref 0.1–1.0)
Monocytes Relative: 6 %
Neutro Abs: 5.9 10*3/uL (ref 1.7–7.7)
Neutrophils Relative %: 68 %
Platelets: 410 10*3/uL — ABNORMAL HIGH (ref 150–400)
RBC: 3.58 MIL/uL — ABNORMAL LOW (ref 3.87–5.11)
RDW: 15.3 % (ref 11.5–15.5)
WBC: 8.6 10*3/uL (ref 4.0–10.5)
nRBC: 0 % (ref 0.0–0.2)

## 2017-12-18 LAB — PROTIME-INR
INR: 1.59
PROTHROMBIN TIME: 18.7 s — AB (ref 11.4–15.2)

## 2017-12-18 LAB — CBG MONITORING, ED: GLUCOSE-CAPILLARY: 141 mg/dL — AB (ref 70–99)

## 2017-12-18 MED ORDER — FOSFOMYCIN TROMETHAMINE 3 G PO PACK
3.0000 g | PACK | Freq: Once | ORAL | Status: AC
  Administered 2017-12-18: 3 g via ORAL
  Filled 2017-12-18: qty 3

## 2017-12-18 NOTE — ED Triage Notes (Signed)
Caregiver noticed blood in urine on Thursday On blood thinner  Pt has appt with Dr Moshe Cipro on this coming Weds

## 2017-12-18 NOTE — ED Notes (Signed)
Awaiting meds from pharm.

## 2017-12-18 NOTE — ED Provider Notes (Signed)
[facility] EMERGENCY DEPARTMENT Provider Note   CSN: [id number] Arrival date & time: 12/18/17  1141     History   Chief Complaint Chief Complaint  Patient presents with  . Hematuria    HPI [name] is a 77 y.o. female.  HPI Presents with her niece who assists with the HPI. Patient presents due to concern for hematuria. Patient has a history of multiple medical issues including atrial fibrillation, is on Pradaxa. Reportedly the patient also had a series of urinary tract infections, hematuria in the past months, was seen by her primary care physician, and urologist, with multiple studies, no new diagnosis. Over the past month patient has had no substantial complaints, but over the past 4 days has had new hematuria, initially reported this painless, but after review of systems questioning, it seems that the patient has some suprapubic discomfort. No reported new syncope, no reported no chest pain per There is chronic pain in the patient's back, this is seemingly unchanged. No report any fever, chills, nausea, vomiting, diarrhea. Is unclear if she has taken any medication for relief during this illness, she is previously used azo. Past Medical History:  Diagnosis Date  . Anemia, iron deficiency 2012   Evaluated by Dr. Oneida Alar; H&H of 9.3/30.8 with [place] in 10/2010; 3/3 positive Hemoccult cards in 07/2011  . Anxiety    was on Prozac for a couple of weeks  . Atrial fibrillation ([place])    takes Coumadin daily  . Bruises easily    takes Coumadin  . Chronic anticoagulation 07/21/2010  . Chronic back pain    related to knee pain  . Degenerative joint disease    Knees  . Depression   . Diabetes mellitus    takes Metformin daily  . Gastroesophageal reflux disease    takes Omeprazole daily  . Glaucoma   . Glaucoma    uses eye drops at night  . History of blood transfusion 1969  . History of gout    was on medication but taken off of several months ago  . Hx of colonic  polyps   . Hx of migraines    last one about a yr ago;takes Topamax daily  . Hyperlipidemia    takes Pravastatin daily  . Hypertension    takes Hyzaar daily and Propranolol   . Insomnia    takes Restoril prn  . Joint pain   . Joint swelling   . Memory loss    takes donepezil  . Migraine   . Migraine   . Nocturia   . Overweight(278.02)   . Pneumonia 1969   hx of  . Pulmonary embolism [facility]) May 2012   acute presentation, bilateral PE  . Skin spots-aging   . Urinary frequency     Patient Active Problem List   Diagnosis Date Noted  . Skin lesion 04/17/2017  . Prolonged Q-T interval on ECG 08/14/2016  . Encounter for chronic pain management 07/17/2016  . Chronic pain syndrome 02/03/2016  . Vulvovaginal candidiasis 10/29/2015  . Bilateral knee pain 04/23/2014  . Diabetes mellitus, insulin dependent (IDDM), uncontrolled ([place]) 11/15/2013  . Right-sided low back pain with right-sided sciatica 07/02/2013  . Seasonal allergies 04/11/2013  . Urinary incontinence 04/11/2013  . Metabolic syndrome X 95/10/3265  . Chronic anticoagulation 12/22/2012  . Vitamin D deficiency 11/05/2012  . Memory loss 11/01/2012  . Depression with anxiety 11/01/2012  . Anemia, iron deficiency   . Pruritus 09/03/2009  . Morbid obesity ([place]) 08/16/2008  . Dyslipidemia  05/30/2007  . Migraine 05/30/2007  . Essential hypertension 05/30/2007  . Gastroesophageal reflux disease 05/30/2007    Past Surgical History:  Procedure Laterality Date  . CATARACT EXTRACTION W/PHACO Right 04/11/2012   Procedure: CATARACT EXTRACTION PHACO AND INTRAOCULAR LENS PLACEMENT (IOC);  Surgeon: Elta Guadeloupe T. Gershon Crane, MD;  Location: [facility] ORS;  Service: Ophthalmology;  Laterality: Right;  CDE=9.61  . CATARACT EXTRACTION W/PHACO Left 12/26/2012   Procedure: CATARACT EXTRACTION PHACO AND INTRAOCULAR LENS PLACEMENT (IOC);  Surgeon: Elta Guadeloupe T. Gershon Crane, MD;  Location: [facility] ORS;  Service: Ophthalmology;  Laterality: Left;  CDE:7.74  . COLONOSCOPY   Jan 2002; 2012   2002: Dr. Tamala Julian, ext. hemorrhoids, nl colon; 2012-adenomatous polyps, gastritis on EGD  . DILATION AND CURETTAGE OF UTERUS    . ESOPHAGOGASTRODUODENOSCOPY  08/24/2011   ESOPHAGOGASTRODUODENOSCOPY; esophageal dilatation; Rogene Houston, MD;  . Freda Munro CAPSULE STUDY  08/18/2011   Procedure: GIVENS CAPSULE STUDY;  Surgeon: Rogene Houston, MD;  Location: [facility] ENDO SUITE;  Service: Endoscopy;  Laterality: N/A;  730  . KNEE ARTHROSCOPY W/ MENISCECTOMY  90's   Left  . ORIF ANKLE FRACTURE Right 90's  . TONSILLECTOMY    . 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 UPPER GASTROINTESTINAL ENDOSCOPY    . URETHRAL DILATION       OB History   None      Home Medications    Prior to Admission medications   Medication Sig Start Date End Date Taking? Authorizing Provider  acyclovir (ZOVIRAX) 400 MG tablet TAKE 1 TABLET (400 MG TOTAL) BY MOUTH 3 (THREE) TIMES DAILY. 09/12/17   [name], MD  amLODipine (NORVASC) 10 MG tablet TAKE 1 TABLET BY MOUTH EVERY DAY 07/12/17   [name], MD  atorvastatin (LIPITOR) 40 MG tablet TAKE 1 TABLET BY MOUTH EVERY DAY 09/26/17   [name], MD  B-D UF III MINI PEN NEEDLES 31G X 5 MM MISC USE WITH LANTUS AS DIRECTED 08/08/17   [name], MD  Blood Glucose Monitoring Suppl (ACCU-CHEK AVIVA PLUS) w/Device KIT For once daily testing dx E11.9 11/24/15   [name], MD  busPIRone (BUSPAR) 7.5 MG tablet Take 1 tablet (7.5 mg total) by mouth 3 (three) times daily. 05/11/17   [name], MD  cloNIDine (CATAPRES) 0.2 MG tablet TAKE 1 TABLET (0.2 MG TOTAL) BY MOUTH 3 (THREE) TIMES DAILY. 10/04/17   [name], MD  colchicine 0.6 MG tablet Take 1 tablet (0.6 mg total) by mouth daily. May decrease to once per day as needed for diarrhea 06/27/17   Tanna Furry, MD  COMBIGAN 0.2-0.5 %  ophthalmic solution Place 1 drop into both eyes 2 (two) times daily. 03/23/17   [provider]  conjugated estrogens (PREMARIN) vaginal cream Apply sparingly ( fingertip)  Twice weekly to vaginal area 08/10/17   [name], MD  dabigatran (PRADAXA) 150 MG CAPS capsule TAKE ONE CAPSULE BY MOUTH EVERY 12 HOURS 12/05/17   Arnoldo Lenis, MD  donepezil (ARICEPT) 10 MG tablet TAKE 1 TABLET BY MOUTH EVERYDAY AT BEDTIME 11/10/17   [name], MD  fluconazole (DIFLUCAN) 150 MG tablet Take one tablet once daily as needed, for vaginal itch due to yeast 09/26/17   [name], MD  Fluoxetine HCl, PMDD, 10 MG TABS One tablet once daily 08/03/17 08/04/18  [name], MD  glucose blood (ONE TOUCH ULTRA TEST) test strip USE TO TEST DAILY 06/06/17   [name], MD  HYDROcodone-acetaminophen (NORCO) 5-325 MG tablet Take 1 tablet by mouth every 6 (six) hours as needed for severe pain. 09/02/17   Julianne Rice, MD  hydrOXYzine (ATARAX/VISTARIL) 25 MG tablet TAKE 1 TABLET BY MOUTH THREE TIMES A DAY AS NEEDED 09/02/17   [name], MD  imipramine (TOFRANIL) 50 MG tablet TAKE 2 TABLETS (100 MG TOTAL) BY MOUTH AT BEDTIME. 07/12/17   [name], MD  Insulin Glargine (LANTUS SOLOSTAR) 100 UNIT/ML Solostar Pen Inject 40 units under the skin twice daily 11/04/17   [name], MD  metFORMIN (GLUCOPHAGE) 500 MG tablet TAKE 1 TABLET (500 MG TOTAL) BY MOUTH 2 (TWO) TIMES DAILY WITH A MEAL. 09/26/17   [name], MD  methocarbamol (ROBAXIN) 500 MG tablet Take 1 tablet (500 mg total) by mouth every 8 (eight) hours as needed for muscle spasms. 09/02/17   Julianne Rice, MD  montelukast (SINGULAIR) 10 MG tablet TAKE 1 TABLET BY MOUTH AT BEDTIME 08/23/17   [name], MD  olopatadine (PATANOL) 0.1 % ophthalmic solution INSTILL 1 DROP INTO BOTH EYES TWICE A DAY 06/14/17   [name], MD  omeprazole (PRILOSEC) 40 MG capsule TAKE 1 CAPSULE  BY MOUTH TWICE A DAY 11/02/17   [name], MD  [facility] DELICA LANCETS 74J MISC USE TO TEST ONCE DAILY 10/14/17   [name], MD  oxyCODONE-acetaminophen (PERCOCET) 10-325 MG tablet Take one tablet two times daily for chronic back pain 11/28/17 12/29/17  [name], MD  SUMAtriptan (IMITREX) 5 MG/ACT nasal spray Place 1 spray into the nose every 2 (two) hours as needed. 01/16/17   [provider]  topiramate (TOPAMAX) 100 MG tablet TAKE 1 TABLET BY MOUTH TWICE A DAY 09/12/17   [name], MD  VENTOLIN HFA 108 (90 Base) MCG/ACT inhaler INHALE 2 PUFFS INTO THE LUNGS EVERY 6 (SIX) HOURS AS NEEDED FOR WHEEZING OR SHORTNESS OF BREATH. 05/26/16   [name], MD  Vitamin D, Ergocalciferol, (DRISDOL) 50000 units CAPS capsule TAKE 1 CAPSULE (50,000 UNITS TOTAL) BY MOUTH ONCE A WEEK. 11/18/17   [name], MD    Family History Family History  Problem Relation Age of Onset  . Diabetes Mother   . Hypertension Mother   . Arthritis Mother   . Heart failure Mother   . Migraines Mother   . Kidney failure Mother   . Leukemia Father   . Migraines Father   . Kidney failure Father   . Diabetes Sister   . Hypertension Sister   . Diabetes Brother   . Hypertension Brother   . Hypertension Brother   . Hypertension Brother   . Hypertension Brother   . Diabetes Brother   . Diabetes Brother   . Diabetes Brother   . Pulmonary embolism Sister   . Migraines Sister   . Kidney failure Brother   . Colon cancer Neg Hx   . Colon polyps Neg Hx     Social History Social History   Tobacco Use  . Smoking status: Never Smoker  . Smokeless tobacco: Never Used  Substance Use Topics  . Alcohol use: No  . Drug use: No     Allergies   Penicillins; Fentanyl; Sulfonamide derivatives; and Codeine   Review of Systems Review of Systems  Constitutional:       Per HPI, otherwise negative  HENT:  Per HPI, otherwise negative  Respiratory:        Per HPI, otherwise negative  Cardiovascular:       Per HPI, otherwise negative  Gastrointestinal: Negative for vomiting.  Endocrine:       Negative aside from HPI  Genitourinary:       Neg aside from HPI   Musculoskeletal:       Per HPI, otherwise negative  Skin: Negative.   Neurological: Positive for weakness. Negative for syncope.     Physical Exam Updated Vital Signs BP 128/73   Pulse 88   Temp 98.1 F (36.7 C) (Oral)   Resp 18   Ht 5' 7"  (1.702 m)   Wt 122.5 kg   SpO2 100%   BMI 42.29 kg/m   Physical Exam  Constitutional: She is oriented to person, place, and time. She has a sickly appearance. No distress.  Obese yet sickly appearing elderly female awake, alert, speaking briefly  HENT:  Head: Normocephalic and atraumatic.  Eyes: Conjunctivae and EOM are normal.  Cardiovascular: Normal rate and regular rhythm.  Pulmonary/Chest: Effort normal and breath sounds normal. No stridor. No respiratory distress.  Abdominal: She exhibits no distension.    Musculoskeletal: She exhibits no edema.  Neurological: She is alert and oriented to person, place, and time. She displays atrophy. She displays no tremor. No cranial nerve deficit. She exhibits normal muscle tone. She displays no seizure activity.  Skin: Skin is warm and dry.  Psychiatric: She is slowed and withdrawn.  Nursing note and vitals reviewed.    ED Treatments / Results  Labs (all labs ordered are listed, but only abnormal results are displayed) Labs Reviewed  URINALYSIS, ROUTINE W REFLEX MICROSCOPIC - Abnormal; Notable for the following components:      Result Value   Color, Urine AMBER (*)    APPearance CLOUDY (*)    Hgb urine dipstick LARGE (*)    Protein, ur 30 (*)    Leukocytes, UA LARGE (*)    RBC / HPF >50 (*)    WBC, UA >50 (*)    All other components within normal limits  PROTIME-INR - Abnormal; Notable for the following components:   Prothrombin Time 18.7 (*)    All other components within  normal limits  CBC WITH DIFFERENTIAL/PLATELET - Abnormal; Notable for the following components:   RBC 3.58 (*)    Hemoglobin 9.5 (*)    HCT 31.7 (*)    Platelets 410 (*)    All other components within normal limits  I-STAT CHEM 8, ED - Abnormal; Notable for the following components:   Creatinine, Ser 1.30 (*)    Glucose, Bld 184 (*)    Hemoglobin 10.2 (*)    HCT 30.0 (*)    All other components within normal limits  CBG MONITORING, ED - Abnormal; Notable for the following components:   Glucose-Capillary 141 (*)    All other components within normal limits  URINE CULTURE     Procedures Procedures (including critical care time)  Medications Ordered in ED Medications  fosfomycin (MONUROL) packet 3 g (has no administration in time range)     Initial Impression / Assessment and Plan / ED Course  I have reviewed the triage vital signs and the nursing notes.  Pertinent labs & imaging results that were available during my care of the patient were reviewed by me and considered in my medical decision making (see chart for details).    I reviewed the labs, and given the patient's  description of dysuria, leukocyte positive urine with substantial white blood cells, patient will receive therapy for urinary tract infection   1:33 PM On repeat exam the patient is awake alert, in no distress. We discussed all findings including reassuring labs, with mild hemoglobin drop, but no hemodynamic instability. Patient's bleeding may be secondary to Pradaxa versus mild urinary tract infection, but there is no evidence for bacteremia or sepsis. Patient will be urine culture sent, will receive fosfomycin, will follow up with primary care this week.   Final Clinical Impressions(s) / ED Diagnoses  UTI Hematuria   Carmin Muskrat, MD 12/18/17 1334

## 2017-12-18 NOTE — Discharge Instructions (Addendum)
As discussed, your evaluation today has been largely reassuring.  But, it is important that you monitor your condition carefully, and do not hesitate to return to the ED if you develop new, or concerning changes in your condition. ? ?Otherwise, please follow-up with your physician for appropriate ongoing care. ? ?

## 2017-12-20 LAB — URINE CULTURE

## 2017-12-21 ENCOUNTER — Telehealth: Payer: Self-pay | Admitting: Family Medicine

## 2017-12-21 ENCOUNTER — Telehealth: Payer: Self-pay | Admitting: Emergency Medicine

## 2017-12-21 ENCOUNTER — Encounter: Payer: Self-pay | Admitting: Family Medicine

## 2017-12-21 ENCOUNTER — Ambulatory Visit (INDEPENDENT_AMBULATORY_CARE_PROVIDER_SITE_OTHER): Payer: PPO | Admitting: Family Medicine

## 2017-12-21 VITALS — BP 128/84 | HR 83 | Resp 16 | Ht 67.0 in | Wt 231.0 lb

## 2017-12-21 DIAGNOSIS — Z23 Encounter for immunization: Secondary | ICD-10-CM

## 2017-12-21 DIAGNOSIS — E790 Hyperuricemia without signs of inflammatory arthritis and tophaceous disease: Secondary | ICD-10-CM | POA: Diagnosis not present

## 2017-12-21 DIAGNOSIS — E1165 Type 2 diabetes mellitus with hyperglycemia: Secondary | ICD-10-CM | POA: Diagnosis not present

## 2017-12-21 DIAGNOSIS — I10 Essential (primary) hypertension: Secondary | ICD-10-CM

## 2017-12-21 DIAGNOSIS — G8929 Other chronic pain: Secondary | ICD-10-CM

## 2017-12-21 DIAGNOSIS — B369 Superficial mycosis, unspecified: Secondary | ICD-10-CM | POA: Diagnosis not present

## 2017-12-21 DIAGNOSIS — B3789 Other sites of candidiasis: Secondary | ICD-10-CM | POA: Diagnosis not present

## 2017-12-21 DIAGNOSIS — Z794 Long term (current) use of insulin: Secondary | ICD-10-CM

## 2017-12-21 DIAGNOSIS — Z7901 Long term (current) use of anticoagulants: Secondary | ICD-10-CM

## 2017-12-21 DIAGNOSIS — IMO0001 Reserved for inherently not codable concepts without codable children: Secondary | ICD-10-CM

## 2017-12-21 DIAGNOSIS — E559 Vitamin D deficiency, unspecified: Secondary | ICD-10-CM

## 2017-12-21 DIAGNOSIS — E785 Hyperlipidemia, unspecified: Secondary | ICD-10-CM | POA: Diagnosis not present

## 2017-12-21 MED ORDER — CIPROFLOXACIN HCL 500 MG PO TABS: 500.0000 mg | ORAL_TABLET | Freq: Two times a day (BID) | ORAL | 0 refills | Status: DC

## 2017-12-21 MED ORDER — NYSTATIN 100000 UNIT/GM EX POWD: CUTANEOUS | 0 refills | Status: DC

## 2017-12-21 MED ORDER — CLOTRIMAZOLE-BETAMETHASONE 1-0.05 % EX CREA: TOPICAL_CREAM | CUTANEOUS | 1 refills | Status: DC

## 2017-12-21 MED ORDER — FLUCONAZOLE 150 MG PO TABS: 150.0000 mg | ORAL_TABLET | Freq: Once | ORAL | 0 refills | Status: AC

## 2017-12-21 MED ORDER — TEMAZEPAM 30 MG PO CAPS: 30.0000 mg | ORAL_CAPSULE | Freq: Every evening | ORAL | 3 refills | Status: DC | PRN

## 2017-12-21 NOTE — Telephone Encounter (Signed)
Post ED Visit - Positive Culture Follow-up  Culture report reviewed by antimicrobial stewardship pharmacist:  []  Elenor Quinones, Pharm.D. []  Heide Guile, Pharm.D., BCPS AQ-ID []  Parks Neptune, Pharm.D., BCPS []  Alycia Rossetti, Pharm.D., BCPS []  [place], [place].D., BCPS, AAHIVP []  Legrand Como, Pharm.D., BCPS, AAHIVP []  Salome Arnt, PharmD, BCPS []  Johnnette Gourd, PharmD, BCPS []  Hughes Better, PharmD, BCPS []  Leeroy Cha, PharmD Drema Dallas PharmD  Positive urine culture Treated with fosfomycin, organism sensitive to the same and no further patient follow-up is required at this time.  Hazle Nordmann 12/21/2017, 10:35 AM

## 2017-12-21 NOTE — Progress Notes (Signed)
Fill Date ID Written Drug Qty Days Prescriber Rx # Pharmacy Refill Daily Dose * Pymt Type PMP  11/28/2017  1   10/01/2017  Oxycodone-Acetaminophen 10-325  60.00 30 Ma Sim  [id number]  [place] ([id number])  0/0 30.00 MME Medicare  [place]  10/29/2017  1   10/01/2017  Oxycodone-Acetaminophen 10-325  60.00 30 Ma Sim  [id number]  [place] ([id number])  0/0 30.00 MME Medicare  [place]  09/28/2017  1   09/26/2017  Oxycodone-Acetaminophen 10-325  60.00 30 Ma Sim  [id number]  [place] ([id number])  0/0 30.00 MME Medicare  [place]  08/30/2017  1   08/30/2017  Oxycodone-Acetaminophen 10-325  60.00 30 Ma Sim  [id number]  [place] ([id number])  0/0 30.00 MME Medicare  [place]  07/27/2017  1   06/13/2017  Oxycodone-Acetaminophen 10-325  60.00 30 Ma Sim  [id number]  [place] ([id number])  0/0 30.00 MME Medicare  [place]  06/25/2017  1   04/12/2017  Oxycodone-Acetaminophen 10-325  60.00 30 Ma Sim  [id number]  [place] ([id number])  0/0 30.00 MME Private Pay  [place]  05/26/2017  1   04/12/2017  Oxycodone-Acetaminophen 10-325  60.00 30 Ma Sim  [id number]  [place] ([id number])  0/0 30.00 MME Private Pay  [place]  04/26/2017  1   04/12/2017  Oxycodone-Acetaminophen 10-325  60.00 30 Ma Sim  [id number]  [place] ([id number])  0/0 30.00 MME Private Pay  [place]  04/14/2017  1   04/14/2017  Hydrocodone-Acetamin 5-325 Mg  10.00 2 Ma Jam  [id number]  [place] ([id number])  0/0 25.00 MME Medicare  [place]  04/12/2017  1   04/12/2017  Temazepam 30 Mg Capsule  30.00 30 Ma Sim  [id number]  [place] ([id number])  0/5 1.50 LME Private Pay  [place]  03/25/2017  1   01/11/2017  Oxycodone-Acetaminophen 10-325  60.00 30 Ma Sim  [id number]  [place] ([id number])  0/0 30.00 MME Private Pay  [place]  02/23/2017  1   01/11/2017  Oxycodone-Acetaminophen 10-325  60.00 30 Ma Sim  [id number]  [place] ([id number])  0/0 30.00 MME Private Pay  [place]  01/22/2017  1   01/11/2017  Oxycodone-Acetaminophen 10-325  60.00 30 Ma Sim  [id number]  [place] ([id number])  0/0 30.00 MME Medicare  [place]  12/23/2016  1   10/05/2016  Oxycodone-Acetaminophen 10-325  60.00 30 Ma Sim  [id number]  [place] ([id number])  0/0 30.00 MME Medicare  [place]  11/20/2016  1   10/05/2016  Oxycodone-Acetaminophen  10-325  60.00 30 Ma Sim  [id number]  [place] ([id number])  0/0 30.00 MME Medicare  [place]  10/18/2016  1   10/05/2016  Oxycodone-Acetaminophen 10-325  60.00 30 Ma Sim  [id number]  [place] ([id number])  0/0 30.00 MME Medicare  [place]  09/18/2016  1   09/13/2016  Oxycodone-Acetaminophen 10-325  60.00 20 Ma Sim  [id number]  [place] ([id number])  0/0 45.00 MME Medicare  [place]  08/18/2016  1   08/14/2016  Oxycodone-Acetaminophen 10-325  60.00 30 Ma Sim  [id number]  [place] ([id number])  0/0 30.00 MME Medicare  [place]  07/16/2016  1   04/12/2016  Temazepam 30 Mg Capsule  30.00 30 Ma Sim  [id number]  [place] ([id number])  0/5 1.50 LME Medicare  [place]  07/15/2016  1   07/14/2016  Oxycodone-Acetaminophen 10-325  60.00 30 Ma Sim  [id number]  [place] ([id number])  0/0 30.00 MME Medicare  [place]  06/14/2016  1   06/14/2016  Oxycodone-Acetaminophen 10-325  60.00 30 Ma Sim  [id number]  [place] ([id number])  0/0 30.00 MME Private Pay  [facility]  05/11/2016  1   04/12/2016  Oxycodone-Acetaminophen 10-325  60.00 30 Ma Sim  [id number]  [place] ([id number])  0/0 30.00 MME Private Pay  [place]  04/14/2016  1   04/14/2016  Oxycodone-Acetaminophen 10-325  60.00 30 Ma Sim  [id number]  [place] ([id number])  0/0 30.00 MME Private Pay  [place]  04/14/2016  1   04/14/2016  Hydrocodone-Chlorphen Er Susp  115.00 23 Ma Sim  [id number]  [place] ([id number])  0/0 10.00 MME Private Pay  [place]  04/12/2016  1   10/31/2015  Temazepam 30 Mg Capsule

## 2017-12-21 NOTE — Telephone Encounter (Signed)
Please call [facility] @ [phone] ref number [id number] for Pt medication

## 2017-12-21 NOTE — Patient Instructions (Addendum)
Annual exam in 12.5 weeks, call if you ned me befopre  Flu vaccine today  Take script for restoril with you please  5 days of antibiotic prescribed for uTI   Cream and powder will be sent for skin   You are referred to cardiology, call for appt  Labs today lipid, cmp and EGFR, hBA1C, uric acid level  Thank you  for choosing [facility]. We consider it a privelige to serve you.  Delivering excellent health care in a caring and  compassionate way is our goal.  Partnering with you,  so that together we can achieve this goal is our strategy.

## 2017-12-22 LAB — COMPLETE METABOLIC PANEL WITH GFR
AG RATIO: 1.3 (calc) (ref 1.0–2.5)
ALT: 10 U/L (ref 6–29)
AST: 12 U/L (ref 10–35)
Albumin: 3.9 g/dL (ref 3.6–5.1)
Alkaline phosphatase (APISO): 91 U/L (ref 33–130)
BUN/Creatinine Ratio: 13 (calc) (ref 6–22)
BUN: 15 mg/dL (ref 7–25)
CALCIUM: 9.5 mg/dL (ref 8.6–10.4)
CO2: 25 mmol/L (ref 20–32)
Chloride: 105 mmol/L (ref 98–110)
Creat: 1.15 mg/dL — ABNORMAL HIGH (ref 0.60–0.93)
GFR, EST AFRICAN AMERICAN: 53 mL/min/{1.73_m2} — AB (ref >=60)
GFR, EST NON AFRICAN AMERICAN: 46 mL/min/{1.73_m2} — AB (ref >=60)
GLOBULIN: 3.1 g/dL (ref 1.9–3.7)
Glucose, Bld: 136 mg/dL — ABNORMAL HIGH (ref 65–99)
POTASSIUM: 4.8 mmol/L (ref 3.5–5.3)
SODIUM: 138 mmol/L (ref 135–146)
TOTAL PROTEIN: 7 g/dL (ref 6.1–8.1)
Total Bilirubin: 0.2 mg/dL (ref 0.2–1.2)

## 2017-12-22 LAB — HEMOGLOBIN A1C
EAG (MMOL/L): 10.4 (calc)
Hgb A1c MFr Bld: 8.2 % of total Hgb — ABNORMAL HIGH (ref <5.7)
Mean Plasma Glucose: 189 (calc)

## 2017-12-22 LAB — LIPID PANEL
CHOLESTEROL: 154 mg/dL (ref <200)
HDL: 60 mg/dL (ref >50)
LDL Cholesterol (Calc): 72 mg/dL (calc)
Non-HDL Cholesterol (Calc): 94 mg/dL (calc) (ref <130)
Total CHOL/HDL Ratio: 2.6 (calc) (ref <5.0)
Triglycerides: 135 mg/dL (ref <150)

## 2017-12-22 LAB — URIC ACID: Uric Acid, Serum: 4.5 mg/dL (ref 2.5–7.0)

## 2017-12-22 NOTE — Telephone Encounter (Signed)
[name] left voice message returning call  Call back [phone] Case # [id number]

## 2017-12-22 NOTE — Telephone Encounter (Signed)
Returned called, no answer, left message.

## 2017-12-22 NOTE — Telephone Encounter (Signed)
Spoke with Estill Bamberg with [employer]( patient's medicare Part D plan) and reviewed medications

## 2017-12-23 ENCOUNTER — Other Ambulatory Visit: Payer: Self-pay | Admitting: Family Medicine

## 2017-12-24 MED ORDER — OXYCODONE-ACETAMINOPHEN 10-325 MG PO TABS: ORAL_TABLET | ORAL | 0 refills | Status: AC

## 2017-12-24 MED ORDER — OXYCODONE-ACETAMINOPHEN 10-325 MG PO TABS: ORAL_TABLET | ORAL | 0 refills | Status: DC

## 2017-12-24 NOTE — Assessment & Plan Note (Signed)
Controlled, no change in medication DASH diet and commitment to daily physical activity for a minimum of 30 minutes discussed and encouraged, as a part of hypertension management. The importance of attaining a healthy weight is also discussed.  BP/Weight 12/21/2017 12/18/2017 09/26/2017 09/02/2017 08/10/2017 08/02/2017 0/35/2481  Systolic BP 859 093 112 162 446 950 722  Diastolic BP 84 68 82 97 88 80 90  Wt. (Lbs) 231 270 220.08 218 225 223.12 213.12  BMI 36.18 42.29 34.47 34.14 35.24 34.95 33.38

## 2017-12-24 NOTE — Assessment & Plan Note (Signed)
Clotrimazole betamethasone prescribed for twice daily, as needed, use

## 2017-12-24 NOTE — Assessment & Plan Note (Signed)
Improved, slight increase in lantus dose recommended, very encouraged , keep up the great work [name] is reminded of the importance of commitment to daily physical activity for 30 minutes or more, as able and the need to limit carbohydrate intake to 30 to 60 grams per meal to help with blood sugar control.   The need to take medication as prescribed, test blood sugar as directed, and to call between visits if there is a concern that blood sugar is uncontrolled is also discussed.   [name] is reminded of the importance of daily foot exam, annual eye examination, and good blood sugar, blood pressure and cholesterol control.  Diabetic Labs Latest Ref Rng & Units 12/21/2017 12/18/2017 09/26/2017 09/02/2017 06/21/2017  HbA1c <5.7 % of total Hgb 8.2(H) - 9.0(H) - -  Microalbumin mg/dL - - - - -  Micro/Creat Ratio <30 mcg/mg creat - - - - -  Chol <200 mg/dL 154 - 141 - -  HDL >50 mg/dL 60 - 60 - -  Calc LDL mg/dL (calc) 72 - 59 - -  Triglycerides <150 mg/dL 135 - 134 - -  Creatinine 0.60 - 0.93 mg/dL 1.15(H) 1.30(H) 1.18(H) 1.39(H) 1.27(H)   BP/Weight 12/21/2017 12/18/2017 09/26/2017 09/02/2017 08/10/2017 08/02/2017 7/86/7672  Systolic BP 094 709 628 366 294 765 465  Diastolic BP 84 68 82 97 88 80 90  Wt. (Lbs) 231 270 220.08 218 225 223.12 213.12  BMI 36.18 42.29 34.47 34.14 35.24 34.95 33.38   Foot/eye exam completion dates Latest Ref Rng & Units 08/10/2017 07/14/2016  Eye Exam No Retinopathy - -  Foot exam Order - - -  Foot Form Completion - Done Done

## 2017-12-24 NOTE — Assessment & Plan Note (Signed)
Hyperlipidemia:Low fat diet discussed and encouraged.   Lipid Panel  Lab Results  Component Value Date   CHOL 154 12/21/2017   HDL 60 12/21/2017   LDLCALC 72 12/21/2017   TRIG 135 12/21/2017   CHOLHDL 2.6 12/21/2017   Controlled, no change in medication

## 2017-12-24 NOTE — Progress Notes (Signed)
[name]     MRN: [id number]      DOB: [birth year]   HPI [name] is here for follow up and re-evaluation of chronic medical conditions, medication management and review of any available recent lab and radiology data.  Preventive health is updated, specifically  Cancer screening and Immunization.   Was unable to get epidural as she is on chronic blood thinner The PT denies any adverse reactions to current medications since the last visit.  Needs topical medication for fungal and yeast infection under her breasts which  Recurs. Blood sugar averages 150 to 170 Denies polyuria, polydipsia, blurred vision , or hypoglycemic episodes.    ROS Denies recent fever or chills. Denies sinus pressure, nasal congestion, ear pain or sore throat. Denies chest congestion, productive cough or wheezing. Denies chest pains, palpitations and leg swelling Denies abdominal pain, nausea, vomiting,diarrhea or constipation.   Denies dysuria, frequency, hesitancy or incontinence. C/o  joint pain, swelling and limitation in mobility.Pain management is adequate C/o intermittent  Headaches,c/o  numbness, and  Tingling.of legsDenies depression, anxiety or insomnia.  PE  BP 128/84   Pulse 83   Resp 16   Ht 5\' 7"  (1.702 m)   Wt 231 lb (104.8 kg)   SpO2 93%   BMI 36.18 kg/m   Patient alert and oriented and in no cardiopulmonary distress.  HEENT: No facial asymmetry, EOMI,   oropharynx pink and moist.  Neck supple no JVD, no mass.  Chest: Clear to auscultation bilaterally.  CVS: S1, S2 no murmurs, no S3.Regular rate.  ABD: Soft non tender.   Ext: No edema  MS: Adequate ROM spine, shoulders, hips and knees.  Skin: Intact, mild candidiasis and tinea under both  breasts Psych: Good eye contact, normal affect. Memory intact not anxious or depressed appearing.  CNS: CN 2-12 intact, power,  normal throughout.no focal deficits noted.   Assessment & Plan  Diabetes mellitus, insulin dependent  (IDDM), uncontrolled (HCC) Improved, slight increase in lantus dose recommended, very encouraged , keep up the great work [name] is reminded of the importance of commitment to daily physical activity for 30 minutes or more, as able and the need to limit carbohydrate intake to 30 to 60 grams per meal to help with blood sugar control.   The need to take medication as prescribed, test blood sugar as directed, and to call between visits if there is a concern that blood sugar is uncontrolled is also discussed.   [name] is reminded of the importance of daily foot exam, annual eye examination, and good blood sugar, blood pressure and cholesterol control.  Diabetic Labs Latest Ref Rng & Units 12/21/2017 12/18/2017 09/26/2017 09/02/2017 06/21/2017  HbA1c <5.7 % of total Hgb 8.2(H) - 9.0(H) - -  Microalbumin mg/dL - - - - -  Micro/Creat Ratio <30 mcg/mg creat - - - - -  Chol <200 mg/dL 154 - 141 - -  HDL >50 mg/dL 60 - 60 - -  Calc LDL mg/dL (calc) 72 - 59 - -  Triglycerides <150 mg/dL 135 - 134 - -  Creatinine 0.60 - 0.93 mg/dL 1.15(H) 1.30(H) 1.18(H) 1.39(H) 1.27(H)   BP/Weight 12/21/2017 12/18/2017 09/26/2017 09/02/2017 08/10/2017 08/02/2017 1/60/7371  Systolic BP 062 694 854 627 035 009 381  Diastolic BP 84 68 82 97 88 80 90  Wt. (Lbs) 231 270 220.08 218 225 223.12 213.12  BMI 36.18 42.29 34.47 34.14 35.24 34.95 33.38   Foot/eye exam completion dates Latest Ref Rng &  Units 08/10/2017 07/14/2016  Eye Exam No Retinopathy - -  Foot exam Order - - -  Foot Form Completion - Done Done        Morbid obesity ([place]) Deteriorated. Patient re-educated about  the importance of commitment to a  minimum of 150 minutes of exercise per week.  The importance of healthy food choices with portion control discussed. Encouraged to start a food diary, count calories and to consider  joining a support group. Sample diet sheets offered. Goals set by the patient for the next several months.   Weight /BMI 12/21/2017  12/18/2017 09/26/2017  WEIGHT 231 lb 270 lb 220 lb 1.3 oz  HEIGHT 5\' 7"  5\' 7"  5\' 7"   BMI 36.18 kg/m2 42.29 kg/m2 34.47 kg/m2      Essential hypertension Controlled, no change in medication DASH diet and commitment to daily physical activity for a minimum of 30 minutes discussed and encouraged, as a part of hypertension management. The importance of attaining a healthy weight is also discussed.  BP/Weight 12/21/2017 12/18/2017 09/26/2017 09/02/2017 08/10/2017 08/02/2017 02/25/7354  Systolic BP 701 410 301 314 388 875 797  Diastolic BP 84 68 82 97 88 80 90  Wt. (Lbs) 231 270 220.08 218 225 223.12 213.12  BMI 36.18 42.29 34.47 34.14 35.24 34.95 33.38       Dyslipidemia Hyperlipidemia:Low fat diet discussed and encouraged.   Lipid Panel  Lab Results  Component Value Date   CHOL 154 12/21/2017   HDL 60 12/21/2017   LDLCALC 72 12/21/2017   TRIG 135 12/21/2017   CHOLHDL 2.6 12/21/2017   Controlled, no change in medication     Encounter for chronic pain management The patient's Controlled Substance registry is reviewed and compliance confirmed. Adequacy of  Pain control and level of function is assessed. Medication dosing is adjusted as deemed appropriate. Twelve weeks of medication is prescribed , patient signs for the script and is provided with a follow up appointment between 11 to 12 weeks .   Candidiasis of breast Topical nystatin prescribed  Dermatomycosis Clotrimazole betamethasone prescribed for twice daily, as needed, use

## 2017-12-24 NOTE — Assessment & Plan Note (Signed)
Topical nystatin prescribed 

## 2017-12-24 NOTE — Assessment & Plan Note (Signed)
Deteriorated. Patient re-educated about  the importance of commitment to a  minimum of 150 minutes of exercise per week.  The importance of healthy food choices with portion control discussed. Encouraged to start a food diary, count calories and to consider  joining a support group. Sample diet sheets offered. Goals set by the patient for the next several months.   Weight /BMI 12/21/2017 12/18/2017 09/26/2017  WEIGHT 231 lb 270 lb 220 lb 1.3 oz  HEIGHT 5\' 7"  5\' 7"  5\' 7"   BMI 36.18 kg/m2 42.29 kg/m2 34.47 kg/m2

## 2017-12-24 NOTE — Assessment & Plan Note (Signed)
The patient's Controlled Substance registry is reviewed and compliance confirmed. Adequacy of  Pain control and level of function is assessed. Medication dosing is adjusted as deemed appropriate. Twelve weeks of medication is prescribed , patient signs for the script and is provided with a follow up appointment between 11 to 12 weeks .  

## 2017-12-27 ENCOUNTER — Ambulatory Visit: Payer: Self-pay | Admitting: Urology

## 2017-12-28 ENCOUNTER — Telehealth: Payer: Self-pay

## 2017-12-28 DIAGNOSIS — E1165 Type 2 diabetes mellitus with hyperglycemia: Secondary | ICD-10-CM

## 2017-12-28 DIAGNOSIS — Z794 Long term (current) use of insulin: Secondary | ICD-10-CM

## 2017-12-28 DIAGNOSIS — IMO0001 Reserved for inherently not codable concepts without codable children: Secondary | ICD-10-CM

## 2017-12-28 DIAGNOSIS — I10 Essential (primary) hypertension: Secondary | ICD-10-CM

## 2017-12-28 NOTE — Telephone Encounter (Signed)
Called and advised of note and labs ordered

## 2018-01-05 ENCOUNTER — Encounter: Payer: Self-pay | Admitting: Cardiology

## 2018-01-05 ENCOUNTER — Ambulatory Visit: Payer: PPO | Admitting: Cardiology

## 2018-01-05 VITALS — BP 158/80 | HR 101 | Ht 67.0 in | Wt 234.0 lb

## 2018-01-05 DIAGNOSIS — R011 Cardiac murmur, unspecified: Secondary | ICD-10-CM

## 2018-01-05 DIAGNOSIS — Z7901 Long term (current) use of anticoagulants: Secondary | ICD-10-CM

## 2018-01-05 DIAGNOSIS — D649 Anemia, unspecified: Secondary | ICD-10-CM | POA: Diagnosis not present

## 2018-01-05 DIAGNOSIS — Z86711 Personal history of pulmonary embolism: Secondary | ICD-10-CM

## 2018-01-05 LAB — CBC WITH DIFFERENTIAL/PLATELET
BASOS ABS: 42 {cells}/uL (ref 0–200)
Basophils Relative: 0.4 %
EOS ABS: 31 {cells}/uL (ref 15–500)
Eosinophils Relative: 0.3 %
HCT: 29.6 % — ABNORMAL LOW (ref 35.0–45.0)
Hemoglobin: 9.4 g/dL — ABNORMAL LOW (ref 11.7–15.5)
Lymphs Abs: 3068 cells/uL (ref 850–3900)
MCH: 26.3 pg — AB (ref 27.0–33.0)
MCHC: 31.8 g/dL — AB (ref 32.0–36.0)
MCV: 82.7 fL (ref 80.0–100.0)
MPV: 9.7 fL (ref 7.5–12.5)
Monocytes Relative: 6.8 %
NEUTROS PCT: 63 %
Neutro Abs: 6552 cells/uL (ref 1500–7800)
PLATELETS: 529 10*3/uL — AB (ref 140–400)
RBC: 3.58 10*6/uL — AB (ref 3.80–5.10)
RDW: 13.7 % (ref 11.0–15.0)
TOTAL LYMPHOCYTE: 29.5 %
WBC: 10.4 10*3/uL (ref 3.8–10.8)
WBCMIX: 707 {cells}/uL (ref 200–950)

## 2018-01-05 NOTE — Progress Notes (Signed)
Clinical Summary [name] is a 77 y.o.female seen today for follow up of the following medical problems.   1. History of PE - occurred in 06/2010, per notes appeared to be unprovoked. Has been on chronic anticoagulation since. Denies any problems with bleeding.  - on chronic anticoagulation - INRs were labile on coumadin, switched to xarelto.  - reports xarelto caused diffuse numbness, changed to pradaxa which she is tolerating well   - has had some intermittent hematuria over the last few weeks. Being treated for UTI by pcp - take bc powder daily over the last 2 weeks.   2. HTN - she is compliant withmeds  3. Hyperlipidemia  - 12/2017 TC 154 HDL 60 TG 135 LDL 72  4. Chest pain - seen in ER 07/09/14 with chest pain - from notes pain was reproducible on exam, - CXR no acute process, EKG no ischemic changes, trop neg, D-dimer negative.   10/2016 nuclear stress no ischemia, nonspecific elevated TID - no recent symptoms    Past Medical History:  Diagnosis Date  . Anemia, iron deficiency 2012   Evaluated by Dr. Oneida Alar; H&H of 9.3/30.8 with [place] in 10/2010; 3/3 positive Hemoccult cards in 07/2011  . Anxiety    was on Prozac for a couple of weeks  . Atrial fibrillation ([place])    takes Coumadin daily  . Bruises easily    takes Coumadin  . Chronic anticoagulation 07/21/2010  . Chronic back pain    related to knee pain  . Degenerative joint disease    Knees  . Depression   . Diabetes mellitus    takes Metformin daily  . Gastroesophageal reflux disease    takes Omeprazole daily  . Glaucoma   . Glaucoma    uses eye drops at night  . History of blood transfusion 1969  . History of gout    was on medication but taken off of several months ago  . Hx of colonic polyps   . Hx of migraines    last one about a yr ago;takes Topamax daily  . Hyperlipidemia    takes Pravastatin daily  . Hypertension    takes Hyzaar daily and Propranolol   . Insomnia    takes  Restoril prn  . Joint pain   . Joint swelling   . Memory loss    takes donepezil  . Migraine   . Migraine   . Nocturia   . Overweight(278.02)   . Pneumonia 1969   hx of  . Pulmonary embolism [facility]) May 2012   acute presentation, bilateral PE  . Skin spots-aging   . Urinary frequency        Current Outpatient Medications  Medication Sig Dispense Refill  . acyclovir (ZOVIRAX) 400 MG tablet TAKE 1 TABLET (400 MG TOTAL) BY MOUTH 3 (THREE) TIMES DAILY. 30 tablet 5  . amLODipine (NORVASC) 10 MG tablet TAKE 1 TABLET BY MOUTH EVERY DAY 90 tablet 1  . atorvastatin (LIPITOR) 40 MG tablet TAKE 1 TABLET BY MOUTH EVERY DAY 90 tablet 1  . B-D UF III MINI PEN NEEDLES 31G X 5 MM MISC USE WITH LANTUS AS DIRECTED 100 each 0  . Blood Glucose Monitoring Suppl (ACCU-CHEK AVIVA PLUS) w/Device KIT For once daily testing dx E11.9 1 kit 0  . busPIRone (BUSPAR) 7.5 MG tablet TAKE 1 TABLET BY MOUTH 3 TIMES DAILY 270 tablet 1  . ciprofloxacin (CIPRO) 500 MG tablet Take 1 tablet (500 mg total) by mouth 2 (  two) times daily. 10 tablet 0  . cloNIDine (CATAPRES) 0.2 MG tablet TAKE 1 TABLET (0.2 MG TOTAL) BY MOUTH 3 (THREE) TIMES DAILY. 270 tablet 1  . clotrimazole-betamethasone (LOTRISONE) cream Apply twice daily to rash under breast as needed for 1 week , then if recurrs 45 g 1  . colchicine 0.6 MG tablet Take 1 tablet (0.6 mg total) by mouth daily. May decrease to once per day as needed for diarrhea 10 tablet 0  . COMBIGAN 0.2-0.5 % ophthalmic solution Place 1 drop into both eyes 2 (two) times daily.  6  . conjugated estrogens (PREMARIN) vaginal cream Apply sparingly ( fingertip)  Twice weekly to vaginal area 42.5 g 0  . dabigatran (PRADAXA) 150 MG CAPS capsule TAKE ONE CAPSULE BY MOUTH EVERY 12 HOURS 60 capsule 0  . donepezil (ARICEPT) 10 MG tablet TAKE 1 TABLET BY MOUTH EVERYDAY AT BEDTIME 90 tablet 1  . Fluoxetine HCl, PMDD, 10 MG TABS One tablet once daily 30 each 5  . glucose blood (ONE TOUCH ULTRA TEST)  test strip USE TO TEST TWICE DAILY 100 each 5  . hydrOXYzine (ATARAX/VISTARIL) 25 MG tablet TAKE 1 TABLET BY MOUTH THREE TIMES A DAY AS NEEDED 30 tablet 3  . imipramine (TOFRANIL) 50 MG tablet TAKE 2 TABLETS (100 MG TOTAL) BY MOUTH AT BEDTIME. 180 tablet 1  . Insulin Glargine (LANTUS SOLOSTAR) 100 UNIT/ML Solostar Pen Inject 40 units under the skin twice daily 5 pen 3  . methocarbamol (ROBAXIN) 500 MG tablet Take 1 tablet (500 mg total) by mouth every 8 (eight) hours as needed for muscle spasms. 30 tablet 0  . montelukast (SINGULAIR) 10 MG tablet TAKE 1 TABLET BY MOUTH AT BEDTIME 90 tablet 1  . nystatin (MYCOSTATIN/NYSTOP) powder Apply twice daily to rash under breast for 1 wekk, then , as needed 45 g 0  . olopatadine (PATANOL) 0.1 % ophthalmic solution INSTILL 1 DROP INTO BOTH EYES TWICE A DAY 5 mL 1  . omeprazole (PRILOSEC) 40 MG capsule TAKE 1 CAPSULE BY MOUTH TWICE A DAY 180 capsule 1  . ONETOUCH DELICA LANCETS 24O MISC USE TO TEST ONCE DAILY 100 each 4  . oxyCODONE-acetaminophen (PERCOCET) 10-325 MG tablet Take one tablet two times daily for back pain 60 tablet 0  . [START ON 01/28/2018] oxyCODONE-acetaminophen (PERCOCET) 10-325 MG tablet Take one tablet two times daily for back pain 60 tablet 0  . [START ON 02/27/2018] oxyCODONE-acetaminophen (PERCOCET) 10-325 MG tablet Take one tablet two times daily for back pain 60 tablet 0  . SUMAtriptan (IMITREX) 5 MG/ACT nasal spray Place 1 spray into the nose every 2 (two) hours as needed.  2  . temazepam (RESTORIL) 30 MG capsule Take 1 capsule (30 mg total) by mouth at bedtime as needed for sleep. 30 capsule 3  . topiramate (TOPAMAX) 100 MG tablet TAKE 1 TABLET BY MOUTH TWICE A DAY 180 tablet 1  . VENTOLIN HFA 108 (90 Base) MCG/ACT inhaler INHALE 2 PUFFS INTO THE LUNGS EVERY 6 (SIX) HOURS AS NEEDED FOR WHEEZING OR SHORTNESS OF BREATH. 18 Inhaler 3  . Vitamin D, Ergocalciferol, (DRISDOL) 50000 units CAPS capsule TAKE 1 CAPSULE (50,000 UNITS TOTAL) BY  MOUTH ONCE A WEEK. 12 capsule 1   No current facility-administered medications for this visit.    Facility-Administered Medications Ordered in Other Visits  Medication Dose Route Frequency Provider Last Rate Last Dose  . fentaNYL (SUBLIMAZE) injection 25-50 mcg  25-50 mcg Intravenous Q5 min PRN Lerry Liner, MD  Past Surgical History:  Procedure Laterality Date  . CATARACT EXTRACTION W/PHACO Right 04/11/2012   Procedure: CATARACT EXTRACTION PHACO AND INTRAOCULAR LENS PLACEMENT (IOC);  Surgeon: Elta Guadeloupe T. Gershon Crane, MD;  Location: [facility];  Service: Ophthalmology;  Laterality: Right;  CDE=9.61  . CATARACT EXTRACTION W/PHACO Left 12/26/2012   Procedure: CATARACT EXTRACTION PHACO AND INTRAOCULAR LENS PLACEMENT (IOC);  Surgeon: Elta Guadeloupe T. Gershon Crane, MD;  Location: [facility];  Service: Ophthalmology;  Laterality: Left;  CDE:7.74  . COLONOSCOPY  Jan 2002; 2012   2002: Dr. Tamala Julian, ext. hemorrhoids, nl colon; 2012-adenomatous polyps, gastritis on EGD  . DILATION AND CURETTAGE OF UTERUS    . ESOPHAGOGASTRODUODENOSCOPY  08/24/2011   ESOPHAGOGASTRODUODENOSCOPY; esophageal dilatation; Rogene Houston, MD;  . Freda Munro CAPSULE STUDY  08/18/2011   Procedure: GIVENS CAPSULE STUDY;  Surgeon: Rogene Houston, MD;  Location: [facility];  Service: Endoscopy;  Laterality: N/A;  730  . KNEE ARTHROSCOPY W/ MENISCECTOMY  90's   Left  . ORIF ANKLE FRACTURE Right 90's  . TONSILLECTOMY    . 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 UPPER GASTROINTESTINAL ENDOSCOPY    . URETHRAL DILATION          Family History  Problem Relation Age of Onset  . Diabetes Mother   . Hypertension Mother   . Arthritis Mother   . Heart failure Mother   . Migraines Mother   . Kidney failure Mother   . Leukemia Father   . Migraines Father   . Kidney failure Father   . Diabetes Sister   . Hypertension Sister    . Diabetes Brother   . Hypertension Brother   . Hypertension Brother   . Hypertension Brother   . Hypertension Brother   . Diabetes Brother   . Diabetes Brother   . Diabetes Brother   . Pulmonary embolism Sister   . Migraines Sister   . Kidney failure Brother   . Colon cancer Neg Hx   . Colon polyps Neg Hx      Social History Ms. Reth reports that she has never smoked. She has never used smokeless tobacco. Ms. Cahoon reports that she does not drink alcohol.   Review of Systems CONSTITUTIONAL: No weight loss, fever, chills, weakness or fatigue.  HEENT: Eyes: No visual loss, blurred vision, double vision or yellow sclerae.No hearing loss, sneezing, congestion, runny nose or sore throat.  SKIN: No rash or itching.  CARDIOVASCULAR: per hpi RESPIRATORY: No shortness of breath, cough or sputum.  GASTROINTESTINAL: No anorexia, nausea, vomiting or diarrhea. No abdominal pain or blood.  GENITOURINARY: No burning on urination, no polyuria NEUROLOGICAL: No headache, dizziness, syncope, paralysis, ataxia, numbness or tingling in the extremities. No change in bowel or bladder control.  MUSCULOSKELETAL: No muscle, back pain, joint pain or stiffness.  LYMPHATICS: No enlarged nodes. No history of splenectomy.  PSYCHIATRIC: No history of depression or anxiety.  ENDOCRINOLOGIC: No reports of sweating, cold or heat intolerance. No polyuria or polydipsia.  Marland Kitchen   Physical Examination Vitals:   01/05/18 1405  BP: (!) 158/80  Pulse: (!) 101  SpO2: 96%   Vitals:   01/05/18 1405  Weight: 234 lb (106.1 kg)  Height: _0  (1.702 m)    Gen: resting comfortably, no acute distress HEENT: no scleral icterus, pupils equal round and reactive, no palptable cervical adenopathy,  CV: RRR, 2/6 systolic murmur rusb,  no jvd Resp: Clear to auscultation bilaterally GI: abdomen is soft, non-tender, non-distended, normal bowel sounds, no hepatosplenomegaly MSK: extremities are warm, no edema.  Skin:  warm, no rash Neuro:  no focal deficits Psych: appropriate affect   Diagnostic Studies  10/2016 nuclear stress  There was no ST segment deviation noted during stress.  The study is normal. There are no perfusion defects consistent with prior infarct or current ischemia.  This is a low risk study based on perfusion images  The left ventricular ejection fraction is hyperdynamic (>65%).  Nonspecific elevated TID in absence of significant perfusion defect Cannot exclude balanced ischemia.   Assessment and Plan  1. 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 Repeat cbc  2. HTN - repeat at goal 130/80, continue current meds  3. Hyperlipidemia - at goal, continue statin.   4. Heart murmur - obtain echo     Arnoldo Lenis, M.D.

## 2018-01-05 NOTE — Patient Instructions (Signed)
Medication Instructions:  .IONSTCUR   Labwork: TODAY   CBC  Testing/Procedures: Your physician has requested that you have an echocardiogram. Echocardiography is a painless test that uses sound waves to create images of your heart. It provides your doctor with information about the size and shape of your heart and how well your heart's chambers and valves are working. This procedure takes approximately one hour. There are no restrictions for this procedure.    Follow-Up: Your physician recommends that you schedule a follow-up appointment in: PENDING TEST    Any Other Special Instructions Will Be Listed Below (If Applicable).     If you need a refill on your cardiac medications before your next appointment, please call your pharmacy.

## 2018-01-06 ENCOUNTER — Encounter: Payer: Self-pay | Admitting: Cardiology

## 2018-01-17 ENCOUNTER — Ambulatory Visit (HOSPITAL_COMMUNITY)
Admission: RE | Admit: 2018-01-17 | Discharge: 2018-01-17 | Disposition: A | Discharge status: Home / Self Care | Payer: PPO | Source: Ambulatory Visit | Attending: Cardiology | Admitting: Cardiology

## 2018-01-17 ENCOUNTER — Encounter (HOSPITAL_COMMUNITY): Payer: Self-pay

## 2018-01-17 DIAGNOSIS — R011 Cardiac murmur, unspecified: Secondary | ICD-10-CM | POA: Insufficient documentation

## 2018-01-17 NOTE — Progress Notes (Signed)
Patient unable to complete echocardiogram due to broken skin and extreme pain under her left breast. Even with the lightest touch patient experienced pain. Dr. Harl Bowie off today. Patient asked to clear the area up and reschedule echo at a later date.   Alvino Chapel, RCS

## 2018-01-24 ENCOUNTER — Other Ambulatory Visit: Payer: Self-pay

## 2018-01-24 ENCOUNTER — Inpatient Hospital Stay (HOSPITAL_COMMUNITY)
Admission: EM | Admit: 2018-01-24 | Discharge: 2018-01-27 | DRG: 690 | Disposition: A | Discharge status: Home Health | Payer: PPO | Attending: Family Medicine | Admitting: Family Medicine

## 2018-01-24 ENCOUNTER — Emergency Department (HOSPITAL_COMMUNITY): Payer: PPO

## 2018-01-24 ENCOUNTER — Encounter (HOSPITAL_COMMUNITY): Payer: Self-pay | Admitting: Emergency Medicine

## 2018-01-24 DIAGNOSIS — Z87440 Personal history of urinary (tract) infections: Secondary | ICD-10-CM | POA: Diagnosis not present

## 2018-01-24 DIAGNOSIS — N390 Urinary tract infection, site not specified: Secondary | ICD-10-CM | POA: Diagnosis present

## 2018-01-24 DIAGNOSIS — F419 Anxiety disorder, unspecified: Secondary | ICD-10-CM | POA: Diagnosis not present

## 2018-01-24 DIAGNOSIS — J209 Acute bronchitis, unspecified: Secondary | ICD-10-CM | POA: Diagnosis present

## 2018-01-24 DIAGNOSIS — Z806 Family history of leukemia: Secondary | ICD-10-CM

## 2018-01-24 DIAGNOSIS — E119 Type 2 diabetes mellitus without complications: Secondary | ICD-10-CM | POA: Diagnosis not present

## 2018-01-24 DIAGNOSIS — Z8719 Personal history of other diseases of the digestive system: Secondary | ICD-10-CM

## 2018-01-24 DIAGNOSIS — Z841 Family history of disorders of kidney and ureter: Secondary | ICD-10-CM | POA: Diagnosis not present

## 2018-01-24 DIAGNOSIS — Z8261 Family history of arthritis: Secondary | ICD-10-CM

## 2018-01-24 DIAGNOSIS — H409 Unspecified glaucoma: Secondary | ICD-10-CM | POA: Diagnosis not present

## 2018-01-24 DIAGNOSIS — N3000 Acute cystitis without hematuria: Secondary | ICD-10-CM | POA: Diagnosis not present

## 2018-01-24 DIAGNOSIS — Z86718 Personal history of other venous thrombosis and embolism: Secondary | ICD-10-CM

## 2018-01-24 DIAGNOSIS — I10 Essential (primary) hypertension: Secondary | ICD-10-CM | POA: Diagnosis present

## 2018-01-24 DIAGNOSIS — R69 Illness, unspecified: Secondary | ICD-10-CM

## 2018-01-24 DIAGNOSIS — Z7901 Long term (current) use of anticoagulants: Secondary | ICD-10-CM

## 2018-01-24 DIAGNOSIS — Z79891 Long term (current) use of opiate analgesic: Secondary | ICD-10-CM

## 2018-01-24 DIAGNOSIS — I4891 Unspecified atrial fibrillation: Secondary | ICD-10-CM | POA: Diagnosis not present

## 2018-01-24 DIAGNOSIS — N179 Acute kidney failure, unspecified: Secondary | ICD-10-CM | POA: Diagnosis not present

## 2018-01-24 DIAGNOSIS — J111 Influenza due to unidentified influenza virus with other respiratory manifestations: Secondary | ICD-10-CM | POA: Diagnosis not present

## 2018-01-24 DIAGNOSIS — Z66 Do not resuscitate: Secondary | ICD-10-CM | POA: Diagnosis present

## 2018-01-24 DIAGNOSIS — Z86711 Personal history of pulmonary embolism: Secondary | ICD-10-CM | POA: Diagnosis not present

## 2018-01-24 DIAGNOSIS — D509 Iron deficiency anemia, unspecified: Secondary | ICD-10-CM | POA: Diagnosis not present

## 2018-01-24 DIAGNOSIS — R509 Fever, unspecified: Secondary | ICD-10-CM | POA: Diagnosis not present

## 2018-01-24 DIAGNOSIS — Z96652 Presence of left artificial knee joint: Secondary | ICD-10-CM | POA: Diagnosis not present

## 2018-01-24 DIAGNOSIS — E1165 Type 2 diabetes mellitus with hyperglycemia: Secondary | ICD-10-CM

## 2018-01-24 DIAGNOSIS — Z833 Family history of diabetes mellitus: Secondary | ICD-10-CM | POA: Diagnosis not present

## 2018-01-24 DIAGNOSIS — F329 Major depressive disorder, single episode, unspecified: Secondary | ICD-10-CM | POA: Diagnosis not present

## 2018-01-24 DIAGNOSIS — E785 Hyperlipidemia, unspecified: Secondary | ICD-10-CM | POA: Diagnosis not present

## 2018-01-24 DIAGNOSIS — K219 Gastro-esophageal reflux disease without esophagitis: Secondary | ICD-10-CM | POA: Diagnosis present

## 2018-01-24 DIAGNOSIS — E1149 Type 2 diabetes mellitus with other diabetic neurological complication: Secondary | ICD-10-CM

## 2018-01-24 DIAGNOSIS — Z794 Long term (current) use of insulin: Secondary | ICD-10-CM

## 2018-01-24 DIAGNOSIS — G4700 Insomnia, unspecified: Secondary | ICD-10-CM | POA: Diagnosis present

## 2018-01-24 DIAGNOSIS — Z79899 Other long term (current) drug therapy: Secondary | ICD-10-CM

## 2018-01-24 DIAGNOSIS — Z8249 Family history of ischemic heart disease and other diseases of the circulatory system: Secondary | ICD-10-CM

## 2018-01-24 DIAGNOSIS — R000 Tachycardia, unspecified: Secondary | ICD-10-CM | POA: Diagnosis not present

## 2018-01-24 DIAGNOSIS — R05 Cough: Secondary | ICD-10-CM | POA: Diagnosis not present

## 2018-01-24 DIAGNOSIS — G8929 Other chronic pain: Secondary | ICD-10-CM | POA: Diagnosis present

## 2018-01-24 LAB — CBC WITH DIFFERENTIAL/PLATELET
Abs Immature Granulocytes: 0.06 10*3/uL (ref 0.00–0.07)
Basophils Absolute: 0 10*3/uL (ref 0.0–0.1)
Basophils Relative: 0 %
EOS ABS: 0 10*3/uL (ref 0.0–0.5)
Eosinophils Relative: 0 %
HCT: 31.3 % — ABNORMAL LOW (ref 36.0–46.0)
Hemoglobin: 9.2 g/dL — ABNORMAL LOW (ref 12.0–15.0)
Immature Granulocytes: 1 %
Lymphocytes Relative: 24 %
Lymphs Abs: 2.6 10*3/uL (ref 0.7–4.0)
MCH: 24.7 pg — ABNORMAL LOW (ref 26.0–34.0)
MCHC: 29.4 g/dL — ABNORMAL LOW (ref 30.0–36.0)
MCV: 84.1 fL (ref 80.0–100.0)
Monocytes Absolute: 0.9 10*3/uL (ref 0.1–1.0)
Monocytes Relative: 9 %
Neutro Abs: 7.2 10*3/uL (ref 1.7–7.7)
Neutrophils Relative %: 66 %
Platelets: 467 10*3/uL — ABNORMAL HIGH (ref 150–400)
RBC: 3.72 MIL/uL — AB (ref 3.87–5.11)
RDW: 14.7 % (ref 11.5–15.5)
WBC: 10.8 10*3/uL — ABNORMAL HIGH (ref 4.0–10.5)
nRBC: 0.2 % (ref 0.0–0.2)

## 2018-01-24 LAB — URINALYSIS, MICROSCOPIC (REFLEX)

## 2018-01-24 LAB — COMPREHENSIVE METABOLIC PANEL
ALT: 31 U/L (ref 0–44)
AST: 26 U/L (ref 15–41)
Albumin: 3.4 g/dL — ABNORMAL LOW (ref 3.5–5.0)
Alkaline Phosphatase: 96 U/L (ref 38–126)
Anion gap: 9 (ref 5–15)
BUN: 21 mg/dL (ref 8–23)
CO2: 24 mmol/L (ref 22–32)
Calcium: 9.1 mg/dL (ref 8.9–10.3)
Chloride: 103 mmol/L (ref 98–111)
Creatinine, Ser: 1.25 mg/dL — ABNORMAL HIGH (ref 0.44–1.00)
GFR calc Af Amer: 48 mL/min — ABNORMAL LOW (ref >60)
GFR calc non Af Amer: 41 mL/min — ABNORMAL LOW (ref >60)
Glucose, Bld: 285 mg/dL — ABNORMAL HIGH (ref 70–99)
Potassium: 3.7 mmol/L (ref 3.5–5.1)
Sodium: 136 mmol/L (ref 135–145)
Total Bilirubin: 0.4 mg/dL (ref 0.3–1.2)
Total Protein: 7.6 g/dL (ref 6.5–8.1)

## 2018-01-24 LAB — INFLUENZA PANEL BY PCR (TYPE A & B)
INFLAPCR: NEGATIVE
Influenza B By PCR: NEGATIVE

## 2018-01-24 LAB — URINALYSIS, ROUTINE W REFLEX MICROSCOPIC
Bilirubin Urine: NEGATIVE
Glucose, UA: 250 mg/dL — AB
Ketones, ur: NEGATIVE mg/dL
Nitrite: POSITIVE — AB
Protein, ur: >300 mg/dL — AB
Specific Gravity, Urine: 1.025 (ref 1.005–1.030)
pH: 5.5 (ref 5.0–8.0)

## 2018-01-24 LAB — BRAIN NATRIURETIC PEPTIDE: B Natriuretic Peptide: 63 pg/mL (ref 0.0–100.0)

## 2018-01-24 LAB — TROPONIN I: Troponin I: <0.03 ng/mL (ref <0.03)

## 2018-01-24 MED ORDER — SODIUM CHLORIDE 0.9 % IV SOLN
1.0000 g | Freq: Once | INTRAVENOUS | Status: AC
  Administered 2018-01-24: 1 g via INTRAVENOUS
  Filled 2018-01-24: qty 10

## 2018-01-24 MED ORDER — IOPAMIDOL (ISOVUE-370) INJECTION 76%
100.0000 mL | Freq: Once | INTRAVENOUS | Status: AC | PRN
  Administered 2018-01-24: 100 mL via INTRAVENOUS

## 2018-01-24 MED ORDER — SODIUM CHLORIDE 0.9 % IV BOLUS
500.0000 mL | Freq: Once | INTRAVENOUS | Status: AC
  Administered 2018-01-24: 500 mL via INTRAVENOUS

## 2018-01-24 NOTE — H&P (Signed)
TRH H&P    Patient Demographics:    [name], is a 77 y.o. female  MRN: [id number]  DOB - May 17, 1940  Admit Date - 01/24/2018  Referring MD/NP/PA: Dr. Laverta Baltimore  Outpatient Primary MD for the patient is [name], MD  Patient coming from: Home  Chief complaint-fever, body aches   HPI:    [name]  is a 77 y.o. female, with history of diabetes mellitus type 2, pulmonary embolism on chronic anticoagulation with Pradaxa since 2012, dyslipidemia, hypertension, GERD came to hospital with complaints of cough, runny nose, fever, generalized body aches.  Patient has had repeated UTIs over the past few months.  Last antibiotics 2 weeks ago at that time she was prescribed ciprofloxacin.  Patient does complain of dysuria denies abdominal or flank pain.  Complains of urgency Denies shortness of breath Complains of chest and abdominal pain with coughing. In the ED CTA chest was done which was negative for pulmonary embolism. UA was abnormal, patient was started on IV ceftriaxone, urine culture obtained. Denies previous history of stroke or seizures. No history of CAD     Review of systems:    In addition to the HPI above,    All other systems reviewed and are negative.    Past History of the following :    Past Medical History:  Diagnosis Date  . Anemia, iron deficiency 2012   Evaluated by Dr. Oneida Alar; H&H of 9.3/30.8 with Conrad in 10/2010; 3/3 positive Hemoccult cards in 07/2011  . Anxiety    was on Prozac for a couple of weeks  . Atrial fibrillation ([place])    takes Coumadin daily  . Bruises easily    takes Coumadin  . Chronic anticoagulation 07/21/2010  . Chronic back pain    related to knee pain  . Degenerative joint disease    Knees  . Depression   . Diabetes mellitus    takes Metformin daily  . Gastroesophageal reflux disease    takes Omeprazole daily  . Glaucoma   . Glaucoma    uses eye drops at night  . History of blood transfusion 1969  . History of gout    was on medication but taken off of several months ago  . Hx of colonic polyps   . Hx of migraines    last one about a yr ago;takes Topamax daily  . Hyperlipidemia    takes Pravastatin daily  . Hypertension    takes Hyzaar daily and Propranolol   . Insomnia    takes Restoril prn  . Joint pain   . Joint swelling   . Memory loss    takes donepezil  . Migraine   . Migraine   . Nocturia   . Overweight(278.02)   . Pneumonia 1969   hx of  . Pulmonary embolism [facility]) May 2012   acute presentation, bilateral PE  . Skin spots-aging   . Urinary frequency       Past Surgical History:  Procedure Laterality Date  . CATARACT EXTRACTION W/PHACO Right 04/11/2012   Procedure:  CATARACT EXTRACTION PHACO AND INTRAOCULAR LENS PLACEMENT (IOC);  Surgeon: Elta Guadeloupe T. Gershon Crane, MD;  Location: [facility];  Service: Ophthalmology;  Laterality: Right;  CDE=9.61  . CATARACT EXTRACTION W/PHACO Left 12/26/2012   Procedure: CATARACT EXTRACTION PHACO AND INTRAOCULAR LENS PLACEMENT (IOC);  Surgeon: Elta Guadeloupe T. Gershon Crane, MD;  Location: [facility];  Service: Ophthalmology;  Laterality: Left;  CDE:7.74  . COLONOSCOPY  Jan 2002; 2012   2002: Dr. Tamala Julian, ext. hemorrhoids, nl colon; 2012-adenomatous polyps, gastritis on EGD  . DILATION AND CURETTAGE OF UTERUS    . ESOPHAGOGASTRODUODENOSCOPY  08/24/2011   ESOPHAGOGASTRODUODENOSCOPY; esophageal dilatation; Rogene Houston, MD;  . Freda Munro CAPSULE STUDY  08/18/2011   Procedure: GIVENS CAPSULE STUDY;  Surgeon: Rogene Houston, MD;  Location: [facility];  Service: Endoscopy;  Laterality: N/A;  730  . KNEE ARTHROSCOPY W/ MENISCECTOMY  90's   Left  . ORIF ANKLE FRACTURE Right 90's  . TONSILLECTOMY    . TOTAL KNEE ARTHROPLASTY  10/20/2011   Procedure: TOTAL KNEE ARTHROPLASTY;  Surgeon: Ninetta Lights, MD;  Location: [place];  Service: Orthopedics;  Laterality: Left;  . UPPER GASTROINTESTINAL ENDOSCOPY    .  URETHRAL DILATION        Social History:      Social History   Tobacco Use  . Smoking status: Never Smoker  . Smokeless tobacco: Never Used  Substance Use Topics  . Alcohol use: No       Family History :     Family History  Problem Relation Age of Onset  . Diabetes Mother   . Hypertension Mother   . Arthritis Mother   . Heart failure Mother   . Migraines Mother   . Kidney failure Mother   . Leukemia Father   . Migraines Father   . Kidney failure Father   . Diabetes Sister   . Hypertension Sister   . Diabetes Brother   . Hypertension Brother   . Hypertension Brother   . Hypertension Brother   . Hypertension Brother   . Diabetes Brother   . Diabetes Brother   . Diabetes Brother   . Pulmonary embolism Sister   . Migraines Sister   . Kidney failure Brother   . Colon cancer Neg Hx   . Colon polyps Neg Hx       Home Medications:   Prior to Admission medications   Medication Sig Start Date End Date Taking? Authorizing Provider  acetaminophen (TYLENOL) 500 MG tablet Take 500 mg by mouth every 6 (six) hours as needed for mild pain.   Yes [provider]  acyclovir (ZOVIRAX) 400 MG tablet TAKE 1 TABLET (400 MG TOTAL) BY MOUTH 3 (THREE) TIMES DAILY. 09/12/17  Yes [name], MD  amLODipine (NORVASC) 10 MG tablet TAKE 1 TABLET BY MOUTH EVERY DAY Patient taking differently: Take 10 mg by mouth daily.  07/12/17  Yes [name], MD  atorvastatin (LIPITOR) 40 MG tablet TAKE 1 TABLET BY MOUTH EVERY DAY Patient taking differently: Take 40 mg by mouth daily.  09/26/17  Yes [name], MD  busPIRone (BUSPAR) 7.5 MG tablet TAKE 1 TABLET BY MOUTH 3 TIMES DAILY Patient taking differently: Take 7.5 mg by mouth 3 (three) times daily.  12/23/17  Yes [name], MD  cloNIDine (CATAPRES) 0.2 MG tablet TAKE 1 TABLET (0.2 MG TOTAL) BY MOUTH 3 (THREE) TIMES DAILY. 10/04/17  Yes [name], MD  colchicine 0.6 MG tablet Take 1 tablet (0.6  mg total) by  mouth daily. May decrease to once per day as needed for diarrhea 06/27/17  Yes James, Mark, MD  COMBIGAN 0.2-0.5 % ophthalmic solution Place 1 drop into both eyes 2 (two) times daily. 03/23/17  Yes [provider]  conjugated estrogens (PREMARIN) vaginal cream Apply sparingly ( fingertip)  Twice weekly to vaginal area Patient taking differently: Place vaginally 2 (two) times a week. Apply sparingly ( fingertip)  Twice weekly to vaginal area on Wednesdays and Fridays 08/10/17  Yes Simpson, Margaret E, MD  dabigatran (PRADAXA) 150 MG CAPS capsule TAKE ONE CAPSULE BY MOUTH EVERY 12 HOURS Patient taking differently: Take 150 mg by mouth every 12 (twelve) hours.  12/05/17  Yes Branch, Jonathan F, MD  donepezil (ARICEPT) 10 MG tablet TAKE 1 TABLET BY MOUTH EVERYDAY AT BEDTIME Patient taking differently: Take 10 mg by mouth at bedtime.  11/10/17  Yes Simpson, Margaret E, MD  Fluoxetine HCl, PMDD, 10 MG TABS One tablet once daily Patient taking differently: Take 10 mg by mouth daily.  08/03/17 08/04/18 Yes Simpson, Margaret E, MD  guaiFENesin (MUCINEX) 600 MG 12 hr tablet Take 600 mg by mouth 2 (two) times daily as needed for cough or to loosen phlegm.   Yes [provider]  hydrOXYzine (ATARAX/VISTARIL) 25 MG tablet TAKE 1 TABLET BY MOUTH THREE TIMES A DAY AS NEEDED Patient taking differently: Take 25 mg by mouth 3 (three) times daily as needed for itching.  09/02/17  Yes Simpson, Margaret E, MD  imipramine (TOFRANIL) 50 MG tablet TAKE 2 TABLETS (100 MG TOTAL) BY MOUTH AT BEDTIME. 07/12/17  Yes Simpson, Margaret E, MD  Insulin Glargine (LANTUS SOLOSTAR) 100 UNIT/ML Solostar Pen Inject 40 units under the skin twice daily Patient taking differently: Inject 40 Units into the skin 2 (two) times daily.  11/04/17  Yes Simpson, Margaret E, MD  montelukast (SINGULAIR) 10 MG tablet TAKE 1 TABLET BY MOUTH AT BEDTIME Patient taking differently: Take 10 mg by mouth at bedtime.  08/23/17  Yes  Simpson, Margaret E, MD  nystatin (MYCOSTATIN/NYSTOP) powder Apply twice daily to rash under breast for 1 wekk, then , as needed Patient taking differently: Apply topically daily as needed (for rash under breasts).  12/21/17  Yes Simpson, Margaret E, MD  olopatadine (PATANOL) 0.1 % ophthalmic solution INSTILL 1 DROP INTO BOTH EYES TWICE A DAY Patient taking differently: Place 1 drop into both eyes 2 (two) times daily.  06/14/17  Yes Simpson, Margaret E, MD  omeprazole (PRILOSEC) 40 MG capsule TAKE 1 CAPSULE BY MOUTH TWICE A DAY Patient taking differently: Take 40 mg by mouth 2 (two) times daily.  11/02/17  Yes Simpson, Margaret E, MD  oxyCODONE-acetaminophen (PERCOCET) 10-325 MG tablet Take one tablet two times daily for back pain Patient taking differently: Take 1 tablet by mouth 2 (two) times daily. for back pain 12/29/17 01/28/18 Yes Simpson, Margaret E, MD  phenazopyridine (AZO-STANDARD) 95 MG tablet Take 95 mg by mouth 3 (three) times daily as needed for pain.   Yes [provider]  SUMAtriptan (IMITREX) 5 MG/ACT nasal spray Place 1 spray into the nose every 2 (two) hours as needed for migraine.  01/16/17  Yes [provider]  temazepam (RESTORIL) 30 MG capsule Take 1 capsule (30 mg total) by mouth at bedtime as needed for sleep. 12/21/17  Yes Simpson, Margaret E, MD  topiramate (TOPAMAX) 100 MG tablet TAKE 1 TABLET BY MOUTH TWICE A DAY Patient taking differently: Take 100 mg by mouth 2 (two) times daily.    09/12/17  Yes [name], MD  VENTOLIN HFA 108 (90 Base) MCG/ACT inhaler INHALE 2 PUFFS INTO THE LUNGS EVERY 6 (SIX) HOURS AS NEEDED FOR WHEEZING OR SHORTNESS OF BREATH. 05/26/16  Yes [name], MD  Vitamin D, Ergocalciferol, (DRISDOL) 50000 units CAPS capsule TAKE 1 CAPSULE (50,000 UNITS TOTAL) BY MOUTH ONCE A WEEK. Patient taking differently: Take 50,000 Units by mouth every 7 (seven) days.  11/18/17  Yes [name], MD  B-D UF III MINI PEN NEEDLES 31G  X 5 MM MISC USE WITH LANTUS AS DIRECTED 08/08/17   [name], MD  Blood Glucose Monitoring Suppl (ACCU-CHEK AVIVA PLUS) w/Device KIT For once daily testing dx E11.9 11/24/15   [name], MD  ciprofloxacin (CIPRO) 500 MG tablet Take 1 tablet (500 mg total) by mouth 2 (two) times daily. Patient not taking: Reported on 01/24/2018 12/21/17   [name], MD  clotrimazole-betamethasone (LOTRISONE) cream Apply twice daily to rash under breast as needed for 1 week , then if recurrs Patient not taking: Reported on 01/24/2018 12/21/17   [name], MD  glucose blood (ONE TOUCH ULTRA TEST) test strip USE TO TEST TWICE DAILY 12/26/17   [name], MD  [facility] DELICA LANCETS 16X MISC USE TO TEST ONCE DAILY 10/14/17   [name], MD  oxyCODONE-acetaminophen [facility]) 10-325 MG tablet Take one tablet two times daily for back pain 01/28/18 02/27/18  [name], MD  oxyCODONE-acetaminophen (PERCOCET) 10-325 MG tablet Take one tablet two times daily for back pain 02/27/18 03/29/18  [name], MD     Allergies:        Physical Exam:   Vitals  Blood pressure (!) 158/110, pulse (!) 115, temperature 98.5 F (36.9 C), temperature source Oral, resp. rate 20, height 5' 7" (1.702 m), weight 106 kg, SpO2 95 %.  1.  General: Appears in no acute distress  2. Psychiatric:  Intact judgement and  insight, awake alert, oriented x 3.  3. Neurologic: No focal neurological deficits, all cranial nerves intact.Strength 5/5 all 4 extremities, sensation intact all 4 extremities, plantars down going.  4. Eyes :  anicteric sclerae, moist conjunctivae with no lid lag. PERRLA.  5. ENMT:  Oropharynx clear with moist mucous membranes and good dentition  6. Neck:  supple, no cervical lymphadenopathy appriciated, No thyromegaly  7. Respiratory : Normal respiratory effort, good air movement bilaterally,clear to  auscultation bilaterally  8.  Cardiovascular : RRR, no gallops, rubs or murmurs, no leg edema  9. Gastrointestinal:  Positive bowel sounds, abdomen soft, non-tender to palpation,no hepatosplenomegaly, no rigidity or guarding       10. Skin:  No cyanosis, normal texture and turgor, no rash, lesions or ulcers  11.Musculoskeletal:  Good muscle tone,  joints appear normal , no effusions,  normal range of motion    Data Review:    CBC Recent Labs  Lab 01/24/18 2035  WBC 10.8*  HGB 9.2*  HCT 31.3*  PLT 467*  MCV 84.1  MCH 24.7*  MCHC 29.4*  RDW 14.7  LYMPHSABS 2.6  MONOABS 0.9  EOSABS 0.0  BASOSABS 0.0   ------------------------------------------------------------------------------------------------------------------  Results for orders placed or performed during the hospital encounter of 01/24/18 (from the past 48 hour(s))  Comprehensive metabolic panel     Status: Abnormal   Collection Time: 01/24/18  8:35 PM  Result Value Ref Range   Sodium 136 135 - 145 mmol/L   Potassium 3.7 3.5 - 5.1  mmol/L   Chloride 103 98 - 111 mmol/L   CO2 24 22 - 32 mmol/L   Glucose, Bld 285 (H) 70 - 99 mg/dL   BUN 21 8 - 23 mg/dL   Creatinine, Ser 1.25 (H) 0.44 - 1.00 mg/dL   Calcium 9.1 8.9 - 10.3 mg/dL   Total Protein 7.6 6.5 - 8.1 g/dL   Albumin 3.4 (L) 3.5 - 5.0 g/dL   AST 26 15 - 41 U/L   ALT 31 0 - 44 U/L   Alkaline Phosphatase 96 38 - 126 U/L   Total Bilirubin 0.4 0.3 - 1.2 mg/dL   GFR calc non Af Amer 41 (L) >60 mL/min   GFR calc Af Amer 48 (L) >60 mL/min   Anion gap 9 5 - 15    Comment: Performed at [facility], [address] [place], [place] [address]  Brain natriuretic peptide     Status: None   Collection Time: 01/24/18  8:35 PM  Result Value Ref Range   B Natriuretic Peptide 63.0 0.0 - 100.0 pg/mL    Comment: Performed at [facility], [address] [place], [place] [address]  Troponin I - Once     Status: None   Collection Time: 01/24/18  8:35 PM  Result Value Ref Range   Troponin I <0.03  <0.03 ng/mL    Comment: Performed at [facility], [address] [place], [place] [address]  CBC with Differential     Status: Abnormal   Collection Time: 01/24/18  8:35 PM  Result Value Ref Range   WBC 10.8 (H) 4.0 - 10.5 K/uL   RBC 3.72 (L) 3.87 - 5.11 MIL/uL   Hemoglobin 9.2 (L) 12.0 - 15.0 g/dL   HCT 31.3 (L) 36.0 - 46.0 %   MCV 84.1 80.0 - 100.0 fL   MCH 24.7 (L) 26.0 - 34.0 pg   MCHC 29.4 (L) 30.0 - 36.0 g/dL   RDW 14.7 11.5 - 15.5 %   Platelets 467 (H) 150 - 400 K/uL   nRBC 0.2 0.0 - 0.2 %   Neutrophils Relative % 66 %   Neutro Abs 7.2 1.7 - 7.7 K/uL   Lymphocytes Relative 24 %   Lymphs Abs 2.6 0.7 - 4.0 K/uL   Monocytes Relative 9 %   Monocytes Absolute 0.9 0.1 - 1.0 K/uL   Eosinophils Relative 0 %   Eosinophils Absolute 0.0 0.0 - 0.5 K/uL   Basophils Relative 0 %   Basophils Absolute 0.0 0.0 - 0.1 K/uL   Immature Granulocytes 1 %   Abs Immature Granulocytes 0.06 0.00 - 0.07 K/uL    Comment: Performed at [facility], [address] [place], [place] [address]  Influenza panel by PCR (type A & B)     Status: None   Collection Time: 01/24/18  8:36 PM  Result Value Ref Range   Influenza A By PCR NEGATIVE NEGATIVE   Influenza B By PCR NEGATIVE NEGATIVE    Comment: (NOTE) The Xpert Xpress Flu assay is intended as an aid in the diagnosis of  influenza and should not be used as a sole basis for treatment.  This  assay is FDA approved for nasopharyngeal swab specimens only. Nasal  washings and aspirates are unacceptable for Xpert Xpress Flu testing. Performed at [facility], [address] , [place] [address]   Urinalysis, Routine w reflex microscopic     Status: Abnormal   Collection Time: 01/24/18  8:37 PM  Result Value Ref Range     Color, Urine ORANGE (A) YELLOW    Comment: BIOCHEMICALS MAY BE AFFECTED BY COLOR   APPearance CLOUDY (A) CLEAR   Specific Gravity, Urine 1.025 1.005 - 1.030   pH 5.5 5.0 - 8.0   Glucose, UA 250 (A) NEGATIVE mg/dL   Hgb urine  dipstick MODERATE (A) NEGATIVE   Bilirubin Urine NEGATIVE NEGATIVE   Ketones, ur NEGATIVE NEGATIVE mg/dL   Protein, ur >300 (A) NEGATIVE mg/dL   Nitrite POSITIVE (A) NEGATIVE   Leukocytes, UA SMALL (A) NEGATIVE    Comment: Performed at [facility], [address] [place], [place] [address]  Urinalysis, Microscopic (reflex)     Status: Abnormal   Collection Time: 01/24/18  8:37 PM  Result Value Ref Range   RBC / HPF 11-20 0 - 5 RBC/hpf   WBC, UA 6-10 0 - 5 WBC/hpf   Bacteria, UA FEW (A) NONE SEEN   Squamous Epithelial / LPF 11-20 0 - 5   Non Squamous Epithelial PRESENT (A) NONE SEEN   Budding Yeast PRESENT     Comment: Performed at [facility], [address] [place], [place] [address]    Chemistries  Recent Labs  Lab 01/24/18 2035  NA 136  K 3.7  CL 103  CO2 24  GLUCOSE 285*  BUN 21  CREATININE 1.25*  CALCIUM 9.1  AST 26  ALT 31  ALKPHOS 96  BILITOT 0.4   ------------------------------------------------------------------------------------------------------------------  ------------------------------------------------------------------------------------------------------------------ GFR: Estimated Creatinine Clearance: 46.5 mL/min (A) (by C-G formula based on SCr of 1.25 mg/dL (H)). Liver Function Tests: Recent Labs  Lab 01/24/18 2035  AST 26  ALT 31  ALKPHOS 96  BILITOT 0.4  PROT 7.6  ALBUMIN 3.4*   No results for input(s): LIPASE, AMYLASE in the last 168 hours. No results for input(s): AMMONIA in the last 168 hours. Coagulation Profile: No results for input(s): INR, PROTIME in the last 168 hours. Cardiac Enzymes: Recent Labs  Lab 01/24/18 2035  TROPONINI <0.03     --------------------------------------------------------------------------------------------------------------- Urine analysis:    Component Value Date/Time   COLORURINE ORANGE (A) 01/24/2018 2037   APPEARANCEUR CLOUDY (A) 01/24/2018 2037   LABSPEC 1.025 01/24/2018 2037   PHURINE 5.5  01/24/2018 2037   GLUCOSEU 250 (A) 01/24/2018 2037   HGBUR MODERATE (A) 01/24/2018 2037   HGBUR negative 01/06/2010 0929   BILIRUBINUR NEGATIVE 01/24/2018 2037   BILIRUBINUR moderate 11/17/2016 1438   KETONESUR NEGATIVE 01/24/2018 2037   PROTEINUR >300 (A) 01/24/2018 2037   UROBILINOGEN >=8.0 (A) 11/17/2016 1438   UROBILINOGEN 0.2 10/12/2011 1556   NITRITE POSITIVE (A) 01/24/2018 2037   LEUKOCYTESUR SMALL (A) 01/24/2018 2037      Imaging Results:    Dg Chest 2 View  Result Date: 01/24/2018 CLINICAL DATA:  77-year-old female with cough and fever. EXAM: CHEST - 2 VIEW COMPARISON:  Chest radiograph dated 06/20/2017 FINDINGS: The heart size and mediastinal contours are within normal limits. Both lungs are clear. The visualized skeletal structures are unremarkable. IMPRESSION: No active cardiopulmonary disease. Electronically Signed   By: Arash  Radparvar M.D.   On: 01/24/2018 21:21   Ct Angio Chest Pe W And/or Wo Contrast  Result Date: 01/24/2018 CLINICAL DATA:  Cough, chest congestion and myalgias for the past week. Chest pain with inspiration. EXAM: CT ANGIOGRAPHY CHEST WITH CONTRAST TECHNIQUE: Multidetector CT imaging of the chest was performed using the standard protocol during bolus administration of intravenous contrast. Multiplanar CT image reconstructions and MIPs were obtained to evaluate the vascular anatomy. CONTRAST:  100mL ISOVUE-370 IOPAMIDOL (ISOVUE-370)   INJECTION 76% COMPARISON:  Chest radiographs obtained earlier today. Chest CTA dated 07/16/2010 FINDINGS: Cardiovascular: Normally opacified pulmonary arteries with no pulmonary arterial filling defects seen. Mildly enlarged heart. Mediastinum/Nodes: No enlarged mediastinal, hilar, or axillary lymph nodes. Thyroid gland, trachea, and esophagus demonstrate no significant findings. Lungs/Pleura: Lungs are clear. No pleural effusion or pneumothorax. Upper Abdomen: Unremarkable. Musculoskeletal: Thoracic spine degenerative changes.  Review of the MIP images confirms the above findings. IMPRESSION: No pulmonary emboli or acute abnormality. Electronically Signed   By: Claudie Revering M.D.   On: 01/24/2018 22:54    My personal review of EKG: Rhythm NSR   Assessment & Plan:    Active Problems:   UTI (urinary tract infection)   1. UTI-patient has a normal UA, complains of dysuria.  Will start ceftriaxone.  Follow urine culture results.  2. Acute bronchitis-influenza panel is negative, will start duonebs every 6 hours, Mucinex 1 tablet p.o. twice daily.  3. History of pulmonary embolism-patient had unprovoked PE in 2012, she was started on Pradaxa and has been maintained on that since then.    4. Diabetes mellitus type 2-continue Lantus 40 units subcu twice a day, will initiate sliding scale insulin with NovoLog.  5. Hypertension-blood pressure stable, continue home medications.  6. GERD-continue Protonix  7. Acute kidney injury-mild AKI, creatinine 1.25.  Will start normal saline at 100 mL/h.  Follow BMP in a.m.   DVT Prophylaxis-   Pradaxa  AM Labs Ordered, also please review Full Orders  Family Communication: Admission, patients condition and plan of care including tests being ordered have been discussed with the patient and niece and sister at bedside who indicate understanding and agree with the plan and Code Status.  Code Status: DNR  Admission status: Observation Based on patients clinical presentation and evaluation of above clinical data, I have made determination that patient meets observation criteria and will require less than 2 midnights in the hospital   Time spent in minutes : 60 minutes   Oswald Hillock M.D on 01/24/2018 at 11:56 PM  Between 7am to 7pm - Pager - [phone]. After 7pm go to [email or web address] - password [facility]   [facility] - Office  [phone]

## 2018-01-24 NOTE — ED Notes (Signed)
Patient transported to CT 

## 2018-01-24 NOTE — ED Triage Notes (Signed)
Patient states cough congestion and body aches x 1 week. Patient states chest pain upon inspiration. Patient had Tylenol and Mucinex around 5:30 PM today.

## 2018-01-24 NOTE — ED Provider Notes (Signed)
Emergency Department Provider Note   I have reviewed the triage vital signs and the nursing notes.   HISTORY  Chief Complaint Fever (body aches)   HPI [name] is a 77 y.o. female with PMH of anemia, A-fib, DM, HTN, and prior PE on Pradaxa presents to the ED with subjective fever, chills, body aches, SOB, and left sided chest discomfort worsening over the last 24 hours. Family state that patient has had upper respiratory tract infection symptoms over the past week but symptoms improved 2 days ago.  The patient seemed to become acutely worse in the last 24 hours with subjective fever and body aches.  She is been having cough, congestion, sore throat, headache.  Patient also experiencing some left-sided chest discomfort which is worse with breathing.  States this does not feel like her prior PE and states that she has been compliant with her Pradaxa.  Patient also with history of frequent urinary tract infection last treated with antibiotics 2 weeks ago.  She has also noticed some increased dysuria, hesitancy, urgency over the past several days.  Denies any back or flank pain.  No abdominal pain, vomiting, diarrhea.  No radiation of symptoms or other modifying factors.  Past Medical History:  Diagnosis Date  . Anemia, iron deficiency 2012   Evaluated by Dr. Oneida Alar; H&H of 9.3/30.8 with [place] in 10/2010; 3/3 positive Hemoccult cards in 07/2011  . Anxiety    was on Prozac for a couple of weeks  . Atrial fibrillation ([place])    takes Coumadin daily  . Bruises easily    takes Coumadin  . Chronic anticoagulation 07/21/2010  . Chronic back pain    related to knee pain  . Degenerative joint disease    Knees  . Depression   . Diabetes mellitus    takes Metformin daily  . Gastroesophageal reflux disease    takes Omeprazole daily  . Glaucoma   . Glaucoma    uses eye drops at night  . History of blood transfusion 1969  . History of gout    was on medication but taken off of several  months ago  . Hx of colonic polyps   . Hx of migraines    last one about a yr ago;takes Topamax daily  . Hyperlipidemia    takes Pravastatin daily  . Hypertension    takes Hyzaar daily and Propranolol   . Insomnia    takes Restoril prn  . Joint pain   . Joint swelling   . Memory loss    takes donepezil  . Migraine   . Migraine   . Nocturia   . Overweight(278.02)   . Pneumonia 1969   hx of  . Pulmonary embolism [facility]) May 2012   acute presentation, bilateral PE  . Skin spots-aging   . Urinary frequency     Patient Active Problem List   Diagnosis Date Noted  . UTI (urinary tract infection) 01/24/2018  . Prolonged Q-T interval on ECG 08/14/2016  . 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 Encounter for chronic pain management 07/17/2016  . Chronic pain syndrome 02/03/2016  . Candidiasis of breast 10/29/2015  . Bilateral knee pain 04/23/2014  . Diabetes mellitus, insulin dependent (IDDM), uncontrolled ([place]) 11/15/2013  . Right-sided low back pain with right-sided sciatica 07/02/2013  . Seasonal allergies 04/11/2013  . Urinary incontinence 04/11/2013  . Metabolic syndrome X 57/97/2820  . Chronic anticoagulation 12/22/2012  . Vitamin D deficiency 11/05/2012  . Memory loss 11/01/2012  . Depression with anxiety  11/01/2012  . Anemia, iron deficiency   . Pruritus 09/03/2009  . Morbid obesity ([place]) 08/16/2008  . Dyslipidemia 05/30/2007  . Migraine 05/30/2007  . Essential hypertension 05/30/2007  . Gastroesophageal reflux disease 05/30/2007    Past Surgical History:  Procedure Laterality Date  . CATARACT EXTRACTION W/PHACO Right 04/11/2012   Procedure: CATARACT EXTRACTION PHACO AND INTRAOCULAR LENS PLACEMENT (IOC);  Surgeon: Elta Guadeloupe T. Gershon Crane, MD;  Location: [facility]S;  Service: Ophthalmology;  Laterality: Right;  CDE=9.61  . CATARACT EXTRACTION W/PHACO Left 12/26/2012   Procedure: CATARACT EXTRACTION PHACO AND INTRAOCULAR LENS PLACEMENT (IOC);  Surgeon: Elta Guadeloupe T. Gershon Crane, MD;  Location: [facility]  ORS;  Service: Ophthalmology;  Laterality: Left;  CDE:7.74  . COLONOSCOPY  Jan 2002; 2012   2002: Dr. Tamala Julian, ext. hemorrhoids, nl colon; 2012-adenomatous polyps, gastritis on EGD  . DILATION AND CURETTAGE OF UTERUS    . ESOPHAGOGASTRODUODENOSCOPY  08/24/2011   ESOPHAGOGASTRODUODENOSCOPY; esophageal dilatation; Rogene Houston, MD;  . Freda Munro CAPSULE STUDY  08/18/2011   Procedure: GIVENS CAPSULE STUDY;  Surgeon: Rogene Houston, MD;  Location: [facility] SUITE;  Service: Endoscopy;  Laterality: N/A;  730  . KNEE ARTHROSCOPY W/ MENISCECTOMY  90's   Left  . ORIF ANKLE FRACTURE Right 90's  . TONSILLECTOMY    . TOTAL KNEE ARTHROPLASTY  10/20/2011   Procedure: TOTAL KNEE ARTHROPLASTY;  Surgeon: Ninetta Lights, MD;  Location: [place];  Service: Orthopedics;  Laterality: Left;  . UPPER GASTROINTESTINAL ENDOSCOPY    . URETHRAL DILATION      Allergies Penicillins; Fentanyl; Sulfonamide derivatives; and Codeine  Family History  Problem Relation Age of Onset  . Diabetes Mother   . Hypertension Mother   . Arthritis Mother   . Heart failure Mother   . Migraines Mother   . Kidney failure Mother   . Leukemia Father   . Migraines Father   . Kidney failure Father   . Diabetes Sister   . Hypertension Sister   . Diabetes Brother   . Hypertension Brother   . Hypertension Brother   . Hypertension Brother   . Hypertension Brother   . Diabetes Brother   . Diabetes Brother   . Diabetes Brother   . Pulmonary embolism Sister   . Migraines Sister   . Kidney failure Brother   . Colon cancer Neg Hx   . Colon polyps Neg Hx     Social History Social History   Tobacco Use  . Smoking status: Never Smoker  . Smokeless tobacco: Never Used  Substance Use Topics  . Alcohol use: No  . Drug use: No    Review of Systems  Constitutional: Positive fever, chills, and body aches.  Eyes: No visual changes. ENT: Positive sore throat. Cardiovascular: Positive chest pain. Respiratory: Positive shortness of  breath and cough.  Gastrointestinal: No abdominal pain.  No nausea, no vomiting.  No diarrhea.  No constipation. Genitourinary: Positive for dysuria. Musculoskeletal: Negative for back pain. Positive muscle cramping.  Skin: Negative for rash. Neurological: Negative for focal weakness or numbness. Positive HA.   10-point ROS otherwise negative.  ____________________________________________   PHYSICAL EXAM:  VITAL SIGNS: ED Triage Vitals  Enc Vitals Group     BP 01/24/18 2020 (!) 151/91     Pulse Rate 01/24/18 2020 (!) 115     Resp 01/24/18 2020 (!) 21     Temp 01/24/18 2020 98.5 F (36.9 C)     Temp Source 01/24/18 2020 Oral     SpO2 01/24/18 2020 94 %  Weight 01/24/18 2019 233 lb 11 oz (106 kg)     Height 01/24/18 2019 5\' 7"  (1.702 m)     Pain Score 01/24/18 2020 10   Constitutional: Alert and oriented. Well appearing and in no acute distress. Eyes: Conjunctivae are normal. Head: Atraumatic. Nose: Positive congestion/rhinnorhea. Mouth/Throat: Mucous membranes are moist.   Neck: No stridor.  Cardiovascular: Tachycardia. Good peripheral circulation. Grossly normal heart sounds.   Respiratory: Increased respiratory effort.  No retractions. Lungs CTAB. Gastrointestinal: Soft and nontender. No distention.  Musculoskeletal: No lower extremity tenderness nor edema. No gross deformities of extremities. Neurologic:  Normal speech and language. No gross focal neurologic deficits are appreciated.  Skin:  Skin is warm, dry and intact. No rash noted.  ____________________________________________   LABS (all labs ordered are listed, but only abnormal results are displayed)  Labs Reviewed  COMPREHENSIVE METABOLIC PANEL - Abnormal; Notable for the following components:      Result Value   Glucose, Bld 285 (*)    Creatinine, Ser 1.25 (*)    Albumin 3.4 (*)    GFR calc non Af Amer 41 (*)    GFR calc Af Amer 48 (*)    All other components within normal limits  CBC WITH  DIFFERENTIAL/PLATELET - Abnormal; Notable for the following components:   WBC 10.8 (*)    RBC 3.72 (*)    Hemoglobin 9.2 (*)    HCT 31.3 (*)    MCH 24.7 (*)    MCHC 29.4 (*)    Platelets 467 (*)    All other components within normal limits  URINALYSIS, ROUTINE W REFLEX MICROSCOPIC - Abnormal; Notable for the following components:   Color, Urine ORANGE (*)    APPearance CLOUDY (*)    Glucose, UA 250 (*)    Hgb urine dipstick MODERATE (*)    Protein, ur >300 (*)    Nitrite POSITIVE (*)    Leukocytes, UA SMALL (*)    All other components within normal limits  URINALYSIS, MICROSCOPIC (REFLEX) - Abnormal; Notable for the following components:   Bacteria, UA FEW (*)    Non Squamous Epithelial PRESENT (*)    All other components within normal limits  CBC - Abnormal; Notable for the following components:   WBC 10.6 (*)    RBC 3.48 (*)    Hemoglobin 8.6 (*)    HCT 29.4 (*)    MCH 24.7 (*)    MCHC 29.3 (*)    Platelets 424 (*)    All other components within normal limits  COMPREHENSIVE METABOLIC PANEL - Abnormal; Notable for the following components:   Glucose, Bld 202 (*)    Creatinine, Ser 1.10 (*)    Calcium 8.7 (*)    Albumin 3.2 (*)    GFR calc non Af Amer 48 (*)    GFR calc Af Amer 56 (*)    All other components within normal limits  GLUCOSE, CAPILLARY - Abnormal; Notable for the following components:   Glucose-Capillary 167 (*)    All other components within normal limits  URINE CULTURE  BRAIN NATRIURETIC PEPTIDE  TROPONIN I  INFLUENZA PANEL BY PCR (TYPE A & B)  HEMOGLOBIN A1C   ____________________________________________  EKG   EKG Interpretation  Date/Time:  Tuesday January 24 2018 21:10:19 EST Ventricular Rate:  108 PR Interval:    QRS Duration: 83 QT Interval:  301 QTC Calculation: 404 R Axis:   23 Text Interpretation:  Sinus tachycardia Ventricular premature complex Abnormal R-wave progression, early transition Abnormal  inferior Q waves Borderline T  wave abnormalities Baseline wander in lead(s) V1 Similar to prior. No STEMI.  Confirmed by Nanda Quinton [phone]) on 01/24/2018 9:16:59 PM       ____________________________________________  RADIOLOGY  Dg Chest 2 View  Result Date: 01/24/2018 CLINICAL DATA:  77 year old female with cough and fever. EXAM: CHEST - 2 VIEW COMPARISON:  Chest radiograph dated 06/20/2017 FINDINGS: The heart size and mediastinal contours are within normal limits. Both lungs are clear. The visualized skeletal structures are unremarkable. IMPRESSION: No active cardiopulmonary disease. Electronically Signed   By: Anner Crete M.D.   On: 01/24/2018 21:21   Ct Angio Chest Pe W And/or Wo Contrast  Result Date: 01/24/2018 CLINICAL DATA:  Cough, chest congestion and myalgias for the past week. Chest pain with inspiration. EXAM: CT ANGIOGRAPHY CHEST WITH CONTRAST TECHNIQUE: Multidetector CT imaging of the chest was performed using the standard protocol during bolus administration of intravenous contrast. Multiplanar CT image reconstructions and MIPs were obtained to evaluate the vascular anatomy. CONTRAST:  16mL ISOVUE-370 IOPAMIDOL (ISOVUE-370) INJECTION 76% COMPARISON:  Chest radiographs obtained earlier today. Chest CTA dated 07/16/2010 FINDINGS: Cardiovascular: Normally opacified pulmonary arteries with no pulmonary arterial filling defects seen. Mildly enlarged heart. Mediastinum/Nodes: No enlarged mediastinal, hilar, or axillary lymph nodes. Thyroid gland, trachea, and esophagus demonstrate no significant findings. Lungs/Pleura: Lungs are clear. No pleural effusion or pneumothorax. Upper Abdomen: Unremarkable. Musculoskeletal: Thoracic spine degenerative changes. Review of the MIP images confirms the above findings. IMPRESSION: No pulmonary emboli or acute abnormality. Electronically Signed   By: Claudie Revering M.D.   On: 01/24/2018 22:54    ____________________________________________   PROCEDURES  Procedure(s)  performed:   Procedures  None  ____________________________________________   INITIAL IMPRESSION / ASSESSMENT AND PLAN / ED COURSE  Pertinent labs & imaging results that were available during my care of the patient were reviewed by me and considered in my medical decision making (see chart for details).  Patient presents to the emergency department with flulike symptoms.  Family states 1 week of URI but seemed to improve until yesterday when symptoms acutely worsen with subjective fever and cough.  Patient describing some chest discomfort.  She does have history of PE but has been compliant with her Pradaxa.  Patient does have tachycardia here.  Symptoms appear to be more infectious in etiology.  Will rule out pneumonia and obtain baseline labs.  Patient also with some UTI symptoms which will be evaluated here.  10:00 PM Patient with continued tachycardia despite IVF. Patient with evidence of UTI on UA. She is having UTI symptoms. Will treat with Rocephin. Doubt sepsis. Flu negative. Plan for CT angio chest with pleuritic CP, SOB, and history of PE. Updated patient who is in agreement.   CT negative for PE or other acute process. Patient with history of complicated UTI with E. Coli infection in May 2019 requiring admit. Urine culture sent. Will observe overnight to ensure she is improving with abx.   Discussed patient's case with Hospitalist, Dr. Darrick Meigs to request admission. Patient and family (if present) updated with plan. Care transferred to Hospitalist service.  I reviewed all nursing notes, vitals, pertinent old records, EKGs, labs, imaging (as available).  ____________________________________________  FINAL CLINICAL IMPRESSION(S) / ED DIAGNOSES  Final diagnoses:  Influenza-like illness  Acute cystitis without hematuria     MEDICATIONS GIVEN DURING THIS VISIT:  Medications  acetaminophen (TYLENOL) tablet 500 mg (has no administration in time range)  colchicine tablet 0.6 mg  (has no administration in  time range)  oxyCODONE-acetaminophen (PERCOCET/ROXICET) 5-325 MG per tablet 2 tablet (2 tablets Oral Given 01/25/18 0134)  acyclovir (ZOVIRAX) tablet 400 mg (has no administration in time range)  amLODipine (NORVASC) tablet 10 mg (has no administration in time range)  atorvastatin (LIPITOR) tablet 40 mg (has no administration in time range)  cloNIDine (CATAPRES) tablet 0.2 mg (has no administration in time range)  busPIRone (BUSPAR) tablet 7.5 mg (has no administration in time range)  donepezil (ARICEPT) tablet 10 mg (10 mg Oral Given 01/25/18 0134)  imipramine (TOFRANIL) tablet 100 mg (100 mg Oral Given 01/25/18 0150)  temazepam (RESTORIL) capsule 30 mg (30 mg Oral Given 01/25/18 0133)  insulin glargine (LANTUS) injection 40 Units (40 Units Subcutaneous Given 01/25/18 0150)  dabigatran (PRADAXA) capsule 150 mg (150 mg Oral Given 01/25/18 0134)  topiramate (TOPAMAX) tablet 100 mg (100 mg Oral Given 01/25/18 0134)  guaiFENesin (MUCINEX) 12 hr tablet 600 mg (has no administration in time range)  montelukast (SINGULAIR) tablet 10 mg (10 mg Oral Given 01/25/18 0134)  olopatadine (PATANOL) 0.1 % ophthalmic solution 1 drop (1 drop Both Eyes Given 01/25/18 0150)  0.9 %  sodium chloride infusion ( Intravenous New Bag/Given 01/25/18 0051)  guaiFENesin (MUCINEX) 12 hr tablet 600 mg (600 mg Oral Given 01/25/18 0133)  insulin aspart (novoLOG) injection 0-9 Units (has no administration in time range)  cefTRIAXone (ROCEPHIN) 1 g in sodium chloride 0.9 % 100 mL IVPB (has no administration in time range)  ipratropium-albuterol (DUONEB) 0.5-2.5 (3) MG/3ML nebulizer solution 3 mL (has no administration in time range)  albuterol (PROVENTIL) (2.5 MG/3ML) 0.083% nebulizer solution 2.5 mg (has no administration in time range)  brimonidine (ALPHAGAN) 0.2 % ophthalmic solution 1 drop (has no administration in time range)    And  timolol (TIMOPTIC) 0.5 % ophthalmic solution 1 drop (has no  administration in time range)  sodium chloride 0.9 % bolus 500 mL (0 mLs Intravenous Stopped 01/24/18 2208)  cefTRIAXone (ROCEPHIN) 1 g in sodium chloride 0.9 % 100 mL IVPB (1 g Intravenous New Bag/Given 01/24/18 2236)  iopamidol (ISOVUE-370) 76 % injection 100 mL (100 mLs Intravenous Contrast Given 01/24/18 2213)     Note:  This document was prepared using Dragon voice recognition software and may include unintentional dictation errors.  Nanda Quinton, MD Emergency Medicine    Long, [facility], MD 01/25/18 [phone]

## 2018-01-25 ENCOUNTER — Encounter (HOSPITAL_COMMUNITY): Payer: Self-pay | Admitting: *Deleted

## 2018-01-25 ENCOUNTER — Other Ambulatory Visit: Payer: Self-pay

## 2018-01-25 DIAGNOSIS — Z86711 Personal history of pulmonary embolism: Secondary | ICD-10-CM | POA: Diagnosis present

## 2018-01-25 LAB — COMPREHENSIVE METABOLIC PANEL
ALT: 26 U/L (ref 0–44)
ANION GAP: 9 (ref 5–15)
AST: 21 U/L (ref 15–41)
Albumin: 3.2 g/dL — ABNORMAL LOW (ref 3.5–5.0)
Alkaline Phosphatase: 83 U/L (ref 38–126)
BUN: 19 mg/dL (ref 8–23)
CO2: 23 mmol/L (ref 22–32)
Calcium: 8.7 mg/dL — ABNORMAL LOW (ref 8.9–10.3)
Chloride: 105 mmol/L (ref 98–111)
Creatinine, Ser: 1.1 mg/dL — ABNORMAL HIGH (ref 0.44–1.00)
GFR calc Af Amer: 56 mL/min — ABNORMAL LOW (ref >60)
GFR calc non Af Amer: 48 mL/min — ABNORMAL LOW (ref >60)
Glucose, Bld: 202 mg/dL — ABNORMAL HIGH (ref 70–99)
Potassium: 3.5 mmol/L (ref 3.5–5.1)
SODIUM: 137 mmol/L (ref 135–145)
Total Bilirubin: 0.4 mg/dL (ref 0.3–1.2)
Total Protein: 6.8 g/dL (ref 6.5–8.1)

## 2018-01-25 LAB — CBC
HEMATOCRIT: 29.4 % — AB (ref 36.0–46.0)
Hemoglobin: 8.6 g/dL — ABNORMAL LOW (ref 12.0–15.0)
MCH: 24.7 pg — AB (ref 26.0–34.0)
MCHC: 29.3 g/dL — ABNORMAL LOW (ref 30.0–36.0)
MCV: 84.5 fL (ref 80.0–100.0)
PLATELETS: 424 10*3/uL — AB (ref 150–400)
RBC: 3.48 MIL/uL — ABNORMAL LOW (ref 3.87–5.11)
RDW: 15.2 % (ref 11.5–15.5)
WBC: 10.6 10*3/uL — ABNORMAL HIGH (ref 4.0–10.5)
nRBC: 0 % (ref 0.0–0.2)

## 2018-01-25 LAB — HEMOGLOBIN A1C
Hgb A1c MFr Bld: 7.9 % — ABNORMAL HIGH (ref 4.8–5.6)
Mean Plasma Glucose: 180.03 mg/dL

## 2018-01-25 LAB — GLUCOSE, CAPILLARY
Glucose-Capillary: 150 mg/dL — ABNORMAL HIGH (ref 70–99)
Glucose-Capillary: 167 mg/dL — ABNORMAL HIGH (ref 70–99)
Glucose-Capillary: 168 mg/dL — ABNORMAL HIGH (ref 70–99)
Glucose-Capillary: 189 mg/dL — ABNORMAL HIGH (ref 70–99)
Glucose-Capillary: 192 mg/dL — ABNORMAL HIGH (ref 70–99)
Glucose-Capillary: 223 mg/dL — ABNORMAL HIGH (ref 70–99)

## 2018-01-25 MED ORDER — COLCHICINE 0.6 MG PO TABS
0.6000 mg | ORAL_TABLET | Freq: Every day | ORAL | Status: DC
  Administered 2018-01-25 – 2018-01-27 (×3): 0.6 mg via ORAL
  Filled 2018-01-25 (×3): qty 1

## 2018-01-25 MED ORDER — IPRATROPIUM-ALBUTEROL 0.5-2.5 (3) MG/3ML IN SOLN
3.0000 mL | Freq: Four times a day (QID) | RESPIRATORY_TRACT | Status: DC
  Administered 2018-01-25 – 2018-01-26 (×4): 3 mL via RESPIRATORY_TRACT
  Filled 2018-01-25 (×4): qty 3

## 2018-01-25 MED ORDER — IPRATROPIUM BROMIDE 0.02 % IN SOLN: 0.5000 mg | Freq: Four times a day (QID) | RESPIRATORY_TRACT | Status: DC

## 2018-01-25 MED ORDER — BRIMONIDINE TARTRATE 0.2 % OP SOLN
1.0000 [drp] | Freq: Two times a day (BID) | OPHTHALMIC | Status: DC
  Administered 2018-01-25 – 2018-01-27 (×5): 1 [drp] via OPHTHALMIC
  Filled 2018-01-25: qty 5

## 2018-01-25 MED ORDER — ACETAMINOPHEN 500 MG PO TABS
500.0000 mg | ORAL_TABLET | Freq: Four times a day (QID) | ORAL | Status: DC | PRN
  Administered 2018-01-27: 500 mg via ORAL
  Filled 2018-01-25: qty 1

## 2018-01-25 MED ORDER — BUSPIRONE HCL 5 MG PO TABS
7.5000 mg | ORAL_TABLET | Freq: Three times a day (TID) | ORAL | Status: DC
  Administered 2018-01-25 – 2018-01-27 (×7): 7.5 mg via ORAL
  Filled 2018-01-25 (×7): qty 2

## 2018-01-25 MED ORDER — OLOPATADINE HCL 0.1 % OP SOLN
1.0000 [drp] | Freq: Two times a day (BID) | OPHTHALMIC | Status: DC
  Administered 2018-01-25 – 2018-01-27 (×6): 1 [drp] via OPHTHALMIC
  Filled 2018-01-25: qty 5

## 2018-01-25 MED ORDER — INSULIN GLARGINE 100 UNIT/ML ~~LOC~~ SOLN
40.0000 [IU] | Freq: Two times a day (BID) | SUBCUTANEOUS | Status: DC
  Administered 2018-01-25 – 2018-01-27 (×6): 40 [IU] via SUBCUTANEOUS
  Filled 2018-01-25 (×12): qty 0.4

## 2018-01-25 MED ORDER — TIMOLOL MALEATE 0.5 % OP SOLN
1.0000 [drp] | Freq: Two times a day (BID) | OPHTHALMIC | Status: DC
  Administered 2018-01-25 – 2018-01-27 (×5): 1 [drp] via OPHTHALMIC
  Filled 2018-01-25: qty 5

## 2018-01-25 MED ORDER — BUDESONIDE 0.25 MG/2ML IN SUSP
0.2500 mg | Freq: Two times a day (BID) | RESPIRATORY_TRACT | Status: DC
  Administered 2018-01-25 – 2018-01-27 (×4): 0.25 mg via RESPIRATORY_TRACT
  Filled 2018-01-25 (×4): qty 2

## 2018-01-25 MED ORDER — BRIMONIDINE TARTRATE-TIMOLOL 0.2-0.5 % OP SOLN
1.0000 [drp] | Freq: Two times a day (BID) | OPHTHALMIC | Status: DC
  Filled 2018-01-25: qty 5

## 2018-01-25 MED ORDER — GUAIFENESIN ER 600 MG PO TB12
600.0000 mg | ORAL_TABLET | Freq: Two times a day (BID) | ORAL | Status: DC
  Administered 2018-01-25 (×2): 600 mg via ORAL
  Filled 2018-01-25 (×2): qty 1

## 2018-01-25 MED ORDER — ALBUTEROL SULFATE (2.5 MG/3ML) 0.083% IN NEBU: 2.5000 mg | INHALATION_SOLUTION | Freq: Four times a day (QID) | RESPIRATORY_TRACT | Status: DC | PRN

## 2018-01-25 MED ORDER — ATORVASTATIN CALCIUM 40 MG PO TABS
40.0000 mg | ORAL_TABLET | Freq: Every day | ORAL | Status: DC
  Administered 2018-01-25 – 2018-01-27 (×3): 40 mg via ORAL
  Filled 2018-01-25 (×3): qty 1

## 2018-01-25 MED ORDER — IMIPRAMINE HCL 25 MG PO TABS
ORAL_TABLET | ORAL | Status: AC
  Filled 2018-01-25: qty 4

## 2018-01-25 MED ORDER — BENZONATATE 100 MG PO CAPS
200.0000 mg | ORAL_CAPSULE | Freq: Three times a day (TID) | ORAL | Status: DC | PRN
  Administered 2018-01-25 – 2018-01-26 (×2): 200 mg via ORAL
  Filled 2018-01-25 (×2): qty 2

## 2018-01-25 MED ORDER — HYDRALAZINE HCL 20 MG/ML IJ SOLN: 10.0000 mg | Freq: Four times a day (QID) | INTRAMUSCULAR | Status: DC | PRN

## 2018-01-25 MED ORDER — TOPIRAMATE 100 MG PO TABS
100.0000 mg | ORAL_TABLET | Freq: Two times a day (BID) | ORAL | Status: DC
  Administered 2018-01-25 – 2018-01-27 (×6): 100 mg via ORAL
  Filled 2018-01-25 (×6): qty 1

## 2018-01-25 MED ORDER — IPRATROPIUM-ALBUTEROL 0.5-2.5 (3) MG/3ML IN SOLN: 3.0000 mL | Freq: Three times a day (TID) | RESPIRATORY_TRACT | Status: DC

## 2018-01-25 MED ORDER — INSULIN ASPART 100 UNIT/ML ~~LOC~~ SOLN
0.0000 [IU] | Freq: Three times a day (TID) | SUBCUTANEOUS | Status: DC
  Administered 2018-01-25: 3 [IU] via SUBCUTANEOUS
  Administered 2018-01-25: 1 [IU] via SUBCUTANEOUS
  Administered 2018-01-25 – 2018-01-27 (×2): 2 [IU] via SUBCUTANEOUS

## 2018-01-25 MED ORDER — SODIUM CHLORIDE 0.9 % IV SOLN
INTRAVENOUS | Status: DC
  Administered 2018-01-25 – 2018-01-27 (×5): via INTRAVENOUS

## 2018-01-25 MED ORDER — KETOROLAC TROMETHAMINE 15 MG/ML IJ SOLN
15.0000 mg | Freq: Once | INTRAMUSCULAR | Status: AC
  Administered 2018-01-25: 15 mg via INTRAVENOUS
  Filled 2018-01-25: qty 1

## 2018-01-25 MED ORDER — TEMAZEPAM 15 MG PO CAPS
30.0000 mg | ORAL_CAPSULE | Freq: Every evening | ORAL | Status: DC | PRN
  Administered 2018-01-25: 30 mg via ORAL
  Filled 2018-01-25: qty 2

## 2018-01-25 MED ORDER — MONTELUKAST SODIUM 10 MG PO TABS
10.0000 mg | ORAL_TABLET | Freq: Every day | ORAL | Status: DC
  Administered 2018-01-25 – 2018-01-26 (×3): 10 mg via ORAL
  Filled 2018-01-25 (×3): qty 1

## 2018-01-25 MED ORDER — GUAIFENESIN ER 600 MG PO TB12
1200.0000 mg | ORAL_TABLET | Freq: Two times a day (BID) | ORAL | Status: DC
  Administered 2018-01-25 – 2018-01-27 (×4): 1200 mg via ORAL
  Filled 2018-01-25 (×4): qty 2

## 2018-01-25 MED ORDER — CLONIDINE HCL 0.2 MG PO TABS
0.2000 mg | ORAL_TABLET | Freq: Three times a day (TID) | ORAL | Status: DC
  Administered 2018-01-25 – 2018-01-27 (×7): 0.2 mg via ORAL
  Filled 2018-01-25 (×7): qty 1

## 2018-01-25 MED ORDER — SODIUM CHLORIDE 0.9 % IV SOLN
1.0000 g | INTRAVENOUS | Status: DC
  Administered 2018-01-25 – 2018-01-26 (×2): 1 g via INTRAVENOUS
  Filled 2018-01-25: qty 1
  Filled 2018-01-25: qty 10
  Filled 2018-01-25: qty 1

## 2018-01-25 MED ORDER — ACYCLOVIR 800 MG PO TABS
400.0000 mg | ORAL_TABLET | Freq: Three times a day (TID) | ORAL | Status: DC
  Administered 2018-01-25 – 2018-01-27 (×7): 400 mg via ORAL
  Filled 2018-01-25 (×7): qty 1

## 2018-01-25 MED ORDER — GUAIFENESIN ER 600 MG PO TB12: 600.0000 mg | ORAL_TABLET | Freq: Two times a day (BID) | ORAL | Status: DC | PRN

## 2018-01-25 MED ORDER — DONEPEZIL HCL 5 MG PO TABS
10.0000 mg | ORAL_TABLET | Freq: Every day | ORAL | Status: DC
  Administered 2018-01-25 – 2018-01-26 (×3): 10 mg via ORAL
  Filled 2018-01-25: qty 2
  Filled 2018-01-25: qty 1
  Filled 2018-01-25 (×2): qty 2

## 2018-01-25 MED ORDER — ALBUTEROL SULFATE (2.5 MG/3ML) 0.083% IN NEBU: 2.5000 mg | INHALATION_SOLUTION | Freq: Four times a day (QID) | RESPIRATORY_TRACT | Status: DC

## 2018-01-25 MED ORDER — AMLODIPINE BESYLATE 5 MG PO TABS
10.0000 mg | ORAL_TABLET | Freq: Every day | ORAL | Status: DC
  Administered 2018-01-25 – 2018-01-27 (×3): 10 mg via ORAL
  Filled 2018-01-25 (×3): qty 2

## 2018-01-25 MED ORDER — DABIGATRAN ETEXILATE MESYLATE 150 MG PO CAPS
150.0000 mg | ORAL_CAPSULE | Freq: Two times a day (BID) | ORAL | Status: DC
  Administered 2018-01-25 – 2018-01-27 (×6): 150 mg via ORAL
  Filled 2018-01-25 (×8): qty 1

## 2018-01-25 MED ORDER — IMIPRAMINE HCL 25 MG PO TABS
100.0000 mg | ORAL_TABLET | Freq: Every day | ORAL | Status: DC
  Administered 2018-01-25 – 2018-01-26 (×3): 100 mg via ORAL
  Filled 2018-01-25: qty 2
  Filled 2018-01-25: qty 4
  Filled 2018-01-25 (×2): qty 2
  Filled 2018-01-25: qty 4

## 2018-01-25 MED ORDER — OXYCODONE-ACETAMINOPHEN 5-325 MG PO TABS
2.0000 | ORAL_TABLET | Freq: Two times a day (BID) | ORAL | Status: DC
  Administered 2018-01-25 – 2018-01-27 (×6): 2 via ORAL
  Filled 2018-01-25 (×6): qty 2

## 2018-01-25 NOTE — Care Management Obs Status (Signed)
[facility] NOTIFICATION   Patient Details  Name: [name] MRN: [id number] Date of Birth: 29-Dec-1940   Medicare Observation Status Notification Given:  Yes    Sherald Barge, RN 01/25/2018, 10:48 AM

## 2018-01-25 NOTE — Progress Notes (Signed)
PROGRESS NOTE    [name]  QVZ:[id number] DOB: 02-Dec-1940 DOA: 01/24/2018 PCP: [name], MD    Brief Narrative:  77 year old female with a history of diabetes, hypertension, pulmonary embolism on chronic anticoagulation, presents to the hospital with cough, runny nose fever and generalized body aches.  She is found to have possible acute bronchitis and is being treated supportively.  Urinalysis also indicates possible infection.  She is had repeated infection in the past few months and has had taken several courses of antibiotics.  She is currently on intravenous ceftriaxone.  Urine culture in process.   Assessment & Plan:   Active Problems:   Essential hypertension   Gastroesophageal reflux disease   Chronic anticoagulation   Diabetes mellitus, insulin dependent (IDDM), uncontrolled ([facility])   Acute bronchitis   UTI (urinary tract infection)   History of pulmonary embolism   1. Urinary tract infection.  Patient has a history of recurrent UTIs.  Urinalysis on admission indicates possible infection.  She also complains of dysuria.  She been started on ceftriaxone.  She is recently completed several courses of antibiotics for UTI.  Since she has history of recurrent UTIs and has completed recent antibiotics, I think it is reasonable to keep the patient in the hospital on ceftriaxone until urine culture results are available to ensure that she does not have a resistant organism. 2. Acute bronchitis.  Influenza panel negative.  Will continue on bronchodilators and mucolytic's.  If wheezing persists, may need to consider prednisone taper. 3. History of pulmonary embolism.  She is currently on Pradaxa. 4. Diabetes.  Continue on Lantus and sliding scale insulin.  Blood sugars are currently stable. 5. Hypertension.  Blood pressures are currently elevated.  Continue home regimen and use hydralazine as needed.   DVT prophylaxis: Pradaxa Code Status: DNR Family Communication:  Discussed with family present at bedside Disposition Plan: Discharge home once urine culture results are available   Consultants:     Procedures:     Antimicrobials:   Ceftriaxone 12/10 >   Subjective: Complains of congestion, wheezing and cough.  Does not feel well.  Objective: Vitals:   01/25/18 0046 01/25/18 0552 01/25/18 0855 01/25/18 1356  BP: (!) 177/96 (!) 110/37 (!) 177/84 (!) 152/80  Pulse: (!) 122 99 (!) 106 87  Resp:   18 19  Temp: 98.5 F (36.9 C) 98.1 F (36.7 C) 98.4 F (36.9 C) 97.6 F (36.4 C)  TempSrc: Oral Oral Oral Oral  SpO2: 97% 93% 97% 95%  Weight:      Height:        Intake/Output Summary (Last 24 hours) at 01/25/2018 1901 Last data filed at 01/25/2018 1700 Gross per 24 hour  Intake 2026.53 ml  Output -  Net 2026.53 ml   Filed Weights   01/24/18 2019  Weight: 106 kg    Examination:  General exam: Appears calm and comfortable  Respiratory system: bilateral wheezing. Respiratory effort normal. Cardiovascular system: S1 & S2 heard, RRR. No JVD, murmurs, rubs, gallops or clicks. No pedal edema. Gastrointestinal system: Abdomen is nondistended, soft and nontender. No organomegaly or masses felt. Normal bowel sounds heard. Central nervous system: Alert and oriented. No focal neurological deficits. Extremities: Symmetric 5 x 5 power. Skin: No rashes, lesions or ulcers Psychiatry: Judgement and insight appear normal. Mood & affect appropriate.     Data Reviewed: I have personally reviewed following labs and imaging studies  CBC: Recent Labs  Lab 01/24/18 2035 01/25/18 0513  WBC 10.8* 10.6*  NEUTROABS 7.2  --   HGB 9.2* 8.6*  HCT 31.3* 29.4*  MCV 84.1 84.5  PLT 467* 427*   Basic Metabolic Panel: Recent Labs  Lab 01/24/18 2035 01/25/18 0513  NA 136 137  K 3.7 3.5  CL 103 105  CO2 24 23  GLUCOSE 285* 202*  BUN 21 19  CREATININE 1.25* 1.10*  CALCIUM 9.1 8.7*   GFR: Estimated Creatinine Clearance: 52.8 mL/min (A) (by  C-G formula based on SCr of 1.1 mg/dL (H)). Liver Function Tests: Recent Labs  Lab 01/24/18 2035 01/25/18 0513  AST 26 21  ALT 31 26  ALKPHOS 96 83  BILITOT 0.4 0.4  PROT 7.6 6.8  ALBUMIN 3.4* 3.2*   No results for input(s): LIPASE, AMYLASE in the last 168 hours. No results for input(s): AMMONIA in the last 168 hours. Coagulation Profile: No results for input(s): INR, PROTIME in the last 168 hours. Cardiac Enzymes: Recent Labs  Lab 01/24/18 2035  TROPONINI <0.03   BNP (last 3 results) No results for input(s): PROBNP in the last 8760 hours. HbA1C: Recent Labs    01/24/18 2035  HGBA1C 7.9*   CBG: Recent Labs  Lab 01/25/18 0049 01/25/18 0804 01/25/18 1115 01/25/18 1653  GLUCAP 167* 150* 168* 223*   Lipid Profile: No results for input(s): CHOL, HDL, LDLCALC, TRIG, CHOLHDL, LDLDIRECT in the last 72 hours. Thyroid Function Tests: No results for input(s): TSH, T4TOTAL, FREET4, T3FREE, THYROIDAB in the last 72 hours. Anemia Panel: No results for input(s): VITAMINB12, FOLATE, FERRITIN, TIBC, IRON, RETICCTPCT in the last 72 hours. Sepsis Labs: No results for input(s): PROCALCITON, LATICACIDVEN in the last 168 hours.  No results found for this or any previous visit (from the past 240 hour(s)).       Radiology Studies: Dg Chest 2 View  Result Date: 01/24/2018 CLINICAL DATA:  77 year old female with cough and fever. EXAM: CHEST - 2 VIEW COMPARISON:  Chest radiograph dated 06/20/2017 FINDINGS: The heart size and mediastinal contours are within normal limits. Both lungs are clear. The visualized skeletal structures are unremarkable. IMPRESSION: No active cardiopulmonary disease. Electronically Signed   By: Anner Crete M.D.   On: 01/24/2018 21:21   Ct Angio Chest Pe W And/or Wo Contrast  Result Date: 01/24/2018 CLINICAL DATA:  Cough, chest congestion and myalgias for the past week. Chest pain with inspiration. EXAM: CT ANGIOGRAPHY CHEST WITH CONTRAST TECHNIQUE:  Multidetector CT imaging of the chest was performed using the standard protocol during bolus administration of intravenous contrast. Multiplanar CT image reconstructions and MIPs were obtained to evaluate the vascular anatomy. CONTRAST:  134mL ISOVUE-370 IOPAMIDOL (ISOVUE-370) INJECTION 76% COMPARISON:  Chest radiographs obtained earlier today. Chest CTA dated 07/16/2010 FINDINGS: Cardiovascular: Normally opacified pulmonary arteries with no pulmonary arterial filling defects seen. Mildly enlarged heart. Mediastinum/Nodes: No enlarged mediastinal, hilar, or axillary lymph nodes. Thyroid gland, trachea, and esophagus demonstrate no significant findings. Lungs/Pleura: Lungs are clear. No pleural effusion or pneumothorax. Upper Abdomen: Unremarkable. Musculoskeletal: Thoracic spine degenerative changes. Review of the MIP images confirms the above findings. IMPRESSION: No pulmonary emboli or acute abnormality. Electronically Signed   By: Claudie Revering M.D.   On: 01/24/2018 22:54        Scheduled Meds: . acyclovir  400 mg Oral TID  . amLODipine  10 mg Oral Daily  . atorvastatin  40 mg Oral Daily  . brimonidine  1 drop Both Eyes BID   And  . timolol  1 drop Both Eyes BID  . budesonide (PULMICORT) nebulizer  solution  0.25 mg Nebulization BID  . busPIRone  7.5 mg Oral TID  . cloNIDine  0.2 mg Oral TID  . colchicine  0.6 mg Oral Daily  . dabigatran  150 mg Oral Q12H  . donepezil  10 mg Oral QHS  . guaiFENesin  1,200 mg Oral BID  . imipramine  100 mg Oral QHS  . insulin aspart  0-9 Units Subcutaneous TID WC  . insulin glargine  40 Units Subcutaneous BID  . ipratropium-albuterol  3 mL Nebulization Q6H  . montelukast  10 mg Oral QHS  . olopatadine  1 drop Both Eyes BID  . oxyCODONE-acetaminophen  2 tablet Oral BID  . topiramate  100 mg Oral BID   Continuous Infusions: . sodium chloride 100 mL/hr at 01/25/18 1613  . cefTRIAXone (ROCEPHIN)  IV       LOS: 0 days    Time spent:  24mins    Kathie Dike, MD [facility] Pager [phone]  If 7PM-7AM, please contact night-coverage [email or web address] Password [email or web address] 01/25/2018, 7:01 PM

## 2018-01-25 NOTE — Care Management Note (Signed)
Case Management Note  Patient Details  Name: [name] MRN: [id number] Date of Birth: [birth year]  Subjective/Objective:  Observation for UTI. Pt from home, lives with family. Pt ind with ADL's. Sister drives her to appointments. He has no HH or DME needs pta. Pt communicates no needs or concerns bout DC.                   Action/Plan: DC home in next 24 hrs with self care.   Expected Discharge Date:  01/25/18               Expected Discharge Plan:  [place]  In-House Referral:  NA  Status of Service:  Completed, signed off   Sherald Barge, RN 01/25/2018, 1:00 PM

## 2018-01-26 DIAGNOSIS — I10 Essential (primary) hypertension: Secondary | ICD-10-CM

## 2018-01-26 DIAGNOSIS — Z794 Long term (current) use of insulin: Secondary | ICD-10-CM

## 2018-01-26 DIAGNOSIS — J209 Acute bronchitis, unspecified: Secondary | ICD-10-CM

## 2018-01-26 DIAGNOSIS — K219 Gastro-esophageal reflux disease without esophagitis: Secondary | ICD-10-CM

## 2018-01-26 DIAGNOSIS — E1165 Type 2 diabetes mellitus with hyperglycemia: Secondary | ICD-10-CM

## 2018-01-26 DIAGNOSIS — Z7901 Long term (current) use of anticoagulants: Secondary | ICD-10-CM

## 2018-01-26 DIAGNOSIS — N3000 Acute cystitis without hematuria: Principal | ICD-10-CM

## 2018-01-26 LAB — URINE CULTURE: Special Requests: NORMAL

## 2018-01-26 LAB — BASIC METABOLIC PANEL
Anion gap: 7 (ref 5–15)
BUN: 20 mg/dL (ref 8–23)
CO2: 22 mmol/L (ref 22–32)
Calcium: 8.9 mg/dL (ref 8.9–10.3)
Chloride: 109 mmol/L (ref 98–111)
Creatinine, Ser: 1.17 mg/dL — ABNORMAL HIGH (ref 0.44–1.00)
GFR calc Af Amer: 52 mL/min — ABNORMAL LOW (ref >60)
GFR calc non Af Amer: 45 mL/min — ABNORMAL LOW (ref >60)
Glucose, Bld: 151 mg/dL — ABNORMAL HIGH (ref 70–99)
Potassium: 4 mmol/L (ref 3.5–5.1)
Sodium: 138 mmol/L (ref 135–145)

## 2018-01-26 LAB — GLUCOSE, CAPILLARY
GLUCOSE-CAPILLARY: 120 mg/dL — AB (ref 70–99)
Glucose-Capillary: 108 mg/dL — ABNORMAL HIGH (ref 70–99)
Glucose-Capillary: 128 mg/dL — ABNORMAL HIGH (ref 70–99)
Glucose-Capillary: 167 mg/dL — ABNORMAL HIGH (ref 70–99)

## 2018-01-26 LAB — CBC
HCT: 30.8 % — ABNORMAL LOW (ref 36.0–46.0)
Hemoglobin: 8.9 g/dL — ABNORMAL LOW (ref 12.0–15.0)
MCH: 25.3 pg — ABNORMAL LOW (ref 26.0–34.0)
MCHC: 28.9 g/dL — ABNORMAL LOW (ref 30.0–36.0)
MCV: 87.5 fL (ref 80.0–100.0)
Platelets: 420 10*3/uL — ABNORMAL HIGH (ref 150–400)
RBC: 3.52 MIL/uL — ABNORMAL LOW (ref 3.87–5.11)
RDW: 15 % (ref 11.5–15.5)
WBC: 9.3 10*3/uL (ref 4.0–10.5)
nRBC: 0 % (ref 0.0–0.2)

## 2018-01-26 MED ORDER — ALUM & MAG HYDROXIDE-SIMETH 200-200-20 MG/5ML PO SUSP
30.0000 mL | Freq: Four times a day (QID) | ORAL | Status: DC | PRN
  Administered 2018-01-26: 30 mL via ORAL
  Filled 2018-01-26: qty 30

## 2018-01-26 MED ORDER — IPRATROPIUM-ALBUTEROL 0.5-2.5 (3) MG/3ML IN SOLN
3.0000 mL | Freq: Two times a day (BID) | RESPIRATORY_TRACT | Status: DC
  Administered 2018-01-27: 3 mL via RESPIRATORY_TRACT
  Filled 2018-01-26: qty 3

## 2018-01-26 NOTE — Evaluation (Signed)
Physical Therapy Evaluation Patient Details Name: [name] MRN: [id number] DOB: [birth year] Today's Date: 01/26/2018   History of Present Illness  Kariel Skillman  is a 77 y.o. female, with history of diabetes mellitus type 2, pulmonary embolism on chronic anticoagulation with Pradaxa since 2012, dyslipidemia, hypertension, GERD came to hospital with complaints of cough, runny nose, fever, generalized body aches.  Patient has had repeated UTIs over the past few months.  Last antibiotics 2 weeks ago at that time she was prescribed ciprofloxacin.  Patient does complain of dysuria denies abdominal or flank pain.  Complains of urgency    Clinical Impression  Patient functioning near baseline for functional mobility and gait, demonstrates labored movement for sitting up at bedside, sit to stands, transfers and ambulation in hallway without loss of balance, limited mostly due to c/o fatigue and tolerated sitting up in chair after therapy.  Patient will benefit from continued physical therapy in hospital and recommended venue below to increase strength, balance, endurance for safe ADLs and gait.     Follow Up Recommendations Home health PT;Supervision - Intermittent    Equipment Recommendations  None recommended by PT    Recommendations for Other Services       Precautions / Restrictions Precautions Precautions: Fall Restrictions Weight Bearing Restrictions: No      Mobility  Bed Mobility   Bed Mobility: Supine to Sit     Supine to sit: Supervision     General bed mobility comments: labored movement  Transfers Overall transfer level: Needs assistance Equipment used: Rolling walker (2 wheeled) Transfers: Sit to/from [employer] Transfers Sit to Stand: Supervision Stand pivot transfers: Min guard       General transfer comment: slow labored movement  Ambulation/Gait Ambulation/Gait assistance: Min guard Gait Distance (Feet): 30 Feet Assistive device: Rolling walker  (2 wheeled) Gait Pattern/deviations: Decreased step length - right;Decreased step length - left;Decreased stance time - left Gait velocity: decreased   General Gait Details: slow labored cadence without loss of balance, limited secondary to c/o fatigue  Stairs            Wheelchair Mobility    Modified Rankin (Stroke Patients Only)       Balance Overall balance assessment: Needs assistance Sitting-balance support: Feet supported;No upper extremity supported Sitting balance-Leahy Scale: Good     Standing balance support: Bilateral upper extremity supported;During functional activity Standing balance-Leahy Scale: Fair Standing balance comment: using RW                             Pertinent Vitals/Pain Pain Assessment: No/denies pain    Home Living Family/patient expects to be discharged to:: Private residence Living Arrangements: Children Available Help at Discharge: Family Type of Home: Mobile home Home Access: Stairs to enter Entrance Stairs-Rails: Right;Left;Can reach both Entrance Stairs-Number of Steps: 2-3 Home Layout: One level Home Equipment: Walker - 2 wheels;Cane - single point;Shower seat;Bedside commode;Wheelchair - manual      Prior Function Level of Independence: Needs assistance   Gait / Transfers Assistance Needed: household ambulator with RW  ADL's / Homemaking Assistance Needed: assisted by her daughter        Hand Dominance   Dominant Hand: Right    Extremity/Trunk Assessment   Upper Extremity Assessment Upper Extremity Assessment: Generalized weakness    Lower Extremity Assessment Lower Extremity Assessment: Generalized weakness    Cervical / Trunk Assessment Cervical / Trunk Assessment: Kyphotic  Communication   Communication: No difficulties  Cognition Arousal/Alertness: Awake/alert Behavior During Therapy: WFL for tasks assessed/performed Overall Cognitive Status: Within Functional Limits for tasks assessed                                         General Comments      Exercises     Assessment/Plan    PT Assessment Patient needs continued PT services  PT Problem List Decreased strength;Decreased activity tolerance;Decreased balance;Decreased mobility       PT Treatment Interventions Gait training;Stair training;Functional mobility training;Therapeutic activities;Therapeutic exercise;Patient/family education    PT Goals (Current goals can be found in the Care Plan section)  Acute Rehab PT Goals Patient Stated Goal: return home with family to assist PT Goal Formulation: With patient Time For Goal Achievement: 02/02/18 Potential to Achieve Goals: Good    Frequency Min 3X/week   Barriers to discharge        Co-evaluation               AM-PAC PT "6 Clicks" Mobility  Outcome Measure Help needed turning from your back to your side while in a flat bed without using bedrails?: None Help needed moving from lying on your back to sitting on the side of a flat bed without using bedrails?: None Help needed moving to and from a bed to a chair (including a wheelchair)?: A Little Help needed standing up from a chair using your arms (e.g., wheelchair or bedside chair)?: A Little Help needed to walk in hospital room?: A Little Help needed climbing 3-5 steps with a railing? : A Lot 6 Click Score: 19    End of Session   Activity Tolerance: Patient tolerated treatment well;Patient limited by fatigue Patient left: in chair;with call bell/phone within reach Nurse Communication: Mobility status PT Visit Diagnosis: Unsteadiness on feet (R26.81);Other abnormalities of gait and mobility (R26.89);Muscle weakness (generalized) (M62.81)    Time: 6270-3500 PT Time Calculation (min) (ACUTE ONLY): 28 min   Charges:   PT Evaluation $PT Eval Moderate Complexity: 1 Mod PT Treatments $Therapeutic Activity: 23-37 mins        1:27 PM, 01/26/18 Lonell Grandchild, MPT Physical  Therapist with [facility] [phone] office [phone] mobile phone

## 2018-01-26 NOTE — Care Management Note (Signed)
Case Management Note  Patient Details  Name: [name] MRN: [id number] Date of Birth: December 09, 1940  Action/Plan: Pt agreeable to [facility] PT, per PT recommendations. Pt given provider list and deferred decision to sister. Sister has chosen [facility], aware HH has 48 hrs to make first visit. Vaughan Basta, [facility] rep, given referral.   Expected Discharge Date:  01/25/18               Expected Discharge Plan:  [place]  In-House Referral:  NA  Discharge planning Services  CM Consult  Post Acute Care Choice:  Home Health Choice offered to:  Patient, Sibling  HH Arranged:  PT [place]:  [facility]  Status of Service:  Completed, signed off  Sherald Barge, RN 01/26/2018, 11:55 AM

## 2018-01-26 NOTE — Progress Notes (Signed)
PROGRESS NOTE    [name]  HBZ:[id number] DOB: [birth year] DOA: 01/24/2018 PCP: [name], MD    Brief Narrative:  77 year old female with a history of diabetes, hypertension, pulmonary embolism on chronic anticoagulation, presents to the hospital with cough, runny nose fever and generalized body aches.  She is found to have possible acute bronchitis and is being treated supportively.  Urinalysis also indicates possible infection.  She is had repeated infection in the past few months and has had taken several courses of antibiotics.  She is currently on intravenous ceftriaxone.  Urine culture in process.  Assessment & Plan:   Active Problems:   Essential hypertension   Gastroesophageal reflux disease   Chronic anticoagulation   Diabetes mellitus, insulin dependent (IDDM), uncontrolled (HCC)   Acute bronchitis   UTI (urinary tract infection)   History of pulmonary embolism   1. Urinary tract infection.  Patient has a history of recurrent UTIs.  Urinalysis on admission indicates possible infection.  She also complains of dysuria.  She been started on ceftriaxone.  She is recently completed several courses of antibiotics for UTI.  Since she has history of recurrent UTIs and has completed recent antibiotics, I think it is reasonable to keep the patient in the hospital on ceftriaxone until urine culture results are available to ensure that she does not have a resistant organism.  Urine culture remains pending.  2. Acute bronchitis.  Improving.  Influenza panel negative.  Will continue on bronchodilators and mucolytic's.  If wheezing persists, may need to consider prednisone taper. 3. History of pulmonary embolism.  She is currently on Pradaxa. 4. Type 2 diabetes mellitus - Continue Lantus and sliding scale insulin.  Blood sugars are currently stable. 5. Hypertension.  Blood pressures are currently elevated.  Continue home regimen and use hydralazine as needed.  CBG (last 3)    Recent Labs    01/25/18 2129 01/25/18 2214 01/26/18 0724  GLUCAP 192* 189* 108*   DVT prophylaxis: Pradaxa Code Status: DNR Family Communication: Discussed with family present at bedside Disposition Plan: Discharge home once urine culture results are available   Consultants:     Procedures:     Antimicrobials:   Ceftriaxone 12/10 >   Subjective: Pt says that she is feeling a little better but not back to her baseline status.  She has had persistent dysuria but it somewhat better.    Objective: Vitals:   01/25/18 2105 01/25/18 2219 01/26/18 0546 01/26/18 0750  BP: (!) 146/69  (!) 145/81   Pulse: 88  89   Resp: 17  17   Temp: (!) 97.5 F (36.4 C)  (!) 97.5 F (36.4 C)   TempSrc: Oral  Oral   SpO2: 95% 95% 100% 98%  Weight:      Height:        Intake/Output Summary (Last 24 hours) at 01/26/2018 1043 Last data filed at 01/26/2018 0355 Gross per 24 hour  Intake 2926.8 ml  Output -  Net 2926.8 ml   Filed Weights   01/24/18 2019  Weight: 106 kg    Examination:  General exam: Appears calm and comfortable. NAD.  Respiratory system: persistent bilateral wheezing. Respiratory effort normal. Cardiovascular system: S1 & S2 heard, RRR. No JVD, murmurs, rubs, gallops or clicks. No pedal edema. Gastrointestinal system: Abdomen is nondistended, soft and nontender. No organomegaly or masses felt. Normal bowel sounds heard. Central nervous system: Alert and oriented. No focal neurological deficits. Extremities: Symmetric 5 x 5 power. Skin: No rashes,  lesions or ulcers Psychiatry: Judgement and insight appear normal. Mood & affect appropriate.   Data Reviewed: I have personally reviewed following labs and imaging studies  CBC: Recent Labs  Lab 01/24/18 2035 01/25/18 0513 01/26/18 0500  WBC 10.8* 10.6* 9.3  NEUTROABS 7.2  --   --   HGB 9.2* 8.6* 8.9*  HCT 31.3* 29.4* 30.8*  MCV 84.1 84.5 87.5  PLT 467* 424* 702*   Basic Metabolic Panel: Recent Labs   Lab 01/24/18 2035 01/25/18 0513 01/26/18 0500  NA 136 137 138  K 3.7 3.5 4.0  CL 103 105 109  CO2 24 23 22   GLUCOSE 285* 202* 151*  BUN 21 19 20   CREATININE 1.25* 1.10* 1.17*  CALCIUM 9.1 8.7* 8.9   GFR: Estimated Creatinine Clearance: 49.7 mL/min (A) (by C-G formula based on SCr of 1.17 mg/dL (H)). Liver Function Tests: Recent Labs  Lab 01/24/18 2035 01/25/18 0513  AST 26 21  ALT 31 26  ALKPHOS 96 83  BILITOT 0.4 0.4  PROT 7.6 6.8  ALBUMIN 3.4* 3.2*   No results for input(s): LIPASE, AMYLASE in the last 168 hours. No results for input(s): AMMONIA in the last 168 hours. Coagulation Profile: No results for input(s): INR, PROTIME in the last 168 hours. Cardiac Enzymes: Recent Labs  Lab 01/24/18 2035  TROPONINI <0.03   BNP (last 3 results) No results for input(s): PROBNP in the last 8760 hours. HbA1C: Recent Labs    01/24/18 2035  HGBA1C 7.9*   CBG: Recent Labs  Lab 01/25/18 1115 01/25/18 1653 01/25/18 2129 01/25/18 2214 01/26/18 0724  GLUCAP 168* 223* 192* 189* 108*   Lipid Profile: No results for input(s): CHOL, HDL, LDLCALC, TRIG, CHOLHDL, LDLDIRECT in the last 72 hours. Thyroid Function Tests: No results for input(s): TSH, T4TOTAL, FREET4, T3FREE, THYROIDAB in the last 72 hours. Anemia Panel: No results for input(s): VITAMINB12, FOLATE, FERRITIN, TIBC, IRON, RETICCTPCT in the last 72 hours. Sepsis Labs: No results for input(s): PROCALCITON, LATICACIDVEN in the last 168 hours.  No results found for this or any previous visit (from the past 240 hour(s)).  Radiology Studies: Dg Chest 2 View  Result Date: 01/24/2018 CLINICAL DATA:  77 year old female with cough and fever. EXAM: CHEST - 2 VIEW COMPARISON:  Chest radiograph dated 06/20/2017 FINDINGS: The heart size and mediastinal contours are within normal limits. Both lungs are clear. The visualized skeletal structures are unremarkable. IMPRESSION: No active cardiopulmonary disease. Electronically  Signed   By: Anner Crete M.D.   On: 01/24/2018 21:21   Ct Angio Chest Pe W And/or Wo Contrast  Result Date: 01/24/2018 CLINICAL DATA:  Cough, chest congestion and myalgias for the past week. Chest pain with inspiration. EXAM: CT ANGIOGRAPHY CHEST WITH CONTRAST TECHNIQUE: Multidetector CT imaging of the chest was performed using the standard protocol during bolus administration of intravenous contrast. Multiplanar CT image reconstructions and MIPs were obtained to evaluate the vascular anatomy. CONTRAST:  121mL ISOVUE-370 IOPAMIDOL (ISOVUE-370) INJECTION 76% COMPARISON:  Chest radiographs obtained earlier today. Chest CTA dated 07/16/2010 FINDINGS: Cardiovascular: Normally opacified pulmonary arteries with no pulmonary arterial filling defects seen. Mildly enlarged heart. Mediastinum/Nodes: No enlarged mediastinal, hilar, or axillary lymph nodes. Thyroid gland, trachea, and esophagus demonstrate no significant findings. Lungs/Pleura: Lungs are clear. No pleural effusion or pneumothorax. Upper Abdomen: Unremarkable. Musculoskeletal: Thoracic spine degenerative changes. Review of the MIP images confirms the above findings. IMPRESSION: No pulmonary emboli or acute abnormality. Electronically Signed   By: Claudie Revering M.D.   On: 01/24/2018  22:54   Scheduled Meds: . acyclovir  400 mg Oral TID  . amLODipine  10 mg Oral Daily  . atorvastatin  40 mg Oral Daily  . brimonidine  1 drop Both Eyes BID   And  . timolol  1 drop Both Eyes BID  . budesonide (PULMICORT) nebulizer solution  0.25 mg Nebulization BID  . busPIRone  7.5 mg Oral TID  . cloNIDine  0.2 mg Oral TID  . colchicine  0.6 mg Oral Daily  . dabigatran  150 mg Oral Q12H  . donepezil  10 mg Oral QHS  . guaiFENesin  1,200 mg Oral BID  . imipramine  100 mg Oral QHS  . insulin aspart  0-9 Units Subcutaneous TID WC  . insulin glargine  40 Units Subcutaneous BID  . ipratropium-albuterol  3 mL Nebulization Q6H  . montelukast  10 mg Oral QHS  .  olopatadine  1 drop Both Eyes BID  . oxyCODONE-acetaminophen  2 tablet Oral BID  . topiramate  100 mg Oral BID   Continuous Infusions: . sodium chloride 50 mL/hr at 01/26/18 0904  . cefTRIAXone (ROCEPHIN)  IV Stopped (01/25/18 2156)     LOS: 0 days    Time spent: 11mins Irwin Brakeman, MD [facility] Pager [phone]  If 7PM-7AM, please contact night-coverage [email or web address] Password [facility] 01/26/2018, 10:43 AM

## 2018-01-26 NOTE — Plan of Care (Signed)
  Problem: Acute Rehab PT Goals(only PT should resolve) Goal: Pt Will Go Supine/Side To Sit Flowsheets (Taken 01/26/2018 1329) Pt will go Supine/Side to Sit: with modified independence Goal: Patient Will Transfer Sit To/From Stand Flowsheets (Taken 01/26/2018 1329) Patient will transfer sit to/from stand: with supervision Goal: Pt Will Transfer Bed To Chair/Chair To Bed Flowsheets (Taken 01/26/2018 1329) Pt will Transfer Bed to Chair/Chair to Bed: with supervision Goal: Pt Will Ambulate Flowsheets (Taken 01/26/2018 1329) Pt will Ambulate: 50 feet; with supervision; with rolling walker   1:29 PM, 01/26/18 Lonell Grandchild, MPT Physical Therapist with [facility] [phone] office [phone] mobile phone

## 2018-01-27 ENCOUNTER — Encounter (HOSPITAL_COMMUNITY): Payer: Self-pay | Admitting: Family Medicine

## 2018-01-27 ENCOUNTER — Telehealth: Payer: Self-pay

## 2018-01-27 DIAGNOSIS — E785 Hyperlipidemia, unspecified: Secondary | ICD-10-CM | POA: Diagnosis present

## 2018-01-27 DIAGNOSIS — Z86711 Personal history of pulmonary embolism: Secondary | ICD-10-CM | POA: Diagnosis not present

## 2018-01-27 DIAGNOSIS — N179 Acute kidney failure, unspecified: Secondary | ICD-10-CM | POA: Diagnosis present

## 2018-01-27 DIAGNOSIS — I10 Essential (primary) hypertension: Secondary | ICD-10-CM | POA: Diagnosis present

## 2018-01-27 DIAGNOSIS — F329 Major depressive disorder, single episode, unspecified: Secondary | ICD-10-CM | POA: Diagnosis present

## 2018-01-27 DIAGNOSIS — Z66 Do not resuscitate: Secondary | ICD-10-CM | POA: Diagnosis present

## 2018-01-27 DIAGNOSIS — Z794 Long term (current) use of insulin: Secondary | ICD-10-CM | POA: Diagnosis not present

## 2018-01-27 DIAGNOSIS — Z96652 Presence of left artificial knee joint: Secondary | ICD-10-CM | POA: Diagnosis present

## 2018-01-27 DIAGNOSIS — I4891 Unspecified atrial fibrillation: Secondary | ICD-10-CM | POA: Diagnosis present

## 2018-01-27 DIAGNOSIS — Z87440 Personal history of urinary (tract) infections: Secondary | ICD-10-CM | POA: Diagnosis not present

## 2018-01-27 DIAGNOSIS — D509 Iron deficiency anemia, unspecified: Secondary | ICD-10-CM | POA: Diagnosis present

## 2018-01-27 DIAGNOSIS — Z833 Family history of diabetes mellitus: Secondary | ICD-10-CM | POA: Diagnosis not present

## 2018-01-27 DIAGNOSIS — N3000 Acute cystitis without hematuria: Secondary | ICD-10-CM | POA: Diagnosis present

## 2018-01-27 DIAGNOSIS — G4700 Insomnia, unspecified: Secondary | ICD-10-CM | POA: Diagnosis present

## 2018-01-27 DIAGNOSIS — H409 Unspecified glaucoma: Secondary | ICD-10-CM | POA: Diagnosis present

## 2018-01-27 DIAGNOSIS — J209 Acute bronchitis, unspecified: Secondary | ICD-10-CM | POA: Diagnosis present

## 2018-01-27 DIAGNOSIS — R509 Fever, unspecified: Secondary | ICD-10-CM | POA: Diagnosis present

## 2018-01-27 DIAGNOSIS — Z8261 Family history of arthritis: Secondary | ICD-10-CM | POA: Diagnosis not present

## 2018-01-27 DIAGNOSIS — K219 Gastro-esophageal reflux disease without esophagitis: Secondary | ICD-10-CM | POA: Diagnosis present

## 2018-01-27 DIAGNOSIS — F419 Anxiety disorder, unspecified: Secondary | ICD-10-CM | POA: Diagnosis present

## 2018-01-27 DIAGNOSIS — E1165 Type 2 diabetes mellitus with hyperglycemia: Secondary | ICD-10-CM | POA: Diagnosis not present

## 2018-01-27 DIAGNOSIS — E119 Type 2 diabetes mellitus without complications: Secondary | ICD-10-CM | POA: Diagnosis present

## 2018-01-27 DIAGNOSIS — Z8249 Family history of ischemic heart disease and other diseases of the circulatory system: Secondary | ICD-10-CM | POA: Diagnosis not present

## 2018-01-27 DIAGNOSIS — Z7901 Long term (current) use of anticoagulants: Secondary | ICD-10-CM | POA: Diagnosis not present

## 2018-01-27 DIAGNOSIS — G8929 Other chronic pain: Secondary | ICD-10-CM | POA: Diagnosis present

## 2018-01-27 DIAGNOSIS — Z841 Family history of disorders of kidney and ureter: Secondary | ICD-10-CM | POA: Diagnosis not present

## 2018-01-27 LAB — GLUCOSE, CAPILLARY
Glucose-Capillary: 81 mg/dL (ref 70–99)
Glucose-Capillary: 167 mg/dL — ABNORMAL HIGH (ref 70–99)

## 2018-01-27 MED ORDER — HYDROXYZINE HCL 25 MG PO TABS: 25.0000 mg | ORAL_TABLET | Freq: Three times a day (TID) | ORAL | Status: DC | PRN

## 2018-01-27 MED ORDER — ESTROGENS, CONJUGATED 0.625 MG/GM VA CREA: TOPICAL_CREAM | VAGINAL | Status: DC

## 2018-01-27 MED ORDER — NYSTATIN 100000 UNIT/GM EX POWD: Freq: Every day | CUTANEOUS | Status: DC | PRN

## 2018-01-27 MED ORDER — OLOPATADINE HCL 0.1 % OP SOLN: 1.0000 [drp] | Freq: Two times a day (BID) | OPHTHALMIC | Status: DC

## 2018-01-27 MED ORDER — INSULIN GLARGINE 100 UNIT/ML SOLOSTAR PEN: 40.0000 [IU] | PEN_INJECTOR | Freq: Two times a day (BID) | SUBCUTANEOUS | Status: DC

## 2018-01-27 MED ORDER — FLUOXETINE HCL (PMDD) 10 MG PO TABS: 10.0000 mg | ORAL_TABLET | Freq: Every day | ORAL | Status: DC

## 2018-01-27 MED ORDER — OMEPRAZOLE 40 MG PO CPDR: 40.0000 mg | DELAYED_RELEASE_CAPSULE | Freq: Two times a day (BID) | ORAL | Status: DC

## 2018-01-27 MED ORDER — BENZONATATE 200 MG PO CAPS: 200.0000 mg | ORAL_CAPSULE | Freq: Three times a day (TID) | ORAL | 0 refills | Status: DC | PRN

## 2018-01-27 NOTE — Telephone Encounter (Signed)
Transition Care Management Follow-up Telephone Call   Date discharged?   01-27-18             How have you been since you were released from the hospital? Doing better just don't feel like talking (spoke with [name] who was with [name])   Do you understand why you were in the hospital? Yes UTI, severe cold     Do you understand the discharge instructions? Yes    Where were you discharged to? Home    Items Reviewed:  Medications reviewed: Yes   Allergies reviewed: Yes   Dietary changes reviewed: Heart healthy   Referrals reviewed: Yes    Functional Questionnaire:   Activities of Daily Living (ADLs):  Needs assistance    Any transportation issues/concerns?: None    Any patient concerns? None    Confirmed importance and date/time of follow-up visits scheduled Appointment made by [facility] for Thursday 02-02-18   Confirmed with patient if condition begins to worsen call PCP or go to the ER.  Patient was given the office number and encouraged to call back with question or concerns.  :

## 2018-01-27 NOTE — Discharge Instructions (Signed)
Seek medical care or return to ER if symptoms come back, worsen or new problem develops.

## 2018-01-27 NOTE — Discharge Summary (Signed)
Physician Discharge Summary  [name] QMG:[id number] DOB: 08/14/1940 DOA: 01/24/2018  PCP: [name], MD  Admit date: 01/24/2018 Discharge date: 01/27/2018  Admitted From:  Home  Disposition: Home   Recommendations for Outpatient Follow-up:  1. Follow up with PCP in 1 weeks  Home Health: PT   Discharge Condition: STABLE   CODE STATUS: DNR    Brief Hospitalization Summary: Please see all hospital notes, images, labs for full details of the hospitalization. HPI:    [name]  is a 77 y.o. female, with history of diabetes mellitus type 2, pulmonary embolism on chronic anticoagulation with Pradaxa since 2012, dyslipidemia, hypertension, GERD came to hospital with complaints of cough, runny nose, fever, generalized body aches.  Patient has had repeated UTIs over the past few months.  Last antibiotics 2 weeks ago at that time she was prescribed ciprofloxacin.  Patient does complain of dysuria denies abdominal or flank pain.  Complains of urgency Denies shortness of breath.  Complains of chest and abdominal pain with coughing. In the ED CTA chest was done which was negative for pulmonary embolism. UA was abnormal, patient was started on IV ceftriaxone, urine culture obtained. Denies previous history of stroke or seizures. No history of CAD  Brief Narrative:  77 year old female with a history of diabetes, hypertension, pulmonary embolism on chronic anticoagulation, presents to the hospital with cough, runny nose fever and generalized body aches.  She is found to have possible acute bronchitis and is being treated supportively.  Urinalysis also indicates possible infection.  She is had repeated infection in the past few months and has had taken several courses of antibiotics.  She is currently on intravenous ceftriaxone.  Urine culture in process.  Assessment & Plan:   Active Problems:   Essential hypertension   Gastroesophageal reflux disease   Chronic  anticoagulation   Diabetes mellitus, insulin dependent (IDDM), uncontrolled (HCC)   Acute bronchitis   UTI (urinary tract infection)   History of pulmonary embolism  1. Urinary tract infection.  Patient has a history of recurrent UTIs.  Urinalysis on admission indicates possible infection.  She also complains of dysuria.  She been treated with ceftriaxone x 3 doses.  Urine culture did not grow anything and antibiotics discontinued.  2. Acute bronchitis.  Improving.  Influenza panel negative.  Continue on bronchodilators and mucolytics.  3. History of pulmonary embolism.  She is currently on Pradaxa. 4. Type 2 diabetes mellitus - Continue Lantus and sliding scale insulin.  Blood sugars are currently stable. 5. Hypertension.  Blood pressures are currently elevated.  Continue home regimen and use hydralazine as needed.  CBG (last 3)  Recent Labs    01/26/18 1700 01/26/18 2140 01/27/18 0739  GLUCAP 120* 167* 81    DVT prophylaxis: Pradaxa Code Status: DNR Family Communication: Discussed with family present at bedside Disposition Plan: Discharge home   Discharge Diagnoses:  Active Problems:   Essential hypertension   Gastroesophageal reflux disease   Chronic anticoagulation   Diabetes mellitus, insulin dependent (IDDM), uncontrolled (HCC)   Acute bronchitis   UTI (urinary tract infection)   History of pulmonary embolism  Discharge Instructions: Discharge Instructions    Call MD for:  difficulty breathing, headache or visual disturbances   Complete by:  As directed    Call MD for:  extreme fatigue   Complete by:  As directed    Call MD for:  persistant nausea and vomiting   Complete by:  As directed    Call MD  for:  severe uncontrolled pain   Complete by:  As directed    Increase activity slowly   Complete by:  As directed       Follow-up Information    [name], MD. Schedule an appointment as soon as possible for a visit in 1 week(s).   Specialty:   Family Medicine Why:  Hospital Follow Up  Contact information: [address] [place] [address] [phone]        Arnoldo Lenis, MD .   Specialty:  Cardiology Contact information: [address] [place] [address] [phone]            Medication List    STOP taking these medications   ciprofloxacin 500 MG tablet Commonly known as:  CIPRO   clotrimazole-betamethasone cream Commonly known as:  LOTRISONE   temazepam 30 MG capsule Commonly known as:  RESTORIL     TAKE these medications   ACCU-CHEK AVIVA PLUS w/Device Kit For once daily testing dx E11.9   acetaminophen 500 MG tablet Commonly known as:  TYLENOL Take 500 mg by mouth every 6 (six) hours as needed for mild pain.   acyclovir 400 MG tablet Commonly known as:  ZOVIRAX TAKE 1 TABLET (400 MG TOTAL) BY MOUTH 3 (THREE) TIMES DAILY.   amLODipine 10 MG tablet Commonly known as:  NORVASC TAKE 1 TABLET BY MOUTH EVERY DAY   atorvastatin 40 MG tablet Commonly known as:  LIPITOR TAKE 1 TABLET BY MOUTH EVERY DAY   AZO-STANDARD 95 MG tablet Generic drug:  phenazopyridine Take 95 mg by mouth 3 (three) times daily as needed for pain.   B-D UF III MINI PEN NEEDLES 31G X 5 MM Misc Generic drug:  Insulin Pen Needle USE WITH LANTUS AS DIRECTED   benzonatate 200 MG capsule Commonly known as:  TESSALON Take 1 capsule (200 mg total) by mouth 3 (three) times daily as needed for cough.   busPIRone 7.5 MG tablet Commonly known as:  BUSPAR TAKE 1 TABLET BY MOUTH 3 TIMES DAILY   cloNIDine 0.2 MG tablet Commonly known as:  CATAPRES TAKE 1 TABLET (0.2 MG TOTAL) BY MOUTH 3 (THREE) TIMES DAILY.   colchicine 0.6 MG tablet Take 1 tablet (0.6 mg total) by mouth daily. May decrease to once per day as needed for diarrhea   COMBIGAN 0.2-0.5 % ophthalmic solution Generic drug:  brimonidine-timolol Place 1 drop into both eyes 2 (two) times daily.   conjugated estrogens vaginal  cream Commonly known as:  PREMARIN Place vaginally 2 (two) times a week. Apply sparingly ( fingertip)  Twice weekly to vaginal area on Wednesdays and Fridays Start taking on:  January 30, 2018   dabigatran 150 MG Caps capsule Commonly known as:  PRADAXA TAKE ONE CAPSULE BY MOUTH EVERY 12 HOURS What changed:    how much to take  how to take this  when to take this  additional instructions   donepezil 10 MG tablet Commonly known as:  ARICEPT TAKE 1 TABLET BY MOUTH EVERYDAY AT BEDTIME What changed:  See the new instructions.   Fluoxetine HCl (PMDD) 10 MG Tabs Take 1 tablet (10 mg total) by mouth daily.   glucose blood test strip Commonly known as:  ONE TOUCH ULTRA TEST USE TO TEST TWICE DAILY   guaiFENesin 600 MG 12 hr tablet Commonly known as:  MUCINEX Take 600 mg by mouth 2 (two) times daily as needed for cough or to loosen phlegm.   hydrOXYzine 25  MG tablet Commonly known as:  ATARAX/VISTARIL Take 1 tablet (25 mg total) by mouth 3 (three) times daily as needed for itching.   imipramine 50 MG tablet Commonly known as:  TOFRANIL TAKE 2 TABLETS (100 MG TOTAL) BY MOUTH AT BEDTIME.   Insulin Glargine 100 UNIT/ML Solostar Pen Commonly known as:  LANTUS SOLOSTAR Inject 40 Units into the skin 2 (two) times daily.   montelukast 10 MG tablet Commonly known as:  SINGULAIR TAKE 1 TABLET BY MOUTH AT BEDTIME   nystatin powder Commonly known as:  MYCOSTATIN/NYSTOP Apply topically daily as needed (for rash under breasts).   olopatadine 0.1 % ophthalmic solution Commonly known as:  PATANOL Place 1 drop into both eyes 2 (two) times daily. What changed:  See the new instructions.   omeprazole 40 MG capsule Commonly known as:  PRILOSEC Take 1 capsule (40 mg total) by mouth 2 (two) times daily.   ONETOUCH DELICA LANCETS 93G Misc USE TO TEST ONCE DAILY   oxyCODONE-acetaminophen 10-325 MG tablet Commonly known as:  PERCOCET Take one tablet two times daily for back  pain What changed:    how much to take  how to take this  when to take this  additional instructions  Another medication with the same name was removed. Continue taking this medication, and follow the directions you see here.   SUMAtriptan 5 MG/ACT nasal spray Commonly known as:  IMITREX Place 1 spray into the nose every 2 (two) hours as needed for migraine.   topiramate 100 MG tablet Commonly known as:  TOPAMAX TAKE 1 TABLET BY MOUTH TWICE A DAY   VENTOLIN HFA 108 (90 Base) MCG/ACT inhaler Generic drug:  albuterol INHALE 2 PUFFS INTO THE LUNGS EVERY 6 (SIX) HOURS AS NEEDED FOR WHEEZING OR SHORTNESS OF BREATH.   Vitamin D (Ergocalciferol) 1.25 MG (50000 UT) Caps capsule Commonly known as:  DRISDOL TAKE 1 CAPSULE (50,000 UNITS TOTAL) BY MOUTH ONCE A WEEK. What changed:  See the new instructions.       Procedures/Studies: Dg Chest 2 View  Result Date: 01/24/2018 CLINICAL DATA:  77 year old female with cough and fever. EXAM: CHEST - 2 VIEW COMPARISON:  Chest radiograph dated 06/20/2017 FINDINGS: The heart size and mediastinal contours are within normal limits. Both lungs are clear. The visualized skeletal structures are unremarkable. IMPRESSION: No active cardiopulmonary disease. Electronically Signed   By: Anner Crete M.D.   On: 01/24/2018 21:21   Ct Angio Chest Pe W And/or Wo Contrast  Result Date: 01/24/2018 CLINICAL DATA:  Cough, chest congestion and myalgias for the past week. Chest pain with inspiration. EXAM: CT ANGIOGRAPHY CHEST WITH CONTRAST TECHNIQUE: Multidetector CT imaging of the chest was performed using the standard protocol during bolus administration of intravenous contrast. Multiplanar CT image reconstructions and MIPs were obtained to evaluate the vascular anatomy. CONTRAST:  138m ISOVUE-370 IOPAMIDOL (ISOVUE-370) INJECTION 76% COMPARISON:  Chest radiographs obtained earlier today. Chest CTA dated 07/16/2010 FINDINGS: Cardiovascular: Normally opacified  pulmonary arteries with no pulmonary arterial filling defects seen. Mildly enlarged heart. Mediastinum/Nodes: No enlarged mediastinal, hilar, or axillary lymph nodes. Thyroid gland, trachea, and esophagus demonstrate no significant findings. Lungs/Pleura: Lungs are clear. No pleural effusion or pneumothorax. Upper Abdomen: Unremarkable. Musculoskeletal: Thoracic spine degenerative changes. Review of the MIP images confirms the above findings. IMPRESSION: No pulmonary emboli or acute abnormality. Electronically Signed   By: SClaudie ReveringM.D.   On: 01/24/2018 22:54      Subjective: Pt says she feels much better today and feels  well enough to go home.   Discharge Exam: Vitals:   01/27/18 0735 01/27/18 0738  BP:    Pulse:    Resp:    Temp:    SpO2: 95% 95%   Vitals:   01/26/18 2132 01/27/18 0629 01/27/18 0735 01/27/18 0738  BP: (!) 137/58 123/70    Pulse: 85 77    Resp: 16 17    Temp: (!) 97.5 F (36.4 C) (!) 97.4 F (36.3 C)    TempSrc: Oral Oral    SpO2: 96% 97% 95% 95%  Weight:      Height:      General exam: Appears calm and comfortable. NAD.  Respiratory system: BBS with occasional exp wheeze. Respiratory effort normal. Cardiovascular system: S1 & S2 heard, RRR. No JVD, murmurs, rubs, gallops or clicks. No pedal edema. Gastrointestinal system: Abdomen is nondistended, soft and nontender. No organomegaly or masses felt. Normal bowel sounds heard. Central nervous system: Alert and oriented. No focal neurological deficits. Extremities: Symmetric 5 x 5 power. Skin: No rashes, lesions or ulcers Psychiatry: Judgement and insight appear normal. Mood & affect appropriate.  The results of significant diagnostics from this hospitalization (including imaging, microbiology, ancillary and laboratory) are listed below for reference.     Microbiology: Recent Results (from the past 240 hour(s))  Urine culture     Status: Abnormal   Collection Time: 01/24/18  8:37 PM  Result Value Ref  Range Status   Specimen Description   Final    URINE, CLEAN CATCH Performed at [facility], [address] [place], [place] [address]    Special Requests   Final    Normal Performed at [facility], [address], [place], [place] [address]    Culture MULTIPLE SPECIES PRESENT, SUGGEST RECOLLECTION (A)  Final   Report Status 01/26/2018 FINAL  Final     Labs: BNP (last 3 results) Recent Labs    01/24/18 2035  BNP 66.2   Basic Metabolic Panel: Recent Labs  Lab 01/24/18 2035 01/25/18 0513 01/26/18 0500  NA 136 137 138  K 3.7 3.5 4.0  CL 103 105 109  CO2 _0 GLUCOSE 285* 202* 151*  BUN _1 CREATININE 1.25* 1.10* 1.17*  CALCIUM 9.1 8.7* 8.9   Liver Function Tests: Recent Labs  Lab 01/24/18 2035 01/25/18 0513  AST 26 21  ALT 31 26  ALKPHOS 96 83  BILITOT 0.4 0.4  PROT 7.6 6.8  ALBUMIN 3.4* 3.2*   No results for input(s): LIPASE, AMYLASE in the last 168 hours. No results for input(s): AMMONIA in the last 168 hours. CBC: Recent Labs  Lab 01/24/18 2035 01/25/18 0513 01/26/18 0500  WBC 10.8* 10.6* 9.3  NEUTROABS 7.2  --   --   HGB 9.2* 8.6* 8.9*  HCT 31.3* 29.4* 30.8*  MCV 84.1 84.5 87.5  PLT 467* 424* 420*   Cardiac Enzymes: Recent Labs  Lab 01/24/18 2035  TROPONINI <0.03   BNP: Invalid input(s): POCBNP CBG: Recent Labs  Lab 01/26/18 0724 01/26/18 1143 01/26/18 1700 01/26/18 2140 01/27/18 0739  GLUCAP 108* 128* 120* 167* 81   D-Dimer No results for input(s): DDIMER in the last 72 hours. Hgb A1c Recent Labs    01/24/18 2035  HGBA1C 7.9*   Lipid Profile No results for input(s): CHOL, HDL, LDLCALC, TRIG, CHOLHDL, LDLDIRECT in the last 72 hours. Thyroid function studies No results for input(s): TSH, T4TOTAL, T3FREE, THYROIDAB in the last 72 hours.  Invalid input(s): FREET3 Anemia work  up No results for input(s): VITAMINB12, FOLATE, FERRITIN, TIBC, IRON, RETICCTPCT in the last 72 hours. Urinalysis    Component Value  Date/Time   COLORURINE ORANGE (A) 01/24/2018 2037   APPEARANCEUR CLOUDY (A) 01/24/2018 2037   LABSPEC 1.025 01/24/2018 2037   PHURINE 5.5 01/24/2018 2037   GLUCOSEU 250 (A) 01/24/2018 2037   HGBUR MODERATE (A) 01/24/2018 2037   HGBUR negative 01/06/2010 0929   BILIRUBINUR NEGATIVE 01/24/2018 2037   BILIRUBINUR moderate 11/17/2016 1438   [place] 01/24/2018 2037   PROTEINUR >300 (A) 01/24/2018 2037   UROBILINOGEN >=8.0 (A) 11/17/2016 1438   UROBILINOGEN 0.2 10/12/2011 1556   NITRITE POSITIVE (A) 01/24/2018 2037   LEUKOCYTESUR SMALL (A) 01/24/2018 2037   Sepsis Labs Invalid input(s): PROCALCITONIN,  WBC,  LACTICIDVEN Microbiology Recent Results (from the past 240 hour(s))  Urine culture     Status: Abnormal   Collection Time: 01/24/18  8:37 PM  Result Value Ref Range Status   Specimen Description   Final    URINE, CLEAN CATCH Performed at [facility], [address], [place], [place] [address]    Special Requests   Final    Normal Performed at [facility], [address], [place], [place] [address]    [place] (A)  Final   Report Status 01/26/2018 FINAL  Final   Time coordinating discharge: 33 minutes   SIGNED:  Irwin Brakeman, MD  [facility] 01/27/2018, 9:29 AM Pager [phone]  If 7PM-7AM, please contact night-coverage [email or web address] Password [email or web address]

## 2018-01-30 ENCOUNTER — Other Ambulatory Visit: Payer: Self-pay

## 2018-01-30 ENCOUNTER — Encounter: Payer: Self-pay | Admitting: Family Medicine

## 2018-01-30 ENCOUNTER — Ambulatory Visit (INDEPENDENT_AMBULATORY_CARE_PROVIDER_SITE_OTHER): Payer: PPO | Admitting: Family Medicine

## 2018-01-30 VITALS — BP 150/80 | HR 98 | Resp 16 | Ht 67.0 in | Wt 237.1 lb

## 2018-01-30 DIAGNOSIS — Z09 Encounter for follow-up examination after completed treatment for conditions other than malignant neoplasm: Secondary | ICD-10-CM

## 2018-01-30 MED ORDER — BENZONATATE 100 MG PO CAPS: 100.0000 mg | ORAL_CAPSULE | Freq: Two times a day (BID) | ORAL | 1 refills | Status: DC | PRN

## 2018-01-30 NOTE — Patient Instructions (Signed)
F/U in January for physical as before, call if you need me sooner   Tessalon perles are prescribed for cough.  Drink, water regularly and empty often.   Cranberry juice and D mannose can be used in small amounts to help to reduce frequency of uTI

## 2018-01-30 NOTE — Patient Outreach (Signed)
Unsuccessful telephone call to [name] today.  I left a HIPPA compliant voicemail on the listed home phone.    Plan: I will follow-up with Ms. Lukasik in 3-4 business days regarding post discharge medication review.   Isaias Sakai, Sherian Rein D PGY1 Pharmacy Resident  01/30/2018      11:31 AM

## 2018-01-31 ENCOUNTER — Telehealth: Payer: Self-pay | Admitting: Family Medicine

## 2018-01-31 NOTE — Telephone Encounter (Signed)
[facility] is calling-Pt refused services, called [name] to see if she was aware, and she was.

## 2018-01-31 NOTE — Telephone Encounter (Signed)
noted 

## 2018-02-01 ENCOUNTER — Ambulatory Visit: Payer: Self-pay

## 2018-02-01 ENCOUNTER — Other Ambulatory Visit: Payer: Self-pay

## 2018-02-01 ENCOUNTER — Other Ambulatory Visit: Payer: Self-pay | Admitting: Family Medicine

## 2018-02-01 NOTE — Patient Outreach (Signed)
[place] [facility]) Care Management  02/01/2018  LADANA CHAVERO [birth year] [id number]  Successful outreach call to Ms. Gharibian's sister, Ms. [name].  Sister states she is out and requests that I call her back at noon.   Unsuccessful outreach attempt at noon today.  Voice mail box was full, so I could not leave a message.   Plan: Outreach attempt in 3-4 business days.  Joetta Manners, PharmD Clinical Pharmacist [place] [phone]

## 2018-02-02 ENCOUNTER — Ambulatory Visit: Payer: Self-pay | Admitting: Family Medicine

## 2018-02-03 ENCOUNTER — Encounter: Payer: Self-pay | Admitting: Family Medicine

## 2018-02-03 NOTE — Progress Notes (Signed)
   [name]     MRN: [id number]      DOB: 24-Apr-1940   HPI Ms. Piggee is here for follow up of recent hospitalization from 12/10 to 01/27/2018 for possible bronchitis and possible UTI. Of note, urine did not show infection, and antibiotics were discontinued and her CXR negative for infection Her hospital course and reports are reviewed with her sister and grand niece The PT denies any adverse reactions to current medications since the last visit.  C/o excess cough and chest congestion, and requests refill on tessalon perle, this is done ROS Denies recent fever or chills. Denies sinus pressure, nasal congestion, ear pain or sore throat. Denies chest congestion,  or wheezing. Denies chest pains, palpitations and leg swelling Denies abdominal pain, nausea, vomiting,diarrhea or constipation.   Denies dysuria, frequency, hesitancy or incontinence. C/o chronic  joint pain, swelling and limitation in mobility. Denies headaches, seizures, numbness, or tingling. Denies depression, anxiety or insomnia. Denies skin break down or rash.   PE  BP (!) 150/80 (BP Location: Left Arm, Patient Position: Sitting, Cuff Size: Large)   Pulse 98   Resp 16   Ht 5\' 7"  (1.702 m)   Wt 237 lb 1.9 oz (107.6 kg)   SpO2 95% Comment: room air  BMI 37.14 kg/m   Patient alert and oriented and in no cardiopulmonary distress.  HEENT: No facial asymmetry, EOMI,   oropharynx pink and moist.  Neck supple no JVD, no mass.  Chest: Clear to auscultation bilaterally.  CVS: S1, S2 no murmurs, no S3.Regular rate.  ABD: Soft non tender.   Ext: No edema  [id number]  ROM spine, shoulders, hips and knees.  Skin: Intact, no ulcerations or rash noted.  Psych: Good eye contact, normal affect. Memory impaired not anxious or depressed appearing.  CNS: CN 2-12 intact, power,  normal throughout.no focal deficits noted.   [facility] discharge follow-up Hospital course reviewed with patient and her  family members. Discussed the need to void often and ensure adequate water intake to reduce risk of recurrent UTI

## 2018-02-03 NOTE — Assessment & Plan Note (Signed)
Hospital course reviewed with patient and her family members. Discussed the need to void often and ensure adequate water intake to reduce risk of recurrent UTI

## 2018-02-06 ENCOUNTER — Other Ambulatory Visit: Payer: Self-pay

## 2018-02-06 ENCOUNTER — Ambulatory Visit: Payer: Self-pay

## 2018-02-06 NOTE — Patient Outreach (Signed)
[facility]) Care Management  [place]  02/06/2018  KHALIAH BARNICK 08-24-1940 [id number]   Reason for call: 30 day post discharge med review  Unsuccessful telephone call attempt # 2 to patient.    HIPAA compliant voicemail left requesting a return call.  Plan:  I will make another outreach attempt next week.   Joetta Manners, PharmD [place] [phone]

## 2018-02-13 ENCOUNTER — Ambulatory Visit: Payer: Self-pay

## 2018-02-13 ENCOUNTER — Other Ambulatory Visit: Payer: Self-pay | Admitting: Cardiology

## 2018-02-13 ENCOUNTER — Other Ambulatory Visit: Payer: Self-pay | Admitting: Pharmacy Technician

## 2018-02-13 ENCOUNTER — Other Ambulatory Visit: Payer: Self-pay | Admitting: Family Medicine

## 2018-02-13 ENCOUNTER — Other Ambulatory Visit: Payer: Self-pay

## 2018-02-13 NOTE — Patient Outreach (Addendum)
[facility]) Care Management  [place]   02/13/2018  CELESTE TAVENNER May 22, 1940 [id number]  Reason for referral: Medication Reconciliation Post Discharge  Current insurance:[employer], but will change to [facility] on 02/15/18  PMHx includes but not limited to: migraine, h/o pulmonary embolism, hypertension, GERD, diabetes mellitus and dyslipidemia  Outreach:  Successful telephone call with Ms. Rueb's sister Ms. Linster and her daugter Ms. Nathaneil Canary.  HIPAA identifiers verified.   Subjective:  Ms. Beacher May states that Ms. Ehrlich lives with her daughter, Elberta Leatherwood and that she (Ms. Beacher May) assists with getting Ms. Loor to her doctor's appointments.  She reports that her sister checks CBGs twice daily and that her HgA1c is much improved.  She reports that  Ms. Tomeo has difficulty affording her Pradax and her Lantus.  She has seen her PCP post discharge and has an appointment on 03/15/18.  She also sees Urology for her recurrent UTIs.   Objective: Lab Results  Component Value Date   CREATININE 1.17 (H) 01/26/2018   CREATININE 1.10 (H) 01/25/2018   CREATININE 1.25 (H) 01/24/2018    Lab Results  Component Value Date   HGBA1C 7.9 (H) 01/24/2018    Lipid Panel     Component Value Date/Time   CHOL 154 12/21/2017 1202   TRIG 135 12/21/2017 1202   HDL 60 12/21/2017 1202   CHOLHDL 2.6 12/21/2017 1202   VLDL 21 08/15/2016 0550   LDLCALC 72 12/21/2017 1202    BP Readings from Last 3 Encounters:  01/30/18 (!) 150/80  01/27/18 123/70  01/05/18 (!) 158/80   Allergies: PCN-hives Sulfonamides-hives Codeine-itching/rash Fentanyl-itching   Medications Reviewed Today    Reviewed by Dionne Milo, [place] (Pharmacist) on 02/13/18 at [id number]  Med List Status: <None>  Medication Order Taking? Sig Documenting Provider Last Dose Status Informant  acetaminophen (TYLENOL) 500 MG tablet [id number] Yes Take 500 mg by mouth every 6 (six) hours as needed for mild  pain. [provider] Taking Active Family Member  acyclovir (ZOVIRAX) 400 MG tablet [id number] Yes TAKE 1 TABLET (400 MG TOTAL) BY MOUTH 3 (THREE) TIMES DAILY. [name], MD Taking Active Family Member  amLODipine (NORVASC) 10 MG tablet [id number] Yes TAKE 1 TABLET BY MOUTH EVERY DAY  Patient taking differently:  Take 10 mg by mouth daily.    [name], MD Taking Active Family Member  atorvastatin (LIPITOR) 40 MG tablet [id number] Yes TAKE 1 TABLET BY MOUTH EVERY DAY  Patient taking differently:  Take 40 mg by mouth daily.    [name], MD Taking Active Family Member  B-D UF III MINI PEN NEEDLES 31G X 5 MM MISC [id number] Yes USE WITH LANTUS AS DIRECTED [name], MD Taking Active Family Member  benzonatate (TESSALON) 100 MG capsule [id number] Yes Take 1 capsule (100 mg total) by mouth 2 (two) times daily as needed for cough. [name], MD Taking Active   Blood Glucose Monitoring Suppl (ACCU-CHEK AVIVA PLUS) w/Device Drucie Opitz [id number] Yes For once daily testing dx E11.9 [name], MD Taking Active Family Member  busPIRone (BUSPAR) 7.5 MG tablet [id number] Yes TAKE 1 TABLET BY MOUTH 3 TIMES DAILY  Patient taking differently:  Take 7.5 mg by mouth 3 (three) times daily.    [name], MD Taking Active Family Member  cloNIDine (CATAPRES) 0.2 MG tablet [id number] Yes TAKE 1 TABLET (0.2 MG TOTAL) BY MOUTH 3 (THREE) TIMES DAILY. [name], MD Taking Active Family Member  colchicine 0.6 MG tablet [id number] Yes Take 1 tablet (0.6 mg total) by mouth daily. May decrease to once per day as needed for diarrhea Tanna Furry, MD Taking Active Family Member  COMBIGAN 0.2-0.5 % ophthalmic solution [id number] Yes Place 1 drop into both eyes 2 (two) times daily. [provider] Taking Active Family Member  conjugated estrogens (PREMARIN) vaginal cream [id number] Yes Place vaginally 2 (two) times a week. Apply sparingly ( fingertip)   Twice weekly to vaginal area on Wednesdays and Fridays Murlean Iba, MD Taking Active   dabigatran (PRADAXA) 150 MG CAPS capsule [id number] Yes Take 1 capsule (150 mg total) by mouth every 12 (twelve) hours. Arnoldo Lenis, MD Taking Active   donepezil (ARICEPT) 10 MG tablet [id number] Yes TAKE 1 TABLET BY MOUTH EVERYDAY AT BEDTIME [name], MD Taking Active   Fluoxetine HCl, PMDD, 10 MG TABS [id number] Yes Take 1 tablet (10 mg total) by mouth daily. Murlean Iba, MD Taking Active   glucose blood (ONE TOUCH ULTRA TEST) test strip [id number] Yes USE TO TEST TWICE DAILY [name], MD Taking Active Family Member  guaiFENesin (MUCINEX) 600 MG 12 hr tablet [id number] Yes Take 600 mg by mouth 2 (two) times daily as needed for cough or to loosen phlegm. [provider] Taking Active Family Member  hydrOXYzine (ATARAX/VISTARIL) 25 MG tablet [id number]  Take 1 tablet (25 mg total) by mouth 3 (three) times daily as needed for itching. Johnson, Clanford L, MD  Active   imipramine (TOFRANIL) 50 MG tablet [id number] Yes TAKE 2 TABLETS (100 MG TOTAL) BY MOUTH AT BEDTIME. [name], MD Taking Active Family Member  Insulin Glargine (LANTUS SOLOSTAR) 100 UNIT/ML Solostar Pen [id number] Yes Inject 40 Units into the skin 2 (two) times daily. Murlean Iba, MD Taking Active   meclizine (ANTIVERT) 25 MG tablet [id number] Yes TAKE 1 TABLET THREE TIMES DAILY AS NEEDED FOR DIZZINESS. [name], MD Taking Active   montelukast (SINGULAIR) 10 MG tablet [id number] Yes TAKE 1 TABLET BY MOUTH AT BEDTIME  Patient taking differently:  Take 10 mg by mouth at bedtime.    [name], MD Taking Active Family Member  nystatin (MYCOSTATIN/NYSTOP) powder [id number] Yes Apply topically daily as needed (for rash under breasts). Johnson, Clanford L, MD Taking Active   olopatadine (PATANOL) 0.1 % ophthalmic solution [id number] Yes Place 1 drop into both eyes 2 (two) times  daily. Murlean Iba, MD Taking Active   omeprazole (PRILOSEC) 40 MG capsule [id number] Yes Take 1 capsule (40 mg total) by mouth 2 (two) times daily. Murlean Iba, MD Taking Active   [facility] LANCETS 20F MISC [id number] Yes USE TO TEST ONCE DAILY [name], MD Taking Active Family Member  phenazopyridine (AZO-STANDARD) 95 MG tablet [id number] Yes Take 95 mg by mouth 3 (three) times daily as needed for pain. [provider] Taking Active Family Member  SUMAtriptan (IMITREX) 5 MG/ACT nasal spray [id number] Yes Place 1 spray into the nose every 2 (two) hours as needed for migraine.  [provider] Taking Active Family Member  topiramate (TOPAMAX) 100 MG tablet [id number] Yes TAKE 1 TABLET BY MOUTH TWICE A DAY  Patient taking differently:  Take 100 mg by mouth 2 (two) times daily.    [name], MD Taking Active Family Member  VENTOLIN HFA [phone] Base) MCG/ACT inhaler [id number] Yes INHALE 2 PUFFS INTO THE LUNGS EVERY 6 (SIX) HOURS AS NEEDED FOR WHEEZING OR SHORTNESS OF  BREATH. [name], MD Taking Active Family Member  Vitamin D, Ergocalciferol, (DRISDOL) 50000 units CAPS capsule [id number] Yes TAKE 1 CAPSULE (50,000 UNITS TOTAL) BY MOUTH ONCE A WEEK.  Patient taking differently:  Take 50,000 Units by mouth every 7 (seven) days. Friday   [name], MD Taking Active Family Member         ASSESSMENT: Date Discharged from Hospital: 01/27/18 Date Medication Reconciliation Performed: 02/13/2018  Discontinued at Discharge:   Ciprofloxacin  Clotrimazole/betamethasone  Temazepam  Patient was recently discharged from hospital and all medications have been reviewed.  Assessment:  Drugs sorted by system:  Neurologic/Psychologic: buspirone, donepezil, fluoxetine, imipramine, sumatriptan,topiramate  Cardiovascular: amlodipine, atorvastatin, clonidine, dabigatran, hydroxyzine,   Pulmonary/Allergy: benzonatate, guaifenesin,  montelukast, albuterol MDI  Gastrointestinal: omeprazole  Topical: Combigan ophthalmic, nystatin powder, olopatadine   Pain: acetaminophen, oxycodone/APAP  Infectious Diseases: acyclovir  Genitourinary: conjugated estrogen vaginal, phenazopyridine  Vitamins/Minerals/Supplements: ergocalciferol  Miscellaneous: colchicine, meclizine   Medication Review Findings:   The combination of a statin and colchicine may result in an increased potential (greater than for each agent alone) for myopathy.  Per the [employer], meclizine and hydroxyzine is highly anticholinergic and clearance is reduced with advanced age. Risk of confusion, dry mouth, constipation and other anticholinergic effects or toxicity may occur.  There is strong evidence to avoid use in the elderly.  Per the [employer] List, Imipramine is highly anticholinergic,sedating and can cause orthostatic hypotension.  There is strong recommendation to avoid use in the elderly.  Acyclovir- Unsure why she is on this medication.  Medication Assistance Findings:  Medication assistance needs identified.  Extra Help:   []  Already receiving Full Extra Help  []  Already receiving Partial Extra Help  []  Eligible based on reported income and assets  [x]  Not Eligible based on reported income and assets  Patient Assistance Programs: 1) Pradaxa made by [employer] o Income requirement met: [x]  Yes []  No []  Unknown - Out-of-pocket prescription expenditure met:    []  Yes []  No  []  Unknown  [x]  Not applicable  - Patient is eligible to apply for assistance at this time.        2)  Lantus made by [employer] o Income requirement met: [x]  Yes []  No  []  Unknown o Out-of-pocket prescription expenditure met:   []  Yes [x]  No   []  Unknown []  Not applicable - Patient has not met application requirements to apply for this patient assistance program at this time.   Will need to meet out of pocket expenditure for 2020, which is 2% of her income.    Plan: I will route patient assistance letter to [place] technician who will coordinate patient assistance program application process for medications listed above.  [facility] pharmacy technician will assist with obtaining all required documents from both patient and provider(s) and submit application(s) once completed.    Route note to PCP, Dr. Moshe Cipro.  Joetta Manners, PharmD Clinical Pharmacist [place] [phone]

## 2018-02-13 NOTE — Patient Outreach (Signed)
[facility]) Care Management  02/13/2018  [name] Jun 21, 1940 [id number]                                                  Medication Assistance Referral  Referral From: [facility] Clearnce Sorrel.  Medication/Company: Pradaxa / [employer] Patient application portion:  Mailed Provider application portion: Faxed  to Dr. Harl Bowie   Follow up:  Will follow up with patient in 5-7 business days to confirm application(s) have been received.  [name] Certified Pharmacy Technician [place] Management Direct Dial:[phone]

## 2018-02-24 ENCOUNTER — Emergency Department (HOSPITAL_COMMUNITY): Payer: Medicare HMO

## 2018-02-24 ENCOUNTER — Encounter (HOSPITAL_COMMUNITY): Payer: Self-pay | Admitting: Emergency Medicine

## 2018-02-24 ENCOUNTER — Other Ambulatory Visit: Payer: Self-pay

## 2018-02-24 ENCOUNTER — Inpatient Hospital Stay (HOSPITAL_COMMUNITY)
Admission: EM | Admit: 2018-02-24 | Discharge: 2018-02-27 | DRG: 661 | Disposition: A | Discharge status: Home / Self Care | Payer: Medicare HMO | Attending: Family Medicine | Admitting: Family Medicine

## 2018-02-24 DIAGNOSIS — K219 Gastro-esophageal reflux disease without esophagitis: Secondary | ICD-10-CM | POA: Diagnosis present

## 2018-02-24 DIAGNOSIS — K5900 Constipation, unspecified: Secondary | ICD-10-CM | POA: Diagnosis present

## 2018-02-24 DIAGNOSIS — Z86711 Personal history of pulmonary embolism: Secondary | ICD-10-CM | POA: Diagnosis present

## 2018-02-24 DIAGNOSIS — R319 Hematuria, unspecified: Secondary | ICD-10-CM | POA: Diagnosis present

## 2018-02-24 DIAGNOSIS — B9620 Unspecified Escherichia coli [E. coli] as the cause of diseases classified elsewhere: Secondary | ICD-10-CM | POA: Diagnosis present

## 2018-02-24 DIAGNOSIS — I4891 Unspecified atrial fibrillation: Secondary | ICD-10-CM | POA: Diagnosis present

## 2018-02-24 DIAGNOSIS — N3001 Acute cystitis with hematuria: Secondary | ICD-10-CM

## 2018-02-24 DIAGNOSIS — A419 Sepsis, unspecified organism: Secondary | ICD-10-CM | POA: Diagnosis not present

## 2018-02-24 DIAGNOSIS — N952 Postmenopausal atrophic vaginitis: Secondary | ICD-10-CM | POA: Diagnosis present

## 2018-02-24 DIAGNOSIS — N390 Urinary tract infection, site not specified: Secondary | ICD-10-CM

## 2018-02-24 DIAGNOSIS — R000 Tachycardia, unspecified: Secondary | ICD-10-CM | POA: Diagnosis not present

## 2018-02-24 DIAGNOSIS — N3289 Other specified disorders of bladder: Secondary | ICD-10-CM | POA: Diagnosis present

## 2018-02-24 DIAGNOSIS — I10 Essential (primary) hypertension: Secondary | ICD-10-CM | POA: Diagnosis present

## 2018-02-24 DIAGNOSIS — Z79899 Other long term (current) drug therapy: Secondary | ICD-10-CM

## 2018-02-24 DIAGNOSIS — D509 Iron deficiency anemia, unspecified: Secondary | ICD-10-CM | POA: Diagnosis present

## 2018-02-24 DIAGNOSIS — N201 Calculus of ureter: Secondary | ICD-10-CM

## 2018-02-24 DIAGNOSIS — N1330 Unspecified hydronephrosis: Secondary | ICD-10-CM | POA: Diagnosis not present

## 2018-02-24 DIAGNOSIS — H409 Unspecified glaucoma: Secondary | ICD-10-CM | POA: Diagnosis present

## 2018-02-24 DIAGNOSIS — E119 Type 2 diabetes mellitus without complications: Secondary | ICD-10-CM | POA: Diagnosis not present

## 2018-02-24 DIAGNOSIS — R509 Fever, unspecified: Secondary | ICD-10-CM | POA: Diagnosis not present

## 2018-02-24 DIAGNOSIS — Z794 Long term (current) use of insulin: Secondary | ICD-10-CM

## 2018-02-24 DIAGNOSIS — E1165 Type 2 diabetes mellitus with hyperglycemia: Secondary | ICD-10-CM | POA: Diagnosis present

## 2018-02-24 DIAGNOSIS — N132 Hydronephrosis with renal and ureteral calculous obstruction: Secondary | ICD-10-CM | POA: Diagnosis not present

## 2018-02-24 DIAGNOSIS — Z96652 Presence of left artificial knee joint: Secondary | ICD-10-CM | POA: Diagnosis present

## 2018-02-24 DIAGNOSIS — Z66 Do not resuscitate: Secondary | ICD-10-CM | POA: Diagnosis present

## 2018-02-24 DIAGNOSIS — N136 Pyonephrosis: Secondary | ICD-10-CM | POA: Diagnosis present

## 2018-02-24 DIAGNOSIS — Z87440 Personal history of urinary (tract) infections: Secondary | ICD-10-CM | POA: Diagnosis not present

## 2018-02-24 DIAGNOSIS — R05 Cough: Secondary | ICD-10-CM | POA: Diagnosis not present

## 2018-02-24 DIAGNOSIS — M109 Gout, unspecified: Secondary | ICD-10-CM | POA: Diagnosis present

## 2018-02-24 DIAGNOSIS — E785 Hyperlipidemia, unspecified: Secondary | ICD-10-CM | POA: Diagnosis present

## 2018-02-24 DIAGNOSIS — Z79890 Hormone replacement therapy (postmenopausal): Secondary | ICD-10-CM

## 2018-02-24 DIAGNOSIS — F329 Major depressive disorder, single episode, unspecified: Secondary | ICD-10-CM | POA: Diagnosis present

## 2018-02-24 DIAGNOSIS — G4700 Insomnia, unspecified: Secondary | ICD-10-CM | POA: Diagnosis present

## 2018-02-24 DIAGNOSIS — N3000 Acute cystitis without hematuria: Secondary | ICD-10-CM | POA: Diagnosis not present

## 2018-02-24 DIAGNOSIS — N3010 Interstitial cystitis (chronic) without hematuria: Secondary | ICD-10-CM | POA: Diagnosis not present

## 2018-02-24 DIAGNOSIS — R079 Chest pain, unspecified: Secondary | ICD-10-CM | POA: Diagnosis not present

## 2018-02-24 DIAGNOSIS — N202 Calculus of kidney with calculus of ureter: Secondary | ICD-10-CM | POA: Diagnosis not present

## 2018-02-24 DIAGNOSIS — IMO0001 Reserved for inherently not codable concepts without codable children: Secondary | ICD-10-CM

## 2018-02-24 LAB — URINALYSIS, ROUTINE W REFLEX MICROSCOPIC
BILIRUBIN URINE: NEGATIVE
Glucose, UA: NEGATIVE mg/dL
Ketones, ur: NEGATIVE mg/dL
Nitrite: NEGATIVE
Protein, ur: 100 mg/dL — AB
RBC / HPF: >50 RBC/hpf — ABNORMAL HIGH (ref 0–5)
Specific Gravity, Urine: 1.025 (ref 1.005–1.030)
WBC, UA: >50 WBC/hpf — ABNORMAL HIGH (ref 0–5)
pH: 5 (ref 5.0–8.0)

## 2018-02-24 LAB — CBC WITH DIFFERENTIAL/PLATELET
Abs Immature Granulocytes: 0.04 10*3/uL (ref 0.00–0.07)
Basophils Absolute: 0 10*3/uL (ref 0.0–0.1)
Basophils Relative: 0 %
Eosinophils Absolute: 0 10*3/uL (ref 0.0–0.5)
Eosinophils Relative: 0 %
HCT: 30.5 % — ABNORMAL LOW (ref 36.0–46.0)
Hemoglobin: 8.8 g/dL — ABNORMAL LOW (ref 12.0–15.0)
IMMATURE GRANULOCYTES: 0 %
Lymphocytes Relative: 15 %
Lymphs Abs: 1.9 10*3/uL (ref 0.7–4.0)
MCH: 24.1 pg — ABNORMAL LOW (ref 26.0–34.0)
MCHC: 28.9 g/dL — ABNORMAL LOW (ref 30.0–36.0)
MCV: 83.6 fL (ref 80.0–100.0)
Monocytes Absolute: 1.2 10*3/uL — ABNORMAL HIGH (ref 0.1–1.0)
Monocytes Relative: 9 %
NEUTROS PCT: 76 %
NRBC: 0 % (ref 0.0–0.2)
Neutro Abs: 9.7 10*3/uL — ABNORMAL HIGH (ref 1.7–7.7)
Platelets: 427 10*3/uL — ABNORMAL HIGH (ref 150–400)
RBC: 3.65 MIL/uL — ABNORMAL LOW (ref 3.87–5.11)
RDW: 15.1 % (ref 11.5–15.5)
WBC: 12.8 10*3/uL — ABNORMAL HIGH (ref 4.0–10.5)

## 2018-02-24 LAB — COMPREHENSIVE METABOLIC PANEL
ALBUMIN: 3.2 g/dL — AB (ref 3.5–5.0)
ALT: 13 U/L (ref 0–44)
AST: 13 U/L — AB (ref 15–41)
Alkaline Phosphatase: 84 U/L (ref 38–126)
Anion gap: 9 (ref 5–15)
BUN: 18 mg/dL (ref 8–23)
CHLORIDE: 105 mmol/L (ref 98–111)
CO2: 21 mmol/L — ABNORMAL LOW (ref 22–32)
Calcium: 8.6 mg/dL — ABNORMAL LOW (ref 8.9–10.3)
Creatinine, Ser: 1.29 mg/dL — ABNORMAL HIGH (ref 0.44–1.00)
GFR calc Af Amer: 46 mL/min — ABNORMAL LOW (ref >60)
GFR calc non Af Amer: 40 mL/min — ABNORMAL LOW (ref >60)
Glucose, Bld: 189 mg/dL — ABNORMAL HIGH (ref 70–99)
Potassium: 4.4 mmol/L (ref 3.5–5.1)
Sodium: 135 mmol/L (ref 135–145)
Total Bilirubin: 0.2 mg/dL — ABNORMAL LOW (ref 0.3–1.2)
Total Protein: 6.9 g/dL (ref 6.5–8.1)

## 2018-02-24 LAB — LIPASE, BLOOD: Lipase: 19 U/L (ref 11–51)

## 2018-02-24 MED ORDER — IOPAMIDOL (ISOVUE-300) INJECTION 61%
80.0000 mL | Freq: Once | INTRAVENOUS | Status: AC | PRN
  Administered 2018-02-25: 80 mL via INTRAVENOUS

## 2018-02-24 MED ORDER — SODIUM CHLORIDE 0.9 % IV SOLN
1.0000 g | Freq: Once | INTRAVENOUS | Status: AC
  Administered 2018-02-25: 1 g via INTRAVENOUS
  Filled 2018-02-24: qty 10

## 2018-02-24 MED ORDER — SODIUM CHLORIDE 0.9 % IV BOLUS
500.0000 mL | Freq: Once | INTRAVENOUS | Status: AC
  Administered 2018-02-25: 500 mL via INTRAVENOUS

## 2018-02-24 NOTE — ED Notes (Signed)
Pt reports she takes am and pm BP meds, has not taken her pm meds today

## 2018-02-24 NOTE — ED Triage Notes (Signed)
Pt reports hematuria and dysuria since last hospital admission here on 12/1012020. Pt reports suprapubic pain for "a long time" and fever started today. Highest reported at home was 100.8, tylenol given at 7pm tonight.

## 2018-02-24 NOTE — ED Provider Notes (Addendum)
[facility] EMERGENCY DEPARTMENT Provider Note   CSN: [id number] Arrival date & time: 02/24/18  2114     History   Chief Complaint Chief Complaint  Patient presents with  . Hematuria    fever    HPI [name] is a 78 y.o. female who presents with fever and hematuria. PMH significant for anemia, A-fib, DM, HTN, and prior PE on Pradaxa.  The patient's sister and niece are at bedside. They state that the patient has been having chronic hematuria and suprapubic pain after being diagnosed with a UTI back in May. She will have some days without hematuria but it always comes back and "looks like a period". Tonight she was confused and weak so they took her temperature and it was 100.8. They gave her Tylenol and called EMS who helped get her in to their car so they could bring her here. She lives with her daughter and her husband. The patient states she feels "bad". She has a headache, intermittent chest pain and SOB, as well as suprapubic pain. She was hospitalist last month for similar symptoms and ultimately they felt she did not have a UTI and she was treated for bronchitis. They followed up with Urology who told them that she was having hematuria due to recurrent UTI.  HPI  Past Medical History:  Diagnosis Date  . Anemia, iron deficiency 2012   Evaluated by Dr. Oneida Alar; H&H of 9.3/30.8 with [place] in 10/2010; 3/3 positive Hemoccult cards in 07/2011  . Anxiety    was on Prozac for a couple of weeks  . Atrial fibrillation ([place])    takes Coumadin daily  . Bruises easily    takes Coumadin  . Chronic anticoagulation 07/21/2010  . Chronic back pain    related to knee pain  . Degenerative joint disease    Knees  . Depression   . Diabetes mellitus    takes Metformin daily  . Gastroesophageal reflux disease    takes Omeprazole daily  . Glaucoma   . Glaucoma    uses eye drops at night  . History of blood transfusion 1969  . History of gout    was on medication but taken off of  several months ago  . Hx of colonic polyps   . Hx of migraines    last one about a yr ago;takes Topamax daily  . Hyperlipidemia    takes Pravastatin daily  . Hypertension    takes Hyzaar daily and Propranolol   . Insomnia    takes Restoril prn  . Joint pain   . Joint swelling   . Memory loss    takes donepezil  . Migraine   . Migraine   . Nocturia   . Overweight(278.02)   . Pneumonia 1969   hx of  . Pulmonary embolism [facility]) May 2012   acute presentation, bilateral PE  . Skin spots-aging   . Urinary frequency     Patient Active Problem List   Diagnosis Date Noted  . History of pulmonary embolism 01/25/2018  . Hospital discharge follow-up 08/25/2016  . 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 Encounter for chronic pain management 07/17/2016  . Chronic pain syndrome 02/03/2016  . Bilateral knee pain 04/23/2014  . Diabetes mellitus, insulin dependent (IDDM), uncontrolled ([place]) 11/15/2013  . Right-sided low back pain with right-sided sciatica 07/02/2013  . Seasonal allergies 04/11/2013  . Urinary incontinence 04/11/2013  . Metabolic syndrome X 93/81/0175  . Chronic anticoagulation 12/22/2012  . Vitamin D deficiency 11/05/2012  . Memory  loss 11/01/2012  . Depression with anxiety 11/01/2012  . Anemia, iron deficiency   . Pruritus 09/03/2009  . Morbid obesity ([place]) 08/16/2008  . Dyslipidemia 05/30/2007  . Migraine 05/30/2007  . Essential hypertension 05/30/2007  . Gastroesophageal reflux disease 05/30/2007    Past Surgical History:  Procedure Laterality Date  . CATARACT EXTRACTION W/PHACO Right 04/11/2012   Procedure: CATARACT EXTRACTION PHACO AND INTRAOCULAR LENS PLACEMENT (IOC);  Surgeon: Elta Guadeloupe T. Gershon Crane, MD;  Location: [facility] ORS;  Service: Ophthalmology;  Laterality: Right;  CDE=9.61  . CATARACT EXTRACTION W/PHACO Left 12/26/2012   Procedure: CATARACT EXTRACTION PHACO AND INTRAOCULAR LENS PLACEMENT (IOC);  Surgeon: Elta Guadeloupe T. Gershon Crane, MD;  Location: [facility];  Service: Ophthalmology;   Laterality: Left;  CDE:7.74  . COLONOSCOPY  Jan 2002; 2012   2002: Dr. Tamala Julian, ext. hemorrhoids, nl colon; 2012-adenomatous polyps, gastritis on EGD  . DILATION AND CURETTAGE OF UTERUS    . ESOPHAGOGASTRODUODENOSCOPY  08/24/2011   ESOPHAGOGASTRODUODENOSCOPY; esophageal dilatation; Rogene Houston, MD;  . Freda Munro CAPSULE STUDY  08/18/2011   Procedure: GIVENS CAPSULE STUDY;  Surgeon: Rogene Houston, MD;  Location: [facility] END[facility] SUITE;  Service: Endoscopy;  Laterality: N/A;  730  . KNEE ARTHROSCOPY W/ MENISCECTOMY  90's   Left  . ORIF ANKLE FRACTURE Right 90's  . TONSILLECTOMY    . TOTAL KNEE ARTHROPLASTY  10/20/2011   Procedure: TOTAL KNEE ARTHROPLASTY;  Surgeon: Ninetta Lights, MD;  Location: [facility];  Service: Orthopedics;  Laterality: Left;  . UPPER GASTROINTESTINAL ENDOSCOPY    . URETHRAL DILATION       OB History   No obstetric history on file.      Home Medications    Prior to Admission medications   Medication Sig Start Date End Date Taking? Authorizing Provider  acetaminophen (TYLENOL) 500 MG tablet Take 500 mg by mouth every 6 (six) hours as needed for mild pain.    [provider]  acyclovir (ZOVIRAX) 400 MG tablet TAKE 1 TABLET (400 MG TOTAL) BY MOUTH 3 (THREE) TIMES DAILY. 09/12/17   [name], MD  amLODipine (NORVASC) 10 MG tablet TAKE 1 TABLET BY MOUTH EVERY DAY Patient taking differently: Take 10 mg by mouth daily.  07/12/17   [name], MD  atorvastatin (LIPITOR) 40 MG tablet TAKE 1 TABLET BY MOUTH EVERY DAY Patient taking differently: Take 40 mg by mouth daily.  09/26/17   [name], MD  B-D UF III MINI PEN NEEDLES 31G X 5 MM MISC USE WITH LANTUS AS DIRECTED 08/08/17   [name], MD  benzonatate (TESSALON) 100 MG capsule Take 1 capsule (100 mg total) by mouth 2 (two) times daily as needed for cough. 01/30/18   [name], MD  Blood Glucose Monitoring Suppl (ACCU-CHEK AVIVA PLUS) w/Device KIT For once daily testing dx E11.9  11/24/15   [name], MD  busPIRone (BUSPAR) 7.5 MG tablet TAKE 1 TABLET BY MOUTH 3 TIMES DAILY Patient taking differently: Take 7.5 mg by mouth 3 (three) times daily.  12/23/17   [name], MD  cloNIDine (CATAPRES) 0.2 MG tablet TAKE 1 TABLET (0.2 MG TOTAL) BY MOUTH 3 (THREE) TIMES DAILY. 10/04/17   [name], MD  colchicine 0.6 MG tablet Take 1 tablet (0.6 mg total) by mouth daily. May decrease to once per day as needed for diarrhea 06/27/17   Tanna Furry, MD  COMBIGAN 0.2-0.5 % ophthalmic solution Place 1 drop into both eyes 2 (two) times daily. 03/23/17  [provider]  conjugated estrogens (PREMARIN) vaginal cream Place vaginally 2 (two) times a week. Apply sparingly ( fingertip)  Twice weekly to vaginal area on Wednesdays and Fridays 01/30/18   Murlean Iba, MD  dabigatran (PRADAXA) 150 MG CAPS capsule Take 1 capsule (150 mg total) by mouth every 12 (twelve) hours. 02/13/18   Arnoldo Lenis, MD  donepezil (ARICEPT) 10 MG tablet TAKE 1 TABLET BY MOUTH EVERYDAY AT BEDTIME 02/13/18   [name], MD  Fluoxetine HCl, PMDD, 10 MG TABS Take 1 tablet (10 mg total) by mouth daily. 01/27/18   Johnson, Clanford L, MD  glucose blood (ONE TOUCH ULTRA TEST) test strip USE TO TEST TWICE DAILY 12/26/17   [name], MD  guaiFENesin (MUCINEX) 600 MG 12 hr tablet Take 600 mg by mouth 2 (two) times daily as needed for cough or to loosen phlegm.    [provider]  hydrOXYzine (ATARAX/VISTARIL) 25 MG tablet Take 1 tablet (25 mg total) by mouth 3 (three) times daily as needed for itching. 01/27/18   Johnson, Clanford L, MD  imipramine (TOFRANIL) 50 MG tablet TAKE 2 TABLETS (100 MG TOTAL) BY MOUTH AT BEDTIME. 07/12/17   [name], MD  Insulin Glargine (LANTUS SOLOSTAR) 100 UNIT/ML Solostar Pen Inject 40 Units into the skin 2 (two) times daily. 01/27/18   Johnson, Clanford L, MD  meclizine (ANTIVERT) 25 MG tablet TAKE 1 TABLET THREE  TIMES DAILY AS NEEDED FOR DIZZINESS. 02/01/18   [name], MD  montelukast (SINGULAIR) 10 MG tablet TAKE 1 TABLET BY MOUTH AT BEDTIME Patient taking differently: Take 10 mg by mouth at bedtime.  08/23/17   [name], MD  nystatin (MYCOSTATIN/NYSTOP) powder Apply topically daily as needed (for rash under breasts). 01/27/18   Johnson, Clanford L, MD  olopatadine (PATANOL) 0.1 % ophthalmic solution Place 1 drop into both eyes 2 (two) times daily. 01/27/18   Johnson, Clanford L, MD  omeprazole (PRILOSEC) 40 MG capsule Take 1 capsule (40 mg total) by mouth 2 (two) times daily. 01/27/18   Murlean Iba, MD  ONETOUCH DELICA LANCETS 78G MISC USE TO TEST ONCE DAILY 10/14/17   [name], MD  oxyCODONE-acetaminophen (PERCOCET) 10-325 MG tablet Take 1 tablet by mouth 2 (two) times daily as needed for pain (back pain and arthritis).    [provider]  phenazopyridine (AZO-STANDARD) 95 MG tablet Take 95 mg by mouth 3 (three) times daily as needed for pain.    [provider]  SUMAtriptan (IMITREX) 5 MG/ACT nasal spray Place 1 spray into the nose every 2 (two) hours as needed for migraine.  01/16/17   [provider]  topiramate (TOPAMAX) 100 MG tablet TAKE 1 TABLET BY MOUTH TWICE A DAY Patient taking differently: Take 100 mg by mouth 2 (two) times daily.  09/12/17   [name], MD  VENTOLIN HFA 108 (90 Base) MCG/ACT inhaler INHALE 2 PUFFS INTO THE LUNGS EVERY 6 (SIX) HOURS AS NEEDED FOR WHEEZING OR SHORTNESS OF BREATH. 05/26/16   [name], MD  Vitamin D, Ergocalciferol, (DRISDOL) 50000 units CAPS capsule TAKE 1 CAPSULE (50,000 UNITS TOTAL) BY MOUTH ONCE A WEEK. Patient taking differently: Take 50,000 Units by mouth every 7 (seven) days. Friday 11/18/17   [name], MD    Family History Family History  Problem Relation Age of Onset  . Diabetes Mother   . Hypertension Mother   . Arthritis Mother   . Heart failure  Mother     . Migraines Mother   . Kidney failure Mother   . Leukemia Father   . Migraines Father   . Kidney failure Father   . Diabetes Sister   . Hypertension Sister   . Diabetes Brother   . Hypertension Brother   . Hypertension Brother   . Hypertension Brother   . Hypertension Brother   . Diabetes Brother   . Diabetes Brother   . Diabetes Brother   . Pulmonary embolism Sister   . Migraines Sister   . Kidney failure Brother   . Colon cancer Neg Hx   . Colon polyps Neg Hx     Social History Social History   Tobacco Use  . Smoking status: Never Smoker  . Smokeless tobacco: Never Used  Substance Use Topics  . Alcohol use: No  . Drug use: No     Allergies   Penicillins; Fentanyl; Sulfonamide derivatives; and Codeine   Review of Systems Review of Systems  Constitutional: Positive for fever.  Respiratory: Positive for cough (improving) and shortness of breath.   Cardiovascular: Positive for chest pain.  Gastrointestinal: Positive for abdominal pain. Negative for constipation, diarrhea, nausea and vomiting.  Genitourinary: Positive for dysuria, frequency, hematuria and pelvic pain.  Musculoskeletal: Positive for back pain (chronic).  Neurological: Positive for weakness.  All other systems reviewed and are negative.    Physical Exam Updated Vital Signs BP (!) 151/139 (BP Location: Left Arm)   Pulse (!) 116   Temp 98.9 F (37.2 C) (Oral)   Resp 20   Ht 5' 7"  (1.702 m)   Wt 102.5 kg   SpO2 98%   BMI 35.40 kg/m   Physical Exam Vitals signs and nursing note reviewed.  Constitutional:      General: She is not in acute distress.    Appearance: She is well-developed. She is obese.     Comments: Calm, cooperative. Fatigued appearing  HENT:     Head: Normocephalic and atraumatic.  Eyes:     General: No scleral icterus.       Right eye: No discharge.        Left eye: No discharge.     Conjunctiva/sclera: Conjunctivae normal.     Pupils: Pupils are equal, round, and  reactive to light.  Neck:     Musculoskeletal: Normal range of motion.  Cardiovascular:     Rate and Rhythm: Regular rhythm. Tachycardia present.  Pulmonary:     Effort: Pulmonary effort is normal. No respiratory distress.     Breath sounds: Wheezing (mild scattered expiratory) present.  Abdominal:     General: There is no distension.     Palpations: Abdomen is soft.     Tenderness: There is abdominal tenderness (suprapubic). There is no guarding.  Musculoskeletal:     Comments: Trace bilateral edema  Skin:    General: Skin is warm and dry.  Neurological:     Mental Status: She is alert and oriented to person, place, and time.  Psychiatric:        Mood and Affect: Mood normal.        Behavior: Behavior normal.      ED Treatments / Results  Labs (all labs ordered are listed, but only abnormal results are displayed) Labs Reviewed  URINALYSIS, ROUTINE W REFLEX MICROSCOPIC - Abnormal; Notable for the following components:      Result Value   APPearance TURBID (*)    Hgb urine dipstick LARGE (*)    Protein, ur  100 (*)    Leukocytes, UA MODERATE (*)    RBC / HPF >50 (*)    WBC, UA >50 (*)    Bacteria, UA RARE (*)    All other components within normal limits  CBC WITH DIFFERENTIAL/PLATELET - Abnormal; Notable for the following components:   WBC 12.8 (*)    RBC 3.65 (*)    Hemoglobin 8.8 (*)    HCT 30.5 (*)    MCH 24.1 (*)    MCHC 28.9 (*)    Platelets 427 (*)    Neutro Abs 9.7 (*)    Monocytes Absolute 1.2 (*)    All other components within normal limits  COMPREHENSIVE METABOLIC PANEL - Abnormal; Notable for the following components:   CO2 21 (*)    Glucose, Bld 189 (*)    Creatinine, Ser 1.29 (*)    Calcium 8.6 (*)    Albumin 3.2 (*)    AST 13 (*)    Total Bilirubin 0.2 (*)    GFR calc non Af Amer 40 (*)    GFR calc Af Amer 46 (*)    All other components within normal limits  URINE CULTURE  LIPASE, BLOOD    EKG EKG Interpretation  Date/Time:  Friday  February 24 2018 23:07:16 EST Ventricular Rate:  99 PR Interval:    QRS Duration: 101 QT Interval:  326 QTC Calculation: 419 R Axis:   21 Text Interpretation:  Sinus rhythm Abnormal R-wave progression, early transition Probable left ventricular hypertrophy Baseline wander in lead(s) aVF Confirmed by Nat Christen [phone]) on 02/24/2018 11:22:14 PM   Radiology Dg Chest 2 View  Result Date: 02/25/2018 CLINICAL DATA:  Cough and fever EXAM: CHEST - 2 VIEW COMPARISON:  01/24/2018, CT 01/24/2018 FINDINGS: Borderline to mild cardiomegaly with aortic atherosclerosis. No acute consolidation or effusion. No pneumothorax. IMPRESSION: No active cardiopulmonary disease.  Borderline to mild cardiomegaly. Electronically Signed   By: Donavan Foil M.D.   On: 02/25/2018 00:21   Ct Abdomen Pelvis W Contrast  Result Date: 02/25/2018 CLINICAL DATA:  78 y/o  F; hematuria, dysuria, fever. EXAM: CT ABDOMEN AND PELVIS WITH CONTRAST TECHNIQUE: Multidetector CT imaging of the abdomen and pelvis was performed using the standard protocol following bolus administration of intravenous contrast. CONTRAST:  54m ISOVUE-300 IOPAMIDOL (ISOVUE-300) INJECTION 61% COMPARISON:  07/17/2010 CT abdomen and pelvis FINDINGS: Lower chest: No acute abnormality. Hepatobiliary: No focal liver abnormality is seen. No gallstones, gallbladder wall thickening, or biliary dilatation. Pancreas: Unremarkable. No pancreatic ductal dilatation or surrounding inflammatory changes. Spleen: Normal in size without focal abnormality. Adrenals/Urinary Tract: Normal adrenal glands. 4 mm right kidney lower pole stone. Multiple bilateral renal cysts measuring up to 19 mm in the upper pole of left kidney. No right hydronephrosis. Mild left hydronephrosis with 3 mm stone in the distal left ureter in left hemipelvis (series 5, image 82). Normal bladder. Stomach/Bowel: Stomach is within normal limits. Appendix appears normal. No evidence of bowel wall thickening,  distention, or inflammatory changes. Mild sigmoid diverticulosis. Vascular/Lymphatic: No significant vascular findings are present. No enlarged abdominal or pelvic lymph nodes. Reproductive: Uterus and bilateral adnexa are unremarkable. Other: No abdominal wall hernia or abnormality. No abdominopelvic ascites. Musculoskeletal: Lumbar spondylosis with advanced facet arthropathy greatest at L4-5 where there is grade 1 anterolisthesis. No acute osseous abnormality identified. IMPRESSION: 1. Mild left hydronephrosis with 3 mm stone in distal left ureter in left hemipelvis. 2. Right kidney lower pole 4 mm nonobstructing stone. 3. Mild sigmoid diverticulosis. 4. Lumbar spondylosis with  advanced facet arthropathy greatest at L4-5 where there is grade 1 anterolisthesis. Electronically Signed   By: Kristine Garbe M.D.   On: 02/25/2018 00:43    Procedures Procedures (including critical care time)  CRITICAL CARE Performed by: Recardo Evangelist   Total critical care time: 35 minutes  Critical care time was exclusive of separately billable procedures and treating other patients.  Critical care was necessary to treat or prevent imminent or life-threatening deterioration.  Critical care was time spent personally by me on the following activities: development of treatment plan with patient and/or surrogate as well as nursing, discussions with consultants, evaluation of patient's response to treatment, examination of patient, obtaining history from patient or surrogate, ordering and performing treatments and interventions, ordering and review of laboratory studies, ordering and review of radiographic studies, pulse oximetry and re-evaluation of patient's condition.   Medications Ordered in ED Medications  sodium chloride 0.9 % bolus 500 mL (500 mLs Intravenous New Bag/Given 02/25/18 0037)  iopamidol (ISOVUE-300) 61 % injection 80 mL (80 mLs Intravenous Contrast Given 02/25/18 0023)  cefTRIAXone  (ROCEPHIN) 1 g in sodium chloride 0.9 % 100 mL IVPB (1 g Intravenous New Bag/Given 02/25/18 0039)     Initial Impression / Assessment and Plan / ED Course  I have reviewed the triage vital signs and the nursing notes.  Pertinent labs & imaging results that were available during my care of the patient were reviewed by me and considered in my medical decision making (see chart for details).  78 year old female presents with fever, abdominal pain, confusion/weakness, and hematuria with dysuria. She is hypertensive but otherwise vitals are normal. On exam she appears debilitated. Heart is regular rate and rhythm. Lungs are CTA. Abdomen is soft and she is tender in the suprapubic area. Will obtain labs, UA, CXR, EKG, and CT abdomen/pelvis since I do not see one performed recently.   CBC is remarkable for leukocytosis (12.8). Hgb has been slowly trending down (8.8 today). CMP is remarkable for slightly higher SCr (1.29). UA once again appears consistent with infection. There is large hgb, moderate leukocytes, rare bacteria, >50 RBC, >50 WBC. Urine culture was sent. Fluids and Rocephin ordered. CXR is negative. EKG is SR.  CT is remarkable for left sided hydronephrosis and 66m stone in distal ureter. Shared visit with Dr. WChristy Gentles Will discuss with urology.  Spoke with Dr. EJunious Silk He recommends admission for fluids, antibiotics and observation. [facility] Care transferred to Dr. WChristy Gentleswho will dispo.  Final Clinical Impressions(s) / ED Diagnoses   Final diagnoses:  Left ureteral stone  Acute cystitis with hematuria    ED Discharge Orders    None       GRecardo Evangelist PA-C 02/25/18 0110    GRecardo Evangelist PA-C 02/25/18 0111    WRipley Fraise MD 02/25/18 [id number]

## 2018-02-25 ENCOUNTER — Inpatient Hospital Stay (HOSPITAL_COMMUNITY): Payer: Medicare HMO | Admitting: Certified Registered Nurse Anesthetist

## 2018-02-25 ENCOUNTER — Inpatient Hospital Stay (HOSPITAL_COMMUNITY): Payer: Medicare HMO

## 2018-02-25 ENCOUNTER — Encounter (HOSPITAL_COMMUNITY): Payer: Self-pay | Admitting: *Deleted

## 2018-02-25 ENCOUNTER — Encounter (HOSPITAL_COMMUNITY)
Admission: EM | Disposition: A | Discharge status: Home / Self Care | Payer: Self-pay | Source: Home / Self Care | Attending: Family Medicine

## 2018-02-25 DIAGNOSIS — E785 Hyperlipidemia, unspecified: Secondary | ICD-10-CM | POA: Diagnosis present

## 2018-02-25 DIAGNOSIS — I10 Essential (primary) hypertension: Secondary | ICD-10-CM | POA: Diagnosis present

## 2018-02-25 DIAGNOSIS — Z87440 Personal history of urinary (tract) infections: Secondary | ICD-10-CM | POA: Diagnosis not present

## 2018-02-25 DIAGNOSIS — Z86711 Personal history of pulmonary embolism: Secondary | ICD-10-CM | POA: Diagnosis not present

## 2018-02-25 DIAGNOSIS — E1165 Type 2 diabetes mellitus with hyperglycemia: Secondary | ICD-10-CM | POA: Diagnosis present

## 2018-02-25 DIAGNOSIS — F329 Major depressive disorder, single episode, unspecified: Secondary | ICD-10-CM | POA: Diagnosis present

## 2018-02-25 DIAGNOSIS — B9620 Unspecified Escherichia coli [E. coli] as the cause of diseases classified elsewhere: Secondary | ICD-10-CM | POA: Diagnosis present

## 2018-02-25 DIAGNOSIS — N3001 Acute cystitis with hematuria: Secondary | ICD-10-CM | POA: Diagnosis not present

## 2018-02-25 DIAGNOSIS — M109 Gout, unspecified: Secondary | ICD-10-CM | POA: Diagnosis present

## 2018-02-25 DIAGNOSIS — Z79890 Hormone replacement therapy (postmenopausal): Secondary | ICD-10-CM | POA: Diagnosis not present

## 2018-02-25 DIAGNOSIS — R319 Hematuria, unspecified: Secondary | ICD-10-CM | POA: Diagnosis not present

## 2018-02-25 DIAGNOSIS — D509 Iron deficiency anemia, unspecified: Secondary | ICD-10-CM | POA: Diagnosis present

## 2018-02-25 DIAGNOSIS — I4891 Unspecified atrial fibrillation: Secondary | ICD-10-CM | POA: Diagnosis present

## 2018-02-25 DIAGNOSIS — N136 Pyonephrosis: Secondary | ICD-10-CM | POA: Diagnosis present

## 2018-02-25 DIAGNOSIS — N952 Postmenopausal atrophic vaginitis: Secondary | ICD-10-CM | POA: Diagnosis present

## 2018-02-25 DIAGNOSIS — G4700 Insomnia, unspecified: Secondary | ICD-10-CM | POA: Diagnosis present

## 2018-02-25 DIAGNOSIS — K5900 Constipation, unspecified: Secondary | ICD-10-CM | POA: Diagnosis present

## 2018-02-25 DIAGNOSIS — Z794 Long term (current) use of insulin: Secondary | ICD-10-CM | POA: Diagnosis not present

## 2018-02-25 DIAGNOSIS — N201 Calculus of ureter: Secondary | ICD-10-CM | POA: Diagnosis not present

## 2018-02-25 DIAGNOSIS — H409 Unspecified glaucoma: Secondary | ICD-10-CM | POA: Diagnosis present

## 2018-02-25 DIAGNOSIS — Z66 Do not resuscitate: Secondary | ICD-10-CM | POA: Diagnosis present

## 2018-02-25 DIAGNOSIS — Z79899 Other long term (current) drug therapy: Secondary | ICD-10-CM | POA: Diagnosis not present

## 2018-02-25 DIAGNOSIS — Z96652 Presence of left artificial knee joint: Secondary | ICD-10-CM | POA: Diagnosis present

## 2018-02-25 DIAGNOSIS — K219 Gastro-esophageal reflux disease without esophagitis: Secondary | ICD-10-CM | POA: Diagnosis present

## 2018-02-25 DIAGNOSIS — N3289 Other specified disorders of bladder: Secondary | ICD-10-CM | POA: Diagnosis present

## 2018-02-25 HISTORY — PX: CYSTOSCOPY W/ URETERAL STENT PLACEMENT: SHX1429

## 2018-02-25 LAB — SURGICAL PCR SCREEN
MRSA, PCR: NEGATIVE
Staphylococcus aureus: NEGATIVE

## 2018-02-25 LAB — COMPREHENSIVE METABOLIC PANEL
ALT: 11 U/L (ref 0–44)
AST: 13 U/L — ABNORMAL LOW (ref 15–41)
Albumin: 3.3 g/dL — ABNORMAL LOW (ref 3.5–5.0)
Alkaline Phosphatase: 80 U/L (ref 38–126)
Anion gap: 9 (ref 5–15)
BUN: 19 mg/dL (ref 8–23)
CHLORIDE: 106 mmol/L (ref 98–111)
CO2: 23 mmol/L (ref 22–32)
Calcium: 8.5 mg/dL — ABNORMAL LOW (ref 8.9–10.3)
Creatinine, Ser: 1.19 mg/dL — ABNORMAL HIGH (ref 0.44–1.00)
GFR calc Af Amer: 51 mL/min — ABNORMAL LOW (ref >60)
GFR calc non Af Amer: 44 mL/min — ABNORMAL LOW (ref >60)
Glucose, Bld: 143 mg/dL — ABNORMAL HIGH (ref 70–99)
Potassium: 4.1 mmol/L (ref 3.5–5.1)
Sodium: 138 mmol/L (ref 135–145)
Total Bilirubin: 0.7 mg/dL (ref 0.3–1.2)
Total Protein: 7.3 g/dL (ref 6.5–8.1)

## 2018-02-25 LAB — GLUCOSE, CAPILLARY
GLUCOSE-CAPILLARY: 130 mg/dL — AB (ref 70–99)
Glucose-Capillary: 153 mg/dL — ABNORMAL HIGH (ref 70–99)
Glucose-Capillary: 123 mg/dL — ABNORMAL HIGH (ref 70–99)
Glucose-Capillary: 127 mg/dL — ABNORMAL HIGH (ref 70–99)
Glucose-Capillary: 148 mg/dL — ABNORMAL HIGH (ref 70–99)

## 2018-02-25 LAB — CBC
HCT: 29.8 % — ABNORMAL LOW (ref 36.0–46.0)
Hemoglobin: 8.6 g/dL — ABNORMAL LOW (ref 12.0–15.0)
MCH: 24.5 pg — ABNORMAL LOW (ref 26.0–34.0)
MCHC: 28.9 g/dL — ABNORMAL LOW (ref 30.0–36.0)
MCV: 84.9 fL (ref 80.0–100.0)
Platelets: 415 10*3/uL — ABNORMAL HIGH (ref 150–400)
RBC: 3.51 MIL/uL — ABNORMAL LOW (ref 3.87–5.11)
RDW: 15.2 % (ref 11.5–15.5)
WBC: 13.8 10*3/uL — ABNORMAL HIGH (ref 4.0–10.5)
nRBC: 0 % (ref 0.0–0.2)

## 2018-02-25 LAB — HEMOGLOBIN A1C
Hgb A1c MFr Bld: 8.1 % — ABNORMAL HIGH (ref 4.8–5.6)
Mean Plasma Glucose: 185.77 mg/dL

## 2018-02-25 SURGERY — CYSTOSCOPY, WITH RETROGRADE PYELOGRAM AND URETERAL STENT INSERTION
Anesthesia: General | Site: Ureter | Laterality: Bilateral

## 2018-02-25 MED ORDER — BRIMONIDINE TARTRATE-TIMOLOL 0.2-0.5 % OP SOLN: 1.0000 [drp] | Freq: Two times a day (BID) | OPHTHALMIC | Status: DC

## 2018-02-25 MED ORDER — SODIUM CHLORIDE 0.9 % IV SOLN
INTRAVENOUS | Status: DC | PRN
  Administered 2018-02-25: 30 ug/min via INTRAVENOUS

## 2018-02-25 MED ORDER — FENTANYL CITRATE (PF) 100 MCG/2ML IJ SOLN
25.0000 ug | INTRAMUSCULAR | Status: DC | PRN
  Administered 2018-02-25: 25 ug via INTRAVENOUS
  Administered 2018-02-25: 50 ug via INTRAVENOUS
  Administered 2018-02-25: 25 ug via INTRAVENOUS

## 2018-02-25 MED ORDER — SODIUM CHLORIDE 0.9 % IV SOLN
INTRAVENOUS | Status: DC
  Administered 2018-02-25 – 2018-02-27 (×6): via INTRAVENOUS

## 2018-02-25 MED ORDER — ALBUTEROL SULFATE (2.5 MG/3ML) 0.083% IN NEBU: 2.5000 mg | INHALATION_SOLUTION | Freq: Four times a day (QID) | RESPIRATORY_TRACT | Status: DC | PRN

## 2018-02-25 MED ORDER — AMLODIPINE BESYLATE 10 MG PO TABS
10.0000 mg | ORAL_TABLET | Freq: Every day | ORAL | Status: DC
  Administered 2018-02-25 – 2018-02-27 (×3): 10 mg via ORAL
  Filled 2018-02-25 (×3): qty 1

## 2018-02-25 MED ORDER — METOPROLOL TARTRATE 5 MG/5ML IV SOLN
INTRAVENOUS | Status: AC
  Filled 2018-02-25: qty 5

## 2018-02-25 MED ORDER — SODIUM CHLORIDE 0.9 % IV SOLN
1.0000 g | Freq: Once | INTRAVENOUS | Status: AC
  Administered 2018-02-25: 1 g via INTRAVENOUS

## 2018-02-25 MED ORDER — ONDANSETRON HCL 4 MG/2ML IJ SOLN: 4.0000 mg | Freq: Four times a day (QID) | INTRAMUSCULAR | Status: DC | PRN

## 2018-02-25 MED ORDER — PANTOPRAZOLE SODIUM 40 MG PO TBEC
40.0000 mg | DELAYED_RELEASE_TABLET | Freq: Every day | ORAL | Status: DC
  Administered 2018-02-25 – 2018-02-27 (×3): 40 mg via ORAL
  Filled 2018-02-25 (×3): qty 1

## 2018-02-25 MED ORDER — MORPHINE SULFATE (PF) 2 MG/ML IV SOLN
2.0000 mg | Freq: Once | INTRAVENOUS | Status: AC
  Administered 2018-02-25: 2 mg via INTRAVENOUS
  Filled 2018-02-25: qty 1

## 2018-02-25 MED ORDER — COLCHICINE 0.6 MG PO TABS
0.6000 mg | ORAL_TABLET | Freq: Every day | ORAL | Status: DC
  Administered 2018-02-25 – 2018-02-27 (×3): 0.6 mg via ORAL
  Filled 2018-02-25 (×3): qty 1

## 2018-02-25 MED ORDER — ONDANSETRON HCL 4 MG PO TABS: 4.0000 mg | ORAL_TABLET | Freq: Four times a day (QID) | ORAL | Status: DC | PRN

## 2018-02-25 MED ORDER — HYDROXYZINE HCL 25 MG PO TABS: 25.0000 mg | ORAL_TABLET | Freq: Three times a day (TID) | ORAL | Status: DC | PRN

## 2018-02-25 MED ORDER — INSULIN ASPART 100 UNIT/ML ~~LOC~~ SOLN
0.0000 [IU] | Freq: Four times a day (QID) | SUBCUTANEOUS | Status: DC
  Administered 2018-02-25: 1 [IU] via SUBCUTANEOUS
  Administered 2018-02-25: 2 [IU] via SUBCUTANEOUS
  Administered 2018-02-25 – 2018-02-26 (×2): 1 [IU] via SUBCUTANEOUS

## 2018-02-25 MED ORDER — ONDANSETRON HCL 4 MG/2ML IJ SOLN
INTRAMUSCULAR | Status: DC | PRN
  Administered 2018-02-25: 4 mg via INTRAVENOUS

## 2018-02-25 MED ORDER — BRIMONIDINE TARTRATE 0.2 % OP SOLN
1.0000 [drp] | Freq: Two times a day (BID) | OPHTHALMIC | Status: DC
  Administered 2018-02-25 – 2018-02-27 (×4): 1 [drp] via OPHTHALMIC
  Filled 2018-02-25: qty 5

## 2018-02-25 MED ORDER — STERILE WATER FOR IRRIGATION IR SOLN
Status: DC | PRN
  Administered 2018-02-25 (×2): 3000 mL

## 2018-02-25 MED ORDER — IMIPRAMINE HCL 50 MG PO TABS
100.0000 mg | ORAL_TABLET | Freq: Every day | ORAL | Status: DC
  Administered 2018-02-25 – 2018-02-26 (×2): 100 mg via ORAL
  Filled 2018-02-25 (×2): qty 2

## 2018-02-25 MED ORDER — ALBUTEROL SULFATE HFA 108 (90 BASE) MCG/ACT IN AERS: 2.0000 | INHALATION_SPRAY | Freq: Four times a day (QID) | RESPIRATORY_TRACT | Status: DC | PRN

## 2018-02-25 MED ORDER — SUCCINYLCHOLINE CHLORIDE 200 MG/10ML IV SOSY
PREFILLED_SYRINGE | INTRAVENOUS | Status: DC | PRN
  Administered 2018-02-25: 120 mg via INTRAVENOUS

## 2018-02-25 MED ORDER — FENTANYL CITRATE (PF) 100 MCG/2ML IJ SOLN
INTRAMUSCULAR | Status: AC
  Filled 2018-02-25: qty 2

## 2018-02-25 MED ORDER — ESMOLOL HCL 100 MG/10ML IV SOLN
INTRAVENOUS | Status: AC
  Filled 2018-02-25: qty 10

## 2018-02-25 MED ORDER — ONDANSETRON HCL 4 MG/2ML IJ SOLN: 4.0000 mg | Freq: Once | INTRAMUSCULAR | Status: DC | PRN

## 2018-02-25 MED ORDER — FLUOXETINE HCL (PMDD) 10 MG PO TABS: 10.0000 mg | ORAL_TABLET | Freq: Every day | ORAL | Status: DC

## 2018-02-25 MED ORDER — MORPHINE SULFATE (PF) 2 MG/ML IV SOLN
2.0000 mg | INTRAVENOUS | Status: DC | PRN
  Administered 2018-02-25 – 2018-02-26 (×4): 2 mg via INTRAVENOUS
  Filled 2018-02-25 (×4): qty 1

## 2018-02-25 MED ORDER — ESMOLOL HCL 100 MG/10ML IV SOLN
INTRAVENOUS | Status: DC | PRN
  Administered 2018-02-25 (×3): 20 mg via INTRAVENOUS

## 2018-02-25 MED ORDER — MORPHINE SULFATE (PF) 2 MG/ML IV SOLN
2.0000 mg | Freq: Once | INTRAVENOUS | Status: AC
  Administered 2018-02-25: 2 mg via INTRAVENOUS

## 2018-02-25 MED ORDER — OXYCODONE HCL 5 MG PO TABS
5.0000 mg | ORAL_TABLET | ORAL | Status: DC | PRN
  Administered 2018-02-25 – 2018-02-27 (×8): 5 mg via ORAL
  Filled 2018-02-25 (×8): qty 1

## 2018-02-25 MED ORDER — SODIUM CHLORIDE 0.9 % IV SOLN
1.0000 g | INTRAVENOUS | Status: DC
  Administered 2018-02-25 – 2018-02-26 (×2): 1 g via INTRAVENOUS
  Filled 2018-02-25 (×2): qty 1
  Filled 2018-02-25: qty 10

## 2018-02-25 MED ORDER — LIDOCAINE 2% (20 MG/ML) 5 ML SYRINGE
INTRAMUSCULAR | Status: AC
  Filled 2018-02-25: qty 5

## 2018-02-25 MED ORDER — INSULIN GLARGINE 100 UNIT/ML ~~LOC~~ SOLN
40.0000 [IU] | Freq: Two times a day (BID) | SUBCUTANEOUS | Status: DC
  Administered 2018-02-25 – 2018-02-27 (×4): 40 [IU] via SUBCUTANEOUS
  Filled 2018-02-25 (×6): qty 0.4

## 2018-02-25 MED ORDER — ACETAMINOPHEN 500 MG PO TABS: 500.0000 mg | ORAL_TABLET | Freq: Four times a day (QID) | ORAL | Status: DC | PRN

## 2018-02-25 MED ORDER — PROPOFOL 10 MG/ML IV BOLUS
INTRAVENOUS | Status: DC | PRN
  Administered 2018-02-25: 170 mg via INTRAVENOUS

## 2018-02-25 MED ORDER — ACYCLOVIR 400 MG PO TABS
400.0000 mg | ORAL_TABLET | Freq: Three times a day (TID) | ORAL | Status: DC
  Administered 2018-02-25 – 2018-02-27 (×6): 400 mg via ORAL
  Filled 2018-02-25 (×6): qty 1

## 2018-02-25 MED ORDER — TIMOLOL MALEATE 0.5 % OP SOLN
1.0000 [drp] | Freq: Two times a day (BID) | OPHTHALMIC | Status: DC
  Administered 2018-02-25 – 2018-02-27 (×4): 1 [drp] via OPHTHALMIC
  Filled 2018-02-25: qty 5

## 2018-02-25 MED ORDER — OLOPATADINE HCL 0.1 % OP SOLN
1.0000 [drp] | Freq: Two times a day (BID) | OPHTHALMIC | Status: DC
  Administered 2018-02-25 – 2018-02-27 (×4): 1 [drp] via OPHTHALMIC
  Filled 2018-02-25: qty 5

## 2018-02-25 MED ORDER — BUSPIRONE HCL 5 MG PO TABS
7.5000 mg | ORAL_TABLET | Freq: Three times a day (TID) | ORAL | Status: DC
  Administered 2018-02-25 – 2018-02-27 (×6): 7.5 mg via ORAL
  Filled 2018-02-25 (×6): qty 2

## 2018-02-25 MED ORDER — METOPROLOL TARTRATE 5 MG/5ML IV SOLN
1.0000 mg | INTRAVENOUS | Status: DC | PRN
  Administered 2018-02-25 (×2): 1 mg via INTRAVENOUS

## 2018-02-25 MED ORDER — PHENYLEPHRINE 40 MCG/ML (10ML) SYRINGE FOR IV PUSH (FOR BLOOD PRESSURE SUPPORT)
PREFILLED_SYRINGE | INTRAVENOUS | Status: AC
  Filled 2018-02-25: qty 10

## 2018-02-25 MED ORDER — ONDANSETRON HCL 4 MG/2ML IJ SOLN
INTRAMUSCULAR | Status: AC
  Filled 2018-02-25: qty 2

## 2018-02-25 MED ORDER — CLONIDINE HCL 0.2 MG PO TABS
0.2000 mg | ORAL_TABLET | Freq: Three times a day (TID) | ORAL | Status: DC
  Administered 2018-02-25 – 2018-02-27 (×6): 0.2 mg via ORAL
  Filled 2018-02-25 (×6): qty 1

## 2018-02-25 MED ORDER — HYDROMORPHONE HCL 1 MG/ML IJ SOLN
0.5000 mg | Freq: Once | INTRAMUSCULAR | Status: AC
  Administered 2018-02-25: 0.5 mg via INTRAVENOUS
  Filled 2018-02-25: qty 0.5

## 2018-02-25 MED ORDER — DONEPEZIL HCL 10 MG PO TABS
10.0000 mg | ORAL_TABLET | Freq: Every day | ORAL | Status: DC
  Administered 2018-02-25 – 2018-02-26 (×2): 10 mg via ORAL
  Filled 2018-02-25 (×2): qty 1

## 2018-02-25 MED ORDER — FENTANYL CITRATE (PF) 100 MCG/2ML IJ SOLN
INTRAMUSCULAR | Status: DC | PRN
  Administered 2018-02-25 (×2): 50 ug via INTRAVENOUS

## 2018-02-25 MED ORDER — MORPHINE SULFATE (PF) 2 MG/ML IV SOLN
INTRAVENOUS | Status: AC
  Filled 2018-02-25: qty 1

## 2018-02-25 MED ORDER — ATORVASTATIN CALCIUM 40 MG PO TABS
40.0000 mg | ORAL_TABLET | Freq: Every day | ORAL | Status: DC
  Administered 2018-02-25 – 2018-02-27 (×3): 40 mg via ORAL
  Filled 2018-02-25 (×3): qty 1

## 2018-02-25 MED ORDER — PHENYLEPHRINE HCL 10 MG/ML IJ SOLN
INTRAMUSCULAR | Status: AC
  Filled 2018-02-25: qty 1

## 2018-02-25 MED ORDER — PHENYLEPHRINE 40 MCG/ML (10ML) SYRINGE FOR IV PUSH (FOR BLOOD PRESSURE SUPPORT)
PREFILLED_SYRINGE | INTRAVENOUS | Status: DC | PRN
  Administered 2018-02-25 (×5): 80 ug via INTRAVENOUS

## 2018-02-25 MED ORDER — MONTELUKAST SODIUM 10 MG PO TABS
10.0000 mg | ORAL_TABLET | Freq: Every day | ORAL | Status: DC
  Administered 2018-02-25 – 2018-02-26 (×2): 10 mg via ORAL
  Filled 2018-02-25 (×2): qty 1

## 2018-02-25 MED ORDER — FLUOXETINE HCL 10 MG PO CAPS
10.0000 mg | ORAL_CAPSULE | Freq: Every day | ORAL | Status: DC
  Administered 2018-02-25 – 2018-02-27 (×3): 10 mg via ORAL
  Filled 2018-02-25 (×3): qty 1

## 2018-02-25 MED ORDER — 0.9 % SODIUM CHLORIDE (POUR BTL) OPTIME
TOPICAL | Status: DC | PRN
  Administered 2018-02-25: 1000 mL

## 2018-02-25 MED ORDER — INSULIN GLARGINE 100 UNIT/ML SOLOSTAR PEN: 40.0000 [IU] | PEN_INJECTOR | Freq: Two times a day (BID) | SUBCUTANEOUS | Status: DC

## 2018-02-25 MED ORDER — TOPIRAMATE 100 MG PO TABS
100.0000 mg | ORAL_TABLET | Freq: Two times a day (BID) | ORAL | Status: DC
  Administered 2018-02-25 – 2018-02-27 (×4): 100 mg via ORAL
  Filled 2018-02-25 (×4): qty 1

## 2018-02-25 MED ORDER — IOHEXOL 300 MG/ML  SOLN
INTRAMUSCULAR | Status: DC | PRN
  Administered 2018-02-25: 18 mL via URETHRAL

## 2018-02-25 SURGICAL SUPPLY — 22 items
BAG URINE DRAINAGE (UROLOGICAL SUPPLIES) ×3 IMPLANT
BAG URO CATCHER STRL LF (MISCELLANEOUS) ×3 IMPLANT
BASKET ZERO TIP NITINOL 2.4FR (BASKET) IMPLANT
CATH FOLEY 2WAY SLVR  5CC 16FR (CATHETERS) ×2
CATH FOLEY 2WAY SLVR 5CC 16FR (CATHETERS) ×1 IMPLANT
CATH INTERMIT  6FR 70CM (CATHETERS) ×3 IMPLANT
CATH URET WHISTLE 5FR ×3 IMPLANT
CLOTH BEACON ORANGE TIMEOUT ST (SAFETY) ×3 IMPLANT
COVER WAND RF STERILE (DRAPES) IMPLANT
GLOVE BIOGEL M STRL SZ7.5 (GLOVE) ×3 IMPLANT
GOWN STRL REUS W/TWL LRG LVL3 (GOWN DISPOSABLE) ×3 IMPLANT
GOWN STRL REUS W/TWL XL LVL3 (GOWN DISPOSABLE) ×3 IMPLANT
GUIDEWIRE ANG ZIPWIRE 038X150 (WIRE) ×3 IMPLANT
GUIDEWIRE STR DUAL SENSOR (WIRE) ×3 IMPLANT
HOLDER FOLEY CATH W/STRAP (MISCELLANEOUS) ×3 IMPLANT
MANIFOLD NEPTUNE II (INSTRUMENTS) ×3 IMPLANT
NEEDLE HYPO 25X1 1.5 SAFETY (NEEDLE) ×3 IMPLANT
PACK CYSTO (CUSTOM PROCEDURE TRAY) ×3 IMPLANT
PAD TELFA 2X3 NADH STRL (GAUZE/BANDAGES/DRESSINGS) ×3 IMPLANT
STENT URET 6FRX24 CONTOUR (STENTS) ×6 IMPLANT
TUBING CONNECTING 10 (TUBING) ×2 IMPLANT
TUBING CONNECTING 10' (TUBING) ×1

## 2018-02-25 NOTE — Anesthesia Preprocedure Evaluation (Addendum)
Anesthesia Evaluation  Patient identified by MRN, date of birth, ID band Patient awake    Reviewed: Allergy & Precautions, NPO status , Patient's Chart, lab work & pertinent test results  Airway Mallampati: III  TM Distance: >3 FB Neck ROM: Full    Dental  (+) Edentulous Upper, Missing   Pulmonary PE   Pulmonary exam normal breath sounds clear to auscultation       Cardiovascular hypertension, Pt. on medications Normal cardiovascular exam+ dysrhythmias Atrial Fibrillation  Rhythm:Regular Rate:Normal  ECG: SR, rate 99  Sees cardiologist ([place])   Neuro/Psych  Headaches, PSYCHIATRIC DISORDERS Anxiety Depression    GI/Hepatic Neg liver ROS, GERD  Medicated and Controlled,  Endo/Other  diabetes, Insulin Dependent, Oral Hypoglycemic Agents  Renal/GU negative Renal ROS     Musculoskeletal  (+) Arthritis , Gout   Abdominal (+) + obese,   Peds  Hematology  (+) anemia , HLD   Anesthesia Other Findings BILATERAL hydronephrosis  Reproductive/Obstetrics                            Anesthesia Physical Anesthesia Plan  ASA: III  Anesthesia Plan: General   Post-op Pain Management:    Induction: Intravenous  PONV Risk Score and Plan: 4 or greater and Ondansetron and Treatment may vary due to age or medical condition  Airway Management Planned: Oral ETT  Additional Equipment:   Intra-op Plan:   Post-operative Plan: Extubation in OR  Informed Consent: I have reviewed the patients History and Physical, chart, labs and discussed the procedure including the risks, benefits and alternatives for the proposed anesthesia with the patient or authorized representative who has indicated his/her understanding and acceptance.   Dental advisory given  Plan Discussed with: CRNA  Anesthesia Plan Comments:       Anesthesia Quick Evaluation

## 2018-02-25 NOTE — ED Provider Notes (Signed)
Patient seen/examined in the Emergency Department in conjunction with Midlevel Provider [facility] Patient reports fever, abdominal pain and confusion.  Patient found to have UTI with ureteral stone. Exam : Pt is awake, alert, no acute distress. Plan: Plan to admit to medical service it was a long hospital urology consultation.  She has been given IV Rocephin. PT and family have been updated on plan    Ripley Fraise, MD 02/25/18 [phone]

## 2018-02-25 NOTE — Progress Notes (Signed)
NP on call made aware of patient's c/o pain and no pain medication ordered. Will continue to monitor pt and carry out any new orders. [name] I

## 2018-02-25 NOTE — Transfer of Care (Signed)
Immediate Anesthesia Transfer of Care Note  Patient: [name]  Procedure(s) Performed: CYSTOSCOPY WITH BILATERAL RETROGRADE PYELOGRAM/BILATERAL URETERAL STENT PLACEMENT, BLADDER BIOPSIES WITH FULGERATION (Bilateral Ureter)  Patient Location: PACU  Anesthesia Type:General  Level of Consciousness: sedated  Airway & Oxygen Therapy: Patient Spontanous Breathing and Patient connected to face mask oxygen  Post-op Assessment: Report given to RN and Post -op Vital signs reviewed and stable  Post vital signs: Reviewed and stable  Last Vitals:  Vitals Value Taken Time  BP    Temp    Pulse 102 02/25/2018  1:48 PM  Resp 22 02/25/2018  1:48 PM  SpO2 98 % 02/25/2018  1:48 PM  Vitals shown include unvalidated device data.  Last Pain:  Vitals:   02/25/18 1041  TempSrc: Oral  PainSc:       Patients Stated Pain Goal: 4 (83/25/49 8264)  Complications: No apparent anesthesia complications

## 2018-02-25 NOTE — Progress Notes (Signed)
Patient seen and evaluated, chart reviewed, please see EMR for updated orders. Please see full H&P dictated by admitting physician Dr Darrick Meigs for same date of service.    Brief Summary 78 y.o. female, with history of diabetes mellitus type 2, hypertension, pulmonary embolism on chronic anticoagulation with Pradaxa since 2012, dyslipidemia admitted on 02/25/2018 and transferred from [facility] to [facility] long hospital for urological intervention due to hematuria with finding of nephrolithiasis with hydronephrosis   Plan:- 1)Nephrolithiasis with hematuria and hydronephrosis-----urology consult from Dr. Junious Silk appreciated, on 02/25/2018  patient underwent Cystoscopy, bilateral retrograde pyelogram, bilateral ureteral stent placement; Bladder biopsy fulguration 0.5 to 2 cm--- continue IV Rocephin pending cultures  2)H/o PE--- diagnosed in 2012, Pradaxa currently on hold, may resume Pradaxa postop  3) history of chronic normocytic and hypochromic anemia----baseline hemoglobin usually between 9 and 10 hemoglobin currently down to 8.6 monitor closely and transfuse as clinically indicated  4)DM2--A1c is 8.1 reflecting poorly controlled diabetes mellitus, however Allow some permissive Hyperglycemia rather than risk life-threatening hypoglycemia in a patient with unreliable oral intake. Use Novolog/Humalog Sliding scale insulin with Accu-Cheks/Fingersticks as ordered--- plan to resume Lantus 40 units subcu twice daily once oral intake is more reliable  5)HTN--- stable, continue current meds  Patient seen and evaluated, chart reviewed, please see EMR for updated orders. Please see full H&P dictated by admitting physician Dr Darrick Meigs for same date of service.

## 2018-02-25 NOTE — H&P (Signed)
TR[facility] H&P    Patient Demographics:    [name], is a 78 y.o. female  MRN: [id number]  DOB - [birth year]  Admit Date - 02/24/2018  Referring MD/NP/PA: Dr. Christy Gentles  Outpatient Primary MD for the patient is [name], MD  Patient coming from: Home  Chief complaint-blood in urine   HPI:    [name]  is a 78 y.o. female, with history of diabetes mellitus type 2, hypertension, pulmonary embolism on chronic anticoagulation with Pradaxa since 2012, dyslipidemia, came to hospital with complaint of blood in the urine and fever.  As per patient she has been having chronic hematuria with suprapubic pain after being diagnosed with UTI in May.  She has intermittent episodes of hematuria.  Patient was febrile today with T-max 100.8 at home and was confused so family brought her to the hospital for further evaluation. Patient complains of suprapubic pain. Denies nausea vomiting or diarrhea. Complains of intermittent chest pain and shortness of breath. No previous history of stroke or seizures. No history of CAD  In the ED, CT abdomen pelvis showed mild left hydronephrosis with 3 mm stone in the distal left ureter and left hemipelvis.  Right kidney lower pole 4 mm nonobstructing stone. Urology Dr. Junious Silk was consulted by the ED physician and he recommended patient to be transferred to [facility] long in case she needs a procedure.   Review of systems:    In addition to the HPI above,    All other systems reviewed and are negative.    Past History of the following :    Past Medical History:  Diagnosis Date  . Anemia, iron deficiency 2012   Evaluated by Dr. Oneida Alar; H&H of 9.3/30.8 with [place] in 10/2010; 3/3 positive Hemoccult cards in 07/2011  . Anxiety    was on Prozac for a couple of weeks  . Atrial fibrillation ([place])    takes Coumadin daily  . Bruises easily    takes Coumadin  . Chronic  anticoagulation 07/21/2010  . Chronic back pain    related to knee pain  . Degenerative joint disease    Knees  . Depression   . Diabetes mellitus    takes Metformin daily  . Gastroesophageal reflux disease    takes Omeprazole daily  . Glaucoma   . Glaucoma    uses eye drops at night  . History of blood transfusion 1969  . History of gout    was on medication but taken off of several months ago  . Hx of colonic polyps   . Hx of migraines    last one about a yr ago;takes Topamax daily  . Hyperlipidemia    takes Pravastatin daily  . Hypertension    takes Hyzaar daily and Propranolol   . Insomnia    takes Restoril prn  . Joint pain   . Joint swelling   . Memory loss    takes donepezil  . Migraine   . Migraine   . Nocturia   . Overweight(278.02)   . Pneumonia 1969  hx of  . Pulmonary embolism [facility]) May 2012   acute presentation, bilateral PE  . Skin spots-aging   . Urinary frequency       Past Surgical History:  Procedure Laterality Date  . CATARACT EXTRACTION W/PHACO Right 04/11/2012   Procedure: CATARACT EXTRACTION PHACO AND INTRAOCULAR LENS PLACEMENT (IOC);  Surgeon: Elta Guadeloupe T. Gershon Crane, MD;  Location: [facility] OR[facility];  Service: Ophthalmology;  Laterality: Right;  CDE=9.61  . CATARACT EXTRACTION W/PHACO Left 12/26/2012   Procedure: CATARACT EXTRACTION PHACO AND INTRAOCULAR LENS PLACEMENT (IOC);  Surgeon: Elta Guadeloupe T. Gershon Crane, MD;  Location: [facility];  Service: Ophthalmology;  Laterality: Left;  CDE:7.74  . COLONOSCOPY  Jan 2002; 2012   2002: Dr. Tamala Julian, ext. hemorrhoids, nl colon; 2012-adenomatous polyps, gastritis on EGD  . DILATION AND CURETTAGE OF UTERUS    . ESOPHAGOGASTRODUODENOSCOPY  08/24/2011   ESOPHAGOGASTRODUODENOSCOPY; esophageal dilatation; Rogene Houston, MD;  . Freda Munro CAPSULE STUDY  08/18/2011   Procedure: GIVENS CAPSULE STUDY;  Surgeon: Rogene Houston, MD;  Location: [facility] ENDO SUITE;  Service: Endoscopy;  Laterality: N/A;  730  . KNEE ARTHROSCOPY W/ MENISCECTOMY  90's   Left   . ORIF ANKLE FRACTURE Right 90's  . TONSILLECTOMY    . TOTAL KNEE ARTHROPLASTY  10/20/2011   Procedure: TOTAL KNEE ARTHROPLASTY;  Surgeon: Ninetta Lights, MD;  Location: [place];  Service: Orthopedics;  Laterality: Left;  . UPPER GASTROINTESTINAL ENDOSCOPY    . URETHRAL DILATION        Social History:      Social History   Tobacco Use  . Smoking status: Never Smoker  . Smokeless tobacco: Never Used  Substance Use Topics  . Alcohol use: No       Family History :     Family History  Problem Relation Age of Onset  . Diabetes Mother   . Hypertension Mother   . Arthritis Mother   . Heart failure Mother   . Migraines Mother   . Kidney failure Mother   . Leukemia Father   . Migraines Father   . Kidney failure Father   . Diabetes Sister   . Hypertension Sister   . Diabetes Brother   . Hypertension Brother   . Hypertension Brother   . Hypertension Brother   . Hypertension Brother   . Diabetes Brother   . Diabetes Brother   . Diabetes Brother   . Pulmonary embolism Sister   . Migraines Sister   . Kidney failure Brother   . Colon cancer Neg Hx   . Colon polyps Neg Hx       Home Medications:   Prior to Admission medications   Medication Sig Start Date End Date Taking? Authorizing Provider  acetaminophen (TYLENOL) 500 MG tablet Take 500 mg by mouth every 6 (six) hours as needed for mild pain.   Yes [provider]  acyclovir (ZOVIRAX) 400 MG tablet TAKE 1 TABLET (400 MG TOTAL) BY MOUTH 3 (THREE) TIMES DAILY. 09/12/17  Yes [name], MD  amLODipine (NORVASC) 10 MG tablet TAKE 1 TABLET BY MOUTH EVERY DAY Patient taking differently: Take 10 mg by mouth daily.  07/12/17  Yes [name], MD  atorvastatin (LIPITOR) 40 MG tablet TAKE 1 TABLET BY MOUTH EVERY DAY Patient taking differently: Take 40 mg by mouth daily.  09/26/17  Yes [name], MD  busPIRone (BUSPAR) 7.5 MG tablet TAKE 1 TABLET BY MOUTH 3 TIMES DAILY Patient taking  differently: Take 7.5 mg by mouth 3 (three)  times daily.  12/23/17  Yes [name], MD  cloNIDine (CATAPRES) 0.2 MG tablet TAKE 1 TABLET (0.2 MG TOTAL) BY MOUTH 3 (THREE) TIMES DAILY. Patient taking differently: Take 0.6 mg by mouth at bedtime.  10/04/17  Yes [name], MD  colchicine 0.6 MG tablet Take 1 tablet (0.6 mg total) by mouth daily. May decrease to once per day as needed for diarrhea 06/27/17  Yes Tanna Furry, MD  COMBIGAN 0.2-0.5 % ophthalmic solution Place 1 drop into both eyes 2 (two) times daily. 03/23/17  Yes [provider]  conjugated estrogens (PREMARIN) vaginal cream Place vaginally 2 (two) times a week. Apply sparingly ( fingertip)  Twice weekly to vaginal area on Wednesdays and Fridays 01/30/18  Yes Johnson, Clanford L, MD  dabigatran (PRADAXA) 150 MG CAPS capsule Take 1 capsule (150 mg total) by mouth every 12 (twelve) hours. 02/13/18  Yes BranchAlphonse Guild, MD  donepezil (ARICEPT) 10 MG tablet TAKE 1 TABLET BY MOUTH EVERYDAY AT BEDTIME Patient taking differently: Take 10 mg by mouth at bedtime.  02/13/18  Yes [name], MD  Fluoxetine HCl, PMDD, 10 MG TABS Take 1 tablet (10 mg total) by mouth daily. 01/27/18  Yes Johnson, Clanford L, MD  hydrOXYzine (ATARAX/VISTARIL) 25 MG tablet Take 1 tablet (25 mg total) by mouth 3 (three) times daily as needed for itching. 01/27/18  Yes Johnson, Clanford L, MD  imipramine (TOFRANIL) 50 MG tablet TAKE 2 TABLETS (100 MG TOTAL) BY MOUTH AT BEDTIME. 07/12/17  Yes [name], MD  Insulin Glargine (LANTUS SOLOSTAR) 100 UNIT/ML Solostar Pen Inject 40 Units into the skin 2 (two) times daily. 01/27/18  Yes Johnson, Clanford L, MD  meclizine (ANTIVERT) 25 MG tablet TAKE 1 TABLET THREE TIMES DAILY AS NEEDED FOR DIZZINESS. Patient taking differently: Take 25 mg by mouth 3 (three) times daily as needed for nausea.  02/01/18  Yes [name], MD  montelukast (SINGULAIR) 10 MG tablet TAKE 1 TABLET BY MOUTH  AT BEDTIME Patient taking differently: Take 10 mg by mouth at bedtime.  08/23/17  Yes [name], MD  nystatin (MYCOSTATIN/NYSTOP) powder Apply topically daily as needed (for rash under breasts). 01/27/18  Yes Johnson, Clanford L, MD  olopatadine (PATANOL) 0.1 % ophthalmic solution Place 1 drop into both eyes 2 (two) times daily. 01/27/18  Yes Johnson, Clanford L, MD  omeprazole (PRILOSEC) 40 MG capsule Take 1 capsule (40 mg total) by mouth 2 (two) times daily. 01/27/18  Yes Johnson, Clanford L, MD  oxyCODONE-acetaminophen (PERCOCET) 10-325 MG tablet Take 1 tablet by mouth 2 (two) times daily as needed for pain (back pain and arthritis).   Yes [provider]  phenazopyridine (AZO-STANDARD) 95 MG tablet Take 95 mg by mouth 3 (three) times daily as needed for pain.   Yes [provider]  SUMAtriptan (IMITREX) 5 MG/ACT nasal spray Place 1 spray into the nose every 2 (two) hours as needed for migraine.  01/16/17  Yes [provider]  topiramate (TOPAMAX) 100 MG tablet TAKE 1 TABLET BY MOUTH TWICE A DAY Patient taking differently: Take 100 mg by mouth 2 (two) times daily.  09/12/17  Yes [name], MD  VENTOLIN HFA 108 (90 Base) MCG/ACT inhaler INHALE 2 PUFFS INTO THE LUNGS EVERY 6 (SIX) HOURS AS NEEDED FOR WHEEZING OR SHORTNESS OF BREATH. 05/26/16  Yes [name], MD  Vitamin D, Ergocalciferol, (DRISDOL) 50000 units CAPS capsule TAKE 1 CAPSULE (50,000 UNITS TOTAL) BY MOUTH ONCE A  WEEK. Patient taking differently: Take 50,000 Units by mouth every 7 (seven) days. Friday 11/18/17  Yes [name], MD  benzonatate (TESSALON) 100 MG capsule Take 1 capsule (100 mg total) by mouth 2 (two) times daily as needed for cough. Patient not taking: Reported on 02/24/2018 01/30/18   [name], MD  Blood Glucose Monitoring Suppl (ACCU-CHEK AVIVA PLUS) w/Device KIT For once daily testing dx E11.9 11/24/15   [name], MD  glucose blood (ONE TOUCH  ULTRA TEST) test strip USE TO TEST TWICE DAILY 12/26/17   [name], MD     Allergies:     Penicillins   Physical Exam:   Vitals  Blood pressure (!) 161/76, pulse 95, temperature 98.9 F (37.2 C), temperature source Oral, resp. rate 19, height 5' 7"  (1.702 m), weight 102.5 kg, SpO2 98 %.  1.  General: Appears in no acute distress  2. Psychiatric:  Intact judgement and  insight, awake alert, oriented x 3.  3. Neurologic: No focal neurological deficits, all cranial nerves intact.Strength 5/5 all 4 extremities, sensation intact all 4 extremities, plantars down going.  4. Eyes :  anicteric sclerae, moist conjunctivae with no lid lag. PERRLA.  5. ENMT:  Oropharynx clear with moist mucous membranes and good dentition  6. Neck:  supple, no cervical lymphadenopathy appriciated, No thyromegaly  7. Respiratory : Normal respiratory effort, good air movement bilaterally,clear to  auscultation bilaterally  8. Cardiovascular : RRR, no gallops, rubs or murmurs, no leg edema  9. Gastrointestinal:  Soft, mild suprapubic tenderness, no organomegaly.  No rigidity or guarding.      10. Skin:  No cyanosis, normal texture and turgor, no rash, lesions or ulcers  11.Musculoskeletal:  Good muscle tone,  joints appear normal , no effusions,  normal range of motion    Data Review:    CBC Recent Labs  Lab 02/24/18 2257  WBC 12.8*  HGB 8.8*  HCT 30.5*  PLT 427*  MCV 83.6  MCH 24.1*  MCHC 28.9*  RDW 15.1  LYMPHSABS 1.9  MONOABS 1.2*  EOSABS 0.0  BASOSABS 0.0   ------------------------------------------------------------------------------------------------------------------  Results for orders placed or performed during the hospital encounter of 02/24/18 (from the past 48 hour(s))  CBC with Differential     Status: Abnormal   Collection Time: 02/24/18 10:57 PM  Result Value Ref Range   WBC 12.8 (H) 4.0 - 10.5 K/uL   RBC 3.65 (L) 3.87 - 5.11 MIL/uL   Hemoglobin  8.8 (L) 12.0 - 15.0 g/dL   HCT 30.5 (L) 36.0 - 46.0 %   MCV 83.6 80.0 - 100.0 fL   MCH 24.1 (L) 26.0 - 34.0 pg   MCHC 28.9 (L) 30.0 - 36.0 g/dL   RDW 15.1 11.5 - 15.5 %   Platelets 427 (H) 150 - 400 K/uL   nRBC 0.0 0.0 - 0.2 %   Neutrophils Relative % 76 %   Neutro Abs 9.7 (H) 1.7 - 7.7 K/uL   Lymphocytes Relative 15 %   Lymphs Abs 1.9 0.7 - 4.0 K/uL   Monocytes Relative 9 %   Monocytes Absolute 1.2 (H) 0.1 - 1.0 K/uL   Eosinophils Relative 0 %   Eosinophils Absolute 0.0 0.0 - 0.5 K/uL   Basophils Relative 0 %   Basophils Absolute 0.0 0.0 - 0.1 K/uL   Immature Granulocytes 0 %   Abs Immature Granulocytes 0.04 0.00 - 0.07 K/uL    Comment: Performed at [facility], [address], [place],  [place] [address]  Comprehensive metabolic panel     Status: Abnormal   Collection Time: 02/24/18 10:57 PM  Result Value Ref Range   Sodium 135 135 - 145 mmol/L   Potassium 4.4 3.5 - 5.1 mmol/L   Chloride 105 98 - 111 mmol/L   CO2 21 (L) 22 - 32 mmol/L   Glucose, Bld 189 (H) 70 - 99 mg/dL   BUN 18 8 - 23 mg/dL   Creatinine, Ser 1.29 (H) 0.44 - 1.00 mg/dL   Calcium 8.6 (L) 8.9 - 10.3 mg/dL   Total Protein 6.9 6.5 - 8.1 g/dL   Albumin 3.2 (L) 3.5 - 5.0 g/dL   AST 13 (L) 15 - 41 U/L   ALT 13 0 - 44 U/L   Alkaline Phosphatase 84 38 - 126 U/L   Total Bilirubin 0.2 (L) 0.3 - 1.2 mg/dL   GFR calc non Af Amer 40 (L) >60 mL/min   GFR calc Af Amer 46 (L) >60 mL/min   Anion gap 9 5 - 15    Comment: Performed at [facility], [address], [place], [place] [address]  Lipase, blood     Status: None   Collection Time: 02/24/18 10:57 PM  Result Value Ref Range   Lipase 19 11 - 51 U/L    Comment: Performed at [facility], [address], [place], [place] [address]  Urinalysis, Routine w reflex microscopic     Status: Abnormal   Collection Time: 02/24/18 11:20 PM  Result Value Ref Range   Color, Urine YELLOW YELLOW   APPearance TURBID (A) CLEAR   Specific Gravity, Urine 1.025 1.005 - 1.030   pH  5.0 5.0 - 8.0   Glucose, UA NEGATIVE NEGATIVE mg/dL   Hgb urine dipstick LARGE (A) NEGATIVE   Bilirubin Urine NEGATIVE NEGATIVE   Ketones, ur NEGATIVE NEGATIVE mg/dL   Protein, ur 100 (A) NEGATIVE mg/dL   Nitrite NEGATIVE NEGATIVE   Leukocytes, UA MODERATE (A) NEGATIVE   RBC / HPF >50 (H) 0 - 5 RBC/hpf   WBC, UA >50 (H) 0 - 5 WBC/hpf   Bacteria, UA RARE (A) NONE SEEN   WBC Clumps PRESENT    Mucus PRESENT     Comment: Performed at [facility], [address], [place],  [address]    Chemistries  Recent Labs  Lab 02/24/18 2257  NA 135  K 4.4  CL 105  CO2 21*  GLUCOSE 189*  BUN 18  CREATININE 1.29*  CALCIUM 8.6*  AST 13*  ALT 13  ALKPHOS 84  BILITOT 0.2*   ------------------------------------------------------------------------------------------------------------------  ------------------------------------------------------------------------------------------------------------------ GFR: Estimated Creatinine Clearance: 44.3 mL/min (A) (by C-G formula based on SCr of 1.29 mg/dL (H)). Liver Function Tests: Recent Labs  Lab 02/24/18 2257  AST 13*  ALT 13  ALKPHOS 84  BILITOT 0.2*  PROT 6.9  ALBUMIN 3.2*   Recent Labs  Lab 02/24/18 2257  LIPASE 19   No results for input(s): AMMONIA in the last 168 hours. Coagulation Profile: No results for input(s): INR, PROTIME in the last 168 hours. Cardiac Enzymes: No results for input(s): CKTOTAL, CKMB, CKMBINDEX, TROPONINI in the last 168 hours. BNP (last 3 results) No results for input(s): PROBNP in the last 8760 hours. HbA1C: No results for input(s): HGBA1C in the last 72 hours. CBG: No results for input(s): GLUCAP in the last 168 hours. Lipid Profile: No results for input(s): CHOL, HDL, LDLCALC, TRIG, CHOLHDL, LDLDIRECT in the last 72 hours. Thyroid Function Tests: No results for input(s): TSH,  T4TOTAL, FREET4, T3FREE, THYROIDAB in the last 72 hours. Anemia Panel: No results for input(s): VITAMINB12,  FOLATE, FERRITIN, TIBC, IRON, RETICCTPCT in the last 72 hours.  --------------------------------------------------------------------------------------------------------------- Urine analysis:    Component Value Date/Time   COLORURINE YELLOW 02/24/2018 2320   APPEARANCEUR TURBID (A) 02/24/2018 2320   LABSPEC 1.025 02/24/2018 2320   PHURINE 5.0 02/24/2018 2320   GLUCOSEU NEGATIVE 02/24/2018 2320   HGBUR LARGE (A) 02/24/2018 2320   HGBUR negative 01/06/2010 0929   BILIRUBINUR NEGATIVE 02/24/2018 2320   BILIRUBINUR moderate 11/17/2016 1438   KETONESUR NEGATIVE 02/24/2018 2320   PROTEINUR 100 (A) 02/24/2018 2320   UROBILINOGEN >=8.0 (A) 11/17/2016 1438   UROBILINOGEN 0.2 10/12/2011 1556   NITRITE NEGATIVE 02/24/2018 2320   LEUKOCYTESUR MODERATE (A) 02/24/2018 2320      Imaging Results:    Dg Chest 2 View  Result Date: 02/25/2018 CLINICAL DATA:  Cough and fever EXAM: CHEST - 2 VIEW COMPARISON:  01/24/2018, CT 01/24/2018 FINDINGS: Borderline to mild cardiomegaly with aortic atherosclerosis. No acute consolidation or effusion. No pneumothorax. IMPRESSION: No active cardiopulmonary disease.  Borderline to mild cardiomegaly. Electronically Signed   By: Donavan Foil M.D.   On: 02/25/2018 00:21   Ct Abdomen Pelvis W Contrast  Result Date: 02/25/2018 CLINICAL DATA:  78 y/o  F; hematuria, dysuria, fever. EXAM: CT ABDOMEN AND PELVIS WITH CONTRAST TECHNIQUE: Multidetector CT imaging of the abdomen and pelvis was performed using the standard protocol following bolus administration of intravenous contrast. CONTRAST:  57m ISOVUE-300 IOPAMIDOL (ISOVUE-300) INJECTION 61% COMPARISON:  07/17/2010 CT abdomen and pelvis FINDINGS: Lower chest: No acute abnormality. Hepatobiliary: No focal liver abnormality is seen. No gallstones, gallbladder wall thickening, or biliary dilatation. Pancreas: Unremarkable. No pancreatic ductal dilatation or surrounding inflammatory changes. Spleen: Normal in size without focal  abnormality. Adrenals/Urinary Tract: Normal adrenal glands. 4 mm right kidney lower pole stone. Multiple bilateral renal cysts measuring up to 19 mm in the upper pole of left kidney. No right hydronephrosis. Mild left hydronephrosis with 3 mm stone in the distal left ureter in left hemipelvis (series 5, image 82). Normal bladder. Stomach/Bowel: Stomach is within normal limits. Appendix appears normal. No evidence of bowel wall thickening, distention, or inflammatory changes. Mild sigmoid diverticulosis. Vascular/Lymphatic: No significant vascular findings are present. No enlarged abdominal or pelvic lymph nodes. Reproductive: Uterus and bilateral adnexa are unremarkable. Other: No abdominal wall hernia or abnormality. No abdominopelvic ascites. Musculoskeletal: Lumbar spondylosis with advanced facet arthropathy greatest at L4-5 where there is grade 1 anterolisthesis. No acute osseous abnormality identified. IMPRESSION: 1. Mild left hydronephrosis with 3 mm stone in distal left ureter in left hemipelvis. 2. Right kidney lower pole 4 mm nonobstructing stone. 3. Mild sigmoid diverticulosis. 4. Lumbar spondylosis with advanced facet arthropathy greatest at L4-5 where there is grade 1 anterolisthesis. Electronically Signed   By: LKristine GarbeM.D.   On: 02/25/2018 00:43    My personal review of EKG: Rhythm NSR   Assessment & Plan:    Active Problems:   Hematuria   1. UTI-patient has abnormal UA, also symptomatic with dysuria.  Started on IV Rocephin.  We will continue with IV Rocephin 1 g IV daily.  Follow urine culture results.  2. Hematuria-CT scan shows mild left hydronephrosis with 3 mm stone in the distal left ureter and left hemipelvis.  Urology has been consulted, Dr. EJunious Silkwill see patient in a.m.  Will hold Pradaxa at this time.  3. History of pulmonary embolism-patient has been on Pradaxa since 2012, will  hold Pradaxa at this time due to above.  4. Diabetes mellitus-continue with  Lantus 40 units subcu twice a day, will start sliding scale insulin with NovoLog.  Patient has been kept n.p.o. in anticipation for procedure.  If no procedure is planned in morning consider starting on carb modified diet.  5. Hypertension-blood pressure stable, continue Catapres, amlodipine    DVT Prophylaxis-    SCDs   AM Labs Ordered, also please review Full Orders  Family Communication: Admission, patients condition and plan of care including tests being ordered have been discussed with the patient and family members at bedside* who indicate understanding and agree with the plan and Code Status.  Code Status: DNR  Admission status: Inpatient: Based on patients clinical presentation and evaluation of above clinical data, I have made determination that patient meets Inpatient criteria at this time.  Patient will need more than 2 midnight stay in the hospital.  Started on IV Rocephin.  Urology to evaluate for ureteral stone.  Time spent in minutes : 60 minutes   Oswald Hillock M.D on 02/25/2018 at 3:09 AM  Between 7am to 7pm - Pager - [phone]. After 7pm go to [email or web address] - password [facility]   [facility] - Office  [phone]

## 2018-02-25 NOTE — Anesthesia Postprocedure Evaluation (Signed)
Anesthesia Post Note  Patient: [name]  Procedure(s) Performed: CYSTOSCOPY WITH BILATERAL RETROGRADE PYELOGRAM/BILATERAL URETERAL STENT PLACEMENT, BLADDER BIOPSIES WITH FULGERATION (Bilateral Ureter)     Patient location during evaluation: PACU Anesthesia Type: General Level of consciousness: awake Pain management: pain level controlled Vital Signs Assessment: post-procedure vital signs reviewed and stable Respiratory status: spontaneous breathing, nonlabored ventilation, respiratory function stable and patient connected to nasal cannula oxygen Cardiovascular status: blood pressure returned to baseline and stable Postop Assessment: no apparent nausea or vomiting Anesthetic complications: no    Last Vitals:  Vitals:   02/25/18 1445 02/25/18 1500  BP: (!) 157/80 (!) 154/82  Pulse: 95 94  Resp: 17 14  Temp:  37.1 C  SpO2: 100% 97%    Last Pain:  Vitals:   02/25/18 1445  TempSrc:   PainSc: 8                  Ryan P Ellender

## 2018-02-25 NOTE — Op Note (Addendum)
Preoperative diagnosis: Right renal stone, left ureteral stone, UTI, gross hematuria versus vaginal bleeding Postop diagnosis: Right renal stone, left ureteral stone, UTI, bladder erythema  Procedure: Cystoscopy, bilateral retrograde pyelogram, bilateral ureteral stent placement; Bladder biopsy fulguration 0.5 to 2 cm Exam under anesthesia  Surgeon: Junious Silk  Anesthesia: General  Indication for procedure: [name] is a 78 year old female without abdominal and left flank pain.  CT showed a 3 mm left mid to distal stone with hydro-and she had concern for infection such as white count, mild tachycardia and mild fever.  She was brought for an urgent left ureteral stent and we also left a right stent to plan to treat the right kidney in a staged fashion.  She also complained of about a year of UTI-like symptoms and gross hematuria.  Findings: On exam under anesthesia there was severe vaginal atrophy but no mass.  The meatus appeared normal.  On bimanual exam no palpable mass was noted.  The cervix was just palpable with the fingertip and felt normal.  I was not able to visualize the cervix.  There was no vaginal bleeding.  The bladder and urethra were palpably normal.  On cystoscopy the urethra appeared normal.  The trigone and ureteral orifice ease were in the normal orthotopic position.  No stone or foreign body noted in the bladder.  The bladder had 3 conspicuous areas of erythema that were oozing concerning for CIS versus inflammation.  The posterior area was about 15 mm, right superior lateral area about 2 cm, left lateral area about 2 cm.  This is right posterior, right superior and left lateral.  These were biopsied and fulgurated.  Left retrograde pyelogram-this outlined a single ureter single collecting system unit.  There was a filling defect in the mid ureter consistent with the stone and mild proximal hydronephrosis.  Right retrograde pyelogram-this outlined a single ureter single  collecting system unit without filling defect, stricture or dilation.  After the stent was placed on the left there was cloudy proteinaceous chunky urine draining from the stent.  Description of procedure: After consent was obtained patient brought to the operating room.  A timeout was performed to confirm the patient and procedure.  I did an exam under anesthesia.  There was dependent blood in the bladder.  This was irrigated.  There were 3 conspicuous areas of erythema.  The left ureteral orifice was cannulated with a whistle-tip catheter and left retrograde injection of contrast was performed.  The right ureteral orifice was cannulated with the whistle-tip catheter and retrograde injection of contrast performed.  A Glidewire was then advanced up the right ureter and a 6x24 stent placed.  A good coil was seen in the kidney and a good coil in the bladder.  Similarly a Glidewire was passed up the left side and a 6 x 24 cm stent advanced.  Good coil seen in the kidney and a good coil in the bladder.  The right side was draining clear, the left side was draining a white chunky purulent urine.  Next I took the flexible biopsy forceps and biopsied right posterior, right superior lateral and left bladder biopsies.  The areas that were oozing were fulgurated.  Hemostasis was excellent at low pressure.  A 16 French Foley catheter was placed in left to gravity drainage. She was given another gram of Rocephin.  She was awakened and taken recovery room in stable condition.  Complications: None  Blood loss: Minimal  Specimens to pathology: Right posterior bladder biopsy Right  superior lateral bladder biopsy Left bladder biopsy  Drains: 6 x 24 cm bilateral ureteral stents  Disposition patient stable to PACU  Plan-we will plan to bring patient back for bilateral ureteroscopy in a few weeks.

## 2018-02-25 NOTE — Consult Note (Signed)
Consultation: Left ureteral stone, left hydronephrosis, sepsis Requested by: Dr. Eleonore Chiquito   History of Present Illness: 78 year old African-American female with history of abdominal and flank pain over the past several days.  CT scan was done last night which revealed a 3 mm distal left ureteral stone and mild left hydronephrosis.  UA consistent with infection, white count up this morning to 13.8.  Slight bump in creatinine but better today at 1.19.  She continues to be mildly tachycardic and temperature 100.8.  UA revealed rare bacteria, greater than 50 red cells, greater than 50 white cells.  The patient also had some confusion.  She is using the pure wick catheter and has not had a change in her pain or seen a stone pass.  Family asked about treating the stone in the right kidney as well.  Patient is with her sister and niece this morning.  They report they have noted blood in the patient's diaper over the past year.  Question whether this is urologic or gynecologic.  I did recommend they follow-up with gynecology for an exam.  Uterus and bilateral adnexa were unremarkable on the CT.  She also complains of recurrent UTI.   History of diabetes mellitus type 2, hypertension, pulmonary embolism on chronic anticoagulation with Pradaxa since 2012.  Past Medical History:  Diagnosis Date  . Anemia, iron deficiency 2012   Evaluated by Dr. Oneida Alar; H&H of 9.3/30.8 with [place] in 10/2010; 3/3 positive Hemoccult cards in 07/2011  . Anxiety    was on Prozac for a couple of weeks  . Atrial fibrillation ([place])    takes Coumadin daily  . Bruises easily    takes Coumadin  . Chronic anticoagulation 07/21/2010  . Chronic back pain    related to knee pain  . Degenerative joint disease    Knees  . Depression   . Diabetes mellitus    takes Metformin daily  . Gastroesophageal reflux disease    takes Omeprazole daily  . Glaucoma   . Glaucoma    uses eye drops at night  . History of blood transfusion 1969  .  History of gout    was on medication but taken off of several months ago  . Hx of colonic polyps   . Hx of migraines    last one about a yr ago;takes Topamax daily  . Hyperlipidemia    takes Pravastatin daily  . Hypertension    takes Hyzaar daily and Propranolol   . Insomnia    takes Restoril prn  . Joint pain   . Joint swelling   . Memory loss    takes donepezil  . Migraine   . Migraine   . Nocturia   . Overweight(278.02)   . Pneumonia 1969   hx of  . Pulmonary embolism [facility]) May 2012   acute presentation, bilateral PE  . Skin spots-aging   . Urinary frequency    Past Surgical History:  Procedure Laterality Date  . CATARACT EXTRACTION W/PHACO Right 04/11/2012   Procedure: CATARACT EXTRACTION PHACO AND INTRAOCULAR LENS PLACEMENT (IOC);  Surgeon: Elta Guadeloupe T. Gershon Crane, MD;  Location: [facility];  Service: Ophthalmology;  Laterality: Right;  CDE=9.61  . CATARACT EXTRACTION W/PHACO Left 12/26/2012   Procedure: CATARACT EXTRACTION PHACO AND INTRAOCULAR LENS PLACEMENT (IOC);  Surgeon: Elta Guadeloupe T. Gershon Crane, MD;  Location: [facility];  Service: Ophthalmology;  Laterality: Left;  CDE:7.74  . COLONOSCOPY  Jan 2002; 2012   2002: Dr. Tamala Julian, ext. hemorrhoids, nl colon; 2012-adenomatous polyps, gastritis on EGD  .  DILATION AND CURETTAGE OF UTERUS    . ESOPHAGOGASTRODUODENOSCOPY  08/24/2011   ESOPHAGOGASTRODUODENOSCOPY; esophageal dilatation; Rogene Houston, MD;  . Freda Munro CAPSULE STUDY  08/18/2011   Procedure: GIVENS CAPSULE STUDY;  Surgeon: Rogene Houston, MD;  Location: [facility];  Service: Endoscopy;  Laterality: N/A;  730  . KNEE ARTHROSCOPY W/ MENISCECTOMY  90's   Left  . ORIF ANKLE FRACTURE Right 90's  . TONSILLECTOMY    . TOTAL KNEE ARTHROPLASTY  10/20/2011   Procedure: TOTAL KNEE ARTHROPLASTY;  Surgeon: Ninetta Lights, MD;  Location: [place];  Service: Orthopedics;  Laterality: Left;  . UPPER GASTROINTESTINAL ENDOSCOPY    . URETHRAL DILATION      Home Medications:  Medications Prior to  Admission  Medication Sig Dispense Refill Last Dose  . acetaminophen (TYLENOL) 500 MG tablet Take 500 mg by mouth every 6 (six) hours as needed for mild pain.   02/24/2018 at 1900  . acyclovir (ZOVIRAX) 400 MG tablet TAKE 1 TABLET (400 MG TOTAL) BY MOUTH 3 (THREE) TIMES DAILY. 30 tablet 5 02/24/2018 at Unknown time  . amLODipine (NORVASC) 10 MG tablet TAKE 1 TABLET BY MOUTH EVERY DAY (Patient taking differently: Take 10 mg by mouth daily. ) 90 tablet 1 02/24/2018 at Unknown time  . atorvastatin (LIPITOR) 40 MG tablet TAKE 1 TABLET BY MOUTH EVERY DAY (Patient taking differently: Take 40 mg by mouth daily. ) 90 tablet 1 02/23/2018 at Unknown time  . busPIRone (BUSPAR) 7.5 MG tablet TAKE 1 TABLET BY MOUTH 3 TIMES DAILY (Patient taking differently: Take 7.5 mg by mouth 3 (three) times daily. ) 270 tablet 1 02/24/2018 at Unknown time  . cloNIDine (CATAPRES) 0.2 MG tablet TAKE 1 TABLET (0.2 MG TOTAL) BY MOUTH 3 (THREE) TIMES DAILY. (Patient taking differently: Take 0.6 mg by mouth at bedtime. ) 270 tablet 1 02/24/2018 at Unknown time  . colchicine 0.6 MG tablet Take 1 tablet (0.6 mg total) by mouth daily. May decrease to once per day as needed for diarrhea 10 tablet 0 02/24/2018 at Unknown time  . COMBIGAN 0.2-0.5 % ophthalmic solution Place 1 drop into both eyes 2 (two) times daily.  6 02/24/2018 at Unknown time  . conjugated estrogens (PREMARIN) vaginal cream Place vaginally 2 (two) times a week. Apply sparingly ( fingertip)  Twice weekly to vaginal area on Wednesdays and Fridays   02/22/2018 at Unknown time  . dabigatran (PRADAXA) 150 MG CAPS capsule Take 1 capsule (150 mg total) by mouth every 12 (twelve) hours. 180 capsule 3 02/24/2018 at 1100a  . donepezil (ARICEPT) 10 MG tablet TAKE 1 TABLET BY MOUTH EVERYDAY AT BEDTIME (Patient taking differently: Take 10 mg by mouth at bedtime. ) 90 tablet 1 02/23/2018 at Unknown time  . Fluoxetine HCl, PMDD, 10 MG TABS Take 1 tablet (10 mg total) by mouth daily.   02/24/2018 at  Unknown time  . hydrOXYzine (ATARAX/VISTARIL) 25 MG tablet Take 1 tablet (25 mg total) by mouth 3 (three) times daily as needed for itching.   Past Month at Unknown time  . imipramine (TOFRANIL) 50 MG tablet TAKE 2 TABLETS (100 MG TOTAL) BY MOUTH AT BEDTIME. 180 tablet 1 02/23/2018 at Unknown time  . Insulin Glargine (LANTUS SOLOSTAR) 100 UNIT/ML Solostar Pen Inject 40 Units into the skin 2 (two) times daily.   02/24/2018 at Unknown time  . meclizine (ANTIVERT) 25 MG tablet TAKE 1 TABLET THREE TIMES DAILY AS NEEDED FOR DIZZINESS. (Patient taking differently: Take 25 mg by mouth 3 (  three) times daily as needed for nausea. ) 270 tablet 1 Past Month at Unknown time  . montelukast (SINGULAIR) 10 MG tablet TAKE 1 TABLET BY MOUTH AT BEDTIME (Patient taking differently: Take 10 mg by mouth at bedtime. ) 90 tablet 1 02/23/2018 at Unknown time  . nystatin (MYCOSTATIN/NYSTOP) powder Apply topically daily as needed (for rash under breasts).   Past Week at Unknown time  . olopatadine (PATANOL) 0.1 % ophthalmic solution Place 1 drop into both eyes 2 (two) times daily.   02/24/2018 at Unknown time  . omeprazole (PRILOSEC) 40 MG capsule Take 1 capsule (40 mg total) by mouth 2 (two) times daily.   02/24/2018 at Unknown time  . oxyCODONE-acetaminophen (PERCOCET) 10-325 MG tablet Take 1 tablet by mouth 2 (two) times daily as needed for pain (back pain and arthritis).   02/24/2018 at Unknown time  . phenazopyridine (AZO-STANDARD) 95 MG tablet Take 95 mg by mouth 3 (three) times daily as needed for pain.   02/22/2018 at Unknown time  . SUMAtriptan (IMITREX) 5 MG/ACT nasal spray Place 1 spray into the nose every 2 (two) hours as needed for migraine.   2 unknown  . topiramate (TOPAMAX) 100 MG tablet TAKE 1 TABLET BY MOUTH TWICE A DAY (Patient taking differently: Take 100 mg by mouth 2 (two) times daily. ) 180 tablet 1 02/24/2018 at Unknown time  . VENTOLIN HFA 108 (90 Base) MCG/ACT inhaler INHALE 2 PUFFS INTO THE LUNGS EVERY 6 (SIX)  HOURS AS NEEDED FOR WHEEZING OR SHORTNESS OF BREATH. 18 Inhaler 3 unknown  . Vitamin D, Ergocalciferol, (DRISDOL) 50000 units CAPS capsule TAKE 1 CAPSULE (50,000 UNITS TOTAL) BY MOUTH ONCE A WEEK. (Patient taking differently: Take 50,000 Units by mouth every 7 (seven) days. Friday) 12 capsule 1 02/24/2018 at Unknown time  . benzonatate (TESSALON) 100 MG capsule Take 1 capsule (100 mg total) by mouth 2 (two) times daily as needed for cough. (Patient not taking: Reported on 02/24/2018) 20 capsule 1 Not Taking at Unknown time  . Blood Glucose Monitoring Suppl (ACCU-CHEK AVIVA PLUS) w/Device KIT For once daily testing dx E11.9 1 kit 0 Taking  . glucose blood (ONE TOUCH ULTRA TEST) test strip USE TO TEST TWICE DAILY 100 each 5 Taking   Allergies:   Allergies  Allergen Reactions  . Penicillins Other (See Comments)    Itching Has patient had a PCN reaction causing immediate rash, facial/tongue/throat swelling, SOB or lightheadedness with hypotension: no Has patient had a PCN reaction causing severe rash involving mucus membranes or skin necrosis: no Has patient had a PCN reaction that required hospitalization No Has patient had a PCN reaction occurring within the last 10 years: No If all of the above answers are "NO", then may proceed with Cepha  . Fentanyl Itching    Patient just started the patch on 08-08-2011 and she has noted itching, but states that it is helping.  . Sulfonamide Derivatives Hives  . Codeine Itching and Rash    tussionex  Is tolerated by patient, no phenergan dm      Family History  Problem Relation Age of Onset  . Diabetes Mother   . Hypertension Mother   . Arthritis Mother   . Heart failure Mother   . Migraines Mother   . Kidney failure Mother   . Leukemia Father   . Migraines Father   . Kidney failure Father   . Diabetes Sister   . Hypertension Sister   . Diabetes Brother   . Hypertension  Brother   . Hypertension Brother   . Hypertension Brother   .  Hypertension Brother   . Diabetes Brother   . Diabetes Brother   . Diabetes Brother   . Pulmonary embolism Sister   . Migraines Sister   . Kidney failure Brother   . Colon cancer Neg Hx   . Colon polyps Neg Hx    Social History:  reports that she has never smoked. She has never used smokeless tobacco. She reports that she does not drink alcohol or use drugs.  ROS: A complete review of systems was performed.  All systems are negative except for pertinent findings as noted. Review of Systems  Respiratory: Positive for cough and wheezing.   All other systems reviewed and are negative. Reports recent issue and treatment for bronchitis CXR clear   Physical Exam:  Vital signs in last 24 hours: Temp:  [98.1 F (36.7 C)-100.1 F (37.8 C)] 100.1 F (37.8 C) (01/11 0505) Pulse Rate:  [94-116] 101 (01/11 0505) Resp:  [16-35] 20 (01/11 0505) BP: (140-161)/(68-139) 140/83 (01/11 0505) SpO2:  [95 %-100 %] 99 % (01/11 0505) Weight:  [102.5 kg-106.5 kg] 106.5 kg (01/11 0501) General:  Alert and oriented, No acute distress HEENT: Normocephalic, atraumatic Cardiovascular: Regular rate and rhythm Lungs: Regular rate and effort Abdomen: Soft, nontender, nondistended, no abdominal masses Neurologic: Grossly intact  Laboratory Data:  Results for orders placed or performed during the hospital encounter of 02/24/18 (from the past 24 hour(s))  CBC with Differential     Status: Abnormal   Collection Time: 02/24/18 10:57 PM  Result Value Ref Range   WBC 12.8 (H) 4.0 - 10.5 K/uL   RBC 3.65 (L) 3.87 - 5.11 MIL/uL   Hemoglobin 8.8 (L) 12.0 - 15.0 g/dL   HCT 30.5 (L) 36.0 - 46.0 %   MCV 83.6 80.0 - 100.0 fL   MCH 24.1 (L) 26.0 - 34.0 pg   MCHC 28.9 (L) 30.0 - 36.0 g/dL   RDW 15.1 11.5 - 15.5 %   Platelets 427 (H) 150 - 400 K/uL   nRBC 0.0 0.0 - 0.2 %   Neutrophils Relative % 76 %   Neutro Abs 9.7 (H) 1.7 - 7.7 K/uL   Lymphocytes Relative 15 %   Lymphs Abs 1.9 0.7 - 4.0 K/uL   Monocytes  Relative 9 %   Monocytes Absolute 1.2 (H) 0.1 - 1.0 K/uL   Eosinophils Relative 0 %   Eosinophils Absolute 0.0 0.0 - 0.5 K/uL   Basophils Relative 0 %   Basophils Absolute 0.0 0.0 - 0.1 K/uL   Immature Granulocytes 0 %   Abs Immature Granulocytes 0.04 0.00 - 0.07 K/uL  Comprehensive metabolic panel     Status: Abnormal   Collection Time: 02/24/18 10:57 PM  Result Value Ref Range   Sodium 135 135 - 145 mmol/L   Potassium 4.4 3.5 - 5.1 mmol/L   Chloride 105 98 - 111 mmol/L   CO2 21 (L) 22 - 32 mmol/L   Glucose, Bld 189 (H) 70 - 99 mg/dL   BUN 18 8 - 23 mg/dL   Creatinine, Ser 1.29 (H) 0.44 - 1.00 mg/dL   Calcium 8.6 (L) 8.9 - 10.3 mg/dL   Total Protein 6.9 6.5 - 8.1 g/dL   Albumin 3.2 (L) 3.5 - 5.0 g/dL   AST 13 (L) 15 - 41 U/L   ALT 13 0 - 44 U/L   Alkaline Phosphatase 84 38 - 126 U/L   Total Bilirubin 0.2 (L)  0.3 - 1.2 mg/dL   GFR calc non Af Amer 40 (L) >60 mL/min   GFR calc Af Amer 46 (L) >60 mL/min   Anion gap 9 5 - 15  Lipase, blood     Status: None   Collection Time: 02/24/18 10:57 PM  Result Value Ref Range   Lipase 19 11 - 51 U/L  Urinalysis, Routine w reflex microscopic     Status: Abnormal   Collection Time: 02/24/18 11:20 PM  Result Value Ref Range   Color, Urine YELLOW YELLOW   APPearance TURBID (A) CLEAR   Specific Gravity, Urine 1.025 1.005 - 1.030   pH 5.0 5.0 - 8.0   Glucose, UA NEGATIVE NEGATIVE mg/dL   Hgb urine dipstick LARGE (A) NEGATIVE   Bilirubin Urine NEGATIVE NEGATIVE   Ketones, ur NEGATIVE NEGATIVE mg/dL   Protein, ur 100 (A) NEGATIVE mg/dL   Nitrite NEGATIVE NEGATIVE   Leukocytes, UA MODERATE (A) NEGATIVE   RBC / HPF >50 (H) 0 - 5 RBC/hpf   WBC, UA >50 (H) 0 - 5 WBC/hpf   Bacteria, UA RARE (A) NONE SEEN   WBC Clumps PRESENT    Mucus PRESENT   CBC     Status: Abnormal   Collection Time: 02/25/18  6:07 AM  Result Value Ref Range   WBC 13.8 (H) 4.0 - 10.5 K/uL   RBC 3.51 (L) 3.87 - 5.11 MIL/uL   Hemoglobin 8.6 (L) 12.0 - 15.0 g/dL   HCT  29.8 (L) 36.0 - 46.0 %   MCV 84.9 80.0 - 100.0 fL   MCH 24.5 (L) 26.0 - 34.0 pg   MCHC 28.9 (L) 30.0 - 36.0 g/dL   RDW 15.2 11.5 - 15.5 %   Platelets 415 (H) 150 - 400 K/uL   nRBC 0.0 0.0 - 0.2 %  Comprehensive metabolic panel     Status: Abnormal   Collection Time: 02/25/18  6:07 AM  Result Value Ref Range   Sodium 138 135 - 145 mmol/L   Potassium 4.1 3.5 - 5.1 mmol/L   Chloride 106 98 - 111 mmol/L   CO2 23 22 - 32 mmol/L   Glucose, Bld 143 (H) 70 - 99 mg/dL   BUN 19 8 - 23 mg/dL   Creatinine, Ser 1.19 (H) 0.44 - 1.00 mg/dL   Calcium 8.5 (L) 8.9 - 10.3 mg/dL   Total Protein 7.3 6.5 - 8.1 g/dL   Albumin 3.3 (L) 3.5 - 5.0 g/dL   AST 13 (L) 15 - 41 U/L   ALT 11 0 - 44 U/L   Alkaline Phosphatase 80 38 - 126 U/L   Total Bilirubin 0.7 0.3 - 1.2 mg/dL   GFR calc non Af Amer 44 (L) >60 mL/min   GFR calc Af Amer 51 (L) >60 mL/min   Anion gap 9 5 - 15  Hemoglobin A1c     Status: Abnormal   Collection Time: 02/25/18  6:07 AM  Result Value Ref Range   Hgb A1c MFr Bld 8.1 (H) 4.8 - 5.6 %   Mean Plasma Glucose 185.77 mg/dL  Glucose, capillary     Status: Abnormal   Collection Time: 02/25/18  6:07 AM  Result Value Ref Range   Glucose-Capillary 153 (H) 70 - 99 mg/dL   No results found for this or any previous visit (from the past 240 hour(s)). Creatinine: Recent Labs    02/24/18 2257 02/25/18 0607  CREATININE 1.29* 1.19*    Impression/Assessment/plan: Left ureteral stone Left hydronephrosis UTI/sepsis Right lower pole  stone  I discussed with the patient and her family the nature, potential benefits, risks and alternatives to cystoscopy with bilateral RGP and bilateral ureteral stent placement, including side effects of the proposed treatment, the likelihood of the patient achieving the goals of the procedure, and any potential problems that might occur during the procedure or recuperation.  We discussed the rationale for a staged procedure to address the left ureteral stone and  the right lower pole stone after appropriate abx treatment. We discussed the right lower pole stone is on the smaller side and may or may not be located on follow-up ureteroscopy but may be worth a look since she is getting a left stent anyway.  Ureteroscopy is a better option with her history as she can remain on Pradaxa.  All questions answered. Patient elects to proceed.    [name] 02/25/2018, 10:08 AM

## 2018-02-25 NOTE — Anesthesia Procedure Notes (Signed)
Procedure Name: Intubation Date/Time: 02/25/2018 12:48 PM Performed by: Lind Covert, CRNA Pre-anesthesia Checklist: Patient identified, Emergency Drugs available, Suction available, Patient being monitored and Timeout performed Patient Re-evaluated:Patient Re-evaluated prior to induction Oxygen Delivery Method: Circle system utilized Preoxygenation: Pre-oxygenation with 100% oxygen Induction Type: IV induction Laryngoscope Size: Mac and 3 Grade View: Grade I Tube type: Oral Tube size: 7.0 mm Number of attempts: 1 Airway Equipment and Method: Stylet Placement Confirmation: ETT inserted through vocal cords under direct vision,  positive ETCO2 and breath sounds checked- equal and bilateral Secured at: 22 cm Tube secured with: Tape Dental Injury: Teeth and Oropharynx as per pre-operative assessment

## 2018-02-26 DIAGNOSIS — N132 Hydronephrosis with renal and ureteral calculous obstruction: Secondary | ICD-10-CM | POA: Diagnosis present

## 2018-02-26 DIAGNOSIS — N390 Urinary tract infection, site not specified: Secondary | ICD-10-CM

## 2018-02-26 DIAGNOSIS — B9620 Unspecified Escherichia coli [E. coli] as the cause of diseases classified elsewhere: Secondary | ICD-10-CM | POA: Diagnosis present

## 2018-02-26 LAB — BASIC METABOLIC PANEL
Anion gap: 8 (ref 5–15)
BUN: 12 mg/dL (ref 8–23)
CO2: 20 mmol/L — AB (ref 22–32)
Calcium: 8.6 mg/dL — ABNORMAL LOW (ref 8.9–10.3)
Chloride: 109 mmol/L (ref 98–111)
Creatinine, Ser: 1.07 mg/dL — ABNORMAL HIGH (ref 0.44–1.00)
GFR calc non Af Amer: 50 mL/min — ABNORMAL LOW (ref >60)
GFR, EST AFRICAN AMERICAN: 58 mL/min — AB (ref >60)
Glucose, Bld: 184 mg/dL — ABNORMAL HIGH (ref 70–99)
Potassium: 4 mmol/L (ref 3.5–5.1)
SODIUM: 137 mmol/L (ref 135–145)

## 2018-02-26 LAB — CBC
HEMATOCRIT: 29 % — AB (ref 36.0–46.0)
HEMOGLOBIN: 8.4 g/dL — AB (ref 12.0–15.0)
MCH: 24.6 pg — ABNORMAL LOW (ref 26.0–34.0)
MCHC: 29 g/dL — ABNORMAL LOW (ref 30.0–36.0)
MCV: 85 fL (ref 80.0–100.0)
Platelets: 377 10*3/uL (ref 150–400)
RBC: 3.41 MIL/uL — ABNORMAL LOW (ref 3.87–5.11)
RDW: 15.1 % (ref 11.5–15.5)
WBC: 9.4 10*3/uL (ref 4.0–10.5)
nRBC: 0 % (ref 0.0–0.2)

## 2018-02-26 LAB — GLUCOSE, CAPILLARY
GLUCOSE-CAPILLARY: 107 mg/dL — AB (ref 70–99)
GLUCOSE-CAPILLARY: 146 mg/dL — AB (ref 70–99)
Glucose-Capillary: 124 mg/dL — ABNORMAL HIGH (ref 70–99)
Glucose-Capillary: 143 mg/dL — ABNORMAL HIGH (ref 70–99)
Glucose-Capillary: 240 mg/dL — ABNORMAL HIGH (ref 70–99)

## 2018-02-26 MED ORDER — DABIGATRAN ETEXILATE MESYLATE 150 MG PO CAPS
150.0000 mg | ORAL_CAPSULE | Freq: Two times a day (BID) | ORAL | Status: DC
  Administered 2018-02-26 – 2018-02-27 (×2): 150 mg via ORAL
  Filled 2018-02-26 (×2): qty 1

## 2018-02-26 MED ORDER — MENTHOL 3 MG MT LOZG
1.0000 | LOZENGE | OROMUCOSAL | Status: DC | PRN
  Administered 2018-02-26: 3 mg via ORAL
  Filled 2018-02-26: qty 9

## 2018-02-26 MED ORDER — POLYETHYLENE GLYCOL 3350 17 G PO PACK
17.0000 g | PACK | Freq: Every evening | ORAL | Status: DC
  Administered 2018-02-26: 17 g via ORAL
  Filled 2018-02-26: qty 1

## 2018-02-26 MED ORDER — INSULIN ASPART 100 UNIT/ML ~~LOC~~ SOLN
0.0000 [IU] | Freq: Three times a day (TID) | SUBCUTANEOUS | Status: DC
  Administered 2018-02-26 (×2): 1 [IU] via SUBCUTANEOUS

## 2018-02-26 MED ORDER — LACTULOSE 10 GM/15ML PO SOLN
30.0000 g | Freq: Once | ORAL | Status: AC
  Administered 2018-02-26: 30 g via ORAL
  Filled 2018-02-26: qty 45

## 2018-02-26 MED ORDER — ALUM & MAG HYDROXIDE-SIMETH 200-200-20 MG/5ML PO SUSP
30.0000 mL | ORAL | Status: DC | PRN
  Administered 2018-02-26: 30 mL via ORAL
  Filled 2018-02-26: qty 30

## 2018-02-26 NOTE — Progress Notes (Addendum)
1 Day Post-Op Subjective: Patient reports she feels better. Some bladder spasms. Fever and tachycardia resolved. Urine cx > 100 k GNR.   Objective: Vital signs in last 24 hours: Temp:  [98.6 F (37 C)-99.8 F (37.7 C)] 98.6 F (37 C) (01/12 0445) Pulse Rate:  [79-107] 94 (01/12 0458) Resp:  [14-20] 14 (01/12 0445) BP: (142-171)/(77-95) 142/77 (01/12 0445) SpO2:  [94 %-100 %] 96 % (01/12 0458)  Intake/Output from previous day: 01/11 0701 - 01/12 0700 In: 2485.6 [P.O.:520; I.V.:1865.6; IV Piggyback:100] Out: 2110 [Urine:2110] Intake/Output this shift: No intake/output data recorded.  Physical Exam:  NAD Sitting in a chair watching TV Urine light pink   Lab Results: Recent Labs    02/24/18 2257 02/25/18 0607  HGB 8.8* 8.6*  HCT 30.5* 29.8*   BMET Recent Labs    02/24/18 2257 02/25/18 0607  NA 135 138  K 4.4 4.1  CL 105 106  CO2 21* 23  GLUCOSE 189* 143*  BUN 18 19  CREATININE 1.29* 1.19*  CALCIUM 8.6* 8.5*   No results for input(s): LABPT, INR in the last 72 hours. No results for input(s): LABURIN in the last 72 hours. Results for orders placed or performed during the hospital encounter of 02/24/18  Urine culture     Status: Abnormal (Preliminary result)   Collection Time: 02/25/18 12:57 AM  Result Value Ref Range Status   Specimen Description URINE, CATHETERIZED  Final   Special Requests   Final    NONE Performed at [facility], [address], [place], [place] [address]    Culture >=100,000 COLONIES/mL GRAM NEGATIVE RODS (A)  Final   Report Status PENDING  Incomplete  Surgical PCR screen     Status: None   Collection Time: 02/25/18 10:40 AM  Result Value Ref Range Status   MRSA, PCR NEGATIVE NEGATIVE Final   Staphylococcus aureus NEGATIVE NEGATIVE Final    Comment: (NOTE) The Xpert SA Assay (FDA approved for NASAL specimens in patients 41 years of age and older), is one component of a comprehensive surveillance program. It is not intended to  diagnose infection nor to guide or monitor treatment. Performed at [facility], [place] [address] [place], [place] [address]     Studies/Results: Dg Chest 2 View  Result Date: 02/25/2018 CLINICAL DATA:  Cough and fever EXAM: CHEST - 2 VIEW COMPARISON:  01/24/2018, CT 01/24/2018 FINDINGS: Borderline to mild cardiomegaly with aortic atherosclerosis. No acute consolidation or effusion. No pneumothorax. IMPRESSION: No active cardiopulmonary disease.  Borderline to mild cardiomegaly. Electronically Signed   By: Donavan Foil M.D.   On: 02/25/2018 00:21   Ct Abdomen Pelvis W Contrast  Result Date: 02/25/2018 CLINICAL DATA:  78 y/o  F; hematuria, dysuria, fever. EXAM: CT ABDOMEN AND PELVIS WITH CONTRAST TECHNIQUE: Multidetector CT imaging of the abdomen and pelvis was performed using the standard protocol following bolus administration of intravenous contrast. CONTRAST:  67mL ISOVUE-300 IOPAMIDOL (ISOVUE-300) INJECTION 61% COMPARISON:  07/17/2010 CT abdomen and pelvis FINDINGS: Lower chest: No acute abnormality. Hepatobiliary: No focal liver abnormality is seen. No gallstones, gallbladder wall thickening, or biliary dilatation. Pancreas: Unremarkable. No pancreatic ductal dilatation or surrounding inflammatory changes. Spleen: Normal in size without focal abnormality. Adrenals/Urinary Tract: Normal adrenal glands. 4 mm right kidney lower pole stone. Multiple bilateral renal cysts measuring up to 19 mm in the upper pole of left kidney. No right hydronephrosis. Mild left hydronephrosis with 3 mm stone in the distal left ureter in left hemipelvis (series 5, image 82).  Normal bladder. Stomach/Bowel: Stomach is within normal limits. Appendix appears normal. No evidence of bowel wall thickening, distention, or inflammatory changes. Mild sigmoid diverticulosis. Vascular/Lymphatic: No significant vascular findings are present. No enlarged abdominal or pelvic lymph nodes. Reproductive: Uterus and  bilateral adnexa are unremarkable. Other: No abdominal wall hernia or abnormality. No abdominopelvic ascites. Musculoskeletal: Lumbar spondylosis with advanced facet arthropathy greatest at L4-5 where there is grade 1 anterolisthesis. No acute osseous abnormality identified. IMPRESSION: 1. Mild left hydronephrosis with 3 mm stone in distal left ureter in left hemipelvis. 2. Right kidney lower pole 4 mm nonobstructing stone. 3. Mild sigmoid diverticulosis. 4. Lumbar spondylosis with advanced facet arthropathy greatest at L4-5 where there is grade 1 anterolisthesis. Electronically Signed   By: Kristine Garbe M.D.   On: 02/25/2018 00:43   Dg C-arm 1-60 Min-no Report  Result Date: 02/25/2018 Fluoroscopy was utilized by the requesting physician.  No radiographic interpretation.    Assessment/Plan:  -UTI/sepsis - improved. Urine cx growing > 100K e coli  Right renal stone, left ureteral stone - s/p stents. Plan ureteroscopy in a couple of weeks or so. My office will call. D/c foley.   Bladder erythema/gross hematuria - s/p bladder bx and fulguration  Vaginal bleeding - pt again reports she thinks she may have vaginal bleeding over the past year or a "period". I told her and her son the importance of Gyn f/u and evaluation.    LOS: 1 day   [name] 02/26/2018, 9:33 AM

## 2018-02-26 NOTE — Progress Notes (Signed)
Pt had extremely large, brown, soft BM his shift. States she feels "much better" now.

## 2018-02-26 NOTE — Progress Notes (Signed)
Patient Demographics:    [name], is a 78 y.o. female, DOB - 27-Apr-1940, XLK:[id number]  Admit date - 02/24/2018   Admitting Physician Etta Quill, DO  Outpatient Primary MD for the patient is [name], MD  LOS - 1   Chief Complaint  Patient presents with  . Hematuria    fever        Subjective:    [name] today has no fevers, no emesis,  No chest pain, no further hematuria, flank and abdominal pain is improving, patient complains of constipation son at bedside questions answered  Assessment  & Plan :    Active Problems:   Hematuria  Brief Summary 78 y.o.female,with historyofdiabetes mellitus type 2, hypertension, pulmonary embolism on chronic anticoagulation with Pradaxa since 2012, dyslipidemia admitted on 02/25/2018 and transferred from [facility] to [facility] hospital for urological intervention due to hematuria with finding of nephrolithiasis with hydronephrosis   Plan:- 1)Nephrolithiasis with hematuria and hydronephrosis-----urology consult from Dr. Junious Silk appreciated, on 02/25/2018  patient underwent Cystoscopy, bilateral retrograde pyelogram, bilateral ureteral stent placement; Bladder biopsy fulguration 0.5 to 2 cm--- continue IV Rocephin pending activities of E. coli  2)H/o PE--- diagnosed in 2012,  resume Pradaxa   3)  complicated E. coli UTI--- in the setting of nephrolithiasis and hydronephrosis, continue IV Rocephin, per urologist will need to await  sensitivity of urine culture prior to discontinuing IV antibiotics and switching to oral  4)DM2--A1c is 8.1 reflecting poorly controlled diabetes mellitus,  resume Lantus 40 units subcu twice daily , Use Novolog/Humalog Sliding scale insulin with Accu-Cheks/Fingersticks as ordered   5)HTN--- stable, continue current meds  6)history of chronic normocytic and hypochromic anemia----baseline  hemoglobin usually between 9 and 10 hemoglobin currently down to 8.4 monitor closely and transfuse as clinically indicated  7) constipation--lactulose x1 dose, MiraLAX daily  Disposition/Need for in-Hospital Stay- patient unable to be discharged at this time due to per urologist will need to await  sensitivity of urine culture prior to discontinuing IV antibiotics and switching to oral  Code Status : DNR  Family Communication:   Son at bedisde   Disposition Plan  : home   Consults  :  Urology  DVT Prophylaxis  : Pradaxa  Lab Results  Component Value Date   PLT 377 02/26/2018    Inpatient Medications  Scheduled Meds: . acyclovir  400 mg Oral TID  . amLODipine  10 mg Oral Daily  . atorvastatin  40 mg Oral Daily  . brimonidine  1 drop Both Eyes BID   And  . timolol  1 drop Both Eyes BID  . busPIRone  7.5 mg Oral TID  . cloNIDine  0.2 mg Oral TID  . colchicine  0.6 mg Oral Daily  . dabigatran  150 mg Oral Q12H  . donepezil  10 mg Oral QHS  . FLUoxetine  10 mg Oral Daily  . imipramine  100 mg Oral QHS  . insulin aspart  0-9 Units Subcutaneous TID WC  . insulin glargine  40 Units Subcutaneous BID  . montelukast  10 mg Oral QHS  . olopatadine  1 drop Both Eyes BID  . pantoprazole  40 mg Oral Daily  . polyethylene glycol  17 g Oral QPM  .  topiramate  100 mg Oral BID   Continuous Infusions: . sodium chloride 100 mL/hr at 02/26/18 0254  . cefTRIAXone (ROCEPHIN)  IV 1 g (02/25/18 2326)   PRN Meds:.acetaminophen, albuterol, alum & mag hydroxide-simeth, hydrOXYzine, morphine injection, ondansetron **OR** ondansetron (ZOFRAN) IV, oxyCODONE    Anti-infectives (From admission, onward)   Start     Dose/Rate Route Frequency Ordered Stop   02/25/18 2300  cefTRIAXone (ROCEPHIN) 1 g in sodium chloride 0.9 % 100 mL IVPB     1 g 200 mL/hr over 30 Minutes Intravenous Every 24 hours 02/25/18 0523     02/25/18 1330  cefTRIAXone (ROCEPHIN) 1 g in sodium chloride 0.9 % 100 mL IVPB      1 g 200 mL/hr over 30 Minutes Intravenous  Once 02/25/18 1330 02/25/18 1330   02/25/18 1000  acyclovir (ZOVIRAX) tablet 400 mg     400 mg Oral 3 times daily 02/25/18 0523     02/25/18 0000  cefTRIAXone (ROCEPHIN) 1 g in sodium chloride 0.9 % 100 mL IVPB     1 g 200 mL/hr over 30 Minutes Intravenous  Once 02/24/18 2352 02/25/18 0111        Objective:   Vitals:   02/25/18 1500 02/25/18 2045 02/26/18 0445 02/26/18 0458  BP: (!) 154/82 (!) 156/81 (!) 142/77   Pulse: 94 94 79 94  Resp: 14 20 14    Temp: 98.7 F (37.1 C) 99.8 F (37.7 C) 98.6 F (37 C)   TempSrc:  Oral Oral   SpO2: 97% 100% 98% 96%  Weight:      Height:        Wt Readings from Last 3 Encounters:  02/25/18 106.5 kg  01/30/18 107.6 kg  01/24/18 106 kg     Intake/Output Summary (Last 24 hours) at 02/26/2018 1610 Last data filed at 02/26/2018 1200 Gross per 24 hour  Intake 1485.59 ml  Output 2750 ml  Net -1264.41 ml     Physical Exam Patient is examined daily including today on 02/26/18 , exams remain the same as of yesterday except that has changed   Gen:- Awake Alert,  In no apparent distress  HEENT:- [place].AT, No sclera icterus Neck-Supple Neck,No JVD,.  Lungs-  CTAB , fair symmetrical air movement CV- S1, S2 normal, regular  Abd-  +ve B.Sounds, Abd Soft, No tenderness, no CVA area tenderness    Extremity/Skin:- No  edema, pedal pulses present  Psych-affect is appropriate, oriented x3 Neuro-no new focal deficits, no tremors GU-Foley without hematuria  Data Review:   Micro Results Recent Results (from the past 240 hour(s))  Urine culture     Status: Abnormal (Preliminary result)   Collection Time: 02/25/18 12:57 AM  Result Value Ref Range Status   Specimen Description   Final    URINE, CATHETERIZED Performed at [facility], [address] [place], [place] [address]    Special Requests   Final    NONE Performed at [facility], [address], [place], [place] [address]    Culture >=100,000  COLONIES/mL ESCHERICHIA COLI (A)  Final   Report Status PENDING  Incomplete  Surgical PCR screen     Status: None   Collection Time: 02/25/18 10:40 AM  Result Value Ref Range Status   MRSA, PCR NEGATIVE NEGATIVE Final   Staphylococcus aureus NEGATIVE NEGATIVE Final    Comment: (NOTE) The Xpert SA Assay (FDA approved for NASAL specimens in patients 47 years of age and older), is one component of a comprehensive surveillance program. It  is not intended to diagnose infection nor to guide or monitor treatment. Performed at [facility], [place] [address], [place], [place] [address]     Radiology Reports Dg Chest 2 View  Result Date: 02/25/2018 CLINICAL DATA:  Cough and fever EXAM: CHEST - 2 VIEW COMPARISON:  01/24/2018, CT 01/24/2018 FINDINGS: Borderline to mild cardiomegaly with aortic atherosclerosis. No acute consolidation or effusion. No pneumothorax. IMPRESSION: No active cardiopulmonary disease.  Borderline to mild cardiomegaly. Electronically Signed   By: Donavan Foil M.D.   On: 02/25/2018 00:21   Ct Abdomen Pelvis W Contrast  Result Date: 02/25/2018 CLINICAL DATA:  78 y/o  F; hematuria, dysuria, fever. EXAM: CT ABDOMEN AND PELVIS WITH CONTRAST TECHNIQUE: Multidetector CT imaging of the abdomen and pelvis was performed using the standard protocol following bolus administration of intravenous contrast. CONTRAST:  65mL ISOVUE-300 IOPAMIDOL (ISOVUE-300) INJECTION 61% COMPARISON:  07/17/2010 CT abdomen and pelvis FINDINGS: Lower chest: No acute abnormality. Hepatobiliary: No focal liver abnormality is seen. No gallstones, gallbladder wall thickening, or biliary dilatation. Pancreas: Unremarkable. No pancreatic ductal dilatation or surrounding inflammatory changes. Spleen: Normal in size without focal abnormality. Adrenals/Urinary Tract: Normal adrenal glands. 4 mm right kidney lower pole stone. Multiple bilateral renal cysts measuring up to 19 mm in the upper pole of left  kidney. No right hydronephrosis. Mild left hydronephrosis with 3 mm stone in the distal left ureter in left hemipelvis (series 5, image 82). Normal bladder. Stomach/Bowel: Stomach is within normal limits. Appendix appears normal. No evidence of bowel wall thickening, distention, or inflammatory changes. Mild sigmoid diverticulosis. Vascular/Lymphatic: No significant vascular findings are present. No enlarged abdominal or pelvic lymph nodes. Reproductive: Uterus and bilateral adnexa are unremarkable. Other: No abdominal wall hernia or abnormality. No abdominopelvic ascites. Musculoskeletal: Lumbar spondylosis with advanced facet arthropathy greatest at L4-5 where there is grade 1 anterolisthesis. No acute osseous abnormality identified. IMPRESSION: 1. Mild left hydronephrosis with 3 mm stone in distal left ureter in left hemipelvis. 2. Right kidney lower pole 4 mm nonobstructing stone. 3. Mild sigmoid diverticulosis. 4. Lumbar spondylosis with advanced facet arthropathy greatest at L4-5 where there is grade 1 anterolisthesis. Electronically Signed   By: Kristine Garbe M.D.   On: 02/25/2018 00:43   Dg C-arm 1-60 Min-no Report  Result Date: 02/25/2018 Fluoroscopy was utilized by the requesting physician.  No radiographic interpretation.     CBC Recent Labs  Lab 02/24/18 2257 02/25/18 0607 02/26/18 1104  WBC 12.8* 13.8* 9.4  HGB 8.8* 8.6* 8.4*  HCT 30.5* 29.8* 29.0*  PLT 427* 415* 377  MCV 83.6 84.9 85.0  MCH 24.1* 24.5* 24.6*  MCHC 28.9* 28.9* 29.0*  RDW 15.1 15.2 15.1  LYMPHSABS 1.9  --   --   MONOABS 1.2*  --   --   EOSABS 0.0  --   --   BASOSABS 0.0  --   --     Chemistries  Recent Labs  Lab 02/24/18 2257 02/25/18 0607 02/26/18 1104  NA 135 138 137  K 4.4 4.1 4.0  CL 105 106 109  CO2 21* 23 20*  GLUCOSE 189* 143* 184*  BUN 18 19 12   CREATININE 1.29* 1.19* 1.07*  CALCIUM 8.6* 8.5* 8.6*  AST 13* 13*  --   ALT 13 11  --   ALKPHOS 84 80  --   BILITOT 0.2* 0.7  --     ------------------------------------------------------------------------------------------------------------------ No results for input(s): CHOL, HDL, LDLCALC, TRIG, CHOLHDL, LDLDIRECT in the last 72 hours.  Lab Results  Component Value Date   HGBA1C 8.1 (H) 02/25/2018   ------------------------------------------------------------------------------------------------------------------ No results for input(s): TSH, T4TOTAL, T3FREE, THYROIDAB in the last 72 hours.  Invalid input(s): FREET3 ------------------------------------------------------------------------------------------------------------------ No results for input(s): VITAMINB12, FOLATE, FERRITIN, TIBC, IRON, RETICCTPCT in the last 72 hours.  Coagulation profile No results for input(s): INR, PROTIME in the last 168 hours.  No results for input(s): DDIMER in the last 72 hours.  Cardiac Enzymes No results for input(s): CKMB, TROPONINI, MYOGLOBIN in the last 168 hours.  Invalid input(s): CK ------------------------------------------------------------------------------------------------------------------    Component Value Date/Time   BNP 63.0 01/24/2018 2035     Roxan Hockey M.D on 02/26/2018 at 4:10 PM  Go to [email or web address] - for contact info  [facility] - Office  [phone]

## 2018-02-27 LAB — URINE CULTURE: Culture: >=100000 — AB

## 2018-02-27 LAB — GLUCOSE, CAPILLARY
Glucose-Capillary: 109 mg/dL — ABNORMAL HIGH (ref 70–99)
Glucose-Capillary: 111 mg/dL — ABNORMAL HIGH (ref 70–99)

## 2018-02-27 MED ORDER — CEPHALEXIN 500 MG PO CAPS: 500.0000 mg | ORAL_CAPSULE | Freq: Two times a day (BID) | ORAL | Status: DC

## 2018-02-27 MED ORDER — IMIPRAMINE HCL 50 MG PO TABS: 50.0000 mg | ORAL_TABLET | Freq: Every day | ORAL | 1 refills | Status: DC

## 2018-02-27 MED ORDER — SENNOSIDES-DOCUSATE SODIUM 8.6-50 MG PO TABS: 2.0000 | ORAL_TABLET | Freq: Every day | ORAL | 1 refills | Status: AC

## 2018-02-27 MED ORDER — DABIGATRAN ETEXILATE MESYLATE 150 MG PO CAPS: 150.0000 mg | ORAL_CAPSULE | Freq: Two times a day (BID) | ORAL | 3 refills | Status: DC

## 2018-02-27 MED ORDER — AMLODIPINE BESYLATE 10 MG PO TABS: 10.0000 mg | ORAL_TABLET | Freq: Every day | ORAL | 1 refills | Status: DC

## 2018-02-27 MED ORDER — CEPHALEXIN 500 MG PO CAPS: 500.0000 mg | ORAL_CAPSULE | Freq: Two times a day (BID) | ORAL | 0 refills | Status: AC

## 2018-02-27 MED ORDER — ALBUTEROL SULFATE HFA 108 (90 BASE) MCG/ACT IN AERS: 2.0000 | INHALATION_SPRAY | Freq: Four times a day (QID) | RESPIRATORY_TRACT | 3 refills | Status: DC | PRN

## 2018-02-27 MED ORDER — OXYCODONE HCL 5 MG PO TABS: 5.0000 mg | ORAL_TABLET | ORAL | 0 refills | Status: DC | PRN

## 2018-02-27 NOTE — Care Management Note (Signed)
Case Management Note  Patient Details  Name: ANGELYSE HESLIN MRN: [id number] Date of Birth: 05-Sep-1940  Subjective/Objective: Hematuria. Has rw. From home. Dr Orlin Hilding HHPT-patient pleasantly declines any HHC-MD notified. No further CM needs.                   Action/Plan:d/c home.   Expected Discharge Date:  02/27/18               Expected Discharge Plan:  Home/Self Care  In-House Referral:     Discharge planning Services  CM Consult  Post Acute Care Choice:  Durable Medical Equipment(rw) Choice offered to:  Patient  DME Arranged:    DME Agency:     HH Arranged:  Patient Refused Echo Agency:     Status of Service:  Completed, signed off  If discussed at [employer] of Stay Meetings, dates discussed:    Additional Comments:  Dessa Phi, RN 02/27/2018, 11:51 AM

## 2018-02-27 NOTE — Discharge Summary (Signed)
[name], is a 78 y.o. female  DOB 1940/10/06  MRN [id number].  Admission date:  02/24/2018  Admitting Physician  Etta Quill, DO  Discharge Date:  02/27/2018   Primary MD  [name], MD  Recommendations for primary care physician for things to follow:   1) follow-up with urologist Dr. Junious Silk for ureteroscopy in a couple of weeks  2) take medications including Keflex as prescribed 3) use walker for ambulation, avoid falls 4) repeat CBC and BMP blood test when you see Dr. Junious Silk in a couple of weeks  Admission Diagnosis  Acute cystitis with hematuria [N30.01] Left ureteral stone [N20.1]   Discharge Diagnosis  Acute cystitis with hematuria [N30.01] Left ureteral stone [N20.1]    Active Problems:   History of pulmonary embolism   Hematuria   E. coli UTI/complicated UTI with nephrolithiasis   Hydronephrosis with renal and ureteral calculus obstruction      Past Medical History:  Diagnosis Date  . Anemia, iron deficiency 2012   Evaluated by Dr. Oneida Alar; H&H of 9.3/30.8 with [place] in 10/2010; 3/3 positive Hemoccult cards in 07/2011  . Anxiety    was on Prozac for a couple of weeks  . Atrial fibrillation ([place])    takes Coumadin daily  . Bruises easily    takes Coumadin  . Chronic anticoagulation 07/21/2010  . Chronic back pain    related to knee pain  . Degenerative joint disease    Knees  . Depression   . Diabetes mellitus    takes Metformin daily  . Gastroesophageal reflux disease    takes Omeprazole daily  . Glaucoma   . Glaucoma    uses eye drops at night  . History of blood transfusion 1969  . History of gout    was on medication but taken off of several months ago  . Hx of colonic polyps   . Hx of migraines    last one about a yr ago;takes Topamax daily  . Hyperlipidemia    takes Pravastatin daily  . Hypertension    takes Hyzaar daily and Propranolol   .  Insomnia    takes Restoril prn  . Joint pain   . Joint swelling   . Memory loss    takes donepezil  . Migraine   . Migraine   . Nocturia   . Overweight(278.02)   . Pneumonia 1969   hx of  . Pulmonary embolism [facility] May 2012   acute presentation, bilateral PE  . Skin spots-aging   . Urinary frequency     Past Surgical History:  Procedure Laterality Date  . CATARACT EXTRACTION W/PHACO Right 04/11/2012   Procedure: CATARACT EXTRACTION PHACO AND INTRAOCULAR LENS PLACEMENT (IOC);  Surgeon: Elta Guadeloupe T. Gershon Crane, MD;  Location: [facility];  Service: Ophthalmology;  Laterality: Right;  CDE=9.61  . CATARACT EXTRACTION W/PHACO Left 12/26/2012   Procedure: CATARACT EXTRACTION PHACO AND INTRAOCULAR LENS PLACEMENT (IOC);  Surgeon: Elta Guadeloupe T. Gershon Crane, MD;  Location: [facility];  Service: Ophthalmology;  Laterality: Left;  CDE:7.74  . COLONOSCOPY  Jan 2002; 2012   2002: Dr. Tamala Julian, ext. hemorrhoids, nl colon; 2012-adenomatous polyps, gastritis on EGD  . DILATION AND CURETTAGE OF UTERUS    . ESOPHAGOGASTRODUODENOSCOPY  08/24/2011   ESOPHAGOGASTRODUODENOSCOPY; esophageal dilatation; Rogene Houston, MD;  . Freda Munro CAPSULE STUDY  08/18/2011   Procedure: GIVENS CAPSULE STUDY;  Surgeon: Rogene Houston, MD;  Location: [facility];  Service: Endoscopy;  Laterality: N/A;  730  . KNEE ARTHROSCOPY W/ MENISCECTOMY  90's   Left  . ORIF ANKLE FRACTURE Right 90's  . TONSILLECTOMY    . TOTAL KNEE ARTHROPLASTY  10/20/2011   Procedure: TOTAL KNEE ARTHROPLASTY;  Surgeon: Ninetta Lights, MD;  Location: [place];  Service: Orthopedics;  Laterality: Left;  . UPPER GASTROINTESTINAL ENDOSCOPY    . URETHRAL DILATION         HPI  from the history and physical done on the day of admission:     [name]  is a 78 y.o. female, with history of diabetes mellitus type 2, hypertension, pulmonary embolism on chronic anticoagulation with Pradaxa since 2012, dyslipidemia, came to hospital with complaint of blood in the urine and fever.  As  per patient she has been having chronic hematuria with suprapubic pain after being diagnosed with UTI in May.  She has intermittent episodes of hematuria.  Patient was febrile today with T-max 100.8 at home and was confused so family brought her to the hospital for further evaluation. Patient complains of suprapubic pain. Denies nausea vomiting or diarrhea. Complains of intermittent chest pain and shortness of breath. No previous history of stroke or seizures. No history of CAD  In the ED, CT abdomen pelvis showed mild left hydronephrosis with 3 mm stone in the distal left ureter and left hemipelvis.  Right kidney lower pole 4 mm nonobstructing stone. Urology Dr. Junious Silk was consulted by the ED physician and he recommended patient to be transferred to [facility] long in case she needs a procedure.   Hospital Course:   BriefSummary 78 y.o.female,with historyofdiabetes mellitus type 2, hypertension, pulmonary embolism on chronic anticoagulation with Pradaxa since 2012, dyslipidemiaadmitted on 02/25/2018 and transferred [facility] to [facility] hospital for urological intervention due to hematuria with finding of nephrolithiasis with hydronephrosis   Plan:- 1)Nephrolithiasis with Hematuria and Hydronephrosis-----urology consult from Dr. Junious Silk appreciated,on 1/11/2020patient underwent Cystoscopy, bilateral retrograde pyelogram, bilateral ureteral stent placement; Bladder biopsy fulguration 0.5 to 2 cm---treated with IV Rocephin , discharged on Keflex for E. coli  2)H/o PE---diagnosed in 2012,  resume Pradaxa   3) complicated E. coli UTI--- in the setting of nephrolithiasis and hydronephrosis, treated with IV Rocephin , discharged on Keflex for E. coli  4)DM2--A1c is 8.1 reflecting poorly controlled diabetes mellitus,  resume Lantus 40 units subcu twice daily ,    5)HTN---stable, continue current meds  6)history of chronic normocytic and hypochromic  anemia----baseline hemoglobin usually between 9 and 10 hemoglobin currently down to 8.4 -repeat with PCP  7) constipation--lactulose x1 dose, had a couple bowel movements, continue MiraLAX daily   Code Status : DNR  Family Communication:   Son at bedisde   Disposition Plan  : home   Consults  :  Urology  DVT Prophylaxis  : Pradaxa  Discharge Condition: stable  Follow UP  Follow-up Information    Call [name], MD.   Specialty:  Urology Why:  for stent removal/stone removal procedure planning  Contact information: [place] [address] [phone]  Diet and Activity recommendation:  As advised  Discharge Instructions    Discharge Instructions    Call MD for:  difficulty breathing, headache or visual disturbances   Complete by:  As directed    Call MD for:  hives   Complete by:  As directed    Call MD for:  persistant dizziness or light-headedness   Complete by:  As directed    Call MD for:  persistant nausea and vomiting   Complete by:  As directed    Call MD for:  severe uncontrolled pain   Complete by:  As directed    Call MD for:  temperature >100.4   Complete by:  As directed    Diet - low sodium heart healthy   Complete by:  As directed    Diet Carb Modified   Complete by:  As directed    Discharge instructions   Complete by:  As directed    1) follow-up with urologist Dr. Junious Silk for ureteroscopy in a couple of weeks  2) take medications including Keflex as prescribed 3) use walker for ambulation, avoid falls 4) repeat CBC and BMP blood test when you see Dr. Junious Silk in a couple of weeks   Increase activity slowly   Complete by:  As directed         Discharge Medications     See med recon  Major procedures and Radiology Reports - PLEASE review detailed and final reports for all details, in brief -    Dg Chest 2 View  Result Date: 02/25/2018 CLINICAL DATA:  Cough and fever EXAM: CHEST - 2 VIEW  COMPARISON:  01/24/2018, CT 01/24/2018 FINDINGS: Borderline to mild cardiomegaly with aortic atherosclerosis. No acute consolidation or effusion. No pneumothorax. IMPRESSION: No active cardiopulmonary disease.  Borderline to mild cardiomegaly. Electronically Signed   By: Donavan Foil M.D.   On: 02/25/2018 00:21   Ct Abdomen Pelvis W Contrast  Result Date: 02/25/2018 CLINICAL DATA:  78 y/o  F; hematuria, dysuria, fever. EXAM: CT ABDOMEN AND PELVIS WITH CONTRAST TECHNIQUE: Multidetector CT imaging of the abdomen and pelvis was performed using the standard protocol following bolus administration of intravenous contrast. CONTRAST:  68mL ISOVUE-300 IOPAMIDOL (ISOVUE-300) INJECTION 61% COMPARISON:  07/17/2010 CT abdomen and pelvis FINDINGS: Lower chest: No acute abnormality. Hepatobiliary: No focal liver abnormality is seen. No gallstones, gallbladder wall thickening, or biliary dilatation. Pancreas: Unremarkable. No pancreatic ductal dilatation or surrounding inflammatory changes. Spleen: Normal in size without focal abnormality. Adrenals/Urinary Tract: Normal adrenal glands. 4 mm right kidney lower pole stone. Multiple bilateral renal cysts measuring up to 19 mm in the upper pole of left kidney. No right hydronephrosis. Mild left hydronephrosis with 3 mm stone in the distal left ureter in left hemipelvis (series 5, image 82). Normal bladder. Stomach/Bowel: Stomach is within normal limits. Appendix appears normal. No evidence of bowel wall thickening, distention, or inflammatory changes. Mild sigmoid diverticulosis. Vascular/Lymphatic: No significant vascular findings are present. No enlarged abdominal or pelvic lymph nodes. Reproductive: Uterus and bilateral adnexa are unremarkable. Other: No abdominal wall hernia or abnormality. No abdominopelvic ascites. Musculoskeletal: Lumbar spondylosis with advanced facet arthropathy greatest at L4-5 where there is grade 1 anterolisthesis. No acute osseous abnormality  identified. IMPRESSION: 1. Mild left hydronephrosis with 3 mm stone in distal left ureter in left hemipelvis. 2. Right kidney lower pole 4 mm nonobstructing stone. 3. Mild sigmoid diverticulosis. 4. Lumbar spondylosis with advanced facet arthropathy greatest at L4-5 where there is grade 1 anterolisthesis. Electronically Signed  By: Kristine Garbe M.D.   On: 02/25/2018 00:43   Dg C-arm 1-60 Min-no Report  Result Date: 02/25/2018 Fluoroscopy was utilized by the requesting physician.  No radiographic interpretation.    Micro Results    Recent Results (from the past 240 hour(s))  Urine culture     Status: Abnormal   Collection Time: 02/25/18 12:57 AM  Result Value Ref Range Status   Specimen Description   Final    URINE, CATHETERIZED Performed at [facility], [address], [place], [place] [address]    Special Requests   Final    NONE Performed at [facility], [address] [place], [place] [address]    Culture >=100,000 COLONIES/mL ESCHERICHIA COLI (A)  Final   Report Status 02/27/2018 FINAL  Final   Organism ID, Bacteria ESCHERICHIA COLI (A)  Final      Susceptibility   Escherichia coli - MIC*    AMPICILLIN >=32 RESISTANT Resistant     CEFAZOLIN <=4 SENSITIVE Sensitive     CEFTRIAXONE <=1 SENSITIVE Sensitive     CIPROFLOXACIN 1 SENSITIVE Sensitive     GENTAMICIN <=1 SENSITIVE Sensitive     IMIPENEM <=0.25 SENSITIVE Sensitive     NITROFURANTOIN <=16 SENSITIVE Sensitive     TRIMETH/SULFA <=20 SENSITIVE Sensitive     AMPICILLIN/SULBACTAM 16 INTERMEDIATE Intermediate     PIP/TAZO <=4 SENSITIVE Sensitive     Extended ESBL NEGATIVE Sensitive     * >=100,000 COLONIES/mL ESCHERICHIA COLI  Surgical PCR screen     Status: None   Collection Time: 02/25/18 10:40 AM  Result Value Ref Range Status   MRSA, PCR NEGATIVE NEGATIVE Final   Staphylococcus aureus NEGATIVE NEGATIVE Final    Comment: (NOTE) The Xpert SA Assay (FDA approved for NASAL specimens in patients 64 years  of age and older), is one component of a comprehensive surveillance program. It is not intended to diagnose infection nor to guide or monitor treatment. Performed at [facility], [place] [address] [place], [place] [address]        Today   Subjective    [name] today has no new complaints, voiding well, no fever no chills, No nausea, vomiting, diarrhea         Patient has been seen and examined prior to discharge   Objective   Blood pressure (!) 145/67, pulse 80, temperature 98.2 F (36.8 C), temperature source Oral, resp. rate 20, height 5\' 7"  (1.702 m), weight 106.5 kg, SpO2 92 %.   Intake/Output Summary (Last 24 hours) at 02/27/2018 1200 Last data filed at 02/27/2018 0600 Gross per 24 hour  Intake 2407.83 ml  Output 1000 ml  Net 1407.83 ml    Exam Gen:- Awake Alert, no acute distress  HEENT:- [place].AT, No sclera icterus Neck-Supple Neck,No JVD,.  Lungs-  CTAB , good air movement bilaterally  CV- S1, S2 normal, regular Abd-  +ve B.Sounds, Abd Soft, No tenderness, no CVA area tenderness Extremity/Skin:- No  edema,   good pulses Psych-affect is appropriate, oriented x3 Neuro-no new focal deficits, no tremors    Data Review   CBC w Diff:  Lab Results  Component Value Date   WBC 9.4 02/26/2018   HGB 8.4 (L) 02/26/2018   HCT 29.0 (L) 02/26/2018   PLT 377 02/26/2018   LYMPHOPCT 15 02/24/2018   MONOPCT 9 02/24/2018   EOSPCT 0 02/24/2018   BASOPCT 0 02/24/2018    CMP:  Lab Results  Component Value Date   NA 137 02/26/2018  K 4.0 02/26/2018   CL 109 02/26/2018   CO2 20 (L) 02/26/2018   BUN 12 02/26/2018   CREATININE 1.07 (H) 02/26/2018   CREATININE 1.15 (H) 12/21/2017   PROT 7.3 02/25/2018   ALBUMIN 3.3 (L) 02/25/2018   BILITOT 0.7 02/25/2018   ALKPHOS 80 02/25/2018   AST 13 (L) 02/25/2018   ALT 11 02/25/2018  .   Total Discharge time is about 33 minutes  Roxan Hockey M.D on 02/27/2018 at 12:00 PM  Go to [email or web address] -   for contact info  [facility] - Office  [phone]

## 2018-02-27 NOTE — Discharge Instructions (Signed)
1) follow-up with urologist Dr. Junious Silk for ureteroscopy in a couple of weeks  2) take medications including Keflex as prescribed 3) use walker for ambulation, avoid falls 4) repeat CBC and BMP blood test when you see Dr. Junious Silk in a couple of weeks  Ureteral Stent Implantation, Care After Refer to this sheet in the next few weeks. These instructions provide you with information about caring for yourself after your procedure. Your health care provider may also give you more specific instructions. Your treatment has been planned according to current medical practices, but problems sometimes occur. Call your health care provider if you have any problems or questions after your procedure.  Be sure to follow-up with Dr. Junious Silk to plan stent/stone removal  What can I expect after the procedure? After the procedure, it is common to have:  Nausea.  Mild pain when you urinate. You may feel this pain in your lower back or lower abdomen. Pain should stop within a few minutes after you urinate. This may last for up to 1 week.  A small amount of blood in your urine for several days. Follow these instructions at home:  Medicines  Take over-the-counter and prescription medicines only as told by your health care provider.  If you were prescribed an antibiotic medicine, take it as told by your health care provider. Do not stop taking the antibiotic even if you start to feel better.  Do not drive for 24 hours if you received a sedative.  Do not drive or operate heavy machinery while taking prescription pain medicines. Activity  Return to your normal activities as told by your health care provider. Ask your health care provider what activities are safe for you.  Do not lift anything that is heavier than 10 lb (4.5 kg). Follow this limit for 1 week after your procedure, or for as long as told by your health care provider. General instructions  Watch for any blood in your urine. Call your health  care provider if the amount of blood in your urine increases.  If you have a catheter: ? Follow instructions from your health care provider about taking care of your catheter and collection bag. ? Do not take baths, swim, or use a hot tub until your health care provider approves.  Drink enough fluid to keep your urine clear or pale yellow.  Keep all follow-up visits as told by your health care provider. This is important. Contact a health care provider if:  You have pain that gets worse or does not get better with medicine, especially pain when you urinate.  You have difficulty urinating.  You feel nauseous or you vomit repeatedly during a period of more than 2 days after the procedure. Get help right away if:  Your urine is dark red or has blood clots in it.  You are leaking urine (have incontinence).  The end of the stent comes out of your urethra.  You cannot urinate.  You have sudden, sharp, or severe pain in your abdomen or lower back.  You have a fever. This information is not intended to replace advice given to you by your health care provider. Make sure you discuss any questions you have with your health care provider. Document Released: 10/04/2012 Document Revised: 07/10/2015 Document Reviewed: 08/16/2014 Elsevier Interactive Patient Education  2019 [place].   1) follow-up with urologist Dr. Junious Silk for ureteroscopy in a couple of weeks  2) take medications including Keflex as prescribed 3) use walker for ambulation, avoid falls  4) repeat CBC and BMP blood test when you see Dr. Junious Silk in a couple of weeks

## 2018-02-28 ENCOUNTER — Telehealth: Payer: Self-pay

## 2018-02-28 ENCOUNTER — Ambulatory Visit: Payer: Self-pay | Admitting: Urology

## 2018-02-28 NOTE — Telephone Encounter (Signed)
Transition Care Management Follow-up Telephone Call   Date discharged? 02-27-18             How have you been since you were released from the hospital? She is alright (spoke with sister [name])   Do you understand why you were in the hospital? Kidney Stones , doctor put in stents because kidneys were so infected. Will go back to urology in a couple of weeks to remove stones after infection is better.   Do you understand the discharge instructions? Yes, is taking Keflex for the infection    Where were you discharged to? Home    Items Reviewed:  Medications reviewed: Yes   Allergies reviewed: Yes   Dietary changes reviewed: low carb, heart healthy   Referrals reviewed: Yes    Functional Questionnaire:   Activities of Daily Living (ADLs):  Sister is there to help her     Any transportation issues/concerns?: None    Any patient concerns? No concerns as of yet, will call if they need anything. Seems better just from having the stents put in     Confirmed importance and date/time of follow-up visits scheduled Scheduled appointment with doctor Moshe Cipro on the 29th. Urologist stated to patient that is appointment would be fine for a follow up also      Confirmed with patient if condition begins to worsen call PCP or go to the ER.  Patient was given the office number and encouraged to call back with question or concerns.  :

## 2018-03-03 ENCOUNTER — Other Ambulatory Visit: Payer: Self-pay | Admitting: Urology

## 2018-03-07 ENCOUNTER — Other Ambulatory Visit: Payer: Self-pay | Admitting: Pharmacy Technician

## 2018-03-07 NOTE — Patient Outreach (Signed)
[facility]) Care Management  03/07/2018  CHARA MARQUARD 29-Jan-1941 [id number]    Successful call placed to patients sister, [name] regarding patient assistance application(s) for Pradaxa , HIPAA identifiers verified. [name] was unable to confirm if patient assistance application had been received due to it being mailed to Ms. Gallery's daughters home. [name] took and message and stated she would follow up as well as provided me with a good time to contact Ms. Cobbs daughter which is in the mornings between 10am and 1pm.  Will make follow up call in 2-3 business days if call has not been returned.  Maud Deed Chana Bode [name] Certified Pharmacy Technician [place] Management Direct [phone]

## 2018-03-14 ENCOUNTER — Encounter (HOSPITAL_BASED_OUTPATIENT_CLINIC_OR_DEPARTMENT_OTHER): Payer: Self-pay | Admitting: *Deleted

## 2018-03-14 ENCOUNTER — Other Ambulatory Visit: Payer: Self-pay

## 2018-03-14 NOTE — Progress Notes (Signed)
Spoke with sister [name] for pre op interview per patient request. NPO after MN. Patient to take catapre, buspar and norvasc AM of surgery with a sip of water. EKG on chart and in Epic. Arrival time 0530.

## 2018-03-15 ENCOUNTER — Ambulatory Visit (INDEPENDENT_AMBULATORY_CARE_PROVIDER_SITE_OTHER): Payer: Medicare HMO | Admitting: Family Medicine

## 2018-03-15 ENCOUNTER — Encounter: Payer: Self-pay | Admitting: Family Medicine

## 2018-03-15 VITALS — BP 138/74 | HR 120 | Resp 18 | Ht 66.0 in | Wt 234.0 lb

## 2018-03-15 DIAGNOSIS — B9620 Unspecified Escherichia coli [E. coli] as the cause of diseases classified elsewhere: Secondary | ICD-10-CM

## 2018-03-15 DIAGNOSIS — I10 Essential (primary) hypertension: Secondary | ICD-10-CM | POA: Diagnosis not present

## 2018-03-15 DIAGNOSIS — J40 Bronchitis, not specified as acute or chronic: Secondary | ICD-10-CM

## 2018-03-15 DIAGNOSIS — N132 Hydronephrosis with renal and ureteral calculous obstruction: Secondary | ICD-10-CM | POA: Diagnosis not present

## 2018-03-15 DIAGNOSIS — N390 Urinary tract infection, site not specified: Secondary | ICD-10-CM | POA: Diagnosis not present

## 2018-03-15 DIAGNOSIS — Z09 Encounter for follow-up examination after completed treatment for conditions other than malignant neoplasm: Secondary | ICD-10-CM | POA: Diagnosis not present

## 2018-03-15 DIAGNOSIS — M159 Polyosteoarthritis, unspecified: Secondary | ICD-10-CM | POA: Diagnosis not present

## 2018-03-15 DIAGNOSIS — R110 Nausea: Secondary | ICD-10-CM

## 2018-03-15 MED ORDER — HYDROCOD POLST-CPM POLST ER 10-8 MG/5ML PO SUER: ORAL | 0 refills | Status: DC

## 2018-03-15 MED ORDER — ONDANSETRON HCL 4 MG/2ML IJ SOLN
4.0000 mg | Freq: Once | INTRAMUSCULAR | Status: AC
  Administered 2018-03-15: 4 mg via INTRAMUSCULAR

## 2018-03-15 MED ORDER — OXYCODONE HCL 5 MG PO TABS: ORAL_TABLET | ORAL | 0 refills | Status: DC

## 2018-03-15 NOTE — Progress Notes (Addendum)
[name]     MRN: [id number]      DOB: [birth year]   HPI [name] is here for follow up of recent hospitalization at  [facility] from 01/10 to 01./13/2020when she was hospitalized with a acute cystitis with hematuria , dx of E coliuTI complicated with nephro;lithiasis, also hydronephrosis with both renal and ureteral calculus onbstruction and was discharged with ureteral stent, has scheduled stent removal next week, is being seen in follow up C/o nausea, and early afternoon uncontrolled pain, though she was prescribed short acting medication by urology at d/c pharmacy did not fill and family had not contacted me  C/o nausea and hard stool, not constipated but hard. I explained this is aggravated by reduced mobility and pain medication, she is to take 2 to 4 stool softneers and  Eat foods which promote BM C/o cough and congestion, requests tussionex cough syrup for bedtime use, no other medication works reportedly No recent fever or chills Requests hospital bed due to chronic pain which is caused by severe osteoarthritis and disc disease of lumbar and cervical spine, as well as hips and knees. Hospital bed will alleviate pain by allowing spine, hips and knees to be positioned in ways not feasible with a normal bed. Pain episodes frequently frequently require frequent changes in body position which cannot be achieved with a normal bed. ROS Denies recent fever or chills. Denies sinus pressure, nasal congestion, ear pain or sore throat.  Denies chest pains, palpitations and leg swelling Denies abdominal pain, nausea, vomiting,diarrhea or constipation.   Denies dysuria, frequency, hesitancy or incontinence. C/o chronic  joint pain, swelling and limitation in mobility. Denies headaches, seizures, numbness, or tingling. Denies depression, anxiety or insomnia. Denies skin break down or rash.   PE  BP 138/74   Pulse (!) 120   Resp 18   Ht 5\' 6"  (1.676 m)   Wt 234 lb 0.6 oz (106.2 kg)    SpO2 94% Comment: room air  BMI 37.78 kg/m   Patient alert and oriented and in no cardiopulmonary distress.  HEENT: No facial asymmetry, EOMI,   oropharynx pink and moist.  Neck supple no JVD, no mass.  Chest: Decreased air entry , though adequate, few crackles, no wheezes  CVS: S1, S2 no murmurs, no S3.Regular rate.  ABD: Soft no guarding or rebound.   Ext: No edema  MS: Decreased  ROM spine, shoulders, hips and knees.  Skin: Intact, no ulcerations or rash noted.  Psych: Good eye contact, normal affect. Memory intact not anxious or depressed appearing.  CNS: CN 2-12 intact, power,  normal throughout.no focal deficits noted.   [facility] discharge follow-up Patient in for follow up of recent hospitalization. Discharge summary, and laboratory and radiology data are reviewed, and any questions or concerns about recent hospitalization are discussed. Specific issues requiring follow up are specifically addressed.Repeat labs ordered and reviewed, will need empiricantibiotic treatment and CXR and UA with reflex culture requested on lab review   E. coli UTI/complicated UTI with nephrolithiasis rept cbc , has elevated WBC, needs to submit CCUA with reflex c/s , doxycycline empirically to be  prescribed, has completed keflex and has upcoming ureteral stent removal  Essential hypertension Controlled, no change in medication   Nausea Zofran 4 mg Im in office, denies loose stool, has had no emesis  Hydronephrosis with renal and ureteral calculus obstruction C/o increased back and suprapubic pain, additional oxy ir atr mid day prescribed, plan is for short  term intervention only  Generalized osteoarthritis Pt has established severe arthritis in l;umbar and cervical spine as well as her hips and knees which cause debilitating pain, which  Disturbs her sleep,. She requires frequent changes in position to help alleviate this and tis cannot be achieved with a normal  bed. A hospital bed is indicated

## 2018-03-15 NOTE — Assessment & Plan Note (Addendum)
Patient in for follow up of recent hospitalization. Discharge summary, and laboratory and radiology data are reviewed, and any questions or concerns about recent hospitalization are discussed. Specific issues requiring follow up are specifically addressed.Repeat labs ordered and reviewed, will need empiricantibiotic treatment and CXR and UA with reflex culture requested on lab review

## 2018-03-15 NOTE — Patient Instructions (Addendum)
Follow up for pain management visit week of Feb 20, call if you need me before  New additional tablet for pain, is oxycodone 5 mg one at lunch time  Zofran 4 mg iM today for nausea  CBC, chem 7 and EGFR today   PLEASE take stool softener colace 2 to 4 tablets EVERY day to help bowel movements stay soft  Best wishes for surgery next week, you will feel better after it is over  Cough syrup is prescribed for bedtime use if needed.  Hospital bed will be requested  Use walker all the time please

## 2018-03-16 ENCOUNTER — Other Ambulatory Visit: Payer: Self-pay | Admitting: Family Medicine

## 2018-03-16 ENCOUNTER — Encounter: Payer: Self-pay | Admitting: Family Medicine

## 2018-03-16 ENCOUNTER — Telehealth: Payer: Self-pay

## 2018-03-16 DIAGNOSIS — M159 Polyosteoarthritis, unspecified: Secondary | ICD-10-CM | POA: Insufficient documentation

## 2018-03-16 DIAGNOSIS — D72829 Elevated white blood cell count, unspecified: Secondary | ICD-10-CM

## 2018-03-16 DIAGNOSIS — R05 Cough: Secondary | ICD-10-CM

## 2018-03-16 DIAGNOSIS — R059 Cough, unspecified: Secondary | ICD-10-CM

## 2018-03-16 DIAGNOSIS — R300 Dysuria: Secondary | ICD-10-CM

## 2018-03-16 LAB — CBC
HCT: 29.2 % — ABNORMAL LOW (ref 35.0–45.0)
Hemoglobin: 9.1 g/dL — ABNORMAL LOW (ref 11.7–15.5)
MCH: 23.9 pg — ABNORMAL LOW (ref 27.0–33.0)
MCHC: 31.2 g/dL — ABNORMAL LOW (ref 32.0–36.0)
MCV: 76.8 fL — AB (ref 80.0–100.0)
MPV: 9.6 fL (ref 7.5–12.5)
Platelets: 564 10*3/uL — ABNORMAL HIGH (ref 140–400)
RBC: 3.8 10*6/uL (ref 3.80–5.10)
RDW: 14.6 % (ref 11.0–15.0)
WBC: 14.1 10*3/uL — ABNORMAL HIGH (ref 3.8–10.8)

## 2018-03-16 LAB — BASIC METABOLIC PANEL WITH GFR
BUN / CREAT RATIO: 22 (calc) (ref 6–22)
BUN: 26 mg/dL — AB (ref 7–25)
CO2: 18 mmol/L — ABNORMAL LOW (ref 20–32)
Calcium: 9.5 mg/dL (ref 8.6–10.4)
Chloride: 105 mmol/L (ref 98–110)
Creat: 1.19 mg/dL — ABNORMAL HIGH (ref 0.60–0.93)
GFR, Est African American: 51 mL/min/{1.73_m2} — ABNORMAL LOW (ref >=60)
GFR, Est Non African American: 44 mL/min/{1.73_m2} — ABNORMAL LOW (ref >=60)
Glucose, Bld: 176 mg/dL — ABNORMAL HIGH (ref 65–139)
Potassium: 4.1 mmol/L (ref 3.5–5.3)
Sodium: 138 mmol/L (ref 135–146)

## 2018-03-16 MED ORDER — DOXYCYCLINE HYCLATE 100 MG PO TABS: 100.0000 mg | ORAL_TABLET | Freq: Two times a day (BID) | ORAL | 0 refills | Status: DC

## 2018-03-16 NOTE — Assessment & Plan Note (Addendum)
rept cbc , has elevated WBC, needs to submit CCUA with reflex c/s , doxycycline empirically to be  prescribed, has completed keflex and has upcoming ureteral stent removal

## 2018-03-16 NOTE — Assessment & Plan Note (Signed)
C/o increased back and suprapubic pain, additional oxy ir atr mid day prescribed, plan is for short term intervention only

## 2018-03-16 NOTE — Progress Notes (Signed)
doxycycline

## 2018-03-16 NOTE — Telephone Encounter (Signed)
-----   Message from [name], MD sent at 03/16/2018  4:40 AM EST ----- pls discuss with daughter [name], pt needs to increase water intake, she is mildly dehydrated, also her blood count ( hB ) is better, but white blood cell count is elevated suggesting possible infection, she did have a cough and has up coming procedure, I am recommending CCUA with reflex culture , also CXR  As she did have a cough, and am prescribing doxycyclinex 1 week, needs to submit urine before starting medication

## 2018-03-16 NOTE — Assessment & Plan Note (Signed)
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 She requires frequent changes in position to help alleviate this and tis cannot be achieved with a normal bed. A hospital bed is indicated

## 2018-03-16 NOTE — Assessment & Plan Note (Signed)
Zofran 4 mg Im in office, denies loose stool, has had no emesis

## 2018-03-16 NOTE — Assessment & Plan Note (Signed)
Controlled, no change in medication  

## 2018-03-17 ENCOUNTER — Other Ambulatory Visit: Payer: Self-pay

## 2018-03-17 ENCOUNTER — Emergency Department (HOSPITAL_COMMUNITY): Payer: Medicare HMO

## 2018-03-17 ENCOUNTER — Encounter (HOSPITAL_COMMUNITY): Payer: Self-pay | Admitting: Emergency Medicine

## 2018-03-17 ENCOUNTER — Observation Stay (HOSPITAL_COMMUNITY)
Admission: EM | Admit: 2018-03-17 | Discharge: 2018-03-20 | Disposition: A | Discharge status: Home / Self Care | Payer: Medicare HMO | Attending: Internal Medicine | Admitting: Internal Medicine

## 2018-03-17 DIAGNOSIS — Z86711 Personal history of pulmonary embolism: Secondary | ICD-10-CM | POA: Diagnosis present

## 2018-03-17 DIAGNOSIS — N179 Acute kidney failure, unspecified: Secondary | ICD-10-CM | POA: Insufficient documentation

## 2018-03-17 DIAGNOSIS — I10 Essential (primary) hypertension: Secondary | ICD-10-CM | POA: Diagnosis present

## 2018-03-17 DIAGNOSIS — E119 Type 2 diabetes mellitus without complications: Secondary | ICD-10-CM | POA: Insufficient documentation

## 2018-03-17 DIAGNOSIS — F419 Anxiety disorder, unspecified: Secondary | ICD-10-CM | POA: Diagnosis not present

## 2018-03-17 DIAGNOSIS — D509 Iron deficiency anemia, unspecified: Secondary | ICD-10-CM | POA: Insufficient documentation

## 2018-03-17 DIAGNOSIS — D638 Anemia in other chronic diseases classified elsewhere: Secondary | ICD-10-CM | POA: Diagnosis not present

## 2018-03-17 DIAGNOSIS — Z794 Long term (current) use of insulin: Secondary | ICD-10-CM | POA: Insufficient documentation

## 2018-03-17 DIAGNOSIS — I480 Paroxysmal atrial fibrillation: Secondary | ICD-10-CM | POA: Insufficient documentation

## 2018-03-17 DIAGNOSIS — R319 Hematuria, unspecified: Secondary | ICD-10-CM

## 2018-03-17 DIAGNOSIS — Z960 Presence of urogenital implants: Secondary | ICD-10-CM | POA: Diagnosis not present

## 2018-03-17 DIAGNOSIS — Z7901 Long term (current) use of anticoagulants: Secondary | ICD-10-CM | POA: Insufficient documentation

## 2018-03-17 DIAGNOSIS — N136 Pyonephrosis: Secondary | ICD-10-CM | POA: Diagnosis not present

## 2018-03-17 DIAGNOSIS — E1165 Type 2 diabetes mellitus with hyperglycemia: Secondary | ICD-10-CM

## 2018-03-17 DIAGNOSIS — R0989 Other specified symptoms and signs involving the circulatory and respiratory systems: Secondary | ICD-10-CM | POA: Diagnosis not present

## 2018-03-17 DIAGNOSIS — R1084 Generalized abdominal pain: Secondary | ICD-10-CM | POA: Diagnosis not present

## 2018-03-17 DIAGNOSIS — R300 Dysuria: Secondary | ICD-10-CM | POA: Diagnosis not present

## 2018-03-17 DIAGNOSIS — Z79899 Other long term (current) drug therapy: Secondary | ICD-10-CM | POA: Diagnosis not present

## 2018-03-17 DIAGNOSIS — K219 Gastro-esophageal reflux disease without esophagitis: Secondary | ICD-10-CM | POA: Diagnosis not present

## 2018-03-17 DIAGNOSIS — N390 Urinary tract infection, site not specified: Secondary | ICD-10-CM | POA: Diagnosis not present

## 2018-03-17 DIAGNOSIS — N132 Hydronephrosis with renal and ureteral calculous obstruction: Secondary | ICD-10-CM | POA: Diagnosis present

## 2018-03-17 DIAGNOSIS — Z466 Encounter for fitting and adjustment of urinary device: Secondary | ICD-10-CM | POA: Diagnosis not present

## 2018-03-17 DIAGNOSIS — E1149 Type 2 diabetes mellitus with other diabetic neurological complication: Secondary | ICD-10-CM

## 2018-03-17 DIAGNOSIS — F0390 Unspecified dementia without behavioral disturbance: Secondary | ICD-10-CM | POA: Diagnosis not present

## 2018-03-17 DIAGNOSIS — Z79891 Long term (current) use of opiate analgesic: Secondary | ICD-10-CM | POA: Diagnosis not present

## 2018-03-17 DIAGNOSIS — R0602 Shortness of breath: Secondary | ICD-10-CM | POA: Diagnosis present

## 2018-03-17 DIAGNOSIS — F329 Major depressive disorder, single episode, unspecified: Secondary | ICD-10-CM | POA: Diagnosis not present

## 2018-03-17 DIAGNOSIS — R109 Unspecified abdominal pain: Secondary | ICD-10-CM | POA: Diagnosis not present

## 2018-03-17 DIAGNOSIS — R531 Weakness: Secondary | ICD-10-CM | POA: Diagnosis not present

## 2018-03-17 DIAGNOSIS — G4700 Insomnia, unspecified: Secondary | ICD-10-CM | POA: Diagnosis not present

## 2018-03-17 DIAGNOSIS — I2699 Other pulmonary embolism without acute cor pulmonale: Secondary | ICD-10-CM | POA: Insufficient documentation

## 2018-03-17 DIAGNOSIS — R000 Tachycardia, unspecified: Secondary | ICD-10-CM | POA: Diagnosis not present

## 2018-03-17 DIAGNOSIS — R509 Fever, unspecified: Secondary | ICD-10-CM | POA: Diagnosis not present

## 2018-03-17 LAB — DIFFERENTIAL
Basophils Absolute: 0 10*3/uL (ref 0.0–0.1)
Basophils Relative: 0 %
EOS ABS: 0 10*3/uL (ref 0.0–0.5)
Eosinophils Relative: 0 %
Lymphocytes Relative: 12 %
Lymphs Abs: 1 10*3/uL (ref 0.7–4.0)
Monocytes Absolute: 0.8 10*3/uL (ref 0.1–1.0)
Monocytes Relative: 10 %
Neutro Abs: 6.1 10*3/uL (ref 1.7–7.7)
Neutrophils Relative %: 77 %

## 2018-03-17 LAB — URINALYSIS, ROUTINE W REFLEX MICROSCOPIC
Bilirubin Urine: NEGATIVE
Glucose, UA: NEGATIVE mg/dL
Ketones, ur: NEGATIVE mg/dL
Nitrite: NEGATIVE
Protein, ur: 100 mg/dL — AB
RBC / HPF: >50 RBC/hpf — ABNORMAL HIGH (ref 0–5)
Specific Gravity, Urine: 1.012 (ref 1.005–1.030)
pH: 6 (ref 5.0–8.0)

## 2018-03-17 LAB — HEPATIC FUNCTION PANEL
ALT: 12 U/L (ref 0–44)
AST: 16 U/L (ref 15–41)
Albumin: 3.4 g/dL — ABNORMAL LOW (ref 3.5–5.0)
Alkaline Phosphatase: 79 U/L (ref 38–126)
Bilirubin, Direct: <0.1 mg/dL (ref 0.0–0.2)
Total Bilirubin: 0.3 mg/dL (ref 0.3–1.2)
Total Protein: 7.2 g/dL (ref 6.5–8.1)

## 2018-03-17 LAB — CBC
HEMATOCRIT: 30.6 % — AB (ref 36.0–46.0)
Hemoglobin: 8.9 g/dL — ABNORMAL LOW (ref 12.0–15.0)
MCH: 24 pg — ABNORMAL LOW (ref 26.0–34.0)
MCHC: 29.1 g/dL — ABNORMAL LOW (ref 30.0–36.0)
MCV: 82.5 fL (ref 80.0–100.0)
Platelets: 404 10*3/uL — ABNORMAL HIGH (ref 150–400)
RBC: 3.71 MIL/uL — ABNORMAL LOW (ref 3.87–5.11)
RDW: 16 % — ABNORMAL HIGH (ref 11.5–15.5)
WBC: 9.7 10*3/uL (ref 4.0–10.5)
nRBC: 0 % (ref 0.0–0.2)

## 2018-03-17 LAB — BASIC METABOLIC PANEL
Anion gap: 12 (ref 5–15)
BUN: 21 mg/dL (ref 8–23)
CO2: 20 mmol/L — ABNORMAL LOW (ref 22–32)
Calcium: 8.8 mg/dL — ABNORMAL LOW (ref 8.9–10.3)
Chloride: 101 mmol/L (ref 98–111)
Creatinine, Ser: 1.29 mg/dL — ABNORMAL HIGH (ref 0.44–1.00)
GFR calc Af Amer: 46 mL/min — ABNORMAL LOW (ref >60)
GFR calc non Af Amer: 40 mL/min — ABNORMAL LOW (ref >60)
Glucose, Bld: 174 mg/dL — ABNORMAL HIGH (ref 70–99)
Potassium: 3.9 mmol/L (ref 3.5–5.1)
Sodium: 133 mmol/L — ABNORMAL LOW (ref 135–145)

## 2018-03-17 LAB — CBG MONITORING, ED: GLUCOSE-CAPILLARY: 122 mg/dL — AB (ref 70–99)

## 2018-03-17 LAB — LACTIC ACID, PLASMA
Lactic Acid, Venous: 1.1 mmol/L (ref 0.5–1.9)
Lactic Acid, Venous: 1 mmol/L (ref 0.5–1.9)

## 2018-03-17 LAB — GLUCOSE, CAPILLARY: Glucose-Capillary: 201 mg/dL — ABNORMAL HIGH (ref 70–99)

## 2018-03-17 MED ORDER — MORPHINE SULFATE (PF) 4 MG/ML IV SOLN
4.0000 mg | Freq: Once | INTRAVENOUS | Status: AC
  Administered 2018-03-17: 4 mg via INTRAVENOUS
  Filled 2018-03-17: qty 1

## 2018-03-17 MED ORDER — TOPIRAMATE 100 MG PO TABS
100.0000 mg | ORAL_TABLET | Freq: Two times a day (BID) | ORAL | Status: DC
  Administered 2018-03-17 – 2018-03-20 (×6): 100 mg via ORAL
  Filled 2018-03-17 (×6): qty 1

## 2018-03-17 MED ORDER — IOHEXOL 300 MG/ML  SOLN
100.0000 mL | Freq: Once | INTRAMUSCULAR | Status: AC | PRN
  Administered 2018-03-17: 80 mL via INTRAVENOUS

## 2018-03-17 MED ORDER — SODIUM CHLORIDE 0.9 % IV SOLN
1.0000 g | INTRAVENOUS | Status: DC
  Filled 2018-03-17: qty 1

## 2018-03-17 MED ORDER — OXYCODONE HCL 5 MG PO TABS
5.0000 mg | ORAL_TABLET | Freq: Once | ORAL | Status: AC
  Administered 2018-03-17: 5 mg via ORAL
  Filled 2018-03-17: qty 1

## 2018-03-17 MED ORDER — MONTELUKAST SODIUM 10 MG PO TABS
10.0000 mg | ORAL_TABLET | Freq: Every day | ORAL | Status: DC
  Administered 2018-03-17 – 2018-03-19 (×3): 10 mg via ORAL
  Filled 2018-03-17 (×3): qty 1

## 2018-03-17 MED ORDER — IPRATROPIUM-ALBUTEROL 0.5-2.5 (3) MG/3ML IN SOLN
RESPIRATORY_TRACT | Status: AC
  Administered 2018-03-17: 3 mL
  Filled 2018-03-17: qty 3

## 2018-03-17 MED ORDER — SODIUM CHLORIDE 0.9 % IV BOLUS
1000.0000 mL | Freq: Once | INTRAVENOUS | Status: AC
  Administered 2018-03-17: 1000 mL via INTRAVENOUS

## 2018-03-17 MED ORDER — SODIUM CHLORIDE 0.9 % IV BOLUS
500.0000 mL | Freq: Once | INTRAVENOUS | Status: AC
  Administered 2018-03-17: 500 mL via INTRAVENOUS

## 2018-03-17 MED ORDER — HYDROCOD POLST-CPM POLST ER 10-8 MG/5ML PO SUER
5.0000 mL | Freq: Two times a day (BID) | ORAL | Status: DC | PRN
  Administered 2018-03-17 – 2018-03-20 (×3): 5 mL via ORAL
  Filled 2018-03-17 (×3): qty 5

## 2018-03-17 MED ORDER — MORPHINE SULFATE (PF) 2 MG/ML IV SOLN
1.0000 mg | INTRAVENOUS | Status: DC | PRN
  Administered 2018-03-17: 2 mg via INTRAVENOUS
  Administered 2018-03-18: 1 mg via INTRAVENOUS
  Administered 2018-03-18 – 2018-03-19 (×3): 2 mg via INTRAVENOUS
  Administered 2018-03-19: 1 mg via INTRAVENOUS
  Filled 2018-03-17 (×7): qty 1

## 2018-03-17 MED ORDER — IOPAMIDOL (ISOVUE-300) INJECTION 61%
INTRAVENOUS | Status: AC
  Filled 2018-03-17: qty 100

## 2018-03-17 MED ORDER — TIMOLOL MALEATE 0.5 % OP SOLN
1.0000 [drp] | Freq: Two times a day (BID) | OPHTHALMIC | Status: DC
  Administered 2018-03-18 – 2018-03-20 (×6): 1 [drp] via OPHTHALMIC
  Filled 2018-03-17: qty 5

## 2018-03-17 MED ORDER — SODIUM CHLORIDE 0.9 % IV SOLN
1.0000 g | Freq: Once | INTRAVENOUS | Status: AC
  Administered 2018-03-17: 1 g via INTRAVENOUS
  Filled 2018-03-17: qty 1

## 2018-03-17 MED ORDER — COLCHICINE 0.6 MG PO TABS
0.6000 mg | ORAL_TABLET | Freq: Every day | ORAL | Status: DC
  Administered 2018-03-18 – 2018-03-20 (×3): 0.6 mg via ORAL
  Filled 2018-03-17 (×3): qty 1

## 2018-03-17 MED ORDER — BRIMONIDINE TARTRATE 0.2 % OP SOLN
1.0000 [drp] | Freq: Two times a day (BID) | OPHTHALMIC | Status: DC
  Administered 2018-03-18 – 2018-03-20 (×6): 1 [drp] via OPHTHALMIC
  Filled 2018-03-17: qty 5

## 2018-03-17 MED ORDER — NYSTATIN 100000 UNIT/GM EX POWD: Freq: Every day | CUTANEOUS | Status: DC | PRN

## 2018-03-17 MED ORDER — DONEPEZIL HCL 5 MG PO TABS
10.0000 mg | ORAL_TABLET | Freq: Every day | ORAL | Status: DC
  Administered 2018-03-17 – 2018-03-19 (×3): 10 mg via ORAL
  Filled 2018-03-17 (×3): qty 2

## 2018-03-17 MED ORDER — TEMAZEPAM 15 MG PO CAPS: 30.0000 mg | ORAL_CAPSULE | Freq: Every evening | ORAL | Status: DC | PRN

## 2018-03-17 MED ORDER — IPRATROPIUM-ALBUTEROL 0.5-2.5 (3) MG/3ML IN SOLN
3.0000 mL | Freq: Four times a day (QID) | RESPIRATORY_TRACT | Status: DC
  Administered 2018-03-17 – 2018-03-18 (×2): 3 mL via RESPIRATORY_TRACT
  Filled 2018-03-17 (×2): qty 3

## 2018-03-17 MED ORDER — AMLODIPINE BESYLATE 5 MG PO TABS
10.0000 mg | ORAL_TABLET | Freq: Every day | ORAL | Status: DC
  Administered 2018-03-18 – 2018-03-20 (×3): 10 mg via ORAL
  Filled 2018-03-17 (×3): qty 2

## 2018-03-17 MED ORDER — OXYCODONE-ACETAMINOPHEN 5-325 MG PO TABS
1.0000 | ORAL_TABLET | Freq: Two times a day (BID) | ORAL | Status: DC
  Administered 2018-03-18 – 2018-03-20 (×6): 1 via ORAL
  Filled 2018-03-17 (×6): qty 1

## 2018-03-17 MED ORDER — OXYCODONE-ACETAMINOPHEN 10-325 MG PO TABS: 1.0000 | ORAL_TABLET | Freq: Two times a day (BID) | ORAL | Status: DC

## 2018-03-17 MED ORDER — ENOXAPARIN SODIUM 40 MG/0.4ML ~~LOC~~ SOLN: 40.0000 mg | SUBCUTANEOUS | Status: DC

## 2018-03-17 MED ORDER — INSULIN GLARGINE 100 UNIT/ML ~~LOC~~ SOLN
40.0000 [IU] | Freq: Two times a day (BID) | SUBCUTANEOUS | Status: DC
  Administered 2018-03-18 – 2018-03-20 (×6): 40 [IU] via SUBCUTANEOUS
  Filled 2018-03-17 (×7): qty 0.4

## 2018-03-17 MED ORDER — SUMATRIPTAN 20 MG/ACT NA SOLN
1.0000 | NASAL | Status: DC | PRN
  Filled 2018-03-17: qty 1

## 2018-03-17 MED ORDER — PANTOPRAZOLE SODIUM 40 MG PO TBEC
40.0000 mg | DELAYED_RELEASE_TABLET | Freq: Every day | ORAL | Status: DC
  Administered 2018-03-17 – 2018-03-20 (×4): 40 mg via ORAL
  Filled 2018-03-17 (×4): qty 1

## 2018-03-17 MED ORDER — ACETAMINOPHEN 500 MG PO TABS
1000.0000 mg | ORAL_TABLET | Freq: Four times a day (QID) | ORAL | Status: DC | PRN
  Administered 2018-03-19 – 2018-03-20 (×2): 1000 mg via ORAL
  Filled 2018-03-17 (×2): qty 2

## 2018-03-17 MED ORDER — FLUOXETINE HCL 10 MG PO CAPS
10.0000 mg | ORAL_CAPSULE | Freq: Every day | ORAL | Status: DC
  Administered 2018-03-18 – 2018-03-20 (×3): 10 mg via ORAL
  Filled 2018-03-17 (×3): qty 1

## 2018-03-17 MED ORDER — BUSPIRONE HCL 5 MG PO TABS
7.5000 mg | ORAL_TABLET | Freq: Three times a day (TID) | ORAL | Status: DC
  Administered 2018-03-17 – 2018-03-20 (×9): 7.5 mg via ORAL
  Filled 2018-03-17 (×9): qty 2

## 2018-03-17 MED ORDER — BRIMONIDINE TARTRATE-TIMOLOL 0.2-0.5 % OP SOLN: 1.0000 [drp] | Freq: Two times a day (BID) | OPHTHALMIC | Status: DC

## 2018-03-17 MED ORDER — FLUOXETINE HCL (PMDD) 10 MG PO TABS: 10.0000 mg | ORAL_TABLET | Freq: Every day | ORAL | Status: DC

## 2018-03-17 MED ORDER — OXYCODONE HCL 5 MG PO TABS
5.0000 mg | ORAL_TABLET | Freq: Two times a day (BID) | ORAL | Status: DC
  Administered 2018-03-18 – 2018-03-20 (×6): 5 mg via ORAL
  Filled 2018-03-17 (×6): qty 1

## 2018-03-17 MED ORDER — CLONIDINE HCL 0.2 MG PO TABS
0.2000 mg | ORAL_TABLET | Freq: Three times a day (TID) | ORAL | Status: DC
  Administered 2018-03-17 – 2018-03-20 (×9): 0.2 mg via ORAL
  Filled 2018-03-17 (×9): qty 1

## 2018-03-17 MED ORDER — OLOPATADINE HCL 0.1 % OP SOLN
1.0000 [drp] | Freq: Two times a day (BID) | OPHTHALMIC | Status: DC
  Administered 2018-03-18 – 2018-03-20 (×6): 1 [drp] via OPHTHALMIC
  Filled 2018-03-17: qty 5

## 2018-03-17 MED ORDER — HYDROXYZINE HCL 25 MG PO TABS: 25.0000 mg | ORAL_TABLET | Freq: Three times a day (TID) | ORAL | Status: DC | PRN

## 2018-03-17 MED ORDER — ATORVASTATIN CALCIUM 40 MG PO TABS
40.0000 mg | ORAL_TABLET | Freq: Every day | ORAL | Status: DC
  Administered 2018-03-18 – 2018-03-20 (×3): 40 mg via ORAL
  Filled 2018-03-17 (×3): qty 1

## 2018-03-17 MED ORDER — MECLIZINE HCL 25 MG PO TABS: 25.0000 mg | ORAL_TABLET | Freq: Three times a day (TID) | ORAL | Status: DC | PRN

## 2018-03-17 MED ORDER — IMIPRAMINE HCL 50 MG PO TABS
50.0000 mg | ORAL_TABLET | Freq: Every day | ORAL | Status: DC
  Administered 2018-03-18 – 2018-03-19 (×3): 50 mg via ORAL
  Filled 2018-03-17 (×3): qty 1

## 2018-03-17 MED ORDER — DABIGATRAN ETEXILATE MESYLATE 150 MG PO CAPS
150.0000 mg | ORAL_CAPSULE | Freq: Two times a day (BID) | ORAL | Status: DC
  Administered 2018-03-18 – 2018-03-20 (×6): 150 mg via ORAL
  Filled 2018-03-17 (×6): qty 1

## 2018-03-17 MED ORDER — SODIUM CHLORIDE (PF) 0.9 % IJ SOLN
INTRAMUSCULAR | Status: AC
  Filled 2018-03-17: qty 50

## 2018-03-17 MED ORDER — SENNOSIDES-DOCUSATE SODIUM 8.6-50 MG PO TABS
2.0000 | ORAL_TABLET | Freq: Every day | ORAL | Status: DC
  Administered 2018-03-17 – 2018-03-19 (×3): 2 via ORAL
  Filled 2018-03-17 (×3): qty 2

## 2018-03-17 NOTE — ED Notes (Signed)
Pt eating

## 2018-03-17 NOTE — ED Notes (Signed)
300 cc of urine collected from bag within the last hour. Bag was emptied.

## 2018-03-17 NOTE — ED Provider Notes (Signed)
[facility] DEPT Provider Note   CSN: [id number] Arrival date & time: 03/17/18  1144     History   Chief Complaint Chief Complaint  Patient presents with  . Flank Pain    HPI [name] is a 78 y.o. female presenting for evaluation of flank pain.  Level 5 caveat, patient with dementia.  History really provided by the family.  Per family, patient has had issues with kidney stones, UTIs, and cough recently.  Patient was seen by her PCP 2 days ago, was found to have an elevated white count and a cough.  As such, there is concern for possible pneumonia.  Patient was supposed to return today for chest x-ray, but they brought her here instead.  Additionally, they report patient is having continued urinary bleeding.  She was intermittently reports flank pain, and reports dysuria to the family.  Family reports fevers at home of up to 101, have been improving with Tylenol.  Patient has had a cough for the past several days, was thought to be bronchitis by PCP, but pending chest x-ray.  Patient had stent placement 3 weeks ago with Dr. Junious Silk from urology for kidney stones.  Stent removal is planned for Tuesday, 5 days from now.  Patient was prescribed doxycycline by her PCP, had her first dose last night.  Additionally, patient was prescribed nitrofurantoin by Dr. Junious Silk, which she started 12 days ago in preparation for stent removal.   H/o chronic anemia. afib on pradaxa, chronic pain, DM, GERD, urinary incontinence.   HPI  Past Medical History:  Diagnosis Date  . Anemia, iron deficiency 2012   Evaluated by Dr. Oneida Alar; H&H of 9.3/30.8 with [place] in 10/2010; 3/3 positive Hemoccult cards in 07/2011  . Anxiety    was on Prozac for a couple of weeks  . Atrial fibrillation ([place])    takes Coumadin daily  . Bruises easily    takes Coumadin  . Chronic anticoagulation 07/21/2010  . Chronic back pain    related to knee pain  . Degenerative joint disease    Knees  . Dementia ([place])   . Depression   . Diabetes mellitus    takes Metformin daily  . Gastroesophageal reflux disease    takes Omeprazole daily  . Glaucoma   . Glaucoma    uses eye drops at night  . History of blood transfusion 1969  . History of gout    was on medication but taken off of several months ago  . Hx of colonic polyps   . Hx of migraines    last one about a yr ago;takes Topamax daily  . Hyperlipidemia    takes Pravastatin daily  . Hypertension    takes Hyzaar daily and Propranolol   . Insomnia    takes Restoril prn  . Joint pain   . Joint swelling   . Memory loss    takes donepezil  . Migraine   . Migraine   . Nocturia   . Overweight(278.02)   . Pneumonia 1969   hx of  . Pulmonary embolism [facility]) May 2012   acute presentation, bilateral PE  . Skin spots-aging   . Urinary frequency     Patient Active Problem List   Diagnosis Date Noted  . 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 E. coli UTI/complicated UTI with nephrolithiasis 02/26/2018  . Hydronephrosis with renal and ureteral calculus obstruction 02/26/2018  . Hematuria 02/25/2018  . History of pulmonary embolism 01/25/2018  . Hospital discharge follow-up  08/25/2016  . 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 Encounter for chronic pain management 07/17/2016  . Chronic pain syndrome 02/03/2016  . Nausea 09/03/2014  . Bilateral knee pain 04/23/2014  . Diabetes mellitus, insulin dependent (IDDM), uncontrolled ([place]) 11/15/2013  . Right-sided low back pain with right-sided sciatica 07/02/2013  . Seasonal allergies 04/11/2013  . Urinary incontinence 04/11/2013  . Metabolic syndrome X 83/38/2505  . Chronic anticoagulation 12/22/2012  . Vitamin D deficiency 11/05/2012  . Memory loss 11/01/2012  . Depression with anxiety 11/01/2012  . Anemia, iron deficiency   . Pruritus 09/03/2009  . Morbid obesity ([place]) 08/16/2008  . Dyslipidemia 05/30/2007  . Migraine 05/30/2007  . Essential hypertension 05/30/2007  .  Gastroesophageal reflux disease 05/30/2007    Past Surgical History:  Procedure Laterality Date  . CATARACT EXTRACTION W/PHACO Right 04/11/2012   Procedure: CATARACT EXTRACTION PHACO AND INTRAOCULAR LENS PLACEMENT (IOC);  Surgeon: Elta Guadeloupe T. Gershon Crane, MD;  Location: [facility] ORS;  Service: Ophthalmology;  Laterality: Right;  CDE=9.61  . CATARACT EXTRACTION W/PHACO Left 12/26/2012   Procedure: CATARACT EXTRACTION PHACO AND INTRAOCULAR LENS PLACEMENT (IOC);  Surgeon: Elta Guadeloupe T. Gershon Crane, MD;  Location: [facility] ORS;  Service: Ophthalmology;  Laterality: Left;  CDE:7.74  . COLONOSCOPY  Jan 2002; 2012   2002: Dr. Tamala Julian, ext. hemorrhoids, nl colon; 2012-adenomatous polyps, gastritis on EGD  . DILATION AND CURETTAGE OF UTERUS    . ESOPHAGOGASTRODUODENOSCOPY  08/24/2011   ESOPHAGOGASTRODUODENOSCOPY; esophageal dilatation; Rogene Houston, MD;  . Freda Munro CAPSULE STUDY  08/18/2011   Procedure: GIVENS CAPSULE STUDY;  Surgeon: Rogene Houston, MD;  Location: [facility] ENDO SUITE;  Service: Endoscopy;  Laterality: N/A;  730  . KNEE ARTHROSCOPY W/ MENISCECTOMY  90's   Left  . ORIF ANKLE FRACTURE Right 90's  . TONSILLECTOMY    . TOTAL KNEE ARTHROPLASTY  10/20/2011   Procedure: TOTAL KNEE ARTHROPLASTY;  Surgeon: Ninetta Lights, MD;  Location: [facility];  Service: Orthopedics;  Laterality: Left;  . UPPER GASTROINTESTINAL ENDOSCOPY    . URETHRAL DILATION       OB History   No obstetric history on file.      Home Medications    Prior to Admission medications   Medication Sig Start Date End Date Taking? Authorizing Provider  acetaminophen (TYLENOL) 500 MG tablet Take 1,000 mg by mouth every 6 (six) hours as needed for mild pain or fever.    Yes [provider]  acyclovir (ZOVIRAX) 400 MG tablet TAKE 1 TABLET (400 MG TOTAL) BY MOUTH 3 (THREE) TIMES DAILY. 09/12/17  Yes [name], MD  albuterol (VENTOLIN HFA) 108 (90 Base) MCG/ACT inhaler Inhale 2 puffs into the lungs every 6 (six) hours as needed for wheezing or  shortness of breath. 02/27/18  Yes Emokpae, Courage, MD  amLODipine (NORVASC) 10 MG tablet Take 1 tablet (10 mg total) by mouth daily. 02/27/18  Yes Emokpae, Courage, MD  atorvastatin (LIPITOR) 40 MG tablet TAKE 1 TABLET BY MOUTH EVERY DAY Patient taking differently: Take 40 mg by mouth daily.  09/26/17  Yes [name], MD  Blood Glucose Monitoring Suppl (ACCU-CHEK AVIVA PLUS) w/Device KIT For once daily testing dx E11.9 11/24/15  Yes [name], MD  busPIRone (BUSPAR) 7.5 MG tablet TAKE 1 TABLET BY MOUTH 3 TIMES DAILY Patient taking differently: Take 7.5 mg by mouth 3 (three) times daily.  12/23/17  Yes [name], MD  chlorpheniramine-HYDROcodone [facility] PENNKINETIC ER) 10-8 MG/5ML SUER Take one teaspoon at bedtime as needed for excessive cough 03/15/18  Yes [name], MD  cloNIDine (CATAPRES) 0.2 MG tablet TAKE 1 TABLET (0.2 MG TOTAL) BY MOUTH 3 (THREE) TIMES DAILY. Patient taking differently: Take 0.6 mg by mouth at bedtime.  10/04/17  Yes [name], MD  colchicine 0.6 MG tablet Take 1 tablet (0.6 mg total) by mouth daily. May decrease to once per day as needed for diarrhea 06/27/17  Yes Tanna Furry, MD  COMBIGAN 0.2-0.5 % ophthalmic solution Place 1 drop into both eyes 2 (two) times daily. 03/23/17  Yes [provider]  conjugated estrogens (PREMARIN) vaginal cream Place vaginally 2 (two) times a week. Apply sparingly ( fingertip)  Twice weekly to vaginal area on Wednesdays and Fridays 01/30/18  Yes Johnson, Clanford L, MD  dabigatran (PRADAXA) 150 MG CAPS capsule Take 1 capsule (150 mg total) by mouth 2 (two) times daily. 02/27/18  Yes Emokpae, Courage, MD  donepezil (ARICEPT) 10 MG tablet TAKE 1 TABLET BY MOUTH EVERYDAY AT BEDTIME Patient taking differently: Take 10 mg by mouth at bedtime.  02/13/18  Yes [name], MD  doxycycline (VIBRA-TABS) 100 MG tablet Take 1 tablet (100 mg total) by mouth 2 (two) times daily. 03/16/18  Yes [name], MD  Fluoxetine HCl, PMDD, 10 MG TABS Take 1 tablet (10 mg total) by mouth daily. 01/27/18  Yes Johnson, Clanford L, MD  hydrOXYzine (ATARAX/VISTARIL) 25 MG tablet Take 1 tablet (25 mg total) by mouth 3 (three) times daily as needed for itching. 01/27/18  Yes Johnson, Clanford L, MD  imipramine (TOFRANIL) 50 MG tablet Take 1 tablet (50 mg total) by mouth at bedtime. 02/27/18  Yes Emokpae, Courage, MD  Insulin Glargine (LANTUS SOLOSTAR) 100 UNIT/ML Solostar Pen Inject 40 Units into the skin 2 (two) times daily. 01/27/18  Yes Johnson, Clanford L, MD  meclizine (ANTIVERT) 25 MG tablet TAKE 1 TABLET THREE TIMES DAILY AS NEEDED FOR DIZZINESS. Patient taking differently: Take 25 mg by mouth 3 (three) times daily as needed for nausea.  02/01/18  Yes [name], MD  montelukast (SINGULAIR) 10 MG tablet TAKE 1 TABLET BY MOUTH AT BEDTIME Patient taking differently: Take 10 mg by mouth at bedtime.  08/23/17  Yes [name], MD  nitrofurantoin (MACRODANTIN) 100 MG capsule Take 100 mg by mouth at bedtime.   Yes [provider]  nystatin (MYCOSTATIN/NYSTOP) powder Apply topically daily as needed (for rash under breasts). 01/27/18  Yes Johnson, Clanford L, MD  olopatadine (PATANOL) 0.1 % ophthalmic solution Place 1 drop into both eyes 2 (two) times daily. 01/27/18  Yes Johnson, Clanford L, MD  omeprazole (PRILOSEC) 40 MG capsule Take 1 capsule (40 mg total) by mouth 2 (two) times daily. 01/27/18  Yes Johnson, Clanford L, MD  oxyCODONE (OXY IR/ROXICODONE) 5 MG immediate release tablet Take one tablet in at midday for uncontrolled pain 03/15/18 04/15/18 Yes [name], MD  oxyCODONE-acetaminophen (PERCOCET) 10-325 MG tablet Take 1 tablet by mouth 2 (two) times daily.   Yes [provider]  senna-docusate (SENOKOT-S) 8.6-50 MG tablet Take 2 tablets by mouth at bedtime. Patient taking differently: Take 2 tablets by mouth 2 (two) times daily.  02/27/18 02/27/19 Yes  Emokpae, Courage, MD  SUMAtriptan (IMITREX) 5 MG/ACT nasal spray Place 1 spray into the nose every 2 (two) hours as needed for migraine.  01/16/17  Yes [provider]  temazepam (RESTORIL) 30 MG capsule Take 30 mg by mouth at bedtime as needed for sleep.   Yes [provider]  topiramate (TOPAMAX) 100 MG tablet TAKE 1 TABLET BY MOUTH TWICE A DAY Patient taking differently: Take 100 mg by mouth 2 (two) times daily.  09/12/17  Yes [name], MD  Vitamin D, Ergocalciferol, (DRISDOL) 50000 units CAPS capsule TAKE 1 CAPSULE (50,000 UNITS TOTAL) BY MOUTH ONCE A WEEK. Patient taking differently: Take 50,000 Units by mouth every 7 (seven) days. Friday 11/18/17  Yes [name], MD  glucose blood (ONE TOUCH ULTRA TEST) test strip USE TO TEST TWICE DAILY 12/26/17   [name], MD    Family History Family History  Problem Relation Age of Onset  . Diabetes Mother   . Hypertension Mother   . Arthritis Mother   . Heart failure Mother   . Migraines Mother   . Kidney failure Mother   . Leukemia Father   . Migraines Father   . Kidney failure Father   . Diabetes Sister   . Hypertension Sister   . Diabetes Brother   . Hypertension Brother   . Hypertension Brother   . Hypertension Brother   . Hypertension Brother   . Diabetes Brother   . Diabetes Brother   . Diabetes Brother   . Pulmonary embolism Sister   . Migraines Sister   . Kidney failure Brother   . Colon cancer Neg Hx   . Colon polyps Neg Hx     Social History Social History   Tobacco Use  . Smoking status: Never Smoker  . Smokeless tobacco: Never Used  Substance Use Topics  . Alcohol use: No  . Drug use: No     Allergies   Penicillins; Fentanyl; Sulfonamide derivatives; and Codeine   Review of Systems Review of Systems  Unable to perform ROS: Dementia  Constitutional: Positive for fever.  Respiratory: Positive for cough.   Genitourinary: Positive for dysuria, flank pain and  hematuria.  Hematological: Bruises/bleeds easily.     Physical Exam Updated Vital Signs BP (!) 147/87   Pulse (!) 104   Temp 100.1 F (37.8 C) (Rectal)   Resp (!) 28   SpO2 99%   Physical Exam Vitals signs and nursing note reviewed.  Constitutional:      General: She is not in acute distress.    Appearance: She is well-developed.     Comments: Early female who appears chronically ill and warm to touch, in NAD  HENT:     Head: Normocephalic and atraumatic.     Mouth/Throat:     Mouth: Mucous membranes are dry.  Eyes:     Conjunctiva/sclera: Conjunctivae normal.     Pupils: Pupils are equal, round, and reactive to light.  Neck:     Musculoskeletal: Normal range of motion and neck supple.  Cardiovascular:     Rate and Rhythm: Regular rhythm. Tachycardia present.     Pulses: Normal pulses.     Comments: Mildly tachycardic around 110 Pulmonary:     Effort: Pulmonary effort is normal. No respiratory distress.     Breath sounds: Normal breath sounds. No wheezing.     Comments: Clear lung sounds in all fields. Cough noted on exam. Pt grimaces after coughing Abdominal:     General: There is no distension.     Palpations: Abdomen is soft. There is no mass.     Tenderness: There is no abdominal tenderness. There is right CVA tenderness and left CVA tenderness. There is no guarding or rebound.     Comments: Bilateral CVA tenderness. No ttp of anterior abd. Soft  without rigidity, guarding, or distention  Musculoskeletal: Normal range of motion.  Skin:    General: Skin is warm and dry.     Capillary Refill: Capillary refill takes less than 2 seconds.  Neurological:     Mental Status: She is alert.     Comments: mentation at baseline per family      ED Treatments / Results  Labs (all labs ordered are listed, but only abnormal results are displayed) Labs Reviewed  BASIC METABOLIC PANEL - Abnormal; Notable for the following components:      Result Value   Sodium 133 (*)     CO2 20 (*)    Glucose, Bld 174 (*)    Creatinine, Ser 1.29 (*)    Calcium 8.8 (*)    GFR calc non Af Amer 40 (*)    GFR calc Af Amer 46 (*)    All other components within normal limits  CBC - Abnormal; Notable for the following components:   RBC 3.71 (*)    Hemoglobin 8.9 (*)    HCT 30.6 (*)    MCH 24.0 (*)    MCHC 29.1 (*)    RDW 16.0 (*)    Platelets 404 (*)    All other components within normal limits  HEPATIC FUNCTION PANEL - Abnormal; Notable for the following components:   Albumin 3.4 (*)    All other components within normal limits  URINE CULTURE  CULTURE, BLOOD (ROUTINE X 2)  CULTURE, BLOOD (ROUTINE X 2)  LACTIC ACID, PLASMA  DIFFERENTIAL  URINALYSIS, ROUTINE W REFLEX MICROSCOPIC  LACTIC ACID, PLASMA    EKG None  Radiology No results found.  Procedures Procedures (including critical care time)  Medications Ordered in ED Medications  sodium chloride 0.9 % bolus 500 mL (500 mLs Intravenous Bolus 03/17/18 1507)  ceFEPIme (MAXIPIME) 1 g in sodium chloride 0.9 % 100 mL IVPB (1 g Intravenous New Bag/Given 03/17/18 1507)  oxyCODONE (Oxy IR/ROXICODONE) immediate release tablet 5 mg (has no administration in time range)  sodium chloride 0.9 % bolus 500 mL (500 mLs Intravenous Bolus 03/17/18 1402)     Initial Impression / Assessment and Plan / ED Course  I have reviewed the triage vital signs and the nursing notes.  Pertinent labs & imaging results that were available during my care of the patient were reviewed by me and considered in my medical decision making (see chart for details).     Pt presenting for evaluation of fever, cough, bilateral flank pain.  History concerning for recent stent placement and signs of dysuria with continued hematuria.  Family is concerned patient has another UTI, concerned about a possible pneumonia.  On arrival, patient tachycardic to 120.  Patient feels very warm to the touch, rectal temperature 100.1, this is after tylenol.  Will  obtain labs, urine, chest x-ray, lactic, blood cultures, and reassess.  Liter of fluid and antibiotics started.  Labs show improving leukocytosis from PCP office, 9.1 when compared to 15.  Hemoglobin stable at 8.9.  Creatinine slowly trending up, but no AKI at this time.  Urine and chest x-ray pending.  Chest x-ray viewed interpreted by me, no pneumonia, pneumothorax, effusion.  Urine with large leuks, greater than 50 white cells.  Concern for UTI.  Patient has been on 2 different antibiotics, nitrofurantoin and started doxycycline yesterday.  As such, concern for failure of outpatient treatment.  Will consult with urology for further evaluation. Case discussed with attending, Dr. Billy Fischer evaluated the pt.  Discussed with Dr.  Dahlstedt from urology, recommend switching patient's antibiotics. If admission is warranted, he recommends admission to hospitlalist and he will consult. Will call for [facility] admission.   Discussed with Dr. Karleen Hampshire from [facility], pt to be admitted.    Final Clinical Impressions(s) / ED Diagnoses   Final diagnoses:  Urinary tract infection with hematuria, site unspecified    ED Discharge Orders    None       Franchot Heidelberg, PA-C 03/17/18 1618    Gareth Morgan, MD 03/17/18 2135

## 2018-03-17 NOTE — ED Notes (Signed)
Patient transported to CT 

## 2018-03-17 NOTE — Progress Notes (Signed)
Pharmacy Antibiotic Note  [name] is a 78 y.o. female admitted on 03/17/2018 with UTI.  Pharmacy has been consulted for cefepime dosing. Patient with hx multiple UTIs including Ecoli x 2 and proteus with most recent Ecoli UTI on 02/25/18 that was only resistant to ampicillin  Plan: Cefepime 1g IV q24 per current renal function. Will most likely be able to narrow abx quickly     Temp (24hrs), Avg:98.9 F (37.2 C), Min:98.2 F (36.8 C), Max:100.1 F (37.8 C)  Recent Labs  Lab 03/15/18 1506 03/17/18 1222 03/17/18 1327  WBC 14.1* 9.7  --   CREATININE 1.19* 1.29*  --   LATICACIDVEN  --   --  1.1    Estimated Creatinine Clearance: 44.3 mL/min (A) (by C-G formula based on SCr of 1.29 mg/dL (H)).    Allergies: see list including PCNS  Thank you for allowing pharmacy to be a part of this patient's care.  Kara Mead 03/17/2018 6:42 PM

## 2018-03-17 NOTE — ED Notes (Signed)
Rectal temp 100.1

## 2018-03-17 NOTE — ED Notes (Signed)
ED TO INPATIENT HANDOFF REPORT  Name/Age/Gender [name] 78 y.o. female  Code Status    Code Status Orders  (From admission, onward)         Start     Ordered   03/17/18 1827  Do not attempt resuscitation (DNR)  Continuous    Question Answer Comment  In the event of cardiac or respiratory ARREST Do not call a "code blue"   In the event of cardiac or respiratory ARREST Do not perform Intubation, CPR, defibrillation or ACLS   In the event of cardiac or respiratory ARREST Use medication by any route, position, wound care, and other measures to relive pain and suffering. May use oxygen, suction and manual treatment of airway obstruction as needed for comfort.      03/17/18 1827        Code Status History    Date Active Date Inactive Code Status Order ID Comments User Context   02/25/2018 0523 02/27/2018 1659 DNR [id number]  Oswald Hillock, MD Inpatient   01/25/2018 0008 01/27/2018 1729 DNR [id number]  Oswald Hillock, MD ED   06/19/2017 [phone] 06/22/2017 1838 DNR [id number]  Oswald Hillock, MD Inpatient   08/14/2016 0105 08/15/2016 1631 Full Code [id number]  Oswald Hillock, MD Inpatient    Advance Directive Documentation     Most Recent Value  Type of Advance Directive  Healthcare Power of Attorney  Pre-existing out of facility DNR order (yellow form or pink MOST form)  -  "MOST" Form in Place?  -      Home/SNF/Other Home  Chief Complaint pain,fever  Level of Care/Admitting Diagnosis ED Disposition    ED Disposition Condition [place]: [facility] [100102]  Level of Care: Telemetry [5]  Admit to tele based on following criteria: Complex arrhythmia (Bradycardia/Tachycardia)  Diagnosis: UTI (urinary tract infection) [889169]  Admitting Physician: Hosie Poisson [[id number]]  Attending Physician: Hosie Poisson [[id number]]  PT Class (Do Not Modify): Observation [104]  PT Acc Code (Do Not Modify): Observation [10022]       Medical History Past  Medical History:  Diagnosis Date  . Anemia, iron deficiency 2012   Evaluated by Dr. Oneida Alar; H&H of 9.3/30.8 with [place] in 10/2010; 3/3 positive Hemoccult cards in 07/2011  . Anxiety    was on Prozac for a couple of weeks  . Atrial fibrillation ([place])    takes Coumadin daily  . Bruises easily    takes Coumadin  . Chronic anticoagulation 07/21/2010  . Chronic back pain    related to knee pain  . Degenerative joint disease    Knees  . Dementia ([place])   . Depression   . Diabetes mellitus    takes Metformin daily  . Gastroesophageal reflux disease    takes Omeprazole daily  . Glaucoma   . Glaucoma    uses eye drops at night  . History of blood transfusion 1969  . History of gout    was on medication but taken off of several months ago  . Hx of colonic polyps   . Hx of migraines    last one about a yr ago;takes Topamax daily  . Hyperlipidemia    takes Pravastatin daily  . Hypertension    takes Hyzaar daily and Propranolol   . Insomnia    takes Restoril prn  . Joint pain   . Joint swelling   . Memory loss    takes donepezil  . Migraine   .  Migraine   . Nocturia   . Overweight(278.02)   . Pneumonia 1969   hx of  . Pulmonary embolism [facility]) May 2012   acute presentation, bilateral PE  . Skin spots-aging   . Urinary frequency     Allergies   IV Location/Drains/Wounds Patient Lines/Drains/Airways Status   Active Line/Drains/Airways    Name:   Placement date:   Placement time:   Site:   Days:   Peripheral IV 03/17/18 Left Antecubital   03/17/18    1402    Antecubital   less than 1   Urethral Catheter AEvans, RN Straight-tip 14 Fr.   03/17/18    1901    Straight-tip   less than 1   Ureteral Drain/Stent Left ureter 6 Fr.   02/25/18    1307    Left ureter   20   Ureteral Drain/Stent Right ureter 6 Fr.   02/25/18    1321    Right ureter   20   External Urinary Catheter   02/26/18    2059    -   19   Incision 10/20/11 Knee Left   10/20/11    1244     2340   Incision 04/11/12  Eye Right   04/11/12    0729     2166   Incision 12/26/12 Eye Left   12/26/12    0925     1907   Incision (Closed) 02/25/18 Perineum Other (Comment)   02/25/18    1341     20          Labs/Imaging Results for orders placed or performed during the hospital encounter of 03/17/18 (from the past 48 hour(s))  Basic metabolic panel     Status: Abnormal   Collection Time: 03/17/18 12:22 PM  Result Value Ref Range   Sodium 133 (L) 135 - 145 mmol/L   Potassium 3.9 3.5 - 5.1 mmol/L   Chloride 101 98 - 111 mmol/L   CO2 20 (L) 22 - 32 mmol/L   Glucose, Bld 174 (H) 70 - 99 mg/dL   BUN 21 8 - 23 mg/dL   Creatinine, Ser 1.29 (H) 0.44 - 1.00 mg/dL   Calcium 8.8 (L) 8.9 - 10.3 mg/dL   GFR calc non Af Amer 40 (L) >60 mL/min   GFR calc Af Amer 46 (L) >60 mL/min   Anion gap 12 5 - 15    Comment: Performed at [facility], [place] [address], [place], [place] [address]  CBC     Status: Abnormal   Collection Time: 03/17/18 12:22 PM  Result Value Ref Range   WBC 9.7 4.0 - 10.5 K/uL   RBC 3.71 (L) 3.87 - 5.11 MIL/uL   Hemoglobin 8.9 (L) 12.0 - 15.0 g/dL   HCT 30.6 (L) 36.0 - 46.0 %   MCV 82.5 80.0 - 100.0 fL   MCH 24.0 (L) 26.0 - 34.0 pg   MCHC 29.1 (L) 30.0 - 36.0 g/dL   RDW 16.0 (H) 11.5 - 15.5 %   Platelets 404 (H) 150 - 400 K/uL   nRBC 0.0 0.0 - 0.2 %    Comment: Performed at [facility], [place] [address] [place] [address] [place], [place] [address]  Differential     Status: None   Collection Time: 03/17/18 12:22 PM  Result Value Ref Range   Neutrophils Relative % 77 %   Neutro Abs 6.1 1.7 - 7.7 K/uL   Lymphocytes Relative 12 %   Lymphs Abs 1.0 0.7 -  4.0 K/uL   Monocytes Relative 10 %   Monocytes Absolute 0.8 0.1 - 1.0 K/uL   Eosinophils Relative 0 %   Eosinophils Absolute 0.0 0.0 - 0.5 K/uL   Basophils Relative 0 %   Basophils Absolute 0.0 0.0 - 0.1 K/uL    Comment: Performed at [facility], [place] [address], [place], [place] [address]  Hepatic  function panel     Status: Abnormal   Collection Time: 03/17/18  1:26 PM  Result Value Ref Range   Total Protein 7.2 6.5 - 8.1 g/dL   Albumin 3.4 (L) 3.5 - 5.0 g/dL   AST 16 15 - 41 U/L   ALT 12 0 - 44 U/L   Alkaline Phosphatase 79 38 - 126 U/L   Total Bilirubin 0.3 0.3 - 1.2 mg/dL   Bilirubin, Direct <0.1 0.0 - 0.2 mg/dL   Indirect Bilirubin NOT CALCULATED 0.3 - 0.9 mg/dL    Comment: Performed at [facility], [place] [address], [place], [place] [address]  Lactic acid, plasma     Status: None   Collection Time: 03/17/18  1:27 PM  Result Value Ref Range   Lactic Acid, Venous 1.1 0.5 - 1.9 mmol/L    Comment: Performed at [facility], [place] [address], [place], [place] [address]  Urinalysis, Routine w reflex microscopic- may I&O cath if menses     Status: Abnormal   Collection Time: 03/17/18  2:47 PM  Result Value Ref Range   Color, Urine YELLOW YELLOW   APPearance CLOUDY (A) CLEAR   Specific Gravity, Urine 1.012 1.005 - 1.030   pH 6.0 5.0 - 8.0   Glucose, UA NEGATIVE NEGATIVE mg/dL   Hgb urine dipstick LARGE (A) NEGATIVE   Bilirubin Urine NEGATIVE NEGATIVE   Ketones, ur NEGATIVE NEGATIVE mg/dL   Protein, ur 100 (A) NEGATIVE mg/dL   Nitrite NEGATIVE NEGATIVE   Leukocytes, UA LARGE (A) NEGATIVE   RBC / HPF >50 (H) 0 - 5 RBC/hpf   WBC, UA >50 (H) 0 - 5 WBC/hpf   Bacteria, UA RARE (A) NONE SEEN   WBC Clumps PRESENT     Comment: Performed at [facility], [place] [address], [place],  [address]  CBG monitoring, ED     Status: Abnormal   Collection Time: 03/17/18  4:08 PM  Result Value Ref Range   Glucose-Capillary 122 (H) 70 - 99 mg/dL   Dg Chest 2 View  Result Date: 03/17/2018 CLINICAL DATA:  Cough, fever x couple days, recently had kidney stents placed cough, fever EXAM: CHEST - 2 VIEW COMPARISON:  Chest radiograph 02/25/2018, CT 01/24/2018 FINDINGS: Stable cardiac silhouette. There is increased RIGHT perihilar /  suprahilar density compared to recent radiograph with a sharp arc like border. Favor this representing combination of the SVC and ectatic ascending aorta. Central venous congestion noted. No pulmonary edema. No pleural fluid. IMPRESSION: 1. Extra shadow along the RIGHT mediastinum is favored benign vascular shadow. 2. Central venous congestion. Electronically Signed   By: Suzy Bouchard M.D.   On: 03/17/2018 15:37   Ct Abdomen Pelvis W Contrast  Result Date: 03/17/2018 CLINICAL DATA:  Left flank pain. Dysuria. Bilateral ureteral stents. EXAM: CT ABDOMEN AND PELVIS WITH CONTRAST TECHNIQUE: Multidetector CT imaging of the abdomen and pelvis was performed using the standard protocol following bolus administration of intravenous contrast. CONTRAST:  53mL OMNIPAQUE IOHEXOL 300 MG/ML  SOLN COMPARISON:  CT scan dated 02/25/2018 FINDINGS: Lower chest: No acute abnormality. Hepatobiliary: No  focal liver abnormality is seen. No gallstones, gallbladder wall thickening, or biliary dilatation. Pancreas: Diffuse atrophy of the pancreas. No mass lesions. No ductal dilatation. Spleen: Normal in size without focal abnormality. Adrenals/Urinary Tract: Adrenal glands are normal. Bilateral ureteral stents are in place. Stable bilateral renal cysts. No hydronephrosis. Bladder appears normal except for the presence of the stents. Stomach/Bowel: Stomach is within normal limits. Appendix appears normal. No evidence of bowel wall thickening, distention, or inflammatory changes. Vascular/Lymphatic: No significant vascular findings are present. No enlarged abdominal or pelvic lymph nodes. Reproductive: Uterus and bilateral adnexa are unremarkable. Other: No abdominal wall hernia or abnormality. No abdominopelvic ascites. 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 Broad-based disc protrusion. Gas is herniated into the right neural foramen. Severe spinal stenosis at that  level, unchanged. IMPRESSION: No acute abnormalities. Bilateral ureteral stents in place with no residual hydronephrosis. 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 Electronically Signed   By: Lorriane Shire M.D.   On: 03/17/2018 17:07   EKG Interpretation  Date/Time:  Friday March 17 2018 13:55:05 EST Ventricular Rate:  103 PR Interval:    QRS Duration: 92 QT Interval:  330 QTC Calculation: 432 R Axis:   27 Text Interpretation:  Sinus tachycardia Ventricular premature complex Abnormal R-wave progression, early transition No significant change since last tracing Confirmed by Gareth Morgan [phone]) on 03/17/2018 3:41:09 PM   Pending Labs Unresulted Labs (From admission, onward)    Start     Ordered   03/17/18 1836  Influenza panel by PCR (type A & B)  (Influenza PCR Panel)  Once,   R     03/17/18 1835   03/17/18 1327  Lactic acid, plasma  Now then every 2 hours,   STAT     03/17/18 1328   03/17/18 1327  Blood culture (routine x 2)  BLOOD CULTURE X 2,   STAT     03/17/18 1328   03/17/18 1318  Urine culture  ONCE - STAT,   STAT     03/17/18 1318          Vitals/Pain Today's Vitals   03/17/18 1730 03/17/18 1800 03/17/18 1830 03/17/18 1900  BP: (!) 161/88 (!) 155/74 (!) 164/75 (!) 159/73  Pulse: (!) 104 (!) 102 (!) 103 (!) 105  Resp: 19 20 (!) 21 (!) 24  Temp:      TempSrc:      SpO2: 95% 98% 100% 97%  PainSc:        Isolation Precautions Droplet precaution  Medications Medications  iopamidol (ISOVUE-300) 61 % injection (has no administration in time range)  sodium chloride (PF) 0.9 % injection (has no administration in time range)  acetaminophen (TYLENOL) tablet 1,000 mg (has no administration in time range)  colchicine tablet 0.6 mg (has no administration in time range)  oxyCODONE-acetaminophen (PERCOCET) 10-325 MG per tablet 1 tablet (has no administration in time range)  SUMAtriptan (IMITREX) nasal spray 5  mg (has no administration in time range)  amLODipine (NORVASC) tablet 10 mg (has no administration in time range)  atorvastatin (LIPITOR) tablet 40 mg (has no administration in time range)  cloNIDine (CATAPRES) tablet 0.2 mg (has no administration in time range)  busPIRone (BUSPAR) tablet 7.5 mg (has no administration in time range)  donepezil (ARICEPT) tablet 10 mg (has no administration in time range)  Fluoxetine HCl (PMDD) TABS 10 mg (has no administration in time range)  hydrOXYzine (ATARAX/VISTARIL) tablet  25 mg (has no administration in time range)  imipramine (TOFRANIL) tablet 50 mg (has no administration in time range)  temazepam (RESTORIL) capsule 30 mg (has no administration in time range)  Insulin Glargine (LANTUS) Solostar Pen 40 Units (has no administration in time range)  meclizine (ANTIVERT) tablet 25 mg (has no administration in time range)  dabigatran (PRADAXA) capsule 150 mg (has no administration in time range)  senna-docusate (Senokot-S) tablet 2 tablet (has no administration in time range)  pantoprazole (PROTONIX) EC tablet 40 mg (has no administration in time range)  topiramate (TOPAMAX) tablet 100 mg (has no administration in time range)  olopatadine (PATANOL) 0.1 % ophthalmic solution 1 drop (has no administration in time range)  nystatin (MYCOSTATIN/NYSTOP) topical powder (has no administration in time range)  brimonidine-timolol (COMBIGAN) 0.2-0.5 % ophthalmic solution 1 drop (has no administration in time range)  montelukast (SINGULAIR) tablet 10 mg (has no administration in time range)  chlorpheniramine-HYDROcodone (TUSSIONEX) 10-8 MG/5ML suspension 5 mL (has no administration in time range)  morphine 2 MG/ML injection 1-2 mg (has no administration in time range)  ipratropium-albuterol (DUONEB) 0.5-2.5 (3) MG/3ML nebulizer solution 3 mL (has no administration in time range)  ceFEPIme (MAXIPIME) 1 g in sodium chloride 0.9 % 100 mL IVPB (has no administration in time  range)  sodium chloride 0.9 % bolus 500 mL (0 mLs Intravenous Stopped 03/17/18 1430)  sodium chloride 0.9 % bolus 500 mL (0 mLs Intravenous Stopped 03/17/18 1535)  ceFEPIme (MAXIPIME) 1 g in sodium chloride 0.9 % 100 mL IVPB (0 g Intravenous Stopped 03/17/18 1535)  oxyCODONE (Oxy IR/ROXICODONE) immediate release tablet 5 mg (5 mg Oral Given 03/17/18 1535)  morphine 4 MG/ML injection 4 mg (4 mg Intravenous Given 03/17/18 1627)  sodium chloride 0.9 % bolus 1,000 mL (1,000 mLs Intravenous Bolus 03/17/18 1700)  iohexol (OMNIPAQUE) 300 MG/ML solution 100 mL (80 mLs Intravenous Contrast Given 03/17/18 1633)    Mobility non-ambulatory

## 2018-03-17 NOTE — ED Notes (Signed)
Pt sister [name] states for any inquires please call anytime, number on file.

## 2018-03-17 NOTE — ED Notes (Signed)
ED TO INPATIENT HANDOFF REPORT  Name/Age/Gender [name] 78 y.o. female  Code Status Code Status History    Date Active Date Inactive Code Status Order ID Comments User Context   02/25/2018 0523 02/27/2018 1659 DNR [id number]  Oswald Hillock, MD Inpatient   01/25/2018 0008 01/27/2018 1729 DNR [id number]  Oswald Hillock, MD ED   06/19/2017 [phone] 06/22/2017 1838 DNR [id number]  Oswald Hillock, MD Inpatient   08/14/2016 0105 08/15/2016 1631 Full Code [id number]  Oswald Hillock, MD Inpatient    Questions for Most Recent Historical Code Status (Order [id number])    Question Answer Comment   In the event of cardiac or respiratory ARREST Do not call a "code blue"    In the event of cardiac or respiratory ARREST Do not perform Intubation, CPR, defibrillation or ACLS    In the event of cardiac or respiratory ARREST Use medication by any route, position, wound care, and other measures to relive pain and suffering. May use oxygen, suction and manual treatment of airway obstruction as needed for comfort.         Advance Directive Documentation     Most Recent Value  Type of Advance Directive  Healthcare Power of Attorney  Pre-existing out of facility DNR order (yellow form or pink MOST form)  -  "MOST" Form in Place?  -      Home/SNF/Other Home  Chief Complaint pain,fever  Level of Care/Admitting Diagnosis ED Disposition    ED Disposition Condition [place]: [facility] [[id number]]  Level of Care: Telemetry [5]  Admit to tele based on following criteria: Complex arrhythmia (Bradycardia/Tachycardia)  Diagnosis: UTI (urinary tract infection) [329924]  Admitting Physician: Hosie Poisson [[id number]]  Attending Physician: Hosie Poisson [[id number]]  PT Class (Do Not Modify): Observation [104]  PT Acc Code (Do Not Modify): Observation [10022]       Medical History Past Medical History:  Diagnosis Date  . Anemia, iron deficiency 2012   Evaluated by Dr. Oneida Alar; H&H  of 9.3/30.8 with [facility] in 10/2010; 3/3 positive Hemoccult cards in 07/2011  . Anxiety    was on Prozac for a couple of weeks  . Atrial fibrillation ([place])    takes Coumadin daily  . Bruises easily    takes Coumadin  . Chronic anticoagulation 07/21/2010  . Chronic back pain    related to knee pain  . Degenerative joint disease    Knees  . Dementia ([place])   . Depression   . Diabetes mellitus    takes Metformin daily  . Gastroesophageal reflux disease    takes Omeprazole daily  . Glaucoma   . Glaucoma    uses eye drops at night  . History of blood transfusion 1969  . History of gout    was on medication but taken off of several months ago  . Hx of colonic polyps   . Hx of migraines    last one about a yr ago;takes Topamax daily  . Hyperlipidemia    takes Pravastatin daily  . Hypertension    takes Hyzaar daily and Propranolol   . Insomnia    takes Restoril prn  . Joint pain   . Joint swelling   . Memory loss    takes donepezil  . Migraine   . Migraine   . Nocturia   . Overweight(278.02)   . Pneumonia 1969   hx of  . Pulmonary embolism [facility]) May 2012   acute presentation,  bilateral PE  . Skin spots-aging   . Urinary frequency     Allergies   IV Location/Drains/Wounds Patient Lines/Drains/Airways Status   Active Line/Drains/Airways    Name:   Placement date:   Placement time:   Site:   Days:   Peripheral IV 03/17/18 Left Antecubital   03/17/18    1402    Antecubital   less than 1   Ureteral Drain/Stent Left ureter 6 Fr.   02/25/18    1307    Left ureter   20   Ureteral Drain/Stent Right ureter 6 Fr.   02/25/18    1321    Right ureter   20   External Urinary Catheter   06/20/17    0900    -   270   External Urinary Catheter   02/26/18    2059    -   19   Incision 10/20/11 Knee Left   10/20/11    1244     2340   Incision 04/11/12 Eye Right   04/11/12    0729     2166   Incision 12/26/12 Eye Left   12/26/12    0925     1907   Incision (Closed) 02/25/18 Perineum Other  (Comment)   02/25/18    1341     20          Labs/Imaging Results for orders placed or performed during the hospital encounter of 03/17/18 (from the past 48 hour(s))  Basic metabolic panel     Status: Abnormal   Collection Time: 03/17/18 12:22 PM  Result Value Ref Range   Sodium 133 (L) 135 - 145 mmol/L   Potassium 3.9 3.5 - 5.1 mmol/L   Chloride 101 98 - 111 mmol/L   CO2 20 (L) 22 - 32 mmol/L   Glucose, Bld 174 (H) 70 - 99 mg/dL   BUN 21 8 - 23 mg/dL   Creatinine, Ser 1.29 (H) 0.44 - 1.00 mg/dL   Calcium 8.8 (L) 8.9 - 10.3 mg/dL   GFR calc non Af Amer 40 (L) >60 mL/min   GFR calc Af Amer 46 (L) >60 mL/min   Anion gap 12 5 - 15    Comment: Performed at [facility], [place] [address] [place], [place] [address]  CBC     Status: Abnormal   Collection Time: 03/17/18 12:22 PM  Result Value Ref Range   WBC 9.7 4.0 - 10.5 K/uL   RBC 3.71 (L) 3.87 - 5.11 MIL/uL   Hemoglobin 8.9 (L) 12.0 - 15.0 g/dL   HCT 30.6 (L) 36.0 - 46.0 %   MCV 82.5 80.0 - 100.0 fL   MCH 24.0 (L) 26.0 - 34.0 pg   MCHC 29.1 (L) 30.0 - 36.0 g/dL   RDW 16.0 (H) 11.5 - 15.5 %   Platelets 404 (H) 150 - 400 K/uL   nRBC 0.0 0.0 - 0.2 %    Comment: Performed at [facility], [place] [address], [place], [place] [address]  Differential     Status: None   Collection Time: 03/17/18 12:22 PM  Result Value Ref Range   Neutrophils Relative % 77 %   Neutro Abs 6.1 1.7 - 7.7 K/uL   Lymphocytes Relative 12 %   Lymphs Abs 1.0 0.7 - 4.0 K/uL   Monocytes Relative 10 %   Monocytes Absolute 0.8 0.1 - 1.0 K/uL   Eosinophils Relative 0 %   Eosinophils Absolute 0.0 0.0 - 0.5 K/uL  Basophils Relative 0 %   Basophils Absolute 0.0 0.0 - 0.1 K/uL    Comment: Performed at [facility], [place] [address] [place], [place] [address]  Hepatic function panel     Status: Abnormal   Collection Time: 03/17/18  1:26 PM  Result Value Ref Range   Total Protein 7.2 6.5 - 8.1 g/dL   Albumin  3.4 (L) 3.5 - 5.0 g/dL   AST 16 15 - 41 U/L   ALT 12 0 - 44 U/L   Alkaline Phosphatase 79 38 - 126 U/L   Total Bilirubin 0.3 0.3 - 1.2 mg/dL   Bilirubin, Direct <0.1 0.0 - 0.2 mg/dL   Indirect Bilirubin NOT CALCULATED 0.3 - 0.9 mg/dL    Comment: Performed at [facility], [place] [address], [place], [place] [address]  Lactic acid, plasma     Status: None   Collection Time: 03/17/18  1:27 PM  Result Value Ref Range   Lactic Acid, Venous 1.1 0.5 - 1.9 mmol/L    Comment: Performed at [facility], [place] [address], [place], [place] [address]  Urinalysis, Routine w reflex microscopic- may I&O cath if menses     Status: Abnormal   Collection Time: 03/17/18  2:47 PM  Result Value Ref Range   Color, Urine YELLOW YELLOW   APPearance CLOUDY (A) CLEAR   Specific Gravity, Urine 1.012 1.005 - 1.030   pH 6.0 5.0 - 8.0   Glucose, UA NEGATIVE NEGATIVE mg/dL   Hgb urine dipstick LARGE (A) NEGATIVE   Bilirubin Urine NEGATIVE NEGATIVE   Ketones, ur NEGATIVE NEGATIVE mg/dL   Protein, ur 100 (A) NEGATIVE mg/dL   Nitrite NEGATIVE NEGATIVE   Leukocytes, UA LARGE (A) NEGATIVE   RBC / HPF >50 (H) 0 - 5 RBC/hpf   WBC, UA >50 (H) 0 - 5 WBC/hpf   Bacteria, UA RARE (A) NONE SEEN   WBC Clumps PRESENT     Comment: Performed at [facility], [place] [address], [place], [place] [address]  CBG monitoring, ED     Status: Abnormal   Collection Time: 03/17/18  4:08 PM  Result Value Ref Range   Glucose-Capillary 122 (H) 70 - 99 mg/dL   Dg Chest 2 View  Result Date: 03/17/2018 CLINICAL DATA:  Cough, fever x couple days, recently had kidney stents placed cough, fever EXAM: CHEST - 2 VIEW COMPARISON:  Chest radiograph 02/25/2018, CT 01/24/2018 FINDINGS: Stable cardiac silhouette. There is increased RIGHT perihilar / suprahilar density compared to recent radiograph with a sharp arc like border. Favor this representing combination of the SVC and ectatic ascending aorta.  Central venous congestion noted. No pulmonary edema. No pleural fluid. IMPRESSION: 1. Extra shadow along the RIGHT mediastinum is favored benign vascular shadow. 2. Central venous congestion. Electronically Signed   By: Suzy Bouchard M.D.   On: 03/17/2018 15:37   Ct Abdomen Pelvis W Contrast  Result Date: 03/17/2018 CLINICAL DATA:  Left flank pain. Dysuria. Bilateral ureteral stents. EXAM: CT ABDOMEN AND PELVIS WITH CONTRAST TECHNIQUE: Multidetector CT imaging of the abdomen and pelvis was performed using the standard protocol following bolus administration of intravenous contrast. CONTRAST:  72mL OMNIPAQUE IOHEXOL 300 MG/ML  SOLN COMPARISON:  CT scan dated 02/25/2018 FINDINGS: Lower chest: No acute abnormality. Hepatobiliary: No focal liver abnormality is seen. No gallstones, gallbladder wall thickening, or biliary dilatation. Pancreas: Diffuse atrophy of the pancreas. No mass lesions. No ductal dilatation. Spleen: Normal in size without focal abnormality. Adrenals/Urinary Tract:  Adrenal glands are normal. Bilateral ureteral stents are in place. Stable bilateral renal cysts. No hydronephrosis. Bladder appears normal except for the presence of the stents. Stomach/Bowel: Stomach is within normal limits. Appendix appears normal. No evidence of bowel wall thickening, distention, or inflammatory changes. Vascular/Lymphatic: No significant vascular findings are present. No enlarged abdominal or pelvic lymph nodes. Reproductive: Uterus and bilateral adnexa are unremarkable. Other: No abdominal wall hernia or abnormality. No abdominopelvic ascites. 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 Broad-based disc protrusion. Gas is herniated into the right neural foramen. Severe spinal stenosis at that level, unchanged. IMPRESSION: No acute abnormalities. Bilateral ureteral stents in place with no residual hydronephrosis. 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 Electronically Signed   By: Lorriane Shire M.D.   On: 03/17/2018 17:07   EKG Interpretation  Date/Time:  Friday March 17 2018 13:55:05 EST Ventricular Rate:  103 PR Interval:    QRS Duration: 92 QT Interval:  330 QTC Calculation: 432 R Axis:   27 Text Interpretation:  Sinus tachycardia Ventricular premature complex Abnormal R-wave progression, early transition No significant change since last tracing Confirmed by Gareth Morgan [id number]) on 03/17/2018 3:41:09 PM   Pending Labs Unresulted Labs (From admission, onward)    Start     Ordered   03/17/18 1327  Lactic acid, plasma  Now then every 2 hours,   STAT     03/17/18 1328   03/17/18 1327  Blood culture (routine x 2)  BLOOD CULTURE X 2,   STAT     03/17/18 1328   03/17/18 1318  Urine culture  ONCE - STAT,   STAT     03/17/18 1318          Vitals/Pain Today's Vitals   03/17/18 1534 03/17/18 1600 03/17/18 1700 03/17/18 1729  BP: (!) 158/92 (!) 156/102 (!) 157/69   Pulse: (!) 103 (!) 105 (!) 105   Resp: 18 (!) 32 (!) 23   Temp:      TempSrc:      SpO2: 95% 96% 97%   PainSc:    3     Isolation Precautions No active isolations  Medications Medications  sodium chloride 0.9 % bolus 1,000 mL (1,000 mLs Intravenous Bolus 03/17/18 1700)  iopamidol (ISOVUE-300) 61 % injection (has no administration in time range)  sodium chloride (PF) 0.9 % injection (has no administration in time range)  sodium chloride 0.9 % bolus 500 mL (0 mLs Intravenous Stopped 03/17/18 1430)  sodium chloride 0.9 % bolus 500 mL (0 mLs Intravenous Stopped 03/17/18 1535)  ceFEPIme (MAXIPIME) 1 g in sodium chloride 0.9 % 100 mL IVPB (0 g Intravenous Stopped 03/17/18 1535)  oxyCODONE (Oxy IR/ROXICODONE) immediate release tablet 5 mg (5 mg Oral Given 03/17/18 1535)  morphine 4 MG/ML injection 4 mg (4 mg Intravenous Given 03/17/18 1627)  iohexol (OMNIPAQUE) 300 MG/ML solution 100 mL (80 mLs Intravenous  Contrast Given 03/17/18 1633)    Mobility non-ambulatory

## 2018-03-17 NOTE — H&P (Signed)
History and Physical    MYLES [name] QIW:[id number] DOB: 12/27/1940 DOA: 03/17/2018  PCP: [name], MD  Patient coming from: [facility].   I have personally briefly reviewed patient's old medical records in [place]  Chief Complaint: flank pain since discharge and sob, coughing and wheezing and fever since 2 days.   HPI: [name] is a 78 y.o. female with medical history significant of iron deficiency anemia, atrial fibrillation on pradaxa, dementia, DM, pulmonary embolism in the past, migraines, h/o ureteral stones recently admitted for hydronephrosis and underwent Cystoscopy, bilateral retrograde pyelogram, bilateral ureteral stent placement; Bladder biopsy fulguration 0.5 to 2 cm by Dr Junious Silk and discharged on keflex presents with urinary symptoms, fevers and flank pain since 2 to 3 days and associated sob, wheezing and coughing. No chest pain. She reports some nausea, no vomiting . No diarrhea or hematemesis.  She reports headache and dizziness from coughing and headache.     ED Course: on arrival to ED, she was febrile at 100.1, tachycardic, tachypnea, and labs show normal wbc count, hemoglobin of 8.9, sodium of 133 and creatinine of 1.29. lactic acid of 1.1   UA is positive for large leukocytes and wbc clumps.  She underwent blood and urine cultures and urology consulted.  Referred admission to hospitalist service for complicated UTI.   Review of Systems: As per HPI otherwise 10 point review of systems negative.   Past Medical History:  Diagnosis Date  . Anemia, iron deficiency 2012   Evaluated by Dr. Oneida Alar; H&H of 9.3/30.8 with [place] in 10/2010; 3/3 positive Hemoccult cards in 07/2011  . Anxiety    was on Prozac for a couple of weeks  . Atrial fibrillation ([place])    takes Coumadin daily  . Bruises easily    takes Coumadin  . Chronic anticoagulation 07/21/2010  . Chronic back pain    related to knee pain  . Degenerative joint disease    Knees  . Dementia  ([place])   . Depression   . Diabetes mellitus    takes Metformin daily  . Gastroesophageal reflux disease    takes Omeprazole daily  . Glaucoma   . Glaucoma    uses eye drops at night  . History of blood transfusion 1969  . History of gout    was on medication but taken off of several months ago  . Hx of colonic polyps   . Hx of migraines    last one about a yr ago;takes Topamax daily  . Hyperlipidemia    takes Pravastatin daily  . Hypertension    takes Hyzaar daily and Propranolol   . Insomnia    takes Restoril prn  . Joint pain   . Joint swelling   . Memory loss    takes donepezil  . Migraine   . Migraine   . Nocturia   . Overweight(278.02)   . Pneumonia 1969   hx of  . Pulmonary embolism [facility]) May 2012   acute presentation, bilateral PE  . Skin spots-aging   . Urinary frequency     Past Surgical History:  Procedure Laterality Date  . CATARACT EXTRACTION W/PHACO Right 04/11/2012   Procedure: CATARACT EXTRACTION PHACO AND INTRAOCULAR LENS PLACEMENT (IOC);  Surgeon: Elta Guadeloupe T. Gershon Crane, MD;  Location: [facility] ORS;  Service: Ophthalmology;  Laterality: Right;  CDE=9.61  . CATARACT EXTRACTION W/PHACO Left 12/26/2012   Procedure: CATARACT EXTRACTION PHACO AND INTRAOCULAR LENS PLACEMENT (IOC);  Surgeon: Elta Guadeloupe T. Gershon Crane, MD;  Location: [facility]  ORS;  Service: Ophthalmology;  Laterality: Left;  CDE:7.74  . COLONOSCOPY  Jan 2002; 2012   2002: Dr. Tamala Julian, ext. hemorrhoids, nl colon; 2012-adenomatous polyps, gastritis on EGD  . DILATION AND CURETTAGE OF UTERUS    . ESOPHAGOGASTRODUODENOSCOPY  08/24/2011   ESOPHAGOGASTRODUODENOSCOPY; esophageal dilatation; Rogene Houston, MD;  . Freda Munro CAPSULE STUDY  08/18/2011   Procedure: GIVENS CAPSULE STUDY;  Surgeon: Rogene Houston, MD;  Location: [facility] ENDO SUITE;  Service: Endoscopy;  Laterality: N/A;  730  . KNEE ARTHROSCOPY W/ MENISCECTOMY  90's   Left  . ORIF ANKLE FRACTURE Right 90's  . TONSILLECTOMY    . TOTAL KNEE ARTHROPLASTY  10/20/2011   Procedure:  TOTAL KNEE ARTHROPLASTY;  Surgeon: Ninetta Lights, MD;  Location: [place];  Service: Orthopedics;  Laterality: Left;  . UPPER GASTROINTESTINAL ENDOSCOPY    . URETHRAL DILATION       reports that she has never smoked. She has never used smokeless tobacco. She reports that she does not drink alcohol or use drugs.    Family History  Problem Relation Age of Onset  . Diabetes Mother   . Hypertension Mother   . Arthritis Mother   . Heart failure Mother   . Migraines Mother   . Kidney failure Mother   . Leukemia Father   . Migraines Father   . Kidney failure Father   . Diabetes Sister   . Hypertension Sister   . Diabetes Brother   . Hypertension Brother   . Hypertension Brother   . Hypertension Brother   . Hypertension Brother   . Diabetes Brother   . Diabetes Brother   . Diabetes Brother   . Pulmonary embolism Sister   . Migraines Sister   . Kidney failure Brother   . Colon cancer Neg Hx   . Colon polyps Neg Hx     Family history reviewed and not pertinent  Prior to Admission medications   Medication Sig Start Date End Date Taking? Authorizing Provider  acetaminophen (TYLENOL) 500 MG tablet Take 1,000 mg by mouth every 6 (six) hours as needed for mild pain or fever.    Yes [provider]  acyclovir (ZOVIRAX) 400 MG tablet TAKE 1 TABLET (400 MG TOTAL) BY MOUTH 3 (THREE) TIMES DAILY. 09/12/17  Yes [name], MD  albuterol (VENTOLIN HFA) 108 (90 Base) MCG/ACT inhaler Inhale 2 puffs into the lungs every 6 (six) hours as needed for wheezing or shortness of breath. 02/27/18  Yes Emokpae, Courage, MD  amLODipine (NORVASC) 10 MG tablet Take 1 tablet (10 mg total) by mouth daily. 02/27/18  Yes Emokpae, Courage, MD  atorvastatin (LIPITOR) 40 MG tablet TAKE 1 TABLET BY MOUTH EVERY DAY Patient taking differently: Take 40 mg by mouth daily.  09/26/17  Yes [name], MD  Blood Glucose Monitoring Suppl (ACCU-CHEK AVIVA PLUS) w/Device KIT For once daily testing dx  E11.9 11/24/15  Yes [name], MD  busPIRone (BUSPAR) 7.5 MG tablet TAKE 1 TABLET BY MOUTH 3 TIMES DAILY Patient taking differently: Take 7.5 mg by mouth 3 (three) times daily.  12/23/17  Yes [name], MD  chlorpheniramine-HYDROcodone [facility] PENNKINETIC ER) 10-8 MG/5ML SUER Take one teaspoon at bedtime as needed for excessive cough 03/15/18  Yes [name], MD  cloNIDine (CATAPRES) 0.2 MG tablet TAKE 1 TABLET (0.2 MG TOTAL) BY MOUTH 3 (THREE) TIMES DAILY. Patient taking differently: Take 0.6 mg by mouth at bedtime.  10/04/17  Yes [name], MD  colchicine 0.6 MG tablet Take 1 tablet (0.6 mg total) by mouth daily. May decrease to once per day as needed for diarrhea 06/27/17  Yes Tanna Furry, MD  COMBIGAN 0.2-0.5 % ophthalmic solution Place 1 drop into both eyes 2 (two) times daily. 03/23/17  Yes [provider]  conjugated estrogens (PREMARIN) vaginal cream Place vaginally 2 (two) times a week. Apply sparingly ( fingertip)  Twice weekly to vaginal area on Wednesdays and Fridays 01/30/18  Yes Johnson, Clanford L, MD  dabigatran (PRADAXA) 150 MG CAPS capsule Take 1 capsule (150 mg total) by mouth 2 (two) times daily. 02/27/18  Yes Emokpae, Courage, MD  donepezil (ARICEPT) 10 MG tablet TAKE 1 TABLET BY MOUTH EVERYDAY AT BEDTIME Patient taking differently: Take 10 mg by mouth at bedtime.  02/13/18  Yes [name], MD  doxycycline (VIBRA-TABS) 100 MG tablet Take 1 tablet (100 mg total) by mouth 2 (two) times daily. 03/16/18  Yes [name], MD  Fluoxetine HCl, PMDD, 10 MG TABS Take 1 tablet (10 mg total) by mouth daily. 01/27/18  Yes Johnson, Clanford L, MD  hydrOXYzine (ATARAX/VISTARIL) 25 MG tablet Take 1 tablet (25 mg total) by mouth 3 (three) times daily as needed for itching. 01/27/18  Yes Johnson, Clanford L, MD  imipramine (TOFRANIL) 50 MG tablet Take 1 tablet (50 mg total) by mouth at bedtime. 02/27/18  Yes Emokpae, Courage, MD  Insulin  Glargine (LANTUS SOLOSTAR) 100 UNIT/ML Solostar Pen Inject 40 Units into the skin 2 (two) times daily. 01/27/18  Yes Johnson, Clanford L, MD  meclizine (ANTIVERT) 25 MG tablet TAKE 1 TABLET THREE TIMES DAILY AS NEEDED FOR DIZZINESS. Patient taking differently: Take 25 mg by mouth 3 (three) times daily as needed for nausea.  02/01/18  Yes [name], MD  montelukast (SINGULAIR) 10 MG tablet TAKE 1 TABLET BY MOUTH AT BEDTIME Patient taking differently: Take 10 mg by mouth at bedtime.  08/23/17  Yes [name], MD  nitrofurantoin (MACRODANTIN) 100 MG capsule Take 100 mg by mouth at bedtime.   Yes [provider]  nystatin (MYCOSTATIN/NYSTOP) powder Apply topically daily as needed (for rash under breasts). 01/27/18  Yes Johnson, Clanford L, MD  olopatadine (PATANOL) 0.1 % ophthalmic solution Place 1 drop into both eyes 2 (two) times daily. 01/27/18  Yes Johnson, Clanford L, MD  omeprazole (PRILOSEC) 40 MG capsule Take 1 capsule (40 mg total) by mouth 2 (two) times daily. 01/27/18  Yes Johnson, Clanford L, MD  oxyCODONE (OXY IR/ROXICODONE) 5 MG immediate release tablet Take one tablet in at midday for uncontrolled pain 03/15/18 04/15/18 Yes [name], MD  oxyCODONE-acetaminophen (PERCOCET) 10-325 MG tablet Take 1 tablet by mouth 2 (two) times daily.   Yes [provider]  senna-docusate (SENOKOT-S) 8.6-50 MG tablet Take 2 tablets by mouth at bedtime. Patient taking differently: Take 2 tablets by mouth 2 (two) times daily.  02/27/18 02/27/19 Yes Emokpae, Courage, MD  SUMAtriptan (IMITREX) 5 MG/ACT nasal spray Place 1 spray into the nose every 2 (two) hours as needed for migraine.  01/16/17  Yes [provider]  temazepam (RESTORIL) 30 MG capsule Take 30 mg by mouth at bedtime as needed for sleep.   Yes [provider]  topiramate (TOPAMAX) 100 MG tablet TAKE 1 TABLET BY MOUTH TWICE A DAY Patient taking differently: Take 100 mg by mouth 2 (two) times  daily.  09/12/17  Yes [name], MD  Vitamin D, Ergocalciferol, (DRISDOL) 50000 units CAPS capsule  TAKE 1 CAPSULE (50,000 UNITS TOTAL) BY MOUTH ONCE A WEEK. Patient taking differently: Take 50,000 Units by mouth every 7 (seven) days. Friday 11/18/17  Yes [name], MD  glucose blood (ONE TOUCH ULTRA TEST) test strip USE TO TEST TWICE DAILY 12/26/17   [name], MD    Physical Exam: Vitals:   03/17/18 1534 03/17/18 1600 03/17/18 1700 03/17/18 1730  BP: (!) 158/92 (!) 156/102 (!) 157/69 (!) 161/88  Pulse: (!) 103 (!) 105 (!) 105 (!) 104  Resp: 18 (!) 32 (!) 23 19  Temp:      TempSrc:      SpO2: 95% 96% 97% 95%    Constitutional: IN MOD distress form coughing and flank pain.  Vitals:   03/17/18 1534 03/17/18 1600 03/17/18 1700 03/17/18 1730  BP: (!) 158/92 (!) 156/102 (!) 157/69 (!) 161/88  Pulse: (!) 103 (!) 105 (!) 105 (!) 104  Resp: 18 (!) 32 (!) 23 19  Temp:      TempSrc:      SpO2: 95% 96% 97% 95%   Eyes: PERRL, lids and conjunctivae normal ENMT: Mucous membranes are dry  Neck: normal, supple, no masses, no thyromegaly Respiratory: bilateral exp wheezing, air entry fair.  Cardiovascular: tachycardic, no murmer heard. S1S2 heard.  Abdomen: supra pubic tenderness and bowel sound sgood. abd is soft and mild tenderness generalized.   Musculoskeletal: no clubbing / cyanosis. No joint deformity upper and lower extremities. Skin: no rashes, lesions, ulcers. No induration Neurologic: CN 2-12 grossly intact. Sensation intact,  Psychiatric:Alert and oriented x 3. Anxious.     Labs on Admission: I have personally reviewed following labs and imaging studies  CBC: Recent Labs  Lab 03/15/18 1506 03/17/18 1222  WBC 14.1* 9.7  NEUTROABS  --  6.1  HGB 9.1* 8.9*  HCT 29.2* 30.6*  MCV 76.8* 82.5  PLT 564* 811*   Basic Metabolic Panel: Recent Labs  Lab 03/15/18 1506 03/17/18 1222  NA 138 133*  K 4.1 3.9  CL 105 101  CO2 18* 20*  GLUCOSE 176*  174*  BUN 26* 21  CREATININE 1.19* 1.29*  CALCIUM 9.5 8.8*   GFR: Estimated Creatinine Clearance: 44.3 mL/min (A) (by C-G formula based on SCr of 1.29 mg/dL (H)). Liver Function Tests: Recent Labs  Lab 03/17/18 1326  AST 16  ALT 12  ALKPHOS 79  BILITOT 0.3  PROT 7.2  ALBUMIN 3.4*   No results for input(s): LIPASE, AMYLASE in the last 168 hours. No results for input(s): AMMONIA in the last 168 hours. Coagulation Profile: No results for input(s): INR, PROTIME in the last 168 hours. Cardiac Enzymes: No results for input(s): CKTOTAL, CKMB, CKMBINDEX, TROPONINI in the last 168 hours. BNP (last 3 results) No results for input(s): PROBNP in the last 8760 hours. HbA1C: No results for input(s): HGBA1C in the last 72 hours. CBG: Recent Labs  Lab 03/17/18 1608  GLUCAP 122*   Lipid Profile: No results for input(s): CHOL, HDL, LDLCALC, TRIG, CHOLHDL, LDLDIRECT in the last 72 hours. Thyroid Function Tests: No results for input(s): TSH, T4TOTAL, FREET4, T3FREE, THYROIDAB in the last 72 hours. Anemia Panel: No results for input(s): VITAMINB12, FOLATE, FERRITIN, TIBC, IRON, RETICCTPCT in the last 72 hours. Urine analysis:    Component Value Date/Time   COLORURINE YELLOW 03/17/2018 1447   APPEARANCEUR CLOUDY (A) 03/17/2018 1447   LABSPEC 1.012 03/17/2018 1447   PHURINE 6.0 03/17/2018 1447   GLUCOSEU NEGATIVE 03/17/2018 1447   HGBUR LARGE (A) 03/17/2018 1447  HGBUR negative 01/06/2010 0929   BILIRUBINUR NEGATIVE 03/17/2018 1447   BILIRUBINUR moderate 11/17/2016 1438   KETONESUR NEGATIVE 03/17/2018 1447   PROTEINUR 100 (A) 03/17/2018 1447   UROBILINOGEN >=8.0 (A) 11/17/2016 1438   UROBILINOGEN 0.2 10/12/2011 1556   NITRITE NEGATIVE 03/17/2018 1447   LEUKOCYTESUR LARGE (A) 03/17/2018 1447    Radiological Exams on Admission: Dg Chest 2 View  Result Date: 03/17/2018 CLINICAL DATA:  Cough, fever x couple days, recently had kidney stents placed cough, fever EXAM: CHEST - 2 VIEW  COMPARISON:  Chest radiograph 02/25/2018, CT 01/24/2018 FINDINGS: Stable cardiac silhouette. There is increased RIGHT perihilar / suprahilar density compared to recent radiograph with a sharp arc like border. Favor this representing combination of the SVC and ectatic ascending aorta. Central venous congestion noted. No pulmonary edema. No pleural fluid. IMPRESSION: 1. Extra shadow along the RIGHT mediastinum is favored benign vascular shadow. 2. Central venous congestion. Electronically Signed   By: Suzy Bouchard M.D.   On: 03/17/2018 15:37   Ct Abdomen Pelvis W Contrast  Result Date: 03/17/2018 CLINICAL DATA:  Left flank pain. Dysuria. Bilateral ureteral stents. EXAM: CT ABDOMEN AND PELVIS WITH CONTRAST TECHNIQUE: Multidetector CT imaging of the abdomen and pelvis was performed using the standard protocol following bolus administration of intravenous contrast. CONTRAST:  43m OMNIPAQUE IOHEXOL 300 MG/ML  SOLN COMPARISON:  CT scan dated 02/25/2018 FINDINGS: Lower chest: No acute abnormality. Hepatobiliary: No focal liver abnormality is seen. No gallstones, gallbladder wall thickening, or biliary dilatation. Pancreas: Diffuse atrophy of the pancreas. No mass lesions. No ductal dilatation. Spleen: Normal in size without focal abnormality. Adrenals/Urinary Tract: Adrenal glands are normal. Bilateral ureteral stents are in place. Stable bilateral renal cysts. No hydronephrosis. Bladder appears normal except for the presence of the stents. Stomach/Bowel: Stomach is within normal limits. Appendix appears normal. No evidence of bowel wall thickening, distention, or inflammatory changes. Vascular/Lymphatic: No significant vascular findings are present. No enlarged abdominal or pelvic lymph nodes. Reproductive: Uterus and bilateral adnexa are unremarkable. Other: No abdominal wall hernia or abnormality. No abdominopelvic ascites. Musculoskeletal: Chronic severe degenerative disc and joint disease at L4-5 with 10 mm  spondylolisthesis. Broad-based disc protrusion. Gas is herniated into the right neural foramen. Severe spinal stenosis at that level, unchanged. IMPRESSION: No acute abnormalities. Bilateral ureteral stents in place with no residual hydronephrosis. 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 Electronically Signed   By: JLorriane ShireM.D.   On: 03/17/2018 17:07    EKG: Independently reviewed. Sinus tachycardia.   Assessment/Plan Active Problems:   UTI (urinary tract infection)  Complicated UTI:  Urine and blood cultures done and pending. Follow.  Continue with cefepime.  Urology consulted and recommendations given.  Gentle hydration.   H/o recent hydronephrosis:  Pt underwent  Cystoscopy, bilateral retrograde pyelogram, bilateral ureteral stent placement; Bladder biopsy fulguration 0.5 to 2 cm, last admission and her CT today does not show any residual hydronephrosis.   Paroxysmal atrial fib  Currently in sinus tachycardia.    Pulmonary embolism: Resume Pradaxa.    Hypertension:  Sub optimal resume home meds.    Sob, wheezing and cough:  Acute bronchitis.  Duo nebs ordered.  sats are good on RA.   Type 2 DM: CBG (last 3)  Recent Labs    03/17/18 1608  GLUCAP 122*   Resume home dose of lantus and SSI.  Last A1c is  8.1    Chronic normocytic anemia:  Transfuse to keep hemoglobin  greater than 8.    Dementia:  Resume home meds.     DVT prophylaxis: Pradaxa.  Code Status: DNR Family Communication: family at bed side.  Disposition Plan: PENDING CLINICAL IMPROVEMENT.  Consults called: Urology.  Admission status: med / observation   Hosie Poisson MD [facility] Pager [phone]   If 7PM-7AM, please contact night-coverage [email or web address] Password [facility]  03/17/2018, 6:33 PM

## 2018-03-17 NOTE — H&P (View-Only) (Signed)
Urology Consult Note   Requesting Attending Physician:  Hosie Poisson, MD Service Providing Consult: Urology  Consulting Attending: Franchot Gallo, MD   Reason for Consult:  UTI, flank pain  HPI: [name] is seen in consultation for reasons noted above at the request of Hosie Poisson, MD.  This is a 78 y.o. female with past history as detailed below, recently presenting with infected/obstructing 60m left ureteral stone as well as non-obstructing right renal stone for which she is s/p bilateral ureteral stent placement and bladder biopsy (benign path) on 02/25/2018. UCx at that time grew 100K CFU E coli (resistant to ampicillin). She was discharged on 1/13 on a course of Keflex with plans for bilateral USE on 2/4 with Dr. EJunious Silk  She presents to the ED today with symptoms of generalize malaise, fever, cough, intermittent flank pain, dysuria and hematuria. She has been on nitrofurantion leading up to her scheduled USE and was recently prescribed doxycycline by her PCP.  Given concern for UTI in the setting of upcoming urologic surgery, urology was consulted for assistance with management.   Past Medical History: Past Medical History:  Diagnosis Date  . Anemia, iron deficiency 2012   Evaluated by Dr. FOneida Alar H&H of 9.3/30.8 with MSan Jonin 10/2010; 3/3 positive Hemoccult cards in 07/2011  . Anxiety    was on Prozac for a couple of weeks  . Atrial fibrillation ([place]    takes Coumadin daily  . Bruises easily    takes Coumadin  . Chronic anticoagulation 07/21/2010  . Chronic back pain    related to knee pain  . Degenerative joint disease    Knees  . D[place]   . Depression   . Diabetes mellitus    takes Metformin daily  . Gastroesophageal reflux disease    takes Omeprazole daily  . Glaucoma   . Glaucoma    uses eye drops at night  . History of blood transfusion 1969  . History of gout    was on medication but taken off of several months ago  . Hx of colonic polyps    . Hx of migraines    last one about a yr ago;takes Topamax daily  . Hyperlipidemia    takes Pravastatin daily  . Hypertension    takes Hyzaar daily and Propranolol   . Insomnia    takes Restoril prn  . Joint pain   . Joint swelling   . Memory loss    takes donepezil  . Migraine   . Migraine   . Nocturia   . Overweight(278.02)   . Pneumonia 1969   hx of  . Pulmonary embolism ([facility] May 2012   acute presentation, bilateral PE  . Skin spots-aging   . Urinary frequency     Past Surgical History:  Past Surgical History:  Procedure Laterality Date  . CATARACT EXTRACTION W/PHACO Right 04/11/2012   Procedure: CATARACT EXTRACTION PHACO AND INTRAOCULAR LENS PLACEMENT (IOC);  Surgeon: MElta GuadeloupeT. SGershon Crane MD;  Location: [facility];  Service: Ophthalmology;  Laterality: Right;  CDE=9.61  . CATARACT EXTRACTION W/PHACO Left 12/26/2012   Procedure: CATARACT EXTRACTION PHACO AND INTRAOCULAR LENS PLACEMENT (IOC);  Surgeon: MElta GuadeloupeT. SGershon Crane MD;  Location: [facility];  Service: Ophthalmology;  Laterality: Left;  CDE:7.74  . COLONOSCOPY  Jan 2002; 2012   2002: Dr. STamala Julian ext. hemorrhoids, nl colon; 2012-adenomatous polyps, gastritis on EGD  . DILATION AND CURETTAGE OF UTERUS    . ESOPHAGOGASTRODUODENOSCOPY  08/24/2011   ESOPHAGOGASTRODUODENOSCOPY; esophageal dilatation; NMattel  MD;  . Freda Munro CAPSULE STUDY  08/18/2011   Procedure: GIVENS CAPSULE STUDY;  Surgeon: Rogene Houston, MD;  Location: [facility];  Service: Endoscopy;  Laterality: N/A;  730  . KNEE ARTHROSCOPY W/ MENISCECTOMY  90's   Left  . ORIF ANKLE FRACTURE Right 90's  . TONSILLECTOMY    . TOTAL KNEE ARTHROPLASTY  10/20/2011   Procedure: TOTAL KNEE ARTHROPLASTY;  Surgeon: Ninetta Lights, MD;  Location: [place];  Service: Orthopedics;  Laterality: Left;  . UPPER GASTROINTESTINAL ENDOSCOPY    . URETHRAL DILATION      Medication: Current Facility-Administered Medications  Medication Dose Route Frequency Provider Last Rate Last Dose  .  iopamidol (ISOVUE-300) 61 % injection           . sodium chloride (PF) 0.9 % injection           . sodium chloride 0.9 % bolus 1,000 mL  1,000 mL Intravenous Once Gareth Morgan, MD       Current Outpatient Medications  Medication Sig Dispense Refill  . acetaminophen (TYLENOL) 500 MG tablet Take 1,000 mg by mouth every 6 (six) hours as needed for mild pain or fever.     Marland Kitchen acyclovir (ZOVIRAX) 400 MG tablet TAKE 1 TABLET (400 MG TOTAL) BY MOUTH 3 (THREE) TIMES DAILY. 30 tablet 5  . albuterol (VENTOLIN HFA) 108 (90 Base) MCG/ACT inhaler Inhale 2 puffs into the lungs every 6 (six) hours as needed for wheezing or shortness of breath. 18 Inhaler 3  . amLODipine (NORVASC) 10 MG tablet Take 1 tablet (10 mg total) by mouth daily. 90 tablet 1  . atorvastatin (LIPITOR) 40 MG tablet TAKE 1 TABLET BY MOUTH EVERY DAY (Patient taking differently: Take 40 mg by mouth daily. ) 90 tablet 1  . Blood Glucose Monitoring Suppl (ACCU-CHEK AVIVA PLUS) w/Device KIT For once daily testing dx E11.9 1 kit 0  . busPIRone (BUSPAR) 7.5 MG tablet TAKE 1 TABLET BY MOUTH 3 TIMES DAILY (Patient taking differently: Take 7.5 mg by mouth 3 (three) times daily. ) 270 tablet 1  . chlorpheniramine-HYDROcodone (TUSSIONEX PENNKINETIC ER) 10-8 MG/5ML SUER Take one teaspoon at bedtime as needed for excessive cough 150 mL 0  . cloNIDine (CATAPRES) 0.2 MG tablet TAKE 1 TABLET (0.2 MG TOTAL) BY MOUTH 3 (THREE) TIMES DAILY. (Patient taking differently: Take 0.6 mg by mouth at bedtime. ) 270 tablet 1  . colchicine 0.6 MG tablet Take 1 tablet (0.6 mg total) by mouth daily. May decrease to once per day as needed for diarrhea 10 tablet 0  . COMBIGAN 0.2-0.5 % ophthalmic solution Place 1 drop into both eyes 2 (two) times daily.  6  . conjugated estrogens (PREMARIN) vaginal cream Place vaginally 2 (two) times a week. Apply sparingly ( fingertip)  Twice weekly to vaginal area on Wednesdays and Fridays    . dabigatran (PRADAXA) 150 MG CAPS capsule Take  1 capsule (150 mg total) by mouth 2 (two) times daily. 180 capsule 3  . donepezil (ARICEPT) 10 MG tablet TAKE 1 TABLET BY MOUTH EVERYDAY AT BEDTIME (Patient taking differently: Take 10 mg by mouth at bedtime. ) 90 tablet 1  . doxycycline (VIBRA-TABS) 100 MG tablet Take 1 tablet (100 mg total) by mouth 2 (two) times daily. 14 tablet 0  . Fluoxetine HCl, PMDD, 10 MG TABS Take 1 tablet (10 mg total) by mouth daily.    . hydrOXYzine (ATARAX/VISTARIL) 25 MG tablet Take 1 tablet (25 mg total) by mouth 3 (three) times  daily as needed for itching.    Marland Kitchen imipramine (TOFRANIL) 50 MG tablet Take 1 tablet (50 mg total) by mouth at bedtime. 90 tablet 1  . Insulin Glargine (LANTUS SOLOSTAR) 100 UNIT/ML Solostar Pen Inject 40 Units into the skin 2 (two) times daily.    . meclizine (ANTIVERT) 25 MG tablet TAKE 1 TABLET THREE TIMES DAILY AS NEEDED FOR DIZZINESS. (Patient taking differently: Take 25 mg by mouth 3 (three) times daily as needed for nausea. ) 270 tablet 1  . montelukast (SINGULAIR) 10 MG tablet TAKE 1 TABLET BY MOUTH AT BEDTIME (Patient taking differently: Take 10 mg by mouth at bedtime. ) 90 tablet 1  . nitrofurantoin (MACRODANTIN) 100 MG capsule Take 100 mg by mouth at bedtime.    Marland Kitchen nystatin (MYCOSTATIN/NYSTOP) powder Apply topically daily as needed (for rash under breasts).    Marland Kitchen olopatadine (PATANOL) 0.1 % ophthalmic solution Place 1 drop into both eyes 2 (two) times daily.    Marland Kitchen omeprazole (PRILOSEC) 40 MG capsule Take 1 capsule (40 mg total) by mouth 2 (two) times daily.    Marland Kitchen oxyCODONE (OXY IR/ROXICODONE) 5 MG immediate release tablet Take one tablet in at midday for uncontrolled pain 30 tablet 0  . oxyCODONE-acetaminophen (PERCOCET) 10-325 MG tablet Take 1 tablet by mouth 2 (two) times daily.    Marland Kitchen senna-docusate (SENOKOT-S) 8.6-50 MG tablet Take 2 tablets by mouth at bedtime. (Patient taking differently: Take 2 tablets by mouth 2 (two) times daily. ) 60 tablet 1  . SUMAtriptan (IMITREX) 5 MG/ACT  nasal spray Place 1 spray into the nose every 2 (two) hours as needed for migraine.   2  . temazepam (RESTORIL) 30 MG capsule Take 30 mg by mouth at bedtime as needed for sleep.    Marland Kitchen topiramate (TOPAMAX) 100 MG tablet TAKE 1 TABLET BY MOUTH TWICE A DAY (Patient taking differently: Take 100 mg by mouth 2 (two) times daily. ) 180 tablet 1  . Vitamin D, Ergocalciferol, (DRISDOL) 50000 units CAPS capsule TAKE 1 CAPSULE (50,000 UNITS TOTAL) BY MOUTH ONCE A WEEK. (Patient taking differently: Take 50,000 Units by mouth every 7 (seven) days. Friday) 12 capsule 1  . glucose blood (ONE TOUCH ULTRA TEST) test strip USE TO TEST TWICE DAILY 100 each 5   Facility-Administered Medications Ordered in Other Encounters  Medication Dose Route Frequency Provider Last Rate Last Dose  . fentaNYL (SUBLIMAZE) injection 25-50 mcg  25-50 mcg Intravenous Q5 min PRN Lerry Liner, MD        Allergies: Allergies  Allergen Reactions  . Penicillins Other (See Comments)    Itching Has patient had a PCN reaction causing immediate rash, facial/tongue/throat swelling, SOB or lightheadedness with hypotension: no Has patient had a PCN reaction causing severe rash involving mucus membranes or skin necrosis: no Has patient had a PCN reaction that required hospitalization no Has patient had a PCN reaction occurring within the last 10 years: no If all of the above answers are "NO", then may proceed with Cepha  . Fentanyl Itching    Patient just started the patch on 08-08-2011 and she has noted itching, but states that it is helping.  . Sulfonamide Derivatives Hives  . Codeine Itching and Rash    tussionex  Is tolerated by patient, no phenergan dm     Social History: Social History   Tobacco Use  . Smoking status: Never Smoker  . Smokeless tobacco: Never Used  Substance Use Topics  . Alcohol use: No  . Drug use:  No    Family History Family History  Problem Relation Age of Onset  . Diabetes Mother   . Hypertension  Mother   . Arthritis Mother   . Heart failure Mother   . Migraines Mother   . Kidney failure Mother   . Leukemia Father   . Migraines Father   . Kidney failure Father   . Diabetes Sister   . Hypertension Sister   . Diabetes Brother   . Hypertension Brother   . Hypertension Brother   . Hypertension Brother   . Hypertension Brother   . Diabetes Brother   . Diabetes Brother   . Diabetes Brother   . Pulmonary embolism Sister   . Migraines Sister   . Kidney failure Brother   . Colon cancer Neg Hx   . Colon polyps Neg Hx     Review of Systems 10 systems were reviewed and are negative except as noted specifically in the HPI.  Objective   Vital signs in last 24 hours: BP (!) 156/102   Pulse (!) 105   Temp 100.1 F (37.8 C) (Rectal)   Resp (!) 32   SpO2 96%   Physical Exam General: NAD, A&O, resting, appropriate HEENT: [place]/AT, EOMI, MMM Pulmonary: Normal work of breathing Cardiovascular: HDS, adequate peripheral perfusion Abdomen: Soft, NTTP, nondistended. GU: mild left CVAT Extremities: warm and well perfused Neuro: Appropriate, no focal neurological deficits  Most Recent Labs: Lab Results  Component Value Date   WBC 9.7 03/17/2018   HGB 8.9 (L) 03/17/2018   HCT 30.6 (L) 03/17/2018   PLT 404 (H) 03/17/2018    Lab Results  Component Value Date   NA 133 (L) 03/17/2018   K 3.9 03/17/2018   CL 101 03/17/2018   CO2 20 (L) 03/17/2018   BUN 21 03/17/2018   CREATININE 1.29 (H) 03/17/2018   CALCIUM 8.8 (L) 03/17/2018   MG 1.8 06/21/2017    Lab Results  Component Value Date   INR 1.59 12/18/2017   APTT 32 06/26/2012     IMAGING: Dg Chest 2 View  Result Date: 03/17/2018 CLINICAL DATA:  Cough, fever x couple days, recently had kidney stents placed cough, fever EXAM: CHEST - 2 VIEW COMPARISON:  Chest radiograph 02/25/2018, CT 01/24/2018 FINDINGS: Stable cardiac silhouette. There is increased RIGHT perihilar / suprahilar density compared to recent radiograph  with a sharp arc like border. Favor this representing combination of the SVC and ectatic ascending aorta. Central venous congestion noted. No pulmonary edema. No pleural fluid. IMPRESSION: 1. Extra shadow along the RIGHT mediastinum is favored benign vascular shadow. 2. Central venous congestion. Electronically Signed   By: Suzy Bouchard M.D.   On: 03/17/2018 15:37    ------  Assessment:  78 y.o. female with history of nephrolithiasis s/p b/l stent placement 1/11 who presents with concern for UTI.  Patient with most recent temp of 101.47F and mild tachycardia. Non-toxic on exam. No leukocytosis. Normal lactate. UA with negative nitriate, large LE, >50 RBCs, >50 WBCs, rare bacteria. B/UCx pending. She received 1g cefepime in ED.   Recommendations: - No urologic intervention indicated at this time as patient has b/l ureteral stent in appropriate position without evidence of hydronephrosis on CT. - Agree with admission to medicine for IV antibiotics pending culture results. Would base empiric coverage on recent UCx sensitivities. - Mild and intermittent hematuria is expected with ureteral stents in place - Consider Foley catheter placement if patient does not continue to improve with empiric antibiotic coverage.  Thank you for this consult. Please contact the urology consult pager with any further questions/concerns.  I have seen the patient, spoken with her and reviewed her old history.  I agree with the above assessment and plan.

## 2018-03-17 NOTE — Consult Note (Addendum)
Urology Consult Note   Requesting Attending Physician:  Akula, Vijaya, MD Service Providing Consult: Urology  Consulting Attending: Leanah Kolander, MD   Reason for Consult:  UTI, flank pain  HPI: [name] is seen in consultation for reasons noted above at the request of Akula, Vijaya, MD.  This is a 78 y.o. female with past history as detailed below, recently presenting with infected/obstructing 3mm left ureteral stone as well as non-obstructing right renal stone for which she is s/p bilateral ureteral stent placement and bladder biopsy (benign path) on 02/25/2018. UCx at that time grew 100K CFU E coli (resistant to ampicillin). She was discharged on 1/13 on a course of Keflex with plans for bilateral USE on 2/4 with Dr. Eskridge.  She presents to the ED today with symptoms of generalize malaise, fever, cough, intermittent flank pain, dysuria and hematuria. She has been on nitrofurantion leading up to her scheduled USE and was recently prescribed doxycycline by her PCP.  Given concern for UTI in the setting of upcoming urologic surgery, urology was consulted for assistance with management.   Past Medical History: Past Medical History:  Diagnosis Date  . Anemia, iron deficiency 2012   Evaluated by Dr. Fields; H&H of 9.3/30.8 with MCV-79 in 10/2010; 3/3 positive Hemoccult cards in 07/2011  . Anxiety    was on Prozac for a couple of weeks  . Atrial fibrillation (HCC)    takes Coumadin daily  . Bruises easily    takes Coumadin  . Chronic anticoagulation 07/21/2010  . Chronic back pain    related to knee pain  . Degenerative joint disease    Knees  . Dementia (HCC)   . Depression   . Diabetes mellitus    takes Metformin daily  . Gastroesophageal reflux disease    takes Omeprazole daily  . Glaucoma   . Glaucoma    uses eye drops at night  . History of blood transfusion 1969  . History of gout    was on medication but taken off of several months ago  . Hx of colonic polyps    . Hx of migraines    last one about a yr ago;takes Topamax daily  . Hyperlipidemia    takes Pravastatin daily  . Hypertension    takes Hyzaar daily and Propranolol   . Insomnia    takes Restoril prn  . Joint pain   . Joint swelling   . Memory loss    takes donepezil  . Migraine   . Migraine   . Nocturia   . Overweight(278.02)   . Pneumonia 1969   hx of  . Pulmonary embolism (HCC) May 2012   acute presentation, bilateral PE  . Skin spots-aging   . Urinary frequency     Past Surgical History:  Past Surgical History:  Procedure Laterality Date  . CATARACT EXTRACTION W/PHACO Right 04/11/2012   Procedure: CATARACT EXTRACTION PHACO AND INTRAOCULAR LENS PLACEMENT (IOC);  Surgeon: Mark T. Shapiro, MD;  Location: [facility];  Service: Ophthalmology;  Laterality: Right;  CDE=9.61  . CATARACT EXTRACTION W/PHACO Left 12/26/2012   Procedure: CATARACT EXTRACTION PHACO AND INTRAOCULAR LENS PLACEMENT (IOC);  Surgeon: Mark T. Shapiro, MD;  Location: [facility];  Service: Ophthalmology;  Laterality: Left;  CDE:7.74  . COLONOSCOPY  Jan 2002; 2012   2002: Dr. Smith, ext. hemorrhoids, nl colon; 2012-adenomatous polyps, gastritis on EGD  . DILATION AND CURETTAGE OF UTERUS    . ESOPHAGOGASTRODUODENOSCOPY  08/24/2011   ESOPHAGOGASTRODUODENOSCOPY; esophageal dilatation; Najeeb U Rehman,   MD;  . GIVENS CAPSULE STUDY  08/18/2011   Procedure: GIVENS CAPSULE STUDY;  Surgeon: Najeeb U Rehman, MD;  Location: [facility];  Service: Endoscopy;  Laterality: N/A;  730  . KNEE ARTHROSCOPY W/ MENISCECTOMY  90's   Left  . ORIF ANKLE FRACTURE Right 90's  . TONSILLECTOMY    . TOTAL KNEE ARTHROPLASTY  10/20/2011   Procedure: TOTAL KNEE ARTHROPLASTY;  Surgeon: Daniel F Murphy, MD;  Location: [facility];  Service: Orthopedics;  Laterality: Left;  . UPPER GASTROINTESTINAL ENDOSCOPY    . URETHRAL DILATION      Medication: Current Facility-Administered Medications  Medication Dose Route Frequency Provider Last Rate Last Dose  .  iopamidol (ISOVUE-300) 61 % injection           . sodium chloride (PF) 0.9 % injection           . sodium chloride 0.9 % bolus 1,000 mL  1,000 mL Intravenous Once Schlossman, Erin, MD       Current Outpatient Medications  Medication Sig Dispense Refill  . acetaminophen (TYLENOL) 500 MG tablet Take 1,000 mg by mouth every 6 (six) hours as needed for mild pain or fever.     . acyclovir (ZOVIRAX) 400 MG tablet TAKE 1 TABLET (400 MG TOTAL) BY MOUTH 3 (THREE) TIMES DAILY. 30 tablet 5  . albuterol (VENTOLIN HFA) 108 (90 Base) MCG/ACT inhaler Inhale 2 puffs into the lungs every 6 (six) hours as needed for wheezing or shortness of breath. 18 Inhaler 3  . amLODipine (NORVASC) 10 MG tablet Take 1 tablet (10 mg total) by mouth daily. 90 tablet 1  . atorvastatin (LIPITOR) 40 MG tablet TAKE 1 TABLET BY MOUTH EVERY DAY (Patient taking differently: Take 40 mg by mouth daily. ) 90 tablet 1  . Blood Glucose Monitoring Suppl (ACCU-CHEK AVIVA PLUS) w/Device KIT For once daily testing dx E11.9 1 kit 0  . busPIRone (BUSPAR) 7.5 MG tablet TAKE 1 TABLET BY MOUTH 3 TIMES DAILY (Patient taking differently: Take 7.5 mg by mouth 3 (three) times daily. ) 270 tablet 1  . chlorpheniramine-HYDROcodone (TUSSIONEX PENNKINETIC ER) 10-8 MG/5ML SUER Take one teaspoon at bedtime as needed for excessive cough 150 mL 0  . cloNIDine (CATAPRES) 0.2 MG tablet TAKE 1 TABLET (0.2 MG TOTAL) BY MOUTH 3 (THREE) TIMES DAILY. (Patient taking differently: Take 0.6 mg by mouth at bedtime. ) 270 tablet 1  . colchicine 0.6 MG tablet Take 1 tablet (0.6 mg total) by mouth daily. May decrease to once per day as needed for diarrhea 10 tablet 0  . COMBIGAN 0.2-0.5 % ophthalmic solution Place 1 drop into both eyes 2 (two) times daily.  6  . conjugated estrogens (PREMARIN) vaginal cream Place vaginally 2 (two) times a week. Apply sparingly ( fingertip)  Twice weekly to vaginal area on Wednesdays and Fridays    . dabigatran (PRADAXA) 150 MG CAPS capsule Take  1 capsule (150 mg total) by mouth 2 (two) times daily. 180 capsule 3  . donepezil (ARICEPT) 10 MG tablet TAKE 1 TABLET BY MOUTH EVERYDAY AT BEDTIME (Patient taking differently: Take 10 mg by mouth at bedtime. ) 90 tablet 1  . doxycycline (VIBRA-TABS) 100 MG tablet Take 1 tablet (100 mg total) by mouth 2 (two) times daily. 14 tablet 0  . Fluoxetine HCl, PMDD, 10 MG TABS Take 1 tablet (10 mg total) by mouth daily.    . hydrOXYzine (ATARAX/VISTARIL) 25 MG tablet Take 1 tablet (25 mg total) by mouth 3 (three) times   daily as needed for itching.    . imipramine (TOFRANIL) 50 MG tablet Take 1 tablet (50 mg total) by mouth at bedtime. 90 tablet 1  . Insulin Glargine (LANTUS SOLOSTAR) 100 UNIT/ML Solostar Pen Inject 40 Units into the skin 2 (two) times daily.    . meclizine (ANTIVERT) 25 MG tablet TAKE 1 TABLET THREE TIMES DAILY AS NEEDED FOR DIZZINESS. (Patient taking differently: Take 25 mg by mouth 3 (three) times daily as needed for nausea. ) 270 tablet 1  . montelukast (SINGULAIR) 10 MG tablet TAKE 1 TABLET BY MOUTH AT BEDTIME (Patient taking differently: Take 10 mg by mouth at bedtime. ) 90 tablet 1  . nitrofurantoin (MACRODANTIN) 100 MG capsule Take 100 mg by mouth at bedtime.    . nystatin (MYCOSTATIN/NYSTOP) powder Apply topically daily as needed (for rash under breasts).    . olopatadine (PATANOL) 0.1 % ophthalmic solution Place 1 drop into both eyes 2 (two) times daily.    . omeprazole (PRILOSEC) 40 MG capsule Take 1 capsule (40 mg total) by mouth 2 (two) times daily.    . oxyCODONE (OXY IR/ROXICODONE) 5 MG immediate release tablet Take one tablet in at midday for uncontrolled pain 30 tablet 0  . oxyCODONE-acetaminophen (PERCOCET) 10-325 MG tablet Take 1 tablet by mouth 2 (two) times daily.    . senna-docusate (SENOKOT-S) 8.6-50 MG tablet Take 2 tablets by mouth at bedtime. (Patient taking differently: Take 2 tablets by mouth 2 (two) times daily. ) 60 tablet 1  . SUMAtriptan (IMITREX) 5 MG/ACT  nasal spray Place 1 spray into the nose every 2 (two) hours as needed for migraine.   2  . temazepam (RESTORIL) 30 MG capsule Take 30 mg by mouth at bedtime as needed for sleep.    . topiramate (TOPAMAX) 100 MG tablet TAKE 1 TABLET BY MOUTH TWICE A DAY (Patient taking differently: Take 100 mg by mouth 2 (two) times daily. ) 180 tablet 1  . Vitamin D, Ergocalciferol, (DRISDOL) 50000 units CAPS capsule TAKE 1 CAPSULE (50,000 UNITS TOTAL) BY MOUTH ONCE A WEEK. (Patient taking differently: Take 50,000 Units by mouth every 7 (seven) days. Friday) 12 capsule 1  . glucose blood (ONE TOUCH ULTRA TEST) test strip USE TO TEST TWICE DAILY 100 each 5   Facility-Administered Medications Ordered in Other Encounters  Medication Dose Route Frequency Provider Last Rate Last Dose  . fentaNYL (SUBLIMAZE) injection 25-50 mcg  25-50 mcg Intravenous Q5 min PRN Gonzalez, Luis, MD        Allergies: Allergies  Allergen Reactions  . Penicillins Other (See Comments)    Itching Has patient had a PCN reaction causing immediate rash, facial/tongue/throat swelling, SOB or lightheadedness with hypotension: no Has patient had a PCN reaction causing severe rash involving mucus membranes or skin necrosis: no Has patient had a PCN reaction that required hospitalization no Has patient had a PCN reaction occurring within the last 10 years: no If all of the above answers are "NO", then may proceed with Cepha  . Fentanyl Itching    Patient just started the patch on 08-08-2011 and she has noted itching, but states that it is helping.  . Sulfonamide Derivatives Hives  . Codeine Itching and Rash    tussionex  Is tolerated by patient, no phenergan dm     Social History: Social History   Tobacco Use  . Smoking status: Never Smoker  . Smokeless tobacco: Never Used  Substance Use Topics  . Alcohol use: No  . Drug use:   No    Family History Family History  Problem Relation Age of Onset  . Diabetes Mother   . Hypertension  Mother   . Arthritis Mother   . Heart failure Mother   . Migraines Mother   . Kidney failure Mother   . Leukemia Father   . Migraines Father   . Kidney failure Father   . Diabetes Sister   . Hypertension Sister   . Diabetes Brother   . Hypertension Brother   . Hypertension Brother   . Hypertension Brother   . Hypertension Brother   . Diabetes Brother   . Diabetes Brother   . Diabetes Brother   . Pulmonary embolism Sister   . Migraines Sister   . Kidney failure Brother   . Colon cancer Neg Hx   . Colon polyps Neg Hx     Review of Systems 10 systems were reviewed and are negative except as noted specifically in the HPI.  Objective   Vital signs in last 24 hours: BP (!) 156/102   Pulse (!) 105   Temp 100.1 F (37.8 C) (Rectal)   Resp (!) 32   SpO2 96%   Physical Exam General: NAD, A&O, resting, appropriate HEENT: [place]/AT, EOMI, MMM Pulmonary: Normal work of breathing Cardiovascular: HDS, adequate peripheral perfusion Abdomen: Soft, NTTP, nondistended. GU: mild left CVAT Extremities: warm and well perfused Neuro: Appropriate, no focal neurological deficits  Most Recent Labs: Lab Results  Component Value Date   WBC 9.7 03/17/2018   HGB 8.9 (L) 03/17/2018   HCT 30.6 (L) 03/17/2018   PLT 404 (H) 03/17/2018    Lab Results  Component Value Date   NA 133 (L) 03/17/2018   K 3.9 03/17/2018   CL 101 03/17/2018   CO2 20 (L) 03/17/2018   BUN 21 03/17/2018   CREATININE 1.29 (H) 03/17/2018   CALCIUM 8.8 (L) 03/17/2018   MG 1.8 06/21/2017    Lab Results  Component Value Date   INR 1.59 12/18/2017   APTT 32 06/26/2012     IMAGING: Dg Chest 2 View  Result Date: 03/17/2018 CLINICAL DATA:  Cough, fever x couple days, recently had kidney stents placed cough, fever EXAM: CHEST - 2 VIEW COMPARISON:  Chest radiograph 02/25/2018, CT 01/24/2018 FINDINGS: Stable cardiac silhouette. There is increased RIGHT perihilar / suprahilar density compared to recent radiograph  with a sharp arc like border. Favor this representing combination of the SVC and ectatic ascending aorta. Central venous congestion noted. No pulmonary edema. No pleural fluid. IMPRESSION: 1. Extra shadow along the RIGHT mediastinum is favored benign vascular shadow. 2. Central venous congestion. Electronically Signed   By: Stewart  Edmunds M.D.   On: 03/17/2018 15:37    ------  Assessment:  78 y.o. female with history of nephrolithiasis s/p b/l stent placement 1/11 who presents with concern for UTI.  Patient with most recent temp of 101.1F and mild tachycardia. Non-toxic on exam. No leukocytosis. Normal lactate. UA with negative nitriate, large LE, >50 RBCs, >50 WBCs, rare bacteria. B/UCx pending. She received 1g cefepime in ED.   Recommendations: - No urologic intervention indicated at this time as patient has b/l ureteral stent in appropriate position without evidence of hydronephrosis on CT. - Agree with admission to medicine for IV antibiotics pending culture results. Would base empiric coverage on recent UCx sensitivities. - Mild and intermittent hematuria is expected with ureteral stents in place - Consider Foley catheter placement if patient does not continue to improve with empiric antibiotic coverage.     Thank you for this consult. Please contact the urology consult pager with any further questions/concerns.  I have seen the patient, spoken with her and reviewed her old history.  I agree with the above assessment and plan. 

## 2018-03-17 NOTE — ED Notes (Signed)
Bed: WA24 Expected date:  Expected time:  Means of arrival:  Comments: Hold for triage 1 

## 2018-03-17 NOTE — ED Triage Notes (Signed)
Per family, states patient had stents placed in kidney's about 3 weeks ago-states has been having UTI's and kidney stones-recently treated for bronchitis-saw PCP 2 days ago and had UA done and CXR-patient has dysuria and left flank pian

## 2018-03-18 DIAGNOSIS — Z794 Long term (current) use of insulin: Secondary | ICD-10-CM | POA: Diagnosis not present

## 2018-03-18 DIAGNOSIS — K219 Gastro-esophageal reflux disease without esophagitis: Secondary | ICD-10-CM | POA: Diagnosis not present

## 2018-03-18 DIAGNOSIS — Z86711 Personal history of pulmonary embolism: Secondary | ICD-10-CM | POA: Diagnosis not present

## 2018-03-18 DIAGNOSIS — R319 Hematuria, unspecified: Secondary | ICD-10-CM | POA: Diagnosis not present

## 2018-03-18 DIAGNOSIS — R1084 Generalized abdominal pain: Secondary | ICD-10-CM | POA: Diagnosis not present

## 2018-03-18 DIAGNOSIS — N390 Urinary tract infection, site not specified: Secondary | ICD-10-CM | POA: Diagnosis not present

## 2018-03-18 DIAGNOSIS — R509 Fever, unspecified: Secondary | ICD-10-CM | POA: Diagnosis not present

## 2018-03-18 DIAGNOSIS — R000 Tachycardia, unspecified: Secondary | ICD-10-CM | POA: Diagnosis not present

## 2018-03-18 DIAGNOSIS — R531 Weakness: Secondary | ICD-10-CM | POA: Diagnosis not present

## 2018-03-18 DIAGNOSIS — R300 Dysuria: Secondary | ICD-10-CM | POA: Diagnosis not present

## 2018-03-18 DIAGNOSIS — I10 Essential (primary) hypertension: Secondary | ICD-10-CM | POA: Diagnosis not present

## 2018-03-18 DIAGNOSIS — E1165 Type 2 diabetes mellitus with hyperglycemia: Secondary | ICD-10-CM | POA: Diagnosis not present

## 2018-03-18 LAB — BASIC METABOLIC PANEL
Anion gap: 8 (ref 5–15)
BUN: 19 mg/dL (ref 8–23)
CO2: 20 mmol/L — AB (ref 22–32)
Calcium: 8.3 mg/dL — ABNORMAL LOW (ref 8.9–10.3)
Chloride: 105 mmol/L (ref 98–111)
Creatinine, Ser: 1.11 mg/dL — ABNORMAL HIGH (ref 0.44–1.00)
GFR calc Af Amer: 55 mL/min — ABNORMAL LOW (ref >60)
GFR calc non Af Amer: 48 mL/min — ABNORMAL LOW (ref >60)
Glucose, Bld: 186 mg/dL — ABNORMAL HIGH (ref 70–99)
Potassium: 3.6 mmol/L (ref 3.5–5.1)
Sodium: 133 mmol/L — ABNORMAL LOW (ref 135–145)

## 2018-03-18 LAB — GLUCOSE, CAPILLARY
Glucose-Capillary: 145 mg/dL — ABNORMAL HIGH (ref 70–99)
Glucose-Capillary: 155 mg/dL — ABNORMAL HIGH (ref 70–99)
Glucose-Capillary: 126 mg/dL — ABNORMAL HIGH (ref 70–99)
Glucose-Capillary: 204 mg/dL — ABNORMAL HIGH (ref 70–99)

## 2018-03-18 LAB — URINE CULTURE: Culture: NO GROWTH

## 2018-03-18 MED ORDER — SODIUM CHLORIDE 0.9 % IV SOLN
1.0000 g | Freq: Two times a day (BID) | INTRAVENOUS | Status: DC
  Administered 2018-03-18 – 2018-03-19 (×3): 1 g via INTRAVENOUS
  Filled 2018-03-18 (×3): qty 1

## 2018-03-18 MED ORDER — IPRATROPIUM-ALBUTEROL 0.5-2.5 (3) MG/3ML IN SOLN
3.0000 mL | Freq: Two times a day (BID) | RESPIRATORY_TRACT | Status: DC
  Administered 2018-03-18 – 2018-03-19 (×2): 3 mL via RESPIRATORY_TRACT
  Filled 2018-03-18 (×2): qty 3

## 2018-03-18 MED ORDER — INSULIN ASPART 100 UNIT/ML ~~LOC~~ SOLN
0.0000 [IU] | Freq: Three times a day (TID) | SUBCUTANEOUS | Status: DC
  Administered 2018-03-18: 2 [IU] via SUBCUTANEOUS
  Administered 2018-03-18: 3 [IU] via SUBCUTANEOUS
  Administered 2018-03-19 (×2): 2 [IU] via SUBCUTANEOUS
  Administered 2018-03-19 – 2018-03-20 (×2): 3 [IU] via SUBCUTANEOUS

## 2018-03-18 MED ORDER — DOCUSATE SODIUM 100 MG PO CAPS
100.0000 mg | ORAL_CAPSULE | Freq: Every day | ORAL | Status: DC | PRN
  Administered 2018-03-18: 100 mg via ORAL
  Filled 2018-03-18: qty 1

## 2018-03-18 MED ORDER — INSULIN ASPART 100 UNIT/ML ~~LOC~~ SOLN
0.0000 [IU] | Freq: Every day | SUBCUTANEOUS | Status: DC
  Administered 2018-03-18: 2 [IU] via SUBCUTANEOUS

## 2018-03-18 MED ORDER — MORPHINE SULFATE (PF) 2 MG/ML IV SOLN
2.0000 mg | Freq: Once | INTRAVENOUS | Status: AC
  Administered 2018-03-18: 2 mg via INTRAVENOUS

## 2018-03-18 MED ORDER — IPRATROPIUM-ALBUTEROL 0.5-2.5 (3) MG/3ML IN SOLN
3.0000 mL | Freq: Three times a day (TID) | RESPIRATORY_TRACT | Status: DC
  Administered 2018-03-18: 3 mL via RESPIRATORY_TRACT
  Filled 2018-03-18: qty 3

## 2018-03-18 NOTE — Progress Notes (Signed)
Subjective: Patient without significant complaint today.  She has been afebrile.  Objective: Vital signs in last 24 hours: Temp:  [97.8 F (36.6 C)-100.1 F (37.8 C)] 97.8 F (36.6 C) (02/01 0535) Pulse Rate:  [82-121] 82 (02/01 0535) Resp:  [15-32] 20 (02/01 0535) BP: (133-177)/(69-107) 152/75 (02/01 0535) SpO2:  [89 %-100 %] 95 % (02/01 0808) Weight:  [105.5 kg] 105.5 kg (01/31 2234)  Intake/Output from previous day: 01/31 0701 - 02/01 0700 In: -  Out: 850 [Urine:850] Intake/Output this shift: Total I/O In: 240 [P.O.:240] Out: -   Physical Exam:  Constitutional: Vital signs reviewed. WD WN in NAD   Eyes: PERRL, No scleral icterus.   Cardiovascular: RRR Pulmonary/Chest: Normal effort    Lab Results: Recent Labs    03/15/18 1506 03/17/18 1222  HGB 9.1* 8.9*  HCT 29.2* 30.6*   BMET Recent Labs    03/15/18 1506 03/17/18 1222  NA 138 133*  K 4.1 3.9  CL 105 101  CO2 18* 20*  GLUCOSE 176* 174*  BUN 26* 21  CREATININE 1.19* 1.29*  CALCIUM 9.5 8.8*   No results for input(s): LABPT, INR in the last 72 hours. No results for input(s): LABURIN in the last 72 hours. Results for orders placed or performed during the hospital encounter of 03/17/18  Blood culture (routine x 2)     Status: None (Preliminary result)   Collection Time: 03/17/18  1:27 PM  Result Value Ref Range Status   Specimen Description BLOOD LEFT ANTECUBITAL  Final   Special Requests   Final    BOTTLES DRAWN AEROBIC AND ANAEROBIC Blood Culture results may not be optimal due to an excessive volume of blood received in culture bottles Performed at [facility], [place] [address], [place], [place] [address]    Culture NO GROWTH < 24 HOURS  Final   Report Status PENDING  Incomplete  Blood culture (routine x 2)     Status: None (Preliminary result)   Collection Time: 03/17/18  1:32 PM  Result Value Ref Range Status   Specimen Description BLOOD RIGHT ANTECUBITAL  Final   Special  Requests   Final    BOTTLES DRAWN AEROBIC AND ANAEROBIC Blood Culture results may not be optimal due to an excessive volume of blood received in culture bottles Performed at [facility], [place] [address], [place],  [address]    Culture NO GROWTH < 24 HOURS  Final   Report Status PENDING  Incomplete    Studies/Results: Dg Chest 2 View  Result Date: 03/17/2018 CLINICAL DATA:  Cough, fever x couple days, recently had kidney stents placed cough, fever EXAM: CHEST - 2 VIEW COMPARISON:  Chest radiograph 02/25/2018, CT 01/24/2018 FINDINGS: Stable cardiac silhouette. There is increased RIGHT perihilar / suprahilar density compared to recent radiograph with a sharp arc like border. Favor this representing combination of the SVC and ectatic ascending aorta. Central venous congestion noted. No pulmonary edema. No pleural fluid. IMPRESSION: 1. Extra shadow along the RIGHT mediastinum is favored benign vascular shadow. 2. Central venous congestion. Electronically Signed   By: Suzy Bouchard M.D.   On: 03/17/2018 15:37   Ct Abdomen Pelvis W Contrast  Result Date: 03/17/2018 CLINICAL DATA:  Left flank pain. Dysuria. Bilateral ureteral stents. EXAM: CT ABDOMEN AND PELVIS WITH CONTRAST TECHNIQUE: Multidetector CT imaging of the abdomen and pelvis was performed using the standard protocol following bolus administration of intravenous contrast. CONTRAST:  110mL OMNIPAQUE IOHEXOL 300 MG/ML  SOLN COMPARISON:  CT  scan dated 02/25/2018 FINDINGS: Lower chest: No acute abnormality. Hepatobiliary: No focal liver abnormality is seen. No gallstones, gallbladder wall thickening, or biliary dilatation. Pancreas: Diffuse atrophy of the pancreas. No mass lesions. No ductal dilatation. Spleen: Normal in size without focal abnormality. Adrenals/Urinary Tract: Adrenal glands are normal. Bilateral ureteral stents are in place. Stable bilateral renal cysts. No hydronephrosis. Bladder appears normal except for  the presence of the stents. Stomach/Bowel: Stomach is within normal limits. Appendix appears normal. No evidence of bowel wall thickening, distention, or inflammatory changes. Vascular/Lymphatic: No significant vascular findings are present. No enlarged abdominal or pelvic lymph nodes. Reproductive: Uterus and bilateral adnexa are unremarkable. Other: No abdominal wall hernia or abnormality. No abdominopelvic ascites. 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 Broad-based disc protrusion. Gas is herniated into the right neural foramen. Severe spinal stenosis at that level, unchanged. IMPRESSION: No acute abnormalities. Bilateral ureteral stents in place with no residual hydronephrosis. 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 Electronically Signed   By: Lorriane Shire M.D.   On: 03/17/2018 17:07    Assessment/Plan:   Admitted for possible UTI with hematuria (expected with stents/Pradaxa).  She seems to be doing fine.  Blood cultures negative so far, urine culture pending.    For now, continue antibiotics.  She has ureteroscopy scheduled for this coming Tuesday.  If culture is negative, it should be okay to proceed.   LOS: 0 days   Lillette Boxer Prabhleen Montemayor 03/18/2018, 10:06 AM

## 2018-03-18 NOTE — Progress Notes (Signed)
Pharmacy Antibiotic Note  [name] is a 78 y.o. female with hx infected left ureteral stone (s/p bilateral ureteral stent and bladder biopy on 02/26/08) with plan for USE on 2/4 and treated with keflex at discharge on 1/13, presented to the ED on 03/17/2018 with c/o malaie, cough, frank pain, dysuria and hematuria.  Of note, she was placed on doxycycline PTA.  Cefepime started on admission for broad empiric coverage for UTI.  Today, 03/18/2018: - day #2 abx - Tmax 100.1, wbc wnl - scr 1.29 on 1/31 (crcl~44) - ucx has been negative thus far   Plan: - adjust cefepime dose to 1gm IV q12h - scr on 2/2 - f/u cx  _______________________________________  Height: 5\' 6"  (167.6 cm) Weight: 232 lb 9.4 oz (105.5 kg) IBW/kg (Calculated) : 59.3  Temp (24hrs), Avg:99 F (37.2 C), Min:97.8 F (36.6 C), Max:100.1 F (37.8 C)  Recent Labs  Lab 03/15/18 1506 03/17/18 1222 03/17/18 1327 03/17/18 2238  WBC 14.1* 9.7  --   --   CREATININE 1.19* 1.29*  --   --   LATICACIDVEN  --   --  1.1 1.0    Estimated Creatinine Clearance: 44.1 mL/min (A) (by C-G formula based on SCr of 1.29 mg/dL (H)).     Antimicrobials this admission: 1/31 cefepime >>   Microbiology results: 12/18/17 ucx: >100K proteus (R= nitro) 1/11 ucx: >100K Ecoli (R= amp, I= unasyn)  1/31 BCx x2:  1/31 UCx:   Thank you for allowing pharmacy to be a part of this patient's care.  Lynelle Doctor 03/18/2018 10:07 AM

## 2018-03-18 NOTE — Progress Notes (Signed)
PROGRESS NOTE    [name]  EXB:[id number] DOB: 10-Dec-1940 DOA: 03/17/2018 PCP: [name], MD    Brief Narrative:[name] is a 78 y.o. female with medical history significant of iron deficiency anemia, atrial fibrillation on pradaxa, dementia, DM, pulmonary embolism in the past, migraines, h/o ureteral stones recently admitted for hydronephrosis and underwent Cystoscopy, bilateral retrograde pyelogram, bilateral ureteral stent placement; Bladder biopsy fulguration 0.5 to 2 cm by Dr Junious Silk and discharged on keflex presents with urinary symptoms, fevers and flank pain since 2 to 3 days and associated sob, wheezing and coughing.   Assessment & Plan:   Active Problems:   UTI (urinary tract infection)  Complicated UTI with H/o recent hydronephrosis:  S/p   Cystoscopy, bilateral retrograde pyelogram, bilateral ureteral stent placement; Bladder biopsy fulguration 0.5 to 2 cm, last admission and her CT today does not show any residual hydronephrosis Continue with IV cefepime and follow up cultures.  Appreciate Urology recommendations.     Paroxysmal atrial fibrillation: Rate controlled.     Pulmonary embolism: Resume pradaxa.    Hypertension:  Better controlled today.    Sob, cough and wheezing Possibly acute bronchitis.  Continue with duoneb.  CXR does not show pneumonia.    Anemia of chronic disease: Transfuse to keep hemoglobin greater than 7.     AKI:  Improved.   DVT prophylaxis: Pradaxa.  Code Status:  Full code.  Family Communication: family at bedside.  Disposition Plan: pending clinical improvement.    Consultants:  Urology.   Procedures: NOne.    Antimicrobials: cefepime since admission.    Subjective: Continues to have supra pubic pain,but better than yesterday.   Objective: Vitals:   03/18/18 0235 03/18/18 0535 03/18/18 0808 03/18/18 1343  BP:  (!) 152/75  135/70  Pulse:  82  83  Resp:  20  14  Temp:  97.8 F (36.6 C)   97.7 F (36.5 C)  TempSrc:  Oral  Oral  SpO2: 97% 97% 95% 94%  Weight:      Height:        Intake/Output Summary (Last 24 hours) at 03/18/2018 1819 Last data filed at 03/18/2018 1800 Gross per 24 hour  Intake 480 ml  Output 1150 ml  Net -670 ml   Filed Weights   03/17/18 2234  Weight: 105.5 kg    Examination:  General exam: mild distress from supra pubic tenderness.  Respiratory system: Clear to auscultation. Respiratory effort normal. Cardiovascular system: S1 & S2 heard, RRR. No JVD, murmurs, Gastrointestinal system: Abdomen is  Soft tender generalized, bowel sounds good.  Central nervous system: Alert and oriented. No focal neurological deficits. Extremities: Symmetric 5 x 5 power. Skin: No rashes, lesions or ulcers Psychiatry: Mood & affect appropriate.     Data Reviewed: I have personally reviewed following labs and imaging studies  CBC: Recent Labs  Lab 03/15/18 1506 03/17/18 1222  WBC 14.1* 9.7  NEUTROABS  --  6.1  HGB 9.1* 8.9*  HCT 29.2* 30.6*  MCV 76.8* 82.5  PLT 564* 102*   Basic Metabolic Panel: Recent Labs  Lab 03/15/18 1506 03/17/18 1222 03/18/18 1134  NA 138 133* 133*  K 4.1 3.9 3.6  CL 105 101 105  CO2 18* 20* 20*  GLUCOSE 176* 174* 186*  BUN 26* 21 19  CREATININE 1.19* 1.29* 1.11*  CALCIUM 9.5 8.8* 8.3*   GFR: Estimated Creatinine Clearance: 51.3 mL/min (A) (by C-G formula based on SCr of 1.11 mg/dL (H)). Liver Function Tests: Recent  Labs  Lab 03/17/18 1326  AST 16  ALT 12  ALKPHOS 79  BILITOT 0.3  PROT 7.2  ALBUMIN 3.4*   No results for input(s): LIPASE, AMYLASE in the last 168 hours. No results for input(s): AMMONIA in the last 168 hours. Coagulation Profile: No results for input(s): INR, PROTIME in the last 168 hours. Cardiac Enzymes: No results for input(s): CKTOTAL, CKMB, CKMBINDEX, TROPONINI in the last 168 hours. BNP (last 3 results) No results for input(s): PROBNP in the last 8760 hours. HbA1C: No results for  input(s): HGBA1C in the last 72 hours. CBG: Recent Labs  Lab 03/17/18 1608 03/17/18 2316 03/18/18 0812 03/18/18 1205 03/18/18 1643  GLUCAP 122* 201* 145* 155* 126*   Lipid Profile: No results for input(s): CHOL, HDL, LDLCALC, TRIG, CHOLHDL, LDLDIRECT in the last 72 hours. Thyroid Function Tests: No results for input(s): TSH, T4TOTAL, FREET4, T3FREE, THYROIDAB in the last 72 hours. Anemia Panel: No results for input(s): VITAMINB12, FOLATE, FERRITIN, TIBC, IRON, RETICCTPCT in the last 72 hours. Sepsis Labs: Recent Labs  Lab 03/17/18 1327 03/17/18 2238  LATICACIDVEN 1.1 1.0    Recent Results (from the past 240 hour(s))  Blood culture (routine x 2)     Status: None (Preliminary result)   Collection Time: 03/17/18  1:27 PM  Result Value Ref Range Status   Specimen Description BLOOD LEFT ANTECUBITAL  Final   Special Requests   Final    BOTTLES DRAWN AEROBIC AND ANAEROBIC Blood Culture results may not be optimal due to an excessive volume of blood received in culture bottles Performed at [facility], [place] [address] [place], [place] [address]    Culture NO GROWTH < 24 HOURS  Final   Report Status PENDING  Incomplete  Blood culture (routine x 2)     Status: None (Preliminary result)   Collection Time: 03/17/18  1:32 PM  Result Value Ref Range Status   Specimen Description BLOOD RIGHT ANTECUBITAL  Final   Special Requests   Final    BOTTLES DRAWN AEROBIC AND ANAEROBIC Blood Culture results may not be optimal due to an excessive volume of blood received in culture bottles Performed at [facility], [place] [address] [place], [place] [address]    Culture NO GROWTH < 24 HOURS  Final   Report Status PENDING  Incomplete  Urine culture     Status: None   Collection Time: 03/17/18  2:47 PM  Result Value Ref Range Status   Specimen Description   Final    URINE, CLEAN CATCH Performed at [facility], [place] [address],  [place], [place] [address]    Special Requests   Final    NONE Performed at [facility], [place] [address] [place], [place] [address]    Culture   Final    NO GROWTH Performed at [facility], [place] [address], [place], [place] [address]    Report Status 03/18/2018 FINAL  Final         Radiology Studies: Dg Chest 2 View  Result Date: 03/17/2018 CLINICAL DATA:  Cough, fever x couple days, recently had kidney stents placed cough, fever EXAM: CHEST - 2 VIEW COMPARISON:  Chest radiograph 02/25/2018, CT 01/24/2018 FINDINGS: Stable cardiac silhouette. There is increased RIGHT perihilar / suprahilar density compared to recent radiograph with a sharp arc like border. Favor this representing combination of the SVC and ectatic ascending aorta. Central venous congestion noted. No pulmonary edema. No pleural fluid. IMPRESSION: 1.  Extra shadow along the RIGHT mediastinum is favored benign vascular shadow. 2. Central venous congestion. Electronically Signed   By: Suzy Bouchard M.D.   On: 03/17/2018 15:37   Ct Abdomen Pelvis W Contrast  Result Date: 03/17/2018 CLINICAL DATA:  Left flank pain. Dysuria. Bilateral ureteral stents. EXAM: CT ABDOMEN AND PELVIS WITH CONTRAST TECHNIQUE: Multidetector CT imaging of the abdomen and pelvis was performed using the standard protocol following bolus administration of intravenous contrast. CONTRAST:  4mL OMNIPAQUE IOHEXOL 300 MG/ML  SOLN COMPARISON:  CT scan dated 02/25/2018 FINDINGS: Lower chest: No acute abnormality. Hepatobiliary: No focal liver abnormality is seen. No gallstones, gallbladder wall thickening, or biliary dilatation. Pancreas: Diffuse atrophy of the pancreas. No mass lesions. No ductal dilatation. Spleen: Normal in size without focal abnormality. Adrenals/Urinary Tract: Adrenal glands are normal. Bilateral ureteral stents are in place. Stable bilateral renal cysts. No hydronephrosis. Bladder appears normal except for the presence of  the stents. Stomach/Bowel: Stomach is within normal limits. Appendix appears normal. No evidence of bowel wall thickening, distention, or inflammatory changes. Vascular/Lymphatic: No significant vascular findings are present. No enlarged abdominal or pelvic lymph nodes. Reproductive: Uterus and bilateral adnexa are unremarkable. Other: No abdominal wall hernia or abnormality. No abdominopelvic ascites. 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 Broad-based disc protrusion. Gas is herniated into the right neural foramen. Severe spinal stenosis at that level, unchanged. IMPRESSION: No acute abnormalities. Bilateral ureteral stents in place with no residual hydronephrosis. 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 Electronically Signed   By: Lorriane Shire M.D.   On: 03/17/2018 17:07        Scheduled Meds: . amLODipine  10 mg Oral Daily  . atorvastatin  40 mg Oral Daily  . brimonidine  1 drop Both Eyes BID   And  . timolol  1 drop Both Eyes BID  . busPIRone  7.5 mg Oral TID  . cloNIDine  0.2 mg Oral TID  . colchicine  0.6 mg Oral Daily  . dabigatran  150 mg Oral BID  . donepezil  10 mg Oral QHS  . FLUoxetine  10 mg Oral Daily  . imipramine  50 mg Oral QHS  . insulin aspart  0-15 Units Subcutaneous TID WC  . insulin aspart  0-5 Units Subcutaneous QHS  . insulin glargine  40 Units Subcutaneous BID  . ipratropium-albuterol  3 mL Nebulization BID  . montelukast  10 mg Oral QHS  . olopatadine  1 drop Both Eyes BID  . oxyCODONE-acetaminophen  1 tablet Oral BID   And  . oxyCODONE  5 mg Oral BID  . pantoprazole  40 mg Oral Daily  . senna-docusate  2 tablet Oral QHS  . topiramate  100 mg Oral BID   Continuous Infusions: . ceFEPime (MAXIPIME) IV 1 g (03/18/18 1300)     LOS: 0 days    Time spent: 28 minutes.     Hosie Poisson, MD [facility] Pager  [phone]  If 7PM-7AM, please contact night-coverage [email or web address] Password [facility] 03/18/2018, 6:19 PM

## 2018-03-19 DIAGNOSIS — K219 Gastro-esophageal reflux disease without esophagitis: Secondary | ICD-10-CM

## 2018-03-19 DIAGNOSIS — N390 Urinary tract infection, site not specified: Secondary | ICD-10-CM | POA: Diagnosis not present

## 2018-03-19 DIAGNOSIS — Z86711 Personal history of pulmonary embolism: Secondary | ICD-10-CM

## 2018-03-19 DIAGNOSIS — E1165 Type 2 diabetes mellitus with hyperglycemia: Secondary | ICD-10-CM

## 2018-03-19 DIAGNOSIS — I10 Essential (primary) hypertension: Secondary | ICD-10-CM | POA: Diagnosis not present

## 2018-03-19 DIAGNOSIS — N3289 Other specified disorders of bladder: Secondary | ICD-10-CM | POA: Diagnosis not present

## 2018-03-19 DIAGNOSIS — R300 Dysuria: Secondary | ICD-10-CM | POA: Diagnosis not present

## 2018-03-19 DIAGNOSIS — Z794 Long term (current) use of insulin: Secondary | ICD-10-CM | POA: Diagnosis not present

## 2018-03-19 DIAGNOSIS — R319 Hematuria, unspecified: Secondary | ICD-10-CM | POA: Diagnosis not present

## 2018-03-19 LAB — CBC
HCT: 27 % — ABNORMAL LOW (ref 36.0–46.0)
HEMOGLOBIN: 7.8 g/dL — AB (ref 12.0–15.0)
MCH: 23.6 pg — ABNORMAL LOW (ref 26.0–34.0)
MCHC: 28.9 g/dL — ABNORMAL LOW (ref 30.0–36.0)
MCV: 81.6 fL (ref 80.0–100.0)
Platelets: 375 10*3/uL (ref 150–400)
RBC: 3.31 MIL/uL — AB (ref 3.87–5.11)
RDW: 16.1 % — ABNORMAL HIGH (ref 11.5–15.5)
WBC: 6 10*3/uL (ref 4.0–10.5)
nRBC: 0 % (ref 0.0–0.2)

## 2018-03-19 LAB — BASIC METABOLIC PANEL
Anion gap: 8 (ref 5–15)
BUN: 18 mg/dL (ref 8–23)
CO2: 20 mmol/L — ABNORMAL LOW (ref 22–32)
Calcium: 8.5 mg/dL — ABNORMAL LOW (ref 8.9–10.3)
Chloride: 107 mmol/L (ref 98–111)
Creatinine, Ser: 1.09 mg/dL — ABNORMAL HIGH (ref 0.44–1.00)
GFR calc Af Amer: 56 mL/min — ABNORMAL LOW (ref >60)
GFR calc non Af Amer: 49 mL/min — ABNORMAL LOW (ref >60)
Glucose, Bld: 158 mg/dL — ABNORMAL HIGH (ref 70–99)
Potassium: 4 mmol/L (ref 3.5–5.1)
Sodium: 135 mmol/L (ref 135–145)

## 2018-03-19 LAB — GLUCOSE, CAPILLARY
Glucose-Capillary: 123 mg/dL — ABNORMAL HIGH (ref 70–99)
Glucose-Capillary: 152 mg/dL — ABNORMAL HIGH (ref 70–99)
Glucose-Capillary: 122 mg/dL — ABNORMAL HIGH (ref 70–99)
Glucose-Capillary: 112 mg/dL — ABNORMAL HIGH (ref 70–99)

## 2018-03-19 MED ORDER — IPRATROPIUM-ALBUTEROL 0.5-2.5 (3) MG/3ML IN SOLN: 3.0000 mL | RESPIRATORY_TRACT | Status: DC | PRN

## 2018-03-19 MED ORDER — SODIUM CHLORIDE 0.9 % IV SOLN
1.0000 g | INTRAVENOUS | Status: DC
  Administered 2018-03-19: 1 g via INTRAVENOUS
  Filled 2018-03-19: qty 10

## 2018-03-19 NOTE — Progress Notes (Signed)
Subjective: Patient apparently having bladder spasms.  Fairly comfortable.  Objective: Vital signs in last 24 hours: Temp:  [97.6 F (36.4 C)-100.5 F (38.1 C)] 97.6 F (36.4 C) (02/02 0623) Pulse Rate:  [81-90] 81 (02/02 0623) Resp:  [14-18] 18 (02/01 2146) BP: (129-142)/(58-74) 129/58 (02/02 0623) SpO2:  [94 %-97 %] 95 % (02/02 0623)  Intake/Output from previous day: 02/01 0701 - 02/02 0700 In: 700 [P.O.:600; IV Piggyback:100] Out: 300 [Urine:300] Intake/Output this shift: No intake/output data recorded.  Physical Exam:  Constitutional: Vital signs reviewed.  Patient sleeping.  Family member at her side. Pulmonary/Chest: Normal effort  Lab Results: Recent Labs    03/17/18 1222 03/19/18 0507  HGB 8.9* 7.8*  HCT 30.6* 27.0*   BMET Recent Labs    03/18/18 1134 03/19/18 0507  NA 133* 135  K 3.6 4.0  CL 105 107  CO2 20* 20*  GLUCOSE 186* 158*  BUN 19 18  CREATININE 1.11* 1.09*  CALCIUM 8.3* 8.5*   No results for input(s): LABPT, INR in the last 72 hours. No results for input(s): LABURIN in the last 72 hours. Results for orders placed or performed during the hospital encounter of 03/17/18  Blood culture (routine x 2)     Status: None (Preliminary result)   Collection Time: 03/17/18  1:27 PM  Result Value Ref Range Status   Specimen Description BLOOD LEFT ANTECUBITAL  Final   Special Requests   Final    BOTTLES DRAWN AEROBIC AND ANAEROBIC Blood Culture results may not be optimal due to an excessive volume of blood received in culture bottles Performed at [facility], [place] [address], [place], [place] [address]    Culture NO GROWTH < 24 HOURS  Final   Report Status PENDING  Incomplete  Blood culture (routine x 2)     Status: None (Preliminary result)   Collection Time: 03/17/18  1:32 PM  Result Value Ref Range Status   Specimen Description BLOOD RIGHT ANTECUBITAL  Final   Special Requests   Final    BOTTLES DRAWN AEROBIC AND ANAEROBIC  Blood Culture results may not be optimal due to an excessive volume of blood received in culture bottles Performed at [facility], [place] [address] [place], [place] [address]    Culture NO GROWTH < 24 HOURS  Final   Report Status PENDING  Incomplete  Urine culture     Status: None   Collection Time: 03/17/18  2:47 PM  Result Value Ref Range Status   Specimen Description   Final    URINE, CLEAN CATCH Performed at [facility], [place] [address], [place], [place] [address]    Special Requests   Final    NONE Performed at [facility], [place] [address] [place], [place] [address]    Culture   Final    NO GROWTH Performed at [facility], [place] [address] [place], [place] [address]    Report Status 03/18/2018 FINAL  Final    Studies/Results: Dg Chest 2 View  Result Date: 03/17/2018 CLINICAL DATA:  Cough, fever x couple days, recently had kidney stents placed cough, fever EXAM: CHEST - 2 VIEW COMPARISON:  Chest radiograph 02/25/2018, CT 01/24/2018 FINDINGS: Stable cardiac silhouette. There is increased RIGHT perihilar / suprahilar density compared to recent radiograph with a sharp arc like border. Favor this representing combination of the SVC and ectatic ascending aorta. Central venous congestion noted. No pulmonary edema. No pleural fluid. IMPRESSION: 1. Extra shadow  along the RIGHT mediastinum is favored benign vascular shadow. 2. Central venous congestion. Electronically Signed   By: Suzy Bouchard M.D.   On: 03/17/2018 15:37   Ct Abdomen Pelvis W Contrast  Result Date: 03/17/2018 CLINICAL DATA:  Left flank pain. Dysuria. Bilateral ureteral stents. EXAM: CT ABDOMEN AND PELVIS WITH CONTRAST TECHNIQUE: Multidetector CT imaging of the abdomen and pelvis was performed using the standard protocol following bolus administration of intravenous contrast. CONTRAST:  1mL OMNIPAQUE IOHEXOL 300 MG/ML  SOLN COMPARISON:  CT scan dated  02/25/2018 FINDINGS: Lower chest: No acute abnormality. Hepatobiliary: No focal liver abnormality is seen. No gallstones, gallbladder wall thickening, or biliary dilatation. Pancreas: Diffuse atrophy of the pancreas. No mass lesions. No ductal dilatation. Spleen: Normal in size without focal abnormality. Adrenals/Urinary Tract: Adrenal glands are normal. Bilateral ureteral stents are in place. Stable bilateral renal cysts. No hydronephrosis. Bladder appears normal except for the presence of the stents. Stomach/Bowel: Stomach is within normal limits. Appendix appears normal. No evidence of bowel wall thickening, distention, or inflammatory changes. Vascular/Lymphatic: No significant vascular findings are present. No enlarged abdominal or pelvic lymph nodes. Reproductive: Uterus and bilateral adnexa are unremarkable. Other: No abdominal wall hernia or abnormality. No abdominopelvic ascites. 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 Broad-based disc protrusion. Gas is herniated into the right neural foramen. Severe spinal stenosis at that level, unchanged. IMPRESSION: No acute abnormalities. Bilateral ureteral stents in place with no residual hydronephrosis. 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 Electronically Signed   By: Lorriane Shire M.D.   On: 03/17/2018 17:07    Assessment/Plan:   Admitted for bladder pain probably secondary to recent stent placement.  Urine culture is negative.    She has ureteroscopy scheduled for this Tuesday.  From her urinary standpoint this should still be able to be performed.  I will communicate with Dr. Junious Silk who will be doing the procedure.    Okay from GU standpoint for discharge at any point.  Would continue oral antibiotic.   LOS: 0 days   Jorja Loa 03/19/2018, 7:53 AM

## 2018-03-19 NOTE — Progress Notes (Signed)
PROGRESS NOTE    [name]  JTT:[id number] DOB: 07-22-1940 DOA: 03/17/2018 PCP: [name], MD    Brief Narrative:[name] is a 78 y.o. female with medical history significant of iron deficiency anemia, atrial fibrillation on pradaxa, dementia, DM, pulmonary embolism in the past, migraines, h/o ureteral stones recently admitted for hydronephrosis and underwent Cystoscopy, bilateral retrograde pyelogram, bilateral ureteral stent placement; Bladder biopsy fulguration 0.5 to 2 cm by Dr Junious Silk and discharged on keflex presents with urinary symptoms, fevers and flank pain since 2 to 3 days and associated sob, wheezing and coughing.   Assessment & Plan:   Active Problems:   UTI (urinary tract infection)  Complicated UTI with H/o recent hydronephrosis:  S/p   Cystoscopy, bilateral retrograde pyelogram, bilateral ureteral stent placement; Bladder biopsy fulguration 0.5 to 2 cm, last admission and her CT this admission does not show any residual hydronephrosis Completed 2 days of IV cefepime and is on 3 rd day. Patient urine cultures are negative so far. Overnight patient had fever of 100.5, but blood cultres have been negative so far.  Pt continues to have bladder spasms.  Appreciate Urology recommendations.     Paroxysmal atrial fibrillation: Rate controlled.     Pulmonary embolism: Resume pradaxa.    Hypertension:  Better controlled today.    Sob, cough and wheezing Possibly acute bronchitis.  Continue with duoneb.  CXR does not show pneumonia.    Anemia of chronic disease: Transfuse to keep hemoglobin greater than 7. Hemoglobin is stable at 7.8.     AKI:  Improved, creatinine is 1.09.   DVT prophylaxis: Pradaxa.  Code Status:  Full code.  Family Communication: family at bedside.  Disposition Plan: pending clinical improvement.    Consultants:  Urology.   Procedures: NOne.    Antimicrobials: cefepime since admission.     Subjective: Continues to have bladder spasms,  Fever of 100.5 overnight.  Wheezing has improved.   Objective: Vitals:   03/18/18 2158 03/19/18 0623 03/19/18 0843 03/19/18 1345  BP:  (!) 129/58  (!) 144/66  Pulse:  81  82  Resp:    17  Temp:  97.6 F (36.4 C)  97.8 F (36.6 C)  TempSrc:    Oral  SpO2: 97% 95% 96% 95%  Weight:      Height:        Intake/Output Summary (Last 24 hours) at 03/19/2018 1703 Last data filed at 03/19/2018 1628 Gross per 24 hour  Intake 220 ml  Output 950 ml  Net -730 ml   Filed Weights   03/17/18 2234  Weight: 105.5 kg    Examination:  General exam: alert and not in distress.  Respiratory system: diminished at bases, no wheezing or rhonchi.  Cardiovascular system: S1 & S2 heard, RRR. No JVD, murmurs, Gastrointestinal system: Abdomen is  Soft tender generalized, bowel sounds good. Supra pubic tenderness.  Central nervous system: Alert and oriented. Non focal.  Extremities: Symmetric 5 x 5 power. Skin: No rashes, lesions or ulcers Psychiatry: Mood & affect appropriate.     Data Reviewed: I have personally reviewed following labs and imaging studies  CBC: Recent Labs  Lab 03/15/18 1506 03/17/18 1222 03/19/18 0507  WBC 14.1* 9.7 6.0  NEUTROABS  --  6.1  --   HGB 9.1* 8.9* 7.8*  HCT 29.2* 30.6* 27.0*  MCV 76.8* 82.5 81.6  PLT 564* 404* 009   Basic Metabolic Panel: Recent Labs  Lab 03/15/18 1506 03/17/18 1222 03/18/18 1134 03/19/18 0507  NA 138 133* 133* 135  K 4.1 3.9 3.6 4.0  CL 105 101 105 107  CO2 18* 20* 20* 20*  GLUCOSE 176* 174* 186* 158*  BUN 26* 21 19 18   CREATININE 1.19* 1.29* 1.11* 1.09*  CALCIUM 9.5 8.8* 8.3* 8.5*   GFR: Estimated Creatinine Clearance: 52.2 mL/min (A) (by C-G formula based on SCr of 1.09 mg/dL (H)). Liver Function Tests: Recent Labs  Lab 03/17/18 1326  AST 16  ALT 12  ALKPHOS 79  BILITOT 0.3  PROT 7.2  ALBUMIN 3.4*   No results for input(s): LIPASE, AMYLASE in the last 168  hours. No results for input(s): AMMONIA in the last 168 hours. Coagulation Profile: No results for input(s): INR, PROTIME in the last 168 hours. Cardiac Enzymes: No results for input(s): CKTOTAL, CKMB, CKMBINDEX, TROPONINI in the last 168 hours. BNP (last 3 results) No results for input(s): PROBNP in the last 8760 hours. HbA1C: No results for input(s): HGBA1C in the last 72 hours. CBG: Recent Labs  Lab 03/18/18 1643 03/18/18 2142 03/19/18 0735 03/19/18 1211 03/19/18 1624  GLUCAP 126* 204* 123* 152* 122*   Lipid Profile: No results for input(s): CHOL, HDL, LDLCALC, TRIG, CHOLHDL, LDLDIRECT in the last 72 hours. Thyroid Function Tests: No results for input(s): TSH, T4TOTAL, FREET4, T3FREE, THYROIDAB in the last 72 hours. Anemia Panel: No results for input(s): VITAMINB12, FOLATE, FERRITIN, TIBC, IRON, RETICCTPCT in the last 72 hours. Sepsis Labs: Recent Labs  Lab 03/17/18 1327 03/17/18 2238  LATICACIDVEN 1.1 1.0    Recent Results (from the past 240 hour(s))  Blood culture (routine x 2)     Status: None (Preliminary result)   Collection Time: 03/17/18  1:27 PM  Result Value Ref Range Status   Specimen Description BLOOD LEFT ANTECUBITAL  Final   Special Requests   Final    BOTTLES DRAWN AEROBIC AND ANAEROBIC Blood Culture results may not be optimal due to an excessive volume of blood received in culture bottles Performed at [facility], [place] [address], [place], [place] [address]    Culture NO GROWTH 2 DAYS  Final   Report Status PENDING  Incomplete  Blood culture (routine x 2)     Status: None (Preliminary result)   Collection Time: 03/17/18  1:32 PM  Result Value Ref Range Status   Specimen Description BLOOD RIGHT ANTECUBITAL  Final   Special Requests   Final    BOTTLES DRAWN AEROBIC AND ANAEROBIC Blood Culture results may not be optimal due to an excessive volume of blood received in culture bottles Performed at [facility], [place] [address], [place], [place] [address]    Culture NO GROWTH 2 DAYS  Final   Report Status PENDING  Incomplete  Urine culture     Status: None   Collection Time: 03/17/18  2:47 PM  Result Value Ref Range Status   Specimen Description   Final    URINE, CLEAN CATCH Performed at [facility], [place] [address] [place], [place] [address]    Special Requests   Final    NONE Performed at [facility], [place] [address] [place], [place] [address]    Culture   Final    NO GROWTH Performed at [facility], [place] [address], [place], [place] [address]    Report Status 03/18/2018 FINAL  Final         Radiology Studies: No results found.      Scheduled Meds: . amLODipine  10 mg Oral Daily  . atorvastatin  40 mg Oral Daily  . brimonidine  1 drop Both Eyes BID   And  . timolol  1 drop Both Eyes BID  . busPIRone  7.5 mg Oral TID  . cloNIDine  0.2 mg Oral TID  . colchicine  0.6 mg Oral Daily  . dabigatran  150 mg Oral BID  . donepezil  10 mg Oral QHS  . FLUoxetine  10 mg Oral Daily  . imipramine  50 mg Oral QHS  . insulin aspart  0-15 Units Subcutaneous TID WC  . insulin aspart  0-5 Units Subcutaneous QHS  . insulin glargine  40 Units Subcutaneous BID  . montelukast  10 mg Oral QHS  . olopatadine  1 drop Both Eyes BID  . oxyCODONE-acetaminophen  1 tablet Oral BID   And  . oxyCODONE  5 mg Oral BID  . pantoprazole  40 mg Oral Daily  . senna-docusate  2 tablet Oral QHS  . topiramate  100 mg Oral BID   Continuous Infusions: . ceFEPime (MAXIPIME) IV 1 g (03/19/18 1045)     LOS: 0 days    Time spent: 28 minutes.     Hosie Poisson, MD [facility] Pager [phone]  If 7PM-7AM, please contact night-coverage [email or web address] Password [email or web address] 03/19/2018, 5:03 PM

## 2018-03-20 DIAGNOSIS — N390 Urinary tract infection, site not specified: Secondary | ICD-10-CM | POA: Diagnosis not present

## 2018-03-20 DIAGNOSIS — E1165 Type 2 diabetes mellitus with hyperglycemia: Secondary | ICD-10-CM | POA: Diagnosis not present

## 2018-03-20 DIAGNOSIS — I10 Essential (primary) hypertension: Secondary | ICD-10-CM | POA: Diagnosis not present

## 2018-03-20 DIAGNOSIS — Z794 Long term (current) use of insulin: Secondary | ICD-10-CM | POA: Diagnosis not present

## 2018-03-20 DIAGNOSIS — R319 Hematuria, unspecified: Secondary | ICD-10-CM | POA: Diagnosis not present

## 2018-03-20 LAB — GLUCOSE, CAPILLARY
GLUCOSE-CAPILLARY: 98 mg/dL (ref 70–99)
Glucose-Capillary: 190 mg/dL — ABNORMAL HIGH (ref 70–99)

## 2018-03-20 MED ORDER — POLYETHYLENE GLYCOL 3350 17 G PO PACK: 17.0000 g | PACK | Freq: Every day | ORAL | 0 refills | Status: DC

## 2018-03-20 MED ORDER — DOCUSATE SODIUM 100 MG PO CAPS: 100.0000 mg | ORAL_CAPSULE | Freq: Every day | ORAL | 0 refills | Status: DC | PRN

## 2018-03-20 MED ORDER — POLYETHYLENE GLYCOL 3350 17 G PO PACK
17.0000 g | PACK | Freq: Every day | ORAL | Status: DC
  Administered 2018-03-20: 17 g via ORAL
  Filled 2018-03-20: qty 1

## 2018-03-20 MED ORDER — CEFPODOXIME PROXETIL 200 MG PO TABS: 200.0000 mg | ORAL_TABLET | Freq: Two times a day (BID) | ORAL | 0 refills | Status: DC

## 2018-03-20 NOTE — Progress Notes (Signed)
D/C ing to home w/ family. Awaiting ride. CM just gave the ok that [facility] is set up.foley drng red/pink patent.

## 2018-03-20 NOTE — Evaluation (Signed)
Physical Therapy Evaluation Patient Details Name: [name] MRN: [id number] DOB: 1941-01-13 Today's Date: 03/20/2018   History of Present Illness  78 yo female admitted with UTI. Recent procedure-placement of bil ureteral stents 02/27/18. Hx of dementia, anemia, Afib, chronic pain, DM, PE.  Clinical Impression  On eval, pt required Min assist for mobility. She walked a short distance around the room using a RW. Ambulation distance was limited by pain. Pt reported 10/10 abdominal pain-per chart she had been medicated prior to this session. No family present during session. Recommend HHPT and 24 hour supervision/assist.     Follow Up Recommendations Home health PT;Supervision/Assistance - 24 hour    Equipment Recommendations  None recommended by PT    Recommendations for Other Services       Precautions / Restrictions Precautions Precautions: Fall Restrictions Weight Bearing Restrictions: No      Mobility  Bed Mobility               General bed mobility comments: oob in recliner  Transfers Overall transfer level: Needs assistance Equipment used: Rolling walker (2 wheeled) Transfers: Sit to/from Stand Sit to Stand: Min assist         General transfer comment: Assist to rise, stabilize, control descent. VCs safety, technique, hand placement.   Ambulation/Gait Ambulation/Gait assistance: Min guard Gait Distance (Feet): 12 Feet Assistive device: Rolling walker (2 wheeled) Gait Pattern/deviations: Step-through pattern;Trunk flexed     General Gait Details: Walked in room only at this time. Ambulation distance limited by abdominal pain. Close guard for safety. Slow gait speed.   Stairs            Wheelchair Mobility    Modified Rankin (Stroke Patients Only)       Balance Overall balance assessment: Needs assistance         Standing balance support: Bilateral upper extremity supported Standing balance-Leahy Scale: Poor                                Pertinent Vitals/Pain Pain Assessment: 0-10 Pain Score: 10-Worst pain ever Pain Location: lower abdomen Pain Descriptors / Indicators: Discomfort;Grimacing;Guarding Pain Intervention(s): Premedicated before session    Home Living Family/patient expects to be discharged to:: Private residence Living Arrangements: Children Available Help at Discharge: Family Type of Home: Mobile home Home Access: Stairs to enter Entrance Stairs-Rails: Right;Left;Can reach both Entrance Stairs-Number of Steps: 2-3 Home Layout: One level Home Equipment: Environmental consultant - 2 wheels;Cane - single point;Shower seat;Bedside commode;Wheelchair - manual      Prior Function Level of Independence: Needs assistance   Gait / Transfers Assistance Needed: household ambulator with RW  ADL's / Homemaking Assistance Needed: assisted by her daughter        Hand Dominance   Dominant Hand: Right    Extremity/Trunk Assessment   Upper Extremity Assessment Upper Extremity Assessment: Generalized weakness    Lower Extremity Assessment Lower Extremity Assessment: Generalized weakness    Cervical / Trunk Assessment Cervical / Trunk Assessment: Normal  Communication   Communication: No difficulties  Cognition Arousal/Alertness: Awake/alert Behavior During Therapy: WFL for tasks assessed/performed Overall Cognitive Status: History of cognitive impairments - at baseline                                        General Comments      Exercises  Assessment/Plan    PT Assessment Patient needs continued PT services  PT Problem List Decreased strength;Decreased balance;Decreased mobility;Decreased activity tolerance;Pain;Decreased knowledge of use of DME       PT Treatment Interventions DME instruction;Gait training;Functional mobility training;Therapeutic activities;Balance training;Patient/family education;Therapeutic exercise    PT Goals (Current goals can be found in the  Care Plan section)  Acute Rehab PT Goals Patient Stated Goal: less pain PT Goal Formulation: With patient Time For Goal Achievement: 04/03/18 Potential to Achieve Goals: Good    Frequency Min 3X/week   Barriers to discharge        Co-evaluation               AM-PAC PT "6 Clicks" Mobility  Outcome Measure Help needed turning from your back to your side while in a flat bed without using bedrails?: A Little Help needed moving from lying on your back to sitting on the side of a flat bed without using bedrails?: A Little Help needed moving to and from a bed to a chair (including a wheelchair)?: A Little Help needed standing up from a chair using your arms (e.g., wheelchair or bedside chair)?: A Little Help needed to walk in hospital room?: A Little Help needed climbing 3-5 steps with a railing? : A Little 6 Click Score: 18    End of Session   Activity Tolerance: Patient limited by pain Patient left: in chair;with call bell/phone within reach;with chair alarm set   PT Visit Diagnosis: Unsteadiness on feet (R26.81);Pain;Muscle weakness (generalized) (M62.81) Pain - part of body: (abdomen)    Time: 0945-1000 PT Time Calculation (min) (ACUTE ONLY): 15 min   Charges:   PT Evaluation $PT Eval Moderate Complexity: [place], PT Acute Rehabilitation Services Pager: [phone] Office: [phone]

## 2018-03-20 NOTE — Progress Notes (Signed)
D/c for home. [place] chair. Awaiting ride and CM for [facility].

## 2018-03-20 NOTE — Progress Notes (Signed)
D/c ing w/ sister and niece via w/c . Prn tylenol given for c/o pain lower abd " for ride".

## 2018-03-20 NOTE — Anesthesia Preprocedure Evaluation (Addendum)
Anesthesia Evaluation  Patient identified by MRN, date of birth, ID band Patient awake    Reviewed: Allergy & Precautions, NPO status , Patient's Chart, lab work & pertinent test results  Airway Mallampati: III  TM Distance: >3 FB Neck ROM: Limited  Mouth opening: Limited Mouth Opening  Dental no notable dental hx. (+) Edentulous Upper, Dental Advisory Given   Pulmonary asthma , PE   Pulmonary exam normal + rhonchi  + wheezing      Cardiovascular hypertension, Pt. on medications Normal cardiovascular exam+ dysrhythmias Atrial Fibrillation  Rhythm:Regular Rate:Normal  Stress Test 2018 There was no ST segment deviation noted during stress. The study is normal. There are no perfusion defects consistent with prior infarct or current ischemia. This is a low risk study based on perfusion images The left ventricular ejection fraction is hyperdynamic (>65%).   Neuro/Psych  Headaches, PSYCHIATRIC DISORDERS Anxiety Depression Dementia    GI/Hepatic Neg liver ROS, GERD  Medicated and Poorly Controlled,  Endo/Other  negative endocrine ROSdiabetes, Type 2, Insulin Dependent  Renal/GU negative Renal ROS  negative genitourinary   Musculoskeletal  (+) Arthritis ,   Abdominal   Peds  Hematology  (+) Blood dyscrasia, anemia , Hgb 7.8   Anesthesia Other Findings On pradaxa, last dose yesterday PM  Fentanyl patch intolerance -"itching"  Reproductive/Obstetrics                         Anesthesia Physical Anesthesia Plan  ASA: III  Anesthesia Plan: General   Post-op Pain Management:    Induction: Intravenous  PONV Risk Score and Plan: 3 and Treatment may vary due to age or medical condition, Ondansetron and Dexamethasone  Airway Management Planned: Oral ETT  Additional Equipment:   Intra-op Plan:   Post-operative Plan: Extubation in OR  Informed Consent: I have reviewed the patients History and  Physical, chart, labs and discussed the procedure including the risks, benefits and alternatives for the proposed anesthesia with the patient or authorized representative who has indicated his/her understanding and acceptance.     Dental advisory given  Plan Discussed with: CRNA  Anesthesia Plan Comments:        Anesthesia Quick Evaluation

## 2018-03-20 NOTE — Progress Notes (Signed)
D/c foley and pt must void prior to d/c per Dr Karleen Hampshire. Instructed pt on all d/c instructions w/ verbal undertanding.

## 2018-03-20 NOTE — Progress Notes (Signed)
Occupational Therapy Evaluation Patient Details Name: [name] MRN: [id number] DOB: [birth year] Today's Date: 03/20/2018    History of Present Illness 78 yo female admitted with UTI. Recent procedure-placement of bil ureteral stents 02/27/18. Hx of dementia, anemia, Afib, chronic pain, DM, PE.   Clinical Impression   PTA, pt lived at home with her daughter and son-in law and mobilized using a RW and required occasional A with ADL. Pt easily fatigues. Feel pt would benefit from [facility] to maximize independence with ADL and reduce risk of falls. Defer further OT to [facility]. CM notified of DC recommendations.     Follow Up Recommendations  Home health OT;Supervision/Assistance - 24 hour    Equipment Recommendations  None recommended by OT    Recommendations for Other Services       Precautions / Restrictions Precautions Precautions: Fall Restrictions Weight Bearing Restrictions: No      Mobility Bed Mobility               General bed mobility comments: oob in recliner  Transfers Overall transfer level: Needs assistance Equipment used: Rolling walker (2 wheeled) Transfers: Sit to/from Stand Sit to Stand: Min guard         General transfer comment: Assist to rise, stabilize, control descent. VCs safety, technique, hand placement.     Balance Overall balance assessment: Needs assistance   Sitting balance-Leahy Scale: Good     Standing balance support: Bilateral upper extremity supported Standing balance-Leahy Scale: Poor                             ADL either performed or assessed with clinical judgement   ADL Overall ADL's : Needs assistance/impaired Eating/Feeding: Modified independent   Grooming: Set up;Standing;Sitting   Upper Body Bathing: Set up;Sitting   Lower Body Bathing: Minimal assistance;Sit to/from stand   Upper Body Dressing : Minimal assistance;Sitting   Lower Body Dressing: Moderate assistance;Sit to/from stand   Toilet  Transfer: Minimal assistance;Ambulation;RW     Toileting - Clothing Manipulation Details (indicate cue type and reason): Has foley; did not remember having stents     Functional mobility during ADLs: Min guard;Rolling walker General ADL Comments: Difficulty reaching feet; easily fatigues     Vision         Perception     Praxis      Pertinent Vitals/Pain Pain Assessment: Faces Pain Score: 10-Worst pain ever Faces Pain Scale: Hurts little more Pain Location: lower abdomen Pain Descriptors / Indicators: Discomfort;Grimacing;Guarding Pain Intervention(s): Limited activity within patient's tolerance     Hand Dominance Right   Extremity/Trunk Assessment Upper Extremity Assessment Upper Extremity Assessment: (B shoulder deficits at baseline)   Lower Extremity Assessment Lower Extremity Assessment: Defer to PT evaluation   Cervical / Trunk Assessment Cervical / Trunk Assessment: Normal   Communication Communication Communication: No difficulties   Cognition Arousal/Alertness: Awake/alert Behavior During Therapy: WFL for tasks assessed/performed Overall Cognitive Status: History of cognitive impairments - at baseline                                     General Comments       Exercises     Shoulder Instructions      Home Living Family/patient expects to be discharged to:: Private residence Living Arrangements: Children Available Help at Discharge: Family Type of Home: Mobile home Home Access: Stairs to enter  Entrance Stairs-Number of Steps: 2-3 Entrance Stairs-Rails: Right;Left;Can reach both Home Layout: One level     Bathroom Shower/Tub: Occupational psychologist: Standard Bathroom Accessibility: Yes How Accessible: Accessible via walker Home Equipment: [place] - 2 wheels;Cane - single point;Shower seat;Bedside commode;Wheelchair - manual;Grab bars - tub/shower          Prior Functioning/Environment Level of Independence:  Needs assistance  Gait / Transfers Assistance Needed: household ambulator with RW ADL's / Homemaking Assistance Needed: assisted by her daughter with bathintg and dressing            OT Problem List: Decreased strength;Decreased activity tolerance;Impaired balance (sitting and/or standing);Decreased safety awareness;Decreased cognition;Decreased knowledge of use of DME or AE;Obesity;Pain      OT Treatment/Interventions:      OT Goals(Current goals can be found in the care plan section) Acute Rehab OT Goals Patient Stated Goal: less pain OT Goal Formulation: All assessment and education complete, DC therapy(further addessed by [facility])  OT Frequency:     Barriers to D/C:            Co-evaluation              AM-PAC OT "6 Clicks" Daily Activity     Outcome Measure Help from another person eating meals?: None Help from another person taking care of personal grooming?: A Little Help from another person toileting, which includes using toliet, bedpan, or urinal?: A Little Help from another person bathing (including washing, rinsing, drying)?: A Little Help from another person to put on and taking off regular upper body clothing?: A Little Help from another person to put on and taking off regular lower body clothing?: A Little 6 Click Score: 19   End of Session Equipment Utilized During Treatment: Rolling walker Nurse Communication: Mobility status  Activity Tolerance: Patient tolerated treatment well Patient left: in chair;with call bell/phone within reach;with chair alarm set  OT Visit Diagnosis: Unsteadiness on feet (R26.81);Muscle weakness (generalized) (M62.81);Other symptoms and signs involving cognitive function;Pain Pain - part of body: (lower abdomen)                Time: 6759-1638 OT Time Calculation (min): 13 min Charges:  OT General Charges $OT Visit: 1 Visit OT Evaluation $OT Eval Low Complexity: [place], OT/L   Acute OT Clinical  Specialist [place] Pager [phone] Office [phone]   [facility] 03/20/2018, 11:06 AM

## 2018-03-20 NOTE — Care Management Note (Signed)
Case Management Note  Patient Details  Name: [name] MRN: [id number] Date of Birth: [birth year]  Subjective/Objective: UTI. From home w/daughter. Has rw,3n1. Spoke to dtr/son-John on phone per patient request-informed of OBS status, & uteroscopy scheduled for tomorrow as outpatient. Dtr/Son voiced understanding. [place] ordered-[facility] chosen rep Santiago Glad aware. Per daughter-patient's sister will transport home. [facility]-spoke to Mickel Baas (nurse)-informed her that I have sopken to patient's dtr/son/patient & informed that the urology procedure is outpatient. No further CM needs.                    Action/Plan:dc home.   Expected Discharge Date:  03/20/18               Expected Discharge Plan:  [place]  In-House Referral:  Clinical Social Work  Discharge planning Services  CM Consult  Post Acute Care Choice:  Durable Medical Equipment(rw,3n1) Choice offered to:  Adult Children  DME Arranged:    DME Agency:     HH Arranged:  RN, PT, OT, Nurse's Aide, Social Work [employer] Agency:  [place]  Status of Service:  Completed, signed off  If discussed at [employer], dates discussed:    Additional Comments:  Dessa Phi, RN 03/20/2018, 1:58 PM

## 2018-03-21 ENCOUNTER — Ambulatory Visit (HOSPITAL_BASED_OUTPATIENT_CLINIC_OR_DEPARTMENT_OTHER): Payer: Medicare HMO | Admitting: Anesthesiology

## 2018-03-21 ENCOUNTER — Other Ambulatory Visit: Payer: Self-pay

## 2018-03-21 ENCOUNTER — Encounter (HOSPITAL_BASED_OUTPATIENT_CLINIC_OR_DEPARTMENT_OTHER): Payer: Self-pay | Admitting: *Deleted

## 2018-03-21 ENCOUNTER — Encounter (HOSPITAL_BASED_OUTPATIENT_CLINIC_OR_DEPARTMENT_OTHER)
Admission: RE | Disposition: A | Discharge status: Home / Self Care | Payer: Self-pay | Source: Ambulatory Visit | Attending: Urology

## 2018-03-21 ENCOUNTER — Telehealth: Payer: Self-pay

## 2018-03-21 ENCOUNTER — Ambulatory Visit (HOSPITAL_BASED_OUTPATIENT_CLINIC_OR_DEPARTMENT_OTHER)
Admission: RE | Admit: 2018-03-21 | Discharge: 2018-03-21 | Disposition: A | Discharge status: Home / Self Care | Payer: Medicare HMO | Source: Ambulatory Visit | Attending: Urology | Admitting: Urology

## 2018-03-21 DIAGNOSIS — Z6839 Body mass index (BMI) 39.0-39.9, adult: Secondary | ICD-10-CM | POA: Diagnosis not present

## 2018-03-21 DIAGNOSIS — Z79899 Other long term (current) drug therapy: Secondary | ICD-10-CM | POA: Insufficient documentation

## 2018-03-21 DIAGNOSIS — J45909 Unspecified asthma, uncomplicated: Secondary | ICD-10-CM | POA: Diagnosis not present

## 2018-03-21 DIAGNOSIS — E785 Hyperlipidemia, unspecified: Secondary | ICD-10-CM | POA: Diagnosis not present

## 2018-03-21 DIAGNOSIS — H409 Unspecified glaucoma: Secondary | ICD-10-CM | POA: Diagnosis not present

## 2018-03-21 DIAGNOSIS — E663 Overweight: Secondary | ICD-10-CM | POA: Insufficient documentation

## 2018-03-21 DIAGNOSIS — N132 Hydronephrosis with renal and ureteral calculous obstruction: Secondary | ICD-10-CM | POA: Diagnosis not present

## 2018-03-21 DIAGNOSIS — Z794 Long term (current) use of insulin: Secondary | ICD-10-CM | POA: Insufficient documentation

## 2018-03-21 DIAGNOSIS — F0390 Unspecified dementia without behavioral disturbance: Secondary | ICD-10-CM | POA: Insufficient documentation

## 2018-03-21 DIAGNOSIS — G4700 Insomnia, unspecified: Secondary | ICD-10-CM | POA: Diagnosis not present

## 2018-03-21 DIAGNOSIS — G43909 Migraine, unspecified, not intractable, without status migrainosus: Secondary | ICD-10-CM | POA: Diagnosis not present

## 2018-03-21 DIAGNOSIS — Z7901 Long term (current) use of anticoagulants: Secondary | ICD-10-CM | POA: Diagnosis not present

## 2018-03-21 DIAGNOSIS — F419 Anxiety disorder, unspecified: Secondary | ICD-10-CM | POA: Diagnosis not present

## 2018-03-21 DIAGNOSIS — I4891 Unspecified atrial fibrillation: Secondary | ICD-10-CM | POA: Insufficient documentation

## 2018-03-21 DIAGNOSIS — F329 Major depressive disorder, single episode, unspecified: Secondary | ICD-10-CM | POA: Insufficient documentation

## 2018-03-21 DIAGNOSIS — K219 Gastro-esophageal reflux disease without esophagitis: Secondary | ICD-10-CM | POA: Diagnosis not present

## 2018-03-21 DIAGNOSIS — N201 Calculus of ureter: Secondary | ICD-10-CM | POA: Diagnosis not present

## 2018-03-21 DIAGNOSIS — N200 Calculus of kidney: Secondary | ICD-10-CM

## 2018-03-21 DIAGNOSIS — M159 Polyosteoarthritis, unspecified: Secondary | ICD-10-CM

## 2018-03-21 DIAGNOSIS — Z79890 Hormone replacement therapy (postmenopausal): Secondary | ICD-10-CM | POA: Insufficient documentation

## 2018-03-21 DIAGNOSIS — G894 Chronic pain syndrome: Secondary | ICD-10-CM

## 2018-03-21 DIAGNOSIS — N202 Calculus of kidney with calculus of ureter: Secondary | ICD-10-CM | POA: Diagnosis not present

## 2018-03-21 DIAGNOSIS — I10 Essential (primary) hypertension: Secondary | ICD-10-CM | POA: Diagnosis not present

## 2018-03-21 DIAGNOSIS — Z86711 Personal history of pulmonary embolism: Secondary | ICD-10-CM | POA: Diagnosis not present

## 2018-03-21 DIAGNOSIS — Z96652 Presence of left artificial knee joint: Secondary | ICD-10-CM | POA: Insufficient documentation

## 2018-03-21 HISTORY — DX: Unspecified dementia, unspecified severity, without behavioral disturbance, psychotic disturbance, mood disturbance, and anxiety: F03.90

## 2018-03-21 HISTORY — PX: CYSTOSCOPY/URETEROSCOPY/HOLMIUM LASER/STENT PLACEMENT: SHX6546

## 2018-03-21 LAB — GLUCOSE, CAPILLARY
GLUCOSE-CAPILLARY: 120 mg/dL — AB (ref 70–99)
Glucose-Capillary: 130 mg/dL — ABNORMAL HIGH (ref 70–99)

## 2018-03-21 SURGERY — CYSTOSCOPY/URETEROSCOPY/HOLMIUM LASER/STENT PLACEMENT
Anesthesia: General | Site: Ureter | Laterality: Bilateral

## 2018-03-21 MED ORDER — ALBUTEROL SULFATE HFA 108 (90 BASE) MCG/ACT IN AERS
INHALATION_SPRAY | RESPIRATORY_TRACT | Status: AC
  Filled 2018-03-21: qty 6.7

## 2018-03-21 MED ORDER — FENTANYL CITRATE (PF) 100 MCG/2ML IJ SOLN
25.0000 ug | INTRAMUSCULAR | Status: DC | PRN
  Filled 2018-03-21: qty 1

## 2018-03-21 MED ORDER — KETOROLAC TROMETHAMINE 30 MG/ML IJ SOLN
INTRAMUSCULAR | Status: AC
  Filled 2018-03-21: qty 1

## 2018-03-21 MED ORDER — FENTANYL CITRATE (PF) 100 MCG/2ML IJ SOLN
INTRAMUSCULAR | Status: AC
  Filled 2018-03-21: qty 2

## 2018-03-21 MED ORDER — SODIUM CHLORIDE 0.9 % IR SOLN
Status: DC | PRN
  Administered 2018-03-21: 3000 mL via INTRAVESICAL

## 2018-03-21 MED ORDER — ONDANSETRON HCL 4 MG/2ML IJ SOLN
INTRAMUSCULAR | Status: DC | PRN
  Administered 2018-03-21: 4 mg via INTRAVENOUS

## 2018-03-21 MED ORDER — ROCURONIUM BROMIDE 100 MG/10ML IV SOLN
INTRAVENOUS | Status: AC
  Filled 2018-03-21: qty 1

## 2018-03-21 MED ORDER — DEXAMETHASONE SODIUM PHOSPHATE 10 MG/ML IJ SOLN
INTRAMUSCULAR | Status: DC | PRN
  Administered 2018-03-21: 5 mg via INTRAVENOUS

## 2018-03-21 MED ORDER — PROPOFOL 10 MG/ML IV BOLUS
INTRAVENOUS | Status: AC
  Filled 2018-03-21: qty 20

## 2018-03-21 MED ORDER — LIDOCAINE HCL URETHRAL/MUCOSAL 2 % EX GEL
CUTANEOUS | Status: DC | PRN
  Administered 2018-03-21: 1 via URETHRAL

## 2018-03-21 MED ORDER — PROPOFOL 10 MG/ML IV BOLUS
INTRAVENOUS | Status: DC | PRN
  Administered 2018-03-21: 50 mg via INTRAVENOUS
  Administered 2018-03-21: 30 mg via INTRAVENOUS

## 2018-03-21 MED ORDER — ROCURONIUM BROMIDE 100 MG/10ML IV SOLN
INTRAVENOUS | Status: DC | PRN
  Administered 2018-03-21: 30 mg via INTRAVENOUS

## 2018-03-21 MED ORDER — LIDOCAINE 2% (20 MG/ML) 5 ML SYRINGE
INTRAMUSCULAR | Status: AC
  Filled 2018-03-21: qty 5

## 2018-03-21 MED ORDER — LACTATED RINGERS IV SOLN
INTRAVENOUS | Status: DC
  Administered 2018-03-21: 07:00:00 via INTRAVENOUS
  Filled 2018-03-21: qty 1000

## 2018-03-21 MED ORDER — SODIUM CHLORIDE 0.9 % IV SOLN
INTRAVENOUS | Status: DC | PRN
  Administered 2018-03-21: 50 ug/min via INTRAVENOUS

## 2018-03-21 MED ORDER — PHENYLEPHRINE HCL 10 MG/ML IJ SOLN
INTRAMUSCULAR | Status: AC
  Filled 2018-03-21: qty 1

## 2018-03-21 MED ORDER — ALBUTEROL SULFATE HFA 108 (90 BASE) MCG/ACT IN AERS
INHALATION_SPRAY | RESPIRATORY_TRACT | Status: DC | PRN
  Administered 2018-03-21 (×2): 3 via RESPIRATORY_TRACT

## 2018-03-21 MED ORDER — CEFAZOLIN SODIUM-DEXTROSE 2-4 GM/100ML-% IV SOLN
INTRAVENOUS | Status: AC
  Filled 2018-03-21: qty 100

## 2018-03-21 MED ORDER — LIDOCAINE 2% (20 MG/ML) 5 ML SYRINGE
INTRAMUSCULAR | Status: DC | PRN
  Administered 2018-03-21: 100 mg via INTRAVENOUS

## 2018-03-21 MED ORDER — SUGAMMADEX SODIUM 200 MG/2ML IV SOLN
INTRAVENOUS | Status: DC | PRN
  Administered 2018-03-21: 200 mg via INTRAVENOUS

## 2018-03-21 MED ORDER — DEXAMETHASONE SODIUM PHOSPHATE 10 MG/ML IJ SOLN
INTRAMUSCULAR | Status: AC
  Filled 2018-03-21: qty 1

## 2018-03-21 MED ORDER — SUGAMMADEX SODIUM 200 MG/2ML IV SOLN
INTRAVENOUS | Status: AC
  Filled 2018-03-21: qty 2

## 2018-03-21 MED ORDER — KETOROLAC TROMETHAMINE 30 MG/ML IJ SOLN
INTRAMUSCULAR | Status: DC | PRN
  Administered 2018-03-21: 30 mg via INTRAVENOUS

## 2018-03-21 MED ORDER — ARTIFICIAL TEARS OPHTHALMIC OINT
TOPICAL_OINTMENT | OPHTHALMIC | Status: AC
  Filled 2018-03-21: qty 3.5

## 2018-03-21 MED ORDER — CEFAZOLIN SODIUM-DEXTROSE 2-4 GM/100ML-% IV SOLN
2.0000 g | Freq: Once | INTRAVENOUS | Status: AC
  Administered 2018-03-21: 2 g via INTRAVENOUS
  Filled 2018-03-21: qty 100

## 2018-03-21 MED ORDER — ONDANSETRON HCL 4 MG/2ML IJ SOLN
INTRAMUSCULAR | Status: AC
  Filled 2018-03-21: qty 2

## 2018-03-21 SURGICAL SUPPLY — 27 items
BAG DRAIN URO-CYSTO SKYTR STRL (DRAIN) ×3 IMPLANT
BASKET LASER NITINOL 1.9FR (BASKET) IMPLANT
BASKET ZERO TIP NITINOL 2.4FR (BASKET) ×3 IMPLANT
CATH URET 5FR 28IN CONE TIP (BALLOONS)
CATH URET 5FR 28IN OPEN ENDED (CATHETERS) ×3 IMPLANT
CATH URET 5FR 70CM CONE TIP (BALLOONS) IMPLANT
CATH URET DUAL LUMEN 6-10FR 50 (CATHETERS) IMPLANT
CLOTH BEACON ORANGE TIMEOUT ST (SAFETY) ×3 IMPLANT
DRSG TEGADERM 2-3/8X2-3/4 SM (GAUZE/BANDAGES/DRESSINGS) ×3 IMPLANT
FIBER LASER FLEXIVA 365 (UROLOGICAL SUPPLIES) IMPLANT
FIBER LASER TRAC TIP (UROLOGICAL SUPPLIES) IMPLANT
GLOVE BIO SURGEON STRL SZ7.5 (GLOVE) ×3 IMPLANT
GOWN STRL REUS W/TWL LRG LVL3 (GOWN DISPOSABLE) ×3 IMPLANT
GOWN STRL REUS W/TWL XL LVL3 (GOWN DISPOSABLE) IMPLANT
GUIDEWIRE ANG ZIPWIRE 038X150 (WIRE) IMPLANT
GUIDEWIRE STR DUAL SENSOR (WIRE) ×6 IMPLANT
INFUSOR MANOMETER BAG 3000ML (MISCELLANEOUS) ×3 IMPLANT
IV NS IRRIG 3000ML ARTHROMATIC (IV SOLUTION) ×6 IMPLANT
KIT TURNOVER CYSTO (KITS) ×3 IMPLANT
MANIFOLD NEPTUNE II (INSTRUMENTS) ×3 IMPLANT
NS IRRIG 500ML POUR BTL (IV SOLUTION) ×3 IMPLANT
PACK CYSTO (CUSTOM PROCEDURE TRAY) ×3 IMPLANT
SHEATH URETERAL 12FRX28CM (UROLOGICAL SUPPLIES) ×3 IMPLANT
STENT URET 6FRX24 CONTOUR (STENTS) ×6 IMPLANT
TUBE CONNECTING 12'X1/4 (SUCTIONS) ×1
TUBE CONNECTING 12X1/4 (SUCTIONS) ×2 IMPLANT
TUBING UROLOGY SET (TUBING) ×3 IMPLANT

## 2018-03-21 NOTE — Discharge Instructions (Signed)
Right and Left Ureteral Stent Implantation, Care After  Refer to this sheet in the next few weeks. These instructions provide you with information about caring for yourself after your procedure. Your health care provider may also give you more specific instructions. Your treatment has been planned according to current medical practices, but problems sometimes occur. Call your health care provider if you have any problems or questions after your procedure.  Removal of the stents: Remove the stents by pulling the strings with slow steady pressure on Monday, March 27, 2018  What can I expect after the procedure? After the procedure, it is common to have:  Nausea.  Mild pain when you urinate. You may feel this pain in your lower back or lower abdomen. Pain should stop within a few minutes after you urinate. This may last for up to 1 week.  A small amount of blood in your urine for several days.   Follow these instructions at home:  Medicines  Take over-the-counter and prescription medicines only as told by your health care provider.  If you were prescribed an antibiotic medicine, take it as told by your health care provider. Do not stop taking the antibiotic even if you start to feel better.  Do not drive for 24 hours if you received a sedative.  Do not drive or operate heavy machinery while taking prescription pain medicines. Activity  Return to your normal activities as told by your health care provider. Ask your health care provider what activities are safe for you.  Do not lift anything that is heavier than 10 lb (4.5 kg). Follow this limit for 1 week after your procedure, or for as long as told by your health care provider. General instructions  Watch for any blood in your urine. Call your health care provider if the amount of blood in your urine increases.  ? Do not take baths, swim, or use a hot tub until your health care provider approves.  Drink enough fluid to keep your  urine clear or pale yellow.    Keep all follow-up visits as told by your health care provider. This is important.   Contact a health care provider if:  You have pain that gets worse or does not get better with medicine, especially pain when you urinate.  You have difficulty urinating.  You feel nauseous or you vomit repeatedly during a period of more than 2 days after the procedure. Get help right away if:  Your urine is dark red or has blood clots in it.  The end of the stent comes out of your urethra.  You cannot urinate.  You have sudden, sharp, or severe pain in your abdomen or lower back.  You have a fever.   This information is not intended to replace advice given to you by your health care provider. Make sure you discuss any questions you have with your health care provider. Document Released: 10/04/2012 Document Revised: 07/10/2015 Document Reviewed: 08/16/2014 Elsevier Interactive Patient Education  2019 [place] Anesthesia Home Care Instructions  Activity: Get plenty of rest for the remainder of the day. A responsible individual must stay with you for 24 hours following the procedure.  For the next 24 hours, DO NOT: -Drive a car -Paediatric nurse -Drink alcoholic beverages -Take any medication unless instructed by your physician -Make any legal decisions or sign important papers.  Meals: Start with liquid foods such as gelatin or soup. Progress to regular foods as tolerated. Avoid greasy,  spicy, heavy foods. If nausea and/or vomiting occur, drink only clear liquids until the nausea and/or vomiting subsides. Call your physician if vomiting continues.  Special Instructions/Symptoms: Your throat may feel dry or sore from the anesthesia or the breathing tube placed in your throat during surgery. If this causes discomfort, gargle with warm salt water. The discomfort should disappear within 24 hours.  If you had a scopolamine patch placed behind  your ear for the management of post- operative nausea and/or vomiting:  1. The medication in the patch is effective for 72 hours, after which it should be removed.  Wrap patch in a tissue and discard in the trash. Wash hands thoroughly with soap and water. 2. You may remove the patch earlier than 72 hours if you experience unpleasant side effects which may include dry mouth, dizziness or visual disturbances. 3. Avoid touching the patch. Wash your hands with soap and water after contact with the patch.    May take Ibuprofen, Advil, Motrin, or Aleve at 2:30 pm.

## 2018-03-21 NOTE — Transfer of Care (Signed)
Immediate Anesthesia Transfer of Care Note  Patient: [name]  Procedure(s) Performed: CYSTOSCOPY/URETEROSCOPY/HOLMIUM LASER/STENT PLACEMENT (Bilateral Ureter)  Patient Location: PACU  Anesthesia Type:General  Level of Consciousness: awake, alert , oriented and patient cooperative  Airway & Oxygen Therapy: Patient Spontanous Breathing and Patient connected to face mask oxygen  Post-op Assessment: Report given to RN and Post -op Vital signs reviewed and stable  Post vital signs: Reviewed and stable  Last Vitals:  Vitals Value Taken Time  BP    Temp    Pulse 105 03/21/2018  9:08 AM  Resp 16 03/21/2018  9:08 AM  SpO2 82 % 03/21/2018  9:08 AM  Vitals shown include unvalidated device data.  Last Pain:  Vitals:   03/21/18 0552  TempSrc: Oral         Complications: No apparent anesthesia complications

## 2018-03-21 NOTE — Telephone Encounter (Signed)
Transition Care Management Follow-up Telephone Call   Date discharged?   03-21-18      How have you been since you were released from the hospital? "SShe is doing better, some pain still, but better than she was." "she is really tired today"   Do you understand why you were in the hospital? Yes kidney stones, had stents put in   Do you understand the discharge instructions? Yes    Where were you discharged to? Home    Items Reviewed:  Medications reviewed: Yes   Allergies reviewed: Yes   Dietary changes reviewed: Yes   Referrals reviewed: Yes    Functional Questionnaire:   Activities of Daily Living (ADLs):  Sister and niece are helping her     Any transportation issues/concerns?: None    Any patient concerns? None    Confirmed importance and date/time of follow-up visits scheduled Has an appointment on Apr 06, 2018 with doctor Moshe Cipro      Confirmed with patient if condition begins to worsen call PCP or go to the ER.  Patient was given the office number and encouraged to call back with question or concerns.  :  Yes

## 2018-03-21 NOTE — Op Note (Signed)
Preoperative diagnosis: Left ureteral stone, right renal stone Postoperative diagnosis: Same  Procedure: Cystoscopy, bilateral ureteroscopy, Left stone basket extraction Left retrograde pyelogram Right laser lithotripsy, stone basket extraction Bilateral ureteral stent exchange  Surgeon: Junious Silk  Anesthesia: General  Indication for procedure: 78 year old female who underwent urgent left stenting due to UTI, left ureteral stone and hydro-a few weeks ago.  She also had a right lower pole stone and was stented for a staged procedure at that time as well.  She was brought today for definitive stone management.  Findings: The left mid ureteral stone was quite impacted given its small size.  I did drop into the distal ureter and was removed.  Left retrograde pyelogram-this outlined a single ureter single collecting system unit.  There was narrowing at the stone impaction site with proximal mild hydroureteronephrosis.  After about 45 minutes there was still some contrast in the calyces on the left and a column of contrast above the stone impaction site with narrowing and therefore this side was stented.  Right side appeared normal but had some erythema and mucosal bleeding from the stent and access sheath and I stented the right side as well.  The stone was located in the right lower pole.  Otherwise the urethra and bladder were unremarkable.  Prior biopsy sites well-healed.  Description of procedure: After consent was obtained patient brought to the operating room.  After adequate anesthesia she was placed in lithotomy position and prepped and draped in usual sterile fashion.  A timeout was performed to confirm the patient and procedure.  The cystoscope was passed per urethra and the bladder inspected.  I irrigated the bladder several times.  The left ureteral stent was then grasped and removed through the urethral meatus and a sensor wire was advanced through the stent and coiled in the collecting  system.  A semirigid ureteroscope was then advanced and the ureter narrowed at the stone impaction site.  With some manipulation of the wire it opened up just enough to get toward the stone.  As I was passing the 200 m laser fiber she coughed and clenched her legs and therefore I backed the scope out.  Anesthesia was adjusted and the ureteroscope was readvanced.  The stone and followed the scope down into the distal ureter.  A 0 tip basket was used to grab it and remove it.  I then repassed the semirigid scope and was able to get into the stone inspection site which had erythema and edema proximally the ureter appeared more dilated.  The scope was backed out.  I then passed a 5 [place] open-ended catheter into the proximal system and remove the wire.  There was a brisk hydronephrotic drip.  Retrograde injection of contrast was performed and injection continued as I pulled the 5 [place] open-ended catheter out.  The side will be observed.  Attention was turned to the right side with the cystoscope the right ureteral stent was grasped and removed.  A sensor wire was advanced into the right collecting system.  I used an access sheath to get 2 wires up and then went adjacent to 1 of the wires as a safety wire.  Flexible ureteroscope was advanced into the right kidney on the right lower stone was ensnared with a 0 tip basket.  It engaged the proximal ureter but it was narrow and the stone but not progressed.  Therefore I dropped the stone in the upper calyx and used the 200 m laser fiber at 0.8 and 8  and broke the stone in half.  The 2 pieces were removed sequentially with a 0 tip basket.  No other stones were noted.  The collecting system renal pelvis and ureter were inspected on the way out as I backed out the access sheath and the scope together.  The ureteral mucosa had typical erythema and some mild oozing from the access sheath and I decided to leave a stent.  Fluoroscopic images of the left side noted there to  be some contrast hanging up proximal to the stone impaction site.  Therefore the right wire was backloaded on the cystoscope and a 624 stent was passed.  The wire was removed with a good coil seen in the kidney and a good coil in the bladder.  The sensor wire was advanced back up the left side and a 6 24 stent advanced.  Again a good coil was noted up in the kidney and a good coil in the bladder.  I left strings on both of the stents.  She was awakened and taken recovery room in stable condition.  Complications: None  Blood loss: Minimal  Specimens: Stone fragments to patient/office lab  Drains: Bilateral 6 x 24 cm ureteral stents with strings-I will have her remove these in 6 days, Monday, March 27, 2018  Disposition: Patient stable to PACU

## 2018-03-21 NOTE — Anesthesia Procedure Notes (Addendum)
Procedure Name: Intubation Date/Time: 03/21/2018 7:57 AM Performed by: Wanita Chamberlain, CRNA Pre-anesthesia Checklist: Patient identified, Emergency Drugs available, Suction available and Patient being monitored Patient Re-evaluated:Patient Re-evaluated prior to induction Oxygen Delivery Method: Circle system utilized Preoxygenation: Pre-oxygenation with 100% oxygen Induction Type: IV induction Ventilation: Mask ventilation without difficulty Laryngoscope Size: Glidescope Tube size: 7.5 mm Number of attempts: 1 Airway Equipment and Method: Rigid stylet Placement Confirmation: breath sounds checked- equal and bilateral,  CO2 detector,  positive ETCO2 and ETT inserted through vocal cords under direct vision Secured at: 23 cm Tube secured with: Tape Dental Injury: Teeth and Oropharynx as per pre-operative assessment  Difficulty Due To: Difficulty was anticipated and Difficult Airway- due to reduced neck mobility Comments: Elective Glidescope intubation. Pt w/ Grade IV view pre-op

## 2018-03-21 NOTE — Interval H&P Note (Signed)
History and Physical Interval Note:  03/21/2018 7:34 AM  [name]  has presented today for surgery, with the diagnosis of RIGHT RENAL STONE, LEFT URETERAL STONE  The various methods of treatment have been discussed with the patient and family. After consideration of risks, benefits and other options for treatment, the patient has consented to  Procedure(s) with comments: CYSTOSCOPY/URETEROSCOPY/HOLMIUM LASER/STENT PLACEMENT (Bilateral) - ONLY NEEDS 60 MIN as a surgical intervention .  The patient's history has been reviewed, patient examined, no change in status, stable for surgery. CXR, urine and blood cultures negative over the weekend. Still has cough. AFVSS. She has SP pressure and pain with voiding since the stents placed. Discussed the stones likely aren't related to her recurrent UTI and to cont TV estrogen and restart d-mannose.  I have reviewed the patient's chart and labs.  Questions were answered to the patient's and family's satisfaction.     [name]

## 2018-03-22 ENCOUNTER — Telehealth: Payer: Self-pay | Admitting: Family Medicine

## 2018-03-22 ENCOUNTER — Encounter (HOSPITAL_BASED_OUTPATIENT_CLINIC_OR_DEPARTMENT_OTHER): Payer: Self-pay | Admitting: Urology

## 2018-03-22 DIAGNOSIS — Z96652 Presence of left artificial knee joint: Secondary | ICD-10-CM | POA: Diagnosis not present

## 2018-03-22 DIAGNOSIS — N390 Urinary tract infection, site not specified: Secondary | ICD-10-CM | POA: Diagnosis not present

## 2018-03-22 DIAGNOSIS — E119 Type 2 diabetes mellitus without complications: Secondary | ICD-10-CM | POA: Diagnosis not present

## 2018-03-22 DIAGNOSIS — D638 Anemia in other chronic diseases classified elsewhere: Secondary | ICD-10-CM | POA: Diagnosis not present

## 2018-03-22 DIAGNOSIS — I10 Essential (primary) hypertension: Secondary | ICD-10-CM | POA: Diagnosis not present

## 2018-03-22 DIAGNOSIS — F0390 Unspecified dementia without behavioral disturbance: Secondary | ICD-10-CM | POA: Diagnosis not present

## 2018-03-22 DIAGNOSIS — Z960 Presence of urogenital implants: Secondary | ICD-10-CM | POA: Diagnosis not present

## 2018-03-22 DIAGNOSIS — Z86711 Personal history of pulmonary embolism: Secondary | ICD-10-CM | POA: Diagnosis not present

## 2018-03-22 DIAGNOSIS — I480 Paroxysmal atrial fibrillation: Secondary | ICD-10-CM | POA: Diagnosis not present

## 2018-03-22 LAB — CULTURE, BLOOD (ROUTINE X 2)
Culture: NO GROWTH
Culture: NO GROWTH

## 2018-03-22 NOTE — Telephone Encounter (Signed)
Please call to Approve  [phone]  Occupational therapy   2 x 1 3 x 1 2 x 2

## 2018-03-22 NOTE — Anesthesia Postprocedure Evaluation (Signed)
Anesthesia Post Note  Patient: [name]  Procedure(s) Performed: CYSTOSCOPY/URETEROSCOPY/HOLMIUM LASER/STENT PLACEMENT (Bilateral Ureter)     Patient location during evaluation: PACU Anesthesia Type: General Level of consciousness: awake and alert Pain management: pain level controlled Vital Signs Assessment: post-procedure vital signs reviewed and stable Respiratory status: spontaneous breathing, nonlabored ventilation, respiratory function stable and patient connected to nasal cannula oxygen Cardiovascular status: blood pressure returned to baseline and stable Postop Assessment: no apparent nausea or vomiting Anesthetic complications: no    Last Vitals:  Vitals:   03/21/18 1000 03/21/18 1104  BP:  132/63  Pulse: 76 76  Resp: (!) 24 15  Temp:  36.7 C  SpO2: 94% 96%    Last Pain:  Vitals:   03/21/18 1104  TempSrc:   PainSc: 0-No pain                 Corita Allinson L Corina Stacy

## 2018-03-23 NOTE — Telephone Encounter (Signed)
Spoke with Tressia Danas and gave verbal for PT. He will fax over paper work to be signed.

## 2018-03-24 DIAGNOSIS — Z86711 Personal history of pulmonary embolism: Secondary | ICD-10-CM | POA: Diagnosis not present

## 2018-03-24 DIAGNOSIS — Z96652 Presence of left artificial knee joint: Secondary | ICD-10-CM | POA: Diagnosis not present

## 2018-03-24 DIAGNOSIS — N390 Urinary tract infection, site not specified: Secondary | ICD-10-CM | POA: Diagnosis not present

## 2018-03-24 DIAGNOSIS — I480 Paroxysmal atrial fibrillation: Secondary | ICD-10-CM | POA: Diagnosis not present

## 2018-03-24 DIAGNOSIS — D638 Anemia in other chronic diseases classified elsewhere: Secondary | ICD-10-CM | POA: Diagnosis not present

## 2018-03-24 DIAGNOSIS — I10 Essential (primary) hypertension: Secondary | ICD-10-CM | POA: Diagnosis not present

## 2018-03-24 DIAGNOSIS — E119 Type 2 diabetes mellitus without complications: Secondary | ICD-10-CM | POA: Diagnosis not present

## 2018-03-24 DIAGNOSIS — Z960 Presence of urogenital implants: Secondary | ICD-10-CM | POA: Diagnosis not present

## 2018-03-24 DIAGNOSIS — F0390 Unspecified dementia without behavioral disturbance: Secondary | ICD-10-CM | POA: Diagnosis not present

## 2018-03-24 NOTE — Discharge Summary (Signed)
Physician Discharge Summary  MARCHA LICKLIDER CVE:[id number] DOB: 01-29-1941 DOA: 03/17/2018  PCP: [name], MD  Admit date: 03/17/2018 Discharge date: 03/20/2018  Admitted From: Home.  Disposition: Home.   Recommendations for Outpatient Follow-up:  1. Follow up with PCP in 1-2 weeks 2. Please obtain BMP/CBC in one week 3. Please follow up with urology as recommended.   Home Health:YES   Discharge Condition:stable.  CODE STATUS: full code.  Diet recommendation: Heart Healthy  Brief/Interim Summary: [name] a 78 y.o.femalewith medical history significant ofiron deficiency anemia, atrial fibrillation on pradaxa, dementia, DM, pulmonary embolism in the past, migraines, h/o ureteral stones recently admitted for hydronephrosis and underwentCystoscopy, bilateral retrograde pyelogram, bilateral ureteral stent placement; Bladder biopsy fulguration 0.5 to 2 cmby Dr Junious Silk and discharged on keflex presents with urinary symptoms, fevers and flank pain since 2 to 3 days and associated sob, wheezing and coughing.    Discharge Diagnoses:  Active Problems:   Essential hypertension   Gastroesophageal reflux disease   Diabetes mellitus, insulin dependent (IDDM), uncontrolled (HCC)   History of pulmonary embolism   Hydronephrosis with renal and ureteral calculus obstruction   UTI (urinary tract infection)  Complicated UTI with H/o recent hydronephrosis:  S/p Cystoscopy, bilateral retrograde pyelogram, bilateral ureteral stent placement; Bladder biopsy fulguration 0.5 to 2 cm, last admission and her CT this admission does not show any residual hydronephrosis Completed 3 days of IV cefepime. . Patient urine cultures are negative so far. blood cultres have been negative so far.  Urology consulted, recommended oral antibiotics ondischarge and plan for stent removal the next day.      Paroxysmal atrial fibrillation: Rate controlled.     Pulmonary embolism: Resume  pradaxa.    Hypertension:  Better controlled today.    Sob, cough and wheezing Possibly acute bronchitis. Much improved.   Continue with duonebs.  CXR does not show pneumonia.    Anemia of chronic disease: Transfuse to keep hemoglobin greater than 7. Hemoglobin is stable at 7.8.     AKI:  Improved, creatinine is 1.09.    Discharge Instructions  Discharge Instructions    Diet - low sodium heart healthy   Complete by:  As directed    Discharge instructions   Complete by:  As directed    Please follow up with Urology as recommended tomorrow to get the stent removal.      Follow-up Information    [name], MD. Schedule an appointment as soon as possible for a visit in 1 week(s).   Specialty:  Family Medicine Contact information: [address] [place] [address] [phone]        Arnoldo Lenis, MD .   Specialty:  Cardiology Contact information: [address] [place] [address] [place] Follow up.   Specialty:  Home Health Services Why:  [facility] nursing/physical/occupational therapy/aide/social worker Contact information: [address] [place] [address] [phone]            Urology Dr Dorina Hoyer.    Procedures/Studies: Dg Chest 2 View  Result Date: 03/17/2018 CLINICAL DATA:  Cough, fever x couple days, recently had kidney stents placed cough, fever EXAM: CHEST - 2 VIEW COMPARISON:  Chest radiograph 02/25/2018, CT 01/24/2018 FINDINGS: Stable cardiac silhouette. There is increased RIGHT perihilar / suprahilar density compared to recent radiograph with a sharp arc like border. Favor this representing combination of the SVC and ectatic ascending aorta.  Central venous congestion noted. No pulmonary edema. No pleural fluid. IMPRESSION: 1. Extra shadow along the RIGHT mediastinum is favored benign vascular shadow. 2. Central venous congestion. Electronically  Signed   By: Suzy Bouchard M.D.   On: 03/17/2018 15:37   Dg Chest 2 View  Result Date: 02/25/2018 CLINICAL DATA:  Cough and fever EXAM: CHEST - 2 VIEW COMPARISON:  01/24/2018, CT 01/24/2018 FINDINGS: Borderline to mild cardiomegaly with aortic atherosclerosis. No acute consolidation or effusion. No pneumothorax. IMPRESSION: No active cardiopulmonary disease.  Borderline to mild cardiomegaly. Electronically Signed   By: Donavan Foil M.D.   On: 02/25/2018 00:21   Ct Abdomen Pelvis W Contrast  Result Date: 03/17/2018 CLINICAL DATA:  Left flank pain. Dysuria. Bilateral ureteral stents. EXAM: CT ABDOMEN AND PELVIS WITH CONTRAST TECHNIQUE: Multidetector CT imaging of the abdomen and pelvis was performed using the standard protocol following bolus administration of intravenous contrast. CONTRAST:  24mL OMNIPAQUE IOHEXOL 300 MG/ML  SOLN COMPARISON:  CT scan dated 02/25/2018 FINDINGS: Lower chest: No acute abnormality. Hepatobiliary: No focal liver abnormality is seen. No gallstones, gallbladder wall thickening, or biliary dilatation. Pancreas: Diffuse atrophy of the pancreas. No mass lesions. No ductal dilatation. Spleen: Normal in size without focal abnormality. Adrenals/Urinary Tract: Adrenal glands are normal. Bilateral ureteral stents are in place. Stable bilateral renal cysts. No hydronephrosis. Bladder appears normal except for the presence of the stents. Stomach/Bowel: Stomach is within normal limits. Appendix appears normal. No evidence of bowel wall thickening, distention, or inflammatory changes. Vascular/Lymphatic: No significant vascular findings are present. No enlarged abdominal or pelvic lymph nodes. Reproductive: Uterus and bilateral adnexa are unremarkable. Other: No abdominal wall hernia or abnormality. No abdominopelvic ascites. 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 Broad-based disc protrusion. Gas is herniated into the right  neural foramen. Severe spinal stenosis at that level, unchanged. IMPRESSION: No acute abnormalities. Bilateral ureteral stents in place with no residual hydronephrosis. 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 Electronically Signed   By: Lorriane Shire M.D.   On: 03/17/2018 17:07   Ct Abdomen Pelvis W Contrast  Result Date: 02/25/2018 CLINICAL DATA:  78 y/o  F; hematuria, dysuria, fever. EXAM: CT ABDOMEN AND PELVIS WITH CONTRAST TECHNIQUE: Multidetector CT imaging of the abdomen and pelvis was performed using the standard protocol following bolus administration of intravenous contrast. CONTRAST:  10mL ISOVUE-300 IOPAMIDOL (ISOVUE-300) INJECTION 61% COMPARISON:  07/17/2010 CT abdomen and pelvis FINDINGS: Lower chest: No acute abnormality. Hepatobiliary: No focal liver abnormality is seen. No gallstones, gallbladder wall thickening, or biliary dilatation. Pancreas: Unremarkable. No pancreatic ductal dilatation or surrounding inflammatory changes. Spleen: Normal in size without focal abnormality. Adrenals/Urinary Tract: Normal adrenal glands. 4 mm right kidney lower pole stone. Multiple bilateral renal cysts measuring up to 19 mm in the upper pole of left kidney. No right hydronephrosis. Mild left hydronephrosis with 3 mm stone in the distal left ureter in left hemipelvis (series 5, image 82). Normal bladder. Stomach/Bowel: Stomach is within normal limits. Appendix appears normal. No evidence of bowel wall thickening, distention, or inflammatory changes. Mild sigmoid diverticulosis. Vascular/Lymphatic: No significant vascular findings are present. No enlarged abdominal or pelvic lymph nodes. Reproductive: Uterus and bilateral adnexa are unremarkable. Other: No abdominal wall hernia or abnormality. No abdominopelvic ascites. Musculoskeletal: Lumbar spondylosis with advanced facet arthropathy greatest at L4-5 where there is grade 1 anterolisthesis. No acute  osseous abnormality identified. IMPRESSION: 1. Mild left hydronephrosis with 3 mm  stone in distal left ureter in left hemipelvis. 2. Right kidney lower pole 4 mm nonobstructing stone. 3. Mild sigmoid diverticulosis. 4. Lumbar spondylosis with advanced facet arthropathy greatest at L4-5 where there is grade 1 anterolisthesis. Electronically Signed   By: Kristine Garbe M.D.   On: 02/25/2018 00:43   Dg C-arm 1-60 Min-no Report  Result Date: 02/25/2018 Fluoroscopy was utilized by the requesting physician.  No radiographic interpretation.     Subjective: No new complaints.   Discharge Exam: Vitals:   03/19/18 2109 03/20/18 0607  BP: 132/66 (!) 149/77  Pulse: 79 81  Resp: 18 18  Temp: 98.1 F (36.7 C) 98.4 F (36.9 C)  SpO2: 97% 95%   Vitals:   03/19/18 0843 03/19/18 1345 03/19/18 2109 03/20/18 0607  BP:  (!) 144/66 132/66 (!) 149/77  Pulse:  82 79 81  Resp:  17 18 18   Temp:  97.8 F (36.6 C) 98.1 F (36.7 C) 98.4 F (36.9 C)  TempSrc:  Oral Oral Oral  SpO2: 96% 95% 97% 95%  Weight:      Height:        General: Pt is alert, awake, not in acute distress Cardiovascular: RRR, S1/S2 +, no rubs, no gallops Respiratory: CTA bilaterally, no wheezing, no rhonchi Abdominal: Soft, NT, ND, bowel sounds + Extremities: no edema, no cyanosis    The results of significant diagnostics from this hospitalization (including imaging, microbiology, ancillary and laboratory) are listed below for reference.     Microbiology: Recent Results (from the past 240 hour(s))  Blood culture (routine x 2)     Status: None   Collection Time: 03/17/18  1:27 PM  Result Value Ref Range Status   Specimen Description   Final    BLOOD LEFT ANTECUBITAL Performed at [facility] [address], [place], [place] [address]    Special Requests   Final    BOTTLES DRAWN AEROBIC AND ANAEROBIC Blood Culture results may not be optimal due to an excessive volume of blood received in  culture bottles Performed at [facility] [address] [place], [place] [address]    Culture   Final    NO GROWTH 5 DAYS Performed at [facility], [place] [address], [place], [place] [address]    Report Status 03/22/2018 FINAL  Final  Blood culture (routine x 2)     Status: None   Collection Time: 03/17/18  1:32 PM  Result Value Ref Range Status   Specimen Description   Final    BLOOD RIGHT ANTECUBITAL Performed at [facility] [address], [place], [place] [address]    Special Requests   Final    BOTTLES DRAWN AEROBIC AND ANAEROBIC Blood Culture results may not be optimal due to an excessive volume of blood received in culture bottles Performed at [facility] [address] [place], [place] [address]    Culture   Final    NO GROWTH 5 DAYS Performed at [facility], [place] [address], [place], [place] [address]    Report Status 03/22/2018 FINAL  Final  Urine culture     Status: None   Collection Time: 03/17/18  2:47 PM  Result Value Ref Range Status   Specimen Description   Final    URINE, CLEAN CATCH Performed at [facility], [place] [address], [place],  [address]    Special Requests   Final    NONE Performed at [facility], [place] [address], [place], [place] [address]    Culture   Final    NO GROWTH Performed at [facility], [place] [address], [place], [place] [address]    Report Status 03/18/2018 FINAL  Final     Labs: BNP (last 3 results) Recent Labs    01/24/18 2035  BNP 01.7   Basic Metabolic Panel: Recent Labs  Lab 03/18/18 1134 03/19/18 0507  NA 133* 135  K 3.6 4.0  CL 105 107  CO2 20* 20*  GLUCOSE 186* 158*  BUN 19 18  CREATININE 1.11* 1.09*  CALCIUM 8.3* 8.5*   Liver Function Tests: No results for input(s): AST, ALT, ALKPHOS, BILITOT, PROT, ALBUMIN in the last 168 hours. No results for input(s): LIPASE, AMYLASE in the last  168 hours. No results for input(s): AMMONIA in the last 168 hours. CBC: Recent Labs  Lab 03/19/18 0507  WBC 6.0  HGB 7.8*  HCT 27.0*  MCV 81.6  PLT 375   Cardiac Enzymes: No results for input(s): CKTOTAL, CKMB, CKMBINDEX, TROPONINI in the last 168 hours. BNP: Invalid input(s): POCBNP CBG: Recent Labs  Lab 03/19/18 2117 03/20/18 0726 03/20/18 1133 03/21/18 0719 03/21/18 0920  GLUCAP 112* 98 190* 120* 130*   D-Dimer No results for input(s): DDIMER in the last 72 hours. Hgb A1c No results for input(s): HGBA1C in the last 72 hours. Lipid Profile No results for input(s): CHOL, HDL, LDLCALC, TRIG, CHOLHDL, LDLDIRECT in the last 72 hours. Thyroid function studies No results for input(s): TSH, T4TOTAL, T3FREE, THYROIDAB in the last 72 hours.  Invalid input(s): FREET3 Anemia work up No results for input(s): VITAMINB12, FOLATE, FERRITIN, TIBC, IRON, RETICCTPCT in the last 72 hours. Urinalysis    Component Value Date/Time   COLORURINE YELLOW 03/17/2018 1447   APPEARANCEUR CLOUDY (A) 03/17/2018 1447   LABSPEC 1.012 03/17/2018 1447   PHURINE 6.0 03/17/2018 1447   GLUCOSEU NEGATIVE 03/17/2018 1447   HGBUR LARGE (A) 03/17/2018 1447   HGBUR negative 01/06/2010 0929   BILIRUBINUR NEGATIVE 03/17/2018 1447   BILIRUBINUR moderate 11/17/2016 1438   KETONESUR NEGATIVE 03/17/2018 1447   PROTEINUR 100 (A) 03/17/2018 1447   UROBILINOGEN >=8.0 (A) 11/17/2016 1438   UROBILINOGEN 0.2 10/12/2011 1556   NITRITE NEGATIVE 03/17/2018 1447   LEUKOCYTESUR LARGE (A) 03/17/2018 1447   Sepsis Labs Invalid input(s): PROCALCITONIN,  WBC,  LACTICIDVEN Microbiology Recent Results (from the past 240 hour(s))  Blood culture (routine x 2)     Status: None   Collection Time: 03/17/18  1:27 PM  Result Value Ref Range Status   Specimen Description   Final    BLOOD LEFT ANTECUBITAL Performed at [facility] [address] [place], [place] [address]    Special Requests   Final     BOTTLES DRAWN AEROBIC AND ANAEROBIC Blood Culture results may not be optimal due to an excessive volume of blood received in culture bottles Performed at [facility] [address] [place], [place] [address]    Culture   Final    NO GROWTH 5 DAYS Performed at [facility], [place] [address] [place], [place] [address]    Report Status 03/22/2018 FINAL  Final  Blood culture (routine x 2)     Status: None   Collection Time: 03/17/18  1:32 PM  Result Value Ref Range Status   Specimen Description   Final    BLOOD RIGHT ANTECUBITAL Performed at [facility] [address]  [place], [place] [address]    Special Requests   Final    BOTTLES DRAWN AEROBIC AND ANAEROBIC Blood Culture results may not be optimal due to an excessive volume of blood received in culture bottles Performed at [facility] [address], [place], [place] [address]    Culture   Final    NO GROWTH 5 DAYS Performed at [facility], [place] [address], [place], [place] [address]    Report Status 03/22/2018 FINAL  Final  Urine culture     Status: None   Collection Time: 03/17/18  2:47 PM  Result Value Ref Range Status   Specimen Description   Final    URINE, CLEAN CATCH Performed at [facility], [place] [address] [place], [place] [address]    Special Requests   Final    NONE Performed at [facility], [place] [address] [place], [place] [address]    Culture   Final    NO GROWTH Performed at [facility], [place] [address] [place], [place] [address]    Report Status 03/18/2018 FINAL  Final     Time coordinating discharge: 32 minutes  SIGNED:   Hosie Poisson, MD  [facility] 03/24/2018, 5:34 PM Pager   If 7PM-7AM, please contact night-coverage [email or web address] Password [email or web address]

## 2018-03-27 ENCOUNTER — Other Ambulatory Visit: Payer: Self-pay | Admitting: Pharmacy Technician

## 2018-03-27 DIAGNOSIS — F0390 Unspecified dementia without behavioral disturbance: Secondary | ICD-10-CM | POA: Diagnosis not present

## 2018-03-27 DIAGNOSIS — I480 Paroxysmal atrial fibrillation: Secondary | ICD-10-CM | POA: Diagnosis not present

## 2018-03-27 DIAGNOSIS — N390 Urinary tract infection, site not specified: Secondary | ICD-10-CM | POA: Diagnosis not present

## 2018-03-27 DIAGNOSIS — Z86711 Personal history of pulmonary embolism: Secondary | ICD-10-CM | POA: Diagnosis not present

## 2018-03-27 DIAGNOSIS — Z96652 Presence of left artificial knee joint: Secondary | ICD-10-CM | POA: Diagnosis not present

## 2018-03-27 DIAGNOSIS — Z960 Presence of urogenital implants: Secondary | ICD-10-CM | POA: Diagnosis not present

## 2018-03-27 DIAGNOSIS — I10 Essential (primary) hypertension: Secondary | ICD-10-CM | POA: Diagnosis not present

## 2018-03-27 DIAGNOSIS — D638 Anemia in other chronic diseases classified elsewhere: Secondary | ICD-10-CM | POA: Diagnosis not present

## 2018-03-27 DIAGNOSIS — E119 Type 2 diabetes mellitus without complications: Secondary | ICD-10-CM | POA: Diagnosis not present

## 2018-03-27 NOTE — Patient Outreach (Signed)
[facility]) Care Management  03/27/2018  [name] [birth year] [id number]    Successful call placed to patients daughter, Pam regarding patient assistance application(s) for Pradaxa , HIPAA identifiers verified. Pam states that her husband checks the mail, so it is possible that she has received. She states that they are still interested in applying and she will contact me and let me know if she has received it.  Will follow up with Pam in 3-5 business days if I have not received a call back from her.  Maud Deed Chana Bode [place] Certified Pharmacy Technician [place] [employer] Management Direct [phone]

## 2018-03-28 DIAGNOSIS — Z96652 Presence of left artificial knee joint: Secondary | ICD-10-CM | POA: Diagnosis not present

## 2018-03-28 DIAGNOSIS — D638 Anemia in other chronic diseases classified elsewhere: Secondary | ICD-10-CM | POA: Diagnosis not present

## 2018-03-28 DIAGNOSIS — I10 Essential (primary) hypertension: Secondary | ICD-10-CM | POA: Diagnosis not present

## 2018-03-28 DIAGNOSIS — N390 Urinary tract infection, site not specified: Secondary | ICD-10-CM | POA: Diagnosis not present

## 2018-03-28 DIAGNOSIS — Z960 Presence of urogenital implants: Secondary | ICD-10-CM | POA: Diagnosis not present

## 2018-03-28 DIAGNOSIS — Z86711 Personal history of pulmonary embolism: Secondary | ICD-10-CM | POA: Diagnosis not present

## 2018-03-28 DIAGNOSIS — I480 Paroxysmal atrial fibrillation: Secondary | ICD-10-CM | POA: Diagnosis not present

## 2018-03-28 DIAGNOSIS — F0390 Unspecified dementia without behavioral disturbance: Secondary | ICD-10-CM | POA: Diagnosis not present

## 2018-03-28 DIAGNOSIS — E119 Type 2 diabetes mellitus without complications: Secondary | ICD-10-CM | POA: Diagnosis not present

## 2018-03-29 ENCOUNTER — Telehealth: Payer: Self-pay | Admitting: Family Medicine

## 2018-03-29 DIAGNOSIS — N390 Urinary tract infection, site not specified: Secondary | ICD-10-CM | POA: Diagnosis not present

## 2018-03-29 DIAGNOSIS — Z86711 Personal history of pulmonary embolism: Secondary | ICD-10-CM | POA: Diagnosis not present

## 2018-03-29 DIAGNOSIS — D638 Anemia in other chronic diseases classified elsewhere: Secondary | ICD-10-CM | POA: Diagnosis not present

## 2018-03-29 DIAGNOSIS — Z960 Presence of urogenital implants: Secondary | ICD-10-CM | POA: Diagnosis not present

## 2018-03-29 DIAGNOSIS — F0390 Unspecified dementia without behavioral disturbance: Secondary | ICD-10-CM | POA: Diagnosis not present

## 2018-03-29 DIAGNOSIS — Z96652 Presence of left artificial knee joint: Secondary | ICD-10-CM | POA: Diagnosis not present

## 2018-03-29 DIAGNOSIS — I480 Paroxysmal atrial fibrillation: Secondary | ICD-10-CM | POA: Diagnosis not present

## 2018-03-29 DIAGNOSIS — I10 Essential (primary) hypertension: Secondary | ICD-10-CM | POA: Diagnosis not present

## 2018-03-29 DIAGNOSIS — E119 Type 2 diabetes mellitus without complications: Secondary | ICD-10-CM | POA: Diagnosis not present

## 2018-03-29 NOTE — Telephone Encounter (Signed)
[name] verbal ok to use previous order to go in the home and discuss options for social worker for patient.

## 2018-03-29 NOTE — Telephone Encounter (Signed)
Noted and agree, thank you!

## 2018-03-29 NOTE — Telephone Encounter (Signed)
Returned Tracy's call. No answer. Left message requesting call back.

## 2018-03-29 NOTE — Telephone Encounter (Signed)
Please call [name] regarding orders

## 2018-03-30 DIAGNOSIS — D638 Anemia in other chronic diseases classified elsewhere: Secondary | ICD-10-CM | POA: Diagnosis not present

## 2018-03-30 DIAGNOSIS — Z960 Presence of urogenital implants: Secondary | ICD-10-CM | POA: Diagnosis not present

## 2018-03-30 DIAGNOSIS — E119 Type 2 diabetes mellitus without complications: Secondary | ICD-10-CM | POA: Diagnosis not present

## 2018-03-30 DIAGNOSIS — F0390 Unspecified dementia without behavioral disturbance: Secondary | ICD-10-CM | POA: Diagnosis not present

## 2018-03-30 DIAGNOSIS — I10 Essential (primary) hypertension: Secondary | ICD-10-CM | POA: Diagnosis not present

## 2018-03-30 DIAGNOSIS — N390 Urinary tract infection, site not specified: Secondary | ICD-10-CM | POA: Diagnosis not present

## 2018-03-30 DIAGNOSIS — I480 Paroxysmal atrial fibrillation: Secondary | ICD-10-CM | POA: Diagnosis not present

## 2018-03-30 DIAGNOSIS — Z96652 Presence of left artificial knee joint: Secondary | ICD-10-CM | POA: Diagnosis not present

## 2018-03-30 DIAGNOSIS — Z86711 Personal history of pulmonary embolism: Secondary | ICD-10-CM | POA: Diagnosis not present

## 2018-03-31 DIAGNOSIS — F0390 Unspecified dementia without behavioral disturbance: Secondary | ICD-10-CM | POA: Diagnosis not present

## 2018-03-31 DIAGNOSIS — D638 Anemia in other chronic diseases classified elsewhere: Secondary | ICD-10-CM | POA: Diagnosis not present

## 2018-03-31 DIAGNOSIS — E119 Type 2 diabetes mellitus without complications: Secondary | ICD-10-CM | POA: Diagnosis not present

## 2018-03-31 DIAGNOSIS — Z86711 Personal history of pulmonary embolism: Secondary | ICD-10-CM | POA: Diagnosis not present

## 2018-03-31 DIAGNOSIS — Z96652 Presence of left artificial knee joint: Secondary | ICD-10-CM | POA: Diagnosis not present

## 2018-03-31 DIAGNOSIS — I10 Essential (primary) hypertension: Secondary | ICD-10-CM | POA: Diagnosis not present

## 2018-03-31 DIAGNOSIS — N390 Urinary tract infection, site not specified: Secondary | ICD-10-CM | POA: Diagnosis not present

## 2018-03-31 DIAGNOSIS — Z960 Presence of urogenital implants: Secondary | ICD-10-CM | POA: Diagnosis not present

## 2018-03-31 DIAGNOSIS — I480 Paroxysmal atrial fibrillation: Secondary | ICD-10-CM | POA: Diagnosis not present

## 2018-04-03 ENCOUNTER — Telehealth: Payer: Self-pay | Admitting: Family Medicine

## 2018-04-03 DIAGNOSIS — Z960 Presence of urogenital implants: Secondary | ICD-10-CM | POA: Diagnosis not present

## 2018-04-03 DIAGNOSIS — Z96652 Presence of left artificial knee joint: Secondary | ICD-10-CM | POA: Diagnosis not present

## 2018-04-03 DIAGNOSIS — Z86711 Personal history of pulmonary embolism: Secondary | ICD-10-CM | POA: Diagnosis not present

## 2018-04-03 DIAGNOSIS — E119 Type 2 diabetes mellitus without complications: Secondary | ICD-10-CM | POA: Diagnosis not present

## 2018-04-03 DIAGNOSIS — F0390 Unspecified dementia without behavioral disturbance: Secondary | ICD-10-CM | POA: Diagnosis not present

## 2018-04-03 DIAGNOSIS — D638 Anemia in other chronic diseases classified elsewhere: Secondary | ICD-10-CM | POA: Diagnosis not present

## 2018-04-03 DIAGNOSIS — N390 Urinary tract infection, site not specified: Secondary | ICD-10-CM | POA: Diagnosis not present

## 2018-04-03 DIAGNOSIS — I480 Paroxysmal atrial fibrillation: Secondary | ICD-10-CM | POA: Diagnosis not present

## 2018-04-03 DIAGNOSIS — I10 Essential (primary) hypertension: Secondary | ICD-10-CM | POA: Diagnosis not present

## 2018-04-03 NOTE — Telephone Encounter (Signed)
Cat LVM that she went out to the Patients home, and PT refused services.

## 2018-04-03 NOTE — Telephone Encounter (Signed)
Left message for [name] [facility]) to please call the office back to discuss to try and find out why services were denied.

## 2018-04-04 DIAGNOSIS — E119 Type 2 diabetes mellitus without complications: Secondary | ICD-10-CM | POA: Diagnosis not present

## 2018-04-04 DIAGNOSIS — Z96652 Presence of left artificial knee joint: Secondary | ICD-10-CM | POA: Diagnosis not present

## 2018-04-04 DIAGNOSIS — D638 Anemia in other chronic diseases classified elsewhere: Secondary | ICD-10-CM | POA: Diagnosis not present

## 2018-04-04 DIAGNOSIS — F0390 Unspecified dementia without behavioral disturbance: Secondary | ICD-10-CM | POA: Diagnosis not present

## 2018-04-04 DIAGNOSIS — Z960 Presence of urogenital implants: Secondary | ICD-10-CM | POA: Diagnosis not present

## 2018-04-04 DIAGNOSIS — N390 Urinary tract infection, site not specified: Secondary | ICD-10-CM | POA: Diagnosis not present

## 2018-04-04 DIAGNOSIS — Z86711 Personal history of pulmonary embolism: Secondary | ICD-10-CM | POA: Diagnosis not present

## 2018-04-04 DIAGNOSIS — I10 Essential (primary) hypertension: Secondary | ICD-10-CM | POA: Diagnosis not present

## 2018-04-04 DIAGNOSIS — I480 Paroxysmal atrial fibrillation: Secondary | ICD-10-CM | POA: Diagnosis not present

## 2018-04-04 NOTE — Telephone Encounter (Signed)
Spoke with patient's sister and she stated she didn't know why patient had refused services. A social worker was supposed to come out today and she was going to be there for that. She sometimes gets like that. They have an appointment Thursday to see Dr.Simpson and maybe they could discuss it then.

## 2018-04-04 NOTE — Telephone Encounter (Signed)
noted 

## 2018-04-05 DIAGNOSIS — F0390 Unspecified dementia without behavioral disturbance: Secondary | ICD-10-CM | POA: Diagnosis not present

## 2018-04-05 DIAGNOSIS — Z794 Long term (current) use of insulin: Secondary | ICD-10-CM

## 2018-04-05 DIAGNOSIS — I10 Essential (primary) hypertension: Secondary | ICD-10-CM

## 2018-04-05 DIAGNOSIS — Z96652 Presence of left artificial knee joint: Secondary | ICD-10-CM

## 2018-04-05 DIAGNOSIS — I480 Paroxysmal atrial fibrillation: Secondary | ICD-10-CM | POA: Diagnosis not present

## 2018-04-05 DIAGNOSIS — E119 Type 2 diabetes mellitus without complications: Secondary | ICD-10-CM | POA: Diagnosis not present

## 2018-04-05 DIAGNOSIS — Z86711 Personal history of pulmonary embolism: Secondary | ICD-10-CM

## 2018-04-05 DIAGNOSIS — D638 Anemia in other chronic diseases classified elsewhere: Secondary | ICD-10-CM | POA: Diagnosis not present

## 2018-04-05 DIAGNOSIS — N390 Urinary tract infection, site not specified: Secondary | ICD-10-CM | POA: Diagnosis not present

## 2018-04-05 DIAGNOSIS — Z960 Presence of urogenital implants: Secondary | ICD-10-CM

## 2018-04-05 DIAGNOSIS — Z79899 Other long term (current) drug therapy: Secondary | ICD-10-CM

## 2018-04-06 ENCOUNTER — Ambulatory Visit: Payer: Self-pay | Admitting: Family Medicine

## 2018-04-10 ENCOUNTER — Ambulatory Visit (INDEPENDENT_AMBULATORY_CARE_PROVIDER_SITE_OTHER): Payer: Medicare HMO | Admitting: Family Medicine

## 2018-04-10 ENCOUNTER — Encounter: Payer: Self-pay | Admitting: Family Medicine

## 2018-04-10 ENCOUNTER — Other Ambulatory Visit: Payer: Self-pay | Admitting: Family Medicine

## 2018-04-10 VITALS — BP 132/80 | HR 86 | Resp 14 | Ht 66.0 in | Wt 231.0 lb

## 2018-04-10 DIAGNOSIS — G8929 Other chronic pain: Secondary | ICD-10-CM

## 2018-04-10 DIAGNOSIS — I10 Essential (primary) hypertension: Secondary | ICD-10-CM | POA: Diagnosis not present

## 2018-04-10 DIAGNOSIS — R300 Dysuria: Secondary | ICD-10-CM

## 2018-04-10 DIAGNOSIS — Z794 Long term (current) use of insulin: Secondary | ICD-10-CM | POA: Diagnosis not present

## 2018-04-10 DIAGNOSIS — E785 Hyperlipidemia, unspecified: Secondary | ICD-10-CM

## 2018-04-10 DIAGNOSIS — E1165 Type 2 diabetes mellitus with hyperglycemia: Secondary | ICD-10-CM | POA: Diagnosis not present

## 2018-04-10 DIAGNOSIS — IMO0001 Reserved for inherently not codable concepts without codable children: Secondary | ICD-10-CM

## 2018-04-10 DIAGNOSIS — R3989 Other symptoms and signs involving the genitourinary system: Secondary | ICD-10-CM

## 2018-04-10 DIAGNOSIS — R52 Pain, unspecified: Secondary | ICD-10-CM

## 2018-04-10 MED ORDER — CLOTRIMAZOLE-BETAMETHASONE 1-0.05 % EX CREA: 1.0000 "application " | TOPICAL_CREAM | Freq: Two times a day (BID) | CUTANEOUS | 2 refills | Status: DC

## 2018-04-10 MED ORDER — TEMAZEPAM 30 MG PO CAPS: 30.0000 mg | ORAL_CAPSULE | Freq: Every evening | ORAL | 5 refills | Status: DC | PRN

## 2018-04-10 MED ORDER — OXYCODONE-ACETAMINOPHEN 10-325 MG PO TABS: ORAL_TABLET | ORAL | 0 refills | Status: AC

## 2018-04-10 MED ORDER — NYSTATIN 100000 UNIT/GM EX POWD: Freq: Every day | CUTANEOUS | 2 refills | Status: DC | PRN

## 2018-04-10 NOTE — Patient Instructions (Addendum)
F/U in 12 weeks, call if you need me before  Please get fasting lipid, cmp and eGFr, hBA1C, cBC1 week before next visit  Medication sent for sleep and chronic pain  Nurse will refill nystatin powder and clotrimazole cream for rash under breasts  Urine may be infected, we will be in touch once we have the results  Please call and let therapist know you do not want  Them back so they do not go out  Thankful stones are removed  Nurse will increase test supplies to three times daily

## 2018-04-10 NOTE — Progress Notes (Signed)
[name]     MRN: [id number]      DOB: [birth year]   HPI [name] is here for follow up and re-evaluation of chronic medical conditions, medication management and review of any available recent lab and radiology data.  Preventive health is updated, specifically  Cancer screening and Immunization.   Doing much better since she has had stones removed and she has less incontinence of urine  Has improved, since stent has been removed  ROS Denies recent fever or chills. Denies sinus pressure, nasal congestion, ear pain or sore throat. Denies chest congestion, productive cough or wheezing. Denies chest pains, palpitations and leg swelling Denies abdominal pain, nausea, vomiting,diarrhea or constipation.   Denies dysuria, frequency, hesitancy or incontinence. Denies joint pain, swelling and limitation in mobility. Denies headaches, seizures, numbness, or tingling. Denies depression, anxiety or insomnia. Denies skin break down or rash.   PE  BP 132/80   Pulse (!) 109   Resp 14   Ht 5\' 6"  (1.676 m)   Wt 231 lb (104.8 kg)   SpO2 96% Comment: room air  BMI 37.28 kg/m   Patient alert and oriented and in no cardiopulmonary distress.  HEENT: No facial asymmetry, EOMI,   oropharynx pink and moist.  Neck supple no JVD, no mass.  Chest: Clear to auscultation bilaterally.  CVS: S1, S2 no murmurs, no S3.Regular rate.  ABD: Soft non tender.   Ext: No edema  MS: Adequate ROM spine, shoulders, hips and knees.  Skin: Intact, no ulcerations or rash noted.  Psych: Good eye contact, normal affect. Memory intact not anxious or depressed appearing.  CNS: CN 2-12 intact, power,  normal throughout.no focal deficits noted.   Assessment & Plan  Essential hypertension Controlled, no change in medication DASH diet and commitment to daily physical activity for a minimum of 30 minutes discussed and encouraged, as a part of hypertension management. The importance of attaining a healthy  weight is also discussed.  BP/Weight 04/10/2018 03/21/2018 03/20/2018 03/17/2018 03/15/2018 02/27/2018 8/67/6195  Systolic BP 093 267 124 - 580 998 -  Diastolic BP 80 63 77 - 74 67 -  Wt. (Lbs) 231 245 - 232.59 234.04 - 234.8  BMI 37.28 39.54 - 37.54 37.78 - 36.77       Morbid obesity (HCC) Improved obesity linked with hypertension and diabetes Patient re-educated about  the importance of commitment to  exercise  as able.  The importance of healthy food choices with portion control discussed, as well as eating regularly and within a 12 hour window most days. The need to choose "clean , green" food 50 to 75% of the time is discussed, as well as to make water the primary drink and set a goal of 64 ounces water daily.  Encouraged to start a food diary,  and to consider  joining a support group. Sample diet sheets offered. Goals set by the patient for the next several months.   Weight /BMI 04/10/2018 03/21/2018 03/17/2018  WEIGHT 231 lb 245 lb 232 lb 9.4 oz  HEIGHT 5\' 6"  - 5\' 6"   BMI 37.28 kg/m2 39.54 kg/m2 37.54 kg/m2      Encounter for chronic pain management The patient's Controlled Substance registry is reviewed and compliance confirmed. Adequacy of  Pain control and level of function is assessed. Medication dosing is adjusted as deemed appropriate. Twelve weeks of medication is prescribed , patient signs for the script and is provided with a follow up appointment between 11 to 12 weeks .  Burning with urination C/o abnormal urine color and burning with urination No fever, chills or flank pain. CCUA abnormal , will send for c/s

## 2018-04-11 ENCOUNTER — Other Ambulatory Visit (HOSPITAL_COMMUNITY)
Admission: RE | Admit: 2018-04-11 | Discharge: 2018-04-11 | Disposition: A | Discharge status: Home / Self Care | Payer: Medicare HMO | Source: Ambulatory Visit | Attending: Family Medicine | Admitting: Family Medicine

## 2018-04-11 DIAGNOSIS — M1990 Unspecified osteoarthritis, unspecified site: Secondary | ICD-10-CM | POA: Diagnosis not present

## 2018-04-11 DIAGNOSIS — R3989 Other symptoms and signs involving the genitourinary system: Secondary | ICD-10-CM | POA: Insufficient documentation

## 2018-04-11 DIAGNOSIS — G894 Chronic pain syndrome: Secondary | ICD-10-CM | POA: Diagnosis not present

## 2018-04-11 DIAGNOSIS — R300 Dysuria: Secondary | ICD-10-CM | POA: Diagnosis not present

## 2018-04-11 DIAGNOSIS — Z96659 Presence of unspecified artificial knee joint: Secondary | ICD-10-CM | POA: Diagnosis not present

## 2018-04-11 LAB — URINALYSIS, COMPLETE (UACMP) WITH MICROSCOPIC
Bacteria, UA: NONE SEEN
Bilirubin Urine: NEGATIVE
Glucose, UA: NEGATIVE mg/dL
Ketones, ur: NEGATIVE mg/dL
Nitrite: NEGATIVE
Protein, ur: 30 mg/dL — AB
Specific Gravity, Urine: 1.013 (ref 1.005–1.030)
WBC, UA: >50 WBC/hpf — ABNORMAL HIGH (ref 0–5)
pH: 5 (ref 5.0–8.0)

## 2018-04-12 DIAGNOSIS — R300 Dysuria: Secondary | ICD-10-CM | POA: Insufficient documentation

## 2018-04-12 LAB — URINE CULTURE

## 2018-04-12 NOTE — Assessment & Plan Note (Addendum)
Improved obesity linked with hypertension and diabetes Patient re-educated about  the importance of commitment to  exercise  as able.  The importance of healthy food choices with portion control discussed, as well as eating regularly and within a 12 hour window most days. The need to choose "clean , green" food 50 to 75% of the time is discussed, as well as to make water the primary drink and set a goal of 64 ounces water daily.  Encouraged to start a food diary,  and to consider  joining a support group. Sample diet sheets offered. Goals set by the patient for the next several months.   Weight /BMI 04/10/2018 03/21/2018 03/17/2018  WEIGHT 231 lb 245 lb 232 lb 9.4 oz  HEIGHT 5\' 6"  - 5\' 6"   BMI 37.28 kg/m2 39.54 kg/m2 37.54 kg/m2

## 2018-04-12 NOTE — Assessment & Plan Note (Signed)
Controlled, no change in medication DASH diet and commitment to daily physical activity for a minimum of 30 minutes discussed and encouraged, as a part of hypertension management. The importance of attaining a healthy weight is also discussed.  BP/Weight 04/10/2018 03/21/2018 03/20/2018 03/17/2018 03/15/2018 02/27/2018 7/73/7366  Systolic BP 815 947 076 - 151 834 -  Diastolic BP 80 63 77 - 74 67 -  Wt. (Lbs) 231 245 - 232.59 234.04 - 234.8  BMI 37.28 39.54 - 37.54 37.78 - 36.77

## 2018-04-12 NOTE — Assessment & Plan Note (Signed)
The patient's Controlled Substance registry is reviewed and compliance confirmed. Adequacy of  Pain control and level of function is assessed. Medication dosing is adjusted as deemed appropriate. Twelve weeks of medication is prescribed , patient signs for the script and is provided with a follow up appointment between 11 to 12 weeks .  

## 2018-04-12 NOTE — Assessment & Plan Note (Signed)
C/o abnormal urine color and burning with urination No fever, chills or flank pain. CCUA abnormal , will send for c/s

## 2018-04-15 LAB — MISC LABCORP TEST (SEND OUT): Labcorp test code: 738526

## 2018-04-17 ENCOUNTER — Other Ambulatory Visit: Payer: Self-pay | Admitting: Pharmacy Technician

## 2018-04-17 ENCOUNTER — Other Ambulatory Visit: Payer: Self-pay

## 2018-04-17 NOTE — Patient Outreach (Signed)
[facility]) Care Management [place]  04/17/2018  [name] 20-Jul-1940 [id number]  Reason for referral: Medication assistance  [facility] pharmacy case is being closed due to the following reasons:   Patient and family have yet to fill out the patient assistance applications sent to them by [place].  Daughter, [name] states that she "has not gotten around to it."  Patient has been provided [facility] CM contact information if assistance needed in the future.    Thank you for allowing [facility] pharmacy to be involved in this patient's care.    Joetta Manners, PharmD Clinical Pharmacist [place] [phone]

## 2018-04-17 NOTE — Patient Outreach (Signed)
[facility]) Care Management  04/17/2018  [name] 07-24-1940 [id number]    Successful call placed to patients daughter, Pam regarding patient assistance application(s) for Pradaxa , HIPAA identifiers verified. Pam states that she still has not filled out patient assistance application for her mom. She states that she "just hasn't gotten around to it, but she will". Confirmed Pam has my contact information for if she has any questions about it.  Will route note to [place] for case closure due to lack of response for assistance from family.   Maud Deed Chana Bode [employer] Certified Pharmacy Technician [place] Management Direct Dial:[phone]

## 2018-04-23 ENCOUNTER — Other Ambulatory Visit: Payer: Self-pay | Admitting: Family Medicine

## 2018-05-02 ENCOUNTER — Other Ambulatory Visit: Payer: Self-pay | Admitting: Family Medicine

## 2018-05-10 DIAGNOSIS — Z96659 Presence of unspecified artificial knee joint: Secondary | ICD-10-CM | POA: Diagnosis not present

## 2018-05-10 DIAGNOSIS — G894 Chronic pain syndrome: Secondary | ICD-10-CM | POA: Diagnosis not present

## 2018-05-10 DIAGNOSIS — M1990 Unspecified osteoarthritis, unspecified site: Secondary | ICD-10-CM | POA: Diagnosis not present

## 2018-05-14 ENCOUNTER — Other Ambulatory Visit: Payer: Self-pay | Admitting: Family Medicine

## 2018-05-24 DIAGNOSIS — N3000 Acute cystitis without hematuria: Secondary | ICD-10-CM | POA: Diagnosis not present

## 2018-05-24 DIAGNOSIS — N200 Calculus of kidney: Secondary | ICD-10-CM | POA: Diagnosis not present

## 2018-05-29 ENCOUNTER — Telehealth: Payer: Self-pay | Admitting: Family Medicine

## 2018-05-29 NOTE — Telephone Encounter (Signed)
Daughter called and wants hydrOXYzine (ATARAX/VISTARIL) 25 MG tablet [[id number]]  Called into [facility] for her mothers itching.

## 2018-05-30 ENCOUNTER — Other Ambulatory Visit: Payer: Self-pay

## 2018-05-30 MED ORDER — HYDROXYZINE HCL 25 MG PO TABS: 25.0000 mg | ORAL_TABLET | Freq: Three times a day (TID) | ORAL | Status: DC | PRN

## 2018-05-30 NOTE — Telephone Encounter (Signed)
Refill sent.

## 2018-06-10 DIAGNOSIS — G894 Chronic pain syndrome: Secondary | ICD-10-CM | POA: Diagnosis not present

## 2018-06-10 DIAGNOSIS — Z96659 Presence of unspecified artificial knee joint: Secondary | ICD-10-CM | POA: Diagnosis not present

## 2018-06-10 DIAGNOSIS — M1990 Unspecified osteoarthritis, unspecified site: Secondary | ICD-10-CM | POA: Diagnosis not present

## 2018-06-22 ENCOUNTER — Telehealth: Payer: Self-pay | Admitting: *Deleted

## 2018-06-22 NOTE — Telephone Encounter (Signed)
[name] called and said Pt is down to one percocet and needs a refill sent in because she is not going to have anymore.

## 2018-06-22 NOTE — Telephone Encounter (Signed)
This was sent to the pharmacy since lastmonth

## 2018-07-03 ENCOUNTER — Ambulatory Visit (INDEPENDENT_AMBULATORY_CARE_PROVIDER_SITE_OTHER): Payer: Medicare HMO | Admitting: Family Medicine

## 2018-07-03 ENCOUNTER — Encounter: Payer: Self-pay | Admitting: Family Medicine

## 2018-07-03 ENCOUNTER — Other Ambulatory Visit: Payer: Self-pay

## 2018-07-03 DIAGNOSIS — IMO0001 Reserved for inherently not codable concepts without codable children: Secondary | ICD-10-CM

## 2018-07-03 DIAGNOSIS — G8929 Other chronic pain: Secondary | ICD-10-CM

## 2018-07-03 DIAGNOSIS — E1165 Type 2 diabetes mellitus with hyperglycemia: Secondary | ICD-10-CM | POA: Diagnosis not present

## 2018-07-03 DIAGNOSIS — G894 Chronic pain syndrome: Secondary | ICD-10-CM | POA: Diagnosis not present

## 2018-07-03 DIAGNOSIS — Z86711 Personal history of pulmonary embolism: Secondary | ICD-10-CM | POA: Diagnosis not present

## 2018-07-03 DIAGNOSIS — E785 Hyperlipidemia, unspecified: Secondary | ICD-10-CM | POA: Diagnosis not present

## 2018-07-03 DIAGNOSIS — R413 Other amnesia: Secondary | ICD-10-CM

## 2018-07-03 DIAGNOSIS — F418 Other specified anxiety disorders: Secondary | ICD-10-CM | POA: Diagnosis not present

## 2018-07-03 DIAGNOSIS — Z794 Long term (current) use of insulin: Secondary | ICD-10-CM | POA: Diagnosis not present

## 2018-07-03 DIAGNOSIS — I10 Essential (primary) hypertension: Secondary | ICD-10-CM | POA: Diagnosis not present

## 2018-07-03 MED ORDER — CLOTRIMAZOLE-BETAMETHASONE 1-0.05 % EX CREA: 1.0000 "application " | TOPICAL_CREAM | Freq: Two times a day (BID) | CUTANEOUS | 2 refills | Status: DC

## 2018-07-03 MED ORDER — TEMAZEPAM 30 MG PO CAPS: ORAL_CAPSULE | ORAL | 3 refills | Status: DC

## 2018-07-03 MED ORDER — ACETAMINOPHEN 500 MG PO TABS: ORAL_TABLET | ORAL | 5 refills | Status: DC

## 2018-07-03 MED ORDER — NYSTATIN 100000 UNIT/GM EX POWD: Freq: Every day | CUTANEOUS | 2 refills | Status: DC | PRN

## 2018-07-03 NOTE — Patient Instructions (Addendum)
F/U in 13 weeks, please call if you need me sooner  Please add tylenol 500 mg one twice daily for chronic pain management as well as make an effort to move more regularly , use topical rubs and compresses to painful joints to help with your pain  New script is due June 7 , this will be sent , and we are conacting the Pharmacy about your medication  If you feel that your pin needs are not being adequately managed, I  Recommend consultation with a pain Specialist ,and am happy to do this. I do not want youto suffer in pain, and I also want the safest  and ,most effective management of your pain  Labs today please  Thanks for choosing [facility], we consider it a privelige to serve you. Social distancing. Frequent hand washing with soap and water Keeping your hands off of your face. These 3 practices will help to keep both you and your community healthy during this time. Please practice them faithfully!

## 2018-07-03 NOTE — Progress Notes (Signed)
[name]     MRN: [id number]      DOB: Mar 07, 1940   HPI [name] is here for follow up and re-evaluation of chronic medical conditions, medication management and review of any available recent lab and radiology data.  Preventive health is updated, specifically  Cancer screening and Immunization.   Questions or concerns regarding consultations or procedures which the PT has had in the interim are  addressed. The PT denies any adverse reactions to current medications since the last visit.  C/o chronic  pain, family states she is always crying in pain and not very active because of pain, offer made for her to see pain management however, patient not very keen, though her sister/ caregiver seems a bit anxious about her quality of life as it relates to inadequate pain control Denies any falls, but not very active Appetite is good, no c/o constipation or loose stools, no recurrent pain and from kidney stone, and no malodorus urine or dysuria    ROS Denies recent fever or chills. Denies sinus pressure, nasal congestion, ear pain or sore throat. Denies chest congestion, productive cough or wheezing. Denies chest pains, palpitations and leg swelling Denies abdominal pain, nausea, vomiting,diarrhea or constipation.   Denies dysuria, frequency, hesitancy or incontinence. C/o chronic  joint pain, swelling and limitation in mobility. Denies headaches, seizures, numbness, or tingling. Denies depression, c/o chronic  anxiety and  insomnia. Denies skin break down or rash.   PE  BP 116/62   Pulse 98   Resp 12   Ht 5\' 7"  (1.702 m)   Wt 230 lb 1.9 oz (104.4 kg)   SpO2 95%   BMI 36.04 kg/m   Patient alert and oriented and in no cardiopulmonary distress.  HEENT: No facial asymmetry, EOMI,   oropharynx pink and moist.  Neck supple no JVD, no mass.  Chest: Clear to auscultation bilaterally.  CVS: S1, S2 no murmurs, no S3.Regular rate.  ABD: Soft non tender.   Ext: No edema  MS:  Decreased  ROM spine, shoulders, hips and knees.  Skin: Intact, no ulcerations or rash noted.  Psych: Good eye contact, normal affect. Memory impaired not anxious or depressed appearing.  CNS: CN 2-12 intact, power,  normal throughout.no focal deficits noted.   Assessment & Plan  Essential hypertension Controlled, no change in medication DASH diet and commitment to daily physical activity for a minimum of 30 minutes discussed and encouraged, as a part of hypertension management. The importance of attaining a healthy weight is also discussed.  BP/Weight 07/03/2018 04/10/2018 03/21/2018 03/20/2018 03/17/2018 03/15/2018 4/62/7035  Systolic BP 009 381 829 937 - 169 678  Diastolic BP 62 80 63 77 - 74 67  Wt. (Lbs) 230.12 231 245 - 232.59 234.04 -  BMI 36.04 37.28 39.54 - 37.54 37.78 -       Diabetes mellitus, insulin dependent (IDDM), uncontrolled (HCC) Improved, increase lantus to 85 units daily [name] is reminded of the importance of commitment to daily physical activity for 30 minutes or more, as able and the need to limit carbohydrate intake to 30 to 60 grams per meal to help with blood sugar control.   The need to take medication as prescribed, test blood sugar as directed, and to call between visits if there is a concern that blood sugar is uncontrolled is also discussed.   [name] is reminded of the importance of daily foot exam, annual eye examination, and good blood sugar, blood pressure and cholesterol control.  Diabetic Labs Latest Ref Rng & Units 07/03/2018 03/19/2018 03/18/2018 03/17/2018 03/15/2018  HbA1c <5.7 % of total Hgb 8.0(H) - - - -  Microalbumin mg/dL - - - - -  Micro/Creat Ratio <30 mcg/mg creat - - - - -  Chol <200 mg/dL 119 - - - -  HDL > OR = 50 mg/dL 49(L) - - - -  Calc LDL mg/dL (calc) 47 - - - -  Triglycerides <150 mg/dL 144 - - - -  Creatinine 0.60 - 0.93 mg/dL 1.45(H) 1.09(H) 1.11(H) 1.29(H) 1.19(H)   BP/Weight 07/03/2018 04/10/2018 03/21/2018 03/20/2018 03/17/2018  03/15/2018 4/97/0263  Systolic BP 785 885 027 741 - 287 867  Diastolic BP 62 80 63 77 - 74 67  Wt. (Lbs) 230.12 231 245 - 232.59 234.04 -  BMI 36.04 37.28 39.54 - 37.54 37.78 -   Foot/eye exam completion dates Latest Ref Rng & Units 08/10/2017 07/14/2016  Eye Exam No Retinopathy - -  Foot exam Order - - -  Foot Form Completion - Done Done        Morbid obesity ([place]) obesity linked with hypertension and iDDM   Patient re-educated about  the importance of commitment to a  minimum of 150 minutes of exercise per week as able.  The importance of healthy food choices with portion control discussed, as well as eating regularly and within a 12 hour window most days. The need to choose "clean , green" food 50 to 75% of the time is discussed, as well as to make water the primary drink and set a goal of 64 ounces water daily.  Encouraged to start a food diary,  and to consider  joining a support group. Sample diet sheets offered. Goals set by the patient for the next several months.   Weight /BMI 07/03/2018 04/10/2018 03/21/2018  WEIGHT 230 lb 1.9 oz 231 lb 245 lb  HEIGHT 5\' 7"  5\' 6"  -  BMI 36.04 kg/m2 37.28 kg/m2 39.54 kg/m2      History of pulmonary embolism Chronic anti coagulation, no c/o hematuria or rectal bleeding  Chronic pain syndrome The patient's Controlled Substance registry is reviewed and compliance confirmed. Major discrepancy on record as far as collecting medication suggests collection is every 6 to 8 weeks, sister insists that pt needs and uses the medication twice daily and may even benefit from increased dosing. Pharmacy will be contacted re the discrepancy, and sister advised to bring in empty bottles to next visit Adequacy of  Pain control and level of function is assessed. Medication dosing is adjusted as deemed appropriate. Twelve weeks of medication is prescribed , patient signs for the script and is provided with a follow up appointment between 11 to 12 weeks .    Dyslipidemia Controlled, no change in medication Hyperlipidemia:Low fat diet discussed and encouraged.   Lipid Panel  Lab Results  Component Value Date   CHOL 119 07/03/2018   HDL 49 (L) 07/03/2018   LDLCALC 47 07/03/2018   TRIG 144 07/03/2018   CHOLHDL 2.4 07/03/2018       Encounter for chronic pain management The patient's Controlled Substance registry is reviewed and compliance confirmed. Adequacy of  Pain control and level of function is assessed. Medication dosing is adjusted as deemed appropriate. Twelve weeks of medication is prescribed , patient signs for the script and is provided with a follow up appointment between 11 to 12 weeks .   Memory loss No behavioral disturbance, continue current medication  Depression with anxiety Controlled, no change  in medication

## 2018-07-04 ENCOUNTER — Encounter: Payer: Self-pay | Admitting: Family Medicine

## 2018-07-04 LAB — CBC
HEMATOCRIT: 28.2 % — AB (ref 35.0–45.0)
HEMOGLOBIN: 8.7 g/dL — AB (ref 11.7–15.5)
MCH: 21.6 pg — ABNORMAL LOW (ref 27.0–33.0)
MCHC: 30.9 g/dL — ABNORMAL LOW (ref 32.0–36.0)
MCV: 70 fL — AB (ref 80.0–100.0)
MPV: 9.1 fL (ref 7.5–12.5)
Platelets: 594 10*3/uL — ABNORMAL HIGH (ref 140–400)
RBC: 4.03 10*6/uL (ref 3.80–5.10)
RDW: 17.1 % — ABNORMAL HIGH (ref 11.0–15.0)
WBC: 10.7 10*3/uL (ref 3.8–10.8)

## 2018-07-04 LAB — LIPID PANEL
CHOLESTEROL: 119 mg/dL (ref <200)
HDL: 49 mg/dL — ABNORMAL LOW (ref >=50)
LDL Cholesterol (Calc): 47 mg/dL (calc)
Non-HDL Cholesterol (Calc): 70 mg/dL (calc) (ref <130)
Total CHOL/HDL Ratio: 2.4 (calc) (ref <5.0)
Triglycerides: 144 mg/dL (ref <150)

## 2018-07-04 LAB — COMPLETE METABOLIC PANEL WITH GFR
AG Ratio: 1.1 (calc) (ref 1.0–2.5)
ALT: 7 U/L (ref 6–29)
AST: 7 U/L — ABNORMAL LOW (ref 10–35)
Albumin: 3.9 g/dL (ref 3.6–5.1)
Alkaline phosphatase (APISO): 103 U/L (ref 37–153)
BUN / CREAT RATIO: 10 (calc) (ref 6–22)
BUN: 15 mg/dL (ref 7–25)
CO2: 23 mmol/L (ref 20–32)
Calcium: 9.6 mg/dL (ref 8.6–10.4)
Chloride: 104 mmol/L (ref 98–110)
Creat: 1.45 mg/dL — ABNORMAL HIGH (ref 0.60–0.93)
GFR, EST AFRICAN AMERICAN: 40 mL/min/{1.73_m2} — AB (ref >=60)
GFR, Est Non African American: 34 mL/min/{1.73_m2} — ABNORMAL LOW (ref >=60)
Globulin: 3.6 g/dL (calc) (ref 1.9–3.7)
Glucose, Bld: 170 mg/dL — ABNORMAL HIGH (ref 65–99)
Potassium: 4 mmol/L (ref 3.5–5.3)
Sodium: 138 mmol/L (ref 135–146)
Total Bilirubin: 0.2 mg/dL (ref 0.2–1.2)
Total Protein: 7.5 g/dL (ref 6.1–8.1)

## 2018-07-04 LAB — HEMOGLOBIN A1C
Hgb A1c MFr Bld: 8 % of total Hgb — ABNORMAL HIGH (ref <5.7)
Mean Plasma Glucose: 183 (calc)
eAG (mmol/L): 10.1 (calc)

## 2018-07-04 NOTE — Assessment & Plan Note (Signed)
Improved, increase lantus to 85 units daily [name] is reminded of the importance of commitment to daily physical activity for 30 minutes or more, as able and the need to limit carbohydrate intake to 30 to 60 grams per meal to help with blood sugar control.   The need to take medication as prescribed, test blood sugar as directed, and to call between visits if there is a concern that blood sugar is uncontrolled is also discussed.   [name] is reminded of the importance of daily foot exam, annual eye examination, and good blood sugar, blood pressure and cholesterol control.  Diabetic Labs Latest Ref Rng & Units 07/03/2018 03/19/2018 03/18/2018 03/17/2018 03/15/2018  HbA1c <5.7 % of total Hgb 8.0(H) - - - -  Microalbumin mg/dL - - - - -  Micro/Creat Ratio <30 mcg/mg creat - - - - -  Chol <200 mg/dL 119 - - - -  HDL > OR = 50 mg/dL 49(L) - - - -  Calc LDL mg/dL (calc) 47 - - - -  Triglycerides <150 mg/dL 144 - - - -  Creatinine 0.60 - 0.93 mg/dL 1.45(H) 1.09(H) 1.11(H) 1.29(H) 1.19(H)   BP/Weight 07/03/2018 04/10/2018 03/21/2018 03/20/2018 03/17/2018 03/15/2018 3/34/3568  Systolic BP 616 837 290 211 - 155 208  Diastolic BP 62 80 63 77 - 74 67  Wt. (Lbs) 230.12 231 245 - 232.59 234.04 -  BMI 36.04 37.28 39.54 - 37.54 37.78 -   Foot/eye exam completion dates Latest Ref Rng & Units 08/10/2017 07/14/2016  Eye Exam No Retinopathy - -  Foot exam Order - - -  Foot Form Completion - Done Done

## 2018-07-04 NOTE — Assessment & Plan Note (Signed)
Controlled, no change in medication DASH diet and commitment to daily physical activity for a minimum of 30 minutes discussed and encouraged, as a part of hypertension management. The importance of attaining a healthy weight is also discussed.  BP/Weight 07/03/2018 04/10/2018 03/21/2018 03/20/2018 03/17/2018 03/15/2018 04/15/3141  Systolic BP 888 757 972 820 - 601 561  Diastolic BP 62 80 63 77 - 74 67  Wt. (Lbs) 230.12 231 245 - 232.59 234.04 -  BMI 36.04 37.28 39.54 - 37.54 37.78 -

## 2018-07-04 NOTE — Assessment & Plan Note (Signed)
Controlled, no change in medication Hyperlipidemia:Low fat diet discussed and encouraged.   Lipid Panel  Lab Results  Component Value Date   CHOL 119 07/03/2018   HDL 49 (L) 07/03/2018   LDLCALC 47 07/03/2018   TRIG 144 07/03/2018   CHOLHDL 2.4 07/03/2018

## 2018-07-04 NOTE — Assessment & Plan Note (Signed)
The patient's Controlled Substance registry is reviewed and compliance confirmed. Major discrepancy on record as far as collecting medication suggests collection is every 6 to 8 weeks, sister insists that pt needs and uses the medication twice daily and may even benefit from increased dosing. Pharmacy will be contacted re the discrepancy, and sister advised to bring in empty bottles to next visit Adequacy of  Pain control and level of function is assessed. Medication dosing is adjusted as deemed appropriate. Twelve weeks of medication is prescribed , patient signs for the script and is provided with a follow up appointment between 11 to 12 weeks .

## 2018-07-04 NOTE — Assessment & Plan Note (Signed)
The patient's Controlled Substance registry is reviewed and compliance confirmed. Adequacy of  Pain control and level of function is assessed. Medication dosing is adjusted as deemed appropriate. Twelve weeks of medication is prescribed , patient signs for the script and is provided with a follow up appointment between 11 to 12 weeks .  

## 2018-07-04 NOTE — Assessment & Plan Note (Signed)
obesity linked with hypertension and iDDM   Patient re-educated about  the importance of commitment to a  minimum of 150 minutes of exercise per week as able.  The importance of healthy food choices with portion control discussed, as well as eating regularly and within a 12 hour window most days. The need to choose "clean , green" food 50 to 75% of the time is discussed, as well as to make water the primary drink and set a goal of 64 ounces water daily.  Encouraged to start a food diary,  and to consider  joining a support group. Sample diet sheets offered. Goals set by the patient for the next several months.   Weight /BMI 07/03/2018 04/10/2018 03/21/2018  WEIGHT 230 lb 1.9 oz 231 lb 245 lb  HEIGHT 5\' 7"  5\' 6"  -  BMI 36.04 kg/m2 37.28 kg/m2 39.54 kg/m2

## 2018-07-04 NOTE — Assessment & Plan Note (Signed)
No behavioral disturbance, continue current medication

## 2018-07-04 NOTE — Assessment & Plan Note (Signed)
Chronic anti coagulation, no c/o hematuria or rectal bleeding

## 2018-07-09 ENCOUNTER — Encounter: Payer: Self-pay | Admitting: Family Medicine

## 2018-07-09 MED ORDER — OXYCODONE-ACETAMINOPHEN 10-325 MG PO TABS: ORAL_TABLET | ORAL | 0 refills | Status: AC

## 2018-07-09 NOTE — Assessment & Plan Note (Signed)
Controlled, no change in medication  

## 2018-07-10 DIAGNOSIS — Z96659 Presence of unspecified artificial knee joint: Secondary | ICD-10-CM | POA: Diagnosis not present

## 2018-07-10 DIAGNOSIS — G894 Chronic pain syndrome: Secondary | ICD-10-CM | POA: Diagnosis not present

## 2018-07-10 DIAGNOSIS — M1990 Unspecified osteoarthritis, unspecified site: Secondary | ICD-10-CM | POA: Diagnosis not present

## 2018-07-20 ENCOUNTER — Other Ambulatory Visit: Payer: Self-pay | Admitting: Family Medicine

## 2018-07-27 ENCOUNTER — Telehealth: Payer: Self-pay | Admitting: Family Medicine

## 2018-07-27 NOTE — Telephone Encounter (Signed)
pls call pharmacy and see what she collected last

## 2018-07-27 NOTE — Telephone Encounter (Signed)
On 6/1 clonidine 0.2 one tab daily #90

## 2018-07-27 NOTE — Telephone Encounter (Signed)
pls review with her that the correct dose is as writtwen and her blood pressure has been excellent

## 2018-07-27 NOTE — Telephone Encounter (Signed)
Prior to this RX pt was taken 3 a day   This RX shows only one a day   (what is correct) I dont see any changes on the AVS   cloNIDine (CATAPRES) 0.2 MG tablet  Please call [name]

## 2018-07-28 NOTE — Telephone Encounter (Signed)
Spoke with [name] and let her know what was picked up at pharmacy on 6/1 was correct and on the dose bp's have been excellent and she verbalized understanding and was ok with this

## 2018-08-01 ENCOUNTER — Other Ambulatory Visit: Payer: Self-pay | Admitting: Family Medicine

## 2018-08-05 ENCOUNTER — Other Ambulatory Visit: Payer: Self-pay | Admitting: Family Medicine

## 2018-08-09 ENCOUNTER — Ambulatory Visit (INDEPENDENT_AMBULATORY_CARE_PROVIDER_SITE_OTHER): Payer: Medicare HMO | Admitting: Family Medicine

## 2018-08-09 ENCOUNTER — Other Ambulatory Visit: Payer: Self-pay

## 2018-08-09 ENCOUNTER — Encounter: Payer: Self-pay | Admitting: Family Medicine

## 2018-08-09 VITALS — BP 116/62 | HR 98 | Resp 12 | Ht 67.0 in | Wt 230.0 lb

## 2018-08-09 DIAGNOSIS — Z0000 Encounter for general adult medical examination without abnormal findings: Secondary | ICD-10-CM | POA: Diagnosis not present

## 2018-08-09 NOTE — Progress Notes (Signed)
Subjective:   [name] is a 78 y.o. female who presents for Medicare Annual (Subsequent) preventive examination.  Location of Patient: Home Location of Provider: Telehealth Consent was obtain for visit to be over via telehealth. I verified that I am speaking with the correct person using two identifiers.   Review of Systems:    Cardiac Risk Factors include: advanced age (>34mn, >>6women);dyslipidemia;obesity (BMI >30kg/m2);diabetes mellitus;hypertension     Objective:     Vitals: BP 116/62   Pulse 98   Resp 12   Ht 5' 7"  (1.702 m)   Wt 230 lb (104.3 kg)   BMI 36.02 kg/m   Body mass index is 36.02 kg/m.  Advanced Directives 03/21/2018 03/17/2018 02/25/2018 02/24/2018 01/25/2018 01/24/2018 12/18/2017  Does Patient Have a Medical Advance Directive? Yes Yes Yes Yes Yes Yes No  Type of AArts administratorPower of AOmahaof AM[place]of AA[place]al-  Does patient want to make changes to medical advance directive? No - Patient declined No - Patient declined No - Patient declined - No - Patient declined - -  Copy of HTrinityin Chart? No - copy requested No - copy requested No - copy requested No - copy requested No - copy requested - -  Would patient like information on creating a medical advance directive? - - - - - - -  Pre-existing out of facility DNR order (yellow form or pink MOST form) - - - - - - -    Tobacco Social History   Tobacco Use  Smoking Status Never Smoker  Smokeless Tobacco Never Used     Counseling given: Not Answered   Clinical Intake:  Pre-visit preparation completed: Yes  Pain Score: 7      BMI - recorded: 36.02 Nutritional Status: BMI > 30  Obese Nutritional Risks: None Diabetes: Yes CBG done?: No Did pt. bring in CBG monitor from home?: No  How often do you need to have someone help you  when you read instructions, pamphlets, or other written materials from your doctor or pharmacy?: 1 - Never What is the last grade level you completed in school?: 11  Interpreter Needed?: No     Past Medical History:  Diagnosis Date  . Anemia, iron deficiency 2012   Evaluated by Dr. FOneida Alar H&H of 9.3/30.8 with [place] 10[place]2010; 3/3 positive Hemoccult cards in 07/2011  . Anxiety    was on Prozac for a couple of weeks  . [place]    takes Coumadin daily  . Bruises easily    takes Coumadin  . Chronic anticoagulation 07/21/2010  . Chronic back pain    related to knee pain  . Degenerative joint disease    Knees  . [place]   . Depression   . Diabetes mellitus    takes Metformin daily  . Gastroesophageal reflux disease    takes Omeprazole daily  . Glaucoma   . Glaucoma    uses eye drops at night  . History of blood transfusion 1969  . History of gout    was on medication but taken off of several months ago  . Hx of colonic polyps   . Hx of migraines    last one about a yr ago;takes Topamax daily  . Hyperlipidemia    takes Pravastatin daily  . Hypertension    takes Hyzaar daily and Propranolol   .  Insomnia    takes Restoril prn  . Joint pain   . Joint swelling   . Memory loss    takes donepezil  . Migraine   . Migraine   . Nocturia   . Overweight(278.02)   . Pneumonia 1969   hx of  . Pulmonary embolism [facility]) May 2012   acute presentation, bilateral PE  . Skin spots-aging   . Urinary frequency    Past Surgical History:  Procedure Laterality Date  . CATARACT EXTRACTION W/PHACO Right 04/11/2012   Procedure: CATARACT EXTRACTION PHACO AND INTRAOCULAR LENS PLACEMENT (IOC);  Surgeon: Elta Guadeloupe T. Gershon Crane, MD;  Location: [facility];  Service: Ophthalmology;  Laterality: Right;  CDE=9.61  . CATARACT EXTRACTION W/PHACO Left 12/26/2012   Procedure: CATARACT EXTRACTION PHACO AND INTRAOCULAR LENS PLACEMENT (IOC);  Surgeon: Elta Guadeloupe T. Gershon Crane, MD;  Location: [facility];   Service: Ophthalmology;  Laterality: Left;  CDE:7.74  . COLONOSCOPY  Jan 2002; 2012   2002: Dr. Tamala Julian, ext. hemorrhoids, nl colon; 2012-adenomatous polyps, gastritis on EGD  . CYSTOSCOPY W/ URETERAL STENT PLACEMENT Bilateral 02/25/2018   Procedure: CYSTOSCOPY WITH BILATERAL RETROGRADE PYELOGRAM/BILATERAL URETERAL STENT PLACEMENT, BLADDER BIOPSIES WITH FULGERATION;  Surgeon: [name], MD;  Location: [facility];  Service: Urology;  Laterality: Bilateral;  . CYSTOSCOPY/URETEROSCOPY/HOLMIUM LASER/STENT PLACEMENT Bilateral 03/21/2018   Procedure: CYSTOSCOPY/URETEROSCOPY/HOLMIUM LASER/STENT PLACEMENT;  Surgeon: [name], MD;  Location: [facility];  Service: Urology;  Laterality: Bilateral;  ONLY NEEDS 60 MIN  . DILATION AND CURETTAGE OF UTERUS    . ESOPHAGOGASTRODUODENOSCOPY  08/24/2011   ESOPHAGOGASTRODUODENOSCOPY; esophageal dilatation; Rogene Houston, MD;  . Freda Munro CAPSULE STUDY  08/18/2011   Procedure: GIVENS CAPSULE STUDY;  Surgeon: Rogene Houston, MD;  [facility]: [facility];  Service: Endoscopy;  Laterality: N/A;  730  . KNEE ARTHROSCOPY W/ MENISCECTOMY  90's   Left  . ORIF ANKLE FRACTURE Right 90's  . TONSILLECTOMY    . TOTAL KNEE ARTHROPLASTY  10/20/2011   Procedure: TOTAL KNEE ARTHROPLASTY;  Surgeon: Ninetta Lights, MD;  Location: [facility];  Service: Orthopedics;  Laterality: Left;  . UPPER GASTROINTESTINAL ENDOSCOPY    . URETHRAL DILATION     Family History  Problem Relation Age of Onset  . Diabetes Mother   . Hypertension Mother   . Arthritis Mother   . Heart failure Mother   . Migraines Mother   . Kidney failure Mother   . Leukemia Father   . Migraines Father   . Kidney failure Father   . Diabetes Sister   . Hypertension Sister   . Diabetes Brother   . Hypertension Brother   . Hypertension Brother   . Hypertension Brother   . Hypertension Brother   . Diabetes Brother   . Diabetes Brother   . Diabetes Brother   . Pulmonary embolism Sister   .  Migraines Sister   . Kidney failure Brother   . Colon cancer Neg Hx   . Colon polyps Neg Hx    Social History   Socioeconomic History  . Marital status: Widowed    Spouse name: Not on file  . Number of children: 2  . Years of education: 68  . Highest education level: Not on file  Occupational History  . 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 Financial resource strain: Not hard at all  . Food insecurity    Worry: Never true    Inability: Never true  .  Transportation needs    Medical: No    Non-medical: No  Tobacco Use  . Smoking status: Never Smoker  . Smokeless tobacco: Never Used  Substance and Sexual Activity  . Alcohol use: No  . Drug use: No  . Sexual activity: Not Currently    Birth control/protection: Post-menopausal  Lifestyle  . Physical activity    Days per week: 0 days    Minutes per session: 0 min  . Stress: Very much  Relationships  . Social connections    Talks on phone: More than three times a week    Gets together: More than three times a week    Attends religious service: 1 to 4 times per year    Active member of club or organization: No    Attends meetings of clubs or organizations: Never    Relationship status: Widowed  Other Topics Concern  . Not on file  Social History Narrative   Lives at home with husband.   Right-handed.   1 cup caffeine per day.    Outpatient Encounter Medications as of 08/09/2018  Medication Sig  . ACCU-CHEK AVIVA PLUS test strip USE TO TEST BLOOD SUAGR 2 TIMES A DAY  . acetaminophen (TYLENOL) 500 MG tablet Take one tablet two times daily for chronic pain  . albuterol (VENTOLIN HFA) 108 (90 Base) MCG/ACT inhaler Inhale 2 puffs into the lungs every 6 (six) hours as needed for wheezing or shortness of breath.  Marland Kitchen amLODipine (NORVASC) 10 MG tablet Take 1 tablet (10 mg total) by mouth daily.  Marland Kitchen atorvastatin (LIPITOR) 40 MG tablet Take 1 tablet (40 mg total) by mouth  daily.  . Blood Glucose Monitoring Suppl (ACCU-CHEK AVIVA PLUS) w/Device KIT For once daily testing dx E11.9  . busPIRone (BUSPAR) 7.5 MG tablet TAKE 1 TABLET BY MOUTH 3 TIMES DAILY (Patient taking differently: Take 7.5 mg by mouth 3 (three) times daily. )  . cloNIDine (CATAPRES) 0.2 MG tablet Take 1 tablet (0.2 mg total) by mouth at bedtime.  . clotrimazole-betamethasone (LOTRISONE) cream Apply 1 application topically 2 (two) times daily. D/c vusion ointment  . colchicine 0.6 MG tablet Take 1 tablet (0.6 mg total) by mouth daily. May decrease to once per day as needed for diarrhea  . COMBIGAN 0.2-0.5 % ophthalmic solution Place 1 drop into both eyes 2 (two) times daily.  Marland Kitchen conjugated estrogens (PREMARIN) vaginal cream Place vaginally 2 (two) times a week. Apply sparingly ( fingertip)  Twice weekly to vaginal area on Wednesdays and Fridays  . dabigatran (PRADAXA) 150 MG CAPS capsule Take 1 capsule (150 mg total) by mouth 2 (two) times daily.  Marland Kitchen docusate sodium (COLACE) 100 MG capsule Take 1 capsule (100 mg total) by mouth daily as needed for mild constipation.  Marland Kitchen donepezil (ARICEPT) 10 MG tablet TAKE 1 TABLET BY MOUTH EVERYDAY AT BEDTIME (Patient taking differently: Take 10 mg by mouth at bedtime. )  . Fluoxetine HCl, PMDD, 10 MG TABS Take 1 tablet (10 mg total) by mouth daily.  . hydrOXYzine (ATARAX/VISTARIL) 25 MG tablet Take 1 tablet (25 mg total) by mouth 3 (three) times daily as needed for itching.  Marland Kitchen imipramine (TOFRANIL) 50 MG tablet TAKE 2 TABLETS (100 MG TOTAL) BY MOUTH AT BEDTIME.  . Insulin Glargine (LANTUS SOLOSTAR) 100 UNIT/ML Solostar Pen Inject 40 Units into the skin 2 (two) times daily.  . meclizine (ANTIVERT) 25 MG tablet TAKE 1 TABLET THREE TIMES DAILY AS NEEDED FOR DIZZINESS.  . montelukast (SINGULAIR) 10 MG tablet  TAKE 1 TABLET BY MOUTH AT BEDTIME  . nystatin (MYCOSTATIN/NYSTOP) powder Apply topically daily as needed (for rash under breasts).  Marland Kitchen olopatadine (PATANOL) 0.1 %  ophthalmic solution Place 1 drop into both eyes 2 (two) times daily.  Marland Kitchen omeprazole (PRILOSEC) 40 MG capsule TAKE 1 CAPSULE BY MOUTH TWICE A DAY  . oxyCODONE-acetaminophen (PERCOCET) 10-325 MG tablet Take one tablet by mouth two times daily for back pain  . [START ON 08/22/2018] oxyCODONE-acetaminophen (PERCOCET) 10-325 MG tablet Take one tablet two times daily by mouth, for back pain  . [START ON 09/21/2018] oxyCODONE-acetaminophen (PERCOCET) 10-325 MG tablet Take one tablet by mouth two times daily for back pain  . polyethylene glycol (MIRALAX / GLYCOLAX) packet Take 17 g by mouth daily.  Marland Kitchen senna-docusate (SENOKOT-S) 8.6-50 MG tablet Take 2 tablets by mouth at bedtime. (Patient taking differently: Take 2 tablets by mouth 2 (two) times daily. )  . SUMAtriptan (IMITREX) 5 MG/ACT nasal spray Place 1 spray into the nose every 2 (two) hours as needed for migraine.   . temazepam (RESTORIL) 30 MG capsule TAKE 1 CAPSULE BY MOUTH AT BEDTIME AS NEEDED FOR SLEEP  . topiramate (TOPAMAX) 100 MG tablet TAKE 1 TABLET BY MOUTH TWICE A DAY  . Vitamin D, Ergocalciferol, (DRISDOL) 50000 units CAPS capsule TAKE 1 CAPSULE (50,000 UNITS TOTAL) BY MOUTH ONCE A WEEK. (Patient taking differently: Take 50,000 Units by mouth every 7 (seven) days. Saturday)   Facility-Administered Encounter Medications as of 08/09/2018  Medication  . fentaNYL (SUBLIMAZE) injection 25-50 mcg    Activities of Daily Living In your present state of health, do you have any difficulty performing the following activities: 08/09/2018 03/17/2018  Hearing? N N  Vision? N N  Difficulty concentrating or making decisions? Tempie Donning  Walking or climbing stairs? Y Y  Dressing or bathing? Y Y  Doing errands, shopping? Tempie Donning  Preparing Food and eating ? Y -  Using the Toilet? Y -  In the past six months, have you accidently leaked urine? Y -  Do you have problems with loss of bowel control? N -  Managing your Medications? Y -  Managing your Finances? Y -   Housekeeping or managing your Housekeeping? Y -  Some recent data might be hidden    Patient Care Team: [name], MD as PCP - General (Family Medicine) Harl Bowie, Alphonse Guild, MD as PCP - Cardiology (Cardiology) Rogene Houston, MD (Gastroenterology) Renette Butters, MD as Attending Physician (Orthopedic Surgery)    Assessment:   This is a routine wellness examination for Jeremie.  Exercise Activities and Dietary recommendations Current Exercise Habits: The patient does not participate in regular exercise at present, Exercise limited by: orthopedic condition(s)  Goals   None     Fall Risk Fall Risk  08/09/2018 07/03/2018 04/10/2018 03/15/2018 01/30/2018  Falls in the past year? 1 0 0 0 0  Number falls in past yr: 1 - 0 0 0  Injury with Fall? 0 0 0 0 0  Risk for fall due to : - - Impaired balance/gait;Impaired mobility;Medication side effect - -  Follow up - - Falls evaluation completed - -   Is the patient's home free of loose throw rugs in walkways, pet beds, electrical cords, etc?   yes      Grab bars in the bathroom? Bedside commode      Handrails on the stairs?   yes      Adequate lighting?   yes  Depression Screen PHQ 2/9 Scores 08/09/2018 07/03/2018 04/10/2018 03/15/2018  PHQ - 2 Score 0 1 3 4   PHQ- 9 Score - - 11 14     Cognitive Function MMSE - Mini Mental State Exam 04/23/2014  Orientation to time 2  Orientation to Place 2  Registration 0  Attention/ Calculation 0  Recall 0  Language- name 2 objects 2  Language- repeat 1  Language- follow 3 step command 3  Language- read & follow direction 1  Write a sentence 1  Copy design 1  Total score 13     6CIT Screen 08/09/2018 08/02/2017  What Year? 4 points 0 points  What month? 0 points 0 points  What time? 0 points 0 points  Count back from 20 0 points 0 points  Months in reverse 0 points 0 points  Repeat phrase 0 points 4 points  Total Score 4 4    Immunization History  Administered Date(s)  Administered  . H1N1 12/19/2007  . Influenza Split 11/23/2011  . Influenza Whole 01/07/2004, 11/14/2007, 02/03/2009, 11/30/2009  . Influenza, High Dose Seasonal PF 12/21/2017  . Influenza,inj,Quad PF,6+ Mos 11/01/2012, 01/01/2014, 04/24/2015, 10/29/2015, 01/11/2017  . Pneumococcal Conjugate-13 04/23/2014  . Pneumococcal Polysaccharide-23 08/05/2010  . Td 11/07/2003  . Zoster 04/12/2006    Qualifies for Shingles Vaccine? UTD  Screening Tests Health Maintenance  Topic Date Due  . OPHTHALMOLOGY EXAM  05/19/2017  . TETANUS/TDAP  09/27/2018 (Originally 11/06/2013)  . FOOT EXAM  08/11/2018  . INFLUENZA VACCINE  09/16/2018  . HEMOGLOBIN A1C  01/03/2019  . DEXA SCAN  Completed  . PNA vac Low Risk Adult  Completed    Cancer Screenings: Lung: Low Dose CT Chest recommended if Age 79-80 years, 30 pack-year currently smoking OR have quit w/in 15years. Patient does not qualify. Breast:  Up to date on Mammogram? No   Up to date of Bone Density/Dexa? YES Colorectal:  UTD  Additional Screenings:   Hepatitis C Screening: has not had this     Plan:      1. Encounter for Medicare annual wellness exam    I have personally reviewed and noted the following in the patient's chart:   . Medical and social history . Use of alcohol, tobacco or illicit drugs  . Current medications and supplements . Functional ability and status . Nutritional status . Physical activity . Advanced directives . List of other physicians . Hospitalizations, surgeries, and ER visits in previous 12 months . Vitals . Screenings to include cognitive, depression, and falls . Referrals and appointments  In addition, I have reviewed and discussed with patient certain preventive protocols, quality metrics, and best practice recommendations. A written personalized care plan for preventive services as well as general preventive health recommendations were provided to patient.    I provided 20 minutes of non-face-to-face  time during this encounter.    Perlie Mayo, NP  08/09/2018

## 2018-08-09 NOTE — Patient Instructions (Signed)
[name] , Thank you for taking time to come for your Medicare Wellness Visit. I appreciate your ongoing commitment to your health goals. Please review the following plan we discussed and let me know if I can assist you in the future.   Screening recommendations/referrals: Colonoscopy: UTD Mammogram: Not up to date, discuss with Dr Carmelina Peal Density: UTD Recommended yearly ophthalmology/optometry visit for glaucoma screening and checkup Recommended yearly dental visit for hygiene and checkup  Vaccinations: Influenza vaccine: Due in the Fall 2020 Pneumococcal vaccine: UTD Tdap vaccine: UTD Shingles vaccine: UTD     Conditions/risks identified: We discussed these during your visit.   Follow Up: 10/02/2018    Preventive Care 65 Years and Older, Female Preventive care refers to lifestyle choices and visits with your health care provider that can promote health and wellness. What does preventive care include?  A yearly physical exam. This is also called an annual well check.  Dental exams once or twice a year.  Routine eye exams. Ask your health care provider how often you should have your eyes checked.  Personal lifestyle choices, including:  Daily care of your teeth and gums.  Regular physical activity.  Eating a healthy diet.  Avoiding tobacco and drug use.  Limiting alcohol use.  Practicing safe sex.  Taking low-dose aspirin every day.  Taking vitamin and mineral supplements as recommended by your health care provider. What happens during an annual well check? The services and screenings done by your health care provider during your annual well check will depend on your age, overall health, lifestyle risk factors, and family history of disease. Counseling  Your health care provider may ask you questions about your:  Alcohol use.  Tobacco use.  Drug use.  Emotional well-being.  Home and relationship well-being.  Sexual activity.  Eating habits.   History of falls.  Memory and ability to understand (cognition).  Work and work Statistician.  Reproductive health. Screening  You may have the following tests or measurements:  Height, weight, and BMI.  Blood pressure.  Lipid and cholesterol levels. These may be checked every 5 years, or more frequently if you are over 49 years old.  Skin check.  Lung cancer screening. You may have this screening every year starting at age 39 if you have a 30-pack-year history of smoking and currently smoke or have quit within the past 15 years.  Fecal occult blood test (FOBT) of the stool. You may have this test every year starting at age 6.  Flexible sigmoidoscopy or colonoscopy. You may have a sigmoidoscopy every 5 years or a colonoscopy every 10 years starting at age 24.  Hepatitis C blood test.  Hepatitis B blood test.  Sexually transmitted disease (STD) testing.  Diabetes screening. This is done by checking your blood sugar (glucose) after you have not eaten for a while (fasting). You may have this done every 1-3 years.  Bone density scan. This is done to screen for osteoporosis. You may have this done starting at age 12.  Mammogram. This may be done every 1-2 years. Talk to your health care provider about how often you should have regular mammograms. Talk with your health care provider about your test results, treatment options, and if necessary, the need for more tests. Vaccines  Your health care provider may recommend certain vaccines, such as:  Influenza vaccine. This is recommended every year.  Tetanus, diphtheria, and acellular pertussis (Tdap, Td) vaccine. You may need a Td booster every 10 years.  Zoster  vaccine. You may need this after age 97.  Pneumococcal 13-valent conjugate (PCV13) vaccine. One dose is recommended after age 87.  Pneumococcal polysaccharide (PPSV23) vaccine. One dose is recommended after age 87. Talk to your health care provider about which screenings  and vaccines you need and how often you need them. This information is not intended to replace advice given to you by your health care provider. Make sure you discuss any questions you have with your health care provider. Document Released: 02/28/2015 Document Revised: 10/22/2015 Document Reviewed: 12/03/2014 Elsevier Interactive Patient Education  2017 [place] Prevention in the Home Falls can cause injuries. They can happen to people of all ages. There are many things you can do to make your home safe and to help prevent falls. What can I do on the outside of my home?  Regularly fix the edges of walkways and driveways and fix any cracks.  Remove anything that might make you trip as you walk through a door, such as a raised step or threshold.  Trim any bushes or trees on the path to your home.  Use bright outdoor lighting.  Clear any walking paths of anything that might make someone trip, such as rocks or tools.  Regularly check to see if handrails are loose or broken. Make sure that both sides of any steps have handrails.  Any raised decks and porches should have guardrails on the edges.  Have any leaves, snow, or ice cleared regularly.  Use sand or salt on walking paths during winter.  Clean up any spills in your garage right away. This includes oil or grease spills. What can I do in the bathroom?  Use night lights.  Install grab bars by the toilet and in the tub and shower. Do not use towel bars as grab bars.  Use non-skid mats or decals in the tub or shower.  If you need to sit down in the shower, use a plastic, non-slip stool.  Keep the floor dry. Clean up any water that spills on the floor as soon as it happens.  Remove soap buildup in the tub or shower regularly.  Attach bath mats securely with double-sided non-slip rug tape.  Do not have throw rugs and other things on the floor that can make you trip. What can I do in the bedroom?  Use night lights.   Make sure that you have a light by your bed that is easy to reach.  Do not use any sheets or blankets that are too big for your bed. They should not hang down onto the floor.  Have a firm chair that has side arms. You can use this for support while you get dressed.  Do not have throw rugs and other things on the floor that can make you trip. What can I do in the kitchen?  Clean up any spills right away.  Avoid walking on wet floors.  Keep items that you use a lot in easy-to-reach places.  If you need to reach something above you, use a strong step stool that has a grab bar.  Keep electrical cords out of the way.  Do not use floor polish or wax that makes floors slippery. If you must use wax, use non-skid floor wax.  Do not have throw rugs and other things on the floor that can make you trip. What can I do with my stairs?  Do not leave any items on the stairs.  Make sure that there are handrails  on both sides of the stairs and use them. Fix handrails that are broken or loose. Make sure that handrails are as long as the stairways.  Check any carpeting to make sure that it is firmly attached to the stairs. Fix any carpet that is loose or worn.  Avoid having throw rugs at the top or bottom of the stairs. If you do have throw rugs, attach them to the floor with carpet tape.  Make sure that you have a light switch at the top of the stairs and the bottom of the stairs. If you do not have them, ask someone to add them for you. What else can I do to help prevent falls?  Wear shoes that:  Do not have high heels.  Have rubber bottoms.  Are comfortable and fit you well.  Are closed at the toe. Do not wear sandals.  If you use a stepladder:  Make sure that it is fully opened. Do not climb a closed stepladder.  Make sure that both sides of the stepladder are locked into place.  Ask someone to hold it for you, if possible.  Clearly mark and make sure that you can see:  Any  grab bars or handrails.  First and last steps.  Where the edge of each step is.  Use tools that help you move around (mobility aids) if they are needed. These include:  Canes.  Walkers.  Scooters.  Crutches.  Turn on the lights when you go into a dark area. Replace any light bulbs as soon as they burn out.  Set up your furniture so you have a clear path. Avoid moving your furniture around.  If any of your floors are uneven, fix them.  If there are any pets around you, be aware of where they are.  Review your medicines with your doctor. Some medicines can make you feel dizzy. This can increase your chance of falling. Ask your doctor what other things that you can do to help prevent falls. This information is not intended to replace advice given to you by your health care provider. Make sure you discuss any questions you have with your health care provider. Document Released: 11/28/2008 Document Revised: 07/10/2015 Document Reviewed: 03/08/2014 Elsevier Interactive Patient Education  2017 [employer].

## 2018-08-10 DIAGNOSIS — M1990 Unspecified osteoarthritis, unspecified site: Secondary | ICD-10-CM | POA: Diagnosis not present

## 2018-08-10 DIAGNOSIS — G894 Chronic pain syndrome: Secondary | ICD-10-CM | POA: Diagnosis not present

## 2018-08-10 DIAGNOSIS — Z96659 Presence of unspecified artificial knee joint: Secondary | ICD-10-CM | POA: Diagnosis not present

## 2018-08-14 ENCOUNTER — Other Ambulatory Visit: Payer: Self-pay | Admitting: Family Medicine

## 2018-08-14 ENCOUNTER — Telehealth: Payer: Self-pay | Admitting: Family Medicine

## 2018-08-14 MED ORDER — NOVOLIN 70/30 (70-30) 100 UNIT/ML ~~LOC~~ SUSP: 40.0000 [IU] | Freq: Two times a day (BID) | SUBCUTANEOUS | 11 refills | Status: DC

## 2018-08-14 NOTE — Telephone Encounter (Signed)
Spoke with [name] and she states Ja is in the donut hole. Her insulin is $500 a month. She is out of insulin. Would this patient be an appropriate patient for Relion 70/30 from [place]?

## 2018-08-14 NOTE — Telephone Encounter (Signed)
If she completes forms through health dept [name] is the pharmacist, she will get her insulin free as in the donut hole, please follow through, that will apply for aLL of the medications she takes wgich she is now having difficulty affording, so that is the best option

## 2018-08-14 NOTE — Telephone Encounter (Signed)
Novolin 70/30 has been sent to her pharmacy, 40 units twice daily, same dose as was the lantus, she may buy OTC

## 2018-08-14 NOTE — Telephone Encounter (Signed)
Notified [name] of the insulin change and it is over the counter with verbal understanding.

## 2018-08-14 NOTE — Progress Notes (Signed)
Humulin 70/30

## 2018-08-14 NOTE — Telephone Encounter (Signed)
Pt needs to talk to someone regarding medication --insulin as she is in the donut hole and the insulin is over $500.00

## 2018-08-14 NOTE — Telephone Encounter (Signed)
I have faxed the form and med list to [facility], however, patient lives in [place]. Patient's sister states they have tried to do this before but the patient has too much income. She has not had any insulin for 2 days. She is asking what do you recommend? Insulin is the only thing she takes for diabetes.

## 2018-08-17 ENCOUNTER — Telehealth: Payer: Self-pay

## 2018-08-17 DIAGNOSIS — IMO0001 Reserved for inherently not codable concepts without codable children: Secondary | ICD-10-CM

## 2018-08-17 MED ORDER — "BD VEO INSULIN SYRINGE U/F 31G X 15/64"" 0.5 ML MISC": 2 refills | Status: DC

## 2018-08-17 NOTE — Telephone Encounter (Signed)
Order put in for insulin syringes

## 2018-08-25 ENCOUNTER — Other Ambulatory Visit: Payer: Self-pay | Admitting: Family Medicine

## 2018-09-04 ENCOUNTER — Telehealth: Payer: Self-pay | Admitting: Family Medicine

## 2018-09-04 NOTE — Telephone Encounter (Signed)
[name] requested her novolin be prescribes, stating she only had 12 days of med, record review shows 1 year [facility], pls follow up and let her know , see id f she is aware and what is the request

## 2018-09-05 NOTE — Telephone Encounter (Signed)
Left message on [name]'s vm letting her know there are refills. Encouraged her to reach back out to me if she had any other concerns or requests.

## 2018-09-09 DIAGNOSIS — Z96659 Presence of unspecified artificial knee joint: Secondary | ICD-10-CM | POA: Diagnosis not present

## 2018-09-09 DIAGNOSIS — M1990 Unspecified osteoarthritis, unspecified site: Secondary | ICD-10-CM | POA: Diagnosis not present

## 2018-09-09 DIAGNOSIS — G894 Chronic pain syndrome: Secondary | ICD-10-CM | POA: Diagnosis not present

## 2018-09-26 ENCOUNTER — Telehealth: Payer: Self-pay | Admitting: Family Medicine

## 2018-09-26 NOTE — Telephone Encounter (Signed)
Pt needs a RX for a lift chair sent to --[facility] in [place]. Ph [phone]

## 2018-09-26 NOTE — Telephone Encounter (Signed)
For all medical equip, it requires appt because the dr has to do the office note to state the reason why its needed and it has to be a recent visit. Thanks

## 2018-09-27 ENCOUNTER — Encounter: Payer: Self-pay | Admitting: Family Medicine

## 2018-09-27 ENCOUNTER — Ambulatory Visit (INDEPENDENT_AMBULATORY_CARE_PROVIDER_SITE_OTHER): Payer: Medicare HMO | Admitting: Family Medicine

## 2018-09-27 ENCOUNTER — Other Ambulatory Visit: Payer: Self-pay

## 2018-09-27 ENCOUNTER — Encounter (INDEPENDENT_AMBULATORY_CARE_PROVIDER_SITE_OTHER): Payer: Self-pay

## 2018-09-27 VITALS — BP 140/74 | HR 101 | Temp 98.0°F | Resp 15 | Ht 67.0 in | Wt 233.0 lb

## 2018-09-27 DIAGNOSIS — M159 Polyosteoarthritis, unspecified: Secondary | ICD-10-CM

## 2018-09-27 DIAGNOSIS — E8881 Metabolic syndrome: Secondary | ICD-10-CM

## 2018-09-27 DIAGNOSIS — I10 Essential (primary) hypertension: Secondary | ICD-10-CM | POA: Diagnosis not present

## 2018-09-27 DIAGNOSIS — M545 Low back pain: Secondary | ICD-10-CM | POA: Diagnosis not present

## 2018-09-27 DIAGNOSIS — M25551 Pain in right hip: Secondary | ICD-10-CM | POA: Diagnosis not present

## 2018-09-27 DIAGNOSIS — Z794 Long term (current) use of insulin: Secondary | ICD-10-CM

## 2018-09-27 DIAGNOSIS — M1711 Unilateral primary osteoarthritis, right knee: Secondary | ICD-10-CM | POA: Diagnosis not present

## 2018-09-27 DIAGNOSIS — E1165 Type 2 diabetes mellitus with hyperglycemia: Secondary | ICD-10-CM | POA: Diagnosis not present

## 2018-09-27 DIAGNOSIS — F418 Other specified anxiety disorders: Secondary | ICD-10-CM | POA: Diagnosis not present

## 2018-09-27 DIAGNOSIS — IMO0001 Reserved for inherently not codable concepts without codable children: Secondary | ICD-10-CM

## 2018-09-27 DIAGNOSIS — G8929 Other chronic pain: Secondary | ICD-10-CM

## 2018-09-27 MED ORDER — NYSTATIN 100000 UNIT/GM EX POWD: Freq: Every day | CUTANEOUS | 3 refills | Status: DC | PRN

## 2018-09-27 MED ORDER — NOVOLIN 70/30 (70-30) 100 UNIT/ML ~~LOC~~ SUSP: 40.0000 [IU] | Freq: Two times a day (BID) | SUBCUTANEOUS | 11 refills | Status: DC

## 2018-09-27 MED ORDER — CLOTRIMAZOLE-BETAMETHASONE 1-0.05 % EX CREA: 1.0000 "application " | TOPICAL_CREAM | Freq: Two times a day (BID) | CUTANEOUS | 3 refills | Status: DC

## 2018-09-27 MED ORDER — TEMAZEPAM 30 MG PO CAPS: 30.0000 mg | ORAL_CAPSULE | Freq: Every day | ORAL | 5 refills | Status: DC

## 2018-09-27 MED ORDER — HYDROXYZINE PAMOATE 25 MG PO CAPS: 25.0000 mg | ORAL_CAPSULE | Freq: Three times a day (TID) | ORAL | 3 refills | Status: DC

## 2018-09-27 MED ORDER — OXYCODONE-ACETAMINOPHEN 10-325 MG PO TABS: ORAL_TABLET | ORAL | 0 refills | Status: DC

## 2018-09-27 NOTE — Progress Notes (Signed)
[name]     MRN: [id number]      DOB: 07-26-1940   HPI [name] is here for follow up and re-evaluation of chronic medical conditions, medication management and review of any available recent lab and radiology data.  Here for chronic pain management Preventive health is updated, specifically  Cancer screening and Immunization.   Questions or concerns regarding consultations or procedures which the PT has had in the interim are  addressed. The PT denies any adverse reactions to current medications since the last visit.  Appointment for right knee swelling and pain today Needs lift chair, Physical Therapy has been tried and failed  has failed to enable her to transfer from a chair to a standing position. Once standing, she is able to ambulate with an assistive device. [name] is completely incapable of standing from a regular arm chair or any chair in her home.  [name] does have severe arthritis of the spine and knees.  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 Her current  medication  Affords little  relief and does not enable mobility  Denies polyuria, polydipsia, blurred vision , or hypoglycemic episodes.   ROS Denies recent fever or chills. Denies sinus pressure, nasal congestion, ear pain or sore throat. Denies chest congestion, productive cough or wheezing. Denies chest pains, palpitations and leg swelling Denies abdominal pain, nausea, vomiting,diarrhea or constipation.   Denies dysuria, frequency, hesitancy or incontinence. C/o severe  joint pain, swelling and limitation in mobility.Right knee 1.5 normal size with very limited mobility , has appointment with ortho today Denies headaches, seizures, numbness, or tingling. C/o depression,increased  anxiety and some  insomnia. Denies skin break down or rash.   PE  BP 140/74    Pulse (!) 101    Temp 98 F (36.7 C) (Temporal)    Resp 15    Ht 5\' 7"  (1.702 m)    Wt 233 lb (105.7 kg)    SpO2 96%    BMI 36.49 kg/m   Patient  alert and oriented and in no cardiopulmonary distress.Patient in pain HEENT: No facial asymmetry, EOMI,   oropharynx pink and moist.  Neck decreased ROM no JVD, no mass.  Chest: Clear to auscultation bilaterally.  CVS: S1, S2 no murmurs, no S3.Regular rate.  ABD: Soft non tender.   Ext: No edema  [name]: Markedly reduced  ROM spine, shoulders, hips and knees.  Skin: Intact, no ulcerations or rash noted.  Psych: Good eye contact,  Memory impaired , both anxious and depressed  appearing.  CNS: CN 2-12 intact, grade 4 power in all extremities with reduced tone   Assessment & Plan  Essential hypertension Suboptimal though adequate control based on patient's age and mobility.  No medication change.  Encounter for chronic pain management The patient's Controlled Substance registry is reviewed and compliance confirmed. Adequacy of  Pain control and level of function is assessed. Medication dosing is adjusted as deemed appropriate. Twelve weeks of medication is prescribed , patient signs for the script and is provided with a follow up appointment between 11 to 12 weeks .   Depression with anxiety Uncontrolled anxiety increase hydroxyzine to 3 times daily.  Morbid obesity ([place]) Obesity associated with hypertension and osteoarthritis.  Patient re-educated about  the importance of commitment to  exercise The importance of healthy food choices with portion control discussed, as well as eating regularly and within a 12 hour window most days. The need to choose "clean , green" food 50 to  75% of the time is discussed, as well as to make water the primary drink and set a goal of 64 ounces water daily.    Weight /BMI 09/27/2018 08/09/2018 07/03/2018  WEIGHT 233 lb 230 lb 230 lb 1.9 oz  HEIGHT 5\' 7"  5\' 7"  5\' 7"   BMI 36.49 kg/m2 36.02 kg/m2 36.04 kg/m2      Diabetes mellitus, insulin dependent (IDDM), uncontrolled ([place]) [name] is reminded of the importance of commitment to daily physical  activity for 30 minutes or more, as able and the need to limit carbohydrate intake to 30 to 60 grams per meal to help with blood sugar control.   The need to take medication as prescribed, test blood sugar as directed, and to call between visits if there is a concern that blood sugar is uncontrolled is also discussed.   [name] is reminded of the importance of daily foot exam, annual eye examination, and good blood sugar, blood pressure and cholesterol control. Updated lab needed at/ before next visit.   Diabetic Labs Latest Ref Rng & Units 07/03/2018 03/19/2018 03/18/2018 03/17/2018 03/15/2018  HbA1c <5.7 % of total Hgb 8.0(H) - - - -  Microalbumin mg/dL - - - - -  Micro/Creat Ratio <30 mcg/mg creat - - - - -  Chol <200 mg/dL 119 - - - -  HDL > OR = 50 mg/dL 49(L) - - - -  Calc LDL mg/dL (calc) 47 - - - -  Triglycerides <150 mg/dL 144 - - - -  Creatinine 0.60 - 0.93 mg/dL 1.45(H) 1.09(H) 1.11(H) 1.29(H) 1.19(H)   BP/Weight 09/27/2018 08/09/2018 07/03/2018 04/10/2018 03/21/2018 03/20/2018 8/56/3149  Systolic BP 702 637 858 850 277 412 -  Diastolic BP 74 62 62 80 63 77 -  Wt. 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 Patient is incapable of standing up from a regular arm chair or any chair in her home.  She requires a  seat lift mechanism/lift chair to facilitate this.  Once standing [name] is able to ambulate with an  assistive device.  All therapeutic modalities to enable her to transfer from a chair to a standing position have been tried and have failed this includes physical therapy.

## 2018-09-27 NOTE — Patient Instructions (Addendum)
F/U ijn 12 weeks, call if you need me before  CBc, chem 7 and EGFr, hBA1C, non fasting 8/19 or shortly after, co ordinate with flu vaccine  Nurse please print insulin script and fax to [facility] , [facility] , looking for better option   Hydroxyzine three times / day  Lift chair documentation will be completed and request sent to [place] will help yoour right knee today

## 2018-10-02 ENCOUNTER — Telehealth: Payer: Self-pay | Admitting: *Deleted

## 2018-10-02 ENCOUNTER — Ambulatory Visit: Payer: Medicare HMO | Admitting: Family Medicine

## 2018-10-02 NOTE — Telephone Encounter (Signed)
[name] (on [place]) called about pts lift chair. They were seen in the office to be evaluated for one and the paper work was supposed to be faxed to [facility] in [place]. They said they had still not heard back about her lift chair and wanted to make sure it was sent to the right pharmacy

## 2018-10-02 NOTE — Telephone Encounter (Signed)
Waiting on the OV note to be closed and then will fax along with rx and demographics to [facility]

## 2018-10-04 ENCOUNTER — Other Ambulatory Visit: Payer: Self-pay

## 2018-10-04 DIAGNOSIS — M5441 Lumbago with sciatica, right side: Secondary | ICD-10-CM

## 2018-10-04 DIAGNOSIS — G8929 Other chronic pain: Secondary | ICD-10-CM

## 2018-10-04 NOTE — Telephone Encounter (Signed)
Faxed rx and demographics but OV not complete

## 2018-10-06 ENCOUNTER — Encounter: Payer: Self-pay | Admitting: Family Medicine

## 2018-10-06 MED ORDER — OXYCODONE-ACETAMINOPHEN 10-325 MG PO TABS: ORAL_TABLET | ORAL | 0 refills | Status: DC

## 2018-10-06 NOTE — Assessment & Plan Note (Signed)
Obesity associated with hypertension and osteoarthritis.  Patient re-educated about  the importance of commitment to  exercise The importance of healthy food choices with portion control discussed, as well as eating regularly and within a 12 hour window most days. The need to choose "clean , green" food 50 to 75% of the time is discussed, as well as to make water the primary drink and set a goal of 64 ounces water daily.    Weight /BMI 09/27/2018 08/09/2018 07/03/2018  WEIGHT 233 lb 230 lb 230 lb 1.9 oz  HEIGHT 5\' 7"  5\' 7"  5\' 7"   BMI 36.49 kg/m2 36.02 kg/m2 36.04 kg/m2

## 2018-10-06 NOTE — Assessment & Plan Note (Signed)
The patient's Controlled Substance registry is reviewed and compliance confirmed. Adequacy of  Pain control and level of function is assessed. Medication dosing is adjusted as deemed appropriate. Twelve weeks of medication is prescribed , patient signs for the script and is provided with a follow up appointment between 11 to 12 weeks .  

## 2018-10-06 NOTE — Telephone Encounter (Signed)
[name], the office note ois now complete, please fax this to [facility] today,  so she can get the lift chair, thank you. Any questions/ concerns , please ask

## 2018-10-06 NOTE — Assessment & Plan Note (Signed)
[name] is reminded of the importance of commitment to daily physical activity for 30 minutes or more, as able and the need to limit carbohydrate intake to 30 to 60 grams per meal to help with blood sugar control.   The need to take medication as prescribed, test blood sugar as directed, and to call between visits if there is a concern that blood sugar is uncontrolled is also discussed.   [name] is reminded of the importance of daily foot exam, annual eye examination, and good blood sugar, blood pressure and cholesterol control. Updated lab needed at/ before next visit.   Diabetic Labs Latest Ref Rng & Units 07/03/2018 03/19/2018 03/18/2018 03/17/2018 03/15/2018  HbA1c <5.7 % of total Hgb 8.0(H) - - - -  Microalbumin mg/dL - - - - -  Micro/Creat Ratio <30 mcg/mg creat - - - - -  Chol <200 mg/dL 119 - - - -  HDL > OR = 50 mg/dL 49(L) - - - -  Calc LDL mg/dL (calc) 47 - - - -  Triglycerides <150 mg/dL 144 - - - -  Creatinine 0.60 - 0.93 mg/dL 1.45(H) 1.09(H) 1.11(H) 1.29(H) 1.19(H)   BP/Weight 09/27/2018 08/09/2018 07/03/2018 04/10/2018 03/21/2018 03/20/2018 0000000  Systolic BP [id number] 123456 -  Diastolic BP 74 62 62 80 63 77 -  Wt. (Lbs) 233 230 230.12 231 245 - 232.59  BMI 36.49 36.02 36.04 37.28 39.54 - 37.54   Foot/eye exam completion dates Latest Ref Rng & Units 08/10/2017 07/14/2016  Eye Exam No Retinopathy - -  Foot exam Order - - -  Foot Form Completion - Done Done

## 2018-10-06 NOTE — Assessment & Plan Note (Signed)
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 Patient is incapable of standing up from a regular arm chair or any chair in her home.  She requires a  seat lift mechanism/lift chair to facilitate this.  Once standing Ms. Sroka is able to ambulate with an  assistive device.  All therapeutic modalities to enable her to transfer from a chair to a standing position have been tried and have failed this includes physical therapy.

## 2018-10-06 NOTE — Assessment & Plan Note (Signed)
Uncontrolled anxiety increase hydroxyzine to 3 times daily.

## 2018-10-06 NOTE — Assessment & Plan Note (Signed)
Suboptimal though adequate control based on patient's age and mobility.  No medication change.

## 2018-10-10 DIAGNOSIS — Z96659 Presence of unspecified artificial knee joint: Secondary | ICD-10-CM | POA: Diagnosis not present

## 2018-10-10 DIAGNOSIS — M1990 Unspecified osteoarthritis, unspecified site: Secondary | ICD-10-CM | POA: Diagnosis not present

## 2018-10-10 DIAGNOSIS — G894 Chronic pain syndrome: Secondary | ICD-10-CM | POA: Diagnosis not present

## 2018-10-22 ENCOUNTER — Other Ambulatory Visit: Payer: Self-pay | Admitting: Family Medicine

## 2018-11-07 ENCOUNTER — Encounter: Payer: Self-pay | Admitting: *Deleted

## 2018-11-10 ENCOUNTER — Other Ambulatory Visit: Payer: Self-pay

## 2018-11-10 ENCOUNTER — Emergency Department (HOSPITAL_COMMUNITY): Payer: Medicare HMO

## 2018-11-10 ENCOUNTER — Encounter (HOSPITAL_COMMUNITY): Payer: Self-pay | Admitting: *Deleted

## 2018-11-10 ENCOUNTER — Observation Stay (HOSPITAL_COMMUNITY)
Admission: EM | Admit: 2018-11-10 | Discharge: 2018-11-12 | Disposition: A | Discharge status: Home / Self Care | Payer: Medicare HMO | Attending: Family Medicine | Admitting: Family Medicine

## 2018-11-10 DIAGNOSIS — Z96659 Presence of unspecified artificial knee joint: Secondary | ICD-10-CM | POA: Diagnosis not present

## 2018-11-10 DIAGNOSIS — N3001 Acute cystitis with hematuria: Secondary | ICD-10-CM

## 2018-11-10 DIAGNOSIS — R079 Chest pain, unspecified: Secondary | ICD-10-CM | POA: Diagnosis present

## 2018-11-10 DIAGNOSIS — Z79899 Other long term (current) drug therapy: Secondary | ICD-10-CM | POA: Diagnosis not present

## 2018-11-10 DIAGNOSIS — I4891 Unspecified atrial fibrillation: Secondary | ICD-10-CM | POA: Insufficient documentation

## 2018-11-10 DIAGNOSIS — Z794 Long term (current) use of insulin: Secondary | ICD-10-CM | POA: Insufficient documentation

## 2018-11-10 DIAGNOSIS — N390 Urinary tract infection, site not specified: Secondary | ICD-10-CM | POA: Insufficient documentation

## 2018-11-10 DIAGNOSIS — F0390 Unspecified dementia without behavioral disturbance: Secondary | ICD-10-CM | POA: Diagnosis not present

## 2018-11-10 DIAGNOSIS — E119 Type 2 diabetes mellitus without complications: Secondary | ICD-10-CM | POA: Diagnosis not present

## 2018-11-10 DIAGNOSIS — R0602 Shortness of breath: Secondary | ICD-10-CM | POA: Diagnosis not present

## 2018-11-10 DIAGNOSIS — D509 Iron deficiency anemia, unspecified: Secondary | ICD-10-CM | POA: Diagnosis not present

## 2018-11-10 DIAGNOSIS — Z20828 Contact with and (suspected) exposure to other viral communicable diseases: Secondary | ICD-10-CM | POA: Insufficient documentation

## 2018-11-10 DIAGNOSIS — R05 Cough: Secondary | ICD-10-CM | POA: Diagnosis not present

## 2018-11-10 DIAGNOSIS — R0789 Other chest pain: Principal | ICD-10-CM | POA: Insufficient documentation

## 2018-11-10 DIAGNOSIS — I10 Essential (primary) hypertension: Secondary | ICD-10-CM | POA: Diagnosis present

## 2018-11-10 DIAGNOSIS — Z96652 Presence of left artificial knee joint: Secondary | ICD-10-CM | POA: Insufficient documentation

## 2018-11-10 DIAGNOSIS — G43109 Migraine with aura, not intractable, without status migrainosus: Secondary | ICD-10-CM

## 2018-11-10 DIAGNOSIS — M1990 Unspecified osteoarthritis, unspecified site: Secondary | ICD-10-CM | POA: Diagnosis not present

## 2018-11-10 DIAGNOSIS — E785 Hyperlipidemia, unspecified: Secondary | ICD-10-CM | POA: Diagnosis present

## 2018-11-10 DIAGNOSIS — G894 Chronic pain syndrome: Secondary | ICD-10-CM | POA: Diagnosis not present

## 2018-11-10 DIAGNOSIS — G43909 Migraine, unspecified, not intractable, without status migrainosus: Secondary | ICD-10-CM | POA: Diagnosis present

## 2018-11-10 LAB — URINALYSIS, ROUTINE W REFLEX MICROSCOPIC
Bilirubin Urine: NEGATIVE
Glucose, UA: NEGATIVE mg/dL
Ketones, ur: NEGATIVE mg/dL
Nitrite: NEGATIVE
Protein, ur: 30 mg/dL — AB
Specific Gravity, Urine: 1.015 (ref 1.005–1.030)
WBC, UA: >50 WBC/hpf — ABNORMAL HIGH (ref 0–5)
pH: 5 (ref 5.0–8.0)

## 2018-11-10 LAB — BASIC METABOLIC PANEL
Anion gap: 8 (ref 5–15)
BUN: 21 mg/dL (ref 8–23)
CO2: 23 mmol/L (ref 22–32)
Calcium: 8.8 mg/dL — ABNORMAL LOW (ref 8.9–10.3)
Chloride: 107 mmol/L (ref 98–111)
Creatinine, Ser: 1.21 mg/dL — ABNORMAL HIGH (ref 0.44–1.00)
GFR calc Af Amer: 49 mL/min — ABNORMAL LOW (ref >60)
GFR calc non Af Amer: 43 mL/min — ABNORMAL LOW (ref >60)
Glucose, Bld: 138 mg/dL — ABNORMAL HIGH (ref 70–99)
Potassium: 4 mmol/L (ref 3.5–5.1)
Sodium: 138 mmol/L (ref 135–145)

## 2018-11-10 LAB — CBC
HCT: 29.7 % — ABNORMAL LOW (ref 36.0–46.0)
Hemoglobin: 8.1 g/dL — ABNORMAL LOW (ref 12.0–15.0)
MCH: 20.9 pg — ABNORMAL LOW (ref 26.0–34.0)
MCHC: 27.3 g/dL — ABNORMAL LOW (ref 30.0–36.0)
MCV: 76.7 fL — ABNORMAL LOW (ref 80.0–100.0)
Platelets: 624 10*3/uL — ABNORMAL HIGH (ref 150–400)
RBC: 3.87 MIL/uL (ref 3.87–5.11)
RDW: 19.8 % — ABNORMAL HIGH (ref 11.5–15.5)
WBC: 10.6 10*3/uL — ABNORMAL HIGH (ref 4.0–10.5)
nRBC: 0.2 % (ref 0.0–0.2)

## 2018-11-10 LAB — HEPATIC FUNCTION PANEL
ALT: 9 U/L (ref 0–44)
AST: 9 U/L — ABNORMAL LOW (ref 15–41)
Albumin: 3.4 g/dL — ABNORMAL LOW (ref 3.5–5.0)
Alkaline Phosphatase: 95 U/L (ref 38–126)
Bilirubin, Direct: <0.1 mg/dL (ref 0.0–0.2)
Total Bilirubin: 0.1 mg/dL — ABNORMAL LOW (ref 0.3–1.2)
Total Protein: 8 g/dL (ref 6.5–8.1)

## 2018-11-10 LAB — TROPONIN I (HIGH SENSITIVITY)
Troponin I (High Sensitivity): 7 ng/L (ref <18)
Troponin I (High Sensitivity): 8 ng/L (ref <18)

## 2018-11-10 MED ORDER — CIPROFLOXACIN IN D5W 400 MG/200ML IV SOLN
400.0000 mg | Freq: Once | INTRAVENOUS | Status: AC
  Administered 2018-11-10: 21:00:00 400 mg via INTRAVENOUS
  Filled 2018-11-10: qty 200

## 2018-11-10 MED ORDER — ONDANSETRON HCL 4 MG/2ML IJ SOLN
4.0000 mg | Freq: Four times a day (QID) | INTRAMUSCULAR | Status: DC | PRN
  Administered 2018-11-10: 23:00:00 4 mg via INTRAVENOUS
  Filled 2018-11-10: qty 2

## 2018-11-10 MED ORDER — ACETAMINOPHEN 325 MG PO TABS
650.0000 mg | ORAL_TABLET | Freq: Four times a day (QID) | ORAL | Status: DC | PRN
  Administered 2018-11-11: 06:00:00 650 mg via ORAL
  Filled 2018-11-10: qty 2

## 2018-11-10 MED ORDER — HYDROMORPHONE HCL 1 MG/ML IJ SOLN
0.5000 mg | Freq: Once | INTRAMUSCULAR | Status: AC
  Administered 2018-11-10: 21:00:00 0.5 mg via INTRAVENOUS
  Filled 2018-11-10: qty 1

## 2018-11-10 MED ORDER — ONDANSETRON HCL 4 MG/2ML IJ SOLN
4.0000 mg | Freq: Once | INTRAMUSCULAR | Status: AC
  Administered 2018-11-10: 4 mg via INTRAVENOUS

## 2018-11-10 MED ORDER — ACETAMINOPHEN 650 MG RE SUPP: 650.0000 mg | Freq: Four times a day (QID) | RECTAL | Status: DC | PRN

## 2018-11-10 MED ORDER — SODIUM CHLORIDE 0.9 % IV SOLN: 250.0000 mL | INTRAVENOUS | Status: DC | PRN

## 2018-11-10 MED ORDER — IOHEXOL 350 MG/ML SOLN
75.0000 mL | Freq: Once | INTRAVENOUS | Status: AC | PRN
  Administered 2018-11-10: 21:00:00 75 mL via INTRAVENOUS

## 2018-11-10 MED ORDER — SODIUM CHLORIDE 0.9% FLUSH: 3.0000 mL | INTRAVENOUS | Status: DC | PRN

## 2018-11-10 NOTE — ED Triage Notes (Signed)
Pt c/o left sided chest pain, SOB, dizziness, nausea that started this morning. Pt also c/o dysuria for a few days. Pt reports dry cough. Pt has dementia but can answer some questions.  Family reports pt c/o left sided chest pain intermittently for a few days, but worse today and when they brought her inside the ED she got very nauseated but did not vomit. Family reports temp of 101 last Friday, but none since, dysuria, hematuria and headache.

## 2018-11-10 NOTE — ED Provider Notes (Signed)
[facility] EMERGENCY DEPARTMENT Provider Note   CSN: [id number] Arrival date & time: 11/10/18  1154     History   Chief Complaint Chief Complaint  Patient presents with   Chest Pain    HPI [name] is a 78 y.o. female with a history of a fib and prior h/o of pulmonary embolism on Pradaxa, chronic anemia, DM, GERD, migraines, mild dementia and h/o pneumonia and complaint of dysuria and suspected hematuria x 1 week.  Niece who is a caregiver at bedside, presents with a picture of blood tinged toilet bowel which occurred one week ago, but she had a bm as well so was unclear whether the blood was from bladder or bowel,  However, no bleeding since.  She denies rectal pain or constipation.  She has developed left sided sharp chest pain for the past 2 days, worse today in addition to increased sob, pain is worsened with deep inspiration, in addition to non productive cough. She had fever of 101 one week ago, none since.  She also describes nausea with near emesis just prior to arrival.  She does have pain in her right calf, denies increased swelling (has some at baseline).  She has not missed any doses of her Pradaxa.      The history is provided by the patient and a relative.    Past Medical History:  Diagnosis Date   Anemia, iron deficiency 2012   Evaluated by Dr. Oneida Alar; H&H of 9.3/30.8 with MCV-79 in 10/2010; 3/3 positive Hemoccult cards in 07/2011   Anxiety    was on Prozac for a couple of weeks   Atrial fibrillation ([place])    takes Coumadin daily   Bruises easily    takes Coumadin   Chronic anticoagulation 07/21/2010   Chronic back pain    related to knee pain   Degenerative joint disease    Knees   Dementia ([place])    Depression    Diabetes mellitus    takes Metformin daily   Gastroesophageal reflux disease    takes Omeprazole daily   Glaucoma    Glaucoma    uses eye drops at night   History of blood transfusion 1969   History of gout    was on  medication but taken off of several months ago   Hx of colonic polyps    Hx of migraines    last one about a yr ago;takes Topamax daily   Hyperlipidemia    takes Pravastatin daily   Hypertension    takes Hyzaar daily and Propranolol    Insomnia    takes Restoril prn   Joint pain    Joint swelling    Memory loss    takes donepezil   Migraine    Migraine    Nocturia    Overweight(278.02)    Pneumonia 1969   hx of   Pulmonary embolism ([place]) May 2012   acute presentation, bilateral PE   Skin spots-aging    Urinary frequency     Patient Active Problem List   Diagnosis Date Noted   Chest pain 11/10/2018   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   History of pulmonary embolism 01/25/2018   Encounter for chronic pain management 07/17/2016   Chronic pain syndrome 02/03/2016   Bilateral knee pain 04/23/2014   Diabetes mellitus, insulin dependent (IDDM), uncontrolled ([place]) 11/15/2013   Right-sided low back pain with right-sided sciatica 07/02/2013   Seasonal allergies 04/11/2013   Urinary incontinence 33/35/4562   Metabolic syndrome X 56/38/9373  Chronic anticoagulation 12/22/2012   Vitamin D deficiency 11/05/2012   Memory loss 11/01/2012   Depression with anxiety 11/01/2012   Anemia, iron deficiency    Morbid obesity ([place]) 08/16/2008   Dyslipidemia 05/30/2007   Migraine 05/30/2007   Essential hypertension 05/30/2007   Gastroesophageal reflux disease 05/30/2007    Past Surgical History:  Procedure Laterality Date   CATARACT EXTRACTION W/PHACO Right 04/11/2012   Procedure: CATARACT EXTRACTION PHACO AND INTRAOCULAR LENS PLACEMENT ([place]);  Surgeon: Elta Guadeloupe T. Gershon Crane, MD;  Location: [facility];  Service: Ophthalmology;  Laterality: Right;  CDE=9.61   CATARACT EXTRACTION W/PHACO Left 12/26/2012   Procedure: CATARACT EXTRACTION PHACO AND INTRAOCULAR LENS PLACEMENT ([facility]);  Surgeon: Elta Guadeloupe T. Gershon Crane, MD;  Location: [facility];  Service: Ophthalmology;   Laterality: Left;  CDE:7.74   COLONOSCOPY  Jan 2002; 2012   2002: Dr. Tamala Julian, ext. hemorrhoids, nl colon; 2012-adenomatous polyps, gastritis on EGD   CYSTOSCOPY W/ URETERAL STENT PLACEMENT Bilateral 02/25/2018   Procedure: CYSTOSCOPY WITH BILATERAL RETROGRADE PYELOGRAM/BILATERAL URETERAL STENT PLACEMENT, BLADDER BIOPSIES WITH FULGERATION;  Surgeon: [name], MD;  Location: [facility];  Service: Urology;  Laterality: Bilateral;   CYSTOSCOPY/URETEROSCOPY/HOLMIUM LASER/STENT PLACEMENT Bilateral 03/21/2018   Procedure: CYSTOSCOPY/URETEROSCOPY/HOLMIUM LASER/STENT PLACEMENT;  Surgeon: [name], MD;  Location: [facility];  Service: Urology;  Laterality: Bilateral;  ONLY NEEDS 60 MIN   DILATION AND CURETTAGE OF UTERUS     ESOPHAGOGASTRODUODENOSCOPY  08/24/2011   ESOPHAGOGASTRODUODENOSCOPY; esophageal dilatation; Rogene Houston, MD;   GIVENS CAPSULE STUDY  08/18/2011   Procedure: GIVENS CAPSULE STUDY;  Surgeon: Rogene Houston, MD;  Location: [facility];  Service: Endoscopy;  Laterality: N/A;  730   KNEE ARTHROSCOPY W/ MENISCECTOMY  90's   Left   ORIF ANKLE FRACTURE Right 90's   TONSILLECTOMY     TOTAL KNEE ARTHROPLASTY  10/20/2011   Procedure: TOTAL KNEE ARTHROPLASTY;  Surgeon: Ninetta Lights, MD;  Location: [facility];  Service: Orthopedics;  Laterality: Left;   UPPER GASTROINTESTINAL ENDOSCOPY     URETHRAL DILATION       OB History   No obstetric history on file.      Home Medications    Prior to Admission medications   Medication Sig Start Date End Date Taking? Authorizing Provider  acetaminophen (TYLENOL) 500 MG tablet Take one tablet two times daily for chronic pain 07/03/18  Yes [name], MD  albuterol (VENTOLIN HFA) 108 (90 Base) MCG/ACT inhaler Inhale 2 puffs into the lungs every 6 (six) hours as needed for wheezing or shortness of breath. 02/27/18  Yes Emokpae, Courage, MD  amLODipine (NORVASC) 10 MG tablet Take 1 tablet (10 mg total) by  mouth daily. 02/27/18  Yes Emokpae, Courage, MD  atorvastatin (LIPITOR) 40 MG tablet TAKE 1 TABLET BY MOUTH EVERY DAY 08/29/18  Yes [name], MD  busPIRone (BUSPAR) 7.5 MG tablet TAKE 1 TABLET BY MOUTH 3 TIMES DAILY Patient taking differently: Take 7.5 mg by mouth 3 (three) times daily.  12/23/17  Yes [name], MD  cloNIDine (CATAPRES) 0.2 MG tablet Take 1 tablet (0.2 mg total) by mouth at bedtime. 04/10/18  Yes [name], MD  clotrimazole-betamethasone (LOTRISONE) cream Apply 1 application topically 2 (two) times daily. D/c vusion ointment 09/27/18  Yes [name], MD  colchicine 0.6 MG tablet Take 1 tablet (0.6 mg total) by mouth daily. May decrease to once per day as needed for diarrhea 06/27/17  Yes Tanna Furry, MD  COMBIGAN 0.2-0.5 % ophthalmic solution Place  1 drop into both eyes 2 (two) times daily. 03/23/17  Yes [provider]  dabigatran (PRADAXA) 150 MG CAPS capsule Take 1 capsule (150 mg total) by mouth 2 (two) times daily. 02/27/18  Yes Emokpae, Courage, MD  donepezil (ARICEPT) 10 MG tablet Take 1 tablet (10 mg total) by mouth at bedtime. 10/22/18  Yes Perlie Mayo, NP  Fluoxetine HCl, PMDD, 10 MG TABS Take 1 tablet (10 mg total) by mouth daily. 01/27/18  Yes Johnson, Clanford L, MD  hydrOXYzine (VISTARIL) 25 MG capsule Take 1 capsule (25 mg total) by mouth 3 (three) times daily. 09/27/18  Yes [name], MD  imipramine (TOFRANIL) 50 MG tablet TAKE 2 TABLETS (100 MG TOTAL) BY MOUTH AT BEDTIME. 04/10/18  Yes [name], MD  insulin NPH-regular Human (NOVOLIN 70/30) (70-30) 100 UNIT/ML injection Inject 40 Units into the skin 2 (two) times daily with a meal. 09/27/18  Yes [name], MD  meclizine (ANTIVERT) 25 MG tablet TAKE 1 TABLET THREE TIMES DAILY AS NEEDED FOR DIZZINESS. 08/07/18  Yes [name], MD  montelukast (SINGULAIR) 10 MG tablet TAKE 1 TABLET BY MOUTH EVERYDAY AT BEDTIME 10/22/18  Yes Perlie Mayo, NP    nystatin (MYCOSTATIN/NYSTOP) powder Apply topically daily as needed (for rash under breasts). 09/27/18  Yes [name], MD  omeprazole (PRILOSEC) 40 MG capsule TAKE 1 CAPSULE BY MOUTH TWICE A DAY 07/20/18  Yes [name], MD  senna-docusate (SENOKOT-S) 8.6-50 MG tablet Take 2 tablets by mouth at bedtime. Patient taking differently: Take 2 tablets by mouth 2 (two) times daily.  02/27/18 02/27/19 Yes Emokpae, Courage, MD  SUMAtriptan (IMITREX) 5 MG/ACT nasal spray Place 1 spray into the nose every 2 (two) hours as needed for migraine.  01/16/17  Yes [provider]  temazepam (RESTORIL) 30 MG capsule Take 1 capsule (30 mg total) by mouth at bedtime. 10/03/18  Yes [name], MD  topiramate (TOPAMAX) 100 MG tablet TAKE 1 TABLET BY MOUTH TWICE A DAY 05/15/18  Yes [name], MD  Vitamin D, Ergocalciferol, (DRISDOL) 50000 units CAPS capsule TAKE 1 CAPSULE (50,000 UNITS TOTAL) BY MOUTH ONCE A WEEK. Patient taking differently: Take 50,000 Units by mouth every 7 (seven) days. Saturday 11/18/17  Yes [name], MD  ACCU-CHEK AVIVA PLUS test strip USE TO TEST BLOOD SUAGR 2 TIMES A DAY 08/01/18   [name], MD  Blood Glucose Monitoring Suppl (ACCU-CHEK AVIVA PLUS) w/Device KIT For once daily testing dx E11.9 11/24/15   [name], MD  conjugated estrogens (PREMARIN) vaginal cream Place vaginally 2 (two) times a week. Apply sparingly ( fingertip)  Twice weekly to vaginal area on Wednesdays and Fridays 01/30/18   Irwin Brakeman L, MD  docusate sodium (COLACE) 100 MG capsule Take 1 capsule (100 mg total) by mouth daily as needed for mild constipation. 03/20/18   Hosie Poisson, MD  Insulin Syringe-Needle U-100 (BD VEO INSULIN SYRINGE U/F) 31G X 15/64" 0.5 ML MISC Use to inject Novolin twice daily 08/17/18   [name], MD  olopatadine (PATANOL) 0.1 % ophthalmic solution Place 1 drop into both eyes 2 (two) times daily. 01/27/18   Johnson, Clanford  L, MD  oxyCODONE-acetaminophen (PERCOCET) 10-325 MG tablet Take 1 tablet 2 times daily by mouth for chronic pain. 11/20/18 12/20/18  [name], MD  polyethylene glycol (MIRALAX / Floria Raveling) packet Take 17 g by mouth daily. 03/21/18   Hosie Poisson, MD  Family History Family History  Problem Relation Age of Onset   Diabetes Mother    Hypertension Mother    Arthritis Mother    Heart failure Mother    Migraines Mother    Kidney failure Mother    Leukemia Father    Migraines Father    Kidney failure Father    Diabetes Sister    Hypertension Sister    Diabetes Brother    Hypertension Brother    Hypertension Brother    Hypertension Brother    Hypertension Brother    Diabetes Brother    Diabetes Brother    Diabetes Brother    Pulmonary embolism Sister    Migraines Sister    Kidney failure Brother    Colon cancer Neg Hx    Colon polyps Neg Hx     Social History Social History   Tobacco Use   Smoking status: Never Smoker   Smokeless tobacco: Never Used  Substance Use Topics   Alcohol use: No   Drug use: No     Allergies   Penicillins, Fentanyl, Sulfonamide derivatives, and Codeine   Review of Systems Review of Systems  Constitutional: Positive for fever.  HENT: Negative for congestion and sore throat.   Eyes: Negative.   Respiratory: Positive for cough and shortness of breath. Negative for chest tightness.   Cardiovascular: Positive for chest pain and leg swelling. Negative for palpitations.  Gastrointestinal: Positive for nausea. Negative for abdominal pain and vomiting.  Genitourinary: Positive for dysuria and hematuria.  Musculoskeletal: Negative for arthralgias, joint swelling and neck pain.  Skin: Negative.  Negative for rash and wound.  Neurological: Negative for dizziness, weakness, light-headedness, numbness and headaches.  Psychiatric/Behavioral: Negative.      Physical Exam Updated Vital Signs BP (!) 167/107     Pulse (!) 118    Temp 98.2 F (36.8 C) (Oral)    Resp (!) 23    Ht 5' 7" (1.702 m)    Wt 104.3 kg    SpO2 92%    BMI 36.02 kg/m   Physical Exam Vitals signs and nursing note reviewed.  Constitutional:      Appearance: She is well-developed.  HENT:     Head: Normocephalic and atraumatic.  Eyes:     Conjunctiva/sclera: Conjunctivae normal.  Neck:     Musculoskeletal: Normal range of motion.  Cardiovascular:     Rate and Rhythm: Regular rhythm. Tachycardia present.     Heart sounds: Normal heart sounds.  Pulmonary:     Effort: Pulmonary effort is normal. No tachypnea.     Breath sounds: Normal breath sounds. No decreased breath sounds, wheezing or rhonchi.  Abdominal:     General: Bowel sounds are normal.     Palpations: Abdomen is soft.     Tenderness: There is no abdominal tenderness.  Musculoskeletal: Normal range of motion.     Right lower leg: Edema present.     Left lower leg: Edema present.     Comments: Trace edema bilateral ankles.  ttp right calf, soft, no induration, no cords.  Dorsalis pedal pulses full. Bevelyn Buckles' negative.  Skin:    General: Skin is warm and dry.  Neurological:     Mental Status: She is alert.      ED Treatments / Results  Labs (all labs ordered are listed, but only abnormal results are displayed) Labs Reviewed  BASIC METABOLIC PANEL - Abnormal; Notable for the following components:      Result Value   Glucose, Bld 138 (*)  Creatinine, Ser 1.21 (*)    Calcium 8.8 (*)    GFR calc non Af Amer 43 (*)    GFR calc Af Amer 49 (*)    All other components within normal limits  CBC - Abnormal; Notable for the following components:   WBC 10.6 (*)    Hemoglobin 8.1 (*)    HCT 29.7 (*)    MCV 76.7 (*)    MCH 20.9 (*)    MCHC 27.3 (*)    RDW 19.8 (*)    Platelets 624 (*)    All other components within normal limits  URINALYSIS, ROUTINE W REFLEX MICROSCOPIC - Abnormal; Notable for the following components:   APPearance CLOUDY (*)    Hgb urine  dipstick SMALL (*)    Protein, ur 30 (*)    Leukocytes,Ua LARGE (*)    WBC, UA >50 (*)    Bacteria, UA MANY (*)    All other components within normal limits  HEPATIC FUNCTION PANEL - Abnormal; Notable for the following components:   Albumin 3.4 (*)    AST 9 (*)    Total Bilirubin 0.1 (*)    All other components within normal limits  SARS CORONAVIRUS 2 (TAT 6-24 HRS)  SARS CORONAVIRUS 2 (HOSPITAL ORDER, [facility])  OCCULT BLOOD X 1 CARD TO LAB, STOOL  COMPREHENSIVE METABOLIC PANEL  CBC  TROPONIN I (HIGH SENSITIVITY)  TROPONIN I (HIGH SENSITIVITY)    EKG EKG Interpretation  Date/Time:  Friday November 10 2018 12:05:26 EDT Ventricular Rate:  113 PR Interval:  170 QRS Duration: 82 QT Interval:  354 QTC Calculation: 485 R Axis:   11 Text Interpretation:  Sinus tachycardia Otherwise normal ECG Confirmed by Milton Ferguson [phone]) on 11/10/2018 10:57:24 PM   Radiology Dg Chest 2 View  Result Date: 11/10/2018 CLINICAL DATA:  Cough, shortness of breath, and chest pain for 1 month, history hypertension, diabetes mellitus, atrial fibrillation, pulmonary embolism. EXAM: CHEST - 2 VIEW COMPARISON:  03/17/2018 FINDINGS: Upper normal heart size. Calcified tortuous aorta. Mediastinal contours and pulmonary vascularity otherwise normal. Mild elevation of LEFT diaphragm. Lungs clear. No infiltrate, pleural effusion or pneumothorax. Bones unremarkable. IMPRESSION: No acute abnormalities. Electronically Signed   By: Lavonia Dana M.D.   On: 11/10/2018 13:12   Ct Angio Chest Pe W And/or Wo Contrast  Result Date: 11/10/2018 CLINICAL DATA:  PE suspected, high pretest prob. Left-sided chest pain and shortness of breath. EXAM: CT ANGIOGRAPHY CHEST WITH CONTRAST TECHNIQUE: Multidetector CT imaging of the chest was performed using the standard protocol during bolus administration of intravenous contrast. Multiplanar CT image reconstructions and MIPs were obtained to evaluate the  vascular anatomy. CONTRAST:  25m OMNIPAQUE IOHEXOL 350 MG/ML SOLN COMPARISON:  Radiograph earlier this day. Chest CT [id number]: Cardiovascular: There are no filling defects within the pulmonary arteries to suggest pulmonary embolus. The thoracic aorta is normal in caliber without dissection. Mild aortic tortuosity and atherosclerosis. Borderline cardiomegaly. No pericardial effusion. Mediastinum/Nodes: No enlarged mediastinal or hilar lymph nodes. Small hiatal hernia, esophagus is otherwise decompressed. No visualized thyroid nodule. Lungs/Pleura: Calcified granuloma in the right upper lobe. No focal airspace disease. No pleural fluid or pulmonary edema. Mild breathing motion artifact in the lung bases. Trachea and mainstem bronchi are patent. Upper Abdomen: No acute findings. Musculoskeletal: There are no acute or suspicious osseous abnormalities. Review of the MIP images confirms the above findings. IMPRESSION: No pulmonary embolus or acute intrathoracic abnormality. Aortic Atherosclerosis (ICD10-I70.0). Electronically Signed   By:  Keith Rake M.D.   On: 11/10/2018 21:50    Procedures Procedures (including critical care time)  Medications Ordered in ED Medications  sodium chloride flush (NS) 0.9 % injection 3 mL (has no administration in time range)  0.9 %  sodium chloride infusion (has no administration in time range)  acetaminophen (TYLENOL) tablet 650 mg (has no administration in time range)    Or  acetaminophen (TYLENOL) suppository 650 mg (has no administration in time range)  ondansetron (ZOFRAN) injection 4 mg (4 mg Intravenous Given 11/10/18 2302)  ondansetron (ZOFRAN) injection 4 mg (4 mg Intravenous Given by Other 11/10/18 1918)  iohexol (OMNIPAQUE) 350 MG/ML injection 75 mL (75 mLs Intravenous Contrast Given 11/10/18 2129)  ciprofloxacin (CIPRO) IVPB 400 mg (0 mg Intravenous Stopped 11/10/18 2206)  HYDROmorphone (DILAUDID) injection 0.5 mg (0.5 mg Intravenous Given 11/10/18  2110)     Initial Impression / Assessment and Plan / ED Course  I have reviewed the triage vital signs and the nursing notes.  Pertinent labs & imaging results that were available during my care of the patient were reviewed by me and considered in my medical decision making (see chart for details).        Labs and imaging reviewed and discussed with pt and niece at bedside.  Pt with left sided chest pain with neg trop x 2, ekg sinus tachy.  Heart score 4.  Given age and comorbidity, pt will be admitted for obs admission.   UTI, started cipro.    Discussed with Dr. Maudie Mercury who accepts pt for admission.  Final Clinical Impressions(s) / ED Diagnoses   Final diagnoses:  Chest pain, unspecified type  Acute cystitis with hematuria  Iron deficiency anemia, unspecified iron deficiency anemia type    ED Discharge Orders    None       Landis Martins 11/10/18 2325    Milton Ferguson, MD 11/15/18 [phone]

## 2018-11-11 ENCOUNTER — Encounter (HOSPITAL_COMMUNITY): Payer: Self-pay | Admitting: Internal Medicine

## 2018-11-11 ENCOUNTER — Observation Stay (HOSPITAL_BASED_OUTPATIENT_CLINIC_OR_DEPARTMENT_OTHER): Payer: Medicare HMO

## 2018-11-11 ENCOUNTER — Other Ambulatory Visit: Payer: Self-pay | Admitting: Family Medicine

## 2018-11-11 DIAGNOSIS — Z96652 Presence of left artificial knee joint: Secondary | ICD-10-CM | POA: Diagnosis not present

## 2018-11-11 DIAGNOSIS — Z20828 Contact with and (suspected) exposure to other viral communicable diseases: Secondary | ICD-10-CM | POA: Diagnosis not present

## 2018-11-11 DIAGNOSIS — E119 Type 2 diabetes mellitus without complications: Secondary | ICD-10-CM | POA: Diagnosis not present

## 2018-11-11 DIAGNOSIS — N390 Urinary tract infection, site not specified: Secondary | ICD-10-CM | POA: Diagnosis not present

## 2018-11-11 DIAGNOSIS — R079 Chest pain, unspecified: Secondary | ICD-10-CM | POA: Diagnosis not present

## 2018-11-11 DIAGNOSIS — R0789 Other chest pain: Secondary | ICD-10-CM | POA: Diagnosis not present

## 2018-11-11 DIAGNOSIS — I4891 Unspecified atrial fibrillation: Secondary | ICD-10-CM | POA: Diagnosis not present

## 2018-11-11 DIAGNOSIS — I10 Essential (primary) hypertension: Secondary | ICD-10-CM | POA: Diagnosis not present

## 2018-11-11 DIAGNOSIS — Z794 Long term (current) use of insulin: Secondary | ICD-10-CM | POA: Diagnosis not present

## 2018-11-11 DIAGNOSIS — Z79899 Other long term (current) drug therapy: Secondary | ICD-10-CM | POA: Diagnosis not present

## 2018-11-11 DIAGNOSIS — D509 Iron deficiency anemia, unspecified: Secondary | ICD-10-CM | POA: Diagnosis not present

## 2018-11-11 DIAGNOSIS — F0390 Unspecified dementia without behavioral disturbance: Secondary | ICD-10-CM | POA: Diagnosis not present

## 2018-11-11 LAB — CBC
HCT: 27.4 % — ABNORMAL LOW (ref 36.0–46.0)
Hemoglobin: 7.9 g/dL — ABNORMAL LOW (ref 12.0–15.0)
MCH: 21.5 pg — ABNORMAL LOW (ref 26.0–34.0)
MCHC: 28.8 g/dL — ABNORMAL LOW (ref 30.0–36.0)
MCV: 74.5 fL — ABNORMAL LOW (ref 80.0–100.0)
Platelets: 512 10*3/uL — ABNORMAL HIGH (ref 150–400)
RBC: 3.68 MIL/uL — ABNORMAL LOW (ref 3.87–5.11)
RDW: 19.4 % — ABNORMAL HIGH (ref 11.5–15.5)
WBC: 11.4 10*3/uL — ABNORMAL HIGH (ref 4.0–10.5)
nRBC: 0.3 % — ABNORMAL HIGH (ref 0.0–0.2)

## 2018-11-11 LAB — ECHOCARDIOGRAM COMPLETE
Height: 67 in
Weight: 3627.2 oz

## 2018-11-11 LAB — BASIC METABOLIC PANEL
Anion gap: 10 (ref 5–15)
BUN: 18 mg/dL (ref 8–23)
CO2: 20 mmol/L — ABNORMAL LOW (ref 22–32)
Calcium: 8.9 mg/dL (ref 8.9–10.3)
Chloride: 108 mmol/L (ref 98–111)
Creatinine, Ser: 1.22 mg/dL — ABNORMAL HIGH (ref 0.44–1.00)
GFR calc Af Amer: 49 mL/min — ABNORMAL LOW (ref >60)
GFR calc non Af Amer: 42 mL/min — ABNORMAL LOW (ref >60)
Glucose, Bld: 149 mg/dL — ABNORMAL HIGH (ref 70–99)
Potassium: 4.3 mmol/L (ref 3.5–5.1)
Sodium: 138 mmol/L (ref 135–145)

## 2018-11-11 LAB — GLUCOSE, CAPILLARY
Glucose-Capillary: 241 mg/dL — ABNORMAL HIGH (ref 70–99)
Glucose-Capillary: 266 mg/dL — ABNORMAL HIGH (ref 70–99)
Glucose-Capillary: 133 mg/dL — ABNORMAL HIGH (ref 70–99)
Glucose-Capillary: 124 mg/dL — ABNORMAL HIGH (ref 70–99)
Glucose-Capillary: 163 mg/dL — ABNORMAL HIGH (ref 70–99)
Glucose-Capillary: 165 mg/dL — ABNORMAL HIGH (ref 70–99)

## 2018-11-11 LAB — LIPID PANEL
Cholesterol: 113 mg/dL (ref 0–200)
HDL: 47 mg/dL (ref >40)
LDL Cholesterol: 46 mg/dL (ref 0–99)
Total CHOL/HDL Ratio: 2.4 RATIO
Triglycerides: 102 mg/dL (ref <150)
VLDL: 20 mg/dL (ref 0–40)

## 2018-11-11 LAB — MAGNESIUM: Magnesium: 2.1 mg/dL (ref 1.7–2.4)

## 2018-11-11 LAB — SARS CORONAVIRUS 2 BY RT PCR (HOSPITAL ORDER, PERFORMED IN ~~LOC~~ HOSPITAL LAB): SARS Coronavirus 2: NEGATIVE

## 2018-11-11 MED ORDER — ATORVASTATIN CALCIUM 40 MG PO TABS
40.0000 mg | ORAL_TABLET | Freq: Every day | ORAL | Status: DC
  Administered 2018-11-11 – 2018-11-12 (×2): 40 mg via ORAL
  Filled 2018-11-11 (×2): qty 1

## 2018-11-11 MED ORDER — OLOPATADINE HCL 0.1 % OP SOLN
1.0000 [drp] | Freq: Two times a day (BID) | OPHTHALMIC | Status: DC
  Administered 2018-11-11 – 2018-11-12 (×3): 1 [drp] via OPHTHALMIC
  Filled 2018-11-11 (×2): qty 5

## 2018-11-11 MED ORDER — POLYETHYLENE GLYCOL 3350 17 G PO PACK
17.0000 g | PACK | Freq: Every day | ORAL | Status: DC
  Administered 2018-11-11: 09:00:00 17 g via ORAL
  Filled 2018-11-11: qty 1

## 2018-11-11 MED ORDER — INFLUENZA VAC A&B SA ADJ QUAD 0.5 ML IM PRSY
0.5000 mL | PREFILLED_SYRINGE | INTRAMUSCULAR | Status: DC
  Filled 2018-11-11: qty 0.5

## 2018-11-11 MED ORDER — COLCHICINE 0.6 MG PO TABS
0.6000 mg | ORAL_TABLET | Freq: Every day | ORAL | Status: DC
  Administered 2018-11-11 – 2018-11-12 (×2): 0.6 mg via ORAL
  Filled 2018-11-11 (×2): qty 1

## 2018-11-11 MED ORDER — CLONIDINE HCL 0.2 MG PO TABS
0.2000 mg | ORAL_TABLET | Freq: Every day | ORAL | Status: DC
  Administered 2018-11-11: 22:00:00 0.2 mg via ORAL
  Filled 2018-11-11: qty 1

## 2018-11-11 MED ORDER — DABIGATRAN ETEXILATE MESYLATE 150 MG PO CAPS
150.0000 mg | ORAL_CAPSULE | Freq: Two times a day (BID) | ORAL | Status: DC
  Administered 2018-11-11 – 2018-11-12 (×3): 150 mg via ORAL
  Filled 2018-11-11 (×3): qty 1

## 2018-11-11 MED ORDER — BRIMONIDINE TARTRATE 0.2 % OP SOLN
1.0000 [drp] | Freq: Two times a day (BID) | OPHTHALMIC | Status: DC
  Administered 2018-11-11 – 2018-11-12 (×3): 1 [drp] via OPHTHALMIC
  Filled 2018-11-11: qty 5

## 2018-11-11 MED ORDER — MECLIZINE HCL 25 MG PO TABS
25.0000 mg | ORAL_TABLET | Freq: Three times a day (TID) | ORAL | Status: DC | PRN
  Administered 2018-11-11: 22:00:00 25 mg via ORAL
  Filled 2018-11-11: qty 1

## 2018-11-11 MED ORDER — SODIUM CHLORIDE 0.9 % IV SOLN
1.0000 g | INTRAVENOUS | Status: DC
  Administered 2018-11-11 – 2018-11-12 (×2): 1 g via INTRAVENOUS
  Filled 2018-11-11 (×2): qty 1

## 2018-11-11 MED ORDER — DOCUSATE SODIUM 100 MG PO CAPS: 100.0000 mg | ORAL_CAPSULE | Freq: Every day | ORAL | Status: DC | PRN

## 2018-11-11 MED ORDER — OXYCODONE-ACETAMINOPHEN 5-325 MG PO TABS
1.0000 | ORAL_TABLET | Freq: Four times a day (QID) | ORAL | Status: DC | PRN
  Administered 2018-11-11: 1 via ORAL
  Filled 2018-11-11: qty 1

## 2018-11-11 MED ORDER — PANTOPRAZOLE SODIUM 40 MG PO TBEC
40.0000 mg | DELAYED_RELEASE_TABLET | Freq: Every day | ORAL | Status: DC
  Administered 2018-11-11 – 2018-11-12 (×2): 40 mg via ORAL
  Filled 2018-11-11 (×2): qty 1

## 2018-11-11 MED ORDER — TIMOLOL MALEATE 0.5 % OP SOLN
1.0000 [drp] | Freq: Two times a day (BID) | OPHTHALMIC | Status: DC
  Administered 2018-11-11 – 2018-11-12 (×3): 1 [drp] via OPHTHALMIC
  Filled 2018-11-11: qty 5

## 2018-11-11 MED ORDER — ALBUTEROL SULFATE HFA 108 (90 BASE) MCG/ACT IN AERS
2.0000 | INHALATION_SPRAY | Freq: Four times a day (QID) | RESPIRATORY_TRACT | Status: DC | PRN
  Filled 2018-11-11: qty 6.7

## 2018-11-11 MED ORDER — SENNOSIDES-DOCUSATE SODIUM 8.6-50 MG PO TABS
2.0000 | ORAL_TABLET | Freq: Two times a day (BID) | ORAL | Status: DC
  Administered 2018-11-11 – 2018-11-12 (×3): 2 via ORAL
  Filled 2018-11-11 (×3): qty 2

## 2018-11-11 MED ORDER — IMIPRAMINE HCL 50 MG PO TABS
100.0000 mg | ORAL_TABLET | Freq: Every day | ORAL | Status: DC
  Administered 2018-11-11: 22:00:00 100 mg via ORAL
  Filled 2018-11-11: qty 2

## 2018-11-11 MED ORDER — SODIUM CHLORIDE 0.9% FLUSH
3.0000 mL | Freq: Two times a day (BID) | INTRAVENOUS | Status: DC
  Administered 2018-11-11: 22:00:00 3 mL via INTRAVENOUS

## 2018-11-11 MED ORDER — BRIMONIDINE TARTRATE-TIMOLOL 0.2-0.5 % OP SOLN: 1.0000 [drp] | Freq: Two times a day (BID) | OPHTHALMIC | Status: DC

## 2018-11-11 MED ORDER — MONTELUKAST SODIUM 10 MG PO TABS
10.0000 mg | ORAL_TABLET | Freq: Every day | ORAL | Status: DC
  Administered 2018-11-11: 10 mg via ORAL
  Filled 2018-11-11: qty 1

## 2018-11-11 MED ORDER — BUSPIRONE HCL 5 MG PO TABS
7.5000 mg | ORAL_TABLET | Freq: Three times a day (TID) | ORAL | Status: DC
  Administered 2018-11-11 – 2018-11-12 (×4): 7.5 mg via ORAL
  Filled 2018-11-11 (×4): qty 2

## 2018-11-11 MED ORDER — ALBUTEROL SULFATE (2.5 MG/3ML) 0.083% IN NEBU: 2.5000 mg | INHALATION_SOLUTION | Freq: Four times a day (QID) | RESPIRATORY_TRACT | Status: DC | PRN

## 2018-11-11 MED ORDER — TEMAZEPAM 15 MG PO CAPS
30.0000 mg | ORAL_CAPSULE | Freq: Every day | ORAL | Status: DC
  Administered 2018-11-11: 30 mg via ORAL
  Filled 2018-11-11: qty 2

## 2018-11-11 MED ORDER — DONEPEZIL HCL 10 MG PO TABS
10.0000 mg | ORAL_TABLET | Freq: Every day | ORAL | Status: DC
  Administered 2018-11-11: 22:00:00 10 mg via ORAL
  Filled 2018-11-11: qty 1

## 2018-11-11 MED ORDER — AMLODIPINE BESYLATE 10 MG PO TABS
10.0000 mg | ORAL_TABLET | Freq: Every day | ORAL | Status: DC
  Administered 2018-11-11 – 2018-11-12 (×2): 10 mg via ORAL
  Filled 2018-11-11 (×2): qty 1

## 2018-11-11 MED ORDER — FLUOXETINE HCL 10 MG PO CAPS
10.0000 mg | ORAL_CAPSULE | Freq: Every day | ORAL | Status: DC
  Administered 2018-11-11 – 2018-11-12 (×2): 10 mg via ORAL
  Filled 2018-11-11 (×2): qty 1

## 2018-11-11 MED ORDER — HYDROXYZINE HCL 25 MG PO TABS: 25.0000 mg | ORAL_TABLET | Freq: Three times a day (TID) | ORAL | Status: DC | PRN

## 2018-11-11 MED ORDER — INSULIN ASPART 100 UNIT/ML ~~LOC~~ SOLN
0.0000 [IU] | SUBCUTANEOUS | Status: DC
  Administered 2018-11-11: 5 [IU] via SUBCUTANEOUS
  Administered 2018-11-11: 01:00:00 3 [IU] via SUBCUTANEOUS
  Administered 2018-11-11: 09:00:00 1 [IU] via SUBCUTANEOUS
  Administered 2018-11-11 (×2): 2 [IU] via SUBCUTANEOUS
  Administered 2018-11-11: 1 [IU] via SUBCUTANEOUS
  Administered 2018-11-12 (×3): 2 [IU] via SUBCUTANEOUS
  Filled 2018-11-11 (×2): qty 1

## 2018-11-11 MED ORDER — TOPIRAMATE 100 MG PO TABS
100.0000 mg | ORAL_TABLET | Freq: Two times a day (BID) | ORAL | Status: DC
  Administered 2018-11-11 – 2018-11-12 (×3): 100 mg via ORAL
  Filled 2018-11-11 (×3): qty 1

## 2018-11-11 NOTE — Progress Notes (Signed)
  Echocardiogram 2D Echocardiogram has been performed.  [name] 11/11/2018, 9:24 AM

## 2018-11-11 NOTE — H&P (Signed)
TRH H&P    Patient Demographics:    [name], is a 78 y.o. female  MRN: [id number]  DOB - 15-Aug-1940  Admit Date - 11/10/2018  Referring MD/NP/PA:  Evalee Jefferson  Outpatient Primary MD for the patient is [name], MD  Patient coming from:  home  Chief complaint- chest pain   HPI:    [name]  is a 78 y.o. female  w hypertension, hyperlipidemia, Dm2, Pafib,  h/o pulmonary embolism, anemia, Gerd, Anxiety, nephrolithiasis,  Dementia, who presents with intermittent chest pain for the past 4 weeks. Chest pain substernal to left sided, "sharp" intermittent. Slight dyspnea. Slight cough (dry),  Pt denies fever, chills, palp, n/v, abd pain, diarrhea, brbpr, black stool.    In ED,  T 98.2, P 113, R 17, Bp 146/81  Pox 95% on RA Wt 104.3kg  CTA chest IMPRESSION: No pulmonary embolus or acute intrathoracic abnormality.  Na 138, K 4.0, Bun 21, Creatinine 1.21 Alb 3.4, Ast 9, Alt 9, Alk phos 95, T. Bili 0.1 Urinalysis wbc >50, rbc 21-50  Wbc 10.6, Hgb 8.1, Plt 624 Trop 7  Ekg ST at 110, nl axis, nl int, no st-t changes c/w ischemia  Pt will be admitted for w/up of chest pain, and acute lower uti     Review of systems:    In addition to the HPI above,  No Fever-chills, No Headache, No changes with Vision or hearing, No problems swallowing food or Liquids,  No Abdominal pain, No Nausea or Vomiting, bowel movements are regular, No Blood in stool or Urine, No dysuria, No new skin rashes or bruises, No new joints pains-aches,  No new weakness, tingling, numbness in any extremity, No recent weight gain or loss, No polyuria, polydypsia or polyphagia, No significant Mental Stressors.  All other systems reviewed and are negative.    Past History of the following :    Past Medical History:  Diagnosis Date   Anemia, iron deficiency 2012   Evaluated by Dr. Oneida Alar; H&H of 9.3/30.8  with MCV-79 in 10/2010; 3/3 positive Hemoccult cards in 07/2011   Anxiety    was on Prozac for a couple of weeks   Atrial fibrillation ([place])    takes Coumadin daily   Bruises easily    takes Coumadin   Chronic anticoagulation 07/21/2010   Chronic back pain    related to knee pain   Degenerative joint disease    Knees   Dementia ([place])    Depression    Diabetes mellitus    takes Metformin daily   Gastroesophageal reflux disease    takes Omeprazole daily   Glaucoma    Glaucoma    uses eye drops at night   History of blood transfusion 1969   History of gout    was on medication but taken off of several months ago   Hx of colonic polyps    Hx of migraines    last one about a yr ago;takes Topamax daily   Hyperlipidemia    takes Pravastatin daily  Hypertension    takes Hyzaar daily and Propranolol    Insomnia    takes Restoril prn   Joint pain    Joint swelling    Memory loss    takes donepezil   Migraine    Migraine    Nocturia    Overweight(278.02)    Pneumonia 1969   hx of   Pulmonary embolism [facility]) May 2012   acute presentation, bilateral PE   Skin spots-aging    Urinary frequency       Past Surgical History:  Procedure Laterality Date   CATARACT EXTRACTION W/PHACO Right 04/11/2012   Procedure: CATARACT EXTRACTION PHACO AND INTRAOCULAR LENS PLACEMENT ([place]);  Surgeon: Elta Guadeloupe T. Gershon Crane, MD;  Location: [facility];  Service: Ophthalmology;  Laterality: Right;  CDE=9.61   CATARACT EXTRACTION W/PHACO Left 12/26/2012   Procedure: CATARACT EXTRACTION PHACO AND INTRAOCULAR LENS PLACEMENT ([facility]);  Surgeon: Elta Guadeloupe T. Gershon Crane, MD;  Location: [facility];  Service: Ophthalmology;  Laterality: Left;  CDE:7.74   COLONOSCOPY  Jan 2002; 2012   2002: Dr. Tamala Julian, ext. hemorrhoids, nl colon; 2012-adenomatous polyps, gastritis on EGD   CYSTOSCOPY W/ URETERAL STENT PLACEMENT Bilateral 02/25/2018   Procedure: CYSTOSCOPY WITH BILATERAL RETROGRADE PYELOGRAM/BILATERAL  URETERAL STENT PLACEMENT, BLADDER BIOPSIES WITH FULGERATION;  Surgeon: [name], MD;  Location: [facility];  Service: Urology;  Laterality: Bilateral;   CYSTOSCOPY/URETEROSCOPY/HOLMIUM LASER/STENT PLACEMENT Bilateral 03/21/2018   Procedure: CYSTOSCOPY/URETEROSCOPY/HOLMIUM LASER/STENT PLACEMENT;  Surgeon: [name], MD;  Location: [facility];  Service: Urology;  Laterality: Bilateral;  ONLY NEEDS 60 MIN   DILATION AND CURETTAGE OF UTERUS     ESOPHAGOGASTRODUODENOSCOPY  08/24/2011   ESOPHAGOGASTRODUODENOSCOPY; esophageal dilatation; Rogene Houston, MD;   GIVENS CAPSULE STUDY  08/18/2011   Procedure: GIVENS CAPSULE STUDY;  Surgeon: Rogene Houston, MD;  Location[facility];  Service: Endoscopy;  Laterality: N/A;  730   KNEE ARTHROSCOPY W/ MENISCECTOMY  90's   Left   ORIF ANKLE FRACTURE Right 90's   TONSILLECTOMY     TOTAL KNEE ARTHROPLASTY  10/20/2011   Procedure: TOTAL KNEE ARTHROPLASTY;  Surgeon: Ninetta Lights, MD;  Location: [place];  Service: Orthopedics;  Laterality: Left;   UPPER GASTROINTESTINAL ENDOSCOPY     URETHRAL DILATION        Social History:      Social History   Tobacco Use   Smoking status: Never Smoker   Smokeless tobacco: Never Used  Substance Use Topics   Alcohol use: No       Family History :     Family History  Problem Relation Age of Onset   Diabetes Mother    Hypertension Mother    Arthritis Mother    Heart failure Mother    Migraines Mother    Kidney failure Mother    Leukemia Father    Migraines Father    Kidney failure Father    Diabetes Sister    Hypertension Sister    Diabetes Brother    Hypertension Brother    Hypertension Brother    Hypertension Brother    Hypertension Brother    Diabetes Brother    Diabetes Brother    Diabetes Brother    Pulmonary embolism Sister    Migraines Sister    Kidney failure Brother    Colon cancer Neg Hx    Colon polyps Neg Hx        Home Medications:   Prior to Admission medications   Medication Sig Start Date End Date Taking? Authorizing Provider  acetaminophen (TYLENOL) 500 MG tablet Take one tablet two times daily for chronic pain 07/03/18  Yes [name], MD  albuterol (VENTOLIN HFA) 108 (90 Base) MCG/ACT inhaler Inhale 2 puffs into the lungs every 6 (six) hours as needed for wheezing or shortness of breath. 02/27/18  Yes Emokpae, Courage, MD  amLODipine (NORVASC) 10 MG tablet Take 1 tablet (10 mg total) by mouth daily. 02/27/18  Yes Emokpae, Courage, MD  atorvastatin (LIPITOR) 40 MG tablet TAKE 1 TABLET BY MOUTH EVERY DAY 08/29/18  Yes [name], MD  busPIRone (BUSPAR) 7.5 MG tablet TAKE 1 TABLET BY MOUTH 3 TIMES DAILY Patient taking differently: Take 7.5 mg by mouth 3 (three) times daily.  12/23/17  Yes [name], MD  cloNIDine (CATAPRES) 0.2 MG tablet Take 1 tablet (0.2 mg total) by mouth at bedtime. 04/10/18  Yes [name], MD  clotrimazole-betamethasone (LOTRISONE) cream Apply 1 application topically 2 (two) times daily. D/c vusion ointment 09/27/18  Yes [name], MD  colchicine 0.6 MG tablet Take 1 tablet (0.6 mg total) by mouth daily. May decrease to once per day as needed for diarrhea 06/27/17  Yes Tanna Furry, MD  COMBIGAN 0.2-0.5 % ophthalmic solution Place 1 drop into both eyes 2 (two) times daily. 03/23/17  Yes [provider]  dabigatran (PRADAXA) 150 MG CAPS capsule Take 1 capsule (150 mg total) by mouth 2 (two) times daily. 02/27/18  Yes Emokpae, Courage, MD  donepezil (ARICEPT) 10 MG tablet Take 1 tablet (10 mg total) by mouth at bedtime. 10/22/18  Yes Perlie Mayo, NP  Fluoxetine HCl, PMDD, 10 MG TABS Take 1 tablet (10 mg total) by mouth daily. 01/27/18  Yes Johnson, Clanford L, MD  hydrOXYzine (VISTARIL) 25 MG capsule Take 1 capsule (25 mg total) by mouth 3 (three) times daily. 09/27/18  Yes [name], MD  imipramine (TOFRANIL) 50 MG tablet TAKE 2  TABLETS (100 MG TOTAL) BY MOUTH AT BEDTIME. 04/10/18  Yes [name], MD  insulin NPH-regular Human (NOVOLIN 70/30) (70-30) 100 UNIT/ML injection Inject 40 Units into the skin 2 (two) times daily with a meal. 09/27/18  Yes [name], MD  meclizine (ANTIVERT) 25 MG tablet TAKE 1 TABLET THREE TIMES DAILY AS NEEDED FOR DIZZINESS. 08/07/18  Yes [name], MD  montelukast (SINGULAIR) 10 MG tablet TAKE 1 TABLET BY MOUTH EVERYDAY AT BEDTIME 10/22/18  Yes Perlie Mayo, NP  nystatin (MYCOSTATIN/NYSTOP) powder Apply topically daily as needed (for rash under breasts). 09/27/18  Yes [name], MD  omeprazole (PRILOSEC) 40 MG capsule TAKE 1 CAPSULE BY MOUTH TWICE A DAY 07/20/18  Yes [name], MD  senna-docusate (SENOKOT-S) 8.6-50 MG tablet Take 2 tablets by mouth at bedtime. Patient taking differently: Take 2 tablets by mouth 2 (two) times daily.  02/27/18 02/27/19 Yes Emokpae, Courage, MD  SUMAtriptan (IMITREX) 5 MG/ACT nasal spray Place 1 spray into the nose every 2 (two) hours as needed for migraine.  01/16/17  Yes [provider]  temazepam (RESTORIL) 30 MG capsule Take 1 capsule (30 mg total) by mouth at bedtime. 10/03/18  Yes [name], MD  topiramate (TOPAMAX) 100 MG tablet TAKE 1 TABLET BY MOUTH TWICE A DAY 05/15/18  Yes [name], MD  Vitamin D, Ergocalciferol, (DRISDOL) 50000 units CAPS capsule TAKE 1 CAPSULE (50,000 UNITS TOTAL) BY MOUTH ONCE A WEEK. Patient taking differently: Take 50,000 Units by mouth every 7 (seven) days. Saturday 11/18/17  Yes [name], MD  ACCU-CHEK AVIVA PLUS test strip USE TO TEST BLOOD SUAGR 2 TIMES A DAY 08/01/18   [name], MD  Blood Glucose Monitoring Suppl (ACCU-CHEK AVIVA PLUS) w/Device KIT For once daily testing dx E11.9 11/24/15   [name], MD  conjugated estrogens (PREMARIN) vaginal cream Place vaginally 2 (two) times a week. Apply sparingly ( fingertip)  Twice weekly to  vaginal area on Wednesdays and Fridays 01/30/18   Irwin Brakeman L, MD  docusate sodium (COLACE) 100 MG capsule Take 1 capsule (100 mg total) by mouth daily as needed for mild constipation. 03/20/18   Hosie Poisson, MD  Insulin Syringe-Needle U-100 (BD VEO INSULIN SYRINGE U/F) 31G X 15/64" 0.5 ML MISC Use to inject Novolin twice daily 08/17/18   [name], MD  olopatadine (PATANOL) 0.1 % ophthalmic solution Place 1 drop into both eyes 2 (two) times daily. 01/27/18   Johnson, Clanford L, MD  oxyCODONE-acetaminophen (PERCOCET) 10-325 MG tablet Take 1 tablet 2 times daily by mouth for chronic pain. 11/20/18 12/20/18  [name], MD  polyethylene glycol (MIRALAX / Floria Raveling) packet Take 17 g by mouth daily. 03/21/18   Hosie Poisson, MD     Allergies:     Allergies  Allergen Reactions   Penicillins Itching    Did it involve swelling of the face/tongue/throat, SOB, or low BP? No Did it involve sudden or severe rash/hives, skin peeling, or any reaction on the inside of your mouth or nose? No Did you need to seek medical attention at a hospital or doctor's office? No When did it last happen?many years ago If all above answers are NO, may proceed with cephalosporin use.     Fentanyl Itching   Sulfonamide Derivatives Hives   Codeine Itching and Rash    tussionex  Is tolerated by patient, no phenergan dm      Physical Exam:   Vitals  Blood pressure (!) 167/107, pulse (!) 118, temperature 98.2 F (36.8 C), temperature source Oral, resp. rate (!) 23, height 5' 7" (1.702 m), weight 104.3 kg, SpO2 92 %.  1.  General: Axoxo2(person, place)  2. Psychiatric: euthymic  3. Neurologic: cn2-12 intact, reflexes 2+  Symmetric, diffuse with no clonus, motor 5/5 in all 4 ext   4. HEENMT:  Anicteric, pupils 1.32m symmetric, direct, consensual, intact Neck: no jvd  5. Respiratory : CTAB  6. Cardiovascular : rrr s1, s2,   7. Gastrointestinal:  Abd: soft, nt, nd,  +bs  8. Skin:  Ext: no c/c/e,  No rash  9.Musculoskeletal:  Good ROM    Data Review:    CBC Recent Labs  Lab 11/10/18 1518  WBC 10.6*  HGB 8.1*  HCT 29.7*  PLT 624*  MCV 76.7*  MCH 20.9*  MCHC 27.3*  RDW 19.8*   ------------------------------------------------------------------------------------------------------------------  Results for orders placed or performed during the hospital encounter of 11/10/18 (from the past 48 hour(s))  Basic metabolic panel     Status: Abnormal   Collection Time: 11/10/18  3:18 PM  Result Value Ref Range   Sodium 138 135 - 145 mmol/L   Potassium 4.0 3.5 - 5.1 mmol/L   Chloride 107 98 - 111 mmol/L   CO2 23 22 - 32 mmol/L   Glucose, Bld 138 (H) 70 - 99 mg/dL   BUN 21 8 - 23 mg/dL   Creatinine, Ser 1.21 (H) 0.44 - 1.00 mg/dL   Calcium 8.8 (L) 8.9 - 10.3 mg/dL   GFR calc  non Af Amer 43 (L) >60 mL/min   GFR calc Af Amer 49 (L) >60 mL/min   Anion gap 8 5 - 15    Comment: Performed at [facility], [address], [place], [place] [address]  CBC     Status: Abnormal   Collection Time: 11/10/18  3:18 PM  Result Value Ref Range   WBC 10.6 (H) 4.0 - 10.5 K/uL   RBC 3.87 3.87 - 5.11 MIL/uL   Hemoglobin 8.1 (L) 12.0 - 15.0 g/dL    Comment: Reticulocyte Hemoglobin testing may be clinically indicated, consider ordering this additional test [id number]    HCT 29.7 (L) 36.0 - 46.0 %   MCV 76.7 (L) 80.0 - 100.0 fL   MCH 20.9 (L) 26.0 - 34.0 pg   MCHC 27.3 (L) 30.0 - 36.0 g/dL   RDW 19.8 (H) 11.5 - 15.5 %   Platelets 624 (H) 150 - 400 K/uL   nRBC 0.2 0.0 - 0.2 %    Comment: Performed at [facility], [address] [place], [place] [address]  Troponin I (High Sensitivity)     Status: None   Collection Time: 11/10/18  3:18 PM  Result Value Ref Range   Troponin I (High Sensitivity) 7 <18 ng/L    Comment: (NOTE) Elevated high sensitivity troponin I (hsTnI) values and significant  changes across serial measurements may suggest ACS but many other   chronic and acute conditions are known to elevate hsTnI results.  Refer to the "Links" section for chest pain algorithms and additional  guidance. Performed at [facility], [address] [place], [place] [address]   Hepatic function panel     Status: Abnormal   Collection Time: 11/10/18  3:18 PM  Result Value Ref Range   Total Protein 8.0 6.5 - 8.1 g/dL   Albumin 3.4 (L) 3.5 - 5.0 g/dL   AST 9 (L) 15 - 41 U/L   ALT 9 0 - 44 U/L   Alkaline Phosphatase 95 38 - 126 U/L   Total Bilirubin 0.1 (L) 0.3 - 1.2 mg/dL   Bilirubin, Direct <0.1 0.0 - 0.2 mg/dL   Indirect Bilirubin NOT CALCULATED 0.3 - 0.9 mg/dL    Comment: Performed at [facility], [address], [place], [place] [address]  Troponin I (High Sensitivity)     Status: None   Collection Time: 11/10/18  4:58 PM  Result Value Ref Range   Troponin I (High Sensitivity) 8 <18 ng/L    Comment: (NOTE) Elevated high sensitivity troponin I (hsTnI) values and significant  changes across serial measurements may suggest ACS but many other  chronic and acute conditions are known to elevate hsTnI results.  Refer to the "Links" section for chest pain algorithms and additional  guidance. Performed at [facility], [address], [place], [place] [address]   Urinalysis, Routine w reflex microscopic     Status: Abnormal   Collection Time: 11/10/18  7:20 PM  Result Value Ref Range   Color, Urine YELLOW YELLOW   APPearance CLOUDY (A) CLEAR   Specific Gravity, Urine 1.015 1.005 - 1.030   pH 5.0 5.0 - 8.0   Glucose, UA NEGATIVE NEGATIVE mg/dL   Hgb urine dipstick SMALL (A) NEGATIVE   Bilirubin Urine NEGATIVE NEGATIVE   Ketones, ur NEGATIVE NEGATIVE mg/dL   Protein, ur 30 (A) NEGATIVE mg/dL   Nitrite NEGATIVE NEGATIVE   Leukocytes,Ua LARGE (A) NEGATIVE   RBC / HPF 21-50 0 - 5 RBC/hpf   WBC, UA >50 (H)  0 - 5 WBC/hpf   Bacteria, UA MANY (A) NONE SEEN   Squamous Epithelial / LPF 0-5 0 - 5   WBC Clumps PRESENT    Mucus PRESENT     Comment:  Performed at [facility], [address] [place], [place] [address]  SARS Coronavirus 2 [facility] order, Performed in [facility] hospital lab) Nasopharyngeal Nasopharyngeal Swab     Status: None   Collection Time: 11/10/18 10:33 PM   Specimen: Nasopharyngeal Swab  Result Value Ref Range   SARS Coronavirus 2 NEGATIVE NEGATIVE    Comment: (NOTE) If result is NEGATIVE SARS-CoV-2 target nucleic acids are NOT DETECTED. The SARS-CoV-2 RNA is generally detectable in upper and lower  respiratory specimens during the acute phase of infection. The lowest  concentration of SARS-CoV-2 viral copies this assay can detect is 250  copies / mL. A negative result does not preclude SARS-CoV-2 infection  and should not be used as the sole basis for treatment or other  patient management decisions.  A negative result may occur with  improper specimen collection / handling, submission of specimen other  than nasopharyngeal swab, presence of viral mutation(s) within the  areas targeted by this assay, and inadequate number of viral copies  (<250 copies / mL). A negative result must be combined with clinical  observations, patient history, and epidemiological information. If result is POSITIVE SARS-CoV-2 target nucleic acids are DETECTED. The SARS-CoV-2 RNA is generally detectable in upper and lower  respiratory specimens dur ing the acute phase of infection.  Positive  results are indicative of active infection with SARS-CoV-2.  Clinical  correlation with patient history and other diagnostic information is  necessary to determine patient infection status.  Positive results do  not rule out bacterial infection or co-infection with other viruses. If result is PRESUMPTIVE POSTIVE SARS-CoV-2 nucleic acids MAY BE PRESENT.   A presumptive positive result was obtained on the submitted specimen  and confirmed on repeat testing.  While 2019 novel coronavirus  (SARS-CoV-2) nucleic acids may be present in the  submitted sample  additional confirmatory testing may be necessary for epidemiological  and / or clinical management purposes  to differentiate between  SARS-CoV-2 and other Sarbecovirus currently known to infect humans.  If clinically indicated additional testing with an alternate test  methodology [phone]) is advised. The SARS-CoV-2 RNA is generally  detectable in upper and lower respiratory sp ecimens during the acute  phase of infection. The expected result is Negative. Fact Sheet for Patients:  StrictlyIdeas.no Fact Sheet for Healthcare Providers: BankingDealers.co.za This test is not yet approved or cleared by the [place] FDA and has been authorized for detection and/or diagnosis of SARS-CoV-2 by FDA under an Emergency Use Authorization (EUA).  This EUA will remain in effect (meaning this test can be used) for the duration of the COVID-19 declaration under Section 564(b)(1) of the Act, 21 U.S.C. section 360bbb-3(b)(1), unless the authorization is terminated or revoked sooner. Performed at [facility], [address] [place], [place] [address]     Chemistries  Recent Labs  Lab 11/10/18 1518  NA 138  K 4.0  CL 107  CO2 23  GLUCOSE 138*  BUN 21  CREATININE 1.21*  CALCIUM 8.8*  AST 9*  ALT 9  ALKPHOS 95  BILITOT 0.1*   ------------------------------------------------------------------------------------------------------------------  ------------------------------------------------------------------------------------------------------------------ GFR: Estimated Creatinine Clearance: 46.8 mL/min (A) (by C-G formula based on SCr of 1.21 mg/dL (H)). Liver Function Tests: Recent Labs  Lab 11/10/18 1518  AST 9*  ALT  9  ALKPHOS 95  BILITOT 0.1*  PROT 8.0  ALBUMIN 3.4*   No results for input(s): LIPASE, AMYLASE in the last 168 hours. No results for input(s): AMMONIA in the last 168 hours. Coagulation  Profile: No results for input(s): INR, PROTIME in the last 168 hours. Cardiac Enzymes: No results for input(s): CKTOTAL, CKMB, CKMBINDEX, TROPONINI in the last 168 hours. BNP (last 3 results) No results for input(s): PROBNP in the last 8760 hours. HbA1C: No results for input(s): HGBA1C in the last 72 hours. CBG: No results for input(s): GLUCAP in the last 168 hours. Lipid Profile: No results for input(s): CHOL, HDL, LDLCALC, TRIG, CHOLHDL, LDLDIRECT in the last 72 hours. Thyroid Function Tests: No results for input(s): TSH, T4TOTAL, FREET4, T3FREE, THYROIDAB in the last 72 hours. Anemia Panel: No results for input(s): VITAMINB12, FOLATE, FERRITIN, TIBC, IRON, RETICCTPCT in the last 72 hours.  --------------------------------------------------------------------------------------------------------------- Urine analysis:    Component Value Date/Time   COLORURINE YELLOW 11/10/2018 1920   APPEARANCEUR CLOUDY (A) 11/10/2018 1920   LABSPEC 1.015 11/10/2018 1920   PHURINE 5.0 11/10/2018 1920   GLUCOSEU NEGATIVE 11/10/2018 1920   HGBUR SMALL (A) 11/10/2018 1920   HGBUR negative 01/06/2010 0929   BILIRUBINUR NEGATIVE 11/10/2018 1920   BILIRUBINUR moderate 11/17/2016 1438   KETONESUR NEGATIVE 11/10/2018 1920   PROTEINUR 30 (A) 11/10/2018 1920   UROBILINOGEN >=8.0 (A) 11/17/2016 1438   UROBILINOGEN 0.2 10/12/2011 1556   NITRITE NEGATIVE 11/10/2018 1920   LEUKOCYTESUR LARGE (A) 11/10/2018 1920      Imaging Results:    Dg Chest 2 View  Result Date: 11/10/2018 CLINICAL DATA:  Cough, shortness of breath, and chest pain for 1 month, history hypertension, diabetes mellitus, atrial fibrillation, pulmonary embolism. EXAM: CHEST - 2 VIEW COMPARISON:  03/17/2018 FINDINGS: Upper normal heart size. Calcified tortuous aorta. Mediastinal contours and pulmonary vascularity otherwise normal. Mild elevation of LEFT diaphragm. Lungs clear. No infiltrate, pleural effusion or pneumothorax. Bones  unremarkable. IMPRESSION: No acute abnormalities. Electronically Signed   By: Lavonia Dana M.D.   On: 11/10/2018 13:12   Ct Angio Chest Pe W And/or Wo Contrast  Result Date: 11/10/2018 CLINICAL DATA:  PE suspected, high pretest prob. Left-sided chest pain and shortness of breath. EXAM: CT ANGIOGRAPHY CHEST WITH CONTRAST TECHNIQUE: Multidetector CT imaging of the chest was performed using the standard protocol during bolus administration of intravenous contrast. Multiplanar CT image reconstructions and MIPs were obtained to evaluate the vascular anatomy. CONTRAST:  54m OMNIPAQUE IOHEXOL 350 MG/ML SOLN COMPARISON:  Radiograph earlier this day. Chest CT [id number]: Cardiovascular: There are no filling defects within the pulmonary arteries to suggest pulmonary embolus. The thoracic aorta is normal in caliber without dissection. Mild aortic tortuosity and atherosclerosis. Borderline cardiomegaly. No pericardial effusion. Mediastinum/Nodes: No enlarged mediastinal or hilar lymph nodes. Small hiatal hernia, esophagus is otherwise decompressed. No visualized thyroid nodule. Lungs/Pleura: Calcified granuloma in the right upper lobe. No focal airspace disease. No pleural fluid or pulmonary edema. Mild breathing motion artifact in the lung bases. Trachea and mainstem bronchi are patent. Upper Abdomen: No acute findings. Musculoskeletal: There are no acute or suspicious osseous abnormalities. Review of the MIP images confirms the above findings. IMPRESSION: No pulmonary embolus or acute intrathoracic abnormality. Aortic Atherosclerosis (ICD10-I70.0). Electronically Signed   By: MKeith RakeM.D.   On: 11/10/2018 21:50       Assessment & Plan:    Principal Problem:   Chest pain Active Problems:   Dyslipidemia   Migraine   Essential hypertension  Chest pain Tele Trop I  Check cardiac echo NPO after MN Please consult cardiology in AM  Acute lower uti Urine culture Rocephin 1gm iv qday  H/o  Pafib, PE Cont Pradaxa 135m po bid  Hypertension Cont Amlodipine 149mpo qday Cont Catapress 0.35m735mHyperlipidemia Cont Lipitor 45m28m qhs  Dm2 HOLD insulin 70/30 fsbs q4h,  ISS  Anxiety/ Dementia Cont Buspar 7.5mg 86mbid Cont Fluoxetine 10mg 67mday Cont Aricept 10mg p19may Cont Tofranil 100mg po80my Cont Vistaril prn  H/o Migraines Cont Topiramate   Gerd Cont PPI   DVT Prophylaxis-   Lovenox - SCDs  AM Labs Ordered, also please review Full Orders  Family Communication: Admission, patients condition and plan of care including tests being ordered have been discussed with the patient  who indicate understanding and agree with the plan and Code Status.  Code Status:  DNR per patient,  Daughter notified of admission, and aware of code status.   Admission status: Observation: Based on patients clinical presentation and evaluation of above clinical data, I have made determination that patient meets observation criteria at this time.   Time spent in minutes : 70    KiJani Gravel9/26/2020 at 12:19 AM

## 2018-11-11 NOTE — Progress Notes (Addendum)
Pt needs a urine culture, RN unable to collect because pt voided in the pot before calling RN. Will try again/ update oncoming nurse. Lupita Leash, MD updated.

## 2018-11-11 NOTE — Evaluation (Signed)
Clinical/Bedside Swallow Evaluation Patient Details  Name: [name] MRN: [id number] Date of Birth: May 11, 1940  Today's Date: 11/11/2018 Time: SLP Start Time (ACUTE ONLY): [id number] SLP Stop Time (ACUTE ONLY): 1655 SLP Time Calculation (min) (ACUTE ONLY): 20 min  Past Medical History:  Past Medical History:  Diagnosis Date  . Anemia, iron deficiency 2012   Evaluated by Dr. Oneida Alar; H&H of 9.3/30.8 with [place] in 10/2010; 3/3 positive Hemoccult cards in 07/2011  . Anxiety    was on Prozac for a couple of weeks  . Atrial fibrillation ([place])    takes Coumadin daily  . Bruises easily    takes Coumadin  . Chronic anticoagulation 07/21/2010  . Chronic back pain    related to knee pain  . Degenerative joint disease    Knees  . Dementia ([place])   . Depression   . Diabetes mellitus    takes Metformin daily  . Gastroesophageal reflux disease    takes Omeprazole daily  . Glaucoma   . Glaucoma    uses eye drops at night  . History of blood transfusion 1969  . History of gout    was on medication but taken off of several months ago  . Hx of colonic polyps   . Hx of migraines    last one about a yr ago;takes Topamax daily  . Hyperlipidemia    takes Pravastatin daily  . Hypertension    takes Hyzaar daily and Propranolol   . Insomnia    takes Restoril prn  . Joint pain   . Joint swelling   . Memory loss    takes donepezil  . Migraine   . Migraine   . Nocturia   . Overweight(278.02)   . Pneumonia 1969   hx of  . Pulmonary embolism [facility]) May 2012   acute presentation, bilateral PE  . Skin spots-aging   . Urinary frequency    Past Surgical History:  Past Surgical History:  Procedure Laterality Date  . CATARACT EXTRACTION W/PHACO Right 04/11/2012   Procedure: CATARACT EXTRACTION PHACO AND INTRAOCULAR LENS PLACEMENT (IOC);  Surgeon: Elta Guadeloupe T. Gershon Crane, MD;  Location: [facility];  Service: Ophthalmology;  Laterality: Right;  CDE=9.61  . CATARACT EXTRACTION W/PHACO Left 12/26/2012   Procedure: CATARACT EXTRACTION PHACO AND INTRAOCULAR LENS PLACEMENT (IOC);  Surgeon: Elta Guadeloupe T. Gershon Crane, MD;  Location: [facility];  Service: Ophthalmology;  Laterality: Left;  CDE:7.74  . COLONOSCOPY  Jan 2002; 2012   2002: Dr. Tamala Julian, ext. hemorrhoids, nl colon; 2012-adenomatous polyps, gastritis on EGD  . CYSTOSCOPY W/ URETERAL STENT PLACEMENT Bilateral 02/25/2018   Procedure: CYSTOSCOPY WITH BILATERAL RETROGRADE PYELOGRAM/BILATERAL URETERAL STENT PLACEMENT, BLADDER BIOPSIES WITH FULGERATION;  Surgeon: [name], MD;  Location: [facility];  Service: Urology;  Laterality: Bilateral;  . CYSTOSCOPY/URETEROSCOPY/HOLMIUM LASER/STENT PLACEMENT Bilateral 03/21/2018   Procedure: CYSTOSCOPY/URETEROSCOPY/HOLMIUM LASER/STENT PLACEMENT;  Surgeon: [name], MD;  [facility];  Service: Urology;  Laterality: Bilateral;  ONLY NEEDS 60 MIN  . DILATION AND CURETTAGE OF UTERUS    . ESOPHAGOGASTRODUODENOSCOPY  08/24/2011   ESOPHAGOGASTRODUODENOSCOPY; esophageal dilatation; Rogene Houston, MD;  . Freda Munro CAPSULE STUDY  08/18/2011   Procedure: GIVENS CAPSULE STUDY;  Surgeon: Rogene Houston, MD;  Location: [facility];  Service: Endoscopy;  Laterality: N/A;  730  . KNEE ARTHROSCOPY W/ MENISCECTOMY  90's   Left  . ORIF ANKLE FRACTURE Right 90's  . TONSILLECTOMY    . TOTAL KNEE ARTHROPLASTY  10/20/2011   Procedure: TOTAL KNEE ARTHROPLASTY;  Surgeon: Quillian Quince  Dennie Bible, MD;  Location: [place];  Service: Orthopedics;  Laterality: Left;  . UPPER GASTROINTESTINAL ENDOSCOPY    . URETHRAL DILATION     HPI:  78 year old female with hypertension hyperlipidemia type 2 diabetes PE EF, history of PE, anemia, GERD, anxiety, nephrolithiasis, dementia who lives with daughter brought to the ER for evaluation of chest pain going on for 4 weeks, left-sided, intermittent sharp in nature with some dry cough and slight dyspnea   Assessment / Plan / Recommendation Clinical Impression  Patient presents with an  oropharyngeal swallow that is [facility] without any over s/s of aspiration or penetration. Patient endorses globus sensation in throat when eating solid foods, GERD symptoms such as throat irritation in morning, difficulty breathing at times. She reports that she had her esophageal dilation a "long time ago" but has not been to GI MD since. The symptoms she is describing are all related to GERD and during this bedside swallow evaluation, SLP did not observe any oral or pharyngeal swallow impairment. SLP recommended that patient request GI consult as an outpatient, but her symptoms are not severe enough to warrant having this done while in current hospital admission. SLP Visit Diagnosis: Dysphagia, unspecified (R13.10)    Aspiration Risk  No limitations    Diet Recommendation Thin liquid;Regular   Liquid Administration via: Cup;Straw Medication Administration: Whole meds with liquid Supervision: Patient able to self feed Postural Changes: Seated upright at 90 degrees;Remain upright for at least 30 minutes after po intake    Other  Recommendations Recommended Consults: Other (Comment)(OP GI consult) Oral Care Recommendations: Oral care BID   Follow up Recommendations None      Frequency and Duration     N/A       Prognosis   N/A     Swallow Study   General Date of Onset: 11/11/18 HPI: 78 year old female with hypertension hyperlipidemia type 2 diabetes PE EF, history of PE, anemia, GERD, anxiety, nephrolithiasis, dementia who lives with daughter brought to the ER for evaluation of chest pain going on for 4 weeks, left-sided, intermittent sharp in nature with some dry cough and slight dyspnea Type of Study: Bedside Swallow Evaluation Previous Swallow Assessment: N/A Diet Prior to this Study: Regular;Thin liquids Temperature Spikes Noted: No Respiratory Status: Room air History of Recent Intubation: No Behavior/Cognition: Alert;Cooperative;Pleasant mood Oral Cavity Assessment: Within  Functional Limits Oral Care Completed by SLP: No Oral Cavity - Dentition: Adequate natural dentition Vision: Functional for self-feeding Self-Feeding Abilities: Able to feed self Patient Positioning: Upright in chair;Postural control adequate for testing Baseline Vocal Quality: Normal Volitional Cough: Strong Volitional Swallow: Able to elicit    Oral/Motor/Sensory Function Overall Oral Motor/Sensory Function: Within functional limits   Ice Chips     Thin Liquid Thin Liquid: Within functional limits Presentation: Cup;Self Fed;Straw Other Comments: No overt s/s of aspiration or penetration    Nectar Thick     Honey Thick     Puree Puree: Within functional limits   Solid     Solid: Not tested      Nadara Mode Tarrell 11/11/2018,5:34 PM   Sonia Baller, MA, [place] Speech Therapy [facility]

## 2018-11-11 NOTE — Evaluation (Signed)
Physical Therapy Evaluation Patient Details Name: [name] MRN: [id number] DOB: Jul 13, 1940 Today's Date: 11/11/2018   History of Present Illness  [name]  is a 78 y.o. female  w hypertension, hyperlipidemia, Dm2, Pafib,  h/o pulmonary embolism, anemia, Gerd, Anxiety, nephrolithiasis,  Dementia, who presents with intermittent chest pain for the past 4 weeks. Chest pain substernal to left sided, "sharp" intermittent. Slight dyspnea. Slight cough (dry),  Pt found to have UTI.  Clinical Impression  Pt admitted with above. Pt reports getting assist with all ADLs and mobility prior to admit as well as minimal walking due to onset of DOE. Pt lives a sedentary lifestyle and stays in her room majority of time, even to eat meals. Pt functioning at min guard/minA level. Pt appears near baseline. Pt c/o SOB and "It feels like I need to stretch my throat, there is always something sticking there."  SpO2 >98% t/o session on RA. Acute PT to cont to follow.    Follow Up Recommendations Home health PT;Supervision/Assistance - 24 hour    Equipment Recommendations  None recommended by PT    Recommendations for Other Services       Precautions / Restrictions Precautions Precautions: Fall Restrictions Weight Bearing Restrictions: No      Mobility  Bed Mobility Overal bed mobility: Needs Assistance Bed Mobility: Supine to Sit     Supine to sit: Supervision     General bed mobility comments: hob elevated, pt states she has a hospital bed at home, increased time but no physical assist required  Transfers Overall transfer level: Needs assistance Equipment used: Rolling walker (2 wheeled) Transfers: Sit to/from [facility] Sit to Stand: Min guard Stand pivot transfers: Min guard       General transfer comment: increased time, min guard for safety as patient incontinent of urine when getting up, minA for walker management due to tight space to transfer to  [facility]  Ambulation/Gait         Gait velocity: slow   General Gait Details: pt deferred saying, "I don't walk far because I get short of breath." I stay in my room"  Stairs            Wheelchair Mobility    Modified Rankin (Stroke Patients Only)       Balance Overall balance assessment: Needs assistance Sitting-balance support: Feet supported;No upper extremity supported Sitting balance-Leahy Scale: Good     Standing balance support: Bilateral upper extremity supported Standing balance-Leahy Scale: Fair Standing balance comment: pt able to perform pericare in standing with single UE support                             Pertinent Vitals/Pain      Home Living Family/patient expects to be discharged to:: Private residence Living Arrangements: Children Available Help at Discharge: Family;Available 24 hours/day(dtr during day, friend at night) Type of Home: Apartment Home Access: Stairs to enter;Level entry Entrance Stairs-Rails: Left Entrance Stairs-Number of Steps: 3 Home Layout: One level Home Equipment: Walker - 2 wheels;Cane - single point;Shower seat;Bedside commode;Wheelchair - manual;Grab bars - tub/shower;Hospital bed      Prior Function Level of Independence: Needs assistance   Gait / Transfers Assistance Needed: limited household amb with RW, stays in room most of the time, rarely goes to kitchen as it's too far away  ADL's / Homemaking Assistance Needed: assisted by her daughter with bathintg and dressing and meals  Hand Dominance   Dominant Hand: Right    Extremity/Trunk Assessment   Upper Extremity Assessment Upper Extremity Assessment: Generalized weakness    Lower Extremity Assessment Lower Extremity Assessment: Generalized weakness    Cervical / Trunk Assessment Cervical / Trunk Assessment: Kyphotic  Communication   Communication: No difficulties  Cognition Arousal/Alertness: Awake/alert Behavior During Therapy:  Flat affect Overall Cognitive Status: Impaired/Different from baseline Area of Impairment: Memory;Problem solving                     Memory: Decreased short-term memory       Problem Solving: Slow processing;Requires verbal cues;Requires tactile cues        General Comments General comments (skin integrity, edema, etc.): VSS, SpO2 >98% on RA    Exercises     Assessment/Plan    PT Assessment Patient needs continued PT services  PT Problem List Decreased strength;Decreased activity tolerance;Decreased balance;Decreased mobility;Decreased coordination;Decreased knowledge of use of DME       PT Treatment Interventions DME instruction;Gait training;Stair training;Functional mobility training;Therapeutic activities;Therapeutic exercise;Balance training;Neuromuscular re-education    PT Goals (Current goals can be found in the Care Plan section)  Acute Rehab PT Goals Patient Stated Goal: home today PT Goal Formulation: With patient Time For Goal Achievement: 11/25/18 Potential to Achieve Goals: Good    Frequency Min 3X/week   Barriers to discharge        Co-evaluation               AM-PAC PT "6 Clicks" Mobility  Outcome Measure Help needed turning from your back to your side while in a flat bed without using bedrails?: A Little Help needed moving from lying on your back to sitting on the side of a flat bed without using bedrails?: A Little Help needed moving to and from a bed to a chair (including a wheelchair)?: A Little Help needed standing up from a chair using your arms (e.g., wheelchair or bedside chair)?: A Little Help needed to walk in hospital room?: A Lot Help needed climbing 3-5 steps with a railing? : A Lot 6 Click Score: 16    End of Session Equipment Utilized During Treatment: Gait belt Activity Tolerance: Patient tolerated treatment well Patient left: in chair;with call bell/phone within reach;with nursing/sitter in room Nurse  Communication: Mobility status PT Visit Diagnosis: Unsteadiness on feet (R26.81);Muscle weakness (generalized) (M62.81);Difficulty in walking, not elsewhere classified (R26.2)    Time: [id number] PT Time Calculation (min) (ACUTE ONLY): 27 min   Charges:   PT Evaluation $PT Eval Moderate Complexity: 1 Mod PT Treatments $Therapeutic Activity: 8-22 mins        Kittie Plater, PT, DPT Acute Rehabilitation Services Pager #: [phone] Office #: [phone]   Berline Lopes 11/11/2018, 3:19 PM

## 2018-11-11 NOTE — Progress Notes (Signed)
Pt complaints of trouble swallowing, stated it's been like that for a while, feels like choking. Pt took a sip of water with RN just fine. Kc, MD made aware of her complaint. Speech Eval ordered.

## 2018-11-11 NOTE — Progress Notes (Signed)
PROGRESS NOTE    [name]  [id number] DOB: Jun 27, 1940 DOA: 11/10/2018 PCP: [name], MD   Brief Narrative:   78 year old female with hypertension hyperlipidemia type 2 diabetes PE EF, history of PE, anemia, GERD, anxiety, nephrolithiasis, dementia who lives with daughter brought to the ER for evaluation of chest pain going on for 4 weeks, left-sided, intermittent sharp in nature with some dry cough and slight dyspnea  In ER:T 98.2, P 113, R 17, Bp 146/81  Pox 95% on RA, Wt 104.3kg CTA chest: No pulmonary embolus or acute intrathoracic abnormality. Labs: Creatinine 1.21,Alb 3.4, normal LFTs, Urinalysis wbc >50, rbc 21-50, Wbc 10.6, Hgb 8.1, Plt 624, Trop 7 Ekg ST at 110, nl axis, nl int, no st-t changes c/w ischemia She was admitted for further management of chest pain and acute lower UTI  Subjective: Seen examined this morning. Admitted last night at 11 pm  RN at bedside She c/o left sided chest pain. C/o dysurea and suprapubic pain  Assessment & Plan:   Chest pain:Serial troponins stable 7-->8.EKG no evidence of acute ischemia.  Monitoring telemetry, follow-up echocardiogram and consult cardiology today. negative stress test in 2018. No further cardiac work up per cardiology if Echo normal.Pain is reproducible with pain.   Add Percocet, p.r.n..  Has codeine allergy but has tolerated  perocet previously.  Acute UTI:UA is grossly abnormal: she is symptomatic with dysurea nad suprapubic pain/tenerness and fever at home. 7 months she was admitted at [facility]: She had hydronephrosis that needed cystoscopy, b/l retrograde pyelogram, b/l ureteral stent, bladder biopsy fulguration 0.5-2 cm by urology and 2-3 days post op had UTI. Asked RN to send urine culture- was not sent from ER.  Choking sensation in throat while eating- going on for sometime per patient and she is concerned consulted  SLP for Eval.  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  Anemia NOS: Hb appears  low at 8.1, microcytic- hb has been at 7.8-9.1 gm.  Chronic, o/p follow up  Dyslipidemia:Continue Lipitor  Migraine:Continue topiramate  Essential hypertension:Blood pressure controlled, continue amlodipine, Catapres.  Monitor.  CKD stage 3 baseline creatinine 1.1-1.4, stable at 1.2.  Monitor  Diabetes mellitus on long-term insulin, hemoglobin A1c controlled at 8.0 back in May. Sugar stable, cont sliding scale insulin. Continue to hold insulin 70/30.  Anxiety/Depression on multiple medication:BuSpar/fluoxetine/Aricept/Zofran/Vistaril. Continue the same.  GERD on PPI  Morbid obesity with Body mass index is 35.51 kg/m.   Chronic back pain- per sister could not have surgery on back has pain. Keep on fall precautions. PT eval.  Mild dementia per sister- fairly stable and cognitive  DVT prophylaxis: Pradaxa. Code Status: DNR Family Communication: plan of care discussed with patient in detail.  I discussed with patient's sister over the phone.  Disposition Plan: remains hospitalized, awaiting improvement in chest pain, urine culture report.Obtain PT eval.  Consultants: cardiology  Procedures:none  Echo 1. Left ventricular ejection fraction, by visual estimation, is 60 to 65%. The left ventricle has normal function. Normal left ventricular size. There is no left ventricular hypertrophy.  2. Left ventricular diastolic Doppler parameters are consistent with impaired relaxation pattern of LV diastolic filling.  3. Global right ventricle has normal systolic function.The right ventricular size is normal. No increase in right ventricular wall thickness.  4. Left atrial size was normal.  5. Right atrial size was normal.  6. The mitral valve is normal in structure. No evidence of mitral valve regurgitation. No evidence of mitral stenosis.  7. The  tricuspid valve is normal in structure. Tricuspid valve regurgitation was not visualized by color flow Doppler.  8. The aortic valve is normal in  structure. Aortic valve regurgitation is trivial by color flow Doppler. Mild aortic valve sclerosis without stenosis.  9. The pulmonic valve was normal in structure. Pulmonic valve regurgitation is not visualized by color flow Doppler. 10. The inferior vena cava is normal in size with greater than 50% respiratory variability, suggesting right atrial pressure of 3 mmHg.  Microbiology: urine culture- pending covid 19 neg  Antimicrobials: Anti-infectives (From admission, onward)   Start     Dose/Rate Route Frequency Ordered Stop   11/10/18 2030  ciprofloxacin (CIPRO) IVPB 400 mg     400 mg 200 mL/hr over 60 Minutes Intravenous  Once 11/10/18 2027 11/10/18 2206       Objective: Vitals:   11/11/18 0330 11/11/18 0400 11/11/18 0430 11/11/18 0541  BP: 135/73 138/77 (!) 143/75 (!) 152/91  Pulse: 97   98  Resp: (!) 21 (!) 21 (!) 22 20  Temp:    98.5 F (36.9 C)  TempSrc:    Oral  SpO2: 92%   99%  Weight:    102.8 kg  Height:    5\' 7"  (1.702 m)    Intake/Output Summary (Last 24 hours) at 11/11/2018 0719 Last data filed at 11/11/2018 [id number] Gross per 24 hour  Intake 50 ml  Output -  Net 50 ml   Filed Weights   11/10/18 1206 11/11/18 0541  Weight: 104.3 kg 102.8 kg   Weight change:   Body mass index is 35.51 kg/m.  Intake/Output from previous day: 09/25 0701 - 09/26 0700 In: 50 [P.O.:50] Out: -  Intake/Output this shift: No intake/output data recorded.  Examination:  General exam: Appears calm and comfortable, elderly,frail, not in distress, older fore the age HEENT:PERRL,Oral mucosa moist, Ear/Nose normal on gross exam Respiratory system: Bilateral equal air entry, normal vesicular breath sounds, no wheezes or crackles  Cardiovascular system: S1 & S2 heard,No JVD, murmurs. Left chest tender to palpation Gastrointestinal system: Abdomen is  soft, non tender, non distended, BS +  Nervous System:Alert and oriented. No focal neurological deficits/moving extremities,  sensation intact. Extremities: No edema, no clubbing, distal peripheral pulses palpable. Skin: No rashes, lesions, no icterus MSK: Normal muscle bulk,tone ,power  Medications:  Scheduled Meds: . amLODipine  10 mg Oral Daily  . atorvastatin  40 mg Oral Daily  . brimonidine  1 drop Both Eyes BID  . busPIRone  7.5 mg Oral TID  . cloNIDine  0.2 mg Oral QHS  . colchicine  0.6 mg Oral Daily  . dabigatran  150 mg Oral BID  . donepezil  10 mg Oral QHS  . FLUoxetine  10 mg Oral Daily  . imipramine  100 mg Oral QHS  . [START ON 11/12/2018] influenza vaccine adjuvanted  0.5 mL Intramuscular Tomorrow-1000  . insulin aspart  0-9 Units Subcutaneous Q4H  . montelukast  10 mg Oral QHS  . olopatadine  1 drop Both Eyes BID  . pantoprazole  40 mg Oral Daily  . polyethylene glycol  17 g Oral Daily  . senna-docusate  2 tablet Oral BID  . temazepam  30 mg Oral QHS  . timolol  1 drop Both Eyes BID  . topiramate  100 mg Oral BID   Continuous Infusions: . sodium chloride      Data Reviewed: I have personally reviewed following labs and imaging studies  CBC: Recent Labs  Lab 11/10/18 1518  WBC 10.6*  HGB 8.1*  HCT 29.7*  MCV 76.7*  PLT [id number]*   Basic Metabolic Panel: Recent Labs  Lab 11/10/18 1518  NA 138  K 4.0  CL 107  CO2 23  GLUCOSE 138*  BUN 21  CREATININE 1.21*  CALCIUM 8.8*   GFR: Estimated Creatinine Clearance: 46.5 mL/min (A) (by C-G formula based on SCr of 1.21 mg/dL (H)). Liver Function Tests: Recent Labs  Lab 11/10/18 1518  AST 9*  ALT 9  ALKPHOS 95  BILITOT 0.1*  PROT 8.0  ALBUMIN 3.4*   No results for input(s): LIPASE, AMYLASE in the last 168 hours. No results for input(s): AMMONIA in the last 168 hours. Coagulation Profile: No results for input(s): INR, PROTIME in the last 168 hours. Cardiac Enzymes: No results for input(s): CKTOTAL, CKMB, CKMBINDEX, TROPONINI in the last 168 hours. BNP (last 3 results) No results for input(s): PROBNP in the last 8760  hours. HbA1C: No results for input(s): HGBA1C in the last 72 hours. CBG: Recent Labs  Lab 11/11/18 0104 11/11/18 0411  GLUCAP 241* 266*   Lipid Profile: No results for input(s): CHOL, HDL, LDLCALC, TRIG, CHOLHDL, LDLDIRECT in the last 72 hours. Thyroid Function Tests: No results for input(s): TSH, T4TOTAL, FREET4, T3FREE, THYROIDAB in the last 72 hours. Anemia Panel: No results for input(s): VITAMINB12, FOLATE, FERRITIN, TIBC, IRON, RETICCTPCT in the last 72 hours. Sepsis Labs: No results for input(s): PROCALCITON, LATICACIDVEN in the last 168 hours.  Recent Results (from the past 240 hour(s))  SARS Coronavirus 2 [facility] order, Performed in [facility] hospital lab) Nasopharyngeal Nasopharyngeal Swab     Status: None   Collection Time: 11/10/18 10:33 PM   Specimen: Nasopharyngeal Swab  Result Value Ref Range Status   SARS Coronavirus 2 NEGATIVE NEGATIVE Final    Comment: (NOTE) If result is NEGATIVE SARS-CoV-2 target nucleic acids are NOT DETECTED. The SARS-CoV-2 RNA is generally detectable in upper and lower  respiratory specimens during the acute phase of infection. The lowest  concentration of SARS-CoV-2 viral copies this assay can detect is 250  copies / mL. A negative result does not preclude SARS-CoV-2 infection  and should not be used as the sole basis for treatment or other  patient management decisions.  A negative result may occur with  improper specimen collection / handling, submission of specimen other  than nasopharyngeal swab, presence of viral mutation(s) within the  areas targeted by this assay, and inadequate number of viral copies  (<250 copies / mL). A negative result must be combined with clinical  observations, patient history, and epidemiological information. If result is POSITIVE SARS-CoV-2 target nucleic acids are DETECTED. The SARS-CoV-2 RNA is generally detectable in upper and lower  respiratory specimens dur ing the acute phase of infection.   Positive  results are indicative of active infection with SARS-CoV-2.  Clinical  correlation with patient history and other diagnostic information is  necessary to determine patient infection status.  Positive results do  not rule out bacterial infection or co-infection with other viruses. If result is PRESUMPTIVE POSTIVE SARS-CoV-2 nucleic acids MAY BE PRESENT.   A presumptive positive result was obtained on the submitted specimen  and confirmed on repeat testing.  While 2019 novel coronavirus  (SARS-CoV-2) nucleic acids may be present in the submitted sample  additional confirmatory testing may be necessary for epidemiological  and / or clinical management purposes  to differentiate between  SARS-CoV-2 and other Sarbecovirus currently known to infect humans.  If clinically indicated additional  testing with an alternate test  methodology [phone]) is advised. The SARS-CoV-2 RNA is generally  detectable in upper and lower respiratory sp ecimens during the acute  phase of infection. The expected result is Negative. Fact Sheet for Patients:  StrictlyIdeas.no Fact Sheet for Healthcare Providers: BankingDealers.co.za This test is not yet approved or cleared by the [place] FDA and has been authorized for detection and/or diagnosis of SARS-CoV-2 by FDA under an Emergency Use Authorization (EUA).  This EUA will remain in effect (meaning this test can be used) for the duration of the COVID-19 declaration under Section 564(b)(1) of the Act, 21 U.S.C. section 360bbb-3(b)(1), unless the authorization is terminated or revoked sooner. Performed at [facility], [address], [place], [place] [address]       Radiology Studies: Dg Chest 2 View  Result Date: 11/10/2018 CLINICAL DATA:  Cough, shortness of breath, and chest pain for 1 month, history hypertension, diabetes mellitus, atrial fibrillation, pulmonary embolism. EXAM: CHEST - 2 VIEW  COMPARISON:  03/17/2018 FINDINGS: Upper normal heart size. Calcified tortuous aorta. Mediastinal contours and pulmonary vascularity otherwise normal. Mild elevation of LEFT diaphragm. Lungs clear. No infiltrate, pleural effusion or pneumothorax. Bones unremarkable. IMPRESSION: No acute abnormalities. Electronically Signed   By: Lavonia Dana M.D.   On: 11/10/2018 13:12   Ct Angio Chest Pe W And/or Wo Contrast  Result Date: 11/10/2018 CLINICAL DATA:  PE suspected, high pretest prob. Left-sided chest pain and shortness of breath. EXAM: CT ANGIOGRAPHY CHEST WITH CONTRAST TECHNIQUE: Multidetector CT imaging of the chest was performed using the standard protocol during bolus administration of intravenous contrast. Multiplanar CT image reconstructions and MIPs were obtained to evaluate the vascular anatomy. CONTRAST:  73mL OMNIPAQUE IOHEXOL 350 MG/ML SOLN COMPARISON:  Radiograph earlier this day. Chest CT [id number] FINDINGS: Cardiovascular: There are no filling defects within the pulmonary arteries to suggest pulmonary embolus. The thoracic aorta is normal in caliber without dissection. Mild aortic tortuosity and atherosclerosis. Borderline cardiomegaly. No pericardial effusion. Mediastinum/Nodes: No enlarged mediastinal or hilar lymph nodes. Small hiatal hernia, esophagus is otherwise decompressed. No visualized thyroid nodule. Lungs/Pleura: Calcified granuloma in the right upper lobe. No focal airspace disease. No pleural fluid or pulmonary edema. Mild breathing motion artifact in the lung bases. Trachea and mainstem bronchi are patent. Upper Abdomen: No acute findings. Musculoskeletal: There are no acute or suspicious osseous abnormalities. Review of the MIP images confirms the above findings. IMPRESSION: No pulmonary embolus or acute intrathoracic abnormality. Aortic Atherosclerosis (ICD10-I70.0). Electronically Signed   By: Keith Rake M.D.   On: 11/10/2018 21:50      LOS: 0 days   Time spent: More than  50% of that time was spent in counseling and/or coordination of care.  Antonieta Pert, MD [facility]  11/11/2018, 7:19 AM

## 2018-11-11 NOTE — Consult Note (Signed)
Cardiology Consultation:   Patient ID: [name] MRN: [id number]; DOB: December 18, 1940  Admit date: 11/10/2018 Date of Consult: 11/11/2018  Primary Care Provider: [name], MD Primary Cardiologist: Carlyle Dolly, MD  Primary Electrophysiologist:  None    Patient Profile:   [name] is a 78 y.o. female with a hx of PE, HTN, HL  who is being seen today for the evaluation of chest pain at the request of Dr Maudie Mercury.  History of Present Illness:   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 History of HTN, HL, chronic anemia, and prior chest pain with nuclear stress test 10/2016 without ischemia  Presents with chest pain, nausea, fatigue.. ER notes describe left sided sharp pain worst with deep breaths.   To me she reports chest going on "for some time". Left sided pressure can be 10/10, can come on at rest or with exertion. Has some associated SOB. Can last hours at a time. Worst with breathing, coughing, and position. Also over the last few days has had some fevers, hematuria, nausea with dry heaves but no vomiting.      K 4 Cr 1.21 BUN 21 WBC 10.6 Hgb 8.1 hstrop 7-->8--> EKG sinus tach COVID neg  CXR no acute process CT PE no PE 10/2016 nuclear stress: no ischemia, nonspecific elevated TID in setting of no perfusion defects     Heart Pathway Score:  HEAR Score: 4  Past Medical History:  Diagnosis Date   Anemia, iron deficiency 2012   Evaluated by Dr. Oneida Alar; H&H of 9.3/30.8 with [place] in 10/2010; 3/3 positive Hemoccult cards in 07/2011   Anxiety    was on Prozac for a couple of weeks   Atrial fibrillation ([place])    takes Coumadin daily   Bruises easily    takes Coumadin   Chronic anticoagulation 07/21/2010   Chronic back pain    related to knee pain   Degenerative joint disease    Knees   Dementia ([place])    Depression    Diabetes mellitus    takes Metformin daily   Gastroesophageal reflux disease    takes Omeprazole daily   Glaucoma    Glaucoma    uses eye drops at night   History of blood transfusion 1969   History of gout    was on medication but taken off of several months ago   Hx of colonic polyps    Hx of migraines    last one about a yr ago;takes Topamax daily   Hyperlipidemia    takes Pravastatin daily   Hypertension    takes Hyzaar daily and Propranolol    Insomnia    takes Restoril prn   Joint pain    Joint swelling    Memory loss    takes donepezil   Migraine    Migraine    Nocturia    Overweight(278.02)    Pneumonia 1969   hx of   Pulmonary embolism ([place]) May 2012   acute presentation, bilateral PE   Skin spots-aging    Urinary frequency     Past Surgical History:  Procedure Laterality Date   CATARACT EXTRACTION W/PHACO Right 04/11/2012   Procedure: CATARACT EXTRACTION PHACO AND INTRAOCULAR LENS PLACEMENT ([place]);  Surgeon: Elta Guadeloupe T. Gershon Crane, MD;  Location: [facility];  Service: Ophthalmology;  Laterality: Right;  CDE=9.61   CATARACT EXTRACTION W/PHACO Left 12/26/2012   Procedure: CATARACT EXTRACTION  PHACO AND INTRAOCULAR LENS PLACEMENT ([facility]);  Surgeon: Elta Guadeloupe T. Gershon Crane, MD;  Location: [facility];  Service: Ophthalmology;  Laterality: Left;  CDE:7.74   COLONOSCOPY  Jan 2002; 2012   2002: Dr. Tamala Julian, ext. hemorrhoids, nl colon; 2012-adenomatous polyps, gastritis on EGD   CYSTOSCOPY W/ URETERAL STENT PLACEMENT Bilateral 02/25/2018   Procedure: CYSTOSCOPY WITH BILATERAL RETROGRADE PYELOGRAM/BILATERAL URETERAL STENT PLACEMENT, BLADDER BIOPSIES WITH FULGERATION;  Surgeon: [name], MD;  Location: [facility];  Service: Urology;  Laterality: Bilateral;   CYSTOSCOPY/URETEROSCOPY/HOLMIUM LASER/STENT PLACEMENT Bilateral 03/21/2018   Procedure: CYSTOSCOPY/URETEROSCOPY/HOLMIUM LASER/STENT PLACEMENT;  Surgeon: [name], MD;  Location: [facility];  Service: Urology;   Laterality: Bilateral;  ONLY NEEDS 60 MIN   DILATION AND CURETTAGE OF UTERUS     ESOPHAGOGASTRODUODENOSCOPY  08/24/2011   ESOPHAGOGASTRODUODENOSCOPY; esophageal dilatation; Rogene Houston, MD;   GIVENS CAPSULE STUDY  08/18/2011   Procedure: GIVENS CAPSULE STUDY;  Surgeon: Rogene Houston, MD;  Location: [facility];  Service: Endoscopy;  Laterality: N/A;  730   KNEE ARTHROSCOPY W/ MENISCECTOMY  90's   Left   ORIF ANKLE FRACTURE Right 90's   TONSILLECTOMY     TOTAL KNEE ARTHROPLASTY  10/20/2011   Procedure: TOTAL KNEE ARTHROPLASTY;  Surgeon: Ninetta Lights, MD;  Location: [facility];  Service: Orthopedics;  Laterality: Left;   UPPER GASTROINTESTINAL ENDOSCOPY     URETHRAL DILATION        Inpatient Medications: Scheduled Meds:  amLODipine  10 mg Oral Daily   atorvastatin  40 mg Oral Daily   brimonidine  1 drop Both Eyes BID   busPIRone  7.5 mg Oral TID   cloNIDine  0.2 mg Oral QHS   colchicine  0.6 mg Oral Daily   dabigatran  150 mg Oral BID   donepezil  10 mg Oral QHS   FLUoxetine  10 mg Oral Daily   imipramine  100 mg Oral QHS   [START ON 11/12/2018] influenza vaccine adjuvanted  0.5 mL Intramuscular Tomorrow-1000   insulin aspart  0-9 Units Subcutaneous Q4H   montelukast  10 mg Oral QHS   olopatadine  1 drop Both Eyes BID   pantoprazole  40 mg Oral Daily   polyethylene glycol  17 g Oral Daily   senna-docusate  2 tablet Oral BID   sodium chloride flush  3 mL Intravenous Q12H   temazepam  30 mg Oral QHS   timolol  1 drop Both Eyes BID   topiramate  100 mg Oral BID   Continuous Infusions:  sodium chloride     cefTRIAXone (ROCEPHIN)  IV     PRN Meds: sodium chloride, acetaminophen **OR** acetaminophen, albuterol, docusate sodium, meclizine, ondansetron (ZOFRAN) IV, sodium chloride flush  Allergies:    Allergies  Allergen Reactions   Penicillins Itching    Did it involve swelling of the face/tongue/throat, SOB, or low BP? No Did it involve  sudden or severe rash/hives, skin peeling, or any reaction on the inside of your mouth or nose? No Did you need to seek medical attention at a hospital or doctor's office? No When did it last happen?many years ago If all above answers are NO, may proceed with cephalosporin use.     Fentanyl Itching   Sulfonamide Derivatives Hives   Codeine Itching and Rash    tussionex  Is tolerated by patient, no phenergan dm     Social History:   Social History   Socioeconomic History   Marital status: Widowed    Spouse name: Not  on file   Number of children: 2   Years of education: 11   Highest education level: Not on file  Occupational History   Occupation: retired     Comment: Licensed conveyancer Surveyor, [employer])    Employer: RETIRED  Social Needs   Financial resource strain: Not hard at all   Food insecurity    Worry: Never true    Inability: Never true   Transportation needs    Medical: No    Non-medical: No  Tobacco Use   Smoking status: Never Smoker   Smokeless tobacco: Never Used  Substance and Sexual Activity   Alcohol use: No   Drug use: No   Sexual activity: Not Currently    Birth control/protection: Post-menopausal  Lifestyle   Physical activity    Days per week: 0 days    Minutes per session: 0 min   Stress: Very much  Relationships   Social connections    Talks on phone: More than three times a week    Gets together: More than three times a week    Attends religious service: 1 to 4 times per year    Active member of club or organization: No    Attends meetings of clubs or organizations: Never    Relationship status: Widowed   Intimate partner violence    Fear of current or ex partner: No    Emotionally abused: No    Physically abused: No    Forced sexual activity: No  Other Topics Concern   Not on file  Social History Narrative   Lives at home with husband.   Right-handed.   1 cup caffeine per day.    Family History:    Family History    Problem Relation Age of Onset   Diabetes Mother    Hypertension Mother    Arthritis Mother    Heart failure Mother    Migraines Mother    Kidney failure Mother    Leukemia Father    Migraines Father    Kidney failure Father    Diabetes Sister    Hypertension Sister    Diabetes Brother    Hypertension Brother    Hypertension Brother    Hypertension Brother    Hypertension Brother    Diabetes Brother    Diabetes Brother    Diabetes Brother    Pulmonary embolism Sister    Migraines Sister    Kidney failure Brother    Colon cancer Neg Hx    Colon polyps Neg Hx      ROS:  Please see the history of present illness.   All other ROS reviewed and negative.     Physical Exam/Data:   Vitals:   11/11/18 0400 11/11/18 0430 11/11/18 0541 11/11/18 0751  BP: 138/77 (!) 143/75 (!) 152/91 (!) 156/87  Pulse:   98 85  Resp: (!) 21 (!) 22 20 19   Temp:   98.5 F (36.9 C) 98.2 F (36.8 C)  TempSrc:   Oral Oral  SpO2:   99% 99%  Weight:   102.8 kg   Height:   5\' 7"  (1.702 m)     Intake/Output Summary (Last 24 hours) at 11/11/2018 0805 Last data filed at 11/11/2018 [id number] Gross per 24 hour  Intake 50 ml  Output --  Net 50 ml   Last 3 Weights 11/11/2018 11/10/2018 09/27/2018  Weight (lbs) 226 lb 11.2 oz 230 lb 233 lb  Weight (kg) 102.83 kg 104.327 kg 105.688 kg     Body mass  index is 35.51 kg/m.  General:  Well nourished, well developed, in no acute distress HEENT: normal Lymph: no adenopathy Neck: no JVD Endocrine:  No thryomegaly Cardiac:  normal S1, S2; RRR; 2/6 systolic murmur rusb Lungs:  clear to auscultation bilaterally, no wheezing, rhonchi or rales  Abd: soft, nontender, no hepatomegaly  Ext: no edema Musculoskeletal:  No deformities, BUE and BLE strength normal and equal. Left chest wall tender to palpation Skin: warm and dry  Neuro:  CNs 2-12 intact, no focal abnormalities noted Psych:  Normal affect    Laboratory Data:  High  Sensitivity Troponin:   Recent Labs  Lab 11/10/18 1518 11/10/18 1658  TROPONINIHS 7 8     Chemistry Recent Labs  Lab 11/10/18 1518  NA 138  K 4.0  CL 107  CO2 23  GLUCOSE 138*  BUN 21  CREATININE 1.21*  CALCIUM 8.8*  GFRNONAA 43*  GFRAA 49*  ANIONGAP 8    Recent Labs  Lab 11/10/18 1518  PROT 8.0  ALBUMIN 3.4*  AST 9*  ALT 9  ALKPHOS 95  BILITOT 0.1*   Hematology Recent Labs  Lab 11/10/18 1518 11/11/18 0741  WBC 10.6* 11.4*  RBC 3.87 3.68*  HGB 8.1* 7.9*  HCT 29.7* 27.4*  MCV 76.7* 74.5*  MCH 20.9* 21.5*  MCHC 27.3* 28.8*  RDW 19.8* 19.4*  PLT 624* 512*   BNPNo results for input(s): BNP, PROBNP in the last 168 hours.  DDimer No results for input(s): DDIMER in the last 168 hours.   Radiology/Studies:  Dg Chest 2 View  Result Date: 11/10/2018 CLINICAL DATA:  Cough, shortness of breath, and chest pain for 1 month, history hypertension, diabetes mellitus, atrial fibrillation, pulmonary embolism. EXAM: CHEST - 2 VIEW COMPARISON:  03/17/2018 FINDINGS: Upper normal heart size. Calcified tortuous aorta. Mediastinal contours and pulmonary vascularity otherwise normal. Mild elevation of LEFT diaphragm. Lungs clear. No infiltrate, pleural effusion or pneumothorax. Bones unremarkable. IMPRESSION: No acute abnormalities. Electronically Signed   By: Lavonia Dana M.D.   On: 11/10/2018 13:12   Ct Angio Chest Pe W And/or Wo Contrast  Result Date: 11/10/2018 CLINICAL DATA:  PE suspected, high pretest prob. Left-sided chest pain and shortness of breath. EXAM: CT ANGIOGRAPHY CHEST WITH CONTRAST TECHNIQUE: Multidetector CT imaging of the chest was performed using the standard protocol during bolus administration of intravenous contrast. Multiplanar CT image reconstructions and MIPs were obtained to evaluate the vascular anatomy. CONTRAST:  26mL OMNIPAQUE IOHEXOL 350 MG/ML SOLN COMPARISON:  Radiograph earlier this day. Chest CT [id number] FINDINGS: Cardiovascular: There are no filling  defects within the pulmonary arteries to suggest pulmonary embolus. The thoracic aorta is normal in caliber without dissection. Mild aortic tortuosity and atherosclerosis. Borderline cardiomegaly. No pericardial effusion. Mediastinum/Nodes: No enlarged mediastinal or hilar lymph nodes. Small hiatal hernia, esophagus is otherwise decompressed. No visualized thyroid nodule. Lungs/Pleura: Calcified granuloma in the right upper lobe. No focal airspace disease. No pleural fluid or pulmonary edema. Mild breathing motion artifact in the lung bases. Trachea and mainstem bronchi are patent. Upper Abdomen: No acute findings. Musculoskeletal: There are no acute or suspicious osseous abnormalities. Review of the MIP images confirms the above findings. IMPRESSION: No pulmonary embolus or acute intrathoracic abnormality. Aortic Atherosclerosis (ICD10-I70.0). Electronically Signed   By: Keith Rake M.D.   On: 11/10/2018 21:50    Assessment and Plan:   1. Noncardiac chest pain - prior history of chest pain with negative nuclear stress test in 2018 - no objective evidence of ischemia by EKG  or enzymes - CT PE negative - echo pending  - presents with chest pain lasting hours at a time, worst with coughing/deep breathing/position, and left chest wall is tender to palpation. - f/u echo, if no significant findings would not plan for any further cardiac testing. Signifiacnt nausea and dry heaves perhaps related to her UTI and systemic symptoms perhaps led to some of the chest pain, nothing would suggest this is cardiac.    2. History of PE - she is on anticoag lifelong for unprovoked PE - CT PE this admit negative  3. Anemia - chronic   4. UTI - per primary team   We will f/u echo, if benign we will sign off.   For questions or updates, please contact [place] Please consult [email or web address] for contact info under     Signed, Carlyle Dolly, MD  11/11/2018 8:05 AM

## 2018-11-12 DIAGNOSIS — R079 Chest pain, unspecified: Secondary | ICD-10-CM | POA: Diagnosis not present

## 2018-11-12 DIAGNOSIS — I10 Essential (primary) hypertension: Secondary | ICD-10-CM | POA: Diagnosis not present

## 2018-11-12 LAB — GLUCOSE, CAPILLARY
Glucose-Capillary: 165 mg/dL — ABNORMAL HIGH (ref 70–99)
Glucose-Capillary: 170 mg/dL — ABNORMAL HIGH (ref 70–99)
Glucose-Capillary: 172 mg/dL — ABNORMAL HIGH (ref 70–99)

## 2018-11-12 MED ORDER — CEPHALEXIN 500 MG PO CAPS: 500.0000 mg | ORAL_CAPSULE | Freq: Three times a day (TID) | ORAL | 0 refills | Status: AC

## 2018-11-12 NOTE — Discharge Summary (Signed)
Physician Discharge Summary  [name] DOB: [birth year] DOA: 11/10/2018  PCP: [name], MD  Admit date: 11/10/2018 Discharge date: 11/12/2018  Admitted From: Home  Disposition:  Home with home health   Recommendations for Outpatient Follow-up:  1. Follow up with PCP in 1-2 weeks to evaluate for resolution of UTI symptoms   Home Health: Yes  Equipment/Devices: None new  Discharge Condition: Fair  CODE STATUS: FULL Diet recommendation: Diabetic, cardiac  Brief/Interim Summary: [name] is a 78 y.o. F with DM, dCHF, history PE, and mild dementia who presented with 4 weeks intermittent  non-exertional left sided chest pain, with cough as well as new weakness and malaise and dysuria.  In the ER, CTA negative for PE or pneumonia.  Troponins negative.  UA suggested UTI.       PRINCIPAL HOSPITAL DIAGNOSIS: UTI    Discharge Diagnoses:    UTI Patient unable to provide urine sample.  She improved on Ceftriaxone and was discharged to complete a course of cephalexin.  Noncardiac chest pain Evaluated by Cardiology.  2018 nuclear stress negative for ishcemia. CTA chest negative for PE. Troponins normal.  Echocardiogram normal.  No further ishcemic work up.          Discharge Instructions  Discharge Instructions    Discharge instructions   Complete by: As directed    From Dr. Loleta Books: You were admitted for feeling ill, being dehdyrated and having chest pain. You were evaluated by a heart specialist and had an ultrasound of the heart.  The ultrasound showed no disease of the heart or valves and your blood work was reassuring.  You were also found to have a urinary tract infection (Bladder infection) you were treated with 3 days of antibiotics here, and should finish a five day course by taking: Cephalexin 500 mg three times a day (with breakfast, lunch, dinner) for 2 more days, Monday and Tuesday  You should follow up with your primary care  doctor in 1 week. Discuss with them if you have any ongoing chest discomforts, any persistent symptoms of a bladder infection.   Increase activity slowly   Complete by: As directed      Allergies as of 11/12/2018      Reactions   Penicillins Itching   Did it involve swelling of the face/tongue/throat, SOB, or low BP? No Did it involve sudden or severe rash/hives, skin peeling, or any reaction on the inside of your mouth or nose? No Did you need to seek medical attention at a hospital or doctor's office? No When did it last happen?many years ago If all above answers are "NO", may proceed with cephalosporin use.   Fentanyl Itching   Sulfonamide Derivatives Hives   Codeine Itching, Rash   tussionex  Is tolerated by patient, no phenergan dm       Medication List    TAKE these medications   Accu-Chek Aviva Plus test strip Generic drug: glucose blood USE TO TEST BLOOD SUAGR 2 TIMES A DAY   Accu-Chek Aviva Plus w/Device Kit For once daily testing dx E11.9   acetaminophen 500 MG tablet Commonly known as: TYLENOL Take one tablet two times daily for chronic pain   albuterol 108 (90 Base) MCG/ACT inhaler Commonly known as: Ventolin HFA Inhale 2 puffs into the lungs every 6 (six) hours as needed for wheezing or shortness of breath.   amLODipine 10 MG tablet Commonly known as: NORVASC Take 1 tablet (10 mg total) by mouth daily.  atorvastatin 40 MG tablet Commonly known as: LIPITOR TAKE 1 TABLET BY MOUTH EVERY DAY   BD Veo Insulin Syringe U/F 31G X 15/64" 0.5 ML Misc Generic drug: Insulin Syringe-Needle U-100 Use to inject Novolin twice daily   busPIRone 7.5 MG tablet Commonly known as: BUSPAR TAKE 1 TABLET BY MOUTH 3 TIMES DAILY   cephALEXin 500 MG capsule Commonly known as: KEFLEX Take 1 capsule (500 mg total) by mouth 3 (three) times daily for 2 days.   cloNIDine 0.2 MG tablet Commonly known as: CATAPRES Take 1 tablet (0.2 mg total) by mouth at bedtime.    clotrimazole-betamethasone cream Commonly known as: Lotrisone Apply 1 application topically 2 (two) times daily. D/c vusion ointment   colchicine 0.6 MG tablet Take 1 tablet (0.6 mg total) by mouth daily. May decrease to once per day as needed for diarrhea   Combigan 0.2-0.5 % ophthalmic solution Generic drug: brimonidine-timolol Place 1 drop into both eyes 2 (two) times daily.   conjugated estrogens vaginal cream Commonly known as: PREMARIN Place vaginally 2 (two) times a week. Apply sparingly ( fingertip)  Twice weekly to vaginal area on Wednesdays and Fridays   dabigatran 150 MG Caps capsule Commonly known as: Pradaxa Take 1 capsule (150 mg total) by mouth 2 (two) times daily.   docusate sodium 100 MG capsule Commonly known as: COLACE Take 1 capsule (100 mg total) by mouth daily as needed for mild constipation.   donepezil 10 MG tablet Commonly known as: ARICEPT Take 1 tablet (10 mg total) by mouth at bedtime.   Fluoxetine HCl (PMDD) 10 MG Tabs Take 1 tablet (10 mg total) by mouth daily.   hydrOXYzine 25 MG capsule Commonly known as: VISTARIL Take 1 capsule (25 mg total) by mouth 3 (three) times daily.   imipramine 50 MG tablet Commonly known as: TOFRANIL TAKE 2 TABLETS (100 MG TOTAL) BY MOUTH AT BEDTIME.   meclizine 25 MG tablet Commonly known as: ANTIVERT TAKE 1 TABLET THREE TIMES DAILY AS NEEDED FOR DIZZINESS.   montelukast 10 MG tablet Commonly known as: SINGULAIR TAKE 1 TABLET BY MOUTH EVERYDAY AT BEDTIME   NovoLIN 70/30 (70-30) 100 UNIT/ML injection Generic drug: insulin NPH-regular Human Inject 40 Units into the skin 2 (two) times daily with a meal.   nystatin powder Commonly known as: MYCOSTATIN/NYSTOP Apply topically daily as needed (for rash under breasts).   olopatadine 0.1 % ophthalmic solution Commonly known as: PATANOL Place 1 drop into both eyes 2 (two) times daily.   omeprazole 40 MG capsule Commonly known as: PRILOSEC TAKE 1 CAPSULE BY  MOUTH TWICE A DAY   oxyCODONE-acetaminophen 10-325 MG tablet Commonly known as: PERCOCET Take 1 tablet 2 times daily by mouth for chronic pain. Start taking on: November 20, 2018   polyethylene glycol 17 g packet Commonly known as: MIRALAX / GLYCOLAX Take 17 g by mouth daily.   senna-docusate 8.6-50 MG tablet Commonly known as: Senokot-S Take 2 tablets by mouth at bedtime. What changed: when to take this   SUMAtriptan 5 MG/ACT nasal spray Commonly known as: IMITREX Place 1 spray into the nose every 2 (two) hours as needed for migraine.   temazepam 30 MG capsule Commonly known as: RESTORIL Take 1 capsule (30 mg total) by mouth at bedtime.   topiramate 100 MG tablet Commonly known as: TOPAMAX TAKE 1 TABLET BY MOUTH TWICE A DAY   Vitamin D (Ergocalciferol) 1.25 MG (50000 UT) Caps capsule Commonly known as: DRISDOL TAKE 1 CAPSULE (50,000 UNITS TOTAL) BY  MOUTH ONCE A WEEK. What changed: See the new instructions.      Follow-up Information    Branch, Alphonse Guild, MD Follow up.   Specialty: Cardiology Why: Office will conatct you for a follow up appointment with Dr Harl Bowie or his PA/NP in [place]. Contact information: [address] [place] [address] [phone]        [facility] Follow up.   Specialty: Home Health Services Why: For home health physical therapy, they will contact you by Tuesday to set up your first home visit.  Contact information: [place] [address] [phone]          Allergies  Allergen Reactions  . Penicillins Itching    Did it involve swelling of the face/tongue/throat, SOB, or low BP? No Did it involve sudden or severe rash/hives, skin peeling, or any reaction on the inside of your mouth or nose? No Did you need to seek medical attention at a hospital or doctor's office? No When did it last happen?many years ago If all above answers are "NO", may proceed with cephalosporin use.     . Fentanyl Itching  . Sulfonamide Derivatives Hives  . Codeine Itching and Rash    tussionex  Is tolerated by patient, no phenergan dm     Consultations:  Cardiology   Procedures/Studies: Dg Chest 2 View  Result Date: 11/10/2018 CLINICAL DATA:  Cough, shortness of breath, and chest pain for 1 month, history hypertension, diabetes mellitus, atrial fibrillation, pulmonary embolism. EXAM: CHEST - 2 VIEW COMPARISON:  03/17/2018 FINDINGS: Upper normal heart size. Calcified tortuous aorta. Mediastinal contours and pulmonary vascularity otherwise normal. Mild elevation of LEFT diaphragm. Lungs clear. No infiltrate, pleural effusion or pneumothorax. Bones unremarkable. IMPRESSION: No acute abnormalities. Electronically Signed   By: Lavonia Dana M.D.   On: 11/10/2018 13:12   Ct Angio Chest Pe W And/or Wo Contrast  Result Date: 11/10/2018 CLINICAL DATA:  PE suspected, high pretest prob. Left-sided chest pain and shortness of breath. EXAM: CT ANGIOGRAPHY CHEST WITH CONTRAST TECHNIQUE: Multidetector CT imaging of the chest was performed using the standard protocol during bolus administration of intravenous contrast. Multiplanar CT image reconstructions and MIPs were obtained to evaluate the vascular anatomy. CONTRAST:  61m OMNIPAQUE IOHEXOL 350 MG/ML SOLN COMPARISON:  Radiograph earlier this day. Chest CT [id number]: Cardiovascular: There are no filling defects within the pulmonary arteries to suggest pulmonary embolus. The thoracic aorta is normal in caliber without dissection. Mild aortic tortuosity and atherosclerosis. Borderline cardiomegaly. No pericardial effusion. Mediastinum/Nodes: No enlarged mediastinal or hilar lymph nodes. Small hiatal hernia, esophagus is otherwise decompressed. No visualized thyroid nodule. Lungs/Pleura: Calcified granuloma in the right upper lobe. No focal airspace disease. No pleural fluid or pulmonary edema. Mild breathing motion artifact in the lung bases. Trachea and  mainstem bronchi are patent. Upper Abdomen: No acute findings. Musculoskeletal: There are no acute or suspicious osseous abnormalities. Review of the MIP images confirms the above findings. IMPRESSION: No pulmonary embolus or acute intrathoracic abnormality. Aortic Atherosclerosis (ICD10-I70.0). Electronically Signed   By: MKeith RakeM.D.   On: 11/10/2018 21:50      Subjective: Feeling well.  Appetite good.  No fever.  Dysuria resolved.  No confusion.  No vomiting.  No more chest pain.  Discharge Exam: Vitals:   11/12/18 0512 11/12/18 0740  BP: (!) 150/82 (!) 147/86  Pulse: 84 87  Resp: 18 19  Temp: 98.4 F (36.9 C) 98.3 F (36.8 C)  SpO2:  100% 96%   Vitals:   11/11/18 2038 11/12/18 0011 11/12/18 0512 11/12/18 0740  BP: (!) 156/90 (!) 159/83 (!) 150/82 (!) 147/86  Pulse: 92 88 84 87  Resp:  20 18 19   Temp: 98.3 F (36.8 C) 98.5 F (36.9 C) 98.4 F (36.9 C) 98.3 F (36.8 C)  TempSrc: Oral Oral Oral Oral  SpO2: 99% 95% 100% 96%  Weight:   101.9 kg   Height:        General: Pt is alert, awake, not in acute distress Cardiovascular: RRR, nl S1-S2, no murmurs appreciated.   No LE edema.   Respiratory: Normal respiratory rate and rhythm.  CTAB without rales or wheezes. Abdominal: Abdomen soft and non-tender.  No distension or HSM.   Neuro/Psych: Strength symmetric in upper and lower extremities.  Judgment and insight appear slightly impaired by dementia.   The results of significant diagnostics from this hospitalization (including imaging, microbiology, ancillary and laboratory) are listed below for reference.     Microbiology: Recent Results (from the past 240 hour(s))  SARS Coronavirus 2 [facility] order, Performed in [facility] hospital lab) Nasopharyngeal Nasopharyngeal Swab     Status: None   Collection Time: 11/10/18 10:33 PM   Specimen: Nasopharyngeal Swab  Result Value Ref Range Status   SARS Coronavirus 2 NEGATIVE NEGATIVE Final    Comment: (NOTE) If  result is NEGATIVE SARS-CoV-2 target nucleic acids are NOT DETECTED. The SARS-CoV-2 RNA is generally detectable in upper and lower  respiratory specimens during the acute phase of infection. The lowest  concentration of SARS-CoV-2 viral copies this assay can detect is 250  copies / mL. A negative result does not preclude SARS-CoV-2 infection  and should not be used as the sole basis for treatment or other  patient management decisions.  A negative result may occur with  improper specimen collection / handling, submission of specimen other  than nasopharyngeal swab, presence of viral mutation(s) within the  areas targeted by this assay, and inadequate number of viral copies  (<250 copies / mL). A negative result must be combined with clinical  observations, patient history, and epidemiological information. If result is POSITIVE SARS-CoV-2 target nucleic acids are DETECTED. The SARS-CoV-2 RNA is generally detectable in upper and lower  respiratory specimens dur ing the acute phase of infection.  Positive  results are indicative of active infection with SARS-CoV-2.  Clinical  correlation with patient history and other diagnostic information is  necessary to determine patient infection status.  Positive results do  not rule out bacterial infection or co-infection with other viruses. If result is PRESUMPTIVE POSTIVE SARS-CoV-2 nucleic acids MAY BE PRESENT.   A presumptive positive result was obtained on the submitted specimen  and confirmed on repeat testing.  While 2019 novel coronavirus  (SARS-CoV-2) nucleic acids may be present in the submitted sample  additional confirmatory testing may be necessary for epidemiological  and / or clinical management purposes  to differentiate between  SARS-CoV-2 and other Sarbecovirus currently known to infect humans.  If clinically indicated additional testing with an alternate test  methodology [phone]) is advised. The SARS-CoV-2 RNA is generally   detectable in upper and lower respiratory sp ecimens during the acute  phase of infection. The expected result is Negative. Fact Sheet for Patients:  StrictlyIdeas.no Fact Sheet for Healthcare Providers: BankingDealers.co.za This test is not yet approved or cleared by the [place] FDA and has been authorized for detection and/or diagnosis of SARS-CoV-2 by FDA under an Emergency Use Authorization (  EUA).  This EUA will remain in effect (meaning this test can be used) for the duration of the COVID-19 declaration under Section 564(b)(1) of the Act, 21 U.S.C. section 360bbb-3(b)(1), unless the authorization is terminated or revoked sooner. Performed at [facility], [address], [place], [place] [address]      Labs: BNP (last 3 results) Recent Labs    01/24/18 2035  BNP 82.7   Basic Metabolic Panel: Recent Labs  Lab 11/10/18 1518 11/11/18 0741  NA 138 138  K 4.0 4.3  CL 107 108  CO2 23 20*  GLUCOSE 138* 149*  BUN 21 18  CREATININE 1.21* 1.22*  CALCIUM 8.8* 8.9  MG  --  2.1   Liver Function Tests: Recent Labs  Lab 11/10/18 1518  AST 9*  ALT 9  ALKPHOS 95  BILITOT 0.1*  PROT 8.0  ALBUMIN 3.4*   No results for input(s): LIPASE, AMYLASE in the last 168 hours. No results for input(s): AMMONIA in the last 168 hours. CBC: Recent Labs  Lab 11/10/18 1518 11/11/18 0741  WBC 10.6* 11.4*  HGB 8.1* 7.9*  HCT 29.7* 27.4*  MCV 76.7* 74.5*  PLT 624* 512*   Cardiac Enzymes: No results for input(s): CKTOTAL, CKMB, CKMBINDEX, TROPONINI in the last 168 hours. BNP: Invalid input(s): POCBNP CBG: Recent Labs  Lab 11/11/18 1651 11/11/18 2042 11/12/18 0013 11/12/18 0522 11/12/18 0737  GLUCAP 163* 165* 172* 165* 170*   D-Dimer No results for input(s): DDIMER in the last 72 hours. Hgb A1c No results for input(s): HGBA1C in the last 72 hours. Lipid Profile Recent Labs    11/11/18 0741  CHOL 113  HDL 47   LDLCALC 46  TRIG 102  CHOLHDL 2.4   Thyroid function studies No results for input(s): TSH, T4TOTAL, T3FREE, THYROIDAB in the last 72 hours.  Invalid input(s): FREET3 Anemia work up No results for input(s): VITAMINB12, FOLATE, FERRITIN, TIBC, IRON, RETICCTPCT in the last 72 hours. Urinalysis    Component Value Date/Time   COLORURINE YELLOW 11/10/2018 1920   APPEARANCEUR CLOUDY (A) 11/10/2018 1920   LABSPEC 1.015 11/10/2018 1920   PHURINE 5.0 11/10/2018 1920   GLUCOSEU NEGATIVE 11/10/2018 1920   HGBUR SMALL (A) 11/10/2018 1920   HGBUR negative 01/06/2010 0929   BILIRUBINUR NEGATIVE 11/10/2018 1920   BILIRUBINUR moderate 11/17/2016 1438   KETONESUR NEGATIVE 11/10/2018 1920   PROTEINUR 30 (A) 11/10/2018 1920   UROBILINOGEN >=8.0 (A) 11/17/2016 1438   UROBILINOGEN 0.2 10/12/2011 1556   NITRITE NEGATIVE 11/10/2018 1920   LEUKOCYTESUR LARGE (A) 11/10/2018 1920   Sepsis Labs Invalid input(s): PROCALCITONIN,  WBC,  [place] Microbiology Recent Results (from the past 240 hour(s))  SARS Coronavirus 2 [facility] order, Performed in [facility] hospital lab) Nasopharyngeal Nasopharyngeal Swab     Status: None   Collection Time: 11/10/18 10:33 PM   Specimen: Nasopharyngeal Swab  Result Value Ref Range Status   SARS Coronavirus 2 NEGATIVE NEGATIVE Final    Comment: (NOTE) If result is NEGATIVE SARS-CoV-2 target nucleic acids are NOT DETECTED. The SARS-CoV-2 RNA is generally detectable in upper and lower  respiratory specimens during the acute phase of infection. The lowest  concentration of SARS-CoV-2 viral copies this assay can detect is 250  copies / mL. A negative result does not preclude SARS-CoV-2 infection  and should not be used as the sole basis for treatment or other  patient management decisions.  A negative result may occur with  improper specimen collection / handling, submission of specimen other  than nasopharyngeal swab, presence of viral mutation(s) within the   areas targeted by this assay, and inadequate number of viral copies  (<250 copies / mL). A negative result must be combined with clinical  observations, patient history, and epidemiological information. If result is POSITIVE SARS-CoV-2 target nucleic acids are DETECTED. The SARS-CoV-2 RNA is generally detectable in upper and lower  respiratory specimens dur ing the acute phase of infection.  Positive  results are indicative of active infection with SARS-CoV-2.  Clinical  correlation with patient history and other diagnostic information is  necessary to determine patient infection status.  Positive results do  not rule out bacterial infection or co-infection with other viruses. If result is PRESUMPTIVE POSTIVE SARS-CoV-2 nucleic acids MAY BE PRESENT.   A presumptive positive result was obtained on the submitted specimen  and confirmed on repeat testing.  While 2019 novel coronavirus  (SARS-CoV-2) nucleic acids may be present in the submitted sample  additional confirmatory testing may be necessary for epidemiological  and / or clinical management purposes  to differentiate between  SARS-CoV-2 and other Sarbecovirus currently known to infect humans.  If clinically indicated additional testing with an alternate test  methodology [phone]) is advised. The SARS-CoV-2 RNA is generally  detectable in upper and lower respiratory sp ecimens during the acute  phase of infection. The expected result is Negative. Fact Sheet for Patients:  StrictlyIdeas.no Fact Sheet for Healthcare Providers: BankingDealers.co.za This test is not yet approved or cleared by the [place] FDA and has been authorized for detection and/or diagnosis of SARS-CoV-2 by FDA under an Emergency Use Authorization (EUA).  This EUA will remain in effect (meaning this test can be used) for the duration of the COVID-19 declaration under Section 564(b)(1) of the Act, 21  U.S.C. section 360bbb-3(b)(1), unless the authorization is terminated or revoked sooner. Performed at [facility], [address], [place], [place] [address]      Time coordinating discharge: 35 minutes      SIGNED:   Edwin Dada, MD  [facility] 11/12/2018, 7:30 PM

## 2018-11-12 NOTE — Care Management Obs Status (Signed)
[place] NOTIFICATION   Patient Details  Name: [name] MRN: [id number] Date of Birth: 1940-10-03   Medicare Observation Status Notification Given:  Yes    Carles Collet, RN 11/12/2018, 8:34 AM

## 2018-11-12 NOTE — Plan of Care (Signed)

## 2018-11-12 NOTE — TOC Transition Note (Signed)
Transition of Care [facility]) - CM/SW Discharge Note   Patient Details  Name: [name] MRN: [id number] Date of Birth: 1940/09/02  Transition of Care [facility]) CM/SW Contact:  Bartholomew Crews, RN Phone Number: [phone] 11/12/2018, 11:46 AM   Clinical Narrative:    Patient to transition home today. Already set up with [facility]. Notified Cory at [place] of transition home today. No further TOC needs identified.    Final next level of care: [place] Barriers to Discharge: No Barriers Identified   Patient Goals and CMS Choice Patient states their goals for this hospitalization and ongoing recovery are:: to go home CMS Medicare.gov Compare Post Acute Care list provided to:: Patient Choice offered to / list presented to : Patient  Discharge Placement                       Discharge Plan and Services                          HH Arranged: PT, OT [facility] Agency: [place] Date [place]: 11/12/18 Time [place]: [id number] Representative spoke with at [place]: [place] ([place]) Interventions     Readmission Risk Interventions No flowsheet data found.

## 2018-11-12 NOTE — TOC Transition Note (Signed)
Transition of Care [facility]) - CM/SW Discharge Note   Patient Details  Name: [name] MRN: [id number] Date of Birth: 1941-01-25  Transition of Care [facility]) CM/SW Contact:  Carles Collet, RN Phone Number: 11/12/2018, 8:38 AM   Clinical Narrative:    Spoke to patient, she would like to have home health services as recommended by PT. We discussed providers and [facility] ratings and she would like [place]. Tommi Rumps accepted referral. No other CM needs.    Final next level of care: [place] Barriers to Discharge: No Barriers Identified   Patient Goals and CMS Choice Patient states their goals for this hospitalization and ongoing recovery are:: to go home CMS Medicare.gov Compare Post Acute Care list provided to:: Patient Choice offered to / list presented to : Patient  Discharge Placement                       Discharge Plan and Services                          HH Arranged: PT [facility] Agency: [place] Date [facility] Agency Contacted: 11/12/18 Time [place]: [phone] Representative spoke with at [place]: [place] ([place]) Interventions     Readmission Risk Interventions No flowsheet data found.

## 2018-11-13 ENCOUNTER — Telehealth: Payer: Self-pay

## 2018-11-13 LAB — URINE CULTURE: Culture: <10000 — AB

## 2018-11-13 NOTE — Telephone Encounter (Signed)
Transition Care Management Follow-up Telephone Call   Date discharged?  11/12/2018              How have you been since you were released from the hospital? better   Do you understand why you were in the hospital? Another UTI   Do you understand the discharge instructions? Yes, her sister helps her   Where were you discharged to? home   Items Reviewed:  Medications reviewed: yes  Allergies reviewed: yes  Dietary changes reviewed: yes  Referrals reviewed: yes   Functional Questionnaire:   Activities of Daily Living (ADLs):  has help if she needs it    Any transportation issues/concerns?: no   Any patient concerns? no   Confirmed importance and date/time of follow-up visits scheduled 11/16/2018 at 3:40 telephone visit with [place] NP     Confirmed with patient if condition begins to worsen call PCP or go to the ER.  Patient was given the office number and encouraged to call back with question or concerns.  yes with verbal understanding

## 2018-11-14 ENCOUNTER — Telehealth: Payer: Self-pay | Admitting: *Deleted

## 2018-11-14 NOTE — Telephone Encounter (Signed)
[name] with [facility] called wanted to let Dr Moshe Cipro know that they had a home health referral to see pt after a hospital visit for chest pain and UTI. They had orders for PT and OT but pt was refusing therapy. Daughter told them to wait until next week to come as she was going to try to convince pt to do the therapy. Just wanted to let Dr Moshe Cipro know.

## 2018-11-15 NOTE — Telephone Encounter (Signed)
noted 

## 2018-11-16 ENCOUNTER — Ambulatory Visit (INDEPENDENT_AMBULATORY_CARE_PROVIDER_SITE_OTHER): Payer: Medicare HMO | Admitting: Family Medicine

## 2018-11-16 ENCOUNTER — Other Ambulatory Visit: Payer: Self-pay

## 2018-11-16 ENCOUNTER — Encounter: Payer: Self-pay | Admitting: Family Medicine

## 2018-11-16 DIAGNOSIS — R413 Other amnesia: Secondary | ICD-10-CM | POA: Diagnosis not present

## 2018-11-16 DIAGNOSIS — N39498 Other specified urinary incontinence: Secondary | ICD-10-CM | POA: Diagnosis not present

## 2018-11-16 DIAGNOSIS — R1319 Other dysphagia: Secondary | ICD-10-CM

## 2018-11-16 DIAGNOSIS — R1310 Dysphagia, unspecified: Secondary | ICD-10-CM | POA: Diagnosis not present

## 2018-11-16 DIAGNOSIS — Z7689 Persons encountering health services in other specified circumstances: Secondary | ICD-10-CM

## 2018-11-16 NOTE — Patient Instructions (Signed)
    I appreciate the opportunity to provide you with the care for your health and wellness.  Today we discussed: recent hospital stay  Follow up: 12/04/2018  No labs or referrals today       Please continue to practice social distancing to keep you, your family, and our community safe.  If you must go out, please wear a mask and practice good handwashing.  GET FRESH AIR DAILY. STAY HYDRATED WITH WATER.   It was a pleasure to see you and I look forward to continuing to work together on your health and well-being. Please do not hesitate to call the office if you need care or have questions about your care.  Have a wonderful day and week. With Gratitude, Cherly Beach, DNP, AGNP-BC

## 2018-11-16 NOTE — Progress Notes (Signed)
Virtual Visit via Telephone Note   This visit type was conducted due to national recommendations for restrictions regarding the COVID-19 Pandemic (e.g. social distancing) in an effort to limit this patient's exposure and mitigate transmission in our community.  Due to her co-morbid illnesses, this patient is at least at moderate risk for complications without adequate follow up.  This format is felt to be most appropriate for this patient at this time.  The patient did not have access to video technology/had technical difficulties with video requiring transitioning to audio format only (telephone).  All issues noted in this document were discussed and addressed.  No physical exam could be performed with this format.    Evaluation Performed:  Follow-up visit  Date:  11/16/2018   ID:  [name], DOB November 27, 1940, MRN [id number]  Patient Location: Home Provider Location: Office  Location of Patient: Home Location of Provider: Telehealth Consent was obtain for visit to be over via telehealth. I verified that I am speaking with the correct person using two identifiers.  PCP:  [name], MD   Chief Complaint:  TOC   History of Present Illness:    [name] is a 78 y.o. female with history of diabetes, diastolic congestive heart failure, PE, mild dementia who presented to the emergency room back on September 25 after having 4 weeks of intermittent nonexertional left-sided chest pain along with a cough.  New weakness and malaise and dysuria.  In the emergency room she was negative for pneumonia, troponins were negative, UA was suggestive of UTI.  Which she was treated for for couple days and then released.  She was discharged on a course of cephalexin which she reports that she is completed.  Skin is healthy without breakdown. Incontinence of urine and stool. Denies falls. Denies changes in baseline mentation. Denies eating and drinking problems, but reports having some chest  pain and cough. Might be from esophageal dysphagia. She is on omeprazole daily for this, but is constantly reporting trouble with heart burn and chest discomfort. Has not seen GI since 2018 from chart review.  The patient does not have symptoms concerning for COVID-19 infection (fever, chills, cough, or new shortness of breath).   Past Medical, Surgical, Social History, Allergies, and Medications have been Reviewed. Past Medical History:  Diagnosis Date  . Anemia, iron deficiency 2012   Evaluated by Dr. Oneida Alar; H&H of 9.3/30.8 with [place] in 10/2010; 3/3 positive Hemoccult cards in 07/2011  . Anxiety    was on Prozac for a couple of weeks  . Atrial fibrillation ([place])    takes Coumadin daily  . Bruises easily    takes Coumadin  . Chronic anticoagulation 07/21/2010  . Chronic back pain    related to knee pain  . Degenerative joint disease    Knees  . Dementia ([place])   . Depression   . Diabetes mellitus    takes Metformin daily  . Gastroesophageal reflux disease    takes Omeprazole daily  . Glaucoma   . Glaucoma    uses eye drops at night  . History of blood transfusion 1969  . History of gout    was on medication but taken off of several months ago  . Hx of colonic polyps   . Hx of migraines    last one about a yr ago;takes Topamax daily  . Hyperlipidemia    takes Pravastatin daily  . Hypertension    takes Hyzaar daily and Propranolol   .  Insomnia    takes Restoril prn  . Joint pain   . Joint swelling   . Memory loss    takes donepezil  . Migraine   . Migraine   . Nocturia   . Overweight(278.02)   . Pneumonia 1969   hx of  . Pulmonary embolism [facility]) May 2012   acute presentation, bilateral PE  . Skin spots-aging   . Urinary frequency    Past Surgical History:  Procedure Laterality Date  . CATARACT EXTRACTION W/PHACO Right 04/11/2012   Procedure: CATARACT EXTRACTION PHACO AND INTRAOCULAR LENS PLACEMENT (IOC);  Surgeon: Elta Guadeloupe T. Gershon Crane, MD;  Location: [facility];   Service: Ophthalmology;  Laterality: Right;  CDE=9.61  . CATARACT EXTRACTION W/PHACO Left 12/26/2012   Procedure: CATARACT EXTRACTION PHACO AND INTRAOCULAR LENS PLACEMENT (IOC);  Surgeon: Elta Guadeloupe T. Gershon Crane, MD;  Location: [facility];  Service: Ophthalmology;  Laterality: Left;  CDE:7.74  . COLONOSCOPY  Jan 2002; 2012   2002: Dr. Tamala Julian, ext. hemorrhoids, nl colon; 2012-adenomatous polyps, gastritis on EGD  . CYSTOSCOPY W/ URETERAL STENT PLACEMENT Bilateral 02/25/2018   Procedure: CYSTOSCOPY WITH BILATERAL RETROGRADE PYELOGRAM/BILATERAL URETERAL STENT PLACEMENT, BLADDER BIOPSIES WITH FULGERATION;  Surgeon: [name], MD;  Location: [facility];  Service: Urology;  Laterality: Bilateral;  . CYSTOSCOPY/URETEROSCOPY/HOLMIUM LASER/STENT PLACEMENT Bilateral 03/21/2018   Procedure: CYSTOSCOPY/URETEROSCOPY/HOLMIUM LASER/STENT PLACEMENT;  Surgeon: [name], MD;  Location: [facility];  Service: Urology;  Laterality: Bilateral;  ONLY NEEDS 60 MIN  . DILATION AND CURETTAGE OF UTERUS    . ESOPHAGOGASTRODUODENOSCOPY  08/24/2011   ESOPHAGOGASTRODUODENOSCOPY; esophageal dilatation; Rogene Houston, MD;  . Freda Munro CAPSULE STUDY  08/18/2011   Procedure: GIVENS CAPSULE STUDY;  Surgeon: Rogene Houston, MD;  Location: [facility];  Service: Endoscopy;  Laterality: N/A;  730  . KNEE ARTHROSCOPY W/ MENISCECTOMY  90's   Left  . ORIF ANKLE FRACTURE Right 90's  . TONSILLECTOMY    . TOTAL KNEE ARTHROPLASTY  10/20/2011   Procedure: TOTAL KNEE ARTHROPLASTY;  Surgeon: Ninetta Lights, MD;  Location: [facility];  Service: Orthopedics;  Laterality: Left;  . UPPER GASTROINTESTINAL ENDOSCOPY    . URETHRAL DILATION       Current Meds  Medication Sig  . ACCU-CHEK AVIVA PLUS test strip USE TO TEST BLOOD SUAGR 2 TIMES A DAY  . acetaminophen (TYLENOL) 500 MG tablet Take one tablet two times daily for chronic pain  . albuterol (VENTOLIN HFA) 108 (90 Base) MCG/ACT inhaler Inhale 2 puffs into the lungs every 6 (six)  hours as needed for wheezing or shortness of breath.  Marland Kitchen amLODipine (NORVASC) 10 MG tablet Take 1 tablet (10 mg total) by mouth daily.  Marland Kitchen atorvastatin (LIPITOR) 40 MG tablet TAKE 1 TABLET BY MOUTH EVERY DAY  . Blood Glucose Monitoring Suppl (ACCU-CHEK AVIVA PLUS) w/Device KIT For once daily testing dx E11.9  . busPIRone (BUSPAR) 7.5 MG tablet TAKE 1 TABLET BY MOUTH 3 TIMES DAILY (Patient taking differently: Take 7.5 mg by mouth 3 (three) times daily. )  . cloNIDine (CATAPRES) 0.2 MG tablet Take 1 tablet (0.2 mg total) by mouth at bedtime.  . clotrimazole-betamethasone (LOTRISONE) cream Apply 1 application topically 2 (two) times daily. D/c vusion ointment  . colchicine 0.6 MG tablet Take 1 tablet (0.6 mg total) by mouth daily. May decrease to once per day as needed for diarrhea  . COMBIGAN 0.2-0.5 % ophthalmic solution Place 1 drop into both eyes 2 (two) times daily.  Marland Kitchen conjugated estrogens (PREMARIN) vaginal cream Place vaginally 2 (  two) times a week. Apply sparingly ( fingertip)  Twice weekly to vaginal area on Wednesdays and Fridays  . dabigatran (PRADAXA) 150 MG CAPS capsule Take 1 capsule (150 mg total) by mouth 2 (two) times daily.  Marland Kitchen docusate sodium (COLACE) 100 MG capsule Take 1 capsule (100 mg total) by mouth daily as needed for mild constipation.  Marland Kitchen donepezil (ARICEPT) 10 MG tablet Take 1 tablet (10 mg total) by mouth at bedtime.  . Fluoxetine HCl, PMDD, 10 MG TABS Take 1 tablet (10 mg total) by mouth daily.  . hydrOXYzine (VISTARIL) 25 MG capsule TAKE 1 CAPSULE (25 MG TOTAL) BY MOUTH 3 (THREE) TIMES DAILY.  Marland Kitchen imipramine (TOFRANIL) 50 MG tablet TAKE 2 TABLETS (100 MG TOTAL) BY MOUTH AT BEDTIME.  . insulin NPH-regular Human (NOVOLIN 70/30) (70-30) 100 UNIT/ML injection Inject 40 Units into the skin 2 (two) times daily with a meal.  . Insulin Syringe-Needle U-100 (BD VEO INSULIN SYRINGE U/F) 31G X 15/64" 0.5 ML MISC Use to inject Novolin twice daily  . meclizine (ANTIVERT) 25 MG tablet TAKE  1 TABLET THREE TIMES DAILY AS NEEDED FOR DIZZINESS.  . montelukast (SINGULAIR) 10 MG tablet TAKE 1 TABLET BY MOUTH EVERYDAY AT BEDTIME  . nystatin (MYCOSTATIN/NYSTOP) powder Apply topically daily as needed (for rash under breasts).  Marland Kitchen olopatadine (PATANOL) 0.1 % ophthalmic solution Place 1 drop into both eyes 2 (two) times daily.  Marland Kitchen omeprazole (PRILOSEC) 40 MG capsule TAKE 1 CAPSULE BY MOUTH TWICE A DAY  . [START ON 11/20/2018] oxyCODONE-acetaminophen (PERCOCET) 10-325 MG tablet Take 1 tablet 2 times daily by mouth for chronic pain.  . polyethylene glycol (MIRALAX / GLYCOLAX) packet Take 17 g by mouth daily.  Marland Kitchen senna-docusate (SENOKOT-S) 8.6-50 MG tablet Take 2 tablets by mouth at bedtime. (Patient taking differently: Take 2 tablets by mouth 2 (two) times daily. )  . SUMAtriptan (IMITREX) 5 MG/ACT nasal spray Place 1 spray into the nose every 2 (two) hours as needed for migraine.   . temazepam (RESTORIL) 30 MG capsule Take 1 capsule (30 mg total) by mouth at bedtime.  . topiramate (TOPAMAX) 100 MG tablet TAKE 1 TABLET BY MOUTH TWICE A DAY  . Vitamin D, Ergocalciferol, (DRISDOL) 50000 units CAPS capsule TAKE 1 CAPSULE (50,000 UNITS TOTAL) BY MOUTH ONCE A WEEK. (Patient taking differently: Take 50,000 Units by mouth every 7 (seven) days. Saturday)     Allergies:   Penicillins, Fentanyl, Sulfonamide derivatives, and Codeine   Social History   Tobacco Use  . Smoking status: Never Smoker  . Smokeless tobacco: Never Used  Substance Use Topics  . Alcohol use: No  . Drug use: No     Family Hx: The patient's family history includes Arthritis in her mother; Diabetes in her brother, brother, brother, brother, mother, and sister; Heart failure in her mother; Hypertension in her brother, brother, brother, brother, mother, and sister; Kidney failure in her brother, father, and mother; Leukemia in her father; Migraines in her father, mother, and sister; Pulmonary embolism in her sister. There is no  history of Colon cancer or Colon polyps.  ROS:   Please see the history of present illness.    All other systems reviewed and are negative.   Labs/Other Tests and Data Reviewed:    Recent Labs: 01/24/2018: B Natriuretic Peptide 63.0 11/10/2018: ALT 9 11/11/2018: BUN 18; Creatinine, Ser 1.22; Hemoglobin 7.9; Magnesium 2.1; Platelets 512; Potassium 4.3; Sodium 138   Recent Lipid Panel Lab Results  Component Value Date/Time   CHOL  113 11/11/2018 07:41 AM   TRIG 102 11/11/2018 07:41 AM   HDL 47 11/11/2018 07:41 AM   CHOLHDL 2.4 11/11/2018 07:41 AM   LDLCALC 46 11/11/2018 07:41 AM   LDLCALC 47 07/03/2018 03:56 PM    Wt Readings from Last 3 Encounters:  11/12/18 224 lb 9.6 oz (101.9 kg)  09/27/18 233 lb (105.7 kg)  08/09/18 230 lb (104.3 kg)     Objective:    Vital Signs:  There were no vitals taken for this visit.   GEN:  alert and oriented  RESPIRATORY:  no shortness of breath in conversation  PSYCH:  normal mood and affect  ASSESSMENT & PLAN:     1. Encounter for support and coordination of transition of care As documented above. UTI appears resolved. Completion of antibiotics. Denies S&S of UTI.  2. Other urinary incontinence Unchanged-this was a baseline before hospital admission.   3.. Esophageal dysphagia Reports trouble with this. Might need medication adjustment. Advised to call GI office as she is a pt, if referral is needed I will send one.   4.. Memory loss Stable at baseline per sister    Time:   Today, I have spent 10 minutes with the patient with telehealth technology discussing the above problems.     Medication Adjustments/Labs and Tests Ordered: Current medicines are reviewed at length with the patient today.  Concerns regarding medicines are outlined above.   Tests Ordered: No orders of the defined types were placed in this encounter.   Medication Changes: No orders of the defined types were placed in this encounter.    Disposition:  Follow up 12/04/2018  Signed, Perlie Mayo, NP  11/16/2018 3:33 PM     [facility]

## 2018-11-20 ENCOUNTER — Other Ambulatory Visit: Payer: Self-pay | Admitting: Family Medicine

## 2018-11-20 ENCOUNTER — Telehealth: Payer: Self-pay | Admitting: *Deleted

## 2018-11-20 NOTE — Telephone Encounter (Signed)
[name] PT with [facility] called. [name] was d/c from hospital last week and was ordered for PT and OT however she refused last week. They tried again as patients sister was going to try to get her to do it but the patient is still refusing services at this time. Just wanted to make Dr. Moshe Cipro aware.

## 2018-11-20 NOTE — Telephone Encounter (Signed)
Noted, thanks!

## 2018-12-04 ENCOUNTER — Ambulatory Visit (INDEPENDENT_AMBULATORY_CARE_PROVIDER_SITE_OTHER): Payer: Medicare HMO | Admitting: Family Medicine

## 2018-12-04 ENCOUNTER — Encounter: Payer: Self-pay | Admitting: Family Medicine

## 2018-12-04 ENCOUNTER — Other Ambulatory Visit: Payer: Self-pay

## 2018-12-04 VITALS — BP 162/76 | HR 101 | Temp 97.6°F | Resp 15 | Ht 67.0 in | Wt 239.0 lb

## 2018-12-04 DIAGNOSIS — E119 Type 2 diabetes mellitus without complications: Secondary | ICD-10-CM | POA: Diagnosis not present

## 2018-12-04 DIAGNOSIS — J42 Unspecified chronic bronchitis: Secondary | ICD-10-CM | POA: Diagnosis not present

## 2018-12-04 DIAGNOSIS — G8929 Other chronic pain: Secondary | ICD-10-CM

## 2018-12-04 DIAGNOSIS — E785 Hyperlipidemia, unspecified: Secondary | ICD-10-CM | POA: Diagnosis not present

## 2018-12-04 DIAGNOSIS — I10 Essential (primary) hypertension: Secondary | ICD-10-CM

## 2018-12-04 DIAGNOSIS — G43109 Migraine with aura, not intractable, without status migrainosus: Secondary | ICD-10-CM

## 2018-12-04 DIAGNOSIS — E559 Vitamin D deficiency, unspecified: Secondary | ICD-10-CM

## 2018-12-04 DIAGNOSIS — E8881 Metabolic syndrome: Secondary | ICD-10-CM | POA: Diagnosis not present

## 2018-12-04 DIAGNOSIS — J410 Simple chronic bronchitis: Secondary | ICD-10-CM

## 2018-12-04 DIAGNOSIS — B3731 Acute candidiasis of vulva and vagina: Secondary | ICD-10-CM

## 2018-12-04 DIAGNOSIS — E790 Hyperuricemia without signs of inflammatory arthritis and tophaceous disease: Secondary | ICD-10-CM | POA: Diagnosis not present

## 2018-12-04 DIAGNOSIS — E1169 Type 2 diabetes mellitus with other specified complication: Secondary | ICD-10-CM | POA: Diagnosis not present

## 2018-12-04 DIAGNOSIS — D509 Iron deficiency anemia, unspecified: Secondary | ICD-10-CM

## 2018-12-04 DIAGNOSIS — F418 Other specified anxiety disorders: Secondary | ICD-10-CM

## 2018-12-04 DIAGNOSIS — Z23 Encounter for immunization: Secondary | ICD-10-CM

## 2018-12-04 DIAGNOSIS — E1159 Type 2 diabetes mellitus with other circulatory complications: Secondary | ICD-10-CM

## 2018-12-04 DIAGNOSIS — B373 Candidiasis of vulva and vagina: Secondary | ICD-10-CM

## 2018-12-04 MED ORDER — SUMATRIPTAN SUCCINATE 25 MG PO TABS: 25.0000 mg | ORAL_TABLET | ORAL | 0 refills | Status: DC | PRN

## 2018-12-04 MED ORDER — NYSTATIN 100000 UNIT/GM EX POWD: Freq: Every day | CUTANEOUS | 3 refills | Status: DC | PRN

## 2018-12-04 MED ORDER — TEMAZEPAM 30 MG PO CAPS: 30.0000 mg | ORAL_CAPSULE | Freq: Every evening | ORAL | 5 refills | Status: DC | PRN

## 2018-12-04 MED ORDER — OXYCODONE-ACETAMINOPHEN 10-325 MG PO TABS: ORAL_TABLET | ORAL | 0 refills | Status: DC

## 2018-12-04 MED ORDER — FLUCONAZOLE 100 MG PO TABS: 100.0000 mg | ORAL_TABLET | Freq: Every day | ORAL | 0 refills | Status: AC

## 2018-12-04 NOTE — Patient Instructions (Addendum)
Annual physical exam with mD last week in January, also pain management, call if you need me sooner  Flu vaccine today  Labs today, lipid, cmp and eGFR, hBA1C CCUA and microalb ( in lab) TSH and vit D and uric acid level  New is  2 fluconazole tablet  New is immitrex for headache , maximum use is twice weekly  You will be referred for in home testing for low oxygen when sleeping to see if you need supplemental oxygen at that time   I will refer you to dr Laural Golden for new choking and difficulty swallowing  Careful not to fall, but keep as active as possible

## 2018-12-05 ENCOUNTER — Ambulatory Visit: Payer: Medicare HMO | Admitting: Student

## 2018-12-05 LAB — URINALYSIS
Bilirubin Urine: NEGATIVE
Glucose, UA: NEGATIVE
Nitrite: POSITIVE — AB
Specific Gravity, Urine: 1.023 (ref 1.001–1.03)
pH: 6 (ref 5.0–8.0)

## 2018-12-05 LAB — LIPID PANEL
Cholesterol: 135 mg/dL (ref <200)
HDL: 55 mg/dL (ref >=50)
LDL Cholesterol (Calc): 55 mg/dL (calc)
Non-HDL Cholesterol (Calc): 80 mg/dL (calc) (ref <130)
Total CHOL/HDL Ratio: 2.5 (calc) (ref <5.0)
Triglycerides: 178 mg/dL — ABNORMAL HIGH (ref <150)

## 2018-12-05 LAB — MICROALBUMIN / CREATININE URINE RATIO
Creatinine, Urine: 129 mg/dL (ref 20–275)
Microalb Creat Ratio: 300 mcg/mg creat — ABNORMAL HIGH (ref <30)
Microalb, Ur: 38.7 mg/dL

## 2018-12-05 LAB — COMPLETE METABOLIC PANEL WITH GFR
AG Ratio: 1.1 (calc) (ref 1.0–2.5)
ALT: 5 U/L — ABNORMAL LOW (ref 6–29)
AST: 8 U/L — ABNORMAL LOW (ref 10–35)
Albumin: 3.6 g/dL (ref 3.6–5.1)
Alkaline phosphatase (APISO): 110 U/L (ref 37–153)
BUN/Creatinine Ratio: 15 (calc) (ref 6–22)
BUN: 17 mg/dL (ref 7–25)
CO2: 22 mmol/L (ref 20–32)
Calcium: 8.7 mg/dL (ref 8.6–10.4)
Chloride: 105 mmol/L (ref 98–110)
Creat: 1.12 mg/dL — ABNORMAL HIGH (ref 0.60–0.93)
GFR, Est African American: 54 mL/min/{1.73_m2} — ABNORMAL LOW (ref >=60)
GFR, Est Non African American: 47 mL/min/{1.73_m2} — ABNORMAL LOW (ref >=60)
Globulin: 3.3 g/dL (calc) (ref 1.9–3.7)
Glucose, Bld: 255 mg/dL — ABNORMAL HIGH (ref 65–139)
Potassium: 4.7 mmol/L (ref 3.5–5.3)
Sodium: 137 mmol/L (ref 135–146)
Total Bilirubin: 0.2 mg/dL (ref 0.2–1.2)
Total Protein: 6.9 g/dL (ref 6.1–8.1)

## 2018-12-05 LAB — VITAMIN D 25 HYDROXY (VIT D DEFICIENCY, FRACTURES): Vit D, 25-Hydroxy: 25 ng/mL — ABNORMAL LOW (ref 30–100)

## 2018-12-05 LAB — URIC ACID: Uric Acid, Serum: 4.7 mg/dL (ref 2.5–7.0)

## 2018-12-05 LAB — TSH: TSH: 1.98 mIU/L (ref 0.40–4.50)

## 2018-12-05 LAB — HEMOGLOBIN A1C
Hgb A1c MFr Bld: 7.5 % of total Hgb — ABNORMAL HIGH (ref <5.7)
Mean Plasma Glucose: 169 (calc)
eAG (mmol/L): 9.3 (calc)

## 2018-12-06 ENCOUNTER — Telehealth (INDEPENDENT_AMBULATORY_CARE_PROVIDER_SITE_OTHER): Payer: Medicare HMO | Admitting: Student

## 2018-12-06 ENCOUNTER — Encounter: Payer: Self-pay | Admitting: Student

## 2018-12-06 ENCOUNTER — Other Ambulatory Visit: Payer: Self-pay

## 2018-12-06 VITALS — Ht 66.0 in | Wt 239.0 lb

## 2018-12-06 DIAGNOSIS — R072 Precordial pain: Secondary | ICD-10-CM

## 2018-12-06 DIAGNOSIS — E785 Hyperlipidemia, unspecified: Secondary | ICD-10-CM

## 2018-12-06 DIAGNOSIS — I10 Essential (primary) hypertension: Secondary | ICD-10-CM

## 2018-12-06 DIAGNOSIS — Z86711 Personal history of pulmonary embolism: Secondary | ICD-10-CM

## 2018-12-06 NOTE — Patient Instructions (Signed)
Medication Instructions:  Your physician recommends that you continue on your current medications as directed. Please refer to the Current Medication list given to you today.  *If you need a refill on your cardiac medications before your next appointment, please call your pharmacy*  Lab Work: NONE  If you have labs (blood work) drawn today and your tests are completely normal, you will receive your results only by: Marland Kitchen MyChart Message (if you have MyChart) OR . A paper copy in the mail If you have any lab test that is abnormal or we need to change your treatment, we will call you to review the results.  Testing/Procedures: NONE   Follow-Up: At [facility], you and your health needs are our priority.  As part of our continuing mission to provide you with exceptional heart care, we have created designated Provider Care Teams.  These Care Teams include your primary Cardiologist (physician) and Advanced Practice Providers (APPs -  Physician Assistants and Nurse Practitioners) who all work together to provide you with the care you need, when you need it.  Your next appointment:   12 months  The format for your next appointment:   In Person  Provider:   Carlyle Dolly, MD  Other Instructions Thank you for choosing [facility]!

## 2018-12-06 NOTE — Progress Notes (Signed)
Virtual Visit via Telephone Note   This visit type was conducted due to national recommendations for restrictions regarding the COVID-19 Pandemic (e.g. social distancing) in an effort to limit this patient's exposure and mitigate transmission in our community.  Due to her co-morbid illnesses, this patient is at least at moderate risk for complications without adequate follow up.  This format is felt to be most appropriate for this patient at this time.  The patient did not have access to video technology/had technical difficulties with video requiring transitioning to audio format only (telephone).  All issues noted in this document were discussed and addressed.  No physical exam could be performed with this format.  Please refer to the patient's chart for her  consent to telehealth for [facility].   Date:  12/06/2018   ID:  [name], DOB 05/24/1940, MRN [id number]  Patient Location: Home Provider Location: Office  PCP:  [name], MD  Cardiologist:  Carlyle Dolly, MD  Electrophysiologist:  None   Evaluation Performed:  Follow-Up Visit  Chief Complaint: Hospital Follow-Up  History of Present Illness:    [name] is a 78 y.o. 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   She most recently presented to [facility] ED on 11/10/2018 for evaluation of chest pain, nausea and fatigue. She reported episodes of chest discomfort which could occur at rest or with activity and would last for hours at a time. Troponin values were negative at 7 and 8 with EKG showing no acute ischemic changes. Repeat CTA was negative for PE. Her chest pain was overall felt to be atypical for a cardiac etiology as her chest was tender to palpation and was worse with deep breathing and positional changes. An echocardiogram was obtained which showed a preserved EF of 60 to 65% with no regional wall motion  abnormalities. She did have mild aortic valve sclerosis without stenosis. She was treated for a UTI during admission with Ceftriaxone and was discharged on Keflex.  In talking with the patient and her sister today, she reports overall doing well from a cardiac perspective since her most recent hospitalization.  She denies any repeat episodes of chest pain. She does have baseline dyspnea on exertion which has been unchanged per her report.  Her sister reports that she is not active at baseline and uses a walker throughout the house. She does not leave the house except for medical visits.  She denies any specific orthopnea, PND or lower extremity edema. No recent palpitations, dizziness or presyncope. She does continue to have episodes of difficulty swallowing and has been referred back to GI by her PCP.   The patient does not have symptoms concerning for COVID-19 infection (fever, chills, cough, or new shortness of breath).    Past Medical History:  Diagnosis Date  . Anemia, iron deficiency 2012   Evaluated by Dr. Oneida Alar; H&H of 9.3/30.8 with Milo in 10/2010; 3/3 positive Hemoccult cards in 07/2011  . Anxiety    was on Prozac for a couple of weeks  . Atrial fibrillation ([place])    takes Coumadin daily  . Bruises easily    takes Coumadin  . Chronic anticoagulation 07/21/2010  . Chronic back pain    related to knee pain  . Degenerative joint disease    Knees  . Dementia ([place])   . Depression   . Diabetes mellitus    takes  Metformin daily  . Gastroesophageal reflux disease    takes Omeprazole daily  . Glaucoma   . Glaucoma    uses eye drops at night  . History of blood transfusion 1969  . History of gout    was on medication but taken off of several months ago  . Hx of colonic polyps   . Hx of migraines    last one about a yr ago;takes Topamax daily  . Hyperlipidemia    takes Pravastatin daily  . Hypertension    takes Hyzaar daily and Propranolol   . Insomnia    takes Restoril prn   . Joint pain   . Joint swelling   . Memory loss    takes donepezil  . Migraine   . Migraine   . Nocturia   . Overweight(278.02)   . Pneumonia 1969   hx of  . Pulmonary embolism [facility]) May 2012   acute presentation, bilateral PE  . Skin spots-aging   . Urinary frequency    Past Surgical History:  Procedure Laterality Date  . CATARACT EXTRACTION W/PHACO Right 04/11/2012   Procedure: CATARACT EXTRACTION PHACO AND INTRAOCULAR LENS PLACEMENT (IOC);  Surgeon: Elta Guadeloupe T. Gershon Crane, MD;  Location: [facility];  Service: Ophthalmology;  Laterality: Right;  CDE=9.61  . CATARACT EXTRACTION W/PHACO Left 12/26/2012   Procedure: CATARACT EXTRACTION PHACO AND INTRAOCULAR LENS PLACEMENT (IOC);  Surgeon: Elta Guadeloupe T. Gershon Crane, MD;  Location: [facility];  Service: Ophthalmology;  Laterality: Left;  CDE:7.74  . COLONOSCOPY  Jan 2002; 2012   2002: Dr. Tamala Julian, ext. hemorrhoids, nl colon; 2012-adenomatous polyps, gastritis on EGD  . CYSTOSCOPY W/ URETERAL STENT PLACEMENT Bilateral 02/25/2018   Procedure: CYSTOSCOPY WITH BILATERAL RETROGRADE PYELOGRAM/BILATERAL URETERAL STENT PLACEMENT, BLADDER BIOPSIES WITH FULGERATION;  Surgeon: [name], MD;  Location: [facility];  Service: Urology;  Laterality: Bilateral;  . CYSTOSCOPY/URETEROSCOPY/HOLMIUM LASER/STENT PLACEMENT Bilateral 03/21/2018   Procedure: CYSTOSCOPY/URETEROSCOPY/HOLMIUM LASER/STENT PLACEMENT;  Surgeon: [name], MD;  Location: [facility];  Service: Urology;  Laterality: Bilateral;  ONLY NEEDS 60 MIN  . DILATION AND CURETTAGE OF UTERUS    . ESOPHAGOGASTRODUODENOSCOPY  08/24/2011   ESOPHAGOGASTRODUODENOSCOPY; esophageal dilatation; Rogene Houston, MD;  . Freda Munro CAPSULE STUDY  08/18/2011   Procedure: GIVENS CAPSULE STUDY;  Surgeon: Rogene Houston, MD;  Location: [facility];  Service: Endoscopy;  Laterality: N/A;  730  . KNEE ARTHROSCOPY W/ MENISCECTOMY  90's   Left  . ORIF ANKLE FRACTURE Right 90's  . TONSILLECTOMY    . TOTAL KNEE  ARTHROPLASTY  10/20/2011   Procedure: TOTAL KNEE ARTHROPLASTY;  Surgeon: Ninetta Lights, MD;  Location: [facility];  Service: Orthopedics;  Laterality: Left;  . UPPER GASTROINTESTINAL ENDOSCOPY    . URETHRAL DILATION       Current Meds  Medication Sig  . ACCU-CHEK AVIVA PLUS test strip USE TO TEST BLOOD SUAGR 2 TIMES A DAY  . acetaminophen (TYLENOL) 500 MG tablet Take one tablet two times daily for chronic pain  . albuterol (VENTOLIN HFA) 108 (90 Base) MCG/ACT inhaler Inhale 2 puffs into the lungs every 6 (six) hours as needed for wheezing or shortness of breath.  Marland Kitchen amLODipine (NORVASC) 10 MG tablet Take 1 tablet (10 mg total) by mouth daily.  Marland Kitchen atorvastatin (LIPITOR) 40 MG tablet TAKE 1 TABLET BY MOUTH EVERY DAY  . Blood Glucose Monitoring Suppl (ACCU-CHEK AVIVA PLUS) w/Device KIT For once daily testing dx E11.9  . busPIRone (BUSPAR) 7.5 MG tablet TAKE 1 TABLET BY MOUTH 3 TIMES  DAILY (Patient taking differently: Take 7.5 mg by mouth 3 (three) times daily. )  . cloNIDine (CATAPRES) 0.2 MG tablet Take 1 tablet (0.2 mg total) by mouth at bedtime.  . clotrimazole-betamethasone (LOTRISONE) cream Apply 1 application topically 2 (two) times daily. D/c vusion ointment  . colchicine 0.6 MG tablet Take 1 tablet (0.6 mg total) by mouth daily. May decrease to once per day as needed for diarrhea  . COMBIGAN 0.2-0.5 % ophthalmic solution Place 1 drop into both eyes 2 (two) times daily.  Marland Kitchen conjugated estrogens (PREMARIN) vaginal cream Place vaginally 2 (two) times a week. Apply sparingly ( fingertip)  Twice weekly to vaginal area on Wednesdays and Fridays  . dabigatran (PRADAXA) 150 MG CAPS capsule Take 1 capsule (150 mg total) by mouth 2 (two) times daily.  Marland Kitchen docusate sodium (COLACE) 100 MG capsule Take 1 capsule (100 mg total) by mouth daily as needed for mild constipation.  Marland Kitchen donepezil (ARICEPT) 10 MG tablet Take 1 tablet (10 mg total) by mouth at bedtime.  . fluconazole (DIFLUCAN) 100 MG tablet Take 1 tablet  (100 mg total) by mouth daily for 2 days.  . Fluoxetine HCl, PMDD, 10 MG TABS Take 1 tablet (10 mg total) by mouth daily.  . hydrOXYzine (VISTARIL) 25 MG capsule TAKE 1 CAPSULE (25 MG TOTAL) BY MOUTH 3 (THREE) TIMES DAILY.  Marland Kitchen imipramine (TOFRANIL) 50 MG tablet TAKE 2 TABLETS (100 MG TOTAL) BY MOUTH AT BEDTIME.  . insulin NPH-regular Human (NOVOLIN 70/30) (70-30) 100 UNIT/ML injection Inject 40 Units into the skin 2 (two) times daily with a meal.  . Insulin Syringe-Needle U-100 (BD VEO INSULIN SYRINGE U/F) 31G X 15/64" 0.5 ML MISC Use to inject Novolin twice daily  . meclizine (ANTIVERT) 25 MG tablet TAKE 1 TABLET THREE TIMES DAILY AS NEEDED FOR DIZZINESS.  . montelukast (SINGULAIR) 10 MG tablet TAKE 1 TABLET BY MOUTH EVERYDAY AT BEDTIME  . nystatin (MYCOSTATIN/NYSTOP) powder Apply topically daily as needed (for rash under breasts).  Marland Kitchen olopatadine (PATANOL) 0.1 % ophthalmic solution Place 1 drop into both eyes 2 (two) times daily.  Marland Kitchen omeprazole (PRILOSEC) 40 MG capsule TAKE 1 CAPSULE BY MOUTH TWICE A DAY  . [START ON 12/22/2018] oxyCODONE-acetaminophen (PERCOCET) 10-325 MG tablet Take one tablet by mouth two times daily for chronic back pain  . polyethylene glycol (MIRALAX / GLYCOLAX) packet Take 17 g by mouth daily.  Marland Kitchen senna-docusate (SENOKOT-S) 8.6-50 MG tablet Take 2 tablets by mouth at bedtime. (Patient taking differently: Take 2 tablets by mouth 2 (two) times daily. )  . SUMAtriptan (IMITREX) 25 MG tablet Take 1 tablet (25 mg total) by mouth every 2 (two) hours as needed for migraine. May repeat in 2 hours if headache persists or recurs.  . SUMAtriptan (IMITREX) 5 MG/ACT nasal spray Place 1 spray into the nose every 2 (two) hours as needed for migraine.   . temazepam (RESTORIL) 30 MG capsule Take 1 capsule (30 mg total) by mouth at bedtime as needed for sleep.  Marland Kitchen topiramate (TOPAMAX) 100 MG tablet TAKE 1 TABLET BY MOUTH TWICE A DAY  . Vitamin D, Ergocalciferol, (DRISDOL) 50000 units CAPS capsule  TAKE 1 CAPSULE (50,000 UNITS TOTAL) BY MOUTH ONCE A WEEK. (Patient taking differently: Take 50,000 Units by mouth every 7 (seven) days. Saturday)     Allergies:   Penicillins, Fentanyl, Sulfonamide derivatives, and Codeine   Social History   Tobacco Use  . Smoking status: Never Smoker  . Smokeless tobacco: Never Used  Substance Use  Topics  . Alcohol use: No  . Drug use: No     Family Hx: The patient's family history includes Arthritis in her mother; Diabetes in her brother, brother, brother, brother, mother, and sister; Heart failure in her mother; Hypertension in her brother, brother, brother, brother, mother, and sister; Kidney failure in her brother, father, and mother; Leukemia in her father; Migraines in her father, mother, and sister; Pulmonary embolism in her sister. There is no history of Colon cancer or Colon polyps.  ROS:   Please see the history of present illness.     All other systems reviewed and are negative.   Prior CV studies:   The following studies were reviewed today:  CTA: 11/10/2018 FINDINGS: Cardiovascular: There are no filling defects within the pulmonary arteries to suggest pulmonary embolus. The thoracic aorta is normal in caliber without dissection. Mild aortic tortuosity and atherosclerosis. Borderline cardiomegaly. No pericardial effusion.  Mediastinum/Nodes: No enlarged mediastinal or hilar lymph nodes. Small hiatal hernia, esophagus is otherwise decompressed. No visualized thyroid nodule.  Lungs/Pleura: Calcified granuloma in the right upper lobe. No focal airspace disease. No pleural fluid or pulmonary edema. Mild breathing motion artifact in the lung bases. Trachea and mainstem bronchi are patent.  Upper Abdomen: No acute findings.  Musculoskeletal: There are no acute or suspicious osseous abnormalities.  Review of the MIP images confirms the above findings.  IMPRESSION: No pulmonary embolus or acute intrathoracic abnormality.   Aortic Atherosclerosis (ICD10-I70.0).   Echocardiogram: 11/11/2018 IMPRESSIONS    1. Left ventricular ejection fraction, by visual estimation, is 60 to 65%. The left ventricle has normal function. Normal left ventricular size. There is no left ventricular hypertrophy.  2. Left ventricular diastolic Doppler parameters are consistent with impaired relaxation pattern of LV diastolic filling.  3. Global right ventricle has normal systolic function.The right ventricular size is normal. No increase in right ventricular wall thickness.  4. Left atrial size was normal.  5. Right atrial size was normal.  6. The mitral valve is normal in structure. No evidence of mitral valve regurgitation. No evidence of mitral stenosis.  7. The tricuspid valve is normal in structure. Tricuspid valve regurgitation was not visualized by color flow Doppler.  8. The aortic valve is normal in structure. Aortic valve regurgitation is trivial by color flow Doppler. Mild aortic valve sclerosis without stenosis.  9. The pulmonic valve was normal in structure. Pulmonic valve regurgitation is not visualized by color flow Doppler. 10. The inferior vena cava is normal in size with greater than 50% respiratory variability, suggesting right atrial pressure of 3 mmHg.   Labs/Other Tests and Data Reviewed:    EKG:  An ECG dated 11/10/2018 was personally reviewed today and demonstrated:  sinus tachycardia, HR 113 with no acute ST abnormalities.   Recent Labs: 01/24/2018: B Natriuretic Peptide 63.0 11/11/2018: Hemoglobin 7.9; Magnesium 2.1; Platelets 512 12/04/2018: ALT 5; BUN 17; Creat 1.12; Potassium 4.7; Sodium 137; TSH 1.98   Recent Lipid Panel Lab Results  Component Value Date/Time   CHOL 135 12/04/2018 03:23 PM   TRIG 178 (H) 12/04/2018 03:23 PM   HDL 55 12/04/2018 03:23 PM   CHOLHDL 2.5 12/04/2018 03:23 PM   LDLCALC 55 12/04/2018 03:23 PM    Wt Readings from Last 3 Encounters:  12/06/18 239 lb (108.4 kg)   12/04/18 239 lb (108.4 kg)  11/12/18 224 lb 9.6 oz (101.9 kg)     Objective:    Vital Signs:  Ht 5' 6"  (1.676 m)   Wt 239  lb (108.4 kg)   BMI 38.58 kg/m    General: Pleasant female sounding in NAD Psych: Normal affect. Neuro: Alert and oriented X 3.  Lungs:  Resp regular and unlabored while talking on the phone.   ASSESSMENT & PLAN:    1. Atypical Chest Pain - Recently admitted for chest pain which was thought to be atypical for a cardiac etiology as her chest was tender to palpation and symptoms were worse with deep breathing and positional changes. Ruled out for ACS and echocardiogram showed a preserved EF of 60 to 65% with no regional wall motion abnormalities. - She denies any repeat episodes of chest pain since hospital discharge. She does have baseline dyspnea on exertion which has overall been stable. Continue with risk factor modification at this time.  2. HTN - She does not have a blood pressure cuff at home but reports BP has overall been well controlled at prior visits. Continue current medication regimen at this time with Amlodipine 10 mg daily and Clonidine 0.2 mg daily.  3. HLD - Followed by PCP. FLP earlier this month showed total cholesterol 135, HDL 55, triglycerides 178 and LDL 55.  Continue Atorvastatin 40 mg daily.  4. History of PE - She has remained on lifelong anticoagulation given her history of an unprovoked PE. Recent CTA last month was negative for a PE. Continue Pradaxa for anticoagulation. Previously intolerant to Xarelto and had difficulty maintaining therapeutic INR's with Coumadin in the past.  COVID-19 Education: The signs and symptoms of COVID-19 were discussed with the patient and how to seek care for testing (follow up with PCP or arrange E-visit).  The importance of social distancing was discussed today.  Time:   Today, I have spent 12 minutes with the patient with telehealth technology discussing the above problems.     Medication  Adjustments/Labs and Tests Ordered: Current medicines are reviewed at length with the patient today.  Concerns regarding medicines are outlined above.   Tests Ordered: No orders of the defined types were placed in this encounter.   Medication Changes: No orders of the defined types were placed in this encounter.   Follow Up:  In Person in 1 year(s)  Signed, Erma Heritage, PA-C  12/06/2018 4:46 PM    [facility]

## 2018-12-08 ENCOUNTER — Encounter: Payer: Self-pay | Admitting: Family Medicine

## 2018-12-08 DIAGNOSIS — B3731 Acute candidiasis of vulva and vagina: Secondary | ICD-10-CM | POA: Insufficient documentation

## 2018-12-08 DIAGNOSIS — B373 Candidiasis of vulva and vagina: Secondary | ICD-10-CM | POA: Insufficient documentation

## 2018-12-08 MED ORDER — OXYCODONE-ACETAMINOPHEN 10-325 MG PO TABS: ORAL_TABLET | ORAL | 0 refills | Status: AC

## 2018-12-08 NOTE — Assessment & Plan Note (Signed)
Controlled, no change in medication  

## 2018-12-08 NOTE — Assessment & Plan Note (Signed)
Fluconazole prescribed

## 2018-12-08 NOTE — Progress Notes (Addendum)
[name]     MRN: [id number]      DOB: 21-Nov-1940   HPI [name] is here for follow up and re-evaluation of chronic medical conditions, medication management and review of any available recent lab and radiology data.  Preventive health is updated, specifically  Immunization.   Questions or concerns regarding consultations or procedures which the PT has had in the interim are  addressed. The PT denies any adverse reactions to current medications since the last visit.  C/o vaginal itch and discharge C/o uncontrolled headaches, insufficient relief when they occur Denies polyuria, polydipsia, blurred vision , or hypoglycemic episodes. Reports good blood sugar values   ROS Denies recent fever or chills. Denies sinus pressure, nasal congestion, ear pain or sore throat. Denies chest congestion or , productive cough, she does however have a  chronic cough and intermitentor wheezing.which is worse at night when sleeping. Concern is that she is not getting enough oxygen when asleep and she also has a diagnosis of diastolic dysfunction Denies chest pains, palpitations and leg swelling Denies abdominal pain, nausea, vomiting,diarrhea or constipation.   Denies dysuria, frequency, hesitancy or incontinence. Denies uncontrolled  joint pain, swelling and limitation in mobility. Denies seizures,c/o lowe extremity numbness, and tingling. Denies uncontrolled  depression, anxiety or insomnia. Denies skin break down or rash.   PE  BP (!) 162/76   Pulse (!) 101   Temp 97.6 F (36.4 C) (Temporal)   Resp 15   Ht 5\' 7"  (1.702 m)   Wt 239 lb (108.4 kg)   SpO2 97%   BMI 37.43 kg/m   Patient alert and oriented and in no cardiopulmonary distress.  HEENT: No facial asymmetry, EOMI,     Neck decreased ROM .  Chest: Clear to auscultation bilaterally.  CVS: S1, S2 no murmurs, no S3.Regular rate.  ABD: Soft non tender.   Ext: No edema  MS: decreased  ROM spine, shoulders, hips and knees.   Skin: Intact, no ulcerations or rash noted.  Psych: Good eye contact, normal affect. Memory intact not anxious or depressed appearing.  CNS: CN 2-12 intact, power,  normal throughout.no focal deficits noted.   Assessment & Plan  Essential hypertension Elevated and uncontrolled at visit, may need to increase medication, will re assess at next visit, already on polypharmacy DASH diet and commitment to daily physical activity for a minimum of 30 minutes discussed and encouraged, as a part of hypertension management. The importance of attaining a healthy weight is also discussed.  BP/Weight 12/06/2018 12/04/2018 11/12/2018 09/27/2018 08/09/2018 07/03/2018 Q000111Q  Systolic BP - 0000000 Q000111Q [id number] Q000111Q  Diastolic BP - 76 86 74 62 62 80  Wt. (Lbs) 239 239 224.6 233 230 230.12 231  BMI 38.58 37.43 35.18 36.49 36.02 36.04 37.28       Diabetes mellitus, insulin dependent (IDDM), uncontrolled (HCC) Markedly improved and controlled [name] is reminded of the importance of commitment to daily physical activity for 30 minutes or more, as able and the need to limit carbohydrate intake to 30 to 60 grams per meal to help with blood sugar control.   The need to take medication as prescribed, test blood sugar as directed, and to call between visits if there is a concern that blood sugar is uncontrolled is also discussed.   [name] is reminded of the importance of daily foot exam, annual eye examination, and good blood sugar, blood pressure and cholesterol control.  Diabetic Labs Latest Ref Rng & Units  12/04/2018 11/11/2018 11/10/2018 07/03/2018 03/19/2018  HbA1c <5.7 % of total Hgb 7.5(H) - - 8.0(H) -  Microalbumin mg/dL 38.7 - - - -  Micro/Creat Ratio <30 mcg/mg creat 300(H) - - - -  Chol <200 mg/dL 135 113 - 119 -  HDL > OR = 50 mg/dL 55 47 - 49(L) -  Calc LDL mg/dL (calc) 55 46 - 47 -  Triglycerides <150 mg/dL 178(H) 102 - 144 -  Creatinine 0.60 - 0.93 mg/dL 1.12(H) 1.22(H) 1.21(H) 1.45(H)  1.09(H)   BP/Weight 12/06/2018 12/04/2018 11/12/2018 09/27/2018 08/09/2018 07/03/2018 Q000111Q  Systolic BP - 0000000 Q000111Q [id number] Q000111Q  Diastolic BP - 76 86 74 62 62 80  Wt. (Lbs) 239 239 224.6 233 230 230.12 231  BMI 38.58 37.43 35.18 36.49 36.02 36.04 37.28   Foot/eye exam completion dates Latest Ref Rng & Units 08/10/2017 07/14/2016  Eye Exam No Retinopathy - -  Foot exam Order - - -  Foot Form Completion - Done Done        Dyslipidemia Hyperlipidemia:Low fat diet discussed and encouraged.   Lipid Panel  Lab Results  Component Value Date   CHOL 135 12/04/2018   HDL 55 12/04/2018   [place] 55 12/04/2018   TRIG 178 (H) 12/04/2018   CHOLHDL 2.5 12/04/2018   Needs to reduce fat in diet    Morbid obesity ([place]) Obesity linked with hypertension and diabetes and arthritis  Patient re-educated about  the importance of commitment to a  minimum of 150 minutes of exercise per week as able.  The importance of healthy food choices with portion control discussed, as well as eating regularly and within a 12 hour window most days. The need to choose "clean , green" food 50 to 75% of the time is discussed, as well as to make water the primary drink and set a goal of 64 ounces water daily.    Weight /BMI 12/06/2018 12/04/2018 11/12/2018  WEIGHT 239 lb 239 lb 224 lb 9.6 oz  HEIGHT 5\' 6"  5\' 7"  -  BMI 38.58 kg/m2 37.43 kg/m2 -      Depression with anxiety Controlled, no change in medication   Migraine Uncontrolled , reports inadequate ablation of headaches when they occur, resume immitrex  Candidiasis of vulva and vagina Fluconazole prescribed  Encounter for chronic pain management The patient's Controlled Substance registry is reviewed and compliance confirmed. Adequacy of  Pain control and level of function is assessed. Medication dosing is adjusted as deemed appropriate. Twelve weeks of medication is prescribed , patient signs for the script and is provided with a follow up  appointment between 11 to 12 weeks .   Chronic bronchitis (HCC) Cough and chest congestion noted with increased frequency and severity at bedtime, nocturnal pulse oximetry ordered to determine her need for supplemental night time oxygen

## 2018-12-08 NOTE — Assessment & Plan Note (Signed)
Markedly improved and controlled [name] is reminded of the importance of commitment to daily physical activity for 30 minutes or more, as able and the need to limit carbohydrate intake to 30 to 60 grams per meal to help with blood sugar control.   The need to take medication as prescribed, test blood sugar as directed, and to call between visits if there is a concern that blood sugar is uncontrolled is also discussed.   [name] is reminded of the importance of daily foot exam, annual eye examination, and good blood sugar, blood pressure and cholesterol control.  Diabetic Labs Latest Ref Rng & Units 12/04/2018 11/11/2018 11/10/2018 07/03/2018 03/19/2018  HbA1c <5.7 % of total Hgb 7.5(H) - - 8.0(H) -  Microalbumin mg/dL 38.7 - - - -  Micro/Creat Ratio <30 mcg/mg creat 300(H) - - - -  Chol <200 mg/dL 135 113 - 119 -  HDL > OR = 50 mg/dL 55 47 - 49(L) -  Calc LDL mg/dL (calc) 55 46 - 47 -  Triglycerides <150 mg/dL 178(H) 102 - 144 -  Creatinine 0.60 - 0.93 mg/dL 1.12(H) 1.22(H) 1.21(H) 1.45(H) 1.09(H)   BP/Weight 12/06/2018 12/04/2018 11/12/2018 09/27/2018 08/09/2018 07/03/2018 Q000111Q  Systolic BP - 0000000 Q000111Q [id number] Q000111Q  Diastolic BP - 76 86 74 62 62 80  Wt. (Lbs) 239 239 224.6 233 230 230.12 231  BMI 38.58 37.43 35.18 36.49 36.02 36.04 37.28   Foot/eye exam completion dates Latest Ref Rng & Units 08/10/2017 07/14/2016  Eye Exam No Retinopathy - -  Foot exam Order - - -  Foot Form Completion - Done Done

## 2018-12-08 NOTE — Assessment & Plan Note (Signed)
Hyperlipidemia:Low fat diet discussed and encouraged.   Lipid Panel  Lab Results  Component Value Date   CHOL 135 12/04/2018   HDL 55 12/04/2018   LDLCALC 55 12/04/2018   TRIG 178 (H) 12/04/2018   CHOLHDL 2.5 12/04/2018   Needs to reduce fat in diet

## 2018-12-08 NOTE — Assessment & Plan Note (Signed)
The patient's Controlled Substance registry is reviewed and compliance confirmed. Adequacy of  Pain control and level of function is assessed. Medication dosing is adjusted as deemed appropriate. Twelve weeks of medication is prescribed , patient signs for the script and is provided with a follow up appointment between 11 to 12 weeks .  

## 2018-12-08 NOTE — Assessment & Plan Note (Signed)
Uncontrolled , reports inadequate ablation of headaches when they occur, resume immitrex

## 2018-12-08 NOTE — Assessment & Plan Note (Signed)
Elevated and uncontrolled at visit, may need to increase medication, will re assess at next visit, already on polypharmacy DASH diet and commitment to daily physical activity for a minimum of 30 minutes discussed and encouraged, as a part of hypertension management. The importance of attaining a healthy weight is also discussed.  BP/Weight 12/06/2018 12/04/2018 11/12/2018 09/27/2018 08/09/2018 07/03/2018 [id number]  Systolic BP - 0000000 [id number]  Diastolic BP - 76 86 74 62 62 80  Wt. (Lbs) 239 239 224.6 233 230 230.12 231  BMI 38.58 37.43 35.18 36.49 36.02 36.04 37.28

## 2018-12-08 NOTE — Assessment & Plan Note (Signed)
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  The importance of healthy food choices with portion control discussed, as well as eating regularly and within a 12 hour window most days. The need to choose "clean , green" food 50 to 75% of the time is discussed, as well as to make water the primary drink and set a goal of 64 ounces water daily.    Weight /BMI 12/06/2018 12/04/2018 11/12/2018  WEIGHT 239 lb 239 lb 224 lb 9.6 oz  HEIGHT 5\' 6"  5\' 7"  -  BMI 38.58 kg/m2 37.43 kg/m2 -

## 2018-12-10 DIAGNOSIS — G894 Chronic pain syndrome: Secondary | ICD-10-CM | POA: Diagnosis not present

## 2018-12-10 DIAGNOSIS — Z96659 Presence of unspecified artificial knee joint: Secondary | ICD-10-CM | POA: Diagnosis not present

## 2018-12-10 DIAGNOSIS — M1990 Unspecified osteoarthritis, unspecified site: Secondary | ICD-10-CM | POA: Diagnosis not present

## 2018-12-11 ENCOUNTER — Telehealth: Payer: Self-pay

## 2018-12-11 DIAGNOSIS — R0902 Hypoxemia: Secondary | ICD-10-CM

## 2018-12-20 ENCOUNTER — Ambulatory Visit: Payer: Medicare HMO | Admitting: Family Medicine

## 2018-12-21 DIAGNOSIS — R0902 Hypoxemia: Secondary | ICD-10-CM | POA: Diagnosis not present

## 2018-12-22 ENCOUNTER — Telehealth: Payer: Self-pay

## 2018-12-22 DIAGNOSIS — R0902 Hypoxemia: Secondary | ICD-10-CM

## 2018-12-22 NOTE — Telephone Encounter (Signed)
[name] is calling to follow up regarding pt --she was approved, please call back

## 2018-12-22 NOTE — Telephone Encounter (Signed)
Spoke with Caryl Pina and he stated patient qualified for overnight oxygen with her oxygen dropping down into the 60s and 70s during an overnight oximetry. Oxygen ordered.

## 2018-12-22 NOTE — Telephone Encounter (Signed)
Left message requesting  call back.

## 2018-12-25 ENCOUNTER — Other Ambulatory Visit: Payer: Self-pay | Admitting: Family Medicine

## 2018-12-25 ENCOUNTER — Telehealth: Payer: Self-pay

## 2018-12-25 MED ORDER — OXYCODONE-ACETAMINOPHEN 10-325 MG PO TABS: ORAL_TABLET | ORAL | 0 refills | Status: AC

## 2018-12-25 NOTE — Telephone Encounter (Signed)
[name] is calling, the current drugstore is out of pain meds, she would like it called in to [facility] in [place] have some

## 2018-12-25 NOTE — Telephone Encounter (Signed)
Sent pls let her know

## 2018-12-26 ENCOUNTER — Other Ambulatory Visit: Payer: Self-pay

## 2018-12-26 DIAGNOSIS — J42 Unspecified chronic bronchitis: Secondary | ICD-10-CM

## 2018-12-27 ENCOUNTER — Telehealth: Payer: Self-pay

## 2018-12-27 DIAGNOSIS — J42 Unspecified chronic bronchitis: Secondary | ICD-10-CM | POA: Diagnosis not present

## 2018-12-27 DIAGNOSIS — R0902 Hypoxemia: Secondary | ICD-10-CM | POA: Diagnosis not present

## 2018-12-27 NOTE — Telephone Encounter (Signed)
[facility] got the order for oxygen but they need the last OV addended to mention the chronic bronchitis  And faxed back to them

## 2018-12-27 NOTE — Assessment & Plan Note (Signed)
Cough and chest congestion noted with increased frequency and severity at bedtime, nocturnal pulse oximetry ordered to determine her need for supplemental night time oxygen

## 2018-12-27 NOTE — Telephone Encounter (Signed)
Note faxed.

## 2018-12-27 NOTE — Telephone Encounter (Signed)
Most recent note is addended,

## 2019-01-08 ENCOUNTER — Ambulatory Visit: Payer: Medicare HMO | Admitting: Family Medicine

## 2019-01-10 DIAGNOSIS — Z96659 Presence of unspecified artificial knee joint: Secondary | ICD-10-CM | POA: Diagnosis not present

## 2019-01-10 DIAGNOSIS — G894 Chronic pain syndrome: Secondary | ICD-10-CM | POA: Diagnosis not present

## 2019-01-10 DIAGNOSIS — M1990 Unspecified osteoarthritis, unspecified site: Secondary | ICD-10-CM | POA: Diagnosis not present

## 2019-01-22 ENCOUNTER — Telehealth: Payer: Self-pay | Admitting: *Deleted

## 2019-01-22 ENCOUNTER — Other Ambulatory Visit: Payer: Self-pay | Admitting: Family Medicine

## 2019-01-22 MED ORDER — OXYCODONE-ACETAMINOPHEN 10-325 MG PO TABS: ORAL_TABLET | ORAL | 0 refills | Status: AC

## 2019-01-22 NOTE — Telephone Encounter (Signed)
Medication for Jan and feb are sent, she needs to keep Jan appt,. pls let pt know

## 2019-01-22 NOTE — Telephone Encounter (Signed)
Left message letting [name] know that medication has been sent and patient needs to keep Jan appt

## 2019-01-22 NOTE — Telephone Encounter (Signed)
[name] pt sister called saying pt was out of her pain medication. [facility] is out of medication and will not be getting any in this year. She would like pt pain meds sent to [facility] as they were last time since [facility] is out of the medication

## 2019-01-25 ENCOUNTER — Telehealth: Payer: Self-pay | Admitting: *Deleted

## 2019-01-25 NOTE — Telephone Encounter (Signed)
Caryl Pina from [employer] called the form that was faxed on 12-27-18 question 4 was marked yes and this needs to be marked D for does not apply and cross out the Y and initial and date by Dr Moshe Cipro and faxed back. It is due to the fact the patient only uses it at night.

## 2019-01-26 ENCOUNTER — Ambulatory Visit (INDEPENDENT_AMBULATORY_CARE_PROVIDER_SITE_OTHER): Payer: Medicare HMO | Admitting: Family Medicine

## 2019-01-26 ENCOUNTER — Encounter: Payer: Self-pay | Admitting: Family Medicine

## 2019-01-26 ENCOUNTER — Other Ambulatory Visit: Payer: Self-pay

## 2019-01-26 ENCOUNTER — Telehealth: Payer: Self-pay | Admitting: *Deleted

## 2019-01-26 DIAGNOSIS — R1310 Dysphagia, unspecified: Secondary | ICD-10-CM

## 2019-01-26 DIAGNOSIS — R630 Anorexia: Secondary | ICD-10-CM | POA: Diagnosis not present

## 2019-01-26 DIAGNOSIS — R1319 Other dysphagia: Secondary | ICD-10-CM

## 2019-01-26 DIAGNOSIS — R0989 Other specified symptoms and signs involving the circulatory and respiratory systems: Secondary | ICD-10-CM

## 2019-01-26 DIAGNOSIS — J42 Unspecified chronic bronchitis: Secondary | ICD-10-CM | POA: Diagnosis not present

## 2019-01-26 DIAGNOSIS — R09A2 Foreign body sensation, throat: Secondary | ICD-10-CM

## 2019-01-26 DIAGNOSIS — R0902 Hypoxemia: Secondary | ICD-10-CM | POA: Diagnosis not present

## 2019-01-26 NOTE — Telephone Encounter (Signed)
Pt said dr Moshe Cipro was going to send her to someone to stretch throat she has not heard from anyone and was wanting to follow up on it

## 2019-01-26 NOTE — Progress Notes (Signed)
Virtual Visit via Telephone Note   This visit type was conducted due to national recommendations for restrictions regarding the COVID-19 Pandemic (e.g. social distancing) in an effort to limit this patient's exposure and mitigate transmission in our community.  Due to her co-morbid illnesses, this patient is at least at moderate risk for complications without adequate follow up.  This format is felt to be most appropriate for this patient at this time.  The patient did not have access to video technology/had technical difficulties with video requiring transitioning to audio format only (telephone).  All issues noted in this document were discussed and addressed.  No physical exam could be performed with this format.    Evaluation Performed:  Follow-up visit  Date:  01/26/2019   ID:  [name], DOB Apr 26, 1940, MRN [id number]  Patient Location: Home Provider Location: Office  Location of Patient: Home Location of Provider: Telehealth Consent was obtain for visit to be over via telehealth. I verified that I am speaking with the correct person using two identifiers.  PCP:  [name], MD   Chief Complaint:    History of Present Illness:    [name] is a 78 y.o. female with history of migraine, hypertension, chronic bronchitis, GERD, esophageal dysphagia among others.  Presents today with GERD/choking on food having a hard time swallowing, emesis, feeling like there is a lump in her throat and cough related to this.  Denies having any fever, chills, recent exposure to anyone that has been sick with flu, sick-like symptoms and or Covid.  Reports taking all her medication as directed.  Extensive history with esophageal issues.  Reports taking her Prilosec with other medications.  Has not been taking it by itself.   The patient does not have symptoms concerning for COVID-19 infection (fever, chills, cough, or new shortness of breath).  Past Medical, Surgical, Social  History, Allergies, and Medications have been Reviewed.  . Past Medical History:  Diagnosis Date  . Anemia, iron deficiency 2012   Evaluated by Dr. Oneida Alar; H&H of 9.3/30.8 with [facility] in 10/2010; 3/3 positive Hemoccult cards in 07/2011  . Anxiety    was on Prozac for a couple of weeks  . Atrial fibrillation ([place])    takes Coumadin daily  . Bruises easily    takes Coumadin  . Chronic anticoagulation 07/21/2010  . Chronic back pain    related to knee pain  . Degenerative joint disease    Knees  . Dementia ([place])   . Depression   . Diabetes mellitus    takes Metformin daily  . Gastroesophageal reflux disease    takes Omeprazole daily  . Glaucoma   . Glaucoma    uses eye drops at night  . History of blood transfusion 1969  . History of gout    was on medication but taken off of several months ago  . Hx of colonic polyps   . Hx of migraines    last one about a yr ago;takes Topamax daily  . Hyperlipidemia    takes Pravastatin daily  . Hypertension    takes Hyzaar daily and Propranolol   . Insomnia    takes Restoril prn  . Joint pain   . Joint swelling   . Memory loss    takes donepezil  . Migraine   . Migraine   . Nocturia   . Overweight(278.02)   . Pneumonia 1969   hx of  . Pulmonary embolism [facility]) May 2012  acute presentation, bilateral PE  . Skin spots-aging   . Urinary frequency    Past Surgical History:  Procedure Laterality Date  . CATARACT EXTRACTION W/PHACO Right 04/11/2012   Procedure: CATARACT EXTRACTION PHACO AND INTRAOCULAR LENS PLACEMENT (IOC);  Surgeon: Elta Guadeloupe T. Gershon Crane, MD;  Location: [facility];  Service: Ophthalmology;  Laterality: Right;  CDE=9.61  . CATARACT EXTRACTION W/PHACO Left 12/26/2012   Procedure: CATARACT EXTRACTION PHACO AND INTRAOCULAR LENS PLACEMENT (IOC);  Surgeon: Elta Guadeloupe T. Gershon Crane, MD;  Location: [facility];  Service: Ophthalmology;  Laterality: Left;  CDE:7.74  . COLONOSCOPY  Jan 2002; 2012   2002: Dr. Tamala Julian, ext. hemorrhoids, nl colon;  2012-adenomatous polyps, gastritis on EGD  . CYSTOSCOPY W/ URETERAL STENT PLACEMENT Bilateral 02/25/2018   Procedure: CYSTOSCOPY WITH BILATERAL RETROGRADE PYELOGRAM/BILATERAL URETERAL STENT PLACEMENT, BLADDER BIOPSIES WITH FULGERATION;  Surgeon: [name], MD;  Location: [facility];  Service: Urology;  Laterality: Bilateral;  . CYSTOSCOPY/URETEROSCOPY/HOLMIUM LASER/STENT PLACEMENT Bilateral 03/21/2018   Procedure: CYSTOSCOPY/URETEROSCOPY/HOLMIUM LASER/STENT PLACEMENT;  Surgeon: [name], MD;  Location: [facility];  Service: Urology;  Laterality: Bilateral;  ONLY NEEDS 60 MIN  . DILATION AND CURETTAGE OF UTERUS    . ESOPHAGOGASTRODUODENOSCOPY  08/24/2011   ESOPHAGOGASTRODUODENOSCOPY; esophageal dilatation; Rogene Houston, MD;  . Freda Munro CAPSULE STUDY  08/18/2011   Procedure: GIVENS CAPSULE STUDY;  Surgeon: Rogene Houston, MD;  Location: [facility];  Service: Endoscopy;  Laterality: N/A;  730  . KNEE ARTHROSCOPY W/ MENISCECTOMY  90's   Left  . ORIF ANKLE FRACTURE Right 90's  . TONSILLECTOMY    . TOTAL KNEE ARTHROPLASTY  10/20/2011   Procedure: TOTAL KNEE ARTHROPLASTY;  Surgeon: Ninetta Lights, MD;  Location: [facility];  Service: Orthopedics;  Laterality: Left;  . UPPER GASTROINTESTINAL ENDOSCOPY    . URETHRAL DILATION       Current Meds  Medication Sig  . ACCU-CHEK AVIVA PLUS test strip USE TO TEST BLOOD SUAGR 2 TIMES A DAY  . acetaminophen (TYLENOL) 500 MG tablet Take one tablet two times daily for chronic pain  . albuterol (VENTOLIN HFA) 108 (90 Base) MCG/ACT inhaler Inhale 2 puffs into the lungs every 6 (six) hours as needed for wheezing or shortness of breath.  Marland Kitchen amLODipine (NORVASC) 10 MG tablet Take 1 tablet (10 mg total) by mouth daily.  Marland Kitchen atorvastatin (LIPITOR) 40 MG tablet TAKE 1 TABLET BY MOUTH EVERY DAY  . Blood Glucose Monitoring Suppl (ACCU-CHEK AVIVA PLUS) w/Device KIT For once daily testing dx E11.9  . busPIRone (BUSPAR) 7.5 MG tablet TAKE 1 TABLET BY  MOUTH 3 TIMES DAILY (Patient taking differently: Take 7.5 mg by mouth 3 (three) times daily. )  . cloNIDine (CATAPRES) 0.2 MG tablet Take 1 tablet (0.2 mg total) by mouth at bedtime.  . clotrimazole-betamethasone (LOTRISONE) cream Apply 1 application topically 2 (two) times daily. D/c vusion ointment  . colchicine 0.6 MG tablet Take 1 tablet (0.6 mg total) by mouth daily. May decrease to once per day as needed for diarrhea  . COMBIGAN 0.2-0.5 % ophthalmic solution Place 1 drop into both eyes 2 (two) times daily.  Marland Kitchen conjugated estrogens (PREMARIN) vaginal cream Place vaginally 2 (two) times a week. Apply sparingly ( fingertip)  Twice weekly to vaginal area on Wednesdays and Fridays  . dabigatran (PRADAXA) 150 MG CAPS capsule Take 1 capsule (150 mg total) by mouth 2 (two) times daily.  Marland Kitchen docusate sodium (COLACE) 100 MG capsule Take 1 capsule (100 mg total) by mouth daily as needed for mild  constipation.  Marland Kitchen donepezil (ARICEPT) 10 MG tablet Take 1 tablet (10 mg total) by mouth at bedtime.  . Fluoxetine HCl, PMDD, 10 MG TABS Take 1 tablet (10 mg total) by mouth daily.  . hydrOXYzine (VISTARIL) 25 MG capsule TAKE 1 CAPSULE (25 MG TOTAL) BY MOUTH 3 (THREE) TIMES DAILY.  Marland Kitchen imipramine (TOFRANIL) 50 MG tablet TAKE 2 TABLETS (100 MG TOTAL) BY MOUTH AT BEDTIME.  . insulin NPH-regular Human (NOVOLIN 70/30) (70-30) 100 UNIT/ML injection Inject 40 Units into the skin 2 (two) times daily with a meal.  . Insulin Syringe-Needle U-100 (BD VEO INSULIN SYRINGE U/F) 31G X 15/64" 0.5 ML MISC Use to inject Novolin twice daily  . meclizine (ANTIVERT) 25 MG tablet TAKE 1 TABLET THREE TIMES DAILY AS NEEDED FOR DIZZINESS.  . montelukast (SINGULAIR) 10 MG tablet TAKE 1 TABLET BY MOUTH EVERYDAY AT BEDTIME  . nystatin (MYCOSTATIN/NYSTOP) powder Apply topically daily as needed (for rash under breasts).  Marland Kitchen olopatadine (PATANOL) 0.1 % ophthalmic solution Place 1 drop into both eyes 2 (two) times daily.  Marland Kitchen omeprazole (PRILOSEC) 40  MG capsule TAKE 1 CAPSULE BY MOUTH TWICE A DAY  . oxyCODONE-acetaminophen (PERCOCET) 10-325 MG tablet Take one tablet by mouth two times daily for chronic pain  . [START ON 02/20/2019] oxyCODONE-acetaminophen (PERCOCET) 10-325 MG tablet Take one tablet by mouth two times daily for chronic pain  . oxyCODONE-acetaminophen (PERCOCET) 10-325 MG tablet Take one tablet by mouth two times daily for chronic pain  . [START ON 02/22/2019] oxyCODONE-acetaminophen (PERCOCET) 10-325 MG tablet Take one tablet by mouth two times daily for chronic back pain  . polyethylene glycol (MIRALAX / GLYCOLAX) packet Take 17 g by mouth daily.  Marland Kitchen senna-docusate (SENOKOT-S) 8.6-50 MG tablet Take 2 tablets by mouth at bedtime. (Patient taking differently: Take 2 tablets by mouth 2 (two) times daily. )  . SUMAtriptan (IMITREX) 25 MG tablet Take 1 tablet (25 mg total) by mouth every 2 (two) hours as needed for migraine. May repeat in 2 hours if headache persists or recurs.  . SUMAtriptan (IMITREX) 5 MG/ACT nasal spray Place 1 spray into the nose every 2 (two) hours as needed for migraine.   . temazepam (RESTORIL) 30 MG capsule Take 1 capsule (30 mg total) by mouth at bedtime as needed for sleep.  Marland Kitchen topiramate (TOPAMAX) 100 MG tablet TAKE 1 TABLET BY MOUTH TWICE A DAY  . Vitamin D, Ergocalciferol, (DRISDOL) 50000 units CAPS capsule TAKE 1 CAPSULE (50,000 UNITS TOTAL) BY MOUTH ONCE A WEEK. (Patient taking differently: Take 50,000 Units by mouth every 7 (seven) days. Saturday)     Allergies:   Penicillins, Fentanyl, Sulfonamide derivatives, and Codeine   Social History   Tobacco Use  . Smoking status: Never Smoker  . Smokeless tobacco: Never Used  Substance Use Topics  . Alcohol use: No  . Drug use: No     Family Hx: The patient's family history includes Arthritis in her mother; Diabetes in her brother, brother, brother, brother, mother, and sister; Heart failure in her mother; Hypertension in her brother, brother, brother,  brother, mother, and sister; Kidney failure in her brother, father, and mother; Leukemia in her father; Migraines in her father, mother, and sister; Pulmonary embolism in her sister. There is no history of Colon cancer or Colon polyps.  ROS:   Please see the history of present illness.    All other systems reviewed and are negative.   Labs/Other Tests and Data Reviewed:    Recent Labs: 11/11/2018: Hemoglobin  7.9; Magnesium 2.1; Platelets 512 12/04/2018: ALT 5; BUN 17; Creat 1.12; Potassium 4.7; Sodium 137; TSH 1.98   Recent Lipid Panel Lab Results  Component Value Date/Time   CHOL 135 12/04/2018 03:23 PM   TRIG 178 (H) 12/04/2018 03:23 PM   HDL 55 12/04/2018 03:23 PM   CHOLHDL 2.5 12/04/2018 03:23 PM   LDLCALC 55 12/04/2018 03:23 PM    Wt Readings from Last 3 Encounters:  12/06/18 239 lb (108.4 kg)  12/04/18 239 lb (108.4 kg)  11/12/18 224 lb 9.6 oz (101.9 kg)     Objective:    Vital Signs:  There were no vitals taken for this visit.   GEN:  Alert and oriented RESPIRATORY:  No shortness of breath noted in conversation PSYCH:  Normal affect and mood, good communication  ASSESSMENT & PLAN:     1. Globus pharyngeus Excessive GERD history.  Along with esophageal dysphagia.  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  Follow-up with GI might be pertinent.  For possible swallowing test as well.  I have advised her to take Prilosec 1 hour before eating or taking any other medications to help with possible reflux making this worse.Patient acknowledged agreement and understanding of the plan.    2. Esophageal dysphagia Possible swallow study needed in the near future.  And or follow-up with GI.  As stated above advised on new way of taking Prilosec to see if that helps with some of the symptoms.  3. Decreased appetite Decreased appetite educated on the increase production of acid when you do not eat.  And how this can contribute to some of her symptoms.  Strongly  encouraged her to at least drink broth or other types of foods or soft foods or bland foods if she is not hungry just to kind to help keep the acid from developing.    Time:   Today, I have spent 10 minutes with the patient with telehealth technology discussing the above problems.     Medication Adjustments/Labs and Tests Ordered: Current medicines are reviewed at length with the patient today.  Concerns regarding medicines are outlined above.   Tests Ordered: No orders of the defined types were placed in this encounter.   Medication Changes: No orders of the defined types were placed in this encounter.   Disposition:  Follow up as already scheduled.  Signed, Perlie Mayo, NP  01/26/2019 11:49 AM     [place]

## 2019-01-26 NOTE — Patient Instructions (Signed)
  I appreciate the opportunity to provide you with care for your health and wellness. Today we discussed: "lump in throat and decreased appetite"  Follow up: as scheduled  No labs or referrals today  Start taking Prilosec on an empty stomach 1 hour before eating or other medications. It works best this way. Possible need for referral to a GI doctor and or swallowing test.  Make sure you are eating to prevent acid build up. Also chew and swallow foods well, take small bites and follow with liquid.  I hope you have a wonderful, happy, safe, and healthy Holiday Season! See you in the New Year :)  Please continue to practice social distancing to keep you, your family, and our community safe.  If you must go out, please wear a mask and practice good handwashing.  It was a pleasure to see you and I look forward to continuing to work together on your health and well-being. Please do not hesitate to call the office if you need care or have questions about your care.  Have a wonderful day and week. With Gratitude, Cherly Beach, DNP, AGNP-BC

## 2019-01-30 NOTE — Telephone Encounter (Signed)
Has completed form and will fax back once Dr has signed

## 2019-02-09 DIAGNOSIS — G894 Chronic pain syndrome: Secondary | ICD-10-CM | POA: Diagnosis not present

## 2019-02-09 DIAGNOSIS — Z96659 Presence of unspecified artificial knee joint: Secondary | ICD-10-CM | POA: Diagnosis not present

## 2019-02-09 DIAGNOSIS — M1990 Unspecified osteoarthritis, unspecified site: Secondary | ICD-10-CM | POA: Diagnosis not present

## 2019-02-19 ENCOUNTER — Telehealth: Payer: Self-pay | Admitting: *Deleted

## 2019-02-19 ENCOUNTER — Other Ambulatory Visit: Payer: Self-pay | Admitting: Family Medicine

## 2019-02-19 MED ORDER — OXYCODONE-ACETAMINOPHEN 10-325 MG PO TABS: ORAL_TABLET | ORAL | 0 refills | Status: DC

## 2019-02-19 NOTE — Telephone Encounter (Signed)
Pt has appt 03-14-19 kaylene stated pt would be out of her pain medicine on 02-20-18 and wanted to know if Dr Moshe Cipro would fill to hold her over until then. Just wanted to see what she needed to do.

## 2019-02-19 NOTE — Telephone Encounter (Signed)
Please advise 

## 2019-02-19 NOTE — Telephone Encounter (Signed)
1 month sent to [facility]

## 2019-02-20 NOTE — Telephone Encounter (Signed)
Patient sister aware

## 2019-02-26 DIAGNOSIS — J42 Unspecified chronic bronchitis: Secondary | ICD-10-CM | POA: Diagnosis not present

## 2019-02-26 DIAGNOSIS — R0902 Hypoxemia: Secondary | ICD-10-CM | POA: Diagnosis not present

## 2019-02-28 ENCOUNTER — Other Ambulatory Visit: Payer: Self-pay | Admitting: Cardiology

## 2019-03-12 DIAGNOSIS — G894 Chronic pain syndrome: Secondary | ICD-10-CM | POA: Diagnosis not present

## 2019-03-12 DIAGNOSIS — Z96659 Presence of unspecified artificial knee joint: Secondary | ICD-10-CM | POA: Diagnosis not present

## 2019-03-12 DIAGNOSIS — M1990 Unspecified osteoarthritis, unspecified site: Secondary | ICD-10-CM | POA: Diagnosis not present

## 2019-03-14 ENCOUNTER — Encounter: Payer: Medicare HMO | Admitting: Family Medicine

## 2019-03-15 ENCOUNTER — Other Ambulatory Visit: Payer: Self-pay | Admitting: Family Medicine

## 2019-03-19 ENCOUNTER — Other Ambulatory Visit: Payer: Self-pay

## 2019-03-19 ENCOUNTER — Ambulatory Visit (INDEPENDENT_AMBULATORY_CARE_PROVIDER_SITE_OTHER): Payer: Medicare HMO | Admitting: Family Medicine

## 2019-03-19 ENCOUNTER — Encounter: Payer: Self-pay | Admitting: Family Medicine

## 2019-03-19 VITALS — BP 162/76 | Ht 66.0 in | Wt 239.0 lb

## 2019-03-19 DIAGNOSIS — E785 Hyperlipidemia, unspecified: Secondary | ICD-10-CM

## 2019-03-19 DIAGNOSIS — I10 Essential (primary) hypertension: Secondary | ICD-10-CM | POA: Diagnosis not present

## 2019-03-19 DIAGNOSIS — G8929 Other chronic pain: Secondary | ICD-10-CM | POA: Diagnosis not present

## 2019-03-19 DIAGNOSIS — E1149 Type 2 diabetes mellitus with other diabetic neurological complication: Secondary | ICD-10-CM | POA: Diagnosis not present

## 2019-03-19 DIAGNOSIS — F418 Other specified anxiety disorders: Secondary | ICD-10-CM

## 2019-03-19 DIAGNOSIS — R7301 Impaired fasting glucose: Secondary | ICD-10-CM | POA: Diagnosis not present

## 2019-03-19 MED ORDER — OXYCODONE-ACETAMINOPHEN 10-325 MG PO TABS: ORAL_TABLET | ORAL | 0 refills | Status: DC

## 2019-03-19 NOTE — Patient Instructions (Addendum)
F/U in 12 weeks with MD for pain management ,call if you need me sooner  Please reschedule her physical exam for a morning appointment with me for last week in Feb or early March  Please get non fasting HBA1C.chem 7 and EGFr  And CBC Feb 19 or after  Please be careful, no more falls!   Fall Prevention in the Home, Adult Falls can cause injuries. They can happen to people of all ages. There are many things you can do to make your home safe and to help prevent falls. Ask for help when making these changes, if needed. What actions can I take to prevent falls? General Instructions  Use good lighting in all rooms. Replace any light bulbs that burn out.  Turn on the lights when you go into a dark area. Use night-lights.  Keep items that you use often in easy-to-reach places. Lower the shelves around your home if necessary.  Set up your furniture so you have a clear path. Avoid moving your furniture around.  Do not have throw rugs and other things on the floor that can make you trip.  Avoid walking on wet floors.  If any of your floors are uneven, fix them.  Add color or contrast paint or tape to clearly mark and help you see: ? Any grab bars or handrails. ? First and last steps of stairways. ? Where the edge of each step is.  If you use a stepladder: ? Make sure that it is fully opened. Do not climb a closed stepladder. ? Make sure that both sides of the stepladder are locked into place. ? Ask someone to hold the stepladder for you while you use it.  If there are any pets around you, be aware of where they are. What can I do in the bathroom?      Keep the floor dry. Clean up any water that spills onto the floor as soon as it happens.  Remove soap buildup in the tub or shower regularly.  Use non-skid mats or decals on the floor of the tub or shower.  Attach bath mats securely with double-sided, non-slip rug tape.  If you need to sit down in the shower, use a plastic,  non-slip stool.  Install grab bars by the toilet and in the tub and shower. Do not use towel bars as grab bars. What can I do in the bedroom?  Make sure that you have a light by your bed that is easy to reach.  Do not use any sheets or blankets that are too big for your bed. They should not hang down onto the floor.  Have a firm chair that has side arms. You can use this for support while you get dressed. What can I do in the kitchen?  Clean up any spills right away.  If you need to reach something above you, use a strong step stool that has a grab bar.  Keep electrical cords out of the way.  Do not use floor polish or wax that makes floors slippery. If you must use wax, use non-skid floor wax. What can I do with my stairs?  Do not leave any items on the stairs.  Make sure that you have a light switch at the top of the stairs and the bottom of the stairs. If you do not have them, ask someone to add them for you.  Make sure that there are handrails on both sides of the stairs, and use them. Fix  handrails that are broken or loose. Make sure that handrails are as long as the stairways.  Install non-slip stair treads on all stairs in your home.  Avoid having throw rugs at the top or bottom of the stairs. If you do have throw rugs, attach them to the floor with carpet tape.  Choose a carpet that does not hide the edge of the steps on the stairway.  Check any carpeting to make sure that it is firmly attached to the stairs. Fix any carpet that is loose or worn. What can I do on the outside of my home?  Use bright outdoor lighting.  Regularly fix the edges of walkways and driveways and fix any cracks.  Remove anything that might make you trip as you walk through a door, such as a raised step or threshold.  Trim any bushes or trees on the path to your home.  Regularly check to see if handrails are loose or broken. Make sure that both sides of any steps have handrails.  Install  guardrails along the edges of any raised decks and porches.  Clear walking paths of anything that might make someone trip, such as tools or rocks.  Have any leaves, snow, or ice cleared regularly.  Use sand or salt on walking paths during winter.  Clean up any spills in your garage right away. This includes grease or oil spills. What other actions can I take?  Wear shoes that: ? Have a low heel. Do not wear high heels. ? Have rubber bottoms. ? Are comfortable and fit you well. ? Are closed at the toe. Do not wear open-toe sandals.  Use tools that help you move around (mobility aids) if they are needed. These include: ? Canes. ? Walkers. ? Scooters. ? Crutches.  Review your medicines with your doctor. Some medicines can make you feel dizzy. This can increase your chance of falling. Ask your doctor what other things you can do to help prevent falls. Where to find more information  Centers for Disease Control and Prevention, STEADI: https://garcia.biz/  [employer] on Aging: BrainJudge.co.uk Contact a doctor if:  You are afraid of falling at home.  You feel weak, drowsy, or dizzy at home.  You fall at home. Summary  There are many simple things that you can do to make your home safe and to help prevent falls.  Ways to make your home safe include removing tripping hazards and installing grab bars in the bathroom.  Ask for help when making these changes in your home. This information is not intended to replace advice given to you by your health care provider. Make sure you discuss any questions you have with your health care provider. Document Revised: 05/25/2018 Document Reviewed: 09/16/2016 Elsevier Patient Education  2020 [employer].

## 2019-03-22 ENCOUNTER — Encounter: Payer: Medicare HMO | Admitting: Family Medicine

## 2019-03-25 ENCOUNTER — Encounter: Payer: Self-pay | Admitting: Family Medicine

## 2019-03-25 MED ORDER — OXYCODONE-ACETAMINOPHEN 10-325 MG PO TABS: ORAL_TABLET | ORAL | 0 refills | Status: DC

## 2019-03-25 NOTE — Progress Notes (Signed)
Virtual Visit via Telephone Note  I connected with [name] on 03/25/19 at  4:00 PM EST by telephone and verified that I am speaking with the correct person using two identifiers.  Location: Patient: home Provider: office   I discussed the limitations, risks, security and privacy concerns of performing an evaluation and management service by telephone and the availability of in person appointments. I also discussed with the patient that there may be a patient responsible charge related to this service. The patient expressed understanding and agreed to proceed.   History of Present Illness: F/U chronic problems, medication review, and refill medication when necessary. Review most recent labs and order labs which are due Review preventive health and update with necessary referrals or immunizations as indicated Denies recent fever or chills. Denies sinus pressure, nasal congestion, ear pain or sore throat. Denies chest congestion, productive cough or wheezing. Denies chest pains, palpitations and leg swelling Denies abdominal pain, nausea, vomiting,diarrhea or constipation.   Denies dysuria, frequency, hesitancy or incontinence. C/o chronic  joint pain, swelling and limitation in mobility. Denies headaches, seizures,  Denies uncontrolled  depression, anxiety or insomnia. Denies skin break down or rash. Denies polyuria, polydipsia, blurred vision , or hypoglycemic episodes.        Observations/Objective:  BP (!) 162/76   Ht 5\' 6"  (1.676 m)   Wt 239 lb (108.4 kg)   BMI 38.58 kg/m  Good communication . Due to difficulty hearing, sister [name] answered  Most questions and raised any concerns. No new concerns Alert and oriented x 3 No signs of respiratory distress during speech   Assessment and Plan: Essential hypertension Uncontrolled, needs in office eval for med management  DASH diet and commitment to daily physical activity for a minimum of 30 minutes discussed and  encouraged, as a part of hypertension management. The importance of attaining a healthy weight is also discussed.  BP/Weight 03/19/2019 12/06/2018 12/04/2018 11/12/2018 09/27/2018 08/09/2018 0000000  Systolic BP 0000000 - 0000000 Q000111Q [id number]  Diastolic BP 76 - 76 86 74 62 62  Wt. (Lbs) 239 239 239 224.6 233 230 230.12  BMI 38.58 38.58 37.43 35.18 36.49 36.02 36.04       Encounter for chronic pain management The patient's Controlled Substance registry is reviewed and compliance confirmed. Adequacy of  Pain control and level of function is assessed. Medication dosing is adjusted as deemed appropriate. Twelve weeks of medication is prescribed , patient signs for the script and is provided with a follow up appointment between 11 to 12 weeks .   Morbid obesity ([place]) Obesity linked with hypertension and dpression  Patient re-educated about  the importance of commitment to a  minimum of 150 minutes of exercise per week as able.  The importance of healthy food choices with portion control discussed, as well as eating regularly and within a 12 hour window most days. The need to choose "clean , green" food 50 to 75% of the time is discussed, as well as to make water the primary drink and set a goal of 64 ounces water daily.    Weight /BMI 03/19/2019 12/06/2018 12/04/2018  WEIGHT 239 lb 239 lb 239 lb  HEIGHT 5\' 6"  5\' 6"  5\' 7"   BMI 38.58 kg/m2 38.58 kg/m2 37.43 kg/m2      Dyslipidemia Hyperlipidemia:Low fat diet discussed and encouraged.  Needs to reduce fat in diet , TG elevated   Lipid Panel  Lab Results  Component Value Date   CHOL 135 12/04/2018  HDL 55 12/04/2018   LDLCALC 55 12/04/2018   TRIG 178 (H) 12/04/2018   CHOLHDL 2.5 12/04/2018       Depression with anxiety Controlled, no change in medication   Type 2 diabetes mellitus with neurological complications (HCC) Controlled, no change in medication Ms. Graf is reminded of the importance of commitment to daily physical  activity for 30 minutes or more, as able and the need to limit carbohydrate intake to 30 to 60 grams per meal to help with blood sugar control.   The need to take medication as prescribed, test blood sugar as directed, and to call between visits if there is a concern that blood sugar is uncontrolled is also discussed.   Ms. Sendelbach is reminded of the importance of daily foot exam, annual eye examination, and good blood sugar, blood pressure and cholesterol control.  Diabetic Labs Latest Ref Rng & Units 12/04/2018 11/11/2018 11/10/2018 07/03/2018 03/19/2018  HbA1c <5.7 % of total Hgb 7.5(H) - - 8.0(H) -  Microalbumin mg/dL 38.7 - - - -  Micro/Creat Ratio <30 mcg/mg creat 300(H) - - - -  Chol <200 mg/dL 135 113 - 119 -  HDL > OR = 50 mg/dL 55 47 - 49(L) -  Calc LDL mg/dL (calc) 55 46 - 47 -  Triglycerides <150 mg/dL 178(H) 102 - 144 -  Creatinine 0.60 - 0.93 mg/dL 1.12(H) 1.22(H) 1.21(H) 1.45(H) 1.09(H)   BP/Weight 03/19/2019 12/06/2018 12/04/2018 11/12/2018 09/27/2018 08/09/2018 0000000  Systolic BP 0000000 - 0000000 Q000111Q [id number]  Diastolic BP 76 - 76 86 74 62 62  Wt. (Lbs) 239 239 239 224.6 233 230 230.12  BMI 38.58 38.58 37.43 35.18 36.49 36.02 36.04   Foot/eye exam completion dates Latest Ref Rng & Units 08/10/2017 07/14/2016  Eye Exam No Retinopathy - -  Foot exam Order - - -  Foot Form Completion - Done Done      Updated lab needed at/ before next visit.     Follow Up Instructions:    I discussed the assessment and treatment plan with the patient. The patient was provided an opportunity to ask questions and all were answered. The patient agreed with the plan and demonstrated an understanding of the instructions.   The patient was advised to call back or seek an in-person evaluation if the symptoms worsen or if the condition fails to improve as anticipated.  I provided 22 minutes of non-face-to-face time during this encounter.   Tula Nakayama, MD

## 2019-03-25 NOTE — Assessment & Plan Note (Signed)
Controlled, no change in medication [name] is reminded of the importance of commitment to daily physical activity for 30 minutes or more, as able and the need to limit carbohydrate intake to 30 to 60 grams per meal to help with blood sugar control.   The need to take medication as prescribed, test blood sugar as directed, and to call between visits if there is a concern that blood sugar is uncontrolled is also discussed.   [name] is reminded of the importance of daily foot exam, annual eye examination, and good blood sugar, blood pressure and cholesterol control.  Diabetic Labs Latest Ref Rng & Units 12/04/2018 11/11/2018 11/10/2018 07/03/2018 03/19/2018  HbA1c <5.7 % of total Hgb 7.5(H) - - 8.0(H) -  Microalbumin mg/dL 38.7 - - - -  Micro/Creat Ratio <30 mcg/mg creat 300(H) - - - -  Chol <200 mg/dL 135 113 - 119 -  HDL > OR = 50 mg/dL 55 47 - 49(L) -  Calc LDL mg/dL (calc) 55 46 - 47 -  Triglycerides <150 mg/dL 178(H) 102 - 144 -  Creatinine 0.60 - 0.93 mg/dL 1.12(H) 1.22(H) 1.21(H) 1.45(H) 1.09(H)   BP/Weight 03/19/2019 12/06/2018 12/04/2018 11/12/2018 09/27/2018 08/09/2018 0000000  Systolic BP 0000000 - 0000000 Q000111Q [id number]  Diastolic BP 76 - 76 86 74 62 62  Wt. (Lbs) 239 239 239 224.6 233 230 230.12  BMI 38.58 38.58 37.43 35.18 36.49 36.02 36.04   Foot/eye exam completion dates Latest Ref Rng & Units 08/10/2017 07/14/2016  Eye Exam No Retinopathy - -  Foot exam Order - - -  Foot Form Completion - Done Done      Updated lab needed at/ before next visit.

## 2019-03-25 NOTE — Assessment & Plan Note (Signed)
Uncontrolled, needs in office eval for med management  DASH diet and commitment to daily physical activity for a minimum of 30 minutes discussed and encouraged, as a part of hypertension management. The importance of attaining a healthy weight is also discussed.  BP/Weight 03/19/2019 12/06/2018 12/04/2018 11/12/2018 09/27/2018 08/09/2018 0000000  Systolic BP 0000000 - 0000000 Q000111Q [id number]  Diastolic BP 76 - 76 86 74 62 62  Wt. (Lbs) 239 239 239 224.6 233 230 230.12  BMI 38.58 38.58 37.43 35.18 36.49 36.02 36.04

## 2019-03-25 NOTE — Assessment & Plan Note (Signed)
Obesity linked with hypertension and dpression  Patient re-educated about  the importance of commitment to a  minimum of 150 minutes of exercise per week as able.  The importance of healthy food choices with portion control discussed, as well as eating regularly and within a 12 hour window most days. The need to choose "clean , green" food 50 to 75% of the time is discussed, as well as to make water the primary drink and set a goal of 64 ounces water daily.    Weight /BMI 03/19/2019 12/06/2018 12/04/2018  WEIGHT 239 lb 239 lb 239 lb  HEIGHT 5\' 6"  5\' 6"  5\' 7"   BMI 38.58 kg/m2 38.58 kg/m2 37.43 kg/m2

## 2019-03-25 NOTE — Assessment & Plan Note (Addendum)
Hyperlipidemia:Low fat diet discussed and encouraged.  Needs to reduce fat in diet , TG elevated   Lipid Panel  Lab Results  Component Value Date   CHOL 135 12/04/2018   HDL 55 12/04/2018   LDLCALC 55 12/04/2018   TRIG 178 (H) 12/04/2018   CHOLHDL 2.5 12/04/2018

## 2019-03-25 NOTE — Assessment & Plan Note (Signed)
Controlled, no change in medication  

## 2019-03-25 NOTE — Assessment & Plan Note (Signed)
The patient's Controlled Substance registry is reviewed and compliance confirmed. Adequacy of  Pain control and level of function is assessed. Medication dosing is adjusted as deemed appropriate. Twelve weeks of medication is prescribed , patient signs for the script and is provided with a follow up appointment between 11 to 12 weeks .  

## 2019-03-27 ENCOUNTER — Encounter: Payer: Medicare HMO | Admitting: Family Medicine

## 2019-03-29 DIAGNOSIS — J42 Unspecified chronic bronchitis: Secondary | ICD-10-CM | POA: Diagnosis not present

## 2019-03-29 DIAGNOSIS — R0902 Hypoxemia: Secondary | ICD-10-CM | POA: Diagnosis not present

## 2019-04-12 DIAGNOSIS — Z96659 Presence of unspecified artificial knee joint: Secondary | ICD-10-CM | POA: Diagnosis not present

## 2019-04-12 DIAGNOSIS — M1990 Unspecified osteoarthritis, unspecified site: Secondary | ICD-10-CM | POA: Diagnosis not present

## 2019-04-12 DIAGNOSIS — G894 Chronic pain syndrome: Secondary | ICD-10-CM | POA: Diagnosis not present

## 2019-04-18 ENCOUNTER — Encounter: Payer: Medicare HMO | Admitting: Family Medicine

## 2019-04-19 ENCOUNTER — Encounter: Payer: Medicare HMO | Admitting: Family Medicine

## 2019-04-19 ENCOUNTER — Other Ambulatory Visit: Payer: Self-pay | Admitting: *Deleted

## 2019-04-19 DIAGNOSIS — R7301 Impaired fasting glucose: Secondary | ICD-10-CM | POA: Diagnosis not present

## 2019-04-19 DIAGNOSIS — I10 Essential (primary) hypertension: Secondary | ICD-10-CM | POA: Diagnosis not present

## 2019-04-19 MED ORDER — DONEPEZIL HCL 10 MG PO TABS: 10.0000 mg | ORAL_TABLET | Freq: Every day | ORAL | 1 refills | Status: DC

## 2019-04-19 MED ORDER — ACCU-CHEK AVIVA PLUS VI STRP: ORAL_STRIP | 2 refills | Status: DC

## 2019-04-19 MED ORDER — HYDROXYZINE PAMOATE 25 MG PO CAPS: 25.0000 mg | ORAL_CAPSULE | Freq: Three times a day (TID) | ORAL | 0 refills | Status: DC

## 2019-04-19 MED ORDER — ATORVASTATIN CALCIUM 40 MG PO TABS: 40.0000 mg | ORAL_TABLET | Freq: Every day | ORAL | 1 refills | Status: DC

## 2019-04-19 MED ORDER — ACETAMINOPHEN 500 MG PO TABS: ORAL_TABLET | ORAL | 5 refills | Status: DC

## 2019-04-19 MED ORDER — BUSPIRONE HCL 7.5 MG PO TABS: 7.5000 mg | ORAL_TABLET | Freq: Three times a day (TID) | ORAL | 1 refills | Status: DC

## 2019-04-19 MED ORDER — IMIPRAMINE HCL 50 MG PO TABS: ORAL_TABLET | ORAL | 1 refills | Status: DC

## 2019-04-19 MED ORDER — CLOTRIMAZOLE-BETAMETHASONE 1-0.05 % EX CREA: 1.0000 "application " | TOPICAL_CREAM | Freq: Two times a day (BID) | CUTANEOUS | 3 refills | Status: DC

## 2019-04-19 MED ORDER — OMEPRAZOLE 40 MG PO CPDR: 40.0000 mg | DELAYED_RELEASE_CAPSULE | Freq: Two times a day (BID) | ORAL | 1 refills | Status: DC

## 2019-04-19 MED ORDER — TOPIRAMATE 100 MG PO TABS: 100.0000 mg | ORAL_TABLET | Freq: Two times a day (BID) | ORAL | 1 refills | Status: DC

## 2019-04-20 ENCOUNTER — Other Ambulatory Visit: Payer: Self-pay

## 2019-04-20 ENCOUNTER — Encounter (HOSPITAL_COMMUNITY): Payer: Self-pay

## 2019-04-20 ENCOUNTER — Inpatient Hospital Stay (HOSPITAL_COMMUNITY)
Admission: EM | Admit: 2019-04-20 | Discharge: 2019-04-24 | DRG: 378 | Disposition: A | Discharge status: Home / Self Care | Payer: Medicare HMO | Attending: Family Medicine | Admitting: Family Medicine

## 2019-04-20 DIAGNOSIS — T383X5A Adverse effect of insulin and oral hypoglycemic [antidiabetic] drugs, initial encounter: Secondary | ICD-10-CM | POA: Diagnosis present

## 2019-04-20 DIAGNOSIS — E559 Vitamin D deficiency, unspecified: Secondary | ICD-10-CM | POA: Diagnosis present

## 2019-04-20 DIAGNOSIS — G894 Chronic pain syndrome: Secondary | ICD-10-CM | POA: Diagnosis not present

## 2019-04-20 DIAGNOSIS — I10 Essential (primary) hypertension: Secondary | ICD-10-CM | POA: Diagnosis present

## 2019-04-20 DIAGNOSIS — F0390 Unspecified dementia without behavioral disturbance: Secondary | ICD-10-CM | POA: Diagnosis present

## 2019-04-20 DIAGNOSIS — K922 Gastrointestinal hemorrhage, unspecified: Secondary | ICD-10-CM | POA: Diagnosis not present

## 2019-04-20 DIAGNOSIS — E1149 Type 2 diabetes mellitus with other diabetic neurological complication: Secondary | ICD-10-CM | POA: Diagnosis not present

## 2019-04-20 DIAGNOSIS — E1122 Type 2 diabetes mellitus with diabetic chronic kidney disease: Secondary | ICD-10-CM | POA: Diagnosis present

## 2019-04-20 DIAGNOSIS — D509 Iron deficiency anemia, unspecified: Secondary | ICD-10-CM | POA: Diagnosis not present

## 2019-04-20 DIAGNOSIS — Z66 Do not resuscitate: Secondary | ICD-10-CM | POA: Diagnosis present

## 2019-04-20 DIAGNOSIS — E785 Hyperlipidemia, unspecified: Secondary | ICD-10-CM | POA: Diagnosis not present

## 2019-04-20 DIAGNOSIS — Z79899 Other long term (current) drug therapy: Secondary | ICD-10-CM

## 2019-04-20 DIAGNOSIS — K644 Residual hemorrhoidal skin tags: Secondary | ICD-10-CM | POA: Diagnosis not present

## 2019-04-20 DIAGNOSIS — K5903 Drug induced constipation: Secondary | ICD-10-CM | POA: Diagnosis present

## 2019-04-20 DIAGNOSIS — Z7901 Long term (current) use of anticoagulants: Secondary | ICD-10-CM | POA: Diagnosis not present

## 2019-04-20 DIAGNOSIS — M159 Polyosteoarthritis, unspecified: Secondary | ICD-10-CM | POA: Diagnosis not present

## 2019-04-20 DIAGNOSIS — E11649 Type 2 diabetes mellitus with hypoglycemia without coma: Secondary | ICD-10-CM | POA: Diagnosis present

## 2019-04-20 DIAGNOSIS — K449 Diaphragmatic hernia without obstruction or gangrene: Secondary | ICD-10-CM | POA: Diagnosis present

## 2019-04-20 DIAGNOSIS — D179 Benign lipomatous neoplasm, unspecified: Secondary | ICD-10-CM | POA: Diagnosis present

## 2019-04-20 DIAGNOSIS — Z833 Family history of diabetes mellitus: Secondary | ICD-10-CM

## 2019-04-20 DIAGNOSIS — D175 Benign lipomatous neoplasm of intra-abdominal organs: Secondary | ICD-10-CM | POA: Diagnosis not present

## 2019-04-20 DIAGNOSIS — K219 Gastro-esophageal reflux disease without esophagitis: Secondary | ICD-10-CM | POA: Diagnosis present

## 2019-04-20 DIAGNOSIS — T402X5A Adverse effect of other opioids, initial encounter: Secondary | ICD-10-CM | POA: Diagnosis present

## 2019-04-20 DIAGNOSIS — D649 Anemia, unspecified: Secondary | ICD-10-CM

## 2019-04-20 DIAGNOSIS — K5730 Diverticulosis of large intestine without perforation or abscess without bleeding: Secondary | ICD-10-CM | POA: Diagnosis present

## 2019-04-20 DIAGNOSIS — F418 Other specified anxiety disorders: Secondary | ICD-10-CM | POA: Diagnosis present

## 2019-04-20 DIAGNOSIS — D62 Acute posthemorrhagic anemia: Secondary | ICD-10-CM | POA: Diagnosis present

## 2019-04-20 DIAGNOSIS — Z96652 Presence of left artificial knee joint: Secondary | ICD-10-CM | POA: Diagnosis present

## 2019-04-20 DIAGNOSIS — R1310 Dysphagia, unspecified: Secondary | ICD-10-CM | POA: Diagnosis not present

## 2019-04-20 DIAGNOSIS — I129 Hypertensive chronic kidney disease with stage 1 through stage 4 chronic kidney disease, or unspecified chronic kidney disease: Secondary | ICD-10-CM | POA: Diagnosis present

## 2019-04-20 DIAGNOSIS — E8881 Metabolic syndrome: Secondary | ICD-10-CM | POA: Diagnosis present

## 2019-04-20 DIAGNOSIS — N1832 Chronic kidney disease, stage 3b: Secondary | ICD-10-CM | POA: Diagnosis not present

## 2019-04-20 DIAGNOSIS — I482 Chronic atrial fibrillation, unspecified: Secondary | ICD-10-CM | POA: Diagnosis present

## 2019-04-20 DIAGNOSIS — Z86711 Personal history of pulmonary embolism: Secondary | ICD-10-CM | POA: Diagnosis not present

## 2019-04-20 DIAGNOSIS — J42 Unspecified chronic bronchitis: Secondary | ICD-10-CM | POA: Diagnosis present

## 2019-04-20 DIAGNOSIS — D500 Iron deficiency anemia secondary to blood loss (chronic): Secondary | ICD-10-CM | POA: Diagnosis not present

## 2019-04-20 DIAGNOSIS — K6289 Other specified diseases of anus and rectum: Secondary | ICD-10-CM | POA: Diagnosis not present

## 2019-04-20 DIAGNOSIS — R413 Other amnesia: Secondary | ICD-10-CM | POA: Diagnosis present

## 2019-04-20 DIAGNOSIS — Z20822 Contact with and (suspected) exposure to covid-19: Secondary | ICD-10-CM | POA: Diagnosis present

## 2019-04-20 DIAGNOSIS — K3189 Other diseases of stomach and duodenum: Secondary | ICD-10-CM | POA: Diagnosis not present

## 2019-04-20 LAB — RETICULOCYTES
Immature Retic Fract: 29.4 % — ABNORMAL HIGH (ref 2.3–15.9)
RBC.: 3.68 MIL/uL — ABNORMAL LOW (ref 3.87–5.11)
Retic Count, Absolute: 51.2 10*3/uL (ref 19.0–186.0)
Retic Ct Pct: 1.4 % (ref 0.4–3.1)

## 2019-04-20 LAB — CBC WITH DIFFERENTIAL/PLATELET
Abs Immature Granulocytes: 0.02 10*3/uL (ref 0.00–0.07)
Basophils Absolute: 0 10*3/uL (ref 0.0–0.1)
Basophils Relative: 0 %
Eosinophils Absolute: 0 10*3/uL (ref 0.0–0.5)
Eosinophils Relative: 0 %
HCT: 26.3 % — ABNORMAL LOW (ref 36.0–46.0)
Hemoglobin: 6.8 g/dL — CL (ref 12.0–15.0)
Immature Granulocytes: 0 %
Lymphocytes Relative: 17 %
Lymphs Abs: 1.6 10*3/uL (ref 0.7–4.0)
MCH: 17.6 pg — ABNORMAL LOW (ref 26.0–34.0)
MCHC: 25.9 g/dL — ABNORMAL LOW (ref 30.0–36.0)
MCV: 68 fL — ABNORMAL LOW (ref 80.0–100.0)
Monocytes Absolute: 0.5 10*3/uL (ref 0.1–1.0)
Monocytes Relative: 6 %
Neutro Abs: 7.4 10*3/uL (ref 1.7–7.7)
Neutrophils Relative %: 77 %
Platelets: 475 10*3/uL — ABNORMAL HIGH (ref 150–400)
RBC: 3.87 MIL/uL (ref 3.87–5.11)
RDW: 21.2 % — ABNORMAL HIGH (ref 11.5–15.5)
WBC: 9.6 10*3/uL (ref 4.0–10.5)
nRBC: 0 % (ref 0.0–0.2)

## 2019-04-20 LAB — BASIC METABOLIC PANEL WITH GFR
BUN/Creatinine Ratio: 15 (calc) (ref 6–22)
BUN: 17 mg/dL (ref 7–25)
CO2: 22 mmol/L (ref 20–32)
Calcium: 9.3 mg/dL (ref 8.6–10.4)
Chloride: 103 mmol/L (ref 98–110)
Creat: 1.12 mg/dL — ABNORMAL HIGH (ref 0.60–0.93)
GFR, Est African American: 54 mL/min/{1.73_m2} — ABNORMAL LOW (ref >=60)
GFR, Est Non African American: 47 mL/min/{1.73_m2} — ABNORMAL LOW (ref >=60)
Glucose, Bld: 167 mg/dL — ABNORMAL HIGH (ref 65–99)
Potassium: 4 mmol/L (ref 3.5–5.3)
Sodium: 135 mmol/L (ref 135–146)

## 2019-04-20 LAB — CBC
HCT: 25.5 % — ABNORMAL LOW (ref 35.0–45.0)
Hemoglobin: 7 g/dL — ABNORMAL LOW (ref 11.7–15.5)
MCH: 17.2 pg — ABNORMAL LOW (ref 27.0–33.0)
MCHC: 27.5 g/dL — ABNORMAL LOW (ref 32.0–36.0)
MCV: 62.5 fL — ABNORMAL LOW (ref 80.0–100.0)
MPV: 8.7 fL (ref 7.5–12.5)
Platelets: 493 10*3/uL — ABNORMAL HIGH (ref 140–400)
RBC: 4.08 10*6/uL (ref 3.80–5.10)
RDW: 20.3 % — ABNORMAL HIGH (ref 11.0–15.0)
WBC: 9.4 10*3/uL (ref 3.8–10.8)

## 2019-04-20 LAB — HEMOGLOBIN A1C
Hgb A1c MFr Bld: 8.1 % of total Hgb — ABNORMAL HIGH (ref <5.7)
Mean Plasma Glucose: 186 (calc)
eAG (mmol/L): 10.3 (calc)

## 2019-04-20 LAB — COMPREHENSIVE METABOLIC PANEL
ALT: 13 U/L (ref 0–44)
AST: 14 U/L — ABNORMAL LOW (ref 15–41)
Albumin: 3.7 g/dL (ref 3.5–5.0)
Alkaline Phosphatase: 101 U/L (ref 38–126)
Anion gap: 8 (ref 5–15)
BUN: 17 mg/dL (ref 8–23)
CO2: 22 mmol/L (ref 22–32)
Calcium: 8.9 mg/dL (ref 8.9–10.3)
Chloride: 105 mmol/L (ref 98–111)
Creatinine, Ser: 1.34 mg/dL — ABNORMAL HIGH (ref 0.44–1.00)
GFR calc Af Amer: 44 mL/min — ABNORMAL LOW (ref >60)
GFR calc non Af Amer: 38 mL/min — ABNORMAL LOW (ref >60)
Glucose, Bld: 144 mg/dL — ABNORMAL HIGH (ref 70–99)
Potassium: 3.7 mmol/L (ref 3.5–5.1)
Sodium: 135 mmol/L (ref 135–145)
Total Bilirubin: 0.4 mg/dL (ref 0.3–1.2)
Total Protein: 7.7 g/dL (ref 6.5–8.1)

## 2019-04-20 LAB — IRON AND TIBC
Iron: 7 ug/dL — ABNORMAL LOW (ref 28–170)
Saturation Ratios: 2 % — ABNORMAL LOW (ref 10.4–31.8)
TIBC: 404 ug/dL (ref 250–450)
UIBC: 397 ug/dL

## 2019-04-20 LAB — VITAMIN D 25 HYDROXY (VIT D DEFICIENCY, FRACTURES): Vit D, 25-Hydroxy: 31.74 ng/mL (ref 30–100)

## 2019-04-20 LAB — GLUCOSE, CAPILLARY: Glucose-Capillary: 99 mg/dL (ref 70–99)

## 2019-04-20 LAB — POC OCCULT BLOOD, ED: Fecal Occult Bld: POSITIVE — AB

## 2019-04-20 LAB — PROTIME-INR
INR: 1.3 — ABNORMAL HIGH (ref 0.8–1.2)
Prothrombin Time: 16 seconds — ABNORMAL HIGH (ref 11.4–15.2)

## 2019-04-20 LAB — FERRITIN: Ferritin: 5 ng/mL — ABNORMAL LOW (ref 11–307)

## 2019-04-20 LAB — VITAMIN B12: Vitamin B-12: 331 pg/mL (ref 180–914)

## 2019-04-20 LAB — FOLATE: Folate: 19.7 ng/mL (ref >5.9)

## 2019-04-20 LAB — PREPARE RBC (CROSSMATCH)

## 2019-04-20 LAB — ABO/RH: ABO/RH(D): AB POS

## 2019-04-20 MED ORDER — OLOPATADINE HCL 0.1 % OP SOLN
1.0000 [drp] | Freq: Two times a day (BID) | OPHTHALMIC | Status: DC | PRN
  Administered 2019-04-21: 1 [drp] via OPHTHALMIC
  Filled 2019-04-20: qty 5

## 2019-04-20 MED ORDER — IMIPRAMINE HCL 25 MG PO TABS
100.0000 mg | ORAL_TABLET | Freq: Every day | ORAL | Status: DC
  Administered 2019-04-20 – 2019-04-23 (×4): 100 mg via ORAL
  Filled 2019-04-20 (×2): qty 4
  Filled 2019-04-20: qty 2
  Filled 2019-04-20: qty 4
  Filled 2019-04-20 (×2): qty 2
  Filled 2019-04-20: qty 4

## 2019-04-20 MED ORDER — DONEPEZIL HCL 5 MG PO TABS
10.0000 mg | ORAL_TABLET | Freq: Every day | ORAL | Status: DC
  Administered 2019-04-20 – 2019-04-23 (×4): 10 mg via ORAL
  Filled 2019-04-20 (×7): qty 2

## 2019-04-20 MED ORDER — ACETAMINOPHEN 325 MG PO TABS
650.0000 mg | ORAL_TABLET | Freq: Four times a day (QID) | ORAL | Status: DC | PRN
  Administered 2019-04-21 – 2019-04-23 (×2): 650 mg via ORAL
  Filled 2019-04-20 (×2): qty 2

## 2019-04-20 MED ORDER — TOPIRAMATE 100 MG PO TABS
100.0000 mg | ORAL_TABLET | Freq: Two times a day (BID) | ORAL | Status: DC
  Administered 2019-04-20 – 2019-04-24 (×8): 100 mg via ORAL
  Filled 2019-04-20 (×8): qty 1

## 2019-04-20 MED ORDER — SODIUM CHLORIDE 0.9% IV SOLUTION: Freq: Once | INTRAVENOUS | Status: AC

## 2019-04-20 MED ORDER — SODIUM CHLORIDE 0.9 % IV BOLUS
500.0000 mL | Freq: Once | INTRAVENOUS | Status: AC
  Administered 2019-04-20: 500 mL via INTRAVENOUS

## 2019-04-20 MED ORDER — COLCHICINE 0.6 MG PO TABS: 0.6000 mg | ORAL_TABLET | Freq: Every day | ORAL | Status: DC | PRN

## 2019-04-20 MED ORDER — HYDROXYZINE PAMOATE 25 MG PO CAPS
25.0000 mg | ORAL_CAPSULE | Freq: Three times a day (TID) | ORAL | Status: DC | PRN
  Filled 2019-04-20: qty 1

## 2019-04-20 MED ORDER — INSULIN ASPART PROT & ASPART (70-30 MIX) 100 UNIT/ML ~~LOC~~ SUSP
25.0000 [IU] | Freq: Two times a day (BID) | SUBCUTANEOUS | Status: DC
  Filled 2019-04-20: qty 10

## 2019-04-20 MED ORDER — TIMOLOL MALEATE 0.5 % OP SOLN
1.0000 [drp] | Freq: Two times a day (BID) | OPHTHALMIC | Status: DC
  Administered 2019-04-20 – 2019-04-24 (×8): 1 [drp] via OPHTHALMIC
  Filled 2019-04-20: qty 5

## 2019-04-20 MED ORDER — ATORVASTATIN CALCIUM 40 MG PO TABS
40.0000 mg | ORAL_TABLET | Freq: Every day | ORAL | Status: DC
  Administered 2019-04-21 – 2019-04-24 (×4): 40 mg via ORAL
  Filled 2019-04-20 (×3): qty 1
  Filled 2019-04-20: qty 2

## 2019-04-20 MED ORDER — SODIUM CHLORIDE 0.9% IV SOLUTION: Freq: Once | INTRAVENOUS | Status: DC

## 2019-04-20 MED ORDER — HYDROXYZINE HCL 25 MG PO TABS
25.0000 mg | ORAL_TABLET | Freq: Three times a day (TID) | ORAL | Status: DC | PRN
  Administered 2019-04-21: 25 mg via ORAL
  Filled 2019-04-20 (×2): qty 1

## 2019-04-20 MED ORDER — BRIMONIDINE TARTRATE-TIMOLOL 0.2-0.5 % OP SOLN: 1.0000 [drp] | Freq: Two times a day (BID) | OPHTHALMIC | Status: DC

## 2019-04-20 MED ORDER — CLONIDINE HCL 0.2 MG PO TABS
0.2000 mg | ORAL_TABLET | Freq: Every day | ORAL | Status: DC
  Administered 2019-04-20 – 2019-04-23 (×4): 0.2 mg via ORAL
  Filled 2019-04-20 (×4): qty 1

## 2019-04-20 MED ORDER — INSULIN ASPART 100 UNIT/ML ~~LOC~~ SOLN
0.0000 [IU] | Freq: Three times a day (TID) | SUBCUTANEOUS | Status: DC
  Administered 2019-04-21: 1 [IU] via SUBCUTANEOUS
  Administered 2019-04-21 – 2019-04-22 (×2): 2 [IU] via SUBCUTANEOUS
  Administered 2019-04-23 – 2019-04-24 (×4): 1 [IU] via SUBCUTANEOUS
  Administered 2019-04-24 (×2): 2 [IU] via SUBCUTANEOUS

## 2019-04-20 MED ORDER — PANTOPRAZOLE SODIUM 40 MG IV SOLR
40.0000 mg | Freq: Two times a day (BID) | INTRAVENOUS | Status: DC
  Administered 2019-04-20 – 2019-04-24 (×8): 40 mg via INTRAVENOUS
  Filled 2019-04-20 (×8): qty 40

## 2019-04-20 MED ORDER — INSULIN ASPART 100 UNIT/ML ~~LOC~~ SOLN: 0.0000 [IU] | Freq: Every day | SUBCUTANEOUS | Status: DC

## 2019-04-20 MED ORDER — SODIUM CHLORIDE 0.9% FLUSH
3.0000 mL | Freq: Two times a day (BID) | INTRAVENOUS | Status: DC
  Administered 2019-04-20 – 2019-04-24 (×8): 3 mL via INTRAVENOUS

## 2019-04-20 MED ORDER — FLUOXETINE HCL 10 MG PO CAPS
10.0000 mg | ORAL_CAPSULE | Freq: Every day | ORAL | Status: DC
  Administered 2019-04-21 – 2019-04-24 (×4): 10 mg via ORAL
  Filled 2019-04-20 (×4): qty 1

## 2019-04-20 MED ORDER — SODIUM CHLORIDE 0.9 % IV SOLN: 250.0000 mL | INTRAVENOUS | Status: DC | PRN

## 2019-04-20 MED ORDER — ACETAMINOPHEN 650 MG RE SUPP: 650.0000 mg | Freq: Four times a day (QID) | RECTAL | Status: DC | PRN

## 2019-04-20 MED ORDER — OXYCODONE HCL 5 MG PO TABS
5.0000 mg | ORAL_TABLET | Freq: Two times a day (BID) | ORAL | Status: DC | PRN
  Administered 2019-04-23: 5 mg via ORAL
  Filled 2019-04-20: qty 1

## 2019-04-20 MED ORDER — POLYETHYLENE GLYCOL 3350 17 G PO PACK: 17.0000 g | PACK | Freq: Every day | ORAL | Status: DC | PRN

## 2019-04-20 MED ORDER — ONDANSETRON HCL 4 MG PO TABS
4.0000 mg | ORAL_TABLET | Freq: Four times a day (QID) | ORAL | Status: DC | PRN
  Administered 2019-04-23: 4 mg via ORAL
  Filled 2019-04-20: qty 1

## 2019-04-20 MED ORDER — DOCUSATE SODIUM 100 MG PO CAPS
100.0000 mg | ORAL_CAPSULE | Freq: Every day | ORAL | Status: DC
  Administered 2019-04-21 – 2019-04-24 (×4): 100 mg via ORAL
  Filled 2019-04-20 (×4): qty 1

## 2019-04-20 MED ORDER — SODIUM CHLORIDE 0.9% FLUSH: 3.0000 mL | INTRAVENOUS | Status: DC | PRN

## 2019-04-20 MED ORDER — MONTELUKAST SODIUM 10 MG PO TABS
10.0000 mg | ORAL_TABLET | Freq: Every day | ORAL | Status: DC
  Administered 2019-04-20 – 2019-04-23 (×4): 10 mg via ORAL
  Filled 2019-04-20 (×4): qty 1

## 2019-04-20 MED ORDER — ALBUTEROL SULFATE (2.5 MG/3ML) 0.083% IN NEBU: 3.0000 mL | INHALATION_SOLUTION | Freq: Four times a day (QID) | RESPIRATORY_TRACT | Status: DC | PRN

## 2019-04-20 MED ORDER — ONDANSETRON HCL 4 MG/2ML IJ SOLN: 4.0000 mg | Freq: Four times a day (QID) | INTRAMUSCULAR | Status: DC | PRN

## 2019-04-20 MED ORDER — BRIMONIDINE TARTRATE 0.2 % OP SOLN
1.0000 [drp] | Freq: Two times a day (BID) | OPHTHALMIC | Status: DC
  Administered 2019-04-20 – 2019-04-24 (×8): 1 [drp] via OPHTHALMIC
  Filled 2019-04-20: qty 5

## 2019-04-20 MED ORDER — OXYCODONE-ACETAMINOPHEN 10-325 MG PO TABS: 1.0000 | ORAL_TABLET | Freq: Two times a day (BID) | ORAL | Status: DC | PRN

## 2019-04-20 MED ORDER — AMLODIPINE BESYLATE 5 MG PO TABS
10.0000 mg | ORAL_TABLET | Freq: Every day | ORAL | Status: DC
  Administered 2019-04-21 – 2019-04-24 (×4): 10 mg via ORAL
  Filled 2019-04-20 (×4): qty 2

## 2019-04-20 MED ORDER — PANTOPRAZOLE SODIUM 40 MG IV SOLR
40.0000 mg | Freq: Once | INTRAVENOUS | Status: AC
  Administered 2019-04-20: 40 mg via INTRAVENOUS
  Filled 2019-04-20: qty 40

## 2019-04-20 MED ORDER — OXYCODONE-ACETAMINOPHEN 5-325 MG PO TABS
1.0000 | ORAL_TABLET | Freq: Two times a day (BID) | ORAL | Status: DC | PRN
  Administered 2019-04-20 – 2019-04-24 (×5): 1 via ORAL
  Filled 2019-04-20 (×5): qty 1

## 2019-04-20 MED ORDER — BUSPIRONE HCL 5 MG PO TABS
7.5000 mg | ORAL_TABLET | Freq: Three times a day (TID) | ORAL | Status: DC
  Administered 2019-04-20 – 2019-04-24 (×11): 7.5 mg via ORAL
  Filled 2019-04-20 (×12): qty 2

## 2019-04-20 NOTE — Plan of Care (Signed)
  Problem: Education: Goal: Knowledge of General Education information will improve Description: Including pain rating scale, medication(s)/side effects and non-pharmacologic comfort measures Outcome: Progressing   Problem: Clinical Measurements: Goal: Ability to maintain clinical measurements within normal limits will improve Outcome: Progressing Goal: Respiratory complications will improve Outcome: Progressing   Problem: Activity: Goal: Risk for activity intolerance will decrease Outcome: Progressing   Problem: Nutrition: Goal: Adequate nutrition will be maintained Outcome: Progressing   Problem: Elimination: Goal: Will not experience complications related to urinary retention Outcome: Progressing   Problem: Pain Managment: Goal: General experience of comfort will improve Outcome: Progressing   Problem: Safety: Goal: Ability to remain free from injury will improve Outcome: Progressing   Problem: Skin Integrity: Goal: Risk for impaired skin integrity will decrease Outcome: Progressing

## 2019-04-20 NOTE — ED Provider Notes (Addendum)
[facility] Provider Note   CSN: [id number] Arrival date & time: 04/20/19  1111     History Chief Complaint  Patient presents with  . Abnormal Lab    [name] is a 79 y.o. female.  Chief complaint low hemoglobin.  Patient has been feeling weak.  She went to her primary care dr yesterday and blood work was obtained.  Family got a call today that her hemoglobin was low.  Previous history of iron deficiency anemia.  No black stool.  Patient takes Pradaxa.  No chest pain, dyspnea, cough, fever, sweats, chills.  There is moderate.        Past Medical History:  Diagnosis Date  . Anemia, iron deficiency 2012   Evaluated by Dr. Oneida Alar; H&H of 9.3/30.8 with [place] in 10/2010; 3/3 positive Hemoccult cards in 07/2011  . Anxiety    was on Prozac for a couple of weeks  . Atrial fibrillation ([place])    takes Coumadin daily  . Bruises easily    takes Coumadin  . Chronic anticoagulation 07/21/2010  . Chronic back pain    related to knee pain  . Degenerative joint disease    Knees  . Dementia ([place])   . Depression   . Diabetes mellitus    takes Metformin daily  . Gastroesophageal reflux disease    takes Omeprazole daily  . Glaucoma   . Glaucoma    uses eye drops at night  . History of blood transfusion 1969  . History of gout    was on medication but taken off of several months ago  . Hx of colonic polyps   . Hx of migraines    last one about a yr ago;takes Topamax daily  . Hyperlipidemia    takes Pravastatin daily  . Hypertension    takes Hyzaar daily and Propranolol   . Insomnia    takes Restoril prn  . Joint pain   . Joint swelling   . Memory loss    takes donepezil  . Migraine   . Migraine   . Nocturia   . Overweight(278.02)   . Pneumonia 1969   hx of  . Pulmonary embolism [facility]) May 2012   acute presentation, bilateral PE  . Skin spots-aging   . Urinary frequency     Patient Active Problem List   Diagnosis Date Noted  . Esophageal  dysphagia 01/26/2019  . Candidiasis of vulva and vagina 12/08/2018  . Chest pain 11/10/2018  . 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 History of pulmonary embolism 01/25/2018  . Encounter for chronic pain management 07/17/2016  . Chronic pain syndrome 02/03/2016  . Chronic bronchitis ([place]) 10/31/2014  . Bilateral knee pain 04/23/2014  . Type 2 diabetes mellitus with neurological complications ([place]) 66/44/0347  . Right-sided low back pain with right-sided sciatica 07/02/2013  . Seasonal allergies 04/11/2013  . Urinary incontinence 04/11/2013  . Metabolic syndrome X 42/59/5638  . Chronic anticoagulation 12/22/2012  . Vitamin D deficiency 11/05/2012  . Memory loss 11/01/2012  . Depression with anxiety 11/01/2012  . Anemia, iron deficiency   . Morbid obesity ([place]) 08/16/2008  . Globus pharyngeus 01/26/2008  . Dyslipidemia 05/30/2007  . Migraine 05/30/2007  . Essential hypertension 05/30/2007  . Gastroesophageal reflux disease 05/30/2007    Past Surgical History:  Procedure Laterality Date  . CATARACT EXTRACTION W/PHACO Right 04/11/2012   Procedure: CATARACT EXTRACTION PHACO AND INTRAOCULAR LENS PLACEMENT (IOC);  Surgeon: Elta Guadeloupe T. Gershon Crane, MD;  Location: [facility];  Service: Ophthalmology;  Laterality: Right;  CDE=9.61  . CATARACT EXTRACTION W/PHACO Left 12/26/2012   Procedure: CATARACT EXTRACTION PHACO AND INTRAOCULAR LENS PLACEMENT (IOC);  Surgeon: Elta Guadeloupe T. Gershon Crane, MD;  Location: [facility];  Service: Ophthalmology;  Laterality: Left;  CDE:7.74  . COLONOSCOPY  Jan 2002; 2012   2002: Dr. Tamala Julian, ext. hemorrhoids, nl colon; 2012-adenomatous polyps, gastritis on EGD  . CYSTOSCOPY W/ URETERAL STENT PLACEMENT Bilateral 02/25/2018   Procedure: CYSTOSCOPY WITH BILATERAL RETROGRADE PYELOGRAM/BILATERAL URETERAL STENT PLACEMENT, BLADDER BIOPSIES WITH FULGERATION;  Surgeon: [name], MD;  Location: [facility];  Service: Urology;  Laterality: Bilateral;  . CYSTOSCOPY/URETEROSCOPY/HOLMIUM  LASER/STENT PLACEMENT Bilateral 03/21/2018   Procedure: CYSTOSCOPY/URETEROSCOPY/HOLMIUM LASER/STENT PLACEMENT;  Surgeon: [name], MD;  Location: [facility];  Service: Urology;  Laterality: Bilateral;  ONLY NEEDS 60 MIN  . DILATION AND CURETTAGE OF UTERUS    . ESOPHAGOGASTRODUODENOSCOPY  08/24/2011   ESOPHAGOGASTRODUODENOSCOPY; esophageal dilatation; Rogene Houston, MD;  . Freda Munro CAPSULE STUDY  08/18/2011   Procedure: GIVENS CAPSULE STUDY;  Surgeon: Rogene Houston, MD;  Location: [facility];  Service: Endoscopy;  Laterality: N/A;  730  . KNEE ARTHROSCOPY W/ MENISCECTOMY  90's   Left  . ORIF ANKLE FRACTURE Right 90's  . TONSILLECTOMY    . TOTAL KNEE ARTHROPLASTY  10/20/2011   Procedure: TOTAL KNEE ARTHROPLASTY;  Surgeon: Ninetta Lights, MD;  Location: [facility];  Service: Orthopedics;  Laterality: Left;  . UPPER GASTROINTESTINAL ENDOSCOPY    . URETHRAL DILATION       OB History   No obstetric history on file.     Family History  Problem Relation Age of Onset  . Diabetes Mother   . Hypertension Mother   . Arthritis Mother   . Heart failure Mother   . Migraines Mother   . Kidney failure Mother   . Leukemia Father   . Migraines Father   . Kidney failure Father   . Diabetes Sister   . Hypertension Sister   . Diabetes Brother   . Hypertension Brother   . Hypertension Brother   . Hypertension Brother   . Hypertension Brother   . Diabetes Brother   . Diabetes Brother   . Diabetes Brother   . Pulmonary embolism Sister   . Migraines Sister   . Kidney failure Brother   . Colon cancer Neg Hx   . Colon polyps Neg Hx     Social History   Tobacco Use  . Smoking status: Never Smoker  . Smokeless tobacco: Never Used  Substance Use Topics  . Alcohol use: No  . Drug use: No    Home Medications Prior to Admission medications   Medication Sig Start Date End Date Taking? Authorizing Provider  acetaminophen (TYLENOL) 500 MG tablet Take one tablet two times  daily for chronic pain 04/19/19   [name], MD  albuterol (VENTOLIN HFA) 108 (90 Base) MCG/ACT inhaler Inhale 2 puffs into the lungs every 6 (six) hours as needed for wheezing or shortness of breath. 02/27/18   Roxan Hockey, MD  amLODipine (NORVASC) 10 MG tablet Take 1 tablet (10 mg total) by mouth daily. 02/27/18   Roxan Hockey, MD  atorvastatin (LIPITOR) 40 MG tablet Take 1 tablet (40 mg total) by mouth daily. 04/19/19   [name], MD  Blood Glucose Monitoring Suppl (ACCU-CHEK AVIVA PLUS) w/Device KIT For once daily testing dx E11.9 11/24/15   [name], MD  busPIRone (BUSPAR) 7.5 MG tablet Take 1 tablet (7.5 mg total) by  mouth 3 (three) times daily. 04/19/19   [name], MD  cloNIDine (CATAPRES) 0.2 MG tablet Take 1 tablet (0.2 mg total) by mouth at bedtime. 04/10/18   [name], MD  clotrimazole-betamethasone (LOTRISONE) cream Apply 1 application topically 2 (two) times daily. D/c vusion ointment 04/19/19   [name], MD  colchicine 0.6 MG tablet Take 1 tablet (0.6 mg total) by mouth daily. May decrease to once per day as needed for diarrhea 06/27/17   Tanna Furry, MD  COMBIGAN 0.2-0.5 % ophthalmic solution Place 1 drop into both eyes 2 (two) times daily. 03/23/17   [provider]  conjugated estrogens (PREMARIN) vaginal cream Place vaginally 2 (two) times a week. Apply sparingly ( fingertip)  Twice weekly to vaginal area on Wednesdays and Fridays 01/30/18   Irwin Brakeman L, MD  docusate sodium (COLACE) 100 MG capsule Take 1 capsule (100 mg total) by mouth daily as needed for mild constipation. 03/20/18   Hosie Poisson, MD  donepezil (ARICEPT) 10 MG tablet Take 1 tablet (10 mg total) by mouth at bedtime. 04/19/19   [name], MD  Fluoxetine HCl, PMDD, 10 MG TABS Take 1 tablet (10 mg total) by mouth daily. 01/27/18   Johnson, Clanford L, MD  glucose blood (ACCU-CHEK AVIVA PLUS) test strip USE TO TEST BLOOD SUAGR 2 TIMES A DAY  04/19/19   [name], MD  hydrOXYzine (VISTARIL) 25 MG capsule Take 1 capsule (25 mg total) by mouth 3 (three) times daily. 04/19/19   [name], MD  imipramine (TOFRANIL) 50 MG tablet TAKE 2 TABLETS (100 MG TOTAL) BY MOUTH AT BEDTIME. 04/19/19   [name], MD  insulin NPH-regular Human (NOVOLIN 70/30) (70-30) 100 UNIT/ML injection Inject 40 Units into the skin 2 (two) times daily with a meal. 09/27/18   [name], MD  Insulin Syringe-Needle U-100 (BD VEO INSULIN SYRINGE U/F) 31G X 15/64" 0.5 ML MISC Use to inject Novolin twice daily 08/17/18   [name], MD  meclizine (ANTIVERT) 25 MG tablet TAKE 1 TABLET THREE TIMES DAILY AS NEEDED FOR DIZZINESS. 08/07/18   [name], MD  montelukast (SINGULAIR) 10 MG tablet TAKE 1 TABLET BY MOUTH EVERYDAY AT BEDTIME 10/22/18   Perlie Mayo, NP  nystatin (MYCOSTATIN/NYSTOP) powder Apply topically daily as needed (for rash under breasts). 12/04/18   [name], MD  olopatadine (PATANOL) 0.1 % ophthalmic solution Place 1 drop into both eyes 2 (two) times daily. 01/27/18   Johnson, Clanford L, MD  omeprazole (PRILOSEC) 40 MG capsule Take 1 capsule (40 mg total) by mouth 2 (two) times daily. 04/19/19   [name], MD  oxyCODONE-acetaminophen (PERCOCET) 10-325 MG tablet Take one tablet by mouth two times daily for pain 02/21/19   [name], MD  oxyCODONE-acetaminophen [facility]) 10-325 MG tablet Take one tablet by mouth two times  Daily for back pain 03/22/19 04/21/19  [name], MD  oxyCODONE-acetaminophen (PERCOCET) 10-325 MG tablet Take one tablet by mouth two times daily for back pain 04/21/19 05/21/19  [name], MD  oxyCODONE-acetaminophen (PERCOCET) 10-325 MG tablet Take one tablet by mouht two times daily for back pain 05/21/19 06/20/19  [name], MD  polyethylene glycol (MIRALAX / Floria Raveling) packet Take 17 g by mouth daily. 03/21/18   Hosie Poisson, MD  PRADAXA 150 MG CAPS  capsule TAKE 1 CAPSULE BY MOUTH EVERY 12 (TWELVE) HOURS. 02/28/19   Branch, Alphonse Guild, MD  SUMAtriptan (  IMITREX) 25 MG tablet Take 1 tablet (25 mg total) by mouth every 2 (two) hours as needed for migraine. May repeat in 2 hours if headache persists or recurs. 12/04/18   [name], MD  SUMAtriptan (IMITREX) 5 MG/ACT nasal spray Place 1 spray into the nose every 2 (two) hours as needed for migraine.  01/16/17   [provider]  temazepam (RESTORIL) 30 MG capsule Take 1 capsule (30 mg total) by mouth at bedtime as needed for sleep. 12/04/18   [name], MD  topiramate (TOPAMAX) 100 MG tablet Take 1 tablet (100 mg total) by mouth 2 (two) times daily. 04/19/19   [name], MD  Vitamin D, Ergocalciferol, (DRISDOL) 50000 units CAPS capsule TAKE 1 CAPSULE (50,000 UNITS TOTAL) BY MOUTH ONCE A WEEK. Patient taking differently: Take 50,000 Units by mouth every 7 (seven) days. Saturday 11/18/17   [name], MD    Allergies    Penicillins, Fentanyl, Sulfonamide derivatives, and Codeine  Review of Systems   Review of Systems  All other systems reviewed and are negative.   Physical Exam Updated Vital Signs BP (!) 167/60   Pulse 88   Temp 98.2 F (36.8 C) (Oral)   Resp 16   Ht 5' 7"  (1.702 m)   Wt 106.6 kg   SpO2 97%   BMI 36.81 kg/m   Physical Exam Vitals and nursing note reviewed.  Constitutional:      Appearance: She is well-developed.     Comments: Elevated BMI.  HENT:     Head: Normocephalic and atraumatic.  Eyes:     Conjunctiva/sclera: Conjunctivae normal.  Cardiovascular:     Rate and Rhythm: Normal rate and regular rhythm.  Pulmonary:     Effort: Pulmonary effort is normal.     Breath sounds: Normal breath sounds.  Abdominal:     General: Bowel sounds are normal.     Palpations: Abdomen is soft.  Genitourinary:    Comments: Rectal exam: No masses, black stool, heme positive Musculoskeletal:        General: Normal range of  motion.     Cervical back: Neck supple.  Skin:    General: Skin is warm and dry.  Neurological:     General: No focal deficit present.     Mental Status: She is alert and oriented to person, place, and time.  Psychiatric:        Behavior: Behavior normal.     ED Results / Procedures / Treatments   Labs (all labs ordered are listed, but only abnormal results are displayed) Labs Reviewed  CBC WITH DIFFERENTIAL/PLATELET - Abnormal; Notable for the following components:      Result Value   Hemoglobin 6.8 (*)    HCT 26.3 (*)    MCV 68.0 (*)    MCH 17.6 (*)    MCHC 25.9 (*)    RDW 21.2 (*)    Platelets 475 (*)    All other components within normal limits  COMPREHENSIVE METABOLIC PANEL - Abnormal; Notable for the following components:   Glucose, Bld 144 (*)    Creatinine, Ser 1.34 (*)    AST 14 (*)    GFR calc non Af Amer 38 (*)    GFR calc Af Amer 44 (*)    All other components within normal limits  PROTIME-INR - Abnormal; Notable for the following components:   Prothrombin Time 16.0 (*)    INR 1.3 (*)    All other components within normal  limits  POC OCCULT BLOOD, ED - Abnormal; Notable for the following components:   Fecal Occult Bld POSITIVE (*)    All other components within normal limits  TYPE AND SCREEN  ABO/RH  PREPARE RBC (CROSSMATCH)    EKG None  Radiology No results found.  Procedures Procedures (including critical care time)  Medications Ordered in ED Medications  pantoprazole (PROTONIX) injection 40 mg (has no administration in time range)  0.9 %  sodium chloride infusion (Manually program via Guardrails IV Fluids) (has no administration in time range)  0.9 %  sodium chloride infusion (Manually program via Guardrails IV Fluids) (has no administration in time range)  sodium chloride 0.9 % bolus 500 mL (0 mLs Intravenous Stopped 04/20/19 1443)    ED Course  I have reviewed the triage vital signs and the nursing notes.  Pertinent labs & imaging  results that were available during my care of the patient were reviewed by me and considered in my medical decision making (see chart for details).    MDM Rules/Calculators/A&P                      Patient is hemodynamically stable.  Will repeat labs and reassess.  1440: Recheck rectal exam heme positive.  Hemoglobin 6.8 (hemoglobin 7.9 on 11/11/2018).  Will transfuse 1 unit of blood and admit to general medicine.  CRITICAL CARE Performed by: Nat Christen Total critical care time: 30 minutes Critical care time was exclusive of separately billable procedures and treating other patients. Critical care was necessary to treat or prevent imminent or life-threatening deterioration. Critical care was time spent personally by me on the following activities: development of treatment plan with patient and/or surrogate as well as nursing, discussions with consultants, evaluation of patient's response to treatment, examination of patient, obtaining history from patient or surrogate, ordering and performing treatments and interventions, ordering and review of laboratory studies, ordering and review of radiographic studies, pulse oximetry and re-evaluation of patient's condition. Final Clinical Impression(s) / ED Diagnoses Final diagnoses:  Anemia, unspecified type    Rx / DC Orders ED Discharge Orders    None       Nat Christen, MD 04/20/19 1328    Nat Christen, MD 04/20/19 1446    Nat Christen, MD 04/20/19 1447

## 2019-04-20 NOTE — H&P (Signed)
History and Physical  [name]:[id number] DOB: 09-May-1940 DOA: 04/20/2019  Referring physician: Lacinda Axon MD   Pt coming from: Home   PCP: [name], MD   History taken from patient's niece and patient also present and participated  Chief Complaint: Weakness   HPI: [name] is a 79 y.o. female with chronic atrial fibrillation fully anticoagulated with Pradaxa, history of iron deficiency anemia, history of duodenal bleeding, chronic back pain, chronic osteoarthritis, dementia, type 2 diabetes mellitus has seen her PCP recently with complaints of progressive weakness and fatigue.  Her PCP perform some laboratory tests in the office and noted that her hemoglobin has been dropping.  Down to 7.0 when it had been 8 in September 2020.  The patient says she has noticed no black stools.  She is has denied blood in stool.  The patient does have a history of GI bleeding.  She was seen by GI and had a colonoscopy in 2012 reported as normal colon exam and EGD in 2013 with findings of duodenal bleeding also had a capsule endoscopy study in 2013 where some bleeding was noted.  The patient's caretaker noted that she had been checking the patient's home bowel movements regularly and has not noted any black stools tarry stools or blood in stool.  The patient denies having chest pain but reports that she has been having weakness and poor exercise tolerance.  She normally is able to ambulate with a walker but has been very weak in the past several days.  ED course: Labs retested and confirmed hemoglobin of 6.8.  Patient was noted to have platelet count of 475.  Normal WBC.  Glucose was elevated at 144.  Potassium was 3.7 and creatinine 1.34.  PT 16.0, INR 1.3.  Patient was ordered for blood transfusion.  Hemoccult test was positive.  The patient was vaccinated for COVID-19 in February 2021.  Hemoglobin A1c was 8.1%.  The patient was started on IV Protonix and hospital admission and GI consultation was  requested.  Review of Systems: All systems reviewed and apart from history of presenting illness, are negative.  Past Medical History:  Diagnosis Date  . Anemia, iron deficiency 2012   Evaluated by Dr. Oneida Alar; H&H of 9.3/30.8 with [place] in 10/2010; 3/3 positive Hemoccult cards in 07/2011  . Anxiety    was on Prozac for a couple of weeks  . Atrial fibrillation ([place])    takes Coumadin daily  . Bruises easily    takes Coumadin  . Chronic anticoagulation 07/21/2010  . Chronic back pain    related to knee pain  . Degenerative joint disease    Knees  . Dementia ([place])   . Depression   . Diabetes mellitus    takes Metformin daily  . Gastroesophageal reflux disease    takes Omeprazole daily  . Glaucoma   . Glaucoma    uses eye drops at night  . History of blood transfusion 1969  . History of gout    was on medication but taken off of several months ago  . Hx of colonic polyps   . Hx of migraines    last one about a yr ago;takes Topamax daily  . Hyperlipidemia    takes Pravastatin daily  . Hypertension    takes Hyzaar daily and Propranolol   . Insomnia    takes Restoril prn  . Joint pain   . Joint swelling   . Memory loss    takes donepezil  . Migraine   .  Migraine   . Nocturia   . Overweight(278.02)   . Pneumonia 1969   hx of  . Pulmonary embolism [facility]) May 2012   acute presentation, bilateral PE  . Skin spots-aging   . Urinary frequency    Past Surgical History:  Procedure Laterality Date  . CATARACT EXTRACTION W/PHACO Right 04/11/2012   Procedure: CATARACT EXTRACTION PHACO AND INTRAOCULAR LENS PLACEMENT (IOC);  Surgeon: Elta Guadeloupe T. Gershon Crane, MD;  Location: [facility];  Service: Ophthalmology;  Laterality: Right;  CDE=9.61  . CATARACT EXTRACTION W/PHACO Left 12/26/2012   Procedure: CATARACT EXTRACTION PHACO AND INTRAOCULAR LENS PLACEMENT (IOC);  Surgeon: Elta Guadeloupe T. Gershon Crane, MD;  Location: [facility];  Service: Ophthalmology;  Laterality: Left;  CDE:7.74  . COLONOSCOPY  Jan 2002; 2012    2002: Dr. Tamala Julian, ext. hemorrhoids, nl colon; 2012-adenomatous polyps, gastritis on EGD  . CYSTOSCOPY W/ URETERAL STENT PLACEMENT Bilateral 02/25/2018   Procedure: CYSTOSCOPY WITH BILATERAL RETROGRADE PYELOGRAM/BILATERAL URETERAL STENT PLACEMENT, BLADDER BIOPSIES WITH FULGERATION;  Surgeon: [name], MD;  Location: [facility];  Service: Urology;  Laterality: Bilateral;  . CYSTOSCOPY/URETEROSCOPY/HOLMIUM LASER/STENT PLACEMENT Bilateral 03/21/2018   Procedure: CYSTOSCOPY/URETEROSCOPY/HOLMIUM LASER/STENT PLACEMENT;  Surgeon: [name], MD;  Location: [facility];  Service: Urology;  Laterality: Bilateral;  ONLY NEEDS 60 MIN  . DILATION AND CURETTAGE OF UTERUS    . ESOPHAGOGASTRODUODENOSCOPY  08/24/2011   ESOPHAGOGASTRODUODENOSCOPY; esophageal dilatation; Rogene Houston, MD;  . Freda Munro CAPSULE STUDY  08/18/2011   Procedure: GIVENS CAPSULE STUDY;  Surgeon: Rogene Houston, MD;  Location: [facility];  Service: Endoscopy;  Laterality: N/A;  730  . KNEE ARTHROSCOPY W/ MENISCECTOMY  90's   Left  . ORIF ANKLE FRACTURE Right 90's  . TONSILLECTOMY    . TOTAL KNEE ARTHROPLASTY  10/20/2011   Procedure: TOTAL KNEE ARTHROPLASTY;  Surgeon: Ninetta Lights, MD;  Location: [place];  Service: Orthopedics;  Laterality: Left;  . UPPER GASTROINTESTINAL ENDOSCOPY    . URETHRAL DILATION     Social History:  reports that she has never smoked. She has never used smokeless tobacco. She reports that she does not drink alcohol or use drugs.  Allergies  Allergen Reactions  . Penicillins Itching    Did it involve swelling of the face/tongue/throat, SOB, or low BP? No Did it involve sudden or severe rash/hives, skin peeling, or any reaction on the inside of your mouth or nose? No Did you need to seek medical attention at a hospital or doctor's office? No When did it last happen?many years ago If all above answers are "NO", may proceed with cephalosporin use.    . Fentanyl Itching  .  Sulfonamide Derivatives Hives  . Codeine Itching and Rash    tussionex  Is tolerated by patient, no phenergan dm     Family History  Problem Relation Age of Onset  . Diabetes Mother   . Hypertension Mother   . Arthritis Mother   . Heart failure Mother   . Migraines Mother   . Kidney failure Mother   . Leukemia Father   . Migraines Father   . Kidney failure Father   . Diabetes Sister   . Hypertension Sister   . Diabetes Brother   . Hypertension Brother   . Hypertension Brother   . Hypertension Brother   . Hypertension Brother   . Diabetes Brother   . Diabetes Brother   . Diabetes Brother   . Pulmonary embolism Sister   . Migraines Sister   . Kidney failure Brother   .  Colon cancer Neg Hx   . Colon polyps Neg Hx     Prior to Admission medications   Medication Sig Start Date End Date Taking? Authorizing Provider  acetaminophen (TYLENOL) 500 MG tablet Take one tablet two times daily for chronic pain 04/19/19   [name], MD  albuterol (VENTOLIN HFA) 108 (90 Base) MCG/ACT inhaler Inhale 2 puffs into the lungs every 6 (six) hours as needed for wheezing or shortness of breath. 02/27/18   Roxan Hockey, MD  amLODipine (NORVASC) 10 MG tablet Take 1 tablet (10 mg total) by mouth daily. 02/27/18   Roxan Hockey, MD  atorvastatin (LIPITOR) 40 MG tablet Take 1 tablet (40 mg total) by mouth daily. 04/19/19   [name], MD  Blood Glucose Monitoring Suppl (ACCU-CHEK AVIVA PLUS) w/Device KIT For once daily testing dx E11.9 11/24/15   [name], MD  busPIRone (BUSPAR) 7.5 MG tablet Take 1 tablet (7.5 mg total) by mouth 3 (three) times daily. 04/19/19   [name], MD  cloNIDine (CATAPRES) 0.2 MG tablet Take 1 tablet (0.2 mg total) by mouth at bedtime. 04/10/18   [name], MD  clotrimazole-betamethasone (LOTRISONE) cream Apply 1 application topically 2 (two) times daily. D/c vusion ointment 04/19/19   [name], MD  colchicine 0.6 MG  tablet Take 1 tablet (0.6 mg total) by mouth daily. May decrease to once per day as needed for diarrhea 06/27/17   Tanna Furry, MD  COMBIGAN 0.2-0.5 % ophthalmic solution Place 1 drop into both eyes 2 (two) times daily. 03/23/17   [provider]  conjugated estrogens (PREMARIN) vaginal cream Place vaginally 2 (two) times a week. Apply sparingly ( fingertip)  Twice weekly to vaginal area on Wednesdays and Fridays 01/30/18   Irwin Brakeman L, MD  docusate sodium (COLACE) 100 MG capsule Take 1 capsule (100 mg total) by mouth daily as needed for mild constipation. 03/20/18   Hosie Poisson, MD  donepezil (ARICEPT) 10 MG tablet Take 1 tablet (10 mg total) by mouth at bedtime. 04/19/19   [name], MD  Fluoxetine HCl, PMDD, 10 MG TABS Take 1 tablet (10 mg total) by mouth daily. 01/27/18   Dashae Wilcher L, MD  glucose blood (ACCU-CHEK AVIVA PLUS) test strip USE TO TEST BLOOD SUAGR 2 TIMES A DAY 04/19/19   [name], MD  hydrOXYzine (VISTARIL) 25 MG capsule Take 1 capsule (25 mg total) by mouth 3 (three) times daily. 04/19/19   [name], MD  imipramine (TOFRANIL) 50 MG tablet TAKE 2 TABLETS (100 MG TOTAL) BY MOUTH AT BEDTIME. 04/19/19   [name], MD  insulin NPH-regular Human (NOVOLIN 70/30) (70-30) 100 UNIT/ML injection Inject 40 Units into the skin 2 (two) times daily with a meal. 09/27/18   [name], MD  Insulin Syringe-Needle U-100 (BD VEO INSULIN SYRINGE U/F) 31G X 15/64" 0.5 ML MISC Use to inject Novolin twice daily 08/17/18   [name], MD  meclizine (ANTIVERT) 25 MG tablet TAKE 1 TABLET THREE TIMES DAILY AS NEEDED FOR DIZZINESS. 08/07/18   [name], MD  montelukast (SINGULAIR) 10 MG tablet TAKE 1 TABLET BY MOUTH EVERYDAY AT BEDTIME 10/22/18   Perlie Mayo, NP  nystatin (MYCOSTATIN/NYSTOP) powder Apply topically daily as needed (for rash under breasts). 12/04/18   [name], MD  olopatadine (PATANOL) 0.1 % ophthalmic  solution Place 1 drop into both eyes 2 (two) times daily. 01/27/18   Murlean Iba, MD  omeprazole (PRILOSEC) 40 MG capsule Take 1 capsule (40 mg total) by mouth 2 (two) times daily. 04/19/19   [name], MD  oxyCODONE-acetaminophen (PERCOCET) 10-325 MG tablet Take one tablet by mouth two times daily for pain 02/21/19   [name], MD  oxyCODONE-acetaminophen [facility]) 10-325 MG tablet Take one tablet by mouth two times  Daily for back pain 03/22/19 04/21/19  [name], MD  oxyCODONE-acetaminophen (PERCOCET) 10-325 MG tablet Take one tablet by mouth two times daily for back pain 04/21/19 05/21/19  [name], MD  oxyCODONE-acetaminophen (PERCOCET) 10-325 MG tablet Take one tablet by mouht two times daily for back pain 05/21/19 06/20/19  [name], MD  polyethylene glycol (MIRALAX / Floria Raveling) packet Take 17 g by mouth daily. 03/21/18   Hosie Poisson, MD  PRADAXA 150 MG CAPS capsule TAKE 1 CAPSULE BY MOUTH EVERY 12 (TWELVE) HOURS. 02/28/19   Branch, Alphonse Guild, MD  SUMAtriptan (IMITREX) 25 MG tablet Take 1 tablet (25 mg total) by mouth every 2 (two) hours as needed for migraine. May repeat in 2 hours if headache persists or recurs. 12/04/18   [name], MD  SUMAtriptan (IMITREX) 5 MG/ACT nasal spray Place 1 spray into the nose every 2 (two) hours as needed for migraine.  01/16/17   [provider]  temazepam (RESTORIL) 30 MG capsule Take 1 capsule (30 mg total) by mouth at bedtime as needed for sleep. 12/04/18   [name], MD  topiramate (TOPAMAX) 100 MG tablet Take 1 tablet (100 mg total) by mouth 2 (two) times daily. 04/19/19   [name], MD  Vitamin D, Ergocalciferol, (DRISDOL) 50000 units CAPS capsule TAKE 1 CAPSULE (50,000 UNITS TOTAL) BY MOUTH ONCE A WEEK. Patient taking differently: Take 50,000 Units by mouth every 7 (seven) days. Saturday 11/18/17   [name], MD   Physical Exam: Vitals:   04/20/19 1340 04/20/19  1340 04/20/19 1341 04/20/19 1538  BP:   (!) 167/60 (!) 123/105  Pulse:  87 88 86  Resp:   16 18  Temp:      TempSrc:      SpO2:  99% 97% 99%  Weight:      Height: 5' 7"  (1.702 m)       General exam: Overweight elderly female lying in bed she is awake and alert cooperative oriented to person place and situation, lying comfortably supine on the gurney in no obvious distress.  Head, eyes and ENT: Nontraumatic and normocephalic. Pupils equally reacting to light and accommodation. Oral mucosa moist.  Neck: Supple. No JVD, carotid bruit or thyromegaly.  Lymphatics: No lymphadenopathy.  Respiratory system: Clear to auscultation. No increased work of breathing.  Cardiovascular system: S1 and S2 heard, irregularly irregular.  Mild JVD, no murmurs, gallops, clicks.  Trace pedal edema.  Gastrointestinal system: Abdomen is obese, nondistended, soft and nontender. Normal bowel sounds heard. No organomegaly or masses appreciated.  Central nervous system: Alert and oriented. No focal neurological deficits.  Extremities: Symmetric 5 x 5 power. Peripheral pulses symmetrically felt.   Skin: No rashes or acute findings.  Musculoskeletal system: Diffuse osteoarthritic changes in the knees upper and lower extremities.  Psychiatry: Pleasant and cooperative.  Labs on Admission:  Basic Metabolic Panel: Recent Labs  Lab 04/19/19 1152 04/20/19 1254  NA 135 135  K 4.0 3.7  CL 103 105  CO2 22 22  GLUCOSE 167* 144*  BUN 17 17  CREATININE 1.12* 1.34*  CALCIUM 9.3 8.9  Liver Function Tests: Recent Labs  Lab 04/20/19 1254  AST 14*  ALT 13  ALKPHOS 101  BILITOT 0.4  PROT 7.7  ALBUMIN 3.7   No results for input(s): LIPASE, AMYLASE in the last 168 hours. No results for input(s): AMMONIA in the last 168 hours. CBC: Recent Labs  Lab 04/19/19 1152 04/20/19 1254  WBC 9.4 9.6  NEUTROABS  --  7.4  HGB 7.0* 6.8*  HCT 25.5* 26.3*  MCV 62.5* 68.0*  PLT 493* 475*   Cardiac Enzymes: No  results for input(s): CKTOTAL, CKMB, CKMBINDEX, TROPONINI in the last 168 hours.  BNP (last 3 results) No results for input(s): PROBNP in the last 8760 hours. CBG: No results for input(s): GLUCAP in the last 168 hours.  Radiological Exams on Admission: No results found.  Assessment/Plan Active Problems:   Acute GI bleeding   Dyslipidemia   Essential hypertension   Gastroesophageal reflux disease   Anemia, iron deficiency   Memory loss   Depression with anxiety   Vitamin D deficiency   Metabolic syndrome X   Chronic anticoagulation   Type 2 diabetes mellitus with neurological complications (HCC)   Chronic bronchitis (HCC)   Chronic pain syndrome   History of pulmonary embolism   Generalized osteoarthritis   CKD stage G3b/A1, GFR 30-44 and albumin creatinine ratio <30 mg/g  1. Acute on chronic blood loss anemia - Hg has been trending down since last Hg of 7.9 in Sept 2020.  Patient has Hemoccult positive stools.  She is also fully anticoagulated on Pradaxa.  The Pradaxa has been placed on hold temporarily while we are evaluating the acute bleed.  The patient is being treated with packed red blood cell transfusion 2 units and will be monitored closely on telemetry.  I have requested a GI consultation given that she has had duodenal lesions in the past that have been known to bleed.  Continue supportive care as ordered. 2. Chronic atrial fibrillation-patient is rate controlled at this time.  She is fully anticoagulated with Pradaxa which is temporarily being held as noted above. 3. Stage IIIb CKD-currently stable we will continue to monitor. 4. Type 2 diabetes mellitus with neurological complications-supplemental sliding scale coverage ordered and CBG testing ordered.  Poorly controlled disease as evidenced by hemoglobin A1c of 8.0%. 5. History of vitamin D deficiency-vitamin D level ordered. 6. Chronic pain-secondary to back pain and diffuse osteoarthritis controlled with home pain  medications as needed. 7. 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 8. GERD-IV Protonix 40 mg twice daily ordered for GI protection. 9. Iron deficiency anemia secondary to chronic blood loss-anemia panel is in process. 10. History of pulmonary emboli-patient has a history of bilateral pulmonary emboli.  She is currently fully anticoagulated with Pradaxa, this is currently on hold and SCDs have been ordered for DVT prophylaxis. 11. Dementia and memory loss-delirium precautions. 12. Essential hypertension-temporarily holding home blood pressure medications in the setting of soft blood pressures.  Will follow. 13. DNR present on admission-we will continue to honor her wishes while in hospital.   DVT Prophylaxis: SCDs Code Status: DNR Family Communication: Niece at bedside was fully updated and questions answered Disposition Plan: Presenting from home she has 24/7 caregivers in place and anticipate after work-up for GI bleeding blood transfusion and GI consultation likely would return home when medically stable  Time spent: 60 minutes  Irwin Brakeman, MD [facility] How to contact the [facility] Attending or Consulting provider 7A -  7P or covering provider during after hours [place], for this patient?  1. Check the care team in [facility] and look for a) attending/consulting [facility] provider listed and b) the [facility] team listed 2. Log into [email or web address] and use [place]'s universal password to access. If you do not have the password, please contact the hospital operator. 3. Locate the [facility] provider you are looking for under [facility] and page to a number that you can be directly reached. 4. If you still have difficulty reaching the provider, please page the [facility] (Director on Call) for the Hospitalists listed on amion for assistance.

## 2019-04-20 NOTE — ED Notes (Signed)
Any questions about patient's care call sister [name] [phone].

## 2019-04-20 NOTE — ED Triage Notes (Signed)
Per niece, pt got blood work done yesterday and was told to come here by Dr. Moshe Cipro. She is here for a low hemoglobin and hematocrit. Pt received COVID vaccine in February. Pt has been anemic in the past.

## 2019-04-21 DIAGNOSIS — I10 Essential (primary) hypertension: Secondary | ICD-10-CM

## 2019-04-21 DIAGNOSIS — R1310 Dysphagia, unspecified: Secondary | ICD-10-CM

## 2019-04-21 DIAGNOSIS — D509 Iron deficiency anemia, unspecified: Secondary | ICD-10-CM

## 2019-04-21 DIAGNOSIS — E8881 Metabolic syndrome: Secondary | ICD-10-CM

## 2019-04-21 LAB — GLUCOSE, CAPILLARY
Glucose-Capillary: 66 mg/dL — ABNORMAL LOW (ref 70–99)
Glucose-Capillary: 66 mg/dL — ABNORMAL LOW (ref 70–99)
Glucose-Capillary: 154 mg/dL — ABNORMAL HIGH (ref 70–99)
Glucose-Capillary: 115 mg/dL — ABNORMAL HIGH (ref 70–99)
Glucose-Capillary: 134 mg/dL — ABNORMAL HIGH (ref 70–99)
Glucose-Capillary: 151 mg/dL — ABNORMAL HIGH (ref 70–99)
Glucose-Capillary: 154 mg/dL — ABNORMAL HIGH (ref 70–99)

## 2019-04-21 LAB — COMPREHENSIVE METABOLIC PANEL
ALT: 11 U/L (ref 0–44)
AST: 14 U/L — ABNORMAL LOW (ref 15–41)
Albumin: 3.3 g/dL — ABNORMAL LOW (ref 3.5–5.0)
Alkaline Phosphatase: 91 U/L (ref 38–126)
Anion gap: 9 (ref 5–15)
BUN: 12 mg/dL (ref 8–23)
CO2: 22 mmol/L (ref 22–32)
Calcium: 8.6 mg/dL — ABNORMAL LOW (ref 8.9–10.3)
Chloride: 106 mmol/L (ref 98–111)
Creatinine, Ser: 1.24 mg/dL — ABNORMAL HIGH (ref 0.44–1.00)
GFR calc Af Amer: 48 mL/min — ABNORMAL LOW (ref >60)
GFR calc non Af Amer: 41 mL/min — ABNORMAL LOW (ref >60)
Glucose, Bld: 176 mg/dL — ABNORMAL HIGH (ref 70–99)
Potassium: 3.9 mmol/L (ref 3.5–5.1)
Sodium: 137 mmol/L (ref 135–145)
Total Bilirubin: 0.3 mg/dL (ref 0.3–1.2)
Total Protein: 7.1 g/dL (ref 6.5–8.1)

## 2019-04-21 LAB — PREPARE RBC (CROSSMATCH)

## 2019-04-21 LAB — CBC
HCT: 30.8 % — ABNORMAL LOW (ref 36.0–46.0)
Hemoglobin: 8.6 g/dL — ABNORMAL LOW (ref 12.0–15.0)
MCH: 20.6 pg — ABNORMAL LOW (ref 26.0–34.0)
MCHC: 27.9 g/dL — ABNORMAL LOW (ref 30.0–36.0)
MCV: 73.9 fL — ABNORMAL LOW (ref 80.0–100.0)
Platelets: 391 10*3/uL (ref 150–400)
RBC: 4.17 MIL/uL (ref 3.87–5.11)
RDW: 23.5 % — ABNORMAL HIGH (ref 11.5–15.5)
WBC: 9.2 10*3/uL (ref 4.0–10.5)
nRBC: 0 % (ref 0.0–0.2)

## 2019-04-21 LAB — MAGNESIUM: Magnesium: 2.1 mg/dL (ref 1.7–2.4)

## 2019-04-21 MED ORDER — INSULIN ASPART PROT & ASPART (70-30 MIX) 100 UNIT/ML ~~LOC~~ SUSP
10.0000 [IU] | Freq: Two times a day (BID) | SUBCUTANEOUS | Status: DC
  Administered 2019-04-21 (×2): 10 [IU] via SUBCUTANEOUS
  Filled 2019-04-21: qty 10

## 2019-04-21 NOTE — Progress Notes (Signed)
Patient CBG=66, patient given juice with sugar to increase CBG.  Will recheck in 30 minutes

## 2019-04-21 NOTE — Consult Note (Signed)
Referring Provider: No ref. provider found Primary Care Physician:  [name], MD Primary Gastroenterologist:  Dr. Laural Golden  Reason for Consultation: GI bleed  HPI: Pleasant 79 year old lady with multiple comorbidities including atrial fibrillation on chronic Pradaxa admitted to the hospital with progressing weakness and fatigue.  Seen in ED yesterday hemoglobin 6.8 with MCV 68.;  Down from baseline of 7.9  5 months ago.  Occult blood positive. Patient has not had any melena, hematochezia or hematemesis.  No abdominal pain. History of GI bleeding back in 2012/ 2013/IDA.  Dr. Oneida Alar performed EGD revealing non-H. pylori gastritis, esophageal dilation for possible stricture.  Colonoscopy 2012 demonstrated multiple simple adenomas and diverticulosis. Patient started seeing Dr. Laural Golden in 2013.  Capsule study performed  revealed some fresh blood in proximal duodenum.  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  This nice lady has been transfused 2 units of packed red blood cells; hemoglobin 8.6 this morning.  Clinically, has not had any overt bleeding while hospitalized thus far. Iron studies this admission: Ferritin 5, TIBC 404, sat 2%.  Patient denies reflux symptoms.  However, medication list says she has been taking omeprazole 40 mg twice daily.  Does note intermittent esophageal dysphagia to solids from time to time.  No aspirin/NSAIDs.   Past Medical History:  Diagnosis Date  . Anemia, iron deficiency 2012   Evaluated by Dr. Oneida Alar; H&H of 9.3/30.8 with [place] in 10/2010; 3/3 positive Hemoccult cards in 07/2011  . Anxiety    was on Prozac for a couple of weeks  . Atrial fibrillation ([place])    takes Coumadin daily  . Bruises easily    takes Coumadin  . Chronic anticoagulation 07/21/2010  . Chronic back pain    related to knee pain  . Degenerative joint disease    Knees  . Dementia ([place])   . Depression   . Diabetes mellitus    takes Metformin daily  .  Gastroesophageal reflux disease    takes Omeprazole daily  . Glaucoma   . Glaucoma    uses eye drops at night  . History of blood transfusion 1969  . History of gout    was on medication but taken off of several months ago  . Hx of colonic polyps   . Hx of migraines    last one about a yr ago;takes Topamax daily  . Hyperlipidemia    takes Pravastatin daily  . Hypertension    takes Hyzaar daily and Propranolol   . Insomnia    takes Restoril prn  . Joint pain   . Joint swelling   . Memory loss    takes donepezil  . Migraine   . Migraine   . Nocturia   . Overweight(278.02)   . Pneumonia 1969   hx of  . Pulmonary embolism [facility]) May 2012   acute presentation, bilateral PE  . Skin spots-aging   . Urinary frequency     Past Surgical History:  Procedure Laterality Date  . CATARACT EXTRACTION W/PHACO Right 04/11/2012   Procedure: CATARACT EXTRACTION PHACO AND INTRAOCULAR LENS PLACEMENT (IOC);  Surgeon: Elta Guadeloupe T. Gershon Crane, MD;  Location: [facility];  Service: Ophthalmology;  Laterality: Right;  CDE=9.61  . CATARACT EXTRACTION W/PHACO Left 12/26/2012   Procedure: CATARACT EXTRACTION PHACO AND INTRAOCULAR LENS PLACEMENT (IOC);  Surgeon: Elta Guadeloupe T. Gershon Crane, MD;  Location: [facility];  Service: Ophthalmology;  Laterality: Left;  CDE:7.74  . COLONOSCOPY  Jan 2002; 2012   2002: Dr. Tamala Julian, ext.  hemorrhoids, nl colon; 2012-adenomatous polyps, gastritis on EGD  . CYSTOSCOPY W/ URETERAL STENT PLACEMENT Bilateral 02/25/2018   Procedure: CYSTOSCOPY WITH BILATERAL RETROGRADE PYELOGRAM/BILATERAL URETERAL STENT PLACEMENT, BLADDER BIOPSIES WITH FULGERATION;  Surgeon: [name], MD;  Location: [facility];  Service: Urology;  Laterality: Bilateral;  . CYSTOSCOPY/URETEROSCOPY/HOLMIUM LASER/STENT PLACEMENT Bilateral 03/21/2018   Procedure: CYSTOSCOPY/URETEROSCOPY/HOLMIUM LASER/STENT PLACEMENT;  Surgeon: [name], MD;  Location: [facility];  Service: Urology;  Laterality: Bilateral;  ONLY  NEEDS 60 MIN  . DILATION AND CURETTAGE OF UTERUS    . ESOPHAGOGASTRODUODENOSCOPY  08/24/2011   ESOPHAGOGASTRODUODENOSCOPY; esophageal dilatation; Rogene Houston, MD;  . Freda Munro CAPSULE STUDY  08/18/2011   Procedure: GIVENS CAPSULE STUDY;  Surgeon: Rogene Houston, MD;  Location: [facility];  Service: Endoscopy;  Laterality: N/A;  730  . KNEE ARTHROSCOPY W/ MENISCECTOMY  90's   Left  . ORIF ANKLE FRACTURE Right 90's  . TONSILLECTOMY    . TOTAL KNEE ARTHROPLASTY  10/20/2011   Procedure: TOTAL KNEE ARTHROPLASTY;  Surgeon: Ninetta Lights, MD;  Location: [place];  Service: Orthopedics;  Laterality: Left;  . UPPER GASTROINTESTINAL ENDOSCOPY    . URETHRAL DILATION      Prior to Admission medications   Medication Sig Start Date End Date Taking? Authorizing Provider  albuterol (VENTOLIN HFA) 108 (90 Base) MCG/ACT inhaler Inhale 2 puffs into the lungs every 6 (six) hours as needed for wheezing or shortness of breath. 02/27/18  Yes Emokpae, Courage, MD  amLODipine (NORVASC) 10 MG tablet Take 1 tablet (10 mg total) by mouth daily. 02/27/18  Yes Emokpae, Courage, MD  atorvastatin (LIPITOR) 40 MG tablet Take 1 tablet (40 mg total) by mouth daily. 04/19/19  Yes [name], MD  busPIRone (BUSPAR) 7.5 MG tablet Take 1 tablet (7.5 mg total) by mouth 3 (three) times daily. 04/19/19  Yes [name], MD  cloNIDine (CATAPRES) 0.2 MG tablet Take 1 tablet (0.2 mg total) by mouth at bedtime. 04/10/18  Yes [name], MD  clotrimazole-betamethasone (LOTRISONE) cream Apply 1 application topically 2 (two) times daily. D/c vusion ointment 04/19/19  Yes [name], MD  colchicine 0.6 MG tablet Take 1 tablet (0.6 mg total) by mouth daily. May decrease to once per day as needed for diarrhea Patient taking differently: Take 0.6 mg by mouth daily as needed (for gout pain relief).  06/27/17  Yes Tanna Furry, MD  COMBIGAN 0.2-0.5 % ophthalmic solution Place 1 drop into both eyes 2 (two) times daily.  03/23/17  Yes [provider]  conjugated estrogens (PREMARIN) vaginal cream Place vaginally 2 (two) times a week. Apply sparingly ( fingertip)  Twice weekly to vaginal area on Wednesdays and Fridays 01/30/18  Yes Johnson, Clanford L, MD  docusate sodium (COLACE) 100 MG capsule Take 1 capsule (100 mg total) by mouth daily as needed for mild constipation. Patient taking differently: Take 100 mg by mouth daily.  03/20/18  Yes Hosie Poisson, MD  donepezil (ARICEPT) 10 MG tablet Take 1 tablet (10 mg total) by mouth at bedtime. 04/19/19  Yes [name], MD  Fluoxetine HCl, PMDD, 10 MG TABS Take 1 tablet (10 mg total) by mouth daily. 01/27/18  Yes Johnson, Clanford L, MD  hydrOXYzine (VISTARIL) 25 MG capsule Take 1 capsule (25 mg total) by mouth 3 (three) times daily. 04/19/19  Yes [name], MD  imipramine (TOFRANIL) 50 MG tablet TAKE 2 TABLETS (100 MG TOTAL) BY MOUTH AT BEDTIME. 04/19/19  Yes [name],  MD  insulin NPH-regular Human (NOVOLIN 70/30) (70-30) 100 UNIT/ML injection Inject 40 Units into the skin 2 (two) times daily with a meal. 09/27/18  Yes [name], MD  meclizine (ANTIVERT) 25 MG tablet TAKE 1 TABLET THREE TIMES DAILY AS NEEDED FOR DIZZINESS. Patient taking differently: Take 25 mg by mouth 3 (three) times daily as needed for dizziness.  08/07/18  Yes [name], MD  montelukast (SINGULAIR) 10 MG tablet TAKE 1 TABLET BY MOUTH EVERYDAY AT BEDTIME 10/22/18  Yes Perlie Mayo, NP  nystatin (MYCOSTATIN/NYSTOP) powder Apply topically daily as needed (for rash under breasts). 12/04/18  Yes [name], MD  olopatadine (PATANOL) 0.1 % ophthalmic solution Place 1 drop into both eyes 2 (two) times daily. 01/27/18  Yes Johnson, Clanford L, MD  omeprazole (PRILOSEC) 40 MG capsule Take 1 capsule (40 mg total) by mouth 2 (two) times daily. 04/19/19  Yes [name], MD  oxyCODONE-acetaminophen (PERCOCET) 10-325 MG tablet Take one tablet by mouth  two times daily for pain 02/21/19  Yes [name], MD  polyethylene glycol [facility] / GLYCOLAX) packet Take 17 g by mouth daily. Patient taking differently: Take 17 g by mouth daily as needed for mild constipation or moderate constipation.  03/21/18  Yes Hosie Poisson, MD  PRADAXA 150 MG CAPS capsule TAKE 1 CAPSULE BY MOUTH EVERY 12 (TWELVE) HOURS. Patient taking differently: Take 150 mg by mouth every 12 (twelve) hours.  02/28/19  Yes Branch, Alphonse Guild, MD  SUMAtriptan (IMITREX) 25 MG tablet Take 1 tablet (25 mg total) by mouth every 2 (two) hours as needed for migraine. May repeat in 2 hours if headache persists or recurs. 12/04/18  Yes [name], MD  SUMAtriptan Dellis Filbert) 5 MG/ACT nasal spray Place 1 spray into the nose every 2 (two) hours as needed for migraine.  01/16/17  Yes [provider]  temazepam (RESTORIL) 30 MG capsule Take 1 capsule (30 mg total) by mouth at bedtime as needed for sleep. 12/04/18  Yes [name], MD  topiramate (TOPAMAX) 100 MG tablet Take 1 tablet (100 mg total) by mouth 2 (two) times daily. 04/19/19  Yes [name], MD  Vitamin D, Ergocalciferol, (DRISDOL) 50000 units CAPS capsule TAKE 1 CAPSULE (50,000 UNITS TOTAL) BY MOUTH ONCE A WEEK. Patient taking differently: Take 50,000 Units by mouth every 7 (seven) days. Saturday 11/18/17  Yes [name], MD  acetaminophen (TYLENOL) 500 MG tablet Take one tablet two times daily for chronic pain Patient taking differently: Take 500 mg by mouth in the morning and at bedtime. for chronic pain 04/19/19   [name], MD    Current Facility-Administered Medications  Medication Dose Route Frequency Provider Last Rate Last Admin  . 0.9 %  sodium chloride infusion (Manually program via Guardrails IV Fluids)   Intravenous Once Johnson, Clanford L, MD      . 0.9 %  sodium chloride infusion  250 mL Intravenous PRN Johnson, Clanford L, MD      . acetaminophen (TYLENOL) tablet 650 mg   650 mg Oral Q6H PRN Johnson, Clanford L, MD       Or  . acetaminophen (TYLENOL) suppository 650 mg  650 mg Rectal Q6H PRN Johnson, Clanford L, MD      . albuterol (PROVENTIL) (2.5 MG/3ML) 0.083% nebulizer solution 3 mL  3 mL Inhalation Q6H PRN Johnson, Clanford L, MD      . amLODipine (NORVASC) tablet 10 mg  10 mg Oral  Daily Wynetta Emery, Clanford L, MD   10 mg at 04/21/19 [id number]  . atorvastatin (LIPITOR) tablet 40 mg  40 mg Oral Daily Johnson, Clanford L, MD   40 mg at 04/21/19 [id number]  . brimonidine (ALPHAGAN) 0.2 % ophthalmic solution 1 drop  1 drop Both Eyes BID Minda Ditto, RPH   1 drop at 04/21/19 0931   And  . timolol (TIMOPTIC) 0.5 % ophthalmic solution 1 drop  1 drop Both Eyes BID Minda Ditto, RPH   1 drop at 04/21/19 0931  . busPIRone (BUSPAR) tablet 7.5 mg  7.5 mg Oral TID Wynetta Emery, Clanford L, MD   7.5 mg at 04/21/19 0924  . cloNIDine (CATAPRES) tablet 0.2 mg  0.2 mg Oral QHS Johnson, Clanford L, MD   0.2 mg at 04/20/19 2147  . colchicine tablet 0.6 mg  0.6 mg Oral Daily PRN Johnson, Clanford L, MD      . docusate sodium (COLACE) capsule 100 mg  100 mg Oral Daily Johnson, Clanford L, MD   100 mg at 04/21/19 0924  . donepezil (ARICEPT) tablet 10 mg  10 mg Oral QHS Johnson, Clanford L, MD   10 mg at 04/20/19 2148  . FLUoxetine (PROZAC) capsule 10 mg  10 mg Oral Daily Johnson, Clanford L, MD   10 mg at 04/21/19 [id number]  . hydrOXYzine (ATARAX/VISTARIL) tablet 25 mg  25 mg Oral TID PRN Johnson, Clanford L, MD      . imipramine (TOFRANIL) tablet 100 mg  100 mg Oral QHS Johnson, Clanford L, MD   100 mg at 04/20/19 2148  . insulin aspart (novoLOG) injection 0-5 Units  0-5 Units Subcutaneous QHS Johnson, Clanford L, MD      . insulin aspart (novoLOG) injection 0-9 Units  0-9 Units Subcutaneous TID WC Johnson, Clanford L, MD      . insulin aspart protamine- aspart (NOVOLOG MIX 70/30) injection 10 Units  10 Units Subcutaneous BID WC Wynetta Emery, Clanford L, MD   10 Units at 04/21/19 0927  . montelukast  (SINGULAIR) tablet 10 mg  10 mg Oral QHS Johnson, Clanford L, MD   10 mg at 04/20/19 2147  . olopatadine (PATANOL) 0.1 % ophthalmic solution 1 drop  1 drop Both Eyes BID PRN Irwin Brakeman L, MD   1 drop at 04/21/19 0928  . ondansetron (ZOFRAN) tablet 4 mg  4 mg Oral Q6H PRN Johnson, Clanford L, MD       Or  . ondansetron (ZOFRAN) injection 4 mg  4 mg Intravenous Q6H PRN Johnson, Clanford L, MD      . oxyCODONE-acetaminophen (PERCOCET/ROXICET) 5-325 MG per tablet 1 tablet  1 tablet Oral Q12H PRN Wynetta Emery, Clanford L, MD   1 tablet at 04/21/19 [id number]   And  . oxyCODONE (Oxy IR/ROXICODONE) immediate release tablet 5 mg  5 mg Oral Q12H PRN Johnson, Clanford L, MD      . pantoprazole (PROTONIX) injection 40 mg  40 mg Intravenous Q12H Johnson, Clanford L, MD   40 mg at 04/21/19 0925  . polyethylene glycol (MIRALAX / GLYCOLAX) packet 17 g  17 g Oral Daily PRN Johnson, Clanford L, MD      . sodium chloride flush (NS) 0.9 % injection 3 mL  3 mL Intravenous Q12H Johnson, Clanford L, MD   3 mL at 04/21/19 0926  . sodium chloride flush (NS) 0.9 % injection 3 mL  3 mL Intravenous PRN Johnson, Clanford L, MD      . topiramate (TOPAMAX) tablet 100 mg  100 mg Oral BID Wynetta Emery, Clanford L, MD   100 mg at 04/21/19 [id number]   Facility-Administered Medications Ordered in Other Encounters  Medication Dose Route Frequency Provider Last Rate Last Admin  . fentaNYL (SUBLIMAZE) injection 25-50 mcg  25-50 mcg Intravenous Q5 min PRN Lerry Liner, MD        Allergies as of 04/20/2019 - Review Complete 04/20/2019  Allergen Reaction Noted  . Penicillins Itching 11/16/2013  . Fentanyl Itching 08/09/2011  . Sulfonamide derivatives Hives 02/03/2007  . Codeine Itching and Rash     Family History  Problem Relation Age of Onset  . Diabetes Mother   . Hypertension Mother   . Arthritis Mother   . Heart failure Mother   . Migraines Mother   . Kidney failure Mother   . Leukemia Father   . Migraines Father   . Kidney  failure Father   . Diabetes Sister   . Hypertension Sister   . Diabetes Brother   . Hypertension Brother   . Hypertension Brother   . Hypertension Brother   . Hypertension Brother   . Diabetes Brother   . Diabetes Brother   . Diabetes Brother   . Pulmonary embolism Sister   . Migraines Sister   . Kidney failure Brother   . Colon cancer Neg Hx   . Colon polyps Neg Hx     Social History   Socioeconomic History  . Marital status: Widowed    Spouse name: Not on file  . Number of children: 2  . Years of education: 52  . Highest education level: Not on file  Occupational History  . 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 Smoking status: Never Smoker  . Smokeless tobacco: Never Used  Substance and Sexual Activity  . Alcohol use: No  . Drug use: No  . Sexual activity: Not Currently    Birth control/protection: Post-menopausal  Other Topics Concern  . Not on file  Social History Narrative   Lives at home with husband.   Right-handed.   1 cup caffeine per day.   Social Determinants of Health   Financial Resource Strain:   . Difficulty of Paying Living Expenses: Not on file  Food Insecurity:   . Worried About Charity fundraiser in the Last Year: Not on file  . Ran Out of Food in the Last Year: Not on file  Transportation Needs:   . Lack of Transportation (Medical): Not on file  . Lack of Transportation (Non-Medical): Not on file  Physical Activity:   . Days of Exercise per Week: Not on file  . Minutes of Exercise per Session: Not on file  Stress:   . Feeling of Stress : Not on file  Social Connections:   . Frequency of Communication with Friends and Family: Not on file  . Frequency of Social Gatherings with Friends and Family: Not on file  . Attends Religious Services: Not on file  . Active Member of Clubs or Organizations: Not on file  . Attends Archivist Meetings: Not on file  . Marital Status: Not  on file  Intimate Partner Violence:   . Fear of Current or Ex-Partner: Not on file  . Emotionally Abused: Not on file  . Physically Abused: Not on file  . Sexually Abused: Not on file    Review of Systems: As in history of present illness. Physical Exam: Vital signs in last 24  hours: Temp:  [97.3 F (36.3 C)-98.4 F (36.9 C)] 97.5 F (36.4 C) (03/06 0315) Pulse Rate:  [10-106] 72 (03/06 0315) Resp:  [16-24] 18 (03/06 0315) BP: (113-167)/(60-105) 138/66 (03/06 0315) SpO2:  [97 %-100 %] 99 % (03/06 0700) Weight:  [106.7 kg] 106.7 kg (03/05 1820) Last BM Date: 04/19/19 General:   Alert, very pleasant conversant  and cooperative in NAD Lungs:  Clear throughout to auscultation.   No wheezes, crackles, or rhonchi. No acute distress. Heart:  Regular rate and rhythm; no murmurs, clicks, rubs,  or gallops. Abdomen:  Soft, nontender and nondistended. No masses, hepatosplenomegaly or hernias noted. Normal bowel sounds, without guarding, and without rebound.    Intake/Output from previous day: 03/05 0701 - 03/06 0700 In: 1163.2 [I.V.:31.2; Blood:632; IV Piggyback:500] Out: 7700 [Urine:7700] Intake/Output this shift: No intake/output data recorded.  Lab Results: Recent Labs    04/19/19 1152 04/20/19 1254 04/21/19 0610  WBC 9.4 9.6 9.2  HGB 7.0* 6.8* 8.6*  HCT 25.5* 26.3* 30.8*  PLT 493* 475* 391   BMET Recent Labs    04/19/19 1152 04/20/19 1254 04/21/19 0610  NA 135 135 137  K 4.0 3.7 3.9  CL 103 105 106  CO2 22 22 22   GLUCOSE 167* 144* 176*  BUN 17 17 12   CREATININE 1.12* 1.34* 1.24*  CALCIUM 9.3 8.9 8.6*   LFT Recent Labs    04/21/19 0610  PROT 7.1  ALBUMIN 3.3*  AST 14*  ALT 11  ALKPHOS 91  BILITOT 0.3   PT/INR Recent Labs    04/20/19 1254  LABPROT 16.0*  INR 1.3*     Impression: Pleasant 79 year old lady with multiple comorbidities including atrial fibrillation on chronic anticoagulation (Pradaxa) admitted to the hospital with acute on chronic  anemia -  occult blood positive.  Profound iron deficiency.  Intermittent esophageal dysphagia. GI work-up back in 2000 01/2012 revealed non-H. pylori gastritis possible peptic stricture dilated.  Multiple simple colonic adenomas and diverticulosis. Capsule study performed revealed fresh blood in the proximal duodenum. In this setting, may have recurrent bleeding secondary to AVMs while anticoagulated.  Other pathology in her upper GI tract may have crept up since her prior evaluation. Multiple colonic adenomas removed from her colon nearly 10 years ago.  Pradaxa held on admission.  Recommendations: I have offered the patient a EGD with possible esophageal dilation tomorrow. She is on multiple medications but did well with conscious sedation for both her EGD and colonoscopy previously.  She is now 75 years older.  I feel she will do well with conscious sedation once again. The risks, benefits, limitations, alternatives and imponderables have been reviewed with the patient. Potential for esophageal dilation, biopsy, etc. have also been reviewed.  Questions have been answered. All parties agreeable.       Notice:  This dictation was prepared with Dragon dictation along with smaller phrase technology. Any transcriptional errors that result from this process are unintentional and may not be corrected upon review.

## 2019-04-21 NOTE — H&P (View-Only) (Signed)
Referring Provider: No ref. provider found Primary Care Physician:  [name], MD Primary Gastroenterologist:  Dr. Laural Golden  Reason for Consultation: GI bleed  HPI: Pleasant 79 year old lady with multiple comorbidities including atrial fibrillation on chronic Pradaxa admitted to the hospital with progressing weakness and fatigue.  Seen in ED yesterday hemoglobin 6.8 with MCV 68.;  Down from baseline of 7.9  5 months ago.  Occult blood positive. Patient has not had any melena, hematochezia or hematemesis.  No abdominal pain. History of GI bleeding back in 2012/ 2013/IDA.  Dr. Oneida Alar performed EGD revealing non-H. pylori gastritis, esophageal dilation for possible stricture.  Colonoscopy 2012 demonstrated multiple simple adenomas and diverticulosis. Patient started seeing Dr. Laural Golden in 2013.  Capsule study performed  revealed some fresh blood in proximal duodenum.  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  This nice lady has been transfused 2 units of packed red blood cells; hemoglobin 8.6 this morning.  Clinically, has not had any overt bleeding while hospitalized thus far. Iron studies this admission: Ferritin 5, TIBC 404, sat 2%.  Patient denies reflux symptoms.  However, medication list says she has been taking omeprazole 40 mg twice daily.  Does note intermittent esophageal dysphagia to solids from time to time.  No aspirin/NSAIDs.   Past Medical History:  Diagnosis Date  . Anemia, iron deficiency 2012   Evaluated by Dr. Oneida Alar; H&H of 9.3/30.8 with [facility] in 10/2010; 3/3 positive Hemoccult cards in 07/2011  . Anxiety    was on Prozac for a couple of weeks  . Atrial fibrillation ([place])    takes Coumadin daily  . Bruises easily    takes Coumadin  . Chronic anticoagulation 07/21/2010  . Chronic back pain    related to knee pain  . Degenerative joint disease    Knees  . Dementia ([place])   . Depression   . Diabetes mellitus    takes Metformin daily  .  Gastroesophageal reflux disease    takes Omeprazole daily  . Glaucoma   . Glaucoma    uses eye drops at night  . History of blood transfusion 1969  . History of gout    was on medication but taken off of several months ago  . Hx of colonic polyps   . Hx of migraines    last one about a yr ago;takes Topamax daily  . Hyperlipidemia    takes Pravastatin daily  . Hypertension    takes Hyzaar daily and Propranolol   . Insomnia    takes Restoril prn  . Joint pain   . Joint swelling   . Memory loss    takes donepezil  . Migraine   . Migraine   . Nocturia   . Overweight(278.02)   . Pneumonia 1969   hx of  . Pulmonary embolism [facility]) May 2012   acute presentation, bilateral PE  . Skin spots-aging   . Urinary frequency     Past Surgical History:  Procedure Laterality Date  . CATARACT EXTRACTION W/PHACO Right 04/11/2012   Procedure: CATARACT EXTRACTION PHACO AND INTRAOCULAR LENS PLACEMENT (IOC);  Surgeon: Elta Guadeloupe T. Gershon Crane, MD;  Location: [facility];  Service: Ophthalmology;  Laterality: Right;  CDE=9.61  . CATARACT EXTRACTION W/PHACO Left 12/26/2012   Procedure: CATARACT EXTRACTION PHACO AND INTRAOCULAR LENS PLACEMENT (IOC);  Surgeon: Elta Guadeloupe T. Gershon Crane, MD;  Location: [facility];  Service: Ophthalmology;  Laterality: Left;  CDE:7.74  . COLONOSCOPY  Jan 2002; 2012   2002: Dr. Tamala Julian, ext.  hemorrhoids, nl colon; 2012-adenomatous polyps, gastritis on EGD  . CYSTOSCOPY W/ URETERAL STENT PLACEMENT Bilateral 02/25/2018   Procedure: CYSTOSCOPY WITH BILATERAL RETROGRADE PYELOGRAM/BILATERAL URETERAL STENT PLACEMENT, BLADDER BIOPSIES WITH FULGERATION;  Surgeon: [name], MD;  Location: [facility];  Service: Urology;  Laterality: Bilateral;  . CYSTOSCOPY/URETEROSCOPY/HOLMIUM LASER/STENT PLACEMENT Bilateral 03/21/2018   Procedure: CYSTOSCOPY/URETEROSCOPY/HOLMIUM LASER/STENT PLACEMENT;  Surgeon: [name], MD;  Location: [facility];  Service: Urology;  Laterality: Bilateral;  ONLY  NEEDS 60 MIN  . DILATION AND CURETTAGE OF UTERUS    . ESOPHAGOGASTRODUODENOSCOPY  08/24/2011   ESOPHAGOGASTRODUODENOSCOPY; esophageal dilatation; Rogene Houston, MD;  . Freda Munro CAPSULE STUDY  08/18/2011   Procedure: GIVENS CAPSULE STUDY;  Surgeon: Rogene Houston, MD;  Location: [facility];  Service: Endoscopy;  Laterality: N/A;  730  . KNEE ARTHROSCOPY W/ MENISCECTOMY  90's   Left  . ORIF ANKLE FRACTURE Right 90's  . TONSILLECTOMY    . TOTAL KNEE ARTHROPLASTY  10/20/2011   Procedure: TOTAL KNEE ARTHROPLASTY;  Surgeon: Ninetta Lights, MD;  Location: [facility];  Service: Orthopedics;  Laterality: Left;  . UPPER GASTROINTESTINAL ENDOSCOPY    . URETHRAL DILATION      Prior to Admission medications   Medication Sig Start Date End Date Taking? Authorizing Provider  albuterol (VENTOLIN HFA) 108 (90 Base) MCG/ACT inhaler Inhale 2 puffs into the lungs every 6 (six) hours as needed for wheezing or shortness of breath. 02/27/18  Yes Emokpae, Courage, MD  amLODipine (NORVASC) 10 MG tablet Take 1 tablet (10 mg total) by mouth daily. 02/27/18  Yes Emokpae, Courage, MD  atorvastatin (LIPITOR) 40 MG tablet Take 1 tablet (40 mg total) by mouth daily. 04/19/19  Yes [name], MD  busPIRone (BUSPAR) 7.5 MG tablet Take 1 tablet (7.5 mg total) by mouth 3 (three) times daily. 04/19/19  Yes [name], MD  cloNIDine (CATAPRES) 0.2 MG tablet Take 1 tablet (0.2 mg total) by mouth at bedtime. 04/10/18  Yes [name], MD  clotrimazole-betamethasone (LOTRISONE) cream Apply 1 application topically 2 (two) times daily. D/c vusion ointment 04/19/19  Yes [name], MD  colchicine 0.6 MG tablet Take 1 tablet (0.6 mg total) by mouth daily. May decrease to once per day as needed for diarrhea Patient taking differently: Take 0.6 mg by mouth daily as needed (for gout pain relief).  06/27/17  Yes Tanna Furry, MD  COMBIGAN 0.2-0.5 % ophthalmic solution Place 1 drop into both eyes 2 (two) times daily.  03/23/17  Yes [provider]  conjugated estrogens (PREMARIN) vaginal cream Place vaginally 2 (two) times a week. Apply sparingly ( fingertip)  Twice weekly to vaginal area on Wednesdays and Fridays 01/30/18  Yes Johnson, Clanford L, MD  docusate sodium (COLACE) 100 MG capsule Take 1 capsule (100 mg total) by mouth daily as needed for mild constipation. Patient taking differently: Take 100 mg by mouth daily.  03/20/18  Yes Hosie Poisson, MD  donepezil (ARICEPT) 10 MG tablet Take 1 tablet (10 mg total) by mouth at bedtime. 04/19/19  Yes [name], MD  Fluoxetine HCl, PMDD, 10 MG TABS Take 1 tablet (10 mg total) by mouth daily. 01/27/18  Yes Johnson, Clanford L, MD  hydrOXYzine (VISTARIL) 25 MG capsule Take 1 capsule (25 mg total) by mouth 3 (three) times daily. 04/19/19  Yes [name], MD  imipramine (TOFRANIL) 50 MG tablet TAKE 2 TABLETS (100 MG TOTAL) BY MOUTH AT BEDTIME. 04/19/19  Yes [name],  MD  insulin NPH-regular Human (NOVOLIN 70/30) (70-30) 100 UNIT/ML injection Inject 40 Units into the skin 2 (two) times daily with a meal. 09/27/18  Yes [name], MD  meclizine (ANTIVERT) 25 MG tablet TAKE 1 TABLET THREE TIMES DAILY AS NEEDED FOR DIZZINESS. Patient taking differently: Take 25 mg by mouth 3 (three) times daily as needed for dizziness.  08/07/18  Yes [name], MD  montelukast (SINGULAIR) 10 MG tablet TAKE 1 TABLET BY MOUTH EVERYDAY AT BEDTIME 10/22/18  Yes Perlie Mayo, NP  nystatin (MYCOSTATIN/NYSTOP) powder Apply topically daily as needed (for rash under breasts). 12/04/18  Yes [name], MD  olopatadine (PATANOL) 0.1 % ophthalmic solution Place 1 drop into both eyes 2 (two) times daily. 01/27/18  Yes Johnson, Clanford L, MD  omeprazole (PRILOSEC) 40 MG capsule Take 1 capsule (40 mg total) by mouth 2 (two) times daily. 04/19/19  Yes [name], MD  oxyCODONE-acetaminophen (PERCOCET) 10-325 MG tablet Take one tablet by mouth  two times daily for pain 02/21/19  Yes [name], MD  polyethylene glycol [facility] / GLYCOLAX) packet Take 17 g by mouth daily. Patient taking differently: Take 17 g by mouth daily as needed for mild constipation or moderate constipation.  03/21/18  Yes Hosie Poisson, MD  PRADAXA 150 MG CAPS capsule TAKE 1 CAPSULE BY MOUTH EVERY 12 (TWELVE) HOURS. Patient taking differently: Take 150 mg by mouth every 12 (twelve) hours.  02/28/19  Yes Branch, Alphonse Guild, MD  SUMAtriptan (IMITREX) 25 MG tablet Take 1 tablet (25 mg total) by mouth every 2 (two) hours as needed for migraine. May repeat in 2 hours if headache persists or recurs. 12/04/18  Yes [name], MD  SUMAtriptan Dellis Filbert) 5 MG/ACT nasal spray Place 1 spray into the nose every 2 (two) hours as needed for migraine.  01/16/17  Yes [provider]  temazepam (RESTORIL) 30 MG capsule Take 1 capsule (30 mg total) by mouth at bedtime as needed for sleep. 12/04/18  Yes [name], MD  topiramate (TOPAMAX) 100 MG tablet Take 1 tablet (100 mg total) by mouth 2 (two) times daily. 04/19/19  Yes [name], MD  Vitamin D, Ergocalciferol, (DRISDOL) 50000 units CAPS capsule TAKE 1 CAPSULE (50,000 UNITS TOTAL) BY MOUTH ONCE A WEEK. Patient taking differently: Take 50,000 Units by mouth every 7 (seven) days. Saturday 11/18/17  Yes [name], MD  acetaminophen (TYLENOL) 500 MG tablet Take one tablet two times daily for chronic pain Patient taking differently: Take 500 mg by mouth in the morning and at bedtime. for chronic pain 04/19/19   [name], MD    Current Facility-Administered Medications  Medication Dose Route Frequency Provider Last Rate Last Admin  . 0.9 %  sodium chloride infusion (Manually program via Guardrails IV Fluids)   Intravenous Once Johnson, Clanford L, MD      . 0.9 %  sodium chloride infusion  250 mL Intravenous PRN Johnson, Clanford L, MD      . acetaminophen (TYLENOL) tablet 650 mg   650 mg Oral Q6H PRN Johnson, Clanford L, MD       Or  . acetaminophen (TYLENOL) suppository 650 mg  650 mg Rectal Q6H PRN Johnson, Clanford L, MD      . albuterol (PROVENTIL) (2.5 MG/3ML) 0.083% nebulizer solution 3 mL  3 mL Inhalation Q6H PRN Johnson, Clanford L, MD      . amLODipine (NORVASC) tablet 10 mg  10 mg Oral  Daily Wynetta Emery, Clanford L, MD   10 mg at 04/21/19 [id number]  . atorvastatin (LIPITOR) tablet 40 mg  40 mg Oral Daily Johnson, Clanford L, MD   40 mg at 04/21/19 [id number]  . brimonidine (ALPHAGAN) 0.2 % ophthalmic solution 1 drop  1 drop Both Eyes BID Minda Ditto, RPH   1 drop at 04/21/19 0931   And  . timolol (TIMOPTIC) 0.5 % ophthalmic solution 1 drop  1 drop Both Eyes BID Minda Ditto, RPH   1 drop at 04/21/19 0931  . busPIRone (BUSPAR) tablet 7.5 mg  7.5 mg Oral TID Wynetta Emery, Clanford L, MD   7.5 mg at 04/21/19 0924  . cloNIDine (CATAPRES) tablet 0.2 mg  0.2 mg Oral QHS Johnson, Clanford L, MD   0.2 mg at 04/20/19 2147  . colchicine tablet 0.6 mg  0.6 mg Oral Daily PRN Johnson, Clanford L, MD      . docusate sodium (COLACE) capsule 100 mg  100 mg Oral Daily Johnson, Clanford L, MD   100 mg at 04/21/19 0924  . donepezil (ARICEPT) tablet 10 mg  10 mg Oral QHS Johnson, Clanford L, MD   10 mg at 04/20/19 2148  . FLUoxetine (PROZAC) capsule 10 mg  10 mg Oral Daily Johnson, Clanford L, MD   10 mg at 04/21/19 [id number]  . hydrOXYzine (ATARAX/VISTARIL) tablet 25 mg  25 mg Oral TID PRN Johnson, Clanford L, MD      . imipramine (TOFRANIL) tablet 100 mg  100 mg Oral QHS Johnson, Clanford L, MD   100 mg at 04/20/19 2148  . insulin aspart (novoLOG) injection 0-5 Units  0-5 Units Subcutaneous QHS Johnson, Clanford L, MD      . insulin aspart (novoLOG) injection 0-9 Units  0-9 Units Subcutaneous TID WC Johnson, Clanford L, MD      . insulin aspart protamine- aspart (NOVOLOG MIX 70/30) injection 10 Units  10 Units Subcutaneous BID WC Wynetta Emery, Clanford L, MD   10 Units at 04/21/19 0927  . montelukast  (SINGULAIR) tablet 10 mg  10 mg Oral QHS Johnson, Clanford L, MD   10 mg at 04/20/19 2147  . olopatadine (PATANOL) 0.1 % ophthalmic solution 1 drop  1 drop Both Eyes BID PRN Irwin Brakeman L, MD   1 drop at 04/21/19 0928  . ondansetron (ZOFRAN) tablet 4 mg  4 mg Oral Q6H PRN Johnson, Clanford L, MD       Or  . ondansetron (ZOFRAN) injection 4 mg  4 mg Intravenous Q6H PRN Johnson, Clanford L, MD      . oxyCODONE-acetaminophen (PERCOCET/ROXICET) 5-325 MG per tablet 1 tablet  1 tablet Oral Q12H PRN Wynetta Emery, Clanford L, MD   1 tablet at 04/21/19 [id number]   And  . oxyCODONE (Oxy IR/ROXICODONE) immediate release tablet 5 mg  5 mg Oral Q12H PRN Johnson, Clanford L, MD      . pantoprazole (PROTONIX) injection 40 mg  40 mg Intravenous Q12H Johnson, Clanford L, MD   40 mg at 04/21/19 0925  . polyethylene glycol (MIRALAX / GLYCOLAX) packet 17 g  17 g Oral Daily PRN Johnson, Clanford L, MD      . sodium chloride flush (NS) 0.9 % injection 3 mL  3 mL Intravenous Q12H Johnson, Clanford L, MD   3 mL at 04/21/19 0926  . sodium chloride flush (NS) 0.9 % injection 3 mL  3 mL Intravenous PRN Johnson, Clanford L, MD      . topiramate (TOPAMAX) tablet 100 mg  100 mg Oral BID Wynetta Emery, Clanford L, MD   100 mg at 04/21/19 [id number]   Facility-Administered Medications Ordered in Other Encounters  Medication Dose Route Frequency Provider Last Rate Last Admin  . fentaNYL (SUBLIMAZE) injection 25-50 mcg  25-50 mcg Intravenous Q5 min PRN Lerry Liner, MD        Allergies as of 04/20/2019 - Review Complete 04/20/2019  Allergen Reaction Noted  . Penicillins Itching 11/16/2013  . Fentanyl Itching 08/09/2011  . Sulfonamide derivatives Hives 02/03/2007  . Codeine Itching and Rash     Family History  Problem Relation Age of Onset  . Diabetes Mother   . Hypertension Mother   . Arthritis Mother   . Heart failure Mother   . Migraines Mother   . Kidney failure Mother   . Leukemia Father   . Migraines Father   . Kidney  failure Father   . Diabetes Sister   . Hypertension Sister   . Diabetes Brother   . Hypertension Brother   . Hypertension Brother   . Hypertension Brother   . Hypertension Brother   . Diabetes Brother   . Diabetes Brother   . Diabetes Brother   . Pulmonary embolism Sister   . Migraines Sister   . Kidney failure Brother   . Colon cancer Neg Hx   . Colon polyps Neg Hx     Social History   Socioeconomic History  . Marital status: Widowed    Spouse name: Not on file  . Number of children: 2  . Years of education: 60  . Highest education level: Not on file  Occupational History  . 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 Smoking status: Never Smoker  . Smokeless tobacco: Never Used  Substance and Sexual Activity  . Alcohol use: No  . Drug use: No  . Sexual activity: Not Currently    Birth control/protection: Post-menopausal  Other Topics Concern  . Not on file  Social History Narrative   Lives at home with husband.   Right-handed.   1 cup caffeine per day.   Social Determinants of Health   Financial Resource Strain:   . Difficulty of Paying Living Expenses: Not on file  Food Insecurity:   . Worried About Charity fundraiser in the Last Year: Not on file  . Ran Out of Food in the Last Year: Not on file  Transportation Needs:   . Lack of Transportation (Medical): Not on file  . Lack of Transportation (Non-Medical): Not on file  Physical Activity:   . Days of Exercise per Week: Not on file  . Minutes of Exercise per Session: Not on file  Stress:   . Feeling of Stress : Not on file  Social Connections:   . Frequency of Communication with Friends and Family: Not on file  . Frequency of Social Gatherings with Friends and Family: Not on file  . Attends Religious Services: Not on file  . Active Member of Clubs or Organizations: Not on file  . Attends Archivist Meetings: Not on file  . Marital Status: Not  on file  Intimate Partner Violence:   . Fear of Current or Ex-Partner: Not on file  . Emotionally Abused: Not on file  . Physically Abused: Not on file  . Sexually Abused: Not on file    Review of Systems: As in history of present illness. Physical Exam: Vital signs in last 24  hours: Temp:  [97.3 F (36.3 C)-98.4 F (36.9 C)] 97.5 F (36.4 C) (03/06 0315) Pulse Rate:  [10-106] 72 (03/06 0315) Resp:  [16-24] 18 (03/06 0315) BP: (113-167)/(60-105) 138/66 (03/06 0315) SpO2:  [97 %-100 %] 99 % (03/06 0700) Weight:  [106.7 kg] 106.7 kg (03/05 1820) Last BM Date: 04/19/19 General:   Alert, very pleasant conversant  and cooperative in NAD Lungs:  Clear throughout to auscultation.   No wheezes, crackles, or rhonchi. No acute distress. Heart:  Regular rate and rhythm; no murmurs, clicks, rubs,  or gallops. Abdomen:  Soft, nontender and nondistended. No masses, hepatosplenomegaly or hernias noted. Normal bowel sounds, without guarding, and without rebound.    Intake/Output from previous day: 03/05 0701 - 03/06 0700 In: 1163.2 [I.V.:31.2; Blood:632; IV Piggyback:500] Out: 7700 [Urine:7700] Intake/Output this shift: No intake/output data recorded.  Lab Results: Recent Labs    04/19/19 1152 04/20/19 1254 04/21/19 0610  WBC 9.4 9.6 9.2  HGB 7.0* 6.8* 8.6*  HCT 25.5* 26.3* 30.8*  PLT 493* 475* 391   BMET Recent Labs    04/19/19 1152 04/20/19 1254 04/21/19 0610  NA 135 135 137  K 4.0 3.7 3.9  CL 103 105 106  CO2 22 22 22   GLUCOSE 167* 144* 176*  BUN 17 17 12   CREATININE 1.12* 1.34* 1.24*  CALCIUM 9.3 8.9 8.6*   LFT Recent Labs    04/21/19 0610  PROT 7.1  ALBUMIN 3.3*  AST 14*  ALT 11  ALKPHOS 91  BILITOT 0.3   PT/INR Recent Labs    04/20/19 1254  LABPROT 16.0*  INR 1.3*     Impression: Pleasant 79 year old lady with multiple comorbidities including atrial fibrillation on chronic anticoagulation (Pradaxa) admitted to the hospital with acute on chronic  anemia -  occult blood positive.  Profound iron deficiency.  Intermittent esophageal dysphagia. GI work-up back in 2000 01/2012 revealed non-H. pylori gastritis possible peptic stricture dilated.  Multiple simple colonic adenomas and diverticulosis. Capsule study performed revealed fresh blood in the proximal duodenum. In this setting, may have recurrent bleeding secondary to AVMs while anticoagulated.  Other pathology in her upper GI tract may have crept up since her prior evaluation. Multiple colonic adenomas removed from her colon nearly 10 years ago.  Pradaxa held on admission.  Recommendations: I have offered the patient a EGD with possible esophageal dilation tomorrow. She is on multiple medications but did well with conscious sedation for both her EGD and colonoscopy previously.  She is now 60 years older.  I feel she will do well with conscious sedation once again. The risks, benefits, limitations, alternatives and imponderables have been reviewed with the patient. Potential for esophageal dilation, biopsy, etc. have also been reviewed.  Questions have been answered. All parties agreeable.       Notice:  This dictation was prepared with Dragon dictation along with smaller phrase technology. Any transcriptional errors that result from this process are unintentional and may not be corrected upon review.

## 2019-04-21 NOTE — Plan of Care (Signed)
  Problem: Education: Goal: Knowledge of General Education information will improve Description Including pain rating scale, medication(s)/side effects and non-pharmacologic comfort measures Outcome: Progressing   Problem: Health Behavior/Discharge Planning: Goal: Ability to manage health-related needs will improve Outcome: Progressing   

## 2019-04-21 NOTE — Progress Notes (Signed)
PROGRESS NOTE [facility]   [name]  [id number]  DOB: 18-Sep-1940  DOA: 04/20/2019 PCP: [name], MD   Brief Admission Hx: 79 y.o. female with chronic atrial fibrillation fully anticoagulated with Pradaxa, history of iron deficiency anemia, history of duodenal bleeding, chronic back pain, chronic osteoarthritis, dementia, type 2 diabetes mellitus has seen her PCP recently with complaints of progressive weakness and fatigue.  MDM/Assessment & Plan:   1. Acute on chronic blood loss anemia - Hg has been trending down since last Hg of 7.9 in Sept 2020.  Patient has Hemoccult positive stools.  She is also fully anticoagulated on Pradaxa.  The Pradaxa has been placed on hold temporarily while we are evaluating the acute bleed.  The patient is being treated with packed red blood cell transfusion 2 units and will be monitored closely on telemetry.  I have requested a GI consultation given that she has had duodenal lesions in the past that have been known to bleed.    Spoke with Dr. [name] and he plans to see her today. 2. Chronic atrial fibrillation-patient is rate controlled at this time.  She is fully anticoagulated with Pradaxa which is temporarily being held as noted above. 3. Stage IIIb CKD-currently stable we will continue to monitor. 4. Type 2 diabetes mellitus with neurological complications-supplemental sliding scale coverage ordered and CBG testing ordered.  Poorly controlled disease as evidenced by hemoglobin A1c of 8.0%. 5. Hypoglycemia from insulin-I have reduced her 70/30 insulin significantly to avoid low blood glucose. 6. History of vitamin D deficiency-vitamin D level is now within normal limits. 7. Chronic pain-secondary to back pain and diffuse osteoarthritis controlled with home pain medications as needed. 8. 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 9. GERD-IV Protonix 40 mg twice daily  ordered for GI protection. 10. Iron deficiency anemia secondary to chronic blood loss-iron level very low at 7. 11. History of pulmonary emboli-patient has a history of bilateral pulmonary emboli.  She is currently fully anticoagulated with Pradaxa, this is currently on hold and SCDs have been ordered for DVT prophylaxis. 12. Dementia and memory loss-delirium precautions. 13. Essential hypertension-temporarily holding home blood pressure medications in the setting of soft blood pressures.  Will follow. 14. DNR present on admission-we will continue to honor her wishes while in hospital.   DVT Prophylaxis: SCDs Code Status: DNR Family Communication: Niece at bedside 3/5, call to sister [name] 3/6 Disposition Plan: Presenting from home she has 24/7 caregivers in place and anticipate after work-up for GI bleeding blood transfusion and GI consultation likely would return home when medically stable  Consultants:  GI  Procedures:    Antimicrobials:     Subjective: Pt says she feels better after blood transfusion.  Patient says she has not seen any blood in her stool and she has not been vomiting up any blood since she was admitted.  Patient complains of neck pain in the muscles of the back of the neck.  Objective: Vitals:   04/21/19 0103 04/21/19 0315 04/21/19 0700 04/21/19 1328  BP: 125/68 138/66  132/60  Pulse: 70 72  76  Resp: 19 18  18   Temp: (!) 97.3 F (36.3 C) (!) 97.5 F (36.4 C)  97.8 F (36.6 C)  TempSrc: Oral Oral  Oral  SpO2: 100% 100% 99% 100%  Weight:      Height:        Intake/Output Summary (Last 24 hours) at 04/21/2019 1349 Last  data filed at 04/21/2019 1300 Gross per 24 hour  Intake 2003.17 ml  Output 7700 ml  Net -5696.83 ml   Filed Weights   04/20/19 1118 04/20/19 1820  Weight: 106.6 kg 106.7 kg     REVIEW OF SYSTEMS  As per history otherwise all reviewed and reported negative  Exam:  General exam: Elderly female overweight lying in the bed  she is awake alert Respiratory system: Clear. No increased work of breathing. Cardiovascular system: S1 & S2 heard. No JVD, murmurs, gallops, clicks or pedal edema. Gastrointestinal system: Abdomen is nondistended, soft and nontender. Normal bowel sounds heard. Central nervous system: Alert and oriented. No focal neurological deficits. Extremities: no cyanosis or clubbing.  Data Reviewed: Basic Metabolic Panel: Recent Labs  Lab 04/19/19 1152 04/20/19 1254 04/21/19 0610  NA 135 135 137  K 4.0 3.7 3.9  CL 103 105 106  CO2 22 22 22   GLUCOSE 167* 144* 176*  BUN 17 17 12   CREATININE 1.12* 1.34* 1.24*  CALCIUM 9.3 8.9 8.6*  MG  --   --  2.1   Liver Function Tests: Recent Labs  Lab 04/20/19 1254 04/21/19 0610  AST 14* 14*  ALT 13 11  ALKPHOS 101 91  BILITOT 0.4 0.3  PROT 7.7 7.1  ALBUMIN 3.7 3.3*   No results for input(s): LIPASE, AMYLASE in the last 168 hours. No results for input(s): AMMONIA in the last 168 hours. CBC: Recent Labs  Lab 04/19/19 1152 04/20/19 1254 04/21/19 0610  WBC 9.4 9.6 9.2  NEUTROABS  --  7.4  --   HGB 7.0* 6.8* 8.6*  HCT 25.5* 26.3* 30.8*  MCV 62.5* 68.0* 73.9*  PLT 493* 475* 391   Cardiac Enzymes: No results for input(s): CKTOTAL, CKMB, CKMBINDEX, TROPONINI in the last 168 hours. CBG (last 3)  Recent Labs    04/21/19 0556 04/21/19 0829 04/21/19 1106  GLUCAP 154* 115* 134*   No results found for this or any previous visit (from the past 240 hour(s)).   Studies: No results found.   Scheduled Meds: . sodium chloride   Intravenous Once  . amLODipine  10 mg Oral Daily  . atorvastatin  40 mg Oral Daily  . brimonidine  1 drop Both Eyes BID   And  . timolol  1 drop Both Eyes BID  . busPIRone  7.5 mg Oral TID  . cloNIDine  0.2 mg Oral QHS  . docusate sodium  100 mg Oral Daily  . donepezil  10 mg Oral QHS  . FLUoxetine  10 mg Oral Daily  . imipramine  100 mg Oral QHS  . insulin aspart  0-5 Units Subcutaneous QHS  . insulin  aspart  0-9 Units Subcutaneous TID WC  . insulin aspart protamine- aspart  10 Units Subcutaneous BID WC  . montelukast  10 mg Oral QHS  . pantoprazole (PROTONIX) IV  40 mg Intravenous Q12H  . sodium chloride flush  3 mL Intravenous Q12H  . topiramate  100 mg Oral BID   Continuous Infusions: . sodium chloride      Active Problems:   Acute GI bleeding   Dyslipidemia   Essential hypertension   Gastroesophageal reflux disease   Anemia, iron deficiency   Memory loss   Depression with anxiety   Vitamin D deficiency   Metabolic syndrome X   Chronic anticoagulation   Type 2 diabetes mellitus with neurological complications (HCC)   Chronic bronchitis (HCC)   Chronic pain syndrome   History of pulmonary embolism  Generalized osteoarthritis   CKD stage G3b/A1, GFR 30-44 and albumin creatinine ratio <30 mg/g   Time spent:   Irwin Brakeman, MD [facility] 04/21/2019, 1:49 PM    LOS: 1 day  How to contact the [facility] Attending or Consulting provider [place] or covering provider during after hours [place], for this patient?  1. Check the care team in [facility] and look for a) attending/consulting TRH provider listed and b) the [facility] team listed 2. Log into [email or web address] and use [place]'s universal password to access. If you do not have the password, please contact the hospital operator. 3. Locate the [facility] provider you are looking for under [facility] and page to a number that you can be directly reached. 4. If you still have difficulty reaching the provider, please page the [facility] (Director on Call) for the Hospitalists listed on amion for assistance.

## 2019-04-22 ENCOUNTER — Encounter (HOSPITAL_COMMUNITY)
Admission: EM | Disposition: A | Discharge status: Home / Self Care | Payer: Self-pay | Source: Home / Self Care | Attending: Family Medicine

## 2019-04-22 ENCOUNTER — Encounter (HOSPITAL_COMMUNITY): Payer: Self-pay | Admitting: Family Medicine

## 2019-04-22 ENCOUNTER — Encounter: Payer: Self-pay | Admitting: Internal Medicine

## 2019-04-22 HISTORY — PX: ESOPHAGOGASTRODUODENOSCOPY: SHX5428

## 2019-04-22 HISTORY — PX: BIOPSY: SHX5522

## 2019-04-22 HISTORY — PX: MALONEY DILATION: SHX5535

## 2019-04-22 LAB — TYPE AND SCREEN
ABO/RH(D): AB POS
Antibody Screen: NEGATIVE
Unit division: 0
Unit division: 0

## 2019-04-22 LAB — CBC
HCT: 32.1 % — ABNORMAL LOW (ref 36.0–46.0)
Hemoglobin: 8.7 g/dL — ABNORMAL LOW (ref 12.0–15.0)
MCH: 19.8 pg — ABNORMAL LOW (ref 26.0–34.0)
MCHC: 27.1 g/dL — ABNORMAL LOW (ref 30.0–36.0)
MCV: 73 fL — ABNORMAL LOW (ref 80.0–100.0)
Platelets: 395 10*3/uL (ref 150–400)
RBC: 4.4 MIL/uL (ref 3.87–5.11)
RDW: 23.9 % — ABNORMAL HIGH (ref 11.5–15.5)
WBC: 7 10*3/uL (ref 4.0–10.5)
nRBC: 0 % (ref 0.0–0.2)

## 2019-04-22 LAB — COMPREHENSIVE METABOLIC PANEL
ALT: 10 U/L (ref 0–44)
AST: 10 U/L — ABNORMAL LOW (ref 15–41)
Albumin: 3.2 g/dL — ABNORMAL LOW (ref 3.5–5.0)
Alkaline Phosphatase: 96 U/L (ref 38–126)
Anion gap: 6 (ref 5–15)
BUN: 13 mg/dL (ref 8–23)
CO2: 23 mmol/L (ref 22–32)
Calcium: 8.9 mg/dL (ref 8.9–10.3)
Chloride: 108 mmol/L (ref 98–111)
Creatinine, Ser: 1.14 mg/dL — ABNORMAL HIGH (ref 0.44–1.00)
GFR calc Af Amer: 53 mL/min — ABNORMAL LOW (ref >60)
GFR calc non Af Amer: 46 mL/min — ABNORMAL LOW (ref >60)
Glucose, Bld: 144 mg/dL — ABNORMAL HIGH (ref 70–99)
Potassium: 3.9 mmol/L (ref 3.5–5.1)
Sodium: 137 mmol/L (ref 135–145)
Total Bilirubin: 0.4 mg/dL (ref 0.3–1.2)
Total Protein: 7 g/dL (ref 6.5–8.1)

## 2019-04-22 LAB — BPAM RBC
Blood Product Expiration Date: 202103292359
Blood Product Expiration Date: 202104012359
ISSUE DATE / TIME: 202103051630
ISSUE DATE / TIME: 202103060038
Unit Type and Rh: 6200
Unit Type and Rh: 6200

## 2019-04-22 LAB — GLUCOSE, CAPILLARY
Glucose-Capillary: 139 mg/dL — ABNORMAL HIGH (ref 70–99)
Glucose-Capillary: 142 mg/dL — ABNORMAL HIGH (ref 70–99)
Glucose-Capillary: 142 mg/dL — ABNORMAL HIGH (ref 70–99)
Glucose-Capillary: 132 mg/dL — ABNORMAL HIGH (ref 70–99)
Glucose-Capillary: 164 mg/dL — ABNORMAL HIGH (ref 70–99)
Glucose-Capillary: 159 mg/dL — ABNORMAL HIGH (ref 70–99)

## 2019-04-22 LAB — MAGNESIUM: Magnesium: 2.1 mg/dL (ref 1.7–2.4)

## 2019-04-22 LAB — RESPIRATORY PANEL BY RT PCR (FLU A&B, COVID)
Influenza A by PCR: NEGATIVE
Influenza B by PCR: NEGATIVE
SARS Coronavirus 2 by RT PCR: NEGATIVE

## 2019-04-22 SURGERY — EGD (ESOPHAGOGASTRODUODENOSCOPY)
Anesthesia: Moderate Sedation

## 2019-04-22 MED ORDER — LIDOCAINE VISCOUS HCL 2 % MT SOLN
OROMUCOSAL | Status: DC | PRN
  Administered 2019-04-22: 4 mL via OROMUCOSAL

## 2019-04-22 MED ORDER — MEPERIDINE HCL 100 MG/ML IJ SOLN
INTRAMUSCULAR | Status: DC | PRN
  Administered 2019-04-22 (×3): 12.5 mg via INTRAVENOUS

## 2019-04-22 MED ORDER — MEPERIDINE HCL 50 MG/ML IJ SOLN
INTRAMUSCULAR | Status: AC
  Filled 2019-04-22: qty 1

## 2019-04-22 MED ORDER — LIDOCAINE VISCOUS HCL 2 % MT SOLN
OROMUCOSAL | Status: AC
  Filled 2019-04-22: qty 15

## 2019-04-22 MED ORDER — MIDAZOLAM HCL 5 MG/5ML IJ SOLN
INTRAMUSCULAR | Status: AC
  Filled 2019-04-22: qty 10

## 2019-04-22 MED ORDER — MIDAZOLAM HCL 5 MG/5ML IJ SOLN
INTRAMUSCULAR | Status: DC | PRN
  Administered 2019-04-22 (×5): 1 mg via INTRAVENOUS

## 2019-04-22 MED ORDER — ONDANSETRON HCL 4 MG/2ML IJ SOLN
INTRAMUSCULAR | Status: DC | PRN
  Administered 2019-04-22: 4 mg via INTRAVENOUS

## 2019-04-22 MED ORDER — ONDANSETRON HCL 4 MG/2ML IJ SOLN
INTRAMUSCULAR | Status: AC
  Filled 2019-04-22: qty 2

## 2019-04-22 MED ORDER — STERILE WATER FOR IRRIGATION IR SOLN
Status: DC | PRN
  Administered 2019-04-22: 1.5 mL

## 2019-04-22 MED ORDER — HYDRALAZINE HCL 25 MG PO TABS
25.0000 mg | ORAL_TABLET | Freq: Three times a day (TID) | ORAL | Status: DC | PRN
  Administered 2019-04-22: 25 mg via ORAL
  Filled 2019-04-22: qty 1

## 2019-04-22 NOTE — Progress Notes (Signed)
Patient's sister Loistine Simas would like to be contacted first concerning any procedures regarding the patient please. She can be contacted at [phone].

## 2019-04-22 NOTE — Progress Notes (Signed)
PROGRESS NOTE  CAMPUS   HAILE SEABORN  [id number]  DOB: [birth year]  DOA: 04/20/2019 PCP: [name], MD   Brief Admission Hx: 79 y.o. female with chronic atrial fibrillation fully anticoagulated with Pradaxa, history of iron deficiency anemia, history of duodenal bleeding, chronic back pain, chronic osteoarthritis, dementia, type 2 diabetes mellitus has seen her PCP recently with complaints of progressive weakness and fatigue.  MDM/Assessment & Plan:   1. Acute on chronic blood loss anemia - Hg has been trending down since last Hg of 7.9 in Sept 2020.  Patient has Hemoccult positive stools.  She is also fully anticoagulated on Pradaxa.  The Pradaxa has been placed on hold temporarily while we are evaluating the acute bleed.  The patient is being treated with packed red blood cell transfusion 2 units and will be monitored closely on telemetry.  I have requested a GI consultation given that she has had duodenal lesions in the past that have been known to bleed.    Spoke with Dr. [name] and he planning for EGD later today.   2. Chronic atrial fibrillation-patient is rate controlled at this time.  She is fully anticoagulated with Pradaxa which is temporarily being held as noted above. 3. Stage IIIb CKD-currently stable we will continue to monitor. 4. Type 2 diabetes mellitus with neurological complications-supplemental sliding scale coverage ordered and CBG testing ordered.  Poorly controlled disease as evidenced by hemoglobin A1c of 8.0%.   HOLD 70/30 while NPO.  5. Hypoglycemia from insulin-treated and resolved, I have reduced her 70/30 insulin significantly to avoid low blood glucose. 6. History of vitamin D deficiency-vitamin D level is now within normal limits. 7. Chronic pain-secondary to back pain and diffuse osteoarthritis controlled with home pain medications as needed. 8. 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 9. GERD-IV Protonix 40 mg twice daily ordered for GI protection. 10. Iron deficiency anemia secondary to chronic blood loss-iron level very low at 7. 11. History of pulmonary emboli-patient has a history of bilateral pulmonary emboli.  She is currently fully anticoagulated with Pradaxa, this is currently on hold and SCDs have been ordered for DVT prophylaxis. 12. Dementia and memory loss-delirium precautions. 13. Essential hypertension-resume home BP meds when can take P.O.  Will follow. 14. DNR present on admission-we will continue to honor her wishes while in hospital.   DVT Prophylaxis: SCDs Code Status: DNR Family Communication: Niece at bedside 3/5, called and spoke with sister [name] 3/6 Disposition Plan: Presenting from home she has 24/7 caregivers in place and anticipate after work-up for GI bleeding blood transfusion and GI consultation likely would return home when medically stable  Consultants:  GI  Procedures:    Antimicrobials:     Subjective: Pt denies black stool or blood in stool.  No SOB and no CP.   Objective: Vitals:   04/21/19 0700 04/21/19 1328 04/21/19 2134 04/22/19 0529  BP:  132/60 (!) 160/78 (!) 163/81  Pulse:  76 75 70  Resp:  18 20 18   Temp:  97.8 F (36.6 C) 98 F (36.7 C) 98.1 F (36.7 C)  TempSrc:  Oral Oral Oral  SpO2: 99% 100% 100% 100%  Weight:      Height:        Intake/Output Summary (Last 24 hours) at 04/22/2019 1107 Last data filed at 04/21/2019 1730 Gross per 24 hour  Intake 600 ml  Output 700 ml  Net -100 ml   Filed  Weights   04/20/19 1118 04/20/19 1820  Weight: 106.6 kg 106.7 kg   REVIEW OF SYSTEMS  As per history otherwise all reviewed and reported negative  Exam:  General exam: Elderly female overweight lying in the bed she is awake alert Respiratory system: Clear. No increased work of breathing. Cardiovascular system: S1 & S2 heard. No JVD, murmurs, gallops, clicks or pedal edema. Gastrointestinal  system: Abdomen is nondistended, soft and nontender. Normal bowel sounds heard. Central nervous system: Alert and oriented. No focal neurological deficits. Extremities: no cyanosis or clubbing.  Data Reviewed: Basic Metabolic Panel: Recent Labs  Lab 04/19/19 1152 04/20/19 1254 04/21/19 0610 04/22/19 0556  NA 135 135 137 137  K 4.0 3.7 3.9 3.9  CL 103 105 106 108  CO2 22 22 22 23   GLUCOSE 167* 144* 176* 144*  BUN 17 17 12 13   CREATININE 1.12* 1.34* 1.24* 1.14*  CALCIUM 9.3 8.9 8.6* 8.9  MG  --   --  2.1 2.1   Liver Function Tests: Recent Labs  Lab 04/20/19 1254 04/21/19 0610 04/22/19 0556  AST 14* 14* 10*  ALT 13 11 10   ALKPHOS 101 91 96  BILITOT 0.4 0.3 0.4  PROT 7.7 7.1 7.0  ALBUMIN 3.7 3.3* 3.2*   No results for input(s): LIPASE, AMYLASE in the last 168 hours. No results for input(s): AMMONIA in the last 168 hours. CBC: Recent Labs  Lab 04/19/19 1152 04/20/19 1254 04/21/19 0610 04/22/19 0556  WBC 9.4 9.6 9.2 7.0  NEUTROABS  --  7.4  --   --   HGB 7.0* 6.8* 8.6* 8.7*  HCT 25.5* 26.3* 30.8* 32.1*  MCV 62.5* 68.0* 73.9* 73.0*  PLT 493* 475* 391 395   Cardiac Enzymes: No results for input(s): CKTOTAL, CKMB, CKMBINDEX, TROPONINI in the last 168 hours. CBG (last 3)  Recent Labs    04/21/19 2136 04/22/19 0417 04/22/19 0737  GLUCAP 154* 139* 142*   No results found for this or any previous visit (from the past 240 hour(s)).   Studies: No results found.   Scheduled Meds: . sodium chloride   Intravenous Once  . amLODipine  10 mg Oral Daily  . atorvastatin  40 mg Oral Daily  . brimonidine  1 drop Both Eyes BID   And  . timolol  1 drop Both Eyes BID  . busPIRone  7.5 mg Oral TID  . cloNIDine  0.2 mg Oral QHS  . docusate sodium  100 mg Oral Daily  . donepezil  10 mg Oral QHS  . FLUoxetine  10 mg Oral Daily  . imipramine  100 mg Oral QHS  . insulin aspart  0-5 Units Subcutaneous QHS  . insulin aspart  0-9 Units Subcutaneous TID WC  . lidocaine       . meperidine      . midazolam      . montelukast  10 mg Oral QHS  . ondansetron      . pantoprazole (PROTONIX) IV  40 mg Intravenous Q12H  . sodium chloride flush  3 mL Intravenous Q12H  . topiramate  100 mg Oral BID   Continuous Infusions: . sodium chloride      Active Problems:   Acute GI bleeding   Dyslipidemia   Essential hypertension   Gastroesophageal reflux disease   Anemia, iron deficiency   Memory loss   Depression with anxiety   Vitamin D deficiency   Metabolic syndrome X   Chronic anticoagulation   Type 2 diabetes mellitus with neurological  complications (HCC)   Chronic bronchitis (HCC)   Chronic pain syndrome   History of pulmonary embolism   Generalized osteoarthritis   CKD stage G3b/A1, GFR 30-44 and albumin creatinine ratio <30 mg/g   Time spent:   Irwin Brakeman, MD [facility] 04/22/2019, 11:07 AM    LOS: 2 days  How to contact the [facility] Attending or Consulting provider [place] or covering provider during after hours [place], for this patient?  1. Check the care team in [facility] and look for a) attending/consulting TRH provider listed and b) the [facility] team listed 2. Log into [email or web address] and use [place]'s universal password to access. If you do not have the password, please contact the hospital operator. 3. Locate the [facility] provider you are looking for under [facility] and page to a number that you can be directly reached. 4. If you still have difficulty reaching the provider, please page the [facility] (Director on Call) for the Hospitalists listed on amion for assistance.

## 2019-04-22 NOTE — Progress Notes (Signed)
Held AM meds for EGD.

## 2019-04-22 NOTE — Interval H&P Note (Signed)
History and Physical Interval Note:  04/22/2019 11:56 AM  [name]  has presented today for surgery, with the diagnosis of GI bleed/dysphagia.  The various methods of treatment have been discussed with the patient and family. After consideration of risks, benefits and other options for treatment, the patient has consented to  Procedure(s): ESOPHAGOGASTRODUODENOSCOPY (EGD) (N/A) as a surgical intervention.  The patient's history has been reviewed, patient examined, no change in status, stable for surgery.  I have reviewed the patient's chart and labs.  Questions were answered to the patient's satisfaction.     [name]  Patient seen in the endoscopy unit this morning she is stable.  She relates esophageal dysphagia.  I spoke to her daughter [name] at [phone] and to [name], her sister.  [name] endorses esophageal dysphagia and hope that that can be addressed today as well.  Per plan, proceeding with an EGD with esophageal dilation as feasible/appropriate per plan.  The risks, benefits, limitations, alternatives and imponderables have been reviewed with the patient. Potential for esophageal dilation, biopsy, etc. have also been reviewed.  Questions have been answered. All parties agreeable.   Further recommendations to follow.

## 2019-04-22 NOTE — Plan of Care (Signed)
  Problem: Education: Goal: Knowledge of General Education information will improve Description Including pain rating scale, medication(s)/side effects and non-pharmacologic comfort measures Outcome: Progressing   Problem: Health Behavior/Discharge Planning: Goal: Ability to manage health-related needs will improve Outcome: Progressing   

## 2019-04-22 NOTE — Op Note (Addendum)
[facility] Patient Name: [name] Procedure Date: 04/22/2019 10:38 AM MRN: [id number] Date of Birth: 1940-11-07 Attending MD: Norvel Richards , MD CSN: [id number] Age: 79 Admit Type: Inpatient Procedure:                Upper GI endoscopy Indications:              Iron deficiency anemia, Dysphagia, Heme positive                            stool. History of proximal duodenal bleeding. Providers:                Norvel Richards, MD, Jeanann Lewandowsky. Gwenlyn Perking RN, RN,                            Lurline Del, RN Referring MD:              Medicines:                Midazolam 5 mg IV, Meperidine 37.5 mg IV,                            Ondansetron 4 mg IV Complications:            No immediate complications. Estimated Blood Loss:     Estimated blood loss was minimal. Procedure:                Pre-Anesthesia Assessment:                           - Prior to the procedure, a History and Physical                            was performed, and patient medications and                            allergies were reviewed. The patient's tolerance of                            previous anesthesia was also reviewed. The risks                            and benefits of the procedure and the sedation                            options and risks were discussed with the patient.                            All questions were answered, and informed consent                            was obtained. Prior Anticoagulants: The patient                            last took Pradaxa (dabigatran) 3 days prior to the  procedure. ASA Grade Assessment: III - A patient                            with severe systemic disease. After reviewing the                            risks and benefits, the patient was deemed in                            satisfactory condition to undergo the procedure.                           After obtaining informed consent, the endoscope was   passed under direct vision. Throughout the                            procedure, the patient's blood pressure, pulse, and                            oxygen saturations were monitored continuously. The                            [id number] scope was introduced through the                            mouth, and advanced to the fourth part of duodenum.                            The upper GI endoscopy was accomplished without                            difficulty. The patient tolerated the procedure                            well. Scope In: 12:12:15 PM Scope Out: 12:33:08 PM Total Procedure Duration: 0 hours 20 minutes 53 seconds  Findings:      The examined esophagus was normal.      A small hiatal hernia was present. Stomach otherwise appeared normal.      (2) 6 to 21mm polypoid appearing nodules straddling the bulb and second       portion. No bleeding stigmata. Otherwise, the duodenum from the bulb       into the proximal fourth portion appeared normal. The scope was       withdrawn. Dilation was performed with a Maloney dilator with mild       resistance at 56 Fr. The dilation site was examined following endoscope       reinsertion and showed mild mucosal disruption. Estimated blood loss was       minimal. Finally, the 2 duodenal nodules were biopsied. Impression:               - Normal esophagus. Dilated.                           - Small hiatal hernia.                           -  Duodenal polypoid nodules?status post biopsy. No                            convincing lesion in her upper GI tract to explain                            Hemoccult positive stool and degree of iron                            deficiency anemia. Its been nearly 10 years since                            she had a colonoscopy (multiple adenomas removed                            previously). Moderate Sedation:      Moderate (conscious) sedation was administered by the endoscopy nurse       and supervised  by the endoscopist. The following parameters were       monitored: oxygen saturation, heart rate, blood pressure, respiratory       rate, EKG, adequacy of pulmonary ventilation, and response to care.       Total physician intraservice time was 29 minutes. Recommendation:           - Return patient to hospital ward for ongoing care.                           - Clear liquid diet.                           - Continue present medications. May be best to                            consider diagnostic colonoscopy while she is here                            off anticoagulation therapy. We will leave her on a                            clear liquid diet. Dr. Laural Golden to see tomorrow                            morning - will determine further management                            strategy. Follow-up on pathology.                           I've discussed my findings and recommendations with                            patient's sister [name] at [phone] Procedure Code(s):        --- Professional ---  [id number], Esophagogastroduodenoscopy, flexible,                            transoral; diagnostic, including collection of                            specimen(s) by brushing or washing, when performed                            (separate procedure)                           43450, Dilation of esophagus, by unguided sound or                            bougie, single or multiple passes                           99153, Moderate sedation; each additional 15                            minutes intraservice time                           G0500, Moderate sedation services provided by the                            same physician or other qualified health care                            professional performing a gastrointestinal                            endoscopic service that sedation supports,                            requiring the presence of an independent trained                             observer to assist in the monitoring of the                            patient's level of consciousness and physiological                            status; initial 15 minutes of intra-service time;                            patient age 75 years or older (additional time may                            be reported with [phone], as appropriate) Diagnosis Code(s):        --- Professional ---  K44.9, Diaphragmatic hernia without obstruction or                            gangrene                           D50.9, Iron deficiency anemia, unspecified                           R13.10, Dysphagia, unspecified                           R19.5, Other fecal abnormalities CPT copyright 2019 [employer]. All rights reserved. The codes documented in this report are preliminary and upon coder review may  be revised to meet current compliance requirements. Cristopher Estimable. Jonalyn Sedlak, MD Norvel Richards, MD 04/22/2019 12:44:27 PM This report has been signed electronically. Number of Addenda: 0

## 2019-04-23 DIAGNOSIS — D500 Iron deficiency anemia secondary to blood loss (chronic): Secondary | ICD-10-CM

## 2019-04-23 DIAGNOSIS — K922 Gastrointestinal hemorrhage, unspecified: Secondary | ICD-10-CM

## 2019-04-23 DIAGNOSIS — E1149 Type 2 diabetes mellitus with other diabetic neurological complication: Secondary | ICD-10-CM

## 2019-04-23 DIAGNOSIS — Z7901 Long term (current) use of anticoagulants: Secondary | ICD-10-CM

## 2019-04-23 LAB — GLUCOSE, CAPILLARY
Glucose-Capillary: 269 mg/dL — ABNORMAL HIGH (ref 70–99)
Glucose-Capillary: 130 mg/dL — ABNORMAL HIGH (ref 70–99)
Glucose-Capillary: 124 mg/dL — ABNORMAL HIGH (ref 70–99)
Glucose-Capillary: 142 mg/dL — ABNORMAL HIGH (ref 70–99)
Glucose-Capillary: 138 mg/dL — ABNORMAL HIGH (ref 70–99)

## 2019-04-23 LAB — COMPREHENSIVE METABOLIC PANEL
ALT: 10 U/L (ref 0–44)
AST: 10 U/L — ABNORMAL LOW (ref 15–41)
Albumin: 3.4 g/dL — ABNORMAL LOW (ref 3.5–5.0)
Alkaline Phosphatase: 100 U/L (ref 38–126)
Anion gap: 7 (ref 5–15)
BUN: 13 mg/dL (ref 8–23)
CO2: 25 mmol/L (ref 22–32)
Calcium: 8.8 mg/dL — ABNORMAL LOW (ref 8.9–10.3)
Chloride: 106 mmol/L (ref 98–111)
Creatinine, Ser: 1.24 mg/dL — ABNORMAL HIGH (ref 0.44–1.00)
GFR calc Af Amer: 48 mL/min — ABNORMAL LOW (ref >60)
GFR calc non Af Amer: 41 mL/min — ABNORMAL LOW (ref >60)
Glucose, Bld: 209 mg/dL — ABNORMAL HIGH (ref 70–99)
Potassium: 4.1 mmol/L (ref 3.5–5.1)
Sodium: 138 mmol/L (ref 135–145)
Total Bilirubin: 0.4 mg/dL (ref 0.3–1.2)
Total Protein: 7.1 g/dL (ref 6.5–8.1)

## 2019-04-23 LAB — CBC
HCT: 32.8 % — ABNORMAL LOW (ref 36.0–46.0)
Hemoglobin: 9 g/dL — ABNORMAL LOW (ref 12.0–15.0)
MCH: 20.1 pg — ABNORMAL LOW (ref 26.0–34.0)
MCHC: 27.4 g/dL — ABNORMAL LOW (ref 30.0–36.0)
MCV: 73.2 fL — ABNORMAL LOW (ref 80.0–100.0)
Platelets: 423 10*3/uL — ABNORMAL HIGH (ref 150–400)
RBC: 4.48 MIL/uL (ref 3.87–5.11)
RDW: 24.5 % — ABNORMAL HIGH (ref 11.5–15.5)
WBC: 10.8 10*3/uL — ABNORMAL HIGH (ref 4.0–10.5)
nRBC: 0 % (ref 0.0–0.2)

## 2019-04-23 LAB — MAGNESIUM: Magnesium: 2 mg/dL (ref 1.7–2.4)

## 2019-04-23 MED ORDER — MUSCLE RUB 10-15 % EX CREA
1.0000 "application " | TOPICAL_CREAM | CUTANEOUS | Status: DC | PRN
  Administered 2019-04-23: 1 via TOPICAL
  Filled 2019-04-23: qty 85

## 2019-04-23 MED ORDER — BISACODYL 5 MG PO TBEC
10.0000 mg | DELAYED_RELEASE_TABLET | Freq: Once | ORAL | Status: AC
  Administered 2019-04-23: 10 mg via ORAL
  Filled 2019-04-23: qty 2

## 2019-04-23 MED ORDER — PEG 3350-KCL-NA BICARB-NACL 420 G PO SOLR
4000.0000 mL | Freq: Once | ORAL | Status: AC
  Administered 2019-04-23: 4000 mL via ORAL

## 2019-04-23 NOTE — Progress Notes (Signed)
PROGRESS NOTE [facility]   [name]  [id number]  DOB: 02-Aug-1940  DOA: 04/20/2019 PCP: [name], MD   Brief Admission Hx: 79 y.o. female with chronic atrial fibrillation fully anticoagulated with Pradaxa, history of iron deficiency anemia, history of duodenal bleeding, chronic back pain, chronic osteoarthritis, dementia, type 2 diabetes mellitus has seen her PCP recently with complaints of progressive weakness and fatigue.  MDM/Assessment & Plan:   1. Acute on chronic blood loss anemia - Hg has been trending down since last Hg of 7.9 in Sept 2020.  Patient has Hemoccult positive stools.  She is also fully anticoagulated on Pradaxa.  The Pradaxa has been placed on hold temporarily while we are evaluating the acute bleed.  The patient is being treated with packed red blood cell transfusion 2 units and will be monitored closely on telemetry.  I have requested a GI consultation given that she has had duodenal lesions in the past that have been known to bleed.    Spoke with Dr. [name] and pt had EGD 3/7.  Colonoscopy planned for later today.   2. Chronic atrial fibrillation-patient is rate controlled at this time.  She is fully anticoagulated with Pradaxa which is temporarily being held as noted above. 3. Stage IIIb CKD-currently stable we will continue to monitor. 4. Type 2 diabetes mellitus with neurological complications-supplemental sliding scale coverage ordered and CBG testing ordered.  Poorly controlled disease as evidenced by hemoglobin A1c of 8.0%.   HOLD 70/30 while NPO.  5. Hypoglycemia from insulin-treated and resolved, I have reduced her 70/30 insulin significantly to avoid low blood glucose. 6. History of vitamin D deficiency-vitamin D level is now within normal limits. 7. Chronic pain-secondary to back pain and diffuse osteoarthritis controlled with home pain medications as needed. 8. 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 9. GERD-IV Protonix 40 mg twice daily ordered for GI protection. 10. Iron deficiency anemia secondary to chronic blood loss-iron level very low at 7. 11. History of pulmonary emboli-patient has a history of bilateral pulmonary emboli.  She was fully anticoagulated with Pradaxa, this is currently on hold and SCDs have been ordered for DVT prophylaxis. 12. Dementia and memory loss-delirium precautions. 13. Essential hypertension-resume home BP meds when can take P.O.  Will follow. 14. DNR present on admission-we will continue to honor her wishes while in hospital.   DVT Prophylaxis: SCDs Code Status: DNR Family Communication: Niece at bedside 3/5, called and spoke with sister [name] 3/6 Disposition Plan: Presenting from home she has 24/7 caregivers in place and anticipate after work-up for GI bleeding blood transfusion and GI consultation likely would return home when medically stable  Consultants:  GI  Procedures:  EGD 04/23/19 Impression:       - Normal esophagus. Dilated.                           - Small hiatal hernia.                           -Duodenal polypoid nodules?status post biopsy. No                            convincing lesion in her upper GI tract to explain  Hemoccult positive stool and degree of iron                            deficiency anemia. Its been nearly 10 years since                            she had a colonoscopy (multiple adenomas removed                            previously). Moderate Sedation:      Moderate (conscious) sedation was administered by the endoscopy nurse       and supervised by the endoscopist. The following parameters were       monitored: oxygen saturation, heart rate, blood pressure, respiratory       rate, EKG, adequacy of pulmonary ventilation, and response to care.       Total physician intraservice time was 29 minutes. Recommendation: - Return patient to hospital ward for  ongoing care.                           - Clear liquid diet.                           - Continue present medications. May be best to consider diagnostic colonoscopy while she is here                            off anticoagulation therapy. We will leave her on a clear liquid diet. Dr. Laural Golden to see tomorrow                            morning - will determine further management strategy. Follow-up on pathology.                           I've discussed my findings and recommendations with patient's sister [name] at [phone]  Antimicrobials:     Subjective: Pt says she slept well.  No black stool. No n/v/d.  No SOB or chest pain.    Objective: Vitals:   04/22/19 1607 04/22/19 2020 04/23/19 0615 04/23/19 1308  BP: (!) 183/89 (!) 159/86 139/64 (!) 147/92  Pulse: 87 90 72 88  Resp: 16 20 20 18   Temp: 97.7 F (36.5 C) 98.1 F (36.7 C) 98.2 F (36.8 C) 98.2 F (36.8 C)  TempSrc: Axillary Oral Oral Oral  SpO2: 100% 98% 100% 100%  Weight:      Height:        Intake/Output Summary (Last 24 hours) at 04/23/2019 1651 Last data filed at 04/23/2019 1300 Gross per 24 hour  Intake 1200 ml  Output 1000 ml  Net 200 ml   Filed Weights   04/20/19 1118 04/20/19 1820  Weight: 106.6 kg 106.7 kg   REVIEW OF SYSTEMS  As per history otherwise all reviewed and reported negative  Exam:  General exam: Elderly female overweight lying in the bed she is awake alert Respiratory system: Clear. No increased work of breathing. Cardiovascular system: S1 & S2 heard. No JVD, murmurs, gallops, clicks or pedal edema. Gastrointestinal system: Abdomen is nondistended, soft and nontender. Normal bowel sounds heard. Central nervous system: Alert  and oriented. No focal neurological deficits. Extremities: no cyanosis or clubbing.  Data Reviewed: Basic Metabolic Panel: Recent Labs  Lab 04/19/19 1152 04/20/19 1254 04/21/19 0610 04/22/19 0556 04/23/19 0447  NA 135 135 137 137 138  K 4.0 3.7  3.9 3.9 4.1  CL 103 105 106 108 106  CO2 22 22 22 23 25   GLUCOSE 167* 144* 176* 144* 209*  BUN 17 17 12 13 13   CREATININE 1.12* 1.34* 1.24* 1.14* 1.24*  CALCIUM 9.3 8.9 8.6* 8.9 8.8*  MG  --   --  2.1 2.1 2.0   Liver Function Tests: Recent Labs  Lab 04/20/19 1254 04/21/19 0610 04/22/19 0556 04/23/19 0447  AST 14* 14* 10* 10*  ALT 13 11 10 10   ALKPHOS 101 91 96 100  BILITOT 0.4 0.3 0.4 0.4  PROT 7.7 7.1 7.0 7.1  ALBUMIN 3.7 3.3* 3.2* 3.4*   No results for input(s): LIPASE, AMYLASE in the last 168 hours. No results for input(s): AMMONIA in the last 168 hours. CBC: Recent Labs  Lab 04/19/19 1152 04/20/19 1254 04/21/19 0610 04/22/19 0556 04/23/19 0447  WBC 9.4 9.6 9.2 7.0 10.8*  NEUTROABS  --  7.4  --   --   --   HGB 7.0* 6.8* 8.6* 8.7* 9.0*  HCT 25.5* 26.3* 30.8* 32.1* 32.8*  MCV 62.5* 68.0* 73.9* 73.0* 73.2*  PLT 493* 475* 391 395 423*   Cardiac Enzymes: No results for input(s): CKTOTAL, CKMB, CKMBINDEX, TROPONINI in the last 168 hours. CBG (last 3)  Recent Labs    04/23/19 0718 04/23/19 1110 04/23/19 1634  GLUCAP 130* 124* 142*   Recent Results (from the past 240 hour(s))  Respiratory Panel by RT PCR (Flu A&B, Covid) - Nasopharyngeal Swab     Status: None   Collection Time: 04/22/19 10:26 AM   Specimen: Nasopharyngeal Swab  Result Value Ref Range Status   SARS Coronavirus 2 by RT PCR NEGATIVE NEGATIVE Final    Comment: (NOTE) SARS-CoV-2 target nucleic acids are NOT DETECTED. The SARS-CoV-2 RNA is generally detectable in upper respiratoy specimens during the acute phase of infection. The lowest concentration of SARS-CoV-2 viral copies this assay can detect is 131 copies/mL. A negative result does not preclude SARS-Cov-2 infection and should not be used as the sole basis for treatment or other patient management decisions. A negative result may occur with  improper specimen collection/handling, submission of specimen other than nasopharyngeal swab,  presence of viral mutation(s) within the areas targeted by this assay, and inadequate number of viral copies (<131 copies/mL). A negative result must be combined with clinical observations, patient history, and epidemiological information. The expected result is Negative. Fact Sheet for Patients:  PinkCheek.be Fact Sheet for Healthcare Providers:  GravelBags.it This test is not yet ap proved or cleared by the [place] FDA and  has been authorized for detection and/or diagnosis of SARS-CoV-2 by FDA under an Emergency Use Authorization (EUA). This EUA will remain  in effect (meaning this test can be used) for the duration of the COVID-19 declaration under Section 564(b)(1) of the Act, 21 U.S.C. section 360bbb-3(b)(1), unless the authorization is terminated or revoked sooner.    Influenza A by PCR NEGATIVE NEGATIVE Final   Influenza B by PCR NEGATIVE NEGATIVE Final    Comment: (NOTE) The Xpert Xpress SARS-CoV-2/FLU/RSV assay is intended as an aid in  the diagnosis of influenza from Nasopharyngeal swab specimens and  should not be used as a sole basis for treatment. Nasal washings and  aspirates  are unacceptable for Xpert Xpress SARS-CoV-2/FLU/RSV  testing. Fact Sheet for Patients: PinkCheek.be Fact Sheet for Healthcare Providers: GravelBags.it This test is not yet approved or cleared by the [place] FDA and  has been authorized for detection and/or diagnosis of SARS-CoV-2 by  FDA under an Emergency Use Authorization (EUA). This EUA will remain  in effect (meaning this test can be used) for the duration of the  Covid-19 declaration under Section 564(b)(1) of the Act, 21  U.S.C. section 360bbb-3(b)(1), unless the authorization is  terminated or revoked. Performed at [facility], [address] [place], [place] [address]      Studies: No results found.    Scheduled Meds: . sodium chloride   Intravenous Once  . amLODipine  10 mg Oral Daily  . atorvastatin  40 mg Oral Daily  . brimonidine  1 drop Both Eyes BID   And  . timolol  1 drop Both Eyes BID  . busPIRone  7.5 mg Oral TID  . cloNIDine  0.2 mg Oral QHS  . docusate sodium  100 mg Oral Daily  . donepezil  10 mg Oral QHS  . FLUoxetine  10 mg Oral Daily  . imipramine  100 mg Oral QHS  . insulin aspart  0-5 Units Subcutaneous QHS  . insulin aspart  0-9 Units Subcutaneous TID WC  . montelukast  10 mg Oral QHS  . pantoprazole (PROTONIX) IV  40 mg Intravenous Q12H  . sodium chloride flush  3 mL Intravenous Q12H  . topiramate  100 mg Oral BID   Continuous Infusions: . sodium chloride      Active Problems:   Acute GI bleeding   Dyslipidemia   Essential hypertension   Gastroesophageal reflux disease   Anemia, iron deficiency   Memory loss   Depression with anxiety   Vitamin D deficiency   Metabolic syndrome X   Chronic anticoagulation   Type 2 diabetes mellitus with neurological complications (HCC)   Chronic bronchitis (HCC)   Chronic pain syndrome   History of pulmonary embolism   Generalized osteoarthritis   CKD stage G3b/A1, GFR 30-44 and albumin creatinine ratio <30 mg/g   Time spent:   Irwin Brakeman, MD [facility] 04/23/2019, 4:51 PM    LOS: 3 days  How to contact the [facility] Attending or Consulting provider Sevin Langenbach or covering provider during after hours [place], for this patient?  1. Check the care team in [facility] and look for a) attending/consulting [facility] provider listed and b) the [facility] team listed 2. Log into [email or web address] and use [place]'s universal password to access. If you do not have the password, please contact the hospital operator. 3. Locate the [facility] provider you are looking for under [facility] and page to a number that you can be directly reached. 4. If you still have difficulty reaching the provider, please page the [facility] (Director on Call) for  the Hospitalists listed on amion for assistance.

## 2019-04-23 NOTE — Care Management Important Message (Signed)
Important Message  Patient Details  Name: [name] MRN: [id number] Date of Birth: Oct 26, 1940   Medicare Important Message Given:  Yes     Tommy Medal 04/23/2019, 12:07 PM

## 2019-04-23 NOTE — Progress Notes (Addendum)
  Subjective:  Patient complains of headache and neck pain.  She denies chest pain or shortness of breath. Patient is agreeable to proceed with colonoscopy.  Objective: Blood pressure 139/64, pulse 72, temperature 98.2 F (36.8 C), temperature source Oral, resp. rate 20, height _0  (1.702 m), weight 106.7 kg, SpO2 100 %. Patient is alert.  She is in no acute distress. Abdominal examination is benign.  Labs/studies Results:  CBC Latest Ref Rng & Units 04/23/2019 04/22/2019 04/21/2019  WBC 4.0 - 10.5 K/uL 10.8(H) 7.0 9.2  Hemoglobin 12.0 - 15.0 g/dL 9.0(L) 8.7(L) 8.6(L)  Hematocrit 36.0 - 46.0 % 32.8(L) 32.1(L) 30.8(L)  Platelets 150 - 400 K/uL 423(H) 395 391    CMP Latest Ref Rng & Units 04/23/2019 04/22/2019 04/21/2019  Glucose 70 - 99 mg/dL 209(H) 144(H) 176(H)  BUN 8 - 23 mg/dL _1 Creatinine 0.44 - 1.00 mg/dL 1.24(H) 1.14(H) 1.24(H)  Sodium 135 - 145 mmol/L 138 137 137  Potassium 3.5 - 5.1 mmol/L 4.1 3.9 3.9  Chloride 98 - 111 mmol/L 106 108 106  CO2 22 - 32 mmol/L _2 Calcium 8.9 - 10.3 mg/dL 8.8(L) 8.9 8.6(L)  Total Protein 6.5 - 8.1 g/dL 7.1 7.0 7.1  Total Bilirubin 0.3 - 1.2 mg/dL 0.4 0.4 0.3  Alkaline Phos 38 - 126 U/L 100 96 91  AST 15 - 41 U/L 10(L) 10(L) 14(L)  ALT 0 - 44 U/L _3 Hepatic Function Latest Ref Rng & Units 04/23/2019 04/22/2019 04/21/2019  Total Protein 6.5 - 8.1 g/dL 7.1 7.0 7.1  Albumin 3.5 - 5.0 g/dL 3.4(L) 3.2(L) 3.3(L)  AST 15 - 41 U/L 10(L) 10(L) 14(L)  ALT 0 - 44 U/L _4 Alk Phosphatase 38 - 126 U/L 100 96 91  Total Bilirubin 0.3 - 1.2 mg/dL 0.4 0.4 0.3  Bilirubin, Direct 0.0 - 0.2 mg/dL - - -      Assessment:  #1.  Iron deficiency anemia secondary to GI bleed.  Patient has received 2 units of PRBCs.  She underwent EGD yesterday and no bleeding lesion was identified.  Duodenal nodule was biopsied.  Path would be out tomorrow.  Patient has history of sigmoid diverticulosis and colonic polyps.  Last colonoscopy was in November  2012.  #2.  Chronic atrial fibrillation.  Anticoagulant is on hold.  #3.  History of dementia.  #4.  Neck pain and headache possibly due to arthritis.   Recommendations  Colonoscopy later today. Patient will be prepped with [facility].  I did talk with her daughter [name] as Olin Hauser is not available.  Olin Hauser has a power of attorney.

## 2019-04-24 ENCOUNTER — Encounter: Payer: Self-pay | Admitting: Internal Medicine

## 2019-04-24 ENCOUNTER — Encounter (HOSPITAL_COMMUNITY)
Admission: EM | Disposition: A | Discharge status: Home / Self Care | Payer: Self-pay | Source: Home / Self Care | Attending: Family Medicine

## 2019-04-24 DIAGNOSIS — K644 Residual hemorrhoidal skin tags: Secondary | ICD-10-CM

## 2019-04-24 DIAGNOSIS — K5730 Diverticulosis of large intestine without perforation or abscess without bleeding: Secondary | ICD-10-CM

## 2019-04-24 DIAGNOSIS — K6289 Other specified diseases of anus and rectum: Secondary | ICD-10-CM

## 2019-04-24 DIAGNOSIS — D175 Benign lipomatous neoplasm of intra-abdominal organs: Secondary | ICD-10-CM

## 2019-04-24 HISTORY — PX: COLONOSCOPY: SHX5424

## 2019-04-24 LAB — GLUCOSE, CAPILLARY
Glucose-Capillary: 144 mg/dL — ABNORMAL HIGH (ref 70–99)
Glucose-Capillary: 133 mg/dL — ABNORMAL HIGH (ref 70–99)
Glucose-Capillary: 179 mg/dL — ABNORMAL HIGH (ref 70–99)
Glucose-Capillary: 171 mg/dL — ABNORMAL HIGH (ref 70–99)

## 2019-04-24 LAB — CBC
HCT: 36 % (ref 36.0–46.0)
Hemoglobin: 10 g/dL — ABNORMAL LOW (ref 12.0–15.0)
MCH: 20.4 pg — ABNORMAL LOW (ref 26.0–34.0)
MCHC: 27.8 g/dL — ABNORMAL LOW (ref 30.0–36.0)
MCV: 73.3 fL — ABNORMAL LOW (ref 80.0–100.0)
Platelets: 432 10*3/uL — ABNORMAL HIGH (ref 150–400)
RBC: 4.91 MIL/uL (ref 3.87–5.11)
RDW: 24.8 % — ABNORMAL HIGH (ref 11.5–15.5)
WBC: 8.8 10*3/uL (ref 4.0–10.5)
nRBC: 0 % (ref 0.0–0.2)

## 2019-04-24 LAB — MAGNESIUM: Magnesium: 1.9 mg/dL (ref 1.7–2.4)

## 2019-04-24 LAB — BASIC METABOLIC PANEL
Anion gap: 9 (ref 5–15)
BUN: 9 mg/dL (ref 8–23)
CO2: 24 mmol/L (ref 22–32)
Calcium: 9 mg/dL (ref 8.9–10.3)
Chloride: 106 mmol/L (ref 98–111)
Creatinine, Ser: 1.08 mg/dL — ABNORMAL HIGH (ref 0.44–1.00)
GFR calc Af Amer: 57 mL/min — ABNORMAL LOW (ref >60)
GFR calc non Af Amer: 49 mL/min — ABNORMAL LOW (ref >60)
Glucose, Bld: 159 mg/dL — ABNORMAL HIGH (ref 70–99)
Potassium: 3.6 mmol/L (ref 3.5–5.1)
Sodium: 139 mmol/L (ref 135–145)

## 2019-04-24 LAB — SURGICAL PATHOLOGY

## 2019-04-24 SURGERY — COLONOSCOPY
Anesthesia: Moderate Sedation

## 2019-04-24 MED ORDER — MAGNESIUM CITRATE PO SOLN
0.5000 | Freq: Once | ORAL | Status: AC
  Administered 2019-04-24: 0.5 via ORAL
  Filled 2019-04-24: qty 296

## 2019-04-24 MED ORDER — SODIUM CHLORIDE 0.9 % IV SOLN: INTRAVENOUS | Status: DC

## 2019-04-24 MED ORDER — MEPERIDINE HCL 50 MG/ML IJ SOLN
INTRAMUSCULAR | Status: AC
  Filled 2019-04-24: qty 1

## 2019-04-24 MED ORDER — DABIGATRAN ETEXILATE MESYLATE 150 MG PO CAPS: 150.0000 mg | ORAL_CAPSULE | Freq: Two times a day (BID) | ORAL | 3 refills | Status: DC

## 2019-04-24 MED ORDER — MIDAZOLAM HCL 5 MG/5ML IJ SOLN
INTRAMUSCULAR | Status: DC | PRN
  Administered 2019-04-24: 1 mg via INTRAVENOUS
  Administered 2019-04-24 (×2): 2 mg via INTRAVENOUS

## 2019-04-24 MED ORDER — MIDAZOLAM HCL 5 MG/5ML IJ SOLN
INTRAMUSCULAR | Status: AC
  Filled 2019-04-24: qty 10

## 2019-04-24 MED ORDER — MEPERIDINE HCL 50 MG/ML IJ SOLN
INTRAMUSCULAR | Status: DC | PRN
  Administered 2019-04-24: 20 mg via INTRAVENOUS
  Administered 2019-04-24: 10 mg via INTRAVENOUS
  Administered 2019-04-24: 20 mg via INTRAVENOUS

## 2019-04-24 MED ORDER — STERILE WATER FOR IRRIGATION IR SOLN
Status: DC | PRN
  Administered 2019-04-24: 1.5 mL

## 2019-04-24 MED ORDER — NOVOLIN 70/30 (70-30) 100 UNIT/ML ~~LOC~~ SUSP: 40.0000 [IU] | Freq: Two times a day (BID) | SUBCUTANEOUS | 11 refills | Status: DC

## 2019-04-24 NOTE — Progress Notes (Addendum)
  Subjective:  Patient has no complaints.  She says she is not hungry.  She denies nausea vomiting or abdominal pain.  She is not sure how much of prep she drank.  She is not sure if she is passing clear stools or not.  She denies shortness of breath or chest pain.  Objective: Blood pressure (!) 151/68, pulse 82, temperature 97.6 F (36.4 C), temperature source Oral, resp. rate 18, height '5\' 7"'$  (1.702 m), weight 106.7 kg, SpO2 100 %. Patient is alert and in no acute distress. Cardiac exam with regular rhythm normal S1 and S2.  Grade 2/6 systolic murmur best heard at aortic area. Lungs clear to auscultation. Abdomen is full but soft and nontender with organomegaly or masses No peripheral edema or clubbing noted.   Labs/studies Results:  CBC Latest Ref Rng & Units 04/24/2019 04/23/2019 04/22/2019  WBC 4.0 - 10.5 K/uL 8.8 10.8(H) 7.0  Hemoglobin 12.0 - 15.0 g/dL 10.0(L) 9.0(L) 8.7(L)  Hematocrit 36.0 - 46.0 % 36.0 32.8(L) 32.1(L)  Platelets 150 - 400 K/uL 432(H) 423(H) 395    CMP Latest Ref Rng & Units 04/24/2019 04/23/2019 04/22/2019  Glucose 70 - 99 mg/dL 159(H) 209(H) 144(H)  BUN 8 - 23 mg/dL '9 13 13  '$ Creatinine 0.44 - 1.00 mg/dL 1.08(H) 1.24(H) 1.14(H)  Sodium 135 - 145 mmol/L 139 138 137  Potassium 3.5 - 5.1 mmol/L 3.6 4.1 3.9  Chloride 98 - 111 mmol/L 106 106 108  CO2 22 - 32 mmol/L '24 25 23  '$ Calcium 8.9 - 10.3 mg/dL 9.0 8.8(L) 8.9  Total Protein 6.5 - 8.1 g/dL - 7.1 7.0  Total Bilirubin 0.3 - 1.2 mg/dL - 0.4 0.4  Alkaline Phos 38 - 126 U/L - 100 96  AST 15 - 41 U/L - 10(L) 10(L)  ALT 0 - 44 U/L - 10 10    Hepatic Function Latest Ref Rng & Units 04/23/2019 04/22/2019 04/21/2019  Total Protein 6.5 - 8.1 g/dL 7.1 7.0 7.1  Albumin 3.5 - 5.0 g/dL 3.4(L) 3.2(L) 3.3(L)  AST 15 - 41 U/L 10(L) 10(L) 14(L)  ALT 0 - 44 U/L '10 10 11  '$ Alk Phosphatase 38 - 126 U/L 100 96 91  Total Bilirubin 0.3 - 1.2 mg/dL 0.4 0.4 0.3  Bilirubin, Direct 0.0 - 0.2 mg/dL - - -    Duodenal biopsy  pending.  Assessment:  #1.  Iron deficiency anemia secondary to GI bleed.  Patient underwent esophagogastroduodenoscopy 2 days ago and no bleeding lesion was identified.  Dr. Vincenza Hews did biopsy duodenal nodule and biopsy should be out later today. Patient was supposed to undergo colonoscopy yesterday but she never took the prep as recommended.  I am concerned that she is still not prepped. Hemoglobin is gradually coming up.   #2.  Chronic atrial fibrillation.  Pradaxa is on hold.  #3.  Diabetes mellitus.  Glucose levels fluctuating between 209 and 144  #4.  Chronic kidney disease.  Renal function has stabilized/improved since hospitalization.   Plan:  Mag citrate 5 ounces by mouth this morning. Diagnostic colonoscopy this afternoon.

## 2019-04-24 NOTE — Discharge Instructions (Signed)
Anemia  Anemia is a condition in which you do not have enough red blood cells or hemoglobin. Hemoglobin is a substance in red blood cells that carries oxygen. When you do not have enough red blood cells or hemoglobin (are anemic), your body cannot get enough oxygen and your organs may not work properly. As a result, you may feel very tired or have other problems. What are the causes? Common causes of anemia include:  Excessive bleeding. Anemia can be caused by excessive bleeding inside or outside the body, including bleeding from the intestine or from periods in women.  Poor nutrition.  Long-lasting (chronic) kidney, thyroid, and liver disease.  Bone marrow disorders.  Cancer and treatments for cancer.  HIV (human immunodeficiency virus) and AIDS (acquired immunodeficiency syndrome).  Treatments for HIV and AIDS.  Spleen problems.  Blood disorders.  Infections, medicines, and autoimmune disorders that destroy red blood cells. What are the signs or symptoms? Symptoms of this condition include:  Minor weakness.  Dizziness.  Headache.  Feeling heartbeats that are irregular or faster than normal (palpitations).  Shortness of breath, especially with exercise.  Paleness.  Cold sensitivity.  Indigestion.  Nausea.  Difficulty sleeping.  Difficulty concentrating. Symptoms may occur suddenly or develop slowly. If your anemia is mild, you may not have symptoms. How is this diagnosed? This condition is diagnosed based on:  Blood tests.  Your medical history.  A physical exam.  Bone marrow biopsy. Your health care provider may also check your stool (feces) for blood and may do additional testing to look for the cause of your bleeding. You may also have other tests, including:  Imaging tests, such as a CT scan or MRI.  Endoscopy.  Colonoscopy. How is this treated? Treatment for this condition depends on the cause. If you continue to lose a lot of blood, you may  need to be treated at a hospital. Treatment may include:  Taking supplements of iron, vitamin S31, or folic acid.  Taking a hormone medicine (erythropoietin) that can help to stimulate red blood cell growth.  Having a blood transfusion. This may be needed if you lose a lot of blood.  Making changes to your diet.  Having surgery to remove your spleen. Follow these instructions at home:  Take over-the-counter and prescription medicines only as told by your health care provider.  Take supplements only as told by your health care provider.  Follow any diet instructions that you were given.  Keep all follow-up visits as told by your health care provider. This is important. Contact a health care provider if:  You develop new bleeding anywhere in the body. Get help right away if:  You are very weak.  You are short of breath.  You have pain in your abdomen or chest.  You are dizzy or feel faint.  You have trouble concentrating.  You have bloody or black, tarry stools.  You vomit repeatedly or you vomit up blood. Summary  Anemia is a condition in which you do not have enough red blood cells or enough of a substance in your red blood cells that carries oxygen (hemoglobin).  Symptoms may occur suddenly or develop slowly.  If your anemia is mild, you may not have symptoms.  This condition is diagnosed with blood tests as well as a medical history and physical exam. Other tests may be needed.  Treatment for this condition depends on the cause of the anemia. This information is not intended to replace advice given to you by  your health care provider. Make sure you discuss any questions you have with your health care provider. Document Revised: 01/14/2017 Document Reviewed: 03/05/2016 Elsevier Patient Education  [place].

## 2019-04-24 NOTE — Plan of Care (Signed)

## 2019-04-24 NOTE — Progress Notes (Signed)
Colonoscopy note  Preparation excellent. 15 mm lipoma at ascending colon.  Lipoma has increased since last exam of 01/05/2011. Scattered diverticula at sigmoid colon. External hemorrhoids and anal papillae.

## 2019-04-24 NOTE — Op Note (Signed)
[facility] Patient Name: [name] Procedure Date: 04/24/2019 12:09 PM MRN: [id number] Date of Birth: 1940-03-01 Attending MD: Hildred Laser , MD CSN: [id number] Age: 79 Admit Type: Inpatient Procedure:                Colonoscopy Indications:              Iron deficiency anemia secondary to chronic blood                            loss Providers:                Levada Dy A. Theda Sers RN, RN, Aram Candela, Hildred Laser, MD Referring MD:             Irwin Brakeman, MD Medicines:                Meperidine 50 mg IV, Midazolam 5 mg IV Complications:            No immediate complications. Estimated Blood Loss:     Estimated blood loss: none. Procedure:                Pre-Anesthesia Assessment:                           - Prior to the procedure, a History and Physical                            was performed, and patient medications and                            allergies were reviewed. The patient's tolerance of                            previous anesthesia was also reviewed. The risks                            and benefits of the procedure and the sedation                            options and risks were discussed with the patient.                            All questions were answered, and informed consent                            was obtained. Prior Anticoagulants: The patient                            last took Pradaxa (dabigatran) 4 days prior to the                            procedure. ASA Grade Assessment: III - A patient  with severe systemic disease. After reviewing the                            risks and benefits, the patient was deemed in                            satisfactory condition to undergo the procedure.                           After obtaining informed consent, the colonoscope                            was passed under direct vision. Throughout the                            procedure, the patient's  blood pressure, pulse, and                            oxygen saturations were monitored continuously. The                            [id number]-[id number]) scope was introduced through                            the anus and advanced to the the cecum, identified                            by appendiceal orifice and ileocecal valve. The                            colonoscopy was somewhat difficult due to a                            redundant colon. Successful completion of the                            procedure was aided by changing the patient's                            position and scope guide. The patient tolerated the                            procedure well. The quality of the bowel                            preparation was excellent. The ileocecal valve,                            appendiceal orifice, and rectum were photographed. Scope In: 12:53:13 PM Scope Out: 1:17:31 PM Scope Withdrawal Time: 0 hours 6 minutes 27 seconds  Total Procedure Duration: 0 hours 24 minutes 18 seconds  Findings:      The perianal and digital rectal examinations were normal.      There was a medium-sized lipoma,  in the mid transverse colon.      Scattered diverticula were found in the sigmoid colon.      The exam was otherwise normal throughout the examined colon.      External hemorrhoids were found during retroflexion. The hemorrhoids       were medium-sized.      Anal papilla(e) were hypertrophied. 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                           - Diverticulosis in the sigmoid colon.                           - External hemorrhoids.                           - Anal papilla(e) were hypertrophied.                           - No specimens collected. Moderate Sedation:      Moderate (conscious) sedation was administered by the endoscopy nurse       and supervised by the endoscopist. The following parameters were       monitored: oxygen  saturation, heart rate, blood pressure, CO2       capnography and response to care. Total physician intraservice time was       28 minutes. Recommendation:           - Patient has a contact number available for                            emergencies. The signs and symptoms of potential                            delayed complications were discussed with the                            patient. Return to normal activities tomorrow.                            Written discharge instructions were provided to the                            patient.                           - Diabetic (ADA) diet today.                           - Continue present medications.                           - Resume Pradaxa (dabigatran) at prior dose today.                           - To visualize the small bowel, perform video  capsule endoscopy as outpatient.                           - No po iron for now. Procedure Code(s):        --- Professional ---                           [phone], Colonoscopy, flexible; diagnostic, including                            collection of specimen(s) by brushing or washing,                            when performed (separate procedure)                           99153, Moderate sedation; each additional 15                            minutes intraservice time                           G0500, Moderate sedation services provided by the                            same physician or other qualified health care                            professional performing a gastrointestinal                            endoscopic service that sedation supports,                            requiring the presence of an independent trained                            observer to assist in the monitoring of the                            patient's level of consciousness and physiological                            status; initial 15 minutes of intra-service time;                             patient age 38 years or older (additional time may                            be reported with [phone], as appropriate) Diagnosis Code(s):        --- Professional ---                           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 All rights reserved. The codes documented in this report are preliminary and upon coder review may  be revised to meet current compliance requirements. Hildred Laser, MD Hildred Laser, MD 04/24/2019 1:34:06 PM This report has been signed electronically. Number of Addenda: 0

## 2019-04-24 NOTE — Discharge Summary (Signed)
Physician Discharge Summary  [name] [id number] DOB: 1940/08/08 DOA: 04/20/2019  PCP: [name], MD GI: Rehman  Admit date: 04/20/2019 Discharge date: 04/24/2019  Admitted From:  Home  Disposition:  Home   Recommendations for Outpatient Follow-up:  1. Follow up with PCP in 2 weeks 2. Please obtain CBC in one week to follow up Hg 3. Follow up with GI as scheduled.   Discharge Condition: STABLE   CODE STATUS: DNR    Brief Hospitalization Summary: Please see all hospital notes, images, labs for full details of the hospitalization. HPI: [name] is a 79 y.o. female with chronic atrial fibrillation fully anticoagulated with Pradaxa, history of iron deficiency anemia, history of duodenal bleeding, chronic back pain, chronic osteoarthritis, dementia, type 2 diabetes mellitus has seen her PCP recently with complaints of progressive weakness and fatigue.  Her PCP perform some laboratory tests in the office and noted that her hemoglobin has been dropping.  Down to 7.0 when it had been 8 in September 2020.  The patient says she has noticed no black stools.  She is has denied blood in stool.  The patient does have a history of GI bleeding.  She was seen by GI and had a colonoscopy in 2012 reported as normal colon exam and EGD in 2013 with findings of duodenal bleeding also had a capsule endoscopy study in 2013 where some bleeding was noted.  The patient's caretaker noted that she had been checking the patient's home bowel movements regularly and has not noted any black stools tarry stools or blood in stool.  The patient denies having chest pain but reports that she has been having weakness and poor exercise tolerance.  She normally is able to ambulate with a walker but has been very weak in the past several days.  ED course: Labs retested and confirmed hemoglobin of 6.8.  Patient was noted to have platelet count of 475.  Normal WBC.  Glucose was elevated at 144.  Potassium was 3.7 and  creatinine 1.34.  PT 16.0, INR 1.3.  Patient was ordered for blood transfusion.  Hemoccult test was positive.  The patient was vaccinated for COVID-19 in February 2021.  Hemoglobin A1c was 8.1%.  The patient was started on IV Protonix and hospital admission and GI consultation was requested.  Brief Admission Hx: 79 y.o.femalewith chronic atrial fibrillation fully anticoagulated with Pradaxa, history of iron deficiency anemia, history of duodenal bleeding, chronic back pain, chronic osteoarthritis, dementia, type 2 diabetes mellitus has seen her PCP recently with complaints of progressive weakness and fatigue.  MDM/Assessment & Plan:   1. Acute on chronic blood loss anemia - Hg has been trending down since last Hg of 7.9 in Sept 2020.Patient has Hemoccult positive stools. She is also fully anticoagulated on Pradaxa. The Pradaxa has been placed on hold temporarily while we are evaluating the acute bleed. The patient is being treated with packed red blood cell transfusion 2 units and will be monitored closely on telemetry. I have requested a GI consultation given that she has had duodenal lesions in the past that have been known to bleed.   Spoke with Dr. [name] and pt had EGD 3/7.  Colonoscopy with findings of 15 mm lipoma at ascending colon and scattered diverticula at sigmoid colon.  External hemorrhoids and anal papillae seen. I spoke with Dr. Laural Golden and patient can DC home today and restart pradaxa in AM. He will arrange her follow up appointment with GI for capsule endoscopy considerations.  2. Chronic atrial fibrillation-patient is rate controlled at this time. She is fully anticoagulated with Pradaxa which was temporarily being held as noted above.   3. Stage IIIb CKD-currently stable we will continue to monitor. 4. Type 2 diabetes mellitus with neurological complications-supplemental sliding scale coverage ordered and CBG testing ordered. Poorly controlled disease as evidenced by  hemoglobin A1c of 8.0%.   HOLD 70/30 while NPO.  5. Hypoglycemia from insulin-treated and resolved, I have reduced her 70/30 insulin significantly to avoid low blood glucose. 6. History of vitamin D deficiency-vitamin D level is now within normal limits. 7. Chronic pain-secondary to back pain and diffuse osteoarthritis controlled with home pain medications as needed. 8. 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 9. GERD-IV Protonix 40 mg twice daily ordered for GI protection. 10. Iron deficiency anemia secondary to chronic blood loss-iron level very low at 7. 11. History of pulmonary emboli-patient has a history of bilateral pulmonary emboli. She was fully anticoagulated with Pradaxa, this is currently on hold and SCDs have been ordered for DVT prophylaxis. 12. Dementia and memory loss-delirium precautions. 13. Essential hypertension-resume home BP meds when can take P.O. Will follow. 14. DNR present on admission-we will continue to honor her wishes while in hospital.   DVT Prophylaxis:SCDs Code Status:DNR Family Communication:Niece at bedside 3/5, called and spoke with sister [name] 3/6 Disposition Plan:Presenting from home she has 24/7 caregivers in place and she has been cleared to discharge home by GI, can resume pradaxa tomorrow morning.   Consultants:  GI  Procedures:  EGD 04/23/19 Impression: - Normal esophagus. Dilated. - Small hiatal hernia. -Duodenal polypoid nodules?status post biopsy. No  convincing lesion in her upper GI tract to explain  Hemoccult positive stool and degree of iron  deficiency anemia. Its been nearly 10 years since  she had a colonoscopy (multiple adenomas removed  previously). Moderate  Sedation: Moderate (conscious) sedation was administered by the endoscopy nurse  and supervised by the endoscopist. The following parameters were  monitored: oxygen saturation, heart rate, blood pressure, respiratory  rate, EKG, adequacy of pulmonary ventilation, and response to care.  Total physician intraservice time was 29 minutes. Recommendation: - Return patient to hospital ward for ongoing care. - Clear liquid diet. - Continue present medications. May be best to consider diagnostic colonoscopy while she is here  off anticoagulation therapy. We will leave her on a clear liquid diet. Dr. Laural Golden to see tomorrow  morning - will determine further management strategy. Follow-up on pathology. I've discussed my findings and recommendations with patient's sister [name] at [phone]   Discharge Diagnoses:  Active Problems:   Acute GI bleeding   Dyslipidemia   Essential hypertension   Gastroesophageal reflux disease   Anemia, iron deficiency   Memory loss   Depression with anxiety   Vitamin D deficiency   Metabolic syndrome X   Chronic anticoagulation   Type 2 diabetes mellitus with neurological complications (HCC)   Chronic bronchitis (HCC)   Chronic pain syndrome   History of pulmonary embolism   Generalized osteoarthritis   CKD stage G3b/A1, GFR 30-44 and albumin creatinine ratio <30 mg/g   Discharge Instructions:  Allergies as of 04/24/2019      Reactions   Penicillins Itching   Did it involve swelling of the face/tongue/throat, SOB, or low BP? No Did it involve sudden or severe rash/hives, skin peeling, or any reaction on the inside of your mouth or nose? No Did you need to  seek medical attention at a hospital or doctor's office? No When did it last happen?many years ago If all above answers are "NO", may  proceed with cephalosporin use.   Fentanyl Itching   Sulfonamide Derivatives Hives   Codeine Itching, Rash   tussionex  Is tolerated by patient, no phenergan dm       Medication List    TAKE these medications   acetaminophen 500 MG tablet Commonly known as: TYLENOL Take one tablet two times daily for chronic pain What changed:   how much to take  how to take this  when to take this  additional instructions   albuterol 108 (90 Base) MCG/ACT inhaler Commonly known as: Ventolin HFA Inhale 2 puffs into the lungs every 6 (six) hours as needed for wheezing or shortness of breath.   amLODipine 10 MG tablet Commonly known as: NORVASC Take 1 tablet (10 mg total) by mouth daily.   atorvastatin 40 MG tablet Commonly known as: LIPITOR Take 1 tablet (40 mg total) by mouth daily.   busPIRone 7.5 MG tablet Commonly known as: BUSPAR Take 1 tablet (7.5 mg total) by mouth 3 (three) times daily.   cloNIDine 0.2 MG tablet Commonly known as: CATAPRES Take 1 tablet (0.2 mg total) by mouth at bedtime.   clotrimazole-betamethasone cream Commonly known as: Lotrisone Apply 1 application topically 2 (two) times daily. D/c vusion ointment   colchicine 0.6 MG tablet Take 1 tablet (0.6 mg total) by mouth daily. May decrease to once per day as needed for diarrhea What changed:   when to take this  reasons to take this  additional instructions   Combigan 0.2-0.5 % ophthalmic solution Generic drug: brimonidine-timolol Place 1 drop into both eyes 2 (two) times daily.   conjugated estrogens vaginal cream Commonly known as: PREMARIN Place vaginally 2 (two) times a week. Apply sparingly ( fingertip)  Twice weekly to vaginal area on Wednesdays and Fridays   dabigatran 150 MG Caps capsule Commonly known as: Pradaxa Take 1 capsule (150 mg total) by mouth 2 (two) times daily. Start taking on: April 25, 2019 What changed: See the new instructions.   docusate sodium 100 MG  capsule Commonly known as: COLACE Take 1 capsule (100 mg total) by mouth daily as needed for mild constipation. What changed: when to take this   donepezil 10 MG tablet Commonly known as: ARICEPT Take 1 tablet (10 mg total) by mouth at bedtime.   Fluoxetine HCl (PMDD) 10 MG Tabs Take 1 tablet (10 mg total) by mouth daily.   hydrOXYzine 25 MG capsule Commonly known as: VISTARIL Take 1 capsule (25 mg total) by mouth 3 (three) times daily.   imipramine 50 MG tablet Commonly known as: TOFRANIL TAKE 2 TABLETS (100 MG TOTAL) BY MOUTH AT BEDTIME.   meclizine 25 MG tablet Commonly known as: ANTIVERT TAKE 1 TABLET THREE TIMES DAILY AS NEEDED FOR DIZZINESS. What changed: See the new instructions.   montelukast 10 MG tablet Commonly known as: SINGULAIR TAKE 1 TABLET BY MOUTH EVERYDAY AT BEDTIME   NovoLIN 70/30 (70-30) 100 UNIT/ML injection Generic drug: insulin NPH-regular Human Inject 40 Units into the skin 2 (two) times daily with a meal. Start taking on: April 25, 2019   nystatin powder Commonly known as: MYCOSTATIN/NYSTOP Apply topically daily as needed (for rash under breasts).   olopatadine 0.1 % ophthalmic solution Commonly known as: PATANOL Place 1 drop into both eyes 2 (two) times daily.   omeprazole 40 MG capsule Commonly known as: PRILOSEC  Take 1 capsule (40 mg total) by mouth 2 (two) times daily.   oxyCODONE-acetaminophen 10-325 MG tablet Commonly known as: PERCOCET Take one tablet by mouth two times daily for pain   polyethylene glycol 17 g packet Commonly known as: MIRALAX / GLYCOLAX Take 17 g by mouth daily. What changed:   when to take this  reasons to take this   SUMAtriptan 25 MG tablet Commonly known as: Imitrex Take 1 tablet (25 mg total) by mouth every 2 (two) hours as needed for migraine. May repeat in 2 hours if headache persists or recurs.   SUMAtriptan 5 MG/ACT nasal spray Commonly known as: IMITREX Place 1 spray into the nose every 2  (two) hours as needed for migraine.   temazepam 30 MG capsule Commonly known as: RESTORIL Take 1 capsule (30 mg total) by mouth at bedtime as needed for sleep.   topiramate 100 MG tablet Commonly known as: TOPAMAX Take 1 tablet (100 mg total) by mouth 2 (two) times daily.   Vitamin D (Ergocalciferol) 1.25 MG (50000 UNIT) Caps capsule Commonly known as: DRISDOL TAKE 1 CAPSULE (50,000 UNITS TOTAL) BY MOUTH ONCE A WEEK. What changed: See the new instructions.      Follow-up Information    [name], MD. Schedule an appointment as soon as possible for a visit in 2 week(s).   Specialty: Family Medicine Contact information: [address], [address] [place] [address] [phone]        Arnoldo Lenis, MD .   Specialty: Cardiology Contact information: [address] [place] [address] [phone]        Rogene Houston, MD. Schedule an appointment as soon as possible for a visit in 6 week(s).   Specialty: Gastroenterology Contact information: [address], [address] [place] [address] [phone]          Allergies  Allergen Reactions  . Penicillins Itching    Did it involve swelling of the face/tongue/throat, SOB, or low BP? No Did it involve sudden or severe rash/hives, skin peeling, or any reaction on the inside of your mouth or nose? No Did you need to seek medical attention at a hospital or doctor's office? No When did it last happen?many years ago If all above answers are "NO", may proceed with cephalosporin use.    . Fentanyl Itching  . Sulfonamide Derivatives Hives  . Codeine Itching and Rash    tussionex  Is tolerated by patient, no phenergan dm    Allergies as of 04/24/2019      Reactions   Penicillins Itching   Did it involve swelling of the face/tongue/throat, SOB, or low BP? No Did it involve sudden or severe rash/hives, skin peeling, or any reaction on the inside of your mouth or nose? No Did you need to seek  medical attention at a hospital or doctor's office? No When did it last happen?many years ago If all above answers are "NO", may proceed with cephalosporin use.   Fentanyl Itching   Sulfonamide Derivatives Hives   Codeine Itching, Rash   tussionex  Is tolerated by patient, no phenergan dm       Medication List    TAKE these medications   acetaminophen 500 MG tablet Commonly known as: TYLENOL Take one tablet two times daily for chronic pain What changed:   how much to take  how to take this  when to take this  additional instructions   albuterol 108 (90 Base) MCG/ACT inhaler  Commonly known as: Ventolin HFA Inhale 2 puffs into the lungs every 6 (six) hours as needed for wheezing or shortness of breath.   amLODipine 10 MG tablet Commonly known as: NORVASC Take 1 tablet (10 mg total) by mouth daily.   atorvastatin 40 MG tablet Commonly known as: LIPITOR Take 1 tablet (40 mg total) by mouth daily.   busPIRone 7.5 MG tablet Commonly known as: BUSPAR Take 1 tablet (7.5 mg total) by mouth 3 (three) times daily.   cloNIDine 0.2 MG tablet Commonly known as: CATAPRES Take 1 tablet (0.2 mg total) by mouth at bedtime.   clotrimazole-betamethasone cream Commonly known as: Lotrisone Apply 1 application topically 2 (two) times daily. D/c vusion ointment   colchicine 0.6 MG tablet Take 1 tablet (0.6 mg total) by mouth daily. May decrease to once per day as needed for diarrhea What changed:   when to take this  reasons to take this  additional instructions   Combigan 0.2-0.5 % ophthalmic solution Generic drug: brimonidine-timolol Place 1 drop into both eyes 2 (two) times daily.   conjugated estrogens vaginal cream Commonly known as: PREMARIN Place vaginally 2 (two) times a week. Apply sparingly ( fingertip)  Twice weekly to vaginal area on Wednesdays and Fridays   dabigatran 150 MG Caps capsule Commonly known as: Pradaxa Take 1 capsule (150 mg total) by mouth 2  (two) times daily. Start taking on: April 25, 2019 What changed: See the new instructions.   docusate sodium 100 MG capsule Commonly known as: COLACE Take 1 capsule (100 mg total) by mouth daily as needed for mild constipation. What changed: when to take this   donepezil 10 MG tablet Commonly known as: ARICEPT Take 1 tablet (10 mg total) by mouth at bedtime.   Fluoxetine HCl (PMDD) 10 MG Tabs Take 1 tablet (10 mg total) by mouth daily.   hydrOXYzine 25 MG capsule Commonly known as: VISTARIL Take 1 capsule (25 mg total) by mouth 3 (three) times daily.   imipramine 50 MG tablet Commonly known as: TOFRANIL TAKE 2 TABLETS (100 MG TOTAL) BY MOUTH AT BEDTIME.   meclizine 25 MG tablet Commonly known as: ANTIVERT TAKE 1 TABLET THREE TIMES DAILY AS NEEDED FOR DIZZINESS. What changed: See the new instructions.   montelukast 10 MG tablet Commonly known as: SINGULAIR TAKE 1 TABLET BY MOUTH EVERYDAY AT BEDTIME   NovoLIN 70/30 (70-30) 100 UNIT/ML injection Generic drug: insulin NPH-regular Human Inject 40 Units into the skin 2 (two) times daily with a meal. Start taking on: April 25, 2019   nystatin powder Commonly known as: MYCOSTATIN/NYSTOP Apply topically daily as needed (for rash under breasts).   olopatadine 0.1 % ophthalmic solution Commonly known as: PATANOL Place 1 drop into both eyes 2 (two) times daily.   omeprazole 40 MG capsule Commonly known as: PRILOSEC Take 1 capsule (40 mg total) by mouth 2 (two) times daily.   oxyCODONE-acetaminophen 10-325 MG tablet Commonly known as: PERCOCET Take one tablet by mouth two times daily for pain   polyethylene glycol 17 g packet Commonly known as: MIRALAX / GLYCOLAX Take 17 g by mouth daily. What changed:   when to take this  reasons to take this   SUMAtriptan 25 MG tablet Commonly known as: Imitrex Take 1 tablet (25 mg total) by mouth every 2 (two) hours as needed for migraine. May repeat in 2 hours if headache  persists or recurs.   SUMAtriptan 5 MG/ACT nasal spray Commonly known as: IMITREX Place 1 spray into  the nose every 2 (two) hours as needed for migraine.   temazepam 30 MG capsule Commonly known as: RESTORIL Take 1 capsule (30 mg total) by mouth at bedtime as needed for sleep.   topiramate 100 MG tablet Commonly known as: TOPAMAX Take 1 tablet (100 mg total) by mouth 2 (two) times daily.   Vitamin D (Ergocalciferol) 1.25 MG (50000 UNIT) Caps capsule Commonly known as: DRISDOL TAKE 1 CAPSULE (50,000 UNITS TOTAL) BY MOUTH ONCE A WEEK. What changed: See the new instructions.       Procedures/Studies:  No results found.   Subjective: Pt reports that she feels better and has no complaints, tolerated procedures well.  No bleeding seen in stools.   Discharge Exam: Vitals:   04/24/19 1320 04/24/19 1325  BP: (!) 124/58 (!) 127/103  Pulse: 80 79  Resp: 20 20  Temp:    SpO2: 100% 100%   Vitals:   04/24/19 1310 04/24/19 1315 04/24/19 1320 04/24/19 1325  BP: 121/65 97/79 (!) 124/58 (!) 127/103  Pulse: 83 87 80 79  Resp:  (!) 22 20 20   Temp:      TempSrc:      SpO2: 98% 98% 100% 100%  Weight:      Height:       General exam: Elderly female overweight lying in the bed she is awake alert Respiratory system: Clear. No increased work of breathing. Cardiovascular system: S1 & S2 heard. No JVD, murmurs, gallops, clicks or pedal edema. Gastrointestinal system: Abdomen is nondistended, soft and nontender. Normal bowel sounds heard. Central nervous system: Alert and oriented. No focal neurological deficits. Extremities: no cyanosis or clubbing.   The results of significant diagnostics from this hospitalization (including imaging, microbiology, ancillary and laboratory) are listed below for reference.     Microbiology: Recent Results (from the past 240 hour(s))  Respiratory Panel by RT PCR (Flu A&B, Covid) - Nasopharyngeal Swab     Status: None   Collection Time: 04/22/19 10:26  AM   Specimen: Nasopharyngeal Swab  Result Value Ref Range Status   SARS Coronavirus 2 by RT PCR NEGATIVE NEGATIVE Final    Comment: (NOTE) SARS-CoV-2 target nucleic acids are NOT DETECTED. The SARS-CoV-2 RNA is generally detectable in upper respiratoy specimens during the acute phase of infection. The lowest concentration of SARS-CoV-2 viral copies this assay can detect is 131 copies/mL. A negative result does not preclude SARS-Cov-2 infection and should not be used as the sole basis for treatment or other patient management decisions. A negative result may occur with  improper specimen collection/handling, submission of specimen other than nasopharyngeal swab, presence of viral mutation(s) within the areas targeted by this assay, and inadequate number of viral copies (<131 copies/mL). A negative result must be combined with clinical observations, patient history, and epidemiological information. The expected result is Negative. Fact Sheet for Patients:  PinkCheek.be Fact Sheet for Healthcare Providers:  GravelBags.it This test is not yet ap proved or cleared by the [place] FDA and  has been authorized for detection and/or diagnosis of SARS-CoV-2 by FDA under an Emergency Use Authorization (EUA). This EUA will remain  in effect (meaning this test can be used) for the duration of the COVID-19 declaration under Section 564(b)(1) of the Act, 21 U.S.C. section 360bbb-3(b)(1), unless the authorization is terminated or revoked sooner.    Influenza A by PCR NEGATIVE NEGATIVE Final   Influenza B by PCR NEGATIVE NEGATIVE Final    Comment: (NOTE) The Xpert Xpress SARS-CoV-2/FLU/RSV assay is intended as  an aid in  the diagnosis of influenza from Nasopharyngeal swab specimens and  should not be used as a sole basis for treatment. Nasal washings and  aspirates are unacceptable for Xpert Xpress SARS-CoV-2/FLU/RSV  testing. Fact  Sheet for Patients: PinkCheek.be Fact Sheet for Healthcare Providers: GravelBags.it This test is not yet approved or cleared by the [place] FDA and  has been authorized for detection and/or diagnosis of SARS-CoV-2 by  FDA under an Emergency Use Authorization (EUA). This EUA will remain  in effect (meaning this test can be used) for the duration of the  Covid-19 declaration under Section 564(b)(1) of the Act, 21  U.S.C. section 360bbb-3(b)(1), unless the authorization is  terminated or revoked. Performed at [facility], [address], [place], [place] [address]      Labs: BNP (last 3 results) No results for input(s): BNP in the last 8760 hours. Basic Metabolic Panel: Recent Labs  Lab 04/20/19 1254 04/21/19 0610 04/22/19 0556 04/23/19 0447 04/24/19 0504  NA 135 137 137 138 139  K 3.7 3.9 3.9 4.1 3.6  CL 105 106 108 106 106  CO2 22 22 23 25 24   GLUCOSE 144* 176* 144* 209* 159*  BUN 17 12 13 13 9   CREATININE 1.34* 1.24* 1.14* 1.24* 1.08*  CALCIUM 8.9 8.6* 8.9 8.8* 9.0  MG  --  2.1 2.1 2.0 1.9   Liver Function Tests: Recent Labs  Lab 04/20/19 1254 04/21/19 0610 04/22/19 0556 04/23/19 0447  AST 14* 14* 10* 10*  ALT 13 11 10 10   ALKPHOS 101 91 96 100  BILITOT 0.4 0.3 0.4 0.4  PROT 7.7 7.1 7.0 7.1  ALBUMIN 3.7 3.3* 3.2* 3.4*   No results for input(s): LIPASE, AMYLASE in the last 168 hours. No results for input(s): AMMONIA in the last 168 hours. CBC: Recent Labs  Lab 04/20/19 1254 04/21/19 0610 04/22/19 0556 04/23/19 0447 04/24/19 0504  WBC 9.6 9.2 7.0 10.8* 8.8  NEUTROABS 7.4  --   --   --   --   HGB 6.8* 8.6* 8.7* 9.0* 10.0*  HCT 26.3* 30.8* 32.1* 32.8* 36.0  MCV 68.0* 73.9* 73.0* 73.2* 73.3*  PLT 475* 391 395 423* 432*   Cardiac Enzymes: No results for input(s): CKTOTAL, CKMB, CKMBINDEX, TROPONINI in the last 168 hours. BNP: Invalid input(s): POCBNP CBG: Recent Labs  Lab 04/23/19 2157  04/24/19 0305 04/24/19 0722 04/24/19 1058 04/24/19 1621  GLUCAP 138* 144* 133* 179* 171*   D-Dimer No results for input(s): DDIMER in the last 72 hours. Hgb A1c No results for input(s): HGBA1C in the last 72 hours. Lipid Profile No results for input(s): CHOL, HDL, LDLCALC, TRIG, CHOLHDL, LDLDIRECT in the last 72 hours. Thyroid function studies No results for input(s): TSH, T4TOTAL, T3FREE, THYROIDAB in the last 72 hours.  Invalid input(s): FREET3 Anemia work up No results for input(s): VITAMINB12, FOLATE, FERRITIN, TIBC, IRON, RETICCTPCT in the last 72 hours. Urinalysis    Component Value Date/Time   COLORURINE YELLOW 12/04/2018 1523   APPEARANCEUR CLOUDY (A) 12/04/2018 1523   LABSPEC 1.023 12/04/2018 1523   PHURINE 6.0 12/04/2018 1523   GLUCOSEU NEGATIVE 12/04/2018 1523   HGBUR TRACE (A) 12/04/2018 1523   HGBUR negative 01/06/2010 0929   BILIRUBINUR NEGATIVE 11/10/2018 1920   BILIRUBINUR moderate 11/17/2016 1438   KETONESUR TRACE (A) 12/04/2018 1523   PROTEINUR 2+ (A) 12/04/2018 1523   UROBILINOGEN >=8.0 (A) 11/17/2016 1438   UROBILINOGEN 0.2 10/12/2011 1556   NITRITE POSITIVE (A) 12/04/2018 1523   LEUKOCYTESUR  2+ (A) 12/04/2018 1523   Sepsis Labs Invalid input(s): PROCALCITONIN,  WBC,  LACTICIDVEN Microbiology Recent Results (from the past 240 hour(s))  Respiratory Panel by RT PCR (Flu A&B, Covid) - Nasopharyngeal Swab     Status: None   Collection Time: 04/22/19 10:26 AM   Specimen: Nasopharyngeal Swab  Result Value Ref Range Status   SARS Coronavirus 2 by RT PCR NEGATIVE NEGATIVE Final    Comment: (NOTE) SARS-CoV-2 target nucleic acids are NOT DETECTED. The SARS-CoV-2 RNA is generally detectable in upper respiratoy specimens during the acute phase of infection. The lowest concentration of SARS-CoV-2 viral copies this assay can detect is 131 copies/mL. A negative result does not preclude SARS-Cov-2 infection and should not be used as the sole basis for  treatment or other patient management decisions. A negative result may occur with  improper specimen collection/handling, submission of specimen other than nasopharyngeal swab, presence of viral mutation(s) within the areas targeted by this assay, and inadequate number of viral copies (<131 copies/mL). A negative result must be combined with clinical observations, patient history, and epidemiological information. The expected result is Negative. Fact Sheet for Patients:  PinkCheek.be Fact Sheet for Healthcare Providers:  GravelBags.it This test is not yet ap proved or cleared by the [place] FDA and  has been authorized for detection and/or diagnosis of SARS-CoV-2 by FDA under an Emergency Use Authorization (EUA). This EUA will remain  in effect (meaning this test can be used) for the duration of the COVID-19 declaration under Section 564(b)(1) of the Act, 21 U.S.C. section 360bbb-3(b)(1), unless the authorization is terminated or revoked sooner.    Influenza A by PCR NEGATIVE NEGATIVE Final   Influenza B by PCR NEGATIVE NEGATIVE Final    Comment: (NOTE) The Xpert Xpress SARS-CoV-2/FLU/RSV assay is intended as an aid in  the diagnosis of influenza from Nasopharyngeal swab specimens and  should not be used as a sole basis for treatment. Nasal washings and  aspirates are unacceptable for Xpert Xpress SARS-CoV-2/FLU/RSV  testing. Fact Sheet for Patients: PinkCheek.be Fact Sheet for Healthcare Providers: GravelBags.it This test is not yet approved or cleared by the [place] FDA and  has been authorized for detection and/or diagnosis of SARS-CoV-2 by  FDA under an Emergency Use Authorization (EUA). This EUA will remain  in effect (meaning this test can be used) for the duration of the  Covid-19 declaration under Section 564(b)(1) of the Act, 21  U.S.C. section  360bbb-3(b)(1), unless the authorization is  terminated or revoked. Performed at [facility], [address] [place], [place] [address]     Time coordinating discharge: 31 mins  SIGNED:  Irwin Brakeman, MD  [facility] 04/24/2019, 5:11 PM How to contact the [facility] Attending or Consulting provider [place] or covering provider during after hours [place], for this patient?  1. Check the care team in [facility] and look for a) attending/consulting TRH provider listed and b) the [facility] team listed 2. Log into [email or web address] and use [place]'s universal password to access. If you do not have the password, please contact the hospital operator. 3. Locate the [facility] provider you are looking for under [facility] and page to a number that you can be directly reached. 4. If you still have difficulty reaching the provider, please page the [facility] (Director on Call) for the Hospitalists listed on amion for assistance.

## 2019-04-24 NOTE — Progress Notes (Signed)
Discharge instructions reviewed with patient. Given AVS. States will call to schedule her follow-up appointments as instructed. Verbalized understanding of instructions. Left floor in stable condition via w/c accompanied by nurse tech. Discharged home.

## 2019-04-26 DIAGNOSIS — R0902 Hypoxemia: Secondary | ICD-10-CM | POA: Diagnosis not present

## 2019-04-26 DIAGNOSIS — J42 Unspecified chronic bronchitis: Secondary | ICD-10-CM | POA: Diagnosis not present

## 2019-05-01 ENCOUNTER — Other Ambulatory Visit: Payer: Self-pay

## 2019-05-01 ENCOUNTER — Telehealth: Payer: Self-pay

## 2019-05-01 MED ORDER — AMLODIPINE BESYLATE 10 MG PO TABS: 10.0000 mg | ORAL_TABLET | Freq: Every day | ORAL | 1 refills | Status: DC

## 2019-05-01 NOTE — Telephone Encounter (Signed)
Amlodipine --Please call in to [facility]

## 2019-05-16 ENCOUNTER — Ambulatory Visit (INDEPENDENT_AMBULATORY_CARE_PROVIDER_SITE_OTHER): Payer: Medicare HMO | Admitting: Family Medicine

## 2019-05-16 ENCOUNTER — Encounter: Payer: Self-pay | Admitting: Family Medicine

## 2019-05-16 ENCOUNTER — Other Ambulatory Visit: Payer: Self-pay

## 2019-05-16 VITALS — BP 152/75 | Ht 67.0 in | Wt 235.0 lb

## 2019-05-16 DIAGNOSIS — D649 Anemia, unspecified: Secondary | ICD-10-CM | POA: Diagnosis not present

## 2019-05-16 DIAGNOSIS — Z09 Encounter for follow-up examination after completed treatment for conditions other than malignant neoplasm: Secondary | ICD-10-CM

## 2019-05-16 MED ORDER — "INSULIN SYRINGE-NEEDLE U-100 32G X 5/16"" 1 ML MISC": 1.0000 | Freq: Two times a day (BID) | 11 refills | Status: AC

## 2019-05-16 NOTE — Assessment & Plan Note (Signed)
Ordered updated labs-sister reports will try and get them by Friday. Overall doing well per her.

## 2019-05-16 NOTE — Patient Instructions (Signed)
I appreciate the opportunity to provide you with care for your health and wellness. Today we discussed: recent hospitalization  Follow up: as scheduled  Labs by Friday for blood level check  Please continue to practice social distancing to keep you, your family, and our community safe.  If you must go out, please wear a mask and practice good handwashing.  It was a pleasure to see you and I look forward to continuing to work together on your health and well-being. Please do not hesitate to call the office if you need care or have questions about your care.  Have a wonderful day and week. With Gratitude, Cherly Beach, DNP, AGNP-BC

## 2019-05-16 NOTE — Assessment & Plan Note (Signed)
Reviewed labs, notes, and test while inpatient. Labs for follow up Hgb ordered Sister-historian for [place] reports she seems to be doing well and will try to get her over to the lab by Friday at the latest.

## 2019-05-16 NOTE — Progress Notes (Signed)
Virtual Visit via Telephone Note   This visit type was conducted due to national recommendations for restrictions regarding the COVID-19 Pandemic (e.g. social distancing) in an effort to limit this patient's exposure and mitigate transmission in our community.  Due to her co-morbid illnesses, this patient is at least at moderate risk for complications without adequate follow up.  This format is felt to be most appropriate for this patient at this time.  The patient did not have access to video technology/had technical difficulties with video requiring transitioning to audio format only (telephone).  All issues noted in this document were discussed and addressed.  No physical exam could be performed with this format.     Evaluation Performed:  Follow-up visit  Date:  05/16/2019   ID:  [name], DOB [birth year], MRN [id number]  Patient Location: Home Provider Location: Office  Location of Patient: Home Location of Provider: Telehealth Consent was obtain for visit to be over via telehealth. I verified that I am speaking with the correct person using two identifiers.  PCP:  [name], MD   Chief Complaint:  hospital follow up  History of Present Illness:    [name] is a 79 y.o. female with history of chronic atrial fibrillation, fully coagulated on Pradaxa, history of iron deficiency anemia, history of duodenal bleeding, chronic back pain, chronic osteoarthritis, dementia, type 2 diabetes. Sister is historian secondary to [name] having dementia.   Was admitted to the hospital on March the fifth, discharged on March 9.  Hospitalized secondary to having anemia.  I presented to to the office back in February with Dr. Moshe Cipro for progressive weakness and fatigue.  Dr. Moshe Cipro performed some laboratory tests in the office and noted that her hemoglobin had dropped.  Down to 7 when she had been 8 in September 2020.  Patient says she was not noticing black tarry stools and  had denied blood in her stools.  She does have a history of GI bleeding was seen by GI and had a colonoscopy in 2012 it was reported normal and a EGD in 2013 with findings of duodenal bleeding which led to a capsule study where she was found to have some bleeding.  Patient's caretaker noted that she been checking her home bowel movements regularly and has not noted any black tarry stools or blood in stool.  She did not have any chest pain but reported that she was weak and had poor exercise and activity tolerance.  She had normally been able to ambulate with a walker but was so weak for the last several days prior to going to the emergency room.  She had acute on chronic blood loss anemia she had Hemoccult positive stools.  She is fully coagulated on Pradaxa.  Pradaxa was placed on a temporary hold while she was being evaluated for a acute bleed in the hospital.  She was treated with packed red blood cell transfusion x2 units.  GI consultation for prior duodenal lesions and a EGD was performed on March 7.  Colonoscopy with findings of 15 mm lipoma of ascending colon and scattered diverticuli at sigmoid colon.  External hemorrhoids and anal papillae seen GI reported patient could restart Pradaxa in the morning.  And arrange for follow-up outpatient for capsule study.  The patient does not have symptoms concerning for COVID-19 infection (fever, chills, cough, or new shortness of breath).   Past Medical, Surgical, Social History, Allergies, and Medications have been Reviewed.  Past Medical  History:  Diagnosis Date  . Anemia, iron deficiency 2012   Evaluated by Dr. Oneida Alar; H&H of 9.3/30.8 with [place] in 10/2010; 3/3 positive Hemoccult cards in 07/2011  . Anxiety    was on Prozac for a couple of weeks  . Atrial fibrillation ([place])    takes Coumadin daily  . Bruises easily    takes Coumadin  . Chronic anticoagulation 07/21/2010  . Chronic back pain    related to knee pain  . Degenerative joint disease     Knees  . Dementia ([place])   . Depression   . Diabetes mellitus    takes Metformin daily  . Gastroesophageal reflux disease    takes Omeprazole daily  . Glaucoma   . Glaucoma    uses eye drops at night  . History of blood transfusion 1969  . History of gout    was on medication but taken off of several months ago  . Hx of colonic polyps   . Hx of migraines    last one about a yr ago;takes Topamax daily  . Hyperlipidemia    takes Pravastatin daily  . Hypertension    takes Hyzaar daily and Propranolol   . Insomnia    takes Restoril prn  . Joint pain   . Joint swelling   . Memory loss    takes donepezil  . Migraine   . Migraine   . Nocturia   . Overweight(278.02)   . Pneumonia 1969   hx of  . Pulmonary embolism [facility]) May 2012   acute presentation, bilateral PE  . Skin spots-aging   . Urinary frequency    Past Surgical History:  Procedure Laterality Date  . BIOPSY  04/22/2019   Procedure: BIOPSY;  Surgeon: Daneil Dolin, MD;  [facility];  Service: Endoscopy;;  duodenum  . CATARACT EXTRACTION W/PHACO Right 04/11/2012   Procedure: CATARACT EXTRACTION PHACO AND INTRAOCULAR LENS PLACEMENT (IOC);  Surgeon: Elta Guadeloupe T. Gershon Crane, MD;  Location: [facility];  Service: Ophthalmology;  Laterality: Right;  CDE=9.61  . CATARACT EXTRACTION W/PHACO Left 12/26/2012   Procedure: CATARACT EXTRACTION PHACO AND INTRAOCULAR LENS PLACEMENT (IOC);  Surgeon: Elta Guadeloupe T. Gershon Crane, MD;  Location: [facility];  Service: Ophthalmology;  Laterality: Left;  CDE:7.74  . COLONOSCOPY  Jan 2002; 2012   2002: Dr. Tamala Julian, ext. hemorrhoids, nl colon; 2012-adenomatous polyps, gastritis on EGD  . COLONOSCOPY N/A 04/24/2019   Procedure: COLONOSCOPY;  Surgeon: Rogene Houston, MD;  [facility];  Service: Endoscopy;  Laterality: N/A;  . CYSTOSCOPY W/ URETERAL STENT PLACEMENT Bilateral 02/25/2018   Procedure: CYSTOSCOPY WITH BILATERAL RETROGRADE PYELOGRAM/BILATERAL URETERAL STENT PLACEMENT, BLADDER BIOPSIES WITH  FULGERATION;  Surgeon: [name], MD;  Location: [facility];  Service: Urology;  Laterality: Bilateral;  . CYSTOSCOPY/URETEROSCOPY/HOLMIUM LASER/STENT PLACEMENT Bilateral 03/21/2018   Procedure: CYSTOSCOPY/URETEROSCOPY/HOLMIUM LASER/STENT PLACEMENT;  Surgeon: [name], MD;  Location: [facility];  Service: Urology;  Laterality: Bilateral;  ONLY NEEDS 60 MIN  . DILATION AND CURETTAGE OF UTERUS    . ESOPHAGOGASTRODUODENOSCOPY  08/24/2011   ESOPHAGOGASTRODUODENOSCOPY; esophageal dilatation; Rogene Houston, MD;  . ESOPHAGOGASTRODUODENOSCOPY N/A 04/22/2019   Procedure: ESOPHAGOGASTRODUODENOSCOPY (EGD);  Surgeon: Daneil Dolin, MD;  [facility];  Service: Endoscopy;  Laterality: N/A;  . GIVENS CAPSULE STUDY  08/18/2011   Procedure: GIVENS CAPSULE STUDY;  Surgeon: Rogene Houston, MD;  [facility];  Service: Endoscopy;  Laterality: N/A;  730  . KNEE ARTHROSCOPY W/ MENISCECTOMY  90's   Left  .  MALONEY DILATION  04/22/2019   Procedure: MALONEY DILATION;  Surgeon: Daneil Dolin, MD;  [facility];  Service: Endoscopy;;  . ORIF ANKLE FRACTURE Right 90's  . TONSILLECTOMY    . TOTAL KNEE ARTHROPLASTY  10/20/2011   Procedure: TOTAL KNEE ARTHROPLASTY;  Surgeon: Ninetta Lights, MD;  Location: [place];  Service: Orthopedics;  Laterality: Left;  . UPPER GASTROINTESTINAL ENDOSCOPY    . URETHRAL DILATION       No outpatient medications have been marked as taking for the 05/16/19 encounter (Office Visit) with Perlie Mayo, NP.     Allergies:   Penicillins, Fentanyl, Sulfonamide derivatives, and Codeine   ROS:   Please see the history of present illness.    All other systems reviewed and are negative.   Labs/Other Tests and Data Reviewed:    Recent Labs: 12/04/2018: TSH 1.98 04/23/2019: ALT 10 04/24/2019: BUN 9; Creatinine, Ser 1.08; Hemoglobin 10.0; Magnesium 1.9; Platelets 432; Potassium 3.6; Sodium 139   Recent Lipid Panel Lab Results   Component Value Date/Time   CHOL 135 12/04/2018 03:23 PM   TRIG 178 (H) 12/04/2018 03:23 PM   HDL 55 12/04/2018 03:23 PM   CHOLHDL 2.5 12/04/2018 03:23 PM   LDLCALC 55 12/04/2018 03:23 PM    Wt Readings from Last 3 Encounters:  05/16/19 235 lb (106.6 kg)  04/20/19 235 lb 3.7 oz (106.7 kg)  03/19/19 239 lb (108.4 kg)     Objective:    Vital Signs:  BP (!) 152/75   Ht 5\' 7"  (1.702 m)   Wt 235 lb (106.6 kg)   BMI 36.81 kg/m    VITAL SIGNS:  reviewed GEN:  Alert and oriented RESPIRATORY:  No shortness of breath noted in conversation PSYCH:  Normal affect and mood  ASSESSMENT & PLAN:    1. Encounter for examination following treatment at hospital   2. Anemia, unspecified type  - CBC - COMPLETE METABOLIC PANEL WITH GFR   Time:   Today, I have spent 10 minutes with the patient with telehealth technology discussing the above problems.     Medication Adjustments/Labs and Tests Ordered: Current medicines are reviewed at length with the patient today.  Concerns regarding medicines are outlined above.   Tests Ordered: Orders Placed This Encounter  Procedures  . CBC  . COMPLETE METABOLIC PANEL WITH GFR    Medication Changes: Meds ordered this encounter  Medications  . Insulin Syringe-Needle U-100 32G X 5/16" 1 ML MISC    Sig: 1 each by Does not apply route in the morning and at bedtime. Use to inject insulin twice daily dx e11.65    Dispense:  100 each    Refill:  11    Disposition:  Follow up 08/14/2019 Signed, Perlie Mayo, NP  05/16/2019 2:26 PM     [facility]

## 2019-05-22 DIAGNOSIS — D649 Anemia, unspecified: Secondary | ICD-10-CM | POA: Diagnosis not present

## 2019-05-23 LAB — CBC
HCT: 33 % — ABNORMAL LOW (ref 35.0–45.0)
Hemoglobin: 9.9 g/dL — ABNORMAL LOW (ref 11.7–15.5)
MCH: 21.3 pg — ABNORMAL LOW (ref 27.0–33.0)
MCHC: 30 g/dL — ABNORMAL LOW (ref 32.0–36.0)
MCV: 71 fL — ABNORMAL LOW (ref 80.0–100.0)
MPV: 9.9 fL (ref 7.5–12.5)
Platelets: 375 10*3/uL (ref 140–400)
RBC: 4.65 10*6/uL (ref 3.80–5.10)
WBC: 11.5 10*3/uL — ABNORMAL HIGH (ref 3.8–10.8)

## 2019-05-23 LAB — COMPLETE METABOLIC PANEL WITH GFR
AG Ratio: 1.3 (calc) (ref 1.0–2.5)
ALT: 9 U/L (ref 6–29)
AST: 11 U/L (ref 10–35)
Albumin: 4.1 g/dL (ref 3.6–5.1)
Alkaline phosphatase (APISO): 111 U/L (ref 37–153)
BUN/Creatinine Ratio: 17 (calc) (ref 6–22)
BUN: 19 mg/dL (ref 7–25)
CO2: 25 mmol/L (ref 20–32)
Calcium: 9.8 mg/dL (ref 8.6–10.4)
Chloride: 103 mmol/L (ref 98–110)
Creat: 1.09 mg/dL — ABNORMAL HIGH (ref 0.60–0.93)
GFR, Est African American: 56 mL/min/{1.73_m2} — ABNORMAL LOW (ref >=60)
GFR, Est Non African American: 48 mL/min/{1.73_m2} — ABNORMAL LOW (ref >=60)
Globulin: 3.2 g/dL (calc) (ref 1.9–3.7)
Glucose, Bld: 171 mg/dL — ABNORMAL HIGH (ref 65–139)
Potassium: 4.2 mmol/L (ref 3.5–5.3)
Sodium: 138 mmol/L (ref 135–146)
Total Bilirubin: 0.2 mg/dL (ref 0.2–1.2)
Total Protein: 7.3 g/dL (ref 6.1–8.1)

## 2019-05-27 DIAGNOSIS — J42 Unspecified chronic bronchitis: Secondary | ICD-10-CM | POA: Diagnosis not present

## 2019-05-27 DIAGNOSIS — R0902 Hypoxemia: Secondary | ICD-10-CM | POA: Diagnosis not present

## 2019-06-04 ENCOUNTER — Other Ambulatory Visit: Payer: Self-pay | Admitting: *Deleted

## 2019-06-04 DIAGNOSIS — K922 Gastrointestinal hemorrhage, unspecified: Secondary | ICD-10-CM

## 2019-06-06 ENCOUNTER — Other Ambulatory Visit: Payer: Self-pay | Admitting: Family Medicine

## 2019-06-06 DIAGNOSIS — D649 Anemia, unspecified: Secondary | ICD-10-CM

## 2019-06-06 MED ORDER — FERROUS SULFATE ER 140 (45 FE) MG PO TBCR: 1.0000 | EXTENDED_RELEASE_TABLET | Freq: Three times a day (TID) | ORAL | 1 refills | Status: DC

## 2019-06-08 ENCOUNTER — Other Ambulatory Visit: Payer: Self-pay | Admitting: *Deleted

## 2019-06-08 MED ORDER — MONTELUKAST SODIUM 10 MG PO TABS: ORAL_TABLET | ORAL | 1 refills | Status: DC

## 2019-06-20 DIAGNOSIS — K922 Gastrointestinal hemorrhage, unspecified: Secondary | ICD-10-CM | POA: Diagnosis not present

## 2019-06-20 LAB — CBC
HCT: 31.7 % — ABNORMAL LOW (ref 35.0–45.0)
Hemoglobin: 10 g/dL — ABNORMAL LOW (ref 11.7–15.5)
MCH: 23.1 pg — ABNORMAL LOW (ref 27.0–33.0)
MCHC: 31.5 g/dL — ABNORMAL LOW (ref 32.0–36.0)
MCV: 73.2 fL — ABNORMAL LOW (ref 80.0–100.0)
MPV: 9 fL (ref 7.5–12.5)
Platelets: 354 10*3/uL (ref 140–400)
RBC: 4.33 10*6/uL (ref 3.80–5.10)
RDW: 26.5 % — ABNORMAL HIGH (ref 11.0–15.0)
WBC: 9.4 10*3/uL (ref 3.8–10.8)

## 2019-06-26 DIAGNOSIS — R0902 Hypoxemia: Secondary | ICD-10-CM | POA: Diagnosis not present

## 2019-06-26 DIAGNOSIS — J42 Unspecified chronic bronchitis: Secondary | ICD-10-CM | POA: Diagnosis not present

## 2019-07-10 ENCOUNTER — Other Ambulatory Visit: Payer: Self-pay | Admitting: Family Medicine

## 2019-07-17 ENCOUNTER — Other Ambulatory Visit: Payer: Self-pay | Admitting: Family Medicine

## 2019-07-17 ENCOUNTER — Other Ambulatory Visit: Payer: Self-pay

## 2019-07-17 DIAGNOSIS — D649 Anemia, unspecified: Secondary | ICD-10-CM

## 2019-07-17 MED ORDER — FERROUS SULFATE ER 140 (45 FE) MG PO TBCR: 1.0000 | EXTENDED_RELEASE_TABLET | Freq: Three times a day (TID) | ORAL | 1 refills | Status: DC

## 2019-07-18 ENCOUNTER — Other Ambulatory Visit: Payer: Self-pay

## 2019-07-18 ENCOUNTER — Ambulatory Visit (INDEPENDENT_AMBULATORY_CARE_PROVIDER_SITE_OTHER): Payer: Medicare HMO | Admitting: Family Medicine

## 2019-07-18 ENCOUNTER — Encounter: Payer: Self-pay | Admitting: Family Medicine

## 2019-07-18 ENCOUNTER — Other Ambulatory Visit: Payer: Self-pay | Admitting: Family Medicine

## 2019-07-18 ENCOUNTER — Other Ambulatory Visit (HOSPITAL_COMMUNITY)
Admission: RE | Admit: 2019-07-18 | Discharge: 2019-07-18 | Disposition: A | Discharge status: Home / Self Care | Payer: Medicare HMO | Source: Other Acute Inpatient Hospital | Attending: Family Medicine | Admitting: Family Medicine

## 2019-07-18 VITALS — BP 148/80 | HR 114 | Resp 18 | Ht 67.0 in | Wt 243.0 lb

## 2019-07-18 DIAGNOSIS — G8929 Other chronic pain: Secondary | ICD-10-CM

## 2019-07-18 DIAGNOSIS — E1149 Type 2 diabetes mellitus with other diabetic neurological complication: Secondary | ICD-10-CM

## 2019-07-18 DIAGNOSIS — Z0289 Encounter for other administrative examinations: Secondary | ICD-10-CM | POA: Diagnosis not present

## 2019-07-18 DIAGNOSIS — I10 Essential (primary) hypertension: Secondary | ICD-10-CM

## 2019-07-18 DIAGNOSIS — R110 Nausea: Secondary | ICD-10-CM | POA: Diagnosis not present

## 2019-07-18 DIAGNOSIS — Z79899 Other long term (current) drug therapy: Secondary | ICD-10-CM | POA: Diagnosis not present

## 2019-07-18 MED ORDER — ONDANSETRON HCL 4 MG/2ML IJ SOLN
4.0000 mg | Freq: Once | INTRAMUSCULAR | Status: AC
  Administered 2019-07-18: 4 mg via INTRAMUSCULAR

## 2019-07-18 NOTE — Progress Notes (Signed)
[name]     MRN: [id number]      DOB: 10-29-1940   HPI [name] is here for follow up and re-evaluation of chronic medical conditions, medication management and review of any available recent lab and radiology data.  Preventive health is updated, specifically  Cancer screening and Immunization.   Questions or concerns regarding consultations or procedures which the PT has had in the interim are  addressed. The PT denies any adverse reactions to current medications since the last visit.  C/o nausea, denies loose stool Denies polyuria, polydipsia, blurred vision , or hypoglycemic episodes. Blood sugar often under 200 Ongong back and knee pain , adequate control on current medication Not interested in lleaving home most of the time  ROS Denies recent fever or chills. Denies sinus pressure, nasal congestion, ear pain or sore throat. Denies chest congestion, productive cough or wheezing. Denies chest pains, palpitations and leg swelling Denies abdominal pain,  vomiting,diarrhea or constipation.    C/o chronic  joint pain, swelling and limitation in mobility. Denies uncontrolled headaches, seizures,c/o  Numbness and  tingling. Denies uncontrolled  depression, anxiety or insomnia. Denies skin break down or rash.   PE  BP (!) 148/80   Pulse (!) 114   Resp 18   Ht 5\' 7"  (1.702 m)   Wt 243 lb (110.2 kg)   SpO2 94%   BMI 38.06 kg/m   Patient alert and oriented and in no cardiopulmonary distress.  HEENT: No facial asymmetry, EOMI,     Neck decreased rOM .  Chest: Clear to auscultation bilaterally.  CVS: S1, S2 no murmurs, no S3.Regular rate.  ABD: Soft non tender.   Ext: No edema  MS: decreased  ROM spine, shoulders, hips and knees.  Skin: Intact, no ulcerations or rash noted.  Psych: Good eye contact, normal affect. not anxious or depressed appearing.  CNS: CN 2-12 intact, power,  normal throughout.no focal deficits noted.   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 is reminded of the importance of commitment to daily physical activity for 30 minutes or more, as able and the need to limit carbohydrate intake to 30 to 60 grams per meal to help with blood sugar control.   The need to take medication as prescribed, test blood sugar as directed, and to call between visits if there is a concern that blood sugar is uncontrolled is also discussed.   [name] is reminded of the importance of daily foot exam, annual eye examination, and good blood sugar, blood pressure and cholesterol control.  Diabetic Labs Latest Ref Rng & Units 05/22/2019 04/24/2019 04/23/2019 04/22/2019 04/21/2019  HbA1c <5.7 % of total Hgb - - - - -  Microalbumin mg/dL - - - - -  Micro/Creat Ratio <30 mcg/mg creat - - - - -  Chol <200 mg/dL - - - - -  HDL > OR = 50 mg/dL - - - - -  Calc LDL mg/dL (calc) - - - - -  Triglycerides <150 mg/dL - - - - -  Creatinine 0.60 - 0.93 mg/dL 1.09(H) 1.08(H) 1.24(H) 1.14(H) 1.24(H)   BP/Weight 07/18/2019 05/16/2019 04/24/2019 04/20/2019 03/19/2019 12/06/2018 [id number]  Systolic BP 99991111 0000000 [id number] - 0000000 - 0000000  Diastolic BP 123XX123 75 [id number] - 76 - 76  Wt. (Lbs) 243 235 - 235.23 239 239 239  BMI 38.06 36.81 - 36.84 38.58 38.58 37.43  Foot/eye exam completion dates Latest Ref Rng & Units 08/10/2017 07/14/2016  Eye Exam No Retinopathy - -  Foot exam Order - - -  Foot Form Completion - Done Done      Updated lab needed at/ before next visit.   Encounter for chronic pain management The patient's Controlled Substance registry is reviewed and compliance confirmed. Adequacy of  Pain control and level of function is assessed. Medication dosing is adjusted as deemed appropriate. Twelve weeks of medication is prescribed , follow up appointment between 11 to 12 weeks .   Essential hypertension Adequate control, no medication change  Morbid obesity  ([place])  Patient re-educated about  the importance of commitment to a  minimum of 150 minutes of exercise per week as able.  The importance of healthy food choices with portion control discussed, as well as eating regularly and within a 12 hour window most days. The need to choose "clean , green" food 50 to 75% of the time is discussed, as well as to make water the primary drink and set a goal of 64 ounces water daily.    Weight /BMI 07/18/2019 05/16/2019 04/20/2019  WEIGHT 243 lb 235 lb 235 lb 3.7 oz  HEIGHT 5\' 7"  5\' 7"  5\' 7"   BMI 38.06 kg/m2 36.81 kg/m2 36.84 kg/m2        Assessment & Plan:

## 2019-07-18 NOTE — Assessment & Plan Note (Signed)
Uncontrolled  [name] is reminded of the importance of commitment to daily physical activity for 30 minutes or more, as able and the need to limit carbohydrate intake to 30 to 60 grams per meal to help with blood sugar control.   The need to take medication as prescribed, test blood sugar as directed, and to call between visits if there is a concern that blood sugar is uncontrolled is also discussed.   [name] is reminded of the importance of daily foot exam, annual eye examination, and good blood sugar, blood pressure and cholesterol control.  Diabetic Labs Latest Ref Rng & Units 05/22/2019 04/24/2019 04/23/2019 04/22/2019 04/21/2019  HbA1c <5.7 % of total Hgb - - - - -  Microalbumin mg/dL - - - - -  Micro/Creat Ratio <30 mcg/mg creat - - - - -  Chol <200 mg/dL - - - - -  HDL > OR = 50 mg/dL - - - - -  Calc LDL mg/dL (calc) - - - - -  Triglycerides <150 mg/dL - - - - -  Creatinine 0.60 - 0.93 mg/dL 1.09(H) 1.08(H) 1.24(H) 1.14(H) 1.24(H)   BP/Weight 07/18/2019 05/16/2019 04/24/2019 04/20/2019 03/19/2019 12/06/2018 [id number]  Systolic BP 99991111 0000000 [id number] - 0000000 - 0000000  Diastolic BP 123XX123 75 [id number] - 76 - 76  Wt. (Lbs) 243 235 - 235.23 239 239 239  BMI 38.06 36.81 - 36.84 38.58 38.58 37.43   Foot/eye exam completion dates Latest Ref Rng & Units 08/10/2017 07/14/2016  Eye Exam No Retinopathy - -  Foot exam Order - - -  Foot Form Completion - Done Done      Updated lab needed at/ before next visit.

## 2019-07-18 NOTE — Assessment & Plan Note (Addendum)
Acute onset, zofran administered with good effect

## 2019-07-18 NOTE — Patient Instructions (Addendum)
Annual physical exam with mD in 3 months, also addressing pain mangment , call if you need me sooner  You will be referred to [facility] vision for diabetic eye exam  Urine test today (UDS)  HBA1C, chem 7 and eGFR in mid to end June  Zofran today for nausea  Thanks for choosing [facility], we consider it a privelige to serve you.

## 2019-07-19 ENCOUNTER — Encounter: Payer: Self-pay | Admitting: Family Medicine

## 2019-07-20 ENCOUNTER — Telehealth: Payer: Self-pay

## 2019-07-20 MED ORDER — OXYCODONE-ACETAMINOPHEN 10-325 MG PO TABS: ORAL_TABLET | ORAL | 0 refills | Status: AC

## 2019-07-20 NOTE — Telephone Encounter (Signed)
[name]- is calling to check on Pain Medication , are we waiting on anything?

## 2019-07-20 NOTE — Assessment & Plan Note (Signed)
The patient's Controlled Substance registry is reviewed and compliance confirmed. Adequacy of  Pain control and level of function is assessed. Medication dosing is adjusted as deemed appropriate. Twelve weeks of medication is prescribed , follow up appointment between 11 to 12 weeks .

## 2019-07-20 NOTE — Telephone Encounter (Signed)
Please advise her that I have sent in medication, I am still waiting on the result of the UDS which was sent at the visit, she may collect the first 1 month prescribed, sorry for 1 day delay!

## 2019-07-20 NOTE — Assessment & Plan Note (Signed)
Adequate control, no medication change 

## 2019-07-20 NOTE — Telephone Encounter (Signed)
Left message for [name] letting her know medication sent in for one month. Still waiting on test results.

## 2019-07-20 NOTE — Assessment & Plan Note (Signed)
  Patient re-educated about  the importance of commitment to a  minimum of 150 minutes of exercise per week as able.  The importance of healthy food choices with portion control discussed, as well as eating regularly and within a 12 hour window most days. The need to choose "clean , green" food 50 to 75% of the time is discussed, as well as to make water the primary drink and set a goal of 64 ounces water daily.    Weight /BMI 07/18/2019 05/16/2019 04/20/2019  WEIGHT 243 lb 235 lb 235 lb 3.7 oz  HEIGHT 5\' 7"  5\' 7"  5\' 7"   BMI 38.06 kg/m2 36.81 kg/m2 36.84 kg/m2

## 2019-07-21 LAB — MISC LABCORP TEST (SEND OUT): Labcorp test code: 738526

## 2019-07-27 ENCOUNTER — Other Ambulatory Visit: Payer: Self-pay | Admitting: Family Medicine

## 2019-07-27 DIAGNOSIS — R0902 Hypoxemia: Secondary | ICD-10-CM | POA: Diagnosis not present

## 2019-07-27 DIAGNOSIS — J42 Unspecified chronic bronchitis: Secondary | ICD-10-CM | POA: Diagnosis not present

## 2019-08-09 IMAGING — CR DG CHEST 1V PORT
1 series · 1 of 1 positions shown · non-contrast
Comparison: 08/13/2016

CLINICAL DATA: Fever

EXAM:
PORTABLE CHEST 1 VIEW

[portable]
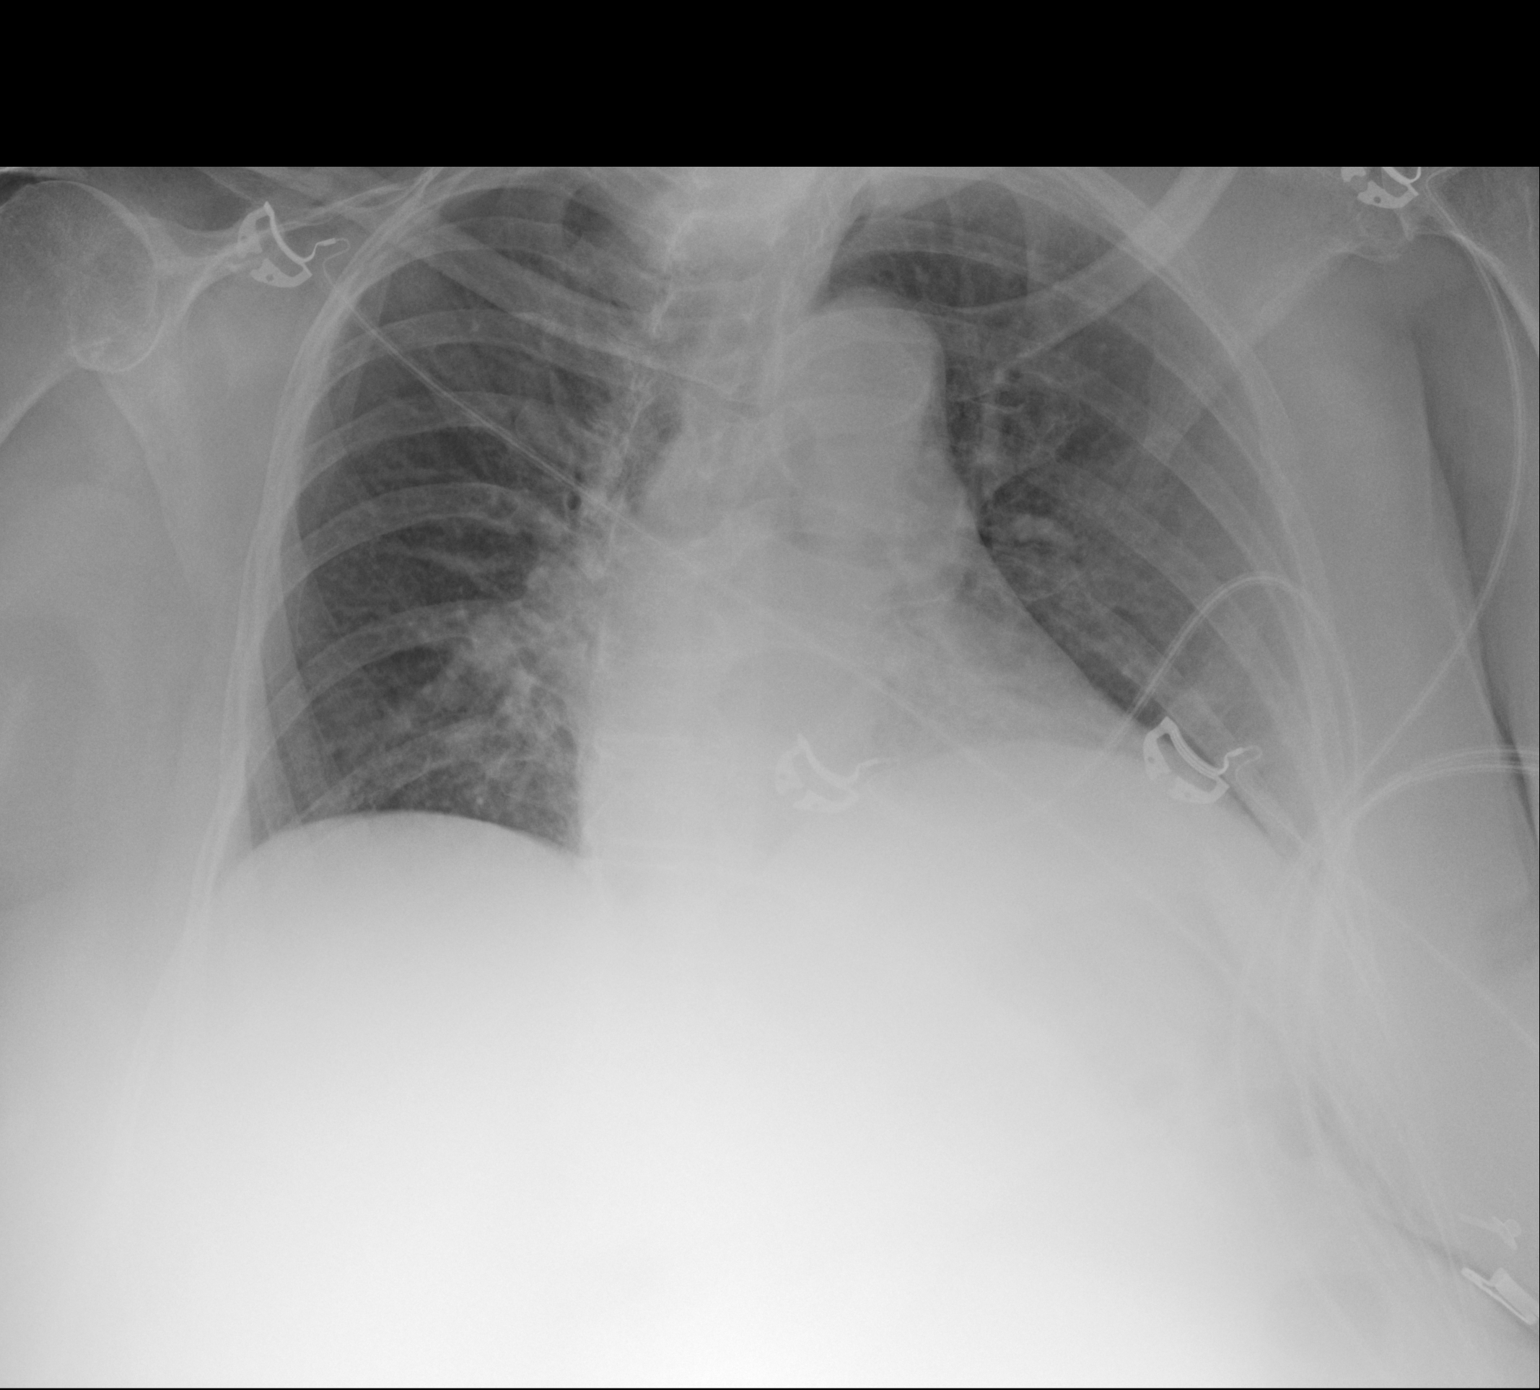

[1 of 1 positions shown; findings below may reference images not displayed]

FINDINGS: No focal airspace disease. Mild cardiomegaly with aortic
atherosclerosis. No pneumothorax.
IMPRESSION: No active disease.  Borderline to mild cardiomegaly.

## 2019-08-10 ENCOUNTER — Other Ambulatory Visit: Payer: Self-pay | Admitting: Family Medicine

## 2019-08-11 IMAGING — CR DG CHEST 1V PORT
1 series · 1 of 1 positions shown · non-contrast
Comparison: 06/18/2017, 08/13/2016 and earlier.

CLINICAL DATA: Acute onset of fever, shortness of breath and
wheezing.

EXAM:
PORTABLE CHEST 1 VIEW

[portable]
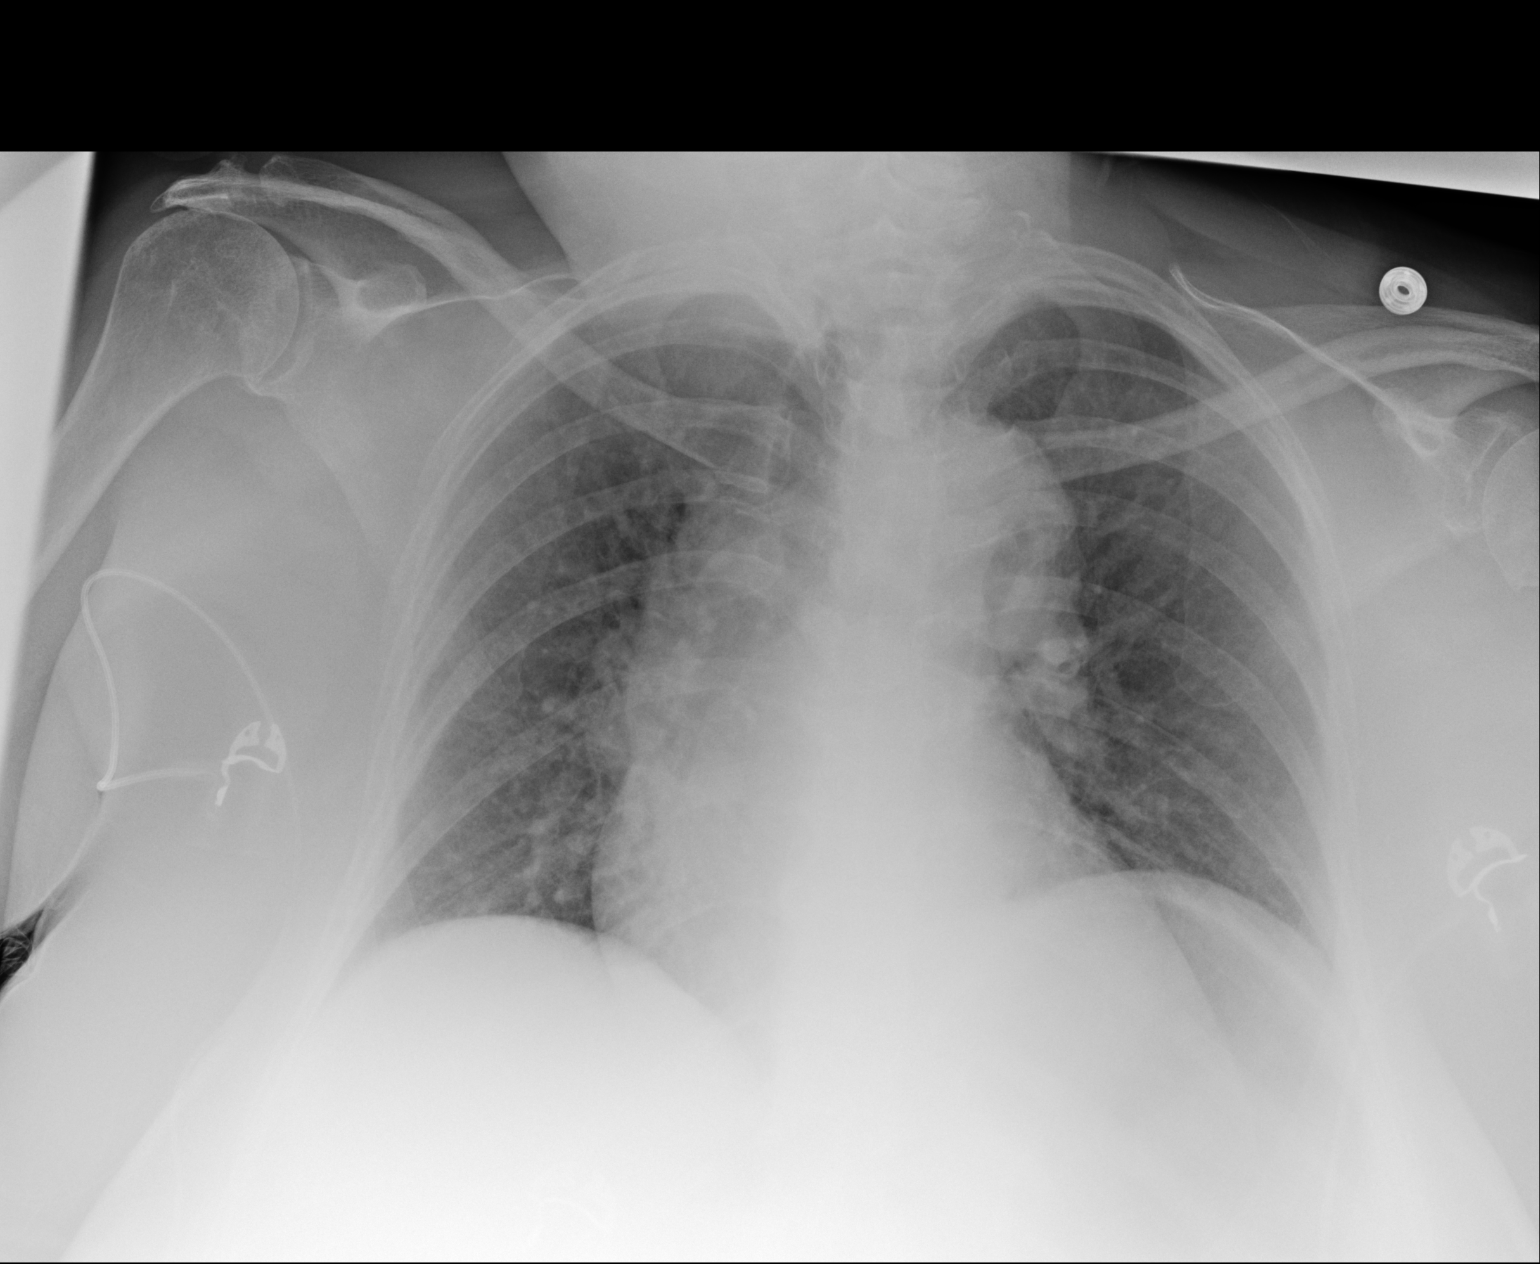

[1 of 1 positions shown; findings below may reference images not displayed]

FINDINGS: Suboptimal inspiration accounts for crowded bronchovascular
markings, especially in the bases, and accentuates the cardiac
silhouette. Taking this into account, cardiac silhouette upper
normal in size to slightly enlarged, unchanged. Thoracic aorta
atherosclerotic, unchanged. Hilar and mediastinal contours otherwise
unremarkable. Mild pulmonary venous hypertension without overt
edema, unchanged since the examination 2 days ago. No new pulmonary
parenchymal abnormalities.
IMPRESSION: Suboptimal inspiration. Stable borderline to mild cardiomegaly. No
acute cardiopulmonary disease.

## 2019-08-14 ENCOUNTER — Ambulatory Visit: Payer: Medicare HMO | Admitting: Family Medicine

## 2019-08-26 DIAGNOSIS — J42 Unspecified chronic bronchitis: Secondary | ICD-10-CM | POA: Diagnosis not present

## 2019-08-26 DIAGNOSIS — R0902 Hypoxemia: Secondary | ICD-10-CM | POA: Diagnosis not present

## 2019-08-27 ENCOUNTER — Other Ambulatory Visit: Payer: Self-pay | Admitting: Family Medicine

## 2019-08-27 DIAGNOSIS — E1149 Type 2 diabetes mellitus with other diabetic neurological complication: Secondary | ICD-10-CM | POA: Diagnosis not present

## 2019-08-28 ENCOUNTER — Other Ambulatory Visit: Payer: Self-pay

## 2019-08-28 LAB — BASIC METABOLIC PANEL WITH GFR
BUN/Creatinine Ratio: 12 (calc) (ref 6–22)
BUN: 14 mg/dL (ref 7–25)
CO2: 24 mmol/L (ref 20–32)
Calcium: 9.5 mg/dL (ref 8.6–10.4)
Chloride: 102 mmol/L (ref 98–110)
Creat: 1.18 mg/dL — ABNORMAL HIGH (ref 0.60–0.93)
GFR, Est African American: 51 mL/min/{1.73_m2} — ABNORMAL LOW (ref >=60)
GFR, Est Non African American: 44 mL/min/{1.73_m2} — ABNORMAL LOW (ref >=60)
Glucose, Bld: 217 mg/dL — ABNORMAL HIGH (ref 65–139)
Potassium: 4 mmol/L (ref 3.5–5.3)
Sodium: 136 mmol/L (ref 135–146)

## 2019-08-28 LAB — HEMOGLOBIN A1C
Hgb A1c MFr Bld: 8.5 % of total Hgb — ABNORMAL HIGH (ref <5.7)
Mean Plasma Glucose: 197 (calc)
eAG (mmol/L): 10.9 (calc)

## 2019-08-28 MED ORDER — NOVOLIN 70/30 (70-30) 100 UNIT/ML ~~LOC~~ SUSP: 45.0000 [IU] | Freq: Two times a day (BID) | SUBCUTANEOUS | 6 refills | Status: DC

## 2019-09-10 ENCOUNTER — Telehealth (INDEPENDENT_AMBULATORY_CARE_PROVIDER_SITE_OTHER): Payer: Medicare HMO

## 2019-09-10 ENCOUNTER — Other Ambulatory Visit: Payer: Self-pay

## 2019-09-10 ENCOUNTER — Telehealth: Payer: Self-pay

## 2019-09-10 VITALS — BP 148/80 | Ht 67.0 in | Wt 243.0 lb

## 2019-09-10 DIAGNOSIS — Z0000 Encounter for general adult medical examination without abnormal findings: Secondary | ICD-10-CM

## 2019-09-10 NOTE — Progress Notes (Signed)
Subjective:   [name] is a 79 y.o. female who presents for Medicare Annual (Subsequent) preventive examination.  Review of Systems     Cardiac Risk Factors include: diabetes mellitus;dyslipidemia;hypertension;obesity (BMI >30kg/m2);sedentary lifestyle     Objective:    Today's Vitals   09/10/19 1349 09/10/19 1359  BP: (!) 148/80   Weight: (!) 243 lb (110.2 kg)   Height: 5\' 7"  (1.702 m)   PainSc:  8    Body mass index is 38.06 kg/m.  Advanced Directives 04/20/2019 04/20/2019 11/11/2018 11/10/2018 03/21/2018 03/17/2018 02/25/2018  Does Patient Have a Medical Advance Directive? No No Yes Yes Yes Yes Yes  Type of Advance Directive - - Healthcare Power of [place]  Does patient want to make changes to medical advance directive? - - No - Patient declined No - Patient declined No - Patient declined No - Patient declined No - Patient declined  Copy of [place] in Chart? - - - - No - copy requested No - copy requested No - copy requested  Would patient like information on creating a medical advance directive? No - Patient declined - - - - - -  Pre-existing out of facility DNR order (yellow form or pink MOST form) - - - - - - -    Current Medications (verified) Outpatient Encounter Medications as of 09/10/2019  Medication Sig  . acetaminophen (TYLENOL) 500 MG tablet Take one tablet two times daily for chronic pain (Patient taking differently: Take 500 mg by mouth in the morning and at bedtime. for chronic pain)  . albuterol (VENTOLIN HFA) 108 (90 Base) MCG/ACT inhaler Inhale 2 puffs into the lungs every 6 (six) hours as needed for wheezing or shortness of breath.  Marland Kitchen amLODipine (NORVASC) 10 MG tablet TAKE 1 TABLET BY MOUTH ONCE DAILY.  Marland Kitchen atorvastatin (LIPITOR) 40 MG tablet Take 1 tablet (40 mg total) by mouth daily.  . busPIRone (BUSPAR) 7.5 MG tablet TAKE  (1) TABLET BY MOUTH THREE TIMES DAILY  . cloNIDine (CATAPRES) 0.2 MG tablet TAKE ONE TABLET BY MOUTH AT BEDTIME.  . clotrimazole-betamethasone (LOTRISONE) cream Apply 1 application topically 2 (two) times daily. D/c vusion ointment  . COMBIGAN 0.2-0.5 % ophthalmic solution Place 1 drop into both eyes 2 (two) times daily.  Marland Kitchen conjugated estrogens (PREMARIN) vaginal cream Place vaginally 2 (two) times a week. Apply sparingly ( fingertip)  Twice weekly to vaginal area on Wednesdays and Fridays  . dabigatran (PRADAXA) 150 MG CAPS capsule Take 1 capsule (150 mg total) by mouth 2 (two) times daily.  Marland Kitchen docusate sodium (COLACE) 100 MG capsule Take 1 capsule (100 mg total) by mouth daily as needed for mild constipation. (Patient taking differently: Take 100 mg by mouth daily. )  . donepezil (ARICEPT) 10 MG tablet TAKE ONE TABLET BY MOUTH AT BEDTIME.  Marland Kitchen Ferrous Sulfate (IRON SLOW RELEASE) 140 (45 Fe) MG TBCR Take 1 tablet by mouth with breakfast, with lunch, and with evening meal.  . Fluoxetine HCl, PMDD, 10 MG TABS Take 1 tablet (10 mg total) by mouth daily. (Patient not taking: Reported on 07/18/2019)  . glucose blood (ACCU-CHEK AVIVA PLUS) test strip USE TO TEST BLOOD SUGAR TWICE DAILY.  . hydrOXYzine (VISTARIL) 25 MG capsule TAKE (1) CAPSULE BY MOUTH THREE TIMES DAILY  . imipramine (TOFRANIL) 50 MG tablet TAKE 2 TABLETS (100 MG TOTAL) BY MOUTH AT BEDTIME.  . insulin NPH-regular  Human (NOVOLIN 70/30) (70-30) 100 UNIT/ML injection Inject 45 Units into the skin 2 (two) times daily with a meal.  . Insulin Syringe-Needle U-100 32G X 5/16" 1 ML MISC 1 each by Does not apply route in the morning and at bedtime. Use to inject insulin twice daily dx e11.65  . meclizine (ANTIVERT) 25 MG tablet TAKE 1 TABLET THREE TIMES DAILY AS NEEDED FOR DIZZINESS. (Patient taking differently: Take 25 mg by mouth 3 (three) times daily as needed for dizziness. )  . montelukast (SINGULAIR) 10 MG tablet TAKE ONE TABLET BY MOUTH AT  BEDTIME.  Marland Kitchen nystatin (MYCOSTATIN/NYSTOP) powder Apply topically daily as needed (for rash under breasts).  Marland Kitchen olopatadine (PATANOL) 0.1 % ophthalmic solution Place 1 drop into both eyes 2 (two) times daily.  Marland Kitchen omeprazole (PRILOSEC) 40 MG capsule TAKE (1) CAPSULE BY MOUTH TWICE DAILY.  Marland Kitchen oxyCODONE-acetaminophen (PERCOCET) 10-325 MG tablet Take one tablet by mouth two times daily for chronic back pain  . [START ON 09/19/2019] oxyCODONE-acetaminophen (PERCOCET) 10-325 MG tablet Take one tablet by mouth two times daily for chronic back pain  . SUMAtriptan (IMITREX) 25 MG tablet TAKE 1 TABLET BY MOUTH AS NEEDED FOR MIGRAINE. MAY REPEAT IN 2 HOURS IF HEADACHE PERSISTS OR RECURS.  Marland Kitchen topiramate (TOPAMAX) 100 MG tablet TAKE 1 TABLET BY MOUTH TWICE DAILY   Facility-Administered Encounter Medications as of 09/10/2019  Medication  . fentaNYL (SUBLIMAZE) injection 25-50 mcg    Allergies (verified) Penicillins, Fentanyl, Sulfonamide derivatives, and Codeine   History: Past Medical History:  Diagnosis Date  . Anemia, iron deficiency 2012   Evaluated by Dr. Oneida Alar; H&H of 9.3/30.8 with [place] in 10/2010; 3/3 positive Hemoccult cards in 07/2011  . Anxiety    was on Prozac for a couple of weeks  . Atrial fibrillation ([place])    takes Coumadin daily  . Bruises easily    takes Coumadin  . Chronic anticoagulation 07/21/2010  . Chronic back pain    related to knee pain  . Degenerative joint disease    Knees  . Dementia ([place])   . Depression   . Diabetes mellitus    takes Metformin daily  . Gastroesophageal reflux disease    takes Omeprazole daily  . Glaucoma   . Glaucoma    uses eye drops at night  . History of blood transfusion 1969  . History of gout    was on medication but taken off of several months ago  . Hx of colonic polyps   . Hx of migraines    last one about a yr ago;takes Topamax daily  . Hyperlipidemia    takes Pravastatin daily  . Hypertension    takes Hyzaar daily and Propranolol     . Insomnia    takes Restoril prn  . Joint pain   . Joint swelling   . Memory loss    takes donepezil  . Migraine   . Migraine   . Nocturia   . Overweight(278.02)   . Pneumonia 1969   hx of  . Pulmonary embolism [facility]) May 2012   acute presentation, bilateral PE  . Skin spots-aging   . Urinary frequency    Past Surgical History:  Procedure Laterality Date  . BIOPSY  04/22/2019   Procedure: BIOPSY;  Surgeon: Daneil Dolin, MD;  [facility];  Service: Endoscopy;;  duodenum  . CATARACT EXTRACTION W/PHACO Right 04/11/2012   Procedure: CATARACT EXTRACTION PHACO AND INTRAOCULAR LENS PLACEMENT (IOC);  Surgeon: Elta Guadeloupe T. Gershon Crane, MD;  Location: [facility];  Service: Ophthalmology;  Laterality: Right;  CDE=9.61  . CATARACT EXTRACTION W/PHACO Left 12/26/2012   Procedure: CATARACT EXTRACTION PHACO AND INTRAOCULAR LENS PLACEMENT (IOC);  Surgeon: Elta Guadeloupe T. Gershon Crane, MD;  Location: [facility];  Service: Ophthalmology;  Laterality: Left;  CDE:7.74  . COLONOSCOPY  Jan 2002; 2012   2002: Dr. Tamala Julian, ext. hemorrhoids, nl colon; 2012-adenomatous polyps, gastritis on EGD  . COLONOSCOPY N/A 04/24/2019   Procedure: COLONOSCOPY;  Surgeon: Rogene Houston, MD;  [facility];  Service: Endoscopy;  Laterality: N/A;  . CYSTOSCOPY W/ URETERAL STENT PLACEMENT Bilateral 02/25/2018   Procedure: CYSTOSCOPY WITH BILATERAL RETROGRADE PYELOGRAM/BILATERAL URETERAL STENT PLACEMENT, BLADDER BIOPSIES WITH FULGERATION;  Surgeon: [name], MD;  Location: [facility];  Service: Urology;  Laterality: Bilateral;  . CYSTOSCOPY/URETEROSCOPY/HOLMIUM LASER/STENT PLACEMENT Bilateral 03/21/2018   Procedure: CYSTOSCOPY/URETEROSCOPY/HOLMIUM LASER/STENT PLACEMENT;  Surgeon: [name], MD;  Location: [facility];  Service: Urology;  Laterality: Bilateral;  ONLY NEEDS 60 MIN  . DILATION AND CURETTAGE OF UTERUS    . ESOPHAGOGASTRODUODENOSCOPY  08/24/2011   ESOPHAGOGASTRODUODENOSCOPY; esophageal dilatation;  Rogene Houston, MD;  . ESOPHAGOGASTRODUODENOSCOPY N/A 04/22/2019   Procedure: ESOPHAGOGASTRODUODENOSCOPY (EGD);  Surgeon: Daneil Dolin, MD;  [facility];  Service: Endoscopy;  Laterality: N/A;  . GIVENS CAPSULE STUDY  08/18/2011   Procedure: GIVENS CAPSULE STUDY;  Surgeon: Rogene Houston, MD;  [facility];  Service: Endoscopy;  Laterality: N/A;  730  . KNEE ARTHROSCOPY W/ MENISCECTOMY  90's   Left  . MALONEY DILATION  04/22/2019   Procedure: MALONEY DILATION;  Surgeon: Daneil Dolin, MD;  [facility];  Service: Endoscopy;;  . ORIF ANKLE FRACTURE Right 90's  . TONSILLECTOMY    . TOTAL KNEE ARTHROPLASTY  10/20/2011   Procedure: TOTAL KNEE ARTHROPLASTY;  Surgeon: Ninetta Lights, MD;  Location: [facility];  Service: Orthopedics;  Laterality: Left;  . UPPER GASTROINTESTINAL ENDOSCOPY    . URETHRAL DILATION     Family History  Problem Relation Age of Onset  . Diabetes Mother   . Hypertension Mother   . Arthritis Mother   . Heart failure Mother   . Migraines Mother   . Kidney failure Mother   . Leukemia Father   . Migraines Father   . Kidney failure Father   . Diabetes Sister   . Hypertension Sister   . Diabetes Brother   . Hypertension Brother   . Hypertension Brother   . Hypertension Brother   . Hypertension Brother   . Diabetes Brother   . Diabetes Brother   . Diabetes Brother   . Pulmonary embolism Sister   . Migraines Sister   . Kidney failure Brother   . Colon cancer Neg Hx   . Colon polyps Neg Hx    Social History   Socioeconomic History  . Marital status: Widowed    Spouse name: Not on file  . Number of children: 2  . Years of education: 46  . Highest education level: Not on file  Occupational History  . 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 Smoking status: Never Smoker  . Smokeless tobacco: Never Used  Vaping Use  . Vaping Use: Never used  Substance and Sexual Activity  .  Alcohol use: No  . Drug use: No  . Sexual activity: Not Currently    Birth control/protection: Post-menopausal  Other Topics Concern  . Not  on file  Social History Narrative   Lives at home with husband.   Right-handed.   1 cup caffeine per day.   Social Determinants of Health   Financial Resource Strain: Low Risk   . Difficulty of Paying Living Expenses: Not very hard  Food Insecurity: No Food Insecurity  . Worried About Charity fundraiser in the Last Year: Never true  . Ran Out of Food in the Last Year: Never true  Transportation Needs: No Transportation Needs  . Lack of Transportation (Medical): No  . Lack of Transportation (Non-Medical): No  Physical Activity: Inactive  . Days of Exercise per Week: 0 days  . Minutes of Exercise per Session: 0 min  Stress: No Stress Concern Present  . Feeling of Stress : Only a little  Social Connections: Moderately Isolated  . Frequency of Communication with Friends and Family: More than three times a week  . Frequency of Social Gatherings with Friends and Family: Once a week  . Attends Religious Services: More than 4 times per year  . Active Member of Clubs or Organizations: No  . Attends Archivist Meetings: Never  . Marital Status: Widowed    Tobacco Counseling Counseling given: Not Answered   Clinical Intake:  Pre-visit preparation completed: Yes  Pain : 0-10 Pain Score: 8  Pain Type: Chronic pain     Nutritional Status: BMI > 30  Obese Diabetes: Yes CBG done?: No Did pt. bring in CBG monitor from home?: No  How often do you need to have someone help you when you read instructions, pamphlets, or other written materials from your doctor or pharmacy?: 4 - Often  Diabetic? yes  Interpreter Needed?: No      Activities of Daily Living In your present state of health, do you have any difficulty performing the following activities: 09/10/2019 04/20/2019  Hearing? N N  Vision? N N  Difficulty concentrating or  making decisions? Tempie Donning  Walking or climbing stairs? Y Y  Dressing or bathing? Y Y  Doing errands, shopping? Tempie Donning  Preparing Food and eating ? Y -  Using the Toilet? Y -  In the past six months, have you accidently leaked urine? Y -  Do you have problems with loss of bowel control? N -  Managing your Medications? Y -  Managing your Finances? Y -  Housekeeping or managing your Housekeeping? Y -  Some recent data might be hidden    Patient Care Team: [name], MD as PCP - General (Family Medicine) Harl Bowie, Alphonse Guild, MD as PCP - Cardiology (Cardiology) Rogene Houston, MD (Gastroenterology) Renette Butters, MD as Attending Physician (Orthopedic Surgery)  Indicate any recent Medical Services you may have received from other than [facility] providers in the past year (date may be approximate).     Assessment:   This is a routine wellness examination for Kylin.  Hearing/Vision screen No exam data present  Dietary issues and exercise activities discussed: Current Exercise Habits: The patient does not participate in regular exercise at present, Exercise limited by: orthopedic condition(s)  Goals    . Blood Pressure < 140/90    . HEMOGLOBIN A1C < 7     Reduce carbs and sweets in diet    . LIFESTYLE - DECREASE FALLS RISK      Depression Screen PHQ 2/9 Scores 09/10/2019 05/16/2019 03/19/2019 01/26/2019 11/16/2018 08/09/2018 07/03/2018  PHQ - 2 Score 2 0 - 0 0 0 1  PHQ- 9 Score 4  3 - - - - -  Exception Documentation - - Other- indicate reason in comment box - - - -  Not completed - - patient was not yet available at virtual visit- - - - -    Fall Risk Fall Risk  05/16/2019 03/19/2019 01/26/2019 12/04/2018 11/16/2018  Falls in the past year? 1 1 0 0 0  Number falls in past yr: 1 1 0 0 0  Injury with Fall? 0 1 0 0 0  Risk for fall due to : - - - - -  Follow up - - - - -    Any stairs in or around the home? No  If so, are there any without handrails? No  Home free of loose  throw rugs in walkways, pet beds, electrical cords, etc? Yes  Adequate lighting in your home to reduce risk of falls? Yes   ASSISTIVE DEVICES UTILIZED TO PREVENT FALLS:  Life alert? no Use of a cane, walker or w/c? Yes  Grab bars in the bathroom? Yes  Shower chair or bench in shower? No  Elevated toilet seat or a handicapped toilet? Yes   TIMED UP AND GO:  Was the test performed? No .  Length of time to ambulate 10 feet:  sec.     Cognitive Function: MMSE - Mini Mental State Exam 04/23/2014  Orientation to time 2  Orientation to Place 2  Registration 0  Attention/ Calculation 0  Recall 0  Language- name 2 objects 2  Language- repeat 1  Language- follow 3 step command 3  Language- read & follow direction 1  Write a sentence 1  Copy design 1  Total score 13     6CIT Screen 09/10/2019 08/09/2018 08/02/2017  What Year? 0 points 4 points 0 points  What month? 0 points 0 points 0 points  What time? 0 points 0 points 0 points  Count back from 20 0 points 0 points 0 points  Months in reverse 2 points 0 points 0 points  Repeat phrase 2 points 0 points 4 points  Total Score 4 4 4     Immunizations Immunization History  Administered Date(s) Administered  . Fluad Quad(high Dose 65+) 12/04/2018  . H1N1 12/19/2007  . Influenza Split 11/23/2011  . Influenza Whole 01/07/2004, 11/14/2007, 02/03/2009, 11/30/2009  . Influenza, High Dose Seasonal PF 12/21/2017  . Influenza,inj,Quad PF,6+ Mos 11/01/2012, 01/01/2014, 04/24/2015, 10/29/2015, 01/11/2017  . PFIZER SARS-COV-2 Vaccination 04/12/2019, 04/30/2019  . Pneumococcal Conjugate-13 04/23/2014  . Pneumococcal Polysaccharide-23 08/05/2010  . Td 11/07/2003  . Zoster 04/12/2006    TDAP status: Up to date Flu Vaccine status: Up to date Pneumococcal vaccine status: Up to date Covid-19 vaccine status: Completed vaccines  Qualifies for Shingles Vaccine? Yes   Zostavax completed Yes   Shingrix Completed?: No.    Education has been  provided regarding the importance of this vaccine. Patient has been advised to call insurance company to determine out of pocket expense if they have not yet received this vaccine. Advised may also receive vaccine at local pharmacy or Health Dept. Verbalized acceptance and understanding.  Screening Tests Health Maintenance  Topic Date Due  . OPHTHALMOLOGY EXAM  09/10/2019 (Originally 05/19/2017)  . TETANUS/TDAP  07/14/2022 (Originally 11/06/2013)  . INFLUENZA VACCINE  09/16/2019  . HEMOGLOBIN A1C  02/27/2020  . FOOT EXAM  07/19/2020  . DEXA SCAN  Completed  . COVID-19 Vaccine  Completed  . Hepatitis C Screening  Completed  . PNA vac Low Risk Adult  Completed  Health Maintenance  There are no preventive care reminders to display for this patient.  Colorectal cancer screening: Completed 2021. Repeat every 10 years (no further testing needed) Mammogram status: No longer required.  Bone density- completed once- no longer required   Lung Cancer Screening: (Low Dose CT Chest recommended if Age 57-80 years, 30 pack-year currently smoking OR have quit w/in 15years.) does not qualify.   Lung Cancer Screening Referral: no Additional Screening:  Hepatitis C Screening: does qualify; Completed yes  Vision Screening: Recommended annual ophthalmology exams for early detection of glaucoma and other disorders of the eye. Is the patient up to date with their annual eye exam?  No  Who is the provider or what is the name of the office in which the patient attends annual eye exams? My eye dr- Sister states she will take her when the patient is able to get out of the house.  If pt is not established with a provider, would they like to be referred to a provider to establish care? No .   Dental Screening: Recommended annual dental exams for proper oral hygiene  Community Resource Referral / Chronic Care Management: CRR required this visit?  No   CCM required this visit?  No      Plan:     I  have personally reviewed and noted the following in the patient's chart:   . Medical and social history . Use of alcohol, tobacco or illicit drugs  . Current medications and supplements . Functional ability and status . Nutritional status . Physical activity . Advanced directives . List of other physicians . Hospitalizations, surgeries, and ER visits in previous 12 months . Vitals . Screenings to include cognitive, depression, and falls . Referrals and appointments  In addition, I have reviewed and discussed with patient certain preventive protocols, quality metrics, and best practice recommendations. A written personalized care plan for preventive services as well as general preventive health recommendations were provided to patient.     Kate Sable, LPN, LPN   04/15/6008   Nurse Notes: visit done by telephone. Patient is at home assisted by her sister, [name]. Provider in office and time spent with patient- 30 mins

## 2019-09-10 NOTE — Telephone Encounter (Signed)
LET us SEE IF LANTUS OR ANY LONG ACTING INSULIN IS COVERED BY HER INSURANCE THAT WILL BE ONCE DAILY, PLS LET HER KNOW YOU ARE WORKING ON THIS

## 2019-09-10 NOTE — Patient Instructions (Signed)
[name] , Thank you for taking time to come for your Medicare Wellness Visit. I appreciate your ongoing commitment to your health goals. Please review the following plan we discussed and let me know if I can assist you in the future.   Screening recommendations/referrals: Colonoscopy: up to date- no further testing needed Mammogram: no longer needed Bone Density: no longer needed Recommended yearly ophthalmology/optometry visit for glaucoma screening and checkup Recommended yearly dental visit for hygiene and checkup  Vaccinations: Influenza vaccine: up to date  Pneumococcal vaccine: up to date  Tdap vaccine: not covered as preventative vaccine Shingles vaccine: information enclosed    Advanced directives: form enclosed    Next appointment: wellness in 1 year   Preventive Care 24 Years and Older, Female Preventive care refers to lifestyle choices and visits with your health care provider that can promote health and wellness. What does preventive care include?  A yearly physical exam. This is also called an annual well check.  Dental exams once or twice a year.  Routine eye exams. Ask your health care provider how often you should have your eyes checked.  Personal lifestyle choices, including:  Daily care of your teeth and gums.  Regular physical activity.  Eating a healthy diet.  Avoiding tobacco and drug use.  Limiting alcohol use.  Practicing safe sex.  Taking low-dose aspirin every day.  Taking vitamin and mineral supplements as recommended by your health care provider. What happens during an annual well check? The services and screenings done by your health care provider during your annual well check will depend on your age, overall health, lifestyle risk factors, and family history of disease. Counseling  Your health care provider may ask you questions about your:  Alcohol use.  Tobacco use.  Drug use.  Emotional well-being.  Home and relationship  well-being.  Sexual activity.  Eating habits.  History of falls.  Memory and ability to understand (cognition).  Work and work Statistician.  Reproductive health. Screening  You may have the following tests or measurements:  Height, weight, and BMI.  Blood pressure.  Lipid and cholesterol levels. These may be checked every 5 years, or more frequently if you are over 60 years old.  Skin check.  Lung cancer screening. You may have this screening every year starting at age 32 if you have a 30-pack-year history of smoking and currently smoke or have quit within the past 15 years.  Fecal occult blood test (FOBT) of the stool. You may have this test every year starting at age 82.  Flexible sigmoidoscopy or colonoscopy. You may have a sigmoidoscopy every 5 years or a colonoscopy every 10 years starting at age 41.  Hepatitis C blood test.  Hepatitis B blood test.  Sexually transmitted disease (STD) testing.  Diabetes screening. This is done by checking your blood sugar (glucose) after you have not eaten for a while (fasting). You may have this done every 1-3 years.  Bone density scan. This is done to screen for osteoporosis. You may have this done starting at age 75.  Mammogram. This may be done every 1-2 years. Talk to your health care provider about how often you should have regular mammograms. Talk with your health care provider about your test results, treatment options, and if necessary, the need for more tests. Vaccines  Your health care provider may recommend certain vaccines, such as:  Influenza vaccine. This is recommended every year.  Tetanus, diphtheria, and acellular pertussis (Tdap, Td) vaccine. You may need  a Td booster every 10 years.  Zoster vaccine. You may need this after age 37.  Pneumococcal 13-valent conjugate (PCV13) vaccine. One dose is recommended after age 79.  Pneumococcal polysaccharide (PPSV23) vaccine. One dose is recommended after age  33. Talk to your health care provider about which screenings and vaccines you need and how often you need them. This information is not intended to replace advice given to you by your health care provider. Make sure you discuss any questions you have with your health care provider. Document Released: 02/28/2015 Document Revised: 10/22/2015 Document Reviewed: 12/03/2014 Elsevier Interactive Patient Education  2017 [place] Prevention in the Home Falls can cause injuries. They can happen to people of all ages. There are many things you can do to make your home safe and to help prevent falls. What can I do on the outside of my home?  Regularly fix the edges of walkways and driveways and fix any cracks.  Remove anything that might make you trip as you walk through a door, such as a raised step or threshold.  Trim any bushes or trees on the path to your home.  Use bright outdoor lighting.  Clear any walking paths of anything that might make someone trip, such as rocks or tools.  Regularly check to see if handrails are loose or broken. Make sure that both sides of any steps have handrails.  Any raised decks and porches should have guardrails on the edges.  Have any leaves, snow, or ice cleared regularly.  Use sand or salt on walking paths during winter.  Clean up any spills in your garage right away. This includes oil or grease spills. What can I do in the bathroom?  Use night lights.  Install grab bars by the toilet and in the tub and shower. Do not use towel bars as grab bars.  Use non-skid mats or decals in the tub or shower.  If you need to sit down in the shower, use a plastic, non-slip stool.  Keep the floor dry. Clean up any water that spills on the floor as soon as it happens.  Remove soap buildup in the tub or shower regularly.  Attach bath mats securely with double-sided non-slip rug tape.  Do not have throw rugs and other things on the floor that can make  you trip. What can I do in the bedroom?  Use night lights.  Make sure that you have a light by your bed that is easy to reach.  Do not use any sheets or blankets that are too big for your bed. They should not hang down onto the floor.  Have a firm chair that has side arms. You can use this for support while you get dressed.  Do not have throw rugs and other things on the floor that can make you trip. What can I do in the kitchen?  Clean up any spills right away.  Avoid walking on wet floors.  Keep items that you use a lot in easy-to-reach places.  If you need to reach something above you, use a strong step stool that has a grab bar.  Keep electrical cords out of the way.  Do not use floor polish or wax that makes floors slippery. If you must use wax, use non-skid floor wax.  Do not have throw rugs and other things on the floor that can make you trip. What can I do with my stairs?  Do not leave any items on the  stairs.  Make sure that there are handrails on both sides of the stairs and use them. Fix handrails that are broken or loose. Make sure that handrails are as long as the stairways.  Check any carpeting to make sure that it is firmly attached to the stairs. Fix any carpet that is loose or worn.  Avoid having throw rugs at the top or bottom of the stairs. If you do have throw rugs, attach them to the floor with carpet tape.  Make sure that you have a light switch at the top of the stairs and the bottom of the stairs. If you do not have them, ask someone to add them for you. What else can I do to help prevent falls?  Wear shoes that:  Do not have high heels.  Have rubber bottoms.  Are comfortable and fit you well.  Are closed at the toe. Do not wear sandals.  If you use a stepladder:  Make sure that it is fully opened. Do not climb a closed stepladder.  Make sure that both sides of the stepladder are locked into place.  Ask someone to hold it for you, if  possible.  Clearly mark and make sure that you can see:  Any grab bars or handrails.  First and last steps.  Where the edge of each step is.  Use tools that help you move around (mobility aids) if they are needed. These include:  Canes.  Walkers.  Scooters.  Crutches.  Turn on the lights when you go into a dark area. Replace any light bulbs as soon as they burn out.  Set up your furniture so you have a clear path. Avoid moving your furniture around.  If any of your floors are uneven, fix them.  If there are any pets around you, be aware of where they are.  Review your medicines with your doctor. Some medicines can make you feel dizzy. This can increase your chance of falling. Ask your doctor what other things that you can do to help prevent falls. This information is not intended to replace advice given to you by your health care provider. Make sure you discuss any questions you have with your health care provider. Document Released: 11/28/2008 Document Revised: 07/10/2015 Document Reviewed: 03/08/2014 Elsevier Interactive Patient Education  2017 [employer].

## 2019-09-10 NOTE — Telephone Encounter (Signed)
[name] wants to know if it was possible for [name] to go back on pills instead of insulin twice daily because [name] cannot get down there two times a day to give it to her. Wants to know if there is another option (tradjenta) or if she can give her both insulin doses at once. Please advise

## 2019-09-11 ENCOUNTER — Other Ambulatory Visit: Payer: Self-pay | Admitting: Family Medicine

## 2019-09-11 DIAGNOSIS — D649 Anemia, unspecified: Secondary | ICD-10-CM

## 2019-09-11 NOTE — Telephone Encounter (Signed)
I looked up her formulary and it appears that lantus solostar and levemir flextouch ARE covered but at a tier 3 copay. I am not sure if that will be affordable. A prescription has to be sent in so they can run it to be able to find out the cost to the pt.

## 2019-09-12 ENCOUNTER — Other Ambulatory Visit: Payer: Self-pay | Admitting: Family Medicine

## 2019-09-12 MED ORDER — LANTUS SOLOSTAR 100 UNIT/ML ~~LOC~~ SOPN: 60.0000 [IU] | PEN_INJECTOR | Freq: Every day | SUBCUTANEOUS | 99 refills | Status: DC

## 2019-09-12 NOTE — Progress Notes (Signed)
lantus

## 2019-09-12 NOTE — Telephone Encounter (Signed)
Lantus 60 units daily has been sent in please f/u and let me know

## 2019-09-12 NOTE — Telephone Encounter (Signed)
[name] aware and will call back if it is too expensive

## 2019-09-26 DIAGNOSIS — J42 Unspecified chronic bronchitis: Secondary | ICD-10-CM | POA: Diagnosis not present

## 2019-09-26 DIAGNOSIS — R0902 Hypoxemia: Secondary | ICD-10-CM | POA: Diagnosis not present

## 2019-10-18 ENCOUNTER — Telehealth: Payer: Self-pay

## 2019-10-18 ENCOUNTER — Other Ambulatory Visit: Payer: Self-pay | Admitting: Family Medicine

## 2019-10-18 NOTE — Telephone Encounter (Signed)
Pt is out of pain Meds,

## 2019-10-18 NOTE — Telephone Encounter (Signed)
Patient needs refills on pain medication

## 2019-10-19 ENCOUNTER — Other Ambulatory Visit: Payer: Self-pay | Admitting: Family Medicine

## 2019-10-19 MED ORDER — OXYCODONE-ACETAMINOPHEN 10-325 MG PO TABS: ORAL_TABLET | ORAL | 0 refills | Status: AC

## 2019-10-19 NOTE — Telephone Encounter (Signed)
Medication is sent  to last till next appt Please reschedule  her appt with me to October 13 or after, I will not be in the office on the date she has , and let her know, thanks

## 2019-10-19 NOTE — Telephone Encounter (Signed)
[name] pt DPR  advised of medication sent in and of appt change to 11-28-19 at 1:40

## 2019-10-25 ENCOUNTER — Other Ambulatory Visit: Payer: Self-pay | Admitting: Family Medicine

## 2019-10-27 DIAGNOSIS — J42 Unspecified chronic bronchitis: Secondary | ICD-10-CM | POA: Diagnosis not present

## 2019-10-27 DIAGNOSIS — R0902 Hypoxemia: Secondary | ICD-10-CM | POA: Diagnosis not present

## 2019-10-29 ENCOUNTER — Encounter: Payer: Medicare HMO | Admitting: Family Medicine

## 2019-11-08 ENCOUNTER — Other Ambulatory Visit: Payer: Self-pay | Admitting: Family Medicine

## 2019-11-08 MED ORDER — OXYCODONE-ACETAMINOPHEN 10-325 MG PO TABS: ORAL_TABLET | ORAL | 0 refills | Status: AC

## 2019-11-12 ENCOUNTER — Other Ambulatory Visit: Payer: Self-pay | Admitting: Family Medicine

## 2019-11-12 DIAGNOSIS — D649 Anemia, unspecified: Secondary | ICD-10-CM

## 2019-11-26 DIAGNOSIS — R0902 Hypoxemia: Secondary | ICD-10-CM | POA: Diagnosis not present

## 2019-11-26 DIAGNOSIS — J42 Unspecified chronic bronchitis: Secondary | ICD-10-CM | POA: Diagnosis not present

## 2019-11-28 ENCOUNTER — Encounter: Payer: Self-pay | Admitting: Family Medicine

## 2019-11-28 ENCOUNTER — Ambulatory Visit (INDEPENDENT_AMBULATORY_CARE_PROVIDER_SITE_OTHER): Payer: Medicare HMO | Admitting: Family Medicine

## 2019-11-28 ENCOUNTER — Other Ambulatory Visit: Payer: Self-pay

## 2019-11-28 ENCOUNTER — Other Ambulatory Visit: Payer: Self-pay | Admitting: *Deleted

## 2019-11-28 VITALS — BP 138/84 | HR 100 | Temp 97.6°F | Resp 14 | Ht 67.0 in | Wt 280.0 lb

## 2019-11-28 DIAGNOSIS — Z0001 Encounter for general adult medical examination with abnormal findings: Secondary | ICD-10-CM | POA: Diagnosis not present

## 2019-11-28 DIAGNOSIS — Z23 Encounter for immunization: Secondary | ICD-10-CM

## 2019-11-28 DIAGNOSIS — E1149 Type 2 diabetes mellitus with other diabetic neurological complication: Secondary | ICD-10-CM

## 2019-11-28 DIAGNOSIS — E785 Hyperlipidemia, unspecified: Secondary | ICD-10-CM | POA: Diagnosis not present

## 2019-11-28 DIAGNOSIS — I10 Essential (primary) hypertension: Secondary | ICD-10-CM

## 2019-11-28 DIAGNOSIS — D509 Iron deficiency anemia, unspecified: Secondary | ICD-10-CM | POA: Diagnosis not present

## 2019-11-28 DIAGNOSIS — E559 Vitamin D deficiency, unspecified: Secondary | ICD-10-CM

## 2019-11-28 DIAGNOSIS — G8929 Other chronic pain: Secondary | ICD-10-CM | POA: Diagnosis not present

## 2019-11-28 DIAGNOSIS — Z0000 Encounter for general adult medical examination without abnormal findings: Secondary | ICD-10-CM

## 2019-11-28 MED ORDER — MECLIZINE HCL 25 MG PO TABS: 25.0000 mg | ORAL_TABLET | Freq: Three times a day (TID) | ORAL | 1 refills | Status: DC | PRN

## 2019-11-28 MED ORDER — IMIPRAMINE HCL 50 MG PO TABS: ORAL_TABLET | ORAL | 1 refills | Status: DC

## 2019-11-28 MED ORDER — DONEPEZIL HCL 10 MG PO TABS: 10.0000 mg | ORAL_TABLET | Freq: Every day | ORAL | 3 refills | Status: DC

## 2019-11-28 MED ORDER — ATORVASTATIN CALCIUM 40 MG PO TABS: 40.0000 mg | ORAL_TABLET | Freq: Every day | ORAL | 3 refills | Status: DC

## 2019-11-28 MED ORDER — BUSPIRONE HCL 7.5 MG PO TABS: 7.5000 mg | ORAL_TABLET | Freq: Three times a day (TID) | ORAL | 5 refills | Status: DC

## 2019-11-28 MED ORDER — HYDROXYZINE PAMOATE 25 MG PO CAPS: 25.0000 mg | ORAL_CAPSULE | Freq: Three times a day (TID) | ORAL | 1 refills | Status: DC

## 2019-11-28 MED ORDER — AMLODIPINE BESYLATE 10 MG PO TABS: 10.0000 mg | ORAL_TABLET | Freq: Every day | ORAL | 3 refills | Status: DC

## 2019-11-28 NOTE — Patient Instructions (Addendum)
F/U in office with MD end January, call IF YOU NEED ME BEFORE  Flu vaccine today   New higher dose of lantus tonight 70 units once daily and we will call with lab results  Good foot exam  Vision screen in office today  Please be careful   Not to fall Thanks for choosing [facility], we consider it a privelige to serve you.

## 2019-11-28 NOTE — Progress Notes (Signed)
[name]     MRN: [id number]      DOB: [birth year]  HPI: Patient is in for annual physical exam. Chronic pain management and uncontrolled diabetes are also addressed labs, are drawn on the day of the visit and reviewed as a part of the visit Immunization is reviewed , and  updated   PE: BP 138/84    Pulse (!) 114    Temp 97.6 F (36.4 C)    Resp 14    Ht 5\' 7"  (1.702 m)    Wt 280 lb (127 kg)    BMI 43.85 kg/m    Pleasant  female, alert and oriented x 3, in no cardio-pulmonary distress. Afebrile. HEENT No facial trauma or asymetry. Sinuses non tender.  Extra occullar muscles intact.. External ears normal, . Neck: decreased ROM, no adenopathy,JVD or thyromegaly.No bruits.  Chest: Clear to ascultation bilaterally.No crackles or wheezes. Non tender to palpation  Breast: No asymetry,no masses or lumps. No tenderness. No nipple discharge or inversion. No axillary or supraclavicular adenopathy  Cardiovascular system; Heart sounds normal,  S1 and  S2 ,no S3.  No murmur, or thrill. Apical beat not displaced Peripheral pulses normal.  Abdomen: Soft, non tender, No guarding, tenderness or rebound.      Musculoskeletal exam: Markedly reduced  ROM of spine, hips , shoulders and knees.  deformity ,swelling or crepitus noted.   Neurologic: Cranial nerves 2 to 12 intact. Power, tone ,sensation normal throughout. Marked  disturbance in gait. Relies on a walker.No tremor.  Skin: Intact, no ulceration, erythema , scaling or rash noted.Reports healing ulcer on buttock, unable to visualize at visit Pigmentation normal throughout  Psych; Normal mood and affect.  Assessment & Plan:  Type 2 diabetes mellitus with neurological complications ([place]) [name] is reminded of the importance of commitment to daily physical activity for 30 minutes or more, as able and the need to limit carbohydrate intake to 30 to 60 grams per meal to help with blood sugar control.   The need  to take medication as prescribed, test blood sugar as directed, and to call between visits if there is a concern that blood sugar is uncontrolled is also discussed.   [name] is reminded of the importance of daily foot exam, annual eye examination, and good blood sugar, blood pressure and cholesterol control. Deteriorated, increase lantus to 70 units daily and modify diet, will likley need to send in script for higher dose, family to call in [facility] in 1 week to 10 days  Diabetic Labs Latest Ref Rng & Units 11/28/2019 08/27/2019 05/22/2019 04/24/2019 04/23/2019  HbA1c <5.7 % of total Hgb 9.9(H) 8.5(H) - - -  Microalbumin mg/dL - - - - -  Micro/Creat Ratio <30 mcg/mg creat - - - - -  Chol <200 mg/dL 138 - - - -  HDL > OR = 50 mg/dL 57 - - - -  Calc LDL mg/dL (calc) 56 - - - -  Triglycerides <150 mg/dL 171(H) - - - -  Creatinine 0.60 - 0.88 mg/dL 1.20(H) 1.18(H) 1.09(H) 1.08(H) 1.24(H)   BP/Weight 11/28/2019 09/10/2019 07/18/2019 05/16/2019 04/24/2019 4/0/9811 10/16/4780  Systolic BP 956 213 086 578 469 - 629  Diastolic BP 84 80 80 75 528 - 76  Wt. (Lbs) 280 243 243 235 - 235.23 239  BMI 43.85 38.06 38.06 36.81 - 36.84 38.58   Foot/eye exam completion dates Latest Ref Rng & Units 11/28/2019 07/18/2019  Eye Exam No Retinopathy - -  Foot  exam Order - - -  Foot Form Completion - Done Done        Annual physical exam Annual exam as documented. Counseling done  re healthy lifestyle involving commitment to 150 minutes exercise per week, heart healthy diet, and attaining healthy weight.The importance of adequate sleep also discussed. Regular seat belt use and home safety, is also discussed. Changes in health habits are decided on by the patient with goals and time frames  set for achieving them. Immunization and cancer screening needs are specifically addressed at this visit.   Encounter for chronic pain management The patient's Controlled Substance registry is reviewed and compliance confirmed. Adequacy  of  Pain control and level of function is assessed. Medication dosing is adjusted as deemed appropriate. Twelve weeks of medication is prescribed , with a follow up appointment between 11 to 12 weeks .

## 2019-12-01 ENCOUNTER — Encounter: Payer: Self-pay | Admitting: Family Medicine

## 2019-12-01 LAB — COMPLETE METABOLIC PANEL WITH GFR
AG Ratio: 1.2 (calc) (ref 1.0–2.5)
ALT: 15 U/L (ref 6–29)
AST: 15 U/L (ref 10–35)
Albumin: 4.2 g/dL (ref 3.6–5.1)
Alkaline phosphatase (APISO): 109 U/L (ref 37–153)
BUN/Creatinine Ratio: 12 (calc) (ref 6–22)
BUN: 14 mg/dL (ref 7–25)
CO2: 23 mmol/L (ref 20–32)
Calcium: 9.7 mg/dL (ref 8.6–10.4)
Chloride: 100 mmol/L (ref 98–110)
Creat: 1.2 mg/dL — ABNORMAL HIGH (ref 0.60–0.88)
GFR, Est African American: 49 mL/min/{1.73_m2} — ABNORMAL LOW (ref >=60)
GFR, Est Non African American: 43 mL/min/{1.73_m2} — ABNORMAL LOW (ref >=60)
Globulin: 3.5 g/dL (calc) (ref 1.9–3.7)
Glucose, Bld: 226 mg/dL — ABNORMAL HIGH (ref 65–99)
Potassium: 4.1 mmol/L (ref 3.5–5.3)
Sodium: 136 mmol/L (ref 135–146)
Total Bilirubin: 0.3 mg/dL (ref 0.2–1.2)
Total Protein: 7.7 g/dL (ref 6.1–8.1)

## 2019-12-01 LAB — HEMOGLOBIN A1C
Hgb A1c MFr Bld: 9.9 % of total Hgb — ABNORMAL HIGH (ref <5.7)
Mean Plasma Glucose: 237 (calc)
eAG (mmol/L): 13.2 (calc)

## 2019-12-01 LAB — TEST AUTHORIZATION

## 2019-12-01 LAB — LIPID PANEL
Cholesterol: 138 mg/dL (ref <200)
HDL: 57 mg/dL (ref >=50)
LDL Cholesterol (Calc): 56 mg/dL (calc)
Non-HDL Cholesterol (Calc): 81 mg/dL (calc) (ref <130)
Total CHOL/HDL Ratio: 2.4 (calc) (ref <5.0)
Triglycerides: 171 mg/dL — ABNORMAL HIGH (ref <150)

## 2019-12-01 LAB — TSH: TSH: 1.98 mIU/L (ref 0.40–4.50)

## 2019-12-01 LAB — IRON: Iron: 60 ug/dL (ref 45–160)

## 2019-12-01 LAB — VITAMIN D 25 HYDROXY (VIT D DEFICIENCY, FRACTURES): Vit D, 25-Hydroxy: 27 ng/mL — ABNORMAL LOW (ref 30–100)

## 2019-12-01 LAB — FERRITIN: Ferritin: 98 ng/mL (ref 16–288)

## 2019-12-01 MED ORDER — OXYCODONE-ACETAMINOPHEN 10-325 MG PO TABS: ORAL_TABLET | ORAL | 0 refills | Status: AC

## 2019-12-01 MED ORDER — OXYCODONE-ACETAMINOPHEN 10-325 MG PO TABS
ORAL_TABLET | ORAL | 0 refills | Status: AC
Start: 2019-12-21 — End: 2020-01-20

## 2019-12-01 NOTE — Assessment & Plan Note (Signed)
The patient's Controlled Substance registry is reviewed and compliance confirmed. Adequacy of  Pain control and level of function is assessed. Medication dosing is adjusted as deemed appropriate. Twelve weeks of medication is prescribed , with a follow up appointment between 11 to 12 weeks .  

## 2019-12-01 NOTE — Assessment & Plan Note (Signed)
[name] is reminded of the importance of commitment to daily physical activity for 30 minutes or more, as able and the need to limit carbohydrate intake to 30 to 60 grams per meal to help with blood sugar control.   The need to take medication as prescribed, test blood sugar as directed, and to call between visits if there is a concern that blood sugar is uncontrolled is also discussed.   [name] is reminded of the importance of daily foot exam, annual eye examination, and good blood sugar, blood pressure and cholesterol control. Deteriorated, increase lantus to 70 units daily and modify diet, will likley need to send in script for higher dose, family to call in [facility] in 1 week to 10 days  Diabetic Labs Latest Ref Rng & Units 11/28/2019 08/27/2019 05/22/2019 04/24/2019 04/23/2019  HbA1c <5.7 % of total Hgb 9.9(H) 8.5(H) - - -  Microalbumin mg/dL - - - - -  Micro/Creat Ratio <30 mcg/mg creat - - - - -  Chol <200 mg/dL 138 - - - -  HDL > OR = 50 mg/dL 57 - - - -  Calc LDL mg/dL (calc) 56 - - - -  Triglycerides <150 mg/dL 171(H) - - - -  Creatinine 0.60 - 0.88 mg/dL 1.20(H) 1.18(H) 1.09(H) 1.08(H) 1.24(H)   BP/Weight 11/28/2019 09/10/2019 07/18/2019 05/16/2019 04/24/2019 09/18/4194 03/19/2977  Systolic BP 892 119 417 408 144 - 818  Diastolic BP 84 80 80 75 563 - 76  Wt. (Lbs) 280 243 243 235 - 235.23 239  BMI 43.85 38.06 38.06 36.81 - 36.84 38.58   Foot/eye exam completion dates Latest Ref Rng & Units 11/28/2019 07/18/2019  Eye Exam No Retinopathy - -  Foot exam Order - - -  Foot Form Completion - Done Done

## 2019-12-01 NOTE — Assessment & Plan Note (Signed)

## 2019-12-27 DIAGNOSIS — J42 Unspecified chronic bronchitis: Secondary | ICD-10-CM | POA: Diagnosis not present

## 2019-12-27 DIAGNOSIS — R0902 Hypoxemia: Secondary | ICD-10-CM | POA: Diagnosis not present

## 2020-01-09 ENCOUNTER — Encounter (HOSPITAL_COMMUNITY): Payer: Self-pay

## 2020-01-09 ENCOUNTER — Emergency Department (HOSPITAL_COMMUNITY)
Admission: EM | Admit: 2020-01-09 | Discharge: 2020-01-10 | Disposition: A | Discharge status: Home / Self Care | Payer: Medicare HMO | Attending: Emergency Medicine | Admitting: Emergency Medicine

## 2020-01-09 ENCOUNTER — Emergency Department (HOSPITAL_COMMUNITY): Payer: Medicare HMO

## 2020-01-09 ENCOUNTER — Other Ambulatory Visit: Payer: Self-pay

## 2020-01-09 DIAGNOSIS — W01198A Fall on same level from slipping, tripping and stumbling with subsequent striking against other object, initial encounter: Secondary | ICD-10-CM | POA: Diagnosis not present

## 2020-01-09 DIAGNOSIS — Z7901 Long term (current) use of anticoagulants: Secondary | ICD-10-CM

## 2020-01-09 DIAGNOSIS — M25551 Pain in right hip: Secondary | ICD-10-CM | POA: Diagnosis not present

## 2020-01-09 DIAGNOSIS — W19XXXA Unspecified fall, initial encounter: Secondary | ICD-10-CM | POA: Diagnosis not present

## 2020-01-09 DIAGNOSIS — R519 Headache, unspecified: Secondary | ICD-10-CM | POA: Insufficient documentation

## 2020-01-09 DIAGNOSIS — M25511 Pain in right shoulder: Secondary | ICD-10-CM | POA: Diagnosis not present

## 2020-01-09 DIAGNOSIS — N390 Urinary tract infection, site not specified: Secondary | ICD-10-CM | POA: Insufficient documentation

## 2020-01-09 DIAGNOSIS — Z7984 Long term (current) use of oral hypoglycemic drugs: Secondary | ICD-10-CM | POA: Insufficient documentation

## 2020-01-09 DIAGNOSIS — M25561 Pain in right knee: Secondary | ICD-10-CM | POA: Diagnosis not present

## 2020-01-09 DIAGNOSIS — M25461 Effusion, right knee: Secondary | ICD-10-CM | POA: Diagnosis not present

## 2020-01-09 DIAGNOSIS — N183 Chronic kidney disease, stage 3 unspecified: Secondary | ICD-10-CM | POA: Insufficient documentation

## 2020-01-09 DIAGNOSIS — E1149 Type 2 diabetes mellitus with other diabetic neurological complication: Secondary | ICD-10-CM | POA: Diagnosis present

## 2020-01-09 DIAGNOSIS — Z20822 Contact with and (suspected) exposure to covid-19: Secondary | ICD-10-CM | POA: Insufficient documentation

## 2020-01-09 DIAGNOSIS — I129 Hypertensive chronic kidney disease with stage 1 through stage 4 chronic kidney disease, or unspecified chronic kidney disease: Secondary | ICD-10-CM | POA: Diagnosis not present

## 2020-01-09 DIAGNOSIS — Z96652 Presence of left artificial knee joint: Secondary | ICD-10-CM | POA: Diagnosis not present

## 2020-01-09 DIAGNOSIS — E119 Type 2 diabetes mellitus without complications: Secondary | ICD-10-CM | POA: Insufficient documentation

## 2020-01-09 DIAGNOSIS — Z043 Encounter for examination and observation following other accident: Secondary | ICD-10-CM | POA: Diagnosis not present

## 2020-01-09 DIAGNOSIS — I10 Essential (primary) hypertension: Secondary | ICD-10-CM | POA: Diagnosis not present

## 2020-01-09 DIAGNOSIS — R609 Edema, unspecified: Secondary | ICD-10-CM | POA: Diagnosis not present

## 2020-01-09 DIAGNOSIS — S8991XA Unspecified injury of right lower leg, initial encounter: Secondary | ICD-10-CM | POA: Diagnosis not present

## 2020-01-09 DIAGNOSIS — F0390 Unspecified dementia without behavioral disturbance: Secondary | ICD-10-CM | POA: Diagnosis not present

## 2020-01-09 DIAGNOSIS — M159 Polyosteoarthritis, unspecified: Secondary | ICD-10-CM | POA: Diagnosis present

## 2020-01-09 DIAGNOSIS — M79604 Pain in right leg: Secondary | ICD-10-CM | POA: Diagnosis not present

## 2020-01-09 DIAGNOSIS — S0990XA Unspecified injury of head, initial encounter: Secondary | ICD-10-CM | POA: Diagnosis not present

## 2020-01-09 LAB — URINALYSIS, COMPLETE (UACMP) WITH MICROSCOPIC
Bilirubin Urine: NEGATIVE
Glucose, UA: NEGATIVE mg/dL
Ketones, ur: 5 mg/dL — AB
Nitrite: NEGATIVE
Protein, ur: >=300 mg/dL — AB
Specific Gravity, Urine: 1.016 (ref 1.005–1.030)
WBC, UA: >50 WBC/hpf — ABNORMAL HIGH (ref 0–5)
pH: 6 (ref 5.0–8.0)

## 2020-01-09 LAB — CBC WITH DIFFERENTIAL/PLATELET
Abs Immature Granulocytes: 0.03 10*3/uL (ref 0.00–0.07)
Basophils Absolute: 0 10*3/uL (ref 0.0–0.1)
Basophils Relative: 0 %
Eosinophils Absolute: 0 10*3/uL (ref 0.0–0.5)
Eosinophils Relative: 0 %
HCT: 42.1 % (ref 36.0–46.0)
Hemoglobin: 13.2 g/dL (ref 12.0–15.0)
Immature Granulocytes: 0 %
Lymphocytes Relative: 19 %
Lymphs Abs: 1.9 10*3/uL (ref 0.7–4.0)
MCH: 28 pg (ref 26.0–34.0)
MCHC: 31.4 g/dL (ref 30.0–36.0)
MCV: 89.4 fL (ref 80.0–100.0)
Monocytes Absolute: 0.6 10*3/uL (ref 0.1–1.0)
Monocytes Relative: 6 %
Neutro Abs: 7.3 10*3/uL (ref 1.7–7.7)
Neutrophils Relative %: 75 %
Platelets: 312 10*3/uL (ref 150–400)
RBC: 4.71 MIL/uL (ref 3.87–5.11)
RDW: 13.5 % (ref 11.5–15.5)
WBC: 9.8 10*3/uL (ref 4.0–10.5)
nRBC: 0 % (ref 0.0–0.2)

## 2020-01-09 LAB — RESP PANEL BY RT-PCR (FLU A&B, COVID) ARPGX2
Influenza A by PCR: NEGATIVE
Influenza B by PCR: NEGATIVE
SARS Coronavirus 2 by RT PCR: NEGATIVE

## 2020-01-09 LAB — COMPREHENSIVE METABOLIC PANEL
ALT: 22 U/L (ref 0–44)
AST: 20 U/L (ref 15–41)
Albumin: 3.8 g/dL (ref 3.5–5.0)
Alkaline Phosphatase: 111 U/L (ref 38–126)
Anion gap: 10 (ref 5–15)
BUN: 14 mg/dL (ref 8–23)
CO2: 25 mmol/L (ref 22–32)
Calcium: 9.2 mg/dL (ref 8.9–10.3)
Chloride: 100 mmol/L (ref 98–111)
Creatinine, Ser: 1.26 mg/dL — ABNORMAL HIGH (ref 0.44–1.00)
GFR, Estimated: 43 mL/min — ABNORMAL LOW (ref >60)
Glucose, Bld: 225 mg/dL — ABNORMAL HIGH (ref 70–99)
Potassium: 3.7 mmol/L (ref 3.5–5.1)
Sodium: 135 mmol/L (ref 135–145)
Total Bilirubin: 0.3 mg/dL (ref 0.3–1.2)
Total Protein: 7.8 g/dL (ref 6.5–8.1)

## 2020-01-09 MED ORDER — MORPHINE SULFATE (PF) 4 MG/ML IV SOLN
4.0000 mg | Freq: Once | INTRAVENOUS | Status: AC
  Administered 2020-01-09: 4 mg via INTRAVENOUS
  Filled 2020-01-09: qty 1

## 2020-01-09 MED ORDER — ONDANSETRON HCL 4 MG/2ML IJ SOLN
4.0000 mg | Freq: Once | INTRAMUSCULAR | Status: AC
  Administered 2020-01-09: 4 mg via INTRAVENOUS
  Filled 2020-01-09: qty 2

## 2020-01-09 MED ORDER — CEPHALEXIN 500 MG PO CAPS: 500.0000 mg | ORAL_CAPSULE | Freq: Four times a day (QID) | ORAL | 0 refills | Status: DC

## 2020-01-09 MED ORDER — SODIUM CHLORIDE 0.9 % IV SOLN
1.0000 g | Freq: Once | INTRAVENOUS | Status: AC
  Administered 2020-01-09: 1 g via INTRAVENOUS
  Filled 2020-01-09: qty 10

## 2020-01-09 MED ORDER — ACETAMINOPHEN 500 MG PO TABS
1000.0000 mg | ORAL_TABLET | Freq: Once | ORAL | Status: AC
  Administered 2020-01-09: 1000 mg via ORAL
  Filled 2020-01-09: qty 2

## 2020-01-09 NOTE — ED Provider Notes (Signed)
[facility] EMERGENCY DEPARTMENT Provider Note   CSN: [id number] Arrival date & time: 01/09/20  1424     History Chief Complaint  Patient presents with  . Fall  . Leg Pain  . Altered Mental Status    EMSLEE [name] is a 79 y.o. female with a history as outlined below, significant for atrial fibrillation and PE on Coumadin, dementia who lives at home with her daughter, diabetes, hypertension presenting for evaluation of increased confusion per family's report, fell just prior to arrival and endorses pain in her right hip shoulder and knee regions.  She states that she was trying to sit on the toilet when she fell and landed on her right side causing injury.  She denies LOC and denies hitting her head with this fall but endorses headache since the event.  She was fairly hypertensive upon arrival of EMS reporting her blood pressure 200/111.  HPI     Past Medical History:  Diagnosis Date  . Anemia, iron deficiency 2012   Evaluated by Dr. Oneida Alar; H&H of 9.3/30.8 with [place] in 10/2010; 3/3 positive Hemoccult cards in 07/2011  . Anxiety    was on Prozac for a couple of weeks  . Atrial fibrillation ([place])    takes Coumadin daily  . Bruises easily    takes Coumadin  . Chronic anticoagulation 07/21/2010  . Chronic back pain    related to knee pain  . Degenerative joint disease    Knees  . Dementia ([place])   . Depression   . Diabetes mellitus    takes Metformin daily  . Gastroesophageal reflux disease    takes Omeprazole daily  . Glaucoma   . Glaucoma    uses eye drops at night  . History of blood transfusion 1969  . History of gout    was on medication but taken off of several months ago  . Hx of colonic polyps   . Hx of migraines    last one about a yr ago;takes Topamax daily  . Hyperlipidemia    takes Pravastatin daily  . Hypertension    takes Hyzaar daily and Propranolol   . Insomnia    takes Restoril prn  . Joint pain   . Joint swelling   . Memory loss    takes  donepezil  . Migraine   . Migraine   . Nocturia   . Overweight(278.02)   . Pneumonia 1969   hx of  . Pulmonary embolism [facility]) May 2012   acute presentation, bilateral PE  . Skin spots-aging   . Urinary frequency     Patient Active Problem List   Diagnosis Date Noted  . Falls, initial encounter 01/09/2020  . Anemia 05/16/2019  . CKD stage G3b/A1, 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) 04/20/2019  . Esophageal dysphagia 01/26/2019  . Chest pain 11/10/2018  . 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 History of pulmonary embolism 01/25/2018  . Encounter for chronic pain management 07/17/2016  . Chronic pain syndrome 02/03/2016  . Annual physical exam 05/29/2015  . Chronic bronchitis ([place]) 10/31/2014  . Nausea 09/03/2014  . Bilateral knee pain 04/23/2014  . Type 2 diabetes mellitus with neurological complications ([place]) 38/45/3646  . Right-sided low back pain with right-sided sciatica 07/02/2013  . Seasonal allergies 04/11/2013  . Urinary incontinence 04/11/2013  . Metabolic syndrome X 80/32/1224  . Chronic anticoagulation 12/22/2012  . Vitamin D deficiency 11/05/2012  . Memory loss 11/01/2012  . Depression with anxiety 11/01/2012  . Anemia, iron  deficiency   . Morbid obesity ([place]) 08/16/2008  . Globus pharyngeus 01/26/2008  . Dyslipidemia 05/30/2007  . Migraine 05/30/2007  . Essential hypertension 05/30/2007  . Gastroesophageal reflux disease 05/30/2007    Past Surgical History:  Procedure Laterality Date  . BIOPSY  04/22/2019   Procedure: BIOPSY;  Surgeon: Daneil Dolin, MD;  Location: [facility] ENDO [facility];  Service: Endoscopy;;  duodenum  . CATARACT EXTRACTION W/PHACO Right 04/11/2012   Procedure: CATARACT EXTRACTION PHACO AND INTRAOCULAR LENS PLACEMENT (IOC);  Surgeon: Elta Guadeloupe T. Gershon Crane, MD;  Location: [facility] ORS;  Service: Ophthalmology;  Laterality: Right;  CDE=9.61  . CATARACT EXTRACTION W/PHACO Left 12/26/2012   Procedure: CATARACT EXTRACTION PHACO AND  INTRAOCULAR LENS PLACEMENT (IOC);  Surgeon: Elta Guadeloupe T. Gershon Crane, MD;  Location: [facility] ORS;  Service: Ophthalmology;  Laterality: Left;  CDE:7.74  . COLONOSCOPY  Jan 2002; 2012   2002: Dr. Tamala Julian, ext. hemorrhoids, nl colon; 2012-adenomatous polyps, gastritis on EGD  . COLONOSCOPY N/A 04/24/2019   Procedure: COLONOSCOPY;  Surgeon: Rogene Houston, MD;  Location: [facility];  Service: Endoscopy;  Laterality: N/A;  . CYSTOSCOPY W/ URETERAL STENT PLACEMENT Bilateral 02/25/2018   Procedure: CYSTOSCOPY WITH BILATERAL RETROGRADE PYELOGRAM/BILATERAL URETERAL STENT PLACEMENT, BLADDER BIOPSIES WITH FULGERATION;  Surgeon: [name], MD;  Location: [facility] ORS;  Service: Urology;  Laterality: Bilateral;  . CYSTOSCOPY/URETEROSCOPY/HOLMIUM LASER/STENT PLACEMENT Bilateral 03/21/2018   Procedure: CYSTOSCOPY/URETEROSCOPY/HOLMIUM LASER/STENT PLACEMENT;  Surgeon: [name], MD;  Location: [facility];  Service: Urology;  Laterality: Bilateral;  ONLY NEEDS 60 MIN  . DILATION AND CURETTAGE OF UTERUS    . ESOPHAGOGASTRODUODENOSCOPY  08/24/2011   ESOPHAGOGASTRODUODENOSCOPY; esophageal dilatation; Rogene Houston, MD;  . ESOPHAGOGASTRODUODENOSCOPY N/A 04/22/2019   Procedure: ESOPHAGOGASTRODUODENOSCOPY (EGD);  Surgeon: Daneil Dolin, MD;  [facility];  Service: Endoscopy;  Laterality: N/A;  . GIVENS CAPSULE STUDY  08/18/2011   Procedure: GIVENS CAPSULE STUDY;  Surgeon: Rogene Houston, MD;  Location: [facility];  Service: Endoscopy;  Laterality: N/A;  730  . KNEE ARTHROSCOPY W/ MENISCECTOMY  90's   Left  . MALONEY DILATION  04/22/2019   Procedure: MALONEY DILATION;  Surgeon: Daneil Dolin, MD;  [facility];  Service: Endoscopy;;  . ORIF ANKLE FRACTURE Right 90's  . TONSILLECTOMY    . TOTAL KNEE ARTHROPLASTY  10/20/2011   Procedure: TOTAL KNEE ARTHROPLASTY;  Surgeon: Ninetta Lights, MD;  Location: [facility];  Service: Orthopedics;  Laterality: Left;  . UPPER GASTROINTESTINAL  ENDOSCOPY    . URETHRAL DILATION       OB History   No obstetric history on file.     Family History  Problem Relation Age of Onset  . Diabetes Mother   . Hypertension Mother   . Arthritis Mother   . Heart failure Mother   . Migraines Mother   . Kidney failure Mother   . Leukemia Father   . Migraines Father   . Kidney failure Father   . Diabetes Sister   . Hypertension Sister   . Diabetes Brother   . Hypertension Brother   . Hypertension Brother   . Hypertension Brother   . Hypertension Brother   . Diabetes Brother   . Diabetes Brother   . Diabetes Brother   . Pulmonary embolism Sister   . Migraines Sister   . Kidney failure Brother   . Colon cancer Neg Hx   . Colon polyps Neg Hx     Social History   Tobacco Use  . Smoking  status: Never Smoker  . Smokeless tobacco: Never Used  Vaping Use  . Vaping Use: Never used  Substance Use Topics  . Alcohol use: No  . Drug use: No    Home Medications Prior to Admission medications   Medication Sig Start Date End Date Taking? Authorizing Provider  acetaminophen (TYLENOL) 500 MG tablet Take one tablet two times daily for chronic pain Patient taking differently: Take 500 mg by mouth in the morning and at bedtime. for chronic pain 04/19/19   [name], MD  albuterol (VENTOLIN HFA) 108 (90 Base) MCG/ACT inhaler Inhale 2 puffs into the lungs every 6 (six) hours as needed for wheezing or shortness of breath. 02/27/18   Emokpae, Courage, MD  amLODipine (NORVASC) 10 MG tablet TAKE 1 TABLET BY MOUTH ONCE DAILY. 07/17/19   [name], MD  amLODipine (NORVASC) 10 MG tablet Take 1 tablet (10 mg total) by mouth daily. 11/28/19   [name], MD  atorvastatin (LIPITOR) 40 MG tablet Take 1 tablet (40 mg total) by mouth daily. 11/28/19   [name], MD  busPIRone (BUSPAR) 7.5 MG tablet Take 1 tablet (7.5 mg total) by mouth 3 (three) times daily. 11/28/19   [name], MD  cephALEXin (KEFLEX) 500  MG capsule Take 1 capsule (500 mg total) by mouth 4 (four) times daily. 01/09/20   Evalee Jefferson, PA-C  cloNIDine (CATAPRES) 0.2 MG tablet TAKE ONE TABLET BY MOUTH AT BEDTIME. 07/27/19   [name], MD  clotrimazole-betamethasone (LOTRISONE) cream Apply 1 application topically 2 (two) times daily. D/c vusion ointment 04/19/19   [name], MD  COMBIGAN 0.2-0.5 % ophthalmic solution Place 1 drop into both eyes 2 (two) times daily. 03/23/17   [provider]  conjugated estrogens (PREMARIN) vaginal cream Place vaginally 2 (two) times a week. Apply sparingly ( fingertip)  Twice weekly to vaginal area on Wednesdays and Fridays Patient not taking: Reported on 11/28/2019 01/30/18   Murlean Iba, MD  dabigatran (PRADAXA) 150 MG CAPS capsule Take 1 capsule (150 mg total) by mouth 2 (two) times daily. 04/25/19   Johnson, Clanford L, MD  docusate sodium (COLACE) 100 MG capsule Take 1 capsule (100 mg total) by mouth daily as needed for mild constipation. Patient taking differently: Take 100 mg by mouth daily.  03/20/18   Hosie Poisson, MD  donepezil (ARICEPT) 10 MG tablet Take 1 tablet (10 mg total) by mouth at bedtime. 11/28/19   [name], MD  Fluoxetine HCl, PMDD, 10 MG TABS Take 1 tablet (10 mg total) by mouth daily. 01/27/18   Johnson, Clanford L, MD  glucose blood (ACCU-CHEK AVIVA PLUS) test strip USE TO TEST BLOOD SUGAR TWICE DAILY. 08/27/19   [name], MD  GNP IRON 142 (45 Fe) MG TBCR Take 1 tablet by mouth with breakfast, with lunch, and with evening meal. 11/13/19   [name], MD  hydrOXYzine (VISTARIL) 25 MG capsule Take 1 capsule (25 mg total) by mouth 3 (three) times daily. 11/28/19   [name], MD  imipramine (TOFRANIL) 50 MG tablet Take two tablets at bedtime 11/28/19   [name], MD  insulin glargine (LANTUS) 100 unit/mL SOPN Inject 70 Units into the skin daily. 12/01/19   [name], MD  Insulin Syringe-Needle  U-100 32G X 5/16" 1 ML MISC 1 each by Does not apply route in the morning and at bedtime. Use to inject insulin twice daily dx e11.65 05/16/19  Perlie Mayo, NP  meclizine (ANTIVERT) 25 MG tablet Take 1 tablet (25 mg total) by mouth 3 (three) times daily as needed for dizziness. 11/28/19   [name], MD  montelukast (SINGULAIR) 10 MG tablet TAKE ONE TABLET BY MOUTH AT BEDTIME. 08/27/19   [name], MD  nystatin (MYCOSTATIN/NYSTOP) powder Apply topically daily as needed (for rash under breasts). 12/04/18   [name], MD  olopatadine (PATANOL) 0.1 % ophthalmic solution Place 1 drop into both eyes 2 (two) times daily. 01/27/18   Johnson, Clanford L, MD  omeprazole (PRILOSEC) 40 MG capsule TAKE (1) CAPSULE BY MOUTH TWICE DAILY. 07/27/19   [name], MD  oxyCODONE-acetaminophen [facility]) 10-325 MG tablet Take one tablet by mouth two times daily 12/21/19 01/20/20  [name], MD  oxyCODONE-acetaminophen [facility]) 10-325 MG tablet Take one tablet by mouth two times daily for pain 01/20/20 02/19/20  [name], MD  oxyCODONE-acetaminophen (PERCOCET) 10-325 MG tablet Take one tablet by mouth two times daily for pain 02/19/20 03/20/20  [name], MD  SUMAtriptan (IMITREX) 25 MG tablet TAKE 1 TABLET BY MOUTH AS NEEDED FOR MIGRAINE. MAY REPEAT IN 2 HOURS IF HEADACHE PERSISTS OR RECURS. 07/19/19   [name], MD  topiramate (TOPAMAX) 100 MG tablet TAKE 1 TABLET BY MOUTH TWICE DAILY 08/27/19   [name], MD    Allergies    Penicillins, Fentanyl, Sulfonamide derivatives, and Codeine  Review of Systems   Review of Systems  Constitutional: Negative.   HENT: Negative.   Respiratory: Negative.   Cardiovascular: Negative.   Gastrointestinal: Negative.   Musculoskeletal: Positive for arthralgias. Negative for joint swelling.  Neurological: Positive for headaches.  Psychiatric/Behavioral: Positive for confusion.  All other systems  reviewed and are negative.   Physical Exam Updated Vital Signs BP (!) 157/145   Pulse 100   Temp 98.7 F (37.1 C) (Oral)   Resp 20   Ht 5' 7"  (1.702 m)   Wt 117.9 kg   SpO2 100%   BMI 40.72 kg/m   Physical Exam Vitals and nursing note reviewed.  Constitutional:      Appearance: She is well-developed.     Comments: Oriented x 2, not oriented to time, but new tomorrow is Thanksgiving, then was able to guess November.   HENT:     Head: Normocephalic and atraumatic.  Eyes:     Conjunctiva/sclera: Conjunctivae normal.  Cardiovascular:     Rate and Rhythm: Normal rate and regular rhythm.     Heart sounds: Normal heart sounds.  Pulmonary:     Effort: Pulmonary effort is normal.     Breath sounds: Normal breath sounds. No wheezing.  Abdominal:     General: Bowel sounds are normal.     Palpations: Abdomen is soft.     Tenderness: There is no abdominal tenderness. There is no guarding.  Musculoskeletal:        General: Tenderness present. Normal range of motion.     Right shoulder: Tenderness present. No swelling or deformity.     Cervical back: Normal range of motion.     Comments: ttp right anterior shoulder, right medial knee and right lateral hip. Right foot slightly shortened and held in external rotation.   Skin:    General: Skin is warm and dry.     Findings: No bruising or erythema.     Comments: Well healed surgical incisions left knee, bilateral 1st metatarsals.  Neurological:     Mental Status:  She is alert.     ED Results / Procedures / Treatments   Labs (all labs ordered are listed, but only abnormal results are displayed) Labs Reviewed  COMPREHENSIVE METABOLIC PANEL - Abnormal; Notable for the following components:      Result Value   Glucose, Bld 225 (*)    Creatinine, Ser 1.26 (*)    GFR, Estimated 43 (*)    All other components within normal limits  URINALYSIS, COMPLETE (UACMP) WITH MICROSCOPIC - Abnormal; Notable for the following components:    APPearance CLOUDY (*)    Hgb urine dipstick SMALL (*)    Ketones, ur 5 (*)    Protein, ur >=300 (*)    Leukocytes,Ua SMALL (*)    WBC, UA >50 (*)    Bacteria, UA MANY (*)    All other components within normal limits  RESP PANEL BY RT-PCR (FLU A&B, COVID) [id number]  URINE CULTURE  CBC WITH DIFFERENTIAL/PLATELET  CBG MONITORING, ED    EKG None  Radiology DG Shoulder Right  Result Date: 01/09/2020 CLINICAL DATA:  Status post fall. EXAM: RIGHT SHOULDER - 2+ VIEW COMPARISON:  None. FINDINGS: There is no evidence of an acute fracture or dislocation. Mild to moderate severity degenerative changes seen involving the right acromioclavicular joint and right glenohumeral articulation. Soft tissues are unremarkable. IMPRESSION: 1. Mild to moderate severity degenerative changes. Electronically Signed   By: Virgina Norfolk M.D.   On: 01/09/2020 16:32   CT HEAD WO CONTRAST  Result Date: 01/09/2020 CLINICAL DATA:  Head trauma.  Fall. EXAM: CT HEAD WITHOUT CONTRAST TECHNIQUE: Contiguous axial images were obtained from the base of the skull through the vertex without intravenous contrast. COMPARISON:  CT head April 14, 2017 FINDINGS: Brain: No evidence of acute infarction, hemorrhage, hydrocephalus, extra-axial collection or mass lesion/mass effect. Patchy white matter hypoattenuation, compatible with chronic microvascular ischemic disease. Vascular: Calcific atherosclerosis. Skull: No acute fracture. Sinuses/Orbits: No acute finding. Other: No mastoid effusions. IMPRESSION: No evidence of acute intracranial abnormality. Electronically Signed   By: Margaretha Sheffield MD   On: 01/09/2020 15:50   DG Knee Complete 4 Views Right  Result Date: 01/09/2020 CLINICAL DATA:  Status post fall. EXAM: RIGHT KNEE - COMPLETE 4+ VIEW COMPARISON:  None. FINDINGS: No evidence of an acute fracture or dislocation. There is marked severity narrowing of the medial and lateral tibiofemoral compartment spaces. Marked severity  patellofemoral narrowing is also seen. A small joint effusion is noted. IMPRESSION: 1. Severe tricompartmental degenerative changes with a small joint effusion. Electronically Signed   By: Virgina Norfolk M.D.   On: 01/09/2020 16:31   DG HIPS BILAT WITH PELVIS MIN 5 VIEWS  Result Date: 01/09/2020 CLINICAL DATA:  Status post fall. EXAM: DG HIP (WITH OR WITHOUT PELVIS) 5+V BILAT COMPARISON:  None. FINDINGS: There is no evidence of hip fracture or dislocation. Mild degenerative changes seen in the form of joint space narrowing and acetabular sclerosis. IMPRESSION: No acute osseous injury. Electronically Signed   By: Virgina Norfolk M.D.   On: 01/09/2020 16:36    Procedures Procedures (including critical care time)  Medications Ordered in ED Medications  acetaminophen (TYLENOL) tablet 1,000 mg (1,000 mg Oral Given 01/09/20 1620)  morphine 4 MG/ML injection 4 mg (4 mg Intravenous Given 01/09/20 1620)  ondansetron (ZOFRAN) injection 4 mg (4 mg Intravenous Given 01/09/20 1620)  cefTRIAXone (ROCEPHIN) 1 g in sodium chloride 0.9 % 100 mL IVPB (0 g Intravenous Stopped 01/09/20 1809)    ED Course  I have reviewed  the triage vital signs and the nursing notes.  Pertinent labs & imaging results that were available during my care of the patient were reviewed by me and considered in my medical decision making (see chart for details).  Clinical Course as of Jan 09 1851  Wed Jan 09, 2020  1813 Chronically debilitated 79 year old female here for evaluation of injuries after reported fall.  Pressure is elevated.  Has improved with time here.  Labs showing possible UTI.  Imaging did not show any acute fractures.  Will review with hospitalist for possible admission.   [MB]    Clinical Course User Index [MB] Hayden Rasmussen, MD   MDM Rules/Calculators/A&P                          Imaging and labs reviewed and discussed with patient and her son at the bedside.  She does not have any acute fractures  from today's fall.  She does have dementia at baseline but she is more confused than her normal per son at the bedside and her daughter via phone who is her primary caregiver.  Her daughter is actively receiving treatment for cancer and with her increased confusion, dg concerned about caring for her at home.  She was given her first dose of Rocephin here.    Discussed with Dr. Denton Brick who consulted pt but did not feel pt met criteria for admission.  This was discussed with son at bedside and dg at home who agrees with plan.  Keflex bid, return precautions outlined.  Final Clinical Impression(s) / ED Diagnoses Final diagnoses:  Lower urinary tract infectious disease  Fall, initial encounter    Rx / DC Orders ED Discharge Orders         Ordered    cephALEXin (KEFLEX) 500 MG capsule  4 times daily        01/09/20 1850           Evalee Jefferson, Hershal Coria 01/09/20 1855    Hayden Rasmussen, MD 01/10/20 1007

## 2020-01-09 NOTE — Consult Note (Signed)
Patient Demographics:    [name], is a 79 y.o. female  MRN: [id number]   DOB - July 30, 1940  Admit Date - 01/09/2020  Outpatient Primary MD for the patient is [name], MD   Assessment & Plan:    Principal Problem:   Falls, initial encounter Active Problems:   Essential hypertension   Morbid obesity ([place])   Chronic anticoagulation   Type 2 diabetes mellitus with neurological complications (HCC)   Generalized osteoarthritis    1) ambulatory dysfunction and fall at home----extensive work-up including labs and imaging studies including CBC, CMP, knee, hip and shoulder x-rays as well as CT head without contrast without acute findings --Denies loss of consciousness, denies head injury --no fevers no chills no palpitations no vomiting or diarrhea no chest pains no shortness of breath no headaches no dizziness -from talking to patient's son at bedside and patient's daughter (Primary caregiver )who was on speaker phone--patient was in his usual state of health yesterday and today, typically she spends about 23 hours each day in the lift chair--usually reluctant to get into bed or to get up--she uses depends and pull-up diapers to urinate, had daughter changes several times a day, patient usually gets up once or twice a day with a walker and with assistance to have a bowel movement--- otherwise she stays in the lift chair all day and all night-- -patient continues to refuse home health PT and home health services --Patient appears to be at baseline (sedentary and really nonambulatory) --Okay to discharge home with family  2)Possible UTI-- Patient's UA may be contaminated but may also be infected--EDP gave IV Rocephin and EDP will discharged on oral antibiotics pending culture results-  -No fevers, no dysuria and  CBC WNL, no flank pain no vomiting  3) chronic anticoagulation the patient with history of PE and chronic atrial fibrillation--- continue Pradaxa.  CBC WNL  4)Stage IIIb CKD-currently stable, renal parameters are at patient's baseline  5)DM2-recent A1c 9.9 reflecting  uncontrolled with hyperglycemia PTA, continue home regimen follow-up with PCP for further adjustments   6)Chronic pain-secondary to back pain and diffuse osteoarthritis controlled with home pain medications as needed-follow-up with PCP for adjustments of pain medications  7)Dementia and memory loss-some cognitive and memory deficits but overall according to patient's son and daughter Izora Gala appears to be at her baseline --No superimposed encephalopathy or delirium at this time  8)HTN--BP is not at goal, patient had a fall, she did have hip and shoulder pain which may have exacerbated her elevated BP  -No acute neuro concerns, no acute cardiac  Concerns -Follow-up with PCP for further adjustments of BP medications  Social/family communications--- -Work-up/findings, plan of care discussed with patient's son Rita Ohara who is physically at bedside, pt's daughter Ms. Elberta Leatherwood who was on the speaker phone and patient's sister Nicholes Stairs who called actually  on the room phone in the ED Room # 6--- questions answered  Disposition--- discussed extensively  with EDP, further disposition by EDP, recommend discharge on oral antibiotics pending urine culture data  With History of - Reviewed by me  Past Medical History:  Diagnosis Date  . Anemia, iron deficiency 2012   Evaluated by Dr. Oneida Alar; H&H of 9.3/30.8 with [place] in 10/2010; 3/3 positive Hemoccult cards in 07/2011  . Anxiety    was on Prozac for a couple of weeks  . Atrial fibrillation ([place])    takes Coumadin daily  . Bruises easily    takes Coumadin  . Chronic anticoagulation 07/21/2010  . Chronic back pain    related to knee pain  . Degenerative joint disease     Knees  . Dementia ([place])   . Depression   . Diabetes mellitus    takes Metformin daily  . Gastroesophageal reflux disease    takes Omeprazole daily  . Glaucoma   . Glaucoma    uses eye drops at night  . History of blood transfusion 1969  . History of gout    was on medication but taken off of several months ago  . Hx of colonic polyps   . Hx of migraines    last one about a yr ago;takes Topamax daily  . Hyperlipidemia    takes Pravastatin daily  . Hypertension    takes Hyzaar daily and Propranolol   . Insomnia    takes Restoril prn  . Joint pain   . Joint swelling   . Memory loss    takes donepezil  . Migraine   . Migraine   . Nocturia   . Overweight(278.02)   . Pneumonia 1969   hx of  . Pulmonary embolism [facility]) May 2012   acute presentation, bilateral PE  . Skin spots-aging   . Urinary frequency       Past Surgical History:  Procedure Laterality Date  . BIOPSY  04/22/2019   Procedure: BIOPSY;  Surgeon: Daneil Dolin, MD;  [facility];  Service: Endoscopy;;  duodenum  . CATARACT EXTRACTION W/PHACO Right 04/11/2012   Procedure: CATARACT EXTRACTION PHACO AND INTRAOCULAR LENS PLACEMENT (IOC);  Surgeon: Elta Guadeloupe T. Gershon Crane, MD;  Location: [facility];  Service: Ophthalmology;  Laterality: Right;  CDE=9.61  . CATARACT EXTRACTION W/PHACO Left 12/26/2012   Procedure: CATARACT EXTRACTION PHACO AND INTRAOCULAR LENS PLACEMENT (IOC);  Surgeon: Elta Guadeloupe T. Gershon Crane, MD;  Location: [facility];  Service: Ophthalmology;  Laterality: Left;  CDE:7.74  . COLONOSCOPY  Jan 2002; 2012   2002: Dr. Tamala Julian, ext. hemorrhoids, nl colon; 2012-adenomatous polyps, gastritis on EGD  . COLONOSCOPY N/A 04/24/2019   Procedure: COLONOSCOPY;  Surgeon: Rogene Houston, MD;  [facility];  Service: Endoscopy;  Laterality: N/A;  . CYSTOSCOPY W/ URETERAL STENT PLACEMENT Bilateral 02/25/2018   Procedure: CYSTOSCOPY WITH BILATERAL RETROGRADE PYELOGRAM/BILATERAL URETERAL STENT PLACEMENT, BLADDER BIOPSIES  WITH FULGERATION;  Surgeon: [name], MD;  Location: [facility];  Service: Urology;  Laterality: Bilateral;  . CYSTOSCOPY/URETEROSCOPY/HOLMIUM LASER/STENT PLACEMENT Bilateral 03/21/2018   Procedure: CYSTOSCOPY/URETEROSCOPY/HOLMIUM LASER/STENT PLACEMENT;  Surgeon: [name], MD;  Location: [facility];  Service: Urology;  Laterality: Bilateral;  ONLY NEEDS 60 MIN  . DILATION AND CURETTAGE OF UTERUS    . ESOPHAGOGASTRODUODENOSCOPY  08/24/2011   ESOPHAGOGASTRODUODENOSCOPY; esophageal dilatation; Rogene Houston, MD;  . ESOPHAGOGASTRODUODENOSCOPY N/A 04/22/2019   Procedure: ESOPHAGOGASTRODUODENOSCOPY (EGD);  Surgeon: Daneil Dolin, MD;  [facility];  Service: Endoscopy;  Laterality: N/A;  . GIVENS CAPSULE STUDY  08/18/2011   Procedure: GIVENS CAPSULE STUDY;  Surgeon: Rogene Houston, MD;  [facility];  Service: Endoscopy;  Laterality: N/A;  730  . KNEE ARTHROSCOPY W/ MENISCECTOMY  90's   Left  . MALONEY DILATION  04/22/2019   Procedure: MALONEY DILATION;  Surgeon: Daneil Dolin, MD;  [facility];  Service: Endoscopy;;  . ORIF ANKLE FRACTURE Right 90's  . TONSILLECTOMY    . TOTAL KNEE ARTHROPLASTY  10/20/2011   Procedure: TOTAL KNEE ARTHROPLASTY;  Surgeon: Ninetta Lights, MD;  Location: [place];  Service: Orthopedics;  Laterality: Left;  . UPPER GASTROINTESTINAL ENDOSCOPY    . URETHRAL DILATION      Chief Complaint  Patient presents with  . Fall  . Leg Pain  . Altered Mental Status      HPI:    [name]  is a 79 y.o. 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) who presents to the ED after  falling at home -- Had a long three-way conversation with patient's son [name] who was at bedside in the ED and patient started Blythe Stanford who is patient's primary caregiver who is currently at home due to being immunocompromised and undergoing chemotherapy for malignancy--- from talking to patient's son at bedside and patient's daughter (Primary caregiver )who was on speaker phone--patient was in his usual state of health yesterday and today, typically she spends about 23 hours each day in the lift chair--usually reluctant to get into bed or to get up--she uses depends and pull-up diapers to urinate, had daughter changes several times a day, patient usually gets up once or twice a day with a walker and with assistance to have a bowel movement--- otherwise she stays in the lift chair all day and all night--- today patient's daughter stepped out of the store--- patient needed to have a bowel movement despite being told to wait until daughter got back from the store--- patient attempted to get up by herself to use a bedside commode for bowel movement--she fell -Denies loss of consciousness, denies head injury -She did complain of right hip pain, right shoulder pain and right knee pain -No headaches no chest pains no palpitations no dizziness -She was asymptomatic prior to falling.  -Patient has mild dementia and is somewhat forgetful, in talking to patient son at bedside and patient's daughter who is on the phone cognitively patient appears to be at baseline at this time - --In the ED she did conversational unable to actually give a good story, history verified by her son at bedside and her daughter who is on the speaker phone-- -no fevers no chills no palpitations no vomiting or diarrhea no chest pains no shortness of breath no headaches no dizziness - In ED extensive work-up without any significant acute findings -X-rays of the right knee and hips as well as the right shoulder with arthritis but no  new acute findings -CT head without contrast without acute findings -CBC is WNL -Remarkable for sugar of 225 and a creatinine of 1.26 which is close to patient's baseline -Patient's UA may be contaminated but may also be infected--EDP gave IV Rocephin and EDP was discharged on oral antibiotics pending culture results-- --Covid and influenza testing is  negative  -Work-up/findings, plan of care discussed with patient's son Rita Ohara who is physically at bedside, pt's daughter Ms. Elberta Leatherwood who was on the speaker phone and patient's sister Nicholes Stairs who called actually  on the room phone in the ED Room # 6--- questions answered   Review of systems:    In addition to the HPI above,   A full Review of  Systems was done, all other systems reviewed are negative except as noted above in HPI , .    Social History:  Reviewed by me    Social History   Tobacco Use  . Smoking status: Never Smoker  . Smokeless tobacco: Never Used  Substance Use Topics  . Alcohol use: No       Family History :  Reviewed by me    Family History  Problem Relation Age of Onset  . Diabetes Mother   . Hypertension Mother   . Arthritis Mother   . Heart failure Mother   . Migraines Mother   . Kidney failure Mother   . Leukemia Father   . Migraines Father   . Kidney failure Father   . Diabetes Sister   . Hypertension Sister   . Diabetes Brother   . Hypertension Brother   . Hypertension Brother   . Hypertension Brother   . Hypertension Brother   . Diabetes Brother   . Diabetes Brother   . Diabetes Brother   . Pulmonary embolism Sister   . Migraines Sister   . Kidney failure Brother   . Colon cancer Neg Hx   . Colon polyps Neg Hx      Home Medications:   Prior to Admission medications   Medication Sig Start Date End Date Taking? Authorizing Provider  acetaminophen (TYLENOL) 500 MG tablet Take one tablet two times daily for chronic pain Patient taking differently: Take 500 mg by  mouth in the morning and at bedtime. for chronic pain 04/19/19   [name], MD  albuterol (VENTOLIN HFA) 108 (90 Base) MCG/ACT inhaler Inhale 2 puffs into the lungs every 6 (six) hours as needed for wheezing or shortness of breath. 02/27/18   Denette Hass, MD  amLODipine (NORVASC) 10 MG tablet TAKE 1 TABLET BY MOUTH ONCE DAILY. 07/17/19   [name], MD  amLODipine (NORVASC) 10 MG tablet Take 1 tablet (10 mg total) by mouth daily. 11/28/19   [name], MD  atorvastatin (LIPITOR) 40 MG tablet Take 1 tablet (40 mg total) by mouth daily. 11/28/19   [name], MD  busPIRone (BUSPAR) 7.5 MG tablet Take 1 tablet (7.5 mg total) by mouth 3 (three) times daily. 11/28/19   [name], MD  cloNIDine (CATAPRES) 0.2 MG tablet TAKE ONE TABLET BY MOUTH AT BEDTIME. 07/27/19   [name], MD  clotrimazole-betamethasone (LOTRISONE) cream Apply 1 application topically 2 (two) times daily. D/c vusion ointment 04/19/19   [name], MD  COMBIGAN 0.2-0.5 % ophthalmic solution Place 1 drop into both eyes 2 (two) times daily. 03/23/17   [provider]  conjugated estrogens (PREMARIN) vaginal cream Place vaginally 2 (two) times a week. Apply sparingly ( fingertip)  Twice weekly to vaginal area on Wednesdays and Fridays Patient not taking: Reported on 11/28/2019 01/30/18   Murlean Iba, MD  dabigatran (PRADAXA) 150 MG CAPS capsule Take 1 capsule (150 mg total) by mouth 2 (two) times daily. 04/25/19   Johnson, Clanford L, MD  docusate sodium (COLACE) 100 MG  capsule Take 1 capsule (100 mg total) by mouth daily as needed for mild constipation. Patient taking differently: Take 100 mg by mouth daily.  03/20/18   Hosie Poisson, MD  donepezil (ARICEPT) 10 MG tablet Take 1 tablet (10 mg total) by mouth at bedtime. 11/28/19   [name], MD  Fluoxetine HCl, PMDD, 10 MG TABS Take 1 tablet (10 mg total) by mouth daily. 01/27/18   Johnson, Clanford L, MD    glucose blood (ACCU-CHEK AVIVA PLUS) test strip USE TO TEST BLOOD SUGAR TWICE DAILY. 08/27/19   [name], MD  GNP IRON 142 (45 Fe) MG TBCR Take 1 tablet by mouth with breakfast, with lunch, and with evening meal. 11/13/19   [name], MD  hydrOXYzine (VISTARIL) 25 MG capsule Take 1 capsule (25 mg total) by mouth 3 (three) times daily. 11/28/19   [name], MD  imipramine (TOFRANIL) 50 MG tablet Take two tablets at bedtime 11/28/19   [name], MD  insulin glargine (LANTUS) 100 unit/mL SOPN Inject 70 Units into the skin daily. 12/01/19   [name], MD  Insulin Syringe-Needle U-100 32G X 5/16" 1 ML MISC 1 each by Does not apply route in the morning and at bedtime. Use to inject insulin twice daily dx e11.65 05/16/19   Perlie Mayo, NP  meclizine (ANTIVERT) 25 MG tablet Take 1 tablet (25 mg total) by mouth 3 (three) times daily as needed for dizziness. 11/28/19   [name], MD  montelukast (SINGULAIR) 10 MG tablet TAKE ONE TABLET BY MOUTH AT BEDTIME. 08/27/19   [name], MD  nystatin (MYCOSTATIN/NYSTOP) powder Apply topically daily as needed (for rash under breasts). 12/04/18   [name], MD  olopatadine (PATANOL) 0.1 % ophthalmic solution Place 1 drop into both eyes 2 (two) times daily. 01/27/18   Johnson, Clanford L, MD  omeprazole (PRILOSEC) 40 MG capsule TAKE (1) CAPSULE BY MOUTH TWICE DAILY. 07/27/19   [name], MD  oxyCODONE-acetaminophen [facility]) 10-325 MG tablet Take one tablet by mouth two times daily 12/21/19 01/20/20  [name], MD  oxyCODONE-acetaminophen [facility]) 10-325 MG tablet Take one tablet by mouth two times daily for pain 01/20/20 02/19/20  [name], MD  oxyCODONE-acetaminophen (PERCOCET) 10-325 MG tablet Take one tablet by mouth two times daily for pain 02/19/20 03/20/20  [name], MD  SUMAtriptan (IMITREX) 25 MG tablet TAKE 1 TABLET BY MOUTH AS NEEDED FOR MIGRAINE.  MAY REPEAT IN 2 HOURS IF HEADACHE PERSISTS OR RECURS. 07/19/19   [name], MD  topiramate (TOPAMAX) 100 MG tablet TAKE 1 TABLET BY MOUTH TWICE DAILY 08/27/19   [name], MD     Allergies:     Allergies  Allergen Reactions  . Penicillins Itching    Did it involve swelling of the face/tongue/throat, SOB, or low BP? No Did it involve sudden or severe rash/hives, skin peeling, or any reaction on the inside of your mouth or nose? No Did you need to seek medical attention at a hospital or doctor's office? No When did it last happen?many years ago If all above answers are "NO", may proceed with cephalosporin use.    . Fentanyl Itching  . Sulfonamide Derivatives Hives  . Codeine Itching and Rash    tussionex  Is tolerated by patient, no phenergan dm      Physical Exam:   Vitals  Blood pressure (!) 157/145, pulse 100, temperature 98.7 F (37.1 C),  temperature source Oral, resp. rate 20, height 5\' 7"  (1.702 m), weight 117.9 kg, SpO2 100 %.  Physical Examination: General appearance - alert, obese appearing, and in no distress and Mental status - alert, oriented to person, place, and time,  Eyes - sclera anicteric Neck - supple, no JVD elevation , Chest - clear  to auscultation bilaterally, symmetrical air movement,  Heart - S1 and S2 normal, irregular  Abdomen - soft, nontender, nondistended, no masses or organomegaly Neurological - generalized weakness without discernible new acute findings, moving all extremities Extremities - no pedal edema noted, intact peripheral pulses  Skin - warm, dry     Data Review:    CBC Recent Labs  Lab 01/09/20 1525  WBC 9.8  HGB 13.2  HCT 42.1  PLT 312  MCV 89.4  MCH 28.0  MCHC 31.4  RDW 13.5  LYMPHSABS 1.9  MONOABS 0.6  EOSABS 0.0  BASOSABS 0.0   ------------------------------------------------------------------------------------------------------------------  Chemistries  Recent Labs  Lab 01/09/20 1525    NA 135  K 3.7  CL 100  CO2 25  GLUCOSE 225*  BUN 14  CREATININE 1.26*  CALCIUM 9.2  AST 20  ALT 22  ALKPHOS 111  BILITOT 0.3   ------------------------------------------------------------------------------------------------------------------ estimated creatinine clearance is 47.3 mL/min (A) (by C-G formula based on SCr of 1.26 mg/dL (H)). ------------------------------------------------------------------------------------------------------------------ No results for input(s): TSH, T4TOTAL, T3FREE, THYROIDAB in the last 72 hours.  Invalid input(s): FREET3   Coagulation profile No results for input(s): INR, PROTIME in the last 168 hours. ------------------------------------------------------------------------------------------------------------------- No results for input(s): DDIMER in the last 72 hours. -------------------------------------------------------------------------------------------------------------------  Cardiac Enzymes No results for input(s): CKMB, TROPONINI, MYOGLOBIN in the last 168 hours.  Invalid input(s): CK ------------------------------------------------------------------------------------------------------------------    Component Value Date/Time   BNP 63.0 01/24/2018 2035     ---------------------------------------------------------------------------------------------------------------  Urinalysis    Component Value Date/Time   COLORURINE YELLOW 01/09/2020 1631   APPEARANCEUR CLOUDY (A) 01/09/2020 1631   LABSPEC 1.016 01/09/2020 1631   PHURINE 6.0 01/09/2020 1631   GLUCOSEU NEGATIVE 01/09/2020 1631   HGBUR SMALL (A) 01/09/2020 1631   HGBUR negative 01/06/2010 0929   BILIRUBINUR NEGATIVE 01/09/2020 1631   BILIRUBINUR moderate 11/17/2016 1438   KETONESUR 5 (A) 01/09/2020 1631   PROTEINUR >=300 (A) 01/09/2020 1631   UROBILINOGEN >=8.0 (A) 11/17/2016 1438   UROBILINOGEN 0.2 10/12/2011 1556   NITRITE NEGATIVE 01/09/2020 1631   LEUKOCYTESUR  SMALL (A) 01/09/2020 1631    ----------------------------------------------------------------------------------------------------------------   Imaging Results:    DG Shoulder Right  Result Date: 01/09/2020 CLINICAL DATA:  Status post fall. EXAM: RIGHT SHOULDER - 2+ VIEW COMPARISON:  None. FINDINGS: There is no evidence of an acute fracture or dislocation. Mild to moderate severity degenerative changes seen involving the right acromioclavicular joint and right glenohumeral articulation. Soft tissues are unremarkable. IMPRESSION: 1. Mild to moderate severity degenerative changes. Electronically Signed   By: Virgina Norfolk M.D.   On: 01/09/2020 16:32   CT HEAD WO CONTRAST  Result Date: 01/09/2020 CLINICAL DATA:  Head trauma.  Fall. EXAM: CT HEAD WITHOUT CONTRAST TECHNIQUE: Contiguous axial images were obtained from the base of the skull through the vertex without intravenous contrast. COMPARISON:  CT head April 14, 2017 FINDINGS: Brain: No evidence of acute infarction, hemorrhage, hydrocephalus, extra-axial collection or mass lesion/mass effect. Patchy white matter hypoattenuation, compatible with chronic microvascular ischemic disease. Vascular: Calcific atherosclerosis. Skull: No acute fracture. Sinuses/Orbits: No acute finding. Other: No mastoid effusions. IMPRESSION: No evidence of acute intracranial abnormality. Electronically Signed  By: Margaretha Sheffield MD   On: 01/09/2020 15:50   DG Knee Complete 4 Views Right  Result Date: 01/09/2020 CLINICAL DATA:  Status post fall. EXAM: RIGHT KNEE - COMPLETE 4+ VIEW COMPARISON:  None. FINDINGS: No evidence of an acute fracture or dislocation. There is marked severity narrowing of the medial and lateral tibiofemoral compartment spaces. Marked severity patellofemoral narrowing is also seen. A small joint effusion is noted. IMPRESSION: 1. Severe tricompartmental degenerative changes with a small joint effusion. Electronically Signed   By: Virgina Norfolk M.D.   On: 01/09/2020 16:31   DG HIPS BILAT WITH PELVIS MIN 5 VIEWS  Result Date: 01/09/2020 CLINICAL DATA:  Status post fall. EXAM: DG HIP (WITH OR WITHOUT PELVIS) 5+V BILAT COMPARISON:  None. FINDINGS: There is no evidence of hip fracture or dislocation. Mild degenerative changes seen in the form of joint space narrowing and acetabular sclerosis. IMPRESSION: No acute osseous injury. Electronically Signed   By: Virgina Norfolk M.D.   On: 01/09/2020 16:36    Radiological Exams on Admission: DG Shoulder Right  Result Date: 01/09/2020 CLINICAL DATA:  Status post fall. EXAM: RIGHT SHOULDER - 2+ VIEW COMPARISON:  None. FINDINGS: There is no evidence of an acute fracture or dislocation. Mild to moderate severity degenerative changes seen involving the right acromioclavicular joint and right glenohumeral articulation. Soft tissues are unremarkable. IMPRESSION: 1. Mild to moderate severity degenerative changes. Electronically Signed   By: Virgina Norfolk M.D.   On: 01/09/2020 16:32   CT HEAD WO CONTRAST  Result Date: 01/09/2020 CLINICAL DATA:  Head trauma.  Fall. EXAM: CT HEAD WITHOUT CONTRAST TECHNIQUE: Contiguous axial images were obtained from the base of the skull through the vertex without intravenous contrast. COMPARISON:  CT head April 14, 2017 FINDINGS: Brain: No evidence of acute infarction, hemorrhage, hydrocephalus, extra-axial collection or mass lesion/mass effect. Patchy white matter hypoattenuation, compatible with chronic microvascular ischemic disease. Vascular: Calcific atherosclerosis. Skull: No acute fracture. Sinuses/Orbits: No acute finding. Other: No mastoid effusions. IMPRESSION: No evidence of acute intracranial abnormality. Electronically Signed   By: Margaretha Sheffield MD   On: 01/09/2020 15:50   DG Knee Complete 4 Views Right  Result Date: 01/09/2020 CLINICAL DATA:  Status post fall. EXAM: RIGHT KNEE - COMPLETE 4+ VIEW COMPARISON:  None. FINDINGS: No evidence  of an acute fracture or dislocation. There is marked severity narrowing of the medial and lateral tibiofemoral compartment spaces. Marked severity patellofemoral narrowing is also seen. A small joint effusion is noted. IMPRESSION: 1. Severe tricompartmental degenerative changes with a small joint effusion. Electronically Signed   By: Virgina Norfolk M.D.   On: 01/09/2020 16:31   DG HIPS BILAT WITH PELVIS MIN 5 VIEWS  Result Date: 01/09/2020 CLINICAL DATA:  Status post fall. EXAM: DG HIP (WITH OR WITHOUT PELVIS) 5+V BILAT COMPARISON:  None. FINDINGS: There is no evidence of hip fracture or dislocation. Mild degenerative changes seen in the form of joint space narrowing and acetabular sclerosis. IMPRESSION: No acute osseous injury. Electronically Signed   By: Virgina Norfolk M.D.   On: 01/09/2020 16:36   Family Communication: Admission, patients condition and plan of care including tests being ordered have been discussed with the patient and patient's son, patient's daughter and patient's sister who indicate understanding and agree with the plan   Likely DC to  home  Condition   Stable  Roxan Hockey M.D on 01/09/2020 at 6:47 PM Go to [email or web address] -  for contact info  [facility] - Office  [phone]  

## 2020-01-09 NOTE — ED Notes (Signed)
Called [facility] for transport back home to [place].

## 2020-01-09 NOTE — Discharge Instructions (Signed)
Take your next dose of the antibiotic prescribed tomorrow morning.  Make sure you are drinking plenty of fluids to help flush this infection.  Urine culture has been sent in the event the antibiotic is not working, this will help [place] decide if you need different antibiotic.  Plan to see your primary doctor for recheck per above.  Return here for any worsening symptoms including fever, vomiting or increasing confusion.  Your x-rays are negative for any acute injuries today.

## 2020-01-09 NOTE — ED Notes (Signed)
Pt waiting for EMS transport home.

## 2020-01-09 NOTE — ED Triage Notes (Signed)
Pt arrives via EMS, coming from home.  Reports that patient had a fall today with no LOC.  PT reports right hip, knee pain as well as worsening back pain.  EMS reports that patient is A&Ox2 which is abnormal.  Pt was hypertensive with EMS, reported BP of 200/111.  PT alert and oriented x2 during triage.  Denies hitting head and is on blood thinners.

## 2020-01-10 DIAGNOSIS — I10 Essential (primary) hypertension: Secondary | ICD-10-CM | POA: Diagnosis not present

## 2020-01-12 LAB — URINE CULTURE: Culture: >=100000 — AB

## 2020-01-13 ENCOUNTER — Telehealth (HOSPITAL_BASED_OUTPATIENT_CLINIC_OR_DEPARTMENT_OTHER): Payer: Self-pay | Admitting: Emergency Medicine

## 2020-01-13 NOTE — Telephone Encounter (Signed)
Post ED Visit - Positive Culture Follow-up  Culture report reviewed by antimicrobial stewardship pharmacist: [place] [facility] []  Nathan Batchelder, Pharm.D. []  [address], Pharm.D., BCPS AQ-ID []  Heide Guile, Pharm.D., BCPS []  Parks Neptune, Pharm.D., BCPS []  [place], Pharm.D., BCPS, AAHIVP []  [place], Pharm.D., BCPS, AAHIVP [x]  Legrand Como, PharmD, BCPS []  Salome Arnt, PharmD, BCPS []  Johnnette Gourd, PharmD, BCPS []  Hughes Better, PharmD []  Leeroy Cha, PharmD, BCPS []  Laqueta Linden, PharmD  [place] [facility] []  [address], PharmD []  Leodis Sias, PharmD []  Lindell Spar, PharmD []  Royetta Asal, Rph []  Graylin Shiver) Rema Fendt, PharmD []  Glennon Mac, PharmD []  Arlyn Dunning, PharmD []  Netta Cedars, PharmD []  Dia Sitter, PharmD []  Leone Haven, PharmD []  Gretta Arab, PharmD []  Theodis Shove, PharmD []  Peggyann Juba, PharmD   Positive urine culture Treated with Cephalexin, organism sensitive to the same and no further patient follow-up is required at this time.  Reuel Boom Iriana Artley 01/13/2020, 11:59 AM

## 2020-01-26 DIAGNOSIS — R0902 Hypoxemia: Secondary | ICD-10-CM | POA: Diagnosis not present

## 2020-01-26 DIAGNOSIS — J42 Unspecified chronic bronchitis: Secondary | ICD-10-CM | POA: Diagnosis not present

## 2020-02-26 DIAGNOSIS — J42 Unspecified chronic bronchitis: Secondary | ICD-10-CM | POA: Diagnosis not present

## 2020-02-26 DIAGNOSIS — R0902 Hypoxemia: Secondary | ICD-10-CM | POA: Diagnosis not present

## 2020-03-10 ENCOUNTER — Other Ambulatory Visit: Payer: Self-pay | Admitting: Cardiology

## 2020-03-10 ENCOUNTER — Other Ambulatory Visit: Payer: Self-pay | Admitting: Family Medicine

## 2020-03-10 DIAGNOSIS — D649 Anemia, unspecified: Secondary | ICD-10-CM

## 2020-03-11 ENCOUNTER — Telehealth (INDEPENDENT_AMBULATORY_CARE_PROVIDER_SITE_OTHER): Payer: Medicare HMO | Admitting: Family Medicine

## 2020-03-11 ENCOUNTER — Encounter: Payer: Self-pay | Admitting: Family Medicine

## 2020-03-11 ENCOUNTER — Other Ambulatory Visit: Payer: Self-pay

## 2020-03-11 DIAGNOSIS — F418 Other specified anxiety disorders: Secondary | ICD-10-CM

## 2020-03-11 DIAGNOSIS — Z794 Long term (current) use of insulin: Secondary | ICD-10-CM

## 2020-03-11 DIAGNOSIS — E1165 Type 2 diabetes mellitus with hyperglycemia: Secondary | ICD-10-CM | POA: Diagnosis not present

## 2020-03-11 DIAGNOSIS — N3946 Mixed incontinence: Secondary | ICD-10-CM | POA: Diagnosis not present

## 2020-03-11 DIAGNOSIS — M159 Polyosteoarthritis, unspecified: Secondary | ICD-10-CM

## 2020-03-11 DIAGNOSIS — G8929 Other chronic pain: Secondary | ICD-10-CM

## 2020-03-11 DIAGNOSIS — Z7189 Other specified counseling: Secondary | ICD-10-CM

## 2020-03-11 DIAGNOSIS — E1149 Type 2 diabetes mellitus with other diabetic neurological complication: Secondary | ICD-10-CM | POA: Diagnosis not present

## 2020-03-11 MED ORDER — AMLODIPINE BESYLATE 10 MG PO TABS
10.0000 mg | ORAL_TABLET | Freq: Every day | ORAL | 1 refills | Status: DC
Start: 2020-03-11 — End: 2020-09-24

## 2020-03-11 MED ORDER — ATORVASTATIN CALCIUM 40 MG PO TABS
40.0000 mg | ORAL_TABLET | Freq: Every day | ORAL | 3 refills | Status: DC
Start: 2020-03-11 — End: 2021-02-12

## 2020-03-11 MED ORDER — INSULIN NPH ISOPHANE & REGULAR (70-30) 100 UNIT/ML ~~LOC~~ SUSP: SUBCUTANEOUS | 11 refills | Status: DC

## 2020-03-11 MED ORDER — BUSPIRONE HCL 7.5 MG PO TABS
7.5000 mg | ORAL_TABLET | Freq: Three times a day (TID) | ORAL | 5 refills | Status: DC
Start: 2020-03-11 — End: 2021-01-01

## 2020-03-11 MED ORDER — NYSTATIN 100000 UNIT/GM EX POWD
Freq: Every day | CUTANEOUS | 3 refills | Status: DC | PRN
Start: 2020-03-11 — End: 2020-08-14

## 2020-03-11 MED ORDER — CLONIDINE HCL 0.2 MG PO TABS
0.2000 mg | ORAL_TABLET | Freq: Every day | ORAL | 3 refills | Status: DC
Start: 2020-03-11 — End: 2021-03-05

## 2020-03-11 NOTE — Patient Instructions (Addendum)
F/U in 12 weeks with MD, call if you need me sooner  Please sign pain management agreement which will be left at front desk tomorrow, based on our discussion today, your sister [name] will sign on your behalf  We will refer you to home  Health for in home PT twice weekly x 4 weeks, also  Twice monthly nurse visits medication, management , vitals, and request as soon as possible HBa1C, non fasting chem 7 and EGFR  New for your diabetes is novolin 70/30 , 45 units twice daily as before  Stop lantus this is no longer the insulin that you are using  You do need physical therapy and occupational therapy  due to severe arthritis causing imbalance and falls.  We will request a wheelchair for use inside and outside of the home for you also  Thanks for choosing [facility], we consider it a privelige to serve you.

## 2020-03-11 NOTE — Progress Notes (Signed)
, needs PT to improve safety and mobility in her homeVirtual Visit via Telephone Note  I connected with [name] on 03/11/20 at  1:40 PM EST by telephone and verified that I am speaking with the correct person using two identifiers.  Location: Patient: home Provider: work   I discussed the limitations, risks, security and privacy concerns of performing an evaluation and management service by telephone and the availability of in person appointments. I also discussed with the patient that there may be a patient responsible charge related to this service. The patient expressed understanding and agreed to proceed.   History of Present Illness: History primarily from sister Sherre Scarlet is able to and does communicate effectively   Increased shortness of breath and reduced mobility, essentially confined to  her bedroom. Needs a wheelchair, with leg elevator, needs PT to improve safe mobility and independence in her home, she is in agreement with this, on previous occasions, when referred , she has refused service, now her daughter is ill and she wants to be better able to help her Most recent fall was 11/24 which took her to the ED Less inclined and able to leave her home as she has severe arthritis in back and knees Blood sugar reportedly uncontrolled and fluctuating a lot, now family prefers insulin mix and states her daughter is now able to test blood sugar Denies recent fever or chills. Denies sinus pressure, nasal congestion, ear pain or sore throat. Denies chest congestion, productive cough or wheezing. Denies chest pains,  Denies abdominal pain, nausea, vomiting,diarrhea or constipation.   Denies dysuria, frequency, hesitancy does have  incontinence. C/o chronic  joint pain,  and limitation in mobility. Denies , seizures,  Denies uncontrolled depression, anxiety or insomnia. Denies skin break down or rash.     Observations/Objective: There were no vitals taken for  this visit. Limited  communication No signs of respiratory distress during speech    Assessment and Plan: Generalized osteoarthritis Disabling arthritis of spine and large joints, marked limitation in safe mobility, essentially in bed most of her awake hours, and has a high fall risk due osteoarthritis and recurrent falls. Refer for in home PT/OT twice weekly for 4 to 6 weeks  Encounter for chronic pain management The patient's Controlled Substance registry is reviewed and compliance confirmed. Adequacy of  Pain control and level of function is assessed. Medication dosing is adjusted as deemed appropriate. Twelve weeks of medication is prescribed , with a follow up appointment between 11 to 12 weeks .   Pain management contract discussed Contract discussed and patient in agreement , no questions or concerns re contract voiced, and there is clear/complete understanding  Type 2 diabetes mellitus with neurological complications ([place]) Ms. Rokosz is reminded of the importance of commitment to daily physical activity for 30 minutes or more, as able and the need to limit carbohydrate intake to 30 to 60 grams per meal to help with blood sugar control.   The need to take medication as prescribed, test blood sugar as directed, and to call between visits if there is a concern that blood sugar is uncontrolled is also discussed.   Ms. Robbs is reminded of the importance of daily foot exam, annual eye examination, and good blood sugar, blood pressure and cholesterol control.  Diabetic Labs Latest Ref Rng & Units 01/09/2020 11/28/2019 08/27/2019 05/22/2019 04/24/2019  HbA1c <5.7 % of total Hgb - 9.9(H) 8.5(H) - -  Microalbumin mg/dL - - - - -  Micro/Creat Ratio <  30 mcg/mg creat - - - - -  Chol <200 mg/dL - 138 - - -  HDL > OR = 50 mg/dL - 57 - - -  Calc LDL mg/dL (calc) - 56 - - -  Triglycerides <150 mg/dL - 171(H) - - -  Creatinine 0.44 - 1.00 mg/dL 1.26(H) 1.20(H) 1.18(H) 1.09(H) 1.08(H)   BP/Weight  01/10/2020 01/09/2020 11/28/2019 09/10/2019 07/18/2019 8/41/6606 3/0/1601  Systolic BP 093 - 235 573 220 254 270  Diastolic BP 82 - 84 80 80 75 103  Wt. (Lbs) - 260 280 243 243 235 -  BMI - 40.72 43.85 38.06 38.06 36.81 -   Foot/eye exam completion dates Latest Ref Rng & Units 11/28/2019 07/18/2019  Eye Exam No Retinopathy - -  Foot exam Order - - -  Foot Form Completion - Done Done      Updated lab needed at/ before next visit. Request HHN to draw blood as homebound Change insulin to mix per family request, remains uncontrolled   Urinary incontinence Requires incontinence supplies   Morbid obesity [facility])  Patient re-educated about  the importance of commitment to a  minimum of 150 minutes of exercise per week as able.  The importance of healthy food choices with portion control discussed, as well as eating regularly and within a 12 hour window most days. The need to choose "clean , green" food 50 to 75% of the time is discussed, as well as to make water the primary drink and set a goal of 64 ounces water daily.    Weight /BMI 01/09/2020 11/28/2019 09/10/2019  WEIGHT 260 lb 280 lb 243 lb  HEIGHT 5\' 7"  5\' 7"  5\' 7"   BMI 40.72 kg/m2 43.85 kg/m2 38.06 kg/m2      Depression with anxiety Controlled, no change in medication     Follow Up Instructions:    I discussed the assessment and treatment plan with the patient. The patient was provided an opportunity to ask questions and all were answered. The patient agreed with the plan and demonstrated an understanding of the instructions.   The patient was advised to call back or seek an in-person evaluation if the symptoms worsen or if the condition fails to improve as anticipated.  I provided 28 minutes of non-face-to-face time during this encounter.   Tula Nakayama, MD

## 2020-03-12 ENCOUNTER — Other Ambulatory Visit: Payer: Self-pay

## 2020-03-12 MED ORDER — UNABLE TO FIND: 0 refills | Status: AC

## 2020-03-15 ENCOUNTER — Encounter: Payer: Self-pay | Admitting: Family Medicine

## 2020-03-15 DIAGNOSIS — Z7189 Other specified counseling: Secondary | ICD-10-CM | POA: Insufficient documentation

## 2020-03-15 MED ORDER — OXYCODONE-ACETAMINOPHEN 10-325 MG PO TABS
ORAL_TABLET | ORAL | 0 refills | Status: DC
Start: 2020-05-21 — End: 2020-08-14

## 2020-03-15 MED ORDER — OXYCODONE-ACETAMINOPHEN 10-325 MG PO TABS
ORAL_TABLET | ORAL | 0 refills | Status: AC
Start: 2020-04-21 — End: 2020-05-21

## 2020-03-15 MED ORDER — OXYCODONE-ACETAMINOPHEN 10-325 MG PO TABS
ORAL_TABLET | ORAL | 0 refills | Status: DC
Start: 2020-03-22 — End: 2020-08-14

## 2020-03-15 NOTE — Assessment & Plan Note (Signed)
  Patient re-educated about  the importance of commitment to a  minimum of 150 minutes of exercise per week as able.  The importance of healthy food choices with portion control discussed, as well as eating regularly and within a 12 hour window most days. The need to choose "clean , green" food 50 to 75% of the time is discussed, as well as to make water the primary drink and set a goal of 64 ounces water daily.    Weight /BMI 01/09/2020 11/28/2019 09/10/2019  WEIGHT 260 lb 280 lb 243 lb  HEIGHT 5\' 7"  5\' 7"  5\' 7"   BMI 40.72 kg/m2 43.85 kg/m2 38.06 kg/m2

## 2020-03-15 NOTE — Assessment & Plan Note (Signed)
Disabling arthritis of spine and large joints, marked limitation in safe mobility, essentially in bed most of her awake hours, and has a high fall risk due osteoarthritis and recurrent falls. Refer for in home PT/OT twice weekly for 4 to 6 weeks

## 2020-03-15 NOTE — Assessment & Plan Note (Signed)
Controlled, no change in medication  

## 2020-03-15 NOTE — Assessment & Plan Note (Signed)
[name] is reminded of the importance of commitment to daily physical activity for 30 minutes or more, as able and the need to limit carbohydrate intake to 30 to 60 grams per meal to help with blood sugar control.   The need to take medication as prescribed, test blood sugar as directed, and to call between visits if there is a concern that blood sugar is uncontrolled is also discussed.   [name] is reminded of the importance of daily foot exam, annual eye examination, and good blood sugar, blood pressure and cholesterol control.  Diabetic Labs Latest Ref Rng & Units 01/09/2020 11/28/2019 08/27/2019 05/22/2019 04/24/2019  HbA1c <5.7 % of total Hgb - 9.9(H) 8.5(H) - -  Microalbumin mg/dL - - - - -  Micro/Creat Ratio <30 mcg/mg creat - - - - -  Chol <200 mg/dL - 138 - - -  HDL > OR = 50 mg/dL - 57 - - -  Calc LDL mg/dL (calc) - 56 - - -  Triglycerides <150 mg/dL - 171(H) - - -  Creatinine 0.44 - 1.00 mg/dL 1.26(H) 1.20(H) 1.18(H) 1.09(H) 1.08(H)   BP/Weight 01/10/2020 01/09/2020 11/28/2019 09/10/2019 07/18/2019 8/56/3149 7/0/2637  Systolic BP 858 - 850 277 412 878 676  Diastolic BP 82 - 84 80 80 75 103  Wt. (Lbs) - 260 280 243 243 235 -  BMI - 40.72 43.85 38.06 38.06 36.81 -   Foot/eye exam completion dates Latest Ref Rng & Units 11/28/2019 07/18/2019  Eye Exam No Retinopathy - -  Foot exam Order - - -  Foot Form Completion - Done Done      Updated lab needed at/ before next visit. Request [facility]N to draw blood as homebound Change insulin to mix per family request, remains uncontrolled

## 2020-03-15 NOTE — Assessment & Plan Note (Signed)
Requires incontinence supplies

## 2020-03-15 NOTE — Assessment & Plan Note (Signed)
The patient's Controlled Substance registry is reviewed and compliance confirmed. Adequacy of  Pain control and level of function is assessed. Medication dosing is adjusted as deemed appropriate. Twelve weeks of medication is prescribed , with a follow up appointment between 11 to 12 weeks .  

## 2020-03-15 NOTE — Assessment & Plan Note (Signed)
Contract discussed and patient in agreement , no questions or concerns re contract voiced, and there is clear/complete understanding

## 2020-03-18 DIAGNOSIS — R296 Repeated falls: Secondary | ICD-10-CM | POA: Diagnosis not present

## 2020-03-18 DIAGNOSIS — E1165 Type 2 diabetes mellitus with hyperglycemia: Secondary | ICD-10-CM | POA: Diagnosis not present

## 2020-03-18 DIAGNOSIS — F418 Other specified anxiety disorders: Secondary | ICD-10-CM | POA: Diagnosis not present

## 2020-03-18 DIAGNOSIS — M159 Polyosteoarthritis, unspecified: Secondary | ICD-10-CM | POA: Diagnosis not present

## 2020-03-18 DIAGNOSIS — E1122 Type 2 diabetes mellitus with diabetic chronic kidney disease: Secondary | ICD-10-CM | POA: Diagnosis not present

## 2020-03-18 DIAGNOSIS — M5441 Lumbago with sciatica, right side: Secondary | ICD-10-CM | POA: Diagnosis not present

## 2020-03-18 DIAGNOSIS — G8929 Other chronic pain: Secondary | ICD-10-CM | POA: Diagnosis not present

## 2020-03-18 DIAGNOSIS — N183 Chronic kidney disease, stage 3 unspecified: Secondary | ICD-10-CM | POA: Diagnosis not present

## 2020-03-18 DIAGNOSIS — E1149 Type 2 diabetes mellitus with other diabetic neurological complication: Secondary | ICD-10-CM | POA: Diagnosis not present

## 2020-03-19 DIAGNOSIS — G8929 Other chronic pain: Secondary | ICD-10-CM | POA: Diagnosis not present

## 2020-03-19 DIAGNOSIS — M5441 Lumbago with sciatica, right side: Secondary | ICD-10-CM | POA: Diagnosis not present

## 2020-03-19 DIAGNOSIS — R296 Repeated falls: Secondary | ICD-10-CM | POA: Diagnosis not present

## 2020-03-19 DIAGNOSIS — M159 Polyosteoarthritis, unspecified: Secondary | ICD-10-CM | POA: Diagnosis not present

## 2020-03-19 DIAGNOSIS — E1165 Type 2 diabetes mellitus with hyperglycemia: Secondary | ICD-10-CM | POA: Diagnosis not present

## 2020-03-19 DIAGNOSIS — N183 Chronic kidney disease, stage 3 unspecified: Secondary | ICD-10-CM | POA: Diagnosis not present

## 2020-03-19 DIAGNOSIS — E1122 Type 2 diabetes mellitus with diabetic chronic kidney disease: Secondary | ICD-10-CM | POA: Diagnosis not present

## 2020-03-19 DIAGNOSIS — F418 Other specified anxiety disorders: Secondary | ICD-10-CM | POA: Diagnosis not present

## 2020-03-19 DIAGNOSIS — E1149 Type 2 diabetes mellitus with other diabetic neurological complication: Secondary | ICD-10-CM | POA: Diagnosis not present

## 2020-03-24 DIAGNOSIS — E1149 Type 2 diabetes mellitus with other diabetic neurological complication: Secondary | ICD-10-CM | POA: Diagnosis not present

## 2020-03-24 DIAGNOSIS — R296 Repeated falls: Secondary | ICD-10-CM | POA: Diagnosis not present

## 2020-03-24 DIAGNOSIS — M5441 Lumbago with sciatica, right side: Secondary | ICD-10-CM | POA: Diagnosis not present

## 2020-03-24 DIAGNOSIS — E1122 Type 2 diabetes mellitus with diabetic chronic kidney disease: Secondary | ICD-10-CM | POA: Diagnosis not present

## 2020-03-24 DIAGNOSIS — G8929 Other chronic pain: Secondary | ICD-10-CM | POA: Diagnosis not present

## 2020-03-24 DIAGNOSIS — N183 Chronic kidney disease, stage 3 unspecified: Secondary | ICD-10-CM | POA: Diagnosis not present

## 2020-03-24 DIAGNOSIS — M159 Polyosteoarthritis, unspecified: Secondary | ICD-10-CM | POA: Diagnosis not present

## 2020-03-24 DIAGNOSIS — F418 Other specified anxiety disorders: Secondary | ICD-10-CM | POA: Diagnosis not present

## 2020-03-24 DIAGNOSIS — E1165 Type 2 diabetes mellitus with hyperglycemia: Secondary | ICD-10-CM | POA: Diagnosis not present

## 2020-03-25 DIAGNOSIS — F418 Other specified anxiety disorders: Secondary | ICD-10-CM | POA: Diagnosis not present

## 2020-03-25 DIAGNOSIS — R296 Repeated falls: Secondary | ICD-10-CM | POA: Diagnosis not present

## 2020-03-25 DIAGNOSIS — M159 Polyosteoarthritis, unspecified: Secondary | ICD-10-CM | POA: Diagnosis not present

## 2020-03-25 DIAGNOSIS — E1149 Type 2 diabetes mellitus with other diabetic neurological complication: Secondary | ICD-10-CM | POA: Diagnosis not present

## 2020-03-25 DIAGNOSIS — N183 Chronic kidney disease, stage 3 unspecified: Secondary | ICD-10-CM | POA: Diagnosis not present

## 2020-03-25 DIAGNOSIS — M5441 Lumbago with sciatica, right side: Secondary | ICD-10-CM | POA: Diagnosis not present

## 2020-03-25 DIAGNOSIS — E1122 Type 2 diabetes mellitus with diabetic chronic kidney disease: Secondary | ICD-10-CM | POA: Diagnosis not present

## 2020-03-25 DIAGNOSIS — E1165 Type 2 diabetes mellitus with hyperglycemia: Secondary | ICD-10-CM | POA: Diagnosis not present

## 2020-03-25 DIAGNOSIS — G8929 Other chronic pain: Secondary | ICD-10-CM | POA: Diagnosis not present

## 2020-03-26 DIAGNOSIS — E1122 Type 2 diabetes mellitus with diabetic chronic kidney disease: Secondary | ICD-10-CM | POA: Diagnosis not present

## 2020-03-26 DIAGNOSIS — E1165 Type 2 diabetes mellitus with hyperglycemia: Secondary | ICD-10-CM | POA: Diagnosis not present

## 2020-03-26 DIAGNOSIS — G8929 Other chronic pain: Secondary | ICD-10-CM | POA: Diagnosis not present

## 2020-03-26 DIAGNOSIS — F418 Other specified anxiety disorders: Secondary | ICD-10-CM | POA: Diagnosis not present

## 2020-03-26 DIAGNOSIS — M159 Polyosteoarthritis, unspecified: Secondary | ICD-10-CM | POA: Diagnosis not present

## 2020-03-26 DIAGNOSIS — M5441 Lumbago with sciatica, right side: Secondary | ICD-10-CM | POA: Diagnosis not present

## 2020-03-26 DIAGNOSIS — R296 Repeated falls: Secondary | ICD-10-CM | POA: Diagnosis not present

## 2020-03-26 DIAGNOSIS — E1149 Type 2 diabetes mellitus with other diabetic neurological complication: Secondary | ICD-10-CM | POA: Diagnosis not present

## 2020-03-26 DIAGNOSIS — N183 Chronic kidney disease, stage 3 unspecified: Secondary | ICD-10-CM | POA: Diagnosis not present

## 2020-03-28 DIAGNOSIS — F418 Other specified anxiety disorders: Secondary | ICD-10-CM | POA: Diagnosis not present

## 2020-03-28 DIAGNOSIS — E1165 Type 2 diabetes mellitus with hyperglycemia: Secondary | ICD-10-CM | POA: Diagnosis not present

## 2020-03-28 DIAGNOSIS — E1149 Type 2 diabetes mellitus with other diabetic neurological complication: Secondary | ICD-10-CM | POA: Diagnosis not present

## 2020-03-28 DIAGNOSIS — G8929 Other chronic pain: Secondary | ICD-10-CM | POA: Diagnosis not present

## 2020-03-28 DIAGNOSIS — R296 Repeated falls: Secondary | ICD-10-CM | POA: Diagnosis not present

## 2020-03-28 DIAGNOSIS — N183 Chronic kidney disease, stage 3 unspecified: Secondary | ICD-10-CM | POA: Diagnosis not present

## 2020-03-28 DIAGNOSIS — J42 Unspecified chronic bronchitis: Secondary | ICD-10-CM | POA: Diagnosis not present

## 2020-03-28 DIAGNOSIS — M159 Polyosteoarthritis, unspecified: Secondary | ICD-10-CM | POA: Diagnosis not present

## 2020-03-28 DIAGNOSIS — E1122 Type 2 diabetes mellitus with diabetic chronic kidney disease: Secondary | ICD-10-CM | POA: Diagnosis not present

## 2020-03-28 DIAGNOSIS — M5441 Lumbago with sciatica, right side: Secondary | ICD-10-CM | POA: Diagnosis not present

## 2020-03-28 DIAGNOSIS — R0902 Hypoxemia: Secondary | ICD-10-CM | POA: Diagnosis not present

## 2020-04-01 DIAGNOSIS — M5441 Lumbago with sciatica, right side: Secondary | ICD-10-CM | POA: Diagnosis not present

## 2020-04-01 DIAGNOSIS — N183 Chronic kidney disease, stage 3 unspecified: Secondary | ICD-10-CM | POA: Diagnosis not present

## 2020-04-01 DIAGNOSIS — E1165 Type 2 diabetes mellitus with hyperglycemia: Secondary | ICD-10-CM | POA: Diagnosis not present

## 2020-04-01 DIAGNOSIS — E1122 Type 2 diabetes mellitus with diabetic chronic kidney disease: Secondary | ICD-10-CM | POA: Diagnosis not present

## 2020-04-01 DIAGNOSIS — G8929 Other chronic pain: Secondary | ICD-10-CM | POA: Diagnosis not present

## 2020-04-01 DIAGNOSIS — F418 Other specified anxiety disorders: Secondary | ICD-10-CM | POA: Diagnosis not present

## 2020-04-01 DIAGNOSIS — R296 Repeated falls: Secondary | ICD-10-CM | POA: Diagnosis not present

## 2020-04-01 DIAGNOSIS — M159 Polyosteoarthritis, unspecified: Secondary | ICD-10-CM | POA: Diagnosis not present

## 2020-04-01 DIAGNOSIS — E1149 Type 2 diabetes mellitus with other diabetic neurological complication: Secondary | ICD-10-CM | POA: Diagnosis not present

## 2020-04-04 DIAGNOSIS — E1122 Type 2 diabetes mellitus with diabetic chronic kidney disease: Secondary | ICD-10-CM | POA: Diagnosis not present

## 2020-04-04 DIAGNOSIS — M5441 Lumbago with sciatica, right side: Secondary | ICD-10-CM | POA: Diagnosis not present

## 2020-04-04 DIAGNOSIS — E1165 Type 2 diabetes mellitus with hyperglycemia: Secondary | ICD-10-CM | POA: Diagnosis not present

## 2020-04-04 DIAGNOSIS — M159 Polyosteoarthritis, unspecified: Secondary | ICD-10-CM | POA: Diagnosis not present

## 2020-04-04 DIAGNOSIS — R296 Repeated falls: Secondary | ICD-10-CM | POA: Diagnosis not present

## 2020-04-04 DIAGNOSIS — G8929 Other chronic pain: Secondary | ICD-10-CM | POA: Diagnosis not present

## 2020-04-04 DIAGNOSIS — E1149 Type 2 diabetes mellitus with other diabetic neurological complication: Secondary | ICD-10-CM | POA: Diagnosis not present

## 2020-04-04 DIAGNOSIS — N183 Chronic kidney disease, stage 3 unspecified: Secondary | ICD-10-CM | POA: Diagnosis not present

## 2020-04-04 DIAGNOSIS — F418 Other specified anxiety disorders: Secondary | ICD-10-CM | POA: Diagnosis not present

## 2020-04-06 DIAGNOSIS — R296 Repeated falls: Secondary | ICD-10-CM

## 2020-04-06 DIAGNOSIS — G8929 Other chronic pain: Secondary | ICD-10-CM

## 2020-04-06 DIAGNOSIS — M5441 Lumbago with sciatica, right side: Secondary | ICD-10-CM

## 2020-04-06 DIAGNOSIS — E1122 Type 2 diabetes mellitus with diabetic chronic kidney disease: Secondary | ICD-10-CM

## 2020-04-06 DIAGNOSIS — F418 Other specified anxiety disorders: Secondary | ICD-10-CM | POA: Diagnosis not present

## 2020-04-06 DIAGNOSIS — E1149 Type 2 diabetes mellitus with other diabetic neurological complication: Secondary | ICD-10-CM

## 2020-04-06 DIAGNOSIS — M159 Polyosteoarthritis, unspecified: Secondary | ICD-10-CM

## 2020-04-06 DIAGNOSIS — N183 Chronic kidney disease, stage 3 unspecified: Secondary | ICD-10-CM | POA: Diagnosis not present

## 2020-04-06 DIAGNOSIS — E1165 Type 2 diabetes mellitus with hyperglycemia: Secondary | ICD-10-CM

## 2020-04-07 ENCOUNTER — Telehealth: Payer: Self-pay

## 2020-04-07 DIAGNOSIS — E1149 Type 2 diabetes mellitus with other diabetic neurological complication: Secondary | ICD-10-CM | POA: Diagnosis not present

## 2020-04-07 DIAGNOSIS — G8929 Other chronic pain: Secondary | ICD-10-CM | POA: Diagnosis not present

## 2020-04-07 DIAGNOSIS — N183 Chronic kidney disease, stage 3 unspecified: Secondary | ICD-10-CM | POA: Diagnosis not present

## 2020-04-07 DIAGNOSIS — R296 Repeated falls: Secondary | ICD-10-CM | POA: Diagnosis not present

## 2020-04-07 DIAGNOSIS — E1122 Type 2 diabetes mellitus with diabetic chronic kidney disease: Secondary | ICD-10-CM | POA: Diagnosis not present

## 2020-04-07 DIAGNOSIS — F418 Other specified anxiety disorders: Secondary | ICD-10-CM | POA: Diagnosis not present

## 2020-04-07 DIAGNOSIS — E1165 Type 2 diabetes mellitus with hyperglycemia: Secondary | ICD-10-CM | POA: Diagnosis not present

## 2020-04-07 DIAGNOSIS — M5441 Lumbago with sciatica, right side: Secondary | ICD-10-CM | POA: Diagnosis not present

## 2020-04-07 DIAGNOSIS — M159 Polyosteoarthritis, unspecified: Secondary | ICD-10-CM | POA: Diagnosis not present

## 2020-04-07 NOTE — Telephone Encounter (Signed)
[name] called from [facility] needs a verbal UA order, patient still having burning from UTI. Jessica contact # [phone]

## 2020-04-08 NOTE — Telephone Encounter (Signed)
Pls authorize, thanks

## 2020-04-08 NOTE — Telephone Encounter (Signed)
Verbal order given  

## 2020-04-09 DIAGNOSIS — G8929 Other chronic pain: Secondary | ICD-10-CM | POA: Diagnosis not present

## 2020-04-09 DIAGNOSIS — R296 Repeated falls: Secondary | ICD-10-CM | POA: Diagnosis not present

## 2020-04-09 DIAGNOSIS — M5441 Lumbago with sciatica, right side: Secondary | ICD-10-CM | POA: Diagnosis not present

## 2020-04-09 DIAGNOSIS — E1149 Type 2 diabetes mellitus with other diabetic neurological complication: Secondary | ICD-10-CM | POA: Diagnosis not present

## 2020-04-09 DIAGNOSIS — M159 Polyosteoarthritis, unspecified: Secondary | ICD-10-CM | POA: Diagnosis not present

## 2020-04-09 DIAGNOSIS — N183 Chronic kidney disease, stage 3 unspecified: Secondary | ICD-10-CM | POA: Diagnosis not present

## 2020-04-09 DIAGNOSIS — F418 Other specified anxiety disorders: Secondary | ICD-10-CM | POA: Diagnosis not present

## 2020-04-09 DIAGNOSIS — E1165 Type 2 diabetes mellitus with hyperglycemia: Secondary | ICD-10-CM | POA: Diagnosis not present

## 2020-04-09 DIAGNOSIS — E1122 Type 2 diabetes mellitus with diabetic chronic kidney disease: Secondary | ICD-10-CM | POA: Diagnosis not present

## 2020-04-10 DIAGNOSIS — E1149 Type 2 diabetes mellitus with other diabetic neurological complication: Secondary | ICD-10-CM | POA: Diagnosis not present

## 2020-04-10 DIAGNOSIS — N183 Chronic kidney disease, stage 3 unspecified: Secondary | ICD-10-CM | POA: Diagnosis not present

## 2020-04-10 DIAGNOSIS — F418 Other specified anxiety disorders: Secondary | ICD-10-CM | POA: Diagnosis not present

## 2020-04-10 DIAGNOSIS — G8929 Other chronic pain: Secondary | ICD-10-CM | POA: Diagnosis not present

## 2020-04-10 DIAGNOSIS — E1122 Type 2 diabetes mellitus with diabetic chronic kidney disease: Secondary | ICD-10-CM | POA: Diagnosis not present

## 2020-04-10 DIAGNOSIS — M159 Polyosteoarthritis, unspecified: Secondary | ICD-10-CM | POA: Diagnosis not present

## 2020-04-10 DIAGNOSIS — E1165 Type 2 diabetes mellitus with hyperglycemia: Secondary | ICD-10-CM | POA: Diagnosis not present

## 2020-04-10 DIAGNOSIS — R296 Repeated falls: Secondary | ICD-10-CM | POA: Diagnosis not present

## 2020-04-10 DIAGNOSIS — M5441 Lumbago with sciatica, right side: Secondary | ICD-10-CM | POA: Diagnosis not present

## 2020-04-14 DIAGNOSIS — E1122 Type 2 diabetes mellitus with diabetic chronic kidney disease: Secondary | ICD-10-CM | POA: Diagnosis not present

## 2020-04-14 DIAGNOSIS — N183 Chronic kidney disease, stage 3 unspecified: Secondary | ICD-10-CM | POA: Diagnosis not present

## 2020-04-14 DIAGNOSIS — E1149 Type 2 diabetes mellitus with other diabetic neurological complication: Secondary | ICD-10-CM | POA: Diagnosis not present

## 2020-04-14 DIAGNOSIS — M159 Polyosteoarthritis, unspecified: Secondary | ICD-10-CM | POA: Diagnosis not present

## 2020-04-14 DIAGNOSIS — G8929 Other chronic pain: Secondary | ICD-10-CM | POA: Diagnosis not present

## 2020-04-14 DIAGNOSIS — M5441 Lumbago with sciatica, right side: Secondary | ICD-10-CM | POA: Diagnosis not present

## 2020-04-14 DIAGNOSIS — F418 Other specified anxiety disorders: Secondary | ICD-10-CM | POA: Diagnosis not present

## 2020-04-14 DIAGNOSIS — E1165 Type 2 diabetes mellitus with hyperglycemia: Secondary | ICD-10-CM | POA: Diagnosis not present

## 2020-04-14 DIAGNOSIS — R296 Repeated falls: Secondary | ICD-10-CM | POA: Diagnosis not present

## 2020-04-15 DIAGNOSIS — E1149 Type 2 diabetes mellitus with other diabetic neurological complication: Secondary | ICD-10-CM | POA: Diagnosis not present

## 2020-04-15 DIAGNOSIS — M5441 Lumbago with sciatica, right side: Secondary | ICD-10-CM | POA: Diagnosis not present

## 2020-04-15 DIAGNOSIS — R296 Repeated falls: Secondary | ICD-10-CM | POA: Diagnosis not present

## 2020-04-15 DIAGNOSIS — E1165 Type 2 diabetes mellitus with hyperglycemia: Secondary | ICD-10-CM | POA: Diagnosis not present

## 2020-04-15 DIAGNOSIS — F418 Other specified anxiety disorders: Secondary | ICD-10-CM | POA: Diagnosis not present

## 2020-04-15 DIAGNOSIS — G8929 Other chronic pain: Secondary | ICD-10-CM | POA: Diagnosis not present

## 2020-04-15 DIAGNOSIS — N183 Chronic kidney disease, stage 3 unspecified: Secondary | ICD-10-CM | POA: Diagnosis not present

## 2020-04-15 DIAGNOSIS — M159 Polyosteoarthritis, unspecified: Secondary | ICD-10-CM | POA: Diagnosis not present

## 2020-04-15 DIAGNOSIS — E1122 Type 2 diabetes mellitus with diabetic chronic kidney disease: Secondary | ICD-10-CM | POA: Diagnosis not present

## 2020-04-16 DIAGNOSIS — E1122 Type 2 diabetes mellitus with diabetic chronic kidney disease: Secondary | ICD-10-CM | POA: Diagnosis not present

## 2020-04-16 DIAGNOSIS — F418 Other specified anxiety disorders: Secondary | ICD-10-CM | POA: Diagnosis not present

## 2020-04-16 DIAGNOSIS — G8929 Other chronic pain: Secondary | ICD-10-CM | POA: Diagnosis not present

## 2020-04-16 DIAGNOSIS — R296 Repeated falls: Secondary | ICD-10-CM | POA: Diagnosis not present

## 2020-04-16 DIAGNOSIS — N183 Chronic kidney disease, stage 3 unspecified: Secondary | ICD-10-CM | POA: Diagnosis not present

## 2020-04-16 DIAGNOSIS — E1149 Type 2 diabetes mellitus with other diabetic neurological complication: Secondary | ICD-10-CM | POA: Diagnosis not present

## 2020-04-16 DIAGNOSIS — M5441 Lumbago with sciatica, right side: Secondary | ICD-10-CM | POA: Diagnosis not present

## 2020-04-16 DIAGNOSIS — M159 Polyosteoarthritis, unspecified: Secondary | ICD-10-CM | POA: Diagnosis not present

## 2020-04-16 DIAGNOSIS — E1165 Type 2 diabetes mellitus with hyperglycemia: Secondary | ICD-10-CM | POA: Diagnosis not present

## 2020-04-16 NOTE — Telephone Encounter (Signed)
[name] returning your call. Please call # [phone]

## 2020-04-17 DIAGNOSIS — M159 Polyosteoarthritis, unspecified: Secondary | ICD-10-CM | POA: Diagnosis not present

## 2020-04-17 DIAGNOSIS — F418 Other specified anxiety disorders: Secondary | ICD-10-CM | POA: Diagnosis not present

## 2020-04-17 DIAGNOSIS — M5441 Lumbago with sciatica, right side: Secondary | ICD-10-CM | POA: Diagnosis not present

## 2020-04-17 DIAGNOSIS — E1149 Type 2 diabetes mellitus with other diabetic neurological complication: Secondary | ICD-10-CM | POA: Diagnosis not present

## 2020-04-17 DIAGNOSIS — G8929 Other chronic pain: Secondary | ICD-10-CM | POA: Diagnosis not present

## 2020-04-17 DIAGNOSIS — R296 Repeated falls: Secondary | ICD-10-CM | POA: Diagnosis not present

## 2020-04-17 DIAGNOSIS — E1165 Type 2 diabetes mellitus with hyperglycemia: Secondary | ICD-10-CM | POA: Diagnosis not present

## 2020-04-17 DIAGNOSIS — N183 Chronic kidney disease, stage 3 unspecified: Secondary | ICD-10-CM | POA: Diagnosis not present

## 2020-04-17 DIAGNOSIS — E1122 Type 2 diabetes mellitus with diabetic chronic kidney disease: Secondary | ICD-10-CM | POA: Diagnosis not present

## 2020-04-17 NOTE — Telephone Encounter (Signed)
PT [name] said her BP was 200/100 and had a headache yesterday. Patient had gotten worked up per sister and is fine now

## 2020-04-22 DIAGNOSIS — N183 Chronic kidney disease, stage 3 unspecified: Secondary | ICD-10-CM | POA: Diagnosis not present

## 2020-04-22 DIAGNOSIS — M5441 Lumbago with sciatica, right side: Secondary | ICD-10-CM | POA: Diagnosis not present

## 2020-04-22 DIAGNOSIS — F418 Other specified anxiety disorders: Secondary | ICD-10-CM | POA: Diagnosis not present

## 2020-04-22 DIAGNOSIS — G8929 Other chronic pain: Secondary | ICD-10-CM | POA: Diagnosis not present

## 2020-04-22 DIAGNOSIS — M159 Polyosteoarthritis, unspecified: Secondary | ICD-10-CM | POA: Diagnosis not present

## 2020-04-22 DIAGNOSIS — E1122 Type 2 diabetes mellitus with diabetic chronic kidney disease: Secondary | ICD-10-CM | POA: Diagnosis not present

## 2020-04-22 DIAGNOSIS — R296 Repeated falls: Secondary | ICD-10-CM | POA: Diagnosis not present

## 2020-04-22 DIAGNOSIS — E1165 Type 2 diabetes mellitus with hyperglycemia: Secondary | ICD-10-CM | POA: Diagnosis not present

## 2020-04-22 DIAGNOSIS — E1149 Type 2 diabetes mellitus with other diabetic neurological complication: Secondary | ICD-10-CM | POA: Diagnosis not present

## 2020-04-24 DIAGNOSIS — N183 Chronic kidney disease, stage 3 unspecified: Secondary | ICD-10-CM | POA: Diagnosis not present

## 2020-04-24 DIAGNOSIS — R296 Repeated falls: Secondary | ICD-10-CM | POA: Diagnosis not present

## 2020-04-24 DIAGNOSIS — E1165 Type 2 diabetes mellitus with hyperglycemia: Secondary | ICD-10-CM | POA: Diagnosis not present

## 2020-04-24 DIAGNOSIS — G8929 Other chronic pain: Secondary | ICD-10-CM | POA: Diagnosis not present

## 2020-04-24 DIAGNOSIS — M5441 Lumbago with sciatica, right side: Secondary | ICD-10-CM | POA: Diagnosis not present

## 2020-04-24 DIAGNOSIS — F418 Other specified anxiety disorders: Secondary | ICD-10-CM | POA: Diagnosis not present

## 2020-04-24 DIAGNOSIS — E1149 Type 2 diabetes mellitus with other diabetic neurological complication: Secondary | ICD-10-CM | POA: Diagnosis not present

## 2020-04-24 DIAGNOSIS — E1122 Type 2 diabetes mellitus with diabetic chronic kidney disease: Secondary | ICD-10-CM | POA: Diagnosis not present

## 2020-04-24 DIAGNOSIS — M159 Polyosteoarthritis, unspecified: Secondary | ICD-10-CM | POA: Diagnosis not present

## 2020-04-25 DIAGNOSIS — M159 Polyosteoarthritis, unspecified: Secondary | ICD-10-CM | POA: Diagnosis not present

## 2020-04-25 DIAGNOSIS — E1149 Type 2 diabetes mellitus with other diabetic neurological complication: Secondary | ICD-10-CM | POA: Diagnosis not present

## 2020-04-25 DIAGNOSIS — J42 Unspecified chronic bronchitis: Secondary | ICD-10-CM | POA: Diagnosis not present

## 2020-04-25 DIAGNOSIS — F418 Other specified anxiety disorders: Secondary | ICD-10-CM | POA: Diagnosis not present

## 2020-04-25 DIAGNOSIS — N183 Chronic kidney disease, stage 3 unspecified: Secondary | ICD-10-CM | POA: Diagnosis not present

## 2020-04-25 DIAGNOSIS — E1165 Type 2 diabetes mellitus with hyperglycemia: Secondary | ICD-10-CM | POA: Diagnosis not present

## 2020-04-25 DIAGNOSIS — M5441 Lumbago with sciatica, right side: Secondary | ICD-10-CM | POA: Diagnosis not present

## 2020-04-25 DIAGNOSIS — G8929 Other chronic pain: Secondary | ICD-10-CM | POA: Diagnosis not present

## 2020-04-25 DIAGNOSIS — R0902 Hypoxemia: Secondary | ICD-10-CM | POA: Diagnosis not present

## 2020-04-25 DIAGNOSIS — R296 Repeated falls: Secondary | ICD-10-CM | POA: Diagnosis not present

## 2020-04-25 DIAGNOSIS — E1122 Type 2 diabetes mellitus with diabetic chronic kidney disease: Secondary | ICD-10-CM | POA: Diagnosis not present

## 2020-04-28 ENCOUNTER — Other Ambulatory Visit: Payer: Self-pay | Admitting: Cardiology

## 2020-04-28 ENCOUNTER — Other Ambulatory Visit: Payer: Self-pay | Admitting: Family Medicine

## 2020-04-29 DIAGNOSIS — R296 Repeated falls: Secondary | ICD-10-CM | POA: Diagnosis not present

## 2020-04-29 DIAGNOSIS — F418 Other specified anxiety disorders: Secondary | ICD-10-CM | POA: Diagnosis not present

## 2020-04-29 DIAGNOSIS — M5441 Lumbago with sciatica, right side: Secondary | ICD-10-CM | POA: Diagnosis not present

## 2020-04-29 DIAGNOSIS — E1149 Type 2 diabetes mellitus with other diabetic neurological complication: Secondary | ICD-10-CM | POA: Diagnosis not present

## 2020-04-29 DIAGNOSIS — N183 Chronic kidney disease, stage 3 unspecified: Secondary | ICD-10-CM | POA: Diagnosis not present

## 2020-04-29 DIAGNOSIS — E1165 Type 2 diabetes mellitus with hyperglycemia: Secondary | ICD-10-CM | POA: Diagnosis not present

## 2020-04-29 DIAGNOSIS — M159 Polyosteoarthritis, unspecified: Secondary | ICD-10-CM | POA: Diagnosis not present

## 2020-04-29 DIAGNOSIS — G8929 Other chronic pain: Secondary | ICD-10-CM | POA: Diagnosis not present

## 2020-04-29 DIAGNOSIS — E1122 Type 2 diabetes mellitus with diabetic chronic kidney disease: Secondary | ICD-10-CM | POA: Diagnosis not present

## 2020-04-30 DIAGNOSIS — G8929 Other chronic pain: Secondary | ICD-10-CM | POA: Diagnosis not present

## 2020-04-30 DIAGNOSIS — F418 Other specified anxiety disorders: Secondary | ICD-10-CM | POA: Diagnosis not present

## 2020-04-30 DIAGNOSIS — M5441 Lumbago with sciatica, right side: Secondary | ICD-10-CM | POA: Diagnosis not present

## 2020-04-30 DIAGNOSIS — E1165 Type 2 diabetes mellitus with hyperglycemia: Secondary | ICD-10-CM | POA: Diagnosis not present

## 2020-04-30 DIAGNOSIS — N183 Chronic kidney disease, stage 3 unspecified: Secondary | ICD-10-CM | POA: Diagnosis not present

## 2020-04-30 DIAGNOSIS — R296 Repeated falls: Secondary | ICD-10-CM | POA: Diagnosis not present

## 2020-04-30 DIAGNOSIS — E1122 Type 2 diabetes mellitus with diabetic chronic kidney disease: Secondary | ICD-10-CM | POA: Diagnosis not present

## 2020-04-30 DIAGNOSIS — E1149 Type 2 diabetes mellitus with other diabetic neurological complication: Secondary | ICD-10-CM | POA: Diagnosis not present

## 2020-04-30 DIAGNOSIS — M159 Polyosteoarthritis, unspecified: Secondary | ICD-10-CM | POA: Diagnosis not present

## 2020-05-02 ENCOUNTER — Other Ambulatory Visit: Payer: Self-pay | Admitting: Family Medicine

## 2020-05-05 DIAGNOSIS — E1165 Type 2 diabetes mellitus with hyperglycemia: Secondary | ICD-10-CM | POA: Diagnosis not present

## 2020-05-05 DIAGNOSIS — M5441 Lumbago with sciatica, right side: Secondary | ICD-10-CM | POA: Diagnosis not present

## 2020-05-05 DIAGNOSIS — F418 Other specified anxiety disorders: Secondary | ICD-10-CM | POA: Diagnosis not present

## 2020-05-05 DIAGNOSIS — E1149 Type 2 diabetes mellitus with other diabetic neurological complication: Secondary | ICD-10-CM | POA: Diagnosis not present

## 2020-05-05 DIAGNOSIS — N183 Chronic kidney disease, stage 3 unspecified: Secondary | ICD-10-CM | POA: Diagnosis not present

## 2020-05-05 DIAGNOSIS — R296 Repeated falls: Secondary | ICD-10-CM | POA: Diagnosis not present

## 2020-05-05 DIAGNOSIS — E1122 Type 2 diabetes mellitus with diabetic chronic kidney disease: Secondary | ICD-10-CM | POA: Diagnosis not present

## 2020-05-05 DIAGNOSIS — M159 Polyosteoarthritis, unspecified: Secondary | ICD-10-CM | POA: Diagnosis not present

## 2020-05-05 DIAGNOSIS — G8929 Other chronic pain: Secondary | ICD-10-CM | POA: Diagnosis not present

## 2020-05-06 DIAGNOSIS — N183 Chronic kidney disease, stage 3 unspecified: Secondary | ICD-10-CM | POA: Diagnosis not present

## 2020-05-06 DIAGNOSIS — E1149 Type 2 diabetes mellitus with other diabetic neurological complication: Secondary | ICD-10-CM | POA: Diagnosis not present

## 2020-05-06 DIAGNOSIS — M5441 Lumbago with sciatica, right side: Secondary | ICD-10-CM | POA: Diagnosis not present

## 2020-05-06 DIAGNOSIS — M159 Polyosteoarthritis, unspecified: Secondary | ICD-10-CM | POA: Diagnosis not present

## 2020-05-06 DIAGNOSIS — F418 Other specified anxiety disorders: Secondary | ICD-10-CM | POA: Diagnosis not present

## 2020-05-06 DIAGNOSIS — R296 Repeated falls: Secondary | ICD-10-CM | POA: Diagnosis not present

## 2020-05-06 DIAGNOSIS — E1165 Type 2 diabetes mellitus with hyperglycemia: Secondary | ICD-10-CM | POA: Diagnosis not present

## 2020-05-06 DIAGNOSIS — G8929 Other chronic pain: Secondary | ICD-10-CM | POA: Diagnosis not present

## 2020-05-06 DIAGNOSIS — E1122 Type 2 diabetes mellitus with diabetic chronic kidney disease: Secondary | ICD-10-CM | POA: Diagnosis not present

## 2020-05-14 DIAGNOSIS — E1122 Type 2 diabetes mellitus with diabetic chronic kidney disease: Secondary | ICD-10-CM | POA: Diagnosis not present

## 2020-05-14 DIAGNOSIS — R296 Repeated falls: Secondary | ICD-10-CM | POA: Diagnosis not present

## 2020-05-14 DIAGNOSIS — N183 Chronic kidney disease, stage 3 unspecified: Secondary | ICD-10-CM | POA: Diagnosis not present

## 2020-05-14 DIAGNOSIS — M5441 Lumbago with sciatica, right side: Secondary | ICD-10-CM | POA: Diagnosis not present

## 2020-05-14 DIAGNOSIS — E1165 Type 2 diabetes mellitus with hyperglycemia: Secondary | ICD-10-CM | POA: Diagnosis not present

## 2020-05-14 DIAGNOSIS — E1149 Type 2 diabetes mellitus with other diabetic neurological complication: Secondary | ICD-10-CM | POA: Diagnosis not present

## 2020-05-14 DIAGNOSIS — G8929 Other chronic pain: Secondary | ICD-10-CM | POA: Diagnosis not present

## 2020-05-14 DIAGNOSIS — M159 Polyosteoarthritis, unspecified: Secondary | ICD-10-CM | POA: Diagnosis not present

## 2020-05-14 DIAGNOSIS — F418 Other specified anxiety disorders: Secondary | ICD-10-CM | POA: Diagnosis not present

## 2020-05-15 ENCOUNTER — Other Ambulatory Visit: Payer: Self-pay

## 2020-05-15 DIAGNOSIS — E1149 Type 2 diabetes mellitus with other diabetic neurological complication: Secondary | ICD-10-CM

## 2020-05-15 DIAGNOSIS — I10 Essential (primary) hypertension: Secondary | ICD-10-CM

## 2020-05-15 DIAGNOSIS — N3000 Acute cystitis without hematuria: Secondary | ICD-10-CM

## 2020-05-15 DIAGNOSIS — E785 Hyperlipidemia, unspecified: Secondary | ICD-10-CM

## 2020-05-20 ENCOUNTER — Telehealth: Payer: Self-pay

## 2020-05-20 NOTE — Telephone Encounter (Signed)
Pt sister called and states she missed your phone call and she was unable to talk in depth with you about Markia's glucose. It is still >200 fasting and they are watching her portions and her food. States home health is still coming out and she is not sure if they are reporting her blood sugars.

## 2020-05-21 NOTE — Telephone Encounter (Signed)
Spoke with [name] and sent an order to advanced that her fasting labs need to be drawn asap

## 2020-05-23 ENCOUNTER — Encounter: Payer: Self-pay | Admitting: Family Medicine

## 2020-05-23 DIAGNOSIS — E785 Hyperlipidemia, unspecified: Secondary | ICD-10-CM | POA: Diagnosis not present

## 2020-05-23 DIAGNOSIS — F418 Other specified anxiety disorders: Secondary | ICD-10-CM | POA: Diagnosis not present

## 2020-05-23 DIAGNOSIS — E1122 Type 2 diabetes mellitus with diabetic chronic kidney disease: Secondary | ICD-10-CM | POA: Diagnosis not present

## 2020-05-23 DIAGNOSIS — E1149 Type 2 diabetes mellitus with other diabetic neurological complication: Secondary | ICD-10-CM | POA: Diagnosis not present

## 2020-05-23 DIAGNOSIS — I10 Essential (primary) hypertension: Secondary | ICD-10-CM | POA: Diagnosis not present

## 2020-05-23 DIAGNOSIS — N183 Chronic kidney disease, stage 3 unspecified: Secondary | ICD-10-CM | POA: Diagnosis not present

## 2020-05-23 DIAGNOSIS — R296 Repeated falls: Secondary | ICD-10-CM | POA: Diagnosis not present

## 2020-05-23 DIAGNOSIS — E1165 Type 2 diabetes mellitus with hyperglycemia: Secondary | ICD-10-CM | POA: Diagnosis not present

## 2020-05-23 DIAGNOSIS — M5441 Lumbago with sciatica, right side: Secondary | ICD-10-CM | POA: Diagnosis not present

## 2020-05-23 DIAGNOSIS — G8929 Other chronic pain: Secondary | ICD-10-CM | POA: Diagnosis not present

## 2020-05-23 DIAGNOSIS — M159 Polyosteoarthritis, unspecified: Secondary | ICD-10-CM | POA: Diagnosis not present

## 2020-05-23 DIAGNOSIS — E119 Type 2 diabetes mellitus without complications: Secondary | ICD-10-CM | POA: Diagnosis not present

## 2020-05-26 DIAGNOSIS — J42 Unspecified chronic bronchitis: Secondary | ICD-10-CM | POA: Diagnosis not present

## 2020-05-26 DIAGNOSIS — R0902 Hypoxemia: Secondary | ICD-10-CM | POA: Diagnosis not present

## 2020-05-28 ENCOUNTER — Other Ambulatory Visit: Payer: Self-pay | Admitting: Cardiology

## 2020-05-30 DIAGNOSIS — G8929 Other chronic pain: Secondary | ICD-10-CM | POA: Diagnosis not present

## 2020-05-30 DIAGNOSIS — M5441 Lumbago with sciatica, right side: Secondary | ICD-10-CM | POA: Diagnosis not present

## 2020-05-30 DIAGNOSIS — M159 Polyosteoarthritis, unspecified: Secondary | ICD-10-CM | POA: Diagnosis not present

## 2020-05-30 DIAGNOSIS — F418 Other specified anxiety disorders: Secondary | ICD-10-CM | POA: Diagnosis not present

## 2020-05-30 DIAGNOSIS — E1149 Type 2 diabetes mellitus with other diabetic neurological complication: Secondary | ICD-10-CM | POA: Diagnosis not present

## 2020-05-30 DIAGNOSIS — N183 Chronic kidney disease, stage 3 unspecified: Secondary | ICD-10-CM | POA: Diagnosis not present

## 2020-05-30 DIAGNOSIS — R296 Repeated falls: Secondary | ICD-10-CM | POA: Diagnosis not present

## 2020-05-30 DIAGNOSIS — E1165 Type 2 diabetes mellitus with hyperglycemia: Secondary | ICD-10-CM | POA: Diagnosis not present

## 2020-05-30 DIAGNOSIS — E1122 Type 2 diabetes mellitus with diabetic chronic kidney disease: Secondary | ICD-10-CM | POA: Diagnosis not present

## 2020-06-03 DIAGNOSIS — E1122 Type 2 diabetes mellitus with diabetic chronic kidney disease: Secondary | ICD-10-CM

## 2020-06-03 DIAGNOSIS — E1149 Type 2 diabetes mellitus with other diabetic neurological complication: Secondary | ICD-10-CM

## 2020-06-03 DIAGNOSIS — G8929 Other chronic pain: Secondary | ICD-10-CM | POA: Diagnosis not present

## 2020-06-03 DIAGNOSIS — E1165 Type 2 diabetes mellitus with hyperglycemia: Secondary | ICD-10-CM

## 2020-06-03 DIAGNOSIS — R296 Repeated falls: Secondary | ICD-10-CM

## 2020-06-03 DIAGNOSIS — M159 Polyosteoarthritis, unspecified: Secondary | ICD-10-CM

## 2020-06-03 DIAGNOSIS — F418 Other specified anxiety disorders: Secondary | ICD-10-CM | POA: Diagnosis not present

## 2020-06-03 DIAGNOSIS — N183 Chronic kidney disease, stage 3 unspecified: Secondary | ICD-10-CM

## 2020-06-03 DIAGNOSIS — M5441 Lumbago with sciatica, right side: Secondary | ICD-10-CM

## 2020-06-04 ENCOUNTER — Other Ambulatory Visit: Payer: Self-pay | Admitting: Family Medicine

## 2020-06-04 DIAGNOSIS — M159 Polyosteoarthritis, unspecified: Secondary | ICD-10-CM | POA: Diagnosis not present

## 2020-06-04 DIAGNOSIS — F418 Other specified anxiety disorders: Secondary | ICD-10-CM | POA: Diagnosis not present

## 2020-06-04 DIAGNOSIS — E1165 Type 2 diabetes mellitus with hyperglycemia: Secondary | ICD-10-CM | POA: Diagnosis not present

## 2020-06-04 DIAGNOSIS — E1122 Type 2 diabetes mellitus with diabetic chronic kidney disease: Secondary | ICD-10-CM | POA: Diagnosis not present

## 2020-06-04 DIAGNOSIS — E1149 Type 2 diabetes mellitus with other diabetic neurological complication: Secondary | ICD-10-CM | POA: Diagnosis not present

## 2020-06-04 DIAGNOSIS — N183 Chronic kidney disease, stage 3 unspecified: Secondary | ICD-10-CM | POA: Diagnosis not present

## 2020-06-04 DIAGNOSIS — G8929 Other chronic pain: Secondary | ICD-10-CM | POA: Diagnosis not present

## 2020-06-04 DIAGNOSIS — M5441 Lumbago with sciatica, right side: Secondary | ICD-10-CM | POA: Diagnosis not present

## 2020-06-04 DIAGNOSIS — R296 Repeated falls: Secondary | ICD-10-CM | POA: Diagnosis not present

## 2020-06-10 ENCOUNTER — Encounter: Payer: Self-pay | Admitting: Family Medicine

## 2020-06-10 ENCOUNTER — Ambulatory Visit (INDEPENDENT_AMBULATORY_CARE_PROVIDER_SITE_OTHER): Payer: Medicare HMO | Admitting: Family Medicine

## 2020-06-10 ENCOUNTER — Other Ambulatory Visit: Payer: Self-pay

## 2020-06-10 DIAGNOSIS — I10 Essential (primary) hypertension: Secondary | ICD-10-CM

## 2020-06-10 DIAGNOSIS — G43109 Migraine with aura, not intractable, without status migrainosus: Secondary | ICD-10-CM | POA: Diagnosis not present

## 2020-06-10 DIAGNOSIS — G8929 Other chronic pain: Secondary | ICD-10-CM

## 2020-06-10 DIAGNOSIS — E1149 Type 2 diabetes mellitus with other diabetic neurological complication: Secondary | ICD-10-CM | POA: Diagnosis not present

## 2020-06-10 MED ORDER — INSULIN NPH ISOPHANE & REGULAR (70-30) 100 UNIT/ML ~~LOC~~ SUSP: 55.0000 [IU] | Freq: Two times a day (BID) | SUBCUTANEOUS | 5 refills | Status: DC

## 2020-06-10 NOTE — Assessment & Plan Note (Addendum)
Uncontrolled, increase insulin dose to 55 units twice daily and also test at least twice daily with 6 week f/u call sooner if blood sugar is staying igh. Endo recommended but family wants to try to better manage diet and meds [name] is reminded of the importance of commitment to daily physical activity for 30 minutes or more, as able and the need to limit carbohydrate intake to 30 to 60 grams per meal to help with blood sugar control.   The need to take medication as prescribed, test blood sugar as directed, and to call between visits if there is a concern that blood sugar is uncontrolled is also discussed.   [name] is reminded of the importance of daily foot exam, annual eye examination, and good blood sugar, blood pressure and cholesterol control.  Diabetic Labs Latest Ref Rng & Units 01/09/2020 11/28/2019 08/27/2019 05/22/2019 04/24/2019  HbA1c <5.7 % of total Hgb - 9.9(H) 8.5(H) - -  Microalbumin mg/dL - - - - -  Micro/Creat Ratio <30 mcg/mg creat - - - - -  Chol <200 mg/dL - 138 - - -  HDL > OR = 50 mg/dL - 57 - - -  Calc LDL mg/dL (calc) - 56 - - -  Triglycerides <150 mg/dL - 171(H) - - -  Creatinine 0.44 - 1.00 mg/dL 1.26(H) 1.20(H) 1.18(H) 1.09(H) 1.08(H)   BP/Weight 01/10/2020 01/09/2020 11/28/2019 09/10/2019 07/18/2019 8/32/9191 07/21/598  Systolic BP 459 - 977 414 239 532 023  Diastolic BP 82 - 84 80 80 75 103  Wt. (Lbs) - 260 280 243 243 235 -  BMI - 40.72 43.85 38.06 38.06 36.81 -   Foot/eye exam completion dates Latest Ref Rng & Units 11/28/2019 07/18/2019  Eye Exam No Retinopathy - -  Foot exam Order - - -  Foot Form Completion - Done Done

## 2020-06-10 NOTE — Assessment & Plan Note (Signed)
DASH diet and commitment to daily physical activity for a minimum of 30 minutes discussed and encouraged, as a part of hypertension management. The importance of attaining a healthy weight is also discussed.  BP/Weight 01/10/2020 01/09/2020 11/28/2019 09/10/2019 07/18/2019 5/99/3570 02/21/7937  Systolic BP 030 - 092 330 076 226 333  Diastolic BP 82 - 84 80 80 75 103  Wt. (Lbs) - 260 280 243 243 235 -  BMI - 40.72 43.85 38.06 38.06 36.81 -

## 2020-06-10 NOTE — Progress Notes (Signed)
Virtual Visit via Telephone Note  I connected with [name]  And sister [name] who  Speaks for pt on 06/10/20 at  1:40 PM EDT by telephone and verified that I am speaking with the correct person using two identifiers.  Location: Patient: home Provider: office   I discussed the limitations, risks, security and privacy concerns of performing an evaluation and management service by telephone and the availability of in person appointments. I also discussed with the patient that there may be a patient responsible charge related to this service. The patient expressed understanding and agreed to proceed.   History of Present Illness: F/U chronic problems and address any new or current concerns. Review and update medications and allergies. Review recent lab and radiologic data . Update routine health maintainace. Markedly uncontrolled diabetes,s ister attributes this to different form of insulin and also to a lot of stress in the home as [name] daughter is very ill will cancer    Observations/Objective: There were no vitals taken for this visit. Good communication with no confusion and intact memory. Alert and oriented x 3 No signs of respiratory distress during speech    Assessment and Plan: Essential hypertension DASH diet and commitment to daily physical activity for a minimum of 30 minutes discussed and encouraged, as a part of hypertension management. The importance of attaining a healthy weight is also discussed.  BP/Weight 01/10/2020 01/09/2020 11/28/2019 09/10/2019 07/18/2019 9/51/8841 07/21/628  Systolic BP 160 - 109 323 557 322 025  Diastolic BP 82 - 84 80 80 75 103  Wt. (Lbs) - 260 280 243 243 235 -  BMI - 40.72 43.85 38.06 38.06 36.81 -       Migraine Controlled, no change in medication   Type 2 diabetes mellitus with neurological complications (H[facility]) Uncontrolled, increase insulin dose to 55 units twice daily and also test at least twice daily with 6 week f/u  call sooner if blood sugar is staying igh. Endo recommended but family wants to try to better manage diet and meds Ms. Faries is reminded of the importance of commitment to daily physical activity for 30 minutes or more, as able and the need to limit carbohydrate intake to 30 to 60 grams per meal to help with blood sugar control.   The need to take medication as prescribed, test blood sugar as directed, and to call between visits if there is a concern that blood sugar is uncontrolled is also discussed.   Ms. Weisner is reminded of the importance of daily foot exam, annual eye examination, and good blood sugar, blood pressure and cholesterol control.  Diabetic Labs Latest Ref Rng & Units 01/09/2020 11/28/2019 08/27/2019 05/22/2019 04/24/2019  HbA1c <5.7 % of total Hgb - 9.9(H) 8.5(H) - -  Microalbumin mg/dL - - - - -  Micro/Creat Ratio <30 mcg/mg creat - - - - -  Chol <200 mg/dL - 138 - - -  HDL > OR = 50 mg/dL - 57 - - -  Calc LDL mg/dL (calc) - 56 - - -  Triglycerides <150 mg/dL - 171(H) - - -  Creatinine 0.44 - 1.00 mg/dL 1.26(H) 1.20(H) 1.18(H) 1.09(H) 1.08(H)   BP/Weight 01/10/2020 01/09/2020 11/28/2019 09/10/2019 07/18/2019 06/11/621 08/20/2829  Systolic BP 517 - 616 073 710 626 948  Diastolic BP 82 - 84 80 80 75 103  Wt. (Lbs) - 260 280 243 243 235 -  BMI - 40.72 43.85 38.06 38.06 36.81 -   Foot/eye exam completion dates Latest Ref Rng &  Units 11/28/2019 07/18/2019  Eye Exam No Retinopathy - -  Foot exam Order - - -  Foot Form Completion - Done Done        Morbid obesity ([place])  Patient re-educated about  the importance of commitment to a  minimum of 150 minutes of exercise per week as able.  The importance of healthy food choices with portion control discussed, as well as eating regularly and within a 12 hour window most days. The need to choose "clean , green" food 50 to 75% of the time is discussed, as well as to make water the primary drink and set a goal of 64 ounces water daily.     Weight /BMI 01/09/2020 11/28/2019 09/10/2019  WEIGHT 260 lb 280 lb 243 lb  HEIGHT 5' 7" 5' 7" 5' 7"  BMI 40.72 kg/m2 43.85 kg/m2 38.06 kg/m2      Encounter for chronic pain management The patient's Controlled Substance registry is reviewed and compliance confirmed. Adequacy of  Pain control and level of function is assessed. Medication dosing is adjusted as deemed appropriate. Twelve weeks of medication is prescribed , with a follow up appointment between 11 to 12 weeks .     Follow Up Instructions:    I discussed the assessment and treatment plan with the patient. The patient was provided an opportunity to ask questions and all were answered. The patient agreed with the plan and demonstrated an understanding of the instructions.   The patient was advised to call back or seek an in-person evaluation if the symptoms worsen or if the condition fails to improve as anticipated.  I provided 25 minutes of non-face-to-face time during this encounter.   Tula Nakayama, MD

## 2020-06-10 NOTE — Assessment & Plan Note (Signed)
Controlled, no change in medication  

## 2020-06-10 NOTE — Patient Instructions (Addendum)
F/U on phone in 5 to 6 weeks, with blood sugar record twice daily Call earlier if blood sugar is staying high  Increase insulin to 55 units twice daily and reduce sweets and starches  Test and record at least two times daily. Before breakfast and at bedtime  Goal for fasting blood sugar ranges from 90  to 130  and 2 hours after any meal or at bedtime should be between 140 to 180.

## 2020-06-11 ENCOUNTER — Telehealth: Payer: Self-pay | Admitting: Family Medicine

## 2020-06-11 ENCOUNTER — Other Ambulatory Visit: Payer: Self-pay

## 2020-06-11 DIAGNOSIS — M159 Polyosteoarthritis, unspecified: Secondary | ICD-10-CM | POA: Diagnosis not present

## 2020-06-11 DIAGNOSIS — R296 Repeated falls: Secondary | ICD-10-CM | POA: Diagnosis not present

## 2020-06-11 DIAGNOSIS — N183 Chronic kidney disease, stage 3 unspecified: Secondary | ICD-10-CM | POA: Diagnosis not present

## 2020-06-11 DIAGNOSIS — F418 Other specified anxiety disorders: Secondary | ICD-10-CM | POA: Diagnosis not present

## 2020-06-11 DIAGNOSIS — E1165 Type 2 diabetes mellitus with hyperglycemia: Secondary | ICD-10-CM | POA: Diagnosis not present

## 2020-06-11 DIAGNOSIS — G8929 Other chronic pain: Secondary | ICD-10-CM | POA: Diagnosis not present

## 2020-06-11 DIAGNOSIS — E1149 Type 2 diabetes mellitus with other diabetic neurological complication: Secondary | ICD-10-CM | POA: Diagnosis not present

## 2020-06-11 DIAGNOSIS — M5441 Lumbago with sciatica, right side: Secondary | ICD-10-CM | POA: Diagnosis not present

## 2020-06-11 DIAGNOSIS — E1122 Type 2 diabetes mellitus with diabetic chronic kidney disease: Secondary | ICD-10-CM | POA: Diagnosis not present

## 2020-06-11 MED ORDER — NITROFURANTOIN MONOHYD MACRO 100 MG PO CAPS: 100.0000 mg | ORAL_CAPSULE | Freq: Two times a day (BID) | ORAL | 0 refills | Status: DC

## 2020-06-11 NOTE — Telephone Encounter (Signed)
Sent and pt aware  

## 2020-06-11 NOTE — Telephone Encounter (Signed)
Has a UTI, needs to start antibiotic today, nitrofurantoin sent to pharmacy. Ensure drinks 64 ounces water daily and empties often

## 2020-06-12 ENCOUNTER — Other Ambulatory Visit: Payer: Self-pay

## 2020-06-12 MED ORDER — INSULIN ASPART PROT & ASPART (70-30 MIX) 100 UNIT/ML PEN: 55.0000 [IU] | PEN_INJECTOR | Freq: Two times a day (BID) | SUBCUTANEOUS | 5 refills | Status: DC

## 2020-06-15 MED ORDER — OXYCODONE-ACETAMINOPHEN 10-325 MG PO TABS: ORAL_TABLET | ORAL | 0 refills | Status: DC

## 2020-06-15 MED ORDER — OXYCODONE-ACETAMINOPHEN 10-325 MG PO TABS: ORAL_TABLET | ORAL | 0 refills | Status: AC

## 2020-06-15 NOTE — Assessment & Plan Note (Signed)
  Patient re-educated about  the importance of commitment to a  minimum of 150 minutes of exercise per week as able.  The importance of healthy food choices with portion control discussed, as well as eating regularly and within a 12 hour window most days. The need to choose "clean , green" food 50 to 75% of the time is discussed, as well as to make water the primary drink and set a goal of 64 ounces water daily.    Weight /BMI 01/09/2020 11/28/2019 09/10/2019  WEIGHT 260 lb 280 lb 243 lb  HEIGHT 5\' 7"  5\' 7"  5\' 7"   BMI 40.72 kg/m2 43.85 kg/m2 38.06 kg/m2

## 2020-06-15 NOTE — Assessment & Plan Note (Signed)
The patient's Controlled Substance registry is reviewed and compliance confirmed. Adequacy of  Pain control and level of function is assessed. Medication dosing is adjusted as deemed appropriate. Twelve weeks of medication is prescribed , with a follow up appointment between 11 to 12 weeks .  

## 2020-06-18 DIAGNOSIS — M159 Polyosteoarthritis, unspecified: Secondary | ICD-10-CM | POA: Diagnosis not present

## 2020-06-18 DIAGNOSIS — N183 Chronic kidney disease, stage 3 unspecified: Secondary | ICD-10-CM | POA: Diagnosis not present

## 2020-06-18 DIAGNOSIS — R296 Repeated falls: Secondary | ICD-10-CM | POA: Diagnosis not present

## 2020-06-18 DIAGNOSIS — E1122 Type 2 diabetes mellitus with diabetic chronic kidney disease: Secondary | ICD-10-CM | POA: Diagnosis not present

## 2020-06-18 DIAGNOSIS — M5441 Lumbago with sciatica, right side: Secondary | ICD-10-CM | POA: Diagnosis not present

## 2020-06-18 DIAGNOSIS — E1149 Type 2 diabetes mellitus with other diabetic neurological complication: Secondary | ICD-10-CM | POA: Diagnosis not present

## 2020-06-18 DIAGNOSIS — G8929 Other chronic pain: Secondary | ICD-10-CM | POA: Diagnosis not present

## 2020-06-18 DIAGNOSIS — E1165 Type 2 diabetes mellitus with hyperglycemia: Secondary | ICD-10-CM | POA: Diagnosis not present

## 2020-06-18 DIAGNOSIS — F418 Other specified anxiety disorders: Secondary | ICD-10-CM | POA: Diagnosis not present

## 2020-06-25 DIAGNOSIS — J42 Unspecified chronic bronchitis: Secondary | ICD-10-CM | POA: Diagnosis not present

## 2020-06-25 DIAGNOSIS — R0902 Hypoxemia: Secondary | ICD-10-CM | POA: Diagnosis not present

## 2020-07-04 ENCOUNTER — Other Ambulatory Visit: Payer: Self-pay | Admitting: Family Medicine

## 2020-07-21 ENCOUNTER — Ambulatory Visit: Payer: Medicare HMO | Admitting: Family Medicine

## 2020-07-26 DIAGNOSIS — R0902 Hypoxemia: Secondary | ICD-10-CM | POA: Diagnosis not present

## 2020-07-26 DIAGNOSIS — J42 Unspecified chronic bronchitis: Secondary | ICD-10-CM | POA: Diagnosis not present

## 2020-08-06 ENCOUNTER — Other Ambulatory Visit: Payer: Self-pay | Admitting: Family Medicine

## 2020-08-14 ENCOUNTER — Encounter: Payer: Self-pay | Admitting: Family Medicine

## 2020-08-14 ENCOUNTER — Ambulatory Visit (INDEPENDENT_AMBULATORY_CARE_PROVIDER_SITE_OTHER): Payer: Medicare HMO | Admitting: Family Medicine

## 2020-08-14 ENCOUNTER — Other Ambulatory Visit: Payer: Self-pay

## 2020-08-14 VITALS — BP 139/79 | HR 91 | Temp 97.6°F

## 2020-08-14 DIAGNOSIS — R630 Anorexia: Secondary | ICD-10-CM | POA: Diagnosis not present

## 2020-08-14 DIAGNOSIS — R300 Dysuria: Secondary | ICD-10-CM

## 2020-08-14 DIAGNOSIS — E1149 Type 2 diabetes mellitus with other diabetic neurological complication: Secondary | ICD-10-CM

## 2020-08-14 DIAGNOSIS — G8929 Other chronic pain: Secondary | ICD-10-CM | POA: Diagnosis not present

## 2020-08-14 DIAGNOSIS — M5441 Lumbago with sciatica, right side: Secondary | ICD-10-CM

## 2020-08-14 DIAGNOSIS — I10 Essential (primary) hypertension: Secondary | ICD-10-CM | POA: Diagnosis not present

## 2020-08-14 DIAGNOSIS — R0902 Hypoxemia: Secondary | ICD-10-CM | POA: Diagnosis not present

## 2020-08-14 DIAGNOSIS — G894 Chronic pain syndrome: Secondary | ICD-10-CM

## 2020-08-14 DIAGNOSIS — E785 Hyperlipidemia, unspecified: Secondary | ICD-10-CM

## 2020-08-14 DIAGNOSIS — M159 Polyosteoarthritis, unspecified: Secondary | ICD-10-CM | POA: Diagnosis not present

## 2020-08-14 DIAGNOSIS — N1832 Chronic kidney disease, stage 3b: Secondary | ICD-10-CM

## 2020-08-14 MED ORDER — CLOTRIMAZOLE-BETAMETHASONE 1-0.05 % EX CREA: 1.0000 "application " | TOPICAL_CREAM | Freq: Two times a day (BID) | CUTANEOUS | 3 refills | Status: DC

## 2020-08-14 MED ORDER — FLUOXETINE HCL (PMDD) 10 MG PO TABS: 10.0000 mg | ORAL_TABLET | Freq: Every day | ORAL | Status: DC

## 2020-08-14 MED ORDER — NYSTATIN 100000 UNIT/GM EX POWD: Freq: Every day | CUTANEOUS | 3 refills | Status: DC | PRN

## 2020-08-14 MED ORDER — ALBUTEROL SULFATE HFA 108 (90 BASE) MCG/ACT IN AERS: 2.0000 | INHALATION_SPRAY | Freq: Four times a day (QID) | RESPIRATORY_TRACT | 3 refills | Status: DC | PRN

## 2020-08-14 MED ORDER — UNABLE TO FIND: 0 refills | Status: AC

## 2020-08-14 NOTE — Progress Notes (Signed)
Virtual Visit via Telephone Note  I connected with [name] and her sister [name] on 08/14/20 at  2:20 PM EDT by telephone and verified that I am speaking with the correct person using two identifiers.  Location: Patient: home Provider: office   I discussed the limitations, risks, security and privacy concerns of performing an evaluation and management service by telephone and the availability of in person appointments. I also discussed with the patient that there may be a patient responsible charge related to this service. The patient expressed understanding and agreed to proceed.   History of Present Illness: Blood sugars improved, needs labs has log in office  1 week h/o burning with urination, no fever or chills Increased difficulty getting around, seeking labs an in home visits, will follow through as mobility is markedly limited   Observations/Objec BP 139/79   Pulse 91   Temp 97.6 F (36.4 C)   Good communication Alert and oriented x 3 No signs of respiratory distress during speech  Assessment and Plan:  Essential hypertension Controlled, no change in medication   Encounter for chronic pain management The patient's Controlled Substance registry is reviewed and compliance confirmed. Adequacy of  Pain control and level of function is assessed. Medication dosing is adjusted as deemed appropriate. Twelve weeks of medication is prescribed , with a follow up appointment between 11 to 12 weeks .   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 [name] is reminded of the importance of commitment to daily physical activity for 30 minutes or more, as able and the need to limit carbohydrate intake to 30 to 60 grams per meal to help with blood sugar control.   The need to take medication as prescribed, test blood sugar as directed, and to call between visits if there is a concern that blood  sugar is uncontrolled is also discussed.   [name] is reminded of the importance of daily foot exam, annual eye examination, and good blood sugar, blood pressure and cholesterol control.  Diabetic Labs Latest Ref Rng & Units 01/09/2020 11/28/2019 08/27/2019 05/22/2019 04/24/2019  HbA1c <5.7 % of total Hgb - 9.9(H) 8.5(H) - -  Microalbumin mg/dL - - - - -  Micro/Creat Ratio <30 mcg/mg creat - - - - -  Chol <200 mg/dL - 138 - - -  HDL > OR = 50 mg/dL - 57 - - -  Calc LDL mg/dL (calc) - 56 - - -  Triglycerides <150 mg/dL - 171(H) - - -  Creatinine 0.44 - 1.00 mg/dL 1.26(H) 1.20(H) 1.18(H) 1.09(H) 1.08(H)   BP/Weight 08/14/2020 01/10/2020 01/09/2020 11/28/2019 09/10/2019 07/18/2019 1/82/9937  Systolic BP 169 678 - 938 101 751 025  Diastolic BP 79 82 - 84 80 80 75  Wt. (Lbs) - - 260 280 243 243 235  BMI - - 40.72 43.85 38.06 38.06 36.81   Foot/eye exam completion dates Latest Ref Rng & Units 11/28/2019 07/18/2019  Eye Exam No Retinopathy - -  Foot exam Order - - -  Foot Form Completion - Done Done        Dyslipidemia Hyperlipidemia:Low fat diet discussed and encouraged.   Lipid Panel  Lab Results  Component Value Date   CHOL 138 11/28/2019   HDL 57 11/28/2019   LDLCALC 56 11/28/2019   TRIG 171 (H) 11/28/2019   CHOLHDL 2.4 11/28/2019  Updated lab needed at/ before next visit.      Morbid obesity ([place])  Patient re-educated about  the importance of commitment to a  minimum of 150 minutes of exercise per week as able.  The importance of healthy food choices with portion control discussed, as well as eating regularly and within a 12 hour window most days. The need to choose "clean , green" food 50 to 75% of the time is discussed, as well as to make water the primary drink and set a goal of 64 ounces water daily.    Weight /BMI 01/09/2020 11/28/2019 09/10/2019  WEIGHT 260 lb 280 lb 243 lb  HEIGHT 5\' 7"  5\' 7"  5\' 7"   BMI 40.72 kg/m2 43.85 kg/m2 38.06 kg/m2      Right-sided low  back pain with right-sided sciatica Increasingly debilitated by generalized arthritis with nerve compression Refer h/h for assistance re drawing labs and monthly in home checks/ as needed Home safety reviewed may need ongoing PT  Follow Up Instructions:    I discussed the assessment and treatment plan with the patient. The patient was provided an opportunity to ask questions and all were answered. The patient agreed with the plan and demonstrated an understanding of the instructions.   The patient was advised to call back or seek an in-person evaluation if the symptoms worsen or if the condition fails to improve as anticipated.  I provided 33 minutes of non-face-to-face time during this encounter.   Tula Nakayama, MD

## 2020-08-14 NOTE — Patient Instructions (Addendum)
F/U in 11 weeks , video visit  , re chronic conditions, call if you need me sooner  Please collect urine cup and return it before mid day tomorrow, needs CCUA and microalb   We will get back in touch re best option for lab work to be drawn at home, you need fasting lipid, cmp and eGFR, hBA1c, CBC  Be careful not to fall  Thanks for choosing [facility], we consider it a privelige to serve you.  

## 2020-08-19 DIAGNOSIS — H524 Presbyopia: Secondary | ICD-10-CM | POA: Diagnosis not present

## 2020-08-19 DIAGNOSIS — Z0100 Encounter for examination of eyes and vision without abnormal findings: Secondary | ICD-10-CM | POA: Diagnosis not present

## 2020-08-19 LAB — HM DIABETES EYE EXAM

## 2020-08-24 ENCOUNTER — Telehealth: Payer: Self-pay | Admitting: Family Medicine

## 2020-08-24 MED ORDER — OXYCODONE-ACETAMINOPHEN 10-325 MG PO TABS: ORAL_TABLET | ORAL | 0 refills | Status: DC

## 2020-08-24 NOTE — Assessment & Plan Note (Signed)
1 week history need to test urine for infection

## 2020-08-24 NOTE — Assessment & Plan Note (Signed)
Controlled, no change in medication  

## 2020-08-24 NOTE — Assessment & Plan Note (Signed)
  Patient re-educated about  the importance of commitment to a  minimum of 150 minutes of exercise per week as able.  The importance of healthy food choices with portion control discussed, as well as eating regularly and within a 12 hour window most days. The need to choose "clean , green" food 50 to 75% of the time is discussed, as well as to make water the primary drink and set a goal of 64 ounces water daily.    Weight /BMI 01/09/2020 11/28/2019 09/10/2019  WEIGHT 260 lb 280 lb 243 lb  HEIGHT 5\' 7"  5\' 7"  5\' 7"   BMI 40.72 kg/m2 43.85 kg/m2 38.06 kg/m2

## 2020-08-24 NOTE — Assessment & Plan Note (Signed)
Uncontrolled Updated lab needed at/ before next visit. [name] is reminded of the importance of commitment to daily physical activity for 30 minutes or more, as able and the need to limit carbohydrate intake to 30 to 60 grams per meal to help with blood sugar control.   The need to take medication as prescribed, test blood sugar as directed, and to call between visits if there is a concern that blood sugar is uncontrolled is also discussed.   [name] is reminded of the importance of daily foot exam, annual eye examination, and good blood sugar, blood pressure and cholesterol control.  Diabetic Labs Latest Ref Rng & Units 01/09/2020 11/28/2019 08/27/2019 05/22/2019 04/24/2019  HbA1c <5.7 % of total Hgb - 9.9(H) 8.5(H) - -  Microalbumin mg/dL - - - - -  Micro/Creat Ratio <30 mcg/mg creat - - - - -  Chol <200 mg/dL - 138 - - -  HDL > OR = 50 mg/dL - 57 - - -  Calc LDL mg/dL (calc) - 56 - - -  Triglycerides <150 mg/dL - 171(H) - - -  Creatinine 0.44 - 1.00 mg/dL 1.26(H) 1.20(H) 1.18(H) 1.09(H) 1.08(H)   BP/Weight 08/14/2020 01/10/2020 01/09/2020 11/28/2019 09/10/2019 07/18/2019 6/60/6301  Systolic BP 601 093 - 235 573 220 254  Diastolic BP 79 82 - 84 80 80 75  Wt. (Lbs) - - 260 280 243 243 235  BMI - - 40.72 43.85 38.06 38.06 36.81   Foot/eye exam completion dates Latest Ref Rng & Units 11/28/2019 07/18/2019  Eye Exam No Retinopathy - -  Foot exam Order - - -  Foot Form Completion - Done Done

## 2020-08-24 NOTE — Assessment & Plan Note (Signed)
Hyperlipidemia:Low fat diet discussed and encouraged.   Lipid Panel  Lab Results  Component Value Date   CHOL 138 11/28/2019   HDL 57 11/28/2019   LDLCALC 56 11/28/2019   TRIG 171 (H) 11/28/2019   CHOLHDL 2.4 11/28/2019  Updated lab needed at/ before next visit.

## 2020-08-24 NOTE — Assessment & Plan Note (Signed)
The patient's Controlled Substance registry is reviewed and compliance confirmed. Adequacy of  Pain control and level of function is assessed. Medication dosing is adjusted as deemed appropriate. Twelve weeks of medication is prescribed , with a follow up appointment between 11 to 12 weeks .  

## 2020-08-24 NOTE — Telephone Encounter (Signed)
May we f/u from 6/30 visit, please send responses as available Need to arrange h/ to  draw labs as pt's mobility is markedly reduced.She may need to get in home PT to qualify for service, community case worker may be able to help with this Please order for draw asap  fasting lipid, cmp and EGFR, HBA1C , vit D, TSH, microalb, CCUA and reflex c/s  She has not had labs since 12/2019. Please let me know if I can help with this

## 2020-08-24 NOTE — Assessment & Plan Note (Signed)
Increasingly debilitated by generalized arthritis with nerve compression Refer h/h for assistance re drawing labs and monthly in home checks/ as needed Home safety reviewed may need ongoing PT

## 2020-08-25 DIAGNOSIS — J42 Unspecified chronic bronchitis: Secondary | ICD-10-CM | POA: Diagnosis not present

## 2020-08-25 DIAGNOSIS — R0902 Hypoxemia: Secondary | ICD-10-CM | POA: Diagnosis not present

## 2020-08-25 NOTE — Telephone Encounter (Signed)
I spoke with sister [name]. Insurance has denied our initial [facility] request due to pt only having a phone visit. [name] has agreed to have video visit which [place] counts as face-to-face. Scheduled for tomorrow. After that I will resubmit home health orders and they will be able to send nurse out to draw the labs. Pt's son has picked up a cup for the UA/CS

## 2020-08-26 ENCOUNTER — Telehealth (INDEPENDENT_AMBULATORY_CARE_PROVIDER_SITE_OTHER): Payer: Medicare HMO | Admitting: Family Medicine

## 2020-08-26 ENCOUNTER — Other Ambulatory Visit: Payer: Self-pay

## 2020-08-26 DIAGNOSIS — Z742 Need for assistance at home and no other household member able to render care: Secondary | ICD-10-CM | POA: Diagnosis not present

## 2020-08-26 DIAGNOSIS — R300 Dysuria: Secondary | ICD-10-CM | POA: Diagnosis not present

## 2020-08-26 DIAGNOSIS — M159 Polyosteoarthritis, unspecified: Secondary | ICD-10-CM | POA: Diagnosis not present

## 2020-08-26 NOTE — Progress Notes (Signed)
Virtual Visit via videote  I connected with [name] on 08/26/20 at  2:20 PM EDT by video  Location: Patient: home Provider: office   I discussed the limitations, risks, security and privacy concerns of performing an evaluation and management service by telephone and the availability of in person appointments. I also discussed with the patient that there may be a patient responsible charge related to this service. The patient expressed understanding and agreed to proceed.   History of Present Illness: Video visit set up for due to reduced mobility which is progressing from severe generalized osteoarthritis. [name] states she hardly gets out of her bed as she is afraid to fall. She c/o back, knee and shoulder pain with joint stiffness and swelling,reports worsening. Unable to ambulate without assistance, unable to prepare meals, needs assistance wit toileting. Increasingly difficult to leave her home because of this debility, and now needs and requests in home PT/oT to help with joint stiffness and work on mobility, also unable to leave home for office visits and needs labs drawn as well as h/h visits once or twice monthly for medication checks and in home evaluation C/o 1 week h/o burning with urination, no fever or chills , has had UTI's in the past  Appetite is  good and bowels are moving regularly No skin breakdown currently Observations/Objective: There were no vitals taken for this visit. Good communication with no confusion and intact memory. Alert and oriented x 3 No signs of respiratory distress during speech   Assessment and Plan: Generalized osteoarthritis  Progressive and increasingly disabling pain stffness and reduced mobility. Unable to make office visits, needs in home pT/OT and nurse visits , referred to h/H  Dysuria Symptomatic with abn UA, treat presumptively and f/u on c/s  Need for home health care increasing debility and lack of safe mobility due to severe  generalized osteoarthritis make it necessary for home health visits and care as [name] also has multiple medical illness which require monitoring including labs . She is unable to safely leave her home withour an ambulance at this time.   Follow Up Instructions:    I discussed the assessment and treatment plan with the patient. The patient was provided an opportunity to ask questions and all were answered. The patient agreed with the plan and demonstrated an understanding of the instructions.   The patient was advised to call back or seek an in-person evaluation if the symptoms worsen or if the condition fails to improve as anticipated.  I provided 14  minutes of non-face-to-face time during this encounter.   Tula Nakayama, MD

## 2020-08-26 NOTE — Patient Instructions (Addendum)
Follow-up as before call if you need me sooner.    Based on today's visit because of your poor mobility and chronic uncontrolled pain you do qualify for home health services and physical therapy as well as home nurse visits for monitoring chronic health conditions as I will outline.  Please get labs ordered as soon as possible  Your urine specimen suggest that you may have a urinary tract infection and because you are symptomatic I am prescribing 3 days of medication and we will send the urine for further testing.  Please continue to be careful not to fall.  Based on recommendations from physical therapy and measurements and will try to prescribe both a back brace and brace for your right knee however this will come after your physical therapy evaluation at home.  Thanks for choosing [facility], we consider it a privelige to serve you.

## 2020-08-27 ENCOUNTER — Encounter: Payer: Self-pay | Admitting: Family Medicine

## 2020-08-27 ENCOUNTER — Other Ambulatory Visit: Payer: Self-pay

## 2020-08-27 DIAGNOSIS — N1832 Chronic kidney disease, stage 3b: Secondary | ICD-10-CM

## 2020-08-27 DIAGNOSIS — Z742 Need for assistance at home and no other household member able to render care: Secondary | ICD-10-CM

## 2020-08-27 MED ORDER — UNABLE TO FIND: 0 refills | Status: AC

## 2020-08-27 NOTE — Assessment & Plan Note (Signed)
Progressive and increasingly disabling pain stffness and reduced mobility. Unable to make office visits, needs in home pT/OT and nurse visits , referred to [facility]

## 2020-08-27 NOTE — Assessment & Plan Note (Signed)
Symptomatic with abn UA, treat presumptively and f/u on c/s

## 2020-08-27 NOTE — Assessment & Plan Note (Signed)
increasing debility and lack of safe mobility due to severe generalized osteoarthritis make it necessary for home health visits and care as [name] also has multiple medical illness which require monitoring including labs . She is unable to safely leave her home withour an ambulance at this time.

## 2020-08-31 LAB — URINE CULTURE

## 2020-09-01 ENCOUNTER — Other Ambulatory Visit: Payer: Self-pay | Admitting: Family Medicine

## 2020-09-01 MED ORDER — CIPROFLOXACIN HCL 500 MG PO TABS: 500.0000 mg | ORAL_TABLET | Freq: Two times a day (BID) | ORAL | 0 refills | Status: AC

## 2020-09-02 ENCOUNTER — Other Ambulatory Visit: Payer: Self-pay | Admitting: Family Medicine

## 2020-09-10 ENCOUNTER — Other Ambulatory Visit: Payer: Self-pay

## 2020-09-10 ENCOUNTER — Ambulatory Visit (INDEPENDENT_AMBULATORY_CARE_PROVIDER_SITE_OTHER): Payer: Medicare HMO | Admitting: *Deleted

## 2020-09-10 DIAGNOSIS — Z0000 Encounter for general adult medical examination without abnormal findings: Secondary | ICD-10-CM | POA: Diagnosis not present

## 2020-09-10 DIAGNOSIS — E119 Type 2 diabetes mellitus without complications: Secondary | ICD-10-CM | POA: Diagnosis not present

## 2020-09-10 NOTE — Patient Instructions (Signed)
[name] , Thank you for taking time to come for your Medicare Wellness Visit. I appreciate your ongoing commitment to your health goals. Please review the following plan we discussed and let me know if I can assist you in the future.   Screening recommendations/referrals: Mammogram: Due now will call us if wants to set up Bone Density: Due now will call us if wants to set up Recommended yearly ophthalmology/optometry visit for glaucoma screening and checkup Recommended yearly dental visit for hygiene and checkup  Vaccinations: Influenza vaccine: completed due 09-15-20 Pneumococcal vaccine: completed Tdap vaccine: Due now  Shingles vaccine: Due now     Advanced directives: Information provided  Conditions/risks identified: Hypertension, Diabetes  Next appointment: 1 year    Preventive Care 37 Years and Older, Female Preventive care refers to lifestyle choices and visits with your health care provider that can promote health and wellness. What does preventive care include? A yearly physical exam. This is also called an annual well check. Dental exams once or twice a year. Routine eye exams. Ask your health care provider how often you should have your eyes checked. Personal lifestyle choices, including: Daily care of your teeth and gums. Regular physical activity. Eating a healthy diet. Avoiding tobacco and drug use. Limiting alcohol use. Practicing safe sex. Taking low-dose aspirin every day. Taking vitamin and mineral supplements as recommended by your health care provider. What happens during an annual well check? The services and screenings done by your health care provider during your annual well check will depend on your age, overall health, lifestyle risk factors, and family history of disease. Counseling  Your health care provider may ask you questions about your: Alcohol use. Tobacco use. Drug use. Emotional well-being. Home and relationship well-being. Sexual  activity. Eating habits. History of falls. Memory and ability to understand (cognition). Work and work Statistician. Reproductive health. Screening  You may have the following tests or measurements: Height, weight, and BMI. Blood pressure. Lipid and cholesterol levels. These may be checked every 5 years, or more frequently if you are over 36 years old. Skin check. Lung cancer screening. You may have this screening every year starting at age 70 if you have a 30-pack-year history of smoking and currently smoke or have quit within the past 15 years. Fecal occult blood test (FOBT) of the stool. You may have this test every year starting at age 2. Flexible sigmoidoscopy or colonoscopy. You may have a sigmoidoscopy every 5 years or a colonoscopy every 10 years starting at age 65. Hepatitis C blood test. Hepatitis B blood test. Sexually transmitted disease (STD) testing. Diabetes screening. This is done by checking your blood sugar (glucose) after you have not eaten for a while (fasting). You may have this done every 1-3 years. Bone density scan. This is done to screen for osteoporosis. You may have this done starting at age 72. Mammogram. This may be done every 1-2 years. Talk to your health care provider about how often you should have regular mammograms. Talk with your health care provider about your test results, treatment options, and if necessary, the need for more tests. Vaccines  Your health care provider may recommend certain vaccines, such as: Influenza vaccine. This is recommended every year. Tetanus, diphtheria, and acellular pertussis (Tdap, Td) vaccine. You may need a Td booster every 10 years. Zoster vaccine. You may need this after age 28. Pneumococcal 13-valent conjugate (PCV13) vaccine. One dose is recommended after age 81. Pneumococcal polysaccharide (PPSV23) vaccine. One dose is recommended  after age 43. Talk to your health care provider about which screenings and vaccines  you need and how often you need them. This information is not intended to replace advice given to you by your health care provider. Make sure you discuss any questions you have with your health care provider. Document Released: 02/28/2015 Document Revised: 10/22/2015 Document Reviewed: 12/03/2014 Elsevier Interactive Patient Education  2017 [place] Prevention in the Home Falls can cause injuries. They can happen to people of all ages. There are many things you can do to make your home safe and to help prevent falls. What can I do on the outside of my home? Regularly fix the edges of walkways and driveways and fix any cracks. Remove anything that might make you trip as you walk through a door, such as a raised step or threshold. Trim any bushes or trees on the path to your home. Use bright outdoor lighting. Clear any walking paths of anything that might make someone trip, such as rocks or tools. Regularly check to see if handrails are loose or broken. Make sure that both sides of any steps have handrails. Any raised decks and porches should have guardrails on the edges. Have any leaves, snow, or ice cleared regularly. Use sand or salt on walking paths during winter. Clean up any spills in your garage right away. This includes oil or grease spills. What can I do in the bathroom? Use night lights. Install grab bars by the toilet and in the tub and shower. Do not use towel bars as grab bars. Use non-skid mats or decals in the tub or shower. If you need to sit down in the shower, use a plastic, non-slip stool. Keep the floor dry. Clean up any water that spills on the floor as soon as it happens. Remove soap buildup in the tub or shower regularly. Attach bath mats securely with double-sided non-slip rug tape. Do not have throw rugs and other things on the floor that can make you trip. What can I do in the bedroom? Use night lights. Make sure that you have a light by your bed that  is easy to reach. Do not use any sheets or blankets that are too big for your bed. They should not hang down onto the floor. Have a firm chair that has side arms. You can use this for support while you get dressed. Do not have throw rugs and other things on the floor that can make you trip. What can I do in the kitchen? Clean up any spills right away. Avoid walking on wet floors. Keep items that you use a lot in easy-to-reach places. If you need to reach something above you, use a strong step stool that has a grab bar. Keep electrical cords out of the way. Do not use floor polish or wax that makes floors slippery. If you must use wax, use non-skid floor wax. Do not have throw rugs and other things on the floor that can make you trip. What can I do with my stairs? Do not leave any items on the stairs. Make sure that there are handrails on both sides of the stairs and use them. Fix handrails that are broken or loose. Make sure that handrails are as long as the stairways. Check any carpeting to make sure that it is firmly attached to the stairs. Fix any carpet that is loose or worn. Avoid having throw rugs at the top or bottom of the stairs. If you  do have throw rugs, attach them to the floor with carpet tape. Make sure that you have a light switch at the top of the stairs and the bottom of the stairs. If you do not have them, ask someone to add them for you. What else can I do to help prevent falls? Wear shoes that: Do not have high heels. Have rubber bottoms. Are comfortable and fit you well. Are closed at the toe. Do not wear sandals. If you use a stepladder: Make sure that it is fully opened. Do not climb a closed stepladder. Make sure that both sides of the stepladder are locked into place. Ask someone to hold it for you, if possible. Clearly mark and make sure that you can see: Any grab bars or handrails. First and last steps. Where the edge of each step is. Use tools that help you  move around (mobility aids) if they are needed. These include: Canes. Walkers. Scooters. Crutches. Turn on the lights when you go into a dark area. Replace any light bulbs as soon as they burn out. Set up your furniture so you have a clear path. Avoid moving your furniture around. If any of your floors are uneven, fix them. If there are any pets around you, be aware of where they are. Review your medicines with your doctor. Some medicines can make you feel dizzy. This can increase your chance of falling. Ask your doctor what other things that you can do to help prevent falls. This information is not intended to replace advice given to you by your health care provider. Make sure you discuss any questions you have with your health care provider. Document Released: 11/28/2008 Document Revised: 07/10/2015 Document Reviewed: 03/08/2014 Elsevier Interactive Patient Education  2017 [employer].

## 2020-09-10 NOTE — Progress Notes (Addendum)
Subjective:   [name] is a 80 y.o. female who presents for Medicare Annual (Subsequent) preventive examination.  Review of Systems           Objective:    There were no vitals filed for this visit. There is no height or weight on file to calculate BMI.  Advanced Directives 01/09/2020 04/20/2019 04/20/2019 11/11/2018 11/10/2018 03/21/2018 03/17/2018  Does Patient Have a Medical Advance Directive? Unable to assess, patient is non-responsive or altered mental status No No Yes Yes Yes Yes  Type of Advance Directive - - - Healthcare Power of [place]  Does patient want to make changes to medical advance directive? - - - No - Patient declined No - Patient declined No - Patient declined No - Patient declined  Copy of [place] in Chart? - - - - - No - copy requested No - copy requested  Would patient like information on creating a medical advance directive? - No - Patient declined - - - - -  Pre-existing out of facility DNR order (yellow form or pink MOST form) - - - - - - -    Current Medications (verified) Outpatient Encounter Medications as of 09/10/2020  Medication Sig   NOVOLIN 70/30 (70-30) 100 UNIT/ML injection INJECT 55 UNITS S.Q. TWICE DAILY WITH AMEAL.   acetaminophen (TYLENOL) 500 MG tablet Take one tablet two times daily for chronic pain (Patient taking differently: Take 500 mg by mouth in the morning and at bedtime. for chronic pain)   albuterol (VENTOLIN HFA) 108 (90 Base) MCG/ACT inhaler Inhale 2 puffs into the lungs every 6 (six) hours as needed for wheezing or shortness of breath.   amLODipine (NORVASC) 10 MG tablet Take 1 tablet (10 mg total) by mouth daily.   atorvastatin (LIPITOR) 40 MG tablet Take 1 tablet (40 mg total) by mouth daily.   busPIRone (BUSPAR) 7.5 MG tablet Take 1 tablet (7.5 mg total) by mouth 3 (three) times daily.   cloNIDine (CATAPRES) 0.2 MG tablet  Take 1 tablet (0.2 mg total) by mouth at bedtime.   clotrimazole-betamethasone (LOTRISONE) cream Apply 1 application topically 2 (two) times daily. D/c vusion ointment   COMBIGAN 0.2-0.5 % ophthalmic solution Place 1 drop into both eyes 2 (two) times daily.   docusate sodium (COLACE) 100 MG capsule Take 1 capsule (100 mg total) by mouth daily as needed for mild constipation. (Patient taking differently: Take 100 mg by mouth daily.)   donepezil (ARICEPT) 10 MG tablet Take 1 tablet (10 mg total) by mouth at bedtime.   Fluoxetine HCl, PMDD, 10 MG TABS Take 1 tablet (10 mg total) by mouth daily.   glucose blood (ACCU-CHEK AVIVA PLUS) test strip USE TO TEST BLOOD SUGAR TWICE DAILY.   hydrOXYzine (VISTARIL) 25 MG capsule TAKE (1) CAPSULE BY MOUTH THREE TIMES DAILY   imipramine (TOFRANIL) 50 MG tablet TAKE 2 TABLETS BY MOUTH AT BEDTIME   insulin aspart protamine - aspart (NOVOLOG 70/30 MIX) (70-30) 100 UNIT/ML FlexPen Inject 0.55 mLs (55 Units total) into the skin 2 (two) times daily.   Insulin Syringe-Needle U-100 32G X 5/16" 1 ML MISC 1 each by Does not apply route in the morning and at bedtime. Use to inject insulin twice daily dx e11.65   meclizine (ANTIVERT) 25 MG tablet Take 1 tablet (25 mg total) by mouth 3 (three) times daily as needed for dizziness.   montelukast (SINGULAIR) 10 MG  tablet TAKE ONE TABLET BY MOUTH AT BEDTIME.   nitrofurantoin, macrocrystal-monohydrate, (MACROBID) 100 MG capsule Take 1 capsule (100 mg total) by mouth 2 (two) times daily.   nystatin (MYCOSTATIN/NYSTOP) powder Apply topically daily as needed (for rash under breasts).   olopatadine (PATANOL) 0.1 % ophthalmic solution Place 1 drop into both eyes 2 (two) times daily.   omeprazole (PRILOSEC) 40 MG capsule TAKE (1) CAPSULE BY MOUTH TWICE DAILY.   oxyCODONE-acetaminophen (PERCOCET) 10-325 MG tablet Take one tablet by mouth two times daily for back pain   [START ON 09/21/2020] oxyCODONE-acetaminophen (PERCOCET) 10-325 MG  tablet Take one tablet by mouth two times daily for back pain   [START ON 10/21/2020] oxyCODONE-acetaminophen (PERCOCET) 10-325 MG tablet Take one tablet by mouth two time daily for back pain   PRADAXA 150 MG CAPS capsule TAKE (1) CAPSULE BY MOUTH EVERY TWELVE HOURS.   SUMAtriptan (IMITREX) 25 MG tablet TAKE 1 TABLET BY MOUTH AS NEEDED FOR MIGRAINE. MAY REPEAT IN 2 HOURS IF HEADACHE PERSISTS OR RECURS.   topiramate (TOPAMAX) 100 MG tablet TAKE 1 TABLET BY MOUTH TWICE DAILY   UNABLE TO FIND Standard wheelchair with elevating leg rests DX M25.561, M54.41   UNABLE TO FIND Skilled Nursing to eval and treat as indicated. Will need routine lab work and vital sign assessments.   UNABLE TO [place] to evaluate and treat as indicated. Dx: Z74.2   Facility-Administered Encounter Medications as of 09/10/2020  Medication   fentaNYL (SUBLIMAZE) injection 25-50 mcg    Allergies (verified) Penicillins, Fentanyl, Sulfonamide derivatives, Sulfur, and Codeine   History: Past Medical History:  Diagnosis Date   Anemia, iron deficiency 2012   Evaluated by Dr. Oneida Alar; H&H of 9.3/30.8 with [place] in 10/2010; 3/3 positive Hemoccult cards in 07/2011   Anxiety    was on Prozac for a couple of weeks   Atrial fibrillation ([place])    takes Coumadin daily   Bruises easily    takes Coumadin   Chronic anticoagulation 07/21/2010   Chronic back pain    related to knee pain   Degenerative joint disease    Knees   Dementia ([place])    Depression    Diabetes mellitus    takes Metformin daily   Gastroesophageal reflux disease    takes Omeprazole daily   Glaucoma    Glaucoma    uses eye drops at night   History of blood transfusion 1969   History of gout    was on medication but taken off of several months ago   Hx of colonic polyps    Hx of migraines    last one about a yr ago;takes Topamax daily   Hyperlipidemia    takes Pravastatin daily   Hypertension    takes Hyzaar daily and Propranolol    Insomnia     takes Restoril prn   Joint pain    Joint swelling    Memory loss    takes donepezil   Migraine    Migraine    Nocturia    Overweight(278.02)    Pneumonia 1969   hx of   Pulmonary embolism [facility]) May 2012   acute presentation, bilateral PE   Skin spots-aging    Urinary frequency    Past Surgical History:  Procedure Laterality Date   BIOPSY  04/22/2019   Procedure: BIOPSY;  Surgeon: Daneil Dolin, MD;  [facility];  Service: Endoscopy;;  duodenum   CATARACT EXTRACTION W/PHACO Right 04/11/2012   Procedure: CATARACT EXTRACTION  PHACO AND INTRAOCULAR LENS PLACEMENT (IOC);  Surgeon: Elta Guadeloupe T. Gershon Crane, MD;  Location: [facility];  Service: Ophthalmology;  Laterality: Right;  CDE=9.61   CATARACT EXTRACTION W/PHACO Left 12/26/2012   Procedure: CATARACT EXTRACTION PHACO AND INTRAOCULAR LENS PLACEMENT (IOC);  Surgeon: Elta Guadeloupe T. Gershon Crane, MD;  Location: [facility];  Service: Ophthalmology;  Laterality: Left;  CDE:7.74   COLONOSCOPY  Jan 2002; 2012   2002: Dr. Tamala Julian, ext. hemorrhoids, nl colon; 2012-adenomatous polyps, gastritis on EGD   COLONOSCOPY N/A 04/24/2019   Procedure: COLONOSCOPY;  Surgeon: Rogene Houston, MD;  [facility];  Service: Endoscopy;  Laterality: N/A;   CYSTOSCOPY W/ URETERAL STENT PLACEMENT Bilateral 02/25/2018   Procedure: CYSTOSCOPY WITH BILATERAL RETROGRADE PYELOGRAM/BILATERAL URETERAL STENT PLACEMENT, BLADDER BIOPSIES WITH FULGERATION;  Surgeon: [name], MD;  Location: [facility];  Service: Urology;  Laterality: Bilateral;   CYSTOSCOPY/URETEROSCOPY/HOLMIUM LASER/STENT PLACEMENT Bilateral 03/21/2018   Procedure: CYSTOSCOPY/URETEROSCOPY/HOLMIUM LASER/STENT PLACEMENT;  Surgeon: [name], MD;  Location: [facility];  Service: Urology;  Laterality: Bilateral;  ONLY NEEDS 60 MIN   DILATION AND CURETTAGE OF UTERUS     ESOPHAGOGASTRODUODENOSCOPY  08/24/2011   ESOPHAGOGASTRODUODENOSCOPY; esophageal dilatation; Rogene Houston, MD;    ESOPHAGOGASTRODUODENOSCOPY N/A 04/22/2019   Procedure: ESOPHAGOGASTRODUODENOSCOPY (EGD);  Surgeon: Daneil Dolin, MD;  [facility];  Service: Endoscopy;  Laterality: N/A;   GIVENS CAPSULE STUDY  08/18/2011   Procedure: GIVENS CAPSULE STUDY;  Surgeon: Rogene Houston, MD;  [facility];  Service: Endoscopy;  Laterality: N/A;  730   KNEE ARTHROSCOPY W/ MENISCECTOMY  90's   Left   MALONEY DILATION  04/22/2019   Procedure: MALONEY DILATION;  Surgeon: Daneil Dolin, MD;  [facility];  Service: Endoscopy;;   ORIF ANKLE FRACTURE Right 90's   TONSILLECTOMY     TOTAL KNEE ARTHROPLASTY  10/20/2011   Procedure: TOTAL KNEE ARTHROPLASTY;  Surgeon: Ninetta Lights, MD;  Location: [facility];  Service: Orthopedics;  Laterality: Left;   UPPER GASTROINTESTINAL ENDOSCOPY     URETHRAL DILATION     Family History  Problem Relation Age of Onset   Diabetes Mother    Hypertension Mother    Arthritis Mother    Heart failure Mother    Migraines Mother    Kidney failure Mother    Leukemia Father    Migraines Father    Kidney failure Father    Diabetes Sister    Hypertension Sister    Diabetes Brother    Hypertension Brother    Hypertension Brother    Hypertension Brother    Hypertension Brother    Diabetes Brother    Diabetes Brother    Diabetes Brother    Pulmonary embolism Sister    Migraines Sister    Kidney failure Brother    Colon cancer Neg Hx    Colon polyps Neg Hx    Social History   Socioeconomic History   Marital status: Widowed    Spouse name: Not on file   Number of children: 2   Years of education: 41   Highest education level: Not on file  Occupational History   Occupation: retired     Comment: Licensed conveyancer Surveyor, quantity)    Employer: RETIRED  Tobacco Use   Smoking status: Never   Smokeless tobacco: Never  Vaping Use   Vaping Use: Never used  Substance and Sexual Activity   Alcohol use: No   Drug use: No   Sexual activity: Not Currently  Birth  control/protection: Post-menopausal  Other Topics Concern   Not on file  Social History Narrative   Lives at home with husband.   Right-handed.   1 cup caffeine per day.   Social Determinants of Health   Financial Resource Strain: Low Risk    Difficulty of Paying Living Expenses: Not very hard  Food Insecurity: No Food Insecurity   Worried About Charity fundraiser in the Last Year: Never true   Ran Out of Food in the Last Year: Never true  Transportation Needs: No Transportation Needs   Lack of Transportation (Medical): No   Lack of Transportation (Non-Medical): No  Physical Activity: Inactive   Days of Exercise per Week: 0 days   Minutes of Exercise per Session: 0 min  Stress: No Stress Concern Present   Feeling of Stress : Only a little  Social Connections: Moderately Isolated   Frequency of Communication with Friends and Family: More than three times a week   Frequency of Social Gatherings with Friends and Family: Once a week   Attends Religious Services: More than 4 times per year   Active Member of [employer] or Organizations: No   Attends Archivist Meetings: Never   Marital Status: Widowed    Tobacco Counseling Counseling given: Not Answered   Clinical Intake:                 Diabetic?Nutrition Risk Assessment:  Has the patient had any N/V/D within the last 2 months?  No  Does the patient have any non-healing wounds?  No  Has the patient had any unintentional weight loss or weight gain?  No   Diabetes:  Is the patient diabetic?  Yes  If diabetic, was a CBG obtained today?  No  Did the patient bring in their glucometer from home?  No  How often do you monitor your CBG's? Twice daily .   Financial Strains and Diabetes Management:  Are you having any financial strains with the device, your supplies or your medication? No .  Does the patient want to be seen by Chronic Care Management for management of their diabetes?  No  Would the patient  like to be referred to a Nutritionist or for Diabetic Management?  No   Diabetic Exams:  Diabetic Eye Exam: Completed 08-11-20. Overdue for diabetic eye exam. Pt has been advised about the importance in completing this exam. A referral has been placed today. Message sent to referral coordinator for scheduling purposes. Advised pt to expect a call from office referred to regarding appt.  Diabetic Foot Exam: Completed 12-01-19. Pt has been advised about the importance in completing this exam. Pt is scheduled for diabetic foot exam on 11-30-20.           Activities of Daily Living No flowsheet data found.  Patient Care Team: [name], MD as PCP - General (Family Medicine) Harl Bowie, Alphonse Guild, MD as PCP - Cardiology (Cardiology) Rogene Houston, MD (Gastroenterology) Renette Butters, MD as Attending Physician (Orthopedic Surgery)  Indicate any recent Medical Services you may have received from other than [facility] providers in the past year (date may be approximate).     Assessment:   This is a routine wellness examination for [name].  Hearing/Vision screen No results found.  Dietary issues and exercise activities discussed:     Goals Addressed   None   Depression Screen [facility] 2/9 Scores 08/26/2020 08/14/2020 03/11/2020 11/28/2019 09/10/2019 05/16/2019 03/19/2019  PHQ - 2 Score 1  3 1 - 2 0 -  PHQ- 9 Score 2 5 - - 4 3 -  Exception Documentation - - - Other- indicate reason in comment box - - Other- indicate reason in comment box  Not completed - - - - - - patient was not yet available at virtual visit-    Fall Risk Fall Risk  08/26/2020 08/14/2020 03/11/2020 05/16/2019 03/19/2019  Falls in the past year? 1 0 '1 1 1  '$ Number falls in past yr: 1 0 '1 1 1  '$ Injury with Fall? 0 0 0 0 1  Risk for fall due to : Impaired balance/gait;Impaired mobility History of fall(s);Impaired balance/gait - - -  Follow up Falls evaluation completed Falls evaluation completed;Education provided;Falls  prevention discussed;Follow up appointment - - -    FALL RISK PREVENTION PERTAINING TO THE HOME:  Any stairs in or around the home? No  If so, are there any without handrails? No  Home free of loose throw rugs in walkways, pet beds, electrical cords, etc? Yes  Adequate lighting in your home to reduce risk of falls? Yes   ASSISTIVE DEVICES UTILIZED TO PREVENT FALLS:  Life alert? No  Use of a cane, walker or w/c? Yes  Grab bars in the bathroom? Yes  Shower chair or bench in shower? Yes  Elevated toilet seat or a handicapped toilet? Yes   TIMED UP AND GO:  Was the test performed? No .  Length of time to ambulate 10 feet: NA sec.     Cognitive Function: MMSE - Mini Mental State Exam 04/23/2014  Orientation to time 2  Orientation to Place 2  Registration 0  Attention/ Calculation 0  Recall 0  Language- name 2 objects 2  Language- repeat 1  Language- follow 3 step command 3  Language- read & follow direction 1  Write a sentence 1  Copy design 1  Total score 13     6CIT Screen 09/10/2019 08/09/2018 08/02/2017  What Year? 0 points 4 points 0 points  What month? 0 points 0 points 0 points  What time? 0 points 0 points 0 points  Count back from 20 0 points 0 points 0 points  Months in reverse 2 points 0 points 0 points  Repeat phrase 2 points 0 points 4 points  Total Score '4 4 4    '$ Immunizations Immunization History  Administered Date(s) Administered   Fluad Quad(high Dose 65+) 12/04/2018, 11/28/2019   H1N1 12/19/2007   Influenza Split 11/23/2011   Influenza Whole 01/07/2004, 11/14/2007, 02/03/2009, 11/30/2009   Influenza, High Dose Seasonal PF 12/21/2017   Influenza,inj,Quad PF,6+ Mos 11/01/2012, 01/01/2014, 04/24/2015, 10/29/2015, 01/11/2017   PFIZER(Purple Top)SARS-COV-2 Vaccination 04/12/2019, 04/30/2019, 02/20/2020   Pneumococcal Conjugate-13 04/23/2014   Pneumococcal Polysaccharide-23 08/05/2010   Td 11/07/2003   Zoster, Live 04/12/2006    TDAP status: Due,  Education has been provided regarding the importance of this vaccine. Advised may receive this vaccine at local pharmacy or Health Dept. Aware to provide a copy of the vaccination record if obtained from local pharmacy or Health Dept. Verbalized acceptance and understanding.  Flu Vaccine status: Up to date  Pneumococcal vaccine status: Up to date  Covid-19 vaccine status: Completed vaccines  Qualifies for Shingles Vaccine? Yes   Zostavax completed No   Shingrix Completed?: No.    Education has been provided regarding the importance of this vaccine. Patient has been advised to call insurance company to determine out of pocket expense if they have not yet received this vaccine. Advised may  also receive vaccine at local pharmacy or Health Dept. Verbalized acceptance and understanding.  Screening Tests Health Maintenance  Topic Date Due   Zoster Vaccines- Shingrix (1 of 2) Never done   OPHTHALMOLOGY EXAM  05/19/2017   HEMOGLOBIN A1C  05/28/2020   COVID-19 Vaccine (4 - Booster for Pfizer series) 06/19/2020   TETANUS/TDAP  07/14/2022 (Originally 11/06/2013)   INFLUENZA VACCINE  09/15/2020   FOOT EXAM  11/30/2020   DEXA SCAN  Completed   PNA vac Low Risk Adult  Completed   HPV VACCINES  Aged Out    Health Maintenance  Health Maintenance Due  Topic Date Due   Zoster Vaccines- Shingrix (1 of 2) Never done   OPHTHALMOLOGY EXAM  05/19/2017   HEMOGLOBIN A1C  05/28/2020   COVID-19 Vaccine (4 - Booster for [place] series) 06/19/2020    Colorectal cancer screening: No longer required.   Mammogram status: Due now   Bone Density status: Completed 08-31-2005. Results reflect: Bone density results: NORMAL. Repeat every 5 years.  Lung Cancer Screening: (Low Dose CT Chest recommended if Age 77-80 years, 30 pack-year currently smoking OR have quit w/in 15years.) does not qualify.   Lung Cancer Screening Referral: NA  Additional Screening:  Hepatitis C Screening: does not qualify; Completed    Vision Screening: Recommended annual ophthalmology exams for early detection of glaucoma and other disorders of the eye. Is the patient up to date with their annual eye exam?  Yes  Who is the provider or what is the name of the office in which the patient attends annual eye exams? [facility]  If pt is not established with a provider, would they like to be referred to a provider to establish care? No .   Dental Screening: Recommended annual dental exams for proper oral hygiene  Community Resource Referral / Chronic Care Management: CRR required this visit?  No   CCM required this visit?  No      Plan:     I have personally reviewed and noted the following in the patient's chart:   Medical and social history Use of alcohol, tobacco or illicit drugs  Current medications and supplements including opioid prescriptions.  Functional ability and status Nutritional status Physical activity Advanced directives List of other physicians Hospitalizations, surgeries, and ER visits in previous 12 months Vitals Screenings to include cognitive, depression, and falls Referrals and appointments  In addition, I have reviewed and discussed with patient certain preventive protocols, quality metrics, and best practice recommendations. A written personalized care plan for preventive services as well as general preventive health recommendations were provided to patient.     Shelda Altes, CMA   09/10/2020   Nurse Notes: telehealth, spoke with kaylene authorized to speak on behalf of patient for  40 mins

## 2020-09-24 ENCOUNTER — Other Ambulatory Visit: Payer: Self-pay | Admitting: Family Medicine

## 2020-09-25 DIAGNOSIS — R0902 Hypoxemia: Secondary | ICD-10-CM | POA: Diagnosis not present

## 2020-09-25 DIAGNOSIS — J42 Unspecified chronic bronchitis: Secondary | ICD-10-CM | POA: Diagnosis not present

## 2020-09-29 NOTE — Progress Notes (Signed)
I connected with  [name] on 09/29/20 by an audio enabled telemedicine application and verified that I am speaking with the correct person using two identifiers.   I discussed the limitations, risks, security and privacy concerns of performing an evaluation and management service by telephone and the availability of in person appointments. I also discussed with the patient that there may be a patient responsible charge related to this service. The patient expressed understanding and verbally consented to this telephonic visit.  The patient was at home. The provider was Tula Nakayama MD who was in office. This was a telehealth visit.

## 2020-10-26 DIAGNOSIS — J42 Unspecified chronic bronchitis: Secondary | ICD-10-CM | POA: Diagnosis not present

## 2020-10-26 DIAGNOSIS — R0902 Hypoxemia: Secondary | ICD-10-CM | POA: Diagnosis not present

## 2020-10-29 ENCOUNTER — Telehealth (INDEPENDENT_AMBULATORY_CARE_PROVIDER_SITE_OTHER): Payer: Medicare HMO | Admitting: Family Medicine

## 2020-10-29 ENCOUNTER — Encounter: Payer: Self-pay | Admitting: Family Medicine

## 2020-10-29 ENCOUNTER — Other Ambulatory Visit: Payer: Self-pay

## 2020-10-29 DIAGNOSIS — M5441 Lumbago with sciatica, right side: Secondary | ICD-10-CM

## 2020-10-29 DIAGNOSIS — I10 Essential (primary) hypertension: Secondary | ICD-10-CM

## 2020-10-29 DIAGNOSIS — N1832 Chronic kidney disease, stage 3b: Secondary | ICD-10-CM | POA: Diagnosis not present

## 2020-10-29 DIAGNOSIS — E1149 Type 2 diabetes mellitus with other diabetic neurological complication: Secondary | ICD-10-CM | POA: Diagnosis not present

## 2020-10-29 DIAGNOSIS — G8929 Other chronic pain: Secondary | ICD-10-CM | POA: Diagnosis not present

## 2020-10-29 MED ORDER — IMIPRAMINE HCL 50 MG PO TABS: ORAL_TABLET | ORAL | 1 refills | Status: DC

## 2020-10-29 MED ORDER — OMEPRAZOLE 40 MG PO CPDR: DELAYED_RELEASE_CAPSULE | ORAL | 1 refills | Status: DC

## 2020-10-29 MED ORDER — AMLODIPINE BESYLATE 10 MG PO TABS: 10.0000 mg | ORAL_TABLET | Freq: Every day | ORAL | 1 refills | Status: DC

## 2020-10-29 MED ORDER — DABIGATRAN ETEXILATE MESYLATE 150 MG PO CAPS: ORAL_CAPSULE | ORAL | 3 refills | Status: DC

## 2020-10-29 MED ORDER — DONEPEZIL HCL 10 MG PO TABS
10.0000 mg | ORAL_TABLET | Freq: Every day | ORAL | 3 refills | Status: DC
Start: 2020-10-29 — End: 2021-07-22

## 2020-10-29 MED ORDER — HYDROXYZINE PAMOATE 25 MG PO CAPS: ORAL_CAPSULE | ORAL | 1 refills | Status: DC

## 2020-10-29 NOTE — Patient Instructions (Signed)
F/u in 3 months, call if you need me before  Very important that you get home health nurse to get labs and flu vaccine  If no h/h then please let us know in 2 weeks,it is VITAL you get your flu vaccine and your labs  Thanks for choosing  Primary Care, we consider it a privelige to serve you.

## 2020-10-29 NOTE — Progress Notes (Signed)
Virtual Visit via Telephone Note  I connected with [name], sister and [name] on 10/29/20 at  2:40 PM EDT by telephone and verified that I am speaking with the correct person using two identifiers.  Location: Patient: home Provider: office   I discussed the limitations, risks, security and privacy concerns of performing an evaluation and management service by telephone and the availability of in person appointments. I also discussed with the patient that there may be a patient responsible charge related to this service. The patient expressed understanding and agreed to proceed.   History of Present Illness:   blood sugar ranging from 85 to 189 fasting, generally better controlled, still has not had h/h nurse to come out to draw labs and mobility significantly challenged Chronic pain unchanged and adequately controlled on current regime  No falls Denies any recent fever, chills or cough Appetite is good and BM regular Observations/Objective: There were no vitals taken for this visit. Good communication with no confusion and intact memory. Alert and oriented x 3 No signs of respiratory distress during speech   Assessment and Plan:  Essential hypertension DASH diet and commitment to daily physical activity for a minimum of 30 minutes discussed and encouraged, as a part of hypertension management. The importance of attaining a healthy weight is also discussed.  BP/Weight 08/14/2020 01/10/2020 01/09/2020 11/28/2019 09/10/2019 07/18/2019 123456  Systolic BP XX123456 0000000 - 0000000 123456 123456 0000000  Diastolic BP 79 82 - 84 80 80 75  Wt. (Lbs) - - 260 280 243 243 235  BMI - - 40.72 43.85 38.06 38.06 36.81       Encounter for chronic pain management The patient's Controlled Substance registry is reviewed and compliance confirmed. Adequacy of  Pain control and level of function is assessed. Medication dosing is adjusted as deemed appropriate. Twelve weeks of medication is  prescribed , with a follow up appointment between 11 to 12 weeks .   Type 2 diabetes mellitus with neurological complications ([place]) Updated lab needed at/ before next visit. [name] is reminded of the importance of commitment to daily physical activity for 30 minutes or more, as able and the need to limit carbohydrate intake to 30 to 60 grams per meal to help with blood sugar control.   The need to take medication as prescribed, test blood sugar as directed, and to call between visits if there is a concern that blood sugar is uncontrolled is also discussed.   [name] is reminded of the importance of daily foot exam, annual eye examination, and good blood sugar, blood pressure and cholesterol control.  Diabetic Labs Latest Ref Rng & Units 01/09/2020 11/28/2019 08/27/2019 05/22/2019 04/24/2019  HbA1c <5.7 % of total Hgb - 9.9(H) 8.5(H) - -  Microalbumin mg/dL - - - - -  Micro/Creat Ratio <30 mcg/mg creat - - - - -  Chol <200 mg/dL - 138 - - -  HDL > OR = 50 mg/dL - 57 - - -  Calc LDL mg/dL (calc) - 56 - - -  Triglycerides <150 mg/dL - 171(H) - - -  Creatinine 0.44 - 1.00 mg/dL 1.26(H) 1.20(H) 1.18(H) 1.09(H) 1.08(H)   BP/Weight 08/14/2020 01/10/2020 01/09/2020 11/28/2019 09/10/2019 07/18/2019 123456  Systolic BP [id number] 0000000 - 0000000 123456 123456 0000000  Diastolic BP 79 82 - 84 80 80 75  Wt. (Lbs) - - 260 280 243 243 235  BMI - - 40.72 43.85 38.06 38.06 36.81   Foot/eye exam completion dates Latest Ref Rng &  Units 11/28/2019 07/18/2019  Eye Exam No Retinopathy - -  Foot exam Order - - -  Foot Form Completion - Done Done      Uncontrolled when last checked, needs labs  Right-sided low back pain with right-sided sciatica Chronic and unchanged continue current pain management routine  Morbid obesity ([place])  Patient re-educated about  the importance of commitment to a  minimum of 150 minutes of exercise per week as able.  The importance of healthy food choices with portion control discussed, as well  as eating regularly and within a 12 hour window most days. The need to choose "clean , green" food 50 to 75% of the time is discussed, as well as to make water the primary drink and set a goal of 64 ounces water daily.    Weight /BMI 01/09/2020 11/28/2019 09/10/2019  WEIGHT 260 lb 280 lb 243 lb  HEIGHT '5\' 7"'$  '5\' 7"'$  '5\' 7"'$   BMI 40.72 kg/m2 43.85 kg/m2 38.06 kg/m2    updtaed weight needed  Follow Up Instructions:    I discussed the assessment and treatment plan with the patient. The patient was provided an opportunity to ask questions and all were answered. The patient agreed with the plan and demonstrated an understanding of the instructions.   The patient was advised to call back or seek an in-person evaluation if the symptoms worsen or if the condition fails to improve as anticipated.  I provided  25 minutes of non-face-to-face time during this encounter.   Tula Nakayama, MD

## 2020-10-30 ENCOUNTER — Telehealth: Payer: Self-pay | Admitting: Family Medicine

## 2020-10-30 NOTE — Telephone Encounter (Signed)
Message sent to [place] [facility] to see if she can find a place

## 2020-10-30 NOTE — Telephone Encounter (Signed)
Calvin, lvm that pt cant been seen as they dont except the insurance   Can this be sent to [facility]

## 2020-10-31 NOTE — Telephone Encounter (Signed)
She responded that she received and will find a place to accept her and let us know

## 2020-11-01 ENCOUNTER — Other Ambulatory Visit: Payer: Self-pay | Admitting: Family Medicine

## 2020-11-03 ENCOUNTER — Telehealth: Payer: Self-pay | Admitting: Family Medicine

## 2020-11-03 ENCOUNTER — Encounter: Payer: Self-pay | Admitting: Family Medicine

## 2020-11-03 MED ORDER — OXYCODONE-ACETAMINOPHEN 10-325 MG PO TABS: ORAL_TABLET | ORAL | 0 refills | Status: AC

## 2020-11-03 NOTE — Assessment & Plan Note (Signed)
DASH diet and commitment to daily physical activity for a minimum of 30 minutes discussed and encouraged, as a part of hypertension management. The importance of attaining a healthy weight is also discussed.  BP/Weight 08/14/2020 01/10/2020 01/09/2020 11/28/2019 09/10/2019 07/18/2019 123456  Systolic BP [id number] 0000000 - 0000000 123456 123456 0000000  Diastolic BP 79 82 - 84 80 80 75  Wt. (Lbs) - - 260 280 243 243 235  BMI - - 40.72 43.85 38.06 38.06 36.81

## 2020-11-03 NOTE — Assessment & Plan Note (Signed)
Updated lab needed at/ before next visit. [name] is reminded of the importance of commitment to daily physical activity for 30 minutes or more, as able and the need to limit carbohydrate intake to 30 to 60 grams per meal to help with blood sugar control.   The need to take medication as prescribed, test blood sugar as directed, and to call between visits if there is a concern that blood sugar is uncontrolled is also discussed.   [name] is reminded of the importance of daily foot exam, annual eye examination, and good blood sugar, blood pressure and cholesterol control.  Diabetic Labs Latest Ref Rng & Units 01/09/2020 11/28/2019 08/27/2019 05/22/2019 04/24/2019  HbA1c <5.7 % of total Hgb - 9.9(H) 8.5(H) - -  Microalbumin mg/dL - - - - -  Micro/Creat Ratio <30 mcg/mg creat - - - - -  Chol <200 mg/dL - 138 - - -  HDL > OR = 50 mg/dL - 57 - - -  Calc LDL mg/dL (calc) - 56 - - -  Triglycerides <150 mg/dL - 171(H) - - -  Creatinine 0.44 - 1.00 mg/dL 1.26(H) 1.20(H) 1.18(H) 1.09(H) 1.08(H)   BP/Weight 08/14/2020 01/10/2020 01/09/2020 11/28/2019 09/10/2019 07/18/2019 123456  Systolic BP [id number] 0000000 - 0000000 123456 123456 0000000  Diastolic BP 79 82 - 84 80 80 75  Wt. (Lbs) - - 260 280 243 243 235  BMI - - 40.72 43.85 38.06 38.06 36.81   Foot/eye exam completion dates Latest Ref Rng & Units 11/28/2019 07/18/2019  Eye Exam No Retinopathy - -  Foot exam Order - - -  Foot Form Completion - Done Done      Uncontrolled when last checked, needs labs

## 2020-11-03 NOTE — Assessment & Plan Note (Signed)
The patient's Controlled Substance registry is reviewed and compliance confirmed. Adequacy of  Pain control and level of function is assessed. Medication dosing is adjusted as deemed appropriate. Twelve weeks of medication is prescribed , with a follow up appointment between 11 to 12 weeks .  

## 2020-11-03 NOTE — Telephone Encounter (Signed)
U with [name], this pt NEEDS to get labs by next week, h/h should be able to see  her home bound, multiple health conditions, high fall risk

## 2020-11-03 NOTE — Assessment & Plan Note (Signed)
  Patient re-educated about  the importance of commitment to a  minimum of 150 minutes of exercise per week as able.  The importance of healthy food choices with portion control discussed, as well as eating regularly and within a 12 hour window most days. The need to choose "clean , green" food 50 to 75% of the time is discussed, as well as to make water the primary drink and set a goal of 64 ounces water daily.    Weight /BMI 01/09/2020 11/28/2019 09/10/2019  WEIGHT 260 lb 280 lb 243 lb  HEIGHT '5\' 7"'$  '5\' 7"'$  '5\' 7"'$   BMI 40.72 kg/m2 43.85 kg/m2 38.06 kg/m2    updtaed weight needed

## 2020-11-03 NOTE — Assessment & Plan Note (Signed)
Chronic and unchanged continue current pain management routine

## 2020-11-04 NOTE — Telephone Encounter (Signed)
[name] is supposed to be taking her on a client but I was told to wait until they start seeing her to request labs

## 2020-11-05 ENCOUNTER — Other Ambulatory Visit: Payer: Self-pay

## 2020-11-05 ENCOUNTER — Telehealth: Payer: Self-pay

## 2020-11-05 ENCOUNTER — Other Ambulatory Visit: Payer: Self-pay | Admitting: Family Medicine

## 2020-11-05 DIAGNOSIS — E1149 Type 2 diabetes mellitus with other diabetic neurological complication: Secondary | ICD-10-CM

## 2020-11-05 DIAGNOSIS — I10 Essential (primary) hypertension: Secondary | ICD-10-CM

## 2020-11-05 DIAGNOSIS — N1832 Chronic kidney disease, stage 3b: Secondary | ICD-10-CM

## 2020-11-05 DIAGNOSIS — E785 Hyperlipidemia, unspecified: Secondary | ICD-10-CM

## 2020-11-05 NOTE — Telephone Encounter (Signed)
Melissa from [facility] called says the family is confused about the prescription that was sent in for Lantus, changing from [place] to Lantus.  Call back # [phone]

## 2020-11-05 NOTE — Telephone Encounter (Signed)
Was she supposed to be changed to lantus? On review, it looks like [facility] sent a refill request for lantus on the 17th and it was approved. She should JUST be on the novolin, correct?

## 2020-11-06 ENCOUNTER — Telehealth: Payer: Self-pay

## 2020-11-06 DIAGNOSIS — I2510 Atherosclerotic heart disease of native coronary artery without angina pectoris: Secondary | ICD-10-CM | POA: Diagnosis not present

## 2020-11-06 DIAGNOSIS — G8929 Other chronic pain: Secondary | ICD-10-CM | POA: Diagnosis not present

## 2020-11-06 DIAGNOSIS — I1310 Hypertensive heart and chronic kidney disease without heart failure, with stage 1 through stage 4 chronic kidney disease, or unspecified chronic kidney disease: Secondary | ICD-10-CM | POA: Diagnosis not present

## 2020-11-06 DIAGNOSIS — N1832 Chronic kidney disease, stage 3b: Secondary | ICD-10-CM | POA: Diagnosis not present

## 2020-11-06 DIAGNOSIS — J449 Chronic obstructive pulmonary disease, unspecified: Secondary | ICD-10-CM | POA: Diagnosis not present

## 2020-11-06 DIAGNOSIS — I4891 Unspecified atrial fibrillation: Secondary | ICD-10-CM | POA: Diagnosis not present

## 2020-11-06 DIAGNOSIS — M159 Polyosteoarthritis, unspecified: Secondary | ICD-10-CM | POA: Diagnosis not present

## 2020-11-06 DIAGNOSIS — M5441 Lumbago with sciatica, right side: Secondary | ICD-10-CM | POA: Diagnosis not present

## 2020-11-06 DIAGNOSIS — E1122 Type 2 diabetes mellitus with diabetic chronic kidney disease: Secondary | ICD-10-CM | POA: Diagnosis not present

## 2020-11-06 NOTE — Telephone Encounter (Signed)
Hassan Rowan from [place] [facility] called needed to have labs drawn ASAP for Dr Moshe Cipro, but unable to draw labs, call back # [phone]

## 2020-11-07 ENCOUNTER — Telehealth: Payer: Self-pay

## 2020-11-07 NOTE — Telephone Encounter (Signed)
Medication list faxed.

## 2020-11-07 NOTE — Telephone Encounter (Signed)
Elvin So called from [place] needs current medication list faxed over to [phone].

## 2020-11-07 NOTE — Telephone Encounter (Signed)
Left message for [name]

## 2020-11-11 DIAGNOSIS — I1310 Hypertensive heart and chronic kidney disease without heart failure, with stage 1 through stage 4 chronic kidney disease, or unspecified chronic kidney disease: Secondary | ICD-10-CM | POA: Diagnosis not present

## 2020-11-11 DIAGNOSIS — E1122 Type 2 diabetes mellitus with diabetic chronic kidney disease: Secondary | ICD-10-CM | POA: Diagnosis not present

## 2020-11-11 DIAGNOSIS — M5441 Lumbago with sciatica, right side: Secondary | ICD-10-CM | POA: Diagnosis not present

## 2020-11-11 DIAGNOSIS — J449 Chronic obstructive pulmonary disease, unspecified: Secondary | ICD-10-CM | POA: Diagnosis not present

## 2020-11-11 DIAGNOSIS — M159 Polyosteoarthritis, unspecified: Secondary | ICD-10-CM | POA: Diagnosis not present

## 2020-11-11 DIAGNOSIS — I4891 Unspecified atrial fibrillation: Secondary | ICD-10-CM | POA: Diagnosis not present

## 2020-11-11 DIAGNOSIS — I2510 Atherosclerotic heart disease of native coronary artery without angina pectoris: Secondary | ICD-10-CM | POA: Diagnosis not present

## 2020-11-11 DIAGNOSIS — G8929 Other chronic pain: Secondary | ICD-10-CM | POA: Diagnosis not present

## 2020-11-11 DIAGNOSIS — N1832 Chronic kidney disease, stage 3b: Secondary | ICD-10-CM | POA: Diagnosis not present

## 2020-11-13 DIAGNOSIS — E1149 Type 2 diabetes mellitus with other diabetic neurological complication: Secondary | ICD-10-CM | POA: Diagnosis not present

## 2020-11-13 DIAGNOSIS — N1832 Chronic kidney disease, stage 3b: Secondary | ICD-10-CM | POA: Diagnosis not present

## 2020-11-13 DIAGNOSIS — M5441 Lumbago with sciatica, right side: Secondary | ICD-10-CM | POA: Diagnosis not present

## 2020-11-13 DIAGNOSIS — I10 Essential (primary) hypertension: Secondary | ICD-10-CM | POA: Diagnosis not present

## 2020-11-13 DIAGNOSIS — I4891 Unspecified atrial fibrillation: Secondary | ICD-10-CM | POA: Diagnosis not present

## 2020-11-13 DIAGNOSIS — I2510 Atherosclerotic heart disease of native coronary artery without angina pectoris: Secondary | ICD-10-CM | POA: Diagnosis not present

## 2020-11-13 DIAGNOSIS — G8929 Other chronic pain: Secondary | ICD-10-CM | POA: Diagnosis not present

## 2020-11-13 DIAGNOSIS — E1122 Type 2 diabetes mellitus with diabetic chronic kidney disease: Secondary | ICD-10-CM | POA: Diagnosis not present

## 2020-11-13 DIAGNOSIS — M159 Polyosteoarthritis, unspecified: Secondary | ICD-10-CM | POA: Diagnosis not present

## 2020-11-13 DIAGNOSIS — E785 Hyperlipidemia, unspecified: Secondary | ICD-10-CM | POA: Diagnosis not present

## 2020-11-13 DIAGNOSIS — I1310 Hypertensive heart and chronic kidney disease without heart failure, with stage 1 through stage 4 chronic kidney disease, or unspecified chronic kidney disease: Secondary | ICD-10-CM | POA: Diagnosis not present

## 2020-11-13 DIAGNOSIS — J449 Chronic obstructive pulmonary disease, unspecified: Secondary | ICD-10-CM | POA: Diagnosis not present

## 2020-11-13 LAB — LIPID PANEL
Cholesterol: 147 (ref 0–200)
LDL Cholesterol: 63
Triglycerides: 179 — AB (ref 40–160)

## 2020-11-13 LAB — HEMOGLOBIN A1C: Hemoglobin A1C: 7

## 2020-11-14 NOTE — Telephone Encounter (Signed)
Before I was out I faxed the orders to [facility] that the labs needed to be done urgently. I see messages in the chart between them and [name] where they said they could not do it but then the next msg they asked for a med list which was sent. Not sure If they ever drew them or not?

## 2020-11-18 DIAGNOSIS — I2510 Atherosclerotic heart disease of native coronary artery without angina pectoris: Secondary | ICD-10-CM | POA: Diagnosis not present

## 2020-11-18 DIAGNOSIS — G8929 Other chronic pain: Secondary | ICD-10-CM | POA: Diagnosis not present

## 2020-11-18 DIAGNOSIS — M159 Polyosteoarthritis, unspecified: Secondary | ICD-10-CM | POA: Diagnosis not present

## 2020-11-18 DIAGNOSIS — E1122 Type 2 diabetes mellitus with diabetic chronic kidney disease: Secondary | ICD-10-CM | POA: Diagnosis not present

## 2020-11-18 DIAGNOSIS — I4891 Unspecified atrial fibrillation: Secondary | ICD-10-CM | POA: Diagnosis not present

## 2020-11-18 DIAGNOSIS — I1310 Hypertensive heart and chronic kidney disease without heart failure, with stage 1 through stage 4 chronic kidney disease, or unspecified chronic kidney disease: Secondary | ICD-10-CM | POA: Diagnosis not present

## 2020-11-18 DIAGNOSIS — J449 Chronic obstructive pulmonary disease, unspecified: Secondary | ICD-10-CM | POA: Diagnosis not present

## 2020-11-18 DIAGNOSIS — N1832 Chronic kidney disease, stage 3b: Secondary | ICD-10-CM | POA: Diagnosis not present

## 2020-11-18 DIAGNOSIS — M5441 Lumbago with sciatica, right side: Secondary | ICD-10-CM | POA: Diagnosis not present

## 2020-11-19 DIAGNOSIS — M159 Polyosteoarthritis, unspecified: Secondary | ICD-10-CM | POA: Diagnosis not present

## 2020-11-19 DIAGNOSIS — N1832 Chronic kidney disease, stage 3b: Secondary | ICD-10-CM | POA: Diagnosis not present

## 2020-11-19 DIAGNOSIS — J449 Chronic obstructive pulmonary disease, unspecified: Secondary | ICD-10-CM | POA: Diagnosis not present

## 2020-11-19 DIAGNOSIS — I4891 Unspecified atrial fibrillation: Secondary | ICD-10-CM | POA: Diagnosis not present

## 2020-11-19 DIAGNOSIS — D509 Iron deficiency anemia, unspecified: Secondary | ICD-10-CM

## 2020-11-19 DIAGNOSIS — I1310 Hypertensive heart and chronic kidney disease without heart failure, with stage 1 through stage 4 chronic kidney disease, or unspecified chronic kidney disease: Secondary | ICD-10-CM | POA: Diagnosis not present

## 2020-11-19 DIAGNOSIS — E1122 Type 2 diabetes mellitus with diabetic chronic kidney disease: Secondary | ICD-10-CM | POA: Diagnosis not present

## 2020-11-19 DIAGNOSIS — I2510 Atherosclerotic heart disease of native coronary artery without angina pectoris: Secondary | ICD-10-CM | POA: Diagnosis not present

## 2020-11-19 DIAGNOSIS — G8929 Other chronic pain: Secondary | ICD-10-CM | POA: Diagnosis not present

## 2020-11-19 DIAGNOSIS — M5441 Lumbago with sciatica, right side: Secondary | ICD-10-CM | POA: Diagnosis not present

## 2020-11-20 ENCOUNTER — Telehealth: Payer: Self-pay | Admitting: Family Medicine

## 2020-11-20 DIAGNOSIS — M159 Polyosteoarthritis, unspecified: Secondary | ICD-10-CM | POA: Diagnosis not present

## 2020-11-20 DIAGNOSIS — E1122 Type 2 diabetes mellitus with diabetic chronic kidney disease: Secondary | ICD-10-CM | POA: Diagnosis not present

## 2020-11-20 DIAGNOSIS — G8929 Other chronic pain: Secondary | ICD-10-CM | POA: Diagnosis not present

## 2020-11-20 DIAGNOSIS — J449 Chronic obstructive pulmonary disease, unspecified: Secondary | ICD-10-CM | POA: Diagnosis not present

## 2020-11-20 DIAGNOSIS — M5441 Lumbago with sciatica, right side: Secondary | ICD-10-CM | POA: Diagnosis not present

## 2020-11-20 DIAGNOSIS — I1310 Hypertensive heart and chronic kidney disease without heart failure, with stage 1 through stage 4 chronic kidney disease, or unspecified chronic kidney disease: Secondary | ICD-10-CM | POA: Diagnosis not present

## 2020-11-20 DIAGNOSIS — I2510 Atherosclerotic heart disease of native coronary artery without angina pectoris: Secondary | ICD-10-CM | POA: Diagnosis not present

## 2020-11-20 DIAGNOSIS — N1832 Chronic kidney disease, stage 3b: Secondary | ICD-10-CM | POA: Diagnosis not present

## 2020-11-20 DIAGNOSIS — I4891 Unspecified atrial fibrillation: Secondary | ICD-10-CM | POA: Diagnosis not present

## 2020-11-20 NOTE — Telephone Encounter (Signed)
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 Needs to keep b;lood sugar and blood pressure well controlled to protect kidneys, report at your station for reference, thanks

## 2020-11-21 DIAGNOSIS — M5441 Lumbago with sciatica, right side: Secondary | ICD-10-CM | POA: Diagnosis not present

## 2020-11-21 DIAGNOSIS — N1832 Chronic kidney disease, stage 3b: Secondary | ICD-10-CM | POA: Diagnosis not present

## 2020-11-21 DIAGNOSIS — M159 Polyosteoarthritis, unspecified: Secondary | ICD-10-CM | POA: Diagnosis not present

## 2020-11-21 DIAGNOSIS — J449 Chronic obstructive pulmonary disease, unspecified: Secondary | ICD-10-CM | POA: Diagnosis not present

## 2020-11-21 DIAGNOSIS — G8929 Other chronic pain: Secondary | ICD-10-CM | POA: Diagnosis not present

## 2020-11-21 DIAGNOSIS — E1122 Type 2 diabetes mellitus with diabetic chronic kidney disease: Secondary | ICD-10-CM | POA: Diagnosis not present

## 2020-11-21 DIAGNOSIS — I4891 Unspecified atrial fibrillation: Secondary | ICD-10-CM | POA: Diagnosis not present

## 2020-11-21 DIAGNOSIS — I1310 Hypertensive heart and chronic kidney disease without heart failure, with stage 1 through stage 4 chronic kidney disease, or unspecified chronic kidney disease: Secondary | ICD-10-CM | POA: Diagnosis not present

## 2020-11-21 DIAGNOSIS — I2510 Atherosclerotic heart disease of native coronary artery without angina pectoris: Secondary | ICD-10-CM | POA: Diagnosis not present

## 2020-11-24 ENCOUNTER — Telehealth: Payer: Self-pay | Admitting: Family Medicine

## 2020-11-24 DIAGNOSIS — G8929 Other chronic pain: Secondary | ICD-10-CM | POA: Diagnosis not present

## 2020-11-24 DIAGNOSIS — M159 Polyosteoarthritis, unspecified: Secondary | ICD-10-CM | POA: Diagnosis not present

## 2020-11-24 DIAGNOSIS — I4891 Unspecified atrial fibrillation: Secondary | ICD-10-CM | POA: Diagnosis not present

## 2020-11-24 DIAGNOSIS — E1122 Type 2 diabetes mellitus with diabetic chronic kidney disease: Secondary | ICD-10-CM | POA: Diagnosis not present

## 2020-11-24 DIAGNOSIS — I2510 Atherosclerotic heart disease of native coronary artery without angina pectoris: Secondary | ICD-10-CM | POA: Diagnosis not present

## 2020-11-24 DIAGNOSIS — M5441 Lumbago with sciatica, right side: Secondary | ICD-10-CM | POA: Diagnosis not present

## 2020-11-24 DIAGNOSIS — I1310 Hypertensive heart and chronic kidney disease without heart failure, with stage 1 through stage 4 chronic kidney disease, or unspecified chronic kidney disease: Secondary | ICD-10-CM | POA: Diagnosis not present

## 2020-11-24 DIAGNOSIS — J449 Chronic obstructive pulmonary disease, unspecified: Secondary | ICD-10-CM | POA: Diagnosis not present

## 2020-11-24 DIAGNOSIS — N1832 Chronic kidney disease, stage 3b: Secondary | ICD-10-CM | POA: Diagnosis not present

## 2020-11-24 NOTE — Telephone Encounter (Signed)
Leonette Monarch called on behalf of patient in regards to back pains worsening . Wants to discuss other option to better control the pain, can reach [facility] @  [phone]

## 2020-11-24 NOTE — Telephone Encounter (Signed)
Sister [name] aware

## 2020-11-25 DIAGNOSIS — R0902 Hypoxemia: Secondary | ICD-10-CM | POA: Diagnosis not present

## 2020-11-25 DIAGNOSIS — J42 Unspecified chronic bronchitis: Secondary | ICD-10-CM | POA: Diagnosis not present

## 2020-11-25 NOTE — Telephone Encounter (Signed)
She is not concerned about her pain level- [name] wasn't there when the therapist asked her pain level and when he is not there she doesn't complain to her, she thinks she is just sore from the exercises and has no concerns at this time .

## 2020-11-27 DIAGNOSIS — G8929 Other chronic pain: Secondary | ICD-10-CM | POA: Diagnosis not present

## 2020-11-27 DIAGNOSIS — J449 Chronic obstructive pulmonary disease, unspecified: Secondary | ICD-10-CM | POA: Diagnosis not present

## 2020-11-27 DIAGNOSIS — M159 Polyosteoarthritis, unspecified: Secondary | ICD-10-CM | POA: Diagnosis not present

## 2020-11-27 DIAGNOSIS — E1122 Type 2 diabetes mellitus with diabetic chronic kidney disease: Secondary | ICD-10-CM | POA: Diagnosis not present

## 2020-11-27 DIAGNOSIS — I4891 Unspecified atrial fibrillation: Secondary | ICD-10-CM | POA: Diagnosis not present

## 2020-11-27 DIAGNOSIS — I1310 Hypertensive heart and chronic kidney disease without heart failure, with stage 1 through stage 4 chronic kidney disease, or unspecified chronic kidney disease: Secondary | ICD-10-CM | POA: Diagnosis not present

## 2020-11-27 DIAGNOSIS — I2510 Atherosclerotic heart disease of native coronary artery without angina pectoris: Secondary | ICD-10-CM | POA: Diagnosis not present

## 2020-11-27 DIAGNOSIS — M5441 Lumbago with sciatica, right side: Secondary | ICD-10-CM | POA: Diagnosis not present

## 2020-11-27 DIAGNOSIS — N1832 Chronic kidney disease, stage 3b: Secondary | ICD-10-CM | POA: Diagnosis not present

## 2020-11-28 DIAGNOSIS — D509 Iron deficiency anemia, unspecified: Secondary | ICD-10-CM

## 2020-11-28 DIAGNOSIS — I4891 Unspecified atrial fibrillation: Secondary | ICD-10-CM | POA: Diagnosis not present

## 2020-11-28 DIAGNOSIS — M5441 Lumbago with sciatica, right side: Secondary | ICD-10-CM | POA: Diagnosis not present

## 2020-11-28 DIAGNOSIS — E1122 Type 2 diabetes mellitus with diabetic chronic kidney disease: Secondary | ICD-10-CM | POA: Diagnosis not present

## 2020-11-28 DIAGNOSIS — J449 Chronic obstructive pulmonary disease, unspecified: Secondary | ICD-10-CM | POA: Diagnosis not present

## 2020-11-28 DIAGNOSIS — G8929 Other chronic pain: Secondary | ICD-10-CM | POA: Diagnosis not present

## 2020-11-28 DIAGNOSIS — F32A Depression, unspecified: Secondary | ICD-10-CM

## 2020-11-28 DIAGNOSIS — F419 Anxiety disorder, unspecified: Secondary | ICD-10-CM

## 2020-11-28 DIAGNOSIS — I1310 Hypertensive heart and chronic kidney disease without heart failure, with stage 1 through stage 4 chronic kidney disease, or unspecified chronic kidney disease: Secondary | ICD-10-CM | POA: Diagnosis not present

## 2020-11-28 DIAGNOSIS — N1832 Chronic kidney disease, stage 3b: Secondary | ICD-10-CM | POA: Diagnosis not present

## 2020-11-28 DIAGNOSIS — M159 Polyosteoarthritis, unspecified: Secondary | ICD-10-CM | POA: Diagnosis not present

## 2020-11-28 DIAGNOSIS — I2510 Atherosclerotic heart disease of native coronary artery without angina pectoris: Secondary | ICD-10-CM | POA: Diagnosis not present

## 2020-12-02 DIAGNOSIS — N1832 Chronic kidney disease, stage 3b: Secondary | ICD-10-CM | POA: Diagnosis not present

## 2020-12-02 DIAGNOSIS — I1310 Hypertensive heart and chronic kidney disease without heart failure, with stage 1 through stage 4 chronic kidney disease, or unspecified chronic kidney disease: Secondary | ICD-10-CM | POA: Diagnosis not present

## 2020-12-02 DIAGNOSIS — J449 Chronic obstructive pulmonary disease, unspecified: Secondary | ICD-10-CM | POA: Diagnosis not present

## 2020-12-02 DIAGNOSIS — M159 Polyosteoarthritis, unspecified: Secondary | ICD-10-CM | POA: Diagnosis not present

## 2020-12-02 DIAGNOSIS — M5441 Lumbago with sciatica, right side: Secondary | ICD-10-CM | POA: Diagnosis not present

## 2020-12-02 DIAGNOSIS — I2510 Atherosclerotic heart disease of native coronary artery without angina pectoris: Secondary | ICD-10-CM | POA: Diagnosis not present

## 2020-12-02 DIAGNOSIS — G8929 Other chronic pain: Secondary | ICD-10-CM | POA: Diagnosis not present

## 2020-12-02 DIAGNOSIS — E1122 Type 2 diabetes mellitus with diabetic chronic kidney disease: Secondary | ICD-10-CM | POA: Diagnosis not present

## 2020-12-02 DIAGNOSIS — I4891 Unspecified atrial fibrillation: Secondary | ICD-10-CM | POA: Diagnosis not present

## 2020-12-09 DIAGNOSIS — M159 Polyosteoarthritis, unspecified: Secondary | ICD-10-CM | POA: Diagnosis not present

## 2020-12-09 DIAGNOSIS — G8929 Other chronic pain: Secondary | ICD-10-CM | POA: Diagnosis not present

## 2020-12-09 DIAGNOSIS — I4891 Unspecified atrial fibrillation: Secondary | ICD-10-CM | POA: Diagnosis not present

## 2020-12-09 DIAGNOSIS — I2510 Atherosclerotic heart disease of native coronary artery without angina pectoris: Secondary | ICD-10-CM | POA: Diagnosis not present

## 2020-12-09 DIAGNOSIS — J449 Chronic obstructive pulmonary disease, unspecified: Secondary | ICD-10-CM | POA: Diagnosis not present

## 2020-12-09 DIAGNOSIS — M5441 Lumbago with sciatica, right side: Secondary | ICD-10-CM | POA: Diagnosis not present

## 2020-12-09 DIAGNOSIS — E1122 Type 2 diabetes mellitus with diabetic chronic kidney disease: Secondary | ICD-10-CM | POA: Diagnosis not present

## 2020-12-09 DIAGNOSIS — N1832 Chronic kidney disease, stage 3b: Secondary | ICD-10-CM | POA: Diagnosis not present

## 2020-12-09 DIAGNOSIS — I1310 Hypertensive heart and chronic kidney disease without heart failure, with stage 1 through stage 4 chronic kidney disease, or unspecified chronic kidney disease: Secondary | ICD-10-CM | POA: Diagnosis not present

## 2020-12-12 DIAGNOSIS — M5441 Lumbago with sciatica, right side: Secondary | ICD-10-CM | POA: Diagnosis not present

## 2020-12-12 DIAGNOSIS — G8929 Other chronic pain: Secondary | ICD-10-CM | POA: Diagnosis not present

## 2020-12-12 DIAGNOSIS — M159 Polyosteoarthritis, unspecified: Secondary | ICD-10-CM | POA: Diagnosis not present

## 2020-12-12 DIAGNOSIS — E1122 Type 2 diabetes mellitus with diabetic chronic kidney disease: Secondary | ICD-10-CM | POA: Diagnosis not present

## 2020-12-12 DIAGNOSIS — I4891 Unspecified atrial fibrillation: Secondary | ICD-10-CM | POA: Diagnosis not present

## 2020-12-12 DIAGNOSIS — I1310 Hypertensive heart and chronic kidney disease without heart failure, with stage 1 through stage 4 chronic kidney disease, or unspecified chronic kidney disease: Secondary | ICD-10-CM | POA: Diagnosis not present

## 2020-12-12 DIAGNOSIS — I2510 Atherosclerotic heart disease of native coronary artery without angina pectoris: Secondary | ICD-10-CM | POA: Diagnosis not present

## 2020-12-12 DIAGNOSIS — N1832 Chronic kidney disease, stage 3b: Secondary | ICD-10-CM | POA: Diagnosis not present

## 2020-12-12 DIAGNOSIS — J449 Chronic obstructive pulmonary disease, unspecified: Secondary | ICD-10-CM | POA: Diagnosis not present

## 2020-12-17 DIAGNOSIS — N1832 Chronic kidney disease, stage 3b: Secondary | ICD-10-CM | POA: Diagnosis not present

## 2020-12-17 DIAGNOSIS — G8929 Other chronic pain: Secondary | ICD-10-CM | POA: Diagnosis not present

## 2020-12-17 DIAGNOSIS — J449 Chronic obstructive pulmonary disease, unspecified: Secondary | ICD-10-CM | POA: Diagnosis not present

## 2020-12-17 DIAGNOSIS — I2510 Atherosclerotic heart disease of native coronary artery without angina pectoris: Secondary | ICD-10-CM | POA: Diagnosis not present

## 2020-12-17 DIAGNOSIS — I1310 Hypertensive heart and chronic kidney disease without heart failure, with stage 1 through stage 4 chronic kidney disease, or unspecified chronic kidney disease: Secondary | ICD-10-CM | POA: Diagnosis not present

## 2020-12-17 DIAGNOSIS — E1122 Type 2 diabetes mellitus with diabetic chronic kidney disease: Secondary | ICD-10-CM | POA: Diagnosis not present

## 2020-12-17 DIAGNOSIS — M159 Polyosteoarthritis, unspecified: Secondary | ICD-10-CM | POA: Diagnosis not present

## 2020-12-17 DIAGNOSIS — M5441 Lumbago with sciatica, right side: Secondary | ICD-10-CM | POA: Diagnosis not present

## 2020-12-17 DIAGNOSIS — I4891 Unspecified atrial fibrillation: Secondary | ICD-10-CM | POA: Diagnosis not present

## 2020-12-22 ENCOUNTER — Other Ambulatory Visit: Payer: Self-pay | Admitting: Family Medicine

## 2020-12-24 DIAGNOSIS — M5441 Lumbago with sciatica, right side: Secondary | ICD-10-CM | POA: Diagnosis not present

## 2020-12-24 DIAGNOSIS — N1832 Chronic kidney disease, stage 3b: Secondary | ICD-10-CM | POA: Diagnosis not present

## 2020-12-24 DIAGNOSIS — J449 Chronic obstructive pulmonary disease, unspecified: Secondary | ICD-10-CM | POA: Diagnosis not present

## 2020-12-24 DIAGNOSIS — G8929 Other chronic pain: Secondary | ICD-10-CM | POA: Diagnosis not present

## 2020-12-24 DIAGNOSIS — I2510 Atherosclerotic heart disease of native coronary artery without angina pectoris: Secondary | ICD-10-CM | POA: Diagnosis not present

## 2020-12-24 DIAGNOSIS — E1122 Type 2 diabetes mellitus with diabetic chronic kidney disease: Secondary | ICD-10-CM | POA: Diagnosis not present

## 2020-12-24 DIAGNOSIS — I4891 Unspecified atrial fibrillation: Secondary | ICD-10-CM | POA: Diagnosis not present

## 2020-12-24 DIAGNOSIS — M159 Polyosteoarthritis, unspecified: Secondary | ICD-10-CM | POA: Diagnosis not present

## 2020-12-24 DIAGNOSIS — I1310 Hypertensive heart and chronic kidney disease without heart failure, with stage 1 through stage 4 chronic kidney disease, or unspecified chronic kidney disease: Secondary | ICD-10-CM | POA: Diagnosis not present

## 2020-12-26 ENCOUNTER — Ambulatory Visit: Payer: Medicare HMO | Admitting: Cardiology

## 2020-12-26 DIAGNOSIS — R0902 Hypoxemia: Secondary | ICD-10-CM | POA: Diagnosis not present

## 2020-12-26 DIAGNOSIS — J42 Unspecified chronic bronchitis: Secondary | ICD-10-CM | POA: Diagnosis not present

## 2021-01-01 ENCOUNTER — Other Ambulatory Visit: Payer: Self-pay | Admitting: Family Medicine

## 2021-01-01 DIAGNOSIS — I2510 Atherosclerotic heart disease of native coronary artery without angina pectoris: Secondary | ICD-10-CM | POA: Diagnosis not present

## 2021-01-01 DIAGNOSIS — E1122 Type 2 diabetes mellitus with diabetic chronic kidney disease: Secondary | ICD-10-CM | POA: Diagnosis not present

## 2021-01-01 DIAGNOSIS — M5441 Lumbago with sciatica, right side: Secondary | ICD-10-CM | POA: Diagnosis not present

## 2021-01-01 DIAGNOSIS — N1832 Chronic kidney disease, stage 3b: Secondary | ICD-10-CM | POA: Diagnosis not present

## 2021-01-01 DIAGNOSIS — J449 Chronic obstructive pulmonary disease, unspecified: Secondary | ICD-10-CM | POA: Diagnosis not present

## 2021-01-01 DIAGNOSIS — I1310 Hypertensive heart and chronic kidney disease without heart failure, with stage 1 through stage 4 chronic kidney disease, or unspecified chronic kidney disease: Secondary | ICD-10-CM | POA: Diagnosis not present

## 2021-01-01 DIAGNOSIS — M159 Polyosteoarthritis, unspecified: Secondary | ICD-10-CM | POA: Diagnosis not present

## 2021-01-01 DIAGNOSIS — G8929 Other chronic pain: Secondary | ICD-10-CM | POA: Diagnosis not present

## 2021-01-01 DIAGNOSIS — I4891 Unspecified atrial fibrillation: Secondary | ICD-10-CM | POA: Diagnosis not present

## 2021-01-21 ENCOUNTER — Other Ambulatory Visit: Payer: Self-pay | Admitting: Family Medicine

## 2021-01-28 ENCOUNTER — Other Ambulatory Visit: Payer: Self-pay | Admitting: Family Medicine

## 2021-01-29 ENCOUNTER — Encounter (INDEPENDENT_AMBULATORY_CARE_PROVIDER_SITE_OTHER): Payer: Self-pay

## 2021-01-29 ENCOUNTER — Other Ambulatory Visit: Payer: Self-pay

## 2021-01-29 ENCOUNTER — Ambulatory Visit (INDEPENDENT_AMBULATORY_CARE_PROVIDER_SITE_OTHER): Payer: Medicare HMO | Admitting: Family Medicine

## 2021-01-29 DIAGNOSIS — I10 Essential (primary) hypertension: Secondary | ICD-10-CM | POA: Diagnosis not present

## 2021-01-29 DIAGNOSIS — E1149 Type 2 diabetes mellitus with other diabetic neurological complication: Secondary | ICD-10-CM | POA: Diagnosis not present

## 2021-01-29 DIAGNOSIS — G8929 Other chronic pain: Secondary | ICD-10-CM | POA: Diagnosis not present

## 2021-01-29 DIAGNOSIS — E785 Hyperlipidemia, unspecified: Secondary | ICD-10-CM

## 2021-01-29 DIAGNOSIS — M159 Polyosteoarthritis, unspecified: Secondary | ICD-10-CM

## 2021-01-29 DIAGNOSIS — N3946 Mixed incontinence: Secondary | ICD-10-CM

## 2021-01-29 MED ORDER — OXYCODONE-ACETAMINOPHEN 10-325 MG PO TABS: ORAL_TABLET | ORAL | 0 refills | Status: DC

## 2021-01-29 NOTE — Progress Notes (Signed)
Virtual Visit via Telephone Note  I connected with [name] and sister [name] on 01/29/21 at  2:20 PM EST by telephone and verified that I am speaking with the correct person using two identifiers.  Location: Patient: home Provider: office   I discussed the limitations, risks, security and privacy concerns of performing an evaluation and management service by telephone and the availability of in person appointments. I also discussed with the patient that there may be a patient responsible charge related to this service. The patient expressed understanding and agreed to proceed.   History of Present Illness:  F/U chronic problems and address any new or current concerns.No new concerns doing fairly well, much improved blood sugar Review and update medications and allergies. Review recent lab and radiologic data . Update routine health maintainace. Review an encourage improved health habits to include nutrition, exercise and  sleep .    Observations/Objective: There were no vitals taken for this visit. Good communication Alert and oriented x 3 No signs of respiratory distress during speech Sister is main historian   Assessment and Plan: Encounter for chronic pain management The patient's Controlled Substance registry is reviewed and compliance confirmed. Adequacy of  Pain control and level of function is assessed. Medication dosing is adjusted as deemed appropriate. Twelve weeks of medication is prescribed , with a follow up appointment between 11 to 12 weeks .   Essential hypertension DASH diet and commitment to daily physical activity for a minimum of 30 minutes discussed and encouraged, as a part of hypertension management. The importance of attaining a healthy weight is also discussed.  BP/Weight 08/14/2020 01/10/2020 01/09/2020 11/28/2019 09/10/2019 07/18/2019 09/03/9468  Systolic BP 962 836 - 629 476 546 503  Diastolic BP 79 82 - 84 80 80 75  Wt. (Lbs) - - 260 280 243  243 235  BMI - - 40.72 43.85 38.06 38.06 36.81       Morbid obesity ([facility])  Patient re-educated about  the importance of commitment to a  minimum of 150 minutes of exercise per week as able.  The importance of healthy food choices with portion control discussed, as well as eating regularly and within a 12 hour window most days. The need to choose "clean , green" food 50 to 75% of the time is discussed, as well as to make water the primary drink and set a goal of 64 ounces water daily.    Weight /BMI 01/09/2020 11/28/2019 09/10/2019  WEIGHT 260 lb 280 lb 243 lb  HEIGHT 5\' 7"  5\' 7"  5\' 7"   BMI 40.72 kg/m2 43.85 kg/m2 38.06 kg/m2      Type 2 diabetes mellitus with neurological complications ([place]) [name] is reminded of the importance of commitment to daily physical activity for 30 minutes or more, as able and the need to limit carbohydrate intake to 30 to 60 grams per meal to help with blood sugar control.   The need to take medication as prescribed, test blood sugar as directed, and to call between visits if there is a concern that blood sugar is uncontrolled is also discussed.   [name] is reminded of the importance of daily foot exam, annual eye examination, and good blood sugar, blood pressure and cholesterol control.  Diabetic Labs Latest Ref Rng & Units 11/13/2020 01/09/2020 11/28/2019 08/27/2019 05/22/2019  HbA1c - 7.0 - 9.9(H) 8.5(H) -  Microalbumin mg/dL - - - - -  Micro/Creat Ratio <30 mcg/mg creat - - - - -  Chol 0 -  200 147 - 138 - -  HDL > OR = 50 mg/dL - - 57 - -  Calc LDL - 63 - 56 - -  Triglycerides 40 - 160 179(A) - 171(H) - -  Creatinine 0.44 - 1.00 mg/dL - 1.26(H) 1.20(H) 1.18(H) 1.09(H)   BP/Weight 08/14/2020 01/10/2020 01/09/2020 11/28/2019 09/10/2019 07/18/2019 11/06/1939  Systolic BP 740 814 - 481 856 314 970  Diastolic BP 79 82 - 84 80 80 75  Wt. (Lbs) - - 260 280 243 243 235  BMI - - 40.72 43.85 38.06 38.06 36.81   Foot/eye exam completion dates Latest Ref  Rng & Units 11/28/2019 07/18/2019  Eye Exam No Retinopathy - -  Foot exam Order - - -  Foot Form Completion - Done Done        Urinary incontinence Unchanged , needs incontinence supplies  Hyperlipidemia LDL goal <100 Hyperlipidemia:Low fat diet discussed and encouraged.   Lipid Panel  Lab Results  Component Value Date   CHOL 147 11/13/2020   HDL 57 11/28/2019   LDLCALC 63 11/13/2020   TRIG 179 (A) 11/13/2020   CHOLHDL 2.4 11/28/2019  needs to reduce fat in diet    Generalized osteoarthritis Home safety and fall risk reduction dicussed   Follow Up Instructions:    I discussed the assessment and treatment plan with the patient. The patient was provided an opportunity to ask questions and all were answered. The patient agreed with the plan and demonstrated an understanding of the instructions.   The patient was advised to call back or seek an in-person evaluation if the symptoms worsen or if the condition fails to improve as anticipated.  I provided 30 minutes of non-face-to-face time during this encounter.   Tula Nakayama, MD

## 2021-01-29 NOTE — Patient Instructions (Addendum)
F/U mid March , call if you need me sooner ° °Congrats on much improved blood sugar , no changes in medication, keep up the great work ° °Non fasting hBA1C, chem 7 and EGFR  3 to 5 days before next appointment ° °Thanks for choosing [facility], we consider it a privelige to serve you. ° Best for2023 °

## 2021-02-09 ENCOUNTER — Encounter: Payer: Self-pay | Admitting: Family Medicine

## 2021-02-09 MED ORDER — OXYCODONE-ACETAMINOPHEN 10-325 MG PO TABS: ORAL_TABLET | ORAL | 0 refills | Status: DC

## 2021-02-09 NOTE — Assessment & Plan Note (Signed)
[name] is reminded of the importance of commitment to daily physical activity for 30 minutes or more, as able and the need to limit carbohydrate intake to 30 to 60 grams per meal to help with blood sugar control.   The need to take medication as prescribed, test blood sugar as directed, and to call between visits if there is a concern that blood sugar is uncontrolled is also discussed.   [name] is reminded of the importance of daily foot exam, annual eye examination, and good blood sugar, blood pressure and cholesterol control.  Diabetic Labs Latest Ref Rng & Units 11/13/2020 01/09/2020 11/28/2019 08/27/2019 05/22/2019  HbA1c - 7.0 - 9.9(H) 8.5(H) -  Microalbumin mg/dL - - - - -  Micro/Creat Ratio <30 mcg/mg creat - - - - -  Chol 0 - 200 147 - 138 - -  HDL > OR = 50 mg/dL - - 57 - -  Calc LDL - 63 - 56 - -  Triglycerides 40 - 160 179(A) - 171(H) - -  Creatinine 0.44 - 1.00 mg/dL - 1.26(H) 1.20(H) 1.18(H) 1.09(H)   BP/Weight 08/14/2020 01/10/2020 01/09/2020 11/28/2019 09/10/2019 07/18/2019 9/60/4540  Systolic BP 981 191 - 478 295 621 308  Diastolic BP 79 82 - 84 80 80 75  Wt. (Lbs) - - 260 280 243 243 235  BMI - - 40.72 43.85 38.06 38.06 36.81   Foot/eye exam completion dates Latest Ref Rng & Units 11/28/2019 07/18/2019  Eye Exam No Retinopathy - -  Foot exam Order - - -  Foot Form Completion - Done Done

## 2021-02-09 NOTE — Assessment & Plan Note (Signed)
The patient's Controlled Substance registry is reviewed and compliance confirmed. Adequacy of  Pain control and level of function is assessed. Medication dosing is adjusted as deemed appropriate. Twelve weeks of medication is prescribed , with a follow up appointment between 11 to 12 weeks .  

## 2021-02-09 NOTE — Assessment & Plan Note (Signed)
°  Patient re-educated about  the importance of commitment to a  minimum of 150 minutes of exercise per week as able.  The importance of healthy food choices with portion control discussed, as well as eating regularly and within a 12 hour window most days. The need to choose "clean , green" food 50 to 75% of the time is discussed, as well as to make water the primary drink and set a goal of 64 ounces water daily.    Weight /BMI 01/09/2020 11/28/2019 09/10/2019  WEIGHT 260 lb 280 lb 243 lb  HEIGHT 5\' 7"  5\' 7"  5\' 7"   BMI 40.72 kg/m2 43.85 kg/m2 38.06 kg/m2

## 2021-02-09 NOTE — Assessment & Plan Note (Signed)
Unchanged , needs incontinence supplies

## 2021-02-09 NOTE — Assessment & Plan Note (Signed)
Home safety and fall risk reduction dicussed

## 2021-02-09 NOTE — Assessment & Plan Note (Signed)
Hyperlipidemia:Low fat diet discussed and encouraged.   Lipid Panel  Lab Results  Component Value Date   CHOL 147 11/13/2020   HDL 57 11/28/2019   LDLCALC 63 11/13/2020   TRIG 179 (A) 11/13/2020   CHOLHDL 2.4 11/28/2019  needs to reduce fat in diet

## 2021-02-09 NOTE — Assessment & Plan Note (Signed)
DASH diet and commitment to daily physical activity for a minimum of 30 minutes discussed and encouraged, as a part of hypertension management. The importance of attaining a healthy weight is also discussed.  BP/Weight 08/14/2020 01/10/2020 01/09/2020 11/28/2019 09/10/2019 07/18/2019 7/82/9562  Systolic BP 130 865 - 784 696 295 284  Diastolic BP 79 82 - 84 80 80 75  Wt. (Lbs) - - 260 280 243 243 235  BMI - - 40.72 43.85 38.06 38.06 36.81

## 2021-02-12 ENCOUNTER — Other Ambulatory Visit: Payer: Self-pay | Admitting: Family Medicine

## 2021-02-21 ENCOUNTER — Other Ambulatory Visit: Payer: Self-pay | Admitting: Family Medicine

## 2021-03-05 ENCOUNTER — Other Ambulatory Visit: Payer: Self-pay | Admitting: Family Medicine

## 2021-03-18 ENCOUNTER — Other Ambulatory Visit: Payer: Self-pay | Admitting: Family Medicine

## 2021-03-31 ENCOUNTER — Other Ambulatory Visit: Payer: Self-pay | Admitting: Family Medicine

## 2021-04-09 ENCOUNTER — Other Ambulatory Visit: Payer: Self-pay | Admitting: Family Medicine

## 2021-04-23 ENCOUNTER — Other Ambulatory Visit: Payer: Self-pay | Admitting: Family Medicine

## 2021-04-28 ENCOUNTER — Other Ambulatory Visit: Payer: Self-pay | Admitting: Family Medicine

## 2021-04-29 ENCOUNTER — Telehealth: Payer: Medicare HMO | Admitting: Family Medicine

## 2021-05-01 ENCOUNTER — Ambulatory Visit (INDEPENDENT_AMBULATORY_CARE_PROVIDER_SITE_OTHER): Payer: Medicare HMO | Admitting: Family Medicine

## 2021-05-01 ENCOUNTER — Encounter: Payer: Self-pay | Admitting: Family Medicine

## 2021-05-01 ENCOUNTER — Other Ambulatory Visit: Payer: Self-pay

## 2021-05-01 DIAGNOSIS — I10 Essential (primary) hypertension: Secondary | ICD-10-CM

## 2021-05-01 DIAGNOSIS — G43109 Migraine with aura, not intractable, without status migrainosus: Secondary | ICD-10-CM

## 2021-05-01 DIAGNOSIS — E1149 Type 2 diabetes mellitus with other diabetic neurological complication: Secondary | ICD-10-CM | POA: Diagnosis not present

## 2021-05-01 DIAGNOSIS — E785 Hyperlipidemia, unspecified: Secondary | ICD-10-CM

## 2021-05-01 DIAGNOSIS — E559 Vitamin D deficiency, unspecified: Secondary | ICD-10-CM

## 2021-05-01 DIAGNOSIS — G8929 Other chronic pain: Secondary | ICD-10-CM | POA: Diagnosis not present

## 2021-05-01 NOTE — Patient Instructions (Addendum)
F/U in 12 weeks, call if tou need me before ? ? ?Nurse visit for CBC, fasting lipid, cmp and EGFR, hBA1C, tSH and vit D  ? ?Nurse visit for shingrix vaccines ? ?Please be careful not to fall ? ?Thanks for choosing [facility], we consider it a privelige to serve you. ? ? ? ?

## 2021-05-01 NOTE — Progress Notes (Signed)
Virtual Visit via Telephone Note ? ?I connected with [name], sister of KARLISHA MATHENA on 05/01/21 at  4:40 PM EDT by telephone and verified that I am speaking with the correct person using two identifiers. ? ?Location: ?Patient: home ?Provider: off ?  ?I discussed the limitations, risks, security and privacy concerns of performing an evaluation and management service by telephone and the availability of in person appointments. I also discussed with the patient that there may be a patient responsible charge related to this service. The patient expressed understanding and agreed to proceed. ? ? ?History of Present Illness: ?History from sister ?Blood suagrs 90 to 120 and blood pressure 128/82 reportedly. Sytates doing well. Still not  very mobile ?Pain management seems adequate. ?No recent fever, chills, cough, malodorous urine ?No new concerns ?  ?Observations/Objective: ?There were no vitals taken for this visit. ? ? ?Assessment and Plan: ?Type 2 diabetes mellitus with neurological complications ([place]) ?Ms. Manzo is reminded of the importance of commitment to daily physical activity for 30 minutes or more, as able and the need to limit carbohydrate intake to 30 to 60 grams per meal to help with blood sugar control.  ? ?The need to take medication as prescribed, test blood sugar as directed, and to call between visits if there is a concern that blood sugar is uncontrolled is also discussed.  ? ?Ms. Dearden is reminded of the importance of daily foot exam, annual eye examination, and good blood sugar, blood pressure and cholesterol control. ? ? ?  Latest Ref Rng & Units 11/13/2020  ? 12:00 AM 01/09/2020  ?  3:25 PM 11/28/2019  ?  3:01 PM 08/27/2019  ?  3:27 PM 05/22/2019  ? 12:25 PM  ?Diabetic Labs  ?HbA1c  7.0       9.9   8.5     ?Chol 0 - 200 147       138      ?HDL > OR = 50 mg/dL   57      ?Calc LDL  63       56      ?Triglycerides 40 - 160 179       171      ?Creatinine 0.44 - 1.00 mg/dL  1.26   1.20   1.18    1.09    ?  ? This result is from an external source.  ? ? ?  08/14/2020  ?  2:48 PM 08/14/2020  ?  2:01 PM 01/10/2020  ?  2:15 AM 01/09/2020  ? 10:10 PM 01/09/2020  ?  8:17 PM 01/09/2020  ?  7:00 PM 01/09/2020  ?  6:30 PM  ?BP/Weight  ?Systolic BP 798 921 194 174 182 143 157  ?Diastolic BP 79 97 82 82 88 104 145  ? ? ?  Latest Ref Rng & Units 08/19/2020  ? 12:00 AM 11/28/2019  ?  1:40 PM  ?Foot/eye exam completion dates  ?Eye Exam No Retinopathy No Retinopathy        ?Foot Form Completion   Done  ?  ? This result is from an external source.  ? ? ? ? ?Updated lab needed at/ before next visit. ? ? ?Essential hypertension ?DASH diet and commitment to daily physical activity for a minimum of 30 minutes discussed and encouraged, as a part of hypertension management. ?The importance of attaining a healthy weight is also discussed. ? ? ?  08/14/2020  ?  2:48 PM 08/14/2020  ?  2:01  PM 01/10/2020  ?  2:15 AM 01/09/2020  ? 10:10 PM 01/09/2020  ?  8:17 PM 01/09/2020  ?  7:00 PM 01/09/2020  ?  6:30 PM  ?BP/Weight  ?Systolic BP 109 323 557 322 182 143 157  ?Diastolic BP 79 97 82 82 88 104 145  ? ? ? ?Reports good blood pressures at home ? ?Encounter for chronic pain management ?The patient's Controlled Substance registry is reviewed and compliance confirmed. ?Adequacy of  Pain control and level of function is assessed. ?Medication dosing is adjusted as deemed appropriate. ?Twelve weeks of medication is prescribed , with a follow up appointment between 11 to 12 weeks . ? ? ?Hyperlipidemia LDL goal <100 ?Hyperlipidemia:Low fat diet discussed and encouraged. ? ? ?Lipid Panel  ?Lab Results  ?Component Value Date  ? CHOL 147 11/13/2020  ? HDL 57 11/28/2019  ? [place] 63 11/13/2020  ? TRIG 179 (A) 11/13/2020  ? CHOLHDL 2.4 11/28/2019  ? ? ? ?Updated lab needed at/ before next visit. ? ? ?Migraine ?Controlled, no change in medication ? ? ? ?Follow Up Instructions: ? ?  ?I discussed the assessment and treatment plan with the patient. The  patient was provided an opportunity to ask questions and all were answered. The patient agreed with the plan and demonstrated an understanding of the instructions. ?  ?The patient was advised to call back or seek an in-person evaluation if the symptoms worsen or if the condition fails to improve as anticipated. ? ?I provided 23 minutes of non-face-to-face time during this encounter. ? ? ?Tula Nakayama, MD ? ?

## 2021-05-06 ENCOUNTER — Telehealth: Payer: Self-pay

## 2021-05-06 ENCOUNTER — Encounter: Payer: Self-pay | Admitting: Family Medicine

## 2021-05-06 MED ORDER — OXYCODONE-ACETAMINOPHEN 10-325 MG PO TABS: ORAL_TABLET | ORAL | 0 refills | Status: DC

## 2021-05-06 MED ORDER — OXYCODONE-ACETAMINOPHEN 10-325 MG PO TABS
ORAL_TABLET | ORAL | 0 refills | Status: DC
Start: 2021-06-19 — End: 2021-08-09

## 2021-05-06 NOTE — Assessment & Plan Note (Signed)
The patient's Controlled Substance registry is reviewed and compliance confirmed. Adequacy of  Pain control and level of function is assessed. Medication dosing is adjusted as deemed appropriate. Twelve weeks of medication is prescribed , with a follow up appointment between 11 to 12 weeks .  

## 2021-05-06 NOTE — Assessment & Plan Note (Signed)
[name] is reminded of the importance of commitment to daily physical activity for 30 minutes or more, as able and the need to limit carbohydrate intake to 30 to 60 grams per meal to help with blood sugar control.  ? ?The need to take medication as prescribed, test blood sugar as directed, and to call between visits if there is a concern that blood sugar is uncontrolled is also discussed.  ? ?[name] is reminded of the importance of daily foot exam, annual eye examination, and good blood sugar, blood pressure and cholesterol control. ? ? ?  Latest Ref Rng & Units 11/13/2020  ? 12:00 AM 01/09/2020  ?  3:25 PM 11/28/2019  ?  3:01 PM 08/27/2019  ?  3:27 PM 05/22/2019  ? 12:25 PM  ?Diabetic Labs  ?HbA1c  7.0       9.9   8.5     ?Chol 0 - 200 147       138      ?HDL > OR = 50 mg/dL   57      ?Calc LDL  63       56      ?Triglycerides 40 - 160 179       171      ?Creatinine 0.44 - 1.00 mg/dL  1.26   1.20   1.18   1.09    ?  ? This result is from an external source.  ? ? ?  08/14/2020  ?  2:48 PM 08/14/2020  ?  2:01 PM 01/10/2020  ?  2:15 AM 01/09/2020  ? 10:10 PM 01/09/2020  ?  8:17 PM 01/09/2020  ?  7:00 PM 01/09/2020  ?  6:30 PM  ?BP/Weight  ?Systolic BP 161 096 045 409 182 143 157  ?Diastolic BP 79 97 82 82 88 104 145  ? ? ?  Latest Ref Rng & Units 08/19/2020  ? 12:00 AM 11/28/2019  ?  1:40 PM  ?Foot/eye exam completion dates  ?Eye Exam No Retinopathy No Retinopathy        ?Foot Form Completion   Done  ?  ? This result is from an external source.  ? ? ? ? ?Updated lab needed at/ before next visit. ? ?

## 2021-05-06 NOTE — Assessment & Plan Note (Signed)
Controlled, no change in medication  

## 2021-05-06 NOTE — Assessment & Plan Note (Signed)
DASH diet and commitment to daily physical activity for a minimum of 30 minutes discussed and encouraged, as a part of hypertension management. ?The importance of attaining a healthy weight is also discussed. ? ? ?  08/14/2020  ?  2:48 PM 08/14/2020  ?  2:01 PM 01/10/2020  ?  2:15 AM 01/09/2020  ? 10:10 PM 01/09/2020  ?  8:17 PM 01/09/2020  ?  7:00 PM 01/09/2020  ?  6:30 PM  ?BP/Weight  ?Systolic BP 629 476 546 503 182 143 157  ?Diastolic BP 79 97 82 82 88 104 145  ? ? ? ?Reports good blood pressures at home ?

## 2021-05-06 NOTE — Telephone Encounter (Signed)
Jinny Blossom called from [place]. asked to have labs drawn, can not do this has not seen the patient since November 2022. Please give Jinny Blossom a call back. ?

## 2021-05-06 NOTE — Assessment & Plan Note (Signed)
Hyperlipidemia:Low fat diet discussed and encouraged. ? ? ?Lipid Panel  ?Lab Results  ?Component Value Date  ? CHOL 147 11/13/2020  ? HDL 57 11/28/2019  ? [place] 63 11/13/2020  ? TRIG 179 (A) 11/13/2020  ? CHOLHDL 2.4 11/28/2019  ? ? ? ?Updated lab needed at/ before next visit. ? ?

## 2021-05-07 ENCOUNTER — Other Ambulatory Visit: Payer: Self-pay

## 2021-05-07 DIAGNOSIS — E1149 Type 2 diabetes mellitus with other diabetic neurological complication: Secondary | ICD-10-CM

## 2021-05-07 NOTE — Telephone Encounter (Signed)
Referral sent to care management per alisa  ?

## 2021-05-07 NOTE — Telephone Encounter (Signed)
Do you think referring her to [id number] to the nurse case manager could see her re: her diabetes and possibly do this? Not sure. I know we can't refer back to home health to only see her for blood draws. She would have to be on services to get this done also. I have sent a message to Janalyn Shy to see if this is something they could help with or give me some feedback  ?

## 2021-05-08 ENCOUNTER — Other Ambulatory Visit: Payer: Self-pay | Admitting: Family Medicine

## 2021-05-20 ENCOUNTER — Telehealth: Payer: Self-pay | Admitting: *Deleted

## 2021-05-20 NOTE — Chronic Care Management (AMB) (Signed)
?  Care Management  ? ?Note ? ?05/20/2021 ?Name: CAROLEANN CASLER MRN: [id number] DOB: 1940/08/20 ? ?[name] is a 81 y.o. year old female who is a primary care patient of [name], MD. I reached out to [name] by phone today offer care coordination services.  ? ?[name] was given information about care management services today including:  ?Care management services include personalized support from designated clinical staff supervised by her physician, including individualized plan of care and coordination with other care providers ?24/7 contact phone numbers for assistance for urgent and routine care needs. ?The patient may stop care management services at any time by phone call to the office staff. ? ?[name],[name] DPR on file  verbally agreed to assistance and services provided by embedded care coordination/care management team today. ? ?Follow up plan: ?Telephone appointment with care management team member scheduled for:07/02/21 ? ?[name]  ?Care Guide, Embedded Care Coordination ?[facility]  ?Direct Dial: [phone] ? ?

## 2021-05-21 ENCOUNTER — Other Ambulatory Visit: Payer: Self-pay | Admitting: Family Medicine

## 2021-05-28 ENCOUNTER — Other Ambulatory Visit: Payer: Self-pay | Admitting: Family Medicine

## 2021-06-03 ENCOUNTER — Other Ambulatory Visit: Payer: Self-pay | Admitting: Family Medicine

## 2021-06-16 ENCOUNTER — Telehealth: Payer: Self-pay | Admitting: Family Medicine

## 2021-06-16 NOTE — Telephone Encounter (Signed)
Pt sister called stating that she has been waiting on information about a "team" where a nurse would be set up with our office for someone to come & see pt at home. States that she was told a nurse would call, but she has not heard anything back. Wants to know if this is going to take place? ? ?[name] (sister) call back # [phone] ?

## 2021-06-18 ENCOUNTER — Other Ambulatory Visit: Payer: Self-pay | Admitting: Family Medicine

## 2021-07-02 ENCOUNTER — Ambulatory Visit: Payer: Medicare HMO | Admitting: *Deleted

## 2021-07-02 ENCOUNTER — Telehealth: Payer: Self-pay

## 2021-07-02 DIAGNOSIS — E1149 Type 2 diabetes mellitus with other diabetic neurological complication: Secondary | ICD-10-CM

## 2021-07-02 DIAGNOSIS — I120 Hypertensive chronic kidney disease with stage 5 chronic kidney disease or end stage renal disease: Secondary | ICD-10-CM

## 2021-07-02 DIAGNOSIS — I10 Essential (primary) hypertension: Secondary | ICD-10-CM

## 2021-07-02 NOTE — Patient Instructions (Addendum)
Visit Information  Thank you for taking time to visit with me today. Please don't hesitate to contact me if I can be of assistance to you before our next scheduled telephone appointment.  Following are the goals we discussed today:  Take medications as prescribed   Attend all scheduled provider appointments Call pharmacy for medication refills 3-7 days in advance of running out of medications Call provider office for new concerns or questions  check blood sugar at prescribed times: twice daily check feet daily for cuts, sores or redness enter blood sugar readings and medication or insulin into daily log take the blood sugar log to all doctor visits take the blood sugar meter to all doctor visits fill half of plate with vegetables check blood pressure weekly choose a place to take my blood pressure (home, clinic or office, retail store) write blood pressure results in a log or diary take blood pressure log to all doctor appointments take medications for blood pressure exactly as prescribed eat more whole grains, fruits and vegetables, lean meats and healthy fats Follow carbohydrate modified diet Care guide will contact you about resources for wheel chair ramp Palliative care of [place] was contacted, be expecting a call from them Look over education mailed- hypoglycemia and low sodium diet  DASH Eating Plan DASH stands for Dietary Approaches to Stop Hypertension. The DASH eating plan is a healthy eating plan that has been shown to: Reduce high blood pressure (hypertension). Reduce your risk for type 2 diabetes, heart disease, and stroke. Help with weight loss. What are tips for following this plan? Reading food labels Check food labels for the amount of salt (sodium) per serving. Choose foods with less than 5 percent of the Daily Value of sodium. Generally, foods with less than 300 milligrams (mg) of sodium per serving fit into this eating plan. To find whole grains, look for  the word "whole" as the first word in the ingredient list. Shopping Buy products labeled as "low-sodium" or "no salt added." Buy fresh foods. Avoid canned foods and pre-made or frozen meals. Cooking Avoid adding salt when cooking. Use salt-free seasonings or herbs instead of table salt or sea salt. Check with your health care provider or pharmacist before using salt substitutes. Do not fry foods. Cook foods using healthy methods such as baking, boiling, grilling, roasting, and broiling instead. Cook with heart-healthy oils, such as olive, canola, avocado, soybean, or sunflower oil. Meal planning  Eat a balanced diet that includes: 4 or more servings of fruits and 4 or more servings of vegetables each day. Try to fill one-half of your plate with fruits and vegetables. 6-8 servings of whole grains each day. Less than 6 oz (170 g) of lean meat, poultry, or fish each day. A 3-oz (85-g) serving of meat is about the same size as a deck of cards. One egg equals 1 oz (28 g). 2-3 servings of low-fat dairy each day. One serving is 1 cup (237 mL). 1 serving of nuts, seeds, or beans 5 times each week. 2-3 servings of heart-healthy fats. Healthy fats called omega-3 fatty acids are found in foods such as walnuts, flaxseeds, fortified milks, and eggs. These fats are also found in cold-water fish, such as sardines, salmon, and mackerel. Limit how much you eat of: Canned or prepackaged foods. Food that is high in trans fat, such as some fried foods. Food that is high in saturated fat, such as fatty meat. Desserts and other sweets, sugary drinks, and other foods with  added sugar. Full-fat dairy products. Do not salt foods before eating. Do not eat more than 4 egg yolks a week. Try to eat at least 2 vegetarian meals a week. Eat more home-cooked food and less restaurant, buffet, and fast food. Lifestyle When eating at a restaurant, ask that your food be prepared with less salt or no salt, if possible. If  you drink alcohol: Limit how much you use to: 0-1 drink a day for women who are not pregnant. 0-2 drinks a day for men. Be aware of how much alcohol is in your drink. In the [place] one drink equals one 12 oz bottle of beer (355 mL), one 5 oz glass of wine (148 mL), or one 1 oz glass of hard liquor (44 mL). General information Avoid eating more than 2,300 mg of salt a day. If you have hypertension, you may need to reduce your sodium intake to 1,500 mg a day. Work with your health care provider to maintain a healthy body weight or to lose weight. Ask what an ideal weight is for you. Get at least 30 minutes of exercise that causes your heart to beat faster (aerobic exercise) most days of the week. Activities may include walking, swimming, or biking. Work with your health care provider or dietitian to adjust your eating plan to your individual calorie needs. What foods should I eat? Fruits All fresh, dried, or frozen fruit. Canned fruit in natural juice (without added sugar). Vegetables Fresh or frozen vegetables (raw, steamed, roasted, or grilled). Low-sodium or reduced-sodium tomato and vegetable juice. Low-sodium or reduced-sodium tomato sauce and tomato paste. Low-sodium or reduced-sodium canned vegetables. Grains Whole-grain or whole-wheat bread. Whole-grain or whole-wheat pasta. Brown rice. [place]. Bulgur. Whole-grain and low-sodium cereals. Pita bread. Low-fat, low-sodium crackers. Whole-wheat flour tortillas. Meats and other proteins Skinless chicken or [place]. Ground chicken or [place]. Pork with fat trimmed off. Fish and seafood. Egg whites. Dried beans, peas, or lentils. Unsalted nuts, nut butters, and seeds. Unsalted canned beans. Lean cuts of beef with fat trimmed off. Low-sodium, lean precooked or cured meat, such as sausages or meat loaves. Dairy Low-fat (1%) or fat-free (skim) milk. Reduced-fat, low-fat, or fat-free cheeses. Nonfat, low-sodium ricotta or cottage cheese.  Low-fat or nonfat yogurt. Low-fat, low-sodium cheese. Fats and oils Soft margarine without trans fats. Vegetable oil. Reduced-fat, low-fat, or light mayonnaise and salad dressings (reduced-sodium). Canola, safflower, olive, avocado, soybean, and sunflower oils. Avocado. Seasonings and condiments Herbs. Spices. Seasoning mixes without salt. Other foods Unsalted popcorn and pretzels. Fat-free sweets. The items listed above may not be a complete list of foods and beverages you can eat. Contact a dietitian for more information. What foods should I avoid? Fruits Canned fruit in a light or heavy syrup. Fried fruit. Fruit in cream or butter sauce. Vegetables Creamed or fried vegetables. Vegetables in a cheese sauce. Regular canned vegetables (not low-sodium or reduced-sodium). Regular canned tomato sauce and paste (not low-sodium or reduced-sodium). Regular tomato and vegetable juice (not low-sodium or reduced-sodium). Angie Fava. Olives. Grains Baked goods made with fat, such as croissants, muffins, or some breads. Dry pasta or rice meal packs. Meats and other proteins Fatty cuts of meat. Ribs. Fried meat. Berniece Salines. Bologna, salami, and other precooked or cured meats, such as sausages or meat loaves. Fat from the back of a pig (fatback). Bratwurst. Salted nuts and seeds. Canned beans with added salt. Canned or smoked fish. Whole eggs or egg yolks. Chicken or [place] with skin. Dairy Whole or 2% milk, cream, and  half-and-half. Whole or full-fat cream cheese. Whole-fat or sweetened yogurt. Full-fat cheese. Nondairy creamers. Whipped toppings. Processed cheese and cheese spreads. Fats and oils Butter. Stick margarine. Lard. Shortening. Ghee. Bacon fat. Tropical oils, such as coconut, palm kernel, or palm oil. Seasonings and condiments Onion salt, garlic salt, seasoned salt, table salt, and sea salt. Worcestershire sauce. Tartar sauce. Barbecue sauce. Teriyaki sauce. Soy sauce, including reduced-sodium. Steak  sauce. Canned and packaged gravies. Fish sauce. Oyster sauce. Cocktail sauce. Store-bought horseradish. Ketchup. Mustard. Meat flavorings and tenderizers. Bouillon cubes. Hot sauces. Pre-made or packaged marinades. Pre-made or packaged taco seasonings. Relishes. Regular salad dressings. Other foods Salted popcorn and pretzels. The items listed above may not be a complete list of foods and beverages you should avoid. Contact a dietitian for more information. Where to find more information National Heart, Lung, and Blood Institute: https://wilson-eaton.com/ American Heart Association: www.heart.org Academy of Nutrition and Dietetics: www.eatright.[place]: www.kidney.org Summary The DASH eating plan is a healthy eating plan that has been shown to reduce high blood pressure (hypertension). It may also reduce your risk for type 2 diabetes, heart disease, and stroke. When on the DASH eating plan, aim to eat more fresh fruits and vegetables, whole grains, lean proteins, low-fat dairy, and heart-healthy fats. With the DASH eating plan, you should limit salt (sodium) intake to 2,300 mg a day. If you have hypertension, you may need to reduce your sodium intake to 1,500 mg a day. Work with your health care provider or dietitian to adjust your eating plan to your individual calorie needs. This information is not intended to replace advice given to you by your health care provider. Make sure you discuss any questions you have with your health care provider. Document Revised: 01/05/2019 Document Reviewed: 01/05/2019 Elsevier Patient Education  [place].  Hypoglycemia Hypoglycemia occurs when the level of sugar (glucose) in the blood is too low. Hypoglycemia can happen in people who have or do not have diabetes. It can develop quickly, and it can be a medical emergency. For most people, a blood glucose level below 70 mg/dL (3.9 mmol/L) is considered hypoglycemia. Glucose is a type of  sugar that provides the body's main source of energy. Certain hormones (insulin and glucagon) control the level of glucose in the blood. Insulin lowers blood glucose, and glucagon raises blood glucose. Hypoglycemia can result from having too much insulin in the bloodstream, or from not eating enough food that contains glucose. You may also have reactive hypoglycemia, which happens within 4 hours after eating a meal. What are the causes? Hypoglycemia occurs most often in people who have diabetes and may be caused by: Diabetes medicine. Not eating enough, or not eating often enough. Increased physical activity. Drinking alcohol on an empty stomach. If you do not have diabetes, hypoglycemia may be caused by: A tumor in the pancreas. Not eating enough, or not eating for long periods at a time (fasting). A severe infection or illness. Problems after having bariatric surgery. Organ failure, such as kidney or liver failure. Certain medicines. What increases the risk? Hypoglycemia is more likely to develop in people who: Have diabetes and take medicines to lower blood glucose. Abuse alcohol. Have a severe illness. What are the signs or symptoms? Symptoms vary depending on whether the condition is mild, moderate, or severe. Mild hypoglycemia Hunger. Sweating and feeling clammy. Dizziness or feeling light-headed. Sleepiness or restless sleep. Nausea. Increased heart rate. Headache. Blurry vision. Mood changes, such as irritability or anxiety. Tingling or  numbness around the mouth, lips, or tongue. Moderate hypoglycemia Confusion and poor judgment. Behavior changes. Weakness. Irregular heartbeat. A change in coordination. Severe hypoglycemia Severe hypoglycemia is a medical emergency. It can cause: Fainting. Seizures. Loss of consciousness (coma). Death. How is this diagnosed? Hypoglycemia is diagnosed with a blood test to measure your blood glucose level. This blood test is done  while you are having symptoms. Your health care provider may also do a physical exam and review your medical history. How is this treated? This condition can be treated by immediately eating or drinking something that contains sugar with 15 grams of fast-acting carbohydrate, such as: 4 oz (120 mL) of fruit juice. 4 oz (120 mL) of regular soda (not diet soda). Several pieces of hard candy. Check food labels to find out how many pieces to eat for 15 grams. 1 Tbsp (15 mL) of sugar or honey. 4 glucose tablets. 1 tube of glucose gel. Treating hypoglycemia if you have diabetes If you are alert and able to swallow safely, follow the 15:15 rule: Take 15 grams of a fast-acting carbohydrate. Talk with your health care provider about how much you should take. Options for getting 15 grams of fast-acting carbohydrate include: Glucose tablets (take 4 tablets). Several pieces of hard candy. Check food labels to find out how many pieces to eat for 15 grams. 4 oz (120 mL) of fruit juice. 4 oz (120 mL) of regular soda (not diet soda). 1 Tbsp (15 mL) of sugar or honey. 1 tube of glucose gel. Check your blood glucose 15 minutes after you take the carbohydrate. If the repeat blood glucose level is still at or below 70 mg/dL (3.9 mmol/L), take 15 grams of a carbohydrate again. If your blood glucose level does not increase above 70 mg/dL (3.9 mmol/L) after 3 tries, seek emergency medical care. After your blood glucose level returns to normal, eat a meal or a snack within 1 hour.  Treating severe hypoglycemia Severe hypoglycemia is when your blood glucose level is below 54 mg/dL (3 mmol/L). Severe hypoglycemia is a medical emergency. Get medical help right away. If you have severe hypoglycemia and you cannot eat or drink, you will need to be given glucagon. A family member or close friend should learn how to check your blood glucose and how to give you glucagon. Ask your health care provider if you need to have an  emergency glucagon kit available. Severe hypoglycemia may need to be treated in a hospital. The treatment may include getting glucose through an IV. You may also need treatment for the cause of your hypoglycemia. Follow these instructions at home:  General instructions Take over-the-counter and prescription medicines only as told by your health care provider. Monitor your blood glucose as told by your health care provider. If you drink alcohol: Limit how much you have to: 0-1 drink a day for women who are not pregnant. 0-2 drinks a day for men. Know how much alcohol is in your drink. In the [place] one drink equals one 12 oz bottle of beer (355 mL), one 5 oz glass of wine (148 mL), or one 1 oz glass of hard liquor (44 mL). Be sure to eat food along with drinking alcohol. Be aware that alcohol is absorbed quickly and may have lingering effects that may result in hypoglycemia later. Be sure to do ongoing glucose monitoring. Keep all follow-up visits. This is important. If you have diabetes: Always have a fast-acting carbohydrate (15 grams) option with you to treat  low blood glucose. Follow your diabetes management plan as directed by your health care provider. Make sure you: Know the symptoms of hypoglycemia. It is important to treat it right away to prevent it from becoming severe. Check your blood glucose as often as told. Always check before and after exercise. Always check your blood glucose before you drive a motorized vehicle. Take your medicines as told. Follow your meal plan. Eat on time, and do not skip meals. Share your diabetes management plan with people in your workplace, school, and household. Carry a medical alert card or wear medical alert jewelry. Where to find more information American Diabetes Association: www.diabetes.org Contact a health care provider if: You have problems keeping your blood glucose in your target range. You have frequent episodes of hypoglycemia. Get  help right away if: You continue to have hypoglycemia symptoms after eating or drinking something that contains 15 grams of fast-acting carbohydrate, and you cannot get your blood glucose above 70 mg/dL (3.9 mmol/L) while following the 15:15 rule. Your blood glucose is below 54 mg/dL (3 mmol/L). You have a seizure. You faint. These symptoms may represent a serious problem that is an emergency. Do not wait to see if the symptoms will go away. Get medical help right away. Call your local emergency services (911 in the [place] Do not drive yourself to the hospital. Summary Hypoglycemia occurs when the level of sugar (glucose) in the blood is too low. Hypoglycemia can happen in people who have or do not have diabetes. It can develop quickly, and it can be a medical emergency. Make sure you know the symptoms of hypoglycemia and how to treat it. Always have a fast-acting carbohydrate option with you to treat low blood sugar. This information is not intended to replace advice given to you by your health care provider. Make sure you discuss any questions you have with your health care provider. Document Revised: 01/03/2020 Document Reviewed: 01/03/2020 Elsevier Patient Education  [place] next appointment is by telephone on 08/11/21 at 3 pm  Please call the care guide team at [phone] if you need to cancel or reschedule your appointment.   If you are experiencing a Mental Health or [place] or need someone to talk to, please call the Suicide and Crisis Lifeline: 988 call the [place] National Suicide Prevention Lifeline: 360-043-8096 or TTY: 220-231-8799 TTY 717-118-0065) to talk to a trained counselor call 1-800-273-TALK (toll free, 24 hour hotline) go to [facility] [address] [place] [phone]) call 911   Following is a copy of your full plan of care:  Care Plan : RN Care Manager Plan of Care  Updates made by  Kassie Mends, RN since 07/02/2021 12:00 AM     Problem: No plan of care established for management of chronic disease state (DM2, HTN)   Priority: High     Long-Range Goal: Development of plan of care for chronic disease management  (DM2, HTN)   Start Date: 07/02/2021  Expected End Date: 12/29/2021  Priority: High  Note:   Current Barriers:  Knowledge Deficits related to plan of care for management of HTN and DMII  Chronic Disease Management support and education needs related to HTN and DMII Spoke with patient's sister [name] DPR who reports patient now lives with her adult son and he has worked very hard helping his mother follow carbohydrate modified diet and AIC has been reduced significantly, CBG is checked BID with fasting  ranges 80-120 and random ranges 130-140 with readings sometimes near 200, blood pressure is checked on occasion and readings have been within normal limits.  Patient does require assistance with bathing, dressing, medication oversight, etc and family assists her but it is difficult to get patient out of the house. Home health RN and PT recently discharged patient. Family is requesting Palliative Care in [place] be contacted as they are interested in services for patient, pt has needed DME in the home including wheelchair but does not have a ramp, family agreeable to a call from care guide for resources.  RNCM Clinical Goal(s):  Patient will verbalize understanding of plan for management of HTN and DMII as evidenced by patient, caregiver report, review of EHR and  through collaboration with RN Care manager, provider, and care team.   Interventions: 1:1 collaboration with primary care provider regarding development and update of comprehensive plan of care as evidenced by provider attestation and co-signature Inter-disciplinary care team collaboration (see longitudinal plan of care) Evaluation of current treatment plan related to  self management and  patient's adherence to plan as established by provider   Diabetes Interventions:  (Status:  New goal. and Goal on track:  Yes.) Long Term Goal Assessed patient's understanding of A1c goal: <7% Provided education to patient about basic DM disease process Reviewed medications with patient and discussed importance of medication adherence Counseled on importance of regular laboratory monitoring as prescribed Discussed plans with patient for ongoing care management follow up and provided patient with direct contact information for care management team Review of patient status, including review of consultants reports, relevant laboratory and other test results, and medications completed Screening for signs and symptoms of depression related to chronic disease state  Assessed social determinant of health barriers Reviewed carbohydrate modified diet Education mailed- hypoglycemia Telephone call to [facility] [place], left voicemail with Judie Grieve reporting patient and family interested in palliative care services, left contact information for RN care manager and patient Lab Results  Component Value Date   HGBA1C 7.0 11/13/2020   Hypertension Interventions:  (Status:  New goal. and Goal on track:  Yes.) Long Term Goal Last practice recorded BP readings:  BP Readings from Last 3 Encounters:  08/14/20 139/79  01/10/20 (!) 158/82  11/28/19 138/84  Most recent eGFR/CrCl: No results found for: EGFR  No components found for: CRCL  Evaluation of current treatment plan related to hypertension self management and patient's adherence to plan as established by provider Reviewed medications with patient and discussed importance of compliance Advised patient, providing education and rationale, to monitor blood pressure daily and record, calling PCP for findings outside established parameters Discussed complications of poorly controlled blood pressure such as heart disease, stroke,  circulatory complications, vision complications, kidney impairment, sexual dysfunction Education mailed- low sodium diet  Referral placed for care guide for resources wheel chair ramp  Patient Goals/Self-Care Activities: Take medications as prescribed   Attend all scheduled provider appointments Call pharmacy for medication refills 3-7 days in advance of running out of medications Call provider office for new concerns or questions  check blood sugar at prescribed times: twice daily check feet daily for cuts, sores or redness enter blood sugar readings and medication or insulin into daily log take the blood sugar log to all doctor visits take the blood sugar meter to all doctor visits fill half of plate with vegetables check blood pressure weekly choose a place to take my blood pressure (home, clinic or office,  retail store) write blood pressure results in a log or diary take blood pressure log to all doctor appointments take medications for blood pressure exactly as prescribed eat more whole grains, fruits and vegetables, lean meats and healthy fats Follow carbohydrate modified diet Care guide will contact you about resources for wheel chair ramp Palliative care of [place] was contacted, be expecting a call from them Look over education mailed- hypoglycemia and low sodium diet        Ms. Gailey was given information about Care Management services by the embedded care coordination team including:  Care Management services include personalized support from designated clinical staff supervised by her physician, including individualized plan of care and coordination with other care providers 24/7 contact phone numbers for assistance for urgent and routine care needs. The patient may stop CCM services at any time (effective at the end of the month) by phone call to the office staff.  Patient agreed to services and verbal consent obtained.   The patient verbalized understanding  of instructions, educational materials, and care plan provided today and agreed to receive a mailed copy of patient instructions, educational materials, and care plan.   Telephone follow up appointment with care management team member scheduled for: 08/11/21 at 3 pm  Jacqlyn Larsen [facility], BSN RN Case Manager [facility] Primary [facility] [phone]

## 2021-07-02 NOTE — Chronic Care Management (AMB) (Signed)
Care Management    RN Visit Note  07/02/2021 Name: [name] MRN: [id number] DOB: November 10, 1940  Subjective: [name] is a 81 y.o. year old female who is a primary care patient of [name] Helper, MD. The care management team was consulted for assistance with disease management and care coordination needs.    Engaged with patient by telephone for initial visit in response to provider referral for case management and/or care coordination services.   Consent to Services:   [name] was given information about Care Management services today including:  Care Management services includes personalized support from designated clinical staff supervised by her physician, including individualized plan of care and coordination with other care providers 24/7 contact phone numbers for assistance for urgent and routine care needs. The patient may stop case management services at any time by phone call to the office staff.  Patient agreed to services and consent obtained.   Assessment: Review of patient past medical history, allergies, medications, health status, including review of consultants reports, laboratory and other test data, was performed as part of comprehensive evaluation and provision of chronic care management services.   SDOH (Social Determinants of Health) assessments and interventions performed:  SDOH Interventions    Flowsheet Row Most Recent Value  SDOH Interventions   Food Insecurity Interventions Intervention Not Indicated  Transportation Interventions Intervention Not Indicated        Care Plan  Allergies  Allergen Reactions   Penicillins Itching    Did it involve swelling of the face/tongue/throat, SOB, or low BP? No Did it involve sudden or severe rash/hives, skin peeling, or any reaction on the inside of your mouth or nose? No Did you need to seek medical attention at a hospital or doctor's office? No When did it last happen?  many years ago    If all  above answers are "NO", may proceed with cephalosporin use.     Fentanyl Itching   Sulfonamide Derivatives Hives   Sulfur    Codeine Itching and Rash    tussionex  Is tolerated by patient, no phenergan dm     Outpatient Encounter Medications as of 07/02/2021  Medication Sig Note   ACCU-CHEK AVIVA PLUS test strip USE TO TEST BLOOD SUGAR TWICE DAILY.    acetaminophen (TYLENOL) 500 MG tablet Take one tablet two times daily for chronic pain (Patient taking differently: Take 500 mg by mouth in the morning and at bedtime. for chronic pain)    albuterol (VENTOLIN HFA) 108 (90 Base) MCG/ACT inhaler Inhale 2 puffs into the lungs every 6 (six) hours as needed for wheezing or shortness of breath.    amLODipine (NORVASC) 10 MG tablet TAKE 1 TABLET BY MOUTH ONCE DAILY.    atorvastatin (LIPITOR) 40 MG tablet TAKE 1 TABLET BY MOUTH ONCE DAILY.    BD INSULIN SYRINGE U/F 31G X 5/16" 1 ML MISC USE AS DIRECTED    busPIRone (BUSPAR) 7.5 MG tablet TAKE (1) TABLET BY MOUTH THREE TIMES DAILY    cloNIDine (CATAPRES) 0.2 MG tablet TAKE ONE TABLET BY MOUTH AT BEDTIME.    clotrimazole-betamethasone (LOTRISONE) cream Apply 1 application topically 2 (two) times daily. D/c vusion ointment    COMBIGAN 0.2-0.5 % ophthalmic solution Place 1 drop into both eyes 2 (two) times daily.    dabigatran (PRADAXA) 150 MG CAPS capsule TAKE (1) CAPSULE BY MOUTH EVERY TWELVE HOURS.    docusate sodium (COLACE) 100 MG capsule Take 1 capsule (100 mg total) by mouth daily  as needed for mild constipation. (Patient taking differently: Take 100 mg by mouth daily.)    donepezil (ARICEPT) 10 MG tablet Take 1 tablet (10 mg total) by mouth at bedtime.    hydrOXYzine (VISTARIL) 25 MG capsule TAKE (1) CAPSULE BY MOUTH THREE TIMES DAILY    imipramine (TOFRANIL) 50 MG tablet TAKE 2 TABLETS BY MOUTH AT BEDTIME    Insulin Syringe-Needle U-100 32G X 5/16" 1 ML MISC 1 each by Does not apply route in the morning and at bedtime. Use to inject insulin twice  daily dx e11.65    meclizine (ANTIVERT) 25 MG tablet Take 1 tablet (25 mg total) by mouth 3 (three) times daily as needed for dizziness. 01/29/2021: Pt sister says she needs a refill   montelukast (SINGULAIR) 10 MG tablet TAKE ONE TABLET BY MOUTH AT BEDTIME.    NOVOLIN 70/30 (70-30) 100 UNIT/ML injection INJECT 55 UNITS S.Q. TWICE DAILY WITH A MEAL.    nystatin (MYCOSTATIN/NYSTOP) powder APPLY TOPCIALLY TO RASH UNDER BREASTS ONCE DAILY AS NEEDED.    olopatadine (PATANOL) 0.1 % ophthalmic solution Place 1 drop into both eyes 2 (two) times daily. 11/28/2019: Needs refill    omeprazole (PRILOSEC) 40 MG capsule TAKE (1) CAPSULE BY MOUTH TWICE DAILY.    oxyCODONE-acetaminophen (PERCOCET) 10-325 MG tablet Take one tablet by mouth two times daily for pain    SUMAtriptan (IMITREX) 25 MG tablet TAKE 1 TABLET BY MOUTH AS NEEDED FOR MIGRAINE. MAY REPEAT IN 2 HOURS IF HEADACHE PERSISTS OR RECURS.    topiramate (TOPAMAX) 100 MG tablet TAKE 1 TABLET BY MOUTH TWICE DAILY    ULTICARE MINI PEN NEEDLES 31G X 6 MM MISC USE TO INJECT NOVOLIN TWICE DAILY.    UNABLE TO FIND Standard wheelchair with elevating leg rests DX M25.561, M54.41    oxyCODONE-acetaminophen (PERCOCET) 10-325 MG tablet Take one tablet by mouth twice daily for chronic pain (Patient not taking: Reported on 07/02/2021)    oxyCODONE-acetaminophen (PERCOCET) 10-325 MG tablet Take one tablet by mouth twice daily for chronic pain (Patient not taking: Reported on 07/02/2021)    oxyCODONE-acetaminophen (PERCOCET) 10-325 MG tablet Take one tablet by mouth twice daily for chronic pain (Patient not taking: Reported on 07/02/2021)    oxyCODONE-acetaminophen (PERCOCET) 10-325 MG tablet Take one tablet by mouth twice daily for pain (Patient not taking: Reported on 07/02/2021)    oxyCODONE-acetaminophen (PERCOCET) 10-325 MG tablet Take one tablet by mouth two times daily for pain (Patient not taking: Reported on 07/02/2021)    [START ON 07/19/2021]  oxyCODONE-acetaminophen (PERCOCET) 10-325 MG tablet Take one tablet by moth two times daily for pain (Patient not taking: Reported on 07/02/2021)    UNABLE TO FIND Skilled Nursing to eval and treat as indicated. Will need routine lab work and vital sign assessments. (Patient not taking: Reported on 07/02/2021)    [place] to evaluate and treat as indicated. 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) 04/20/2019   Esophageal dysphagia 01/26/2019   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   History of pulmonary embolism 01/25/2018   Encounter for chronic pain management 07/17/2016   Chronic pain syndrome 02/03/2016   Nausea 09/03/2014  Bilateral knee pain 04/23/2014   Type 2 diabetes mellitus with neurological complications ([place]) 15/06/6977   Right-sided low back pain with right-sided sciatica 07/02/2013   Seasonal allergies 04/11/2013   Urinary incontinence 48/02/6551   Metabolic syndrome X 74/82/7078   Chronic anticoagulation 12/22/2012   Vitamin D deficiency 11/05/2012   Memory loss 11/01/2012   Depression with anxiety 11/01/2012   Anemia, iron deficiency    Morbid obesity ([place]) 08/16/2008   Globus pharyngeus 01/26/2008   Hyperlipidemia LDL goal <100 05/30/2007   Migraine 05/30/2007   Essential hypertension 05/30/2007   Gastroesophageal reflux disease 05/30/2007    Conditions to be addressed/monitored: HTN and DMII  Care Plan : RN Care Manager Plan of Care  Updates made by Kassie Mends, RN since 07/02/2021 12:00 AM     Problem: No plan of care established for management of chronic disease state (DM2, HTN)   Priority:  High     Long-Range Goal: Development of plan of care for chronic disease management  (DM2, HTN)   Start Date: 07/02/2021  Expected End Date: 12/29/2021  Priority: High  Note:   Current Barriers:  Knowledge Deficits related to plan of care for management of HTN and DMII  Chronic Disease Management support and education needs related to HTN and DMII Spoke with patient's sister [name] DPR who reports patient now lives with her adult son and he has worked very hard helping his mother follow carbohydrate modified diet and AIC has been reduced significantly, CBG is checked BID with fasting ranges 80-120 and random ranges 130-140 with readings sometimes near 200, blood pressure is checked on occasion and readings have been within normal limits.  Patient does require assistance with bathing, dressing, medication oversight, etc and family assists her but it is difficult to get patient out of the house. Home health RN and PT recently discharged patient. Family is requesting Palliative Care in [place] be contacted as they are interested in services for patient, pt has needed DME in the home including wheelchair but does not have a ramp, family agreeable to a call from care guide for resources.  RNCM Clinical Goal(s):  Patient will verbalize understanding of plan for management of HTN and DMII as evidenced by patient, caregiver report, review of EHR and  through collaboration with RN Care manager, provider, and care team.   Interventions: 1:1 collaboration with primary care provider regarding development and update of comprehensive plan of care as evidenced by provider attestation and co-signature Inter-disciplinary care team collaboration (see longitudinal plan of care) Evaluation of current treatment plan related to  self management and patient's adherence to plan as established by provider   Diabetes Interventions:  (Status:  New goal. and Goal on track:  Yes.) Long Term Goal Assessed  patient's understanding of A1c goal: <7% Provided education to patient about basic DM disease process Reviewed medications with patient and discussed importance of medication adherence Counseled on importance of regular laboratory monitoring as prescribed Discussed plans with patient for ongoing care management follow up and provided patient with direct contact information for care management team Review of patient status, including review of consultants reports, relevant laboratory and other test results, and medications completed Screening for signs and symptoms of depression related to chronic disease state  Assessed social determinant of health barriers Reviewed carbohydrate modified diet Education mailed- hypoglycemia Telephone call to [facility], left voicemail with Judie Grieve reporting patient and family interested in palliative care services, left contact information for  RN care manager and patient Lab Results  Component Value Date   HGBA1C 7.0 11/13/2020   Hypertension Interventions:  (Status:  New goal. and Goal on track:  Yes.) Long Term Goal Last practice recorded BP readings:  BP Readings from Last 3 Encounters:  08/14/20 139/79  01/10/20 (!) 158/82  11/28/19 138/84  Most recent eGFR/CrCl: No results found for: EGFR  No components found for: CRCL  Evaluation of current treatment plan related to hypertension self management and patient's adherence to plan as established by provider Reviewed medications with patient and discussed importance of compliance Advised patient, providing education and rationale, to monitor blood pressure daily and record, calling PCP for findings outside established parameters Discussed complications of poorly controlled blood pressure such as heart disease, stroke, circulatory complications, vision complications, kidney impairment, sexual dysfunction Education mailed- low sodium diet  Referral placed for care guide  for resources wheel chair ramp  Patient Goals/Self-Care Activities: Take medications as prescribed   Attend all scheduled provider appointments Call pharmacy for medication refills 3-7 days in advance of running out of medications Call provider office for new concerns or questions  check blood sugar at prescribed times: twice daily check feet daily for cuts, sores or redness enter blood sugar readings and medication or insulin into daily log take the blood sugar log to all doctor visits take the blood sugar meter to all doctor visits fill half of plate with vegetables check blood pressure weekly choose a place to take my blood pressure (home, clinic or office, retail store) write blood pressure results in a log or diary take blood pressure log to all doctor appointments take medications for blood pressure exactly as prescribed eat more whole grains, fruits and vegetables, lean meats and healthy fats Follow carbohydrate modified diet Care guide will contact you about resources for wheel chair ramp Palliative care of [place] was contacted, be expecting a call from them Look over education mailed- hypoglycemia and low sodium diet        Plan: Telephone follow up appointment with care management team member scheduled for:  08/11/21  Jacqlyn Larsen [facility], BSN RN Case Manager [facility] [phone]

## 2021-07-02 NOTE — Telephone Encounter (Signed)
   Telephone encounter was:  Successful.  07/02/2021 Name: BRYTTANI BLEW MRN: [id number] DOB: 01-Feb-1941  [name] is a 81 y.o. year old female who is a primary care patient of Moshe Cipro Norwood Levo, MD . The community resource team was consulted for assistance with  ramp  Care guide performed the following interventions: Patient provided with information about care guide support team and interviewed to confirm resource needs.  Follow Up Plan:  No further follow up planned at this time. The patient has been provided with needed resources.   [place], Care Management  [phone] [address], [place], [place] [address] Phone: [phone] Email: [email or web address]

## 2021-07-04 ENCOUNTER — Other Ambulatory Visit: Payer: Self-pay | Admitting: Family Medicine

## 2021-07-17 ENCOUNTER — Telehealth: Payer: Self-pay

## 2021-07-17 NOTE — Telephone Encounter (Signed)
[name] called from [facility] palliative care, she received a palliative referral from case manager at hospital to follow up in the home for patient care. Needs approval if okay. Stacy call back # [phone] option 2

## 2021-07-21 ENCOUNTER — Telehealth: Payer: Self-pay | Admitting: Family Medicine

## 2021-07-21 NOTE — Telephone Encounter (Signed)
Good Morning [name], can you help with getting this pt into Palliative care? I had received a direct request from them as a flag some time ago and was waiting to speak with family. Her sister [name] is with me and confirms that they are very interested and want this as pt is homebound with multiple chronic debilitating illnesses

## 2021-07-22 ENCOUNTER — Other Ambulatory Visit: Payer: Self-pay | Admitting: Family Medicine

## 2021-07-22 NOTE — Telephone Encounter (Signed)
Spoke with Marzetta Board with [facility] they will be sending out palliative care for patient.

## 2021-07-24 ENCOUNTER — Encounter: Payer: Self-pay | Admitting: Family Medicine

## 2021-07-24 ENCOUNTER — Ambulatory Visit (INDEPENDENT_AMBULATORY_CARE_PROVIDER_SITE_OTHER): Payer: Medicare HMO | Admitting: Family Medicine

## 2021-07-24 ENCOUNTER — Ambulatory Visit: Payer: Medicare HMO | Admitting: Family Medicine

## 2021-07-24 DIAGNOSIS — E1149 Type 2 diabetes mellitus with other diabetic neurological complication: Secondary | ICD-10-CM | POA: Diagnosis not present

## 2021-07-24 DIAGNOSIS — Z742 Need for assistance at home and no other household member able to render care: Secondary | ICD-10-CM | POA: Diagnosis not present

## 2021-07-24 DIAGNOSIS — E785 Hyperlipidemia, unspecified: Secondary | ICD-10-CM | POA: Diagnosis not present

## 2021-07-24 DIAGNOSIS — M159 Polyosteoarthritis, unspecified: Secondary | ICD-10-CM

## 2021-07-24 DIAGNOSIS — G8929 Other chronic pain: Secondary | ICD-10-CM

## 2021-07-24 DIAGNOSIS — M5441 Lumbago with sciatica, right side: Secondary | ICD-10-CM

## 2021-07-24 NOTE — Progress Notes (Unsigned)
Virtual Visit via Telephone Note  I connected with  [name] , sister of [name] on 07/24/21 at 11:00 AM EDT by telephone and verified that I am speaking with the correct person using two identifiers.  Location: Patient: *** Provider: ***   I discussed the limitations, risks, security and privacy concerns of performing an evaluation and management service by telephone and the availability of in person appointments. I also discussed with the patient that there may be a patient responsible charge related to this service. The patient expressed understanding and agreed to proceed.   History of Present Illness:   Blood sugar generally doing, fBG running, 106, 112. 109, 115 , 99, evening sugars, 181. 189. 141, 227, 155 Moves from bed to chair to bedside commode C/o a lot of pain and stiffness in knees and back 1 month ago attempt at getting her a tub bath, slipped out of bath chanir Observations/Objective: BP  unknown  Assessment and Plan:   Follow Up Instructions:    I discussed the assessment and treatment plan with the patient. The patient was provided an opportunity to ask questions and all were answered. The patient agreed with the plan and demonstrated an understanding of the instructions.   The patient was advised to call back or seek an in-person evaluation if the symptoms worsen or if the condition fails to improve as anticipated.  I provided *** minutes of non-face-to-face time during this encounter.   Tula Nakayama, MD

## 2021-07-24 NOTE — Patient Instructions (Signed)
F/U I   14 weeks, call if you need me sooner  Nurse please refer for I'm home PT twice weekly x 4 weeks , and also for nurse visit for general check and lab draw,  please let me know if problems getting this done  No changes in medication  Be carefiul not to fall  Thanks for choosing [facility], we consider it a privelige to serve you.

## 2021-07-28 ENCOUNTER — Ambulatory Visit: Payer: Medicare HMO | Admitting: Family Medicine

## 2021-07-28 ENCOUNTER — Other Ambulatory Visit: Payer: Self-pay | Admitting: Family Medicine

## 2021-08-09 ENCOUNTER — Telehealth: Payer: Self-pay | Admitting: Family Medicine

## 2021-08-09 ENCOUNTER — Encounter: Payer: Self-pay | Admitting: Family Medicine

## 2021-08-09 MED ORDER — OXYCODONE-ACETAMINOPHEN 10-325 MG PO TABS: ORAL_TABLET | ORAL | 0 refills | Status: DC

## 2021-08-09 NOTE — Assessment & Plan Note (Signed)
Chronic pain, high fall risk, generalized  Stiffness and econditioning fromhistort provided , needs PT/OT

## 2021-08-09 NOTE — Assessment & Plan Note (Signed)
The patient's Controlled Substance registry is reviewed and compliance confirmed. Adequacy of  Pain control and level of function is assessed. Medication dosing is adjusted as deemed appropriate. Twelve weeks of medication is prescribed , with a follow up appointment between 11 to 12 weeks .  

## 2021-08-10 ENCOUNTER — Other Ambulatory Visit: Payer: Self-pay | Admitting: Family Medicine

## 2021-08-11 ENCOUNTER — Other Ambulatory Visit: Payer: Self-pay | Admitting: Family Medicine

## 2021-08-11 ENCOUNTER — Ambulatory Visit: Payer: Medicare HMO | Admitting: *Deleted

## 2021-08-11 ENCOUNTER — Telehealth: Payer: Self-pay | Admitting: Primary Care

## 2021-08-11 DIAGNOSIS — E1149 Type 2 diabetes mellitus with other diabetic neurological complication: Secondary | ICD-10-CM

## 2021-08-11 DIAGNOSIS — I10 Essential (primary) hypertension: Secondary | ICD-10-CM

## 2021-08-11 DIAGNOSIS — Z0289 Encounter for other administrative examinations: Secondary | ICD-10-CM

## 2021-08-12 ENCOUNTER — Telehealth (INDEPENDENT_AMBULATORY_CARE_PROVIDER_SITE_OTHER): Payer: Medicare HMO | Admitting: Family Medicine

## 2021-08-12 ENCOUNTER — Other Ambulatory Visit: Payer: Medicare HMO | Admitting: Primary Care

## 2021-08-12 DIAGNOSIS — E1149 Type 2 diabetes mellitus with other diabetic neurological complication: Secondary | ICD-10-CM | POA: Diagnosis not present

## 2021-08-12 DIAGNOSIS — F418 Other specified anxiety disorders: Secondary | ICD-10-CM | POA: Diagnosis not present

## 2021-08-12 DIAGNOSIS — Z515 Encounter for palliative care: Secondary | ICD-10-CM

## 2021-08-12 DIAGNOSIS — R2681 Unsteadiness on feet: Secondary | ICD-10-CM | POA: Diagnosis not present

## 2021-08-12 DIAGNOSIS — Z66 Do not resuscitate: Secondary | ICD-10-CM | POA: Insufficient documentation

## 2021-08-12 DIAGNOSIS — M159 Polyosteoarthritis, unspecified: Secondary | ICD-10-CM

## 2021-08-12 DIAGNOSIS — R413 Other amnesia: Secondary | ICD-10-CM

## 2021-08-12 NOTE — Progress Notes (Signed)
[facility] Palliative Care Consult Note Telephone: [phone]  Fax: [phone]   Date of encounter: 08/12/21 2:24 PM PATIENT NAME: [name] [address] [place] [address]   [phone] (home)  DOB: 1940-10-13 MRN: [id number] PRIMARY CARE PROVIDER:    [name], MD,  [address], [place] [address] [phone]  REFERRING PROVIDER:   [name], MD [address] [place],  [place] [address] [phone]  RESPONSIBLE PARTY:    Contact Information     Name Relation Home Work [place] Sister   [phone]   Douglas,[name] Daughter [phone]  [phone]   [place] Ranger   [phone]       I met face to face with patient and family in  home. Palliative Care was asked to follow this patient by consultation request of  [name] Helper, MD to address advance care planning and complex medical decision making. This is the initial visit.                                     ASSESSMENT AND PLAN / RECOMMENDATIONS:   Advance Care Planning/Goals of Care: Goals include to maximize quality of life and symptom management. Patient/health care surrogate gave his/her permission to discuss.Our advance care planning conversation included a discussion about:    The value and importance of advance care planning  Experiences with loved ones who have been seriously ill or have died  Exploration of personal, cultural or spiritual beliefs that might influence medical decisions  Exploration of goals of care in the event of a sudden injury or illness  Identification of a healthcare agent - Sister and son Review and  creation of an  advance directive document . Decision not to resuscitate due to poor prognosis. CODE STATUS: DNR  I completed a MOST form today. The patient and family outlined their wishes for the following treatment decisions:  Cardiopulmonary Resuscitation: Do Not  Attempt Resuscitation (DNR/No CPR)  Medical Interventions: Limited Additional Interventions: Use medical treatment, IV fluids and cardiac monitoring as indicated, DO NOT USE intubation or mechanical ventilation. May consider use of less invasive airway support such as BiPAP or CPAP. Also provide comfort measures. Transfer to the hospital if indicated. Avoid intensive care.   Antibiotics: Antibiotics if indicated  IV Fluids: IV fluids for a defined trial period  Feeding Tube: No feeding tube    Symptom Management/Plan:    I met with patient and her family in their home. She lives with her son and her sister is also part of her caregiving team. Patient has become more and more homebound over the last several years. The main concern they were seeking was supportive care, in-home for lab draws, and other Primary management. We discussed primary provider in home services, but they are very devoted to their physician and do not want to entertain changing. We discussed her mentation which is progressing in her dementia. That said she participated in goals of care conversation which she was very clear and determined as to her wishes. I do feel she had capacity and her family  reiterated that her decisions had been consistently voiced over many years. We formulated a most and a DNR today which I've left in the home.   Re In home labs, there are no [facility] services covering their zip code. Our RN may  be able to draw when she is in the area again next month.    Mobility: limited, pivots to bsc and often wihtout calling for assistance. Has a standard walker, sleeps in chair  not bed.  Pain:  Endorses chronic, but is managed currently with less mobility and pcp prescribes controlled pain relievers.  Memory loss: endorses progressive loss by family, increasing over past 2 years. She is able to converse however and make her wishes known RE ACP. She is the oldest sister and her youngest is also a caregiver to  her.  Skin break down: Healed for now, discussed Incontinence induced breakdown.  Nutrition:  Eating well, A1C = 7 % in September 2022.  Son is checking capillary blood periodically and states values are in target range.   Follow up Palliative Care Visit: Palliative care will continue to follow for complex medical decision making, advance care planning, and clarification of goals. Return 6-8 weeks or prn.  I spent 60 minutes providing this consultation. More than 50% of the time in this consultation was spent in counseling and care coordination.  PPS: 30%  HOSPICE ELIGIBILITY/DIAGNOSIS: TBD  Chief Complaint: debility, immobility  HISTORY OF PRESENT ILLNESS:  [name] is a 81 y.o. year old female  with debility, immobility, diabetes, obesity, memory loss . Patient seen today to review palliative care needs to include medical decision making and advance care planning as appropriate.   History obtained from review of EMR, discussion with primary team, and interview with family, facility staff/caregiver and/or [name].  I reviewed available labs, medications, imaging, studies and related documents from the EMR.  Records reviewed and summarized above.   ROS   General: NAD ENMT: denies dysphagia Pulmonary: denies cough, denies increased SOB Abdomen: endorses good appetite, denies constipation, endorses continence of bowel GU: denies dysuria, endorses incontinence of urine MSK:  endorses  increased weakness, no falls reported Skin: denies rashes or wounds Neurological: endorses chronic  pain, denies insomnia Psych: Endorses positive mood Heme/lymph/immuno: denies bruises, abnormal bleeding  Physical Exam: Current and past weights:unavailable Constitutional: NAD General: frail appearing EYES: anicteric sclera, lids intact, no discharge arcus  senilis, bil.  ENMT: intact hearing, oral mucous membranes moist CV: S1S2, RRR, no LE edema Pulmonary: LCTA, no increased work of  breathing, no cough, room air Abdomen: intake 100%, normo-active BS + 4 quadrants, soft and non tender, no ascites GU: deferred MSK: mild sarcopenia, moves all extremities,  min. Ambulatory with fall risk. Skin: warm and dry, no rashes or wounds on visible skin Neuro:  + generalized weakness,  + cognitive impairment Psych: non-anxious affect, A and O x 2-3 Hem/lymph/immuno: no widespread bruising  CURRENT PROBLEM LIST:  Patient Active Problem List   Diagnosis Date Noted   Need for home health care 08/27/2020   Pain management contract discussed 03/15/2020   CKD stage G3b/A1, 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) 04/20/2019   Esophageal dysphagia 01/26/2019   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   History of pulmonary embolism 01/25/2018   Encounter for chronic pain management 07/17/2016   Chronic pain syndrome 02/03/2016   Nausea 09/03/2014   Bilateral knee pain 04/23/2014   Type 2 diabetes mellitus with neurological complications ([place]) 88/32/5498   Right-sided low back pain with right-sided sciatica 07/02/2013   Seasonal allergies 04/11/2013   Urinary incontinence 26/41/5830   Metabolic syndrome X 94/08/6806   Chronic anticoagulation 12/22/2012   Vitamin D deficiency 11/05/2012   Memory loss 11/01/2012   Depression with anxiety 11/01/2012  Anemia, iron deficiency    Morbid obesity ([place]) 08/16/2008   Globus pharyngeus 01/26/2008   Hyperlipidemia LDL goal <100 05/30/2007   Migraine 05/30/2007   Essential hypertension 05/30/2007   Gastroesophageal reflux disease 05/30/2007   PAST MEDICAL HISTORY:  Active Ambulatory Problems    Diagnosis Date Noted   Hyperlipidemia LDL goal <100 05/30/2007   Morbid obesity ([place]) 08/16/2008   Migraine 05/30/2007   Essential hypertension 05/30/2007   Gastroesophageal reflux disease 05/30/2007   Globus pharyngeus 01/26/2008   Anemia, iron deficiency    Memory loss 11/01/2012   Depression with anxiety 11/01/2012   Vitamin  D deficiency 82/95/6213   Metabolic syndrome X 08/65/7846   Chronic anticoagulation 12/22/2012   Seasonal allergies 04/11/2013   Urinary incontinence 04/11/2013   Right-sided low back pain with right-sided sciatica 07/02/2013   Type 2 diabetes mellitus with neurological complications ([place]) 96/29/5284   Bilateral knee pain 04/23/2014   Nausea 09/03/2014   Chronic pain syndrome 02/03/2016   Encounter for chronic pain management 07/17/2016   History of pulmonary embolism 01/25/2018   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   Esophageal dysphagia 01/26/2019   CKD stage G3b/A1, GFR 30-44 and albumin creatinine ratio <30 mg/g ([place]) 04/20/2019   Pain management contract discussed 03/15/2020   Need for home health care 08/27/2020   Resolved Ambulatory Problems    Diagnosis Date Noted   H/O diabetes mellitus 02/20/2008   DEMENTIA 05/30/2007   DEPRESSION 02/26/2008   BLURRED VISION, INTERMITTENT 11/25/2009   Otitis media 06/09/2009   TMJ SYNDROME 06/09/2009   Acute cystitis 09/03/2009   Pruritus 09/03/2009   OSTEOARTHRITIS, KNEE, LEFT 06/09/2009   INTERMITTENT VERTIGO 06/09/2009   FATIGUE 06/05/2009   UNSTEADY GAIT 11/25/2009   Insomnia 07/17/2010   Pulmonary embolism ([place]) 07/21/2010   Chronic anticoagulation 07/21/2010   Chest pain 12/08/2010   Vertigo 12/08/2010   Cystitis, acute 03/13/2011   Insomnia 08/06/2011   Anxiety 11/23/2011   Acute bronchitis 03/28/2012   Acute maxillary sinusitis 03/30/2012   Cystitis 07/18/2012   IGT (impaired glucose tolerance) 11/01/2012   Left knee pain 11/01/2012   UTI (urinary tract infection) 12/20/2012   Cough 04/11/2013   Neck muscle spasm 04/11/2013   Chest pain, unspecified 04/11/2013   Routine general medical examination at a health care facility 05/20/2013   Acute bronchitis 11/15/2013   Acute sinusitis 11/15/2013   History of recent fall 11/17/2013   Headache disorder 04/23/2014   Need for vaccination with 13-polyvalent  pneumococcal conjugate vaccine 05/12/2014   Chronic bronchitis ([place]) 10/31/2014   Chronic sinusitis 11/01/2014   Acute cystitis without hematuria 11/18/2014   Intermittent vertigo 11/18/2014   Shoulder pain, right 11/18/2014   Acute bronchitis 03/07/2015   Acute cystitis without hematuria 04/28/2015   Annual physical exam 05/29/2015   UTI (urinary tract infection) 05/29/2015   Encounter for medication management 10/29/2015   Candidiasis of breast 10/29/2015   Medicare annual wellness visit, subsequent 01/18/2016   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 coli UTI/complicated UTI with nephrolithiasis 02/26/2018   Hydronephrosis with renal and ureteral calculus obstruction 02/26/2018   UTI (urinary tract infection) 03/17/2018   Dysuria 04/12/2018   Chest pain 11/10/2018   Candidiasis of vulva  and vagina 12/08/2018   Decreased appetite 01/26/2019   Acute GI bleeding 04/20/2019   Encounter for examination following treatment at hospital 05/16/2019   Anemia 05/16/2019   Falls, initial encounter 01/09/2020   Past Medical History:  Diagnosis Date   Atrial fibrillation (HCC)    Bruises easily    Chronic back pain    Degenerative joint disease    Dementia (HCC)    Depression    Diabetes mellitus    Glaucoma    Glaucoma    History of blood transfusion 1969   History of gout    Hx of colonic polyps    Hx of migraines    Hyperlipidemia    Hypertension    Joint pain    Joint swelling    Migraine    Nocturia    Overweight(278.02)    Pneumonia 1969   Skin spots-aging    Urinary frequency    SOCIAL HX:  Social History   Tobacco Use   Smoking status: Never   Smokeless  tobacco: Never  Substance Use Topics   Alcohol use: No   FAMILY HX:  Family History  Problem Relation Age of Onset   Diabetes Mother    Hypertension Mother    Arthritis Mother    Heart failure Mother    Migraines Mother    Kidney failure Mother    Leukemia Father    Migraines Father    Kidney failure Father    Diabetes Sister    Hypertension Sister    Diabetes Brother    Hypertension Brother    Hypertension Brother    Hypertension Brother    Hypertension Brother    Diabetes Brother    Diabetes Brother    Diabetes Brother    Pulmonary embolism Sister    Migraines Sister    Kidney failure Brother    Colon cancer Neg Hx    Colon polyps Neg Hx       ALLERGIES:  Allergies  Allergen Reactions   Penicillins Itching    Did it involve swelling of the face/tongue/throat, SOB, or low BP? No Did it involve sudden or severe rash/hives, skin peeling, or any reaction on the inside of your mouth or nose? No Did you need to seek medical attention at a hospital or doctor's office? No When did it last happen?  many years ago    If all above answers are "NO", may proceed with cephalosporin use.     Fentanyl Itching   Sulfonamide Derivatives Hives   Sulfur    Codeine Itching and Rash    tussionex  Is tolerated by patient, no phenergan dm      PERTINENT MEDICATIONS:  Outpatient Encounter Medications as of 08/12/2021  Medication Sig   ACCU-CHEK AVIVA PLUS test strip USE TO TEST BLOOD SUGAR TWICE DAILY.   acetaminophen (TYLENOL) 500 MG tablet Take one tablet two times daily for chronic pain (Patient taking differently: Take 500 mg by mouth in the morning and at bedtime. for chronic pain)   albuterol (VENTOLIN HFA) 108 (90 Base) MCG/ACT inhaler Inhale 2 puffs into the lungs every 6 (six) hours as needed for wheezing or shortness of breath.   amLODipine (NORVASC) 10 MG tablet TAKE 1 TABLET BY MOUTH ONCE DAILY.   atorvastatin (LIPITOR) 40 MG tablet TAKE 1 TABLET BY MOUTH ONCE DAILY.    BD INSULIN SYRINGE U/F 31G X 5/16" 1 ML MISC USE AS DIRECTED   busPIRone (BUSPAR) 7.5 MG tablet TAKE (1) TABLET BY MOUTH THREE  TIMES DAILY   cloNIDine (CATAPRES) 0.2 MG tablet TAKE ONE TABLET BY MOUTH AT BEDTIME.   clotrimazole-betamethasone (LOTRISONE) cream Apply 1 application topically 2 (two) times daily. D/c vusion ointment   COMBIGAN 0.2-0.5 % ophthalmic solution Place 1 drop into both eyes 2 (two) times daily.   dabigatran (PRADAXA) 150 MG CAPS capsule TAKE (1) CAPSULE BY MOUTH EVERY TWELVE HOURS.   docusate sodium (COLACE) 100 MG capsule Take 1 capsule (100 mg total) by mouth daily as needed for mild constipation. (Patient taking differently: Take 100 mg by mouth daily.)   donepezil (ARICEPT) 10 MG tablet TAKE ONE TABLET BY MOUTH AT BEDTIME.   hydrOXYzine (VISTARIL) 25 MG capsule TAKE (1) CAPSULE BY MOUTH THREE TIMES DAILY   imipramine (TOFRANIL) 50 MG tablet TAKE 2 TABLETS BY MOUTH AT BEDTIME   Insulin Syringe-Needle U-100 32G X 5/16" 1 ML MISC 1 each by Does not apply route in the morning and at bedtime. Use to inject insulin twice daily dx e11.65   meclizine (ANTIVERT) 25 MG tablet Take 1 tablet (25 mg total) by mouth 3 (three) times daily as needed for dizziness.   montelukast (SINGULAIR) 10 MG tablet TAKE ONE TABLET BY MOUTH AT BEDTIME.   NOVOLIN 70/30 (70-30) 100 UNIT/ML injection INJECT 55 UNITS S.Q. TWICE DAILY WITH A MEAL.   nystatin (MYCOSTATIN/NYSTOP) powder APPLY TOPCIALLY TO RASH UNDER BREASTS ONCE DAILY AS NEEDED.   olopatadine (PATANOL) 0.1 % ophthalmic solution Place 1 drop into both eyes 2 (two) times daily.   omeprazole (PRILOSEC) 40 MG capsule TAKE (1) CAPSULE BY MOUTH TWICE DAILY.   oxyCODONE-acetaminophen (PERCOCET) 10-325 MG tablet Take one tablet by moth two times daily for pain (Patient not taking: Reported on 07/02/2021)   [START ON 08/21/2021] oxyCODONE-acetaminophen (PERCOCET) 10-325 MG tablet Take on tablet by mouth two times daily for chronic pain (Patient not  taking: Reported on 08/11/2021)   [START ON 09/20/2021] oxyCODONE-acetaminophen (PERCOCET) 10-325 MG tablet Take one tablet by mouth two times daily for chronic pain (Patient not taking: Reported on 08/11/2021)   [START ON 10/20/2021] oxyCODONE-acetaminophen (PERCOCET) 10-325 MG tablet Take one tablet by mouth two times daily for chronic pain   SUMAtriptan (IMITREX) 25 MG tablet TAKE 1 TABLET BY MOUTH AS NEEDED FOR MIGRAINE. MAY REPEAT IN 2 HOURS IF HEADACHE PERSISTS OR RECURS.   topiramate (TOPAMAX) 100 MG tablet TAKE 1 TABLET BY MOUTH TWICE DAILY   ULTICARE MINI PEN NEEDLES 31G X 6 MM MISC USE TO INJECT NOVOLIN TWICE DAILY.   UNABLE TO FIND Standard wheelchair with elevating leg rests DX M25.561, M54.41   UNABLE TO FIND Skilled Nursing to eval and treat as indicated. Will need routine lab work and vital sign assessments. (Patient not taking: Reported on 07/02/2021)   [place] to evaluate and treat as indicated. Dx: Z74.2 (Patient not taking: Reported on 07/02/2021)   Facility-Administered Encounter Medications as of 08/12/2021  Medication   fentaNYL (SUBLIMAZE) injection 25-50 mcg   Thank you for the opportunity to participate in the care of Ms. Louderback.  The palliative care team will continue to follow. Please call our office at [phone] if we can be of additional assistance.   Jason Coop, NP , DNP, AGPCNP-BC  COVID-19 PATIENT SCREENING TOOL Asked and negative response unless otherwise noted:  Have you had symptoms of covid, tested positive or been in contact with someone with symptoms/positive test in the past 5-10 days?

## 2021-08-14 ENCOUNTER — Telehealth: Payer: Self-pay | Admitting: Primary Care

## 2021-08-14 NOTE — Telephone Encounter (Signed)
T/c to POA, left message of no home visiting lab service in their area. Our RN can get out next month sometime TBD. Asked him to f/u with PCP who is the provider who is looking for labs, for further ideas. Left number for call back as needed.

## 2021-08-23 ENCOUNTER — Encounter: Payer: Self-pay | Admitting: Family Medicine

## 2021-08-23 NOTE — Patient Instructions (Addendum)
F/U as before , call if you nee me sooner  Based on visit today , you do qualify for and need in home medical service. Current and ongoing need for nursing  to regularly evaluate chronic diseases and ensure medications are being taken as prescibed, as wekll as PT/OT twice weekly for 6 weeks  Thanks for choosing [facility], we consider it a privelige to serve you.

## 2021-08-23 NOTE — Progress Notes (Signed)
Virtual Visit via Video Note  I connected with [name] on 08/12/2021 at 11:40 AM EDT by a video enabled telemedicine application and verified that I am speaking with the correct person using two identifiers.  Location: Patient: home Provider: office   I discussed the limitations of evaluation and management by telemedicine and the availability of in person appointments. The patient expressed understanding and agreed to proceed.  History of Present Illness: Face to face video enabled visit to establish homebound state of Ms [name] , and to determine her health care needs Has marked generalized joint pain and stiffnes with marked limitation in mobility, spends over 90 % of her time in bed and has challenge finding and maintaining a comfortable position She has had PT/OT in the past which has been benficial and she is again interested in this. Her chronic medical conditions include IDDM , hypertension asdnCKD She has a very complex medical regime, and oversight by nursing visiting home weekly would improve quality and quantity of her life. Observations/Objective: There were no vitals taken for this visit. Good communication with no confusion and intact memory. Alert and oriented x 3 No signs of respiratory distress during speech Pt lying in bend, limited mobility in upper and lower extremitie   Assessment and Plan: Generalized osteoarthritis Severe and debilitating generalized osteoarthritis , marked reduced ROM of shoulder , hips , knees and spine Bed ridden and unable to leave home without ambulance service I recommend in home medical attention, weekly nurse visits for eval and med mangement, also any lab draws, these are past  Due Also will benefit form PT/OT twice weekly for 6 to 8 weeks, with a view to improved mobility    Follow Up Instructions:    I discussed the assessment and treatment plan with the patient. The patient was provided an opportunity to ask questions and  all were answered. The patient agreed with the plan and demonstrated an understanding of the instructions.   The patient was advised to call back or seek an in-person evaluation if the symptoms worsen or if the condition fails to improve as anticipated.  I provided 12 minutes of non-face-to-face time during this encounter.   Tula Nakayama, MD

## 2021-08-23 NOTE — Assessment & Plan Note (Addendum)
Severe and debilitating generalized osteoarthritis , marked reduced ROM of shoulder , hips , knees and spine Bed ridden and unable to leave home without ambulance service I recommend in home medical attention, weekly nurse visits for eval and med mangement, also any lab draws, these are past  Due Also will benefit form PT/OT twice weekly for 6 to 8 weeks, with a view to improved mobility

## 2021-08-28 ENCOUNTER — Telehealth: Payer: Self-pay | Admitting: Family Medicine

## 2021-08-28 NOTE — Telephone Encounter (Signed)
Watt Climes, nurse with [facility] [phone], called stating that pt family was requesting labs. She is wanting to know what labs need to be drawn?

## 2021-08-28 NOTE — Telephone Encounter (Signed)
Faxed orders

## 2021-08-28 NOTE — Telephone Encounter (Signed)
Fax # [phone]

## 2021-08-31 ENCOUNTER — Other Ambulatory Visit: Payer: Self-pay | Admitting: Family Medicine

## 2021-09-03 ENCOUNTER — Encounter: Payer: Self-pay | Admitting: Family Medicine

## 2021-09-04 DIAGNOSIS — N189 Chronic kidney disease, unspecified: Secondary | ICD-10-CM

## 2021-09-04 DIAGNOSIS — Z794 Long term (current) use of insulin: Secondary | ICD-10-CM

## 2021-09-04 DIAGNOSIS — E1122 Type 2 diabetes mellitus with diabetic chronic kidney disease: Secondary | ICD-10-CM | POA: Diagnosis not present

## 2021-09-04 DIAGNOSIS — M150 Primary generalized (osteo)arthritis: Secondary | ICD-10-CM | POA: Diagnosis not present

## 2021-09-04 DIAGNOSIS — Z79891 Long term (current) use of opiate analgesic: Secondary | ICD-10-CM

## 2021-09-04 DIAGNOSIS — I129 Hypertensive chronic kidney disease with stage 1 through stage 4 chronic kidney disease, or unspecified chronic kidney disease: Secondary | ICD-10-CM | POA: Diagnosis not present

## 2021-09-04 DIAGNOSIS — J449 Chronic obstructive pulmonary disease, unspecified: Secondary | ICD-10-CM | POA: Diagnosis not present

## 2021-09-04 DIAGNOSIS — Z9181 History of falling: Secondary | ICD-10-CM

## 2021-09-04 DIAGNOSIS — Z9981 Dependence on supplemental oxygen: Secondary | ICD-10-CM

## 2021-09-09 ENCOUNTER — Telehealth: Payer: Self-pay | Admitting: Family Medicine

## 2021-09-09 ENCOUNTER — Other Ambulatory Visit: Payer: Self-pay | Admitting: Family Medicine

## 2021-09-09 DIAGNOSIS — N3001 Acute cystitis with hematuria: Secondary | ICD-10-CM

## 2021-09-09 MED ORDER — ERGOCALCIFEROL 1.25 MG (50000 UT) PO CAPS: 50000.0000 [IU] | ORAL_CAPSULE | ORAL | 1 refills | Status: DC

## 2021-09-09 NOTE — Telephone Encounter (Signed)
Ppls let pt and sister know blood sugar, cholesterol , thyroid function and liver function are excellent She has chronic kidne disease and anemia,  both stable Vitamin d is low needs to take once weekly vit D and I wil;l prescribe for 6 months , then check again Labs need to be added Urine tox and microalb when the urine c/s is done , you had ordered them but they were not done!

## 2021-09-10 ENCOUNTER — Other Ambulatory Visit: Payer: Self-pay | Admitting: Family Medicine

## 2021-09-10 ENCOUNTER — Encounter: Payer: Self-pay | Admitting: Family Medicine

## 2021-09-10 ENCOUNTER — Telehealth: Payer: Medicare HMO

## 2021-09-10 ENCOUNTER — Other Ambulatory Visit: Payer: Self-pay

## 2021-09-10 DIAGNOSIS — I129 Hypertensive chronic kidney disease with stage 1 through stage 4 chronic kidney disease, or unspecified chronic kidney disease: Secondary | ICD-10-CM | POA: Diagnosis not present

## 2021-09-10 DIAGNOSIS — M150 Primary generalized (osteo)arthritis: Secondary | ICD-10-CM | POA: Diagnosis not present

## 2021-09-10 DIAGNOSIS — Z0289 Encounter for other administrative examinations: Secondary | ICD-10-CM

## 2021-09-10 DIAGNOSIS — Z79891 Long term (current) use of opiate analgesic: Secondary | ICD-10-CM | POA: Diagnosis not present

## 2021-09-10 DIAGNOSIS — Z794 Long term (current) use of insulin: Secondary | ICD-10-CM | POA: Diagnosis not present

## 2021-09-10 DIAGNOSIS — E1122 Type 2 diabetes mellitus with diabetic chronic kidney disease: Secondary | ICD-10-CM | POA: Diagnosis not present

## 2021-09-10 DIAGNOSIS — N3001 Acute cystitis with hematuria: Secondary | ICD-10-CM | POA: Diagnosis not present

## 2021-09-10 DIAGNOSIS — N189 Chronic kidney disease, unspecified: Secondary | ICD-10-CM | POA: Diagnosis not present

## 2021-09-10 DIAGNOSIS — J449 Chronic obstructive pulmonary disease, unspecified: Secondary | ICD-10-CM | POA: Diagnosis not present

## 2021-09-10 MED ORDER — UNABLE TO FIND: 0 refills | Status: AC

## 2021-09-10 NOTE — Telephone Encounter (Signed)
[name] called said patient DME company for oxygen has changed from [place] to [place] per her insurance company [employer] effective September 14, 2021 will have to be sent to [place].  Please call patient son [name] and explain what steps to take to get this taken care of.  [phone] and cell # [phone].

## 2021-09-11 ENCOUNTER — Ambulatory Visit (INDEPENDENT_AMBULATORY_CARE_PROVIDER_SITE_OTHER): Payer: Medicare HMO

## 2021-09-11 ENCOUNTER — Other Ambulatory Visit: Payer: Self-pay

## 2021-09-11 DIAGNOSIS — R0902 Hypoxemia: Secondary | ICD-10-CM

## 2021-09-11 DIAGNOSIS — Z0000 Encounter for general adult medical examination without abnormal findings: Secondary | ICD-10-CM

## 2021-09-11 NOTE — Telephone Encounter (Signed)
Paperwork faxed to start process with [employer].

## 2021-09-11 NOTE — Patient Instructions (Signed)
Ms. Outten , Thank you for taking time to come for your Medicare Wellness Visit. I appreciate your ongoing commitment to your health goals. Please review the following plan we discussed and let me know if I can assist you in the future.   Screening recommendations/referrals: Bone Density: Completed  Recommended yearly ophthalmology/optometry visit for glaucoma screening and checkup Recommended yearly dental visit for hygiene and checkup  Vaccinations: Influenza vaccine: Completed Pneumococcal vaccine: Completed Tdap vaccine: Due Shingles vaccine: Get at your local pharmacy if you haven't already  Advanced directives: Bring to your next appt or have them dropped off  Conditions/risks identified: falls, hypertension, diabetes  Next appointment: 1 year   Preventive Care 22 Years and Older, Female Preventive care refers to lifestyle choices and visits with your health care provider that can promote health and wellness. What does preventive care include? A yearly physical exam. This is also called an annual well check. Dental exams once or twice a year. Routine eye exams. Ask your health care provider how often you should have your eyes checked. Personal lifestyle choices, including: Daily care of your teeth and gums. Regular physical activity. Eating a healthy diet. Avoiding tobacco and drug use. Limiting alcohol use. Practicing safe sex. Taking low-dose aspirin every day. Taking vitamin and mineral supplements as recommended by your health care provider. What happens during an annual well check? The services and screenings done by your health care provider during your annual well check will depend on your age, overall health, lifestyle risk factors, and family history of disease. Counseling  Your health care provider may ask you questions about your: Alcohol use. Tobacco use. Drug use. Emotional well-being. Home and relationship well-being. Sexual activity. Eating  habits. History of falls. Memory and ability to understand (cognition). Work and work Statistician. Reproductive health. Screening  You may have the following tests or measurements: Height, weight, and BMI. Blood pressure. Lipid and cholesterol levels. These may be checked every 5 years, or more frequently if you are over 67 years old. Skin check. Lung cancer screening. You may have this screening every year starting at age 44 if you have a 30-pack-year history of smoking and currently smoke or have quit within the past 15 years. Fecal occult blood test (FOBT) of the stool. You may have this test every year starting at age 95. Flexible sigmoidoscopy or colonoscopy. You may have a sigmoidoscopy every 5 years or a colonoscopy every 10 years starting at age 42. Hepatitis C blood test. Hepatitis B blood test. Sexually transmitted disease (STD) testing. Diabetes screening. This is done by checking your blood sugar (glucose) after you have not eaten for a while (fasting). You may have this done every 1-3 years. Bone density scan. This is done to screen for osteoporosis. You may have this done starting at age 4. Mammogram. This may be done every 1-2 years. Talk to your health care provider about how often you should have regular mammograms. Talk with your health care provider about your test results, treatment options, and if necessary, the need for more tests. Vaccines  Your health care provider may recommend certain vaccines, such as: Influenza vaccine. This is recommended every year. Tetanus, diphtheria, and acellular pertussis (Tdap, Td) vaccine. You may need a Td booster every 10 years. Zoster vaccine. You may need this after age 76. Pneumococcal 13-valent conjugate (PCV13) vaccine. One dose is recommended after age 79. Pneumococcal polysaccharide (PPSV23) vaccine. One dose is recommended after age 24. Talk to your health care provider about which  screenings and vaccines you need and how  often you need them. This information is not intended to replace advice given to you by your health care provider. Make sure you discuss any questions you have with your health care provider. Document Released: 02/28/2015 Document Revised: 10/22/2015 Document Reviewed: 12/03/2014 Elsevier Interactive Patient Education  2017 [employer] Prevention in the Home Falls can cause injuries. They can happen to people of all ages. There are many things you can do to make your home safe and to help prevent falls. What can I do on the outside of my home? Regularly fix the edges of walkways and driveways and fix any cracks. Remove anything that might make you trip as you walk through a door, such as a raised step or threshold. Trim any bushes or trees on the path to your home. Use bright outdoor lighting. Clear any walking paths of anything that might make someone trip, such as rocks or tools. Regularly check to see if handrails are loose or broken. Make sure that both sides of any steps have handrails. Any raised decks and porches should have guardrails on the edges. Have any leaves, snow, or ice cleared regularly. Use sand or salt on walking paths during winter. Clean up any spills in your garage right away. This includes oil or grease spills. What can I do in the bathroom? Use night lights. Install grab bars by the toilet and in the tub and shower. Do not use towel bars as grab bars. Use non-skid mats or decals in the tub or shower. If you need to sit down in the shower, use a plastic, non-slip stool. Keep the floor dry. Clean up any water that spills on the floor as soon as it happens. Remove soap buildup in the tub or shower regularly. Attach bath mats securely with double-sided non-slip rug tape. Do not have throw rugs and other things on the floor that can make you trip. What can I do in the bedroom? Use night lights. Make sure that you have a light by your bed that is easy to  reach. Do not use any sheets or blankets that are too big for your bed. They should not hang down onto the floor. Have a firm chair that has side arms. You can use this for support while you get dressed. Do not have throw rugs and other things on the floor that can make you trip. What can I do in the kitchen? Clean up any spills right away. Avoid walking on wet floors. Keep items that you use a lot in easy-to-reach places. If you need to reach something above you, use a strong step stool that has a grab bar. Keep electrical cords out of the way. Do not use floor polish or wax that makes floors slippery. If you must use wax, use non-skid floor wax. Do not have throw rugs and other things on the floor that can make you trip. What can I do with my stairs? Do not leave any items on the stairs. Make sure that there are handrails on both sides of the stairs and use them. Fix handrails that are broken or loose. Make sure that handrails are as long as the stairways. Check any carpeting to make sure that it is firmly attached to the stairs. Fix any carpet that is loose or worn. Avoid having throw rugs at the top or bottom of the stairs. If you do have throw rugs, attach them to the floor with carpet  tape. Make sure that you have a light switch at the top of the stairs and the bottom of the stairs. If you do not have them, ask someone to add them for you. What else can I do to help prevent falls? Wear shoes that: Do not have high heels. Have rubber bottoms. Are comfortable and fit you well. Are closed at the toe. Do not wear sandals. If you use a stepladder: Make sure that it is fully opened. Do not climb a closed stepladder. Make sure that both sides of the stepladder are locked into place. Ask someone to hold it for you, if possible. Clearly mark and make sure that you can see: Any grab bars or handrails. First and last steps. Where the edge of each step is. Use tools that help you move  around (mobility aids) if they are needed. These include: Canes. Walkers. Scooters. Crutches. Turn on the lights when you go into a dark area. Replace any light bulbs as soon as they burn out. Set up your furniture so you have a clear path. Avoid moving your furniture around. If any of your floors are uneven, fix them. If there are any pets around you, be aware of where they are. Review your medicines with your doctor. Some medicines can make you feel dizzy. This can increase your chance of falling. Ask your doctor what other things that you can do to help prevent falls. This information is not intended to replace advice given to you by your health care provider. Make sure you discuss any questions you have with your health care provider. Document Released: 11/28/2008 Document Revised: 07/10/2015 Document Reviewed: 03/08/2014 Elsevier Interactive Patient Education  2017 [employer].

## 2021-09-11 NOTE — Progress Notes (Signed)
Subjective:   [name] is a 81 y.o. female who presents for Medicare Annual (Subsequent) preventive examination.  I connected with  [name] on 09/11/21 by a audio enabled telemedicine application and verified that I am speaking with the correct person using two identifiers.  Patient Location: Home  Provider Location: Office/Clinic  I discussed the limitations of evaluation and management by telemedicine. The patient expressed understanding and agreed to proceed.  Review of Systems     [name] , Thank you for taking time to come for your Medicare Wellness Visit. I appreciate your ongoing commitment to your health goals. Please review the following plan we discussed and let me know if I can assist you in the future.   These are the goals we discussed:  Goals      Blood Pressure < 140/90     HEMOGLOBIN A1C < 7     Reduce carbs and sweets in diet     LIFESTYLE - DECREASE FALLS RISK     Patient Stated     Would like to keep moving and stay out of pain         This is a list of the screening recommended for you and due dates:  Health Maintenance  Topic Date Due   Zoster (Shingles) Vaccine (1 of 2) Never done   Complete foot exam   11/30/2020   COVID-19 Vaccine (4 - Pfizer series) 01/08/2021   Hemoglobin A1C  05/13/2021   Eye exam for diabetics  08/19/2021   Tetanus Vaccine  07/14/2022*   Flu Shot  09/15/2021   Pneumonia Vaccine  Completed   DEXA scan (bone density measurement)  Completed   HPV Vaccine  Aged Out  *Topic was postponed. The date shown is not the original due date.          Objective:    There were no vitals filed for this visit. There is no height or weight on file to calculate BMI.     07/02/2021   10:13 AM 09/10/2020   11:30 AM 01/09/2020    2:39 PM 04/20/2019    7:35 PM 04/20/2019   11:19 AM 11/11/2018    5:58 AM 11/10/2018   12:14 PM  Advanced Directives  Does Patient Have a Medical Advance Directive? Yes Yes Unable to assess,  patient is non-responsive or altered mental status No No Yes Yes  Type of Advance Directive  Healthcare Power of [place]  Does patient want to make changes to medical advance directive? No - Patient declined No - Patient declined    No - Patient declined No - Patient declined  Copy of [place] in Chart?  No - copy requested       Would patient like information on creating a medical advance directive?  No - Patient declined  No - Patient declined       Current Medications (verified) Outpatient Encounter Medications as of 09/11/2021  Medication Sig   ACCU-CHEK AVIVA PLUS test strip USE TO TEST BLOOD SUGAR TWICE DAILY.   acetaminophen (TYLENOL) 500 MG tablet Take one tablet two times daily for chronic pain (Patient taking differently: Take 500 mg by mouth in the morning and at bedtime. for chronic pain)   albuterol (VENTOLIN HFA) 108 (90 Base) MCG/ACT inhaler Inhale 2 puffs into the lungs every 6 (six) hours as needed for wheezing or shortness of breath.   amLODipine (NORVASC) 10  MG tablet TAKE 1 TABLET BY MOUTH ONCE DAILY.   atorvastatin (LIPITOR) 40 MG tablet TAKE 1 TABLET BY MOUTH ONCE DAILY.   BD INSULIN SYRINGE U/F 31G X 5/16" 1 ML MISC USE AS DIRECTED   busPIRone (BUSPAR) 7.5 MG tablet TAKE (1) TABLET BY MOUTH THREE TIMES DAILY   cloNIDine (CATAPRES) 0.2 MG tablet TAKE ONE TABLET BY MOUTH AT BEDTIME.   clotrimazole-betamethasone (LOTRISONE) cream Apply 1 application topically 2 (two) times daily. D/c vusion ointment   COMBIGAN 0.2-0.5 % ophthalmic solution Place 1 drop into both eyes 2 (two) times daily.   dabigatran (PRADAXA) 150 MG CAPS capsule TAKE (1) CAPSULE BY MOUTH EVERY TWELVE HOURS.   docusate sodium (COLACE) 100 MG capsule Take 1 capsule (100 mg total) by mouth daily as needed for mild constipation. (Patient taking differently: Take 100 mg by mouth daily.)   donepezil (ARICEPT) 10 MG tablet TAKE ONE  TABLET BY MOUTH AT BEDTIME.   ergocalciferol (VITAMIN D2) 1.25 MG (50000 UT) capsule Take 1 capsule (50,000 Units total) by mouth once a week. One capsule once weekly   hydrOXYzine (VISTARIL) 25 MG capsule TAKE (1) CAPSULE BY MOUTH THREE TIMES DAILY   imipramine (TOFRANIL) 50 MG tablet TAKE 2 TABLETS BY MOUTH AT BEDTIME   Insulin Syringe-Needle U-100 32G X 5/16" 1 ML MISC 1 each by Does not apply route in the morning and at bedtime. Use to inject insulin twice daily dx e11.65   meclizine (ANTIVERT) 25 MG tablet Take 1 tablet (25 mg total) by mouth 3 (three) times daily as needed for dizziness.   montelukast (SINGULAIR) 10 MG tablet TAKE ONE TABLET BY MOUTH AT BEDTIME.   NOVOLIN 70/30 (70-30) 100 UNIT/ML injection INJECT 55 UNITS S.Q. TWICE DAILY WITH A MEAL.   nystatin (MYCOSTATIN/NYSTOP) powder APPLY TOPCIALLY TO RASH UNDER BREASTS ONCE DAILY AS NEEDED.   olopatadine (PATANOL) 0.1 % ophthalmic solution Place 1 drop into both eyes 2 (two) times daily.   omeprazole (PRILOSEC) 40 MG capsule TAKE (1) CAPSULE BY MOUTH TWICE DAILY.   oxyCODONE-acetaminophen (PERCOCET) 10-325 MG tablet Take one tablet by moth two times daily for pain (Patient not taking: Reported on 07/02/2021)   oxyCODONE-acetaminophen (PERCOCET) 10-325 MG tablet Take on tablet by mouth two times daily for chronic pain (Patient not taking: Reported on 08/11/2021)   [START ON 09/20/2021] oxyCODONE-acetaminophen (PERCOCET) 10-325 MG tablet Take one tablet by mouth two times daily for chronic pain (Patient not taking: Reported on 08/11/2021)   [START ON 10/20/2021] oxyCODONE-acetaminophen (PERCOCET) 10-325 MG tablet Take one tablet by mouth two times daily for chronic pain   SUMAtriptan (IMITREX) 25 MG tablet TAKE 1 TABLET BY MOUTH AS NEEDED FOR MIGRAINE. MAY REPEAT IN 2 HOURS IF HEADACHE PERSISTS OR RECURS.   topiramate (TOPAMAX) 100 MG tablet TAKE 1 TABLET BY MOUTH TWICE DAILY   ULTICARE MINI PEN NEEDLES 31G X 6 MM MISC USE TO INJECT NOVOLIN  TWICE DAILY.   UNABLE TO FIND Standard wheelchair with elevating leg rests DX M25.561, M54.41   UNABLE TO FIND Skilled Nursing to eval and treat as indicated. Will need routine lab work and vital sign assessments. (Patient not taking: Reported on 07/02/2021)   [place] to evaluate and treat as indicated. Dx: Z26.2 (Patient not taking: Reported on 07/02/2021)   UNABLE TO FIND Med Name: toxassure 8 dx: PAIN MANAGEMENT CONTRACT Z08.89   Facility-Administered Encounter Medications as of 09/11/2021  Medication   fentaNYL (SUBLIMAZE) injection 25-50 mcg    Allergies (  verified) Penicillins, Fentanyl, Sulfonamide derivatives, Sulfur, and Codeine   History: Past Medical History:  Diagnosis Date   Anemia, iron deficiency 2012   Evaluated by Dr. Oneida Alar; H&H of 9.3/30.8 with MCV-79 in 10/2010; 3/3 positive Hemoccult cards in 07/2011   Anxiety    was on Prozac for a couple of weeks   Atrial fibrillation ([place])    takes Coumadin daily   Bruises easily    takes Coumadin   Chronic anticoagulation 07/21/2010   Chronic back pain    related to knee pain   Degenerative joint disease    Knees   Dementia ([place])    Depression    Diabetes mellitus    takes Metformin daily   Gastroesophageal reflux disease    takes Omeprazole daily   Glaucoma    Glaucoma    uses eye drops at night   History of blood transfusion 1969   History of gout    was on medication but taken off of several months ago   Hx of colonic polyps    Hx of migraines    last one about a yr ago;takes Topamax daily   Hyperlipidemia    takes Pravastatin daily   Hypertension    takes Hyzaar daily and Propranolol    Insomnia    takes Restoril prn   Joint pain    Joint swelling    Memory loss    takes donepezil   Migraine    Migraine    Nocturia    Overweight(278.02)    Pneumonia 1969   hx of   Pulmonary embolism [facility]) May 2012   acute presentation, bilateral PE   Skin spots-aging    Urinary frequency     Past Surgical History:  Procedure Laterality Date   BIOPSY  04/22/2019   Procedure: BIOPSY;  Surgeon: Daneil Dolin, MD;  [facility];  Service: Endoscopy;;  duodenum   CATARACT EXTRACTION W/PHACO Right 04/11/2012   Procedure: CATARACT EXTRACTION PHACO AND INTRAOCULAR LENS PLACEMENT ([place]);  Surgeon: Elta Guadeloupe T. Gershon Crane, MD;  Location: [facility];  Service: Ophthalmology;  Laterality: Right;  CDE=9.61   CATARACT EXTRACTION W/PHACO Left 12/26/2012   Procedure: CATARACT EXTRACTION PHACO AND INTRAOCULAR LENS PLACEMENT ([facility]);  Surgeon: Elta Guadeloupe T. Gershon Crane, MD;  Location: [facility];  Service: Ophthalmology;  Laterality: Left;  CDE:7.74   COLONOSCOPY  Jan 2002; 2012   2002: Dr. Tamala Julian, ext. hemorrhoids, nl colon; 2012-adenomatous polyps, gastritis on EGD   COLONOSCOPY N/A 04/24/2019   Procedure: COLONOSCOPY;  Surgeon: Rogene Houston, MD;  [facility];  Service: Endoscopy;  Laterality: N/A;   CYSTOSCOPY W/ URETERAL STENT PLACEMENT Bilateral 02/25/2018   Procedure: CYSTOSCOPY WITH BILATERAL RETROGRADE PYELOGRAM/BILATERAL URETERAL STENT PLACEMENT, BLADDER BIOPSIES WITH FULGERATION;  Surgeon: [name], MD;  Location: [facility];  Service: Urology;  Laterality: Bilateral;   CYSTOSCOPY/URETEROSCOPY/HOLMIUM LASER/STENT PLACEMENT Bilateral 03/21/2018   Procedure: CYSTOSCOPY/URETEROSCOPY/HOLMIUM LASER/STENT PLACEMENT;  Surgeon: [name], MD;  Location: [facility];  Service: Urology;  Laterality: Bilateral;  ONLY NEEDS 60 MIN   DILATION AND CURETTAGE OF UTERUS     ESOPHAGOGASTRODUODENOSCOPY  08/24/2011   ESOPHAGOGASTRODUODENOSCOPY; esophageal dilatation; Rogene Houston, MD;   ESOPHAGOGASTRODUODENOSCOPY N/A 04/22/2019   Procedure: ESOPHAGOGASTRODUODENOSCOPY (EGD);  Surgeon: Daneil Dolin, MD;  [facility];  Service: Endoscopy;  Laterality: N/A;   GIVENS CAPSULE STUDY  08/18/2011   Procedure: GIVENS CAPSULE STUDY;  Surgeon: Rogene Houston, MD;  [facility];  Service: Endoscopy;  Laterality: N/A;  Assumption W/ MENISCECTOMY  90's   Left   MALONEY DILATION  04/22/2019   Procedure: MALONEY DILATION;  Surgeon: Daneil Dolin, MD;  [facility];  Service: Endoscopy;;   ORIF ANKLE FRACTURE Right 90's   TONSILLECTOMY     TOTAL KNEE ARTHROPLASTY  10/20/2011   Procedure: TOTAL KNEE ARTHROPLASTY;  Surgeon: Ninetta Lights, MD;  Location: [facility];  Service: Orthopedics;  Laterality: Left;   UPPER GASTROINTESTINAL ENDOSCOPY     URETHRAL DILATION     Family History  Problem Relation Age of Onset   Diabetes Mother    Hypertension Mother    Arthritis Mother    Heart failure Mother    Migraines Mother    Kidney failure Mother    Leukemia Father    Migraines Father    Kidney failure Father    Diabetes Sister    Hypertension Sister    Diabetes Brother    Hypertension Brother    Hypertension Brother    Hypertension Brother    Hypertension Brother    Diabetes Brother    Diabetes Brother    Diabetes Brother    Pulmonary embolism Sister    Migraines Sister    Kidney failure Brother    Colon cancer Neg Hx    Colon polyps Neg Hx    Social History   Socioeconomic History   Marital status: Widowed    Spouse name: Not on file   Number of children: 2   Years of education: 84   Highest education level: Not on file  Occupational History   Occupation: retired     Comment: Licensed conveyancer Surveyor, quantity)    Employer: RETIRED  Tobacco Use   Smoking status: Never   Smokeless tobacco: Never  Vaping Use   Vaping Use: Never used  Substance and Sexual Activity   Alcohol use: No   Drug use: No   Sexual activity: Not Currently    Birth control/protection: Post-menopausal  Other Topics Concern   Not on file  Social History Narrative   Lives with son.   Right-handed.   1 cup caffeine per day.   Social Determinants of Health   Financial Resource Strain: Low Risk  (09/10/2020)   Overall Financial Resource Strain (CARDIA)     Difficulty of Paying Living Expenses: Not hard at all  Food Insecurity: No Food Insecurity (07/02/2021)   Hunger Vital Sign    Worried About Running Out of Food in the Last Year: Never true    Ran Out of Food in the Last Year: Never true  Transportation Needs: No Transportation Needs (07/02/2021)   PRAPARE - Hydrologist (Medical): No    Lack of Transportation (Non-Medical): No  Physical Activity: Inactive (09/10/2020)   Exercise Vital Sign    Days of Exercise per Week: 0 days    Minutes of Exercise per Session: 0 min  Stress: No Stress Concern Present (09/10/2020)   [place]    Feeling of Stress : Not at all  Social Connections: Moderately Isolated (09/10/2020)   Social Connection and Isolation Panel [NHANES]    Frequency of Communication with Friends and Family: More than three times a week    Frequency of Social Gatherings with Friends and Family: More than three times a week    Attends Religious Services: More than 4 times per year    Active Member of Clubs or Organizations: No  Attends Archivist Meetings: Never    Marital Status: Widowed    Tobacco Counseling Counseling given: Not Answered   Clinical Intake:                 Diabetic? Yes  Nutrition Risk Assessment:  Has the patient had any N/V/D within the last 2 months?  No  Does the patient have any non-healing wounds?  No  Has the patient had any unintentional weight loss or weight gain?  No   Diabetes:  Is the patient diabetic?  Yes  If diabetic, was a CBG obtained today?  Yes  Did the patient bring in their glucometer from home?  No  How often do you monitor your CBG's? Twice a day.   Financial Strains and Diabetes Management:  Are you having any financial strains with the device, your supplies or your medication? No .  Does the patient want to be seen by Chronic Care Management for management  of their diabetes?  No  Would the patient like to be referred to a Nutritionist or for Diabetic Management?  No   Diabetic Exams:  Diabetic Eye Exam: Completed 08/19/2020. Overdue for diabetic eye exam. Pt has been advised about the importance in completing this exam.   Diabetic Foot Exam: Completed 12/01/2019. Pt has been advised about the importance in completing this exam. .           Activities of Daily Living     No data to display           Patient Care Team: [name], MD as PCP - General (Family Medicine) Harl Bowie, Alphonse Guild, MD as PCP - Cardiology (Cardiology) Rogene Houston, MD (Gastroenterology) Renette Butters, MD as Attending Physician (Orthopedic Surgery) Kassie Mends, RN as [place] any recent Medical Services you may have received from other than [facility] providers in the past year (date may be approximate).     Assessment:   This is a routine wellness examination for [name].  Hearing/Vision screen No results found.  Dietary issues and exercise activities discussed:     Goals Addressed   None   Depression Screen    07/24/2021   11:21 AM 07/02/2021   10:05 AM 01/29/2021    2:41 PM 01/29/2021    2:40 PM 10/29/2020    2:37 PM 09/10/2020   11:30 AM 09/10/2020   11:27 AM  PHQ 2/9 Scores  PHQ - 2 Score 0 1   0 0 0  PHQ- 9 Score       0  Exception Documentation    Other- indicate reason in comment box     Not completed   spoke with patients sister        Fall Risk    07/24/2021   11:21 AM 07/02/2021   10:00 AM 01/29/2021    2:41 PM 01/29/2021    2:40 PM 10/29/2020    2:37 PM  [place] in the past year? 0 0 0 0 0  Number falls in past yr: 0  0 0 0  Injury with Fall? 0  0 0 0  Risk for fall due to : No Fall Risks      Follow up Falls evaluation completed        FALL RISK PREVENTION PERTAINING TO THE HOME:  Any stairs in or around the home? Yes  If so, are there any without  handrails? No  Home free of  loose throw rugs in walkways, pet beds, electrical cords, etc? Yes  Adequate lighting in your home to reduce risk of falls? Yes   ASSISTIVE DEVICES UTILIZED TO PREVENT FALLS:  Life alert? Yes  Use of a cane, walker or w/c? Yes  Grab bars in the bathroom? Yes  Shower chair or bench in shower? Yes  Elevated toilet seat or a handicapped toilet? Yes    Cognitive Function:    09/10/2020   11:32 AM 04/23/2014    4:30 PM  MMSE - Mini Mental State Exam  Not completed: Unable to complete   Orientation to time  2  Orientation to Place  2  Registration  0  Attention/ Calculation  0  Recall  0  Language- name 2 objects  2  Language- repeat  1  Language- follow 3 step command  3  Language- read & follow direction  1  Write a sentence  1  Copy design  1  Total score  13        09/10/2019    2:44 PM 08/09/2018    1:23 PM 08/02/2017    4:23 PM  6CIT Screen  What Year? 0 points 4 points 0 points  What month? 0 points 0 points 0 points  What time? 0 points 0 points 0 points  Count back from 20 0 points 0 points 0 points  Months in reverse 2 points 0 points 0 points  Repeat phrase 2 points 0 points 4 points  Total Score 4 points 4 points 4 points    Immunizations Immunization History  Administered Date(s) Administered   Fluad Quad(high Dose 65+) 12/04/2018, 11/28/2019, 11/13/2020   H1N1 12/19/2007   Influenza Split 11/23/2011   Influenza Whole 01/07/2004, 11/14/2007, 02/03/2009, 11/30/2009   Influenza, High Dose Seasonal PF 12/21/2017   Influenza,inj,Quad PF,6+ Mos 11/01/2012, 01/01/2014, 04/24/2015, 10/29/2015, 01/11/2017   Moderna SARS-COV2 Booster Vaccination 11/13/2020   PFIZER(Purple Top)SARS-COV-2 Vaccination 04/12/2019, 04/30/2019, 02/20/2020   Pneumococcal Conjugate-13 04/23/2014   Pneumococcal Polysaccharide-23 08/05/2010   Td 11/07/2003   Zoster, Live 04/12/2006    TDAP status: Due, Education has been provided regarding the importance of  this vaccine. Advised may receive this vaccine at local pharmacy or Health Dept. Aware to provide a copy of the vaccination record if obtained from local pharmacy or Health Dept. Verbalized acceptance and understanding.  Flu Vaccine status: Up to date  Pneumococcal vaccine status: Up to date  Covid-19 vaccine status: Completed vaccines  Qualifies for Shingles Vaccine? Yes   Zostavax completed Yes   Shingrix Completed?: Yes  Screening Tests Health Maintenance  Topic Date Due   Zoster Vaccines- Shingrix (1 of 2) Never done   FOOT EXAM  11/30/2020   COVID-19 Vaccine (4 - Pfizer series) 01/08/2021   HEMOGLOBIN A1C  05/13/2021   OPHTHALMOLOGY EXAM  08/19/2021   TETANUS/TDAP  07/14/2022 (Originally 11/06/2013)   INFLUENZA VACCINE  09/15/2021   Pneumonia Vaccine 26+ Years old  Completed   DEXA SCAN  Completed   HPV VACCINES  Aged Out    Health Maintenance  Health Maintenance Due  Topic Date Due   Zoster Vaccines- Shingrix (1 of 2) Never done   FOOT EXAM  11/30/2020   COVID-19 Vaccine (4 - Pfizer series) 01/08/2021   HEMOGLOBIN A1C  05/13/2021   OPHTHALMOLOGY EXAM  08/19/2021    Colorectal cancer screening: No longer required.   Mammogram status: No longer required due to age.  Bone Density status: Completed 08/31/2005. Results reflect: Bone density results: OSTEOPOROSIS. Repeat  every 3 years.  Lung Cancer Screening: (Low Dose CT Chest recommended if Age 44-80 years, 30 pack-year currently smoking OR have quit w/in 15years.) does not qualify.    Additional Screening:  Hepatitis C Screening: does qualify; Completed   Vision Screening: Recommended annual ophthalmology exams for early detection of glaucoma and other disorders of the eye. Is the patient up to date with their annual eye exam?  No  Who is the provider or what is the name of the office in which the patient attends annual eye exams?     Dental Screening: Recommended annual dental exams for proper oral  hygiene  Community Resource Referral / Chronic Care Management: CRR required this visit?  No   CCM required this visit?  No      Plan:     I have personally reviewed and noted the following in the patient's chart:   Medical and social history Use of alcohol, tobacco or illicit drugs  Current medications and supplements including opioid prescriptions.  Functional ability and status Nutritional status Physical activity Advanced directives List of other physicians Hospitalizations, surgeries, and ER visits in previous 12 months Vitals Screenings to include cognitive, depression, and falls Referrals and appointments  In addition, I have reviewed and discussed with patient certain preventive protocols, quality metrics, and best practice recommendations. A written personalized care plan for preventive services as well as general preventive health recommendations were provided to patient.     Johny Drilling, [place]   09/11/2021   Nurse Notes:  [name] , Thank you for taking time to come for your Medicare Wellness Visit. I appreciate your ongoing commitment to your health goals. Please review the following plan we discussed and let me know if I can assist you in the future.   These are the goals we discussed:  Goals      Blood Pressure < 140/90     HEMOGLOBIN A1C < 7     Reduce carbs and sweets in diet     LIFESTYLE - DECREASE FALLS RISK     Patient Stated     Would like to keep moving and stay out of pain         This is a list of the screening recommended for you and due dates:  Health Maintenance  Topic Date Due   Zoster (Shingles) Vaccine (1 of 2) Never done   Complete foot exam   11/30/2020   COVID-19 Vaccine (4 - Pfizer series) 01/08/2021   Hemoglobin A1C  05/13/2021   Eye exam for diabetics  08/19/2021   Tetanus Vaccine  07/14/2022*   Flu Shot  09/15/2021   Pneumonia Vaccine  Completed   DEXA scan (bone density measurement)  Completed   HPV Vaccine  Aged  Out  *Topic was postponed. The date shown is not the original due date.

## 2021-09-15 ENCOUNTER — Ambulatory Visit (INDEPENDENT_AMBULATORY_CARE_PROVIDER_SITE_OTHER): Payer: Medicare HMO | Admitting: Internal Medicine

## 2021-09-15 ENCOUNTER — Encounter: Payer: Self-pay | Admitting: Internal Medicine

## 2021-09-15 VITALS — BP 180/72

## 2021-09-15 DIAGNOSIS — N3000 Acute cystitis without hematuria: Secondary | ICD-10-CM | POA: Diagnosis not present

## 2021-09-15 DIAGNOSIS — N760 Acute vaginitis: Secondary | ICD-10-CM | POA: Diagnosis not present

## 2021-09-15 MED ORDER — FLUCONAZOLE 150 MG PO TABS: 150.0000 mg | ORAL_TABLET | Freq: Once | ORAL | 0 refills | Status: AC

## 2021-09-15 MED ORDER — NITROFURANTOIN MONOHYD MACRO 100 MG PO CAPS: 100.0000 mg | ORAL_CAPSULE | Freq: Two times a day (BID) | ORAL | 0 refills | Status: DC

## 2021-09-15 NOTE — Progress Notes (Signed)
Virtual Visit via Telephone Note   This visit type was conducted due to national recommendations for restrictions regarding the COVID-19 Pandemic (e.g. social distancing) in an effort to limit this patient's exposure and mitigate transmission in our community.  Due to her co-morbid illnesses, this patient is at least at moderate risk for complications without adequate follow up.  This format is felt to be most appropriate for this patient at this time.  The patient did not have access to video technology/had technical difficulties with video requiring transitioning to audio format only (telephone).  All issues noted in this document were discussed and addressed.  No physical exam could be performed with this format.  Evaluation Performed:  Follow-up visit  Date:  09/15/2021   ID:  [name], DOB 06-08-1940, MRN [id number]  Patient Location: Home Provider Location: Office/Clinic  Participants: Patient's sister - [name] Location of Patient: Home Location of Provider: Telehealth Consent was obtain for visit to be over via telehealth. I verified that I am speaking with the correct person using two identifiers.  PCP:  [name], MD   Chief Complaint: Urinary frequency and dysuria  History of Present Illness:    [name] is a 81 y.o. female who has a televisit for c/o dysuria, urinary frequency and fatigue for the last 1 week. She does not have fever, chills, nausea or vomiting. She has had recurrent UTIs in the past. She is currently homebound and has been sleeping throughtout the day today.  The patient does not have symptoms concerning for COVID-19 infection (fever, chills, cough, or new shortness of breath).   Past Medical, Surgical, Social History, Allergies, and Medications have been Reviewed.  Past Medical History:  Diagnosis Date   Anemia, iron deficiency 2012   Evaluated by Dr. Oneida Alar; H&H of 9.3/30.8 with MCV-79 in 10/2010; 3/3 positive Hemoccult cards in  07/2011   Anxiety    was on Prozac for a couple of weeks   Atrial fibrillation ([place])    takes Coumadin daily   Bruises easily    takes Coumadin   Chronic anticoagulation 07/21/2010   Chronic back pain    related to knee pain   Degenerative joint disease    Knees   Dementia (HCC)    Depression    Diabetes mellitus    takes Metformin daily   Gastroesophageal reflux disease    takes Omeprazole daily   Glaucoma    Glaucoma    uses eye drops at night   History of blood transfusion 1969   History of gout    was on medication but taken off of several months ago   Hx of colonic polyps    Hx of migraines    last one about a yr ago;takes Topamax daily   Hyperlipidemia    takes Pravastatin daily   Hypertension    takes Hyzaar daily and Propranolol    Insomnia    takes Restoril prn   Joint pain    Joint swelling    Memory loss    takes donepezil   Migraine    Migraine    Nocturia    Overweight(278.02)    Pneumonia 1969   hx of   Pulmonary embolism [facility]) May 2012   acute presentation, bilateral PE   Skin spots-aging    Urinary frequency    Past Surgical History:  Procedure Laterality Date   BIOPSY  04/22/2019   Procedure: BIOPSY;  Surgeon: Daneil Dolin, MD;  [facility];  Service: Endoscopy;;  duodenum   CATARACT EXTRACTION W/PHACO Right 04/11/2012   Procedure: CATARACT EXTRACTION PHACO AND INTRAOCULAR LENS PLACEMENT (IOC);  Surgeon: Elta Guadeloupe T. Gershon Crane, MD;  Location: [facility];  Service: Ophthalmology;  Laterality: Right;  CDE=9.61   CATARACT EXTRACTION W/PHACO Left 12/26/2012   Procedure: CATARACT EXTRACTION PHACO AND INTRAOCULAR LENS PLACEMENT (IOC);  Surgeon: Elta Guadeloupe T. Gershon Crane, MD;  Location: [facility];  Service: Ophthalmology;  Laterality: Left;  CDE:7.74   COLONOSCOPY  Jan 2002; 2012   2002: Dr. Tamala Julian, ext. hemorrhoids, nl colon; 2012-adenomatous polyps, gastritis on EGD   COLONOSCOPY N/A 04/24/2019   Procedure: COLONOSCOPY;  Surgeon: Rogene Houston, MD;  [facility];  Service: Endoscopy;  Laterality: N/A;   CYSTOSCOPY W/ URETERAL STENT PLACEMENT Bilateral 02/25/2018   Procedure: CYSTOSCOPY WITH BILATERAL RETROGRADE PYELOGRAM/BILATERAL URETERAL STENT PLACEMENT, BLADDER BIOPSIES WITH FULGERATION;  Surgeon: [name], MD;  Location: [facility];  Service: Urology;  Laterality: Bilateral;   CYSTOSCOPY/URETEROSCOPY/HOLMIUM LASER/STENT PLACEMENT Bilateral 03/21/2018   Procedure: CYSTOSCOPY/URETEROSCOPY/HOLMIUM LASER/STENT PLACEMENT;  Surgeon: [name], MD;  Location: [facility];  Service: Urology;  Laterality: Bilateral;  ONLY NEEDS 60 MIN   DILATION AND CURETTAGE OF UTERUS     ESOPHAGOGASTRODUODENOSCOPY  08/24/2011   ESOPHAGOGASTRODUODENOSCOPY; esophageal dilatation; Rogene Houston, MD;   ESOPHAGOGASTRODUODENOSCOPY N/A 04/22/2019   Procedure: ESOPHAGOGASTRODUODENOSCOPY (EGD);  Surgeon: Daneil Dolin, MD;  [facility];  Service: Endoscopy;  Laterality: N/A;   GIVENS CAPSULE STUDY  08/18/2011   Procedure: GIVENS CAPSULE STUDY;  Surgeon: Rogene Houston, MD;  [facility];  Service: Endoscopy;  Laterality: N/A;  730   KNEE ARTHROSCOPY W/ MENISCECTOMY  90's   Left   MALONEY DILATION  04/22/2019   Procedure: MALONEY DILATION;  Surgeon: Daneil Dolin, MD;  [facility];  Service: Endoscopy;;   ORIF ANKLE FRACTURE Right 90's   TONSILLECTOMY     TOTAL KNEE ARTHROPLASTY  10/20/2011   Procedure: TOTAL KNEE ARTHROPLASTY;  Surgeon: Ninetta Lights, MD;  Location: [place];  Service: Orthopedics;  Laterality: Left;   UPPER GASTROINTESTINAL ENDOSCOPY     URETHRAL DILATION       No outpatient medications have been marked as taking for the 09/15/21 encounter (Office Visit) with Lindell Spar, MD.     Allergies:   Penicillins, Fentanyl, Sulfonamide derivatives, Sulfur, and Codeine   ROS:   Please see the history of present illness.     All other systems reviewed and are negative.   Labs/Other Tests  and Data Reviewed:    Recent Labs: No results found for requested labs within last 365 days.   Recent Lipid Panel Lab Results  Component Value Date/Time   CHOL 147 11/13/2020 12:00 AM   TRIG 179 (A) 11/13/2020 12:00 AM   HDL 57 11/28/2019 03:01 PM   CHOLHDL 2.4 11/28/2019 03:01 PM   LDLCALC 63 11/13/2020 12:00 AM   LDLCALC 56 11/28/2019 03:01 PM    Wt Readings from Last 3 Encounters:  01/09/20 260 lb (117.9 kg)  11/28/19 280 lb (127 kg)  09/10/19 (!) 243 lb (110.2 kg)     ASSESSMENT & PLAN:    UTI Started Macrobid Urine culture showed E. Coli-pansensitive She is allergic to penicillin and sulfa sulfonamide derivatives Advised to increase fluid intake  Time:   Today, I have spent 12 minutes reviewing the chart, including problem list, medications, and with the patient with telehealth technology discussing the above problems.  Medication Adjustments/Labs and Tests Ordered: Current medicines are reviewed at length with the patient today.  Concerns regarding medicines are outlined above.   Tests Ordered: No orders of the defined types were placed in this encounter.   Medication Changes: No orders of the defined types were placed in this encounter.    Note: This dictation was prepared with Dragon dictation along with smaller phrase technology. Similar sounding words can be transcribed inadequately or may not be corrected upon review. Any transcriptional errors that result from this process are unintentional.      Disposition:  Follow up  Signed, Lindell Spar, MD  09/15/2021 3:02 PM     [place]

## 2021-09-15 NOTE — Telephone Encounter (Signed)
Patient sister aware and tele appt scheduled for tomorrow with Kristin Bruins

## 2021-09-16 ENCOUNTER — Telehealth: Payer: Medicare HMO | Admitting: Nurse Practitioner

## 2021-09-22 ENCOUNTER — Ambulatory Visit: Payer: Medicare HMO | Admitting: *Deleted

## 2021-09-22 ENCOUNTER — Other Ambulatory Visit: Payer: Self-pay

## 2021-09-22 ENCOUNTER — Telehealth: Payer: Self-pay | Admitting: Family Medicine

## 2021-09-22 DIAGNOSIS — E1149 Type 2 diabetes mellitus with other diabetic neurological complication: Secondary | ICD-10-CM

## 2021-09-22 DIAGNOSIS — I10 Essential (primary) hypertension: Secondary | ICD-10-CM

## 2021-09-22 MED ORDER — OMEPRAZOLE 40 MG PO CPDR: DELAYED_RELEASE_CAPSULE | ORAL | 1 refills | Status: DC

## 2021-09-22 NOTE — Telephone Encounter (Signed)
Refilled

## 2021-09-22 NOTE — Patient Instructions (Addendum)
Visit Information  Thank you for taking time to visit with me today. Please don't hesitate to contact me if I can be of assistance to you before our next scheduled telephone appointment.  Following are the goals we discussed today:  Take medications as prescribed   Attend all scheduled provider appointments Call pharmacy for medication refills 3-7 days in advance of running out of medications Call provider office for new concerns or questions  check blood sugar at prescribed times: twice daily check feet daily for cuts, sores or redness enter blood sugar readings and medication or insulin into daily log take the blood sugar log to all doctor visits take the blood sugar meter to all doctor visits trim toenails straight across fill half of plate with vegetables limit fast food meals to no more than 1 per week read food labels for fat, fiber, carbohydrates and portion size do heel pump exercise 2 to 3 times each day keep feet up while sitting wash and dry feet carefully every day wear comfortable, cotton socks wear comfortable, well-fitting shoes check blood pressure weekly choose a place to take my blood pressure (home, clinic or office, retail store) write blood pressure results in a log or diary keep a blood pressure log take blood pressure log to all doctor appointments take medications for blood pressure exactly as prescribed report new symptoms to your doctor eat more whole grains, fruits and vegetables, lean meats and healthy fats Follow carbohydrate modified diet Continue to work with [facility]) of [place] to work with [facility] Try to complete the exercises prescribed by physical therapist   Please call the care guide team at [phone] if you need to cancel or reschedule your appointment.   If you are experiencing a Mental Health or [place] or need someone to talk to, please    The patient verbalized  understanding of instructions, educational materials, and care plan provided today and DECLINED offer to receive copy of patient instructions, educational materials, and care plan.   Telephone follow up appointment with care management team member scheduled for:  11/10/21 at 32 am by care coordination team  Jacqlyn Larsen [facility], BSN RN Case Manager [facility] Primary Care [phone]

## 2021-09-22 NOTE — Telephone Encounter (Signed)
Patient needs refill on   omeprazole (PRILOSEC) 40 MG capsule

## 2021-09-22 NOTE — Chronic Care Management (AMB) (Signed)
Care Management    [name] Visit Note  09/22/2021 Name: [name] MRN: [id number] DOB: 01/12/1941  Subjective: [name] is a 81 y.o. year old female who is a primary care patient of [name], MD. The care management team was consulted for assistance with disease management and care coordination needs.    Engaged with patient by telephone for follow up visit in response to provider referral for case management and/or care coordination services.   Consent to Services:   [name] was given information about Care Management services today including:  Care Management services includes personalized support from designated clinical staff supervised by her physician, including individualized plan of care and coordination with other care providers 24/7 contact phone numbers for assistance for urgent and routine care needs. The patient may stop case management services at any time by phone call to the office staff.  Patient agreed to services and consent obtained.   Assessment: Review of patient past medical history, allergies, medications, health status, including review of consultants reports, laboratory and other test data, was performed as part of comprehensive evaluation and provision of chronic care management services.   SDOH (Social Determinants of Health) assessments and interventions performed:    Care Plan  Allergies  Allergen Reactions   Penicillins Itching    Did it involve swelling of the face/tongue/throat, SOB, or low BP? No Did it involve sudden or severe rash/hives, skin peeling, or any reaction on the inside of your mouth or nose? No Did you need to seek medical attention at a hospital or doctor's office? No When did it last happen?  many years ago    If all above answers are "NO", may proceed with cephalosporin use.     Fentanyl Itching   Sulfonamide Derivatives Hives   Sulfur    Codeine Itching and Rash    tussionex  Is tolerated by patient, no  phenergan dm     Outpatient Encounter Medications as of 09/22/2021  Medication Sig Note   ACCU-CHEK AVIVA PLUS test strip USE TO TEST BLOOD SUGAR TWICE DAILY.    acetaminophen (TYLENOL) 500 MG tablet Take one tablet two times daily for chronic pain (Patient taking differently: Take 500 mg by mouth in the morning and at bedtime. for chronic pain)    albuterol (VENTOLIN HFA) 108 (90 Base) MCG/ACT inhaler Inhale 2 puffs into the lungs every 6 (six) hours as needed for wheezing or shortness of breath.    amLODipine (NORVASC) 10 MG tablet TAKE 1 TABLET BY MOUTH ONCE DAILY.    atorvastatin (LIPITOR) 40 MG tablet TAKE 1 TABLET BY MOUTH ONCE DAILY.    BD INSULIN SYRINGE U/F 31G X 5/16" 1 ML MISC USE AS DIRECTED    busPIRone (BUSPAR) 7.5 MG tablet TAKE (1) TABLET BY MOUTH THREE TIMES DAILY    cloNIDine (CATAPRES) 0.2 MG tablet TAKE ONE TABLET BY MOUTH AT BEDTIME.    clotrimazole-betamethasone (LOTRISONE) cream Apply 1 application topically 2 (two) times daily. D/c vusion ointment    COMBIGAN 0.2-0.5 % ophthalmic solution Place 1 drop into both eyes 2 (two) times daily.    dabigatran (PRADAXA) 150 MG CAPS capsule TAKE (1) CAPSULE BY MOUTH EVERY TWELVE HOURS.    docusate sodium (COLACE) 100 MG capsule Take 1 capsule (100 mg total) by mouth daily as needed for mild constipation. (Patient taking differently: Take 100 mg by mouth daily.)    donepezil (ARICEPT) 10 MG tablet TAKE ONE TABLET BY MOUTH AT BEDTIME.  ergocalciferol (VITAMIN D2) 1.25 MG (50000 UT) capsule Take 1 capsule (50,000 Units total) by mouth once a week. One capsule once weekly    hydrOXYzine (VISTARIL) 25 MG capsule TAKE (1) CAPSULE BY MOUTH THREE TIMES DAILY    imipramine (TOFRANIL) 50 MG tablet TAKE 2 TABLETS BY MOUTH AT BEDTIME    Insulin Syringe-Needle U-100 32G X 5/16" 1 ML MISC 1 each by Does not apply route in the morning and at bedtime. Use to inject insulin twice daily dx e11.65    montelukast (SINGULAIR) 10 MG tablet TAKE ONE  TABLET BY MOUTH AT BEDTIME.    nitrofurantoin, macrocrystal-monohydrate, (MACROBID) 100 MG capsule Take 1 capsule (100 mg total) by mouth 2 (two) times daily.    NOVOLIN 70/30 (70-30) 100 UNIT/ML injection INJECT 55 UNITS S.Q. TWICE DAILY WITH A MEAL.    nystatin (MYCOSTATIN/NYSTOP) powder APPLY TOPCIALLY TO RASH UNDER BREASTS ONCE DAILY AS NEEDED.    omeprazole (PRILOSEC) 40 MG capsule TAKE (1) CAPSULE BY MOUTH TWICE DAILY.    oxyCODONE-acetaminophen (PERCOCET) 10-325 MG tablet Take one tablet by moth two times daily for pain    oxyCODONE-acetaminophen (PERCOCET) 10-325 MG tablet Take on tablet by mouth two times daily for chronic pain    SUMAtriptan (IMITREX) 25 MG tablet TAKE 1 TABLET BY MOUTH AS NEEDED FOR MIGRAINE. MAY REPEAT IN 2 HOURS IF HEADACHE PERSISTS OR RECURS.    topiramate (TOPAMAX) 100 MG tablet TAKE 1 TABLET BY MOUTH TWICE DAILY    ULTICARE MINI PEN NEEDLES 31G X 6 MM MISC USE TO INJECT NOVOLIN TWICE DAILY.    UNABLE TO FIND Standard wheelchair with elevating leg rests DX M25.561, M54.41    UNABLE TO FIND Skilled Nursing to eval and treat as indicated. Will need routine lab work and vital sign assessments.    UNABLE TO [place] to evaluate and treat as indicated. Dx: Z74.2    UNABLE TO FIND Med Name: toxassure 8 dx: PAIN MANAGEMENT CONTRACT Z08.89    meclizine (ANTIVERT) 25 MG tablet Take 1 tablet (25 mg total) by mouth 3 (three) times daily as needed for dizziness. (Patient not taking: Reported on 09/22/2021) 01/29/2021: Pt sister says she needs a refill   olopatadine (PATANOL) 0.1 % ophthalmic solution Place 1 drop into both eyes 2 (two) times daily. (Patient not taking: Reported on 09/22/2021) 11/28/2019: Needs refill    oxyCODONE-acetaminophen (PERCOCET) 10-325 MG tablet Take one tablet by mouth two times daily for chronic pain (Patient not taking: Reported on 09/22/2021)    [START ON 10/20/2021] oxyCODONE-acetaminophen (PERCOCET) 10-325 MG tablet Take one tablet by mouth two  times daily for chronic pain (Patient not taking: Reported on 09/22/2021)    [DISCONTINUED] omeprazole (PRILOSEC) 40 MG capsule TAKE (1) CAPSULE BY MOUTH TWICE DAILY.    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) 04/20/2019   Esophageal dysphagia 01/26/2019   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   History of pulmonary embolism 01/25/2018   Encounter for chronic pain management 07/17/2016   Chronic pain syndrome 02/03/2016   Nausea 09/03/2014   Bilateral knee pain 04/23/2014   Type 2 diabetes mellitus with neurological complications ([place]) 35/32/9924   Right-sided low back pain with right-sided sciatica 07/02/2013   Seasonal allergies  04/11/2013   Urinary incontinence 01/74/9449   Metabolic syndrome X 67/59/1638   Chronic anticoagulation 12/22/2012   Vitamin D deficiency 11/05/2012   Memory loss 11/01/2012   Depression with anxiety 11/01/2012   Anemia, iron deficiency    Morbid obesity ([place]) 08/16/2008   Globus pharyngeus 01/26/2008   Hyperlipidemia LDL goal <100 05/30/2007   Migraine 05/30/2007   Essential hypertension 05/30/2007   Gastroesophageal reflux disease 05/30/2007    Conditions to be addressed/monitored: HTN and DMII  Care Plan : [name] Care Manager Plan of Care  Updates made by [name] Mends, [name] since 09/22/2021 12:00 AM     Problem: No plan of care established for management of chronic disease state (DM2, HTN)   Priority: High     Long-Range Goal: Development of plan of care for chronic disease management  (DM2, HTN)   Start Date: 07/02/2021  Expected End Date: 12/29/2021  Priority: High  Note:   Current Barriers:  Knowledge Deficits related to plan of care for management of  HTN and DMII  Chronic Disease Management support and education needs related to HTN and DMII Spoke with patient's sister [name] DPR who reports patient now lives with her adult son and he has worked very hard helping his mother follow carbohydrate modified diet and AIC has been reduced significantly, CBG is checked BID with fasting ranges 80-100 and random ranges 120's range, blood pressure is checked on occasion and readings have been within normal limits.  Patient does require assistance with bathing, dressing, medication oversight, etc and family assists her but it is difficult to get patient out of the house. Home health [name] and PT recently discharged patient and patient had video visit today in hopes of getting home health reinstated.   Family reports Palliative Care in [facility] has not contacted them (although referral was placed), pt has needed DME in the home including wheelchair but does not have a ramp, care guide outreached family with resources. 09/22/21- update- Palliative care completed a home visit on 08/12/21, pt had acute cystitis 09/15/21 and is feeling better, [place] home health PT and [name] is working with patient  RNCM Clinical Goal(s):  Patient will verbalize understanding of plan for management of HTN and DMII as evidenced by patient, caregiver report, review of EHR and  through collaboration with [name] Care manager, provider, and care team.   Interventions: 1:1 collaboration with primary care provider regarding development and update of comprehensive plan of care as evidenced by provider attestation and co-signature Inter-disciplinary care team collaboration (see longitudinal plan of care) Evaluation of current treatment plan related to  self management and patient's adherence to plan as established by provider   Diabetes Interventions:  (Status:  New goal. and Goal on track:  Yes.) Long Term Goal Assessed patient's understanding of A1c goal: <7% Reviewed medications with  patient and discussed importance of medication adherence Counseled on importance of regular laboratory monitoring as prescribed Discussed plans with patient for ongoing care management follow up and provided patient with direct contact information for care management team Review of patient status, including review of consultants reports, relevant laboratory and other test results, and medications completed Reviewed carbohydrate modified diet Plan of care discussed with patient's sister Lab Results  Component Value Date   HGBA1C 7.0 11/13/2020   Hypertension Interventions:  (Status:  New goal. and Goal on track:  Yes.) Long Term Goal Last practice recorded BP readings:  BP Readings from Last 3 Encounters:  08/14/20 139/79  01/10/20 Marland Kitchen)  158/82  11/28/19 138/84  Most recent eGFR/CrCl: No results found for: EGFR  No components found for: CRCL  Reviewed medications with patient and discussed importance of compliance Advised patient, providing education and rationale, to monitor blood pressure daily and record, calling PCP for findings outside established parameters Reinforced low sodium diet and importance of reading food labels Reinforced limiting fast food  Patient Goals/Self-Care Activities: Take medications as prescribed   Attend all scheduled provider appointments Call pharmacy for medication refills 3-7 days in advance of running out of medications Call provider office for new concerns or questions  check blood sugar at prescribed times: twice daily check feet daily for cuts, sores or redness enter blood sugar readings and medication or insulin into daily log take the blood sugar log to all doctor visits take the blood sugar meter to all doctor visits trim toenails straight across fill half of plate with vegetables limit fast food meals to no more than 1 per week read food labels for fat, fiber, carbohydrates and portion size do heel pump exercise 2 to 3 times each day keep  feet up while sitting wash and dry feet carefully every day wear comfortable, cotton socks wear comfortable, well-fitting shoes check blood pressure weekly choose a place to take my blood pressure (home, clinic or office, retail store) write blood pressure results in a log or diary keep a blood pressure log take blood pressure log to all doctor appointments take medications for blood pressure exactly as prescribed report new symptoms to your doctor eat more whole grains, fruits and vegetables, lean meats and healthy fats Follow carbohydrate modified diet Continue to work with [facility]) of [place] to work with [facility] Try to complete the exercises prescribed by physical therapist        Plan: Telephone follow up appointment with care management team member scheduled for:  11/10/21 at 1030am with care coordination team  [name] [facility], BSN [name] Case Manager [facility] Primary Care [phone]

## 2021-10-05 ENCOUNTER — Telehealth: Payer: Self-pay | Admitting: Family Medicine

## 2021-10-05 ENCOUNTER — Other Ambulatory Visit: Payer: Self-pay

## 2021-10-05 MED ORDER — DABIGATRAN ETEXILATE MESYLATE 150 MG PO CAPS: ORAL_CAPSULE | ORAL | 2 refills | Status: DC

## 2021-10-05 NOTE — Telephone Encounter (Signed)
Medication sent.

## 2021-10-05 NOTE — Telephone Encounter (Signed)
Pt sister call stating she is completely out of dabigatran (PRADAXA) 150 MG CAPS capsule. Pharmacy gave her 1 for today. Can you please refill?    [employer]

## 2021-10-07 ENCOUNTER — Other Ambulatory Visit: Payer: Medicare HMO | Admitting: Primary Care

## 2021-10-07 ENCOUNTER — Telehealth: Payer: Self-pay | Admitting: Family Medicine

## 2021-10-07 DIAGNOSIS — M159 Polyosteoarthritis, unspecified: Secondary | ICD-10-CM

## 2021-10-07 DIAGNOSIS — E1149 Type 2 diabetes mellitus with other diabetic neurological complication: Secondary | ICD-10-CM

## 2021-10-07 DIAGNOSIS — Z515 Encounter for palliative care: Secondary | ICD-10-CM

## 2021-10-07 DIAGNOSIS — R413 Other amnesia: Secondary | ICD-10-CM

## 2021-10-07 NOTE — Telephone Encounter (Signed)
Ralene Bathe NP with [facility] was wanting to know how often you are going to need labs on [place] because home health cannot go to there home JUST to draw labs without taking her on for PT or OT and the family does not want therapy. She said with her palliative care status if yearly would be ok with you and since the virtual visits are going away she will not be able to have a face to face to qualify for home health next year. She discussed the option of a home care provider but they didn't want any part of it because they were so dedicated to you. And she doesn't think she is hospice eligible yet, just very deconditioned. She just wanted to know what your take on the situation was since she probably will not make it back to the office and what you see with her going forward with her care  Ralene Bathe [phone]

## 2021-10-07 NOTE — Progress Notes (Signed)
[facility] Palliative Care Consult Note Telephone: [phone]  Fax: [phone]    Date of encounter: 10/07/21 1:04 PM PATIENT NAME: [name] [address] [place] [address]   [phone] (home)  DOB: 23-Feb-1940 MRN: [id number] PRIMARY CARE PROVIDER:    [name], MD,  [address], [place] [address] [phone]  REFERRING PROVIDER:   [name], MD [address] [place],  [place] [address] [phone]  RESPONSIBLE PARTY:    Contact Information     Name Relation Home Work [place] Sister   [phone]   Douglas,Pam Daughter [phone]  [phone]   Parks Ranger   [phone]       I met face to face with patient and family in  home. Palliative Care was asked to follow this patient by consultation request of  [name], MD to address advance care planning and complex medical decision making. This is a follow up visit.                                   ASSESSMENT AND PLAN / RECOMMENDATIONS:   Advance Care Planning/Goals of Care: Goals include to maximize quality of life and symptom management. Patient/health care surrogate gave his/her permission to discuss.Our advance care planning conversation included a discussion about:    The value and importance of advance care planning  Experiences with loved ones who have been seriously ill or have died- Daughter  Exploration of personal, cultural or spiritual beliefs that might influence medical decisions  Exploration of goals of care in the event of a sudden injury or illness  Identification of a healthcare agent - son [name] Review  of an  advance directive document =no changes CODE STATUS: DNR  Symptom Management/Plan:   I met with patient at her home. Discussed her care with family members. I have since put in a call to her primary provider. We discussed her needs for care at home and in context of  a scheduled medication prescription. She would need to continue with a provider who has ability to monitor for compliance, as well as other primary concerns. Currently, they are very devoted to their pcp and do not want to consider an in-home provider. However, they do not have equipment or  transportation to take her out of her room. We discuss future needs including services wants telemedicine and in 14 months. And need for labs especially witnessed urine collection for ongoing controlled substance prescription.   UTI: Treated and resolved recently. Does better with timed voiding.Has h/o renal calculi. Has reduced frequency with better glucose control.   Nutrition; Improved, A1C is 6% range down from 13%. Son is monitoring diet  Primary care: Has concerns about getting out of the house to PCP. Wants to keep her MD but we also discussed her immobility.Discussed PHE ending and video appts ending in 12/24.Labs reviewed, some elevation in creatinine but overall WNL. Diswcu  Mobility: Can pivot transfer, uses w/c for longer distances. Stays in bedroom full time. Has not left room in 1 year.  Follow up Palliative Care Visit: Palliative care will continue to follow for complex medical decision making, advance care planning, and clarification of goals. Return 8-12 weeks or prn.  I spent 60 minutes providing this consultation. More than 50% of the time in this consultation was spent in counseling and  care coordination.  PPS: 40%  HOSPICE ELIGIBILITY/DIAGNOSIS: TBD  Chief Complaint: debility  HISTORY OF PRESENT ILLNESS:  JERNEY BAKSH is a 81 y.o. year old female  with obesity, immobility, DM, debility. Patient seen today to review palliative care needs to include medical decision making and advance care planning as appropriate.   History obtained from review of EMR, discussion with primary team, and interview with family, facility staff/caregiver and/or [name].  I reviewed available labs,  medications, imaging, studies and related documents from the EMR.  Records reviewed and summarized above.   ROS  General: NAD EYES: denies vision changes, has glaucoma ENMT: denies dysphagia Cardiovascular: denies chest pain, denies DOE Pulmonary: denies cough, denies increased SOB Abdomen: endorses good appetite, denies constipation, endorses continence of bowel GU: denies dysuria, endorses incontinence of urine MSK:  denies  increased weakness,  no falls reported Skin: denies rashes or wounds Neurological: endorses OA pain, denies insomnia Psych: Endorses positive mood  Physical Exam: Current and past weights: stable, unavailable Constitutional: NAD General: frail appearing, obese  EYES: anicteric sclera, lids intact, no discharge  ENMT: intact hearing, oral mucous membranes moist CV: S1S2, RRR, 2+ bil LE edema Pulmonary: LCTA, no increased work of breathing, no cough, room air (has oxygen, not on) Abdomen: intake 100%, soft and non tender, no ascites MSK: +sarcopenia, moves all extremities, non ambulatory, difficulty pivot transferring Skin: warm and dry, no rashes or wounds on visible skin Neuro:  + generalized weakness,  + cognitive impairment, non-anxious affect   Thank you for the opportunity to participate in the care of [name].  The palliative care team will continue to follow. Please call our office at [phone] if we can be of additional assistance.   Jason Coop, NP DNP, AGPCNP-BC  COVID-19 PATIENT SCREENING TOOL Asked and negative response unless otherwise noted:   Have you had symptoms of covid, tested positive or been in contact with someone with symptoms/positive test in the past 5-10 days?

## 2021-10-20 NOTE — Telephone Encounter (Signed)
Have you sent the order over for overnight pulse ox or oxygen over to [facility]?

## 2021-10-20 NOTE — Telephone Encounter (Signed)
I did I sent it back on 09/11/21

## 2021-10-30 ENCOUNTER — Encounter: Payer: Self-pay | Admitting: Family Medicine

## 2021-10-30 ENCOUNTER — Ambulatory Visit (INDEPENDENT_AMBULATORY_CARE_PROVIDER_SITE_OTHER): Payer: Medicare HMO | Admitting: Family Medicine

## 2021-10-30 ENCOUNTER — Telehealth: Payer: Self-pay

## 2021-10-30 ENCOUNTER — Other Ambulatory Visit: Payer: Self-pay

## 2021-10-30 DIAGNOSIS — G8929 Other chronic pain: Secondary | ICD-10-CM

## 2021-10-30 DIAGNOSIS — I10 Essential (primary) hypertension: Secondary | ICD-10-CM | POA: Diagnosis not present

## 2021-10-30 DIAGNOSIS — E785 Hyperlipidemia, unspecified: Secondary | ICD-10-CM | POA: Diagnosis not present

## 2021-10-30 DIAGNOSIS — M159 Polyosteoarthritis, unspecified: Secondary | ICD-10-CM | POA: Diagnosis not present

## 2021-10-30 DIAGNOSIS — E1149 Type 2 diabetes mellitus with other diabetic neurological complication: Secondary | ICD-10-CM | POA: Diagnosis not present

## 2021-10-30 MED ORDER — ERGOCALCIFEROL 1.25 MG (50000 UT) PO CAPS: 50000.0000 [IU] | ORAL_CAPSULE | ORAL | 1 refills | Status: DC

## 2021-10-30 MED ORDER — BUSPIRONE HCL 7.5 MG PO TABS: ORAL_TABLET | ORAL | 0 refills | Status: DC

## 2021-10-30 MED ORDER — MONTELUKAST SODIUM 10 MG PO TABS: 10.0000 mg | ORAL_TABLET | Freq: Every day | ORAL | 0 refills | Status: DC

## 2021-10-30 MED ORDER — NOVOLIN 70/30 (70-30) 100 UNIT/ML ~~LOC~~ SUSP: SUBCUTANEOUS | 0 refills | Status: DC

## 2021-10-30 MED ORDER — ULTICARE MINI PEN NEEDLES 31G X 6 MM MISC: 0 refills | Status: DC

## 2021-10-30 MED ORDER — ACCU-CHEK AVIVA PLUS VI STRP: ORAL_STRIP | 0 refills | Status: DC

## 2021-10-30 NOTE — Telephone Encounter (Signed)
sister called asked please send refills in for medicine  ergocalciferol (VITAMIN D2) 1.25 MG (50000 UT) capsule  busPIRone (BUSPAR) 7.5 MG tablet  montelukast (SINGULAIR) 10 MG tablet   Test strips, needles and insulin  ACCU-CHEK AVIVA PLUS test strip  BD INSULIN SYRINGE U/F 31G X 5/16" 1 ML MISC  BD INSULIN SYRINGE U/F 31G X 5/16" 1 ML MISC   Pharmacy: [facility]

## 2021-10-30 NOTE — Telephone Encounter (Signed)
Refill sent.

## 2021-10-30 NOTE — Progress Notes (Signed)
Virtual Visit via Telephone Note  I connected with [name] on 10/30/21 at 11:20 AM EDT by telephone and verified that I am speaking with the correct person using two identifiers.  Location: Patient: home Provider: office   I discussed the limitations, risks, security and privacy concerns of performing an evaluation and management service by telephone and the availability of in person appointments. I also discussed with the patient that there may be a patient responsible charge related to this service. The patient expressed understanding and agreed to proceed.   History of Present Illness: F/u chronic conditions. Most of history provided by sister  Reportedly doing well with no concerns Family has decided to keep her Primary care with me and state they will work on transporting her to the office, Ms Boller is in agreement   Observations/Objective: There were no vitals taken for this visit. Good communication with no confusion No signs of respiratory distress during speech   Assessment and Plan: Essential hypertension DASH diet and commitment to daily physical activity for a minimum of 30 minutes discussed and encouraged, as a part of hypertension management. The importance of attaining a healthy weight is also discussed. Needs office/ nursing eval for BP     09/15/2021    2:49 PM 08/14/2020    2:48 PM 08/14/2020    2:01 PM 01/10/2020    2:15 AM 01/09/2020   10:10 PM 01/09/2020    8:17 PM 01/09/2020    7:00 PM  BP/Weight  Systolic BP 829 562 130 865 784 696 295  Diastolic BP 72 79 97 82 82 88 104       Encounter for chronic pain management The patient's Controlled Substance registry is reviewed and compliance confirmed. Adequacy of  Pain control and level of function is assessed. Medication dosing is adjusted as deemed appropriate. Twelve weeks of medication is prescribed , with a follow up appointment between 11 to 12 weeks .   Type 2 diabetes mellitus with  neurological complications ([place]) Ms. Kindall is reminded of the importance of commitment to daily physical activity for 30 minutes or more, as able and the need to limit carbohydrate intake to 30 to 60 grams per meal to help with blood sugar control.   The need to take medication as prescribed, test blood sugar as directed, and to call between visits if there is a concern that blood sugar is uncontrolled is also discussed.   Ms. Shisler is reminded of the importance of daily foot exam, annual eye examination, and good blood sugar, blood pressure and cholesterol control. Updated lab revels good control     Latest Ref Rng & Units 11/13/2020   12:00 AM 01/09/2020    3:25 PM 11/28/2019    3:01 PM 08/27/2019    3:27 PM 05/22/2019   12:25 PM  Diabetic Labs  HbA1c  7.0      9.9  8.5    Chol 0 - 200 147      138     HDL > OR = 50 mg/dL   57     Calc LDL  63      56     Triglycerides 40 - 160 179      171     Creatinine 0.44 - 1.00 mg/dL  1.26  1.20  1.18  1.09      This result is from an external source.      09/15/2021    2:49 PM 08/14/2020    2:48 PM  08/14/2020    2:01 PM 01/10/2020    2:15 AM 01/09/2020   10:10 PM 01/09/2020    8:17 PM 01/09/2020    7:00 PM  BP/Weight  Systolic BP 505 697 948 016 553 748 270  Diastolic BP 72 79 97 82 82 88 104      Latest Ref Rng & Units 08/19/2020   12:00 AM 11/28/2019    1:40 PM  Foot/eye exam completion dates  Eye Exam No Retinopathy No Retinopathy       Foot Form Completion   Done     This result is from an external source.        Hyperlipidemia LDL goal <100 Updated lab in 08/2021 shows good control, no med change  Generalized osteoarthritis Progressively worsening, encourage increased safe mobility in the home as able Home safety reviewed also   Follow Up Instructions:    I discussed the assessment and treatment plan with the patient. The patient was provided an opportunity to ask questions and all were answered. The patient agreed  with the plan and demonstrated an understanding of the instructions.   The patient was advised to call back or seek an in-person evaluation if the symptoms worsen or if the condition fails to improve as anticipated.  I provided 24 minutes of non-face-to-face time during this encounter.   Tula Nakayama, MD

## 2021-10-30 NOTE — Patient Instructions (Addendum)
F/u last week in January in office  , call if you need me sooner  Please get flu and covid vacccines from your pharmacy, as usual  Please get Shingrix vaccines from your pharmacy  Continue to be safe, no falls  Thanks for choosing [facility], we consider it a privelige to serve you.

## 2021-11-07 ENCOUNTER — Encounter: Payer: Self-pay | Admitting: Family Medicine

## 2021-11-07 MED ORDER — OXYCODONE-ACETAMINOPHEN 10-325 MG PO TABS: ORAL_TABLET | ORAL | 0 refills | Status: DC

## 2021-11-07 NOTE — Assessment & Plan Note (Signed)
DASH diet and commitment to daily physical activity for a minimum of 30 minutes discussed and encouraged, as a part of hypertension management. The importance of attaining a healthy weight is also discussed. Needs office/ nursing eval for BP     09/15/2021    2:49 PM 08/14/2020    2:48 PM 08/14/2020    2:01 PM 01/10/2020    2:15 AM 01/09/2020   10:10 PM 01/09/2020    8:17 PM 01/09/2020    7:00 PM  BP/Weight  Systolic BP 967 893 810 175 102 585 277  Diastolic BP 72 79 97 82 82 88 104

## 2021-11-07 NOTE — Assessment & Plan Note (Signed)
[name] is reminded of the importance of commitment to daily physical activity for 30 minutes or more, as able and the need to limit carbohydrate intake to 30 to 60 grams per meal to help with blood sugar control.   The need to take medication as prescribed, test blood sugar as directed, and to call between visits if there is a concern that blood sugar is uncontrolled is also discussed.   [name] is reminded of the importance of daily foot exam, annual eye examination, and good blood sugar, blood pressure and cholesterol control. Updated lab revels good control     Latest Ref Rng & Units 11/13/2020   12:00 AM 01/09/2020    3:25 PM 11/28/2019    3:01 PM 08/27/2019    3:27 PM 05/22/2019   12:25 PM  Diabetic Labs  HbA1c  7.0      9.9  8.5    Chol 0 - 200 147      138     HDL > OR = 50 mg/dL   57     Calc LDL  63      56     Triglycerides 40 - 160 179      171     Creatinine 0.44 - 1.00 mg/dL  1.26  1.20  1.18  1.09      This result is from an external source.      09/15/2021    2:49 PM 08/14/2020    2:48 PM 08/14/2020    2:01 PM 01/10/2020    2:15 AM 01/09/2020   10:10 PM 01/09/2020    8:17 PM 01/09/2020    7:00 PM  BP/Weight  Systolic BP 449 201 007 121 975 883 254  Diastolic BP 72 79 97 82 82 88 104      Latest Ref Rng & Units 08/19/2020   12:00 AM 11/28/2019    1:40 PM  Foot/eye exam completion dates  Eye Exam No Retinopathy No Retinopathy       Foot Form Completion   Done     This result is from an external source.

## 2021-11-07 NOTE — Assessment & Plan Note (Signed)
The patient's Controlled Substance registry is reviewed and compliance confirmed. Adequacy of  Pain control and level of function is assessed. Medication dosing is adjusted as deemed appropriate. Twelve weeks of medication is prescribed , with a follow up appointment between 11 to 12 weeks .  

## 2021-11-07 NOTE — Assessment & Plan Note (Signed)
Updated lab in 08/2021 shows good control, no med change

## 2021-11-07 NOTE — Assessment & Plan Note (Signed)
Progressively worsening, encourage increased safe mobility in the home as able Home safety reviewed also

## 2021-11-10 ENCOUNTER — Ambulatory Visit: Payer: Self-pay | Admitting: *Deleted

## 2021-11-10 NOTE — Patient Outreach (Signed)
  Care Coordination   Initial Visit Note   11/10/2021 Name: [name] MRN: [id number] DOB: 1940/06/10  [name] is a 81 y.o. year old female who sees [name], [name] Levo, MD for primary care. I spoke with [name], the sister of [name] by phone today as indicated in CCM transfer.   What matters to the patients health and wellness today?  Getting patient out of the home to doctor office. Last home health services ended in August 2023 but the patient still is not able to ambulate. Her sister does not feel the home health services were beneficial. Pcp note 03/11/20 indicated patient refused home health services   [name] reports the patient is not able to get In and out of her home, immobile, debilitated, primarily stay sedentary in her lift chair or bedroom She reports the loss of the patient's daughter related to cancer in 2022 [name] reports she is disabled Pt's son and his wife are the primary support system. The son lives with the patient She reports the patient needs a ramp but they are not able to afford the wood requested to build the ramp. [name] care guide offered assist on 07/02/21   She reports the patient's office visits with Dr [name] have been telephonic/virtual but they have been informed that these services will end as she reports the pcp wants to see patient in the office in January 2024  [name] reports [name] attempted to get medicaid but does not qualify  Fall related to issues her chronic pain of her knee, leg & back  [name] reports her orthopedic with providers do not recommend surgery related to the patients age and medical issues ( hypertension, diabetes, chronic kidney disease stage G3b/A1, chronic atrial fibrillation, chronic pain syndrome)  [name] shares that the patient "is scared of falling" after a fall in 12/2019   Hypertension & diabetes  Her son checks her blood pressure and cbgs per her sister  Last palliative care home visit was on  10/07/21, next is 12/07/21   Goals Addressed               This Visit's Progress     Patient Stated     Resources/equipment to assist with debility & inability to leave her home (pt-stated)   Not on track     Care Coordination Interventions: Evaluation of current treatment plan related to patient's debility, degree of immobility, use of equipment and home health PT, ability to do Activities of daily living, support system, mobility aids and patient's adherence to plan as established by provider Provided patient and/or caregiver with some information about [name] [employer] RN CM, SW, Care guide services, rented ramps, insurance transportation Advice worker) Discussed plans with patient for ongoing care management follow up and provided patient with direct contact information for care management team Assessed social determinant of health barriers Attempt to provide positive outlook, encouragement provided        SDOH assessments and interventions completed:  Yes  SDOH Interventions Today    Flowsheet Row Most Recent Value  SDOH Interventions   Food Insecurity Interventions Intervention Not Indicated        Care Coordination Interventions Activated:  Yes  Care Coordination Interventions:  Yes, provided   Follow up plan: Follow up call scheduled for 11/24/21 1030    Encounter Outcome:  Pt. Visit Completed   [name] L. [name] Hamman, RN, BSN, [place] Coordinator Office number [phone]

## 2021-11-10 NOTE — Patient Instructions (Signed)
Visit Information  Thank you for taking time to visit with me today. Please don't hesitate to contact me if I can be of assistance to you.   Following are the goals we discussed today:   Goals Addressed               This Visit's Progress     Patient Stated     Resources/equipment to assist with debility & inability to leave her home (pt-stated)   Not on track     Care Coordination Interventions: Evaluation of current treatment plan related to patient's debility, degree of immobility, use of equipment and home health PT, ability to do Activities of daily living, support system, mobility aids and patient's adherence to plan as established by provider Provided patient and/or caregiver with some information about [facility] RN CM, SW, Care guide services, rented ramps, insurance transportation (community resource) Discussed plans with patient for ongoing care management follow up and provided patient with direct contact information for care management team Assessed social determinant of health barriers Attempt to provide positive outlook, encouragement provided        Our next appointment is by telephone on 11/24/21 at 1030  Please call the care guide team at [phone] if you need to cancel or reschedule your appointment.   If you are experiencing a Mental Health or [place] or need someone to talk to, please call the Suicide and Crisis Lifeline: 988 call the [place] National Suicide Prevention Lifeline: 5070833140 or TTY: 716 531 5004 TTY (856)538-5109) to talk to a trained counselor call 1-800-273-TALK (toll free, 24 hour hotline) call the [facility]: 828-271-8079 call 911   The patient verbalized understanding of instructions, educational materials, and care plan provided today and DECLINED offer to receive copy of patient instructions, educational materials, and care plan.   The patient has been provided with contact information for the care  management team and has been advised to call with any health related questions or concerns.   [place] Lavina Hamman, RN, BSN, [place] Coordinator Office number [phone]

## 2021-11-16 ENCOUNTER — Telehealth: Payer: Self-pay

## 2021-11-16 NOTE — Telephone Encounter (Signed)
Gerald Stabs call back # [phone] if needed to speak to him.

## 2021-11-16 NOTE — Telephone Encounter (Signed)
Chris from [facility] called trying to transition over to them need oxygen order with liter flow.  Fax # [phone]  Attn: family medical supply [place]

## 2021-11-17 ENCOUNTER — Other Ambulatory Visit: Payer: Self-pay

## 2021-11-17 DIAGNOSIS — R0902 Hypoxemia: Secondary | ICD-10-CM

## 2021-11-17 NOTE — Telephone Encounter (Signed)
Ordered and will fax when signed by Dr Moshe Cipro.

## 2021-11-24 ENCOUNTER — Other Ambulatory Visit: Payer: Self-pay

## 2021-11-24 ENCOUNTER — Ambulatory Visit: Payer: Self-pay | Admitting: *Deleted

## 2021-11-24 MED ORDER — AMLODIPINE BESYLATE 10 MG PO TABS: 10.0000 mg | ORAL_TABLET | Freq: Every day | ORAL | 0 refills | Status: DC

## 2021-11-24 NOTE — Patient Outreach (Signed)
  Care Coordination   Follow Up Visit Note   11/24/2021 Name: [name] MRN: [id number] DOB: 11/05/1940  [name] is a 81 y.o. year old female who sees [name], MD for primary care. I spoke with her sister [name] on behalf of  [name] (dementia) by phone today.  What matters to the patients health and wellness today?  Follow up to  sister about ramp needed to get patient out of the home to primary care appointments    Goals Addressed               This Visit's Progress     Patient Stated     Resources/equipment to assist with debility & inability to leave her home (pt-stated)   Not on track     Care Coordination Interventions: Provided patient and/or caregiver with some information about [employer] and Psychiatrist rented ramps,  Advice worker) Discussed plans with patient for ongoing care management follow up and provided patient with direct contact information for care management team Assessed social determinant of health barriers Attempt to provide positive outlook, encouragement provided Conference with [name] to [employer] and [employer] to get the costs of ramps out of pocket cost ([employer]$450 for 5 foot, $550 for 7 foot ramps) and rental ([place] 3 foot ramp $50-55/month rental) Encouraged [name] to consult with patient & her nephew about the ramp from [employer] with [employer] health when [place] returned the call to state ramps are available Unsuccessful outreach to [place] to updated her on available ramps No answer & voice mail box full        SDOH assessments and interventions completed:  Yes     Care Coordination Interventions Activated:  Yes  Care Coordination Interventions:  Yes, provided   Follow up plan: Follow up call scheduled for 11/25/21    Encounter Outcome:  Pt. Visit Completed   Tiny Rietz L. Lavina Hamman, RN, BSN, [place] Coordinator Office number [phone]

## 2021-11-24 NOTE — Patient Instructions (Signed)
Visit Information  Thank you for taking time to visit with me today. Please don't hesitate to contact me if I can be of assistance to you.   Following are the goals we discussed today:   Goals Addressed               This Visit's Progress     Patient Stated     Resources/equipment to assist with debility & inability to leave her home (pt-stated)   Not on track     Care Coordination Interventions: Provided patient and/or caregiver with some information about [employer] and Psychiatrist rented ramps,  Advice worker) Discussed plans with patient for ongoing care management follow up and provided patient with direct contact information for care management team Assessed social determinant of health barriers Attempt to provide positive outlook, encouragement provided Conference with [name] to [employer] and [employer] to get the costs of ramps out of pocket cost ([employer]$450 for 5 foot, $550 for 7 foot ramps) and rental ([place] 3 foot ramp $50-55/month rental) Encouraged Building services engineer to consult with patient & her nephew about the ramp from [employer] with [employer] health when [place] returned the call to state ramps are available Unsuccessful outreach to [place] to updated her on available ramps No answer & voice mail box full        Our next appointment is by telephone on 11/25/21 at 0900  Please call the care guide team at [phone] if you need to cancel or reschedule your appointment.   If you are experiencing a Mental Health or [place] or need someone to talk to, please call the Suicide and Crisis Lifeline: 988 call the [place] National Suicide Prevention Lifeline: 6011057359 or TTY: (367) 214-5387 TTY 409-373-0895) to talk to a trained counselor call 1-800-273-TALK (toll free, 24 hour hotline) call the [facility]: (873) 106-4095 call 911   The patient verbalized understanding of instructions, educational materials, and care plan provided today and  DECLINED offer to receive copy of patient instructions, educational materials, and care plan.   The patient has been provided with contact information for the care management team and has been advised to call with any health related questions or concerns.   [place] Lavina Hamman, RN, BSN, [place] Coordinator Office number [phone]

## 2021-11-25 ENCOUNTER — Ambulatory Visit: Payer: Self-pay | Admitting: *Deleted

## 2021-11-25 NOTE — Patient Outreach (Signed)
  Care Coordination   11/25/2021 Name: ENA DEMARY MRN: [id number] DOB: August 10, 1940   Care Coordination Outreach Attempts:  An unsuccessful telephone outreach was attempted today to offer the patient information about available care coordination services as a benefit of their health plan.  Second attempt since 11/24/21 to return a call to patient's sister [name] to discussed the return call from [place] ([employer]) about available ramps and costs  Follow Up Plan:  Additional outreach attempts will be made to offer the patient care coordination information and services.   Encounter Outcome:  No Answer  Care Coordination Interventions Activated:  No   Care Coordination Interventions:  No, not indicated    SIG Nayleen Janosik L. Lavina Hamman, RN, BSN, [place] Coordinator Office number [phone]

## 2021-12-02 ENCOUNTER — Ambulatory Visit: Payer: Self-pay | Admitting: *Deleted

## 2021-12-02 NOTE — Patient Outreach (Signed)
  Care Coordination   12/02/2021 Name: [name] MRN: [id number] DOB: 1940-10-12   Care Coordination Outreach Attempts:  A second unsuccessful outreach was attempted today to offer the patient with information about available care coordination services as a benefit of their health plan.    [name]'s  (sister 7 preferred number for patient) voice mail box is full [phone] There is not an e-mail to outreach to   Follow Up Plan:  Additional outreach attempts will be made to offer the patient care coordination information and services.   Encounter Outcome:  No Answer  Care Coordination Interventions Activated:  No   Care Coordination Interventions:  No, not indicated     Derin Granquist L. Lavina Hamman, RN, BSN, [place] Coordinator Office number [phone]

## 2021-12-08 ENCOUNTER — Ambulatory Visit: Payer: Self-pay | Admitting: *Deleted

## 2021-12-08 NOTE — Patient Outreach (Addendum)
  Care Coordination   12/08/2021 Name: [name] MRN: [id number] DOB: January 25, 1941   Care Coordination Outreach Attempts:  A third unsuccessful outreach was attempted today to offer the patient with information about available care coordination services as a benefit of their health plan.   Follow Up Plan:  No further outreach attempts will be made at this time. We have been unable to contact the patient to offer or enroll patient in care coordination services  Encounter Outcome:  No Answer  Care Coordination Interventions Activated:  No   Care Coordination Interventions:  No, not indicated    Athen Riel L. Lavina Hamman, RN, BSN, [place] Coordinator Office number [phone]

## 2021-12-10 ENCOUNTER — Other Ambulatory Visit: Payer: Self-pay | Admitting: Family Medicine

## 2021-12-11 ENCOUNTER — Other Ambulatory Visit: Payer: Self-pay | Admitting: Family Medicine

## 2021-12-30 ENCOUNTER — Other Ambulatory Visit: Payer: Medicare HMO | Admitting: Primary Care

## 2022-01-14 ENCOUNTER — Other Ambulatory Visit: Payer: Self-pay

## 2022-01-14 MED ORDER — NOVOLIN 70/30 (70-30) 100 UNIT/ML ~~LOC~~ SUSP: SUBCUTANEOUS | 5 refills | Status: DC

## 2022-01-14 MED ORDER — ULTICARE MINI PEN NEEDLES 31G X 6 MM MISC: 5 refills | Status: AC

## 2022-01-14 MED ORDER — HYDROXYZINE PAMOATE 25 MG PO CAPS: ORAL_CAPSULE | ORAL | 1 refills | Status: DC

## 2022-01-14 MED ORDER — ACCU-CHEK AVIVA PLUS VI STRP: ORAL_STRIP | 5 refills | Status: DC

## 2022-01-14 MED ORDER — MONTELUKAST SODIUM 10 MG PO TABS: 10.0000 mg | ORAL_TABLET | Freq: Every day | ORAL | 5 refills | Status: DC

## 2022-01-26 ENCOUNTER — Other Ambulatory Visit: Payer: Self-pay | Admitting: Family Medicine

## 2022-02-16 ENCOUNTER — Other Ambulatory Visit: Payer: Self-pay | Admitting: Family Medicine

## 2022-02-19 ENCOUNTER — Other Ambulatory Visit: Payer: Self-pay | Admitting: Family Medicine

## 2022-02-19 NOTE — Telephone Encounter (Signed)
Pt is out of this medication & wants to know if you can please refill?    oxyCODONE-acetaminophen (PERCOCET) 10-325 MG tablet   [facility]

## 2022-03-01 ENCOUNTER — Other Ambulatory Visit: Payer: Self-pay | Admitting: Family Medicine

## 2022-03-11 ENCOUNTER — Ambulatory Visit: Payer: Medicare HMO | Admitting: Family Medicine

## 2022-03-15 ENCOUNTER — Other Ambulatory Visit: Payer: Self-pay | Admitting: Family Medicine

## 2022-03-16 ENCOUNTER — Ambulatory Visit: Payer: Medicare HMO | Admitting: Family Medicine

## 2022-03-18 ENCOUNTER — Encounter: Payer: Self-pay | Admitting: Family Medicine

## 2022-03-18 ENCOUNTER — Ambulatory Visit (INDEPENDENT_AMBULATORY_CARE_PROVIDER_SITE_OTHER): Payer: Medicare HMO | Admitting: Family Medicine

## 2022-03-18 VITALS — BP 160/78 | HR 107 | Resp 18 | Ht 67.0 in | Wt 242.0 lb

## 2022-03-18 DIAGNOSIS — I10 Essential (primary) hypertension: Secondary | ICD-10-CM

## 2022-03-18 DIAGNOSIS — R300 Dysuria: Secondary | ICD-10-CM | POA: Diagnosis not present

## 2022-03-18 DIAGNOSIS — N183 Chronic kidney disease, stage 3 unspecified: Secondary | ICD-10-CM

## 2022-03-18 DIAGNOSIS — G8929 Other chronic pain: Secondary | ICD-10-CM

## 2022-03-18 DIAGNOSIS — R413 Other amnesia: Secondary | ICD-10-CM | POA: Diagnosis not present

## 2022-03-18 DIAGNOSIS — E559 Vitamin D deficiency, unspecified: Secondary | ICD-10-CM | POA: Diagnosis not present

## 2022-03-18 DIAGNOSIS — I130 Hypertensive heart and chronic kidney disease with heart failure and stage 1 through stage 4 chronic kidney disease, or unspecified chronic kidney disease: Secondary | ICD-10-CM | POA: Diagnosis not present

## 2022-03-18 DIAGNOSIS — E785 Hyperlipidemia, unspecified: Secondary | ICD-10-CM

## 2022-03-18 DIAGNOSIS — E1149 Type 2 diabetes mellitus with other diabetic neurological complication: Secondary | ICD-10-CM

## 2022-03-18 MED ORDER — AZELASTINE HCL 0.05 % OP SOLN: 1.0000 [drp] | Freq: Two times a day (BID) | OPHTHALMIC | 5 refills | Status: DC

## 2022-03-18 MED ORDER — ACCU-CHEK AVIVA PLUS VI STRP: ORAL_STRIP | 5 refills | Status: DC

## 2022-03-18 MED ORDER — OLOPATADINE HCL 0.1 % OP SOLN: 1.0000 [drp] | Freq: Two times a day (BID) | OPHTHALMIC | 1 refills | Status: AC

## 2022-03-18 MED ORDER — CLOTRIMAZOLE-BETAMETHASONE 1-0.05 % EX CREA: TOPICAL_CREAM | CUTANEOUS | Status: DC

## 2022-03-18 NOTE — Patient Instructions (Addendum)
F/u in 4 months, call if you need me sooner  Lipid, cmp and eGFR, hBA1c, Vit d today, CCUA and c/S if abn, may go straight to lab the urine, no need for in house test  Careful not to fall  Thanks for choosing [facility], we consider it a privelige to serve you.

## 2022-03-20 LAB — CMP14+EGFR
ALT: 9 IU/L (ref 0–32)
AST: 12 IU/L (ref 0–40)
Albumin/Globulin Ratio: 1.5 (ref 1.2–2.2)
Albumin: 4.4 g/dL (ref 3.7–4.7)
Alkaline Phosphatase: 160 IU/L — ABNORMAL HIGH (ref 44–121)
BUN/Creatinine Ratio: 13 (ref 12–28)
BUN: 21 mg/dL (ref 8–27)
Bilirubin Total: <0.2 mg/dL (ref 0.0–1.2)
CO2: 21 mmol/L (ref 20–29)
Calcium: 9 mg/dL (ref 8.7–10.3)
Chloride: 104 mmol/L (ref 96–106)
Creatinine, Ser: 1.58 mg/dL — ABNORMAL HIGH (ref 0.57–1.00)
Globulin, Total: 3 g/dL (ref 1.5–4.5)
Glucose: 164 mg/dL — ABNORMAL HIGH (ref 70–99)
Potassium: 4.5 mmol/L (ref 3.5–5.2)
Sodium: 138 mmol/L (ref 134–144)
Total Protein: 7.4 g/dL (ref 6.0–8.5)
eGFR: 32 mL/min/{1.73_m2} — ABNORMAL LOW (ref >59)

## 2022-03-20 LAB — LIPID PANEL
Chol/HDL Ratio: 2.5 ratio (ref 0.0–4.4)
Cholesterol, Total: 139 mg/dL (ref 100–199)
HDL: 56 mg/dL (ref >39)
LDL Chol Calc (NIH): 56 mg/dL (ref 0–99)
Triglycerides: 163 mg/dL — ABNORMAL HIGH (ref 0–149)
VLDL Cholesterol Cal: 27 mg/dL (ref 5–40)

## 2022-03-20 LAB — HEMOGLOBIN A1C
Est. average glucose Bld gHb Est-mCnc: 154 mg/dL
Hgb A1c MFr Bld: 7 % — ABNORMAL HIGH (ref 4.8–5.6)

## 2022-03-20 LAB — VITAMIN D 25 HYDROXY (VIT D DEFICIENCY, FRACTURES): Vit D, 25-Hydroxy: 45.4 ng/mL (ref 30.0–100.0)

## 2022-03-21 ENCOUNTER — Encounter: Payer: Self-pay | Admitting: Family Medicine

## 2022-03-21 DIAGNOSIS — N183 Chronic kidney disease, stage 3 unspecified: Secondary | ICD-10-CM | POA: Insufficient documentation

## 2022-03-21 MED ORDER — OLMESARTAN MEDOXOMIL 20 MG PO TABS: 20.0000 mg | ORAL_TABLET | Freq: Every day | ORAL | 5 refills | Status: DC

## 2022-03-21 MED ORDER — OXYCODONE-ACETAMINOPHEN 10-325 MG PO TABS: ORAL_TABLET | ORAL | 0 refills | Status: DC

## 2022-03-21 NOTE — Assessment & Plan Note (Signed)
  Patient re-educated about  the importance of commitment to a  minimum of 150 minutes of exercise per week as able.  The importance of healthy food choices with portion control discussed, as well as eating regularly and within a 12 hour window most days. The need to choose "clean , green" food 50 to 75% of the time is discussed, as well as to make water the primary drink and set a goal of 64 ounces water daily.       03/18/2022    3:41 PM 01/09/2020    2:37 PM 11/28/2019    1:29 PM  Weight /BMI  Weight 242 lb 260 lb 280 lb  Height '5\' 7"'$  (1.702 m) '5\' 7"'$  (1.702 m) '5\' 7"'$  (1.702 m)  BMI 37.9 kg/m2 40.72 kg/m2 43.85 kg/m2    Improved, good

## 2022-03-21 NOTE — Assessment & Plan Note (Signed)
Updated lab needed.  

## 2022-03-21 NOTE — Progress Notes (Signed)
[name]     MRN: [id number]      DOB: Jan 01, 1941   HPI, Present in office with sister and niece, sitting up in no distress [name] is here for follow up and re-evaluation of chronic medical conditions, medication management and review of any available recent lab and radiology data.  Preventive health is updated, specifically  Cancer screening and Immunization.    The PT denies any adverse reactions to current medications since the last visit.  There are no new concerns.  There are no specific complaints   ROS Denies recent fever or chills. Denies sinus pressure, nasal congestion, ear pain or sore throat. Denies chest congestion, productive cough or wheezing. Denies chest pains, palpitations and leg swelling Denies abdominal pain, nausea, vomiting,diarrhea or constipation.   C/o dysuria, x 3 days  . Chronic  joint pain, swelling and limitation in mobility. Denies headaches, seizures, c/o lower extremity numbness, tingling. Denies depression, anxiety or insomnia. Denies skin break down or rash.   PE  BP (!) 160/78   Pulse (!) 107   Resp 18   Ht '5\' 7"'$  (1.702 m)   Wt 242 lb (109.8 kg)   SpO2 97%   BMI 37.90 kg/m   Patient alert and oriented and in no cardiopulmonary distress.Wheelchair dependent, non ambulatory  HEENT: No facial asymmetry, EOMI,     Neck decreased ROM  Chest: Clear to auscultation bilaterally.  CVS: S1, S2 no murmurs, no S3.Regular rate.  ABD: Soft non tender.   Ext: No edema  MS: Markedly decreased  ROM spine, shoulders, hips and knees.  Skin: Intact, no ulcerations or rash noted.  Psych: Good eye contact, normal affect.  not anxious or depressed appearing.  CNS: CN 2-12 intact, power,  normal throughout.no focal deficits noted.   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   The need to take medication as prescribed, test blood sugar as directed, and to call between visits if there is a concern that blood sugar is uncontrolled is also discussed.   [name] is reminded of the importance of daily foot exam, annual eye examination, and good blood sugar, blood pressure and cholesterol control.     Latest Ref Rng & Units 03/18/2022    4:57 PM 11/13/2020   12:00 AM 01/09/2020    3:25 PM 11/28/2019    3:01 PM 08/27/2019    3:27 PM  Diabetic Labs  HbA1c 4.8 - 5.6 % 7.0  7.0      9.9  8.5   Chol 100 - 199 mg/dL 139  147      138    HDL >39 mg/dL 56    57    Calc LDL 0 - 99 mg/dL 56  63      56    Triglycerides 0 - 149 mg/dL 163  179      171    Creatinine 0.57 - 1.00 mg/dL 1.58   1.26  1.20  1.18      This result is from an external source.      03/18/2022  3:41 PM 09/15/2021    2:49 PM 08/14/2020    2:48 PM 08/14/2020    2:01 PM 01/10/2020    2:15 AM 01/09/2020   10:10 PM 01/09/2020    8:17 PM  BP/Weight  Systolic BP 062 376 283 151 761 607 371  Diastolic BP 78 72 79 97 82 82 88  Wt. (Lbs) 242        BMI 37.9 kg/m2            Latest Ref Rng & Units 08/19/2020   12:00 AM 11/28/2019    1:40 PM  Foot/eye exam completion dates  Eye Exam No Retinopathy No Retinopathy       Foot Form Completion   Done     This result is from an external source.        Hyperlipidemia LDL goal <100 Hyperlipidemia:Low fat diet discussed and encouraged.   Lipid Panel  Lab Results  Component Value Date   CHOL 139 03/18/2022   HDL 56 03/18/2022   LDLCALC 56 03/18/2022   TRIG 163 (H) 03/18/2022   CHOLHDL 2.5 03/18/2022     No med change, needs to lower fat intake as TG are  elevated  Morbid obesity ([place])  Patient re-educated about  the importance of commitment to a  minimum of 150 minutes of exercise per week as able.  The importance of healthy food choices with portion control discussed, as well as eating regularly and within a 12 hour window most days. The need to choose "clean , green" food 50 to 75% of the time is discussed, as well as to make water the primary drink and set a goal of 64 ounces water daily.       03/18/2022    3:41 PM 01/09/2020    2:37 PM 11/28/2019    1:29 PM  Weight /BMI  Weight 242 lb 260 lb 280 lb  Height '5\' 7"'$  (1.702 m) '5\' 7"'$  (1.702 m) '5\' 7"'$  (1.702 m)  BMI 37.9 kg/m2 40.72 kg/m2 43.85 kg/m2    Improved, good  Memory loss Continue daily aricept  Dysuria CCUA to be submitted  Vitamin D deficiency Updated lab needed .   Encounter for chronic pain management The patient's Controlled Substance registry is reviewed and compliance confirmed. Adequacy of  Pain control and level of function is assessed. Medication dosing is adjusted as deemed appropriate. Twelve weeks of medication is prescribed , with a follow up appointment between 11 to 12 weeks .

## 2022-03-21 NOTE — Assessment & Plan Note (Signed)
CCUA to be submitted

## 2022-03-21 NOTE — Assessment & Plan Note (Signed)
Uncontrolled with decreased kidney function add olmesartan 20 mg daily, will need to contact family after visit with change

## 2022-03-21 NOTE — Assessment & Plan Note (Signed)
Continue daily aricept

## 2022-03-21 NOTE — Assessment & Plan Note (Signed)
Hyperlipidemia:Low fat diet discussed and encouraged.   Lipid Panel  Lab Results  Component Value Date   CHOL 139 03/18/2022   HDL 56 03/18/2022   LDLCALC 56 03/18/2022   TRIG 163 (H) 03/18/2022   CHOLHDL 2.5 03/18/2022     No med change, needs to lower fat intake as TG are elevated

## 2022-03-21 NOTE — Assessment & Plan Note (Signed)
Controlled, no change in medication [name] is reminded of the importance of commitment to daily physical activity for 30 minutes or more, as able and the need to limit carbohydrate intake to 30 to 60 grams per meal to help with blood sugar control.   The need to take medication as prescribed, test blood sugar as directed, and to call between visits if there is a concern that blood sugar is uncontrolled is also discussed.   [name] is reminded of the importance of daily foot exam, annual eye examination, and good blood sugar, blood pressure and cholesterol control.     Latest Ref Rng & Units 03/18/2022    4:57 PM 11/13/2020   12:00 AM 01/09/2020    3:25 PM 11/28/2019    3:01 PM 08/27/2019    3:27 PM  Diabetic Labs  HbA1c 4.8 - 5.6 % 7.0  7.0      9.9  8.5   Chol 100 - 199 mg/dL 139  147      138    HDL >39 mg/dL 56    57    Calc LDL 0 - 99 mg/dL 56  63      56    Triglycerides 0 - 149 mg/dL 163  179      171    Creatinine 0.57 - 1.00 mg/dL 1.58   1.26  1.20  1.18      This result is from an external source.      03/18/2022    3:41 PM 09/15/2021    2:49 PM 08/14/2020    2:48 PM 08/14/2020    2:01 PM 01/10/2020    2:15 AM 01/09/2020   10:10 PM 01/09/2020    8:17 PM  BP/Weight  Systolic BP 751 025 852 778 242 353 614  Diastolic BP 78 72 79 97 82 82 88  Wt. (Lbs) 242        BMI 37.9 kg/m2            Latest Ref Rng & Units 08/19/2020   12:00 AM 11/28/2019    1:40 PM  Foot/eye exam completion dates  Eye Exam No Retinopathy No Retinopathy       Foot Form Completion   Done     This result is from an external source.

## 2022-03-21 NOTE — Assessment & Plan Note (Signed)
The patient's Controlled Substance registry is reviewed and compliance confirmed. Adequacy of  Pain control and level of function is assessed. Medication dosing is adjusted as deemed appropriate. Twelve weeks of medication is prescribed , with a follow up appointment between 11 to 12 weeks .  

## 2022-03-24 ENCOUNTER — Other Ambulatory Visit: Payer: Self-pay

## 2022-03-24 MED ORDER — BUSPIRONE HCL 7.5 MG PO TABS: 7.5000 mg | ORAL_TABLET | Freq: Three times a day (TID) | ORAL | 0 refills | Status: DC

## 2022-03-30 ENCOUNTER — Other Ambulatory Visit: Payer: Self-pay

## 2022-03-30 DIAGNOSIS — R300 Dysuria: Secondary | ICD-10-CM

## 2022-03-31 LAB — MICROSCOPIC EXAMINATION: Casts: NONE SEEN /lpf

## 2022-03-31 LAB — URINALYSIS, ROUTINE W REFLEX MICROSCOPIC
Bilirubin, UA: NEGATIVE
Glucose, UA: NEGATIVE
Ketones, UA: NEGATIVE
Nitrite, UA: NEGATIVE
Protein,UA: NEGATIVE
RBC, UA: NEGATIVE
Specific Gravity, UA: 1.014 (ref 1.005–1.030)
Urobilinogen, Ur: 0.2 mg/dL (ref 0.2–1.0)
pH, UA: 7 (ref 5.0–7.5)

## 2022-04-19 ENCOUNTER — Emergency Department (HOSPITAL_COMMUNITY): Payer: Medicare HMO

## 2022-04-19 ENCOUNTER — Inpatient Hospital Stay (HOSPITAL_COMMUNITY)
Admission: EM | Admit: 2022-04-19 | Discharge: 2022-04-23 | DRG: 378 | Disposition: A | Discharge status: Home Health | Payer: Medicare HMO | Attending: Internal Medicine | Admitting: Internal Medicine

## 2022-04-19 ENCOUNTER — Encounter (HOSPITAL_COMMUNITY): Payer: Self-pay | Admitting: Emergency Medicine

## 2022-04-19 ENCOUNTER — Other Ambulatory Visit: Payer: Self-pay

## 2022-04-19 DIAGNOSIS — E785 Hyperlipidemia, unspecified: Secondary | ICD-10-CM | POA: Diagnosis present

## 2022-04-19 DIAGNOSIS — R062 Wheezing: Secondary | ICD-10-CM | POA: Diagnosis not present

## 2022-04-19 DIAGNOSIS — K219 Gastro-esophageal reflux disease without esophagitis: Secondary | ICD-10-CM | POA: Diagnosis present

## 2022-04-19 DIAGNOSIS — E1122 Type 2 diabetes mellitus with diabetic chronic kidney disease: Secondary | ICD-10-CM | POA: Diagnosis present

## 2022-04-19 DIAGNOSIS — R413 Other amnesia: Secondary | ICD-10-CM | POA: Diagnosis not present

## 2022-04-19 DIAGNOSIS — Z6837 Body mass index (BMI) 37.0-37.9, adult: Secondary | ICD-10-CM

## 2022-04-19 DIAGNOSIS — Z96652 Presence of left artificial knee joint: Secondary | ICD-10-CM | POA: Diagnosis present

## 2022-04-19 DIAGNOSIS — Z8249 Family history of ischemic heart disease and other diseases of the circulatory system: Secondary | ICD-10-CM | POA: Diagnosis not present

## 2022-04-19 DIAGNOSIS — D509 Iron deficiency anemia, unspecified: Secondary | ICD-10-CM | POA: Diagnosis not present

## 2022-04-19 DIAGNOSIS — Z880 Allergy status to penicillin: Secondary | ICD-10-CM

## 2022-04-19 DIAGNOSIS — Z86711 Personal history of pulmonary embolism: Secondary | ICD-10-CM | POA: Diagnosis present

## 2022-04-19 DIAGNOSIS — Z885 Allergy status to narcotic agent status: Secondary | ICD-10-CM | POA: Diagnosis not present

## 2022-04-19 DIAGNOSIS — Z515 Encounter for palliative care: Secondary | ICD-10-CM | POA: Diagnosis not present

## 2022-04-19 DIAGNOSIS — F418 Other specified anxiety disorders: Secondary | ICD-10-CM | POA: Diagnosis present

## 2022-04-19 DIAGNOSIS — Z86718 Personal history of other venous thrombosis and embolism: Secondary | ICD-10-CM | POA: Diagnosis not present

## 2022-04-19 DIAGNOSIS — Z66 Do not resuscitate: Secondary | ICD-10-CM | POA: Diagnosis present

## 2022-04-19 DIAGNOSIS — D62 Acute posthemorrhagic anemia: Secondary | ICD-10-CM | POA: Diagnosis present

## 2022-04-19 DIAGNOSIS — I4891 Unspecified atrial fibrillation: Secondary | ICD-10-CM | POA: Diagnosis present

## 2022-04-19 DIAGNOSIS — K31811 Angiodysplasia of stomach and duodenum with bleeding: Principal | ICD-10-CM | POA: Diagnosis present

## 2022-04-19 DIAGNOSIS — F0394 Unspecified dementia, unspecified severity, with anxiety: Secondary | ICD-10-CM | POA: Diagnosis present

## 2022-04-19 DIAGNOSIS — R1310 Dysphagia, unspecified: Secondary | ICD-10-CM | POA: Diagnosis present

## 2022-04-19 DIAGNOSIS — E1149 Type 2 diabetes mellitus with other diabetic neurological complication: Secondary | ICD-10-CM | POA: Diagnosis present

## 2022-04-19 DIAGNOSIS — K2289 Other specified disease of esophagus: Secondary | ICD-10-CM | POA: Diagnosis not present

## 2022-04-19 DIAGNOSIS — Z79899 Other long term (current) drug therapy: Secondary | ICD-10-CM

## 2022-04-19 DIAGNOSIS — Z882 Allergy status to sulfonamides status: Secondary | ICD-10-CM

## 2022-04-19 DIAGNOSIS — N189 Chronic kidney disease, unspecified: Secondary | ICD-10-CM | POA: Diagnosis not present

## 2022-04-19 DIAGNOSIS — B3781 Candidal esophagitis: Secondary | ICD-10-CM | POA: Diagnosis present

## 2022-04-19 DIAGNOSIS — D649 Anemia, unspecified: Secondary | ICD-10-CM | POA: Diagnosis present

## 2022-04-19 DIAGNOSIS — Z7189 Other specified counseling: Secondary | ICD-10-CM | POA: Diagnosis not present

## 2022-04-19 DIAGNOSIS — Z1152 Encounter for screening for COVID-19: Secondary | ICD-10-CM

## 2022-04-19 DIAGNOSIS — Z794 Long term (current) use of insulin: Secondary | ICD-10-CM | POA: Diagnosis not present

## 2022-04-19 DIAGNOSIS — D631 Anemia in chronic kidney disease: Secondary | ICD-10-CM | POA: Diagnosis not present

## 2022-04-19 DIAGNOSIS — I10 Essential (primary) hypertension: Secondary | ICD-10-CM | POA: Diagnosis not present

## 2022-04-19 DIAGNOSIS — K2970 Gastritis, unspecified, without bleeding: Secondary | ICD-10-CM | POA: Diagnosis not present

## 2022-04-19 DIAGNOSIS — Z7901 Long term (current) use of anticoagulants: Secondary | ICD-10-CM

## 2022-04-19 DIAGNOSIS — K2971 Gastritis, unspecified, with bleeding: Secondary | ICD-10-CM | POA: Diagnosis present

## 2022-04-19 DIAGNOSIS — Z841 Family history of disorders of kidney and ureter: Secondary | ICD-10-CM

## 2022-04-19 DIAGNOSIS — I129 Hypertensive chronic kidney disease with stage 1 through stage 4 chronic kidney disease, or unspecified chronic kidney disease: Secondary | ICD-10-CM | POA: Diagnosis present

## 2022-04-19 DIAGNOSIS — K31819 Angiodysplasia of stomach and duodenum without bleeding: Secondary | ICD-10-CM | POA: Diagnosis not present

## 2022-04-19 DIAGNOSIS — N1832 Chronic kidney disease, stage 3b: Secondary | ICD-10-CM | POA: Diagnosis present

## 2022-04-19 DIAGNOSIS — Z833 Family history of diabetes mellitus: Secondary | ICD-10-CM

## 2022-04-19 DIAGNOSIS — R531 Weakness: Secondary | ICD-10-CM | POA: Diagnosis not present

## 2022-04-19 DIAGNOSIS — N183 Chronic kidney disease, stage 3 unspecified: Secondary | ICD-10-CM | POA: Diagnosis not present

## 2022-04-19 DIAGNOSIS — F0393 Unspecified dementia, unspecified severity, with mood disturbance: Secondary | ICD-10-CM | POA: Diagnosis present

## 2022-04-19 DIAGNOSIS — Z806 Family history of leukemia: Secondary | ICD-10-CM

## 2022-04-19 LAB — CBC
HCT: 15.2 % — ABNORMAL LOW (ref 36.0–46.0)
Hemoglobin: 4.3 g/dL — CL (ref 12.0–15.0)
MCH: 22.6 pg — ABNORMAL LOW (ref 26.0–34.0)
MCHC: 28.3 g/dL — ABNORMAL LOW (ref 30.0–36.0)
MCV: 80 fL (ref 80.0–100.0)
Platelets: 374 10*3/uL (ref 150–400)
RBC: 1.9 MIL/uL — ABNORMAL LOW (ref 3.87–5.11)
RDW: 16.4 % — ABNORMAL HIGH (ref 11.5–15.5)
WBC: 11.7 10*3/uL — ABNORMAL HIGH (ref 4.0–10.5)
nRBC: 0.4 % — ABNORMAL HIGH (ref 0.0–0.2)

## 2022-04-19 LAB — BASIC METABOLIC PANEL
Anion gap: 9 (ref 5–15)
BUN: 24 mg/dL — ABNORMAL HIGH (ref 8–23)
CO2: 24 mmol/L (ref 22–32)
Calcium: 8.4 mg/dL — ABNORMAL LOW (ref 8.9–10.3)
Chloride: 104 mmol/L (ref 98–111)
Creatinine, Ser: 1.5 mg/dL — ABNORMAL HIGH (ref 0.44–1.00)
GFR, Estimated: 35 mL/min — ABNORMAL LOW (ref >60)
Glucose, Bld: 154 mg/dL — ABNORMAL HIGH (ref 70–99)
Potassium: 4 mmol/L (ref 3.5–5.1)
Sodium: 137 mmol/L (ref 135–145)

## 2022-04-19 LAB — PROTIME-INR
INR: 1.3 — ABNORMAL HIGH (ref 0.8–1.2)
Prothrombin Time: 16.5 seconds — ABNORMAL HIGH (ref 11.4–15.2)

## 2022-04-19 LAB — CBG MONITORING, ED: Glucose-Capillary: 153 mg/dL — ABNORMAL HIGH (ref 70–99)

## 2022-04-19 LAB — LACTIC ACID, PLASMA: Lactic Acid, Venous: 1.9 mmol/L (ref 0.5–1.9)

## 2022-04-19 NOTE — ED Provider Notes (Signed)
[place] Provider Note   CSN: [id number] Arrival date & time: 04/19/22  2229     History  Chief Complaint  Patient presents with   Weakness    [name] is a 82 y.o. female.  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  Patient presenting today with complaints of weakness.  She describes generalized weakness that has been worsening over the past 2 weeks.  She denies any specific complaint such as fevers, chills, chest pain, or abdominal pain.  She denies any black or bloody stool.  There are no aggravating or alleviating factors.  The history is provided by the patient.       Home Medications Prior to Admission medications   Medication Sig Start Date End Date Taking? Authorizing Provider  acetaminophen (TYLENOL) 500 MG tablet Take one tablet two times daily for chronic pain Patient taking differently: Take 500 mg by mouth in the morning and at bedtime. for chronic pain 04/19/19   [name], MD  albuterol (VENTOLIN HFA) 108 (90 Base) MCG/ACT inhaler Inhale 2 puffs into the lungs every 6 (six) hours as needed for wheezing or shortness of breath. 08/14/20   [name], MD  amLODipine (NORVASC) 10 MG tablet Take 1 tablet (10 mg total) by mouth daily. 11/24/21   [name], MD  atorvastatin (LIPITOR) 40 MG tablet TAKE 1 TABLET BY MOUTH ONCE DAILY. 02/12/21   [name], MD  azelastine (OPTIVAR) 0.05 % ophthalmic solution Place 1 drop into the right eye 2 (two) times daily. 03/18/22   [name], MD  BD INSULIN SYRINGE U/F 31G X 5/16" 1 ML MISC USE AS DIRECTED 08/10/21   [name], MD  busPIRone (BUSPAR) 7.5 MG tablet Take 1 tablet (7.5 mg total) by mouth 3 (three) times daily. 03/24/22   [name], MD  cloNIDine (CATAPRES) 0.2  MG tablet TAKE ONE TABLET BY MOUTH AT BEDTIME. 06/18/21   [name], MD  clotrimazole-betamethasone (LOTRISONE) cream Apply 1 application topically 2 (two) times daily. D/c vusion ointment 08/14/20   [name], MD  clotrimazole-betamethasone (LOTRISONE) cream Apply twice daily to affected areas for 1 week , then as needed 03/18/22   [name], MD  COMBIGAN 0.2-0.5 % ophthalmic solution Place 1 drop into both eyes 2 (two) times daily. 03/23/17   [provider]  dabigatran (PRADAXA) 150 MG CAPS capsule TAKE ONE CAPSULE BY MOUTH EVERY TWELVE HOURS 03/15/22   [name], MD  docusate sodium (COLACE) 100 MG capsule Take 1 capsule (100 mg total) by mouth daily as needed for mild constipation. Patient taking differently: Take 100 mg by mouth daily. 03/20/18   Hosie Poisson, MD  donepezil (ARICEPT) 10 MG tablet TAKE ONE TABLET BY MOUTH AT BEDTIME. 01/26/22   [name], MD  ergocalciferol (VITAMIN D2) 1.25 MG (50000 UT) capsule Take 1 capsule (50,000 Units total) by mouth once a week. One capsule once weekly 10/30/21   [name], MD  glucose blood (ACCU-CHEK AVIVA PLUS) test strip Use as instructed three times daily dx e11.65 03/18/22   [name], MD  hydrOXYzine (VISTARIL) 25 MG capsule TAKE (1) CAPSULE BY MOUTH THREE TIMES DAILY 01/14/22   [name], MD  imipramine (TOFRANIL) 50 MG tablet TAKE 2 TABLETS BY MOUTH AT BEDTIME 05/08/21  [name], MD  insulin NPH-regular Human (NOVOLIN 70/30) (70-30) 100 UNIT/ML injection INJECT 55 UNITS INTO THE SKIN TWICE DAILY WITH A MEAL 01/14/22   [name], MD  Insulin Pen Needle (ULTICARE MINI PEN NEEDLES) 31G X 6 MM MISC USE TO INJECT NOVOLIN TWICE DAILY. 01/14/22   [name], MD  Insulin Syringe-Needle U-100 32G X 5/16" 1 ML MISC 1 each by Does not apply route in the morning and at bedtime. Use to inject insulin twice daily dx e11.65 05/16/19   Perlie Mayo, NP   meclizine (ANTIVERT) 25 MG tablet Take 1 tablet (25 mg total) by mouth 3 (three) times daily as needed for dizziness. 11/28/19   [name], MD  montelukast (SINGULAIR) 10 MG tablet Take 1 tablet (10 mg total) by mouth at bedtime. 01/14/22   [name], MD  nystatin (MYCOSTATIN/NYSTOP) powder APPLY TOPCIALLY TO RASH UNDER BREASTS ONCE DAILY AS NEEDED. 06/03/21   [name], MD  olmesartan (BENICAR) 20 MG tablet Take 1 tablet (20 mg total) by mouth daily. 03/22/22   [name], MD  olopatadine (PATANOL) 0.1 % ophthalmic solution Place 1 drop into both eyes 2 (two) times daily. 03/18/22   [name], MD  omeprazole (PRILOSEC) 40 MG capsule TAKE ONE CAPSULE BY MOUTH TWICE DAILY 03/01/22   [name], MD  oxyCODONE-acetaminophen [facility]) 10-325 MG tablet Take one tablet by moth two times daily for pain 07/19/21   [name], MD  oxyCODONE-acetaminophen (PERCOCET) 10-325 MG tablet Take one tablet by mouth two times daily for generalized pain 11/20/21   [name], MD  oxyCODONE-acetaminophen [facility]) 10-325 MG tablet Take one tablet by mouth twice daily for chronic pain 03/24/22 04/23/22  [name], MD  oxyCODONE-acetaminophen (PERCOCET) 10-325 MG tablet Take one tablet by mouth two times daily for chronic pain 04/23/22 05/23/22  [name], MD  oxyCODONE-acetaminophen (PERCOCET) 10-325 MG tablet Take one tablet by mouth two times daily for chronic pain 05/23/22   [name], MD  SUMAtriptan (IMITREX) 25 MG tablet TAKE 1 TABLET BY MOUTH AS NEEDED FOR MIGRAINE. MAY REPEAT IN 2 HOURS IF HEADACHE PERSISTS OR RECURS. 06/04/20   [name], MD  topiramate (TOPAMAX) 100 MG tablet TAKE 1 TABLET BY MOUTH TWICE DAILY 02/16/22   [name], MD  UNABLE TO FIND Standard wheelchair with elevating leg rests DX M25.561, M54.41 03/12/20   [name], MD  UNABLE TO FIND Skilled Nursing to eval and treat as  indicated. Will need routine lab work and vital sign assessments. 08/14/20   [name], MD  UNABLE TO [place] to evaluate and treat as indicated. Dx: Z74.2 08/27/20   [name], MD  UNABLE TO FIND Med Name: toxassure 8 dx: PAIN MANAGEMENT CONTRACT Z08.89 09/10/21   [name], MD      Allergies    Penicillins, Fentanyl, Sulfonamide derivatives, Sulfur, and Codeine    Review of Systems   Review of Systems  All other systems reviewed and are negative.   Physical Exam Updated Vital Signs BP (!) 176/78 (BP Location: Right Arm)   Pulse 92   Temp 99.1 F (37.3 C) (Oral)   Resp (!) 23   Ht '5\' 7"'$  (1.702 m)   Wt 109.8 kg   SpO2 94%   BMI 37.91 kg/m  Physical Exam Vitals and nursing note reviewed.  Constitutional:      General: She  is not in acute distress.    Appearance: She is well-developed. She is not diaphoretic.  HENT:     Head: Normocephalic and atraumatic.  Cardiovascular:     Rate and Rhythm: Normal rate and regular rhythm.     Heart sounds: No murmur heard.    No friction rub. No gallop.  Pulmonary:     Effort: Pulmonary effort is normal. No respiratory distress.     Breath sounds: Normal breath sounds. No wheezing.  Abdominal:     General: Bowel sounds are normal. There is no distension.     Palpations: Abdomen is soft.     Tenderness: There is no abdominal tenderness.  Genitourinary:    Rectum: Normal.     Comments: Stool is Hemoccult positive. Musculoskeletal:        General: Normal range of motion.     Cervical back: Normal range of motion and neck supple.  Skin:    General: Skin is warm and dry.  Neurological:     General: No focal deficit present.     Mental Status: She is alert and oriented to person, place, and time.     ED Results / Procedures / Treatments   Labs (all labs ordered are listed, but only abnormal results are displayed) Labs Reviewed  BASIC METABOLIC PANEL - Abnormal; Notable for the following  components:      Result Value   Glucose, Bld 154 (*)    BUN 24 (*)    Creatinine, Ser 1.50 (*)    Calcium 8.4 (*)    GFR, Estimated 35 (*)    All other components within normal limits  CBC - Abnormal; Notable for the following components:   WBC 11.7 (*)    RBC 1.90 (*)    Hemoglobin 4.3 (*)    HCT 15.2 (*)    MCH 22.6 (*)    MCHC 28.3 (*)    RDW 16.4 (*)    nRBC 0.4 (*)    All other components within normal limits  PROTIME-INR - Abnormal; Notable for the following components:   Prothrombin Time 16.5 (*)    INR 1.3 (*)    All other components within normal limits  CBG MONITORING, ED - Abnormal; Notable for the following components:   Glucose-Capillary 153 (*)    All other components within normal limits  RESP PANEL BY RT-PCR (RSV, FLU A&B, COVID)  [id number]  CULTURE, BLOOD (ROUTINE X 2)  CULTURE, BLOOD (ROUTINE X 2)  LACTIC ACID, PLASMA  URINALYSIS, ROUTINE W REFLEX MICROSCOPIC  LACTIC ACID, PLASMA  POC OCCULT BLOOD, ED    EKG EKG Interpretation  Date/Time:  Monday April 19 2022 22:55:37 EST Ventricular Rate:  92 PR Interval:  168 QRS Duration: 98 QT Interval:  357 QTC Calculation: 442 R Axis:   23 Text Interpretation: Sinus rhythm Atrial premature complex Abnormal R-wave progression, early transition Probable left ventricular hypertrophy No significant change since prior 9/20 Confirmed by Aletta Edouard [phone]) on 04/19/2022 11:03:01 PM  Radiology DG Chest Port 1 View  Result Date: 04/19/2022 CLINICAL DATA:  Weakness. EXAM: PORTABLE CHEST 1 VIEW COMPARISON:  Chest radiograph dated 11/10/2018 and CT dated 11/10/2018 FINDINGS: The patient is rotated. No focal consolidation, pleural effusion, pneumothorax. Mild cardiomegaly. There is widened appearance of mediastinum, likely related to rotation. Clinical correlation is recommended. No acute osseous pathology. IMPRESSION: 1. No acute cardiopulmonary process. 2. Mild cardiomegaly. 3. Widened appearance of mediastinum, likely  related to patient's rotation. Electronically Signed   By: Milas Hock  Radparvar M.D.   On: 04/19/2022 23:16    Procedures Procedures  {Document cardiac monitor, telemetry assessment procedure when appropriate:1}  Medications Ordered in ED Medications - No data to display  ED Course/ Medical Decision Making/ A&P   {   Click here for ABCD2, HEART and other calculatorsREFRESH Note before signing :1}                          Medical Decision Making Amount and/or Complexity of Data Reviewed Labs: ordered.   ***  {Document critical care time when appropriate:1} {Document review of labs and clinical decision tools ie heart score, Chads2Vasc2 etc:1}  {Document your independent review of radiology images, and any outside records:1} {Document your discussion with family members, caretakers, and with consultants:1} {Document social determinants of health affecting pt's care:1} {Document your decision making why or why not admission, treatments were needed:1} Final Clinical Impression(s) / ED Diagnoses Final diagnoses:  None    Rx / DC Orders ED Discharge Orders     None

## 2022-04-19 NOTE — ED Triage Notes (Signed)
Pt bib EMS for c/o generalized weakness x 2 weeks. Pt with hx of frequent UTI's per family and EMS states "there was a strong odor of urine in the house". Blood glucose per EMS was 197. Pt wears home O2 of 2L [place] at all times.

## 2022-04-20 ENCOUNTER — Inpatient Hospital Stay (HOSPITAL_COMMUNITY): Payer: Medicare HMO

## 2022-04-20 ENCOUNTER — Encounter (HOSPITAL_COMMUNITY): Payer: Self-pay | Admitting: Family Medicine

## 2022-04-20 DIAGNOSIS — D62 Acute posthemorrhagic anemia: Secondary | ICD-10-CM | POA: Diagnosis present

## 2022-04-20 DIAGNOSIS — Z515 Encounter for palliative care: Secondary | ICD-10-CM | POA: Diagnosis not present

## 2022-04-20 DIAGNOSIS — Z8249 Family history of ischemic heart disease and other diseases of the circulatory system: Secondary | ICD-10-CM | POA: Diagnosis not present

## 2022-04-20 DIAGNOSIS — Z885 Allergy status to narcotic agent status: Secondary | ICD-10-CM | POA: Diagnosis not present

## 2022-04-20 DIAGNOSIS — K2971 Gastritis, unspecified, with bleeding: Secondary | ICD-10-CM | POA: Diagnosis present

## 2022-04-20 DIAGNOSIS — E1122 Type 2 diabetes mellitus with diabetic chronic kidney disease: Secondary | ICD-10-CM | POA: Diagnosis present

## 2022-04-20 DIAGNOSIS — Z66 Do not resuscitate: Secondary | ICD-10-CM

## 2022-04-20 DIAGNOSIS — Z79899 Other long term (current) drug therapy: Secondary | ICD-10-CM | POA: Diagnosis not present

## 2022-04-20 DIAGNOSIS — B3781 Candidal esophagitis: Secondary | ICD-10-CM | POA: Diagnosis present

## 2022-04-20 DIAGNOSIS — F418 Other specified anxiety disorders: Secondary | ICD-10-CM | POA: Diagnosis present

## 2022-04-20 DIAGNOSIS — K2970 Gastritis, unspecified, without bleeding: Secondary | ICD-10-CM | POA: Diagnosis not present

## 2022-04-20 DIAGNOSIS — R413 Other amnesia: Secondary | ICD-10-CM

## 2022-04-20 DIAGNOSIS — Z7189 Other specified counseling: Secondary | ICD-10-CM | POA: Diagnosis not present

## 2022-04-20 DIAGNOSIS — I10 Essential (primary) hypertension: Secondary | ICD-10-CM

## 2022-04-20 DIAGNOSIS — E785 Hyperlipidemia, unspecified: Secondary | ICD-10-CM | POA: Diagnosis present

## 2022-04-20 DIAGNOSIS — D649 Anemia, unspecified: Secondary | ICD-10-CM | POA: Diagnosis present

## 2022-04-20 DIAGNOSIS — K2289 Other specified disease of esophagus: Secondary | ICD-10-CM | POA: Diagnosis not present

## 2022-04-20 DIAGNOSIS — F0394 Unspecified dementia, unspecified severity, with anxiety: Secondary | ICD-10-CM | POA: Diagnosis present

## 2022-04-20 DIAGNOSIS — K31811 Angiodysplasia of stomach and duodenum with bleeding: Secondary | ICD-10-CM | POA: Diagnosis present

## 2022-04-20 DIAGNOSIS — Z1152 Encounter for screening for COVID-19: Secondary | ICD-10-CM | POA: Diagnosis not present

## 2022-04-20 DIAGNOSIS — I4891 Unspecified atrial fibrillation: Secondary | ICD-10-CM | POA: Diagnosis present

## 2022-04-20 DIAGNOSIS — E1149 Type 2 diabetes mellitus with other diabetic neurological complication: Secondary | ICD-10-CM | POA: Diagnosis present

## 2022-04-20 DIAGNOSIS — N1832 Chronic kidney disease, stage 3b: Secondary | ICD-10-CM | POA: Diagnosis not present

## 2022-04-20 DIAGNOSIS — Z86718 Personal history of other venous thrombosis and embolism: Secondary | ICD-10-CM | POA: Diagnosis not present

## 2022-04-20 DIAGNOSIS — K31819 Angiodysplasia of stomach and duodenum without bleeding: Secondary | ICD-10-CM | POA: Diagnosis not present

## 2022-04-20 DIAGNOSIS — I129 Hypertensive chronic kidney disease with stage 1 through stage 4 chronic kidney disease, or unspecified chronic kidney disease: Secondary | ICD-10-CM | POA: Diagnosis present

## 2022-04-20 DIAGNOSIS — D509 Iron deficiency anemia, unspecified: Secondary | ICD-10-CM | POA: Diagnosis not present

## 2022-04-20 DIAGNOSIS — Z882 Allergy status to sulfonamides status: Secondary | ICD-10-CM | POA: Diagnosis not present

## 2022-04-20 DIAGNOSIS — Z86711 Personal history of pulmonary embolism: Secondary | ICD-10-CM

## 2022-04-20 DIAGNOSIS — R062 Wheezing: Secondary | ICD-10-CM | POA: Diagnosis not present

## 2022-04-20 DIAGNOSIS — Z880 Allergy status to penicillin: Secondary | ICD-10-CM | POA: Diagnosis not present

## 2022-04-20 DIAGNOSIS — F0393 Unspecified dementia, unspecified severity, with mood disturbance: Secondary | ICD-10-CM | POA: Diagnosis present

## 2022-04-20 DIAGNOSIS — Z794 Long term (current) use of insulin: Secondary | ICD-10-CM | POA: Diagnosis not present

## 2022-04-20 DIAGNOSIS — K219 Gastro-esophageal reflux disease without esophagitis: Secondary | ICD-10-CM | POA: Diagnosis present

## 2022-04-20 LAB — URINALYSIS, ROUTINE W REFLEX MICROSCOPIC
Bilirubin Urine: NEGATIVE
Glucose, UA: NEGATIVE mg/dL
Hgb urine dipstick: NEGATIVE
Ketones, ur: NEGATIVE mg/dL
Nitrite: NEGATIVE
Protein, ur: 30 mg/dL — AB
RBC / HPF: >50 RBC/hpf (ref 0–5)
Specific Gravity, Urine: 1.014 (ref 1.005–1.030)
pH: 6 (ref 5.0–8.0)

## 2022-04-20 LAB — GLUCOSE, CAPILLARY
Glucose-Capillary: 142 mg/dL — ABNORMAL HIGH (ref 70–99)
Glucose-Capillary: 132 mg/dL — ABNORMAL HIGH (ref 70–99)
Glucose-Capillary: 147 mg/dL — ABNORMAL HIGH (ref 70–99)

## 2022-04-20 LAB — COMPREHENSIVE METABOLIC PANEL
ALT: 11 U/L (ref 0–44)
AST: 15 U/L (ref 15–41)
Albumin: 3.2 g/dL — ABNORMAL LOW (ref 3.5–5.0)
Alkaline Phosphatase: 99 U/L (ref 38–126)
Anion gap: 13 (ref 5–15)
BUN: 19 mg/dL (ref 8–23)
CO2: 22 mmol/L (ref 22–32)
Calcium: 8.3 mg/dL — ABNORMAL LOW (ref 8.9–10.3)
Chloride: 103 mmol/L (ref 98–111)
Creatinine, Ser: 1.31 mg/dL — ABNORMAL HIGH (ref 0.44–1.00)
GFR, Estimated: 41 mL/min — ABNORMAL LOW (ref >60)
Glucose, Bld: 167 mg/dL — ABNORMAL HIGH (ref 70–99)
Potassium: 4.1 mmol/L (ref 3.5–5.1)
Sodium: 138 mmol/L (ref 135–145)
Total Bilirubin: 0.1 mg/dL — ABNORMAL LOW (ref 0.3–1.2)
Total Protein: 7.1 g/dL (ref 6.5–8.1)

## 2022-04-20 LAB — CBC WITH DIFFERENTIAL/PLATELET
Abs Immature Granulocytes: 0.25 10*3/uL — ABNORMAL HIGH (ref 0.00–0.07)
Basophils Absolute: 0 10*3/uL (ref 0.0–0.1)
Basophils Relative: 0 %
Eosinophils Absolute: 0 10*3/uL (ref 0.0–0.5)
Eosinophils Relative: 0 %
HCT: 24.4 % — ABNORMAL LOW (ref 36.0–46.0)
Hemoglobin: 7.7 g/dL — ABNORMAL LOW (ref 12.0–15.0)
Immature Granulocytes: 2 %
Lymphocytes Relative: 13 %
Lymphs Abs: 1.6 10*3/uL (ref 0.7–4.0)
MCH: 25.7 pg — ABNORMAL LOW (ref 26.0–34.0)
MCHC: 31.6 g/dL (ref 30.0–36.0)
MCV: 81.3 fL (ref 80.0–100.0)
Monocytes Absolute: 0.8 10*3/uL (ref 0.1–1.0)
Monocytes Relative: 7 %
Neutro Abs: 9.2 10*3/uL — ABNORMAL HIGH (ref 1.7–7.7)
Neutrophils Relative %: 78 %
Platelets: 350 10*3/uL (ref 150–400)
RBC: 3 MIL/uL — ABNORMAL LOW (ref 3.87–5.11)
RDW: 15.6 % — ABNORMAL HIGH (ref 11.5–15.5)
WBC: 11.9 10*3/uL — ABNORMAL HIGH (ref 4.0–10.5)
nRBC: 0.5 % — ABNORMAL HIGH (ref 0.0–0.2)

## 2022-04-20 LAB — FOLATE: Folate: 11.6 ng/mL (ref >5.9)

## 2022-04-20 LAB — RETICULOCYTES
Immature Retic Fract: 9.8 % (ref 2.3–15.9)
RBC.: 2.99 MIL/uL — ABNORMAL LOW (ref 3.87–5.11)
Retic Count, Absolute: 48.1 10*3/uL (ref 19.0–186.0)
Retic Ct Pct: 1.6 % (ref 0.4–3.1)

## 2022-04-20 LAB — LACTIC ACID, PLASMA: Lactic Acid, Venous: 1.4 mmol/L (ref 0.5–1.9)

## 2022-04-20 LAB — VITAMIN B12: Vitamin B-12: 224 pg/mL (ref 180–914)

## 2022-04-20 LAB — RESP PANEL BY RT-PCR (RSV, FLU A&B, COVID)  RVPGX2
Influenza A by PCR: NEGATIVE
Influenza B by PCR: NEGATIVE
Resp Syncytial Virus by PCR: NEGATIVE
SARS Coronavirus 2 by RT PCR: NEGATIVE

## 2022-04-20 LAB — CBG MONITORING, ED: Glucose-Capillary: 108 mg/dL — ABNORMAL HIGH (ref 70–99)

## 2022-04-20 LAB — IRON AND TIBC
Iron: 53 ug/dL (ref 28–170)
Saturation Ratios: 14 % (ref 10.4–31.8)
TIBC: 378 ug/dL (ref 250–450)
UIBC: 325 ug/dL

## 2022-04-20 LAB — MRSA NEXT GEN BY PCR, NASAL: MRSA by PCR Next Gen: NOT DETECTED

## 2022-04-20 LAB — MAGNESIUM: Magnesium: 2.1 mg/dL (ref 1.7–2.4)

## 2022-04-20 LAB — PREPARE RBC (CROSSMATCH)

## 2022-04-20 LAB — FERRITIN: Ferritin: 11 ng/mL (ref 11–307)

## 2022-04-20 MED ORDER — ACETAMINOPHEN 650 MG RE SUPP: 650.0000 mg | Freq: Four times a day (QID) | RECTAL | Status: DC | PRN

## 2022-04-20 MED ORDER — AMLODIPINE BESYLATE 5 MG PO TABS
10.0000 mg | ORAL_TABLET | Freq: Every day | ORAL | Status: DC
  Administered 2022-04-20: 10 mg via ORAL
  Filled 2022-04-20: qty 2

## 2022-04-20 MED ORDER — OXYCODONE HCL 5 MG PO TABS
5.0000 mg | ORAL_TABLET | ORAL | Status: DC | PRN
  Administered 2022-04-21 – 2022-04-23 (×4): 5 mg via ORAL
  Filled 2022-04-20 (×5): qty 1

## 2022-04-20 MED ORDER — SODIUM CHLORIDE 0.9% IV SOLUTION: Freq: Once | INTRAVENOUS | Status: DC

## 2022-04-20 MED ORDER — AMLODIPINE BESYLATE 5 MG PO TABS
5.0000 mg | ORAL_TABLET | Freq: Every day | ORAL | Status: DC
  Administered 2022-04-21: 5 mg via ORAL
  Filled 2022-04-20: qty 1

## 2022-04-20 MED ORDER — CLONIDINE HCL 0.2 MG PO TABS: 0.2000 mg | ORAL_TABLET | Freq: Every day | ORAL | Status: DC

## 2022-04-20 MED ORDER — FUROSEMIDE 10 MG/ML IJ SOLN
40.0000 mg | Freq: Once | INTRAMUSCULAR | Status: AC
  Administered 2022-04-20: 40 mg via INTRAVENOUS
  Filled 2022-04-20: qty 4

## 2022-04-20 MED ORDER — INSULIN ASPART 100 UNIT/ML IJ SOLN
0.0000 [IU] | Freq: Three times a day (TID) | INTRAMUSCULAR | Status: DC
  Administered 2022-04-20: 2 [IU] via SUBCUTANEOUS
  Administered 2022-04-20 – 2022-04-22 (×5): 3 [IU] via SUBCUTANEOUS
  Administered 2022-04-22 (×2): 2 [IU] via SUBCUTANEOUS
  Administered 2022-04-23: 3 [IU] via SUBCUTANEOUS

## 2022-04-20 MED ORDER — ONDANSETRON HCL 4 MG PO TABS: 4.0000 mg | ORAL_TABLET | Freq: Four times a day (QID) | ORAL | Status: DC | PRN

## 2022-04-20 MED ORDER — PANTOPRAZOLE SODIUM 40 MG IV SOLR
40.0000 mg | Freq: Two times a day (BID) | INTRAVENOUS | Status: DC
  Administered 2022-04-20 – 2022-04-23 (×7): 40 mg via INTRAVENOUS
  Filled 2022-04-20 (×7): qty 10

## 2022-04-20 MED ORDER — GUAIFENESIN-DM 100-10 MG/5ML PO SYRP
5.0000 mL | ORAL_SOLUTION | ORAL | Status: DC | PRN
  Administered 2022-04-21 – 2022-04-22 (×3): 5 mL via ORAL
  Filled 2022-04-20 (×3): qty 5

## 2022-04-20 MED ORDER — IMIPRAMINE HCL 25 MG PO TABS
100.0000 mg | ORAL_TABLET | Freq: Every day | ORAL | Status: DC
  Administered 2022-04-20: 100 mg via ORAL
  Filled 2022-04-20 (×2): qty 4
  Filled 2022-04-20: qty 2

## 2022-04-20 MED ORDER — SODIUM CHLORIDE 0.9 % IV SOLN
1.0000 g | INTRAVENOUS | Status: DC
  Administered 2022-04-20: 1 g via INTRAVENOUS
  Filled 2022-04-20: qty 10

## 2022-04-20 MED ORDER — ACETAMINOPHEN 325 MG PO TABS
650.0000 mg | ORAL_TABLET | Freq: Four times a day (QID) | ORAL | Status: DC | PRN
  Administered 2022-04-20 – 2022-04-23 (×4): 650 mg via ORAL
  Filled 2022-04-20 (×4): qty 2

## 2022-04-20 MED ORDER — DIPHENHYDRAMINE HCL 50 MG/ML IJ SOLN: 25.0000 mg | Freq: Four times a day (QID) | INTRAMUSCULAR | Status: DC | PRN

## 2022-04-20 MED ORDER — HYDROXYZINE HCL 25 MG PO TABS
25.0000 mg | ORAL_TABLET | Freq: Three times a day (TID) | ORAL | Status: DC
  Administered 2022-04-20: 25 mg via ORAL
  Filled 2022-04-20: qty 1

## 2022-04-20 MED ORDER — HYDRALAZINE HCL 20 MG/ML IJ SOLN
10.0000 mg | Freq: Once | INTRAMUSCULAR | Status: AC
  Administered 2022-04-20: 10 mg via INTRAVENOUS
  Filled 2022-04-20: qty 1

## 2022-04-20 MED ORDER — BUSPIRONE HCL 5 MG PO TABS
7.5000 mg | ORAL_TABLET | Freq: Three times a day (TID) | ORAL | Status: DC
  Administered 2022-04-20 – 2022-04-21 (×4): 7.5 mg via ORAL
  Filled 2022-04-20 (×4): qty 2

## 2022-04-20 MED ORDER — MONTELUKAST SODIUM 10 MG PO TABS: 10.0000 mg | ORAL_TABLET | Freq: Every day | ORAL | Status: DC

## 2022-04-20 MED ORDER — MORPHINE SULFATE (PF) 2 MG/ML IV SOLN: 2.0000 mg | INTRAVENOUS | Status: DC | PRN

## 2022-04-20 MED ORDER — PANTOPRAZOLE SODIUM 40 MG PO TBEC: 40.0000 mg | DELAYED_RELEASE_TABLET | Freq: Every day | ORAL | Status: DC

## 2022-04-20 MED ORDER — HYDROXYZINE HCL 25 MG PO TABS
25.0000 mg | ORAL_TABLET | Freq: Three times a day (TID) | ORAL | Status: DC | PRN
  Administered 2022-04-22: 25 mg via ORAL
  Filled 2022-04-20: qty 1

## 2022-04-20 MED ORDER — ATORVASTATIN CALCIUM 40 MG PO TABS
40.0000 mg | ORAL_TABLET | Freq: Every day | ORAL | Status: DC
  Administered 2022-04-20 – 2022-04-23 (×4): 40 mg via ORAL
  Filled 2022-04-20 (×4): qty 1

## 2022-04-20 MED ORDER — DONEPEZIL HCL 5 MG PO TABS
10.0000 mg | ORAL_TABLET | Freq: Every day | ORAL | Status: DC
  Administered 2022-04-20 – 2022-04-22 (×3): 10 mg via ORAL
  Filled 2022-04-20 (×3): qty 2

## 2022-04-20 MED ORDER — INSULIN DETEMIR 100 UNIT/ML ~~LOC~~ SOLN
20.0000 [IU] | Freq: Every day | SUBCUTANEOUS | Status: DC
  Administered 2022-04-20 – 2022-04-22 (×3): 20 [IU] via SUBCUTANEOUS
  Filled 2022-04-20 (×4): qty 0.2

## 2022-04-20 MED ORDER — CHLORHEXIDINE GLUCONATE CLOTH 2 % EX PADS
6.0000 | MEDICATED_PAD | Freq: Every day | CUTANEOUS | Status: DC
  Administered 2022-04-20 – 2022-04-23 (×4): 6 via TOPICAL

## 2022-04-20 MED ORDER — INSULIN ASPART 100 UNIT/ML IJ SOLN
0.0000 [IU] | Freq: Every day | INTRAMUSCULAR | Status: DC
  Administered 2022-04-22: 2 [IU] via SUBCUTANEOUS

## 2022-04-20 MED ORDER — ONDANSETRON HCL 4 MG/2ML IJ SOLN: 4.0000 mg | Freq: Four times a day (QID) | INTRAMUSCULAR | Status: DC | PRN

## 2022-04-20 MED ORDER — IRBESARTAN 150 MG PO TABS
150.0000 mg | ORAL_TABLET | Freq: Every day | ORAL | Status: DC
  Administered 2022-04-20 – 2022-04-23 (×4): 150 mg via ORAL
  Filled 2022-04-20 (×4): qty 1

## 2022-04-20 NOTE — ED Notes (Signed)
Pt continues to bend arm, pt encouraged to keep her arm straight d/t IV placement so that Blood infusion will occur within appropriate time

## 2022-04-20 NOTE — Progress Notes (Signed)
PROGRESS NOTE    Patient: [name]                            PCP: [name], MD                    DOB: [birth year]            DOA: 04/19/2022 [id number]             DOS: 04/20/2022, 1:07 PM   LOS: 0 days   Date of Service: The patient was seen and examined on 04/20/2022  Subjective:   The patient was seen and examined this morning. Hemodynamically stable. Tolerating blood transfusion, No issues overnight .  Hemoglobin 4.3>> 3U PRBC >> 7.7 now  Brief Narrative:   [name] is a 82 y.o. female with medical history significant of iron deficiency anemia, GI bleed, PE on Pradaxa, history of atrial fibrillation, depression, memory loss, hypertension, hyperlipidemia, diabetes mellitus type 2, and more --- presents the ED with a chief complaint of generalized weakness and fell  for several weeks.  It has been progressively worse since it started.   Son at bedside reports that she ambulates with a walker from her chair to the commode.  The rest the time she uses a wheelchair.  She sleeps in a recliner chair at night.  She has been more short of breath with exertion.  Patient reports that she did have chest pain prior to coming to the ER but it resolved spontaneously.  Son notes that patient was wheezing.   She denies any bruising, hematuria, hematemesis, melena, hematochezia, epistaxis, or blood anywhere it should be.  She denies paresthesias.  She reports she does sometimes get dizzy upon standing but that is not new.  Patient does wear 2 L nasal cannula at baseline and that is what she is requiring in the ER. Her blood was a transfusion in March 2021.   On chart review patient had Hemoccult positive stools during that admission.  She was already on Pradaxa at that point as well.  Pradaxa was held and patient was transfused with 2 units.  GI consultation was done because patient had had previously identified duodenal lesions that had been bleeding.  EGD was done, and  colonoscopy as well.  Patient was noted to have chronic findings that would be consistent with an acute bleed.  Patient was discharged home and restarted Pradaxa.    Patient's baseline hemoglobin seems to be around 9, but it is really all over the place.  Today it is 4.3.  Son at bedside reports she had been on an iron pill in the past, but is not on one currently and he does not know why it was discontinued.   ED:  Temp 99.1, heart rate 88-92, respiratory rate 17-23, blood pressure 165/69-176/78, satting 94-98% Borderline leukocytosis at 11.7, hemoglobin extremely low at 4.3, platelets 374 Chemistry shows a creatinine of 1.50 which is at this patient's baseline Lactic acid 1.9 3 units blood transfusion ordered Chest x-ray shows no acute cardiopulmonary disease EKG shows a heart rate of 92, sinus rhythm, QTc 442 FOBT negative with brown stool Admission requested for acute anemia       Assessment & Plan:   Principal Problem:   Acute anemia Active Problems:   Hyperlipidemia LDL goal <100   Essential hypertension   Memory loss   Depression with anxiety   Type 2 diabetes  mellitus with neurological complications (HCC)   History of pulmonary embolism   CKD stage G3b/A1, 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  March 2021.  At that time she had Hemoccult positive stools and required a blood transfusion.  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) - Creatinine is at baseline 1.50 - Continue to monitor Lab Results  Component Value Date   CREATININE 1.31 (H) 04/20/2022   CREATININE 1.50 (H) 04/19/2022   CREATININE 1.58 (H) 03/18/2022     History of pulmonary embolism - Holding Pradaxa due to acute anemia  Type 2 diabetes mellitus with neurological complications ([place]) - Patient is on 70/30, 55 units at baseline - Reduced to 20 units basal insulin with sliding scale coverage  - Continue to monitor CBGs CBG (last 3)  Recent Labs    04/19/22 2254 04/20/22 0749 04/20/22 1159  GLUCAP 153* 108* 142*     Depression with anxiety - Continue BuSpar and imipramine  Memory loss - Continue Aricept  Essential hypertension - Continue Norvasc, clonidine, ARB  Hyperlipidemia LDL goal <100 - Continue Lipitor    -------------------------------------------------------------------------------------------------------------------------  DVT prophylaxis:  SCDs Start: 04/20/22 0456   Code Status:   Code Status: DNR  Family Communication:  Called her Sister [name] - update 04/20/2022   The above findings and plan of care has been discussed with patient (sister)  in detail,  they expressed understanding and agreement of above. -Advance care planning has been discussed.   Admission status:   Status is: Inpatient Remains inpatient appropriate because: Severe anemia, needing blood transfusion, subspecialty GI consultation   Disposition: From  - home             Planning for discharge in 2 days: Home   Procedures:   No admission procedures for hospital  encounter.   Antimicrobials:  Anti-infectives (From admission, onward)    None        Medication:   sodium chloride   Intravenous Once   amLODipine  10 mg Oral Daily   atorvastatin  40 mg Oral Daily   busPIRone  7.5 mg Oral TID   Chlorhexidine Gluconate Cloth  6 each Topical Daily   cloNIDine  0.2 mg Oral QHS   donepezil  10 mg Oral QHS   hydrOXYzine  25 mg Oral TID   imipramine  100 mg Oral QHS   insulin aspart  0-15 Units Subcutaneous TID WC   insulin aspart  0-5 Units Subcutaneous QHS  insulin detemir  20 Units Subcutaneous QHS   irbesartan  150 mg Oral Daily   montelukast  10 mg Oral QHS   pantoprazole (PROTONIX) IV  40 mg Intravenous Q12H    acetaminophen **OR** acetaminophen, diphenhydrAMINE, morphine injection, ondansetron **OR** ondansetron (ZOFRAN) IV, oxyCODONE   Objective:   Vitals:   04/20/22 1110 04/20/22 1112 04/20/22 1200 04/20/22 1212  BP: (!) 162/58 (!) 162/58 (!) 108/54   Pulse: 84     Resp: 18  (!) 21   Temp: 98.1 F (36.7 C)   98.2 F (36.8 C)  TempSrc: Oral   Oral  SpO2: 100%     Weight: 110.2 kg     Height: '5\' 7"'$  (1.702 m)       Intake/Output Summary (Last 24 hours) at 04/20/2022 1307 Last data filed at 04/20/2022 1304 Gross per 24 hour  Intake 1090 ml  Output 800 ml  Net 290 ml   Filed Weights   04/19/22 2238 04/20/22 1110  Weight: 109.8 kg 110.2 kg     Physical examination:   Constitution: Pleasantly confused cooperative, no distress,  Appears calm and comfortable  Psychiatric:   Normal and stable mood and affect, cognition intact,   HEENT:        Normocephalic, PERRL, otherwise with in Normal limits  Chest:         Chest symmetric Cardio vascular:  S1/S2, RRR, No murmure, No Rubs or Gallops  pulmonary: Clear to auscultation bilaterally, respirations unlabored, negative wheezes / crackles Abdomen: Soft, non-tender, non-distended, bowel sounds,no masses, no organomegaly Muscular skeletal: Global generalized weakness  Limited  exam - in bed, able to move all 4 extremities,   Neuro: CNII-XII intact. , normal motor and sensation, reflexes intact  Extremities: No pitting edema lower extremities, +2 pulses  Skin: Dry, warm to touch, negative for any Rashes, No open wounds Wounds: per nursing documentation   ------------------------------------------------------------------------------------------------------------------------------------------    LABs:     Latest Ref Rng & Units 04/20/2022   11:28 AM 04/19/2022   10:55 PM 01/09/2020    3:25 PM  CBC  WBC 4.0 - 10.5 K/uL 11.9  11.7  9.8   Hemoglobin 12.0 - 15.0 g/dL 7.7  4.3  13.2   Hematocrit 36.0 - 46.0 % 24.4  15.2  42.1   Platelets 150 - 400 K/uL 350  374  312       Latest Ref Rng & Units 04/20/2022   11:28 AM 04/19/2022   10:55 PM 03/18/2022    4:57 PM  CMP  Glucose 70 - 99 mg/dL 167  154  164   BUN 8 - 23 mg/dL '19  24  21   '$ Creatinine 0.44 - 1.00 mg/dL 1.31  1.50  1.58   Sodium 135 - 145 mmol/L 138  137  138   Potassium 3.5 - 5.1 mmol/L 4.1  4.0  4.5   Chloride 98 - 111 mmol/L 103  104  104   CO2 22 - 32 mmol/L '22  24  21   '$ Calcium 8.9 - 10.3 mg/dL 8.3  8.4  9.0   Total Protein 6.5 - 8.1 g/dL 7.1   7.4   Total Bilirubin 0.3 - 1.2 mg/dL 0.1   <0.2   Alkaline Phos 38 - 126 U/L 99   160   AST 15 - 41 U/L 15   12   ALT 0 - 44 U/L 11   9        Micro Results Recent Results (from the past  240 hour(s))  Resp panel by RT-PCR (RSV, Flu A&B, Covid) Anterior Nasal Swab     Status: None   Collection Time: 04/19/22 10:43 PM   Specimen: Anterior Nasal Swab  Result Value Ref Range Status   SARS Coronavirus 2 by RT PCR NEGATIVE NEGATIVE Final    Comment: (NOTE) SARS-CoV-2 target nucleic acids are NOT DETECTED.  The SARS-CoV-2 RNA is generally detectable in upper respiratory specimens during the acute phase of infection. The lowest concentration of SARS-CoV-2 viral copies this assay can detect is 138 copies/mL. A negative result does not preclude  SARS-Cov-2 infection and should not be used as the sole basis for treatment or other patient management decisions. A negative result may occur with  improper specimen collection/handling, submission of specimen other than nasopharyngeal swab, presence of viral mutation(s) within the areas targeted by this assay, and inadequate number of viral copies(<138 copies/mL). A negative result must be combined with clinical observations, patient history, and epidemiological information. The expected result is Negative.  Fact Sheet for Patients:  EntrepreneurPulse.com.au  Fact Sheet for Healthcare Providers:  IncredibleEmployment.be  This test is no t yet approved or cleared by the [place] FDA and  has been authorized for detection and/or diagnosis of SARS-CoV-2 by FDA under an Emergency Use Authorization (EUA). This EUA will remain  in effect (meaning this test can be used) for the duration of the COVID-19 declaration under Section 564(b)(1) of the Act, 21 U.S.C.section 360bbb-3(b)(1), unless the authorization is terminated  or revoked sooner.       Influenza A by PCR NEGATIVE NEGATIVE Final   Influenza B by PCR NEGATIVE NEGATIVE Final    Comment: (NOTE) The Xpert Xpress SARS-CoV-2/FLU/RSV plus assay is intended as an aid in the diagnosis of influenza from Nasopharyngeal swab specimens and should not be used as a sole basis for treatment. Nasal washings and aspirates are unacceptable for Xpert Xpress SARS-CoV-2/FLU/RSV testing.  Fact Sheet for Patients: EntrepreneurPulse.com.au  Fact Sheet for Healthcare Providers: IncredibleEmployment.be  This test is not yet approved or cleared by the [place] FDA and has been authorized for detection and/or diagnosis of SARS-CoV-2 by FDA under an Emergency Use Authorization (EUA). This EUA will remain in effect (meaning this test can be used) for the duration of  the COVID-19 declaration under Section 564(b)(1) of the Act, 21 U.S.C. section 360bbb-3(b)(1), unless the authorization is terminated or revoked.     Resp Syncytial Virus by PCR NEGATIVE NEGATIVE Final    Comment: (NOTE) Fact Sheet for Patients: EntrepreneurPulse.com.au  Fact Sheet for Healthcare Providers: IncredibleEmployment.be  This test is not yet approved or cleared by the [place] FDA and has been authorized for detection and/or diagnosis of SARS-CoV-2 by FDA under an Emergency Use Authorization (EUA). This EUA will remain in effect (meaning this test can be used) for the duration of the COVID-19 declaration under Section 564(b)(1) of the Act, 21 U.S.C. section 360bbb-3(b)(1), unless the authorization is terminated or revoked.  Performed at [facility], [address], [place], [place] [address]   Culture, blood (Routine x 2)     Status: None (Preliminary result)   Collection Time: 04/19/22 10:55 PM   Specimen: BLOOD  Result Value Ref Range Status   Specimen Description BLOOD BLOOD LEFT ARM  Final   Special Requests   Final    BOTTLES DRAWN AEROBIC AND ANAEROBIC Blood Culture adequate volume Performed at [facility], [address] [place],  [address]    Culture PENDING  Incomplete  Report Status PENDING  Incomplete  Culture, blood (Routine x 2)     Status: None (Preliminary result)   Collection Time: 04/19/22 11:16 PM   Specimen: BLOOD  Result Value Ref Range Status   Specimen Description BLOOD BLOOD LEFT ARM  Final   Special Requests   Final    BOTTLES DRAWN AEROBIC AND ANAEROBIC Blood Culture adequate volume Performed at [facility], [address], [place], [place] [address]    Culture PENDING  Incomplete   Report Status PENDING  Incomplete    Radiology Reports DG Chest Port 1 View  Result Date: 04/19/2022 CLINICAL DATA:  Weakness. EXAM: PORTABLE CHEST 1 VIEW COMPARISON:  Chest radiograph dated 11/10/2018  and CT dated 11/10/2018 FINDINGS: The patient is rotated. No focal consolidation, pleural effusion, pneumothorax. Mild cardiomegaly. There is widened appearance of mediastinum, likely related to rotation. Clinical correlation is recommended. No acute osseous pathology. IMPRESSION: 1. No acute cardiopulmonary process. 2. Mild cardiomegaly. 3. Widened appearance of mediastinum, likely related to patient's rotation. Electronically Signed   By: Anner Crete M.D.   On: 04/19/2022 23:16    SIGNED: Deatra James, MD, FHM. FAAFP. [facility]ac[facility] Pontes - [facility] Time spent > 67  min.  Critical care time in seeing, evaluating and examining the patient. Reviewing medical records, labs, drawn plan of care. [facility],  Pager (please use [email or web address] to page/ text) Please use Epic Secure Chat for non-urgent communication (7AM-7PM)  If 7PM-7AM, please contact night-coverage [email or web address], 04/20/2022, 1:07 PM

## 2022-04-20 NOTE — Assessment & Plan Note (Addendum)
-   Hemoglobin 4.3 >>> 3 U PRBC >>> 7.7 now - FOBT negative with brown stool per ED provider report - Patient is anticoagulated for previous PE and DVT - Will continue to hold Pradaxa  - h/o cute on chronic blood loss anemia.  March 2021.  At that time she had Hemoccult positive stools and required a blood transfusion.  She had a EGD and a colonoscopy at that time and was able to be discharged home to restart her Pradaxa  -Also H/o  iron deficiency anemia and is not currently on iron -Status post transfusing 3U PRBC  Iron/TIBC/Ferritin/ %Sat    Component Value Date/Time   IRON 53 04/20/2022 1128   TIBC 378 04/20/2022 1128   FERRITIN 11 04/20/2022 1128   IRONPCTSAT 14 04/20/2022 1128   IRONPCTSAT 4 (L) 06/26/2012 1140

## 2022-04-20 NOTE — Assessment & Plan Note (Signed)
-  Continue Lipitor °

## 2022-04-20 NOTE — Assessment & Plan Note (Signed)
-   ACP documents reviewed, DNR on file - This is confirmed with conversation with patient at bedside

## 2022-04-20 NOTE — Assessment & Plan Note (Signed)
-   Holding Pradaxa due to acute anemia

## 2022-04-20 NOTE — Hospital Course (Signed)
[name] is a 82 y.o. female with medical history significant of iron deficiency anemia, GI bleed, PE on Pradaxa, history of atrial fibrillation, depression, memory loss, hypertension, hyperlipidemia, diabetes mellitus type 2, and more --- presents the ED with a chief complaint of generalized weakness and fell  for several weeks.  It has been progressively worse since it started.   Son at bedside reports that she ambulates with a walker from her chair to the commode.  The rest the time she uses a wheelchair.  She sleeps in a recliner chair at night.  She has been more short of breath with exertion.  Patient reports that she did have chest pain prior to coming to the ER but it resolved spontaneously.  Son notes that patient was wheezing.   She denies any bruising, hematuria, hematemesis, melena, hematochezia, epistaxis, or blood anywhere it should be.  She denies paresthesias.  She reports she does sometimes get dizzy upon standing but that is not new.  Patient does wear 2 L nasal cannula at baseline and that is what she is requiring in the ER. Her blood was a transfusion in March 2021.   On chart review patient had Hemoccult positive stools during that admission.  She was already on Pradaxa at that point as well.  Pradaxa was held and patient was transfused with 2 units.  GI consultation was done because patient had had previously identified duodenal lesions that had been bleeding.  EGD was done, and colonoscopy as well.  Patient was noted to have chronic findings that would be consistent with an acute bleed.  Patient was discharged home and restarted Pradaxa.    Patient's baseline hemoglobin seems to be around 9, but it is really all over the place.  Today it is 4.3.  Son at bedside reports she had been on an iron pill in the past, but is not on one currently and he does not know why it was discontinued.   ED:  Temp 99.1, heart rate 88-92, respiratory rate 17-23, blood pressure 165/69-176/78,  satting 94-98% Borderline leukocytosis at 11.7, hemoglobin extremely low at 4.3, platelets 374 Chemistry shows a creatinine of 1.50 which is at this patient's baseline Lactic acid 1.9 3 units blood transfusion ordered Chest x-ray shows no acute cardiopulmonary disease EKG shows a heart rate of 92, sinus rhythm, QTc 442 FOBT negative with brown stool Admission requested for acute anemia

## 2022-04-20 NOTE — Consult Note (Incomplete)
Palliative Care Consult Note                                  Date: 04/20/2022   Patient Name: [name]  DOB: [birth year]  MRN: [id number]  Age / Sex: 82 y.o., female  PCP: [name], MD Referring Physician: Rolla Plate, DO  Reason for Consultation: {Reason for Consult:23484}  HPI/Patient Profile: 82 y.o. female  with past medical history of *** admitted on 04/19/2022 with ***.   Past Medical History:  Diagnosis Date   Anemia, iron deficiency 2012   Evaluated by Dr. Oneida Alar; H&H of 9.3/30.8 with MCV-79 in 10/2010; 3/3 positive Hemoccult cards in 07/2011   Anxiety    was on Prozac for a couple of weeks   Atrial fibrillation ([place])    takes Coumadin daily   Bruises easily    takes Coumadin   Chronic anticoagulation 07/21/2010   Chronic back pain    related to knee pain   Degenerative joint disease    Knees   Dementia ([place])    Depression    Diabetes mellitus    takes Metformin daily   Gastroesophageal reflux disease    takes Omeprazole daily   Glaucoma    Glaucoma    uses eye drops at night   History of blood transfusion 1969   History of gout    was on medication but taken off of several months ago   Hx of colonic polyps    Hx of migraines    last one about a yr ago;takes Topamax daily   Hyperlipidemia    takes Pravastatin daily   Hypertension    takes Hyzaar daily and Propranolol    Insomnia    takes Restoril prn   Joint pain    Joint swelling    Memory loss    takes donepezil   Migraine    Migraine    Nocturia    Overweight(278.02)    Pneumonia 1969   hx of   Pulmonary embolism [facility]) May 2012   acute presentation, bilateral PE   Skin spots-aging    Urinary frequency     Subjective:   This NP Walden Field reviewed medical records, received report from team, assessed the patient and then meet at the patient's bedside to discuss diagnosis, prognosis, GOC, EOL wishes disposition and  options.  I met with ***.   Concept of Palliative Care was introduced as specialized medical care for people and their families living with serious illness.  If focuses on providing relief from the symptoms and stress of a serious illness.  The goal is to improve quality of life for both the patient and the family. Values and goals of care important to patient and family were attempted to be elicited.  Created space and opportunity for patient  and family to explore thoughts and feelings regarding current medical situation   Natural trajectory and current clinical status were discussed. Questions and concerns addressed. Patient  encouraged to call with questions or concerns.    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)  Depression with anxiety  DNR (do not resuscitate)  Essential hypertension  Hyperlipidemia LDL goal <100  Memory loss  Type 2 diabetes mellitus with neurological complications ([place])  History of pulmonary embolism   Physical Exam  Vital Signs:  BP (!) 173/68   Pulse 87   Temp 97.8 F (36.6 C) (Oral)   Resp 16   Ht '5\' 7"'$  (1.702 m)   Wt 110.2 kg   SpO2 100%   BMI 38.05 kg/m   Palliative Assessment/Data: ***    Advanced Care Planning:   Existing Vynca/ACP Documentation: ***  Primary Decision Maker: {Primary Decision [id number]  Code Status/Advance Care Planning: {Palliative Code status:23503}  A discussion was had today regarding advanced directives. Concepts specific to code status, artifical feeding and hydration, continued IV antibiotics and rehospitalization was had.  The difference between a aggressive medical intervention path and a palliative comfort care path for this patient at this time  was had. ***The MOST form was introduced and discussed.***  Decisions/Changes to ACP: ***  Assessment & Plan:   Impression: ***  SUMMARY OF RECOMMENDATIONS   ***  Symptom Management:  ***  Prognosis:  {Palliative Care Prognosis:23504}  Discharge Planning:  {Palliative dispostion:23505}   Discussed with: ***    Thank you for allowing [place] to participate in the care of ANNANICOLE ENGESSER PMT will continue to support holistically.  Time Total: ***  Greater than 50%  of this time was spent counseling and coordinating care related to the above assessment and plan.  Signed by: Walden Field, NP Palliative Medicine Team  Team Phone # [phone] (Nights/Weekends)  04/20/2022, 3:42 PM

## 2022-04-20 NOTE — Assessment & Plan Note (Signed)
Continue Aricept 

## 2022-04-20 NOTE — Assessment & Plan Note (Addendum)
-   Patient is on 70/30, 55 units at baseline - Reduced to 20 units basal insulin with sliding scale coverage  - Continue to monitor CBGs CBG (last 3)  Recent Labs    04/19/22 2254 04/20/22 0749 04/20/22 1159  GLUCAP 153* 108* 142*

## 2022-04-20 NOTE — H&P (Signed)
History and Physical    Patient: [name] DOB: 15-Sep-1940 DOA: 04/19/2022 DOS: the patient was seen and examined on 04/20/2022 PCP: [name], MD  Patient coming from: Home  Chief Complaint:  Chief Complaint  Patient presents with   Weakness   HPI: [name] is a 82 y.o. female with medical history significant of iron deficiency anemia, GI bleed, PE on Pradaxa, history of atrial fibrillation, depression, memory loss, hypertension, hyperlipidemia, diabetes mellitus type 2, and more presents the ED with a chief complaint of generalized weakness.  Patient reports that she has had generalized weakness for several weeks.  It has been progressively worse since it started.  She reports she fell several weeks ago.  Son at bedside reports that she ambulates with a walker from her chair to the commode.  The rest the time she uses a wheelchair.  She sleeps in a recliner chair at night.  She has been more short of breath with exertion.  Patient reports that she did have chest pain prior to coming to the ER but it resolved spontaneously.  Son notes that patient was wheezing.  Patient reports she uses an inhaler at home and it did help temporarily.  She had a cough but it is not nonproductive.  She denies any fever.  She denies any bruising, hematuria, hematemesis, melena, hematochezia, epistaxis, or blood anywhere it should be.  She denies paresthesias.  She reports she does sometimes get dizzy upon standing but that is not new.  Patient does wear 2 L nasal cannula at baseline and that is what she is requiring in the ER.  Patient has no other complaints at this time.  She reports she knew her blood was low because it felt the same as when she had a transfusion in March 2021.  On chart review patient had Hemoccult positive stools during that admission.  She was already on Pradaxa at that point as well.  Pradaxa was held and patient was transfused with 2 units.  GI consultation was done  because patient had had previously identified duodenal lesions that had been bleeding.  EGD was done, and colonoscopy as well.  Patient was noted to have chronic findings that would be consistent with an acute bleed.  Patient was discharged home and restarted Pradaxa.  Patient's baseline hemoglobin seems to be around 9, but it is really all over the place.  Today it is 4.3.  Son at bedside reports she had been on an iron pill in the past, but is not on one currently and he does not know why it was discontinued. Review of Systems: As mentioned in the history of present illness. All other systems reviewed and are negative. Past Medical History:  Diagnosis Date   Anemia, iron deficiency 2012   Evaluated by Dr. Oneida Alar; H&H of 9.3/30.8 with [place] in 10/2010; 3/3 positive Hemoccult cards in 07/2011   Anxiety    was on Prozac for a couple of weeks   Atrial fibrillation ([place])    takes Coumadin daily   Bruises easily    takes Coumadin   Chronic anticoagulation 07/21/2010   Chronic back pain    related to knee pain   Degenerative joint disease    Knees   Dementia ([place])    Depression    Diabetes mellitus    takes Metformin daily   Gastroesophageal reflux disease    takes Omeprazole daily   Glaucoma    Glaucoma    uses eye drops  at night   History of blood transfusion 1969   History of gout    was on medication but taken off of several months ago   Hx of colonic polyps    Hx of migraines    last one about a yr ago;takes Topamax daily   Hyperlipidemia    takes Pravastatin daily   Hypertension    takes Hyzaar daily and Propranolol    Insomnia    takes Restoril prn   Joint pain    Joint swelling    Memory loss    takes donepezil   Migraine    Migraine    Nocturia    Overweight(278.02)    Pneumonia 1969   hx of   Pulmonary embolism [facility]) May 2012   acute presentation, bilateral PE   Skin spots-aging    Urinary frequency    Past Surgical History:  Procedure Laterality Date   BIOPSY   04/22/2019   Procedure: BIOPSY;  Surgeon: Daneil Dolin, MD;  [facility];  Service: Endoscopy;;  duodenum   CATARACT EXTRACTION W/PHACO Right 04/11/2012   Procedure: CATARACT EXTRACTION PHACO AND INTRAOCULAR LENS PLACEMENT ([place]);  Surgeon: Elta Guadeloupe T. Gershon Crane, MD;  Location: [facility];  Service: Ophthalmology;  Laterality: Right;  CDE=9.61   CATARACT EXTRACTION W/PHACO Left 12/26/2012   Procedure: CATARACT EXTRACTION PHACO AND INTRAOCULAR LENS PLACEMENT ([facility]);  Surgeon: Elta Guadeloupe T. Gershon Crane, MD;  Location: [facility];  Service: Ophthalmology;  Laterality: Left;  CDE:7.74   COLONOSCOPY  Jan 2002; 2012   2002: Dr. Tamala Julian, ext. hemorrhoids, nl colon; 2012-adenomatous polyps, gastritis on EGD   COLONOSCOPY N/A 04/24/2019   Procedure: COLONOSCOPY;  Surgeon: Rogene Houston, MD;  [facility];  Service: Endoscopy;  Laterality: N/A;   CYSTOSCOPY W/ URETERAL STENT PLACEMENT Bilateral 02/25/2018   Procedure: CYSTOSCOPY WITH BILATERAL RETROGRADE PYELOGRAM/BILATERAL URETERAL STENT PLACEMENT, BLADDER BIOPSIES WITH FULGERATION;  Surgeon: [name], MD;  Location: [facility];  Service: Urology;  Laterality: Bilateral;   CYSTOSCOPY/URETEROSCOPY/HOLMIUM LASER/STENT PLACEMENT Bilateral 03/21/2018   Procedure: CYSTOSCOPY/URETEROSCOPY/HOLMIUM LASER/STENT PLACEMENT;  Surgeon: [name], MD;  Location: [facility];  Service: Urology;  Laterality: Bilateral;  ONLY NEEDS 60 MIN   DILATION AND CURETTAGE OF UTERUS     ESOPHAGOGASTRODUODENOSCOPY  08/24/2011   ESOPHAGOGASTRODUODENOSCOPY; esophageal dilatation; Rogene Houston, MD;   ESOPHAGOGASTRODUODENOSCOPY N/A 04/22/2019   Procedure: ESOPHAGOGASTRODUODENOSCOPY (EGD);  Surgeon: Daneil Dolin, MD;  [facility];  Service: Endoscopy;  Laterality: N/A;   GIVENS CAPSULE STUDY  08/18/2011   Procedure: GIVENS CAPSULE STUDY;  Surgeon: Rogene Houston, MD;  [facility];  Service: Endoscopy;  Laterality: N/A;  730   KNEE  ARTHROSCOPY W/ MENISCECTOMY  90's   Left   MALONEY DILATION  04/22/2019   Procedure: MALONEY DILATION;  Surgeon: Daneil Dolin, MD;  [facility];  Service: Endoscopy;;   ORIF ANKLE FRACTURE Right 90's   TONSILLECTOMY     TOTAL KNEE ARTHROPLASTY  10/20/2011   Procedure: TOTAL KNEE ARTHROPLASTY;  Surgeon: Ninetta Lights, MD;  Location: [facility];  Service: Orthopedics;  Laterality: Left;   UPPER GASTROINTESTINAL ENDOSCOPY     URETHRAL DILATION     Social History:  reports that she has never smoked. She has never used smokeless tobacco. She reports that she does not drink alcohol and does not use drugs.  Allergies  Allergen Reactions   Penicillins Itching    Did it involve swelling of the face/tongue/throat, SOB, or low BP?  No Did it involve sudden or severe rash/hives, skin peeling, or any reaction on the inside of your mouth or nose? No Did you need to seek medical attention at a hospital or doctor's office? No When did it last happen?  many years ago    If all above answers are "NO", may proceed with cephalosporin use.     Fentanyl Itching   Sulfonamide Derivatives Hives   Sulfur    Codeine Itching and Rash    tussionex  Is tolerated by patient, no phenergan dm     Family History  Problem Relation Age of Onset   Diabetes Mother    Hypertension Mother    Arthritis Mother    Heart failure Mother    Migraines Mother    Kidney failure Mother    Leukemia Father    Migraines Father    Kidney failure Father    Diabetes Sister    Hypertension Sister    Diabetes Brother    Hypertension Brother    Hypertension Brother    Hypertension Brother    Hypertension Brother    Diabetes Brother    Diabetes Brother    Diabetes Brother    Pulmonary embolism Sister    Migraines Sister    Kidney failure Brother    Colon cancer Neg Hx    Colon polyps Neg Hx     Prior to Admission medications   Medication Sig Start Date End Date Taking? Authorizing Provider  acetaminophen  (TYLENOL) 500 MG tablet Take one tablet two times daily for chronic pain Patient taking differently: Take 500 mg by mouth in the morning and at bedtime. for chronic pain 04/19/19   [name], MD  albuterol (VENTOLIN HFA) 108 (90 Base) MCG/ACT inhaler Inhale 2 puffs into the lungs every 6 (six) hours as needed for wheezing or shortness of breath. 08/14/20   [name], MD  amLODipine (NORVASC) 10 MG tablet Take 1 tablet (10 mg total) by mouth daily. 11/24/21   [name], MD  atorvastatin (LIPITOR) 40 MG tablet TAKE 1 TABLET BY MOUTH ONCE DAILY. 02/12/21   [name], MD  azelastine (OPTIVAR) 0.05 % ophthalmic solution Place 1 drop into the right eye 2 (two) times daily. 03/18/22   [name], MD  BD INSULIN SYRINGE U/F 31G X 5/16" 1 ML MISC USE AS DIRECTED 08/10/21   [name], MD  busPIRone (BUSPAR) 7.5 MG tablet Take 1 tablet (7.5 mg total) by mouth 3 (three) times daily. 03/24/22   [name], MD  cloNIDine (CATAPRES) 0.2 MG tablet TAKE ONE TABLET BY MOUTH AT BEDTIME. 06/18/21   [name], MD  clotrimazole-betamethasone (LOTRISONE) cream Apply 1 application topically 2 (two) times daily. D/c vusion ointment 08/14/20   [name], MD  clotrimazole-betamethasone (LOTRISONE) cream Apply twice daily to affected areas for 1 week , then as needed 03/18/22   [name], MD  COMBIGAN 0.2-0.5 % ophthalmic solution Place 1 drop into both eyes 2 (two) times daily. 03/23/17   [provider]  dabigatran (PRADAXA) 150 MG CAPS capsule TAKE ONE CAPSULE BY MOUTH EVERY TWELVE HOURS 03/15/22   [name], MD  docusate sodium (COLACE) 100 MG capsule Take 1 capsule (100 mg total) by mouth daily as needed for mild constipation. Patient taking differently: Take 100 mg by mouth daily. 03/20/18   Hosie Poisson, MD  donepezil (ARICEPT) 10 MG tablet TAKE ONE TABLET BY MOUTH AT BEDTIME. 01/26/22  [name], MD   ergocalciferol (VITAMIN D2) 1.25 MG (50000 UT) capsule Take 1 capsule (50,000 Units total) by mouth once a week. One capsule once weekly 10/30/21   [name], MD  glucose blood (ACCU-CHEK AVIVA PLUS) test strip Use as instructed three times daily dx e11.65 03/18/22   [name], MD  hydrOXYzine (VISTARIL) 25 MG capsule TAKE (1) CAPSULE BY MOUTH THREE TIMES DAILY 01/14/22   [name], MD  imipramine (TOFRANIL) 50 MG tablet TAKE 2 TABLETS BY MOUTH AT BEDTIME 05/08/21   [name], MD  insulin NPH-regular Human (NOVOLIN 70/30) (70-30) 100 UNIT/ML injection INJECT 55 UNITS INTO THE SKIN TWICE DAILY WITH A MEAL 01/14/22   [name], MD  Insulin Pen Needle (ULTICARE MINI PEN NEEDLES) 31G X 6 MM MISC USE TO INJECT NOVOLIN TWICE DAILY. 01/14/22   [name], MD  Insulin Syringe-Needle U-100 32G X 5/16" 1 ML MISC 1 each by Does not apply route in the morning and at bedtime. Use to inject insulin twice daily dx e11.65 05/16/19   Perlie Mayo, NP  meclizine (ANTIVERT) 25 MG tablet Take 1 tablet (25 mg total) by mouth 3 (three) times daily as needed for dizziness. 11/28/19   [name], MD  montelukast (SINGULAIR) 10 MG tablet Take 1 tablet (10 mg total) by mouth at bedtime. 01/14/22   [name], MD  nystatin (MYCOSTATIN/NYSTOP) powder APPLY TOPCIALLY TO RASH UNDER BREASTS ONCE DAILY AS NEEDED. 06/03/21   [name], MD  olmesartan (BENICAR) 20 MG tablet Take 1 tablet (20 mg total) by mouth daily. 03/22/22   [name], MD  olopatadine (PATANOL) 0.1 % ophthalmic solution Place 1 drop into both eyes 2 (two) times daily. 03/18/22   [name], MD  omeprazole (PRILOSEC) 40 MG capsule TAKE ONE CAPSULE BY MOUTH TWICE DAILY 03/01/22   [name], MD  oxyCODONE-acetaminophen [facility]) 10-325 MG tablet Take one tablet by moth two times daily for pain 07/19/21   [name], MD  oxyCODONE-acetaminophen  (PERCOCET) 10-325 MG tablet Take one tablet by mouth two times daily for generalized pain 11/20/21   [name], MD  oxyCODONE-acetaminophen [facility]) 10-325 MG tablet Take one tablet by mouth twice daily for chronic pain 03/24/22 04/23/22  [name], MD  oxyCODONE-acetaminophen (PERCOCET) 10-325 MG tablet Take one tablet by mouth two times daily for chronic pain 04/23/22 05/23/22  [name], MD  oxyCODONE-acetaminophen (PERCOCET) 10-325 MG tablet Take one tablet by mouth two times daily for chronic pain 05/23/22   [name], MD  SUMAtriptan (IMITREX) 25 MG tablet TAKE 1 TABLET BY MOUTH AS NEEDED FOR MIGRAINE. MAY REPEAT IN 2 HOURS IF HEADACHE PERSISTS OR RECURS. 06/04/20   [name], MD  topiramate (TOPAMAX) 100 MG tablet TAKE 1 TABLET BY MOUTH TWICE DAILY 02/16/22   [name], MD  UNABLE TO FIND Standard wheelchair with elevating leg rests DX M25.561, M54.41 03/12/20   [name], MD  UNABLE TO FIND Skilled Nursing to eval and treat as indicated. Will need routine lab work and vital sign assessments. 08/14/20   [name], MD  UNABLE TO [place] to evaluate and treat as indicated. Dx: Z74.2 08/27/20   [name], MD  UNABLE TO FIND Med Name: toxassure 8 dx: PAIN MANAGEMENT CONTRACT Z08.89 09/10/21   [name], MD    Physical Exam: Vitals:   04/20/22  0330 04/20/22 0345 04/20/22 0400 04/20/22 0415  BP: (!) 173/97  (!) 183/86   Pulse: 85  87 91  Resp: (!) 21  (!) 21 (!) 24  Temp:  98.4 F (36.9 C) 98.8 F (37.1 C)   TempSrc:  Oral Oral   SpO2: 97%  100% 98%  Weight:      Height:       1.  General: Patient lying supine in bed,  no acute distress   2. Psychiatric: Alert and oriented x 3, mood and behavior normal for situation, pleasant and cooperative with exam   3. Neurologic: Speech was with a list and language is normal, face is symmetric, moves all 4 extremities voluntarily, at baseline without  acute deficits on limited exam   4. HEENMT:  Head is atraumatic, normocephalic, pupils reactive to light, neck is supple, trachea is midline, mucous membranes are moist   5. Respiratory : Lungs are clear to auscultation bilaterally without wheezing, rhonchi, rales, no cyanosis, no increase in work of breathing or accessory muscle use   6. Cardiovascular : Heart rate normal, rhythm is regular, no rubs or gallops, no peripheral edema, peripheral pulses palpated   7. Gastrointestinal:  Abdomen is soft, nondistended, nontender to palpation bowel sounds active, no masses or organomegaly palpated   8. Skin:  Chronic venous stasis changes of the bilateral lower extremities, with evidence of recent volume changes with wrinkling of the skin   9.Musculoskeletal:  No acute deformities or trauma, no asymmetry in tone, no peripheral edema, peripheral pulses palpated, tenderness to palpation of the bilateral lower extremities over the area with venous stasis changes, but not in the calves  Data Reviewed: In the ED Temp 99.1, heart rate 88-92, respiratory rate 17-23, blood pressure 165/69-176/78, satting 94-98% Borderline leukocytosis at 11.7, hemoglobin extremely low at 4.3, platelets 374 Chemistry shows a creatinine of 1.50 which is at this patient's baseline Lactic acid 1.9 3 units blood transfusion ordered Chest x-ray shows no acute cardiopulmonary disease EKG shows a heart rate of 92, sinus rhythm, QTc 442 FOBT negative with brown stool Admission requested for acute anemia  Assessment and Plan: * Acute anemia - Hemoglobin 4.3 - FOBT negative with brown stool per ED provider report - Patient is anticoagulated for previous PE and DVT - Hold Pradaxa - Patient does have a history of acute on chronic blood loss anemia.  March 2021.  At that time she had Hemoccult positive stools and required a blood transfusion.  She had a EGD and a colonoscopy at that time and was able to be discharged home  to restart her Pradaxa - Patient also has a history of iron deficiency anemia and is not currently on iron - Anemia panel pending - 3 units blood transfusing - Recheck CBC with a.m. labs  DNR (do not resuscitate) - ACP documents reviewed, DNR on file - This is confirmed with conversation with patient at bedside  CKD stage G3b/A1, 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) - Creatinine is at baseline 1.50 - Continue to monitor  History of pulmonary embolism - Holding Pradaxa due to acute anemia  Type 2 diabetes mellitus with neurological complications ([place]) - Patient is on 70/30, 55 units at baseline - Reduced to 20 units basal insulin with sliding scale coverage - Glucose well-controlled in the ED at 154 - Continue to monitor CBGs  Depression with anxiety - Continue BuSpar and imipramine  Memory loss - Continue Aricept  Essential hypertension - Continue Norvasc, clonidine,  ARB  Hyperlipidemia LDL goal <100 - Continue Lipitor      Advance Care Planning:   Code Status: DNR  Consults: May need heme for GI consult, but awaiting anemia panel  Family Communication: Son at bedside  Severity of Illness: The appropriate patient status for this patient is INPATIENT. Inpatient status is judged to be reasonable and necessary in order to provide the required intensity of service to ensure the patient's safety. The patient's presenting symptoms, physical exam findings, and initial radiographic and laboratory data in the context of their chronic comorbidities is felt to place them at high risk for further clinical deterioration. Furthermore, it is not anticipated that the patient will be medically stable for discharge from the hospital within 2 midnights of admission.   * I certify that at the point of admission it is my clinical judgment that the patient will require inpatient hospital care spanning beyond 2 midnights from the point of admission due to high intensity of  service, high risk for further deterioration and high frequency of surveillance required.*  Author: Rolla Plate, DO 04/20/2022 4:56 AM  For on call review [email or web address].

## 2022-04-20 NOTE — Assessment & Plan Note (Signed)
-   Continue BuSpar and imipramine

## 2022-04-20 NOTE — ED Notes (Signed)
Pt had x2 large BMs on bedpan, pt cleaned with soap and water, pt did not tolerate being moved in the bed well, pt states, "you are too rough" pt very limited assist while being cleaned, pts purewick replaced, pt family and son updated on plan of care

## 2022-04-20 NOTE — TOC Progression Note (Signed)
  Transition of Care [facility]) Screening Note   Patient Details  Name: [name] Date of Birth: 13-Mar-1940   Transition of Care [facility]) CM/SW Contact:    Boneta Lucks, RN Phone Number: 04/20/2022, 11:35 AM  From home on 2L home oxygen, PT eval pending.   Transition of Care Department [facility]) has reviewed patient and no TOC needs have been identified at this time. We will continue to monitor patient advancement through interdisciplinary progression rounds. If new patient transition needs arise, please place a TOC consult.    Expected Discharge Plan: Home/Self Care Barriers to Discharge: Continued Medical Work up

## 2022-04-20 NOTE — Assessment & Plan Note (Addendum)
-   Creatinine is at baseline 1.50 - Continue to monitor Lab Results  Component Value Date   CREATININE 1.31 (H) 04/20/2022   CREATININE 1.50 (H) 04/19/2022   CREATININE 1.58 (H) 03/18/2022

## 2022-04-20 NOTE — Assessment & Plan Note (Signed)
-   Continue Norvasc, clonidine, ARB

## 2022-04-20 NOTE — Consult Note (Addendum)
$'@LOGO'j$ @   Referring Provider: [facility]  Primary Care Physician:  [name], MD Primary Gastroenterologist:  Dr. Laural Golden previously, will establish with Dr. Jenetta Downer.   Date of Admission: 04/19/22 Date of Consultation: 04/20/22  Reason for Consultation:  Acute Symptomatic anemia  HPI:  [name] is a 82 y.o. year old female with history of atrial fibrillation, pulmonary embolism, chronically anticoagulated on Pradaxa, type 2 diabetes, HTN, HLD, renal insufficiency, GERD, colon polyps, iron deficiency anemia, who presented to the emergency room late evening of 3/4 with complaints of generalized weakness that has been worsening over the last 2 weeks.  She was found to have a hemoglobin of 4.3 in the emergency room, heme positive on rectal exam.  She remained hemodynamically stable in the emergency room.  3 units PRBCs were ordered.  She was admitted with acute symptomatic anemia and GI consulted for further evaluation.  Today, hemoglobin improved to 7.7.  B12 low normal at 224.  Folate normal.  Iron low normal with ferritin 11, iron 53, saturation 14%.  Consult:  Weakness and no appetite for the last 1.5 weeks. Noted shortness of breath as well. Some nausea but no vomiting. Nausea has been present for a month or so. Occasional reflux.  Takes omeprazole outpatient.  Dysphagia recently to solid foods. No regurgitation. Items pass with drinking water. No brbpr, melena. Son reports stools are dark brown chronically. Bowels moving normally. Used to be on iron. But hasn't been in quite some time.   No hematuria or vaginal bleeding.   SOB improved with PRBCs. Chronically on 2L [place].   NSAIDs: None.   Never followed with anyone for hematology.   Last dose of Pradaxa last night.   Last colonoscopy March 2021 with medium sized lipoma in the mid transverse colon, diverticulosis in sigmoid colon, external hemorrhoids.  Last EGD March 2021 with normal esophagus dilated, small hiatal  hernia, duodenal polypoid nodules biopsied.  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  Recommended outpatient capsule endoscopy in 2021, but this was never completed.  Prior Givens capsule in July 2013 with fresh blood noted post bulbar duodenum.  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  Past Medical History:  Diagnosis Date   Anemia, iron deficiency 2012   Evaluated by Dr. Oneida Alar; H&H of 9.3/30.8 with MCV-79 in 10/2010; 3/3 positive Hemoccult cards in 07/2011   Anxiety    was on Prozac for a couple of weeks   Atrial fibrillation ([place])    takes Coumadin daily   Bruises easily    takes Coumadin   Chronic anticoagulation 07/21/2010   Chronic back pain    related to knee pain   Degenerative joint disease    Knees   Dementia ([place])    Depression    Diabetes mellitus    takes Metformin daily   Gastroesophageal reflux disease    takes Omeprazole daily   Glaucoma    Glaucoma    uses eye drops at night   History of blood transfusion 1969   History of gout    was on medication but taken off of several months ago   Hx of colonic polyps    Hx of migraines    last one about a yr ago;takes Topamax daily   Hyperlipidemia    takes Pravastatin daily   Hypertension    takes Hyzaar daily and Propranolol    Insomnia    takes Restoril prn  Joint pain    Joint swelling    Memory loss    takes donepezil   Migraine    Migraine    Nocturia    Overweight(278.02)    Pneumonia 1969   hx of   Pulmonary embolism [facility]) May 2012   acute presentation, bilateral PE   Skin spots-aging    Urinary frequency     Past Surgical History:  Procedure Laterality Date   BIOPSY  04/22/2019   Procedure: BIOPSY;  Surgeon: Daneil Dolin, MD;  [facility];  Service: Endoscopy;;  duodenum   CATARACT EXTRACTION W/PHACO Right 04/11/2012   Procedure: CATARACT EXTRACTION PHACO AND INTRAOCULAR  LENS PLACEMENT ([place]);  Surgeon: Elta Guadeloupe T. Gershon Crane, MD;  Location: [facility];  Service: Ophthalmology;  Laterality: Right;  CDE=9.61   CATARACT EXTRACTION W/PHACO Left 12/26/2012   Procedure: CATARACT EXTRACTION PHACO AND INTRAOCULAR LENS PLACEMENT ([facility]);  Surgeon: Elta Guadeloupe T. Gershon Crane, MD;  Location: [facility];  Service: Ophthalmology;  Laterality: Left;  CDE:7.74   COLONOSCOPY  Jan 2002; 2012   2002: Dr. Tamala Julian, ext. hemorrhoids, nl colon; 2012-adenomatous polyps, gastritis on EGD   COLONOSCOPY N/A 04/24/2019   Procedure: COLONOSCOPY;  Surgeon: Rogene Houston, MD;  [facility];  Service: Endoscopy;  Laterality: N/A;   CYSTOSCOPY W/ URETERAL STENT PLACEMENT Bilateral 02/25/2018   Procedure: CYSTOSCOPY WITH BILATERAL RETROGRADE PYELOGRAM/BILATERAL URETERAL STENT PLACEMENT, BLADDER BIOPSIES WITH FULGERATION;  Surgeon: [name], MD;  Location: [facility];  Service: Urology;  Laterality: Bilateral;   CYSTOSCOPY/URETEROSCOPY/HOLMIUM LASER/STENT PLACEMENT Bilateral 03/21/2018   Procedure: CYSTOSCOPY/URETEROSCOPY/HOLMIUM LASER/STENT PLACEMENT;  Surgeon: [name], MD;  Location: [facility];  Service: Urology;  Laterality: Bilateral;  ONLY NEEDS 60 MIN   DILATION AND CURETTAGE OF UTERUS     ESOPHAGOGASTRODUODENOSCOPY  08/24/2011   ESOPHAGOGASTRODUODENOSCOPY; esophageal dilatation; Rogene Houston, MD;   ESOPHAGOGASTRODUODENOSCOPY N/A 04/22/2019   Procedure: ESOPHAGOGASTRODUODENOSCOPY (EGD);  Surgeon: Daneil Dolin, MD;  [facility];  Service: Endoscopy;  Laterality: N/A;   GIVENS CAPSULE STUDY  08/18/2011   Procedure: GIVENS CAPSULE STUDY;  Surgeon: Rogene Houston, MD;  [facility];  Service: Endoscopy;  Laterality: N/A;  730   KNEE ARTHROSCOPY W/ MENISCECTOMY  90's   Left   MALONEY DILATION  04/22/2019   Procedure: MALONEY DILATION;  Surgeon: Daneil Dolin, MD;  [facility];  Service: Endoscopy;;   ORIF ANKLE FRACTURE Right 90's   TONSILLECTOMY      TOTAL KNEE ARTHROPLASTY  10/20/2011   Procedure: TOTAL KNEE ARTHROPLASTY;  Surgeon: Ninetta Lights, MD;  Location: [facility];  Service: Orthopedics;  Laterality: Left;   UPPER GASTROINTESTINAL ENDOSCOPY     URETHRAL DILATION      Prior to Admission medications   Medication Sig Start Date End Date Taking? Authorizing Provider  acetaminophen (TYLENOL) 500 MG tablet Take one tablet two times daily for chronic pain Patient taking differently: Take 500 mg by mouth in the morning and at bedtime. for chronic pain 04/19/19  Yes [name], MD  albuterol (VENTOLIN HFA) 108 (90 Base) MCG/ACT inhaler Inhale 2 puffs into the lungs every 6 (six) hours as needed for wheezing or shortness of breath. 08/14/20  Yes [name], MD  amLODipine (NORVASC) 10 MG tablet Take 1 tablet (10 mg total) by mouth daily. 11/24/21  Yes [name], MD  atorvastatin (LIPITOR) 40 MG tablet TAKE 1 TABLET BY MOUTH ONCE DAILY. 02/12/21  Yes [name], MD  azelastine (OPTIVAR) 0.05 % ophthalmic solution Place 1 drop into the right eye 2 (two) times daily. 03/18/22  Yes [name], MD  busPIRone (BUSPAR) 7.5 MG tablet Take 1 tablet (7.5 mg total) by mouth 3 (three) times daily. 03/24/22  Yes [name], MD  cloNIDine (CATAPRES) 0.2 MG tablet TAKE ONE TABLET BY MOUTH AT BEDTIME. 06/18/21  Yes [name], MD  dabigatran (PRADAXA) 150 MG CAPS capsule TAKE ONE CAPSULE BY MOUTH EVERY TWELVE HOURS 03/15/22  Yes [name], MD  docusate sodium (COLACE) 100 MG capsule Take 1 capsule (100 mg total) by mouth daily as needed for mild constipation. Patient taking differently: Take 100 mg by mouth daily. 03/20/18  Yes Hosie Poisson, MD  donepezil (ARICEPT) 10 MG tablet TAKE ONE TABLET BY MOUTH AT BEDTIME. 01/26/22  Yes [name], MD  ergocalciferol (VITAMIN D2) 1.25 MG (50000 UT) capsule Take 1 capsule (50,000 Units total) by mouth once a week. One capsule once weekly 10/30/21   Yes [name], MD  hydrOXYzine (VISTARIL) 25 MG capsule TAKE (1) CAPSULE BY MOUTH THREE TIMES DAILY 01/14/22  Yes [name], MD  imipramine (TOFRANIL) 50 MG tablet TAKE 2 TABLETS BY MOUTH AT BEDTIME 05/08/21  Yes [name], MD  insulin NPH-regular Human (NOVOLIN 70/30) (70-30) 100 UNIT/ML injection INJECT 55 UNITS INTO THE SKIN TWICE DAILY WITH A MEAL 01/14/22  Yes [name], MD  montelukast (SINGULAIR) 10 MG tablet Take 1 tablet (10 mg total) by mouth at bedtime. 01/14/22  Yes [name], MD  nystatin (MYCOSTATIN/NYSTOP) powder APPLY TOPCIALLY TO RASH UNDER BREASTS ONCE DAILY AS NEEDED. 06/03/21  Yes [name], MD  olmesartan (BENICAR) 20 MG tablet Take 1 tablet (20 mg total) by mouth daily. 03/22/22  Yes [name], MD  olopatadine (PATANOL) 0.1 % ophthalmic solution Place 1 drop into both eyes 2 (two) times daily. 03/18/22  Yes [name], MD  omeprazole (PRILOSEC) 40 MG capsule TAKE ONE CAPSULE BY MOUTH TWICE DAILY 03/01/22  Yes [name], MD  oxyCODONE-acetaminophen (PERCOCET) 10-325 MG tablet Take one tablet by mouth two times daily for chronic pain 05/23/22  Yes [name], MD  SUMAtriptan (IMITREX) 25 MG tablet TAKE 1 TABLET BY MOUTH AS NEEDED FOR MIGRAINE. MAY REPEAT IN 2 HOURS IF HEADACHE PERSISTS OR RECURS. 06/04/20  Yes [name], MD  topiramate (TOPAMAX) 100 MG tablet TAKE 1 TABLET BY MOUTH TWICE DAILY 02/16/22  Yes [name], MD  BD INSULIN SYRINGE U/F 31G X 5/16" 1 ML MISC USE AS DIRECTED 08/10/21   [name], MD  clotrimazole-betamethasone (LOTRISONE) cream Apply 1 application topically 2 (two) times daily. D/c vusion ointment 08/14/20   [name], MD  clotrimazole-betamethasone (LOTRISONE) cream Apply twice daily to affected areas for 1 week , then as needed Patient not taking: Reported on 04/20/2022 03/18/22   [name], MD  COMBIGAN 0.2-0.5 % ophthalmic  solution Place 1 drop into both eyes 2 (two) times daily. Patient not taking: Reported on 04/20/2022 03/23/17   [provider]  glucose blood (ACCU-CHEK AVIVA PLUS) test strip Use as instructed three times daily dx e11.65 03/18/22   [name], MD  Insulin Pen Needle (ULTICARE MINI PEN NEEDLES) 31G X 6 MM MISC USE TO INJECT NOVOLIN TWICE DAILY. 01/14/22   [name], MD  Insulin Syringe-Needle U-100 32G X 5/16" 1 ML MISC 1 each by Does not  apply route in the morning and at bedtime. Use to inject insulin twice daily dx e11.65 05/16/19   Perlie Mayo, NP  meclizine (ANTIVERT) 25 MG tablet Take 1 tablet (25 mg total) by mouth 3 (three) times daily as needed for dizziness. Patient not taking: Reported on 04/20/2022 11/28/19   [name], MD  oxyCODONE-acetaminophen [facility]) 10-325 MG tablet Take one tablet by moth two times daily for pain Patient not taking: Reported on 04/20/2022 07/19/21   [name], MD  oxyCODONE-acetaminophen (PERCOCET) 10-325 MG tablet Take one tablet by mouth two times daily for generalized pain Patient not taking: Reported on 04/20/2022 11/20/21   [name], MD  oxyCODONE-acetaminophen (PERCOCET) 10-325 MG tablet Take one tablet by mouth twice daily for chronic pain Patient not taking: Reported on 04/20/2022 03/24/22 04/23/22  [name], MD  oxyCODONE-acetaminophen (PERCOCET) 10-325 MG tablet Take one tablet by mouth two times daily for chronic pain Patient not taking: Reported on 04/20/2022 04/23/22 05/23/22  [name], MD  UNABLE TO FIND Standard wheelchair with elevating leg rests DX M25.561, M54.41 03/12/20   [name], MD  UNABLE TO FIND Skilled Nursing to eval and treat as indicated. Will need routine lab work and vital sign assessments. 08/14/20   [name], MD  UNABLE TO [place] to evaluate and treat as indicated. Dx: Z74.2 08/27/20   [name], MD  UNABLE TO FIND Med Name:  toxassure 8 dx: PAIN MANAGEMENT CONTRACT Z08.89 09/10/21   [name], MD    Current Facility-Administered Medications  Medication Dose Route Frequency Provider Last Rate Last Admin   0.9 %  sodium chloride infusion (Manually program via Guardrails IV Fluids)   Intravenous Once Zierle-Ghosh, Asia B, DO       0.9 %  sodium chloride infusion (Manually program via Guardrails IV Fluids)   Intravenous Once Shahmehdi, Seyed A, MD       acetaminophen (TYLENOL) tablet 650 mg  650 mg Oral Q6H PRN Zierle-Ghosh, Asia B, DO       Or   acetaminophen (TYLENOL) suppository 650 mg  650 mg Rectal Q6H PRN Zierle-Ghosh, Asia B, DO       [START ON 04/21/2022] amLODipine (NORVASC) tablet 5 mg  5 mg Oral Daily Shahmehdi, Seyed A, MD       atorvastatin (LIPITOR) tablet 40 mg  40 mg Oral Daily Zierle-Ghosh, Asia B, DO   40 mg at 04/20/22 1113   busPIRone (BUSPAR) tablet 7.5 mg  7.5 mg Oral TID Zierle-Ghosh, Asia B, DO   7.5 mg at 04/20/22 1111   Chlorhexidine Gluconate Cloth 2 % PADS 6 each  6 each Topical Daily Zierle-Ghosh, Asia B, DO   6 each at 04/20/22 1152   donepezil (ARICEPT) tablet 10 mg  10 mg Oral QHS Zierle-Ghosh, Asia B, DO       hydrOXYzine (ATARAX) tablet 25 mg  25 mg Oral TID PRN Shahmehdi, Seyed A, MD       imipramine (TOFRANIL) tablet 100 mg  100 mg Oral QHS Shahmehdi, Seyed A, MD       insulin aspart (novoLOG) injection 0-15 Units  0-15 Units Subcutaneous TID WC Zierle-Ghosh, Asia B, DO   3 Units at 04/20/22 1224   insulin aspart (novoLOG) injection 0-5 Units  0-5 Units Subcutaneous QHS Zierle-Ghosh, Asia B, DO       insulin detemir (LEVEMIR) injection 20 Units  20 Units Subcutaneous QHS Zierle-Ghosh, Asia B, DO  irbesartan (AVAPRO) tablet 150 mg  150 mg Oral Daily Zierle-Ghosh, Asia B, DO   150 mg at 04/20/22 1112   morphine (PF) 2 MG/ML injection 2 mg  2 mg Intravenous Q2H PRN Zierle-Ghosh, Asia B, DO       ondansetron (ZOFRAN) tablet 4 mg  4 mg Oral Q6H PRN Zierle-Ghosh, Asia B, DO        Or   ondansetron (ZOFRAN) injection 4 mg  4 mg Intravenous Q6H PRN Zierle-Ghosh, Asia B, DO       oxyCODONE (Oxy IR/ROXICODONE) immediate release tablet 5 mg  5 mg Oral Q4H PRN Zierle-Ghosh, Asia B, DO       pantoprazole (PROTONIX) injection 40 mg  40 mg Intravenous Q12H Zierle-Ghosh, Asia B, DO   40 mg at 04/20/22 1113   Facility-Administered Medications Ordered in Other Encounters  Medication Dose Route Frequency Provider Last Rate Last Admin   fentaNYL (SUBLIMAZE) injection 25-50 mcg  25-50 mcg Intravenous Q5 min PRN Lerry Liner, MD        Allergies as of 04/19/2022 - Review Complete 04/19/2022  Allergen Reaction Noted   Penicillins Itching 11/16/2013   Fentanyl Itching 08/09/2011   Sulfonamide derivatives Hives 02/03/2007   Sulfur  11/12/2019   Codeine Itching and Rash     Family History  Problem Relation Age of Onset   Diabetes Mother    Hypertension Mother    Arthritis Mother    Heart failure Mother    Migraines Mother    Kidney failure Mother    Leukemia Father    Migraines Father    Kidney failure Father    Diabetes Sister    Hypertension Sister    Diabetes Brother    Hypertension Brother    Hypertension Brother    Hypertension Brother    Hypertension Brother    Diabetes Brother    Diabetes Brother    Diabetes Brother    Pulmonary embolism Sister    Migraines Sister    Kidney failure Brother    Colon cancer Neg Hx    Colon polyps Neg Hx     Social History   Socioeconomic History   Marital status: Widowed    Spouse name: Not on file   Number of children: 2   Years of education: 31   Highest education level: Not on file  Occupational History   Occupation: retired     Comment: Licensed conveyancer Surveyor, quantity)    Employer: RETIRED  Tobacco Use   Smoking status: Never   Smokeless tobacco: Never  Vaping Use   Vaping Use: Never used  Substance and Sexual Activity   Alcohol use: No   Drug use: No   Sexual activity: Not Currently    Birth  control/protection: Post-menopausal  Other Topics Concern   Not on file  Social History Narrative   Lives with son.   Right-handed.   1 cup caffeine per day.   Social Determinants of Health   Financial Resource Strain: Low Risk  (09/11/2021)   Overall Financial Resource Strain (CARDIA)    Difficulty of Paying Living Expenses: Not hard at all  Food Insecurity: No Food Insecurity (04/20/2022)   Hunger Vital Sign    Worried About Running Out of Food in the Last Year: Never true    Ran Out of Food in the Last Year: Never true  Transportation Needs: No Transportation Needs (04/20/2022)   PRAPARE - Hydrologist (Medical): No    Lack of Transportation (Non-Medical):  No  Physical Activity: Inactive (09/11/2021)   Exercise Vital Sign    Days of Exercise per Week: 0 days    Minutes of Exercise per Session: 0 min  Stress: No Stress Concern Present (09/11/2021)   [place]    Feeling of Stress : Not at all  Social Connections: Socially Isolated (09/11/2021)   Social Connection and Isolation Panel [NHANES]    Frequency of Communication with Friends and Family: More than three times a week    Frequency of Social Gatherings with Friends and Family: More than three times a week    Attends Religious Services: Never    Marine scientist or Organizations: No    Attends Archivist Meetings: Never    Marital Status: Widowed  Intimate Partner Violence: Not At Risk (04/20/2022)   Humiliation, Afraid, Rape, and Kick questionnaire    Fear of Current or Ex-Partner: No    Emotionally Abused: No    Physically Abused: No    Sexually Abused: No    Review of Systems: Gen: Denies fever, chills, cold or flu like symptoms, pre-syncope, or syncope.  CV: Denies chest pain, heart palpitations. Resp: No SOB or cough. Admits to wheezing.  GI: See HPI GU : See HPI MS: Denies joint pain. Derm: Denies  rash. Psych: Denies depression, anxiety. Heme: See HPI  Physical Exam: Vital signs in last 24 hours: Temp:  [97.8 F (36.6 C)-99.1 F (37.3 C)] 97.8 F (36.6 C) (03/05 1415) Pulse Rate:  [82-96] 87 (03/05 1400) Resp:  [15-27] 16 (03/05 1400) BP: (108-183)/(54-97) 173/68 (03/05 1400) SpO2:  [93 %-100 %] 100 % (03/05 1400) Weight:  [109.8 kg-110.2 kg] 110.2 kg (03/05 1110) Last BM Date : 04/20/22 General:   Alert,  Well-developed, well-nourished, no acute distress. Head:  Normocephalic and atraumatic. Eyes:  Sclera clear, no icterus.   Conjunctiva pink. Ears:  Normal auditory acuity. Nose:  No deformity, discharge,  or lesions. Lungs: Bilateral diffuse expiratory wheeze.    Heart:  Regular rate and rhythm; no murmurs, clicks, rubs,  or gallops. Abdomen:  Soft, nontender and nondistended. No masses, hepatosplenomegaly or hernias noted. Normal bowel sounds, without guarding, and without rebound.   Rectal:  Deferred  Msk:  Symmetrical without gross deformities. Normal posture. Extremities:  With 1+ edema. Neurologic:  Alert and  oriented x4;  grossly normal neurologically. Skin:  Intact without significant lesions or rashes. Psych:  Normal mood and affect.  Intake/Output from previous day: 03/04 0701 - 03/05 0700 In: 370 [Blood:370] Out: -  Intake/Output this shift: Total I/O In: 720 [P.O.:240; Blood:480] Out: 800 [Urine:800]  Lab Results: Recent Labs    04/19/22 2255 04/20/22 1128  WBC 11.7* 11.9*  HGB 4.3* 7.7*  HCT 15.2* 24.4*  PLT 374 350   BMET Recent Labs    04/19/22 2255 04/20/22 1128  NA 137 138  K 4.0 4.1  CL 104 103  CO2 24 22  GLUCOSE 154* 167*  BUN 24* 19  CREATININE 1.50* 1.31*  CALCIUM 8.4* 8.3*   LFT Recent Labs    04/20/22 1128  PROT 7.1  ALBUMIN 3.2*  AST 15  ALT 11  ALKPHOS 99  BILITOT 0.1*   PT/INR Recent Labs    04/19/22 2255  LABPROT 16.5*  INR 1.3*   Studies/Results: DG Chest Port 1 View  Result Date:  04/19/2022 CLINICAL DATA:  Weakness. EXAM: PORTABLE CHEST 1 VIEW COMPARISON:  Chest radiograph dated 11/10/2018 and CT dated  11/10/2018 FINDINGS: The patient is rotated. No focal consolidation, pleural effusion, pneumothorax. Mild cardiomegaly. There is widened appearance of mediastinum, likely related to rotation. Clinical correlation is recommended. No acute osseous pathology. IMPRESSION: 1. No acute cardiopulmonary process. 2. Mild cardiomegaly. 3. Widened appearance of mediastinum, likely related to patient's rotation. Electronically Signed   By: Anner Crete M.D.   On: 04/19/2022 23:16    Impression:  82 y.o. year old female with history of atrial fibrillation, pulmonary embolism, chronically anticoagulated on Pradaxa, type 2 diabetes, HTN, HLD, renal insufficiency, GERD, colon polyps, iron deficiency anemia, who presented to the emergency room late evening of 3/4 with complaints of generalized weakness, now admitted with acute symptomatic anemia.  Acute symptomatic anemia: Hemoglobin 4.3 on admission, heme positive on rectal exam.  Last hemoglobin on file was 13.2 in November 2021.  Now s/p 3 units PRBCs with hemoglobin improved to 7.7 and receiving her 4th unit currently. Labs reveal low normal B12 and low normal iron. Denies overt GI bleeding or NSAID use. She does report nausea x 1 + month, lack of appetite, and solid food dysphagia. She does have history of GI bleeding from dieulafoy lesion in second portion of small bowel s/p APC in 2013.  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  She was to complete a Givens capsule in 2021, but this was never completed.   Suspect blood loss anemia from unknown source as primary etiology of anemia.  Could essentially be  bleeding from anywhere in her GI tract in the setting of anticoagulation.  However, with no overt bleeding and history of dieulafoy lesion in the duodenum, recommend starting with EGD for further evaluation. This can be completed Thursday as her last dose of dabigatran was evening of 3/4.   Dysphagia:  New onset solid food dysphagia.  She does have history of GERD but reports this is fairly well-controlled on omeprazole daily.  May have esophageal web, ring, stricture, but unable to rule out malignancy.  This will be evaluated at the time of EGD.  Can add possible dilation as appropriate.  Plan: IV PPI BID Monitor H/H and transfuse as necessary.  Monitor for overt GI bleeding. Plan for EGD with possible esophageal dilation with propofol with Dr. Jenetta Downer on Thursday. The risks, benefits, and alternatives have been discussed with the patient and son in detail. All parties state understanding and desire to proceed.  Would benefit from iron and B12 supplementation.    LOS: 0 days    04/20/2022, 2:43 PM   Aliene Altes, [facility]

## 2022-04-21 DIAGNOSIS — D649 Anemia, unspecified: Secondary | ICD-10-CM

## 2022-04-21 LAB — GLUCOSE, CAPILLARY
Glucose-Capillary: 167 mg/dL — ABNORMAL HIGH (ref 70–99)
Glucose-Capillary: 182 mg/dL — ABNORMAL HIGH (ref 70–99)
Glucose-Capillary: 158 mg/dL — ABNORMAL HIGH (ref 70–99)
Glucose-Capillary: 184 mg/dL — ABNORMAL HIGH (ref 70–99)

## 2022-04-21 LAB — BLOOD CULTURE ID PANEL (REFLEXED) - BCID2

## 2022-04-21 LAB — TYPE AND SCREEN
ABO/RH(D): AB POS
Antibody Screen: NEGATIVE
Unit division: 0
Unit division: 0
Unit division: 0
Unit division: 0

## 2022-04-21 LAB — BPAM RBC
Blood Product Expiration Date: 202403182359
Blood Product Expiration Date: 202403192359
Blood Product Expiration Date: 202403272359
Blood Product Expiration Date: 202404052359
ISSUE DATE / TIME: 202403050032
ISSUE DATE / TIME: 202403050351
ISSUE DATE / TIME: 202403050733
ISSUE DATE / TIME: 202403051346
Unit Type and Rh: 600
Unit Type and Rh: 600
Unit Type and Rh: 6200
Unit Type and Rh: 6200

## 2022-04-21 LAB — HEMOGLOBIN AND HEMATOCRIT, BLOOD
HCT: 33 % — ABNORMAL LOW (ref 36.0–46.0)
Hemoglobin: 10.6 g/dL — ABNORMAL LOW (ref 12.0–15.0)

## 2022-04-21 MED ORDER — VANCOMYCIN HCL 750 MG/150ML IV SOLN: 750.0000 mg | INTRAVENOUS | Status: DC

## 2022-04-21 MED ORDER — CLONIDINE HCL 0.2 MG PO TABS
0.2000 mg | ORAL_TABLET | Freq: Two times a day (BID) | ORAL | Status: DC
  Administered 2022-04-21: 0.2 mg via ORAL
  Filled 2022-04-21: qty 1

## 2022-04-21 MED ORDER — VANCOMYCIN HCL 2000 MG/400ML IV SOLN
2000.0000 mg | Freq: Once | INTRAVENOUS | Status: AC
  Administered 2022-04-21: 2000 mg via INTRAVENOUS
  Filled 2022-04-21: qty 400

## 2022-04-21 MED ORDER — CLONIDINE HCL 0.2 MG PO TABS
0.2000 mg | ORAL_TABLET | Freq: Three times a day (TID) | ORAL | Status: DC
  Administered 2022-04-21 – 2022-04-23 (×6): 0.2 mg via ORAL
  Filled 2022-04-21 (×6): qty 1

## 2022-04-21 MED ORDER — HYDRALAZINE HCL 20 MG/ML IJ SOLN: 10.0000 mg | INTRAMUSCULAR | Status: DC | PRN

## 2022-04-21 MED ORDER — AMLODIPINE BESYLATE 5 MG PO TABS
10.0000 mg | ORAL_TABLET | Freq: Every day | ORAL | Status: DC
  Administered 2022-04-22 – 2022-04-23 (×2): 10 mg via ORAL
  Filled 2022-04-21 (×2): qty 2

## 2022-04-21 MED ORDER — BUSPIRONE HCL 5 MG PO TABS
5.0000 mg | ORAL_TABLET | Freq: Two times a day (BID) | ORAL | Status: DC
  Administered 2022-04-21 – 2022-04-23 (×4): 5 mg via ORAL
  Filled 2022-04-21 (×4): qty 1

## 2022-04-21 NOTE — Plan of Care (Signed)

## 2022-04-21 NOTE — Progress Notes (Signed)
Patient transferred from ICU around 1350 with several family members present. Patient is pleasantly confused, she has started having some hallucinations, MD aware and has been to the bedside to discuss this with the family as they were concerned. Patient is sitting up in the recliner and refuses to get in the bed, family also states she sleeps in a recliner at home and will be up and down if we put her in the bed.

## 2022-04-21 NOTE — Progress Notes (Signed)
Briefly, Patient with severe symptomatic anemia without overt GI bleeding. She has adequately responded to blood transfusion. Hgb stable at 10.6 today s/p 4 units. Planning for EGD +/- dilation tomorrow as patient also reported some dysphagia. Will make NPO at might in preparation of endoscopic evaluation tomorrow.

## 2022-04-21 NOTE — Evaluation (Signed)
Occupational Therapy Evaluation Patient Details Name: [name] MRN: [id number] DOB: Apr 06, 1940 Today's Date: 04/21/2022   History of Present Illness [name] is a 82 y.o. female with medical history significant of iron deficiency anemia, GI bleed, PE on Pradaxa, history of atrial fibrillation, depression, memory loss, hypertension, hyperlipidemia, diabetes mellitus type 2, and more presents the ED with a chief complaint of generalized weakness.  Patient reports that she has had generalized weakness for several weeks.  It has been progressively worse since it started.  She reports she fell several weeks ago.  Son at bedside reports that she ambulates with a walker from her chair to the commode.  The rest the time she uses a wheelchair.  She sleeps in a recliner chair at night.  She has been more short of breath with exertion.  Patient reports that she did have chest pain prior to coming to the ER but it resolved spontaneously.  Son notes that patient was wheezing.  Patient reports she uses an inhaler at home and it did help temporarily.  She had a cough but it is not nonproductive.  She denies any fever.  She denies any bruising, hematuria, hematemesis, melena, hematochezia, epistaxis, or blood anywhere it should be.  She denies paresthesias.  She reports she does sometimes get dizzy upon standing but that is not new.  Patient does wear 2 L nasal cannula at baseline and that is what she is requiring in the ER.  Patient has no other complaints at this time.  She reports she knew her blood was low because it felt the same as when she had a transfusion in March 2021.  On chart review patient had Hemoccult positive stools during that admission.  She was already on Pradaxa at that point as well.  Pradaxa was held and patient was transfused with 2 units.  GI consultation was done because patient had had previously identified duodenal lesions that had been bleeding.  EGD was done, and colonoscopy as well.   Patient was noted to have chronic findings that would be consistent with an acute bleed.  Patient was discharged home and restarted Pradaxa.  Patient's baseline hemoglobin seems to be around 9, but it is really all over the place.  Today it is 4.3.  Son at bedside reports she had been on an iron pill in the past, but is not on one currently and he does not know why it was discontinued. (Per DO)   Clinical Impression   Pt agreeable to OT and PT co-evaluation. Pt is assisted for some ADL's at baseline. PRN assist at baseline for transfers. Today pt was generally weak with much time and labored effort for bed mobility. Pt was able to transfer to the chair with min A using the RW. Assist needed to don socks at bed level. Pt reports 24/7 assist but level of support will need to be confirmed. Pt recommended for SNF at this time unless family can provide needed support. Pt will benefit from continued OT in the hospital and recommended venue below to increase strength, balance, and endurance for safe ADL's.        Recommendations for follow up therapy are one component of a multi-disciplinary discharge planning process, led by the attending physician.  Recommendations may be updated based on patient status, additional functional criteria and insurance authorization.   Follow Up Recommendations  Skilled nursing-short term rehab (<3 hours/day)     Assistance Recommended at Discharge Intermittent Supervision/Assistance  Patient can return  home with the following A little help with walking and/or transfers;A lot of help with bathing/dressing/bathroom;Assistance with cooking/housework;Assist for transportation;Help with stairs or ramp for entrance    Functional Status Assessment  Patient has had a recent decline in their functional status and demonstrates the ability to make significant improvements in function in a reasonable and predictable amount of time.  Equipment Recommendations  None recommended by  OT    Recommendations for Other Services       Precautions / Restrictions Precautions Precautions: Fall Restrictions Weight Bearing Restrictions: No      Mobility Bed Mobility Overal bed mobility: Needs Assistance Bed Mobility: Supine to Sit     Supine to sit: Mod assist     General bed mobility comments: Labored effort; use of bed rail; assist to scoot to EOB.    Transfers Overall transfer level: Needs assistance Equipment used: Rolling walker (2 wheels) Transfers: Sit to/from Stand, Bed to chair/wheelchair/BSC Sit to Stand: Min assist     Step pivot transfers: Min assist     General transfer comment: Labored movement; assist to boost from EOB and step pivot to chair.      Balance Overall balance assessment: Needs assistance Sitting-balance support: Feet supported, Bilateral upper extremity supported Sitting balance-Leahy Scale: Fair Sitting balance - Comments: seated at EOB   Standing balance support: Bilateral upper extremity supported, During functional activity, Reliant on assistive device for balance Standing balance-Leahy Scale: Fair Standing balance comment: poor to fair with RW                           ADL either performed or assessed with clinical judgement   ADL Overall ADL's : Needs assistance/impaired     Grooming: Minimal assistance;Moderate assistance;Sitting   Upper Body Bathing: Minimal assistance;Sitting   Lower Body Bathing: Maximal assistance;Sitting/lateral leans   Upper Body Dressing : Minimal assistance;Sitting   Lower Body Dressing: Maximal assistance;Bed level Lower Body Dressing Details (indicate cue type and reason): Assited to don socks at bed level. Toilet Transfer: Minimal assistance;Stand-pivot;Rolling walker (2 wheels) Toilet Transfer Details (indicate cue type and reason): Simulated via EOB to chair transfer. Toileting- Clothing Manipulation and Hygiene: Moderate assistance;Sitting/lateral lean        Functional mobility during ADLs: Minimal assistance;Moderate assistance;Rolling walker (2 wheels)       Vision Baseline Vision/History: 1 Wears glasses Ability to See in Adequate Light: 0 Adequate Patient Visual Report: No change from baseline Vision Assessment?: No apparent visual deficits                Pertinent Vitals/Pain Pain Assessment Pain Assessment: Faces Faces Pain Scale: Hurts little more Pain Location: L leg and head Pain Descriptors / Indicators: Grimacing, Guarding Pain Intervention(s): Limited activity within patient's tolerance, Monitored during session, Repositioned     Hand Dominance Right   Extremity/Trunk Assessment Upper Extremity Assessment Upper Extremity Assessment: Generalized weakness (Limited to 25 to 50% available shoulder flexion A/ROM bilateral. 75% available shoulder flexion P/ROM. Generally weak otherwise.)   Lower Extremity Assessment Lower Extremity Assessment: Defer to PT evaluation   Cervical / Trunk Assessment Cervical / Trunk Assessment: Kyphotic   Communication Communication Communication: No difficulties   Cognition Arousal/Alertness: Awake/alert Behavior During Therapy: WFL for tasks assessed/performed Overall Cognitive Status: Within Functional Limits for tasks assessed  Home Living Family/patient expects to be discharged to:: Private residence Living Arrangements: Alone Available Help at Discharge: Family;Available 24 hours/day Type of Home: House Home Access: Ramped entrance     Home Layout: One level     Bathroom Shower/Tub: Occupational psychologist:  (Pt was unsure) Bathroom Accessibility: Yes How Accessible: Accessible via walker Home Equipment: Rolling Walker (2 wheels);BSC/3in1;Shower seat;Hospital bed;Other (comment) (lift chair)          Prior Functioning/Environment Prior Level of Function : Needs assist        Physical Assist : Mobility (physical);ADLs (physical) Mobility (physical): Transfers;Gait ADLs (physical): Bathing;Dressing;Toileting;IADLs Mobility Comments: PRN assist for transfers to chair and toilet from w/c using RW. w/c mobility within the home. ADLs Comments: Assisted for bathing, dressing, and toilet transfer. Assisted for IADL's.        OT Problem List: Decreased strength;Decreased range of motion;Decreased activity tolerance;Impaired balance (sitting and/or standing);Obesity      OT Treatment/Interventions: Self-care/ADL training;Therapeutic exercise;Therapeutic activities;Patient/family education;Balance training;DME and/or AE instruction    OT Goals(Current goals can be found in the care plan section) Acute Rehab OT Goals Patient Stated Goal: return home OT Goal Formulation: With patient Time For Goal Achievement: 05/05/22 Potential to Achieve Goals: Good  OT Frequency: Min 2X/week    Co-evaluation PT/OT/SLP Co-Evaluation/Treatment: Yes Reason for Co-Treatment: To address functional/ADL transfers   OT goals addressed during session: ADL's and self-care                       End of Session Equipment Utilized During Treatment: Rolling walker (2 wheels);Oxygen (3 L supplemental O2)  Activity Tolerance: Patient tolerated treatment well Patient left: in chair;with call bell/phone within reach  OT Visit Diagnosis: Unsteadiness on feet (R26.81);Other abnormalities of gait and mobility (R26.89);Muscle weakness (generalized) (M62.81);History of falling (Z91.81)                Time: [id number] OT Time Calculation (min): 21 min Charges:  OT General Charges $OT Visit: 1 Visit OT Evaluation $OT Eval Low Complexity: 1 Low  Arica Bevilacqua OT, MOT  Larey Seat 04/21/2022, 9:00 AM

## 2022-04-21 NOTE — Evaluation (Signed)
Physical Therapy Evaluation Patient Details Name: [name] MRN: [id number] DOB: 1940/04/16 Today's Date: 04/21/2022  History of Present Illness  [name] is a 82 y.o. female with medical history significant of iron deficiency anemia, GI bleed, PE on Pradaxa, history of atrial fibrillation, depression, memory loss, hypertension, hyperlipidemia, diabetes mellitus type 2, and more presents the ED with a chief complaint of generalized weakness.  Patient reports that she has had generalized weakness for several weeks.  It has been progressively worse since it started.  She reports she fell several weeks ago.  Son at bedside reports that she ambulates with a walker from her chair to the commode.  The rest the time she uses a wheelchair.  She sleeps in a recliner chair at night.  She has been more short of breath with exertion.  Patient reports that she did have chest pain prior to coming to the ER but it resolved spontaneously.  Son notes that patient was wheezing.  Patient reports she uses an inhaler at home and it did help temporarily.  She had a cough but it is not nonproductive.  She denies any fever.  She denies any bruising, hematuria, hematemesis, melena, hematochezia, epistaxis, or blood anywhere it should be.  She denies paresthesias.  She reports she does sometimes get dizzy upon standing but that is not new.  Patient does wear 2 L nasal cannula at baseline and that is what she is requiring in the ER.  Patient has no other complaints at this time.  She reports she knew her blood was low because it felt the same as when she had a transfusion in March 2021.  On chart review patient had Hemoccult positive stools during that admission.  She was already on Pradaxa at that point as well.  Pradaxa was held and patient was transfused with 2 units.  GI consultation was done because patient had had previously identified duodenal lesions that had been bleeding.  EGD was done, and colonoscopy as well.   Patient was noted to have chronic findings that would be consistent with an acute bleed.  Patient was discharged home and restarted Pradaxa.  Patient's baseline hemoglobin seems to be around 9, but it is really all over the place.  Today it is 4.3.  Son at bedside reports she had been on an iron pill in the past, but is not on one currently and he does not know why it was discontinued.   Clinical Impression  Patient demonstrates slow labored movement for sitting up at bedside with most difficulty scooting to EOB, unsteady labored movement during sit to stands and during transfer to chair secondary to weakness.  Patient limited to a few side steps and unable to attempt taking steps forward away from bedside due to fall risk and BLE weakness.  Patient tolerated sitting up in chair after therapy - nursing staff notified.  Patient will benefit from continued skilled physical therapy in hospital and recommended venue below to increase strength, balance, endurance for safe ADLs and gait.          Recommendations for follow up therapy are one component of a multi-disciplinary discharge planning process, led by the attending physician.  Recommendations may be updated based on patient status, additional functional criteria and insurance authorization.  Follow Up Recommendations Skilled nursing-short term rehab (<3 hours/day) Can patient physically be transported by private vehicle: No    Assistance Recommended at Discharge Set up Supervision/Assistance  Patient can return home with the following  A lot of help with walking and/or transfers;A lot of help with bathing/dressing/bathroom;Help with stairs or ramp for entrance;Assistance with cooking/housework    Equipment Recommendations None recommended by PT  Recommendations for Other Services       Functional Status Assessment Patient has had a recent decline in their functional status and demonstrates the ability to make significant improvements in  function in a reasonable and predictable amount of time.     Precautions / Restrictions Precautions Precautions: Fall Restrictions Weight Bearing Restrictions: No      Mobility  Bed Mobility Overal bed mobility: Needs Assistance Bed Mobility: Supine to Sit     Supine to sit: Mod assist     General bed mobility comments: increased time, labored movement, most diffiuclty scooting to EOB    Transfers Overall transfer level: Needs assistance Equipment used: Rolling walker (2 wheels) Transfers: Sit to/from Stand, Bed to chair/wheelchair/BSC Sit to Stand: Min assist   Step pivot transfers: Min assist       General transfer comment: unsteady labored movement    Ambulation/Gait Ambulation/Gait assistance: Mod assist Gait Distance (Feet): 4 Feet Assistive device: Rolling walker (2 wheels) Gait Pattern/deviations: Decreased step length - right, Decreased step length - left, Decreased stride length, Trunk flexed Gait velocity: slow     General Gait Details: limited to a few slow labored side steps before having to sit due to c/o fatigue and weakness  Stairs            Wheelchair Mobility    Modified Rankin (Stroke Patients Only)       Balance Overall balance assessment: Needs assistance Sitting-balance support: Feet supported, No upper extremity supported Sitting balance-Leahy Scale: Fair Sitting balance - Comments: seated at EOB   Standing balance support: Bilateral upper extremity supported, During functional activity, Reliant on assistive device for balance Standing balance-Leahy Scale: Poor Standing balance comment: fair/poor using RW                             Pertinent Vitals/Pain Pain Assessment Pain Assessment: Faces Faces Pain Scale: Hurts little more Pain Location: L leg and head Pain Descriptors / Indicators: Grimacing, Guarding, Sore Pain Intervention(s): Limited activity within patient's tolerance, Monitored during session,  Repositioned    Home Living Family/patient expects to be discharged to:: Private residence Living Arrangements: Alone Available Help at Discharge: Family;Available 24 hours/day Type of Home: House Home Access: Ramped entrance       Home Layout: One level Home Equipment: Conservation officer, nature (2 wheels);BSC/3in1;Shower seat;Hospital bed;Other (comment)      Prior Function Prior Level of Function : Needs assist       Physical Assist : Mobility (physical);ADLs (physical) Mobility (physical): Bed mobility;Transfers;Gait;Stairs ADLs (physical): Bathing;Dressing;Toileting;IADLs Mobility Comments: Short distanced household ambulator using RW, uses wheelchair mostly ADLs Comments: Assisted for bathing, dressing, and toilet transfer. Assisted for IADL's.     Hand Dominance   Dominant Hand: Right    Extremity/Trunk Assessment   Upper Extremity Assessment Upper Extremity Assessment: Defer to OT evaluation    Lower Extremity Assessment Lower Extremity Assessment: Generalized weakness    Cervical / Trunk Assessment Cervical / Trunk Assessment: Kyphotic  Communication   Communication: No difficulties  Cognition Arousal/Alertness: Awake/alert Behavior During Therapy: WFL for tasks assessed/performed Overall Cognitive Status: Within Functional Limits for tasks assessed  General Comments      Exercises     Assessment/Plan    PT Assessment Patient needs continued PT services  PT Problem List Decreased strength;Decreased activity tolerance;Decreased balance;Decreased mobility       PT Treatment Interventions DME instruction;Gait training;Stair training;Functional mobility training;Therapeutic activities;Therapeutic exercise;Patient/family education;Balance training    PT Goals (Current goals can be found in the Care Plan section)  Acute Rehab PT Goals Patient Stated Goal: return home with family to assist PT Goal  Formulation: With patient Time For Goal Achievement: 05/05/22 Potential to Achieve Goals: Good    Frequency Min 3X/week     Co-evaluation PT/OT/SLP Co-Evaluation/Treatment: Yes Reason for Co-Treatment: To address functional/ADL transfers PT goals addressed during session: Mobility/safety with mobility;Balance;Proper use of DME OT goals addressed during session: ADL's and self-care       AM-PAC PT "6 Clicks" Mobility  Outcome Measure Help needed turning from your back to your side while in a flat bed without using bedrails?: A Lot Help needed moving from lying on your back to sitting on the side of a flat bed without using bedrails?: A Lot Help needed moving to and from a bed to a chair (including a wheelchair)?: A Lot Help needed standing up from a chair using your arms (e.g., wheelchair or bedside chair)?: A Little Help needed to walk in hospital room?: A Lot Help needed climbing 3-5 steps with a railing? : Total 6 Click Score: 12    End of Session Equipment Utilized During Treatment: Oxygen Activity Tolerance: Patient tolerated treatment well;Patient limited by fatigue Patient left: in chair;with call bell/phone within reach Nurse Communication: Mobility status PT Visit Diagnosis: Unsteadiness on feet (R26.81);Other abnormalities of gait and mobility (R26.89);Muscle weakness (generalized) (M62.81)    Time: [id number] PT Time Calculation (min) (ACUTE ONLY): 30 min   Charges:   PT Evaluation $PT Eval Moderate Complexity: 1 Mod PT Treatments $Therapeutic Activity: 23-37 mins        11:57 AM, 04/21/22 Lonell Grandchild, MPT Physical Therapist with [facility] [phone] office [phone] mobile phone

## 2022-04-21 NOTE — TOC Initial Note (Signed)
Transition of Care [facility]) - Initial/Assessment Note    Patient Details  Name: [name] MRN: [id number] Date of Birth: 13-Oct-1940  Transition of Care [facility]) CM/SW Contact:    Boneta Lucks, RN Phone Number: 04/21/2022, 2:06 PM  Clinical Narrative:     Patient admitted with acute anemia. PT is recommending for SNF if family can not assist with transfers.  CM called son, the number is incorrect. Call her sister, she is at the bedside. She said it would be her sons decision. He works from home and lives with her. He moved in a while back to care for her. She will have him call me back. She has been at [facility] in the past and has had COVID vaccines. TOC following.               Expected Discharge Plan: Home/Self Care Barriers to Discharge: Continued Medical Work up  Patient Goals and CMS Choice       Prior Living Arrangements/Services      Activities of Daily Living Home Assistive Devices/Equipment: Environmental consultant (specify type) ADL Screening (condition at time of admission) Patient's cognitive ability adequate to safely complete daily activities?: Yes Is the patient deaf or have difficulty hearing?: No Does the patient have difficulty seeing, even when wearing glasses/contacts?: No Does the patient have difficulty concentrating, remembering, or making decisions?: Yes Patient able to express need for assistance with ADLs?: Yes Does the patient have difficulty dressing or bathing?: Yes Independently performs ADLs?: No Communication: Needs assistance Is this a change from baseline?: Pre-admission baseline Dressing (OT): Needs assistance Is this a change from baseline?: Pre-admission baseline Grooming: Needs assistance Is this a change from baseline?: Pre-admission baseline Feeding: Needs assistance Is this a change from baseline?: Pre-admission baseline Bathing: Needs assistance Is this a change from baseline?: Pre-admission baseline Toileting: Needs assistance Is this a change  from baseline?: Pre-admission baseline In/Out Bed: Needs assistance Is this a change from baseline?: Pre-admission baseline Walks in Home: Needs assistance Is this a change from baseline?: Pre-admission baseline Does the patient have difficulty walking or climbing stairs?: Yes Weakness of Legs: Both Weakness of Arms/Hands: None  Permission Sought/Granted     Emotional Assessment      Orientation: : Oriented to Self Alcohol / Substance Use: Not Applicable Psych Involvement: No (comment)  Admission diagnosis:  Symptomatic anemia [D64.9] Acute anemia [D64.9] Patient Active Problem List   Diagnosis Date Noted   Acute anemia 04/20/2022   Benign hypertensive heart and CKD, 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), w CHF ([place]) 03/21/2022   DNR (do not resuscitate) 08/12/2021   Need for home health care 08/27/2020   Pain management contract discussed 03/15/2020   CKD stage G3b/A1, GFR 30-44 and albumin creatinine ratio <30 mg/g ([place]) 04/20/2019   Esophageal dysphagia 01/26/2019   Dysuria 04/12/2018   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   History of pulmonary embolism 01/25/2018   Encounter for chronic pain management 07/17/2016   Chronic pain syndrome 02/03/2016   Nausea 09/03/2014   Bilateral knee pain 04/23/2014   Type 2 diabetes mellitus with neurological complications ([place]) [id number]   Right-sided low back pain with right-sided sciatica 07/02/2013   Seasonal allergies 04/11/2013   Urinary incontinence [id number]   Metabolic syndrome X [id number]   Chronic anticoagulation 12/22/2012   Vitamin D deficiency 11/05/2012   Memory loss 11/01/2012   Depression with anxiety 11/01/2012   Anemia, iron deficiency    Morbid obesity ([place]) 08/16/2008   Globus pharyngeus 01/26/2008   Hyperlipidemia LDL goal <100  05/30/2007   Migraine 05/30/2007   Essential hypertension 05/30/2007   Gastroesophageal reflux disease 05/30/2007   PCP:  [name], MD Pharmacy:   [place], [place] - [address] [place] [address] Phone: [phone] Fax: [phone]  [facility] [address] [place] - [place] [address] [place] [address] [place] [address] [place] [address] Phone: [phone] Fax: [phone]   Social Determinants of Health (SDOH) Social History: SDOH Screenings   Food Insecurity: No Food Insecurity (04/20/2022)  Housing: Low Risk  (04/20/2022)  Transportation Needs: No Transportation Needs (04/20/2022)  Utilities: Not At Risk (04/20/2022)  Alcohol Screen: Low Risk  (09/11/2021)  Depression (PHQ2-9): Low Risk  (10/30/2021)  Financial Resource Strain: Low Risk  (09/11/2021)  Physical Activity: Inactive (09/11/2021)  Social Connections: Socially Isolated (09/11/2021)  Stress: No Stress Concern Present (09/11/2021)  Tobacco Use: Low Risk  (04/20/2022)   SDOH Interventions:    Readmission Risk Interventions    04/21/2022    2:05 PM  Readmission Risk Prevention Plan  Transportation Screening Complete  PCP or Specialist Appt within 5-7 Days Not Complete  Home Care Screening Complete  Medication Review (RN CM) Complete

## 2022-04-21 NOTE — Plan of Care (Signed)
  Problem: Acute Rehab OT Goals (only OT should resolve) Goal: Pt. Will Perform Grooming Flowsheets (Taken 04/21/2022 0903) Pt Will Perform Grooming:  with supervision  sitting Goal: Pt. Will Perform Upper Body Dressing Flowsheets (Taken 04/21/2022 0903) Pt Will Perform Upper Body Dressing:  with supervision  sitting Goal: Pt. Will Perform Lower Body Dressing Flowsheets (Taken 04/21/2022 0903) Pt Will Perform Lower Body Dressing:  with min assist  sitting/lateral leans Goal: Pt. Will Transfer To Toilet Flowsheets (Taken 04/21/2022 [phone]) Pt Will Transfer to Toilet:  stand pivot transfer  with supervision Goal: Pt. Will Perform Toileting-Clothing Manipulation Flowsheets (Taken 04/21/2022 0903) Pt Will Perform Toileting - Clothing Manipulation and hygiene:  with supervision  sitting/lateral leans Goal: Pt/Caregiver Will Perform Home Exercise Program Flowsheets (Taken 04/21/2022 [phone]) Pt/caregiver will Perform Home Exercise Program:  Increased ROM  Increased strength  Both right and left upper extremity  Independently  Rosealee Recinos OT, MOT

## 2022-04-21 NOTE — Progress Notes (Signed)
Pharmacy Antibiotic Note  [name] is a 82 y.o. female admitted on 04/19/2022 with  generalized weakness.  Pharmacy has been consulted for Vancomycin dosing. BCID positive for Staphylococcus epidermidis with resistance marker. WBC 11.9, RR up to 25   Plan: Vancomcyin '2000mg'$  IV x1 then 750 MG IV q24 hours (eAUC 438)  Height: '5\' 7"'$  (170.2 cm) Weight: 110.2 kg (242 lb 15.2 oz) IBW/kg (Calculated) : 61.6  Temp (24hrs), Avg:98.4 F (36.9 C), Min:97.8 F (36.6 C), Max:98.9 F (37.2 C)  Recent Labs  Lab 04/19/22 2255 04/19/22 2305 04/20/22 1128  WBC 11.7*  --  11.9*  CREATININE 1.50*  --  1.31*  LATICACIDVEN  --  1.9 1.4    Estimated Creatinine Clearance: 42.3 mL/min (A) (by C-G formula based on SCr of 1.31 mg/dL (H)).    Allergies  Allergen Reactions   Penicillins Itching    Did it involve swelling of the face/tongue/throat, SOB, or low BP? No Did it involve sudden or severe rash/hives, skin peeling, or any reaction on the inside of your mouth or nose? No Did you need to seek medical attention at a hospital or doctor's office? No When did it last happen?  many years ago    If all above answers are "NO", may proceed with cephalosporin use.     Fentanyl Itching   Sulfonamide Derivatives Hives   Sulfur    Codeine Itching and Rash    tussionex  Is tolerated by patient, no phenergan dm     Thank you for allowing pharmacy to be a part of this patient's care.  Jodean Lima Aleeha Boline 04/21/2022 3:38 AM

## 2022-04-21 NOTE — Progress Notes (Signed)
PROGRESS NOTE    [name]  [id number] DOB: 06-Jul-1940 DOA: 04/19/2022 PCP: [name], MD   Brief Narrative:    [name] is a 82 y.o. female with medical history significant of iron deficiency anemia, GI bleed, PE on Pradaxa, history of atrial fibrillation, depression, memory loss, hypertension, hyperlipidemia, diabetes mellitus type 2, and more --- presents the ED with a chief complaint of generalized weakness and fell  for several weeks.  It has been progressively worse since it started.  She was admitted with acute symptomatic anemia and required total 4 unit PRBC transfusion.  GI and palliative care following with plans for upper endoscopy 3/7.  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)   DNR (do not resuscitate)  Assessment and Plan:  Acute symptomatic blood loss anemia - Continue to hold Pradaxa -GI planning for upper endoscopy 3/7 -Status post 4 unit PRBC transfusion -Continue to monitor repeat CBC with H/H today     DNR (do not resuscitate) - ACP documents reviewed, DNR on file - This is confirmed with conversation with patient at bedside   CKD stage G3b/A1, 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) - Creatinine is at baseline 1.50 - Continue to monitor   History of pulmonary embolism - Holding Pradaxa due to acute anemia   Type 2 diabetes mellitus with neurological complications ([place]) - Patient is on 70/30, 55 units at baseline - Reduced to 20 units basal insulin with sliding scale coverage   - Continue to monitor CBGs  Depression with anxiety - Continue BuSpar and imipramine   Memory loss - Continue Aricept   Essential  hypertension-uncontrolled - Continue Norvasc, clonidine, ARB -IV hydralazine as needed   Hyperlipidemia LDL goal <100 - Continue Lipitor  Obesity -BMI 37.08  DVT prophylaxis:SCDs Code Status: DNR Family Communication: None at bedside Disposition Plan:  Status is: Inpatient Remains inpatient appropriate because: Need for IV medications.  Consultants:  GI Palliative  Procedures:  None  Antimicrobials:  None   Subjective: Patient seen and evaluated today and appears minimally confused at baseline.  Denies any significant complaints or concerns.  Objective: Vitals:   04/21/22 0430 04/21/22 0432 04/21/22 0500 04/21/22 0729  BP: (!) 200/126 (!) 210/102    Pulse: 83 89    Resp: (!) 22 (!) 24    Temp:    98 F (36.7 C)  TempSrc:    Oral  SpO2: 100% 100%    Weight:   107.4 kg   Height:        Intake/Output Summary (Last 24 hours) at 04/21/2022 1020 Last data filed at 04/21/2022 [id number] Gross per 24 hour  Intake 1439.47 ml  Output 4550 ml  Net -3110.53 ml   Filed Weights   04/19/22 2238 04/20/22 1110 04/21/22 0500  Weight: 109.8 kg 110.2 kg 107.4 kg    Examination:  General exam: Appears minimally confused, hard of hearing, obese Respiratory system: Clear to auscultation. Respiratory effort normal.  3 L nasal cannula Cardiovascular system: S1 & S2 heard, RRR.  Gastrointestinal system: Abdomen is soft Central nervous system: Alert and awake Extremities: No edema Skin: No significant lesions noted Psychiatry: Flat affect.  Data Reviewed: I have personally reviewed following labs and imaging studies  CBC: Recent Labs  Lab 04/19/22 2255 04/20/22 1128  WBC 11.7* 11.9*  NEUTROABS  --  9.2*  HGB 4.3* 7.7*  HCT 15.2* 24.4*  MCV 80.0 81.3  PLT 374 [id number]   Basic Metabolic Panel: Recent Labs  Lab 04/19/22 2255 04/20/22 1128  NA 137 138  K 4.0 4.1  CL 104 103  CO2 24 22  GLUCOSE 154* 167*  BUN 24* 19  CREATININE 1.50* 1.31*  CALCIUM 8.4* 8.3*  MG   --  2.1   GFR: Estimated Creatinine Clearance: 41.8 mL/min (A) (by C-G formula based on SCr of 1.31 mg/dL (H)). Liver Function Tests: Recent Labs  Lab 04/20/22 1128  AST 15  ALT 11  ALKPHOS 99  BILITOT 0.1*  PROT 7.1  ALBUMIN 3.2*   No results for input(s): "LIPASE", "AMYLASE" in the last 168 hours. No results for input(s): "AMMONIA" in the last 168 hours. Coagulation Profile: Recent Labs  Lab 04/19/22 2255  INR 1.3*   Cardiac Enzymes: No results for input(s): "CKTOTAL", "CKMB", "CKMBINDEX", "TROPONINI" in the last 168 hours. BNP (last 3 results) No results for input(s): "PROBNP" in the last 8760 hours. HbA1C: No results for input(s): "HGBA1C" in the last 72 hours. CBG: Recent Labs  Lab 04/20/22 0749 04/20/22 1159 04/20/22 1548 04/20/22 2109 04/21/22 0732  GLUCAP 108* 142* 132* 147* 167*   Lipid Profile: No results for input(s): "CHOL", "HDL", "LDLCALC", "TRIG", "CHOLHDL", "LDLDIRECT" in the last 72 hours. Thyroid Function Tests: No results for input(s): "TSH", "T4TOTAL", "FREET4", "T3FREE", "THYROIDAB" in the last 72 hours. Anemia Panel: Recent Labs    04/20/22 1128  VITAMINB12 224  FOLATE 11.6  FERRITIN 11  TIBC 378  IRON 53  RETICCTPCT 1.6   Sepsis Labs: Recent Labs  Lab 04/19/22 2305 04/20/22 1128  LATICACIDVEN 1.9 1.4    Recent Results (from the past 240 hour(s))  Resp panel by RT-PCR (RSV, Flu A&B, Covid) Anterior Nasal Swab     Status: None   Collection Time: 04/19/22 10:43 PM   Specimen: Anterior Nasal Swab  Result Value Ref Range Status   SARS Coronavirus 2 by RT PCR NEGATIVE NEGATIVE Final    Comment: (NOTE) SARS-CoV-2 target nucleic acids are NOT DETECTED.  The SARS-CoV-2 RNA is generally detectable in upper respiratory specimens during the acute phase of infection. The lowest concentration of SARS-CoV-2 viral copies this assay can detect is 138 copies/mL. A negative result does not preclude SARS-Cov-2 infection and should not be  used as the sole basis for treatment or other patient management decisions. A negative result may occur with  improper specimen collection/handling, submission of specimen other than nasopharyngeal swab, presence of viral mutation(s) within the areas targeted by this assay, and inadequate number of viral copies(<138 copies/mL). A negative result must be combined with clinical observations, patient history, and epidemiological information. The expected result is Negative.  Fact Sheet for Patients:  EntrepreneurPulse.com.au  Fact Sheet for Healthcare Providers:  IncredibleEmployment.be  This test is no t yet approved or cleared by the [place] FDA and  has been authorized for detection and/or diagnosis of SARS-CoV-2 by FDA under an Emergency Use Authorization (EUA). This EUA will remain  in effect (meaning this test can be used) for the duration of the COVID-19 declaration under Section 564(b)(1) of the Act, 21 U.S.C.section 360bbb-3(b)(1), unless the authorization is terminated  or revoked sooner.       Influenza A by PCR NEGATIVE  NEGATIVE Final   Influenza B by PCR NEGATIVE NEGATIVE Final    Comment: (NOTE) The Xpert Xpress SARS-CoV-2/FLU/RSV plus assay is intended as an aid in the diagnosis of influenza from Nasopharyngeal swab specimens and should not be used as a sole basis for treatment. Nasal washings and aspirates are unacceptable for Xpert Xpress SARS-CoV-2/FLU/RSV testing.  Fact Sheet for Patients: EntrepreneurPulse.com.au  Fact Sheet for Healthcare Providers: IncredibleEmployment.be  This test is not yet approved or cleared by the [place] FDA and has been authorized for detection and/or diagnosis of SARS-CoV-2 by FDA under an Emergency Use Authorization (EUA). This EUA will remain in effect (meaning this test can be used) for the duration of the COVID-19 declaration under Section  564(b)(1) of the Act, 21 U.S.C. section 360bbb-3(b)(1), unless the authorization is terminated or revoked.     Resp Syncytial Virus by PCR NEGATIVE NEGATIVE Final    Comment: (NOTE) Fact Sheet for Patients: EntrepreneurPulse.com.au  Fact Sheet for Healthcare Providers: IncredibleEmployment.be  This test is not yet approved or cleared by the [place] FDA and has been authorized for detection and/or diagnosis of SARS-CoV-2 by FDA under an Emergency Use Authorization (EUA). This EUA will remain in effect (meaning this test can be used) for the duration of the COVID-19 declaration under Section 564(b)(1) of the Act, 21 U.S.C. section 360bbb-3(b)(1), unless the authorization is terminated or revoked.  Performed at [facility], [address] [place], [place] [address]   Culture, blood (Routine x 2)     Status: None (Preliminary result)   Collection Time: 04/19/22 10:55 PM   Specimen: BLOOD  Result Value Ref Range Status   Specimen Description   Final    BLOOD BLOOD LEFT ARM Performed at [facility], [address], [place], [place] [address]    Special Requests   Final    BOTTLES DRAWN AEROBIC AND ANAEROBIC Blood Culture adequate volume Performed at [place] [place] [address], [place] [address]    Culture  Setup Time   Final    GRAM POSITIVE COCCI Gram Stain Report Called to,Read Back By and Verified With: KINDLEY,C ON 04/20/22 AT 2235 BY LOY,C ANAEROBIC BOTTLE PERFORMED AT [facility] CRITICAL RESULT CALLED TO, READ BACK BY AND VERIFIED WITH: C KINDLEY,RN'@0216'$  04/21/22 [place] Performed at [facility], [address] [place], [place] [address]    Culture GRAM POSITIVE COCCI  Final   Report Status PENDING  Incomplete  Blood Culture ID Panel (Reflexed)     Status: Abnormal   Collection Time: 04/19/22 10:55 PM  Result Value Ref Range Status   Enterococcus faecalis NOT DETECTED NOT DETECTED Final   Enterococcus Faecium NOT DETECTED  NOT DETECTED Final   Listeria monocytogenes NOT DETECTED NOT DETECTED Final   Staphylococcus species DETECTED (A) NOT DETECTED Final    Comment: CRITICAL RESULT CALLED TO, READ BACK BY AND VERIFIED WITH: C KINDLEY,RN'@0217'$  04/21/22 [place]    Staphylococcus aureus (BCID) NOT DETECTED NOT DETECTED Final   Staphylococcus epidermidis DETECTED (A) NOT DETECTED Final    Comment: Methicillin (oxacillin) resistant coagulase negative staphylococcus. Possible blood culture contaminant (unless isolated from more than one blood culture draw or clinical case suggests pathogenicity). No antibiotic treatment is indicated for blood  culture contaminants. CRITICAL RESULT CALLED TO, READ BACK BY AND VERIFIED WITH: C KINDLEY,RN'@0217'$  04/21/22 [place]    Staphylococcus lugdunensis NOT DETECTED NOT DETECTED Final   Streptococcus species NOT DETECTED NOT DETECTED Final   Streptococcus agalactiae NOT DETECTED NOT DETECTED Final  Streptococcus pneumoniae NOT DETECTED NOT DETECTED Final   Streptococcus pyogenes NOT DETECTED NOT DETECTED Final   A.calcoaceticus-baumannii NOT DETECTED NOT DETECTED Final   Bacteroides fragilis NOT DETECTED NOT DETECTED Final   Enterobacterales NOT DETECTED NOT DETECTED Final   Enterobacter cloacae complex NOT DETECTED NOT DETECTED Final   Escherichia coli NOT DETECTED NOT DETECTED Final   Klebsiella aerogenes NOT DETECTED NOT DETECTED Final   Klebsiella oxytoca NOT DETECTED NOT DETECTED Final   Klebsiella pneumoniae NOT DETECTED NOT DETECTED Final   Proteus species NOT DETECTED NOT DETECTED Final   Salmonella species NOT DETECTED NOT DETECTED Final   Serratia marcescens NOT DETECTED NOT DETECTED Final   Haemophilus influenzae NOT DETECTED NOT DETECTED Final   Neisseria meningitidis NOT DETECTED NOT DETECTED Final   Pseudomonas aeruginosa NOT DETECTED NOT DETECTED Final   Stenotrophomonas maltophilia NOT DETECTED NOT DETECTED Final   Candida albicans NOT DETECTED NOT DETECTED Final    Candida auris NOT DETECTED NOT DETECTED Final   Candida glabrata NOT DETECTED NOT DETECTED Final   Candida krusei NOT DETECTED NOT DETECTED Final   Candida parapsilosis NOT DETECTED NOT DETECTED Final   Candida tropicalis NOT DETECTED NOT DETECTED Final   Cryptococcus neoformans/gattii NOT DETECTED NOT DETECTED Final   Methicillin resistance mecA/C DETECTED (A) NOT DETECTED Final    Comment: CRITICAL RESULT CALLED TO, READ BACK BY AND VERIFIED WITH: C KINDLEY,RN'@0217'$  04/21/22 [place] Performed at [facility], [address], [place], [place] [address]   Culture, blood (Routine x 2)     Status: None (Preliminary result)   Collection Time: 04/19/22 11:16 PM   Specimen: BLOOD  Result Value Ref Range Status   Specimen Description BLOOD BLOOD LEFT ARM  Final   Special Requests   Final    BOTTLES DRAWN AEROBIC AND ANAEROBIC Blood Culture adequate volume   Culture   Final    NO GROWTH 2 DAYS Performed at [facility], [address], [place], [place] [address]    Report Status PENDING  Incomplete  MRSA Next Gen by PCR, Nasal     Status: None   Collection Time: 04/20/22 11:00 AM   Specimen: Nasal Mucosa; Nasal Swab  Result Value Ref Range Status   MRSA by PCR Next Gen NOT DETECTED NOT DETECTED Final    Comment: (NOTE) The GeneXpert MRSA Assay (FDA approved for NASAL specimens only), is one component of a comprehensive MRSA colonization surveillance program. It is not intended to diagnose MRSA infection nor to guide or monitor treatment for MRSA infections. Test performance is not FDA approved in patients less than 58 years old. Performed at [facility], [address] [place], [place] [address]          Radiology Studies: DG CHEST PORT 1 VIEW  Result Date: 04/20/2022 CLINICAL DATA:  Wheezing. EXAM: PORTABLE CHEST 1 VIEW COMPARISON:  04/19/2022 FINDINGS: Stable cardiac enlargement and prominent/tortuous thoracic aorta. Stable mild elevation of the left hemidiaphragm. There is no  evidence of pulmonary edema, consolidation, pneumothorax or pleural fluid. IMPRESSION: Stable cardiac enlargement and prominent/tortuous thoracic aorta. No acute findings. Electronically Signed   By: Aletta Edouard M.D.   On: 04/20/2022 16:18   DG Chest Port 1 View  Result Date: 04/19/2022 CLINICAL DATA:  Weakness. EXAM: PORTABLE CHEST 1 VIEW COMPARISON:  Chest radiograph dated 11/10/2018 and CT dated 11/10/2018 FINDINGS: The patient is rotated. No focal consolidation, pleural effusion, pneumothorax. Mild cardiomegaly. There is widened appearance of mediastinum, likely related to rotation. Clinical correlation is  recommended. No acute osseous pathology. IMPRESSION: 1. No acute cardiopulmonary process. 2. Mild cardiomegaly. 3. Widened appearance of mediastinum, likely related to patient's rotation. Electronically Signed   By: Anner Crete M.D.   On: 04/19/2022 23:16        Scheduled Meds:  sodium chloride   Intravenous Once   sodium chloride   Intravenous Once   amLODipine  5 mg Oral Daily   atorvastatin  40 mg Oral Daily   busPIRone  7.5 mg Oral TID   Chlorhexidine Gluconate Cloth  6 each Topical Daily   cloNIDine  0.2 mg Oral BID   donepezil  10 mg Oral QHS   imipramine  100 mg Oral QHS   insulin aspart  0-15 Units Subcutaneous TID WC   insulin aspart  0-5 Units Subcutaneous QHS   insulin detemir  20 Units Subcutaneous QHS   irbesartan  150 mg Oral Daily   pantoprazole (PROTONIX) IV  40 mg Intravenous Q12H     LOS: 1 day    Time spent: 35 minutes    Miraj Truss Darleen Crocker, DO [facility]  If 7PM-7AM, please contact night-coverage [email or web address] 04/21/2022, 10:20 AM

## 2022-04-21 NOTE — Plan of Care (Signed)
  Problem: Acute Rehab PT Goals(only PT should resolve) Goal: Pt Will Go Supine/Side To Sit Outcome: Progressing Flowsheets (Taken 04/21/2022 1158) Pt will go Supine/Side to Sit:  with minimal assist  with min guard assist Goal: Patient Will Transfer Sit To/From Stand Outcome: Progressing Flowsheets (Taken 04/21/2022 1158) Patient will transfer sit to/from stand:  with min guard assist  with minimal assist Goal: Pt Will Transfer Bed To Chair/Chair To Bed Outcome: Progressing Flowsheets (Taken 04/21/2022 1158) Pt will Transfer Bed to Chair/Chair to Bed:  with min assist  min guard assist Goal: Pt Will Ambulate Outcome: Progressing Flowsheets (Taken 04/21/2022 1158) Pt will Ambulate:  25 feet  with minimal assist  with rolling walker   11:59 AM, 04/21/22 Lonell Grandchild, MPT Physical Therapist with [facility] 336 [phone] office [phone] mobile phone

## 2022-04-22 ENCOUNTER — Encounter (HOSPITAL_COMMUNITY)
Admission: EM | Disposition: A | Discharge status: Home Health | Payer: Self-pay | Source: Home / Self Care | Attending: Internal Medicine

## 2022-04-22 ENCOUNTER — Inpatient Hospital Stay (HOSPITAL_COMMUNITY): Payer: Medicare HMO | Admitting: Registered Nurse

## 2022-04-22 ENCOUNTER — Telehealth (INDEPENDENT_AMBULATORY_CARE_PROVIDER_SITE_OTHER): Payer: Self-pay | Admitting: Gastroenterology

## 2022-04-22 DIAGNOSIS — D631 Anemia in chronic kidney disease: Secondary | ICD-10-CM

## 2022-04-22 DIAGNOSIS — E1122 Type 2 diabetes mellitus with diabetic chronic kidney disease: Secondary | ICD-10-CM | POA: Diagnosis not present

## 2022-04-22 DIAGNOSIS — D649 Anemia, unspecified: Secondary | ICD-10-CM | POA: Diagnosis not present

## 2022-04-22 DIAGNOSIS — K2970 Gastritis, unspecified, without bleeding: Secondary | ICD-10-CM

## 2022-04-22 DIAGNOSIS — Z794 Long term (current) use of insulin: Secondary | ICD-10-CM

## 2022-04-22 DIAGNOSIS — K31811 Angiodysplasia of stomach and duodenum with bleeding: Secondary | ICD-10-CM

## 2022-04-22 DIAGNOSIS — D509 Iron deficiency anemia, unspecified: Secondary | ICD-10-CM

## 2022-04-22 DIAGNOSIS — Z7189 Other specified counseling: Secondary | ICD-10-CM | POA: Diagnosis not present

## 2022-04-22 DIAGNOSIS — Z515 Encounter for palliative care: Secondary | ICD-10-CM | POA: Diagnosis not present

## 2022-04-22 DIAGNOSIS — I129 Hypertensive chronic kidney disease with stage 1 through stage 4 chronic kidney disease, or unspecified chronic kidney disease: Secondary | ICD-10-CM

## 2022-04-22 DIAGNOSIS — N183 Chronic kidney disease, stage 3 unspecified: Secondary | ICD-10-CM

## 2022-04-22 DIAGNOSIS — Z66 Do not resuscitate: Secondary | ICD-10-CM | POA: Diagnosis not present

## 2022-04-22 DIAGNOSIS — K2289 Other specified disease of esophagus: Secondary | ICD-10-CM

## 2022-04-22 DIAGNOSIS — K31819 Angiodysplasia of stomach and duodenum without bleeding: Secondary | ICD-10-CM

## 2022-04-22 HISTORY — PX: HOT HEMOSTASIS: SHX5433

## 2022-04-22 HISTORY — PX: BIOPSY: SHX5522

## 2022-04-22 HISTORY — PX: ESOPHAGOGASTRODUODENOSCOPY (EGD) WITH PROPOFOL: SHX5813

## 2022-04-22 LAB — BASIC METABOLIC PANEL
Anion gap: 10 (ref 5–15)
BUN: 18 mg/dL (ref 8–23)
CO2: 23 mmol/L (ref 22–32)
Calcium: 8.5 mg/dL — ABNORMAL LOW (ref 8.9–10.3)
Chloride: 103 mmol/L (ref 98–111)
Creatinine, Ser: 1.34 mg/dL — ABNORMAL HIGH (ref 0.44–1.00)
GFR, Estimated: 40 mL/min — ABNORMAL LOW (ref >60)
Glucose, Bld: 181 mg/dL — ABNORMAL HIGH (ref 70–99)
Potassium: 3.9 mmol/L (ref 3.5–5.1)
Sodium: 136 mmol/L (ref 135–145)

## 2022-04-22 LAB — CBC
HCT: 30.5 % — ABNORMAL LOW (ref 36.0–46.0)
Hemoglobin: 9.7 g/dL — ABNORMAL LOW (ref 12.0–15.0)
MCH: 25.9 pg — ABNORMAL LOW (ref 26.0–34.0)
MCHC: 31.8 g/dL (ref 30.0–36.0)
MCV: 81.6 fL (ref 80.0–100.0)
Platelets: 347 10*3/uL (ref 150–400)
RBC: 3.74 MIL/uL — ABNORMAL LOW (ref 3.87–5.11)
RDW: 16.5 % — ABNORMAL HIGH (ref 11.5–15.5)
WBC: 11.2 10*3/uL — ABNORMAL HIGH (ref 4.0–10.5)
nRBC: 0.2 % (ref 0.0–0.2)

## 2022-04-22 LAB — GLUCOSE, CAPILLARY
Glucose-Capillary: 196 mg/dL — ABNORMAL HIGH (ref 70–99)
Glucose-Capillary: 144 mg/dL — ABNORMAL HIGH (ref 70–99)
Glucose-Capillary: 148 mg/dL — ABNORMAL HIGH (ref 70–99)
Glucose-Capillary: 136 mg/dL — ABNORMAL HIGH (ref 70–99)
Glucose-Capillary: 235 mg/dL — ABNORMAL HIGH (ref 70–99)

## 2022-04-22 LAB — MAGNESIUM: Magnesium: 2.2 mg/dL (ref 1.7–2.4)

## 2022-04-22 SURGERY — ESOPHAGOGASTRODUODENOSCOPY (EGD) WITH PROPOFOL
Anesthesia: General

## 2022-04-22 MED ORDER — DABIGATRAN ETEXILATE MESYLATE 150 MG PO CAPS
150.0000 mg | ORAL_CAPSULE | Freq: Two times a day (BID) | ORAL | Status: DC
  Administered 2022-04-22 – 2022-04-23 (×2): 150 mg via ORAL
  Filled 2022-04-22 (×2): qty 1

## 2022-04-22 MED ORDER — PROPOFOL 10 MG/ML IV BOLUS
INTRAVENOUS | Status: DC | PRN
  Administered 2022-04-22: 20 mg via INTRAVENOUS
  Administered 2022-04-22: 50 mg via INTRAVENOUS
  Administered 2022-04-22 (×2): 20 mg via INTRAVENOUS

## 2022-04-22 MED ORDER — SODIUM CHLORIDE 0.9 % IV SOLN: INTRAVENOUS | Status: DC

## 2022-04-22 MED ORDER — LACTATED RINGERS IV SOLN: INTRAVENOUS | Status: DC | PRN

## 2022-04-22 NOTE — Telephone Encounter (Signed)
Hi Mitzie,  Can you please schedule a follow up appointment for this patient in 3-4 weeks with me or any of the APPs?  Thanks,  Maylon Peppers, MD Gastroenterology and Hepatology [facility] Gastroenterology

## 2022-04-22 NOTE — Brief Op Note (Signed)
04/19/2022 - 04/22/2022  3:35 PM  PATIENT:  [name]  82 y.o. 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  Tolerated the procedure adequately.    FINDINGS: - White nummular lesions in esophageal mucosa.  Biopsied.  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  Treated with argon plasma coagulation.  - Gastritis.  - Normal examined duodenum.   RECOMMENDATIONS - Return patient to hospital ward for ongoing care.  - Soft diet.  - Check H/H daily. - Follow pathology - Pantoprazole 40 mg BID PO. - Can discuss outpatient colonoscopy in GI clinic. - Patient will follow up in GI clinic  in 3-4 weeks. - GI service will sign-off, please call us back if you have any more questions.  Maylon Peppers, MD Gastroenterology and Hepatology [facility] Gastroenterology

## 2022-04-22 NOTE — TOC Transition Note (Signed)
Transition of Care [facility]) - CM/SW Discharge Note   Patient Details  Name: [name] MRN: [id number] Date of Birth: 11-14-1940  Transition of Care [facility]) CM/SW Contact:  Iona Beard, [place] Phone Number: 04/22/2022, 10:56 AM   Clinical Narrative:    CSW spoke to son to inquire about interest in SNF vs [facility] for pt. Pts son states that he stays with pt and when he leaves other family members are able to come and sit with pt. Pt has needed DME in the home. Pts son states they will take pt home and would like [facility] PT set up with [facility]. CSW spoke to [place] with [facility] who accepts [facility] PT referral. [place] orders have been placed. CSW updated Tommi Rumps that pts son is best contact for setting up appointments. TOC signing off.   Final next level of care: Home w Home Health Services Barriers to Discharge: Barriers Resolved   Patient Goals and CMS Choice CMS Medicare.gov Compare Post Acute Care list provided to:: Patient Represenative (must comment) Choice offered to / list presented to : Adult Children  Discharge Placement                         Discharge Plan and Services Additional resources added to the After Visit Summary for                            [facility] Arranged: PT [facility] Agency: [place] Date [facility] Agency Contacted: 04/22/22   Representative spoke with at [place]: [place] ([place]) Interventions SDOH Screenings   Food Insecurity: No Food Insecurity (04/20/2022)  Housing: Low Risk  (04/20/2022)  Transportation Needs: No Transportation Needs (04/20/2022)  Utilities: Not At Risk (04/20/2022)  Alcohol Screen: Low Risk  (09/11/2021)  Depression (PHQ2-9): Low Risk  (10/30/2021)  Financial Resource Strain: Low Risk  (09/11/2021)  Physical Activity: Inactive (09/11/2021)  Social Connections: Socially Isolated (09/11/2021)  Stress: No Stress Concern Present (09/11/2021)  Tobacco Use: Low Risk  (04/20/2022)     Readmission Risk Interventions     04/21/2022    2:05 PM  Readmission Risk Prevention Plan  Transportation Screening Complete  PCP or Specialist Appt within 5-7 Days Not Complete  Home Care Screening Complete  Medication Review (RN CM) Complete

## 2022-04-22 NOTE — Transfer of Care (Signed)
Immediate Anesthesia Transfer of Care Note  Patient: [name]  Procedure(s) Performed: ESOPHAGOGASTRODUODENOSCOPY (EGD) WITH PROPOFOL HOT HEMOSTASIS (ARGON PLASMA COAGULATION/BICAP) BIOPSY  Patient Location: PACU  Anesthesia Type:General  Level of Consciousness: awake and patient cooperative  Airway & Oxygen Therapy: Patient Spontanous Breathing and Patient connected to nasal cannula oxygen  Post-op Assessment: Report given to RN and Post -op Vital signs reviewed and stable  Post vital signs: Reviewed and stable  Last Vitals:  Vitals Value Taken Time  BP 148/82 04/22/22 1532  Temp 98.8 1534  Pulse 82 04/22/22 1534  Resp 16 04/22/22 1534  SpO2 100 % 04/22/22 1534  Vitals shown include unvalidated device data.  Last Pain:  Vitals:   04/22/22 1507  TempSrc:   PainSc: 0-No pain      Patients Stated Pain Goal: 0 ([id number])  Complications: No notable events documented.

## 2022-04-22 NOTE — Progress Notes (Signed)
We will proceed with EGD as scheduled.  I thoroughly discussed with the patient the procedure, including the risks involved. Patient understands what the procedure involves including the benefits and any risks. Patient understands alternatives to the proposed procedure. Risks including (but not limited to) bleeding, tearing of the lining (perforation), rupture of adjacent organs, problems with heart and lung function, infection, and medication reactions. A small percentage of complications may require surgery, hospitalization, repeat endoscopic procedure, and/or transfusion.  Patient understood and agreed.  Maylon Peppers, MD Gastroenterology and Hepatology [facility]

## 2022-04-22 NOTE — Progress Notes (Signed)
OT Cancellation Note  Patient Details Name: [name] MRN: [id number] DOB: Jan 15, 1941   Cancelled Treatment:    Reason Eval/Treat Not Completed: Other (comment). Pt had family or friends in the room that requested to hold therapy until later to let the pt rest. Will attempt to see pt later as time permits.   Analyn Matusek OT, MOT   Larey Seat 04/22/2022, 8:56 AM

## 2022-04-22 NOTE — Progress Notes (Signed)
PROGRESS NOTE    AQUA UNTHANK  [id number] DOB: 01-May-1940 DOA: 04/19/2022 PCP: [name], MD   Brief Narrative:    [name] is a 82 y.o. female with medical history significant of iron deficiency anemia, GI bleed, PE on Pradaxa, history of atrial fibrillation, depression, memory loss, hypertension, hyperlipidemia, diabetes mellitus type 2, and more --- presents the ED with a chief complaint of generalized weakness and fell  for several weeks.  It has been progressively worse since it started.  She was admitted with acute symptomatic anemia and required total 4 unit PRBC transfusion.  GI and palliative care following with plans for upper endoscopy 3/7.  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)   DNR (do not resuscitate)  Assessment and Plan:  Acute symptomatic blood loss anemia - Continue to hold Pradaxa -GI planning for upper endoscopy 3/7 -Status post 4 unit PRBC transfusion -Continue to monitor repeat CBC which demonstrates stability for now     DNR (do not resuscitate) - ACP documents reviewed, DNR on file - This is confirmed with conversation with patient at bedside   CKD stage G3b/A1, 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) - Creatinine is at baseline 1.50 - Continue to monitor   History of pulmonary embolism - Holding Pradaxa due to acute anemia   Type 2 diabetes mellitus with neurological complications ([place]) - Patient is on 70/30, 55 units at baseline - Reduced to 20 units basal insulin with sliding scale coverage   - Continue to monitor CBGs  Depression with anxiety - Continue BuSpar at lower dose given delirium and discontinued  imipramine   Dementia with mild delirium - Continue Aricept -Medication adjustments as noted above   Essential hypertension-uncontrolled - Continue Norvasc, clonidine, ARB -IV hydralazine as needed   Hyperlipidemia LDL goal <100 - Continue Lipitor  Obesity -BMI 37.08  DVT prophylaxis:SCDs Code Status: DNR Family Communication: Multiple family members at bedside 3/6 and 3/7 Disposition Plan:  Status is: Inpatient Remains inpatient appropriate because: Need for IV medications.  Consultants:  GI Palliative  Procedures:  None  Antimicrobials:  None   Subjective: Patient seen and evaluated today and appears minimally confused at baseline.  Denies any significant complaints or concerns.  Less confused than yesterday.  Objective: Vitals:   04/21/22 1357 04/21/22 1600 04/21/22 2034 04/22/22 0510  BP: (!) 158/85 (!) 163/92 (!) 164/82 (!) 153/71  Pulse: 93 94 88 75  Resp:  '20 16 18  '$ Temp: 98 F (36.7 C) 98.6 F (37 C) 98.4 F (36.9 C) 98.2 F (36.8 C)  TempSrc: Oral Oral Oral Oral  SpO2: 100% 99% 100% 100%  Weight:      Height:        Intake/Output Summary (Last 24 hours) at 04/22/2022 1013 Last data filed at 04/22/2022 0300 Gross per 24 hour  Intake 3220.16 ml  Output 300 ml  Net 2920.16 ml   Filed Weights   04/19/22 2238 04/20/22 1110 04/21/22 0500  Weight: 109.8 kg 110.2 kg 107.4 kg    Examination:  General exam: Appears minimally confused, hard of hearing, obese Respiratory system: Clear to auscultation. Respiratory effort normal.  3 L  nasal cannula Cardiovascular system: S1 & S2 heard, RRR.  Gastrointestinal system: Abdomen is soft Central nervous system: Alert and awake Extremities: No edema Skin: No significant lesions noted Psychiatry: Flat affect.    Data Reviewed: I have personally reviewed following labs and imaging studies  CBC: Recent Labs  Lab 04/19/22 2255 04/20/22 1128 04/21/22 1125 04/22/22 0435  WBC 11.7* 11.9*  --  11.2*   NEUTROABS  --  9.2*  --   --   HGB 4.3* 7.7* 10.6* 9.7*  HCT 15.2* 24.4* 33.0* 30.5*  MCV 80.0 81.3  --  81.6  PLT 374 350  --  [id number]   Basic Metabolic Panel: Recent Labs  Lab 04/19/22 2255 04/20/22 1128 04/22/22 0435  NA 137 138 136  K 4.0 4.1 3.9  CL 104 103 103  CO2 '24 22 23  '$ GLUCOSE 154* 167* 181*  BUN 24* 19 18  CREATININE 1.50* 1.31* 1.34*  CALCIUM 8.4* 8.3* 8.5*  MG  --  2.1 2.2   GFR: Estimated Creatinine Clearance: 40.8 mL/min (A) (by C-G formula based on SCr of 1.34 mg/dL (H)). Liver Function Tests: Recent Labs  Lab 04/20/22 1128  AST 15  ALT 11  ALKPHOS 99  BILITOT 0.1*  PROT 7.1  ALBUMIN 3.2*   No results for input(s): "LIPASE", "AMYLASE" in the last 168 hours. No results for input(s): "AMMONIA" in the last 168 hours. Coagulation Profile: Recent Labs  Lab 04/19/22 2255  INR 1.3*   Cardiac Enzymes: No results for input(s): "CKTOTAL", "CKMB", "CKMBINDEX", "TROPONINI" in the last 168 hours. BNP (last 3 results) No results for input(s): "PROBNP" in the last 8760 hours. HbA1C: No results for input(s): "HGBA1C" in the last 72 hours. CBG: Recent Labs  Lab 04/21/22 0732 04/21/22 1119 04/21/22 1613 04/21/22 2145 04/22/22 0741  GLUCAP 167* 182* 158* 184* 196*   Lipid Profile: No results for input(s): "CHOL", "HDL", "LDLCALC", "TRIG", "CHOLHDL", "LDLDIRECT" in the last 72 hours. Thyroid Function Tests: No results for input(s): "TSH", "T4TOTAL", "FREET4", "T3FREE", "THYROIDAB" in the last 72 hours. Anemia Panel: Recent Labs    04/20/22 1128  VITAMINB12 224  FOLATE 11.6  FERRITIN 11  TIBC 378  IRON 53  RETICCTPCT 1.6   Sepsis Labs: Recent Labs  Lab 04/19/22 2305 04/20/22 1128  LATICACIDVEN 1.9 1.4    Recent Results (from the past 240 hour(s))  Resp panel by RT-PCR (RSV, Flu A&B, Covid) Anterior Nasal Swab     Status: None   Collection Time: 04/19/22 10:43 PM   Specimen: Anterior Nasal Swab  Result Value Ref Range Status   SARS  Coronavirus 2 by RT PCR NEGATIVE NEGATIVE Final    Comment: (NOTE) SARS-CoV-2 target nucleic acids are NOT DETECTED.  The SARS-CoV-2 RNA is generally detectable in upper respiratory specimens during the acute phase of infection. The lowest concentration of SARS-CoV-2 viral copies this assay can detect is 138 copies/mL. A negative result does not preclude SARS-Cov-2 infection and should not be used as the sole basis for treatment or other patient management decisions. A negative result may occur with  improper specimen collection/handling, submission of specimen other than nasopharyngeal swab, presence of viral mutation(s) within the areas targeted by this assay, and inadequate number of viral copies(<138 copies/mL). A negative result must be combined with clinical observations, patient history, and epidemiological information. The expected result is Negative.  Fact Sheet for Patients:  EntrepreneurPulse.com.au  Fact Sheet for Healthcare Providers:  IncredibleEmployment.be  This test is no t yet approved or cleared by  the [employer] and  has been authorized for detection and/or diagnosis of SARS-CoV-2 by FDA under an Emergency Use Authorization (EUA). This EUA will remain  in effect (meaning this test can be used) for the duration of the COVID-19 declaration under Section 564(b)(1) of the Act, 21 U.S.C.section 360bbb-3(b)(1), unless the authorization is terminated  or revoked sooner.       Influenza A by PCR NEGATIVE NEGATIVE Final   Influenza B by PCR NEGATIVE NEGATIVE Final    Comment: (NOTE) The Xpert Xpress SARS-CoV-2/FLU/RSV plus assay is intended as an aid in the diagnosis of influenza from Nasopharyngeal swab specimens and should not be used as a sole basis for treatment. Nasal washings and aspirates are unacceptable for Xpert Xpress SARS-CoV-2/FLU/RSV testing.  Fact Sheet for  Patients: EntrepreneurPulse.com.au  Fact Sheet for Healthcare Providers: IncredibleEmployment.be  This test is not yet approved or cleared by the [place] FDA and has been authorized for detection and/or diagnosis of SARS-CoV-2 by FDA under an Emergency Use Authorization (EUA). This EUA will remain in effect (meaning this test can be used) for the duration of the COVID-19 declaration under Section 564(b)(1) of the Act, 21 U.S.C. section 360bbb-3(b)(1), unless the authorization is terminated or revoked.     Resp Syncytial Virus by PCR NEGATIVE NEGATIVE Final    Comment: (NOTE) Fact Sheet for Patients: EntrepreneurPulse.com.au  Fact Sheet for Healthcare Providers: IncredibleEmployment.be  This test is not yet approved or cleared by the [place] FDA and has been authorized for detection and/or diagnosis of SARS-CoV-2 by FDA under an Emergency Use Authorization (EUA). This EUA will remain in effect (meaning this test can be used) for the duration of the COVID-19 declaration under Section 564(b)(1) of the Act, 21 U.S.C. section 360bbb-3(b)(1), unless the authorization is terminated or revoked.  Performed at [facility], [address], [place], [place] [address]   Culture, blood (Routine x 2)     Status: None (Preliminary result)   Collection Time: 04/19/22 10:55 PM   Specimen: BLOOD  Result Value Ref Range Status   Specimen Description   Final    BLOOD BLOOD LEFT ARM Performed at [facility], [address], [place], [place] [address]    Special Requests   Final    BOTTLES DRAWN AEROBIC AND ANAEROBIC Blood Culture adequate volume Performed at [place] [place], [place] [address]    Culture  Setup Time   Final    GRAM POSITIVE COCCI Gram Stain Report Called to,Read Back By and Verified With: KINDLEY,C ON 04/20/22 AT 2235 BY LOY,C ANAEROBIC BOTTLE PERFORMED AT [facility] CRITICAL  RESULT CALLED TO, READ BACK BY AND VERIFIED WITH: C KINDLEY,RN'@0216'$  04/21/22 [place] Performed at [facility], [place] [address], [place], [place] [address]    Culture GRAM POSITIVE COCCI  Final   Report Status PENDING  Incomplete  Blood Culture ID Panel (Reflexed)     Status: Abnormal   Collection Time: 04/19/22 10:55 PM  Result Value Ref Range Status   Enterococcus faecalis NOT DETECTED NOT DETECTED Final   Enterococcus Faecium NOT DETECTED NOT DETECTED Final   Listeria monocytogenes NOT DETECTED NOT DETECTED Final   Staphylococcus species DETECTED (A) NOT DETECTED Final    Comment: CRITICAL RESULT CALLED TO, READ BACK BY AND VERIFIED WITH: C KINDLEY,RN'@0217'$  04/21/22 [place]    Staphylococcus aureus (BCID) NOT DETECTED NOT DETECTED Final   Staphylococcus epidermidis DETECTED (A) NOT DETECTED Final    Comment: Methicillin (oxacillin) resistant coagulase negative staphylococcus.  Possible blood culture contaminant (unless isolated from more than one blood culture draw or clinical case suggests pathogenicity). No antibiotic treatment is indicated for blood  culture contaminants. CRITICAL RESULT CALLED TO, READ BACK BY AND VERIFIED WITH: C KINDLEY,RN'@0217'$  [id number] [place]    Staphylococcus lugdunensis NOT DETECTED NOT DETECTED Final   Streptococcus species NOT DETECTED NOT DETECTED Final   Streptococcus agalactiae NOT DETECTED NOT DETECTED Final   Streptococcus pneumoniae NOT DETECTED NOT DETECTED Final   Streptococcus pyogenes NOT DETECTED NOT DETECTED Final   A.calcoaceticus-baumannii NOT DETECTED NOT DETECTED Final   Bacteroides fragilis NOT DETECTED NOT DETECTED Final   Enterobacterales NOT DETECTED NOT DETECTED Final   Enterobacter cloacae complex NOT DETECTED NOT DETECTED Final   Escherichia coli NOT DETECTED NOT DETECTED Final   Klebsiella aerogenes NOT DETECTED NOT DETECTED Final   Klebsiella oxytoca NOT DETECTED NOT DETECTED Final   Klebsiella pneumoniae NOT DETECTED NOT DETECTED Final    Proteus species NOT DETECTED NOT DETECTED Final   Salmonella species NOT DETECTED NOT DETECTED Final   Serratia marcescens NOT DETECTED NOT DETECTED Final   Haemophilus influenzae NOT DETECTED NOT DETECTED Final   Neisseria meningitidis NOT DETECTED NOT DETECTED Final   Pseudomonas aeruginosa NOT DETECTED NOT DETECTED Final   Stenotrophomonas maltophilia NOT DETECTED NOT DETECTED Final   Candida albicans NOT DETECTED NOT DETECTED Final   Candida auris NOT DETECTED NOT DETECTED Final   Candida glabrata NOT DETECTED NOT DETECTED Final   Candida krusei NOT DETECTED NOT DETECTED Final   Candida parapsilosis NOT DETECTED NOT DETECTED Final   Candida tropicalis NOT DETECTED NOT DETECTED Final   Cryptococcus neoformans/gattii NOT DETECTED NOT DETECTED Final   Methicillin resistance mecA/C DETECTED (A) NOT DETECTED Final    Comment: CRITICAL RESULT CALLED TO, READ BACK BY AND VERIFIED WITH: C KINDLEY,RN'@0217'$  04/21/22 [place] Performed at [facility], [address], [place], [place] [address]   Culture, blood (Routine x 2)     Status: None (Preliminary result)   Collection Time: 04/19/22 11:16 PM   Specimen: BLOOD  Result Value Ref Range Status   Specimen Description BLOOD BLOOD LEFT ARM  Final   Special Requests   Final    BOTTLES DRAWN AEROBIC AND ANAEROBIC Blood Culture adequate volume   Culture   Final    NO GROWTH 3 DAYS Performed at [facility], [address], [place], [place] [address]    Report Status PENDING  Incomplete  MRSA Next Gen by PCR, Nasal     Status: None   Collection Time: 04/20/22 11:00 AM   Specimen: Nasal Mucosa; Nasal Swab  Result Value Ref Range Status   MRSA by PCR Next Gen NOT DETECTED NOT DETECTED Final    Comment: (NOTE) The GeneXpert MRSA Assay (FDA approved for NASAL specimens only), is one component of a comprehensive MRSA colonization surveillance program. It is not intended to diagnose MRSA infection nor to guide or monitor treatment for MRSA  infections. Test performance is not FDA approved in patients less than 4 years old. Performed at [facility], [address], [place], [place] [address]          Radiology Studies: DG CHEST PORT 1 VIEW  Result Date: 04/20/2022 CLINICAL DATA:  Wheezing. EXAM: PORTABLE CHEST 1 VIEW COMPARISON:  04/19/2022 FINDINGS: Stable cardiac enlargement and prominent/tortuous thoracic aorta. Stable mild elevation of the left hemidiaphragm. There is no evidence of pulmonary edema, consolidation, pneumothorax or pleural fluid. IMPRESSION: Stable cardiac enlargement and prominent/tortuous thoracic aorta. No  acute findings. Electronically Signed   By: Aletta Edouard M.D.   On: 04/20/2022 16:18        Scheduled Meds:  sodium chloride   Intravenous Once   sodium chloride   Intravenous Once   amLODipine  10 mg Oral Daily   atorvastatin  40 mg Oral Daily   busPIRone  5 mg Oral BID   Chlorhexidine Gluconate Cloth  6 each Topical Daily   cloNIDine  0.2 mg Oral TID   donepezil  10 mg Oral QHS   insulin aspart  0-15 Units Subcutaneous TID WC   insulin aspart  0-5 Units Subcutaneous QHS   insulin detemir  20 Units Subcutaneous QHS   irbesartan  150 mg Oral Daily   pantoprazole (PROTONIX) IV  40 mg Intravenous Q12H     LOS: 2 days    Time spent: 35 minutes    Amreen Raczkowski Darleen Crocker, DO [facility]  If 7PM-7AM, please contact night-coverage [email or web address] 04/22/2022, 10:13 AM

## 2022-04-22 NOTE — Anesthesia Procedure Notes (Signed)
Date/Time: 04/22/2022 3:07 PM  Performed by: Vista Deck, CRNAPre-anesthesia Checklist: Patient identified, Emergency Drugs available, Suction available, Timeout performed and Patient being monitored Patient Re-evaluated:Patient Re-evaluated prior to induction Oxygen Delivery Method: Nasal Cannula

## 2022-04-22 NOTE — Progress Notes (Signed)
Daily Progress Note   Patient Name: [name]       Date: 04/22/2022 DOB: 06/02/1940  Age: 82 y.o. MRN#: [id number] Attending Physician: Rodena Goldmann, DO Primary Care Physician: [name], MD Admit Date: 04/19/2022 Length of Stay: 2 days  Reason for Consultation/Follow-up: Establishing goals of care  HPI/Patient Profile:  82 y.o. female  with past medical history of iron deficiency anemia, GI bleed, PE on Pradaxa, history of atrial fibrillation, depression, memory loss, hypertension, hyperlipidemia, diabetes mellitus type 2, and more who presented to the ED with a chief complaint of generalized weakness and fell  for several weeks.  She was admitted on 04/19/2022 with acute symptomatic blood loss anemia, CKD stage IIIb, history of pulmonary embolism, and others.    PMT was consulted for [place] conversations.  Subjective:   Subjective: Chart Reviewed. Updates received. Patient Assessed. Created space and opportunity for patient  and family to explore thoughts and feelings regarding current medical situation.  Today's Discussion: Today I saw the patient at bedside.  Her sister was present who I previously spoke with over the phone.  Also present was another family member who was sleeping in the window seat.  We shared discussion about her dog Trixie whom her son brought to visit her yesterday.  She seemed very happy talking about her dog.  She has not seen any more melena or hematochezia.  Her family did note that there was dark/black stools at home, which is consistent with her admitting diagnosis and lab work.  We discussed the plan for upper endoscopy today to evaluate for upper GI bleed.  If nothing convincing found, they may opt to do a colonoscopy tomorrow as well.  She is asking what time they will come to get her today and I told her that it is difficult to tell as an inpatient add-on.  We are hopeful for this morning.  The only pain she has is a mild headache which she  attributes to caffeine withdrawal because she cannot have coffee this morning.  Otherwise denies pain, nausea, vomiting.  I told her that today is my last day for the week but if she is still here on Monday I will come back to check on her.  I provided emotional and general support through therapeutic listening, empathy, sharing of stories, and other techniques. I answered all questions and addressed all concerns to the best of my ability.  Review of Systems  Constitutional:  Positive for fatigue.       Mild headache  Respiratory:  Negative for cough and shortness of breath.   Cardiovascular:  Negative for chest pain.  Gastrointestinal:  Negative for abdominal pain, nausea and vomiting.    Objective:   Vital Signs:  BP (!) 153/71 (BP Location: Right Wrist)   Pulse 75   Temp 98.2 F (36.8 C) (Oral)   Resp 18   Ht '5\' 7"'$  (1.702 m)   Wt 107.4 kg   SpO2 100%   BMI 37.08 kg/m   Physical Exam: Physical Exam Vitals and nursing note reviewed.  Constitutional:      General: She is sleeping. She is not in acute distress.    Appearance: She is ill-appearing.  HENT:     Head: Normocephalic and atraumatic.  Cardiovascular:     Rate and Rhythm: Normal rate.  Pulmonary:     Effort: Pulmonary effort is normal. No respiratory distress.     Breath sounds: No wheezing or rhonchi.  Abdominal:  General: Abdomen is flat.     Palpations: Abdomen is soft.     Tenderness: There is no abdominal tenderness.  Skin:    General: Skin is warm and dry.  Neurological:     General: No focal deficit present.     Mental Status: She is easily aroused.  Psychiatric:        Mood and Affect: Mood normal.        Behavior: Behavior normal.     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)  Depression with anxiety  DNR (do not resuscitate)  Essential hypertension  Hyperlipidemia LDL goal <100  Memory loss  Type 2 diabetes mellitus with neurological complications ([place])  History of pulmonary embolism  SUMMARY OF RECOMMENDATIONS   Remain DNR MOST form updated yesterday and uploaded to Vynca/ACP [place] document completed yesterday in [place] per chaplain services Appreciate GI assistance, will await endoscopic results Ongoing discussions regarding anticoagulant after GI workup PMT will follow-up 04/26/2022 if patient still admitted  Symptom Management:  Per primary team PMT is available to assist as needed  Code Status: DNR  Prognosis: Unable to determine  Discharge Planning: To Be Determined  Discussed with: Patient, family, medical team, nursing team  Thank you for allowing [place] to participate in the care of [name] PMT will continue to support holistically.  Billing based on MDM: Moderate  Walden Field, NP Palliative Medicine Team  Team Phone # [phone] (Nights/Weekends)  10/14/2020, 8:17 AM

## 2022-04-22 NOTE — Op Note (Addendum)
[facility] Patient Name: [name] Procedure Date: 04/22/2022 2:51 PM MRN: [id number] Date of Birth: 1940-10-06 Attending MD: Maylon Peppers , , [id number] CSN: [id number] Age: 82 Admit Type: Inpatient Procedure:                Upper GI endoscopy Indications:              Iron deficiency anemia Providers:                Maylon Peppers, Rosina Lowenstein, RN, Ladoris Gene                            Technician, Technician Referring MD:              Medicines:                Monitored Anesthesia Care Complications:            No immediate complications. Estimated Blood Loss:     Estimated blood loss: none. Procedure:                Pre-Anesthesia Assessment:                           - Prior to the procedure, a History and Physical                            was performed, and patient medications, allergies                            and sensitivities were reviewed. The patient's                            tolerance of previous anesthesia was reviewed.                           - The risks and benefits of the procedure and the                            sedation options and risks were discussed with the                            patient. All questions were answered and informed                            consent was obtained.                           - ASA Grade Assessment: III - A patient with severe                            systemic disease.                           After obtaining informed consent, the endoscope was                            passed under direct vision. Throughout the  procedure, the patient's blood pressure, pulse, and                            oxygen saturations were monitored continuously. The                            [id number]-[id number] scope was introduced through the                            mouth, and advanced to the third part of duodenum.                            The upper GI endoscopy was accomplished without                             difficulty. The patient tolerated the procedure                            well. Scope In: 3:16:44 PM Scope Out: 3:26:14 PM Total Procedure Duration: 0 hours 9 minutes 30 seconds  Findings:      White nummular lesions were noted in the lower third of the esophagus.       Biopsies were taken with a cold forceps for histology.      A single 4 mm angiodysplastic lesion with no bleeding was found in the       gastric body. Coagulation for bleeding prevention using argon plasma at       0.3 liters/minute and 20 watts was successful.      Localized moderate inflammation characterized by congestion (edema) and       erythema was found in the gastric antrum.      The examined duodenum was normal. Impression:               - White nummular lesions in esophageal mucosa.                            Biopsied.                           - A single non-bleeding angiodysplastic lesion in                            the stomach. Treated with argon plasma coagulation.                           - Gastritis.                           - Normal examined duodenum. Moderate Sedation:      Per Anesthesia Care Recommendation:           - Return patient to hospital ward for ongoing care.                           - Soft diet.                           -  Check H/H daily.                           - Pantoprazole 40 mg BID PO.                           - Can discuss outpatient colonoscopy in GI clinic.                           - Await pathology results. Procedure Code(s):        --- Professional ---                           [phone], 75, Esophagogastroduodenoscopy, flexible,                            transoral; with control of bleeding, any method                           43239, Esophagogastroduodenoscopy, flexible,                            transoral; with biopsy, single or multiple Diagnosis Code(s):        --- Professional ---                           K22.89, Other specified disease of  esophagus                           K31.819, Angiodysplasia of stomach and duodenum                            without bleeding                           K29.70, Gastritis, unspecified, without bleeding                           D50.9, Iron deficiency anemia, unspecified CPT copyright 2022 [employer]. All rights reserved. The codes documented in this report are preliminary and upon coder review may  be revised to meet current compliance requirements. Maylon Peppers, MD Maylon Peppers,  04/22/2022 3:37:54 PM This report has been signed electronically. Number of Addenda: 0

## 2022-04-22 NOTE — Anesthesia Preprocedure Evaluation (Signed)
Anesthesia Evaluation  Patient identified by MRN, date of birth, ID band Patient awake    Reviewed: Allergy & Precautions, H&P , NPO status , Patient's Chart, lab work & pertinent test results, reviewed documented beta blocker date and time   Airway Mallampati: II  TM Distance: >3 FB Neck ROM: full    Dental no notable dental hx. (+) Missing   Pulmonary pneumonia   Pulmonary exam normal breath sounds clear to auscultation       Cardiovascular Exercise Tolerance: Good hypertension, negative cardio ROS  Rhythm:regular Rate:Normal     Neuro/Psych  Headaches PSYCHIATRIC DISORDERS Anxiety Depression   Dementia  Neuromuscular disease    GI/Hepatic Neg liver ROS,GERD  Medicated,,  Endo/Other  diabetes  Morbid obesity  Renal/GU CRFRenal disease  negative genitourinary   Musculoskeletal   Abdominal   Peds  Hematology  (+) Blood dyscrasia, anemia   Anesthesia Other Findings   Reproductive/Obstetrics negative OB ROS                             Anesthesia Physical Anesthesia Plan  ASA: 4 and emergent  Anesthesia Plan: General   Post-op Pain Management:    Induction:   PONV Risk Score and Plan: Propofol infusion  Airway Management Planned:   Additional Equipment:   Intra-op Plan:   Post-operative Plan:   Informed Consent: I have reviewed the patients History and Physical, chart, labs and discussed the procedure including the risks, benefits and alternatives for the proposed anesthesia with the patient or authorized representative who has indicated his/her understanding and acceptance.     Dental Advisory Given  Plan Discussed with: CRNA  Anesthesia Plan Comments:        Anesthesia Quick Evaluation

## 2022-04-22 NOTE — Plan of Care (Signed)

## 2022-04-23 DIAGNOSIS — D649 Anemia, unspecified: Secondary | ICD-10-CM | POA: Diagnosis not present

## 2022-04-23 LAB — CBC
HCT: 30.5 % — ABNORMAL LOW (ref 36.0–46.0)
Hemoglobin: 9.7 g/dL — ABNORMAL LOW (ref 12.0–15.0)
MCH: 26.1 pg (ref 26.0–34.0)
MCHC: 31.8 g/dL (ref 30.0–36.0)
MCV: 82 fL (ref 80.0–100.0)
Platelets: 324 10*3/uL (ref 150–400)
RBC: 3.72 MIL/uL — ABNORMAL LOW (ref 3.87–5.11)
RDW: 16.6 % — ABNORMAL HIGH (ref 11.5–15.5)
WBC: 11.6 10*3/uL — ABNORMAL HIGH (ref 4.0–10.5)
nRBC: 0 % (ref 0.0–0.2)

## 2022-04-23 LAB — BASIC METABOLIC PANEL
Anion gap: 8 (ref 5–15)
BUN: 18 mg/dL (ref 8–23)
CO2: 23 mmol/L (ref 22–32)
Calcium: 8.4 mg/dL — ABNORMAL LOW (ref 8.9–10.3)
Chloride: 103 mmol/L (ref 98–111)
Creatinine, Ser: 1.38 mg/dL — ABNORMAL HIGH (ref 0.44–1.00)
GFR, Estimated: 38 mL/min — ABNORMAL LOW (ref >60)
Glucose, Bld: 167 mg/dL — ABNORMAL HIGH (ref 70–99)
Potassium: 3.8 mmol/L (ref 3.5–5.1)
Sodium: 134 mmol/L — ABNORMAL LOW (ref 135–145)

## 2022-04-23 LAB — CULTURE, BLOOD (ROUTINE X 2): Special Requests: ADEQUATE

## 2022-04-23 LAB — GLUCOSE, CAPILLARY: Glucose-Capillary: 178 mg/dL — ABNORMAL HIGH (ref 70–99)

## 2022-04-23 LAB — MAGNESIUM: Magnesium: 2.2 mg/dL (ref 1.7–2.4)

## 2022-04-23 MED ORDER — PANTOPRAZOLE SODIUM 40 MG PO TBEC: 40.0000 mg | DELAYED_RELEASE_TABLET | Freq: Two times a day (BID) | ORAL | 0 refills | Status: DC

## 2022-04-23 MED ORDER — DOCUSATE SODIUM 100 MG PO CAPS: 100.0000 mg | ORAL_CAPSULE | Freq: Two times a day (BID) | ORAL | 2 refills | Status: DC

## 2022-04-23 MED ORDER — FERROUS SULFATE 325 (65 FE) MG PO TBEC: 325.0000 mg | DELAYED_RELEASE_TABLET | Freq: Two times a day (BID) | ORAL | 3 refills | Status: AC

## 2022-04-23 MED ORDER — CLONIDINE HCL 0.2 MG PO TABS: 0.2000 mg | ORAL_TABLET | Freq: Three times a day (TID) | ORAL | 0 refills | Status: DC

## 2022-04-23 NOTE — Anesthesia Postprocedure Evaluation (Signed)
Anesthesia Post Note  Patient: REGHAN HANIFAN  Procedure(s) Performed: ESOPHAGOGASTRODUODENOSCOPY (EGD) WITH PROPOFOL HOT HEMOSTASIS (ARGON PLASMA COAGULATION/BICAP) BIOPSY  Patient location during evaluation: Phase II Anesthesia Type: General Level of consciousness: awake Pain management: pain level controlled Vital Signs Assessment: post-procedure vital signs reviewed and stable Respiratory status: spontaneous breathing and respiratory function stable Cardiovascular status: blood pressure returned to baseline and stable Postop Assessment: no headache and no apparent nausea or vomiting Anesthetic complications: no Comments: Late entry   No notable events documented.   Last Vitals:  Vitals:   04/22/22 2059 04/23/22 0530  BP: (!) 153/78 (!) 146/65  Pulse: 76 70  Resp: 20 20  Temp: 36.8 C 36.4 C  SpO2: 100% 100%    Last Pain:  Vitals:   04/23/22 0916  TempSrc:   PainSc: [place]

## 2022-04-23 NOTE — Discharge Summary (Addendum)
Physician Discharge Summary  [name] [id number] DOB: 1940/03/31 DOA: 04/19/2022  PCP: [name], MD  Admit date: 04/19/2022  Discharge date: 04/23/2022  Admitted From: Home  Disposition: Home  Recommendations for Outpatient Follow-up:  Follow up with PCP in 1-2 weeks Please obtain BMP/CBC in one week Follow-up with GI Dr. Jenetta Downer as recommended in 3-4 weeks and consider colonoscopy outpatient Continue on home Pradaxa as well as other prior home medications PPI twice daily  Home Health: Yes with PT/OT  Equipment/Devices: None  Discharge Condition:Stable  CODE STATUS: DNR  Diet recommendation: Heart Healthy/Carb Modified  Brief/Interim Summary:  [name] is a 82 y.o. female with medical history significant of iron deficiency anemia, GI bleed, PE on Pradaxa, history of atrial fibrillation, depression, memory loss, hypertension, hyperlipidemia, diabetes mellitus type 2, and more --- presents the ED with a chief complaint of generalized weakness and fell  for several weeks.  It has been progressively worse since it started.  She was admitted with acute symptomatic anemia and required total 4 unit PRBC transfusion.  GI and palliative care following and she underwent upper endoscopy 3/7 with findings of nummular lesions in the esophageal mucosa that were biopsied and a single nonbleeding angiodysplastic lesion in her stomach that was treated with APC.  She was recommended to resume Pradaxa per GI and remain on PPI twice daily.  Hemoglobin levels have remained stable with no further overt bleeding noted at this time.  PT had recommended SNF placement, however family members would like to take patient home with home health services and this has been arranged.   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  Possible Candida esophagitis with results pending.  Discharge Instructions  Discharge Instructions     Diet - low sodium heart healthy   Complete by: As directed    Increase activity slowly   Complete by: As directed       Allergies as of 04/23/2022       Reactions   Penicillins Itching   Did it involve swelling of the face/tongue/throat, SOB, or low BP? No Did it involve sudden or severe rash/hives, skin peeling, or any reaction on the inside of your mouth or nose? No Did you need to seek medical attention at a hospital or doctor's office? No When did it last happen?  many years ago    If all above answers are "NO", may proceed with cephalosporin use.   Fentanyl Itching   Sulfonamide Derivatives Hives   Sulfur    Codeine Itching, Rash   tussionex  Is tolerated by patient, no phenergan dm         Medication List     STOP taking these medications    omeprazole 40 MG capsule Commonly known as: PRILOSEC       TAKE these medications    Accu-Chek Aviva Plus test strip Generic drug: glucose blood Use as instructed three times daily dx e11.65   acetaminophen 500 MG tablet Commonly known as:  TYLENOL Take one tablet two times daily for chronic pain What changed:  how much to take how to take this when to take this additional instructions   albuterol 108 (90 Base) MCG/ACT inhaler Commonly known as: Ventolin HFA Inhale 2 puffs into the lungs every 6 (six) hours as needed for wheezing or shortness of breath.   amLODipine 10 MG tablet Commonly known as: NORVASC Take 1 tablet (10 mg total) by mouth daily.   atorvastatin 40  MG tablet Commonly known as: LIPITOR TAKE 1 TABLET BY MOUTH ONCE DAILY.   azelastine 0.05 % ophthalmic solution Commonly known as: OPTIVAR Place 1 drop into the right eye 2 (two) times daily.   busPIRone 7.5 MG tablet Commonly known as: BUSPAR Take 1 tablet (7.5 mg total) by mouth 3 (three) times daily.   cloNIDine 0.2 MG tablet Commonly known as: CATAPRES Take 1 tablet (0.2 mg total) by mouth 3 (three) times daily. What changed: when to take this   clotrimazole-betamethasone cream Commonly known as: Lotrisone Apply 1 application topically 2 (two) times daily. D/c vusion ointment   clotrimazole-betamethasone cream Commonly known as: LOTRISONE Apply twice daily to affected areas for 1 week , then as needed   Combigan 0.2-0.5 % ophthalmic solution Generic drug: brimonidine-timolol Place 1 drop into both eyes 2 (two) times daily.   dabigatran 150 MG Caps capsule Commonly known as: PRADAXA TAKE ONE CAPSULE BY MOUTH EVERY TWELVE HOURS   docusate sodium 100 MG capsule Commonly known as: COLACE Take 1 capsule (100 mg total) by mouth daily as needed for mild constipation. What changed: when to take this   docusate sodium 100 MG capsule Commonly known as: Colace Take 1 capsule (100 mg total) by mouth 2 (two) times daily. What changed: You were already taking a medication with the same name, and this prescription was added. Make sure you understand how and when to take each.   donepezil 10 MG tablet Commonly known as: ARICEPT TAKE ONE TABLET BY MOUTH AT BEDTIME.   ergocalciferol 1.25 MG (50000 UT) capsule Commonly known as: VITAMIN D2 Take 1 capsule (50,000 Units total) by mouth once a week. One capsule once weekly   ferrous sulfate 325 (65 FE) MG EC tablet Take 1 tablet (325 mg total) by mouth 2 (two) times daily.   hydrOXYzine 25 MG capsule Commonly known as: VISTARIL TAKE (1) CAPSULE BY MOUTH THREE TIMES DAILY   imipramine 50 MG tablet Commonly known as:  TOFRANIL TAKE 2 TABLETS BY MOUTH AT BEDTIME   Insulin Syringe-Needle U-100 32G X 5/16" 1 ML Misc 1 each by Does not apply route in the morning and at bedtime. Use to inject insulin twice daily dx e11.65   BD Insulin Syringe U/F 31G X 5/16" 1 ML Misc Generic drug: Insulin Syringe-Needle U-100 USE AS DIRECTED   meclizine 25 MG tablet Commonly known as: ANTIVERT Take 1 tablet (25 mg total) by mouth 3 (three) times daily as needed for dizziness.   montelukast 10 MG tablet Commonly known as: SINGULAIR Take 1 tablet (10 mg total) by mouth at bedtime.   NovoLIN 70/30 (70-30) 100 UNIT/ML injection Generic drug: insulin NPH-regular Human INJECT 55 UNITS INTO THE SKIN TWICE DAILY WITH A MEAL   nystatin powder Commonly known as: MYCOSTATIN/NYSTOP APPLY TOPCIALLY TO RASH UNDER BREASTS ONCE DAILY AS NEEDED.   olmesartan 20 MG tablet Commonly known as: BENICAR Take 1 tablet (20 mg total) by mouth daily.   olopatadine 0.1 % ophthalmic solution Commonly known as: PATANOL  Place 1 drop into both eyes 2 (two) times daily.   oxyCODONE-acetaminophen 10-325 MG tablet Commonly known as: PERCOCET Take one tablet by mouth two times daily for chronic pain Start taking on: May 23, 2022 What changed: Another medication with the same name was removed. Continue taking this medication, and follow the directions you see here.   pantoprazole 40 MG tablet Commonly known as: Protonix Take 1 tablet (40 mg total) by mouth 2 (two) times daily.   SUMAtriptan 25 MG tablet Commonly known as: IMITREX TAKE 1 TABLET BY MOUTH AS NEEDED FOR MIGRAINE. MAY REPEAT IN 2 HOURS IF HEADACHE PERSISTS OR RECURS.   topiramate 100 MG tablet Commonly known as: TOPAMAX TAKE 1 TABLET BY MOUTH TWICE DAILY   UltiCare Mini Pen Needles 31G X 6 MM Misc Generic drug: Insulin Pen Needle USE TO INJECT NOVOLIN TWICE DAILY.   UNABLE TO FIND Standard wheelchair with elevating leg rests DX M25.561, M54.41   UNABLE TO  FIND Skilled Nursing to eval and treat as indicated. Will need routine lab work and vital sign assessments.   UNABLE TO [facility] to evaluate and treat as indicated. Dx: Z45.2   UNABLE TO FIND Med Name: toxassure 8 dx: PAIN MANAGEMENT CONTRACT Z08.89        Follow-up Information     [name], MD. Schedule an appointment as soon as possible for a visit in 1 week(s).   Specialty: Family Medicine Contact information: [address], [place] [address] [phone]         Montez Morita, Quillian Quince, MD. Go to.   Specialty: Gastroenterology Contact information: [address] [place] [address] [phone]                Allergies  Allergen Reactions   Penicillins Itching    Did it involve swelling of the face/tongue/throat, SOB, or low BP? No Did it involve sudden or severe rash/hives, skin peeling, or any reaction on the inside of your mouth or nose? No Did you need to seek medical attention at a hospital or doctor's office? No When did it last happen?  many years ago    If all above answers are "NO", may proceed with cephalosporin use.     Fentanyl Itching   Sulfonamide Derivatives Hives   Sulfur    Codeine Itching and Rash    tussionex  Is tolerated by patient, no phenergan dm     Consultations: GI Palliative care   Procedures/Studies: DG CHEST PORT 1 VIEW  Result Date: 04/20/2022 CLINICAL DATA:  Wheezing. EXAM: PORTABLE CHEST 1 VIEW COMPARISON:  04/19/2022 FINDINGS: Stable cardiac enlargement and prominent/tortuous thoracic aorta. Stable mild elevation of the left hemidiaphragm. There is no evidence of pulmonary edema, consolidation, pneumothorax or pleural fluid. IMPRESSION: Stable cardiac enlargement and prominent/tortuous thoracic aorta. No acute findings. Electronically Signed   By: Aletta Edouard M.D.   On: 04/20/2022 16:18   DG Chest Port 1 View  Result Date: 04/19/2022 CLINICAL DATA:  Weakness.  EXAM: PORTABLE CHEST 1 VIEW COMPARISON:  Chest radiograph dated 11/10/2018 and CT dated 11/10/2018 FINDINGS: The patient is rotated. No focal consolidation, pleural effusion, pneumothorax. Mild cardiomegaly. There is widened appearance of mediastinum, likely related to rotation. Clinical correlation is recommended. No acute osseous pathology. IMPRESSION: 1. No acute cardiopulmonary process. 2. Mild cardiomegaly. 3. Widened appearance of mediastinum, likely related to patient's rotation. Electronically Signed   By: Anner Crete M.D.   On: 04/19/2022 23:16  Discharge Exam: Vitals:   04/22/22 2059 04/23/22 0530  BP: (!) 153/78 (!) 146/65  Pulse: 76 70  Resp: 20 20  Temp: 98.3 F (36.8 C) 97.6 F (36.4 C)  SpO2: 100% 100%   Vitals:   04/22/22 1535 04/22/22 1600 04/22/22 2059 04/23/22 0530  BP: (!) 148/82 (!) 148/75 (!) 153/78 (!) 146/65  Pulse: 82 73 76 70  Resp: '16 18 20 20  '$ Temp: 98.8 F (37.1 C) 98.5 F (36.9 C) 98.3 F (36.8 C) 97.6 F (36.4 C)  TempSrc:  Oral    SpO2: 100% 100% 100% 100%  Weight:      Height:        General: Pt is alert, awake, not in acute distress Cardiovascular: RRR, S1/S2 +, no rubs, no gallops Respiratory: CTA bilaterally, no wheezing, no rhonchi Abdominal: Soft, NT, ND, bowel sounds + Extremities: no edema, no cyanosis    The results of significant diagnostics from this hospitalization (including imaging, microbiology, ancillary and laboratory) are listed below for reference.     Microbiology: Recent Results (from the past 240 hour(s))  Resp panel by RT-PCR (RSV, Flu A&B, Covid) Anterior Nasal Swab     Status: None   Collection Time: 04/19/22 10:43 PM   Specimen: Anterior Nasal Swab  Result Value Ref Range Status   SARS Coronavirus 2 by RT PCR NEGATIVE NEGATIVE Final    Comment: (NOTE) SARS-CoV-2 target nucleic acids are NOT DETECTED.  The SARS-CoV-2 RNA is generally detectable in upper respiratory specimens during the acute phase of  infection. The lowest concentration of SARS-CoV-2 viral copies this assay can detect is 138 copies/mL. A negative result does not preclude SARS-Cov-2 infection and should not be used as the sole basis for treatment or other patient management decisions. A negative result may occur with  improper specimen collection/handling, submission of specimen other than nasopharyngeal swab, presence of viral mutation(s) within the areas targeted by this assay, and inadequate number of viral copies(<138 copies/mL). A negative result must be combined with clinical observations, patient history, and epidemiological information. The expected result is Negative.  Fact Sheet for Patients:  EntrepreneurPulse.com.au  Fact Sheet for Healthcare Providers:  IncredibleEmployment.be  This test is no t yet approved or cleared by the [place] FDA and  has been authorized for detection and/or diagnosis of SARS-CoV-2 by FDA under an Emergency Use Authorization (EUA). This EUA will remain  in effect (meaning this test can be used) for the duration of the COVID-19 declaration under Section 564(b)(1) of the Act, 21 U.S.C.section 360bbb-3(b)(1), unless the authorization is terminated  or revoked sooner.       Influenza A by PCR NEGATIVE NEGATIVE Final   Influenza B by PCR NEGATIVE NEGATIVE Final    Comment: (NOTE) The Xpert Xpress SARS-CoV-2/FLU/RSV plus assay is intended as an aid in the diagnosis of influenza from Nasopharyngeal swab specimens and should not be used as a sole basis for treatment. Nasal washings and aspirates are unacceptable for Xpert Xpress SARS-CoV-2/FLU/RSV testing.  Fact Sheet for Patients: EntrepreneurPulse.com.au  Fact Sheet for Healthcare Providers: IncredibleEmployment.be  This test is not yet approved or cleared by the [place] FDA and has been authorized for detection and/or diagnosis of SARS-CoV-2  by FDA under an Emergency Use Authorization (EUA). This EUA will remain in effect (meaning this test can be used) for the duration of the COVID-19 declaration under Section 564(b)(1) of the Act, 21 U.S.C. section 360bbb-3(b)(1), unless the authorization is terminated or revoked.     Resp  Syncytial Virus by PCR NEGATIVE NEGATIVE Final    Comment: (NOTE) Fact Sheet for Patients: EntrepreneurPulse.com.au  Fact Sheet for Healthcare Providers: IncredibleEmployment.be  This test is not yet approved or cleared by the [place] FDA and has been authorized for detection and/or diagnosis of SARS-CoV-2 by FDA under an Emergency Use Authorization (EUA). This EUA will remain in effect (meaning this test can be used) for the duration of the COVID-19 declaration under Section 564(b)(1) of the Act, 21 U.S.C. section 360bbb-3(b)(1), unless the authorization is terminated or revoked.  Performed at [facility], [address], [place], [place] [address]   Culture, blood (Routine x 2)     Status: Abnormal   Collection Time: 04/19/22 10:55 PM   Specimen: BLOOD  Result Value Ref Range Status   Specimen Description   Final    BLOOD BLOOD LEFT ARM Performed at [facility], [address], [place], [place] [address]    Special Requests   Final    BOTTLES DRAWN AEROBIC AND ANAEROBIC Blood Culture adequate volume Performed at [address] [place], [place] [address]    Culture  Setup Time   Final    GRAM POSITIVE COCCI Gram Stain Report Called to,Read Back By and Verified With: KINDLEY,C ON 04/20/22 AT 2235 BY LOY,C ANAEROBIC BOTTLE PERFORMED AT [facility] CRITICAL RESULT CALLED TO, READ BACK BY AND VERIFIED WITH: C KINDLEY,RN'@0216'$  04/21/22 [place]    Culture (A)  Final    STAPHYLOCOCCUS EPIDERMIDIS THE SIGNIFICANCE OF ISOLATING THIS ORGANISM FROM A SINGLE SET OF BLOOD CULTURES WHEN MULTIPLE SETS ARE DRAWN IS UNCERTAIN. PLEASE NOTIFY THE MICROBIOLOGY  DEPARTMENT WITHIN ONE WEEK IF SPECIATION AND SENSITIVITIES ARE REQUIRED. Performed at [facility], [place] [address], [place], [place] [address]    Report Status 04/23/2022 FINAL  Final  Blood Culture ID Panel (Reflexed)     Status: Abnormal   Collection Time: 04/19/22 10:55 PM  Result Value Ref Range Status   Enterococcus faecalis NOT DETECTED NOT DETECTED Final   Enterococcus Faecium NOT DETECTED NOT DETECTED Final   Listeria monocytogenes NOT DETECTED NOT DETECTED Final   Staphylococcus species DETECTED (A) NOT DETECTED Final    Comment: CRITICAL RESULT CALLED TO, READ BACK BY AND VERIFIED WITH: C KINDLEY,RN'@0217'$  04/21/22 [place]    Staphylococcus aureus (BCID) NOT DETECTED NOT DETECTED Final   Staphylococcus epidermidis DETECTED (A) NOT DETECTED Final    Comment: Methicillin (oxacillin) resistant coagulase negative staphylococcus. Possible blood culture contaminant (unless isolated from more than one blood culture draw or clinical case suggests pathogenicity). No antibiotic treatment is indicated for blood  culture contaminants. CRITICAL RESULT CALLED TO, READ BACK BY AND VERIFIED WITH: C KINDLEY,RN'@0217'$  04/21/22 [place]    Staphylococcus lugdunensis NOT DETECTED NOT DETECTED Final   Streptococcus species NOT DETECTED NOT DETECTED Final   Streptococcus agalactiae NOT DETECTED NOT DETECTED Final   Streptococcus pneumoniae NOT DETECTED NOT DETECTED Final   Streptococcus pyogenes NOT DETECTED NOT DETECTED Final   A.calcoaceticus-baumannii NOT DETECTED NOT DETECTED Final   Bacteroides fragilis NOT DETECTED NOT DETECTED Final   Enterobacterales NOT DETECTED NOT DETECTED Final   Enterobacter cloacae complex NOT DETECTED NOT DETECTED Final   Escherichia coli NOT DETECTED NOT DETECTED Final   Klebsiella aerogenes NOT DETECTED NOT DETECTED Final   Klebsiella oxytoca NOT DETECTED NOT DETECTED Final   Klebsiella pneumoniae NOT DETECTED NOT DETECTED Final   Proteus species NOT DETECTED NOT  DETECTED Final   Salmonella species NOT DETECTED NOT DETECTED Final  Serratia marcescens NOT DETECTED NOT DETECTED Final   Haemophilus influenzae NOT DETECTED NOT DETECTED Final   Neisseria meningitidis NOT DETECTED NOT DETECTED Final   Pseudomonas aeruginosa NOT DETECTED NOT DETECTED Final   Stenotrophomonas maltophilia NOT DETECTED NOT DETECTED Final   Candida albicans NOT DETECTED NOT DETECTED Final   Candida auris NOT DETECTED NOT DETECTED Final   Candida glabrata NOT DETECTED NOT DETECTED Final   Candida krusei NOT DETECTED NOT DETECTED Final   Candida parapsilosis NOT DETECTED NOT DETECTED Final   Candida tropicalis NOT DETECTED NOT DETECTED Final   Cryptococcus neoformans/gattii NOT DETECTED NOT DETECTED Final   Methicillin resistance mecA/C DETECTED (A) NOT DETECTED Final    Comment: CRITICAL RESULT CALLED TO, READ BACK BY AND VERIFIED WITH: C KINDLEY,RN'@0217'$  04/21/22 [place] Performed at [facility], [address] [place], [place] [address]   Culture, blood (Routine x 2)     Status: None (Preliminary result)   Collection Time: 04/19/22 11:16 PM   Specimen: BLOOD  Result Value Ref Range Status   Specimen Description BLOOD BLOOD LEFT ARM  Final   Special Requests   Final    BOTTLES DRAWN AEROBIC AND ANAEROBIC Blood Culture adequate volume   Culture   Final    NO GROWTH 3 DAYS Performed at [facility], [address], [place], [place] [address]    Report Status PENDING  Incomplete  MRSA Next Gen by PCR, Nasal     Status: None   Collection Time: 04/20/22 11:00 AM   Specimen: Nasal Mucosa; Nasal Swab  Result Value Ref Range Status   MRSA by PCR Next Gen NOT DETECTED NOT DETECTED Final    Comment: (NOTE) The GeneXpert MRSA Assay (FDA approved for NASAL specimens only), is one component of a comprehensive MRSA colonization surveillance program. It is not intended to diagnose MRSA infection nor to guide or monitor treatment for MRSA infections. Test performance is not  FDA approved in patients less than 68 years old. Performed at [facility], [address] [place], [place] [address]      Labs: BNP (last 3 results) No results for input(s): "BNP" in the last 8760 hours. Basic Metabolic Panel: Recent Labs  Lab 04/19/22 2255 04/20/22 1128 04/22/22 0435 04/23/22 0503  NA 137 138 136 134*  K 4.0 4.1 3.9 3.8  CL 104 103 103 103  CO2 '24 22 23 23  '$ GLUCOSE 154* 167* 181* 167*  BUN 24* '19 18 18  '$ CREATININE 1.50* 1.31* 1.34* 1.38*  CALCIUM 8.4* 8.3* 8.5* 8.4*  MG  --  2.1 2.2 2.2   Liver Function Tests: Recent Labs  Lab 04/20/22 1128  AST 15  ALT 11  ALKPHOS 99  BILITOT 0.1*  PROT 7.1  ALBUMIN 3.2*   No results for input(s): "LIPASE", "AMYLASE" in the last 168 hours. No results for input(s): "AMMONIA" in the last 168 hours. CBC: Recent Labs  Lab 04/19/22 2255 04/20/22 1128 04/21/22 1125 04/22/22 0435 04/23/22 0503  WBC 11.7* 11.9*  --  11.2* 11.6*  NEUTROABS  --  9.2*  --   --   --   HGB 4.3* 7.7* 10.6* 9.7* 9.7*  HCT 15.2* 24.4* 33.0* 30.5* 30.5*  MCV 80.0 81.3  --  81.6 82.0  PLT 374 350  --  347 324   Cardiac Enzymes: No results for input(s): "CKTOTAL", "CKMB", "CKMBINDEX", "TROPONINI" in the last 168 hours. BNP: Invalid input(s): "POCBNP" CBG: Recent Labs  Lab 04/22/22 1127 04/22/22 1347 04/22/22 1546 04/22/22 2109  04/23/22 0734  GLUCAP 144* 148* 136* 235* 178*   D-Dimer No results for input(s): "DDIMER" in the last 72 hours. Hgb A1c No results for input(s): "HGBA1C" in the last 72 hours. Lipid Profile No results for input(s): "CHOL", "HDL", "LDLCALC", "TRIG", "CHOLHDL", "LDLDIRECT" in the last 72 hours. Thyroid function studies No results for input(s): "TSH", "T4TOTAL", "T3FREE", "THYROIDAB" in the last 72 hours.  Invalid input(s): "FREET3" Anemia work up No results for input(s): "VITAMINB12", "FOLATE", "FERRITIN", "TIBC", "IRON", "RETICCTPCT" in the last 72 hours.  Urinalysis    Component Value Date/Time    COLORURINE YELLOW 04/20/2022 0058   APPEARANCEUR HAZY (A) 04/20/2022 0058   APPEARANCEUR Clear 03/30/2022 1034   LABSPEC 1.014 04/20/2022 0058   PHURINE 6.0 04/20/2022 0058   GLUCOSEU NEGATIVE 04/20/2022 0058   HGBUR NEGATIVE 04/20/2022 0058   HGBUR negative 01/06/2010 0929   BILIRUBINUR NEGATIVE 04/20/2022 0058   BILIRUBINUR Negative 03/30/2022 1034   KETONESUR NEGATIVE 04/20/2022 0058   PROTEINUR 30 (A) 04/20/2022 0058   UROBILINOGEN >=8.0 (A) 11/17/2016 1438   UROBILINOGEN 0.2 10/12/2011 1556   NITRITE NEGATIVE 04/20/2022 0058   LEUKOCYTESUR SMALL (A) 04/20/2022 0058   Sepsis Labs Recent Labs  Lab 04/19/22 2255 04/20/22 1128 04/22/22 0435 04/23/22 0503  WBC 11.7* 11.9* 11.2* 11.6*   Microbiology Recent Results (from the past 240 hour(s))  Resp panel by RT-PCR (RSV, Flu A&B, Covid) Anterior Nasal Swab     Status: None   Collection Time: 04/19/22 10:43 PM   Specimen: Anterior Nasal Swab  Result Value Ref Range Status   SARS Coronavirus 2 by RT PCR NEGATIVE NEGATIVE Final    Comment: (NOTE) SARS-CoV-2 target nucleic acids are NOT DETECTED.  The SARS-CoV-2 RNA is generally detectable in upper respiratory specimens during the acute phase of infection. The lowest concentration of SARS-CoV-2 viral copies this assay can detect is 138 copies/mL. A negative result does not preclude SARS-Cov-2 infection and should not be used as the sole basis for treatment or other patient management decisions. A negative result may occur with  improper specimen collection/handling, submission of specimen other than nasopharyngeal swab, presence of viral mutation(s) within the areas targeted by this assay, and inadequate number of viral copies(<138 copies/mL). A negative result must be combined with clinical observations, patient history, and epidemiological information. The expected result is Negative.  Fact Sheet for Patients:  EntrepreneurPulse.com.au  Fact Sheet  for Healthcare Providers:  IncredibleEmployment.be  This test is no t yet approved or cleared by the [place] FDA and  has been authorized for detection and/or diagnosis of SARS-CoV-2 by FDA under an Emergency Use Authorization (EUA). This EUA will remain  in effect (meaning this test can be used) for the duration of the COVID-19 declaration under Section 564(b)(1) of the Act, 21 U.S.C.section 360bbb-3(b)(1), unless the authorization is terminated  or revoked sooner.       Influenza A by PCR NEGATIVE NEGATIVE Final   Influenza B by PCR NEGATIVE NEGATIVE Final    Comment: (NOTE) The Xpert Xpress SARS-CoV-2/FLU/RSV plus assay is intended as an aid in the diagnosis of influenza from Nasopharyngeal swab specimens and should not be used as a sole basis for treatment. Nasal washings and aspirates are unacceptable for Xpert Xpress SARS-CoV-2/FLU/RSV testing.  Fact Sheet for Patients: EntrepreneurPulse.com.au  Fact Sheet for Healthcare Providers: IncredibleEmployment.be  This test is not yet approved or cleared by the [place] FDA and has been authorized for detection and/or diagnosis of SARS-CoV-2 by FDA under an Emergency Use Authorization (  EUA). This EUA will remain in effect (meaning this test can be used) for the duration of the COVID-19 declaration under Section 564(b)(1) of the Act, 21 U.S.C. section 360bbb-3(b)(1), unless the authorization is terminated or revoked.     Resp Syncytial Virus by PCR NEGATIVE NEGATIVE Final    Comment: (NOTE) Fact Sheet for Patients: EntrepreneurPulse.com.au  Fact Sheet for Healthcare Providers: IncredibleEmployment.be  This test is not yet approved or cleared by the [place] FDA and has been authorized for detection and/or diagnosis of SARS-CoV-2 by FDA under an Emergency Use Authorization (EUA). This EUA will remain in effect (meaning  this test can be used) for the duration of the COVID-19 declaration under Section 564(b)(1) of the Act, 21 U.S.C. section 360bbb-3(b)(1), unless the authorization is terminated or revoked.  Performed at [facility], [address], [place], [place] [address]   Culture, blood (Routine x 2)     Status: Abnormal   Collection Time: 04/19/22 10:55 PM   Specimen: BLOOD  Result Value Ref Range Status   Specimen Description   Final    BLOOD BLOOD LEFT ARM Performed at [facility], [address], [place], [place] [address]    Special Requests   Final    BOTTLES DRAWN AEROBIC AND ANAEROBIC Blood Culture adequate volume Performed at [address] [place], [place] [address]    Culture  Setup Time   Final    GRAM POSITIVE COCCI Gram Stain Report Called to,Read Back By and Verified With: KINDLEY,C ON 04/20/22 AT 2235 BY LOY,C ANAEROBIC BOTTLE PERFORMED AT [facility] CRITICAL RESULT CALLED TO, READ BACK BY AND VERIFIED WITH: C KINDLEY,RN'@0216'$  04/21/22 [place]    Culture (A)  Final    STAPHYLOCOCCUS EPIDERMIDIS THE SIGNIFICANCE OF ISOLATING THIS ORGANISM FROM A SINGLE SET OF BLOOD CULTURES WHEN MULTIPLE SETS ARE DRAWN IS UNCERTAIN. PLEASE NOTIFY THE MICROBIOLOGY DEPARTMENT WITHIN ONE WEEK IF SPECIATION AND SENSITIVITIES ARE REQUIRED. Performed at [facility], [place] [address], [place],  [address]    Report Status 04/23/2022 FINAL  Final  Blood Culture ID Panel (Reflexed)     Status: Abnormal   Collection Time: 04/19/22 10:55 PM  Result Value Ref Range Status   Enterococcus faecalis NOT DETECTED NOT DETECTED Final   Enterococcus Faecium NOT DETECTED NOT DETECTED Final   Listeria monocytogenes NOT DETECTED NOT DETECTED Final   Staphylococcus species DETECTED (A) NOT DETECTED Final    Comment: CRITICAL RESULT CALLED TO, READ BACK BY AND VERIFIED WITH: C KINDLEY,RN'@0217'$  04/21/22 [place]    Staphylococcus aureus (BCID) NOT DETECTED NOT DETECTED Final   Staphylococcus epidermidis  DETECTED (A) NOT DETECTED Final    Comment: Methicillin (oxacillin) resistant coagulase negative staphylococcus. Possible blood culture contaminant (unless isolated from more than one blood culture draw or clinical case suggests pathogenicity). No antibiotic treatment is indicated for blood  culture contaminants. CRITICAL RESULT CALLED TO, READ BACK BY AND VERIFIED WITH: C KINDLEY,RN'@0217'$  04/21/22 [place]    Staphylococcus lugdunensis NOT DETECTED NOT DETECTED Final   Streptococcus species NOT DETECTED NOT DETECTED Final   Streptococcus agalactiae NOT DETECTED NOT DETECTED Final   Streptococcus pneumoniae NOT DETECTED NOT DETECTED Final   Streptococcus pyogenes NOT DETECTED NOT DETECTED Final   A.calcoaceticus-baumannii NOT DETECTED NOT DETECTED Final   Bacteroides fragilis NOT DETECTED NOT DETECTED Final   Enterobacterales NOT DETECTED NOT DETECTED Final   Enterobacter cloacae complex NOT DETECTED NOT DETECTED Final   Escherichia coli NOT DETECTED NOT DETECTED Final   Klebsiella aerogenes NOT  DETECTED NOT DETECTED Final   Klebsiella oxytoca NOT DETECTED NOT DETECTED Final   Klebsiella pneumoniae NOT DETECTED NOT DETECTED Final   Proteus species NOT DETECTED NOT DETECTED Final   Salmonella species NOT DETECTED NOT DETECTED Final   Serratia marcescens NOT DETECTED NOT DETECTED Final   Haemophilus influenzae NOT DETECTED NOT DETECTED Final   Neisseria meningitidis NOT DETECTED NOT DETECTED Final   Pseudomonas aeruginosa NOT DETECTED NOT DETECTED Final   Stenotrophomonas maltophilia NOT DETECTED NOT DETECTED Final   Candida albicans NOT DETECTED NOT DETECTED Final   Candida auris NOT DETECTED NOT DETECTED Final   Candida glabrata NOT DETECTED NOT DETECTED Final   Candida krusei NOT DETECTED NOT DETECTED Final   Candida parapsilosis NOT DETECTED NOT DETECTED Final   Candida tropicalis NOT DETECTED NOT DETECTED Final   Cryptococcus neoformans/gattii NOT DETECTED NOT DETECTED Final    Methicillin resistance mecA/C DETECTED (A) NOT DETECTED Final    Comment: CRITICAL RESULT CALLED TO, READ BACK BY AND VERIFIED WITH: C KINDLEY,RN'@0217'$  04/21/22 [place] Performed at [facility], [address] [place], [place] [address]   Culture, blood (Routine x 2)     Status: None (Preliminary result)   Collection Time: 04/19/22 11:16 PM   Specimen: BLOOD  Result Value Ref Range Status   Specimen Description BLOOD BLOOD LEFT ARM  Final   Special Requests   Final    BOTTLES DRAWN AEROBIC AND ANAEROBIC Blood Culture adequate volume   Culture   Final    NO GROWTH 3 DAYS Performed at [facility] [address] [place], [place] [address]    Report Status PENDING  Incomplete  MRSA Next Gen by PCR, Nasal     Status: None   Collection Time: 04/20/22 11:00 AM   Specimen: Nasal Mucosa; Nasal Swab  Result Value Ref Range Status   MRSA by PCR Next Gen NOT DETECTED NOT DETECTED Final    Comment: (NOTE) The GeneXpert MRSA Assay (FDA approved for NASAL specimens only), is one component of a comprehensive MRSA colonization surveillance program. It is not intended to diagnose MRSA infection nor to guide or monitor treatment for MRSA infections. Test performance is not FDA approved in patients less than 19 years old. Performed at [facility], [address], [place], [place] [address]      Time coordinating discharge: 35 minutes  SIGNED:   Rodena Goldmann, DO [facility] 04/23/2022, 11:44 AM  If 7PM-7AM, please contact night-coverage [email or web address]

## 2022-04-23 NOTE — Plan of Care (Signed)
  Problem: Education: Goal: Knowledge of General Education information will improve Description: Including pain rating scale, medication(s)/side effects and non-pharmacologic comfort measures 04/23/2022 0237 by Zadie Rhine, RN Outcome: Progressing 04/22/2022 2356 by Zadie Rhine, RN Outcome: Progressing   Problem: Health Behavior/Discharge Planning: Goal: Ability to manage health-related needs will improve 04/23/2022 0237 by Zadie Rhine, RN Outcome: Progressing 04/22/2022 2356 by Zadie Rhine, RN Outcome: Progressing   Problem: Clinical Measurements: Goal: Ability to maintain clinical measurements within normal limits will improve 04/23/2022 0237 by Zadie Rhine, RN Outcome: Progressing 04/22/2022 2356 by Zadie Rhine, RN Outcome: Progressing Goal: Will remain free from infection 04/23/2022 0237 by Zadie Rhine, RN Outcome: Progressing 04/22/2022 2356 by Zadie Rhine, RN Outcome: Progressing Goal: Diagnostic test results will improve 04/23/2022 0237 by Zadie Rhine, RN Outcome: Progressing 04/22/2022 2356 by Zadie Rhine, RN Outcome: Progressing

## 2022-04-23 NOTE — Care Management Important Message (Signed)
Important Message  Patient Details  Name: [name] MRN: [id number] Date of Birth: 1940/05/06   Medicare Important Message Given:  Yes (late entry, copy delivered)     Tommy Medal 04/23/2022, 12:03 PM

## 2022-04-24 LAB — CULTURE, BLOOD (ROUTINE X 2)
Culture: NO GROWTH
Special Requests: ADEQUATE

## 2022-04-26 ENCOUNTER — Telehealth: Payer: Self-pay

## 2022-04-26 ENCOUNTER — Other Ambulatory Visit (INDEPENDENT_AMBULATORY_CARE_PROVIDER_SITE_OTHER): Payer: Self-pay | Admitting: Gastroenterology

## 2022-04-26 DIAGNOSIS — B3781 Candidal esophagitis: Secondary | ICD-10-CM

## 2022-04-26 LAB — SURGICAL PATHOLOGY

## 2022-04-26 MED ORDER — CLOTRIMAZOLE 10 MG MT TROC: 10.0000 mg | Freq: Every day | OROMUCOSAL | 0 refills | Status: AC

## 2022-04-26 NOTE — Transitions of Care (Post Inpatient/ED Visit) (Signed)
   04/26/2022  Name: [name] MRN: [id number] DOB: 1940/04/04  Today's TOC FU Call Status: Today's TOC FU Call Status:: Successful TOC FU Call Competed TOC FU Call Complete Date: 04/26/22  Transition Care Management Follow-up Telephone Call Date of Discharge: 04/23/22 [facility]charge [facility] Type of Discharge: Inpatient Admission Primary Inpatient Discharge Diagnosis:: Symptomatic Anemia How have you been since you were released from the hospital?: Better (Per son [name], patient is doing better today) Any questions or concerns?: No  Items Reviewed: Did you receive and understand the discharge instructions provided?: Yes Medications obtained and verified?: Yes (Medications Reviewed) Any new allergies since your discharge?: No Dietary orders reviewed?: Yes Type of Diet Ordered:: Heart Healthy Do you have support at home?: Yes People in Home: child(ren), adult Name of Support/Comfort Primary Source: Son [name] is primary caregiver Home Care and Equipment/Supplies: [place] Ordered?: Yes Name of [place]:: [facility] Has Agency set up a time to come to your home?: No EMR reviewed for Home Health Orders: Orders present/patient has not received call (refer to CM for follow-up) Any new equipment or medical supplies ordered?: No Functional Questionnaire: Do you need assistance with bathing/showering or dressing?: Yes Do you need assistance with meal preparation?: Yes Do you need assistance with eating?: No Do you have difficulty maintaining continence: No Do you need assistance with getting out of bed/getting out of a chair/moving?: Yes Do you have difficulty managing or taking your medications?: Yes Folllow up appointments reviewed: PCP Follow-up appointment confirmed?: Yes Date of PCP follow-up appointment?: 04/27/22 Follow-up Provider: Juanda Chance NP [facility] Follow-up appointment confirmed?: NA Do you need transportation  to your follow-up appointment?: No Do you understand care options if your condition(s) worsen?: Yes-patient verbalized understanding  SDOH Interventions Today    Flowsheet Row Most Recent Value  SDOH Interventions   Food Insecurity Interventions Intervention Not Indicated  Housing Interventions Intervention Not Indicated      Johnney Killian, RN, BSN, CCM Care Management Coordinator [facility] Phone: [phone]: [phone]

## 2022-04-27 ENCOUNTER — Ambulatory Visit (INDEPENDENT_AMBULATORY_CARE_PROVIDER_SITE_OTHER): Payer: Medicare HMO | Admitting: Family Medicine

## 2022-04-27 ENCOUNTER — Encounter: Payer: Self-pay | Admitting: Family Medicine

## 2022-04-27 VITALS — BP 152/82 | HR 85 | Ht 66.0 in | Wt 242.0 lb

## 2022-04-27 DIAGNOSIS — D649 Anemia, unspecified: Secondary | ICD-10-CM

## 2022-04-27 DIAGNOSIS — K5900 Constipation, unspecified: Secondary | ICD-10-CM | POA: Insufficient documentation

## 2022-04-27 MED ORDER — GLYCERIN (ADULT) 2 G RE SUPP: 1.0000 | RECTAL | 0 refills | Status: DC | PRN

## 2022-04-27 NOTE — Patient Instructions (Signed)
It was pleasure meeting with you today. Please take medications as prescribed. Follow up with your primary health provider if any health concerns arises. If symptoms worsen please contact your primary care provider and/or visit the emergency department.  

## 2022-04-27 NOTE — Progress Notes (Deleted)
   Acute Office Visit  Subjective:    Patient ID: [name], female    DOB: 1940-06-05, 82 y.o.   MRN: [id number]  Chief Complaint  Patient presents with   Follow-up    Ed visit from 04/19/22 , d/c on 04/23/22, pt reports feeling constipated. Has been using stool softerns.     Patient is in today for *** HPI   Review of Systems  Constitutional:  Negative for chills and fever.  Respiratory:  Negative for cough.   Cardiovascular:  Negative for chest pain.  Genitourinary:  Negative for dysuria.  Neurological:  Negative for dizziness.      Objective:    BP (!) 152/82   Pulse 85   Ht 5\' 6"  (1.676 m)   Wt 242 lb (109.8 kg)   SpO2 97%   BMI 39.06 kg/m  {Vitals History (Optional):23777}  Physical Exam  No results found for any visits on 04/27/22.     Assessment & Plan:  There are no diagnoses linked to this encounter.  No follow-ups on file.  Renard Hamper Ria Comment, FNP

## 2022-04-27 NOTE — Assessment & Plan Note (Signed)
ER Follow up , Labs ordered BMP, CBC Hemoglobin 9.7 on 3/8

## 2022-04-27 NOTE — Assessment & Plan Note (Addendum)
Advise on glycerin suppository if no bowel movement in 24 hours encouraged to follow up with PCP.

## 2022-04-27 NOTE — Progress Notes (Signed)
Patient Office Visit   Subjective   Patient ID: [name], female    DOB: 1940-11-23  Age: 82 y.o. MRN: [id number]  CC:  Chief Complaint  Patient presents with   Follow-up    Ed visit from 04/19/22 , d/c on 04/23/22, pt reports feeling constipated. Has been using stool softerns.     HPI [name] 82 year female, presents to clinic for ER follow up , patient discharged on 04/23/22. She  has a past medical history of Anemia, iron deficiency (2012), Anxiety, Atrial fibrillation ([place]), Bruises easily, Chronic anticoagulation (07/21/2010), Chronic back pain, Degenerative joint disease, Dementia ([place]), Depression, Diabetes mellitus, Gastroesophageal reflux disease, Glaucoma, Glaucoma, History of blood transfusion (1969), History of gout, colonic polyps, migraines, Hyperlipidemia, Hypertension, Insomnia, Joint pain, Joint swelling, Memory loss, Migraine, Migraine, Nocturia, Overweight(278.02), Pneumonia (1969), Pulmonary embolism [facility]) (May 2012), Skin spots-aging, and Urinary frequency.  Patient was seen in ER for symptomatic anemia with a Hemoglobin of 4.3 She was treated with 4 units PRBC transfusion. 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 Patient is being followed by GI on 05/24/22. She reports feeling a little better and now dealing with constipation, last bowel movement on Friday 3/8 and currently taking colace 100 mg twice daily.   Outpatient Encounter Medications as of 04/27/2022  Medication Sig   acetaminophen (TYLENOL) 500 MG tablet Take one tablet two times daily for chronic pain (Patient taking differently: Take 500 mg by mouth in the morning and at bedtime. for chronic pain)   albuterol (VENTOLIN HFA) 108 (90 Base) MCG/ACT inhaler Inhale 2 puffs into the lungs every 6 (six) hours as needed for wheezing  or shortness of breath.   amLODipine (NORVASC) 10 MG tablet Take 1 tablet (10 mg total) by mouth daily.   atorvastatin (LIPITOR) 40 MG tablet TAKE 1 TABLET BY MOUTH ONCE DAILY.   azelastine (OPTIVAR) 0.05 % ophthalmic solution Place 1 drop into the right eye 2 (two) times daily.   BD INSULIN SYRINGE U/F 31G X 5/16" 1 ML MISC USE AS DIRECTED   busPIRone (BUSPAR) 7.5 MG tablet Take 1 tablet (7.5 mg total) by mouth 3 (three) times daily.   cloNIDine (CATAPRES) 0.2 MG tablet Take 1 tablet (0.2 mg total) by mouth 3 (three) times daily.   clotrimazole (MYCELEX) 10 MG troche Take 1 tablet (10 mg total) by mouth 5 (five) times daily for 21 days.   clotrimazole-betamethasone (LOTRISONE) cream Apply 1 application topically 2 (two) times daily. D/c vusion ointment   clotrimazole-betamethasone (LOTRISONE) cream Apply twice daily to affected areas for 1 week , then as needed   COMBIGAN 0.2-0.5 % ophthalmic solution Place 1 drop into both eyes 2 (two) times daily.   dabigatran (PRADAXA) 150 MG CAPS capsule TAKE ONE CAPSULE BY MOUTH EVERY TWELVE HOURS   docusate sodium (COLACE) 100 MG capsule Take 1 capsule (100 mg total) by mouth daily as needed for mild constipation. (Patient taking differently: Take 100 mg by mouth daily.)   docusate sodium (COLACE) 100 MG capsule Take 1 capsule (100 mg total) by mouth 2 (two) times daily.   donepezil (ARICEPT) 10 MG tablet TAKE ONE TABLET BY MOUTH AT BEDTIME.   ergocalciferol (VITAMIN D2) 1.25 MG (50000 UT) capsule Take 1 capsule (50,000 Units total) by mouth once a week. One capsule once weekly   ferrous  sulfate 325 (65 FE) MG EC tablet Take 1 tablet (325 mg total) by mouth 2 (two) times daily.   glucose blood (ACCU-CHEK AVIVA PLUS) test strip Use as instructed three times daily dx e11.65   glycerin adult 2 g suppository Place 1 suppository rectally as needed for constipation.   hydrOXYzine (VISTARIL) 25 MG capsule TAKE (1) CAPSULE BY MOUTH THREE TIMES DAILY   imipramine  (TOFRANIL) 50 MG tablet TAKE 2 TABLETS BY MOUTH AT BEDTIME   insulin NPH-regular Human (NOVOLIN 70/30) (70-30) 100 UNIT/ML injection INJECT 55 UNITS INTO THE SKIN TWICE DAILY WITH A MEAL   Insulin Pen Needle (ULTICARE MINI PEN NEEDLES) 31G X 6 MM MISC USE TO INJECT NOVOLIN TWICE DAILY.   Insulin Syringe-Needle U-100 32G X 5/16" 1 ML MISC 1 each by Does not apply route in the morning and at bedtime. Use to inject insulin twice daily dx e11.65   meclizine (ANTIVERT) 25 MG tablet Take 1 tablet (25 mg total) by mouth 3 (three) times daily as needed for dizziness.   montelukast (SINGULAIR) 10 MG tablet Take 1 tablet (10 mg total) by mouth at bedtime.   nystatin (MYCOSTATIN/NYSTOP) powder APPLY TOPCIALLY TO RASH UNDER BREASTS ONCE DAILY AS NEEDED.   olmesartan (BENICAR) 20 MG tablet Take 1 tablet (20 mg total) by mouth daily.   olopatadine (PATANOL) 0.1 % ophthalmic solution Place 1 drop into both eyes 2 (two) times daily.   [START ON 05/23/2022] oxyCODONE-acetaminophen (PERCOCET) 10-325 MG tablet Take one tablet by mouth two times daily for chronic pain   pantoprazole (PROTONIX) 40 MG tablet Take 1 tablet (40 mg total) by mouth 2 (two) times daily.   SUMAtriptan (IMITREX) 25 MG tablet TAKE 1 TABLET BY MOUTH AS NEEDED FOR MIGRAINE. MAY REPEAT IN 2 HOURS IF HEADACHE PERSISTS OR RECURS.   topiramate (TOPAMAX) 100 MG tablet TAKE 1 TABLET BY MOUTH TWICE DAILY   UNABLE TO FIND Standard wheelchair with elevating leg rests DX M25.561, M54.41   UNABLE TO FIND Skilled Nursing to eval and treat as indicated. Will need routine lab work and vital sign assessments.   UNABLE TO [place] to evaluate and treat as indicated. Dx: Z73.2   UNABLE TO FIND Med Name: toxassure 8 dx: PAIN MANAGEMENT CONTRACT Z08.89   Facility-Administered Encounter Medications as of 04/27/2022  Medication   fentaNYL (SUBLIMAZE) injection 25-50 mcg    Past Surgical History:  Procedure Laterality Date   BIOPSY  04/22/2019   Procedure:  BIOPSY;  Surgeon: Daneil Dolin, MD;  Location: [facility];  Service: Endoscopy;;  duodenum   CATARACT EXTRACTION W/PHACO Right 04/11/2012   Procedure: CATARACT EXTRACTION PHACO AND INTRAOCULAR LENS PLACEMENT ([place]);  Surgeon: Elta Guadeloupe T. Gershon Crane, MD;  Location: [facility];  Service: Ophthalmology;  Laterality: Right;  CDE=9.61   CATARACT EXTRACTION W/PHACO Left 12/26/2012   Procedure: CATARACT EXTRACTION PHACO AND INTRAOCULAR LENS PLACEMENT ([facility]);  Surgeon: Elta Guadeloupe T. Gershon Crane, MD;  Location: [facility];  Service: Ophthalmology;  Laterality: Left;  CDE:7.74   COLONOSCOPY  Jan 2002; 2012   2002: Dr. Tamala Julian, ext. hemorrhoids, nl colon; 2012-adenomatous polyps, gastritis on EGD   COLONOSCOPY N/A 04/24/2019   Procedure: COLONOSCOPY;  Surgeon: Rogene Houston, MD;  Location: [facility];  Service: Endoscopy;  Laterality: N/A;   CYSTOSCOPY W/ URETERAL STENT PLACEMENT Bilateral 02/25/2018   Procedure: CYSTOSCOPY WITH BILATERAL RETROGRADE PYELOGRAM/BILATERAL URETERAL STENT PLACEMENT, BLADDER BIOPSIES WITH FULGERATION;  Surgeon: [name], MD;  Location: [facility];  Service: Urology;  Laterality: Bilateral;  CYSTOSCOPY/URETEROSCOPY/HOLMIUM LASER/STENT PLACEMENT Bilateral 03/21/2018   Procedure: CYSTOSCOPY/URETEROSCOPY/HOLMIUM LASER/STENT PLACEMENT;  Surgeon: [name], MD;  Location: [facility];  Service: Urology;  Laterality: Bilateral;  ONLY NEEDS 60 MIN   DILATION AND CURETTAGE OF UTERUS     ESOPHAGOGASTRODUODENOSCOPY  08/24/2011   ESOPHAGOGASTRODUODENOSCOPY; esophageal dilatation; Rogene Houston, MD;   ESOPHAGOGASTRODUODENOSCOPY N/A 04/22/2019   Procedure: ESOPHAGOGASTRODUODENOSCOPY (EGD);  Surgeon: Daneil Dolin, MD;  [facility];  Service: Endoscopy;  Laterality: N/A;   GIVENS CAPSULE STUDY  08/18/2011   Procedure: GIVENS CAPSULE STUDY;  Surgeon: Rogene Houston, MD;  [facility];  Service: Endoscopy;  Laterality: N/A;  730   KNEE ARTHROSCOPY W/ MENISCECTOMY  90's    Left   MALONEY DILATION  04/22/2019   Procedure: MALONEY DILATION;  Surgeon: Daneil Dolin, MD;  [facility];  Service: Endoscopy;;   ORIF ANKLE FRACTURE Right 90's   TONSILLECTOMY     TOTAL KNEE ARTHROPLASTY  10/20/2011   Procedure: TOTAL KNEE ARTHROPLASTY;  Surgeon: Ninetta Lights, MD;  Location: [place];  Service: Orthopedics;  Laterality: Left;   UPPER GASTROINTESTINAL ENDOSCOPY     URETHRAL DILATION      Review of Systems  Constitutional:  Positive for malaise/fatigue. Negative for chills and fever.  Respiratory:  Negative for shortness of breath.   Cardiovascular:  Negative for chest pain.  Gastrointestinal:  Positive for constipation. Negative for abdominal pain, blood in stool, diarrhea, heartburn, melena, nausea and vomiting.  Neurological:  Negative for dizziness and headaches.      Objective    BP (!) 152/82   Pulse 85   Ht '5\' 6"'$  (1.676 m)   Wt 242 lb (109.8 kg)   SpO2 97%   BMI 39.06 kg/m   Physical Exam Constitutional:      General: She is not in acute distress.    Appearance: Normal appearance.  Cardiovascular:     Rate and Rhythm: Normal rate and regular rhythm.     Pulses: Normal pulses.     Heart sounds: Normal heart sounds.  Pulmonary:     Effort: Pulmonary effort is normal.     Breath sounds: Normal breath sounds.  Skin:    General: Skin is warm and dry.     Capillary Refill: Capillary refill takes less than 2 seconds.  Neurological:     General: No focal deficit present.  Psychiatric:        Behavior: Behavior normal.       Assessment & Plan:  Symptomatic anemia Assessment & Plan: ER Follow up , Labs ordered BMP, CBC Hemoglobin 9.7 on 3/8  Orders: -     Basic metabolic panel -     CBC  Constipation, unspecified constipation type Assessment & Plan: Advise on glycerin suppository if no bowel movement in 24 hours encouraged to follow up with PCP.  Orders: -     Glycerin (Adult); Place 1 suppository rectally as needed for  constipation.  Dispense: 12 suppository; Refill: 0    No follow-ups on file.   Renard Hamper Ria Comment, FNP

## 2022-04-28 LAB — BASIC METABOLIC PANEL
BUN/Creatinine Ratio: 10 — ABNORMAL LOW (ref 12–28)
BUN: 16 mg/dL (ref 8–27)
CO2: 20 mmol/L (ref 20–29)
Calcium: 9 mg/dL (ref 8.7–10.3)
Chloride: 102 mmol/L (ref 96–106)
Creatinine, Ser: 1.63 mg/dL — ABNORMAL HIGH (ref 0.57–1.00)
Glucose: 158 mg/dL — ABNORMAL HIGH (ref 70–99)
Potassium: 5.1 mmol/L (ref 3.5–5.2)
Sodium: 138 mmol/L (ref 134–144)
eGFR: 31 mL/min/{1.73_m2} — ABNORMAL LOW (ref >59)

## 2022-04-29 ENCOUNTER — Telehealth: Payer: Self-pay | Admitting: Family Medicine

## 2022-04-29 NOTE — Telephone Encounter (Signed)
[name] called asking about her results. The lab lady told me that she was only able to get enough blood from her to do the BMP and she wasn't able to get anymore blood for the cbc. [name] said they were about to call EMS bc all she was doing is sleeping and saying how tired she was but later in the am she perked up so they didn't. She is wanting you to review her labs and see if her kidneys are ok or if she needs to be back in the hospital

## 2022-04-29 NOTE — Telephone Encounter (Signed)
Pt sister called asking if Velna Hatchet could please give her a call back in regards to the patient

## 2022-04-30 NOTE — Telephone Encounter (Signed)
[name] aware

## 2022-05-04 ENCOUNTER — Encounter (HOSPITAL_COMMUNITY): Payer: Self-pay | Admitting: Gastroenterology

## 2022-05-08 ENCOUNTER — Other Ambulatory Visit: Payer: Self-pay | Admitting: Family Medicine

## 2022-05-13 ENCOUNTER — Other Ambulatory Visit: Payer: Self-pay | Admitting: Family Medicine

## 2022-05-14 ENCOUNTER — Other Ambulatory Visit: Payer: Self-pay | Admitting: Family Medicine

## 2022-05-24 ENCOUNTER — Ambulatory Visit (INDEPENDENT_AMBULATORY_CARE_PROVIDER_SITE_OTHER): Payer: Medicare HMO | Admitting: Gastroenterology

## 2022-05-27 ENCOUNTER — Ambulatory Visit (INDEPENDENT_AMBULATORY_CARE_PROVIDER_SITE_OTHER): Payer: Medicare HMO | Admitting: Gastroenterology

## 2022-05-27 VITALS — BP 138/82 | HR 85 | Temp 98.3°F

## 2022-05-27 DIAGNOSIS — B3781 Candidal esophagitis: Secondary | ICD-10-CM | POA: Diagnosis not present

## 2022-05-27 DIAGNOSIS — D500 Iron deficiency anemia secondary to blood loss (chronic): Secondary | ICD-10-CM

## 2022-05-27 MED ORDER — PANTOPRAZOLE SODIUM 40 MG PO TBEC: 40.0000 mg | DELAYED_RELEASE_TABLET | Freq: Two times a day (BID) | ORAL | 3 refills | Status: DC

## 2022-05-27 NOTE — Progress Notes (Addendum)
Referring Provider: Kerri Perches, MD Primary Care Physician:  Kerri Perches, MD Primary GI Physician: Levon Hedger   Chief Complaint  Patient presents with   Anemia    Patient arrives with sister and niece. Follow up hospital visit on anemia. Has not seen any blood in stool. Having fatigue and sob but reports no more than her usual. No dizziness. Taking ferrous sulfate 325 mg twice a day.    HPI:   [name] is a 82 y.o. female with past medical history of atrial fibrillation, pulmonary embolism, chronically anticoagulated on Pradaxa, type 2 diabetes, HTN, HLD, renal insufficiency, GERD, colon polyps, iron deficiency anemia   Patient presenting today for hospital follow up of hospital admission for anemia.  Recent hospital admission on 3/5  when she presented with weakness and no appetite x1.5 weeks as well as SOB. Also with some dysphagia, Previously on iron but not at the time of admission hemoglobin 4.3, B12 low normal at 224. She received 4 units of PRBCs. Folate normal. Iron low normal with ferritin 11, iron 53, saturation 14%. Patient underwent EGD as below, with angiodysplastic lesions, gastritis, candida esophagitis, started on clotrimazole x21 days and started on protonix 40mg  BID, recommended to continue PO iron, discuss colonoscopy as outpatient.   Last hgb on 3/7 was 9.7   Present:  Patient states she is feeling better. She feels that dysphagia is resolved with completion of clotrimazole for candida esophagitis. Denies abdominal pain. She has some SOB at times though is maintained on supplemental O2 at night and as needed, has SOB at baseline with exertion. She does endorse some dizziness at times as well. She denies rectal bleeding or melena,her sister who is present reports no signs of blood in stools. Appetite is good, family feels that she eats pretty well. She is taking her iron daily and Protonix 40mg  BID. Family states that they tried to have blood drawn at  PCP f/u appt but lab was unable to obtain enough blood for her cbc.  Patient's family is concerned and does not think that patient will be cooperative to drink prep for colonoscopy.    Last Colonoscopy:March 2021 with medium sized lipoma in the mid transverse colon, diverticulosis in sigmoid colon, external hemorrhoids.  Last Endoscopy:march 2024 - White nummular lesions in esophageal mucosa.  Biopsied. 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 Treated with argon plasma coagulation. - Gastritis. - Normal examined duodenum. Prior Givens capsule in July 2013 with fresh blood noted post bulbar duodenum.  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   Recommendations:    Past Medical History:  Diagnosis Date   Anemia, iron deficiency 2012   Evaluated by Dr. Darrick Penna; H&H of 9.3/30.8 with MCV-79 in 10/2010; 3/3 positive Hemoccult cards in 07/2011   Anxiety    was on Prozac for a couple of weeks   Atrial fibrillation (HCC)    takes Coumadin daily   Bruises easily    takes Coumadin   Chronic anticoagulation 07/21/2010   Chronic back pain    related to knee pain   Degenerative joint disease    Knees   Dementia (HCC)    Depression    Diabetes mellitus    takes Metformin daily   Gastroesophageal reflux disease    takes Omeprazole daily   Glaucoma    Glaucoma    uses eye drops at night   History of blood transfusion 1969   History of  gout    was on medication but taken off of several months ago   Hx of colonic polyps    Hx of migraines    last one about a yr ago;takes Topamax daily   Hyperlipidemia    takes Pravastatin daily   Hypertension    takes Hyzaar daily and Propranolol    Insomnia    takes Restoril prn   Joint pain    Joint swelling    Memory loss    takes donepezil   Migraine    Migraine    Nocturia    Overweight(278.02)    Pneumonia 1969   hx of   Pulmonary embolism [facility] May 2012   acute  presentation, bilateral PE   Skin spots-aging    Urinary frequency     Past Surgical History:  Procedure Laterality Date   BIOPSY  04/22/2019   Procedure: BIOPSY;  Surgeon: Corbin Ade, MD;  [facility];  Service: Endoscopy;;  duodenum   BIOPSY  04/22/2022   Procedure: BIOPSY;  Surgeon: Dolores Frame, MD;  [facility];  Service: Gastroenterology;;   CATARACT EXTRACTION W/PHACO Right 04/11/2012   Procedure: CATARACT EXTRACTION PHACO AND INTRAOCULAR LENS PLACEMENT (IOC);  Surgeon: Loraine Leriche T. Nile Riggs, MD;  Location: [facility];  Service: Ophthalmology;  Laterality: Right;  CDE=9.61   CATARACT EXTRACTION W/PHACO Left 12/26/2012   Procedure: CATARACT EXTRACTION PHACO AND INTRAOCULAR LENS PLACEMENT (IOC);  Surgeon: Loraine Leriche T. Nile Riggs, MD;  Location: [facility];  Service: Ophthalmology;  Laterality: Left;  CDE:7.74   COLONOSCOPY  Jan 2002; 2012   2002: Dr. Katrinka Blazing, ext. hemorrhoids, nl colon; 2012-adenomatous polyps, gastritis on EGD   COLONOSCOPY N/A 04/24/2019   Procedure: COLONOSCOPY;  Surgeon: Malissa Hippo, MD;  [facility];  Service: Endoscopy;  Laterality: N/A;   CYSTOSCOPY W/ URETERAL STENT PLACEMENT Bilateral 02/25/2018   Procedure: CYSTOSCOPY WITH BILATERAL RETROGRADE PYELOGRAM/BILATERAL URETERAL STENT PLACEMENT, BLADDER BIOPSIES WITH FULGERATION;  Surgeon: Jerilee Field, MD;  Location: [facility];  Service: Urology;  Laterality: Bilateral;   CYSTOSCOPY/URETEROSCOPY/HOLMIUM LASER/STENT PLACEMENT Bilateral 03/21/2018   Procedure: CYSTOSCOPY/URETEROSCOPY/HOLMIUM LASER/STENT PLACEMENT;  Surgeon: Jerilee Field, MD;  Location: [facility];  Service: Urology;  Laterality: Bilateral;  ONLY NEEDS 60 MIN   DILATION AND CURETTAGE OF UTERUS     ESOPHAGOGASTRODUODENOSCOPY  08/24/2011   ESOPHAGOGASTRODUODENOSCOPY; esophageal dilatation; Malissa Hippo, MD;   ESOPHAGOGASTRODUODENOSCOPY N/A 04/22/2019   Procedure: ESOPHAGOGASTRODUODENOSCOPY (EGD);  Surgeon:  Corbin Ade, MD;  [facility];  Service: Endoscopy;  Laterality: N/A;   ESOPHAGOGASTRODUODENOSCOPY (EGD) WITH PROPOFOL N/A 04/22/2022   Procedure: ESOPHAGOGASTRODUODENOSCOPY (EGD) WITH PROPOFOL;  Surgeon: Dolores Frame, MD;  [facility];  Service: Gastroenterology;  Laterality: N/A;  with possible esophageal dilation   GIVENS CAPSULE STUDY  08/18/2011   Procedure: GIVENS CAPSULE STUDY;  Surgeon: Malissa Hippo, MD;  [facility];  Service: Endoscopy;  Laterality: N/A;  730   HOT HEMOSTASIS  04/22/2022   Procedure: HOT HEMOSTASIS (ARGON PLASMA COAGULATION/BICAP);  Surgeon: Marguerita Merles, Reuel Boom, MD;  [facility];  Service: Gastroenterology;;   KNEE ARTHROSCOPY W/ MENISCECTOMY  90's   Left   MALONEY DILATION  04/22/2019   Procedure: MALONEY DILATION;  Surgeon: Corbin Ade, MD;  [facility];  Service: Endoscopy;;   ORIF ANKLE FRACTURE Right 90's   TONSILLECTOMY     TOTAL KNEE ARTHROPLASTY  10/20/2011   Procedure: TOTAL KNEE ARTHROPLASTY;  Surgeon: Loreta Ave, MD;  Location: [facility];  Service: Orthopedics;  Laterality: Left;   UPPER GASTROINTESTINAL ENDOSCOPY     URETHRAL DILATION      Current Outpatient Medications  Medication Sig Dispense Refill   acetaminophen (TYLENOL) 500 MG tablet Take one tablet two times daily for chronic pain (Patient taking differently: Take 500 mg by mouth in the morning and at bedtime. for chronic pain) 60 tablet 5   albuterol (VENTOLIN HFA) 108 (90 Base) MCG/ACT inhaler Inhale 2 puffs into the lungs every 6 (six) hours as needed for wheezing or shortness of breath. 18 each 3   amLODipine (NORVASC) 10 MG tablet TAKE ONE TABLET BY MOUTH ONCE DAILY 90 tablet 11   atorvastatin (LIPITOR) 40 MG tablet TAKE 1 TABLET BY MOUTH ONCE DAILY. 90 tablet 11   azelastine (OPTIVAR) 0.05 % ophthalmic solution Place 1 drop into the right eye 2 (two) times daily. 6 mL 5   BD INSULIN SYRINGE U/F 31G X 5/16" 1  ML MISC USE AS DIRECTED 100 each 0   busPIRone (BUSPAR) 7.5 MG tablet TAKE ONE TABLET BY MOUTH THREE TIMES DAILY 90 tablet 11   cloNIDine (CATAPRES) 0.2 MG tablet Take 1 tablet (0.2 mg total) by mouth 3 (three) times daily. 180 tablet 0   clotrimazole-betamethasone (LOTRISONE) cream Apply 1 application topically 2 (two) times daily. D/c vusion ointment 30 g 3   clotrimazole-betamethasone (LOTRISONE) cream Apply twice daily to affected areas for 1 week , then as needed 45 g S   COMBIGAN 0.2-0.5 % ophthalmic solution Place 1 drop into both eyes 2 (two) times daily.  6   dabigatran (PRADAXA) 150 MG CAPS capsule TAKE ONE CAPSULE BY MOUTH EVERY TWELVE HOURS 60 capsule 2   docusate sodium (COLACE) 100 MG capsule Take 1 capsule (100 mg total) by mouth 2 (two) times daily. 60 capsule 2   donepezil (ARICEPT) 10 MG tablet TAKE ONE TABLET BY MOUTH AT BEDTIME 90 tablet 11   ferrous sulfate 325 (65 FE) MG EC tablet Take 1 tablet (325 mg total) by mouth 2 (two) times daily. 60 tablet 3   glucose blood (ACCU-CHEK AVIVA PLUS) test strip Use as instructed three times daily dx e11.65 300 strip 5   glycerin adult 2 g suppository Place 1 suppository rectally as needed for constipation. 12 suppository 0   hydrOXYzine (VISTARIL) 25 MG capsule TAKE (1) CAPSULE BY MOUTH THREE TIMES DAILY 270 capsule 1   imipramine (TOFRANIL) 50 MG tablet TAKE 2 TABLETS BY MOUTH AT BEDTIME 180 tablet 4   insulin NPH-regular Human (NOVOLIN 70/30) (70-30) 100 UNIT/ML injection INJECT 55 UNITS INTO THE SKIN TWICE DAILY WITH A MEAL 30 mL 5   Insulin Pen Needle (ULTICARE MINI PEN NEEDLES) 31G X 6 MM MISC USE TO INJECT NOVOLIN TWICE DAILY. 100 each 5   Insulin Syringe-Needle U-100 32G X 5/16" 1 ML MISC 1 each by Does not apply route in the morning and at bedtime. Use to inject insulin twice daily dx e11.65 100 each 11   meclizine (ANTIVERT) 25 MG tablet Take 1 tablet (25 mg total) by mouth 3 (three) times daily as needed for dizziness. 60 tablet  1   montelukast (SINGULAIR) 10 MG tablet Take 1 tablet (10 mg total) by mouth at bedtime. 90 tablet 5   nystatin (MYCOSTATIN/NYSTOP) powder APPLY TOPCIALLY TO RASH UNDER BREASTS ONCE DAILY AS NEEDED. 15 g 0   olmesartan (BENICAR) 20 MG tablet Take 1 tablet (20 mg total) by mouth daily. 30 tablet 5  olopatadine (PATANOL) 0.1 % ophthalmic solution Place 1 drop into both eyes 2 (two) times daily. 5 mL 1   oxyCODONE-acetaminophen (PERCOCET) 10-325 MG tablet Take one tablet by mouth two times daily for chronic pain 60 tablet 0   pantoprazole (PROTONIX) 40 MG tablet Take 1 tablet (40 mg total) by mouth 2 (two) times daily. 60 tablet 0   SUMAtriptan (IMITREX) 25 MG tablet TAKE 1 TABLET BY MOUTH AS NEEDED FOR MIGRAINE. MAY REPEAT IN 2 HOURS IF HEADACHE PERSISTS OR RECURS. 10 tablet 11   topiramate (TOPAMAX) 100 MG tablet TAKE 1 TABLET BY MOUTH TWICE DAILY 180 tablet 11   UNABLE TO FIND Standard wheelchair with elevating leg rests DX M25.561, M54.41 1 each 0   Vitamin D, Ergocalciferol, (DRISDOL) 1.25 MG (50000 UNIT) CAPS capsule Take 1 CAPSULE BY MOUTH ONCE A WEEK 12 capsule 1   UNABLE TO FIND Skilled Nursing to eval and treat as indicated. Will need routine lab work and vital sign assessments. 1 each 0   UNABLE TO FIND Home Health to evaluate and treat as indicated. Dx: Z74.2 1 each 0   UNABLE TO FIND Med Name: toxassure 8 dx: PAIN MANAGEMENT CONTRACT Z08.89 1 each 0   No current facility-administered medications for this visit.   Facility-Administered Medications Ordered in Other Visits  Medication Dose Route Frequency Provider Last Rate Last Admin   fentaNYL (SUBLIMAZE) injection 25-50 mcg  25-50 mcg Intravenous Q5 min PRN Laurene Footman, MD        Allergies as of 05/27/2022 - Review Complete 05/27/2022  Allergen Reaction Noted   Penicillins Itching 11/16/2013   Fentanyl Itching 08/09/2011   Sulfonamide derivatives Hives 02/03/2007   Sulfur  11/12/2019   Codeine Itching and Rash      Family History  Problem Relation Age of Onset   Diabetes Mother    Hypertension Mother    Arthritis Mother    Heart failure Mother    Migraines Mother    Kidney failure Mother    Leukemia Father    Migraines Father    Kidney failure Father    Diabetes Sister    Hypertension Sister    Diabetes Brother    Hypertension Brother    Hypertension Brother    Hypertension Brother    Hypertension Brother    Diabetes Brother    Diabetes Brother    Diabetes Brother    Pulmonary embolism Sister    Migraines Sister    Kidney failure Brother    Colon cancer Neg Hx    Colon polyps Neg Hx     Social History   Socioeconomic History   Marital status: Widowed    Spouse name: Not on file   Number of children: 2   Years of education: 51   Highest education level: Not on file  Occupational History   Occupation: retired     Comment: Systems analyst Office manager)    Employer: RETIRED  Tobacco Use   Smoking status: Never   Smokeless tobacco: Never  Vaping Use   Vaping Use: Never used  Substance and Sexual Activity   Alcohol use: No   Drug use: No   Sexual activity: Not Currently    Birth control/protection: Post-menopausal  Other Topics Concern   Not on file  Social History Narrative   Lives with son.   Right-handed.   1 cup caffeine per day.   Social Determinants of Health   Financial Resource Strain: Low Risk  (09/11/2021)   Overall Physicist, medical Strain (  CARDIA)    Difficulty of Paying Living Expenses: Not hard at all  Food Insecurity: No Food Insecurity (04/26/2022)   Hunger Vital Sign    Worried About Running Out of Food in the Last Year: Never true    Ran Out of Food in the Last Year: Never true  Transportation Needs: No Transportation Needs (04/20/2022)   PRAPARE - Administrator, Civil Service (Medical): No    Lack of Transportation (Non-Medical): No  Physical Activity: Inactive (09/11/2021)   Exercise Vital Sign    Days of Exercise per Week: 0 days     Minutes of Exercise per Session: 0 min  Stress: No Stress Concern Present (09/11/2021)   [employer] of Occupational Health - Occupational Stress Questionnaire    Feeling of Stress : Not at all  Social Connections: Socially Isolated (09/11/2021)   Social Connection and Isolation Panel [NHANES]    Frequency of Communication with Friends and Family: More than three times a week    Frequency of Social Gatherings with Friends and Family: More than three times a week    Attends Religious Services: Never    Database administrator or Organizations: No    Attends Banker Meetings: Never    Marital Status: Widowed    Review of systems General: negative for malaise, night sweats, fever, chills, weight loss Neck: Negative for lumps, goiter, pain and significant neck swelling Resp: Negative for cough, wheezing, dyspnea at rest CV: Negative for chest pain, leg swelling, palpitations, orthopnea GI: denies melena, hematochezia, nausea, vomiting, diarrhea, constipation, dysphagia, odyonophagia, early satiety or unintentional weight loss.  MSK: Negative for joint pain or swelling, back pain, and muscle pain. Derm: Negative for itching or rash Psych: Denies depression, anxiety, memory loss, confusion. No homicidal or suicidal ideation.  Heme: Negative for prolonged bleeding, bruising easily, and swollen nodes. Endocrine: Negative for cold or heat intolerance, polyuria, polydipsia and goiter. Neuro: negative for tremor, gait imbalance, syncope and seizures. The remainder of the review of systems is noncontributory.  Physical Exam: There were no vitals taken for this visit. General:   Alert and oriented. No distress noted. Pleasant and cooperative.  Head:  Normocephalic and atraumatic. Eyes:  Conjuctiva clear without scleral icterus. Mouth:  Oral mucosa pink and moist. Good dentition. No lesions. Heart: Normal rate and rhythm, s1 and s2 heart sounds present.  Lungs: Clear lung sounds  in all lobes. Respirations equal and unlabored. Abdomen:  +BS, soft, non-tender and non-distended. No rebound or guarding. No HSM or masses noted. Derm: No palmar erythema or jaundice Msk:  Symmetrical without gross deformities. Normal posture. Extremities:  Without edema. Neurologic:  Alert and  oriented x4 Psych:  Alert and cooperative. Normal mood and affect.  Invalid input(s): "6 MONTHS"   ASSESSMENT: FLORA HILLSTEAD is a 82 y.o. female presenting today for hospital follow up for anemia.  Recent admission for severe anemia/dysphagia, underwent EGD with findings of candida esophagitis, angiodysplastic lesion in the stomach and gastritis. Started on po iron and PPI BID as well as course of clotrimazole which she has completed with resolution of her dysphagia. Last hgb was 9.7 during her admission. No repeat h&h since d/c due to lab issues obtaining blood. Family denies rectal bleeding or melena. I discussed proceeding with colonoscopy with the patient and family to rule out any other underlying sources of GI bleeding given her last TCS was 3 year ago, we discussed risks vs benefits, in regards to patient's age and underlying  multimorbidities. Family expressed concern that patient will likely not drink all of the prep for the procedure and are hesitant to proceed with patient undergoing colonoscopy given her age and other health issues. At this time, I am recommended to continue with PPI BID, daily iron, we will recheck h&h and Iron studies, if she remains with IDA, would strongly recommend proceeding with colonoscopy for further evaluation, if labs are stable and family still wishes to hold off, I think it would be reasonable given her age and other health issues to avoid undergoing colonoscopy.  Patient as well as her sister and niece feel comfortable with this plan.    PLAN:  Continue PPI BID  2. Repeat h&h/iron studies 3. Continue PO iron 4. Colonoscopy if IDA persists or family changes their  mind about proceeding  All questions were answered, patient verbalized understanding and is in agreement with plan as outlined above.   Follow Up: 3 months   Domonic Kimball L. Jeanmarie Hubert, MSN, APRN, AGNP-C Adult-Gerontology Nurse Practitioner [facility]  I have reviewed the note and agree with the APP's assessment as described in this progress note  Katrinka Blazing, MD Gastroenterology and Hepatology [facility]

## 2022-05-27 NOTE — Patient Instructions (Signed)
Please continue daily iron Continue protonix 40mg  daily Please make me aware of any rectal bleeding or black stools We will recheck blood and iron counts today As discussed, we can wait to see how her labs look but if labs are still showing anemia, I would highly recommend proceeding with colonoscopy for further evaluation  Follow up 3 months

## 2022-05-28 LAB — IRON,TIBC AND FERRITIN PANEL
Ferritin: 45 ng/mL (ref 15–150)
Iron Saturation: 12 % — ABNORMAL LOW (ref 15–55)
Iron: 31 ug/dL (ref 27–139)
Total Iron Binding Capacity: 260 ug/dL (ref 250–450)
UIBC: 229 ug/dL (ref 118–369)

## 2022-05-28 LAB — HEMOGLOBIN AND HEMATOCRIT, BLOOD
Hematocrit: 32.6 % — ABNORMAL LOW (ref 34.0–46.6)
Hemoglobin: 10.5 g/dL — ABNORMAL LOW (ref 11.1–15.9)

## 2022-06-02 ENCOUNTER — Other Ambulatory Visit: Payer: Self-pay | Admitting: Family Medicine

## 2022-06-10 ENCOUNTER — Telehealth (INDEPENDENT_AMBULATORY_CARE_PROVIDER_SITE_OTHER): Payer: Self-pay | Admitting: Gastroenterology

## 2022-06-10 ENCOUNTER — Other Ambulatory Visit (INDEPENDENT_AMBULATORY_CARE_PROVIDER_SITE_OTHER): Payer: Self-pay | Admitting: Gastroenterology

## 2022-06-10 DIAGNOSIS — D509 Iron deficiency anemia, unspecified: Secondary | ICD-10-CM

## 2022-06-10 NOTE — Telephone Encounter (Signed)
See phone note 06/10/22 regarding colonoscopy

## 2022-06-15 ENCOUNTER — Telehealth: Payer: Self-pay | Admitting: Family Medicine

## 2022-06-15 NOTE — Telephone Encounter (Signed)
[facility] calling has question on medication novlin  Call back # [phone]

## 2022-06-17 ENCOUNTER — Other Ambulatory Visit: Payer: Self-pay

## 2022-06-17 MED ORDER — NOVOLIN 70/30 (70-30) 100 UNIT/ML ~~LOC~~ SUSP: SUBCUTANEOUS | 5 refills | Status: DC

## 2022-06-17 NOTE — Telephone Encounter (Signed)
Resent rx that ok to dispense flexpen

## 2022-06-17 NOTE — Telephone Encounter (Signed)
[name] calling back from [employer]. Novolog 70-30 gets in vival okay to get flex pen if so needs a new prescription. The vival is not available

## 2022-06-23 ENCOUNTER — Other Ambulatory Visit: Payer: Self-pay | Admitting: Family Medicine

## 2022-06-23 ENCOUNTER — Telehealth: Payer: Self-pay | Admitting: Family Medicine

## 2022-06-23 NOTE — Telephone Encounter (Signed)
Prescription Request  06/23/2022  LOV: 03/18/2022  What is the name of the medication or equipment? oxyCODONE-acetaminophen (PERCOCET) 10-325 MG tablet   Have you contacted your pharmacy to request a refill? No   Which pharmacy would you like this sent to?  [facility] - [place], [place] - [address] [place] [address] Phone: [phone] Fax: [phone]     Patient notified that their request is being sent to the clinical staff for review and that they should receive a response within 2 business days.   Please advise at Mobile [phone] (mobile)

## 2022-07-05 ENCOUNTER — Other Ambulatory Visit: Payer: Self-pay | Admitting: Family Medicine

## 2022-07-08 ENCOUNTER — Encounter (INDEPENDENT_AMBULATORY_CARE_PROVIDER_SITE_OTHER): Payer: Self-pay | Admitting: *Deleted

## 2022-07-13 ENCOUNTER — Other Ambulatory Visit: Payer: Self-pay | Admitting: Family Medicine

## 2022-07-24 ENCOUNTER — Other Ambulatory Visit: Payer: Self-pay | Admitting: Family Medicine

## 2022-07-26 ENCOUNTER — Telehealth: Payer: Self-pay | Admitting: Family Medicine

## 2022-07-26 NOTE — Telephone Encounter (Signed)
Refill request sent to provider.

## 2022-07-26 NOTE — Telephone Encounter (Signed)
Prescription Request  07/26/2022  LOV: 03/18/2022  What is the name of the medication or equipment? oxyCODONE-acetaminophen (PERCOCET) 10-325 MG tablet [[id number]]   Have you contacted your pharmacy to request a refill? No   Which pharmacy would you like this sent to? [facility]    Patient notified that their request is being sent to the clinical staff for review and that they should receive a response within 2 business days.   Please advise at [facility] [phone]

## 2022-07-28 ENCOUNTER — Ambulatory Visit (INDEPENDENT_AMBULATORY_CARE_PROVIDER_SITE_OTHER): Payer: Medicare HMO | Admitting: Family Medicine

## 2022-07-28 ENCOUNTER — Encounter: Payer: Self-pay | Admitting: Family Medicine

## 2022-07-28 VITALS — BP 144/82 | HR 81 | Ht 66.0 in

## 2022-07-28 DIAGNOSIS — I10 Essential (primary) hypertension: Secondary | ICD-10-CM

## 2022-07-28 DIAGNOSIS — R413 Other amnesia: Secondary | ICD-10-CM

## 2022-07-28 DIAGNOSIS — R8290 Unspecified abnormal findings in urine: Secondary | ICD-10-CM

## 2022-07-28 DIAGNOSIS — Z0289 Encounter for other administrative examinations: Secondary | ICD-10-CM

## 2022-07-28 DIAGNOSIS — E785 Hyperlipidemia, unspecified: Secondary | ICD-10-CM | POA: Diagnosis not present

## 2022-07-28 DIAGNOSIS — M5441 Lumbago with sciatica, right side: Secondary | ICD-10-CM

## 2022-07-28 DIAGNOSIS — G8929 Other chronic pain: Secondary | ICD-10-CM | POA: Diagnosis not present

## 2022-07-28 DIAGNOSIS — E1149 Type 2 diabetes mellitus with other diabetic neurological complication: Secondary | ICD-10-CM

## 2022-07-28 NOTE — Patient Instructions (Addendum)
F/U in 4 months, call if you need me sooner  Labs today , we will call with  results  Nurse please ensure pt has novolin PEN prescribed , not the vial, cost effective and easier to use  No falls  Sorry to hurt you  Urine for CCUA  Thanks for choosing [facility], we consider it a privelige to serve you.

## 2022-07-29 ENCOUNTER — Emergency Department (HOSPITAL_COMMUNITY): Payer: Medicare HMO

## 2022-07-29 ENCOUNTER — Other Ambulatory Visit: Payer: Self-pay

## 2022-07-29 ENCOUNTER — Encounter (HOSPITAL_COMMUNITY): Payer: Self-pay | Admitting: *Deleted

## 2022-07-29 ENCOUNTER — Emergency Department (HOSPITAL_COMMUNITY)
Admission: EM | Admit: 2022-07-29 | Discharge: 2022-07-29 | Disposition: A | Discharge status: Home / Self Care | Payer: Medicare HMO | Attending: Emergency Medicine | Admitting: Emergency Medicine

## 2022-07-29 DIAGNOSIS — R109 Unspecified abdominal pain: Secondary | ICD-10-CM | POA: Diagnosis not present

## 2022-07-29 DIAGNOSIS — R8290 Unspecified abnormal findings in urine: Secondary | ICD-10-CM

## 2022-07-29 DIAGNOSIS — R072 Precordial pain: Secondary | ICD-10-CM | POA: Insufficient documentation

## 2022-07-29 DIAGNOSIS — R61 Generalized hyperhidrosis: Secondary | ICD-10-CM | POA: Diagnosis not present

## 2022-07-29 DIAGNOSIS — R079 Chest pain, unspecified: Secondary | ICD-10-CM

## 2022-07-29 DIAGNOSIS — I10 Essential (primary) hypertension: Secondary | ICD-10-CM | POA: Diagnosis not present

## 2022-07-29 DIAGNOSIS — R112 Nausea with vomiting, unspecified: Secondary | ICD-10-CM | POA: Diagnosis not present

## 2022-07-29 DIAGNOSIS — R110 Nausea: Secondary | ICD-10-CM | POA: Diagnosis not present

## 2022-07-29 DIAGNOSIS — F0390 Unspecified dementia without behavioral disturbance: Secondary | ICD-10-CM | POA: Diagnosis not present

## 2022-07-29 DIAGNOSIS — R0789 Other chest pain: Secondary | ICD-10-CM | POA: Diagnosis not present

## 2022-07-29 LAB — URINALYSIS, ROUTINE W REFLEX MICROSCOPIC
Bilirubin, UA: NEGATIVE
Glucose, UA: NEGATIVE
Ketones, UA: NEGATIVE
Nitrite, UA: POSITIVE — AB
RBC, UA: NEGATIVE
Specific Gravity, UA: 1.009 (ref 1.005–1.030)
Urobilinogen, Ur: 0.2 mg/dL (ref 0.2–1.0)
pH, UA: 7 (ref 5.0–7.5)

## 2022-07-29 LAB — CBC WITH DIFFERENTIAL/PLATELET
Abs Immature Granulocytes: 0.06 10*3/uL (ref 0.00–0.07)
Basophils Absolute: 0 10*3/uL (ref 0.0–0.1)
Basophils Relative: 0 %
Eosinophils Absolute: 0 10*3/uL (ref 0.0–0.5)
Eosinophils Relative: 0 %
HCT: 36.1 % (ref 36.0–46.0)
Hemoglobin: 11.1 g/dL — ABNORMAL LOW (ref 12.0–15.0)
Immature Granulocytes: 1 %
Lymphocytes Relative: 18 %
Lymphs Abs: 1.6 10*3/uL (ref 0.7–4.0)
MCH: 27 pg (ref 26.0–34.0)
MCHC: 30.7 g/dL (ref 30.0–36.0)
MCV: 87.8 fL (ref 80.0–100.0)
Monocytes Absolute: 0.6 10*3/uL (ref 0.1–1.0)
Monocytes Relative: 7 %
Neutro Abs: 6.3 10*3/uL (ref 1.7–7.7)
Neutrophils Relative %: 74 %
Platelets: 280 10*3/uL (ref 150–400)
RBC: 4.11 MIL/uL (ref 3.87–5.11)
RDW: 16.3 % — ABNORMAL HIGH (ref 11.5–15.5)
WBC: 8.6 10*3/uL (ref 4.0–10.5)
nRBC: 0 % (ref 0.0–0.2)

## 2022-07-29 LAB — LIPASE, BLOOD: Lipase: 27 U/L (ref 11–51)

## 2022-07-29 LAB — TROPONIN I (HIGH SENSITIVITY)
Troponin I (High Sensitivity): 7 ng/L (ref <18)
Troponin I (High Sensitivity): 7 ng/L (ref <18)

## 2022-07-29 LAB — COMPREHENSIVE METABOLIC PANEL
ALT: 13 U/L (ref 0–44)
AST: 13 U/L — ABNORMAL LOW (ref 15–41)
Albumin: 3.5 g/dL (ref 3.5–5.0)
Alkaline Phosphatase: 101 U/L (ref 38–126)
Anion gap: 6 (ref 5–15)
BUN: 17 mg/dL (ref 8–23)
CO2: 29 mmol/L (ref 22–32)
Calcium: 8.8 mg/dL — ABNORMAL LOW (ref 8.9–10.3)
Chloride: 101 mmol/L (ref 98–111)
Creatinine, Ser: 1.22 mg/dL — ABNORMAL HIGH (ref 0.44–1.00)
GFR, Estimated: 44 mL/min — ABNORMAL LOW (ref >60)
Glucose, Bld: 168 mg/dL — ABNORMAL HIGH (ref 70–99)
Potassium: 4.2 mmol/L (ref 3.5–5.1)
Sodium: 136 mmol/L (ref 135–145)
Total Bilirubin: 0.2 mg/dL — ABNORMAL LOW (ref 0.3–1.2)
Total Protein: 7.4 g/dL (ref 6.5–8.1)

## 2022-07-29 LAB — MICROSCOPIC EXAMINATION
Casts: NONE SEEN /lpf
RBC, Urine: NONE SEEN /hpf (ref 0–2)

## 2022-07-29 NOTE — ED Provider Notes (Signed)
[place] EMERGENCY DEPARTMENT AT [facility] Provider Note   CSN: [id number] Arrival date & time: 07/29/22  1825     History  Chief Complaint  Patient presents with   Chest Pain    [name] is a 82 y.o. female.  Level 5 caveat to area dementia.  Patient brought in from home after experiencing substernal chest pain around 5 PM tonight associated with diaphoresis nausea and 1 episode of vomiting.  Patient was given Zofran by EMS, not sure if line infiltrated.  Initially chest pain was 10 out of 10, now she states it is resolved.  She did have a little bit of abdominal pain earlier also but that is resolved.  Still feels a little nauseous.  She denies cardiac history.  He is on 2 L of oxygen at all times.  The history is provided by the patient and the EMS personnel.  Chest Pain Pain location:  Substernal area Pain severity:  Severe Onset quality:  Sudden Duration:  2 hours Progression:  Resolved Chronicity:  New Context: at rest   Associated symptoms: abdominal pain, diaphoresis, nausea and vomiting   Associated symptoms: no cough, no fever and no shortness of breath        Home Medications Prior to Admission medications   Medication Sig Start Date End Date Taking? Authorizing Provider  acetaminophen (TYLENOL) 500 MG tablet Take one tablet two times daily for chronic pain Patient taking differently: Take 500 mg by mouth in the morning and at bedtime. for chronic pain 04/19/19   Kerri Perches, MD  albuterol (VENTOLIN HFA) 108 (90 Base) MCG/ACT inhaler Inhale 2 puffs into the lungs every 6 (six) hours as needed for wheezing or shortness of breath. 08/14/20   Kerri Perches, MD  amLODipine (NORVASC) 10 MG tablet TAKE ONE TABLET BY MOUTH ONCE DAILY 05/14/22   Kerri Perches, MD  atorvastatin (LIPITOR) 40 MG tablet TAKE 1 TABLET BY MOUTH ONCE DAILY. 05/14/22   Kerri Perches, MD  azelastine (OPTIVAR) 0.05 % ophthalmic solution Place 1 drop into the  right eye 2 (two) times daily. 03/18/22   Kerri Perches, MD  BD INSULIN SYRINGE U/F 31G X 5/16" 1 ML MISC USE AS DIRECTED 05/14/22   Kerri Perches, MD  busPIRone (BUSPAR) 7.5 MG tablet TAKE ONE TABLET BY MOUTH THREE TIMES DAILY 05/14/22   Kerri Perches, MD  cloNIDine (CATAPRES) 0.2 MG tablet Take 1 tablet (0.2 mg total) by mouth 3 (three) times daily. 04/23/22 06/22/22  Sherryll Burger, Pratik D, DO  clotrimazole-betamethasone (LOTRISONE) cream Apply 1 application topically 2 (two) times daily. D/c vusion ointment 08/14/20   Kerri Perches, MD  clotrimazole-betamethasone (LOTRISONE) cream Apply twice daily to affected areas for 1 week , then as needed 03/18/22   Kerri Perches, MD  COMBIGAN 0.2-0.5 % ophthalmic solution Place 1 drop into both eyes 2 (two) times daily. 03/23/17   [provider]  dabigatran (PRADAXA) 150 MG CAPS capsule TAKE ONE CAPSULE BY MOUTH EVERY TWELVE HOURS 06/02/22   Kerri Perches, MD  docusate sodium (COLACE) 100 MG capsule Take 1 capsule (100 mg total) by mouth 2 (two) times daily. 04/23/22 04/23/23  Sherryll Burger, Pratik D, DO  donepezil (ARICEPT) 10 MG tablet TAKE ONE TABLET BY MOUTH AT BEDTIME 05/14/22   Kerri Perches, MD  ferrous sulfate 325 (65 FE) MG EC tablet Take 1 tablet (325 mg total) by mouth 2 (two) times daily. 04/23/22 04/23/23  Sherryll Burger, Pratik  D, DO  glucose blood (ACCU-CHEK AVIVA PLUS) test strip Use as instructed three times daily dx e11.65 03/18/22   Kerri Perches, MD  glycerin adult 2 g suppository Place 1 suppository rectally as needed for constipation. 04/27/22   Del Nigel Berthold, FNP  hydrOXYzine (ATARAX) 25 MG tablet TAKE ONE CAPSULE BY MOUTH THREE TIMES DAILY 07/13/22   Kerri Perches, MD  imipramine (TOFRANIL) 50 MG tablet TAKE 2 TABLETS BY MOUTH AT BEDTIME 05/08/21   Kerri Perches, MD  insulin NPH-regular Human (NOVOLIN 70/30) (70-30) 100 UNIT/ML injection INJECT 55 UNITS INTO THE SKIN TWICE DAILY WITH MEALS 07/05/22   Kerri Perches, MD  Insulin Pen Needle (ULTICARE MINI PEN NEEDLES) 31G X 6 MM MISC USE TO INJECT NOVOLIN TWICE DAILY. 01/14/22   Kerri Perches, MD  Insulin Syringe-Needle U-100 32G X 5/16" 1 ML MISC 1 each by Does not apply route in the morning and at bedtime. Use to inject insulin twice daily dx e11.65 05/16/19   Freddy Finner, NP  meclizine (ANTIVERT) 25 MG tablet Take 1 tablet (25 mg total) by mouth 3 (three) times daily as needed for dizziness. 11/28/19   Kerri Perches, MD  montelukast (SINGULAIR) 10 MG tablet Take 1 tablet (10 mg total) by mouth at bedtime. 01/14/22   Kerri Perches, MD  nystatin (MYCOSTATIN/NYSTOP) powder APPLY TOPCIALLY TO RASH UNDER BREASTS ONCE DAILY AS NEEDED. 06/03/21   Kerri Perches, MD  olopatadine (PATANOL) 0.1 % ophthalmic solution Place 1 drop into both eyes 2 (two) times daily. 03/18/22   Kerri Perches, MD  oxyCODONE-acetaminophen (PERCOCET) 10-325 MG tablet TAKE ONE TABLET BY MOUTH TWICE DAILY FOR chronic pain 07/26/22   Kerri Perches, MD  pantoprazole (PROTONIX) 40 MG tablet Take 1 tablet (40 mg total) by mouth 2 (two) times daily. 05/27/22   Carlan, Chelsea L, NP  SUMAtriptan (IMITREX) 25 MG tablet TAKE 1 TABLET BY MOUTH AS NEEDED FOR MIGRAINE. MAY REPEAT IN 2 HOURS IF HEADACHE PERSISTS OR RECURS. 06/04/20   Kerri Perches, MD  topiramate (TOPAMAX) 100 MG tablet TAKE 1 TABLET BY MOUTH TWICE DAILY 02/16/22   Kerri Perches, MD  UNABLE TO FIND Standard wheelchair with elevating leg rests DX M25.561, M54.41 03/12/20   Kerri Perches, MD  UNABLE TO FIND Skilled Nursing to eval and treat as indicated. Will need routine lab work and vital sign assessments. 08/14/20   Kerri Perches, MD  UNABLE TO FIND Home Health to evaluate and treat as indicated. Dx: Z74.2 08/27/20   Kerri Perches, MD  UNABLE TO FIND Med Name: toxassure 8 dx: PAIN MANAGEMENT CONTRACT Z08.89 09/10/21   Kerri Perches, MD  Vitamin D, Ergocalciferol,  (DRISDOL) 1.25 MG (50000 UNIT) CAPS capsule Take 1 CAPSULE BY MOUTH ONCE A WEEK 05/17/22   Kerri Perches, MD      Allergies    Penicillins, Fentanyl, Sulfonamide derivatives, Sulfur, and Codeine    Review of Systems   Review of Systems  Unable to perform ROS: Dementia  Constitutional:  Positive for diaphoresis. Negative for fever.  Respiratory:  Negative for cough and shortness of breath.   Cardiovascular:  Positive for chest pain.  Gastrointestinal:  Positive for abdominal pain, nausea and vomiting.    Physical Exam Updated Vital Signs BP (!) 153/78   Pulse 81 Comment: [place] 2 L/M  Ht 5\' 6"  (1.676 m)   SpO2 97% Comment: [place] 2 L/M  BMI 39.06 kg/m  Physical Exam Vitals and nursing note reviewed.  Constitutional:      General: She is not in acute distress.    Appearance: She is well-developed. She is obese.  HENT:     Head: Normocephalic and atraumatic.  Eyes:     Conjunctiva/sclera: Conjunctivae normal.  Cardiovascular:     Rate and Rhythm: Normal rate and regular rhythm.     Heart sounds: Normal heart sounds. No murmur heard. Pulmonary:     Effort: Pulmonary effort is normal. No respiratory distress.     Breath sounds: Normal breath sounds.  Abdominal:     Palpations: Abdomen is soft.     Tenderness: There is no abdominal tenderness. There is no guarding or rebound.  Musculoskeletal:        General: No swelling.     Cervical back: Neck supple.     Right lower leg: No edema.     Left lower leg: No edema.  Skin:    General: Skin is warm and dry.     Capillary Refill: Capillary refill takes less than 2 seconds.  Neurological:     General: No focal deficit present.     Mental Status: She is alert.     ED Results / Procedures / Treatments   Labs (all labs ordered are listed, but only abnormal results are displayed) Labs Reviewed  COMPREHENSIVE METABOLIC PANEL - Abnormal; Notable for the following components:      Result Value   Glucose, Bld 168 (*)     Creatinine, Ser 1.22 (*)    Calcium 8.8 (*)    AST 13 (*)    Total Bilirubin 0.2 (*)    GFR, Estimated 44 (*)    All other components within normal limits  CBC WITH DIFFERENTIAL/PLATELET - Abnormal; Notable for the following components:   Hemoglobin 11.1 (*)    RDW 16.3 (*)    All other components within normal limits  LIPASE, BLOOD  TROPONIN I (HIGH SENSITIVITY)  TROPONIN I (HIGH SENSITIVITY)    EKG EKG Interpretation  Date/Time:  Thursday July 29 2022 18:40:43 EDT Ventricular Rate:  81 PR Interval:  183 QRS Duration: 95 QT Interval:  392 QTC Calculation: 455 R Axis:   7 Text Interpretation: Sinus rhythm Abnormal R-wave progression, early transition Left ventricular hypertrophy No significant change since prior 3/24 Confirmed by Meridee Score [phone]) on 07/29/2022 6:49:54 PM  Radiology DG Chest Portable 1 View  Result Date: 07/29/2022 CLINICAL DATA:  Left-sided chest pain EXAM: PORTABLE CHEST 1 VIEW COMPARISON:  04/20/2018 FINDINGS: Check shadow is enlarged but stable. Aortic calcifications are noted. The lungs are hypoinflated but clear. No bony abnormality is noted. IMPRESSION: Poor inspiratory effort without focal infiltrate. Electronically Signed   By: Alcide Clever M.D.   On: 07/29/2022 19:30    Procedures Procedures    Medications Ordered in ED Medications - No data to display  ED Course/ Medical Decision Making/ A&P Clinical Course as of 07/30/22 0937  Thu Jul 29, 2022  1912 Chest x-ray interpreted by me as widened mediastinum similar to prior, no gross infiltrate.  Awaiting radiology reading. [MB]  2143 Patient states she is pain-free and feels back to baseline.  Her labs are fairly unremarkable.  She would like to be discharged and I think that is reasonable.  Recommended close follow-up with PCP.  Return instructions discussed [MB]    Clinical Course User Index [MB] Terrilee Files, MD  Medical Decision Making Amount and/or  Complexity of Data Reviewed Labs: ordered. Radiology: ordered.   This patient complains of chest pain diaphoresis vomiting; this involves an extensive number of treatment Options and is a complaint that carries with it a high risk of complications and morbidity. The differential includes ACS, biliary colic, gastritis, peptic ulcer disease, pneumonia, PE  I ordered, reviewed and interpreted labs, which included CBC with normal white count normal hemoglobin, chemistries with elevated glucose chronically elevated creatinine normal LFTs, troponins flat I ordered imaging studies which included chest x-ray and I independently    visualized and interpreted imaging which showed no acute findings Additional history obtained from patient's family member Previous records obtained and reviewed in epic including recent PCP and GI notes Cardiac monitoring reviewed, normal sinus rhythm Social determinants considered, patient with social isolation and decreased physical activity Critical Interventions: None  After the interventions stated above, I reevaluated the patient and found patient to remain pain-free and symptom-free at this time Admission and further testing considered, no indications for admission or further workup.  Patient will be discharged home and follow-up with PCP.  Return instructions discussed         Final Clinical Impression(s) / ED Diagnoses Final diagnoses:  Nonspecific chest pain    Rx / DC Orders ED Discharge Orders     None         Terrilee Files, MD 07/30/22 [phone]

## 2022-07-29 NOTE — ED Triage Notes (Signed)
Pt BIB [facility] from home with left sided CP, non radiating. Pt had some diaphoresis and nausea, iv started and given zofran 4 mg given but IV infiltrated.  + SOB.  Pt normally wears O2 at night but has been wearing all day today. Pt took two TUMS at home with minimal relief 10 to 7/10 pain.

## 2022-07-29 NOTE — Discharge Instructions (Signed)
You were seen in the emergency department for an episode of chest pain.  You had lab work chest x-ray EKG that did not show an obvious explanation for your symptoms.  Please continue your regular medications and follow-up with your primary care doctor.  Return to the emergency department if any worsening or concerning symptoms.

## 2022-07-30 LAB — CMP14+EGFR
ALT: 10 IU/L (ref 0–32)
AST: 12 IU/L (ref 0–40)
Albumin/Globulin Ratio: 1.3
Albumin: 4.2 g/dL (ref 3.7–4.7)
Alkaline Phosphatase: 137 IU/L — ABNORMAL HIGH (ref 44–121)
BUN/Creatinine Ratio: 13 (ref 12–28)
BUN: 18 mg/dL (ref 8–27)
Bilirubin Total: <0.2 mg/dL (ref 0.0–1.2)
CO2: 25 mmol/L (ref 20–29)
Calcium: 9.1 mg/dL (ref 8.7–10.3)
Chloride: 100 mmol/L (ref 96–106)
Creatinine, Ser: 1.38 mg/dL — ABNORMAL HIGH (ref 0.57–1.00)
Globulin, Total: 3.3 g/dL (ref 1.5–4.5)
Glucose: 115 mg/dL — ABNORMAL HIGH (ref 70–99)
Potassium: 4 mmol/L (ref 3.5–5.2)
Sodium: 139 mmol/L (ref 134–144)
Total Protein: 7.5 g/dL (ref 6.0–8.5)
eGFR: 38 mL/min/{1.73_m2} — ABNORMAL LOW (ref >59)

## 2022-07-30 LAB — CBC
Hematocrit: 39.5 % (ref 34.0–46.6)
Hemoglobin: 12.7 g/dL (ref 11.1–15.9)
MCH: 27.1 pg (ref 26.6–33.0)
MCHC: 32.2 g/dL (ref 31.5–35.7)
MCV: 84 fL (ref 79–97)
Platelets: 359 10*3/uL (ref 150–450)
RBC: 4.69 x10E6/uL (ref 3.77–5.28)
RDW: 15.1 % (ref 11.7–15.4)
WBC: 11.3 10*3/uL — ABNORMAL HIGH (ref 3.4–10.8)

## 2022-07-30 LAB — MICROALBUMIN / CREATININE URINE RATIO
Creatinine, Urine: 31.9 mg/dL
Microalb/Creat Ratio: 169 mg/g creat — ABNORMAL HIGH (ref 0–29)
Microalbumin, Urine: 53.9 ug/mL

## 2022-07-30 LAB — LIPID PANEL
Chol/HDL Ratio: 2.5 ratio (ref 0.0–4.4)
Cholesterol, Total: 152 mg/dL (ref 100–199)
HDL: 61 mg/dL (ref >39)
LDL Chol Calc (NIH): 65 mg/dL (ref 0–99)
Triglycerides: 151 mg/dL — ABNORMAL HIGH (ref 0–149)
VLDL Cholesterol Cal: 26 mg/dL (ref 5–40)

## 2022-07-30 LAB — HEMOGLOBIN A1C
Est. average glucose Bld gHb Est-mCnc: 151 mg/dL
Hgb A1c MFr Bld: 6.9 % — ABNORMAL HIGH (ref 4.8–5.6)

## 2022-08-02 LAB — TOXASSURE SELECT 13 (MW), URINE

## 2022-08-03 ENCOUNTER — Telehealth: Payer: Self-pay

## 2022-08-03 NOTE — Telephone Encounter (Signed)
Transition Care Management Unsuccessful Follow-up Telephone Call  Date of discharge and from where:  [name] 6/13  Attempts:  2nd Attempt  Reason for unsuccessful TCM follow-up call:  Unable to leave message   [name] [facility] Guide, [facility] [phone] [address] [place], [place], [place] [address] Phone: [phone] Email: [email or web address]

## 2022-08-03 NOTE — Telephone Encounter (Signed)
Transition Care Management Unsuccessful Follow-up Telephone Call  Date of discharge and from where:  [place] 6/13  Attempts:  1st Attempt  Reason for unsuccessful TCM follow-up call:  Unable to leave message   Zakyla [facility] Guide,  [phone] [address], [place], [place] [address] Phone: [phone] Email: [email or web address]       

## 2022-08-04 ENCOUNTER — Encounter: Payer: Self-pay | Admitting: Family Medicine

## 2022-08-04 DIAGNOSIS — R8290 Unspecified abnormal findings in urine: Secondary | ICD-10-CM | POA: Insufficient documentation

## 2022-08-04 MED ORDER — OXYCODONE-ACETAMINOPHEN 10-325 MG PO TABS
ORAL_TABLET | ORAL | 0 refills | Status: AC
Start: 2022-08-25 — End: 2022-09-24

## 2022-08-04 MED ORDER — OXYCODONE-ACETAMINOPHEN 10-325 MG PO TABS: ORAL_TABLET | ORAL | 0 refills | Status: DC

## 2022-08-04 MED ORDER — OXYCODONE-ACETAMINOPHEN 10-325 MG PO TABS: ORAL_TABLET | ORAL | 0 refills | Status: AC

## 2022-08-04 NOTE — Assessment & Plan Note (Addendum)
Urine to be submitted for culture,no antibiotic prescribed as no fever and normal appetite and energy

## 2022-08-04 NOTE — Assessment & Plan Note (Signed)
Adequate control DASH diet and commitment to daily physical activity for a minimum of 30 minutes discussed and encouraged, as a part of hypertension management. The importance of attaining a healthy weight is also discussed.     07/29/2022    6:36 PM 07/28/2022    3:22 PM 07/28/2022    1:54 PM 07/28/2022    1:49 PM 05/27/2022    3:07 PM 04/27/2022   10:29 AM 04/23/2022    5:30 AM  BP/Weight  Systolic BP 153 144 162 180 138 152 146  Diastolic BP 78 82 82 81 82 82 65  Wt. (Lbs)      242   BMI      39.06 kg/m2

## 2022-08-04 NOTE — Assessment & Plan Note (Signed)
Hyperlipidemia:Low fat diet discussed and encouraged.   Lipid Panel  Lab Results  Component Value Date   CHOL 152 07/28/2022   HDL 61 07/28/2022   LDLCALC 65 07/28/2022   TRIG 151 (H) 07/28/2022   CHOLHDL 2.5 07/28/2022     No med change, excellent result

## 2022-08-04 NOTE — Assessment & Plan Note (Signed)
Unchanged , adequate control on current meds , continue same

## 2022-08-04 NOTE — Assessment & Plan Note (Signed)
The patient's Controlled Substance registry is reviewed and compliance confirmed. Adequacy of  Pain control and level of function is assessed. Medication dosing is adjusted as deemed appropriate. Twelve weeks of medication is prescribed , with a follow up appointment between 11 to 12 weeks .  

## 2022-08-04 NOTE — Assessment & Plan Note (Signed)
Controlled, no change in medication Ms. Kock is reminded of the importance of commitment to daily physical activity for 30 minutes or more, as able and the need to limit carbohydrate intake to 30 to 60 grams per meal to help with blood sugar control.   The need to take medication as prescribed, test blood sugar as directed, and to call between visits if there is a concern that blood sugar is uncontrolled is also discussed.   Ms. Fraser is reminded of the importance of daily foot exam, annual eye examination, and good blood sugar, blood pressure and cholesterol control.     Latest Ref Rng & Units 07/29/2022    6:52 PM 07/28/2022    2:25 PM 04/27/2022   11:47 AM 04/23/2022    5:03 AM 04/22/2022    4:35 AM  Diabetic Labs  HbA1c 4.8 - 5.6 %  6.9      Micro/Creat Ratio 0 - 29 mg/g creat  169      Chol 100 - 199 mg/dL  161      HDL >09 mg/dL  61      Calc LDL 0 - 99 mg/dL  65      Triglycerides 0 - 149 mg/dL  604      Creatinine 5.40 - 1.00 mg/dL 9.81  1.91  4.78  2.95  1.34       07/29/2022    6:36 PM 07/28/2022    3:22 PM 07/28/2022    1:54 PM 07/28/2022    1:49 PM 05/27/2022    3:07 PM 04/27/2022   10:29 AM 04/23/2022    5:30 AM  BP/Weight  Systolic BP 153 144 162 180 138 152 146  Diastolic BP 78 82 82 81 82 82 65  Wt. (Lbs)      242   BMI      39.06 kg/m2       Latest Ref Rng & Units 08/19/2020   12:00 AM 11/28/2019    1:40 PM  Foot/eye exam completion dates  Eye Exam No Retinopathy No Retinopathy       Foot Form Completion   Done     This result is from an external source.

## 2022-08-04 NOTE — Assessment & Plan Note (Signed)
Continue aricept 

## 2022-08-04 NOTE — Progress Notes (Addendum)
[name]     MRN: [id number]      DOB: January 15, 1941  Chief Complaint  Patient presents with   Follow-up    Follow up back pain    HPI [name] is here for follow up and re-evaluation of chronic medical conditions, medication management and review of any available recent lab and radiology data.  Preventive health is updated, specifically  Cancer screening and Immunization.   Questions or concerns regarding consultations or procedures which the PT has had in the interim are  addressed. The PT denies any adverse reactions to current medications since the last visit.  Chronic back pain is unchanged Malodorous urine , wants it checked , denies fever, flank pain, poor appetite and chills Denies polyuria, polydipsia, blurred vision , or hypoglycemic episodes.   ROS Denies recent fever or chills. Denies sinus pressure, nasal congestion, ear pain or sore throat. Denies chest congestion, productive cough or wheezing. Denies chest pains, palpitations and leg swelling Denies abdominal pain, nausea, vomiting,diarrhea or constipation.   Denies dysuria, frequency, hesitancy or incontinence.  Denies headaches, seizures, . Denies uncontrolled depression, anxiety or insomnia. Denies skin break down or rash.   PE  BP (!) 144/82   Pulse 81   Ht 5\' 6"  (1.676 m)   SpO2 95%   BMI 39.06 kg/m   Patient alert and oriented and in no cardiopulmonary distress.  HEENT: No facial asymmetry, EOMI,     Neck decreased ROM.  Chest: Clear to auscultation bilaterally.  CVS: S1, S2 no murmurs, no S3.Regular rate.  ABD: Soft non tender.   Ext: No edema  MS: Decreased  ROM spine, shoulders, hips and knees.  Skin: Intact, no ulcerations or rash noted.  Psych: Good eye contact, normal affect. Not  anxious or depressed appearing.  CNS: CN 2-12 intact, power,  normal throughout.no focal deficits noted.   Assessment & Plan  Type 2 diabetes mellitus with neurological complications  (HCC) Controlled, no change in medication [name] is reminded of the importance of commitment to daily physical activity for 30 minutes or more, as able and the need to limit carbohydrate intake to 30 to 60 grams per meal to help with blood sugar control.   The need to take medication as prescribed, test blood sugar as directed, and to call between visits if there is a concern that blood sugar is uncontrolled is also discussed.   [name] is reminded of the importance of daily foot exam, annual eye examination, and good blood sugar, blood pressure and cholesterol control.     Latest Ref Rng & Units 07/29/2022    6:52 PM 07/28/2022    2:25 PM 04/27/2022   11:47 AM 04/23/2022    5:03 AM 04/22/2022    4:35 AM  Diabetic Labs  HbA1c 4.8 - 5.6 %  6.9      Micro/Creat Ratio 0 - 29 mg/g creat  169      Chol 100 - 199 mg/dL  629      HDL >52 mg/dL  61      Calc LDL 0 - 99 mg/dL  65      Triglycerides 0 - 149 mg/dL  841      Creatinine 3.24 - 1.00 mg/dL 4.01  0.27  2.53  6.64  1.34       07/29/2022    6:36 PM 07/28/2022    3:22 PM 07/28/2022    1:54 PM 07/28/2022    1:49 PM 05/27/2022  3:07 PM 04/27/2022   10:29 AM 04/23/2022    5:30 AM  BP/Weight  Systolic BP 153 144 162 180 138 152 146  Diastolic BP 78 82 82 81 82 82 65  Wt. (Lbs)      242   BMI      39.06 kg/m2       Latest Ref Rng & Units 08/19/2020   12:00 AM 11/28/2019    1:40 PM  Foot/eye exam completion dates  Eye Exam No Retinopathy No Retinopathy       Foot Form Completion   Done     This result is from an external source.        Right-sided low back pain with right-sided sciatica Unchanged , adequate control on current meds , continue same  Hyperlipidemia LDL goal <100 Hyperlipidemia:Low fat diet discussed and encouraged.   Lipid Panel  Lab Results  Component Value Date   CHOL 152 07/28/2022   HDL 61 07/28/2022   LDLCALC 65 07/28/2022   TRIG 151 (H) 07/28/2022   CHOLHDL 2.5 07/28/2022     No med change,  excellent result  Essential hypertension Adequate control DASH diet and commitment to daily physical activity for a minimum of 30 minutes discussed and encouraged, as a part of hypertension management. The importance of attaining a healthy weight is also discussed.     07/29/2022    6:36 PM 07/28/2022    3:22 PM 07/28/2022    1:54 PM 07/28/2022    1:49 PM 05/27/2022    3:07 PM 04/27/2022   10:29 AM 04/23/2022    5:30 AM  BP/Weight  Systolic BP 153 144 162 180 138 152 146  Diastolic BP 78 82 82 81 82 82 65  Wt. (Lbs)      242   BMI      39.06 kg/m2        Memory loss Continue aricept   Encounter for chronic pain management The patient's Controlled Substance registry is reviewed and compliance confirmed. Adequacy of  Pain control and level of function is assessed. Medication dosing is adjusted as deemed appropriate. Twelve weeks of medication is prescribed , with a follow up appointment between 11 to 12 weeks .   Malodorous urine Urine to be submitted for culture,no antibiotic prescribed as no fever and normal appetite and energy

## 2022-08-06 ENCOUNTER — Other Ambulatory Visit: Payer: Self-pay | Admitting: Family Medicine

## 2022-08-09 ENCOUNTER — Other Ambulatory Visit: Payer: Self-pay | Admitting: Family Medicine

## 2022-08-10 DIAGNOSIS — R8290 Unspecified abnormal findings in urine: Secondary | ICD-10-CM | POA: Diagnosis not present

## 2022-08-17 ENCOUNTER — Other Ambulatory Visit: Payer: Self-pay | Admitting: Internal Medicine

## 2022-08-17 DIAGNOSIS — N3000 Acute cystitis without hematuria: Secondary | ICD-10-CM

## 2022-08-17 LAB — URINE CULTURE

## 2022-08-17 MED ORDER — NITROFURANTOIN MONOHYD MACRO 100 MG PO CAPS
100.0000 mg | ORAL_CAPSULE | Freq: Two times a day (BID) | ORAL | 0 refills | Status: DC
Start: 2022-08-17 — End: 2023-01-15

## 2022-08-31 ENCOUNTER — Encounter (INDEPENDENT_AMBULATORY_CARE_PROVIDER_SITE_OTHER): Payer: Self-pay | Admitting: Gastroenterology

## 2022-08-31 ENCOUNTER — Ambulatory Visit (INDEPENDENT_AMBULATORY_CARE_PROVIDER_SITE_OTHER): Payer: Medicare HMO | Admitting: Gastroenterology

## 2022-09-01 ENCOUNTER — Other Ambulatory Visit: Payer: Self-pay | Admitting: Family Medicine

## 2022-09-02 ENCOUNTER — Other Ambulatory Visit: Payer: Self-pay | Admitting: Family Medicine

## 2022-09-02 ENCOUNTER — Other Ambulatory Visit (INDEPENDENT_AMBULATORY_CARE_PROVIDER_SITE_OTHER): Payer: Self-pay | Admitting: Gastroenterology

## 2022-09-10 ENCOUNTER — Telehealth: Payer: Self-pay | Admitting: Family Medicine

## 2022-09-10 NOTE — Telephone Encounter (Signed)
Called in on patient behalf in regard to cloNIDine (CATAPRES) 0.2 MG tablet [[id number]]  ENDED    Wants a cll back needs clarification.  Family states that patient should still be taking med

## 2022-09-13 ENCOUNTER — Other Ambulatory Visit: Payer: Self-pay

## 2022-09-13 MED ORDER — CLONIDINE HCL 0.2 MG PO TABS: 0.2000 mg | ORAL_TABLET | Freq: Three times a day (TID) | ORAL | 0 refills | Status: DC

## 2022-09-14 ENCOUNTER — Ambulatory Visit: Payer: Medicare HMO

## 2022-09-14 DIAGNOSIS — R5381 Other malaise: Secondary | ICD-10-CM

## 2022-09-14 DIAGNOSIS — Z0000 Encounter for general adult medical examination without abnormal findings: Secondary | ICD-10-CM | POA: Diagnosis not present

## 2022-09-14 NOTE — Patient Instructions (Signed)

## 2022-09-14 NOTE — Progress Notes (Signed)
Subjective:   [name] is a 82 y.o. female who presents for Medicare Annual (Subsequent) preventive examination.  Visit Complete: Virtual  I connected with  [name] on 09/14/22 by a audio enabled telemedicine application and verified that I am speaking with the correct person using two identifiers.  Patient Location: Home  Provider Location: Office/Clinic  I discussed the limitations of evaluation and management by telemedicine. The patient expressed understanding and agreed to proceed.  Patient Medicare AWV questionnaire was completed by the patient on 09/14/2022; I have confirmed that all information answered by patient is correct and no changes since this date.  Vital Signs: Unable to obtain new vitals due to this being a telehealth visit.   Review of Systems     [name] , Thank you for taking time to come for your Medicare Wellness Visit. I appreciate your ongoing commitment to your health goals. Please review the following plan we discussed and let me know if I can assist you in the future.   These are the goals we discussed:  Goals      Blood Pressure < 140/90     HEMOGLOBIN A1C < 7     Reduce carbs and sweets in diet     LIFESTYLE - DECREASE FALLS RISK     Patient Stated     Would like to keep moving and stay out of pain         This is a list of the screening recommended for you and due dates:  Health Maintenance  Topic Date Due   Zoster (Shingles) Vaccine (1 of 2) 09/23/1989   DTaP/Tdap/Td vaccine (2 - Tdap) 11/06/2013   Eye exam for diabetics  08/19/2021   COVID-19 Vaccine (6 - 2023-24 season) 02/16/2022   Flu Shot  09/16/2022   Hemoglobin A1C  01/27/2023   Complete foot exam   03/22/2023   Yearly kidney health urinalysis for diabetes  07/28/2023   Yearly kidney function blood test for diabetes  07/29/2023   Medicare Annual Wellness Visit  09/14/2023   Pneumonia Vaccine  Completed   DEXA scan (bone density measurement)  Completed   HPV  Vaccine  Aged Out   Hepatitis C Screening  Discontinued    Cardiac Risk Factors include: advanced age (>36men, >26 women);diabetes mellitus;hypertension     Objective:    Today's Vitals   09/14/22 1435  PainSc: 5    There is no height or weight on file to calculate BMI.     04/22/2022    1:34 PM 04/21/2022    2:36 PM 04/21/2022    2:00 PM 04/20/2022   11:10 AM 11/10/2021    7:01 PM 09/11/2021    2:03 PM 07/02/2021   10:13 AM  Advanced Directives  Does Patient Have a Medical Advance Directive? Yes Yes  Yes Yes Yes Yes  Type of Sales promotion account executive of [employer] Power of [place];Living will;Out of facility DNR (pink MOST or yellow form) Healthcare Power of [place];Living will;Out of facility DNR (pink MOST or yellow form)   Does patient want to make changes to medical advance directive?  No - Patient declined No - Patient declined No - Patient declined No - Guardian declined  No - Patient declined  Copy of Healthcare Power of Attorney in Chart?     Yes - validated most recent copy scanned in chart (See row information) No - copy requested   Pre-existing out of facility DNR  order (yellow form or pink MOST form)     Pink MOST/Yellow Form most recent copy in chart - Physician notified to receive inpatient order      Current Medications (verified) Outpatient Encounter Medications as of 09/14/2022  Medication Sig   acetaminophen (TYLENOL) 500 MG tablet Take one tablet two times daily for chronic pain (Patient taking differently: Take 500 mg by mouth in the morning and at bedtime. for chronic pain)   albuterol (VENTOLIN HFA) 108 (90 Base) MCG/ACT inhaler Inhale 2 puffs into the lungs every 6 (six) hours as needed for wheezing or shortness of breath.   amLODipine (NORVASC) 10 MG tablet TAKE ONE TABLET BY MOUTH ONCE DAILY   atorvastatin (LIPITOR) 40 MG tablet TAKE 1 TABLET BY MOUTH ONCE DAILY.   azelastine (OPTIVAR) 0.05 % ophthalmic solution  Place 1 drop into the right eye 2 (two) times daily.   BD INSULIN SYRINGE U/F 31G X 5/16" 1 ML MISC USE AS DIRECTED   busPIRone (BUSPAR) 7.5 MG tablet TAKE ONE TABLET BY MOUTH THREE TIMES DAILY   cloNIDine (CATAPRES) 0.2 MG tablet Take 1 tablet (0.2 mg total) by mouth 3 (three) times daily.   clotrimazole-betamethasone (LOTRISONE) cream Apply 1 application topically 2 (two) times daily. D/c vusion ointment   clotrimazole-betamethasone (LOTRISONE) cream Apply twice daily to affected areas for 1 week , then as needed   COMBIGAN 0.2-0.5 % ophthalmic solution Place 1 drop into both eyes 2 (two) times daily.   dabigatran (PRADAXA) 150 MG CAPS capsule TAKE ONE CAPSULE BY MOUTH EVERY TWELVE HOURS   docusate sodium (COLACE) 100 MG capsule Take 1 capsule (100 mg total) by mouth 2 (two) times daily.   donepezil (ARICEPT) 10 MG tablet TAKE ONE TABLET BY MOUTH AT BEDTIME   ferrous sulfate 325 (65 FE) MG EC tablet Take 1 tablet (325 mg total) by mouth 2 (two) times daily.   glucose blood (ACCU-CHEK AVIVA PLUS) test strip CHECK BLOOD SUGAR AS INSTRUCTED   glycerin adult 2 g suppository Place 1 suppository rectally as needed for constipation.   hydrOXYzine (ATARAX) 25 MG tablet TAKE ONE CAPSULE BY MOUTH THREE TIMES DAILY   imipramine (TOFRANIL) 50 MG tablet TAKE 2 TABLETS BY MOUTH AT BEDTIME   insulin NPH-regular Human (NOVOLIN 70/30) (70-30) 100 UNIT/ML injection INJECT 55 UNITS INTO THE SKIN TWICE DAILY WITH MEALS   Insulin Pen Needle (ULTICARE MINI PEN NEEDLES) 31G X 6 MM MISC USE TO INJECT NOVOLIN TWICE DAILY.   Insulin Syringe-Needle U-100 32G X 5/16" 1 ML MISC 1 each by Does not apply route in the morning and at bedtime. Use to inject insulin twice daily dx e11.65   meclizine (ANTIVERT) 25 MG tablet Take 1 tablet (25 mg total) by mouth 3 (three) times daily as needed for dizziness.   montelukast (SINGULAIR) 10 MG tablet Take 1 tablet (10 mg total) by mouth at bedtime.   nitrofurantoin,  macrocrystal-monohydrate, (MACROBID) 100 MG capsule Take 1 capsule (100 mg total) by mouth 2 (two) times daily.   nystatin (MYCOSTATIN/NYSTOP) powder APPLY TOPCIALLY TO RASH UNDER BREASTS ONCE DAILY AS NEEDED.   olopatadine (PATANOL) 0.1 % ophthalmic solution Place 1 drop into both eyes 2 (two) times daily.   oxyCODONE-acetaminophen (PERCOCET) 10-325 MG tablet Take one tablet by mouth two times daily for pain   [START ON 09/25/2022] oxyCODONE-acetaminophen (PERCOCET) 10-325 MG tablet Take one tablet by mouth two times daily for pain   [START ON 10/25/2022] oxyCODONE-acetaminophen (PERCOCET) 10-325 MG tablet Take one tablet by  mouth two times daily for pain   pantoprazole (PROTONIX) 40 MG tablet TAKE ONE TABLET BY MOUTH TWICE DAILY   SUMAtriptan (IMITREX) 25 MG tablet TAKE 1 TABLET BY MOUTH AS NEEDED FOR MIGRAINE. MAY REPEAT IN 2 HOURS IF HEADACHE PERSISTS OR RECURS.   topiramate (TOPAMAX) 100 MG tablet TAKE 1 TABLET BY MOUTH TWICE DAILY   UNABLE TO FIND Standard wheelchair with elevating leg rests DX M25.561, M54.41   UNABLE TO FIND Skilled Nursing to eval and treat as indicated. Will need routine lab work and vital sign assessments.   UNABLE TO FIND Home Health to evaluate and treat as indicated. Dx: Z74.2   UNABLE TO FIND Med Name: toxassure 8 dx: PAIN MANAGEMENT CONTRACT Z08.89   Vitamin D, Ergocalciferol, (DRISDOL) 1.25 MG (50000 UNIT) CAPS capsule Take 1 CAPSULE BY MOUTH ONCE A WEEK   Facility-Administered Encounter Medications as of 09/14/2022  Medication   fentaNYL (SUBLIMAZE) injection 25-50 mcg    Allergies (verified) Penicillins, Fentanyl, Sulfonamide derivatives, Sulfur, and Codeine   History: Past Medical History:  Diagnosis Date   Anemia, iron deficiency 2012   Evaluated by Dr. Darrick Penna; H&H of 9.3/30.8 with MCV-79 in 10/2010; 3/3 positive Hemoccult cards in 07/2011   Anxiety    was on Prozac for a couple of weeks   Atrial fibrillation (HCC)    takes Coumadin daily   Bruises  easily    takes Coumadin   Chronic anticoagulation 07/21/2010   Chronic back pain    related to knee pain   Degenerative joint disease    Knees   Dementia (HCC)    Depression    Diabetes mellitus    takes Metformin daily   Gastroesophageal reflux disease    takes Omeprazole daily   Glaucoma    Glaucoma    uses eye drops at night   History of blood transfusion 1969   History of gout    was on medication but taken off of several months ago   Hx of colonic polyps    Hx of migraines    last one about a yr ago;takes Topamax daily   Hyperlipidemia    takes Pravastatin daily   Hypertension    takes Hyzaar daily and Propranolol    Insomnia    takes Restoril prn   Joint pain    Joint swelling    Memory loss    takes donepezil   Migraine    Migraine    Nocturia    Overweight(278.02)    Pneumonia 1969   hx of   Pulmonary embolism [facility]) May 2012   acute presentation, bilateral PE   Skin spots-aging    Urinary frequency    Past Surgical History:  Procedure Laterality Date   BIOPSY  04/22/2019   Procedure: BIOPSY;  Surgeon: Corbin Ade, MD;  [facility];  Service: Endoscopy;;  duodenum   BIOPSY  04/22/2022   Procedure: BIOPSY;  Surgeon: Dolores Frame, MD;  [facility];  Service: Gastroenterology;;   CATARACT EXTRACTION W/PHACO Right 04/11/2012   Procedure: CATARACT EXTRACTION PHACO AND INTRAOCULAR LENS PLACEMENT (IOC);  Surgeon: Loraine Leriche T. Nile Riggs, MD;  Location: [facility];  Service: Ophthalmology;  Laterality: Right;  CDE=9.61   CATARACT EXTRACTION W/PHACO Left 12/26/2012   Procedure: CATARACT EXTRACTION PHACO AND INTRAOCULAR LENS PLACEMENT (IOC);  Surgeon: Loraine Leriche T. Nile Riggs, MD;  Location: [facility];  Service: Ophthalmology;  Laterality: Left;  CDE:7.74   COLONOSCOPY  Jan 2002; 2012   2002: Dr. Katrinka Blazing, ext.  hemorrhoids, nl colon; 2012-adenomatous polyps, gastritis on EGD   COLONOSCOPY N/A 04/24/2019   Procedure: COLONOSCOPY;  Surgeon: Malissa Hippo,  MD;  [facility];  Service: Endoscopy;  Laterality: N/A;   CYSTOSCOPY W/ URETERAL STENT PLACEMENT Bilateral 02/25/2018   Procedure: CYSTOSCOPY WITH BILATERAL RETROGRADE PYELOGRAM/BILATERAL URETERAL STENT PLACEMENT, BLADDER BIOPSIES WITH FULGERATION;  Surgeon: Jerilee Field, MD;  Location: [facility];  Service: Urology;  Laterality: Bilateral;   CYSTOSCOPY/URETEROSCOPY/HOLMIUM LASER/STENT PLACEMENT Bilateral 03/21/2018   Procedure: CYSTOSCOPY/URETEROSCOPY/HOLMIUM LASER/STENT PLACEMENT;  Surgeon: Jerilee Field, MD;  [facility];  Service: Urology;  Laterality: Bilateral;  ONLY NEEDS 60 MIN   DILATION AND CURETTAGE OF UTERUS     ESOPHAGOGASTRODUODENOSCOPY  08/24/2011   ESOPHAGOGASTRODUODENOSCOPY; esophageal dilatation; Malissa Hippo, MD;   ESOPHAGOGASTRODUODENOSCOPY N/A 04/22/2019   Procedure: ESOPHAGOGASTRODUODENOSCOPY (EGD);  Surgeon: Corbin Ade, MD;  [facility];  Service: Endoscopy;  Laterality: N/A;   ESOPHAGOGASTRODUODENOSCOPY (EGD) WITH PROPOFOL N/A 04/22/2022   Procedure: ESOPHAGOGASTRODUODENOSCOPY (EGD) WITH PROPOFOL;  Surgeon: Dolores Frame, MD;  [facility];  Service: Gastroenterology;  Laterality: N/A;  with possible esophageal dilation   GIVENS CAPSULE STUDY  08/18/2011   Procedure: GIVENS CAPSULE STUDY;  Surgeon: Malissa Hippo, MD;  [facility];  Service: Endoscopy;  Laterality: N/A;  730   HOT HEMOSTASIS  04/22/2022   Procedure: HOT HEMOSTASIS (ARGON PLASMA COAGULATION/BICAP);  Surgeon: Marguerita Merles, Reuel Boom, MD;  [facility];  Service: Gastroenterology;;   KNEE ARTHROSCOPY W/ MENISCECTOMY  90's   Left   MALONEY DILATION  04/22/2019   Procedure: MALONEY DILATION;  Surgeon: Corbin Ade, MD;  [facility];  Service: Endoscopy;;   ORIF ANKLE FRACTURE Right 90's   TONSILLECTOMY     TOTAL KNEE ARTHROPLASTY  10/20/2011   Procedure: TOTAL KNEE ARTHROPLASTY;  Surgeon: Loreta Ave, MD;  [facility];  Service: Orthopedics;  Laterality: Left;   UPPER GASTROINTESTINAL ENDOSCOPY     URETHRAL DILATION     Family History  Problem Relation Age of Onset   Diabetes Mother    Hypertension Mother    Arthritis Mother    Heart failure Mother    Migraines Mother    Kidney failure Mother    Leukemia Father    Migraines Father    Kidney failure Father    Diabetes Sister    Hypertension Sister    Diabetes Brother    Hypertension Brother    Hypertension Brother    Hypertension Brother    Hypertension Brother    Diabetes Brother    Diabetes Brother    Diabetes Brother    Pulmonary embolism Sister    Migraines Sister    Kidney failure Brother    Colon cancer Neg Hx    Colon polyps Neg Hx    Social History   Socioeconomic History   Marital status: Widowed    Spouse name: Not on file   Number of children: 2   Years of education: 63   Highest education level: Not on file  Occupational History   Occupation: retired     Comment: Systems analyst Office manager)    Employer: RETIRED  Tobacco Use   Smoking status: Never   Smokeless tobacco: Never  Vaping Use   Vaping status: Never Used  Substance and Sexual Activity   Alcohol use: No   Drug use: No   Sexual activity: Not Currently    Birth control/protection: Post-menopausal  Other Topics Concern  Not on file  Social History Narrative   Lives with son.   Right-handed.   1 cup caffeine per day.   Social Determinants of Health   Financial Resource Strain: Low Risk  (09/14/2022)   Overall Financial Resource Strain (CARDIA)    Difficulty of Paying Living Expenses: Not hard at all  Food Insecurity: No Food Insecurity (09/14/2022)   Hunger Vital Sign    Worried About Running Out of Food in the Last Year: Never true    Ran Out of Food in the Last Year: Never true  Transportation Needs: No Transportation Needs (09/14/2022)   PRAPARE - Administrator, Civil Service (Medical): No    Lack of  Transportation (Non-Medical): No  Recent Concern: Transportation Needs - Unmet Transportation Needs (09/14/2022)   PRAPARE - Transportation    Lack of Transportation (Medical): Yes    Lack of Transportation (Non-Medical): Yes  Physical Activity: Inactive (09/14/2022)   Exercise Vital Sign    Days of Exercise per Week: 0 days    Minutes of Exercise per Session: 0 min  Stress: No Stress Concern Present (09/14/2022)   [employer] of Occupational Health - Occupational Stress Questionnaire    Feeling of Stress : Not at all  Social Connections: Socially Isolated (09/14/2022)   Social Connection and Isolation Panel [NHANES]    Frequency of Communication with Friends and Family: More than three times a week    Frequency of Social Gatherings with Friends and Family: More than three times a week    Attends Religious Services: Never    Database administrator or Organizations: No    Attends Banker Meetings: Never    Marital Status: Widowed    Tobacco Counseling Counseling given: Not Answered   Clinical Intake:     Pain Score: 5      BMI - recorded: 39.08 Nutritional Status: BMI > 30  Obese Nutritional Risks: None Diabetes: Yes CBG done?: No Did pt. bring in CBG monitor from home?: No  How often do you need to have someone help you when you read instructions, pamphlets, or other written materials from your doctor or pharmacy?: 1 - Never What is the last grade level you completed in school?: 12th grade  Interpreter Needed?: No      Activities of Daily Living    09/14/2022    2:37 PM 04/20/2022   11:39 AM  In your present state of health, do you have any difficulty performing the following activities:  Hearing? 0   Vision? 0   Difficulty concentrating or making decisions? 0   Walking or climbing stairs? 1   Dressing or bathing? 1   Doing errands, shopping? 1 0  Preparing Food and eating ? Y   Using the Toilet? N   In the past six months, have you  accidently leaked urine? Y   Do you have problems with loss of bowel control? N   Managing your Medications? Y   Managing your Finances? Y   Housekeeping or managing your Housekeeping? Y     Patient Care Team: Kerri Perches, MD as PCP - General (Family Medicine) Wyline Mood, Dorothe Pea, MD as PCP - Cardiology (Cardiology) Malissa Hippo, MD (Inactive) (Gastroenterology) Sheral Apley, MD as Attending Physician (Orthopedic Surgery)  Indicate any recent Medical Services you may have received from other than [facility] providers in the past year (date may be approximate).     Assessment:   This is a routine wellness examination  for [place].  Hearing/Vision screen No results found.  Dietary issues and exercise activities discussed:     Goals Addressed             This Visit's Progress    LIFESTYLE - DECREASE FALLS RISK   On track    Patient Stated   On track    Would like to keep moving and stay out of pain       Depression Screen    09/14/2022    2:42 PM 09/14/2022    2:41 PM 07/28/2022    1:53 PM 04/27/2022   10:30 AM 10/30/2021   11:40 AM 09/11/2021    2:04 PM 09/11/2021    2:00 PM  PHQ 2/9 Scores  PHQ - 2 Score 2 2 2 2  0 0 0  PHQ- 9 Score   6 12       Fall Risk    09/14/2022    2:41 PM 07/28/2022    1:53 PM 04/27/2022   10:30 AM 03/18/2022    3:44 PM 10/30/2021   11:40 AM  Fall Risk   Falls in the past year? Exclusion - non ambulatory 1 0 1 0  Number falls in past yr:  0 0 1 0  Injury with Fall?  0 0 1 0  Risk for fall due to :  History of fall(s);Impaired balance/gait;Impaired mobility No Fall Risks Impaired mobility;Orthopedic patient;History of fall(s);Impaired balance/gait No Fall Risks  Follow up  Falls evaluation completed Falls evaluation completed  Falls evaluation completed    MEDICARE RISK AT HOME:  Medicare Risk at Home - 09/14/22 1442     Any stairs in or around the home? Yes    If so, are there any without handrails? No    Home free of loose  throw rugs in walkways, pet beds, electrical cords, etc? Yes    Adequate lighting in your home to reduce risk of falls? Yes    Life alert? Yes    Use of a cane, walker or w/c? Yes    Grab bars in the bathroom? No    Shower chair or bench in shower? No    Elevated toilet seat or a handicapped toilet? Yes             TIMED UP AND GO:  Was the test performed?  No    Cognitive Function:    09/10/2020   11:32 AM 04/23/2014    4:30 PM  MMSE - Mini Mental State Exam  Not completed: Unable to complete   Orientation to time  2  Orientation to Place  2  Registration  0  Attention/ Calculation  0  Recall  0  Language- name 2 objects  2  Language- repeat  1  Language- follow 3 step command  3  Language- read & follow direction  1  Write a sentence  1  Copy design  1  Total score  13        09/11/2021    2:06 PM 09/10/2020   11:32 AM 09/10/2019    2:44 PM 08/09/2018    1:23 PM 08/02/2017    4:23 PM  6CIT Screen  What Year? 0 points -- 0 points 4 points 0 points  What month? 0 points -- 0 points 0 points 0 points  What time? 0 points -- 0 points 0 points 0 points  Count back from 20 2 points -- 0 points 0 points 0 points  Months in reverse 2 points -- 2  points 0 points 0 points  Repeat phrase 0 points -- 2 points 0 points 4 points  Total Score 4 points  4 points 4 points 4 points    Immunizations Immunization History  Administered Date(s) Administered   Fluad Quad(high Dose 65+) 12/04/2018, 11/28/2019, 11/13/2020, 12/22/2021   H1N1 12/19/2007   Influenza Split 11/23/2011   Influenza Whole 01/07/2004, 11/14/2007, 02/03/2009, 11/30/2009   Influenza, High Dose Seasonal PF 12/21/2017   Influenza,inj,Quad PF,6+ Mos 11/01/2012, 01/01/2014, 04/24/2015, 10/29/2015, 01/11/2017   Moderna Covid-19 Vaccine Bivalent Booster 9yrs & up 12/22/2021   Moderna SARS-COV2 Booster Vaccination 11/13/2020   PFIZER(Purple Top)SARS-COV-2 Vaccination 04/12/2019, 04/30/2019, 02/20/2020    Pneumococcal Conjugate-13 04/23/2014   Pneumococcal Polysaccharide-23 08/05/2010   Td 11/07/2003   Zoster, Live 04/12/2006    TDAP status: Due, Education has been provided regarding the importance of this vaccine. Advised may receive this vaccine at local pharmacy or Health Dept. Aware to provide a copy of the vaccination record if obtained from local pharmacy or Health Dept. Verbalized acceptance and understanding.  Flu Vaccine status: Up to date  Pneumococcal vaccine status: Up to date  Covid-19 vaccine status: Completed vaccines  Qualifies for Shingles Vaccine? Yes   Zostavax completed No   Shingrix Completed?: Yes  Screening Tests Health Maintenance  Topic Date Due   Zoster Vaccines- Shingrix (1 of 2) 09/23/1989   DTaP/Tdap/Td (2 - Tdap) 11/06/2013   OPHTHALMOLOGY EXAM  08/19/2021   COVID-19 Vaccine (6 - 2023-24 season) 02/16/2022   INFLUENZA VACCINE  09/16/2022   HEMOGLOBIN A1C  01/27/2023   FOOT EXAM  03/22/2023   Diabetic kidney evaluation - Urine ACR  07/28/2023   Diabetic kidney evaluation - eGFR measurement  07/29/2023   Medicare Annual Wellness (AWV)  09/14/2023   Pneumonia Vaccine 39+ Years old  Completed   DEXA SCAN  Completed   HPV VACCINES  Aged Out   Hepatitis C Screening  Discontinued    Health Maintenance  Health Maintenance Due  Topic Date Due   Zoster Vaccines- Shingrix (1 of 2) 09/23/1989   DTaP/Tdap/Td (2 - Tdap) 11/06/2013   OPHTHALMOLOGY EXAM  08/19/2021   COVID-19 Vaccine (6 - 2023-24 season) 02/16/2022    Colorectal cancer screening: No longer required.   Mammogram status: No longer required due to age.    Lung Cancer Screening: (Low Dose CT Chest recommended if Age 59-80 years, 20 pack-year currently smoking OR have quit w/in 15years.) does not qualify.   Lung Cancer Screening Referral:   Additional Screening:  Hepatitis C Screening: does qualify; Completed   Vision Screening: Recommended annual ophthalmology exams for early  detection of glaucoma and other disorders of the eye. Is the patient up to date with their annual eye exam?  No  Who is the provider or what is the name of the office in which the patient attends annual eye exams? [facility] If pt is not established with a provider, would they like to be referred to a provider to establish care? No .   Dental Screening: Recommended annual dental exams for proper oral hygiene  Diabetic Foot Exam: Diabetic Foot Exam: Completed 03/21/2022  Community Resource Referral / Chronic Care Management: CRR required this visit?  No   CCM required this visit?  No     Plan:     I have personally reviewed and noted the following in the patient's chart:   Medical and social history Use of alcohol, tobacco or illicit drugs  Current medications and supplements including opioid prescriptions.  Patient is currently taking opioid prescriptions. Information provided to patient regarding non-opioid alternatives. Patient advised to discuss non-opioid treatment plan with their provider. Functional ability and status Nutritional status Physical activity Advanced directives List of other physicians Hospitalizations, surgeries, and ER visits in previous 12 months Vitals Screenings to include cognitive, depression, and falls Referrals and appointments  In addition, I have reviewed and discussed with patient certain preventive protocols, quality metrics, and best practice recommendations. A written personalized care plan for preventive services as well as general preventive health recommendations were provided to patient.     Telford Nab, CMA   09/14/2022   After Visit Summary: (Mail) Due to this being a telephonic visit, the after visit summary with patients personalized plan was offered to patient via mail   Nurse Notes:  Ms. Hennecke , Thank you for taking time to come for your Medicare Wellness Visit. I appreciate your ongoing commitment to your health goals.  Please review the following plan we discussed and let me know if I can assist you in the future.   These are the goals we discussed:  Goals      Blood Pressure < 140/90     HEMOGLOBIN A1C < 7     Reduce carbs and sweets in diet     LIFESTYLE - DECREASE FALLS RISK     Patient Stated     Would like to keep moving and stay out of pain         This is a list of the screening recommended for you and due dates:  Health Maintenance  Topic Date Due   Zoster (Shingles) Vaccine (1 of 2) 09/23/1989   DTaP/Tdap/Td vaccine (2 - Tdap) 11/06/2013   Eye exam for diabetics  08/19/2021   COVID-19 Vaccine (6 - 2023-24 season) 02/16/2022   Flu Shot  09/16/2022   Hemoglobin A1C  01/27/2023   Complete foot exam   03/22/2023   Yearly kidney health urinalysis for diabetes  07/28/2023   Yearly kidney function blood test for diabetes  07/29/2023   Medicare Annual Wellness Visit  09/14/2023   Pneumonia Vaccine  Completed   DEXA scan (bone density measurement)  Completed   HPV Vaccine  Aged Out   Hepatitis C Screening  Discontinued

## 2022-09-15 ENCOUNTER — Telehealth: Payer: Self-pay | Admitting: *Deleted

## 2022-09-15 ENCOUNTER — Ambulatory Visit: Payer: Medicare HMO

## 2022-09-15 ENCOUNTER — Other Ambulatory Visit: Payer: Self-pay | Admitting: Family Medicine

## 2022-09-15 NOTE — Progress Notes (Signed)
  Care Coordination   Note   09/15/2022 Name: NAIYANA TIBBETT MRN: [id number] DOB: 08/03/1940  ANAYELI FELICIANO is a 82 y.o. year old female who sees Lodema Hong, Milus Mallick, MD for primary care. I reached out to [name] by phone today to offer care coordination services.  Ms. Phillip was given information about Care Coordination services today including:   The Care Coordination services include support from the care team which includes your Nurse Coordinator, Clinical Social Worker, or Pharmacist.  The Care Coordination team is here to help remove barriers to the health concerns and goals most important to you. Care Coordination services are voluntary, and the patient may decline or stop services at any time by request to their care team member.   Care Coordination Consent Status: Patient sister Kerrin Champagne DPR on file agreed to services and verbal consent obtained.   Follow up plan:  Telephone appointment with care coordination team member scheduled for:  09/15/22  Encounter Outcome:  Pt. Scheduled  [facility] Coordination Care Guide  Direct Dial: [phone]

## 2022-09-15 NOTE — Patient Outreach (Signed)
  Care Coordination   09/15/2022 Name: [name] MRN: [id number] DOB: February 15, 1941   Care Coordination Outreach Attempts:  An unsuccessful telephone outreach was attempted for a scheduled appointment today.  Follow Up Plan:  No further outreach attempts will be made at this time. We have been unable to contact the patient to offer or enroll patient in care coordination services  Encounter Outcome:  No Answer   Care Coordination Interventions:  No, not indicated    SIG Lysle Morales, BSW Social Worker [facility] Care Management  [phone]

## 2022-09-22 ENCOUNTER — Ambulatory Visit: Payer: Self-pay

## 2022-09-22 ENCOUNTER — Telehealth: Payer: Self-pay

## 2022-09-22 NOTE — Patient Outreach (Signed)
  Care Coordination   Initial Visit Note   09/22/2022 Name: [name] MRN: [id number] DOB: 1940/10/14  [name] is a 82 y.o. year old female who sees Lodema Hong, Milus Mallick, MD for primary care. I spoke with  [name] sister Kerrin Champagne by phone today.  What matters to the patients health and wellness today?  Patients sister Jola Babinski reports that [facility] assistance is not needed at this time.  The family is providing transportation. [name] agreed to contact provider in the future should patient need additional support.   Goals Addressed   None     SDOH assessments and interventions completed:  No     Care Coordination Interventions:  No, not indicated   Follow up plan: No further intervention required.   Encounter Outcome:  Pt. Refused

## 2022-09-22 NOTE — Telephone Encounter (Signed)
Spoke with patients sister she stated when patient was in the hospital they switched her to protonix and she would like to go back to omeprazole what she was on before also [employer] does not have her novolin 70/30 insulin ok to send to [facility] in [place] ?

## 2022-09-24 ENCOUNTER — Other Ambulatory Visit: Payer: Self-pay

## 2022-09-24 MED ORDER — NOVOLIN 70/30 (70-30) 100 UNIT/ML ~~LOC~~ SUSP: SUBCUTANEOUS | 5 refills | Status: DC

## 2022-09-24 MED ORDER — OMEPRAZOLE 40 MG PO CPDR: DELAYED_RELEASE_CAPSULE | ORAL | 5 refills | Status: DC

## 2022-09-24 NOTE — Telephone Encounter (Signed)
Omeprazole 40 mg 1 capsule by mouth twice daily

## 2022-09-24 NOTE — Addendum Note (Signed)
Addended by: Kerri Perches on: 09/24/2022 03:38 PM   Modules accepted: Orders

## 2022-09-27 ENCOUNTER — Other Ambulatory Visit: Payer: Self-pay | Admitting: Family Medicine

## 2022-09-29 ENCOUNTER — Other Ambulatory Visit: Payer: Self-pay | Admitting: Family Medicine

## 2022-10-26 ENCOUNTER — Other Ambulatory Visit: Payer: Self-pay

## 2022-10-26 MED ORDER — DOCUSATE SODIUM 100 MG PO CAPS: 100.0000 mg | ORAL_CAPSULE | Freq: Two times a day (BID) | ORAL | 2 refills | Status: DC

## 2022-11-01 ENCOUNTER — Other Ambulatory Visit: Payer: Self-pay | Admitting: Family Medicine

## 2022-11-22 ENCOUNTER — Other Ambulatory Visit: Payer: Self-pay | Admitting: Family Medicine

## 2022-12-01 ENCOUNTER — Ambulatory Visit (INDEPENDENT_AMBULATORY_CARE_PROVIDER_SITE_OTHER): Payer: Medicare HMO | Admitting: Family Medicine

## 2022-12-01 ENCOUNTER — Encounter: Payer: Self-pay | Admitting: Family Medicine

## 2022-12-01 VITALS — BP 160/98 | HR 99 | Ht 66.0 in

## 2022-12-01 DIAGNOSIS — E1149 Type 2 diabetes mellitus with other diabetic neurological complication: Secondary | ICD-10-CM | POA: Diagnosis not present

## 2022-12-01 DIAGNOSIS — G8929 Other chronic pain: Secondary | ICD-10-CM | POA: Diagnosis not present

## 2022-12-01 DIAGNOSIS — K649 Unspecified hemorrhoids: Secondary | ICD-10-CM | POA: Insufficient documentation

## 2022-12-01 DIAGNOSIS — B369 Superficial mycosis, unspecified: Secondary | ICD-10-CM | POA: Diagnosis not present

## 2022-12-01 DIAGNOSIS — I10 Essential (primary) hypertension: Secondary | ICD-10-CM | POA: Diagnosis not present

## 2022-12-01 DIAGNOSIS — Z86711 Personal history of pulmonary embolism: Secondary | ICD-10-CM

## 2022-12-01 DIAGNOSIS — Z23 Encounter for immunization: Secondary | ICD-10-CM

## 2022-12-01 DIAGNOSIS — M25551 Pain in right hip: Secondary | ICD-10-CM | POA: Diagnosis not present

## 2022-12-01 DIAGNOSIS — K219 Gastro-esophageal reflux disease without esophagitis: Secondary | ICD-10-CM

## 2022-12-01 DIAGNOSIS — M159 Polyosteoarthritis, unspecified: Secondary | ICD-10-CM

## 2022-12-01 DIAGNOSIS — R300 Dysuria: Secondary | ICD-10-CM

## 2022-12-01 DIAGNOSIS — Z0001 Encounter for general adult medical examination with abnormal findings: Secondary | ICD-10-CM | POA: Diagnosis not present

## 2022-12-01 DIAGNOSIS — L98421 Non-pressure chronic ulcer of back limited to breakdown of skin: Secondary | ICD-10-CM

## 2022-12-01 LAB — URINALYSIS
Bilirubin, UA: NEGATIVE
Glucose, UA: NEGATIVE
Ketones, UA: NEGATIVE
Nitrite, UA: NEGATIVE
Specific Gravity, UA: 1.025 (ref 1.005–1.030)
Urobilinogen, Ur: 0.2 mg/dL (ref 0.2–1.0)
pH, UA: 6.5 (ref 5.0–7.5)

## 2022-12-01 MED ORDER — CLOTRIMAZOLE-BETAMETHASONE 1-0.05 % EX CREA
1.0000 | TOPICAL_CREAM | Freq: Two times a day (BID) | CUTANEOUS | 2 refills | Status: DC
Start: 2022-12-01 — End: 2023-01-27

## 2022-12-01 MED ORDER — BISACODYL 5 MG PO TBEC: 10.0000 mg | DELAYED_RELEASE_TABLET | Freq: Every day | ORAL | 5 refills | Status: DC | PRN

## 2022-12-01 MED ORDER — OXYCODONE-ACETAMINOPHEN 10-325 MG PO TABS: ORAL_TABLET | ORAL | 0 refills | Status: DC

## 2022-12-01 MED ORDER — PSYLLIUM 48.57 % PO POWD: ORAL | 11 refills | Status: DC

## 2022-12-01 MED ORDER — HYDROCORTISONE ACETATE 25 MG RE SUPP: 25.0000 mg | Freq: Two times a day (BID) | RECTAL | 3 refills | Status: DC

## 2022-12-01 MED ORDER — NITROFURANTOIN MONOHYD MACRO 100 MG PO CAPS: 100.0000 mg | ORAL_CAPSULE | Freq: Two times a day (BID) | ORAL | 0 refills | Status: DC

## 2022-12-01 MED ORDER — DOCUSATE SODIUM 100 MG PO CAPS: 100.0000 mg | ORAL_CAPSULE | Freq: Every day | ORAL | 11 refills | Status: DC | PRN

## 2022-12-01 MED ORDER — OMEPRAZOLE 40 MG PO CPDR: 40.0000 mg | DELAYED_RELEASE_CAPSULE | Freq: Every day | ORAL | 5 refills | Status: DC

## 2022-12-01 NOTE — Assessment & Plan Note (Signed)
[name] is reminded of the importance of commitment to daily physical activity for 30 minutes or more, as able and the need to limit carbohydrate intake to 30 to 60 grams per meal to help with blood sugar control.   The need to take medication as prescribed, test blood sugar as directed, and to call between visits if there is a concern that blood sugar is uncontrolled is also discussed.   [name] is reminded of the importance of daily foot exam, annual eye examination, and good blood sugar, blood pressure and cholesterol control.     Latest Ref Rng & Units 07/29/2022    6:52 PM 07/28/2022    2:25 PM 04/27/2022   11:47 AM 04/23/2022    5:03 AM 04/22/2022    4:35 AM  Diabetic Labs  HbA1c 4.8 - 5.6 %  6.9      Micro/Creat Ratio 0 - 29 mg/g creat  169      Chol 100 - 199 mg/dL  161      HDL >09 mg/dL  61      Calc LDL 0 - 99 mg/dL  65      Triglycerides 0 - 149 mg/dL  604      Creatinine 5.40 - 1.00 mg/dL 9.81  1.91  4.78  2.95  1.34       12/01/2022    1:58 PM 07/29/2022    6:36 PM 07/28/2022    3:22 PM 07/28/2022    1:54 PM 07/28/2022    1:49 PM 05/27/2022    3:07 PM 04/27/2022   10:29 AM  BP/Weight  Systolic BP 160 153 144 162 180 138 152  Diastolic BP 98 78 82 82 81 82 82  Wt. (Lbs)     -- -- 242  BMI       39.06 kg/m2      Latest Ref Rng & Units 08/19/2020   12:00 AM 11/28/2019    1:40 PM  Foot/eye exam completion dates  Eye Exam No Retinopathy No Retinopathy       Foot Form Completion   Done     This result is from an external source.      Updated lab needed at/ before next visit.

## 2022-12-01 NOTE — Assessment & Plan Note (Signed)
Inreased pain with movement noted by caregiver in past month, obtain X ray

## 2022-12-01 NOTE — Progress Notes (Signed)
[name]     MRN: [id number]      DOB: 1940/05/22  Chief Complaint  Patient presents with   Follow-up    Lower abdomen pain, open wound on buttocks, check for UTI, Change in acid reflux medication, flu shot   Annual Exam    HPI: Patient is in for annual physical exam. And a follow up of her chronic problems  New concerns are addressed per her primary caregiver , her sonin law as stated above C/o hard stool with constipation and hemorrhoids bothering her Labs on day of visit will be reviewed and addressed when available Immunization is reviewed , and  updated   PE: BP (!) 160/98 (BP Location: Right Arm, Patient Position: Sitting, Cuff Size: Large)   Pulse 99   Ht 5\' 6"  (1.676 m)   SpO2 91%   BMI 39.06 kg/m   Pleasant  female, alert in no cardio-pulmonary distress. Afebrile. HEENT No facial trauma or asymetry. Sinuses non tender.  Extra occullar muscles intact.. External ears normal, . Neck: decreased rOM no adenopathy,JVD or thyromegaly.No bruits.  Chest: Clear to ascultation bilaterally.No crackles or wheezes.Decreased air entry Non tender to palpation  Cardiovascular system; Heart sounds normal,  S1 and  S2 ,no S3.  No murmur, Abdomen:  Abdomen: Soft, non tender,  .   Musculoskeletal exam: Markedly reduced  ROM of spine, hips , shoulders and knees.Pain on RLE movement notd  deformity ,swelling and  crepitus noted. .   Neurologic: Cranial nerves 2 to 12 intact. Power,  normal throughout.No focal deficits Wheelchair dependent  No tremor.  Skin: Sacral ulcers x 2 dia 2cm and 1.5 cm stage 1 , limited to skin, no drainage,dermatomycosis  under breasts andin groin  Pigmentation normal throughout  Psych; Normal mood and affect. Judgement and concentration normal   Assessment & Plan:  Annual visit for general adult medical examination with abnormal findings Annual exam as documented. . Immunization  needs are specifically addressed at this  visit.   Dysuria CCUA abn , suggestive of UTI, nitrofurantoin x 5 days and specimen sent for c/s   Type 2 diabetes mellitus with neurological complications (HCC) [name] is reminded of the importance of commitment to daily physical activity for 30 minutes or more, as able and the need to limit carbohydrate intake to 30 to 60 grams per meal to help with blood sugar control.   The need to take medication as prescribed, test blood sugar as directed, and to call between visits if there is a concern that blood sugar is uncontrolled is also discussed.   [name] is reminded of the importance of daily foot exam, annual eye examination, and good blood sugar, blood pressure and cholesterol control.     Latest Ref Rng & Units 07/29/2022    6:52 PM 07/28/2022    2:25 PM 04/27/2022   11:47 AM 04/23/2022    5:03 AM 04/22/2022    4:35 AM  Diabetic Labs  HbA1c 4.8 - 5.6 %  6.9      Micro/Creat Ratio 0 - 29 mg/g creat  169      Chol 100 - 199 mg/dL  166      HDL >06 mg/dL  61      Calc LDL 0 - 99 mg/dL  65      Triglycerides 0 - 149 mg/dL  301      Creatinine 6.01 - 1.00 mg/dL 0.93  2.35  5.73  2.20  1.34  12/01/2022    1:58 PM 07/29/2022    6:36 PM 07/28/2022    3:22 PM 07/28/2022    1:54 PM 07/28/2022    1:49 PM 05/27/2022    3:07 PM 04/27/2022   10:29 AM  BP/Weight  Systolic BP 160 153 144 162 180 138 152  Diastolic BP 98 78 82 82 81 82 82  Wt. (Lbs)     -- -- 242  BMI       39.06 kg/m2      Latest Ref Rng & Units 08/19/2020   12:00 AM 11/28/2019    1:40 PM  Foot/eye exam completion dates  Eye Exam No Retinopathy No Retinopathy       Foot Form Completion   Done     This result is from an external source.      Updated lab needed at/ before next visit.   Essential hypertension Elevated at visit and pt very uncomfortable with BP check, compliance with three times daily clonidine questionable will need to f/u on this DASH diet and commitment to daily physical activity for a  minimum of 30 minutes discussed and encouraged, as a part of hypertension management. The importance of attaining a healthy weight is also discussed.     12/01/2022    1:58 PM 07/29/2022    6:36 PM 07/28/2022    3:22 PM 07/28/2022    1:54 PM 07/28/2022    1:49 PM 05/27/2022    3:07 PM 04/27/2022   10:29 AM  BP/Weight  Systolic BP 160 153 144 162 180 138 152  Diastolic BP 98 78 82 82 81 82 82  Wt. (Lbs)     -- -- 242  BMI       39.06 kg/m2       Generalized osteoarthritis Disabling chronic arthritic pain of all major joints and spine, will benefit from PT/ OT,pt to decide she will get this prior to referral , has refuse in the past  Hemorrhoids Pt to start daily regime of colace, miralax and laxative , dulcolax every 3 days if no BM, also increase water and vegetable intake, and increase activity as able Anusol HC suppositories prescribed for current flare  History of pulmonary embolism Lifelong pradaxa, at increased risk for recurrent PE  Gastroesophageal reflux disease Currently uncontroleed wants to resume prior PPI, had already been prescribed but not being dispensed, re  sent and family is aware  Sacral ulcer, limited to breakdown of skin (HCC) 2 ulcers present , Tegaderm every 3 days till healed and h/h to evaluate and follow  Encounter for chronic pain management The patient's Controlled Substance registry is reviewed and compliance confirmed. Adequacy of  Pain control and level of function is assessed. Medication dosing is adjusted as deemed appropriate. Twelve weeks of medication is prescribed , with a follow up appointment between 11 to 12 weeks .   Chronic hip pain, right Inreased pain with movement noted by caregiver in past month, obtain X ray  Dermatomycosis Clotrimazole/betameth cream twice daily to affected areas as needed, under breasts and in groin

## 2022-12-01 NOTE — Assessment & Plan Note (Signed)
Currently uncontroleed wants to resume prior PPI, had already been prescribed but not being dispensed, re  sent and family is aware

## 2022-12-01 NOTE — Assessment & Plan Note (Signed)
Lifelong pradaxa, at increased risk for recurrent PE

## 2022-12-01 NOTE — Assessment & Plan Note (Signed)
Annual exam as documented.  Immunization needs are specifically addressed at this visit.

## 2022-12-01 NOTE — Assessment & Plan Note (Signed)
Clotrimazole/betameth cream twice daily to affected areas as needed, under breasts and in groin

## 2022-12-01 NOTE — Patient Instructions (Addendum)
F/U in 14 weeks, call idf you need me sooner  Flu vaccine today  You need TdAPand Covid vaccines from your pharmacy  Labs today HBA1C, cmp and EGFR, tSH  For constipation, daily stool softeners, miralax and every 3 days if needed, dulcolax  tablet  For hemmorhoids ,  keep stool soft so you do not strain, and suppository is prescribed for current flare  Refer H/H to evaluate and follow sacral ulcer, refer for X ray of right hip due to pain  X ray through H/H, mobile, X ay right hip , due to pain, nurse pls arrange  Refer for pT/OT twic e weekly for 6 weeks IF PT AGREES/WANTS it , nurse pls verify and order if she wants it  Tegaderm to sore every 3 days  till healed  Clotrimazole cream prescribed for rash under lower abdomen and breasts  You appear to have a UTI, nitrofurantoin prescribed for 5 days  ,Thanks for choosing [facility], we consider it a privelige to serve you.

## 2022-12-01 NOTE — Assessment & Plan Note (Signed)
CCUA abn , suggestive of UTI, nitrofurantoin x 5 days and specimen sent for c/s

## 2022-12-01 NOTE — Assessment & Plan Note (Addendum)
Elevated at visit and pt very uncomfortable with BP check, compliance with three times daily clonidine questionable will need to f/u on this DASH diet and commitment to daily physical activity for a minimum of 30 minutes discussed and encouraged, as a part of hypertension management. The importance of attaining a healthy weight is also discussed.     12/01/2022    1:58 PM 07/29/2022    6:36 PM 07/28/2022    3:22 PM 07/28/2022    1:54 PM 07/28/2022    1:49 PM 05/27/2022    3:07 PM 04/27/2022   10:29 AM  BP/Weight  Systolic BP 160 153 144 162 180 138 152  Diastolic BP 98 78 82 82 81 82 82  Wt. (Lbs)     -- -- 242  BMI       39.06 kg/m2

## 2022-12-01 NOTE — Assessment & Plan Note (Signed)
2 ulcers present , Tegaderm every 3 days till healed and h/h to evaluate and follow

## 2022-12-01 NOTE — Assessment & Plan Note (Signed)
Disabling chronic arthritic pain of all major joints and spine, will benefit from PT/ OT,pt to decide she will get this prior to referral , has refuse in the past

## 2022-12-01 NOTE — Assessment & Plan Note (Signed)
Pt to start daily regime of colace, miralax and laxative , dulcolax every 3 days if no BM, also increase water and vegetable intake, and increase activity as able Anusol HC suppositories prescribed for current flare

## 2022-12-01 NOTE — Assessment & Plan Note (Signed)
The patient's Controlled Substance registry is reviewed and compliance confirmed. Adequacy of  Pain control and level of function is assessed. Medication dosing is adjusted as deemed appropriate. Twelve weeks of medication is prescribed , with a follow up appointment between 11 to 12 weeks .

## 2022-12-02 ENCOUNTER — Other Ambulatory Visit: Payer: Self-pay | Admitting: Family Medicine

## 2022-12-02 LAB — HEMOGLOBIN A1C
Est. average glucose Bld gHb Est-mCnc: 157 mg/dL
Hgb A1c MFr Bld: 7.1 % — ABNORMAL HIGH (ref 4.8–5.6)

## 2022-12-02 LAB — CMP14+EGFR
ALT: 10 [IU]/L (ref 0–32)
AST: 12 [IU]/L (ref 0–40)
Albumin: 4 g/dL (ref 3.7–4.7)
Alkaline Phosphatase: 132 [IU]/L — ABNORMAL HIGH (ref 44–121)
BUN/Creatinine Ratio: 12 (ref 12–28)
BUN: 17 mg/dL (ref 8–27)
Bilirubin Total: <0.2 mg/dL (ref 0.0–1.2)
CO2: 24 mmol/L (ref 20–29)
Calcium: 9.4 mg/dL (ref 8.7–10.3)
Chloride: 102 mmol/L (ref 96–106)
Creatinine, Ser: 1.46 mg/dL — ABNORMAL HIGH (ref 0.57–1.00)
Globulin, Total: 3.3 g/dL (ref 1.5–4.5)
Glucose: 127 mg/dL — ABNORMAL HIGH (ref 70–99)
Potassium: 4.5 mmol/L (ref 3.5–5.2)
Sodium: 140 mmol/L (ref 134–144)
Total Protein: 7.3 g/dL (ref 6.0–8.5)
eGFR: 35 mL/min/{1.73_m2} — ABNORMAL LOW (ref >59)

## 2022-12-02 LAB — TSH: TSH: 2.59 u[IU]/mL (ref 0.450–4.500)

## 2022-12-03 ENCOUNTER — Other Ambulatory Visit: Payer: Self-pay

## 2022-12-03 DIAGNOSIS — G8929 Other chronic pain: Secondary | ICD-10-CM

## 2022-12-03 DIAGNOSIS — L98421 Non-pressure chronic ulcer of back limited to breakdown of skin: Secondary | ICD-10-CM

## 2022-12-04 LAB — URINE CULTURE

## 2022-12-07 DIAGNOSIS — K649 Unspecified hemorrhoids: Secondary | ICD-10-CM | POA: Diagnosis not present

## 2022-12-07 DIAGNOSIS — M47815 Spondylosis without myelopathy or radiculopathy, thoracolumbar region: Secondary | ICD-10-CM | POA: Diagnosis not present

## 2022-12-07 DIAGNOSIS — B369 Superficial mycosis, unspecified: Secondary | ICD-10-CM | POA: Diagnosis not present

## 2022-12-07 DIAGNOSIS — G8929 Other chronic pain: Secondary | ICD-10-CM | POA: Diagnosis not present

## 2022-12-07 DIAGNOSIS — L89152 Pressure ulcer of sacral region, stage 2: Secondary | ICD-10-CM | POA: Diagnosis not present

## 2022-12-07 DIAGNOSIS — M25551 Pain in right hip: Secondary | ICD-10-CM | POA: Diagnosis not present

## 2022-12-07 DIAGNOSIS — E1149 Type 2 diabetes mellitus with other diabetic neurological complication: Secondary | ICD-10-CM | POA: Diagnosis not present

## 2022-12-07 DIAGNOSIS — I10 Essential (primary) hypertension: Secondary | ICD-10-CM | POA: Diagnosis not present

## 2022-12-07 DIAGNOSIS — M150 Primary generalized (osteo)arthritis: Secondary | ICD-10-CM | POA: Diagnosis not present

## 2022-12-14 DIAGNOSIS — G8929 Other chronic pain: Secondary | ICD-10-CM | POA: Diagnosis not present

## 2022-12-14 DIAGNOSIS — K649 Unspecified hemorrhoids: Secondary | ICD-10-CM | POA: Diagnosis not present

## 2022-12-14 DIAGNOSIS — M150 Primary generalized (osteo)arthritis: Secondary | ICD-10-CM | POA: Diagnosis not present

## 2022-12-14 DIAGNOSIS — M47815 Spondylosis without myelopathy or radiculopathy, thoracolumbar region: Secondary | ICD-10-CM | POA: Diagnosis not present

## 2022-12-14 DIAGNOSIS — L89152 Pressure ulcer of sacral region, stage 2: Secondary | ICD-10-CM | POA: Diagnosis not present

## 2022-12-14 DIAGNOSIS — I10 Essential (primary) hypertension: Secondary | ICD-10-CM | POA: Diagnosis not present

## 2022-12-14 DIAGNOSIS — E1149 Type 2 diabetes mellitus with other diabetic neurological complication: Secondary | ICD-10-CM | POA: Diagnosis not present

## 2022-12-14 DIAGNOSIS — M25551 Pain in right hip: Secondary | ICD-10-CM | POA: Diagnosis not present

## 2022-12-14 DIAGNOSIS — B369 Superficial mycosis, unspecified: Secondary | ICD-10-CM | POA: Diagnosis not present

## 2022-12-15 ENCOUNTER — Telehealth: Payer: Self-pay | Admitting: Family Medicine

## 2022-12-15 DIAGNOSIS — M47815 Spondylosis without myelopathy or radiculopathy, thoracolumbar region: Secondary | ICD-10-CM | POA: Diagnosis not present

## 2022-12-15 DIAGNOSIS — M47817 Spondylosis without myelopathy or radiculopathy, lumbosacral region: Secondary | ICD-10-CM

## 2022-12-15 DIAGNOSIS — I10 Essential (primary) hypertension: Secondary | ICD-10-CM | POA: Diagnosis not present

## 2022-12-15 DIAGNOSIS — M25551 Pain in right hip: Secondary | ICD-10-CM | POA: Diagnosis not present

## 2022-12-15 DIAGNOSIS — M47816 Spondylosis without myelopathy or radiculopathy, lumbar region: Secondary | ICD-10-CM

## 2022-12-15 DIAGNOSIS — L89152 Pressure ulcer of sacral region, stage 2: Secondary | ICD-10-CM | POA: Diagnosis not present

## 2022-12-15 DIAGNOSIS — M47814 Spondylosis without myelopathy or radiculopathy, thoracic region: Secondary | ICD-10-CM

## 2022-12-15 DIAGNOSIS — K649 Unspecified hemorrhoids: Secondary | ICD-10-CM | POA: Diagnosis not present

## 2022-12-15 DIAGNOSIS — G8929 Other chronic pain: Secondary | ICD-10-CM | POA: Diagnosis not present

## 2022-12-15 DIAGNOSIS — E1149 Type 2 diabetes mellitus with other diabetic neurological complication: Secondary | ICD-10-CM | POA: Diagnosis not present

## 2022-12-15 DIAGNOSIS — M150 Primary generalized (osteo)arthritis: Secondary | ICD-10-CM | POA: Diagnosis not present

## 2022-12-15 DIAGNOSIS — B369 Superficial mycosis, unspecified: Secondary | ICD-10-CM | POA: Diagnosis not present

## 2022-12-15 NOTE — Telephone Encounter (Signed)
Called # p PT, [name]  answered , feels she qualifies for CGM, states that both pt and her son want it also  (does qualify as on insulin)  Hoyer lift , as unable to transfer , needs max assist   OK doing PT/OT weekly for 8 weeks   I explained to Provider that all of this cann and should  in the future be relayed to nurse to get things moving   Please enter orders as above, CGN, hoer lift and pT/oT  order, weekly x 8 weeks  ?? Or concerns pls ask

## 2022-12-15 NOTE — Telephone Encounter (Signed)
Cindy called [facility] need to discuss a hoyer lift and plan of care with the provider. Call back # [phone]

## 2022-12-21 DIAGNOSIS — I10 Essential (primary) hypertension: Secondary | ICD-10-CM | POA: Diagnosis not present

## 2022-12-21 DIAGNOSIS — B369 Superficial mycosis, unspecified: Secondary | ICD-10-CM | POA: Diagnosis not present

## 2022-12-21 DIAGNOSIS — M47815 Spondylosis without myelopathy or radiculopathy, thoracolumbar region: Secondary | ICD-10-CM | POA: Diagnosis not present

## 2022-12-21 DIAGNOSIS — M150 Primary generalized (osteo)arthritis: Secondary | ICD-10-CM | POA: Diagnosis not present

## 2022-12-21 DIAGNOSIS — K649 Unspecified hemorrhoids: Secondary | ICD-10-CM | POA: Diagnosis not present

## 2022-12-21 DIAGNOSIS — L89152 Pressure ulcer of sacral region, stage 2: Secondary | ICD-10-CM | POA: Diagnosis not present

## 2022-12-21 DIAGNOSIS — G8929 Other chronic pain: Secondary | ICD-10-CM | POA: Diagnosis not present

## 2022-12-21 DIAGNOSIS — M25551 Pain in right hip: Secondary | ICD-10-CM | POA: Diagnosis not present

## 2022-12-21 DIAGNOSIS — E1149 Type 2 diabetes mellitus with other diabetic neurological complication: Secondary | ICD-10-CM | POA: Diagnosis not present

## 2022-12-23 DIAGNOSIS — E1149 Type 2 diabetes mellitus with other diabetic neurological complication: Secondary | ICD-10-CM | POA: Diagnosis not present

## 2022-12-23 DIAGNOSIS — I10 Essential (primary) hypertension: Secondary | ICD-10-CM | POA: Diagnosis not present

## 2022-12-23 DIAGNOSIS — G8929 Other chronic pain: Secondary | ICD-10-CM | POA: Diagnosis not present

## 2022-12-23 DIAGNOSIS — M25551 Pain in right hip: Secondary | ICD-10-CM | POA: Diagnosis not present

## 2022-12-23 DIAGNOSIS — M47815 Spondylosis without myelopathy or radiculopathy, thoracolumbar region: Secondary | ICD-10-CM | POA: Diagnosis not present

## 2022-12-23 DIAGNOSIS — B369 Superficial mycosis, unspecified: Secondary | ICD-10-CM | POA: Diagnosis not present

## 2022-12-23 DIAGNOSIS — M150 Primary generalized (osteo)arthritis: Secondary | ICD-10-CM | POA: Diagnosis not present

## 2022-12-23 DIAGNOSIS — L89152 Pressure ulcer of sacral region, stage 2: Secondary | ICD-10-CM | POA: Diagnosis not present

## 2022-12-23 DIAGNOSIS — K649 Unspecified hemorrhoids: Secondary | ICD-10-CM | POA: Diagnosis not present

## 2022-12-24 ENCOUNTER — Telehealth: Payer: Self-pay | Admitting: Family Medicine

## 2022-12-24 NOTE — Telephone Encounter (Signed)
Copied from CRM [id number]. Topic: Clinical - Home Health Verbal Orders >> Dec 24, 2022 10:25 AM Eunice Blase wrote: Caller/Agency: [facility] Callback Number: [phone] Service Requested: Home Health Frequency:  Any new concerns about the patient? Yes

## 2022-12-27 ENCOUNTER — Other Ambulatory Visit: Payer: Self-pay | Admitting: Family Medicine

## 2022-12-28 DIAGNOSIS — G8929 Other chronic pain: Secondary | ICD-10-CM | POA: Diagnosis not present

## 2022-12-28 DIAGNOSIS — L89152 Pressure ulcer of sacral region, stage 2: Secondary | ICD-10-CM | POA: Diagnosis not present

## 2022-12-28 DIAGNOSIS — E1149 Type 2 diabetes mellitus with other diabetic neurological complication: Secondary | ICD-10-CM | POA: Diagnosis not present

## 2022-12-28 DIAGNOSIS — K649 Unspecified hemorrhoids: Secondary | ICD-10-CM | POA: Diagnosis not present

## 2022-12-28 DIAGNOSIS — M47815 Spondylosis without myelopathy or radiculopathy, thoracolumbar region: Secondary | ICD-10-CM | POA: Diagnosis not present

## 2022-12-28 DIAGNOSIS — I10 Essential (primary) hypertension: Secondary | ICD-10-CM | POA: Diagnosis not present

## 2022-12-28 DIAGNOSIS — M25551 Pain in right hip: Secondary | ICD-10-CM | POA: Diagnosis not present

## 2022-12-28 DIAGNOSIS — B369 Superficial mycosis, unspecified: Secondary | ICD-10-CM | POA: Diagnosis not present

## 2022-12-28 DIAGNOSIS — M150 Primary generalized (osteo)arthritis: Secondary | ICD-10-CM | POA: Diagnosis not present

## 2022-12-30 DIAGNOSIS — K649 Unspecified hemorrhoids: Secondary | ICD-10-CM | POA: Diagnosis not present

## 2022-12-30 DIAGNOSIS — B369 Superficial mycosis, unspecified: Secondary | ICD-10-CM | POA: Diagnosis not present

## 2022-12-30 DIAGNOSIS — L89152 Pressure ulcer of sacral region, stage 2: Secondary | ICD-10-CM | POA: Diagnosis not present

## 2022-12-30 DIAGNOSIS — M150 Primary generalized (osteo)arthritis: Secondary | ICD-10-CM | POA: Diagnosis not present

## 2022-12-30 DIAGNOSIS — M47815 Spondylosis without myelopathy or radiculopathy, thoracolumbar region: Secondary | ICD-10-CM | POA: Diagnosis not present

## 2022-12-30 DIAGNOSIS — I10 Essential (primary) hypertension: Secondary | ICD-10-CM | POA: Diagnosis not present

## 2022-12-30 DIAGNOSIS — E1149 Type 2 diabetes mellitus with other diabetic neurological complication: Secondary | ICD-10-CM | POA: Diagnosis not present

## 2022-12-30 DIAGNOSIS — M25551 Pain in right hip: Secondary | ICD-10-CM | POA: Diagnosis not present

## 2022-12-30 DIAGNOSIS — G8929 Other chronic pain: Secondary | ICD-10-CM | POA: Diagnosis not present

## 2022-12-31 ENCOUNTER — Other Ambulatory Visit: Payer: Self-pay

## 2022-12-31 MED ORDER — DEXCOM G7 RECEIVER DEVI: 3 refills | Status: DC

## 2022-12-31 MED ORDER — DEXCOM G7 SENSOR MISC: 3 refills | Status: DC

## 2022-12-31 NOTE — Telephone Encounter (Signed)
CGM dexcom sent to patients pharmacy as requested

## 2022-12-31 NOTE — Telephone Encounter (Signed)
Order for hoyer lift send thru [employer] [facility]

## 2023-01-06 DIAGNOSIS — G8929 Other chronic pain: Secondary | ICD-10-CM | POA: Diagnosis not present

## 2023-01-06 DIAGNOSIS — B369 Superficial mycosis, unspecified: Secondary | ICD-10-CM | POA: Diagnosis not present

## 2023-01-06 DIAGNOSIS — L89152 Pressure ulcer of sacral region, stage 2: Secondary | ICD-10-CM | POA: Diagnosis not present

## 2023-01-06 DIAGNOSIS — I10 Essential (primary) hypertension: Secondary | ICD-10-CM | POA: Diagnosis not present

## 2023-01-06 DIAGNOSIS — K649 Unspecified hemorrhoids: Secondary | ICD-10-CM | POA: Diagnosis not present

## 2023-01-06 DIAGNOSIS — M47815 Spondylosis without myelopathy or radiculopathy, thoracolumbar region: Secondary | ICD-10-CM | POA: Diagnosis not present

## 2023-01-06 DIAGNOSIS — E1149 Type 2 diabetes mellitus with other diabetic neurological complication: Secondary | ICD-10-CM | POA: Diagnosis not present

## 2023-01-06 DIAGNOSIS — M25551 Pain in right hip: Secondary | ICD-10-CM | POA: Diagnosis not present

## 2023-01-06 DIAGNOSIS — M150 Primary generalized (osteo)arthritis: Secondary | ICD-10-CM | POA: Diagnosis not present

## 2023-01-08 ENCOUNTER — Other Ambulatory Visit: Payer: Self-pay | Admitting: Family Medicine

## 2023-01-11 DIAGNOSIS — M150 Primary generalized (osteo)arthritis: Secondary | ICD-10-CM | POA: Diagnosis not present

## 2023-01-11 DIAGNOSIS — L89152 Pressure ulcer of sacral region, stage 2: Secondary | ICD-10-CM | POA: Diagnosis not present

## 2023-01-11 DIAGNOSIS — G8929 Other chronic pain: Secondary | ICD-10-CM | POA: Diagnosis not present

## 2023-01-11 DIAGNOSIS — B369 Superficial mycosis, unspecified: Secondary | ICD-10-CM | POA: Diagnosis not present

## 2023-01-11 DIAGNOSIS — K649 Unspecified hemorrhoids: Secondary | ICD-10-CM | POA: Diagnosis not present

## 2023-01-11 DIAGNOSIS — I10 Essential (primary) hypertension: Secondary | ICD-10-CM | POA: Diagnosis not present

## 2023-01-11 DIAGNOSIS — M47815 Spondylosis without myelopathy or radiculopathy, thoracolumbar region: Secondary | ICD-10-CM | POA: Diagnosis not present

## 2023-01-11 DIAGNOSIS — M25551 Pain in right hip: Secondary | ICD-10-CM | POA: Diagnosis not present

## 2023-01-11 DIAGNOSIS — E1149 Type 2 diabetes mellitus with other diabetic neurological complication: Secondary | ICD-10-CM | POA: Diagnosis not present

## 2023-01-14 ENCOUNTER — Emergency Department (HOSPITAL_COMMUNITY): Payer: Medicare HMO

## 2023-01-14 ENCOUNTER — Encounter (HOSPITAL_COMMUNITY): Payer: Self-pay | Admitting: Emergency Medicine

## 2023-01-14 ENCOUNTER — Other Ambulatory Visit: Payer: Self-pay

## 2023-01-14 ENCOUNTER — Emergency Department (HOSPITAL_COMMUNITY)
Admission: EM | Admit: 2023-01-14 | Discharge: 2023-01-19 | Disposition: A | Discharge status: Skilled Nursing Facility | Payer: Medicare HMO | Attending: Emergency Medicine | Admitting: Emergency Medicine

## 2023-01-14 DIAGNOSIS — S0990XA Unspecified injury of head, initial encounter: Secondary | ICD-10-CM | POA: Diagnosis not present

## 2023-01-14 DIAGNOSIS — I10 Essential (primary) hypertension: Secondary | ICD-10-CM | POA: Insufficient documentation

## 2023-01-14 DIAGNOSIS — E119 Type 2 diabetes mellitus without complications: Secondary | ICD-10-CM | POA: Diagnosis not present

## 2023-01-14 DIAGNOSIS — L89301 Pressure ulcer of unspecified buttock, stage 1: Secondary | ICD-10-CM | POA: Diagnosis not present

## 2023-01-14 DIAGNOSIS — I672 Cerebral atherosclerosis: Secondary | ICD-10-CM | POA: Diagnosis not present

## 2023-01-14 DIAGNOSIS — Z794 Long term (current) use of insulin: Secondary | ICD-10-CM | POA: Insufficient documentation

## 2023-01-14 DIAGNOSIS — W19XXXA Unspecified fall, initial encounter: Secondary | ICD-10-CM | POA: Diagnosis not present

## 2023-01-14 DIAGNOSIS — W07XXXA Fall from chair, initial encounter: Secondary | ICD-10-CM | POA: Insufficient documentation

## 2023-01-14 DIAGNOSIS — I6523 Occlusion and stenosis of bilateral carotid arteries: Secondary | ICD-10-CM | POA: Insufficient documentation

## 2023-01-14 DIAGNOSIS — R0789 Other chest pain: Secondary | ICD-10-CM | POA: Diagnosis not present

## 2023-01-14 DIAGNOSIS — M542 Cervicalgia: Secondary | ICD-10-CM | POA: Diagnosis not present

## 2023-01-14 DIAGNOSIS — I4891 Unspecified atrial fibrillation: Secondary | ICD-10-CM | POA: Diagnosis not present

## 2023-01-14 DIAGNOSIS — Z79899 Other long term (current) drug therapy: Secondary | ICD-10-CM | POA: Insufficient documentation

## 2023-01-14 DIAGNOSIS — Z043 Encounter for examination and observation following other accident: Secondary | ICD-10-CM | POA: Diagnosis not present

## 2023-01-14 DIAGNOSIS — M8580 Other specified disorders of bone density and structure, unspecified site: Secondary | ICD-10-CM | POA: Diagnosis not present

## 2023-01-14 DIAGNOSIS — R531 Weakness: Secondary | ICD-10-CM | POA: Diagnosis not present

## 2023-01-14 DIAGNOSIS — S199XXA Unspecified injury of neck, initial encounter: Secondary | ICD-10-CM | POA: Diagnosis not present

## 2023-01-14 LAB — CBC WITH DIFFERENTIAL/PLATELET
Abs Immature Granulocytes: 0.06 10*3/uL (ref 0.00–0.07)
Basophils Absolute: 0 10*3/uL (ref 0.0–0.1)
Basophils Relative: 0 %
Eosinophils Absolute: 0 10*3/uL (ref 0.0–0.5)
Eosinophils Relative: 0 %
HCT: 36.5 % (ref 36.0–46.0)
Hemoglobin: 11.2 g/dL — ABNORMAL LOW (ref 12.0–15.0)
Immature Granulocytes: 1 %
Lymphocytes Relative: 14 %
Lymphs Abs: 1.7 10*3/uL (ref 0.7–4.0)
MCH: 27 pg (ref 26.0–34.0)
MCHC: 30.7 g/dL (ref 30.0–36.0)
MCV: 88 fL (ref 80.0–100.0)
Monocytes Absolute: 0.7 10*3/uL (ref 0.1–1.0)
Monocytes Relative: 6 %
Neutro Abs: 9.8 10*3/uL — ABNORMAL HIGH (ref 1.7–7.7)
Neutrophils Relative %: 79 %
Platelets: 296 10*3/uL (ref 150–400)
RBC: 4.15 MIL/uL (ref 3.87–5.11)
RDW: 14.6 % (ref 11.5–15.5)
WBC: 12.2 10*3/uL — ABNORMAL HIGH (ref 4.0–10.5)
nRBC: 0 % (ref 0.0–0.2)

## 2023-01-14 LAB — COMPREHENSIVE METABOLIC PANEL
ALT: 13 U/L (ref 0–44)
AST: 13 U/L — ABNORMAL LOW (ref 15–41)
Albumin: 3.4 g/dL — ABNORMAL LOW (ref 3.5–5.0)
Alkaline Phosphatase: 80 U/L (ref 38–126)
Anion gap: 9 (ref 5–15)
BUN: 18 mg/dL (ref 8–23)
CO2: 23 mmol/L (ref 22–32)
Calcium: 9.2 mg/dL (ref 8.9–10.3)
Chloride: 108 mmol/L (ref 98–111)
Creatinine, Ser: 1.49 mg/dL — ABNORMAL HIGH (ref 0.44–1.00)
GFR, Estimated: 35 mL/min — ABNORMAL LOW (ref >60)
Glucose, Bld: 111 mg/dL — ABNORMAL HIGH (ref 70–99)
Potassium: 4 mmol/L (ref 3.5–5.1)
Sodium: 140 mmol/L (ref 135–145)
Total Bilirubin: 0.2 mg/dL (ref <1.2)
Total Protein: 7.2 g/dL (ref 6.5–8.1)

## 2023-01-14 LAB — TROPONIN I (HIGH SENSITIVITY): Troponin I (High Sensitivity): 11 ng/L (ref <18)

## 2023-01-14 LAB — CBG MONITORING, ED: Glucose-Capillary: 107 mg/dL — ABNORMAL HIGH (ref 70–99)

## 2023-01-14 LAB — MAGNESIUM: Magnesium: 2.1 mg/dL (ref 1.7–2.4)

## 2023-01-14 NOTE — ED Provider Notes (Incomplete)
11:56 PM Assumed care from Dr. Jearld Fenton, please see their note for full history, physical and decision making until this point. In brief this is a 82 y.o. year old female who presented to the ED tonight with Genia Hotter out of lift chair. Unwitnesed, no memory to the event. Son said she is Acting abnormal. Has headache and chest pain. Needs ua and trop.   Workup ultimately unremarkable.  Discussed with the son for prolonged period of time and he feels unsafe taking her home secondary to his inability to take care of her, her inability to participate with PT and recommendations.  Feels like she is at high risk for fall or just further decline secondary to her weakness.  Discussed options of case management from home versus case management and stay here he prefers her to stay here secondary to safety issues.  Home meds ordered.  Case management consult placed PT consult placed.  Labs, studies and imaging reviewed by myself and considered in medical decision making if ordered. Imaging interpreted by radiology.  Labs Reviewed  CBC WITH DIFFERENTIAL/PLATELET - Abnormal; Notable for the following components:      Result Value   WBC 12.2 (*)    Hemoglobin 11.2 (*)    Neutro Abs 9.8 (*)    All other components within normal limits  COMPREHENSIVE METABOLIC PANEL - Abnormal; Notable for the following components:   Glucose, Bld 111 (*)    Creatinine, Ser 1.49 (*)    Albumin 3.4 (*)    AST 13 (*)    GFR, Estimated 35 (*)    All other components within normal limits  CBG MONITORING, ED - Abnormal; Notable for the following components:   Glucose-Capillary 107 (*)    All other components within normal limits  MAGNESIUM  URINALYSIS, ROUTINE W REFLEX MICROSCOPIC  TROPONIN I (HIGH SENSITIVITY)    CT Head Wo Contrast  Final Result    CT Cervical Spine Wo Contrast  Final Result    DG Chest Portable 1 View  Final Result    DG Pelvis 1-2 Views  Final Result      No follow-ups on file.     Marily Memos, MD 01/15/23 [id number]    Marily Memos, MD 01/15/23 [phone]

## 2023-01-14 NOTE — ED Triage Notes (Signed)
Patient bib ems for fall out of lift recliner. Denies LOC. On blood thinners per son. Patient complaining of pain under left breast and back pain. C collar in place on arrival.

## 2023-01-14 NOTE — ED Provider Notes (Signed)
[place] EMERGENCY DEPARTMENT AT [facility] Provider Note   CSN: [id number] Arrival date & time: 01/14/23  2030     History {Add pertinent medical, surgical, social history, OB history to HPI:1} Chief Complaint  Patient presents with  . Fall    [name] is a 82 y.o. female with PMH as listed below who is bib ems for fall out of lift recliner. Patient states she was eating in her lift chair and "the next thing I knew, I was on the floor." Patient is unsure if she hit her head or lost consciousness. Son at bedside provides additional history. He was in the other room, gave her her dinner, and then heard her hollering and she was on the ground. Son states that she has had a lot of pain lately in her right thigh/buttock due to a pressure ulcer. Son states she hasn't complained of any flu-like symptoms, f/c, chest pain, SOB, cough, abdominal pain, N/V, urinary sxs. Patient complaining of pain under left breast and back pain. C collar in place on arrival. Son states that he recently got this new lift chair for the patient and she has been having trouble getting around.    Past Medical History:  Diagnosis Date  . Anemia, iron deficiency 2012   Evaluated by Dr. Darrick Penna; H&H of 9.3/30.8 with MCV-79 in 10/2010; 3/3 positive Hemoccult cards in 07/2011  . Anxiety    was on Prozac for a couple of weeks  . Atrial fibrillation (HCC)    takes Coumadin daily  . Bruises easily    takes Coumadin  . Chronic anticoagulation 07/21/2010  . Chronic back pain    related to knee pain  . Degenerative joint disease    Knees  . Dementia (HCC)   . Depression   . Diabetes mellitus    takes Metformin daily  . Gastroesophageal reflux disease    takes Omeprazole daily  . Glaucoma   . Glaucoma    uses eye drops at night  . History of blood transfusion 1969  . History of gout    was on medication but taken off of several months ago  . Hx of colonic polyps   . Hx of migraines    last one  about a yr ago;takes Topamax daily  . Hyperlipidemia    takes Pravastatin daily  . Hypertension    takes Hyzaar daily and Propranolol   . Insomnia    takes Restoril prn  . Joint pain   . Joint swelling   . Memory loss    takes donepezil  . Migraine   . Migraine   . Nocturia   . Overweight(278.02)   . Pneumonia 1969   hx of  . Pulmonary embolism [facility]) May 2012   acute presentation, bilateral PE  . Skin spots-aging   . Urinary frequency        Home Medications Prior to Admission medications   Medication Sig Start Date End Date Taking? Authorizing Provider  acetaminophen (TYLENOL) 500 MG tablet Take one tablet two times daily for chronic pain Patient taking differently: Take 500 mg by mouth in the morning and at bedtime. for chronic pain 04/19/19   Kerri Perches, MD  albuterol (VENTOLIN HFA) 108 (90 Base) MCG/ACT inhaler Inhale 2 puffs into the lungs every 6 (six) hours as needed for wheezing or shortness of breath. 08/14/20   Kerri Perches, MD  amLODipine (NORVASC) 10 MG tablet TAKE ONE TABLET BY MOUTH ONCE DAILY 05/14/22  Kerri Perches, MD  atorvastatin (LIPITOR) 40 MG tablet TAKE 1 TABLET BY MOUTH ONCE DAILY. 05/14/22   Kerri Perches, MD  azelastine (OPTIVAR) 0.05 % ophthalmic solution Place 1 drop into the right eye 2 (two) times daily. 03/18/22   Kerri Perches, MD  B-D ULTRAFINE III SHORT PEN 31G X 8 MM MISC USE AS DIRECTED WITH NOVOLIN TWICE DAILY 12/02/22   Kerri Perches, MD  BD INSULIN SYRINGE U/F 31G X 5/16" 1 ML MISC USE AS DIRECTED 05/14/22   Kerri Perches, MD  bisacodyl 5 MG EC tablet Take 2 tablets (10 mg total) by mouth daily as needed for moderate constipation. 12/01/22   Kerri Perches, MD  busPIRone (BUSPAR) 7.5 MG tablet TAKE ONE TABLET BY MOUTH THREE TIMES DAILY 05/14/22   Kerri Perches, MD  cloNIDine (CATAPRES) 0.2 MG tablet TAKE ONE TABLET BY MOUTH THREE TIMES DAILY 11/22/22   Kerri Perches, MD   clotrimazole-betamethasone (LOTRISONE) cream Apply 1 Application topically 2 (two) times daily. 12/01/22   Kerri Perches, MD  COMBIGAN 0.2-0.5 % ophthalmic solution Place 1 drop into both eyes 2 (two) times daily. 03/23/17   [provider]  Continuous Glucose Receiver (DEXCOM G7 RECEIVER) DEVI Use to monitor blood sugar daily dx: E11.49 12/31/22   Kerri Perches, MD  Continuous Glucose Sensor (DEXCOM G7 SENSOR) MISC Use to monitor blood sugar daily dx: e11.49 12/31/22   Kerri Perches, MD  dabigatran (PRADAXA) 150 MG CAPS capsule TAKE ONE CAPSULE BY MOUTH EVERY TWELVE HOURS 11/22/22   Kerri Perches, MD  docusate sodium (COLACE) 100 MG capsule Take 1 capsule (100 mg total) by mouth daily as needed for mild constipation. 12/01/22   Kerri Perches, MD  donepezil (ARICEPT) 10 MG tablet TAKE ONE TABLET BY MOUTH AT BEDTIME 05/14/22   Kerri Perches, MD  ferrous sulfate 325 (65 FE) MG EC tablet Take 1 tablet (325 mg total) by mouth 2 (two) times daily. 04/23/22 04/23/23  Sherryll Burger, Pratik D, DO  glucose blood (ACCU-CHEK AVIVA PLUS) test strip CHECK BLOOD SUGAR AS INSTRUCTED 09/01/22   Kerri Perches, MD  glycerin adult 2 g suppository Place 1 suppository rectally as needed for constipation. 04/27/22   Del Nigel Berthold, FNP  hydrocortisone (ANUSOL-HC) 25 MG suppository Place 1 suppository (25 mg total) rectally 2 (two) times daily. 12/01/22   Kerri Perches, MD  hydrOXYzine (ATARAX) 25 MG tablet TAKE ONE TABLET BY MOUTH THREE TIMES DAILY 11/22/22   Kerri Perches, MD  imipramine (TOFRANIL) 50 MG tablet TAKE 2 TABLETS BY MOUTH AT BEDTIME 08/09/22   Kerri Perches, MD  insulin isophane & regular human KwikPen (NOVOLIN 70/30 KWIKPEN) (70-30) 100 UNIT/ML KwikPen INJECT 55 UNITS SUBCUTANEOUSLY TWICE DAILY WITH FOOD 01/10/23   Kerri Perches, MD  insulin NPH-regular Human (NOVOLIN 70/30) (70-30) 100 UNIT/ML injection INJECT 55 UNITS INTO THE SKIN TWICE  DAILY WITH MEALS 09/24/22   Kerri Perches, MD  Insulin Pen Needle (ULTICARE MINI PEN NEEDLES) 31G X 6 MM MISC USE TO INJECT NOVOLIN TWICE DAILY. 01/14/22   Kerri Perches, MD  Insulin Syringe-Needle U-100 32G X 5/16" 1 ML MISC 1 each by Does not apply route in the morning and at bedtime. Use to inject insulin twice daily dx e11.65 05/16/19   Freddy Finner, NP  meclizine (ANTIVERT) 25 MG tablet Take 1 tablet (25 mg total) by mouth 3 (three) times daily as needed for dizziness.  11/28/19   Kerri Perches, MD  montelukast (SINGULAIR) 10 MG tablet Take 1 tablet (10 mg total) by mouth at bedtime. 01/14/22   Kerri Perches, MD  nitrofurantoin, macrocrystal-monohydrate, (MACROBID) 100 MG capsule Take 1 capsule (100 mg total) by mouth 2 (two) times daily. 08/17/22   Anabel Halon, MD  nitrofurantoin, macrocrystal-monohydrate, (MACROBID) 100 MG capsule Take 1 capsule (100 mg total) by mouth 2 (two) times daily. 12/01/22   Kerri Perches, MD  nystatin (MYCOSTATIN/NYSTOP) powder APPLY TOPCIALLY TO RASH UNDER BREASTS ONCE DAILY AS NEEDED. 06/03/21   Kerri Perches, MD  olopatadine (PATANOL) 0.1 % ophthalmic solution Place 1 drop into both eyes 2 (two) times daily. 03/18/22   Kerri Perches, MD  omeprazole (PRILOSEC) 40 MG capsule Take 1 capsule (40 mg total) by mouth daily. 12/01/22   Kerri Perches, MD  oxyCODONE-acetaminophen (PERCOCET) 10-325 MG tablet TAKE ONE TABLET BY MOUTH TWICE DAILY AS NEEDED FOR PAIN 11/03/22   Kerri Perches, MD  oxyCODONE-acetaminophen [facility]) 10-325 MG tablet Take one tablet by mouth two times daily 12/03/22   Kerri Perches, MD  oxyCODONE-acetaminophen (PERCOCET) 10-325 MG tablet Take one tablet by mouth two times daily for chronic pain 01/02/23   Kerri Perches, MD  oxyCODONE-acetaminophen (PERCOCET) 10-325 MG tablet Take one tablet by mouth wo times daily for pain 02/01/23   Kerri Perches, MD  Psyllium 48.57 % POWD Take 17  g in 8  ounces water every day 12/01/22   Kerri Perches, MD  SUMAtriptan (IMITREX) 25 MG tablet TAKE 1 TABLET BY MOUTH AS NEEDED FOR MIGRAINE. MAY REPEAT IN 2 HOURS IF HEADACHE PERSISTS OR RECURS. 06/04/20   Kerri Perches, MD  topiramate (TOPAMAX) 100 MG tablet TAKE 1 TABLET BY MOUTH TWICE DAILY 02/16/22   Kerri Perches, MD  UNABLE TO FIND Standard wheelchair with elevating leg rests DX M25.561, M54.41 03/12/20   Kerri Perches, MD  UNABLE TO FIND Skilled Nursing to eval and treat as indicated. Will need routine lab work and vital sign assessments. 08/14/20   Kerri Perches, MD  UNABLE TO FIND Home Health to evaluate and treat as indicated. Dx: Z74.2 08/27/20   Kerri Perches, MD  UNABLE TO FIND Med Name: toxassure 8 dx: PAIN MANAGEMENT CONTRACT Z08.89 09/10/21   Kerri Perches, MD  Vitamin D, Ergocalciferol, (DRISDOL) 1.25 MG (50000 UNIT) CAPS capsule TAKE ONE CAPSULE BY MOUTH WEEKLY 09/27/22   Kerri Perches, MD      Allergies    Penicillins, Fentanyl, Sulfonamide derivatives, Sulfur, and Codeine    Review of Systems   Review of Systems A 10 point review of systems was performed and is negative unless otherwise reported in HPI.  Physical Exam Updated Vital Signs BP (!) 175/81   Pulse 86   Temp 98.6 F (37 C) (Oral)   Resp (!) 24   Ht 5\' 6"  (1.676 m)   Wt 109.3 kg   SpO2 99%   BMI 38.90 kg/m  Physical Exam General: Normal appearing female, lying in bed.  HEENT: PERRLA, Sclera anicteric, MMM, trachea midline.  Cardiology: RRR, no murmurs/rubs/gallops. BL radial and DP pulses equal bilaterally.  Resp: Normal respiratory rate and effort. CTAB, no wheezes, rhonchi, crackles.  Abd: Soft, non-tender, non-distended. No rebound tenderness or guarding.  GU: Deferred. MSK: No peripheral edema or signs of trauma. Extremities without deformity or TTP. No cyanosis or clubbing. Skin: warm, dry. No rashes or lesions. Back: No  CVA tenderness. No midline C,  T, or L spine TTP. Stage 1 pressure ulcers on BL buttocks.  Neuro: A&Ox4, CNs II-XII grossly intact. MAEs. Sensation grossly intact.  Psych: Normal mood and affect.   ED Results / Procedures / Treatments   Labs (all labs ordered are listed, but only abnormal results are displayed) Labs Reviewed  CBC WITH DIFFERENTIAL/PLATELET - Abnormal; Notable for the following components:      Result Value   WBC 12.2 (*)    Hemoglobin 11.2 (*)    Neutro Abs 9.8 (*)    All other components within normal limits  COMPREHENSIVE METABOLIC PANEL - Abnormal; Notable for the following components:   Glucose, Bld 111 (*)    Creatinine, Ser 1.49 (*)    Albumin 3.4 (*)    AST 13 (*)    GFR, Estimated 35 (*)    All other components within normal limits  MAGNESIUM  CBG MONITORING, ED  TROPONIN I (HIGH SENSITIVITY)  TROPONIN I (HIGH SENSITIVITY)    EKG EKG Interpretation Date/Time:  Friday January 14 2023 22:49:19 EST Ventricular Rate:  87 PR Interval:  195 QRS Duration:  100 QT Interval:  363 QTC Calculation: 437 R Axis:   19  Text Interpretation: Sinus rhythm Atrial premature complex Abnormal R-wave progression, early transition Probable left ventricular hypertrophy Confirmed by Vivi Barrack [phone]) on 01/14/2023 11:50:11 PM  Radiology DG Pelvis 1-2 Views  Result Date: 01/14/2023 CLINICAL DATA:  Fall EXAM: PELVIS - 1-2 VIEW COMPARISON:  None Available. FINDINGS: The bones are osteopenic. There is no acute fracture or dislocation identified. Joint spaces are maintained. Soft tissues are within normal limits. IMPRESSION: Osteopenia. No acute fracture or dislocation. Electronically Signed   By: Darliss Cheney M.D.   On: 01/14/2023 21:29   DG Chest Portable 1 View  Result Date: 01/14/2023 CLINICAL DATA:  Fall EXAM: PORTABLE CHEST 1 VIEW COMPARISON:  Chest x-ray 07/29/2022 FINDINGS: The heart size and mediastinal contours are within normal limits. Both lungs are clear. The visualized skeletal  structures are unremarkable. IMPRESSION: No active disease. Electronically Signed   By: Darliss Cheney M.D.   On: 01/14/2023 21:26    Procedures Procedures  {Document cardiac monitor, telemetry assessment procedure when appropriate:1}  Medications Ordered in ED Medications - No data to display  ED Course/ Medical Decision Making/ A&P                          Medical Decision Making Amount and/or Complexity of Data Reviewed Labs: ordered. Decision-making details documented in ED Course. Radiology: ordered. Decision-making details documented in ED Course.  Risk OTC drugs. Prescription drug management.    This patient presents to the ED for concern of ***, this involves an extensive number of treatment options, and is a complaint that carries with it a high risk of complications and morbidity.  I considered the following differential and admission for this acute, potentially life threatening condition.   MDM:    ***  Clinical Course as of 01/18/23 1703  Fri Jan 14, 2023  2349 CT Head Wo Contrast 1. No acute intracranial process. 2. No acute fracture or traumatic subluxation of the cervical spine. 3. Degenerative changes of the cervical spine.   [HN]  2349 DG Pelvis 1-2 Views Osteopenia. No acute fracture or dislocation. [HN]  2349 DG Chest Portable 1 View No active disease. [HN]  2350 Comprehensive metabolic panel(!) Unremarkable in the context of this patient's presentation  [HN]  Clinical Course User Index [HN] Loetta Rough, MD    Labs: I Ordered, and personally interpreted labs.  The pertinent results include:  ***  Imaging Studies ordered: I ordered imaging studies including *** I independently visualized and interpreted imaging. I agree with the radiologist interpretation  Additional history obtained from ***.  External records from outside source obtained and reviewed including ***  Cardiac Monitoring: .The patient was maintained on a cardiac  monitor.  I personally viewed and interpreted the cardiac monitored which showed an underlying rhythm of: ***  Reevaluation: After the interventions noted above, I reevaluated the patient and found that they have :{resolved/improved/worsened:23923::"improved"}  Social Determinants of Health: .***  Disposition:  ***  Co morbidities that complicate the patient evaluation . Past Medical History:  Diagnosis Date  . Anemia, iron deficiency 2012   Evaluated by Dr. Darrick Penna; H&H of 9.3/30.8 with MCV-79 in 10/2010; 3/3 positive Hemoccult cards in 07/2011  . Anxiety    was on Prozac for a couple of weeks  . Atrial fibrillation (HCC)    takes Coumadin daily  . Bruises easily    takes Coumadin  . Chronic anticoagulation 07/21/2010  . Chronic back pain    related to knee pain  . Degenerative joint disease    Knees  . Dementia (HCC)   . Depression   . Diabetes mellitus    takes Metformin daily  . Gastroesophageal reflux disease    takes Omeprazole daily  . Glaucoma   . Glaucoma    uses eye drops at night  . History of blood transfusion 1969  . History of gout    was on medication but taken off of several months ago  . Hx of colonic polyps   . Hx of migraines    last one about a yr ago;takes Topamax daily  . Hyperlipidemia    takes Pravastatin daily  . Hypertension    takes Hyzaar daily and Propranolol   . Insomnia    takes Restoril prn  . Joint pain   . Joint swelling   . Memory loss    takes donepezil  . Migraine   . Migraine   . Nocturia   . Overweight(278.02)   . Pneumonia 1969   hx of  . Pulmonary embolism [facility]) May 2012   acute presentation, bilateral PE  . Skin spots-aging   . Urinary frequency      Medicines No orders of the defined types were placed in this encounter.   I have reviewed the patients home medicines and have made adjustments as needed  Problem List / ED Course: Problem List Items Addressed This Visit   None        {Document critical  care time when appropriate:1} {Document review of labs and clinical decision tools ie heart score, Chads2Vasc2 etc:1}  {Document your independent review of radiology images, and any outside records:1} {Document your discussion with family members, caretakers, and with consultants:1} {Document social determinants of health affecting pt's care:1} {Document your decision making why or why not admission, treatments were needed:1}  This note was created using dictation software, which may contain spelling or grammatical errors.

## 2023-01-15 LAB — URINALYSIS, ROUTINE W REFLEX MICROSCOPIC
Bacteria, UA: NONE SEEN
Bilirubin Urine: NEGATIVE
Glucose, UA: NEGATIVE mg/dL
Hgb urine dipstick: NEGATIVE
Ketones, ur: NEGATIVE mg/dL
Nitrite: NEGATIVE
Protein, ur: 100 mg/dL — AB
Specific Gravity, Urine: 1.012 (ref 1.005–1.030)
WBC, UA: >50 WBC/hpf (ref 0–5)
pH: 7 (ref 5.0–8.0)

## 2023-01-15 LAB — CBG MONITORING, ED
Glucose-Capillary: 87 mg/dL (ref 70–99)
Glucose-Capillary: 220 mg/dL — ABNORMAL HIGH (ref 70–99)

## 2023-01-15 LAB — TROPONIN I (HIGH SENSITIVITY): Troponin I (High Sensitivity): 11 ng/L (ref <18)

## 2023-01-15 MED ORDER — HYDROXYZINE HCL 25 MG PO TABS
25.0000 mg | ORAL_TABLET | Freq: Three times a day (TID) | ORAL | Status: DC
  Administered 2023-01-15 – 2023-01-18 (×12): 25 mg via ORAL
  Filled 2023-01-15 (×12): qty 1

## 2023-01-15 MED ORDER — DOCUSATE SODIUM 100 MG PO CAPS
100.0000 mg | ORAL_CAPSULE | Freq: Every day | ORAL | Status: DC | PRN
  Administered 2023-01-16: 100 mg via ORAL
  Filled 2023-01-15: qty 1

## 2023-01-15 MED ORDER — OXYCODONE-ACETAMINOPHEN 10-325 MG PO TABS: 1.0000 | ORAL_TABLET | Freq: Four times a day (QID) | ORAL | Status: DC | PRN

## 2023-01-15 MED ORDER — ATORVASTATIN CALCIUM 40 MG PO TABS
40.0000 mg | ORAL_TABLET | Freq: Every day | ORAL | Status: DC
  Administered 2023-01-15 – 2023-01-18 (×4): 40 mg via ORAL
  Filled 2023-01-15 (×4): qty 1

## 2023-01-15 MED ORDER — TOPIRAMATE 25 MG PO TABS
100.0000 mg | ORAL_TABLET | Freq: Two times a day (BID) | ORAL | Status: DC
  Administered 2023-01-15 – 2023-01-18 (×8): 100 mg via ORAL
  Filled 2023-01-15 (×9): qty 4

## 2023-01-15 MED ORDER — OXYCODONE HCL 5 MG PO TABS
5.0000 mg | ORAL_TABLET | Freq: Four times a day (QID) | ORAL | Status: DC | PRN
  Administered 2023-01-15 – 2023-01-18 (×5): 5 mg via ORAL
  Filled 2023-01-15 (×5): qty 1

## 2023-01-15 MED ORDER — ACETAMINOPHEN 500 MG PO TABS
500.0000 mg | ORAL_TABLET | Freq: Three times a day (TID) | ORAL | Status: DC | PRN
  Administered 2023-01-16: 500 mg via ORAL
  Filled 2023-01-15 (×2): qty 1

## 2023-01-15 MED ORDER — IMIPRAMINE HCL 25 MG PO TABS
50.0000 mg | ORAL_TABLET | Freq: Every day | ORAL | Status: DC
  Administered 2023-01-15 – 2023-01-18 (×3): 50 mg via ORAL
  Filled 2023-01-15: qty 1
  Filled 2023-01-15: qty 2
  Filled 2023-01-15: qty 4
  Filled 2023-01-15: qty 2
  Filled 2023-01-15: qty 1
  Filled 2023-01-15 (×2): qty 2

## 2023-01-15 MED ORDER — FERROUS SULFATE 325 (65 FE) MG PO TABS
325.0000 mg | ORAL_TABLET | Freq: Two times a day (BID) | ORAL | Status: DC
  Administered 2023-01-15 – 2023-01-18 (×8): 325 mg via ORAL
  Filled 2023-01-15 (×9): qty 1

## 2023-01-15 MED ORDER — BUSPIRONE HCL 5 MG PO TABS
7.5000 mg | ORAL_TABLET | Freq: Three times a day (TID) | ORAL | Status: DC
  Administered 2023-01-15 – 2023-01-18 (×12): 7.5 mg via ORAL
  Filled 2023-01-15 (×12): qty 2

## 2023-01-15 MED ORDER — CLONIDINE HCL 0.2 MG PO TABS
0.2000 mg | ORAL_TABLET | Freq: Three times a day (TID) | ORAL | Status: DC
  Administered 2023-01-15 – 2023-01-18 (×12): 0.2 mg via ORAL
  Filled 2023-01-15 (×12): qty 1

## 2023-01-15 MED ORDER — DONEPEZIL HCL 5 MG PO TABS
10.0000 mg | ORAL_TABLET | Freq: Every day | ORAL | Status: DC
  Administered 2023-01-15 – 2023-01-18 (×4): 10 mg via ORAL
  Filled 2023-01-15 (×4): qty 2

## 2023-01-15 MED ORDER — SUMATRIPTAN SUCCINATE 25 MG PO TABS: 25.0000 mg | ORAL_TABLET | ORAL | Status: DC | PRN

## 2023-01-15 MED ORDER — BISACODYL 5 MG PO TBEC: 10.0000 mg | DELAYED_RELEASE_TABLET | Freq: Every day | ORAL | Status: DC | PRN

## 2023-01-15 MED ORDER — MONTELUKAST SODIUM 10 MG PO TABS
10.0000 mg | ORAL_TABLET | Freq: Every day | ORAL | Status: DC
  Administered 2023-01-15 – 2023-01-18 (×4): 10 mg via ORAL
  Filled 2023-01-15 (×4): qty 1

## 2023-01-15 MED ORDER — DABIGATRAN ETEXILATE MESYLATE 150 MG PO CAPS
150.0000 mg | ORAL_CAPSULE | Freq: Two times a day (BID) | ORAL | Status: DC
  Administered 2023-01-15 – 2023-01-18 (×8): 150 mg via ORAL
  Filled 2023-01-15 (×15): qty 1

## 2023-01-15 MED ORDER — ACETAMINOPHEN 325 MG PO TABS
650.0000 mg | ORAL_TABLET | Freq: Once | ORAL | Status: AC
  Administered 2023-01-15: 650 mg via ORAL
  Filled 2023-01-15: qty 2

## 2023-01-15 MED ORDER — PANTOPRAZOLE SODIUM 40 MG PO TBEC
80.0000 mg | DELAYED_RELEASE_TABLET | Freq: Every day | ORAL | Status: DC
  Administered 2023-01-15 – 2023-01-18 (×4): 80 mg via ORAL
  Filled 2023-01-15 (×4): qty 2

## 2023-01-15 MED ORDER — OXYCODONE-ACETAMINOPHEN 5-325 MG PO TABS
1.0000 | ORAL_TABLET | Freq: Four times a day (QID) | ORAL | Status: DC | PRN
  Administered 2023-01-15 – 2023-01-18 (×4): 1 via ORAL
  Filled 2023-01-15 (×4): qty 1

## 2023-01-15 MED ORDER — AMLODIPINE BESYLATE 5 MG PO TABS
10.0000 mg | ORAL_TABLET | Freq: Every day | ORAL | Status: DC
  Administered 2023-01-15 – 2023-01-18 (×4): 10 mg via ORAL
  Filled 2023-01-15 (×4): qty 2

## 2023-01-15 MED ORDER — INSULIN ASPART PROT & ASPART (70-30 MIX) 100 UNIT/ML ~~LOC~~ SUSP
55.0000 [IU] | Freq: Two times a day (BID) | SUBCUTANEOUS | Status: DC
  Administered 2023-01-15 – 2023-01-18 (×7): 55 [IU] via SUBCUTANEOUS
  Filled 2023-01-15 (×2): qty 10

## 2023-01-15 NOTE — NC FL2 (Signed)
[place] MEDICAID FL2 LEVEL OF CARE FORM     IDENTIFICATION  Patient Name: SILLER MALLER Birthdate: [birth year] Sex: female Admission Date (Current Location): 01/14/2023  [facility] and [place] Number:      Facility and Address:  [facility],  [address] [place] [address]      Provider Number: [id number]  Attending Physician Name and Address:  System, Provider Not In  Relative Name and Phone Number:  Ewell Poe, son, [phone]    Current Level of Care: Hospital (Emergency Department) Recommended Level of Care: Skilled Nursing Facility Prior Approval Number:    Date Approved/Denied:   PASRR Number: [id number]  Discharge Plan: SNF    Current Diagnoses: Patient Active Problem List   Diagnosis Date Noted   Annual visit for general adult medical examination with abnormal findings 12/01/2022   Immunization due 12/01/2022   Hemorrhoids 12/01/2022   Sacral ulcer, limited to breakdown of skin (HCC) 12/01/2022   Chronic hip pain, right 12/01/2022   Malodorous urine 08/04/2022   Candidiasis of esophagus (HCC) 05/27/2022   Constipation 04/27/2022   Symptomatic anemia 04/20/2022   Benign hypertensive heart and CKD, 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), w CHF (HCC) 03/21/2022   DNR (do not resuscitate) 08/12/2021   Need for home health care 08/27/2020   Pain management contract discussed 03/15/2020   CKD stage G3b/A1, 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) 04/20/2019   Esophageal dysphagia 01/26/2019   Dysuria 04/12/2018   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   History of pulmonary embolism 01/25/2018   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   Encounter for chronic pain management 07/17/2016   Chronic pain syndrome 02/03/2016   Nausea 09/03/2014   Bilateral knee pain 04/23/2014   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   Right-sided low back pain with right-sided sciatica 07/02/2013   Seasonal allergies 04/11/2013   Urinary  incontinence 04/11/2013   Metabolic syndrome X 12/22/2012   Chronic anticoagulation 12/22/2012   Vitamin D deficiency 11/05/2012   Memory loss 11/01/2012   Depression with anxiety 11/01/2012   Anemia, iron deficiency    Morbid obesity (HCC) 08/16/2008   Globus pharyngeus 01/26/2008   Hyperlipidemia LDL goal <100 05/30/2007   Migraine 05/30/2007   Essential hypertension 05/30/2007   Gastroesophageal reflux disease 05/30/2007    Orientation RESPIRATION BLADDER Height & Weight     Self, Place, Situation  O2 (Per pt son, pt only uses supplemental O2 @ nights or PRN when at home) Incontinent Weight: 241 lb (109.3 kg) Height:  5\' 6"  (167.6 cm)  BEHAVIORAL SYMPTOMS/MOOD NEUROLOGICAL BOWEL NUTRITION STATUS      Incontinent Diet (carb modified)  AMBULATORY STATUS COMMUNICATION OF NEEDS Skin   Extensive Assist Verbally  (Stage 2 sacrum)                       Personal Care Assistance Level of Assistance  Bathing, Feeding, Dressing Bathing Assistance: Maximum assistance Feeding assistance: Independent Dressing Assistance: Maximum assistance     Functional Limitations Info  Sight, Hearing, Speech Sight Info: Adequate Hearing Info: Adequate Speech Info: Adequate    SPECIAL CARE FACTORS FREQUENCY  PT (By licensed PT)     PT Frequency: 5x/week              Contractures Contractures Info: Not present    Additional Factors Info  Allergies   Allergies Info: Penicillins, Fentanyl, sulfonamide derivatives, sulfur, codeine           Current Medications (01/15/2023):  This is the current hospital active medication list Current Facility-Administered Medications  Medication Dose Route Frequency Provider Last Rate Last Admin   acetaminophen (TYLENOL) tablet 500 mg  500 mg Oral Q8H PRN Mesner, Barbara Cower, MD       amLODipine (NORVASC) tablet 10 mg  10 mg Oral Daily Mesner, Barbara Cower, MD   10 mg at 01/15/23 0935   atorvastatin (LIPITOR) tablet 40 mg  40 mg Oral Daily Mesner, Barbara Cower,  MD   40 mg at 01/15/23 0934   bisacodyl (DULCOLAX) EC tablet 10 mg  10 mg Oral Daily PRN Mesner, Barbara Cower, MD       busPIRone (BUSPAR) tablet 7.5 mg  7.5 mg Oral TID Mesner, Barbara Cower, MD   7.5 mg at 01/15/23 0935   cloNIDine (CATAPRES) tablet 0.2 mg  0.2 mg Oral TID Mesner, Barbara Cower, MD   0.2 mg at 01/15/23 0935   dabigatran (PRADAXA) capsule 150 mg  150 mg Oral Q12H Mesner, Barbara Cower, MD   150 mg at 01/15/23 2956   docusate sodium (COLACE) capsule 100 mg  100 mg Oral Daily PRN Mesner, Barbara Cower, MD       donepezil (ARICEPT) tablet 10 mg  10 mg Oral QHS Mesner, Barbara Cower, MD       ferrous sulfate tablet 325 mg  325 mg Oral BID Mesner, Barbara Cower, MD   325 mg at 01/15/23 2130   hydrOXYzine (ATARAX) tablet 25 mg  25 mg Oral TID Mesner, Barbara Cower, MD   25 mg at 01/15/23 0932   imipramine (TOFRANIL) tablet 50 mg  50 mg Oral QHS Mesner, Barbara Cower, MD       insulin aspart protamine- aspart (NOVOLOG MIX 70/30) injection 55 Units  55 Units Subcutaneous BID WC Mesner, Barbara Cower, MD       montelukast (SINGULAIR) tablet 10 mg  10 mg Oral QHS Mesner, Barbara Cower, MD       oxyCODONE-acetaminophen (PERCOCET/ROXICET) 5-325 MG per tablet 1 tablet  1 tablet Oral Q6H PRN Mesner, Barbara Cower, MD   1 tablet at 01/15/23 0930   And   oxyCODONE (Oxy IR/ROXICODONE) immediate release tablet 5 mg  5 mg Oral Q6H PRN Mesner, Barbara Cower, MD   5 mg at 01/15/23 0929   pantoprazole (PROTONIX) EC tablet 80 mg  80 mg Oral Daily Mesner, Barbara Cower, MD   80 mg at 01/15/23 0936   SUMAtriptan (IMITREX) tablet 25 mg  25 mg Oral Q2H PRN Mesner, Barbara Cower, MD       topiramate (TOPAMAX) tablet 100 mg  100 mg Oral BID Mesner, Barbara Cower, MD   100 mg at 01/15/23 8657   Current Outpatient Medications  Medication Sig Dispense Refill   acetaminophen (TYLENOL) 500 MG tablet Take one tablet two times daily for chronic pain (Patient taking differently: Take 500 mg by mouth in the morning and at bedtime. for chronic pain) 60 tablet 5   albuterol (VENTOLIN HFA) 108 (90 Base) MCG/ACT inhaler Inhale 2 puffs into  the lungs every 6 (six) hours as needed for wheezing or shortness of breath. 18 each 3   amLODipine (NORVASC) 10 MG tablet TAKE ONE TABLET BY MOUTH ONCE DAILY 90 tablet 11   atorvastatin (LIPITOR) 40 MG tablet TAKE 1 TABLET BY MOUTH ONCE DAILY. 90 tablet 11   azelastine (OPTIVAR) 0.05 % ophthalmic solution Place 1 drop into the right eye 2 (two) times daily. 6 mL 5   B-D ULTRAFINE III SHORT PEN 31G X 8 MM MISC USE AS DIRECTED WITH NOVOLIN TWICE DAILY 100 each 1   BD INSULIN  SYRINGE U/F 31G X 5/16" 1 ML MISC USE AS DIRECTED 100 each 0   bisacodyl 5 MG EC tablet Take 2 tablets (10 mg total) by mouth daily as needed for moderate constipation. 10 tablet 5   busPIRone (BUSPAR) 7.5 MG tablet TAKE ONE TABLET BY MOUTH THREE TIMES DAILY 90 tablet 11   cloNIDine (CATAPRES) 0.2 MG tablet TAKE ONE TABLET BY MOUTH THREE TIMES DAILY 180 tablet 1   clotrimazole-betamethasone (LOTRISONE) cream Apply 1 Application topically 2 (two) times daily. 45 g 2   COMBIGAN 0.2-0.5 % ophthalmic solution Place 1 drop into both eyes 2 (two) times daily.  6   Continuous Glucose Receiver (DEXCOM G7 RECEIVER) DEVI Use to monitor blood sugar daily dx: E11.49 1 each 3   Continuous Glucose Sensor (DEXCOM G7 SENSOR) MISC Use to monitor blood sugar daily dx: e11.49 1 each 3   dabigatran (PRADAXA) 150 MG CAPS capsule TAKE ONE CAPSULE BY MOUTH EVERY TWELVE HOURS 60 capsule 2   docusate sodium (COLACE) 100 MG capsule Take 1 capsule (100 mg total) by mouth daily as needed for mild constipation. 60 capsule 11   donepezil (ARICEPT) 10 MG tablet TAKE ONE TABLET BY MOUTH AT BEDTIME 90 tablet 11   ferrous sulfate 325 (65 FE) MG EC tablet Take 1 tablet (325 mg total) by mouth 2 (two) times daily. 60 tablet 3   glucose blood (ACCU-CHEK AVIVA PLUS) test strip CHECK BLOOD SUGAR AS INSTRUCTED 100 strip 5   glycerin adult 2 g suppository Place 1 suppository rectally as needed for constipation. 12 suppository 0   hydrocortisone (ANUSOL-HC) 25 MG  suppository Place 1 suppository (25 mg total) rectally 2 (two) times daily. 12 suppository 3   hydrOXYzine (ATARAX) 25 MG tablet TAKE ONE TABLET BY MOUTH THREE TIMES DAILY 270 tablet 1   imipramine (TOFRANIL) 50 MG tablet TAKE 2 TABLETS BY MOUTH AT BEDTIME 180 tablet 4   insulin isophane & regular human KwikPen (NOVOLIN 70/30 KWIKPEN) (70-30) 100 UNIT/ML KwikPen INJECT 55 UNITS SUBCUTANEOUSLY TWICE DAILY WITH FOOD 30 mL 5   insulin NPH-regular Human (NOVOLIN 70/30) (70-30) 100 UNIT/ML injection INJECT 55 UNITS INTO THE SKIN TWICE DAILY WITH MEALS 30 mL 5   Insulin Pen Needle (ULTICARE MINI PEN NEEDLES) 31G X 6 MM MISC USE TO INJECT NOVOLIN TWICE DAILY. 100 each 5   Insulin Syringe-Needle U-100 32G X 5/16" 1 ML MISC 1 each by Does not apply route in the morning and at bedtime. Use to inject insulin twice daily dx e11.65 100 each 11   meclizine (ANTIVERT) 25 MG tablet Take 1 tablet (25 mg total) by mouth 3 (three) times daily as needed for dizziness. 60 tablet 1   montelukast (SINGULAIR) 10 MG tablet Take 1 tablet (10 mg total) by mouth at bedtime. 90 tablet 5   nystatin (MYCOSTATIN/NYSTOP) powder APPLY TOPCIALLY TO RASH UNDER BREASTS ONCE DAILY AS NEEDED. 15 g 0   olopatadine (PATANOL) 0.1 % ophthalmic solution Place 1 drop into both eyes 2 (two) times daily. 5 mL 1   omeprazole (PRILOSEC) 40 MG capsule Take 1 capsule (40 mg total) by mouth daily. 60 capsule 5   oxyCODONE-acetaminophen (PERCOCET) 10-325 MG tablet TAKE ONE TABLET BY MOUTH TWICE DAILY AS NEEDED FOR PAIN 60 tablet 0   oxyCODONE-acetaminophen (PERCOCET) 10-325 MG tablet Take one tablet by mouth two times daily 60 tablet 0   oxyCODONE-acetaminophen (PERCOCET) 10-325 MG tablet Take one tablet by mouth two times daily for chronic pain 60 tablet 0   [  START ON 02/01/2023] oxyCODONE-acetaminophen (PERCOCET) 10-325 MG tablet Take one tablet by mouth wo times daily for pain 60 tablet 0   Psyllium (GNP NATURAL FIBER) 28 % POWD Take 15 g by mouth  daily as needed (constipation).     SUMAtriptan (IMITREX) 25 MG tablet TAKE 1 TABLET BY MOUTH AS NEEDED FOR MIGRAINE. MAY REPEAT IN 2 HOURS IF HEADACHE PERSISTS OR RECURS. 10 tablet 11   topiramate (TOPAMAX) 100 MG tablet TAKE 1 TABLET BY MOUTH TWICE DAILY 180 tablet 11   UNABLE TO FIND Standard wheelchair with elevating leg rests DX M25.561, M54.41 1 each 0   UNABLE TO FIND Skilled Nursing to eval and treat as indicated. Will need routine lab work and vital sign assessments. 1 each 0   UNABLE TO FIND Home Health to evaluate and treat as indicated. Dx: Z74.2 1 each 0   UNABLE TO FIND Med Name: toxassure 8 dx: PAIN MANAGEMENT CONTRACT Z08.89 1 each 0   Vitamin D, Ergocalciferol, (DRISDOL) 1.25 MG (50000 UNIT) CAPS capsule TAKE ONE CAPSULE BY MOUTH WEEKLY 12 capsule 1   Facility-Administered Medications Ordered in Other Encounters  Medication Dose Route Frequency Provider Last Rate Last Admin   fentaNYL (SUBLIMAZE) injection 25-50 mcg  25-50 mcg Intravenous Q5 min PRN Laurene Footman, MD         Discharge Medications: Please see discharge summary for a list of discharge medications.  Relevant Imaging Results:  Relevant Lab Results:   Additional Information SS# [id number]  Annice Needy, LCSW

## 2023-01-15 NOTE — ED Notes (Signed)
Pt son asking about social worker coming to speak with him about caring for mother after hospitalization. Will pass on any information I receive pertaining to such.

## 2023-01-15 NOTE — ED Notes (Signed)
Pt recent CBG - 220. Notified AC that no supply and he informed me that he is going to get it mixed and sent to ED.

## 2023-01-15 NOTE — ED Notes (Signed)
PT in room now with patient

## 2023-01-15 NOTE — ED Notes (Signed)
Per pt son, pt only uses supplemental O2 @ nights or PRN when at home, so her O2 is being titrated from the 2L previously to see if pt is able to tolerate it.

## 2023-01-15 NOTE — ED Notes (Signed)
Pt presents with a pressure ulcer to both the left and right sides of the sacrum, approximately between stage 1 and 2. Dressing was applied to area prior to coming in to the ED but was coming un-done, so it was observed and then covered back up. Also, began to relieve pressure by applying pillow to underside of right side in bed and will change at or about every 2 hours to relieve pressure.

## 2023-01-15 NOTE — TOC Initial Note (Addendum)
Transition of Care [facility]) - Initial/Assessment Note    Patient Details  Name: [name] MRN: [id number] Date of Birth: September 19, 1940  [facility] of [facility]) CM/SW Contact:    Annice Needy, LCSW Phone Number: 01/15/2023, 2:18 PM  Clinical Narrative:                 Patient from home with son. Has lift chair, BSC, and hoyer lift. They have never used the lift as patient does not feel comfortable nor does son feel comfortable operating it. At baseline, patient can stand/pivot, and ambulates in the home with PT. Patient is active with HHPT with [facility]. PT recommends SNF. SNF choices discussed with son. Referral sent to SNF of choice.   Authorization started: Ref#: [id number], clinicals faxed to [phone]  Expected Discharge Plan: Skilled Nursing Facility Barriers to Discharge: ED SNF auth, ED Bed availability   Patient Goals and CMS Choice Patient states their goals for this hospitalization and ongoing recovery are:: son wants rehab then home   Choice offered to / list presented to : Adult Children      Expected Discharge Plan and Services     Post Acute Care Choice: Durable Medical Equipment (lift chair, BSC, hoyer lift) Living arrangements for the past 2 months: Single Family Home                                      Prior Living Arrangements/Services Living arrangements for the past 2 months: Single Family Home Lives with:: Adult Children   Do you feel safe going back to the place where you live?: Yes      Need for Family Participation in Patient Care: Yes (Comment) Care giver support system in place?: Yes (comment) Current home services: DME, Home PT (lift chair, bsc, hoyer lift) Criminal Activity/Legal Involvement Pertinent to Current Situation/Hospitalization: No - Comment as needed  Activities of Daily Living      Permission Sought/Granted Permission sought to share information with : Family Supports    Share Information with NAME: son, Jonny Ruiz            Emotional Assessment           Psych Involvement: No (comment)  Admission diagnosis:  FALL Patient Active Problem List   Diagnosis Date Noted   Annual visit for general adult medical examination with abnormal findings 12/01/2022   Immunization due 12/01/2022   Hemorrhoids 12/01/2022   Sacral ulcer, limited to breakdown of skin (HCC) 12/01/2022   Chronic hip pain, right 12/01/2022   Malodorous urine 08/04/2022   Candidiasis of esophagus (HCC) 05/27/2022   Constipation 04/27/2022   Symptomatic anemia 04/20/2022   Benign hypertensive heart and CKD, 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), w CHF (HCC) 03/21/2022   DNR (do not resuscitate) 08/12/2021   Need for home health care 08/27/2020   Pain management contract discussed 03/15/2020   CKD stage G3b/A1, 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) 04/20/2019   Esophageal dysphagia 01/26/2019   Dysuria 04/12/2018   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   History of pulmonary embolism 01/25/2018   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   Encounter for chronic pain management 07/17/2016   Chronic pain syndrome 02/03/2016   Nausea 09/03/2014   Bilateral knee pain 04/23/2014   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   Right-sided low back pain with right-sided sciatica 07/02/2013   Seasonal allergies 04/11/2013   Urinary incontinence 04/11/2013  Metabolic syndrome X 12/22/2012   Chronic anticoagulation 12/22/2012   Vitamin D deficiency 11/05/2012   Memory loss 11/01/2012   Depression with anxiety 11/01/2012   Anemia, iron deficiency    Morbid obesity (HCC) 08/16/2008   Globus pharyngeus 01/26/2008   Hyperlipidemia LDL goal <100 05/30/2007   Migraine 05/30/2007   Essential hypertension 05/30/2007   Gastroesophageal reflux disease 05/30/2007   PCP:  Kerri Perches, MD Pharmacy:   [facility] - [place], [place] - [address] [place] [address] Phone:  [phone] Fax: [phone]  [facility] [address] [place] - [address] [place] [address] [place] [address] [place] [address] Phone: [phone] Fax: [phone]     Social Determinants of Health (SDOH) Social History: SDOH Screenings   Food Insecurity: No Food Insecurity (09/14/2022)  Housing: Low Risk  (09/14/2022)  Transportation Needs: No Transportation Needs (09/14/2022)  Recent Concern: Transportation Needs - Unmet Transportation Needs (09/14/2022)  Utilities: Not At Risk (09/14/2022)  Alcohol Screen: Low Risk  (09/14/2022)  Depression (PHQ2-9): Low Risk  (09/14/2022)  Recent Concern: Depression (PHQ2-9) - Medium Risk (07/28/2022)  Financial Resource Strain: Low Risk  (09/14/2022)  Physical Activity: Inactive (09/14/2022)  Social Connections: Socially Isolated (09/14/2022)  Stress: No Stress Concern Present (09/14/2022)  Tobacco Use: Low Risk  (01/14/2023)  Health Literacy: Adequate Health Literacy (09/14/2022)   SDOH Interventions:     Readmission Risk Interventions    04/21/2022    2:05 PM  Readmission Risk Prevention Plan  Transportation Screening Complete  PCP or Specialist Appt within 5-7 Days Not Complete  Home Care Screening Complete  Medication Review (RN CM) Complete

## 2023-01-15 NOTE — Evaluation (Addendum)
Physical Therapy Evaluation Patient Details Name: [name] MRN: [id number] DOB: October 19, 1940 Today's Date: 01/15/2023  History of Present Illness  Pt has been declining physcially at home.  Was walking with a walker now basically transferring.  Pt fell out of her lift chair and was brought to the ER  Clinical Impression  Pt is an 82 yo female who has decreased ROM, strength and ambulation ability.  Pt fell from her chair and was brought to the ER.  PT is in need of skilled care at this time to improve the above deficits and improve her safety in mobility.         If plan is discharge home, recommend the following: A lot of help with walking and/or transfers;A lot of help with bathing/dressing/bathroom;Assistance with cooking/housework;Direct supervision/assist for medications management;Assist for transportation;Direct supervision/assist for financial management;Help with stairs or ramp for entrance   Can travel by private vehicle   No    Equipment Recommendations None recommended by PT  Recommendations for Other Services  OT consult    Functional Status Assessment Patient has had a recent decline in their functional status and demonstrates the ability to make significant improvements in function in a reasonable and predictable amount of time.     Precautions / Restrictions Precautions Precautions: Fall Restrictions Weight Bearing Restrictions: No      Mobility  Bed Mobility Overal bed mobility: Needs Assistance Bed Mobility: Supine to Sit     Supine to sit: Mod assist          Transfers Overall transfer level:  (unable at this time due to pain/weakness)                    Balance Overall balance assessment: Needs assistance, History of Falls Sitting-balance support: Bilateral upper extremity supported                                         Pertinent Vitals/Pain Pain Assessment Pain Assessment: Faces Faces Pain Scale: Hurts even  more Pain Location: hips and knee with motion Pain Intervention(s): Limited activity within patient's tolerance    Home Living Family/patient expects to be discharged to:: Skilled nursing facility                        Prior Function Prior Level of Function : Needs assist             Mobility Comments: Was walking with [facility] PT with walker down hallway now just transfers       Extremity/Trunk Assessment   Upper Extremity Assessment Upper Extremity Assessment: Generalized weakness    Lower Extremity Assessment Lower Extremity Assessment: Generalized weakness       Communication   Communication Communication: No apparent difficulties  Cognition Arousal: Alert Behavior During Therapy: WFL for tasks assessed/performed Overall Cognitive Status: Within Functional Limits for tasks assessed                                             Exercises General Exercises - Lower Extremity Ankle Circles/Pumps: AAROM, Both, 10 reps Quad Sets: 10 reps Gluteal Sets: 10 reps Heel Slides: 5 reps, Both, AAROM Hip ABduction/ADduction: Both, 5 reps   Assessment/Plan    PT Assessment Patient needs continued PT  services  PT Problem List Decreased strength;Decreased activity tolerance;Decreased balance;Decreased mobility       PT Treatment Interventions Functional mobility training;Therapeutic exercise;Gait training;Patient/family education    PT Goals (Current goals can be found in the Care Plan section)  Acute Rehab PT Goals Patient Stated Goal: unsure PT Goal Formulation: With family Time For Goal Achievement: 02/04/23 Potential to Achieve Goals: Good    Frequency Min 3X/week        AM-PAC PT "6 Clicks" Mobility  Outcome Measure Help needed turning from your back to your side while in a flat bed without using bedrails?: A Lot Help needed moving from lying on your back to sitting on the side of a flat bed without using bedrails?: A Lot Help needed  moving to and from a bed to a chair (including a wheelchair)?: A Lot Help needed standing up from a chair using your arms (e.g., wheelchair or bedside chair)?: A Lot Help needed to walk in hospital room?: A Lot Help needed climbing 3-5 steps with a railing? : Total 6 Click Score: 11    End of Session         PT Visit Diagnosis: Unsteadiness on feet (R26.81);Muscle weakness (generalized) (M62.81);History of falling (Z91.81);Difficulty in walking, not elsewhere classified (R26.2)    Time: 6213-0865 PT Time Calculation (min) (ACUTE ONLY): 33 min   Charges:   PT Evaluation $PT Eval Moderate Complexity: 1 Mod PT Treatments $Therapeutic Exercise: 8-22 mins PT General Charges $$ ACUTE PT VISIT: 1 Visit    Virgina Organ, PT CLT [phone]  01/15/2023, 10:14 AM

## 2023-01-15 NOTE — ED Notes (Signed)
Changed out pillow to left side to relieve pressure from sacrum.

## 2023-01-16 LAB — CBG MONITORING, ED
Glucose-Capillary: 101 mg/dL — ABNORMAL HIGH (ref 70–99)
Glucose-Capillary: 208 mg/dL — ABNORMAL HIGH (ref 70–99)

## 2023-01-16 MED ORDER — OXYCODONE HCL 5 MG PO TABS
5.0000 mg | ORAL_TABLET | ORAL | Status: AC
  Administered 2023-01-16: 5 mg via ORAL
  Filled 2023-01-16: qty 1

## 2023-01-16 NOTE — ED Provider Notes (Signed)
Emergency Medicine Observation Re-evaluation Note  [name] is a 82 y.o. female, seen on rounds today.  Pt initially presented to the ED for complaints of Fall Currently, the patient is not having any acute complaints.  Physical Exam  BP (!) 158/72   Pulse 66   Temp 97.9 F (36.6 C) (Oral)   Resp 16   Ht 5\' 6"  (1.676 m)   Wt 109.3 kg   SpO2 99%   BMI 38.90 kg/m  Physical Exam General: Resting comfortably in stretcher Lungs: Normal work of breathing Psych: Calm  ED Course / MDM  EKG:EKG Interpretation Date/Time:  Friday January 14 2023 22:49:19 EST Ventricular Rate:  87 PR Interval:  195 QRS Duration:  100 QT Interval:  363 QTC Calculation: 437 R Axis:   19  Text Interpretation: Sinus rhythm Atrial premature complex Abnormal R-wave progression, early transition Probable left ventricular hypertrophy Confirmed by Vivi Barrack [phone]) on 01/14/2023 11:50:11 PM  I have reviewed the labs performed to date as well as medications administered while in observation.  Recent changes in the last 24 hours include seen by physical therapy who is recommending SNF.  Plan  Current plan is for placement.    Rondel Baton, MD 01/16/23 [phone]

## 2023-01-16 NOTE — Progress Notes (Signed)
Auth still pending

## 2023-01-17 ENCOUNTER — Other Ambulatory Visit: Payer: Self-pay | Admitting: Family Medicine

## 2023-01-17 NOTE — ED Provider Notes (Signed)
Emergency Medicine Observation Re-evaluation Note  [name] is a 82 y.o. female, seen on rounds today.  Pt initially presented to the ED for complaints of Fall Currently, the patient is resting comfortably.  Physical Exam  BP (!) 147/72 (BP Location: Right Wrist)   Pulse 84   Temp 97.9 F (36.6 C) (Oral)   Resp 17   Ht 5\' 6"  (1.676 m)   Wt 109.3 kg   SpO2 96%   BMI 38.90 kg/m  Physical Exam Vitals and nursing note reviewed.  Constitutional:      General: She is not in acute distress.    Appearance: She is well-developed.  HENT:     Head: Normocephalic and atraumatic.  Eyes:     Conjunctiva/sclera: Conjunctivae normal.  Pulmonary:     Effort: Pulmonary effort is normal. No respiratory distress.  Musculoskeletal:        General: No swelling.     Cervical back: Neck supple.  Skin:    General: Skin is warm and dry.     Capillary Refill: Capillary refill takes less than 2 seconds.  Neurological:     Mental Status: She is alert.  Psychiatric:        Mood and Affect: Mood normal.      ED Course / MDM  EKG:EKG Interpretation Date/Time:  Friday January 14 2023 22:49:19 EST Ventricular Rate:  87 PR Interval:  195 QRS Duration:  100 QT Interval:  363 QTC Calculation: 437 R Axis:   19  Text Interpretation: Sinus rhythm Atrial premature complex Abnormal R-wave progression, early transition Probable left ventricular hypertrophy Confirmed by Vivi Barrack [phone]) on 01/14/2023 11:50:11 PM  I have reviewed the labs performed to date as well as medications administered while in observation.  Recent changes in the last 24 hours include continued social work eval.  Plan  Current plan is for placement.    Glendora Score, MD 01/17/23 [phone]

## 2023-01-17 NOTE — ED Notes (Signed)
SNF insurance Berkley Harvey is pending at this time. CSW updated pts son Ewell Poe that facility of choice [facility] offered a bed for pt, they accept. CSW updated that insurance Berkley Harvey is pending at this time and we will update him once we know something. TOC to follow.

## 2023-01-17 NOTE — ED Notes (Signed)
Pt's insulin Novolog mix 70/30 was not in Pyxis, so called pharmacy and they said they'd bring down soon.

## 2023-01-17 NOTE — ED Notes (Addendum)
Removed bedpan from under her, wiped her, replaced her dressing on patient's pressure injury. She has two sores currently at stage 2 on each buttock.

## 2023-01-18 LAB — CBG MONITORING, ED: Glucose-Capillary: 160 mg/dL — ABNORMAL HIGH (ref 70–99)

## 2023-01-18 MED ORDER — OXYCODONE-ACETAMINOPHEN 10-325 MG PO TABS: 1.0000 | ORAL_TABLET | Freq: Four times a day (QID) | ORAL | 0 refills | Status: DC | PRN

## 2023-01-18 NOTE — ED Provider Notes (Signed)
Emergency Medicine Observation Re-evaluation Note  [name] is a 82 y.o. female, seen on rounds today.  Pt initially presented to the ED for complaints of Fall Currently, the patient is resting comfortably.  Physical Exam  BP (!) 143/63   Pulse 72   Temp 98.5 F (36.9 C)   Resp 18   Ht 5\' 6"  (1.676 m)   Wt 109.3 kg   SpO2 92%   BMI 38.90 kg/m  Physical Exam Vitals and nursing note reviewed.  Constitutional:      General: She is not in acute distress.    Appearance: She is well-developed.  HENT:     Head: Normocephalic and atraumatic.  Eyes:     Conjunctiva/sclera: Conjunctivae normal.  Pulmonary:     Effort: Pulmonary effort is normal. No respiratory distress.  Musculoskeletal:        General: No swelling.     Cervical back: Neck supple.  Skin:    General: Skin is warm and dry.     Capillary Refill: Capillary refill takes less than 2 seconds.  Neurological:     Mental Status: She is alert.  Psychiatric:        Mood and Affect: Mood normal.      ED Course / MDM  EKG:EKG Interpretation Date/Time:  Friday January 14 2023 22:49:19 EST Ventricular Rate:  87 PR Interval:  195 QRS Duration:  100 QT Interval:  363 QTC Calculation: 437 R Axis:   19  Text Interpretation: Sinus rhythm Atrial premature complex Abnormal R-wave progression, early transition Probable left ventricular hypertrophy Confirmed by Vivi Barrack [id number]) on 01/14/2023 11:50:11 PM  I have reviewed the labs performed to date as well as medications administered while in observation.  Recent changes in the last 24 hours include continued social work evaluation, [facility] bed [facility] patient of which they accept.  Currently pending insurance authorization  Plan  Current plan is for placement.    Glendora Score, MD 01/18/23 [phone]

## 2023-01-18 NOTE — NC FL2 (Addendum)
[place] MEDICAID FL2 LEVEL OF CARE FORM     IDENTIFICATION  Patient Name: [name] Birthdate: 04-08-1940 Sex: female Admission Date (Current Location): 01/14/2023  [facility] and [place] Number:      Facility and Address:  [facility],  [address] [place] [address]      Provider Number: [id number]  Attending Physician Name and Address:  System, Provider Not In  Relative Name and Phone Number:  Ewell Poe, son, [phone]    Current Level of Care: Hospital (Emergency Department) Recommended Level of Care: Skilled Nursing Facility Prior Approval Number:    Date Approved/Denied:   PASRR Number: [id number]  Discharge Plan: SNF    Current Diagnoses: Patient Active Problem List   Diagnosis Date Noted   Annual visit for general adult medical examination with abnormal findings 12/01/2022   Immunization due 12/01/2022   Hemorrhoids 12/01/2022   Sacral ulcer, limited to breakdown of skin (HCC) 12/01/2022   Chronic hip pain, right 12/01/2022   Malodorous urine 08/04/2022   Candidiasis of esophagus (HCC) 05/27/2022   Constipation 04/27/2022   Symptomatic anemia 04/20/2022   Benign hypertensive heart and CKD, 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), w CHF (HCC) 03/21/2022   DNR (do not resuscitate) 08/12/2021   Need for home health care 08/27/2020   Pain management contract discussed 03/15/2020   CKD stage G3b/A1, 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) 04/20/2019   Esophageal dysphagia 01/26/2019   Dysuria 04/12/2018   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   History of pulmonary embolism 01/25/2018   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   Encounter for chronic pain management 07/17/2016   Chronic pain syndrome 02/03/2016   Nausea 09/03/2014   Bilateral knee pain 04/23/2014   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   Right-sided low back pain with right-sided sciatica 07/02/2013   Seasonal allergies 04/11/2013   Urinary  incontinence 04/11/2013   Metabolic syndrome X 12/22/2012   Chronic anticoagulation 12/22/2012   Vitamin D deficiency 11/05/2012   Memory loss 11/01/2012   Depression with anxiety 11/01/2012   Anemia, iron deficiency    Morbid obesity (HCC) 08/16/2008   Globus pharyngeus 01/26/2008   Hyperlipidemia LDL goal <100 05/30/2007   Migraine 05/30/2007   Essential hypertension 05/30/2007   Gastroesophageal reflux disease 05/30/2007    Orientation RESPIRATION BLADDER Height & Weight     Self, Place, Situation  O2 (Per pt son, pt only uses supplemental O2 @ nights or PRN when at home) Incontinent Weight: 241 lb (109.3 kg) Height:  5\' 6"  (167.6 cm)  BEHAVIORAL SYMPTOMS/MOOD NEUROLOGICAL BOWEL NUTRITION STATUS      Incontinent Diet (carb modified)  AMBULATORY STATUS COMMUNICATION OF NEEDS Skin   Extensive Assist Verbally  (Stage 2 sacrum)                       Personal Care Assistance Level of Assistance  Bathing, Feeding, Dressing Bathing Assistance: Maximum assistance Feeding assistance: Independent Dressing Assistance: Maximum assistance     Functional Limitations Info  Sight, Hearing, Speech Sight Info: Adequate Hearing Info: Adequate Speech Info: Adequate    SPECIAL CARE FACTORS FREQUENCY  PT (By licensed PT)     PT Frequency: 5x/week              Contractures Contractures Info: Not present    Additional Factors Info  Allergies   Allergies Info: Penicillins, Fentanyl, sulfonamide derivatives, sulfur, codeine           Current Medications (01/18/2023):  This is the current hospital active medication list Current Facility-Administered Medications  Medication Dose Route Frequency Provider Last Rate Last Admin   acetaminophen (TYLENOL) tablet 500 mg  500 mg Oral Q8H PRN Mesner, Barbara Cower, MD   500 mg at 01/16/23 1617   amLODipine (NORVASC) tablet 10 mg  10 mg Oral Daily Mesner, Jason, MD   10 mg at 01/18/23 1010   atorvastatin (LIPITOR) tablet 40 mg  40 mg Oral  Daily Mesner, Jason, MD   40 mg at 01/18/23 1008   bisacodyl (DULCOLAX) EC tablet 10 mg  10 mg Oral Daily PRN Mesner, Barbara Cower, MD       busPIRone (BUSPAR) tablet 7.5 mg  7.5 mg Oral TID Mesner, Barbara Cower, MD   7.5 mg at 01/18/23 1008   cloNIDine (CATAPRES) tablet 0.2 mg  0.2 mg Oral TID Mesner, Barbara Cower, MD   0.2 mg at 01/18/23 1011   dabigatran (PRADAXA) capsule 150 mg  150 mg Oral Q12H Mesner, Jason, MD   150 mg at 01/18/23 1008   docusate sodium (COLACE) capsule 100 mg  100 mg Oral Daily PRN Mesner, Barbara Cower, MD   100 mg at 01/16/23 1030   donepezil (ARICEPT) tablet 10 mg  10 mg Oral QHS Mesner, Jason, MD   10 mg at 01/17/23 2318   ferrous sulfate tablet 325 mg  325 mg Oral BID Mesner, Barbara Cower, MD   325 mg at 01/18/23 1010   hydrOXYzine (ATARAX) tablet 25 mg  25 mg Oral TID Mesner, Barbara Cower, MD   25 mg at 01/18/23 1010   imipramine (TOFRANIL) tablet 50 mg  50 mg Oral QHS Mesner, Jason, MD   50 mg at 01/16/23 2201   insulin aspart protamine- aspart (NOVOLOG MIX 70/30) injection 55 Units  55 Units Subcutaneous BID WC Mesner, Jason, MD   55 Units at 01/18/23 0826   montelukast (SINGULAIR) tablet 10 mg  10 mg Oral QHS Mesner, Jason, MD   10 mg at 01/17/23 2310   oxyCODONE-acetaminophen (PERCOCET/ROXICET) 5-325 MG per tablet 1 tablet  1 tablet Oral Q6H PRN Mesner, Barbara Cower, MD   1 tablet at 01/18/23 1011   And   oxyCODONE (Oxy IR/ROXICODONE) immediate release tablet 5 mg  5 mg Oral Q6H PRN Mesner, Barbara Cower, MD   5 mg at 01/17/23 2318   pantoprazole (PROTONIX) EC tablet 80 mg  80 mg Oral Daily Mesner, Jason, MD   80 mg at 01/18/23 1010   SUMAtriptan (IMITREX) tablet 25 mg  25 mg Oral Q2H PRN Mesner, Barbara Cower, MD       topiramate (TOPAMAX) tablet 100 mg  100 mg Oral BID Mesner, Barbara Cower, MD   100 mg at 01/18/23 1008   Current Outpatient Medications  Medication Sig Dispense Refill   amLODipine (NORVASC) 10 MG tablet TAKE ONE TABLET BY MOUTH ONCE DAILY 90 tablet 11   atorvastatin (LIPITOR) 40 MG tablet TAKE 1 TABLET BY MOUTH  ONCE DAILY. 90 tablet 11   azelastine (OPTIVAR) 0.05 % ophthalmic solution Place 1 drop into the right eye 2 (two) times daily. 6 mL 5   busPIRone (BUSPAR) 7.5 MG tablet TAKE ONE TABLET BY MOUTH THREE TIMES DAILY 90 tablet 11   cloNIDine (CATAPRES) 0.2 MG tablet TAKE ONE TABLET BY MOUTH THREE TIMES DAILY 180 tablet 1   clotrimazole-betamethasone (LOTRISONE) cream Apply 1 Application topically 2 (two) times daily. 45 g 2   COMBIGAN 0.2-0.5 % ophthalmic solution Place 1 drop into both eyes 2 (two) times daily.  6   dabigatran (PRADAXA)  150 MG CAPS capsule TAKE ONE CAPSULE BY MOUTH EVERY TWELVE HOURS 60 capsule 2   docusate sodium (COLACE) 100 MG capsule Take 1 capsule (100 mg total) by mouth daily as needed for mild constipation. (Patient taking differently: Take 100 mg by mouth 2 (two) times daily as needed for mild constipation.) 60 capsule 11   donepezil (ARICEPT) 10 MG tablet TAKE ONE TABLET BY MOUTH AT BEDTIME 90 tablet 11   ferrous sulfate 325 (65 FE) MG EC tablet Take 1 tablet (325 mg total) by mouth 2 (two) times daily. 60 tablet 3   hydrOXYzine (ATARAX) 25 MG tablet TAKE ONE TABLET BY MOUTH THREE TIMES DAILY 270 tablet 1   imipramine (TOFRANIL) 50 MG tablet TAKE 2 TABLETS BY MOUTH AT BEDTIME 180 tablet 4   insulin NPH-regular Human (NOVOLIN 70/30) (70-30) 100 UNIT/ML injection INJECT 55 UNITS INTO THE SKIN TWICE DAILY WITH MEALS 30 mL 5   meclizine (ANTIVERT) 25 MG tablet Take 1 tablet (25 mg total) by mouth 3 (three) times daily as needed for dizziness. 60 tablet 1   olopatadine (PATANOL) 0.1 % ophthalmic solution Place 1 drop into both eyes 2 (two) times daily. 5 mL 1   omeprazole (PRILOSEC) 40 MG capsule Take 1 capsule (40 mg total) by mouth daily. (Patient taking differently: Take 40 mg by mouth in the morning and at bedtime.) 60 capsule 5   oxyCODONE-acetaminophen (PERCOCET) 10-325 MG tablet TAKE ONE TABLET BY MOUTH TWICE DAILY AS NEEDED FOR PAIN 60 tablet 0   topiramate (TOPAMAX) 100 MG  tablet TAKE 1 TABLET BY MOUTH TWICE DAILY 180 tablet 11   Vitamin D, Ergocalciferol, (DRISDOL) 1.25 MG (50000 UNIT) CAPS capsule TAKE ONE CAPSULE BY MOUTH WEEKLY 12 capsule 1   B-D ULTRAFINE III SHORT PEN 31G X 8 MM MISC USE AS DIRECTED WITH NOVOLIN TWICE DAILY 100 each 1   BD INSULIN SYRINGE U/F 31G X 5/16" 1 ML MISC USE AS DIRECTED 100 each 0   Continuous Glucose Receiver (DEXCOM G7 RECEIVER) DEVI Use to monitor blood sugar daily dx: E11.49 1 each 3   Continuous Glucose Sensor (DEXCOM G7 SENSOR) MISC Use to monitor blood sugar daily dx: e11.49 1 each 3   glucose blood (ACCU-CHEK AVIVA PLUS) test strip CHECK BLOOD SUGAR AS INSTRUCTED 100 strip 5   Insulin Pen Needle (ULTICARE MINI PEN NEEDLES) 31G X 6 MM MISC USE TO INJECT NOVOLIN TWICE DAILY. 100 each 5   Insulin Syringe-Needle U-100 32G X 5/16" 1 ML MISC 1 each by Does not apply route in the morning and at bedtime. Use to inject insulin twice daily dx e11.65 100 each 11   montelukast (SINGULAIR) 10 MG tablet TAKE ONE TABLET BY MOUTH AT BEDTIME 90 tablet 5   oxyCODONE-acetaminophen (PERCOCET) 10-325 MG tablet Take one tablet by mouth two times daily 60 tablet 0   oxyCODONE-acetaminophen (PERCOCET) 10-325 MG tablet Take one tablet by mouth two times daily for chronic pain 60 tablet 0   [START ON 02/01/2023] oxyCODONE-acetaminophen (PERCOCET) 10-325 MG tablet Take one tablet by mouth wo times daily for pain 60 tablet 0   UNABLE TO FIND Standard wheelchair with elevating leg rests DX M25.561, M54.41 1 each 0   UNABLE TO FIND Skilled Nursing to eval and treat as indicated. Will need routine lab work and vital sign assessments. 1 each 0   UNABLE TO FIND Home Health to evaluate and treat as indicated. Dx: Z74.2 1 each 0   UNABLE TO FIND Med Name: toxassure  8 dx: PAIN MANAGEMENT CONTRACT Z08.89 1 each 0   Facility-Administered Medications Ordered in Other Encounters  Medication Dose Route Frequency Provider Last Rate Last Admin   fentaNYL (SUBLIMAZE)  injection 25-50 mcg  25-50 mcg Intravenous Q5 min PRN Laurene Footman, MD         Discharge Medications: Please see after visit summary for a list of discharge medications.  Relevant Imaging Results:  Relevant Lab Results:   Additional Information SS# [id number]  [place], [place]

## 2023-01-18 NOTE — ED Notes (Signed)
CSW updated that pts insurance Berkley Harvey has been approved for SNF at [facility]. CSW updated Jill Side in admissions who states they can accept pt today. MD placed pt up for D/C. CSW provided RN with room and report numbers. CSW left secure VM for pts son to provide update.CSW updated RN that [employer] operational officer can call for EMS when pt is ready. TOC signing off.

## 2023-01-18 NOTE — Progress Notes (Signed)
Physical Therapy Treatment Patient Details Name: [name] MRN: [id number] DOB: Dec 30, 1940 Today's Date: 01/18/2023   History of Present Illness Pt has been declining physcially at home.  Was walking with a walker now basically transferring.  Pt fell out of her lift chair and was brought to the ER    PT Comments  Pt patient was limited in today's treatment session, due to continued muscle weakness and excessive BLE pain with mobility.  As therapist initiated mobility treatment and progression, patient's right IV came out, therapist applied pressure while waiting for nurse and nurse tech to arrive with appropriate equipment to minimize bleeding.  Patient was wrapped up and assisted with further mobility with nurse tech and nurse present.  Upon further mobility patient noted with incontinence of bowel and bladder with max assist from nurse to perform PERI hygiene while nurse tech and physical therapist assisting with bed mobility and transfers.  Today's session addressed progression of mobility out of bed, with hopefulness to achieve ambulation.  Patient found to be continued with excessive muscle weakness in BLE, noted by need for +2 assistance at edge of bed to perform sit to stand and return to bed mobility due to pain and weakness.  Patient unable to progress toward ambulation due to safety concerns with further mobility as patient was needing +2 for maintaining standing with rolling walker.  Patient +2 for sit to stand from edge of bed with rolling walker for standing approximately 2-3 minutes before needing to sit down. Pt would continue to benefit from skilled acute physical therapy services in order to progress toward POC goals, safety/independence with functional mobility and QOL.     If plan is discharge home, recommend the following: Two people to help with walking and/or transfers;Two people to help with bathing/dressing/bathroom   Can travel by private vehicle     No  Equipment  Recommendations  None recommended by PT    Recommendations for Other Services OT consult     Precautions / Restrictions Precautions Precautions: Fall Restrictions Weight Bearing Restrictions: No     Mobility  Bed Mobility Overal bed mobility: Needs Assistance Bed Mobility: Supine to Sit, Sit to Supine, Rolling Rolling: Mod assist, +2 for physical assistance   Supine to sit: Max assist, +2 for physical assistance Sit to supine: Max assist, +2 for physical assistance   General bed mobility comments: History of performing bed mobility at max assist x 1 with BLE to extremity movement toward EOB and trunk elevation.  Upon elevation of trunk patient's right upper extremity IV came out and patient's right antecubital fossa started bleeding.  Physical therapist applied pressure while reaching for nurse call bell to retrieve patient's assigned nurse.  Waiting approximately 5 minutes with pressure on IV site while nurse and nurse tech came in with materials to stop bleeding.  From their nurse and nurse tech assisted patient due to incontinence of bladder and bowel upon mobility.  After standing at edge of bed with assistance from nurse tech, patient will performed bed mobility for PERI hygiene.  Max assist +2 for returning to supine from sitting edge of bed and rolling bilaterally due to pain during PERI hygiene.    Transfers Overall transfer level: Needs assistance Equipment used: Rolling walker (2 wheels) Transfers: Sit to/from Stand Sit to Stand: +2 physical assistance, Max assist           General transfer comment: Max assist x 2 with nurse tech at bilateral sides underneath axilla was for power to  stand with RW.  While nurse perform PERI hygiene in standing and bedsheet change.  Patient with max assist for maintaining standing and controlled stand to sit at edge of bed with cues for upper extremity and rolling walker management.    Ambulation/Gait               General Gait  Details: Gait not attempted due to excessive BLE muscle weakness and increased BLE pain.   Stairs             Wheelchair Mobility     Tilt Bed    Modified Rankin (Stroke Patients Only)       Balance Overall balance assessment: Needs assistance, History of Falls Sitting-balance support: Bilateral upper extremity supported, Feet supported Sitting balance-Leahy Scale: Poor Sitting balance - Comments: Eating consistent reminders to maintain upright support and sitting Postural control: Posterior lean Standing balance support: During functional activity, Reliant on assistive device for balance Standing balance-Leahy Scale: Poor Standing balance comment: Poor/poor standing balance with reliance on RW.                            Cognition Arousal: Alert Behavior During Therapy: WFL for tasks assessed/performed Overall Cognitive Status: Within Functional Limits for tasks assessed                                          Exercises General Exercises - Lower Extremity Long Arc Quad: AAROM, 5 reps, Both    General Comments        Pertinent Vitals/Pain Pain Assessment Pain Assessment: Faces Faces Pain Scale: Hurts whole lot Pain Location: hips and knee with motion Pain Descriptors / Indicators: Sharp Pain Intervention(s): Limited activity within patient's tolerance, Monitored during session    Home Living                          Prior Function            PT Goals (current goals can now be found in the care plan section) Acute Rehab PT Goals Patient Stated Goal: To get rehab PT Goal Formulation: With family Time For Goal Achievement: 02/04/23 Potential to Achieve Goals: Good Progress towards PT goals: Progressing toward goals    Frequency    Min 3X/week      PT Plan      Co-evaluation              AM-PAC PT "6 Clicks" Mobility   Outcome Measure  Help needed turning from your back to your side while in  a flat bed without using bedrails?: A Lot Help needed moving from lying on your back to sitting on the side of a flat bed without using bedrails?: A Lot Help needed moving to and from a bed to a chair (including a wheelchair)?: A Lot Help needed standing up from a chair using your arms (e.g., wheelchair or bedside chair)?: A Lot Help needed to walk in hospital room?: A Lot Help needed climbing 3-5 steps with a railing? : Total 6 Click Score: 11    End of Session Equipment Utilized During Treatment: Gait belt Activity Tolerance: Patient limited by pain Patient left: in bed;with call bell/phone within reach;with nursing/sitter in room Nurse Communication: Mobility status;Precautions PT Visit Diagnosis: Unsteadiness on feet (R26.81);Muscle weakness (generalized) (M62.81);History of falling (Z91.81);Difficulty  in walking, not elsewhere classified (R26.2)     Time: 1610-9604 PT Time Calculation (min) (ACUTE ONLY): 33 min  Charges:    $Therapeutic Activity: 23-37 mins PT General Charges $$ ACUTE PT VISIT: 1 Visit                     Nelida Meuse PT, DPT Physical Therapist with [facility] [phone] office   Nelida Meuse 01/18/2023, 10:04 AM

## 2023-01-18 NOTE — ED Notes (Signed)
pt will go to room 510 B and can call report to [phone].

## 2023-01-18 NOTE — ED Notes (Signed)
Ewell Poe (Son) requesting notification upon patient's transfer to [facility].

## 2023-01-18 NOTE — ED Notes (Signed)
INS Berkley Harvey is pending at this time. INS requesting updated PT note. CSW reached out to PT covering [facility] today and requested that they see pt for updated eval. TOC to follow.

## 2023-01-19 DIAGNOSIS — R531 Weakness: Secondary | ICD-10-CM | POA: Diagnosis not present

## 2023-01-19 DIAGNOSIS — Z96651 Presence of right artificial knee joint: Secondary | ICD-10-CM | POA: Diagnosis not present

## 2023-01-19 DIAGNOSIS — Z794 Long term (current) use of insulin: Secondary | ICD-10-CM | POA: Diagnosis not present

## 2023-01-19 DIAGNOSIS — Z7984 Long term (current) use of oral hypoglycemic drugs: Secondary | ICD-10-CM | POA: Diagnosis not present

## 2023-01-19 DIAGNOSIS — F411 Generalized anxiety disorder: Secondary | ICD-10-CM | POA: Diagnosis not present

## 2023-01-19 DIAGNOSIS — R1031 Right lower quadrant pain: Secondary | ICD-10-CM | POA: Diagnosis not present

## 2023-01-19 DIAGNOSIS — D649 Anemia, unspecified: Secondary | ICD-10-CM | POA: Diagnosis not present

## 2023-01-19 DIAGNOSIS — R319 Hematuria, unspecified: Secondary | ICD-10-CM | POA: Diagnosis not present

## 2023-01-19 DIAGNOSIS — R410 Disorientation, unspecified: Secondary | ICD-10-CM | POA: Diagnosis not present

## 2023-01-19 DIAGNOSIS — F331 Major depressive disorder, recurrent, moderate: Secondary | ICD-10-CM | POA: Diagnosis not present

## 2023-01-19 DIAGNOSIS — K5901 Slow transit constipation: Secondary | ICD-10-CM | POA: Diagnosis not present

## 2023-01-19 DIAGNOSIS — M5440 Lumbago with sciatica, unspecified side: Secondary | ICD-10-CM | POA: Diagnosis not present

## 2023-01-19 DIAGNOSIS — R1084 Generalized abdominal pain: Secondary | ICD-10-CM | POA: Diagnosis not present

## 2023-01-19 DIAGNOSIS — N390 Urinary tract infection, site not specified: Secondary | ICD-10-CM | POA: Diagnosis not present

## 2023-01-19 DIAGNOSIS — M5441 Lumbago with sciatica, right side: Secondary | ICD-10-CM | POA: Diagnosis not present

## 2023-01-19 DIAGNOSIS — Z79899 Other long term (current) drug therapy: Secondary | ICD-10-CM | POA: Diagnosis not present

## 2023-01-19 DIAGNOSIS — Z7401 Bed confinement status: Secondary | ICD-10-CM | POA: Diagnosis not present

## 2023-01-19 DIAGNOSIS — E119 Type 2 diabetes mellitus without complications: Secondary | ICD-10-CM | POA: Diagnosis not present

## 2023-01-19 DIAGNOSIS — G8929 Other chronic pain: Secondary | ICD-10-CM | POA: Diagnosis not present

## 2023-01-19 DIAGNOSIS — I130 Hypertensive heart and chronic kidney disease with heart failure and stage 1 through stage 4 chronic kidney disease, or unspecified chronic kidney disease: Secondary | ICD-10-CM | POA: Diagnosis not present

## 2023-01-19 DIAGNOSIS — K2970 Gastritis, unspecified, without bleeding: Secondary | ICD-10-CM | POA: Diagnosis not present

## 2023-01-19 DIAGNOSIS — I10 Essential (primary) hypertension: Secondary | ICD-10-CM | POA: Diagnosis not present

## 2023-01-19 DIAGNOSIS — N1832 Chronic kidney disease, stage 3b: Secondary | ICD-10-CM | POA: Diagnosis not present

## 2023-01-19 DIAGNOSIS — Z7901 Long term (current) use of anticoagulants: Secondary | ICD-10-CM | POA: Diagnosis not present

## 2023-01-19 DIAGNOSIS — F0390 Unspecified dementia without behavioral disturbance: Secondary | ICD-10-CM | POA: Diagnosis not present

## 2023-01-19 DIAGNOSIS — H409 Unspecified glaucoma: Secondary | ICD-10-CM | POA: Diagnosis not present

## 2023-01-19 DIAGNOSIS — R5381 Other malaise: Secondary | ICD-10-CM | POA: Diagnosis not present

## 2023-01-19 DIAGNOSIS — K5900 Constipation, unspecified: Secondary | ICD-10-CM | POA: Diagnosis not present

## 2023-01-19 DIAGNOSIS — J45998 Other asthma: Secondary | ICD-10-CM | POA: Diagnosis not present

## 2023-01-19 DIAGNOSIS — W1830XD Fall on same level, unspecified, subsequent encounter: Secondary | ICD-10-CM | POA: Diagnosis not present

## 2023-01-19 DIAGNOSIS — L89312 Pressure ulcer of right buttock, stage 2: Secondary | ICD-10-CM | POA: Diagnosis not present

## 2023-01-19 DIAGNOSIS — E7849 Other hyperlipidemia: Secondary | ICD-10-CM | POA: Diagnosis not present

## 2023-01-19 DIAGNOSIS — N3001 Acute cystitis with hematuria: Secondary | ICD-10-CM | POA: Diagnosis not present

## 2023-01-19 DIAGNOSIS — M6281 Muscle weakness (generalized): Secondary | ICD-10-CM | POA: Diagnosis not present

## 2023-01-19 DIAGNOSIS — I509 Heart failure, unspecified: Secondary | ICD-10-CM | POA: Diagnosis not present

## 2023-01-19 DIAGNOSIS — R109 Unspecified abdominal pain: Secondary | ICD-10-CM | POA: Diagnosis not present

## 2023-01-19 DIAGNOSIS — M25561 Pain in right knee: Secondary | ICD-10-CM | POA: Diagnosis not present

## 2023-01-19 DIAGNOSIS — E1122 Type 2 diabetes mellitus with diabetic chronic kidney disease: Secondary | ICD-10-CM | POA: Diagnosis not present

## 2023-01-19 DIAGNOSIS — I4891 Unspecified atrial fibrillation: Secondary | ICD-10-CM | POA: Diagnosis not present

## 2023-01-19 NOTE — ED Notes (Signed)
Pt has been added to the transportation list to [facility].

## 2023-01-24 DIAGNOSIS — R5381 Other malaise: Secondary | ICD-10-CM | POA: Diagnosis not present

## 2023-01-24 DIAGNOSIS — M5440 Lumbago with sciatica, unspecified side: Secondary | ICD-10-CM | POA: Diagnosis not present

## 2023-01-25 DIAGNOSIS — H409 Unspecified glaucoma: Secondary | ICD-10-CM | POA: Diagnosis not present

## 2023-01-25 DIAGNOSIS — E119 Type 2 diabetes mellitus without complications: Secondary | ICD-10-CM | POA: Diagnosis not present

## 2023-01-25 DIAGNOSIS — E7849 Other hyperlipidemia: Secondary | ICD-10-CM | POA: Diagnosis not present

## 2023-01-25 DIAGNOSIS — F0390 Unspecified dementia without behavioral disturbance: Secondary | ICD-10-CM | POA: Diagnosis not present

## 2023-01-26 ENCOUNTER — Ambulatory Visit: Payer: Self-pay | Admitting: Family Medicine

## 2023-01-26 DIAGNOSIS — L89312 Pressure ulcer of right buttock, stage 2: Secondary | ICD-10-CM | POA: Diagnosis not present

## 2023-01-26 DIAGNOSIS — D649 Anemia, unspecified: Secondary | ICD-10-CM | POA: Diagnosis not present

## 2023-01-26 DIAGNOSIS — E119 Type 2 diabetes mellitus without complications: Secondary | ICD-10-CM | POA: Diagnosis not present

## 2023-01-26 DIAGNOSIS — F0390 Unspecified dementia without behavioral disturbance: Secondary | ICD-10-CM | POA: Diagnosis not present

## 2023-01-26 NOTE — Telephone Encounter (Signed)
  Chief Complaint: Medical advice  Additional Notes: Patient's son wanting to know if patient needs to have Pneumonia vaccine. Pt is in nursing home and they are offering to update all her vaccines. Booster was in 2016   Reason for Disposition  Caller requesting routine or non-urgent lab result  Answer Assessment - Initial Assessment Questions 1. REASON FOR CALL or QUESTION: "What is your reason for calling today?" or "How can I best help you?" or "What question do you have that I can help answer?"     Patient son wanting to know if patient should get the pneumonia vaccine. Sts that patient is in nursing home and they are wanting to update all her vaccines.   2. CALLER: Document the source of call. (e.g., laboratory, patient).     Patient son, Ewell Poe  Protocols used: PCP Call - No Triage-A-AH

## 2023-01-27 ENCOUNTER — Emergency Department (HOSPITAL_COMMUNITY)
Admission: EM | Admit: 2023-01-27 | Discharge: 2023-01-28 | Disposition: A | Discharge status: Home / Self Care | Payer: Medicare HMO | Attending: Emergency Medicine | Admitting: Emergency Medicine

## 2023-01-27 ENCOUNTER — Emergency Department (HOSPITAL_COMMUNITY): Payer: Medicare HMO

## 2023-01-27 ENCOUNTER — Encounter (HOSPITAL_COMMUNITY): Payer: Self-pay

## 2023-01-27 ENCOUNTER — Other Ambulatory Visit: Payer: Self-pay

## 2023-01-27 DIAGNOSIS — E1122 Type 2 diabetes mellitus with diabetic chronic kidney disease: Secondary | ICD-10-CM | POA: Diagnosis not present

## 2023-01-27 DIAGNOSIS — K2970 Gastritis, unspecified, without bleeding: Secondary | ICD-10-CM | POA: Diagnosis not present

## 2023-01-27 DIAGNOSIS — K5900 Constipation, unspecified: Secondary | ICD-10-CM | POA: Diagnosis not present

## 2023-01-27 DIAGNOSIS — F0390 Unspecified dementia without behavioral disturbance: Secondary | ICD-10-CM | POA: Diagnosis not present

## 2023-01-27 DIAGNOSIS — I509 Heart failure, unspecified: Secondary | ICD-10-CM | POA: Diagnosis not present

## 2023-01-27 DIAGNOSIS — Z96651 Presence of right artificial knee joint: Secondary | ICD-10-CM | POA: Insufficient documentation

## 2023-01-27 DIAGNOSIS — Z7901 Long term (current) use of anticoagulants: Secondary | ICD-10-CM | POA: Diagnosis not present

## 2023-01-27 DIAGNOSIS — N1832 Chronic kidney disease, stage 3b: Secondary | ICD-10-CM | POA: Diagnosis not present

## 2023-01-27 DIAGNOSIS — Z794 Long term (current) use of insulin: Secondary | ICD-10-CM | POA: Diagnosis not present

## 2023-01-27 DIAGNOSIS — R109 Unspecified abdominal pain: Secondary | ICD-10-CM | POA: Diagnosis not present

## 2023-01-27 DIAGNOSIS — Z7984 Long term (current) use of oral hypoglycemic drugs: Secondary | ICD-10-CM | POA: Insufficient documentation

## 2023-01-27 DIAGNOSIS — I130 Hypertensive heart and chronic kidney disease with heart failure and stage 1 through stage 4 chronic kidney disease, or unspecified chronic kidney disease: Secondary | ICD-10-CM | POA: Diagnosis not present

## 2023-01-27 DIAGNOSIS — Z79899 Other long term (current) drug therapy: Secondary | ICD-10-CM | POA: Diagnosis not present

## 2023-01-27 DIAGNOSIS — R410 Disorientation, unspecified: Secondary | ICD-10-CM | POA: Diagnosis not present

## 2023-01-27 DIAGNOSIS — E119 Type 2 diabetes mellitus without complications: Secondary | ICD-10-CM | POA: Diagnosis not present

## 2023-01-27 DIAGNOSIS — R1031 Right lower quadrant pain: Secondary | ICD-10-CM | POA: Diagnosis not present

## 2023-01-27 DIAGNOSIS — E7849 Other hyperlipidemia: Secondary | ICD-10-CM | POA: Diagnosis not present

## 2023-01-27 DIAGNOSIS — N3001 Acute cystitis with hematuria: Secondary | ICD-10-CM

## 2023-01-27 DIAGNOSIS — H409 Unspecified glaucoma: Secondary | ICD-10-CM | POA: Diagnosis not present

## 2023-01-27 DIAGNOSIS — I4891 Unspecified atrial fibrillation: Secondary | ICD-10-CM | POA: Insufficient documentation

## 2023-01-27 DIAGNOSIS — R1084 Generalized abdominal pain: Secondary | ICD-10-CM | POA: Diagnosis not present

## 2023-01-27 LAB — CBC WITH DIFFERENTIAL/PLATELET
Abs Immature Granulocytes: 0.02 10*3/uL (ref 0.00–0.07)
Basophils Absolute: 0 10*3/uL (ref 0.0–0.1)
Basophils Relative: 0 %
Eosinophils Absolute: 0 10*3/uL (ref 0.0–0.5)
Eosinophils Relative: 1 %
HCT: 37.4 % (ref 36.0–46.0)
Hemoglobin: 11.3 g/dL — ABNORMAL LOW (ref 12.0–15.0)
Immature Granulocytes: 0 %
Lymphocytes Relative: 26 %
Lymphs Abs: 1.8 10*3/uL (ref 0.7–4.0)
MCH: 27 pg (ref 26.0–34.0)
MCHC: 30.2 g/dL (ref 30.0–36.0)
MCV: 89.3 fL (ref 80.0–100.0)
Monocytes Absolute: 0.5 10*3/uL (ref 0.1–1.0)
Monocytes Relative: 8 %
Neutro Abs: 4.4 10*3/uL (ref 1.7–7.7)
Neutrophils Relative %: 65 %
Platelets: 333 10*3/uL (ref 150–400)
RBC: 4.19 MIL/uL (ref 3.87–5.11)
RDW: 14.4 % (ref 11.5–15.5)
WBC: 6.8 10*3/uL (ref 4.0–10.5)
nRBC: 0 % (ref 0.0–0.2)

## 2023-01-27 LAB — COMPREHENSIVE METABOLIC PANEL
ALT: 12 U/L (ref 0–44)
AST: 14 U/L — ABNORMAL LOW (ref 15–41)
Albumin: 3.2 g/dL — ABNORMAL LOW (ref 3.5–5.0)
Alkaline Phosphatase: 87 U/L (ref 38–126)
Anion gap: 6 (ref 5–15)
BUN: 15 mg/dL (ref 8–23)
CO2: 28 mmol/L (ref 22–32)
Calcium: 8.9 mg/dL (ref 8.9–10.3)
Chloride: 102 mmol/L (ref 98–111)
Creatinine, Ser: 1.42 mg/dL — ABNORMAL HIGH (ref 0.44–1.00)
GFR, Estimated: 37 mL/min — ABNORMAL LOW (ref >60)
Glucose, Bld: 82 mg/dL (ref 70–99)
Potassium: 4 mmol/L (ref 3.5–5.1)
Sodium: 136 mmol/L (ref 135–145)
Total Bilirubin: 0.2 mg/dL (ref <1.2)
Total Protein: 7 g/dL (ref 6.5–8.1)

## 2023-01-27 LAB — URINALYSIS, ROUTINE W REFLEX MICROSCOPIC
Bilirubin Urine: NEGATIVE
Glucose, UA: NEGATIVE mg/dL
Hgb urine dipstick: NEGATIVE
Ketones, ur: NEGATIVE mg/dL
Nitrite: NEGATIVE
Protein, ur: 100 mg/dL — AB
Specific Gravity, Urine: 1.038 — ABNORMAL HIGH (ref 1.005–1.030)
pH: 5 (ref 5.0–8.0)

## 2023-01-27 LAB — PROTIME-INR
INR: 1.2 (ref 0.8–1.2)
Prothrombin Time: 15.6 s — ABNORMAL HIGH (ref 11.4–15.2)

## 2023-01-27 LAB — LIPASE, BLOOD: Lipase: 23 U/L (ref 11–51)

## 2023-01-27 MED ORDER — IOHEXOL 300 MG/ML  SOLN
100.0000 mL | Freq: Once | INTRAMUSCULAR | Status: AC | PRN
  Administered 2023-01-27: 80 mL via INTRAVENOUS

## 2023-01-27 NOTE — Discharge Instructions (Signed)
Please follow up with your primary care doctor within 2-3 days. For constipation we also recommend a diet high in fiber (beans, fruits, vegetables, whole grains). Take Colace 100-200 mg up to three times per day. You may take along with Senokot 1-2 tabs, ingest with full glass of water.  You may also take MiraLAX 1-2 capfuls 1-2 times a day until stools become soft and then slowly decrease the amount of MiraLAX used.  Maintain fluid intake 6-8 glasses per day. Please increase fibers in your diet. You may also take Milk of Magnesia 30 mL as needed for constipation, you may repeat in 2 hours again if no bowl movement.  It was a pleasure caring for you today in the emergency department.  Please return to the emergency department for any worsening or worrisome symptoms.  

## 2023-01-27 NOTE — Telephone Encounter (Signed)
Patients son is aware. 

## 2023-01-27 NOTE — ED Triage Notes (Signed)
Pt BIB ems from [facility] for possible bowl obstruction. Per EMS pt had severe abdominal pain this morning that has gone away. Per EMS facility did an xray report at bedside states ileus type pattern favored, obstruction not excluded. CT recommend. Unknown of last BM. Pt A & O times 2

## 2023-01-27 NOTE — ED Provider Notes (Signed)
[place] EMERGENCY DEPARTMENT AT [facility] Provider Note  CSN: [id number] Arrival date & time: 01/27/23 1755  Chief Complaint(s) Abdominal Pain  HPI [name] is a 82 y.o. female with past medical history as below, significant for A-fib on Coumadin, memory loss, chronic back pain, GERD, insomnia, hypertension, resides at SNF who presents to the ED with complaint of abdominal pain  She is a poor historian, family at bedside to provide history.  She did have abdominal pain over the past day or 2, unsure when her last bowel movement was, she been having nausea without vomiting occasionally.  XR at SNF concern for possible ileus versus bowel obstruction and sent to ED for evaluation.  No prior abdominal surgical history.  Denies any dysuria, no change to p.o. intake, no fevers, no vomiting but is having some nausea.  No abdominal trauma.  Past Medical History Past Medical History:  Diagnosis Date   Anemia, iron deficiency 2012   Evaluated by Dr. Darrick Penna; H&H of 9.3/30.8 with MCV-79 in 10/2010; 3/3 positive Hemoccult cards in 07/2011   Anxiety    was on Prozac for a couple of weeks   Atrial fibrillation (HCC)    takes Coumadin daily   Bruises easily    takes Coumadin   Chronic anticoagulation 07/21/2010   Chronic back pain    related to knee pain   Degenerative joint disease    Knees   Dementia (HCC)    Depression    Diabetes mellitus    takes Metformin daily   Gastroesophageal reflux disease    takes Omeprazole daily   Glaucoma    Glaucoma    uses eye drops at night   History of blood transfusion 1969   History of gout    was on medication but taken off of several months ago   Hx of colonic polyps    Hx of migraines    last one about a yr ago;takes Topamax daily   Hyperlipidemia    takes Pravastatin daily   Hypertension    takes Hyzaar daily and Propranolol    Insomnia    takes Restoril prn   Joint pain    Joint swelling    Memory loss    takes  donepezil   Migraine    Migraine    Nocturia    Overweight(278.02)    Pneumonia 1969   hx of   Pulmonary embolism (HCC) May 2012   acute presentation, bilateral PE   Skin spots-aging    Urinary frequency    Patient Active Problem List   Diagnosis Date Noted   Annual visit for general adult medical examination with abnormal findings 12/01/2022   Immunization due 12/01/2022   Hemorrhoids 12/01/2022   Sacral ulcer, limited to breakdown of skin (HCC) 12/01/2022   Chronic hip pain, right 12/01/2022   Malodorous urine 08/04/2022   Candidiasis of esophagus (HCC) 05/27/2022   Constipation 04/27/2022   Symptomatic anemia 04/20/2022   Benign hypertensive heart and CKD, 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), w CHF (HCC) 03/21/2022   DNR (do not resuscitate) 08/12/2021   Need for home health care 08/27/2020   Pain management contract discussed 03/15/2020   CKD stage G3b/A1, 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) 04/20/2019   Esophageal dysphagia 01/26/2019   Dysuria 04/12/2018   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   History of pulmonary embolism 01/25/2018   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   Encounter for chronic pain management 07/17/2016   Chronic pain syndrome 02/03/2016   Nausea  09/03/2014   Bilateral knee pain 04/23/2014   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   Right-sided low back pain with right-sided sciatica 07/02/2013   Seasonal allergies 04/11/2013   Urinary incontinence 04/11/2013   Metabolic syndrome X 12/22/2012   Chronic anticoagulation 12/22/2012   Vitamin D deficiency 11/05/2012   Memory loss 11/01/2012   Depression with anxiety 11/01/2012   Anemia, iron deficiency    Morbid obesity (HCC) 08/16/2008   Globus pharyngeus 01/26/2008   Hyperlipidemia LDL goal <100 05/30/2007   Migraine 05/30/2007   Essential hypertension 05/30/2007   Gastroesophageal reflux disease 05/30/2007   Home Medication(s) Prior to Admission medications    Medication Sig Start Date End Date Taking? Authorizing Provider  amLODipine (NORVASC) 10 MG tablet TAKE ONE TABLET BY MOUTH ONCE DAILY 05/14/22  Yes Kerri Perches, MD  atorvastatin (LIPITOR) 40 MG tablet TAKE 1 TABLET BY MOUTH ONCE DAILY. 05/14/22  Yes Kerri Perches, MD  azelastine (OPTIVAR) 0.05 % ophthalmic solution Place 1 drop into the right eye 2 (two) times daily. 03/18/22  Yes Kerri Perches, MD  busPIRone (BUSPAR) 7.5 MG tablet TAKE ONE TABLET BY MOUTH THREE TIMES DAILY 05/14/22  Yes Kerri Perches, MD  cloNIDine (CATAPRES) 0.2 MG tablet TAKE ONE TABLET BY MOUTH THREE TIMES DAILY 11/22/22  Yes Kerri Perches, MD  COMBIGAN 0.2-0.5 % ophthalmic solution Place 1 drop into both eyes 2 (two) times daily. 03/23/17  Yes [provider]  dabigatran (PRADAXA) 150 MG CAPS capsule TAKE ONE CAPSULE BY MOUTH EVERY TWELVE HOURS 11/22/22  Yes Kerri Perches, MD  docusate sodium (COLACE) 100 MG capsule Take 1 capsule (100 mg total) by mouth daily as needed for mild constipation. Patient taking differently: Take 100 mg by mouth 2 (two) times daily as needed for mild constipation. 12/01/22  Yes Kerri Perches, MD  donepezil (ARICEPT) 10 MG tablet TAKE ONE TABLET BY MOUTH AT BEDTIME 05/14/22  Yes Kerri Perches, MD  ferrous sulfate 325 (65 FE) MG EC tablet Take 1 tablet (325 mg total) by mouth 2 (two) times daily. 04/23/22 04/23/23 Yes Shah, Pratik D, DO  hydrOXYzine (ATARAX) 25 MG tablet TAKE ONE TABLET BY MOUTH THREE TIMES DAILY 11/22/22  Yes Kerri Perches, MD  imipramine (TOFRANIL) 50 MG tablet TAKE 2 TABLETS BY MOUTH AT BEDTIME 08/09/22  Yes Kerri Perches, MD  insulin NPH-regular Human (NOVOLIN 70/30) (70-30) 100 UNIT/ML injection INJECT 55 UNITS INTO THE SKIN TWICE DAILY WITH MEALS 09/24/22  Yes Kerri Perches, MD  meclizine (ANTIVERT) 25 MG tablet Take 1 tablet (25 mg total) by mouth 3 (three) times daily as needed for dizziness. 11/28/19  Yes Kerri Perches, MD  montelukast (SINGULAIR) 10 MG tablet TAKE ONE TABLET BY MOUTH AT BEDTIME 01/17/23  Yes Kerri Perches, MD  nystatin powder Apply 1 Application topically 2 (two) times daily.   Yes [provider]  olopatadine (PATANOL) 0.1 % ophthalmic solution Place 1 drop into both eyes 2 (two) times daily. 03/18/22  Yes Kerri Perches, MD  omeprazole (PRILOSEC) 40 MG capsule Take 1 capsule (40 mg total) by mouth daily. Patient taking differently: Take 40 mg by mouth in the morning and at bedtime. 12/01/22  Yes Kerri Perches, MD  oxyCODONE-acetaminophen (PERCOCET) 10-325 MG tablet Take 1 tablet by mouth every 6 (six) hours as needed for pain. 01/18/23  Yes Kommor, Madison, MD  topiramate (TOPAMAX) 100 MG tablet TAKE 1 TABLET BY MOUTH  TWICE DAILY 02/16/22  Yes Kerri Perches, MD  B-D ULTRAFINE III SHORT PEN 31G X 8 MM MISC USE AS DIRECTED WITH NOVOLIN TWICE DAILY 12/02/22   Kerri Perches, MD  BD INSULIN SYRINGE U/F 31G X 5/16" 1 ML MISC USE AS DIRECTED 05/14/22   Kerri Perches, MD  Continuous Glucose Receiver (DEXCOM G7 RECEIVER) DEVI Use to monitor blood sugar daily dx: E11.49 12/31/22   Kerri Perches, MD  Continuous Glucose Sensor (DEXCOM G7 SENSOR) MISC Use to monitor blood sugar daily dx: e11.49 12/31/22   Kerri Perches, MD  glucose blood (ACCU-CHEK AVIVA PLUS) test strip CHECK BLOOD SUGAR AS INSTRUCTED 09/01/22   Kerri Perches, MD  Insulin Pen Needle (ULTICARE MINI PEN NEEDLES) 31G X 6 MM MISC USE TO INJECT NOVOLIN TWICE DAILY. 01/14/22   Kerri Perches, MD  Insulin Syringe-Needle U-100 32G X 5/16" 1 ML MISC 1 each by Does not apply route in the morning and at bedtime. Use to inject insulin twice daily dx e11.65 05/16/19   Freddy Finner, NP  UNABLE TO FIND Standard wheelchair with elevating leg rests DX M25.561, M54.41 03/12/20   Kerri Perches, MD  UNABLE TO FIND Skilled Nursing to eval and treat as indicated. Will need routine lab  work and vital sign assessments. 08/14/20   Kerri Perches, MD  UNABLE TO FIND Home Health to evaluate and treat as indicated. Dx: Z74.2 08/27/20   Kerri Perches, MD  UNABLE TO FIND Med Name: toxassure 8 dx: PAIN MANAGEMENT CONTRACT Z08.89 09/10/21   Kerri Perches, MD                                                                                                                                    Past Surgical History Past Surgical History:  Procedure Laterality Date   BIOPSY  04/22/2019   Procedure: BIOPSY;  Surgeon: Corbin Ade, MD;  Location: [facility];  Service: Endoscopy;;  duodenum   BIOPSY  04/22/2022   Procedure: BIOPSY;  Surgeon: Dolores Frame, MD;  Location: [facility];  Service: Gastroenterology;;   CATARACT EXTRACTION W/PHACO Right 04/11/2012   Procedure: CATARACT EXTRACTION PHACO AND INTRAOCULAR LENS PLACEMENT (IOC);  Surgeon: Loraine Leriche T. Nile Riggs, MD;  Location: [facility];  Service: Ophthalmology;  Laterality: Right;  CDE=9.61   CATARACT EXTRACTION W/PHACO Left 12/26/2012   Procedure: CATARACT EXTRACTION PHACO AND INTRAOCULAR LENS PLACEMENT (IOC);  Surgeon: Loraine Leriche T. Nile Riggs, MD;  Location: [facility];  Service: Ophthalmology;  Laterality: Left;  CDE:7.74   COLONOSCOPY  Jan 2002; 2012   2002: Dr. Katrinka Blazing, ext. hemorrhoids, nl colon; 2012-adenomatous polyps, gastritis on EGD   COLONOSCOPY N/A 04/24/2019   Procedure: COLONOSCOPY;  Surgeon: Malissa Hippo, MD;  Location: [facility];  Service: Endoscopy;  Laterality: N/A;   CYSTOSCOPY W/ URETERAL STENT PLACEMENT Bilateral 02/25/2018   Procedure: CYSTOSCOPY WITH BILATERAL RETROGRADE PYELOGRAM/BILATERAL  URETERAL STENT PLACEMENT, BLADDER BIOPSIES WITH FULGERATION;  Surgeon: Jerilee Field, MD;  Location: [facility];  Service: Urology;  Laterality: Bilateral;   CYSTOSCOPY/URETEROSCOPY/HOLMIUM LASER/STENT PLACEMENT Bilateral 03/21/2018   Procedure: CYSTOSCOPY/URETEROSCOPY/HOLMIUM LASER/STENT PLACEMENT;  Surgeon: Jerilee Field, MD;  Location: [facility];  Service: Urology;  Laterality: Bilateral;  ONLY NEEDS 60 MIN   DILATION AND CURETTAGE OF UTERUS     ESOPHAGOGASTRODUODENOSCOPY  08/24/2011   ESOPHAGOGASTRODUODENOSCOPY; esophageal dilatation; Malissa Hippo, MD;   ESOPHAGOGASTRODUODENOSCOPY N/A 04/22/2019   Procedure: ESOPHAGOGASTRODUODENOSCOPY (EGD);  Surgeon: Corbin Ade, MD;  [facility]: [facility];  Service: Endoscopy;  Laterality: N/A;   ESOPHAGOGASTRODUODENOSCOPY (EGD) WITH PROPOFOL N/A 04/22/2022   Procedure: ESOPHAGOGASTRODUODENOSCOPY (EGD) WITH PROPOFOL;  Surgeon: Dolores Frame, MD;  [facility]: [facility];  Service: Gastroenterology;  Laterality: N/A;  with possible esophageal dilation   GIVENS CAPSULE STUDY  08/18/2011   Procedure: GIVENS CAPSULE STUDY;  Surgeon: Malissa Hippo, MD;  [facility]: [facility];  Service: Endoscopy;  Laterality: N/A;  730   HOT HEMOSTASIS  04/22/2022   Procedure: HOT HEMOSTASIS (ARGON PLASMA COAGULATION/BICAP);  Surgeon: Marguerita Merles, Reuel Boom, MD;  Location: [facility];  Service: Gastroenterology;;   KNEE ARTHROSCOPY W/ MENISCECTOMY  90's   Left   MALONEY DILATION  04/22/2019   Procedure: MALONEY DILATION;  Surgeon: Corbin Ade, MD;  [facility]: [facility];  Service: Endoscopy;;   ORIF ANKLE FRACTURE Right 90's   TONSILLECTOMY     TOTAL KNEE ARTHROPLASTY  10/20/2011   Procedure: TOTAL KNEE ARTHROPLASTY;  Surgeon: Loreta Ave, MD;  [facility];  Service: Orthopedics;  Laterality: Left;   UPPER GASTROINTESTINAL ENDOSCOPY     URETHRAL DILATION     Family History Family History  Problem Relation Age of Onset   Diabetes Mother    Hypertension Mother    Arthritis Mother    Heart failure Mother    Migraines Mother    Kidney failure Mother    Leukemia Father    Migraines Father    Kidney failure Father    Diabetes Sister    Hypertension Sister    Diabetes Brother    Hypertension Brother    Hypertension Brother     Hypertension Brother    Hypertension Brother    Diabetes Brother    Diabetes Brother    Diabetes Brother    Pulmonary embolism Sister    Migraines Sister    Kidney failure Brother    Colon cancer Neg Hx    Colon polyps Neg Hx     Social History Social History   Tobacco Use   Smoking status: Never   Smokeless tobacco: Never  Vaping Use   Vaping status: Never Used  Substance Use Topics   Alcohol use: No   Drug use: No   Allergies Penicillins, Fentanyl, Sulfonamide derivatives, Sulfur, and Codeine  Review of Systems Review of Systems  Unable to perform ROS: Dementia  Constitutional:  Negative for chills and fever.  Respiratory:  Negative for chest tightness and shortness of breath.   Gastrointestinal:  Positive for abdominal pain, constipation and nausea. Negative for vomiting.  Genitourinary:  Negative for difficulty urinating and dysuria.    Physical Exam Vital Signs  I have reviewed the triage vital signs BP 138/69   Pulse 79   Temp 98.2 F (36.8 C) (Oral)   Resp 17   Ht 5\' 6"  (1.676 m)   Wt 109.3 kg   SpO2 100%   BMI 38.90 kg/m  Physical Exam Vitals and nursing note reviewed.  Constitutional:      General: She is not in acute distress.    Appearance: Normal appearance. She is obese.  HENT:     Head: Normocephalic and atraumatic.     Right Ear: External ear normal.     Left Ear: External ear normal.     Nose: Nose normal.     Mouth/Throat:     Mouth: Mucous membranes are moist.  Eyes:     General: No scleral icterus.       Right eye: No discharge.        Left eye: No discharge.  Cardiovascular:     Rate and Rhythm: Normal rate and regular rhythm.     Pulses: Normal pulses.     Heart sounds: Normal heart sounds.  Pulmonary:     Effort: Pulmonary effort is normal. No respiratory distress.     Breath sounds: Normal breath sounds. No stridor.  Abdominal:     General: Abdomen is flat. There is no distension.     Palpations: Abdomen is soft.      Tenderness: There is generalized abdominal tenderness.  Musculoskeletal:     Cervical back: No rigidity.     Right lower leg: No edema.     Left lower leg: No edema.  Skin:    General: Skin is warm and dry.     Capillary Refill: Capillary refill takes less than 2 seconds.  Neurological:     Mental Status: She is alert. Mental status is at baseline.  Psychiatric:        Mood and Affect: Mood normal.        Behavior: Behavior normal. Behavior is cooperative.     ED Results and Treatments Labs (all labs ordered are listed, but only abnormal results are displayed) Labs Reviewed  COMPREHENSIVE METABOLIC PANEL - Abnormal; Notable for the following components:      Result Value   Creatinine, Ser 1.42 (*)    Albumin 3.2 (*)    AST 14 (*)    GFR, Estimated 37 (*)    All other components within normal limits  CBC WITH DIFFERENTIAL/PLATELET - Abnormal; Notable for the following components:   Hemoglobin 11.3 (*)    All other components within normal limits  PROTIME-INR - Abnormal; Notable for the following components:   Prothrombin Time 15.6 (*)    All other components within normal limits  LIPASE, BLOOD  URINALYSIS, ROUTINE W REFLEX MICROSCOPIC                                                                                                                          Radiology CT ABDOMEN PELVIS W CONTRAST Result Date: 01/27/2023 CLINICAL DATA:  Abdominal pain, acute, nonlocalized ?SBO EXAM: CT ABDOMEN AND PELVIS WITH CONTRAST TECHNIQUE: Multidetector CT imaging of the abdomen and pelvis was performed using the standard protocol following bolus administration of intravenous contrast. RADIATION DOSE REDUCTION: This exam was performed according to  the departmental dose-optimization program which includes automated exposure control, adjustment of the mA and/or kV according to patient size and/or use of iterative reconstruction technique. CONTRAST:  80mL OMNIPAQUE IOHEXOL 300 MG/ML  SOLN  COMPARISON:  03/17/2018 FINDINGS: Lower chest: No acute findings Hepatobiliary: No focal hepatic abnormality. Gallbladder unremarkable. Pancreas: No focal abnormality or ductal dilatation. Spleen: No focal abnormality.  Normal size. Adrenals/Urinary Tract: No adrenal abnormality. No focal renal abnormality. No stones or hydronephrosis. Urinary bladder is unremarkable. Stomach/Bowel: Normal appendix. Moderate stool burden throughout the colon. Stomach, large and small bowel grossly unremarkable. Vascular/Lymphatic: No evidence of aneurysm or adenopathy. Reproductive: Uterus and adnexa unremarkable.  No mass. Other: No free fluid or free air. Musculoskeletal: No acute bony abnormality. Degenerative disc and facet disease throughout the lumbar spine. Grade 1 anterolisthesis at L4-5. IMPRESSION: No acute findings in the abdomen or pelvis. Moderate stool burden throughout the colon. Electronically Signed   By: Charlett Nose M.D.   On: 01/27/2023 20:03    Pertinent labs & imaging results that were available during my care of the patient were reviewed by me and considered in my medical decision making (see MDM for details).  Medications Ordered in ED Medications  iohexol (OMNIPAQUE) 300 MG/ML solution 100 mL (80 mLs Intravenous Contrast Given 01/27/23 1934)                                                                                                                                     Procedures Procedures  (including critical care time)  Medical Decision Making / ED Course    Medical Decision Making:    CHAQUITA TENG is a 82 y.o. female with past medical history as below, significant for A-fib on Coumadin, memory loss/dementia, chronic back pain, GERD, insomnia, hypertension, resides at SNF who presents to the ED with complaint of abdominal pain. The complaint involves an extensive differential diagnosis and also carries with it a high risk of complications and morbidity.  Serious etiology was  considered. Ddx includes but is not limited to: Differential diagnosis includes but is not exclusive to acute cholecystitis, intrathoracic causes for epigastric abdominal pain, gastritis, duodenitis, pancreatitis, small bowel or large bowel obstruction, abdominal aortic aneurysm, hernia, gastritis, etc. Differential diagnosis includes but is not exclusive to ectopic pregnancy, ovarian cyst, ovarian torsion, acute appendicitis, urinary tract infection, endometriosis, bowel obstruction, hernia, colitis, renal colic, gastroenteritis, volvulus etc.   Complete initial physical exam performed, notably the patient was in no acute distress, abdomen nonperitoneal.    Reviewed and confirmed nursing documentation for past medical history, family history, social history.  Vital signs reviewed.    Patient from SNF, history of memory issues, at baseline AO x 2, hx dementia.  Will get screening labs, get CT imaging of the abdomen  Clinical Course as of 01/27/23 2322  Thu Jan 27, 2023  2033 CT stable [SG]  2033 Creatinine(!): 1.42 Similar to prior [SG]  2318  Feeling better, tolerating PO, HDS. Plan discharge back to snf [SG]    Clinical Course User Index [SG] Sloan Leiter, DO    Brief summary: 82 year old female history of dementia resides at SNF here with abnormal x-ray.  Abdominal pain, some nausea.  Labs stable, CT imaging with constipation.  Patient reports constipation.  No evidence of bowel obstruction or ileus.  She is feeling much better, tolerant p.o. without difficulty.  No longer having any nausea.  Plan discharge back to SNF with a bowel regimen, close outpatient follow-up was encouraged  The patient improved significantly and was discharged in stable condition. Detailed discussions were had with the patient regarding current findings, and need for close f/u with PCP or on call doctor. The patient has been instructed to return immediately if the symptoms worsen in any way for re-evaluation.  Patient verbalized understanding and is in agreement with current care plan. All questions answered prior to discharge.                  Additional history obtained: -Additional history obtained from family -External records from outside source obtained and reviewed including: Chart review including previous notes, labs, imaging, consultation notes including  Home medications, prior ED visits, prior labs   Lab Tests: -I ordered, reviewed, and interpreted labs.   The pertinent results include:   Labs Reviewed  COMPREHENSIVE METABOLIC PANEL - Abnormal; Notable for the following components:      Result Value   Creatinine, Ser 1.42 (*)    Albumin 3.2 (*)    AST 14 (*)    GFR, Estimated 37 (*)    All other components within normal limits  CBC WITH DIFFERENTIAL/PLATELET - Abnormal; Notable for the following components:   Hemoglobin 11.3 (*)    All other components within normal limits  PROTIME-INR - Abnormal; Notable for the following components:   Prothrombin Time 15.6 (*)    All other components within normal limits  LIPASE, BLOOD  URINALYSIS, ROUTINE W REFLEX MICROSCOPIC    Notable for labs stable  EKG   EKG Interpretation Date/Time:    Ventricular Rate:    PR Interval:    QRS Duration:    QT Interval:    QTC Calculation:   R Axis:      Text Interpretation:           Imaging Studies ordered: I ordered imaging studies including CT [facility] I independently visualized the following imaging with scope of interpretation limited to determining acute life threatening conditions related to emergency care; findings noted above I independently visualized and interpreted imaging. I agree with the radiologist interpretation   Medicines ordered and prescription drug management: Meds ordered this encounter  Medications   iohexol (OMNIPAQUE) 300 MG/ML solution 100 mL    -I have reviewed the patients home medicines and have made adjustments as  needed   Consultations Obtained: na   Cardiac Monitoring: Continuous pulse oximetry interpreted by myself, 97% on RA.    Social Determinants of Health:  Diagnosis or treatment significantly limited by social determinants of health: obesity   Reevaluation: After the interventions noted above, I reevaluated the patient and found that they have improved  Co morbidities that complicate the patient evaluation  Past Medical History:  Diagnosis Date   Anemia, iron deficiency 2012   Evaluated by Dr. Darrick Penna; H&H of 9.3/30.8 with MCV-79 in 10/2010; 3/3 positive Hemoccult cards in 07/2011   Anxiety    was on Prozac for a couple of weeks  Atrial fibrillation (HCC)    takes Coumadin daily   Bruises easily    takes Coumadin   Chronic anticoagulation 07/21/2010   Chronic back pain    related to knee pain   Degenerative joint disease    Knees   Dementia (HCC)    Depression    Diabetes mellitus    takes Metformin daily   Gastroesophageal reflux disease    takes Omeprazole daily   Glaucoma    Glaucoma    uses eye drops at night   History of blood transfusion 1969   History of gout    was on medication but taken off of several months ago   Hx of colonic polyps    Hx of migraines    last one about a yr ago;takes Topamax daily   Hyperlipidemia    takes Pravastatin daily   Hypertension    takes Hyzaar daily and Propranolol    Insomnia    takes Restoril prn   Joint pain    Joint swelling    Memory loss    takes donepezil   Migraine    Migraine    Nocturia    Overweight(278.02)    Pneumonia 1969   hx of   Pulmonary embolism [facility]) May 2012   acute presentation, bilateral PE   Skin spots-aging    Urinary frequency       Dispostion: Disposition decision including need for hospitalization was considered, and patient discharged from emergency department.    Final Clinical Impression(s) / ED Diagnoses Final diagnoses:  Constipation, unspecified constipation type   Abdominal pain, unspecified abdominal location        Sloan Leiter, DO 01/27/23 2323

## 2023-01-28 DIAGNOSIS — Z7401 Bed confinement status: Secondary | ICD-10-CM | POA: Diagnosis not present

## 2023-01-28 DIAGNOSIS — I10 Essential (primary) hypertension: Secondary | ICD-10-CM | POA: Diagnosis not present

## 2023-01-28 MED ORDER — CEPHALEXIN 500 MG PO CAPS: 500.0000 mg | ORAL_CAPSULE | Freq: Three times a day (TID) | ORAL | 0 refills | Status: AC

## 2023-01-28 MED ORDER — OXYCODONE-ACETAMINOPHEN 5-325 MG PO TABS
1.0000 | ORAL_TABLET | Freq: Once | ORAL | Status: AC
  Administered 2023-01-28: 1 via ORAL
  Filled 2023-01-28: qty 1

## 2023-01-29 LAB — URINE CULTURE: Culture: <10000 — AB

## 2023-01-31 DIAGNOSIS — F0390 Unspecified dementia without behavioral disturbance: Secondary | ICD-10-CM | POA: Diagnosis not present

## 2023-01-31 DIAGNOSIS — E7849 Other hyperlipidemia: Secondary | ICD-10-CM | POA: Diagnosis not present

## 2023-01-31 DIAGNOSIS — K5901 Slow transit constipation: Secondary | ICD-10-CM | POA: Diagnosis not present

## 2023-01-31 DIAGNOSIS — E119 Type 2 diabetes mellitus without complications: Secondary | ICD-10-CM | POA: Diagnosis not present

## 2023-02-01 DIAGNOSIS — F331 Major depressive disorder, recurrent, moderate: Secondary | ICD-10-CM | POA: Diagnosis not present

## 2023-02-02 DIAGNOSIS — N390 Urinary tract infection, site not specified: Secondary | ICD-10-CM | POA: Diagnosis not present

## 2023-02-02 DIAGNOSIS — E119 Type 2 diabetes mellitus without complications: Secondary | ICD-10-CM | POA: Diagnosis not present

## 2023-02-02 DIAGNOSIS — F0390 Unspecified dementia without behavioral disturbance: Secondary | ICD-10-CM | POA: Diagnosis not present

## 2023-02-02 DIAGNOSIS — D649 Anemia, unspecified: Secondary | ICD-10-CM | POA: Diagnosis not present

## 2023-02-02 DIAGNOSIS — L89312 Pressure ulcer of right buttock, stage 2: Secondary | ICD-10-CM | POA: Diagnosis not present

## 2023-02-02 DIAGNOSIS — R319 Hematuria, unspecified: Secondary | ICD-10-CM | POA: Diagnosis not present

## 2023-02-02 DIAGNOSIS — G8929 Other chronic pain: Secondary | ICD-10-CM | POA: Diagnosis not present

## 2023-02-02 DIAGNOSIS — M5441 Lumbago with sciatica, right side: Secondary | ICD-10-CM | POA: Diagnosis not present

## 2023-02-03 ENCOUNTER — Telehealth: Payer: Self-pay | Admitting: Family Medicine

## 2023-02-03 DIAGNOSIS — F0390 Unspecified dementia without behavioral disturbance: Secondary | ICD-10-CM | POA: Diagnosis not present

## 2023-02-03 DIAGNOSIS — K5901 Slow transit constipation: Secondary | ICD-10-CM | POA: Diagnosis not present

## 2023-02-03 DIAGNOSIS — J45998 Other asthma: Secondary | ICD-10-CM | POA: Diagnosis not present

## 2023-02-03 DIAGNOSIS — E119 Type 2 diabetes mellitus without complications: Secondary | ICD-10-CM | POA: Diagnosis not present

## 2023-02-03 DIAGNOSIS — M5441 Lumbago with sciatica, right side: Secondary | ICD-10-CM | POA: Diagnosis not present

## 2023-02-03 DIAGNOSIS — N390 Urinary tract infection, site not specified: Secondary | ICD-10-CM | POA: Diagnosis not present

## 2023-02-03 DIAGNOSIS — G8929 Other chronic pain: Secondary | ICD-10-CM | POA: Diagnosis not present

## 2023-02-03 DIAGNOSIS — D649 Anemia, unspecified: Secondary | ICD-10-CM | POA: Diagnosis not present

## 2023-02-03 DIAGNOSIS — H409 Unspecified glaucoma: Secondary | ICD-10-CM | POA: Diagnosis not present

## 2023-02-03 NOTE — Telephone Encounter (Signed)
Copied from CRM [id number]. Topic: General - Other >> Feb 03, 2023 10:40 AM Clayton Bibles wrote:  Reason for CRM: [facility] wants to know if Dr. Lodema Hong is willing to sign and follow home health care order. PT is in rehab now and will have an order when released. Call Back [phone] and you can leave voicemail

## 2023-02-03 NOTE — Telephone Encounter (Signed)
Verbal order given  

## 2023-02-04 NOTE — Telephone Encounter (Signed)
Copied from CRM [id number]. Topic: General - Other >> Feb 03, 2023  4:28 PM Maxwell Marion wrote: Reason for CRM: Steward Drone from [facility] [place] wants to let Dr. Lodema Hong know PT will be delayed until Monday and there is no OT available at this time. Her direct call back number is [phone]

## 2023-02-07 DIAGNOSIS — E785 Hyperlipidemia, unspecified: Secondary | ICD-10-CM | POA: Diagnosis not present

## 2023-02-07 DIAGNOSIS — M5441 Lumbago with sciatica, right side: Secondary | ICD-10-CM | POA: Diagnosis not present

## 2023-02-07 DIAGNOSIS — H409 Unspecified glaucoma: Secondary | ICD-10-CM | POA: Diagnosis not present

## 2023-02-07 DIAGNOSIS — D649 Anemia, unspecified: Secondary | ICD-10-CM | POA: Diagnosis not present

## 2023-02-07 DIAGNOSIS — E119 Type 2 diabetes mellitus without complications: Secondary | ICD-10-CM | POA: Diagnosis not present

## 2023-02-07 DIAGNOSIS — J45909 Unspecified asthma, uncomplicated: Secondary | ICD-10-CM | POA: Diagnosis not present

## 2023-02-07 DIAGNOSIS — F0394 Unspecified dementia, unspecified severity, with anxiety: Secondary | ICD-10-CM | POA: Diagnosis not present

## 2023-02-07 DIAGNOSIS — I10 Essential (primary) hypertension: Secondary | ICD-10-CM | POA: Diagnosis not present

## 2023-02-07 DIAGNOSIS — L89152 Pressure ulcer of sacral region, stage 2: Secondary | ICD-10-CM | POA: Diagnosis not present

## 2023-02-08 DIAGNOSIS — J45909 Unspecified asthma, uncomplicated: Secondary | ICD-10-CM | POA: Diagnosis not present

## 2023-02-08 DIAGNOSIS — F0394 Unspecified dementia, unspecified severity, with anxiety: Secondary | ICD-10-CM | POA: Diagnosis not present

## 2023-02-08 DIAGNOSIS — L89152 Pressure ulcer of sacral region, stage 2: Secondary | ICD-10-CM | POA: Diagnosis not present

## 2023-02-08 DIAGNOSIS — M5441 Lumbago with sciatica, right side: Secondary | ICD-10-CM | POA: Diagnosis not present

## 2023-02-08 DIAGNOSIS — H409 Unspecified glaucoma: Secondary | ICD-10-CM | POA: Diagnosis not present

## 2023-02-08 DIAGNOSIS — D649 Anemia, unspecified: Secondary | ICD-10-CM | POA: Diagnosis not present

## 2023-02-08 DIAGNOSIS — E785 Hyperlipidemia, unspecified: Secondary | ICD-10-CM | POA: Diagnosis not present

## 2023-02-08 DIAGNOSIS — E119 Type 2 diabetes mellitus without complications: Secondary | ICD-10-CM | POA: Diagnosis not present

## 2023-02-08 DIAGNOSIS — I10 Essential (primary) hypertension: Secondary | ICD-10-CM | POA: Diagnosis not present

## 2023-02-17 ENCOUNTER — Telehealth: Payer: Self-pay

## 2023-02-17 DIAGNOSIS — E119 Type 2 diabetes mellitus without complications: Secondary | ICD-10-CM | POA: Diagnosis not present

## 2023-02-17 DIAGNOSIS — E785 Hyperlipidemia, unspecified: Secondary | ICD-10-CM | POA: Diagnosis not present

## 2023-02-17 DIAGNOSIS — J45909 Unspecified asthma, uncomplicated: Secondary | ICD-10-CM | POA: Diagnosis not present

## 2023-02-17 DIAGNOSIS — I10 Essential (primary) hypertension: Secondary | ICD-10-CM | POA: Diagnosis not present

## 2023-02-17 DIAGNOSIS — D649 Anemia, unspecified: Secondary | ICD-10-CM | POA: Diagnosis not present

## 2023-02-17 DIAGNOSIS — L89152 Pressure ulcer of sacral region, stage 2: Secondary | ICD-10-CM | POA: Diagnosis not present

## 2023-02-17 DIAGNOSIS — M5441 Lumbago with sciatica, right side: Secondary | ICD-10-CM | POA: Diagnosis not present

## 2023-02-17 DIAGNOSIS — F0394 Unspecified dementia, unspecified severity, with anxiety: Secondary | ICD-10-CM | POA: Diagnosis not present

## 2023-02-17 DIAGNOSIS — H409 Unspecified glaucoma: Secondary | ICD-10-CM | POA: Diagnosis not present

## 2023-02-17 NOTE — Progress Notes (Signed)
 Transition Care Management Unsuccessful Follow-up Telephone Call  Date of discharge and from where:  [name] 12/13  Attempts:  1st Attempt  Reason for unsuccessful TCM follow-up call:  No answer/busy   [name] [place]  [facility], [facility] Guide, Phone: [phone] Website: [email or web address]

## 2023-02-18 ENCOUNTER — Telehealth: Payer: Self-pay

## 2023-02-18 NOTE — Progress Notes (Signed)
 Transition Care Management Unsuccessful Follow-up Telephone Call  Date of discharge and from where:  [name] 12/13  Attempts:  2nd Attempt  Reason for unsuccessful TCM follow-up call:  No answer/busy   [name] [place]  [facility], [facility] Guide, Phone: [phone] Website: [email or web address]

## 2023-02-22 DIAGNOSIS — M5441 Lumbago with sciatica, right side: Secondary | ICD-10-CM | POA: Diagnosis not present

## 2023-02-22 DIAGNOSIS — F0394 Unspecified dementia, unspecified severity, with anxiety: Secondary | ICD-10-CM | POA: Diagnosis not present

## 2023-02-22 DIAGNOSIS — E119 Type 2 diabetes mellitus without complications: Secondary | ICD-10-CM | POA: Diagnosis not present

## 2023-02-22 DIAGNOSIS — E785 Hyperlipidemia, unspecified: Secondary | ICD-10-CM | POA: Diagnosis not present

## 2023-02-22 DIAGNOSIS — L89152 Pressure ulcer of sacral region, stage 2: Secondary | ICD-10-CM | POA: Diagnosis not present

## 2023-02-22 DIAGNOSIS — I10 Essential (primary) hypertension: Secondary | ICD-10-CM | POA: Diagnosis not present

## 2023-02-22 DIAGNOSIS — H409 Unspecified glaucoma: Secondary | ICD-10-CM | POA: Diagnosis not present

## 2023-02-22 DIAGNOSIS — J45909 Unspecified asthma, uncomplicated: Secondary | ICD-10-CM | POA: Diagnosis not present

## 2023-02-22 DIAGNOSIS — D649 Anemia, unspecified: Secondary | ICD-10-CM | POA: Diagnosis not present

## 2023-02-23 DIAGNOSIS — J45909 Unspecified asthma, uncomplicated: Secondary | ICD-10-CM | POA: Diagnosis not present

## 2023-02-23 DIAGNOSIS — D649 Anemia, unspecified: Secondary | ICD-10-CM | POA: Diagnosis not present

## 2023-02-23 DIAGNOSIS — L89152 Pressure ulcer of sacral region, stage 2: Secondary | ICD-10-CM | POA: Diagnosis not present

## 2023-02-23 DIAGNOSIS — E785 Hyperlipidemia, unspecified: Secondary | ICD-10-CM | POA: Diagnosis not present

## 2023-02-23 DIAGNOSIS — E119 Type 2 diabetes mellitus without complications: Secondary | ICD-10-CM | POA: Diagnosis not present

## 2023-02-23 DIAGNOSIS — F0394 Unspecified dementia, unspecified severity, with anxiety: Secondary | ICD-10-CM | POA: Diagnosis not present

## 2023-02-23 DIAGNOSIS — H409 Unspecified glaucoma: Secondary | ICD-10-CM | POA: Diagnosis not present

## 2023-02-23 DIAGNOSIS — M5441 Lumbago with sciatica, right side: Secondary | ICD-10-CM | POA: Diagnosis not present

## 2023-02-23 DIAGNOSIS — I10 Essential (primary) hypertension: Secondary | ICD-10-CM | POA: Diagnosis not present

## 2023-02-24 DIAGNOSIS — E785 Hyperlipidemia, unspecified: Secondary | ICD-10-CM | POA: Diagnosis not present

## 2023-02-24 DIAGNOSIS — E119 Type 2 diabetes mellitus without complications: Secondary | ICD-10-CM | POA: Diagnosis not present

## 2023-02-24 DIAGNOSIS — M5441 Lumbago with sciatica, right side: Secondary | ICD-10-CM | POA: Diagnosis not present

## 2023-02-24 DIAGNOSIS — J45909 Unspecified asthma, uncomplicated: Secondary | ICD-10-CM | POA: Diagnosis not present

## 2023-02-24 DIAGNOSIS — L89152 Pressure ulcer of sacral region, stage 2: Secondary | ICD-10-CM | POA: Diagnosis not present

## 2023-02-24 DIAGNOSIS — D649 Anemia, unspecified: Secondary | ICD-10-CM | POA: Diagnosis not present

## 2023-02-24 DIAGNOSIS — H409 Unspecified glaucoma: Secondary | ICD-10-CM | POA: Diagnosis not present

## 2023-02-24 DIAGNOSIS — I10 Essential (primary) hypertension: Secondary | ICD-10-CM | POA: Diagnosis not present

## 2023-02-24 DIAGNOSIS — F0394 Unspecified dementia, unspecified severity, with anxiety: Secondary | ICD-10-CM | POA: Diagnosis not present

## 2023-02-25 ENCOUNTER — Other Ambulatory Visit: Payer: Self-pay | Admitting: Family Medicine

## 2023-03-01 ENCOUNTER — Other Ambulatory Visit: Payer: Self-pay | Admitting: Family Medicine

## 2023-03-01 DIAGNOSIS — D649 Anemia, unspecified: Secondary | ICD-10-CM | POA: Diagnosis not present

## 2023-03-01 DIAGNOSIS — J45909 Unspecified asthma, uncomplicated: Secondary | ICD-10-CM | POA: Diagnosis not present

## 2023-03-01 DIAGNOSIS — E119 Type 2 diabetes mellitus without complications: Secondary | ICD-10-CM | POA: Diagnosis not present

## 2023-03-01 DIAGNOSIS — L89152 Pressure ulcer of sacral region, stage 2: Secondary | ICD-10-CM | POA: Diagnosis not present

## 2023-03-01 DIAGNOSIS — F0394 Unspecified dementia, unspecified severity, with anxiety: Secondary | ICD-10-CM | POA: Diagnosis not present

## 2023-03-01 DIAGNOSIS — M5441 Lumbago with sciatica, right side: Secondary | ICD-10-CM | POA: Diagnosis not present

## 2023-03-01 DIAGNOSIS — E785 Hyperlipidemia, unspecified: Secondary | ICD-10-CM | POA: Diagnosis not present

## 2023-03-01 DIAGNOSIS — H409 Unspecified glaucoma: Secondary | ICD-10-CM | POA: Diagnosis not present

## 2023-03-01 DIAGNOSIS — I10 Essential (primary) hypertension: Secondary | ICD-10-CM | POA: Diagnosis not present

## 2023-03-02 DIAGNOSIS — F0394 Unspecified dementia, unspecified severity, with anxiety: Secondary | ICD-10-CM | POA: Diagnosis not present

## 2023-03-02 DIAGNOSIS — M5441 Lumbago with sciatica, right side: Secondary | ICD-10-CM | POA: Diagnosis not present

## 2023-03-02 DIAGNOSIS — D649 Anemia, unspecified: Secondary | ICD-10-CM | POA: Diagnosis not present

## 2023-03-02 DIAGNOSIS — E785 Hyperlipidemia, unspecified: Secondary | ICD-10-CM | POA: Diagnosis not present

## 2023-03-02 DIAGNOSIS — H409 Unspecified glaucoma: Secondary | ICD-10-CM | POA: Diagnosis not present

## 2023-03-02 DIAGNOSIS — J45909 Unspecified asthma, uncomplicated: Secondary | ICD-10-CM | POA: Diagnosis not present

## 2023-03-02 DIAGNOSIS — E119 Type 2 diabetes mellitus without complications: Secondary | ICD-10-CM | POA: Diagnosis not present

## 2023-03-02 DIAGNOSIS — I10 Essential (primary) hypertension: Secondary | ICD-10-CM | POA: Diagnosis not present

## 2023-03-02 DIAGNOSIS — L89152 Pressure ulcer of sacral region, stage 2: Secondary | ICD-10-CM | POA: Diagnosis not present

## 2023-03-03 DIAGNOSIS — E119 Type 2 diabetes mellitus without complications: Secondary | ICD-10-CM | POA: Diagnosis not present

## 2023-03-03 DIAGNOSIS — J45909 Unspecified asthma, uncomplicated: Secondary | ICD-10-CM | POA: Diagnosis not present

## 2023-03-03 DIAGNOSIS — L89152 Pressure ulcer of sacral region, stage 2: Secondary | ICD-10-CM | POA: Diagnosis not present

## 2023-03-03 DIAGNOSIS — I10 Essential (primary) hypertension: Secondary | ICD-10-CM | POA: Diagnosis not present

## 2023-03-03 DIAGNOSIS — M5441 Lumbago with sciatica, right side: Secondary | ICD-10-CM | POA: Diagnosis not present

## 2023-03-03 DIAGNOSIS — D649 Anemia, unspecified: Secondary | ICD-10-CM | POA: Diagnosis not present

## 2023-03-03 DIAGNOSIS — F0394 Unspecified dementia, unspecified severity, with anxiety: Secondary | ICD-10-CM | POA: Diagnosis not present

## 2023-03-03 DIAGNOSIS — H409 Unspecified glaucoma: Secondary | ICD-10-CM | POA: Diagnosis not present

## 2023-03-03 DIAGNOSIS — E785 Hyperlipidemia, unspecified: Secondary | ICD-10-CM | POA: Diagnosis not present

## 2023-03-08 DIAGNOSIS — E119 Type 2 diabetes mellitus without complications: Secondary | ICD-10-CM | POA: Diagnosis not present

## 2023-03-08 DIAGNOSIS — H409 Unspecified glaucoma: Secondary | ICD-10-CM | POA: Diagnosis not present

## 2023-03-08 DIAGNOSIS — F0394 Unspecified dementia, unspecified severity, with anxiety: Secondary | ICD-10-CM | POA: Diagnosis not present

## 2023-03-08 DIAGNOSIS — J45909 Unspecified asthma, uncomplicated: Secondary | ICD-10-CM | POA: Diagnosis not present

## 2023-03-08 DIAGNOSIS — M5441 Lumbago with sciatica, right side: Secondary | ICD-10-CM | POA: Diagnosis not present

## 2023-03-08 DIAGNOSIS — I10 Essential (primary) hypertension: Secondary | ICD-10-CM | POA: Diagnosis not present

## 2023-03-08 DIAGNOSIS — D649 Anemia, unspecified: Secondary | ICD-10-CM | POA: Diagnosis not present

## 2023-03-08 DIAGNOSIS — E785 Hyperlipidemia, unspecified: Secondary | ICD-10-CM | POA: Diagnosis not present

## 2023-03-08 DIAGNOSIS — L89152 Pressure ulcer of sacral region, stage 2: Secondary | ICD-10-CM | POA: Diagnosis not present

## 2023-03-09 DIAGNOSIS — E785 Hyperlipidemia, unspecified: Secondary | ICD-10-CM | POA: Diagnosis not present

## 2023-03-09 DIAGNOSIS — H409 Unspecified glaucoma: Secondary | ICD-10-CM | POA: Diagnosis not present

## 2023-03-09 DIAGNOSIS — F0394 Unspecified dementia, unspecified severity, with anxiety: Secondary | ICD-10-CM | POA: Diagnosis not present

## 2023-03-09 DIAGNOSIS — M5441 Lumbago with sciatica, right side: Secondary | ICD-10-CM | POA: Diagnosis not present

## 2023-03-09 DIAGNOSIS — J45909 Unspecified asthma, uncomplicated: Secondary | ICD-10-CM | POA: Diagnosis not present

## 2023-03-09 DIAGNOSIS — E119 Type 2 diabetes mellitus without complications: Secondary | ICD-10-CM | POA: Diagnosis not present

## 2023-03-09 DIAGNOSIS — L89152 Pressure ulcer of sacral region, stage 2: Secondary | ICD-10-CM | POA: Diagnosis not present

## 2023-03-09 DIAGNOSIS — D649 Anemia, unspecified: Secondary | ICD-10-CM | POA: Diagnosis not present

## 2023-03-09 DIAGNOSIS — I10 Essential (primary) hypertension: Secondary | ICD-10-CM | POA: Diagnosis not present

## 2023-03-15 DIAGNOSIS — E785 Hyperlipidemia, unspecified: Secondary | ICD-10-CM | POA: Diagnosis not present

## 2023-03-15 DIAGNOSIS — E119 Type 2 diabetes mellitus without complications: Secondary | ICD-10-CM | POA: Diagnosis not present

## 2023-03-15 DIAGNOSIS — H409 Unspecified glaucoma: Secondary | ICD-10-CM | POA: Diagnosis not present

## 2023-03-15 DIAGNOSIS — I10 Essential (primary) hypertension: Secondary | ICD-10-CM | POA: Diagnosis not present

## 2023-03-15 DIAGNOSIS — J45909 Unspecified asthma, uncomplicated: Secondary | ICD-10-CM | POA: Diagnosis not present

## 2023-03-15 DIAGNOSIS — F0394 Unspecified dementia, unspecified severity, with anxiety: Secondary | ICD-10-CM | POA: Diagnosis not present

## 2023-03-15 DIAGNOSIS — M5441 Lumbago with sciatica, right side: Secondary | ICD-10-CM | POA: Diagnosis not present

## 2023-03-15 DIAGNOSIS — D649 Anemia, unspecified: Secondary | ICD-10-CM | POA: Diagnosis not present

## 2023-03-15 DIAGNOSIS — L89152 Pressure ulcer of sacral region, stage 2: Secondary | ICD-10-CM | POA: Diagnosis not present

## 2023-03-16 DIAGNOSIS — J45909 Unspecified asthma, uncomplicated: Secondary | ICD-10-CM | POA: Diagnosis not present

## 2023-03-16 DIAGNOSIS — F0394 Unspecified dementia, unspecified severity, with anxiety: Secondary | ICD-10-CM | POA: Diagnosis not present

## 2023-03-16 DIAGNOSIS — E119 Type 2 diabetes mellitus without complications: Secondary | ICD-10-CM | POA: Diagnosis not present

## 2023-03-16 DIAGNOSIS — I10 Essential (primary) hypertension: Secondary | ICD-10-CM | POA: Diagnosis not present

## 2023-03-16 DIAGNOSIS — M5441 Lumbago with sciatica, right side: Secondary | ICD-10-CM | POA: Diagnosis not present

## 2023-03-16 DIAGNOSIS — D649 Anemia, unspecified: Secondary | ICD-10-CM | POA: Diagnosis not present

## 2023-03-16 DIAGNOSIS — L89152 Pressure ulcer of sacral region, stage 2: Secondary | ICD-10-CM | POA: Diagnosis not present

## 2023-03-16 DIAGNOSIS — H409 Unspecified glaucoma: Secondary | ICD-10-CM | POA: Diagnosis not present

## 2023-03-16 DIAGNOSIS — E785 Hyperlipidemia, unspecified: Secondary | ICD-10-CM | POA: Diagnosis not present

## 2023-03-17 DIAGNOSIS — E119 Type 2 diabetes mellitus without complications: Secondary | ICD-10-CM | POA: Diagnosis not present

## 2023-03-17 DIAGNOSIS — F0394 Unspecified dementia, unspecified severity, with anxiety: Secondary | ICD-10-CM | POA: Diagnosis not present

## 2023-03-17 DIAGNOSIS — I10 Essential (primary) hypertension: Secondary | ICD-10-CM | POA: Diagnosis not present

## 2023-03-17 DIAGNOSIS — L89152 Pressure ulcer of sacral region, stage 2: Secondary | ICD-10-CM | POA: Diagnosis not present

## 2023-03-17 DIAGNOSIS — J45909 Unspecified asthma, uncomplicated: Secondary | ICD-10-CM | POA: Diagnosis not present

## 2023-03-17 DIAGNOSIS — E785 Hyperlipidemia, unspecified: Secondary | ICD-10-CM | POA: Diagnosis not present

## 2023-03-17 DIAGNOSIS — H409 Unspecified glaucoma: Secondary | ICD-10-CM | POA: Diagnosis not present

## 2023-03-17 DIAGNOSIS — M5441 Lumbago with sciatica, right side: Secondary | ICD-10-CM | POA: Diagnosis not present

## 2023-03-17 DIAGNOSIS — D649 Anemia, unspecified: Secondary | ICD-10-CM | POA: Diagnosis not present

## 2023-03-18 ENCOUNTER — Other Ambulatory Visit: Payer: Self-pay | Admitting: Family Medicine

## 2023-03-21 DIAGNOSIS — E785 Hyperlipidemia, unspecified: Secondary | ICD-10-CM | POA: Diagnosis not present

## 2023-03-21 DIAGNOSIS — E119 Type 2 diabetes mellitus without complications: Secondary | ICD-10-CM | POA: Diagnosis not present

## 2023-03-21 DIAGNOSIS — J45909 Unspecified asthma, uncomplicated: Secondary | ICD-10-CM | POA: Diagnosis not present

## 2023-03-21 DIAGNOSIS — D649 Anemia, unspecified: Secondary | ICD-10-CM | POA: Diagnosis not present

## 2023-03-21 DIAGNOSIS — I10 Essential (primary) hypertension: Secondary | ICD-10-CM | POA: Diagnosis not present

## 2023-03-21 DIAGNOSIS — M5441 Lumbago with sciatica, right side: Secondary | ICD-10-CM | POA: Diagnosis not present

## 2023-03-21 DIAGNOSIS — L89152 Pressure ulcer of sacral region, stage 2: Secondary | ICD-10-CM | POA: Diagnosis not present

## 2023-03-21 DIAGNOSIS — H409 Unspecified glaucoma: Secondary | ICD-10-CM | POA: Diagnosis not present

## 2023-03-21 DIAGNOSIS — F0394 Unspecified dementia, unspecified severity, with anxiety: Secondary | ICD-10-CM | POA: Diagnosis not present

## 2023-03-23 ENCOUNTER — Telehealth: Payer: Self-pay

## 2023-03-23 ENCOUNTER — Other Ambulatory Visit: Payer: Self-pay | Admitting: Family Medicine

## 2023-03-23 MED ORDER — OXYCODONE-ACETAMINOPHEN 10-325 MG PO TABS: ORAL_TABLET | ORAL | 0 refills | Status: AC

## 2023-03-23 NOTE — Telephone Encounter (Signed)
 Copied from CRM [id number]. Topic: Clinical - Medication Question >> Mar 23, 2023 12:36 PM Deleta HERO wrote: Reason for CRM: PT's sister Lyn wanted to confirm if OxyCodone  has been sent to the pharmacy, advised the medication pends to be signed off by her provider. [name] would like this to be signed off before the end of the day if possible?

## 2023-03-23 NOTE — Telephone Encounter (Signed)
 Copied from CRM [id number]. Topic: Appointments - Appointment Scheduling >> Mar 23, 2023  9:36 AM Tonda B wrote: Patient/patient representative is calling to schedule an appointment. Refer to attachments for appointment information.  Patient wants to know if there is anyway she can get a refill without being seen for oxycodone    Chief Complaint: Medication Refill  Disposition: [] ED /[] Urgent Care (no appt availability in office) / [] Appointment(In office/virtual)/ []  [place] [facility]/ [] Home Care/ [] Refused Recommended Disposition /[] [place] [facility]/ [x]  Follow-up with PCP Additional Notes: Patient is requesting refill of medication Oxycodone  10-325 MG

## 2023-03-23 NOTE — Addendum Note (Signed)
Addended by: Kerri Perches on: 03/23/2023 02:46 PM   Modules accepted: Orders

## 2023-03-31 DIAGNOSIS — I10 Essential (primary) hypertension: Secondary | ICD-10-CM | POA: Diagnosis not present

## 2023-03-31 DIAGNOSIS — E119 Type 2 diabetes mellitus without complications: Secondary | ICD-10-CM | POA: Diagnosis not present

## 2023-03-31 DIAGNOSIS — L89152 Pressure ulcer of sacral region, stage 2: Secondary | ICD-10-CM | POA: Diagnosis not present

## 2023-03-31 DIAGNOSIS — J45909 Unspecified asthma, uncomplicated: Secondary | ICD-10-CM | POA: Diagnosis not present

## 2023-03-31 DIAGNOSIS — H409 Unspecified glaucoma: Secondary | ICD-10-CM | POA: Diagnosis not present

## 2023-03-31 DIAGNOSIS — M5441 Lumbago with sciatica, right side: Secondary | ICD-10-CM | POA: Diagnosis not present

## 2023-03-31 DIAGNOSIS — D649 Anemia, unspecified: Secondary | ICD-10-CM | POA: Diagnosis not present

## 2023-03-31 DIAGNOSIS — E785 Hyperlipidemia, unspecified: Secondary | ICD-10-CM | POA: Diagnosis not present

## 2023-03-31 DIAGNOSIS — F0394 Unspecified dementia, unspecified severity, with anxiety: Secondary | ICD-10-CM | POA: Diagnosis not present

## 2023-04-04 ENCOUNTER — Other Ambulatory Visit: Payer: Self-pay | Admitting: Family Medicine

## 2023-04-07 ENCOUNTER — Encounter: Payer: Self-pay | Admitting: Family Medicine

## 2023-04-07 ENCOUNTER — Telehealth: Payer: Medicare HMO | Admitting: Family Medicine

## 2023-04-07 ENCOUNTER — Ambulatory Visit: Payer: Medicare HMO | Admitting: Family Medicine

## 2023-04-07 DIAGNOSIS — E1149 Type 2 diabetes mellitus with other diabetic neurological complication: Secondary | ICD-10-CM | POA: Diagnosis not present

## 2023-04-07 DIAGNOSIS — N189 Chronic kidney disease, unspecified: Secondary | ICD-10-CM

## 2023-04-07 DIAGNOSIS — R1319 Other dysphagia: Secondary | ICD-10-CM | POA: Diagnosis not present

## 2023-04-07 DIAGNOSIS — R300 Dysuria: Secondary | ICD-10-CM

## 2023-04-07 DIAGNOSIS — I10 Essential (primary) hypertension: Secondary | ICD-10-CM

## 2023-04-07 DIAGNOSIS — M159 Polyosteoarthritis, unspecified: Secondary | ICD-10-CM

## 2023-04-07 DIAGNOSIS — E785 Hyperlipidemia, unspecified: Secondary | ICD-10-CM | POA: Diagnosis not present

## 2023-04-07 DIAGNOSIS — G8929 Other chronic pain: Secondary | ICD-10-CM

## 2023-04-07 MED ORDER — ACETAMINOPHEN ER 650 MG PO TBCR: EXTENDED_RELEASE_TABLET | ORAL | 5 refills | Status: DC

## 2023-04-07 NOTE — Patient Instructions (Addendum)
F/U in office  in 12 weeks, call if you need me sooner  Start tylenol 650 mg daily at lunchtime  Call if you need [place] for anything sooner   Please get fasting labs lipid, cmp and EGFR, hBA1c,cBC, and vit D in next  to 2 weeks will need to get them on site  Needs urine for culture as soon as possible due to c/o burning with urine  Careful not to fall again  ,condolence re your recent  loss  Thanks for choosing  Primary Care, we consider it a privelige to serve you.

## 2023-04-07 NOTE — Progress Notes (Signed)
 Virtual Visit via Video Note  I connected with [name] on 04/07/23 at  2:00 PM EST by a video enabled telemedicine application and verified that I am speaking with the correct person using two identifiers.  Location: Patient: home Provider: office   I discussed the limitations of evaluation and management by telemedicine and the availability of in person appointments. The patient expressed understanding and agreed to proceed.  Still has left buttock sore  hh nurse no longer checking on her   History of Present Illness: F/U chronic prob;lems  Sone who lives with her is primary caregiver c/o  increased generalized arthritic  pain , wants help with this Pressure ulcer still present on buttock however improved and managed without h/h nurse at this ime C/o malodorous urine , no fver chills , nausea or flank pain Appetite is good and BM regular    Observations/Objective:  There were no vitals taken for this visit. 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 Raybon is reminded of the importance of commitment to daily physical activity for 30 minutes or more, as able and the need to limit carbohydrate intake to 30 to 60 grams per meal to help with blood sugar control.  Updated lab needed at/ before next visit.  The need to take medication as prescribed, test blood sugar as directed, and to call between visits if there is a concern that blood sugar is uncontrolled is also discussed.   Ms. Nikolic is reminded of the importance of daily foot exam, annual eye examination, and good blood sugar, blood pressure and cholesterol control.     Latest Ref Rng & Units 01/27/2023    6:21 PM 01/14/2023   10:57 PM 12/01/2022    3:08 PM 07/29/2022    6:52 PM 07/28/2022    2:25 PM  Diabetic Labs  HbA1c 4.8 -  5.6 %   7.1   6.9   Micro/Creat Ratio 0 - 29 mg/g creat     169   Chol 100 - 199 mg/dL     161   HDL >09 mg/dL     61   Calc LDL 0 - 99 mg/dL     65   Triglycerides 0 - 149 mg/dL     604   Creatinine 5.40 - 1.00 mg/dL 9.81  1.91  4.78  2.95  1.38       01/28/2023   12:30 AM 01/28/2023   12:00 AM 01/27/2023   11:00 PM 01/27/2023    6:07 PM 01/27/2023    6:05 PM 01/19/2023    4:00 AM 01/19/2023    2:00 AM  BP/Weight  Systolic BP 136 150 138  157 117 157  Diastolic BP 98 69 69  72 87 72  Wt. (Lbs)    240.99     BMI    38.9 kg/m2         Latest Ref Rng & Units 08/19/2020   12:00 AM 11/28/2019    1:40 PM  Foot/eye exam completion dates  Eye Exam No Retinopathy No Retinopathy       Foot Form Completion   Done     This result is from an external source.        Encounter for chronic pain management The patient's Controlled Substance registry is reviewed and compliance confirmed. Adequacy of  Pain control and  level of function is assessed. Medication dosing is adjusted as deemed appropriate. Twelve weeks of medication is prescribed , with a follow up appointment between 11 to 12 weeks .   Esophageal dysphagia Increase PPI to twice daily  Essential hypertension DASH diet and commitment to daily physical activity for a minimum of 30 minutes discussed and encouraged, as a part of hypertension management. The importance of attaining a healthy weight is also discussed.     01/28/2023   12:30 AM 01/28/2023   12:00 AM 01/27/2023   11:00 PM 01/27/2023    6:07 PM 01/27/2023    6:05 PM 01/19/2023    4:00 AM 01/19/2023    2:00 AM  BP/Weight  Systolic BP 136 150 138  157 117 157  Diastolic BP 98 69 69  72 87 72  Wt. (Lbs)    240.99     BMI    38.9 kg/m2        'In office eval needed  Dysuria CCUA and c/s as soon as possible  Hyperlipidemia LDL goal <100 Hyperlipidemia:Low fat diet discussed and encouraged.   Lipid Panel  Lab Results  Component Value Date   CHOL  152 07/28/2022   HDL 61 07/28/2022   LDLCALC 65 07/28/2022   TRIG 151 (H) 07/28/2022   CHOLHDL 2.5 07/28/2022     Updated lab needed at/ before next visit.   Generalized osteoarthritis Increased generalized pain, start tylenol 650 mg at lunchtime   Follow Up Instructions:    I discussed the assessment and treatment plan with the patient. The patient was provided an opportunity to ask questions and all were answered. The patient agreed with the plan and demonstrated an understanding of the instructions.   The patient was advised to call back or seek an in-person evaluation if the symptoms worsen or if the condition fails to improve as anticipated.  I provided 24 minutes of non-face-to-face time during this encounter.   Syliva Overman, MD

## 2023-04-11 ENCOUNTER — Other Ambulatory Visit: Payer: Self-pay | Admitting: Family Medicine

## 2023-04-12 NOTE — Telephone Encounter (Signed)
 Copied from CRM [id number]. Topic: Clinical - Request for Lab/Test Order >> Apr 12, 2023  8:50 AM Elle L wrote: Reason for CRM: The patient's son, Ewell Poe, advised that Dr. Lodema Hong was going to set up services for the patient to have labs and a urinalysis done at her home and was requesting an update. His call back number is [phone].

## 2023-04-15 ENCOUNTER — Other Ambulatory Visit: Payer: Self-pay | Admitting: Family Medicine

## 2023-04-15 ENCOUNTER — Telehealth: Payer: Self-pay | Admitting: Family Medicine

## 2023-04-15 DIAGNOSIS — D509 Iron deficiency anemia, unspecified: Secondary | ICD-10-CM

## 2023-04-15 DIAGNOSIS — E785 Hyperlipidemia, unspecified: Secondary | ICD-10-CM

## 2023-04-15 DIAGNOSIS — E1149 Type 2 diabetes mellitus with other diabetic neurological complication: Secondary | ICD-10-CM

## 2023-04-15 DIAGNOSIS — E559 Vitamin D deficiency, unspecified: Secondary | ICD-10-CM

## 2023-04-15 NOTE — Telephone Encounter (Signed)
 I faxed the order to them. They will usually do it on one of their scheduled visits.  He needs to ask them about it and they will let him know if they are able. If they are not, she will need to have it at the office

## 2023-04-15 NOTE — Telephone Encounter (Signed)
 Please let him know I faxed the lab order to [facility] to see if they could draw it. If they cannot, they will have to come to our lab to get them drawn per the after visit summary from Dr Lodema Hong

## 2023-04-15 NOTE — Telephone Encounter (Signed)
 Copied from CRM [id number]. Topic: Appointments - Appointment Scheduling >> Apr 15, 2023  8:34 AM Wynona Canes G wrote: Ewell Poe son called.. waiting on call back.. waiting on bloodwork call back.Marland Kitchen arrangement to come to the house.. still never heard back from last week.. Please contact him he needs to know - concerned hasn't heard would like call back.. he is aware her doctor will meet discuss.  His number [phone] anytime to call him.

## 2023-04-15 NOTE — Telephone Encounter (Signed)
 Informed pt son- he's still confused on if [name] will let him know if they are coming or not.

## 2023-04-15 NOTE — Telephone Encounter (Signed)
 Pt son said they stopped scheduled visits weeks ago but he will try to call them.

## 2023-04-20 ENCOUNTER — Other Ambulatory Visit: Payer: Self-pay | Admitting: Family Medicine

## 2023-04-20 ENCOUNTER — Telehealth: Payer: Self-pay | Admitting: Family Medicine

## 2023-04-20 MED ORDER — OMEPRAZOLE 40 MG PO CPDR: 40.0000 mg | DELAYED_RELEASE_CAPSULE | Freq: Two times a day (BID) | ORAL | 3 refills | Status: DC

## 2023-04-20 MED ORDER — OXYCODONE-ACETAMINOPHEN 10-325 MG PO TABS: ORAL_TABLET | ORAL | 0 refills | Status: AC

## 2023-04-20 NOTE — Telephone Encounter (Signed)
 Copied from CRM [id number]. Topic: General - Other >> Apr 20, 2023  8:25 AM Whitney O wrote: Reason for CRM: Patient son is calling cause Dr. Lodema Hong said she would arrange for someone to come to the home to get blood sample and urine sample and no one has came . And they said they have ordered with [facility] . And they said if I haven't heard from anyone to call back and no one has contacted me from [facility] for blood sample and urine sample . Dr. Lodema Hong wanted to do lab work . Please reach out to patient son  [phone] Mr [name]

## 2023-04-20 NOTE — Assessment & Plan Note (Signed)
 Diabetes associated with hypertension, hyperlipidemia, CKD, and arthritis  [name] is reminded of the importance of commitment to daily physical activity for 30 minutes or more, as able and the need to limit carbohydrate intake to 30 to 60 grams per meal to help with blood sugar control.  Updated lab needed at/ before next visit.  The need to take medication as prescribed, test blood sugar as directed, and to call between visits if there is a concern that blood sugar is uncontrolled is also discussed.   [name] is reminded of the importance of daily foot exam, annual eye examination, and good blood sugar, blood pressure and cholesterol control.     Latest Ref Rng & Units 01/27/2023    6:21 PM 01/14/2023   10:57 PM 12/01/2022    3:08 PM 07/29/2022    6:52 PM 07/28/2022    2:25 PM  Diabetic Labs  HbA1c 4.8 - 5.6 %   7.1   6.9   Micro/Creat Ratio 0 - 29 mg/g creat     169   Chol 100 - 199 mg/dL     161   HDL >09 mg/dL     61   Calc LDL 0 - 99 mg/dL     65   Triglycerides 0 - 149 mg/dL     604   Creatinine 5.40 - 1.00 mg/dL 9.81  1.91  4.78  2.95  1.38       01/28/2023   12:30 AM 01/28/2023   12:00 AM 01/27/2023   11:00 PM 01/27/2023    6:07 PM 01/27/2023    6:05 PM 01/19/2023    4:00 AM 01/19/2023    2:00 AM  BP/Weight  Systolic BP 136 150 138  157 117 157  Diastolic BP 98 69 69  72 87 72  Wt. (Lbs)    240.99     BMI    38.9 kg/m2         Latest Ref Rng & Units 08/19/2020   12:00 AM 11/28/2019    1:40 PM  Foot/eye exam completion dates  Eye Exam No Retinopathy No Retinopathy       Foot Form Completion   Done     This result is from an external source.

## 2023-04-20 NOTE — Assessment & Plan Note (Signed)
Increase PPI to twice daily 

## 2023-04-20 NOTE — Assessment & Plan Note (Signed)
 Hyperlipidemia:Low fat diet discussed and encouraged.   Lipid Panel  Lab Results  Component Value Date   CHOL 152 07/28/2022   HDL 61 07/28/2022   LDLCALC 65 07/28/2022   TRIG 151 (H) 07/28/2022   CHOLHDL 2.5 07/28/2022     Updated lab needed at/ before next visit.

## 2023-04-20 NOTE — Assessment & Plan Note (Signed)
 Increased generalized pain, start tylenol 650 mg at lunchtime

## 2023-04-20 NOTE — Telephone Encounter (Signed)
 Son has called back multiple times, please let him know that [facility] home health is no longer seeing [name] as of 03/31/23 so they will not be able to draw her blood. They will only do it if she is receiving services. They will need to bring [name] to the [facility] location and have the labs and the urine test done. Make sure they tell them its for labs and urine so they will do both.

## 2023-04-20 NOTE — Telephone Encounter (Unsigned)
 Copied from CRM [id number]. Topic: Clinical - Medication Refill >> Apr 20, 2023  8:16 AM Dimitri Ped wrote: Most Recent Primary Care Visit:  Provider: Kerri Perches  Department: [facility]  Visit Type: OFFICE VISIT  Date: 04/07/2023  Medication: oxyCODONE-acetaminophen (PERCOCET) 10-325 MG tablet dabigatran (PRADAXA) 150 MG CAPS capsule   Has the patient contacted their pharmacy? Yes pharmacy told patient son to contact dr simpson (Agent: If no, request that the patient contact the pharmacy for the refill. If patient does not wish to contact the pharmacy document the reason why and proceed with request.) (Agent: If yes, when and what did the pharmacy advise?)  Is this the correct pharmacy for this prescription? Yes If no, delete pharmacy and type the correct one.  This is the patient's preferred pharmacy:  [facility] - [place], [place] - [address] [place] [address] Phone: [phone] Fax: [phone]  [facility] [address] [place] - [address] [place] [address] [place] [address] [place] [address] Phone: [phone] Fax: [phone]   Has the prescription been filled recently? No  Is the patient out of the medication? Yes 1 day of medication left oxycodone will be out tomorrow  Has the patient been seen for an appointment in the last year OR does the patient have an upcoming appointment? Yes  Can we respond through MyChart? No  Agent: Please be advised that Rx refills may take up to 3 business days. We ask that you follow-up with your pharmacy.

## 2023-04-20 NOTE — Assessment & Plan Note (Signed)
 CCUA and c/s as soon as possible

## 2023-04-20 NOTE — Assessment & Plan Note (Signed)
 The patient's Controlled Substance registry is reviewed and compliance confirmed. Adequacy of  Pain control and level of function is assessed. Medication dosing is adjusted as deemed appropriate. Twelve weeks of medication is prescribed , with a follow up appointment between 11 to 12 weeks .

## 2023-04-20 NOTE — Telephone Encounter (Signed)
 Called and spoke with son informed him of message below

## 2023-04-20 NOTE — Assessment & Plan Note (Signed)
 DASH diet and commitment to daily physical activity for a minimum of 30 minutes discussed and encouraged, as a part of hypertension management. The importance of attaining a healthy weight is also discussed.     01/28/2023   12:30 AM 01/28/2023   12:00 AM 01/27/2023   11:00 PM 01/27/2023    6:07 PM 01/27/2023    6:05 PM 01/19/2023    4:00 AM 01/19/2023    2:00 AM  BP/Weight  Systolic BP 136 150 138  157 117 157  Diastolic BP 98 69 69  72 87 72  Wt. (Lbs)    240.99     BMI    38.9 kg/m2        'In office eval needed

## 2023-04-25 DIAGNOSIS — E559 Vitamin D deficiency, unspecified: Secondary | ICD-10-CM | POA: Diagnosis not present

## 2023-04-25 DIAGNOSIS — E1149 Type 2 diabetes mellitus with other diabetic neurological complication: Secondary | ICD-10-CM | POA: Diagnosis not present

## 2023-04-25 DIAGNOSIS — E785 Hyperlipidemia, unspecified: Secondary | ICD-10-CM | POA: Diagnosis not present

## 2023-04-25 DIAGNOSIS — R300 Dysuria: Secondary | ICD-10-CM | POA: Diagnosis not present

## 2023-04-25 DIAGNOSIS — D509 Iron deficiency anemia, unspecified: Secondary | ICD-10-CM | POA: Diagnosis not present

## 2023-04-26 LAB — CBC WITH DIFFERENTIAL/PLATELET
Basophils Absolute: 0 10*3/uL (ref 0.0–0.2)
Basos: 0 %
EOS (ABSOLUTE): 0 10*3/uL (ref 0.0–0.4)
Eos: 0 %
Hematocrit: 34.8 % (ref 34.0–46.6)
Hemoglobin: 11.3 g/dL (ref 11.1–15.9)
Immature Grans (Abs): 0.1 10*3/uL (ref 0.0–0.1)
Immature Granulocytes: 1 %
Lymphocytes Absolute: 2.1 10*3/uL (ref 0.7–3.1)
Lymphs: 20 %
MCH: 26.8 pg (ref 26.6–33.0)
MCHC: 32.5 g/dL (ref 31.5–35.7)
MCV: 83 fL (ref 79–97)
Monocytes Absolute: 0.6 10*3/uL (ref 0.1–0.9)
Monocytes: 6 %
Neutrophils Absolute: 7.4 10*3/uL — ABNORMAL HIGH (ref 1.4–7.0)
Neutrophils: 73 %
Platelets: 336 10*3/uL (ref 150–450)
RBC: 4.21 x10E6/uL (ref 3.77–5.28)
RDW: 13.7 % (ref 11.7–15.4)
WBC: 10.2 10*3/uL (ref 3.4–10.8)

## 2023-04-26 LAB — CMP14+EGFR
ALT: 10 IU/L (ref 0–32)
AST: 14 IU/L (ref 0–40)
Albumin: 3.7 g/dL (ref 3.7–4.7)
Alkaline Phosphatase: 118 IU/L (ref 44–121)
BUN/Creatinine Ratio: 11 — ABNORMAL LOW (ref 12–28)
BUN: 15 mg/dL (ref 8–27)
Bilirubin Total: <0.2 mg/dL (ref 0.0–1.2)
CO2: 22 mmol/L (ref 20–29)
Calcium: 8.9 mg/dL (ref 8.7–10.3)
Chloride: 104 mmol/L (ref 96–106)
Creatinine, Ser: 1.34 mg/dL — ABNORMAL HIGH (ref 0.57–1.00)
Globulin, Total: 2.8 g/dL (ref 1.5–4.5)
Glucose: 120 mg/dL — ABNORMAL HIGH (ref 70–99)
Potassium: 4.2 mmol/L (ref 3.5–5.2)
Sodium: 138 mmol/L (ref 134–144)
Total Protein: 6.5 g/dL (ref 6.0–8.5)
eGFR: 39 mL/min/{1.73_m2} — ABNORMAL LOW (ref >59)

## 2023-04-26 LAB — VITAMIN D 25 HYDROXY (VIT D DEFICIENCY, FRACTURES): Vit D, 25-Hydroxy: 41.3 ng/mL (ref 30.0–100.0)

## 2023-04-26 LAB — LIPID PANEL
Chol/HDL Ratio: 2.8 ratio (ref 0.0–4.4)
Cholesterol, Total: 127 mg/dL (ref 100–199)
HDL: 45 mg/dL (ref >39)
LDL Chol Calc (NIH): 48 mg/dL (ref 0–99)
Triglycerides: 213 mg/dL — ABNORMAL HIGH (ref 0–149)
VLDL Cholesterol Cal: 34 mg/dL (ref 5–40)

## 2023-04-26 LAB — HEMOGLOBIN A1C
Est. average glucose Bld gHb Est-mCnc: 146 mg/dL
Hgb A1c MFr Bld: 6.7 % — ABNORMAL HIGH (ref 4.8–5.6)

## 2023-04-28 ENCOUNTER — Other Ambulatory Visit: Payer: Self-pay | Admitting: Family Medicine

## 2023-04-28 LAB — URINE CULTURE

## 2023-04-29 MED ORDER — NITROFURANTOIN MONOHYD MACRO 100 MG PO CAPS: 100.0000 mg | ORAL_CAPSULE | Freq: Two times a day (BID) | ORAL | 0 refills | Status: DC

## 2023-04-29 NOTE — Addendum Note (Signed)
 Addended by: Kerri Perches on: 04/29/2023 05:07 AM   Modules accepted: Orders

## 2023-05-13 ENCOUNTER — Other Ambulatory Visit: Payer: Self-pay | Admitting: Family Medicine

## 2023-05-27 ENCOUNTER — Other Ambulatory Visit: Payer: Self-pay | Admitting: Family Medicine

## 2023-06-15 DIAGNOSIS — H524 Presbyopia: Secondary | ICD-10-CM | POA: Diagnosis not present

## 2023-06-21 ENCOUNTER — Encounter: Payer: Self-pay | Admitting: Family Medicine

## 2023-06-21 ENCOUNTER — Ambulatory Visit (INDEPENDENT_AMBULATORY_CARE_PROVIDER_SITE_OTHER): Payer: Self-pay | Admitting: Family Medicine

## 2023-06-21 VITALS — BP 140/84 | HR 77 | Resp 16 | Ht 66.0 in

## 2023-06-21 DIAGNOSIS — N390 Urinary tract infection, site not specified: Secondary | ICD-10-CM

## 2023-06-21 DIAGNOSIS — I10 Essential (primary) hypertension: Secondary | ICD-10-CM

## 2023-06-21 DIAGNOSIS — G8929 Other chronic pain: Secondary | ICD-10-CM | POA: Diagnosis not present

## 2023-06-21 DIAGNOSIS — K5903 Drug induced constipation: Secondary | ICD-10-CM | POA: Diagnosis not present

## 2023-06-21 DIAGNOSIS — R300 Dysuria: Secondary | ICD-10-CM

## 2023-06-21 DIAGNOSIS — E1149 Type 2 diabetes mellitus with other diabetic neurological complication: Secondary | ICD-10-CM | POA: Diagnosis not present

## 2023-06-21 DIAGNOSIS — F418 Other specified anxiety disorders: Secondary | ICD-10-CM | POA: Diagnosis not present

## 2023-06-21 DIAGNOSIS — K219 Gastro-esophageal reflux disease without esophagitis: Secondary | ICD-10-CM | POA: Diagnosis not present

## 2023-06-21 DIAGNOSIS — N1832 Chronic kidney disease, stage 3b: Secondary | ICD-10-CM

## 2023-06-21 DIAGNOSIS — Z742 Need for assistance at home and no other household member able to render care: Secondary | ICD-10-CM | POA: Diagnosis not present

## 2023-06-21 DIAGNOSIS — E785 Hyperlipidemia, unspecified: Secondary | ICD-10-CM

## 2023-06-21 MED ORDER — NOVOLIN 70/30 (70-30) 100 UNIT/ML ~~LOC~~ SUSP: SUBCUTANEOUS | 5 refills | Status: DC

## 2023-06-21 MED ORDER — SENNOSIDES 8.6 MG PO TABS: 1.0000 | ORAL_TABLET | Freq: Two times a day (BID) | ORAL | 11 refills | Status: AC

## 2023-06-21 MED ORDER — DOCUSATE SODIUM 100 MG PO CAPS: 100.0000 mg | ORAL_CAPSULE | Freq: Two times a day (BID) | ORAL | 11 refills | Status: AC

## 2023-06-21 NOTE — Patient Instructions (Addendum)
 F/u IN 4 MONTHS, CALL IF YOU N EED ME BEFORE  DECREASE EVENING INSULIN  TO 50 UNITS, CONTINUE 55 UNITS IN THE MORNING  NON FASTING HBA1C , CHEM 7 AND EGFR 3 TO 5 DAYS BEFORE NEXT APPOINTMENT  TEST BEFORE BREAKFAST AND AROUND 8;30 AT NIGHT MORNING RANGE 90 TO 130 BEDTIME RANGE 130 TO 180  INCREASE COLACE TO ONE TWICE DAILY nEW IS SENOKOT ONE TWICE DAILY  NO NEW ADDITIONAL MEDICATION FOR PAIN IN LEGS JUST USE TOPICAL RUBS AND CREAMS  PLEASE RETURN URINE FOR C/S ONLY, WE WILL GIVE  YOU EMPTY STERILE CUP TO TAKE HOME  YOU ARE  B EING REFERRED TO UROLOGY DUE TO RECURRENT UTI  You are being referred to case management to see if any assistance at home can be obtained  Careful not to fall Watch skin closely  Thanks for choosing [facility], we consider it a privelige to serve you.

## 2023-06-23 ENCOUNTER — Other Ambulatory Visit: Payer: Self-pay | Admitting: Family Medicine

## 2023-06-24 DIAGNOSIS — R300 Dysuria: Secondary | ICD-10-CM | POA: Diagnosis not present

## 2023-06-28 ENCOUNTER — Other Ambulatory Visit: Payer: Self-pay | Admitting: Family Medicine

## 2023-06-28 ENCOUNTER — Encounter: Payer: Self-pay | Admitting: Family Medicine

## 2023-06-28 ENCOUNTER — Ambulatory Visit: Payer: Self-pay

## 2023-06-28 DIAGNOSIS — N390 Urinary tract infection, site not specified: Secondary | ICD-10-CM | POA: Insufficient documentation

## 2023-06-28 LAB — URINE CULTURE

## 2023-06-28 MED ORDER — OXYCODONE-ACETAMINOPHEN 10-325 MG PO TABS: ORAL_TABLET | ORAL | 0 refills | Status: AC

## 2023-06-28 MED ORDER — OXYCODONE-ACETAMINOPHEN 10-325 MG PO TABS: ORAL_TABLET | ORAL | 0 refills | Status: DC

## 2023-06-28 MED ORDER — CIPROFLOXACIN HCL 500 MG PO TABS: 500.0000 mg | ORAL_TABLET | Freq: Two times a day (BID) | ORAL | 0 refills | Status: AC

## 2023-06-28 NOTE — Assessment & Plan Note (Signed)
 The patient's Controlled Substance registry is reviewed and compliance confirmed. Adequacy of  Pain control and level of function is assessed. Medication dosing is adjusted as deemed appropriate. Twelve weeks of medication is prescribed , with a follow up appointment between 11 to 12 weeks .

## 2023-06-28 NOTE — Assessment & Plan Note (Signed)
 Controlled, no change in medication

## 2023-06-28 NOTE — Assessment & Plan Note (Signed)
 Sub optimal but adequately Controlled, no change in medication DASH diet and commitment to daily physical activity for a minimum of 30 minutes discussed and encouraged, as a part of hypertension management. The importance of attaining a healthy weight is also discussed.     06/21/2023    1:05 PM 01/28/2023   12:30 AM 01/28/2023   12:00 AM 01/27/2023   11:00 PM 01/27/2023    6:07 PM 01/27/2023    6:05 PM 01/19/2023    4:00 AM  BP/Weight  Systolic BP 140 136 150 138  157 117  Diastolic BP 84 98 69 69  72 87  Wt. (Lbs)     240.99    BMI     38.9 kg/m2

## 2023-06-28 NOTE — Assessment & Plan Note (Signed)
 Will refer to [facility] to see if daily assistance can be obtained

## 2023-06-28 NOTE — Assessment & Plan Note (Signed)
 Diabetes associated with hypertension, hyperlipidemia, obesity, CKD, arthritis, and depression Decrease bedtime insulin  to 50 units  [name] is reminded of the importance of commitment to daily physical activity for 30 minutes or more, as able and the need to limit carbohydrate intake to 30 to 60 grams per meal to help with blood sugar control.   The need to take medication as prescribed, test blood sugar as directed, and to call between visits if there is a concern that blood sugar is uncontrolled is also discussed.   [name] is reminded of the importance of daily foot exam, annual eye examination, and good blood sugar, blood pressure and cholesterol control.     Latest Ref Rng & Units 04/25/2023    2:02 PM 01/27/2023    6:21 PM 01/14/2023   10:57 PM 12/01/2022    3:08 PM 07/29/2022    6:52 PM  Diabetic Labs  HbA1c 4.8 - 5.6 % 6.7    7.1    Chol 100 - 199 mg/dL 161       HDL >09 mg/dL 45       Calc LDL 0 - 99 mg/dL 48       Triglycerides 0 - 149 mg/dL 604       Creatinine 5.40 - 1.00 mg/dL 9.81  1.91  4.78  2.95  1.22       06/21/2023    1:05 PM 01/28/2023   12:30 AM 01/28/2023   12:00 AM 01/27/2023   11:00 PM 01/27/2023    6:07 PM 01/27/2023    6:05 PM 01/19/2023    4:00 AM  BP/Weight  Systolic BP 140 136 150 138  157 117  Diastolic BP 84 98 69 69  72 87  Wt. (Lbs)     240.99    BMI     38.9 kg/m2        Latest Ref Rng & Units 08/19/2020   12:00 AM 11/28/2019    1:40 PM  Foot/eye exam completion dates  Eye Exam No Retinopathy No Retinopathy       Foot Form Completion   Done     This result is from an external source.

## 2023-06-28 NOTE — Assessment & Plan Note (Signed)
 stable

## 2023-06-28 NOTE — Assessment & Plan Note (Signed)
 Hyperlipidemia:Low fat diet discussed and encouraged.   Lipid Panel  Lab Results  Component Value Date   CHOL 127 04/25/2023   HDL 45 04/25/2023   LDLCALC 48 04/25/2023   TRIG 213 (H) 04/25/2023   CHOLHDL 2.8 04/25/2023     Needs to reduce fat intake,nno med change

## 2023-06-28 NOTE — Assessment & Plan Note (Signed)
 Urine c/s positive for infection treat with cipro 

## 2023-06-28 NOTE — Assessment & Plan Note (Signed)
 Commit to twice daily colace and senokot and increase vegetables and water  intake , inc movement/ exercise as able

## 2023-06-28 NOTE — Assessment & Plan Note (Signed)
 Recurrent UTI refer urology

## 2023-06-28 NOTE — Progress Notes (Signed)
 [name]     MRN: [id number]      DOB: 12/18/1940  Chief Complaint  Patient presents with   Urinary Tract Infection    Believes she has a current uti. Complains of burning in urination and vaginal itching    Medical Management of Chronic Issues    Follow up     HPI [name] is here for follow up and re-evaluation of chronic medical conditions, medication management and review of any available recent lab and radiology data.  Complaint/ concern is as above, denies fever , chills flank pain, has recurrent UTI Denies polyuria, polydipsia, blurred vision , or hypoglycemic episodes. C/o burning pain in ;legsc/o constipation and hard stool Needs assistance with all ADL's , son who has  moved in with her is responsible for this with no help will reach out to see if assistance can be obtained    ROS Denies recent fever or chills. Denies sinus pressure, nasal congestion, ear pain or sore throat. Denies chest congestion, productive cough or wheezing. Denies chest pains, palpitations and leg swelling Denies abdominal pain, nausea, vomiting,.   Chronic generalized  joint pain, swelling and limitation in mobility. Denies headaches, seizures, numbness, or tingling. Denies uncontrolled  depression, anxiety or insomnia. Denies skin break down or rash.   PE  BP (!) 140/84   Pulse 77   Resp 16   Ht 5\' 6"  (1.676 m)   SpO2 91%   BMI 38.90 kg/m   Patient alert and oriented and in no cardiopulmonary distress.  HEENT: No facial asymmetry, EOMI,     Neck decreased ROM.  Chest: Clear to auscultation bilaterally.  CVS: S1, S2 no murmurs, no S3.Regular rate.  ABD: Soft non tender.   Ext: No edema  MS: marked;ly decreased  ROM spine, shoulders, hips and knees.  Skin: Intact, no ulcerations or rash noted.  Psych: Good eye contact, normal affect.  not anxious or depressed appearing.  CNS: CN 2-12 intact, power,  normal throughout.no focal deficits noted.   Assessment &  Plan  Type 2 diabetes mellitus with neurological complications (HCC) Diabetes associated with hypertension, hyperlipidemia, obesity, CKD, arthritis, and depression Decrease bedtime insulin  to 50 units  [name] is reminded of the importance of commitment to daily physical activity for 30 minutes or more, as able and the need to limit carbohydrate intake to 30 to 60 grams per meal to help with blood sugar control.   The need to take medication as prescribed, test blood sugar as directed, and to call between visits if there is a concern that blood sugar is uncontrolled is also discussed.   [name] is reminded of the importance of daily foot exam, annual eye examination, and good blood sugar, blood pressure and cholesterol control.     Latest Ref Rng & Units 04/25/2023    2:02 PM 01/27/2023    6:21 PM 01/14/2023   10:57 PM 12/01/2022    3:08 PM 07/29/2022    6:52 PM  Diabetic Labs  HbA1c 4.8 - 5.6 % 6.7    7.1    Chol 100 - 199 mg/dL 403       HDL >47 mg/dL 45       Calc LDL 0 - 99 mg/dL 48       Triglycerides 0 - 149 mg/dL 425       Creatinine 9.56 - 1.00 mg/dL 3.87  5.64  3.32  9.51  1.22       06/21/2023  1:05 PM 01/28/2023   12:30 AM 01/28/2023   12:00 AM 01/27/2023   11:00 PM 01/27/2023    6:07 PM 01/27/2023    6:05 PM 01/19/2023    4:00 AM  BP/Weight  Systolic BP 140 136 150 138  157 117  Diastolic BP 84 98 69 69  72 87  Wt. (Lbs)     240.99    BMI     38.9 kg/m2        Latest Ref Rng & Units 08/19/2020   12:00 AM 11/28/2019    1:40 PM  Foot/eye exam completion dates  Eye Exam No Retinopathy No Retinopathy       Foot Form Completion   Done     This result is from an external source.        Need for home health care Will refer to [facility] to see if daily assistance can be obtained  Essential hypertension Sub optimal but adequately Controlled, no change in medication DASH diet and commitment to daily physical activity for a minimum of 30 minutes discussed and  encouraged, as a part of hypertension management. The importance of attaining a healthy weight is also discussed.     06/21/2023    1:05 PM 01/28/2023   12:30 AM 01/28/2023   12:00 AM 01/27/2023   11:00 PM 01/27/2023    6:07 PM 01/27/2023    6:05 PM 01/19/2023    4:00 AM  BP/Weight  Systolic BP 140 136 150 138  157 117  Diastolic BP 84 98 69 69  72 87  Wt. (Lbs)     240.99    BMI     38.9 kg/m2         Depression with anxiety Controlled, no change in medication   Encounter for chronic pain management The patient's Controlled Substance registry is reviewed and compliance confirmed. Adequacy of  Pain control and level of function is assessed. Medication dosing is adjusted as deemed appropriate. Twelve weeks of medication is prescribed , with a follow up appointment between 11 to 12 weeks .   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   Lipid Panel  Lab Results  Component Value Date   CHOL 127 04/25/2023   HDL 45 04/25/2023   LDLCALC 48 04/25/2023   TRIG 213 (H) 04/25/2023   CHOLHDL 2.8 04/25/2023     Needs to reduce fat intake,nno med change  Recurrent UTI (urinary tract infection) Recurrent UTI refer urology

## 2023-07-01 ENCOUNTER — Telehealth: Payer: Self-pay

## 2023-07-01 NOTE — Progress Notes (Signed)
 Complex Care Management Note  Care Guide Note 07/01/2023 Name: KHAILEE BRY MRN: [id number] DOB: 1940/06/17  Alise Iron is a 83 y.o. year old female who sees Towanda Fret, MD for primary care. I reached out to Alise Iron by phone today to offer complex care management services.  Ms. Denhart was given information about Complex Care Management services today including:   The Complex Care Management services include support from the care team which includes your Nurse Care Manager, Clinical Social Worker, or Pharmacist.  The Complex Care Management team is here to help remove barriers to the health concerns and goals most important to you. Complex Care Management services are voluntary, and the patient may decline or stop services at any time by request to their care team member.   Complex Care Management Consent Status: Patient agreed to services and verbal consent obtained.   Follow up plan:  Telephone appointment with complex care management team member scheduled for:  07/04/2023  Encounter Outcome:  Patient Scheduled  Lenton Rail , RMA     [place]  [facility], [facility] Guide  Direct Dial: [phone]  Website: [email or web address]

## 2023-07-01 NOTE — Progress Notes (Signed)
 Complex Care Management Note Care Guide Note  07/01/2023 Name: [name] MRN: [id number] DOB: 03-Jan-1941   Complex Care Management Outreach Attempts: An unsuccessful telephone outreach was attempted today to offer the patient information about available complex care management services.  Follow Up Plan:  Additional outreach attempts will be made to offer the patient complex care management information and services.   Encounter Outcome:  No Answer  Lenton Rail , RMA     [place]  [facility], [facility] Guide  Direct Dial: [phone]  Website: [email or web address]

## 2023-07-04 ENCOUNTER — Other Ambulatory Visit: Payer: Self-pay

## 2023-07-04 NOTE — Patient Instructions (Signed)
 Visit Information  Thank you for taking time to visit with me today. Please don't hesitate to contact me if I can be of assistance to you before our next scheduled appointment.  Our next appointment is by telephone on 07/18/23 at 9am Please call the care guide team at [phone] if you need to cancel or reschedule your appointment.   Following is a copy of your care plan:   Goals Addressed             This Visit's Progress    VBCI Social Work Care Plan-BSW       Problems:   Son need Respite Services to care for patient.  CSW Clinical Goal(s):   Over the next 2 weeks the Caregiver will will follow up with Medicaid and [facility] as directed by Social Work.  Interventions:  Social Determinants of Health in Patient with CKD Stage 3, DMII, and HTN: SDOH assessments completed: Caregiver Respite Evaluation of current treatment plan related to unmet needs Referral to Department of Social Services to review Medicaid options and apply with [facility] for respite.  Patient Goals/Self-Care Activities:  Call Department of Social Services [phone] to apply for Medicaid. Call [facility] to follow up on caregiver resources and support.  Plan:   Telephone follow up appointment with care management team member scheduled for:  07/18/23 at 9am.        Please call 911 if you are experiencing a Mental Health or Behavioral Health Crisis or need someone to talk to.  Patient verbalizes understanding of instructions and care plan provided today and agrees to view in MyChart. Active MyChart status and patient understanding of how to access instructions and care plan via MyChart confirmed with patient.     Dallis Dues, BSW [place]  [facility], [facility] Social Worker Direct Dial: [phone]  Fax: [phone] Website: [email or web address]

## 2023-07-04 NOTE — Patient Outreach (Signed)
 Complex Care Management   Visit Note  07/04/2023  Name:  [name] MRN: [id number] DOB: 08/06/1940  Situation: Referral received for Complex Care Management related to SDOH Barriers:  Caregiver Respite I obtained verbal consent from Caregiver.  Visit completed with [name] son  on the phone  Background:   Past Medical History:  Diagnosis Date   Anemia, iron deficiency 2012   Evaluated by Dr. Nolene Baumgarten; H&H of 9.3/30.8 with MCV-79 in 10/2010; 3/3 positive Hemoccult cards in 07/2011   Anxiety    was on Prozac  for a couple of weeks   Atrial fibrillation (HCC)    takes Coumadin  daily   Bruises easily    takes Coumadin    Chronic anticoagulation 07/21/2010   Chronic back pain    related to knee pain   Degenerative joint disease    Knees   Dementia (HCC)    Depression    Diabetes mellitus    takes Metformin  daily   Gastroesophageal reflux disease    takes Omeprazole  daily   Glaucoma    Glaucoma    uses eye drops at night   History of blood transfusion 1969   History of gout    was on medication but taken off of several months ago   Hx of colonic polyps    Hx of migraines    last one about a yr ago;takes Topamax  daily   Hyperlipidemia    takes Pravastatin  daily   Hypertension    takes Hyzaar daily and Propranolol     Insomnia    takes Restoril  prn   Joint pain    Joint swelling    Memory loss    takes donepezil    Migraine    Migraine    Nocturia    Overweight(278.02)    Pneumonia 1969   hx of   Pulmonary embolism [facility]) May 2012   acute presentation, bilateral PE   Skin spots-aging    Urinary frequency     Assessment:  Patient son reports he moved in with his patient and purchased the house she was living in.  The son has a wife/home in [place], but it is not handicap accessible.  Son provides all care and other family is limited on support, but visit from time to time.  Patient does not have full Medicaid and would be self pay for personal care  services in the home.  Son reports she can not afford private pay.  Patient [employer] plan provides $50 OTC benefit.  SW suggest requesting a review of Medicaid as currently, patient receives [employer], which does not cover patients medical needs.  SW educated on [place] with spend down options.  SW also refers caregiver to [facility] to apply for caregiver respite services.  SW email link to son to apply.  SW will finish assessment and next visit.  Recommendation:   No provider recommendations at this time.  Follow Up Plan:   Telephone follow up appointment date/time:  07/18/23 at 9am  Dallis Dues, BSW [place]  [facility], [facility] Social Worker Direct Dial: [phone]  Fax: [phone] Website: [email or web address]

## 2023-07-18 ENCOUNTER — Other Ambulatory Visit: Payer: Self-pay

## 2023-07-18 NOTE — Patient Outreach (Signed)
 Complex Care Management   Visit Note  07/18/2023  Name:  [name] MRN: [id number] DOB: [birth year]  Situation: Referral received for Complex Care Management related to Respite Care I obtained verbal consent from Caregiver.  Visit completed with Caregiver/son Gracie Lav  on the phone  Background:   Past Medical History:  Diagnosis Date   Anemia, iron deficiency 2012   Evaluated by Dr. Nolene Baumgarten; H&H of 9.3/30.8 with MCV-79 in 10/2010; 3/3 positive Hemoccult cards in 07/2011   Anxiety    was on Prozac  for a couple of weeks   Atrial fibrillation (HCC)    takes Coumadin  daily   Bruises easily    takes Coumadin    Chronic anticoagulation 07/21/2010   Chronic back pain    related to knee pain   Degenerative joint disease    Knees   Dementia (HCC)    Depression    Diabetes mellitus    takes Metformin  daily   Gastroesophageal reflux disease    takes Omeprazole  daily   Glaucoma    Glaucoma    uses eye drops at night   History of blood transfusion 1969   History of gout    was on medication but taken off of several months ago   Hx of colonic polyps    Hx of migraines    last one about a yr ago;takes Topamax  daily   Hyperlipidemia    takes Pravastatin  daily   Hypertension    takes Hyzaar daily and Propranolol     Insomnia    takes Restoril  prn   Joint pain    Joint swelling    Memory loss    takes donepezil    Migraine    Migraine    Nocturia    Overweight(278.02)    Pneumonia 1969   hx of   Pulmonary embolism [facility]) May 2012   acute presentation, bilateral PE   Skin spots-aging    Urinary frequency     Assessment:  Patients son Mr. Traci Fridge reports he was not able to contact [employer] and there is some confusion about the link SW provided.  SW resent the link with the page for [facility] direct page.  Mr. Traci Fridge will also contact Medicaid for a review, but has been busy.    SW will follow up in 2 weeks on the progress.  SDOH Interventions    Flowsheet Row Patient  Outreach Telephone from 07/04/2023 in [place] [facility] Clinical Support from 09/14/2022 in [facility] [place] Primary [facility] Office Visit from 04/27/2022 in [facility] Primary Care Telephone from 04/26/2022 in [facility] Community Care Coordination Care Coordination from 11/10/2021 in [facility] Community Care Coordination Clinical Support from 09/11/2021 in [facility] [place] Primary [facility]  SDOH Interventions        Food Insecurity Interventions Intervention Not Indicated Intervention Not Indicated -- Intervention Not Indicated Intervention Not Indicated Intervention Not Indicated  Housing Interventions Intervention Not Indicated Intervention Not Indicated -- Intervention Not Indicated -- Intervention Not Indicated  Transportation Interventions Patient Resources (Friends/Family), Other (Comment)  [employer]] Patient Resources Dietitian) -- -- -- Other (Comment)  Utilities Interventions Intervention Not Indicated Intervention Not Indicated -- -- -- --  Alcohol Usage Interventions -- Intervention Not Indicated (Score <7) -- -- -- --  Depression Interventions/Treatment  -- -- Counseling -- -- --  Financial Strain Interventions Intervention Not Indicated Intervention Not Indicated -- -- -- Intervention Not Indicated  Physical Activity Interventions -- Patient Declined -- -- -- Patient  Refused  Stress Interventions -- Intervention Not Indicated -- -- -- Intervention Not Indicated  Social Connections Interventions -- Patient Declined -- -- -- Patient Refused  Health Literacy Interventions -- Intervention Not Indicated -- -- -- --         Recommendation:   None  Follow Up Plan:   Telephone follow up appointment date/time:  08/01/23 at 10am.  Dallis Dues, BSW [place]  [facility], [facility] Social Worker Direct Dial: [phone]  Fax: [phone] Website:  [email or web address]

## 2023-07-18 NOTE — Patient Instructions (Signed)
 Visit Information  Thank you for taking time to visit with me today. Please don't hesitate to contact me if I can be of assistance to you before our next scheduled appointment.  Your next care management appointment is by telephone on 08/01/23 at 10am   Please call the care guide team at [phone] if you need to cancel, schedule, or reschedule an appointment.   Please call 911 if you are experiencing a Mental Health or Behavioral Health Crisis or need someone to talk to.  Dallis Dues, BSW [place]  [facility], [facility] Social Worker Direct Dial: [phone]  Fax: [phone] Website: [email or web address]

## 2023-07-21 ENCOUNTER — Other Ambulatory Visit: Payer: Self-pay | Admitting: Family Medicine

## 2023-07-27 DIAGNOSIS — H401111 Primary open-angle glaucoma, right eye, mild stage: Secondary | ICD-10-CM | POA: Diagnosis not present

## 2023-08-01 ENCOUNTER — Other Ambulatory Visit: Payer: Self-pay

## 2023-08-01 NOTE — Patient Outreach (Signed)
 Complex Care Management   Visit Note  08/01/2023  Name:  [name] MRN: [id number] DOB: 1940-09-29  Situation: Referral received for Complex Care Management related to Respite and Medicaid review I obtained verbal consent from Caregiver.  Visit completed with son/caregiver  on the phone  Background:   Past Medical History:  Diagnosis Date   Anemia, iron deficiency 2012   Evaluated by Dr. Nolene Baumgarten; H&H of 9.3/30.8 with MCV-79 in 10/2010; 3/3 positive Hemoccult cards in 07/2011   Anxiety    was on Prozac  for a couple of weeks   Atrial fibrillation (HCC)    takes Coumadin  daily   Bruises easily    takes Coumadin    Chronic anticoagulation 07/21/2010   Chronic back pain    related to knee pain   Degenerative joint disease    Knees   Dementia (HCC)    Depression    Diabetes mellitus    takes Metformin  daily   Gastroesophageal reflux disease    takes Omeprazole  daily   Glaucoma    Glaucoma    uses eye drops at night   History of blood transfusion 1969   History of gout    was on medication but taken off of several months ago   Hx of colonic polyps    Hx of migraines    last one about a yr ago;takes Topamax  daily   Hyperlipidemia    takes Pravastatin  daily   Hypertension    takes Hyzaar daily and Propranolol     Insomnia    takes Restoril  prn   Joint pain    Joint swelling    Memory loss    takes donepezil    Migraine    Migraine    Nocturia    Overweight(278.02)    Pneumonia 1969   hx of   Pulmonary embolism [facility]) May 2012   acute presentation, bilateral PE   Skin spots-aging    Urinary frequency     Assessment:  The Caregiver will follow up with Medicaid and [facility] [place] [facility] as directed by Social Work.  Caregiver is not sure if or when he will follow up due to frustration with previous denial for programs and limited time.  Caregiver does not request a follow up.   Recommendation:   none  Follow Up Plan:   Patient has met all care management goals. Care  Management case will be closed. Patient has been provided contact information should new needs arise.   Dallis Dues, BSW [place]  [facility], [facility] Social Worker Direct Dial: [phone]  Fax: [phone] Website: [email or web address]

## 2023-08-01 NOTE — Patient Instructions (Signed)

## 2023-08-03 ENCOUNTER — Other Ambulatory Visit: Payer: Self-pay | Admitting: Family Medicine

## 2023-08-08 NOTE — Progress Notes (Incomplete)
 Chief Complaint:   History of Present Illness:  [name] is a 83 y.o. female who is seen in consultation from Antonetta Rollene BRAVO, MD for evaluation of recurrent UTI.  She has been seen in [place] at [facility] urology in the past.  She does have a history of recurring infections as well as history of bilateral ureteroscopy by Dr. Nieves in February 2020.  At that time she had a left ureteral and a right renal stone.  They were treated with holmium laser and stenting.  Prior to that, she had recurring multiply resistant E. coli UTIs, last seen in April, 2020.  At that time she grew a fairly sensitive E. coli.  Recent urine cultures: June, 2024--Aerococcus October, 2024--mixed urogenital flora December, 2024-insignificant growth March, 2025--Aerococcus May, 2025--pansensitive Proteus   Past Medical History:  Past Medical History:  Diagnosis Date   Anemia, iron deficiency 2012   Evaluated by Dr. Harvey; H&H of 9.3/30.8 with MCV-79 in 10/2010; 3/3 positive Hemoccult cards in 07/2011   Anxiety    was on Prozac  for a couple of weeks   Atrial fibrillation (HCC)    takes Coumadin  daily   Bruises easily    takes Coumadin    Chronic anticoagulation 07/21/2010   Chronic back pain    related to knee pain   Degenerative joint disease    Knees   Dementia (HCC)    Depression    Diabetes mellitus    takes Metformin  daily   Gastroesophageal reflux disease    takes Omeprazole  daily   Glaucoma    Glaucoma    uses eye drops at night   History of blood transfusion 1969   History of gout    was on medication but taken off of several months ago   Hx of colonic polyps    Hx of migraines    last one about a yr ago;takes Topamax  daily   Hyperlipidemia    takes Pravastatin  daily   Hypertension    takes Hyzaar daily and Propranolol     Insomnia    takes Restoril  prn   Joint pain    Joint swelling    Memory loss    takes donepezil    Migraine    Migraine    Nocturia     Overweight(278.02)    Pneumonia 1969   hx of   Pulmonary embolism [facility]) May 2012   acute presentation, bilateral PE   Skin spots-aging    Urinary frequency     Past Surgical History:  Past Surgical History:  Procedure Laterality Date   BIOPSY  04/22/2019   Procedure: BIOPSY;  Surgeon: Shaaron Lamar HERO, MD;  [facility];  Service: Endoscopy;;  duodenum   BIOPSY  04/22/2022   Procedure: BIOPSY;  Surgeon: Eartha Angelia Sieving, MD;  [facility];  Service: Gastroenterology;;   CATARACT EXTRACTION W/PHACO Right 04/11/2012   Procedure: CATARACT EXTRACTION PHACO AND INTRAOCULAR LENS PLACEMENT (IOC);  Surgeon: Oneil T. Roz, MD;  Location: [facility];  Service: Ophthalmology;  Laterality: Right;  CDE=9.61   CATARACT EXTRACTION W/PHACO Left 12/26/2012   Procedure: CATARACT EXTRACTION PHACO AND INTRAOCULAR LENS PLACEMENT (IOC);  Surgeon: Oneil T. Roz, MD;  Location: [facility];  Service: Ophthalmology;  Laterality: Left;  CDE:7.74   COLONOSCOPY  Jan 2002; 2012   2002: Dr. Claudene, ext. hemorrhoids, nl colon; 2012-adenomatous polyps, gastritis on EGD   COLONOSCOPY N/A 04/24/2019   Procedure: COLONOSCOPY;  Surgeon: Golda Claudis PENNER, MD;  [facility];  Service: Endoscopy;  Laterality: N/A;   CYSTOSCOPY W/ URETERAL STENT PLACEMENT Bilateral 02/25/2018   Procedure: CYSTOSCOPY WITH BILATERAL RETROGRADE PYELOGRAM/BILATERAL URETERAL STENT PLACEMENT, BLADDER BIOPSIES WITH FULGERATION;  Surgeon: Nieves Cough, MD;  [facility]: [facility];  Service: Urology;  Laterality: Bilateral;   CYSTOSCOPY/URETEROSCOPY/HOLMIUM LASER/STENT PLACEMENT Bilateral 03/21/2018   Procedure: CYSTOSCOPY/URETEROSCOPY/HOLMIUM LASER/STENT PLACEMENT;  Surgeon: Nieves Cough, MD;  Location[facility];  Service: Urology;  Laterality: Bilateral;  ONLY NEEDS 60 MIN   DILATION AND CURETTAGE OF UTERUS     ESOPHAGOGASTRODUODENOSCOPY  08/24/2011   ESOPHAGOGASTRODUODENOSCOPY; esophageal dilatation;  Claudis RAYMOND Rivet, MD;   ESOPHAGOGASTRODUODENOSCOPY N/A 04/22/2019   Procedure: ESOPHAGOGASTRODUODENOSCOPY (EGD);  Surgeon: Shaaron Lamar HERO, MD;  [facility];  Service: Endoscopy;  Laterality: N/A;   ESOPHAGOGASTRODUODENOSCOPY (EGD) WITH PROPOFOL  N/A 04/22/2022   Procedure: ESOPHAGOGASTRODUODENOSCOPY (EGD) WITH PROPOFOL ;  Surgeon: Eartha Angelia Sieving, MD;  [facility];  Service: Gastroenterology;  Laterality: N/A;  with possible esophageal dilation   GIVENS CAPSULE STUDY  08/18/2011   Procedure: GIVENS CAPSULE STUDY;  Surgeon: Claudis RAYMOND Rivet, MD;  [facility];  Service: Endoscopy;  Laterality: N/A;  730   HOT HEMOSTASIS  04/22/2022   Procedure: HOT HEMOSTASIS (ARGON PLASMA COAGULATION/BICAP);  Surgeon: Eartha Angelia, Sieving, MD;  [facility];  Service: Gastroenterology;;   KNEE ARTHROSCOPY W/ MENISCECTOMY  90's   Left   MALONEY DILATION  04/22/2019   Procedure: MALONEY DILATION;  Surgeon: Shaaron Lamar HERO, MD;  [facility];  Service: Endoscopy;;   ORIF ANKLE FRACTURE Right 90's   TONSILLECTOMY     TOTAL KNEE ARTHROPLASTY  10/20/2011   Procedure: TOTAL KNEE ARTHROPLASTY;  Surgeon: Sieving JULIANNA Chancy, MD;  Location: [facility];  Service: Orthopedics;  Laterality: Left;   UPPER GASTROINTESTINAL ENDOSCOPY     URETHRAL DILATION      Allergies:  Allergies  Allergen Reactions   Penicillins Itching   Fentanyl  Itching   Sulfonamide Derivatives Hives   Sulfur    Codeine Itching and Rash    tussionex  Is tolerated by patient, no phenergan  dm     Family History:  Family History  Problem Relation Age of Onset   Diabetes Mother    Hypertension Mother    Arthritis Mother    Heart failure Mother    Migraines Mother    Kidney failure Mother    Leukemia Father    Migraines Father    Kidney failure Father    Diabetes Sister    Hypertension Sister    Diabetes Brother    Hypertension Brother    Hypertension Brother    Hypertension Brother     Hypertension Brother    Diabetes Brother    Diabetes Brother    Diabetes Brother    Pulmonary embolism Sister    Migraines Sister    Kidney failure Brother    Colon cancer Neg Hx    Colon polyps Neg Hx     Social History:  Social History   Tobacco Use   Smoking status: Never   Smokeless tobacco: Never  Vaping Use   Vaping status: Never Used  Substance Use Topics   Alcohol use: No   Drug use: No    Review of symptoms:  Constitutional:  Negative for unexplained weight loss, night sweats, fever, chills ENT:  Negative for nose bleeds, sinus pain, painful swallowing CV:  Negative for chest pain, shortness of breath, exercise intolerance, palpitations, loss of consciousness Resp:  Negative for cough, wheezing,  shortness of breath GI:  Negative for nausea, vomiting, diarrhea, bloody stools GU:  Positives noted in HPI; otherwise negative for gross hematuria, dysuria, urinary incontinence Neuro:  Negative for seizures, poor balance, limb weakness, slurred speech Psych:  Negative for lack of energy, depression, anxiety Endocrine:  Negative for polydipsia, polyuria, symptoms of hypoglycemia (dizziness, hunger, sweating) Hematologic:  Negative for anemia, purpura, petechia, prolonged or excessive bleeding, use of anticoagulants  Allergic:  Negative for difficulty breathing or choking as a result of exposure to anything; no shellfish allergy; no allergic response (rash/itch) to materials, foods  Physical exam: There were no vitals taken for this visit. GENERAL APPEARANCE:  Well appearing, well developed, well nourished, NAD HEENT: Atraumatic, Normocephalic, oropharynx clear. NECK: Supple without lymphadenopathy or thyromegaly. LUNGS: Clear to auscultation bilaterally. HEART: Regular Rate and Rhythm without murmurs, gallops, or rubs. ABDOMEN: Soft, non-tender, No Masses. EXTREMITIES: Moves all extremities well.  Without clubbing, cyanosis, or edema. NEUROLOGIC:  Alert and oriented  x 3, normal gait, CN II-XII grossly intact.  MENTAL STATUS:  Appropriate. BACK:  Non-tender to palpation.  No CVAT SKIN:  Warm, dry and intact.    Results: No results found. However, due to the size of the patient record, not all encounters were searched. Please check Results Review for a complete set of results.  I have reviewed prior patient's records  I have reviewed referring/prior physicians records  I have reviewed urinalysis  I have reviewed prior urine cultures  I reviewed prior imaging studies CT abdomen pelvis with contrast as of December Gemtesa would like for me review the Prosta we have had problems kidney stones or hydronephrosis  Assessment: No diagnosis found.   Plan: ***

## 2023-08-09 ENCOUNTER — Ambulatory Visit: Admitting: Urology

## 2023-08-09 DIAGNOSIS — Z87442 Personal history of urinary calculi: Secondary | ICD-10-CM

## 2023-08-09 DIAGNOSIS — N390 Urinary tract infection, site not specified: Secondary | ICD-10-CM

## 2023-08-15 NOTE — Progress Notes (Signed)
 Chief Complaint:   History of Present Illness:  [name] is a 83 y.o. female who is seen in consultation from Antonetta Rollene BRAVO, MD for evaluation of recurrent UTI.  She has been seen in [place] at [facility] urology in the past.  She does have a history of recurring infections as well as history of bilateral ureteroscopy by Dr. Nieves in February 2020.  At that time she had a left ureteral and a right renal stone.  They were treated with holmium laser and stenting.  Prior to that, she had recurring multiply resistant E. coli UTIs, last seen in April, 2020.  At that time she grew a fairly sensitive E. coli.  Recent urine cultures: June, 2024--Aerococcus October, 2024--mixed urogenital flora December, 2024-insignificant growth March, 2025--Aerococcus May, 2025--pansensitive Proteus  Apparently, when she has some dysuria and urinary symptoms she brings a specimen in to the office.  Antibiotics prescribed, symptoms are apparently improved.  No associated gross hematuria, usually does not have cloudy urine, does not have associated abdominal pain or fever.   Past Medical History:  Past Medical History:  Diagnosis Date   Anemia, iron deficiency 2012   Evaluated by Dr. Harvey; H&H of 9.3/30.8 with MCV-79 in 10/2010; 3/3 positive Hemoccult cards in 07/2011   Anxiety    was on Prozac  for a couple of weeks   Atrial fibrillation (HCC)    takes Coumadin  daily   Bruises easily    takes Coumadin    Chronic anticoagulation 07/21/2010   Chronic back pain    related to knee pain   Degenerative joint disease    Knees   Dementia (HCC)    Depression    Diabetes mellitus    takes Metformin  daily   Gastroesophageal reflux disease    takes Omeprazole  daily   Glaucoma    Glaucoma    uses eye drops at night   History of blood transfusion 1969   History of gout    was on medication but taken off of several months ago   Hx of colonic polyps    Hx of migraines    last one about a yr  ago;takes Topamax  daily   Hyperlipidemia    takes Pravastatin  daily   Hypertension    takes Hyzaar daily and Propranolol     Insomnia    takes Restoril  prn   Joint pain    Joint swelling    Memory loss    takes donepezil    Migraine    Migraine    Nocturia    Overweight(278.02)    Pneumonia 1969   hx of   Pulmonary embolism [facility]) May 2012   acute presentation, bilateral PE   Skin spots-aging    Urinary frequency     Past Surgical History:  Past Surgical History:  Procedure Laterality Date   BIOPSY  04/22/2019   Procedure: BIOPSY;  Surgeon: Shaaron Lamar HERO, MD;  [facility];  Service: Endoscopy;;  duodenum   BIOPSY  04/22/2022   Procedure: BIOPSY;  Surgeon: Eartha Angelia Sieving, MD;  [facility];  Service: Gastroenterology;;   CATARACT EXTRACTION W/PHACO Right 04/11/2012   Procedure: CATARACT EXTRACTION PHACO AND INTRAOCULAR LENS PLACEMENT (IOC);  Surgeon: Oneil T. Roz, MD;  [facility]: [facility];  Service: Ophthalmology;  Laterality: Right;  CDE=9.61   CATARACT EXTRACTION W/PHACO Left 12/26/2012   Procedure: CATARACT EXTRACTION PHACO AND INTRAOCULAR LENS PLACEMENT (IOC);  Surgeon: Oneil T. Roz, MD;  [facility]: [facility];  Service: Ophthalmology;  Laterality: Left;  CDE:7.74   COLONOSCOPY  Jan 2002; 2012   2002: Dr. Claudene, ext. hemorrhoids, nl colon; 2012-adenomatous polyps, gastritis on EGD   COLONOSCOPY N/A 04/24/2019   Procedure: COLONOSCOPY;  Surgeon: Golda Claudis PENNER, MD;  [facility];  Service: Endoscopy;  Laterality: N/A;   CYSTOSCOPY W/ URETERAL STENT PLACEMENT Bilateral 02/25/2018   Procedure: CYSTOSCOPY WITH BILATERAL RETROGRADE PYELOGRAM/BILATERAL URETERAL STENT PLACEMENT, BLADDER BIOPSIES WITH FULGERATION;  Surgeon: Nieves Cough, MD;  [facility]: [facility];  Service: Urology;  Laterality: Bilateral;   CYSTOSCOPY/URETEROSCOPY/HOLMIUM LASER/STENT PLACEMENT Bilateral 03/21/2018   Procedure: CYSTOSCOPY/URETEROSCOPY/HOLMIUM LASER/STENT  PLACEMENT;  Surgeon: Nieves Cough, MD;  Location: [facility];  Service: Urology;  Laterality: Bilateral;  ONLY NEEDS 60 MIN   DILATION AND CURETTAGE OF UTERUS     ESOPHAGOGASTRODUODENOSCOPY  08/24/2011   ESOPHAGOGASTRODUODENOSCOPY; esophageal dilatation; Claudis PENNER Golda, MD;   ESOPHAGOGASTRODUODENOSCOPY N/A 04/22/2019   Procedure: ESOPHAGOGASTRODUODENOSCOPY (EGD);  Surgeon: Shaaron Lamar HERO, MD;  [facility];  Service: Endoscopy;  Laterality: N/A;   ESOPHAGOGASTRODUODENOSCOPY (EGD) WITH PROPOFOL  N/A 04/22/2022   Procedure: ESOPHAGOGASTRODUODENOSCOPY (EGD) WITH PROPOFOL ;  Surgeon: Eartha Angelia Sieving, MD;  [facility];  Service: Gastroenterology;  Laterality: N/A;  with possible esophageal dilation   GIVENS CAPSULE STUDY  08/18/2011   Procedure: GIVENS CAPSULE STUDY;  Surgeon: Claudis PENNER Golda, MD;  [facility];  Service: Endoscopy;  Laterality: N/A;  730   HOT HEMOSTASIS  04/22/2022   Procedure: HOT HEMOSTASIS (ARGON PLASMA COAGULATION/BICAP);  Surgeon: Eartha Angelia, Sieving, MD;  [facility];  Service: Gastroenterology;;   KNEE ARTHROSCOPY W/ MENISCECTOMY  90's   Left   MALONEY DILATION  04/22/2019   Procedure: MALONEY DILATION;  Surgeon: Shaaron Lamar HERO, MD;  [facility];  Service: Endoscopy;;   ORIF ANKLE FRACTURE Right 90's   TONSILLECTOMY     TOTAL KNEE ARTHROPLASTY  10/20/2011   Procedure: TOTAL KNEE ARTHROPLASTY;  Surgeon: Sieving JULIANNA Chancy, MD;  Location: [facility] OR;  Service: Orthopedics;  Laterality: Left;   UPPER GASTROINTESTINAL ENDOSCOPY     URETHRAL DILATION      Allergies:  Allergies  Allergen Reactions   Penicillins Itching   Fentanyl  Itching   Sulfonamide Derivatives Hives   Sulfur    Codeine Itching and Rash    tussionex  Is tolerated by patient, no phenergan  dm     Family History:  Family History  Problem Relation Age of Onset   Diabetes Mother    Hypertension Mother    Arthritis Mother     Heart failure Mother    Migraines Mother    Kidney failure Mother    Leukemia Father    Migraines Father    Kidney failure Father    Diabetes Sister    Hypertension Sister    Diabetes Brother    Hypertension Brother    Hypertension Brother    Hypertension Brother    Hypertension Brother    Diabetes Brother    Diabetes Brother    Diabetes Brother    Pulmonary embolism Sister    Migraines Sister    Kidney failure Brother    Colon cancer Neg Hx    Colon polyps Neg Hx     Social History:  Social History   Tobacco Use   Smoking status: Never   Smokeless tobacco: Never  Vaping Use   Vaping status: Never Used  Substance Use Topics   Alcohol use: No   Drug use: No    Review of symptoms:  Constitutional:  Negative for unexplained weight loss, night sweats, fever, chills ENT:  Negative for nose bleeds, sinus pain, painful swallowing CV:  Negative for chest pain, shortness of breath, exercise intolerance, palpitations, loss of consciousness Resp:  Negative for cough, wheezing, shortness of breath GI:  Negative for nausea, vomiting, diarrhea, bloody stools GU:  Positives noted in HPI; otherwise negative for gross hematuria, dysuria, urinary incontinence Neuro:  Negative for seizures, poor balance, limb weakness, slurred speech Psych:  Negative for lack of energy, depression, anxiety Endocrine:  Negative for polydipsia, polyuria, symptoms of hypoglycemia (dizziness, hunger, sweating) Hematologic:  Negative for anemia, purpura, petechia, prolonged or excessive bleeding, use of anticoagulants  Allergic:  Negative for difficulty breathing or choking as a result of exposure to anything; no shellfish allergy; no allergic response (rash/itch) to materials, foods  Physical exam: There were no vitals taken for this visit. GENERAL APPEARANCE: She is in a wheelchair.  Moderately obese.  Alert but not oriented HEENT: Atraumatic, Normocephalic, oropharynx clear. NECK: Supple without  lymphadenopathy or thyromegaly. LUNGS: Clear to auscultation bilaterally. HEART: Regular Rate and Rhythm without murmurs, gallops, or rubs NEUROLOGIC:  Alert and oriented x 3, normal gait, CN II-XII grossly intact.  MENTAL STATUS:  Appropriate. BACK:  Non-tender to palpation.  No CVAT SKIN:  Warm, dry and intact.    Results: No results found. However, due to the size of the patient record, not all encounters were searched. Please check Results Review for a complete set of results.  I have reviewed prior patient's records from Dr. Antonetta  I have reviewed urinalysis--clear today  I have reviewed prior urine cultures  I reviewed prior imaging studies CT abdomen pelvis with contrast as of December   Assessment: - Urinary symptoms, recurring.  She does have significant incontinence but has limited mobility  -Positive urine cultures at times.  I do not think she has significant underlying abnormalities other than vaginal atrophic changes   Plan: -I will start her on perivaginal estrogen cream.  This will be supervised by her son, Norleen  -Short course of methenamine  -I will have her come back in about 3 months to check symptoms

## 2023-08-16 ENCOUNTER — Encounter: Payer: Self-pay | Admitting: Urology

## 2023-08-16 ENCOUNTER — Ambulatory Visit (INDEPENDENT_AMBULATORY_CARE_PROVIDER_SITE_OTHER): Admitting: Urology

## 2023-08-16 VITALS — BP 131/77 | HR 71

## 2023-08-16 DIAGNOSIS — Z87440 Personal history of urinary (tract) infections: Secondary | ICD-10-CM

## 2023-08-16 DIAGNOSIS — N390 Urinary tract infection, site not specified: Secondary | ICD-10-CM | POA: Diagnosis not present

## 2023-08-16 DIAGNOSIS — N952 Postmenopausal atrophic vaginitis: Secondary | ICD-10-CM

## 2023-08-16 DIAGNOSIS — R399 Unspecified symptoms and signs involving the genitourinary system: Secondary | ICD-10-CM | POA: Diagnosis not present

## 2023-08-16 DIAGNOSIS — R32 Unspecified urinary incontinence: Secondary | ICD-10-CM | POA: Diagnosis not present

## 2023-08-16 LAB — URINALYSIS, ROUTINE W REFLEX MICROSCOPIC
Bilirubin, UA: NEGATIVE
Glucose, UA: NEGATIVE
Ketones, UA: NEGATIVE
Leukocytes,UA: NEGATIVE
Nitrite, UA: NEGATIVE
Protein,UA: NEGATIVE
RBC, UA: NEGATIVE
Specific Gravity, UA: 1.015 (ref 1.005–1.030)
Urobilinogen, Ur: 0.2 mg/dL (ref 0.2–1.0)
pH, UA: 7.5 (ref 5.0–7.5)

## 2023-08-16 LAB — BLADDER SCAN AMB NON-IMAGING: Scan Result: 93

## 2023-08-16 MED ORDER — METHENAMINE HIPPURATE 1 G PO TABS: ORAL_TABLET | ORAL | 2 refills | Status: DC

## 2023-08-16 MED ORDER — ESTRADIOL 0.1 MG/GM VA CREA
TOPICAL_CREAM | VAGINAL | 3 refills | Status: AC
Start: 2023-08-16 — End: ?

## 2023-08-16 NOTE — Progress Notes (Unsigned)
 Bladder Scan completed today.  Patient cannot void prior to the bladder scan. Bladder scan result: 93  Performed By: Exie DASEN. CMA  Additional notes-

## 2023-08-18 ENCOUNTER — Other Ambulatory Visit: Payer: Self-pay | Admitting: Family Medicine

## 2023-08-30 DIAGNOSIS — H401111 Primary open-angle glaucoma, right eye, mild stage: Secondary | ICD-10-CM | POA: Diagnosis not present

## 2023-09-13 ENCOUNTER — Other Ambulatory Visit: Payer: Self-pay | Admitting: Family Medicine

## 2023-09-27 ENCOUNTER — Other Ambulatory Visit: Payer: Self-pay | Admitting: Family Medicine

## 2023-10-12 ENCOUNTER — Other Ambulatory Visit: Payer: Self-pay | Admitting: Urology

## 2023-10-12 DIAGNOSIS — N390 Urinary tract infection, site not specified: Secondary | ICD-10-CM

## 2023-10-25 ENCOUNTER — Ambulatory Visit: Admitting: Family Medicine

## 2023-11-02 ENCOUNTER — Ambulatory Visit: Payer: Medicare HMO

## 2023-11-02 VITALS — Ht 66.0 in | Wt 240.0 lb

## 2023-11-02 DIAGNOSIS — E1149 Type 2 diabetes mellitus with other diabetic neurological complication: Secondary | ICD-10-CM

## 2023-11-02 DIAGNOSIS — Z0000 Encounter for general adult medical examination without abnormal findings: Secondary | ICD-10-CM | POA: Diagnosis not present

## 2023-11-02 NOTE — Progress Notes (Signed)
 Please attest and cosign this visit due to patients primary care provider not being immediately available at the time the visit was completed.   Subjective:   [name] is a 83 y.o. who presents for a Medicare Wellness preventive visit. As a reminder, Annual Wellness Visits don't include a physical exam, and some assessments may be limited, especially if this visit is performed virtually. We may recommend an in-person follow-up visit with your provider if needed.  Visit Complete: Virtual I connected with  [name] on 11/02/23 by a audio enabled telemedicine application and verified that I am speaking with the correct person using two identifiers.  Patient Location: Home  Provider Location: Home Office  I discussed the limitations of evaluation and management by telemedicine. The patient expressed understanding and agreed to proceed.  Vital Signs: Because this visit was a virtual/telehealth visit, some criteria may be missing or patient reported. Any vitals not documented were not able to be obtained and vitals that have been documented are patient reported. VideoDeclined- This patient declined Librarian, academic. Therefore the visit was completed with audio only.  Persons Participating in Visit: Patient assisted by [name], caregiver/POA.  AWV Questionnaire: No: Patient Medicare AWV questionnaire was not completed prior to this visit. Cardiac Risk Factors include: advanced age (>34men, >37 women);sedentary lifestyle;diabetes mellitus;dyslipidemia;hypertension;obesity (BMI >30kg/m2)     Objective:    Today's Vitals   11/02/23 1647  Weight: 240 lb (108.9 kg)  Height: 5' 6 (1.676 m)   Body mass index is 38.74 kg/m.    11/02/2023    4:53 PM 01/27/2023    6:08 PM 01/14/2023    8:43 PM 04/22/2022    1:34 PM 04/21/2022    2:36 PM 04/21/2022    2:00 PM 04/20/2022   11:10 AM  Advanced Directives  Does Patient Have a Medical Advance Directive? No  Yes Yes Yes Yes  Yes  Type of [employer] of [employer] Power of Attorney    Does patient want to make changes to medical advance directive?     No - Patient declined No - Patient declined No - Patient declined  Would patient like information on creating a medical advance directive? No - Patient declined          Current Medications (verified) Outpatient Encounter Medications as of 11/02/2023  Medication Sig   ACCU-CHEK AVIVA PLUS test strip CHECK BLOOD SUGAR AS INSTRUCTED   acetaminophen  (TYLENOL ) 650 MG CR tablet TAKE ONE TABLET BY MOUTH AT NOON   amLODipine  (NORVASC ) 10 MG tablet TAKE ONE TABLET BY MOUTH ONCE DAILY   atorvastatin  (LIPITOR) 40 MG tablet TAKE ONE TABLET BY MOUTH ONCE DAILY   azelastine  (OPTIVAR ) 0.05 % ophthalmic solution Place 1 drop into the right eye 2 (two) times daily.   BD INSULIN  SYRINGE U/F 31G X 5/16 1 ML MISC USE AS DIRECTED   BD PEN NEEDLE SHORT ULTRAFINE 31G X 8 MM MISC USE AS DIRECTED WITH NOVOLIN  TWICE DAILY   busPIRone  (BUSPAR ) 7.5 MG tablet TAKE ONE TABLET BY MOUTH THREE TIMES DAILY   cloNIDine  (CATAPRES ) 0.2 MG tablet TAKE ONE TABLET BY MOUTH THREE TIMES DAILY   COMBIGAN  0.2-0.5 % ophthalmic solution Place 1 drop into both eyes 2 (two) times daily.   Continuous Glucose Receiver (DEXCOM G7 RECEIVER) DEVI Use to monitor blood sugar daily dx: E11.49   dabigatran  (PRADAXA ) 150 MG CAPS capsule TAKE ONE CAPSULE BY MOUTH EVERY  TWELVE HOURS   docusate sodium  (COLACE) 100 MG capsule Take 1 capsule (100 mg total) by mouth 2 (two) times daily.   donepezil  (ARICEPT ) 10 MG tablet TAKE ONE TABLET BY MOUTH AT BEDTIME   estradiol  (ESTRACE ) 0.1 MG/GM vaginal cream Apply a pea-sized amount to vaginal opening using index finger 2-3 nights/week   ferrous sulfate  325 (65 FE) MG EC tablet Take 1 tablet (325 mg total) by mouth 2 (two) times daily.   hydrOXYzine  (ATARAX ) 25 MG tablet TAKE ONE TABLET BY MOUTH THREE  TIMES DAILY   imipramine  (TOFRANIL ) 50 MG tablet TAKE TWO TABLETS BY MOUTH AT BEDTIME   insulin  NPH-regular Human (NOVOLIN  70/30) (70-30) 100 UNIT/ML injection INJECT 55 UNITS INTO THE SKIN IN THE MORNING AND 50 UNITS IN THE EVENING   Insulin  Pen Needle (ULTICARE MINI PEN NEEDLES) 31G X 6 MM MISC USE TO INJECT NOVOLIN  TWICE DAILY.   Insulin  Syringe-Needle U-100 32G X 5/16 1 ML MISC 1 each by Does not apply route in the morning and at bedtime. Use to inject insulin  twice daily dx e11.65   latanoprost  (XALATAN ) 0.005 % ophthalmic solution 1 drop at bedtime.   meclizine  (ANTIVERT ) 25 MG tablet Take 1 tablet (25 mg total) by mouth 3 (three) times daily as needed for dizziness.   methenamine  (HIPREX ) 1 g tablet TAKE 1/2 TABLET BY MOUTH EVERY TWELVE HOURS   montelukast  (SINGULAIR ) 10 MG tablet TAKE ONE TABLET BY MOUTH AT BEDTIME   nystatin  powder Apply 1 Application topically 2 (two) times daily.   olopatadine  (PATANOL) 0.1 % ophthalmic solution Place 1 drop into both eyes 2 (two) times daily.   omeprazole  (PRILOSEC) 40 MG capsule TAKE ONE CAPSULE BY MOUTH TWICE DAILY   oxyCODONE -acetaminophen  (PERCOCET) 10-325 MG tablet Take 1 tablet by mouth every 6 (six) hours as needed for pain.   oxyCODONE -acetaminophen  (PERCOCET) 10-325 MG tablet Ake one tablet by mouth two times daily for chronic pain   oxyCODONE -acetaminophen  (PERCOCET) 10-325 MG tablet Take one tablet by mouth two times daily for severe  pain   oxyCODONE -acetaminophen  (PERCOCET) 10-325 MG tablet Take one tablet by mouth two times daily for pain   oxyCODONE -acetaminophen  (PERCOCET) 10-325 MG tablet Take one tablet by mouth two times daily for pain   oxyCODONE -acetaminophen  (PERCOCET) 10-325 MG tablet Take one tablet by mouth two times daily for pain   prednisoLONE acetate (PRED FORTE) 1 % ophthalmic suspension PLACE ONE DROP IN EACH EYE FOUR TIMES DAILY X42 DAYS (SIX WEEKS)   senna (SENOKOT) 8.6 MG tablet Take 1 tablet (8.6 mg total) by mouth 2  (two) times daily.   topiramate  (TOPAMAX ) 100 MG tablet TAKE ONE TABLET BY MOUTH TWICE DAILY   UNABLE TO FIND Standard wheelchair with elevating leg rests DX M25.561, M54.41   UNABLE TO FIND Skilled Nursing to eval and treat as indicated. Will need routine lab work and vital sign assessments.   UNABLE TO FIND Home Health to evaluate and treat as indicated. Dx: Z74.2   UNABLE TO FIND Med Name: toxassure 8 dx: PAIN MANAGEMENT CONTRACT Z08.89   Vitamin D , Ergocalciferol , (DRISDOL ) 1.25 MG (50000 UNIT) CAPS capsule TAKE ONE CAPSULE BY MOUTH ONCE WEEKLY   Continuous Glucose Sensor (DEXCOM G7 SENSOR) MISC Use to monitor blood sugar daily dx: e11.49   nitrofurantoin , macrocrystal-monohydrate, (MACROBID ) 100 MG capsule Take 1 capsule (100 mg total) by mouth 2 (two) times daily.   Facility-Administered Encounter Medications as of 11/02/2023  Medication   fentaNYL  (SUBLIMAZE ) injection 25-50 mcg  Allergies (verified) Penicillins, Fentanyl , Sulfonamide derivatives, Sulfur, and Codeine   History: Past Medical History:  Diagnosis Date   Anemia, iron deficiency 2012   Evaluated by Dr. Harvey; H&H of 9.3/30.8 with MCV-79 in 10/2010; 3/3 positive Hemoccult cards in 07/2011   Anxiety    was on Prozac  for a couple of weeks   Atrial fibrillation (HCC)    takes Coumadin  daily   Bruises easily    takes Coumadin    Chronic anticoagulation 07/21/2010   Chronic back pain    related to knee pain   Degenerative joint disease    Knees   Dementia (HCC)    Depression    Diabetes mellitus    takes Metformin  daily   Gastroesophageal reflux disease    takes Omeprazole  daily   Glaucoma    Glaucoma    uses eye drops at night   History of blood transfusion 1969   History of gout    was on medication but taken off of several months ago   Hx of colonic polyps    Hx of migraines    last one about a yr ago;takes Topamax  daily   Hyperlipidemia    takes Pravastatin  daily   Hypertension    takes Hyzaar daily  and Propranolol     Insomnia    takes Restoril  prn   Joint pain    Joint swelling    Memory loss    takes donepezil    Migraine    Migraine    Nocturia    Overweight(278.02)    Pneumonia 1969   hx of   Pulmonary embolism [facility]) May 2012   acute presentation, bilateral PE   Skin spots-aging    Urinary frequency    Past Surgical History:  Procedure Laterality Date   BIOPSY  04/22/2019   Procedure: BIOPSY;  Surgeon: Shaaron Lamar HERO, MD;  [facility];  Service: Endoscopy;;  duodenum   BIOPSY  04/22/2022   Procedure: BIOPSY;  Surgeon: Eartha Angelia Sieving, MD;  [facility];  Service: Gastroenterology;;   CATARACT EXTRACTION W/PHACO Right 04/11/2012   Procedure: CATARACT EXTRACTION PHACO AND INTRAOCULAR LENS PLACEMENT (IOC);  Surgeon: Oneil T. Roz, MD;  Location: [facility];  Service: Ophthalmology;  Laterality: Right;  CDE=9.61   CATARACT EXTRACTION W/PHACO Left 12/26/2012   Procedure: CATARACT EXTRACTION PHACO AND INTRAOCULAR LENS PLACEMENT (IOC);  Surgeon: Oneil T. Roz, MD;  Location: [facility];  Service: Ophthalmology;  Laterality: Left;  CDE:7.74   COLONOSCOPY  Jan 2002; 2012   2002: Dr. Claudene, ext. hemorrhoids, nl colon; 2012-adenomatous polyps, gastritis on EGD   COLONOSCOPY N/A 04/24/2019   Procedure: COLONOSCOPY;  Surgeon: Golda Claudis PENNER, MD;  [facility];  Service: Endoscopy;  Laterality: N/A;   CYSTOSCOPY W/ URETERAL STENT PLACEMENT Bilateral 02/25/2018   Procedure: CYSTOSCOPY WITH BILATERAL RETROGRADE PYELOGRAM/BILATERAL URETERAL STENT PLACEMENT, BLADDER BIOPSIES WITH FULGERATION;  Surgeon: Nieves Cough, MD;  Location: [facility];  Service: Urology;  Laterality: Bilateral;   CYSTOSCOPY/URETEROSCOPY/HOLMIUM LASER/STENT PLACEMENT Bilateral 03/21/2018   Procedure: CYSTOSCOPY/URETEROSCOPY/HOLMIUM LASER/STENT PLACEMENT;  Surgeon: Nieves Cough, MD;  Location[facility];  Service: Urology;  Laterality: Bilateral;  ONLY NEEDS 60 MIN    DILATION AND CURETTAGE OF UTERUS     ESOPHAGOGASTRODUODENOSCOPY  08/24/2011   ESOPHAGOGASTRODUODENOSCOPY; esophageal dilatation; Claudis PENNER Golda, MD;   ESOPHAGOGASTRODUODENOSCOPY N/A 04/22/2019   Procedure: ESOPHAGOGASTRODUODENOSCOPY (EGD);  Surgeon: Shaaron Lamar HERO, MD;  [facility];  Service: Endoscopy;  Laterality: N/A;   ESOPHAGOGASTRODUODENOSCOPY (EGD) WITH PROPOFOL   N/A 04/22/2022   Procedure: ESOPHAGOGASTRODUODENOSCOPY (EGD) WITH PROPOFOL ;  Surgeon: Eartha Angelia Sieving, MD;  [facility];  Service: Gastroenterology;  Laterality: N/A;  with possible esophageal dilation   GIVENS CAPSULE STUDY  08/18/2011   Procedure: GIVENS CAPSULE STUDY;  Surgeon: Claudis RAYMOND Rivet, MD;  [facility];  Service: Endoscopy;  Laterality: N/A;  730   HOT HEMOSTASIS  04/22/2022   Procedure: HOT HEMOSTASIS (ARGON PLASMA COAGULATION/BICAP);  Surgeon: Eartha Angelia, Sieving, MD;  [facility];  Service: Gastroenterology;;   KNEE ARTHROSCOPY W/ MENISCECTOMY  90's   Left   MALONEY DILATION  04/22/2019   Procedure: MALONEY DILATION;  Surgeon: Shaaron Lamar HERO, MD;  [facility];  Service: Endoscopy;;   ORIF ANKLE FRACTURE Right 90's   TONSILLECTOMY     TOTAL KNEE ARTHROPLASTY  10/20/2011   Procedure: TOTAL KNEE ARTHROPLASTY;  Surgeon: Sieving JULIANNA Chancy, MD;  Location: [facility];  Service: Orthopedics;  Laterality: Left;   UPPER GASTROINTESTINAL ENDOSCOPY     URETHRAL DILATION     Family History  Problem Relation Age of Onset   Diabetes Mother    Hypertension Mother    Arthritis Mother    Heart failure Mother    Migraines Mother    Kidney failure Mother    Leukemia Father    Migraines Father    Kidney failure Father    Diabetes Sister    Hypertension Sister    Diabetes Brother    Hypertension Brother    Hypertension Brother    Hypertension Brother    Hypertension Brother    Diabetes Brother    Diabetes Brother    Diabetes Brother    Pulmonary embolism  Sister    Migraines Sister    Kidney failure Brother    Colon cancer Neg Hx    Colon polyps Neg Hx    Social History   Socioeconomic History   Marital status: Widowed    Spouse name: Not on file   Number of children: 2   Years of education: 23   Highest education level: Not on file  Occupational History   Occupation: retired     Comment: Systems analyst Office manager)    Employer: RETIRED  Tobacco Use   Smoking status: Never   Smokeless tobacco: Never  Vaping Use   Vaping status: Never Used  Substance and Sexual Activity   Alcohol use: No   Drug use: No   Sexual activity: Not Currently    Birth control/protection: Post-menopausal  Other Topics Concern   Not on file  Social History Narrative   Lives with son.   Right-handed.   1 cup caffeine  per day.   Social Drivers of Corporate investment banker Strain: Low Risk  (11/02/2023)   Overall Financial Resource Strain (CARDIA)    Difficulty of Paying Living Expenses: Not hard at all  Food Insecurity: No Food Insecurity (11/02/2023)   Hunger Vital Sign    Worried About Running Out of Food in the Last Year: Never true    Ran Out of Food in the Last Year: Never true  Transportation Needs: No Transportation Needs (11/02/2023)   PRAPARE - Administrator, Civil Service (Medical): No    Lack of Transportation (Non-Medical): No  Physical Activity: Inactive (11/02/2023)   Exercise Vital Sign    Days of Exercise per Week: 0 days    Minutes of Exercise per Session: 0 min  Stress: No Stress Concern Present (09/14/2022)  [employer] of Occupational Health - Occupational Stress Questionnaire    Feeling of Stress : Not at all  Social Connections: Unknown (11/02/2023)   Social Connection and Isolation Panel    Frequency of Communication with Friends and Family: More than three times a week    Frequency of Social Gatherings with Friends and Family: More than three times a week    Attends Religious Services: Never    Automotive engineer or Organizations: No    Attends Engineer, structural: Never    Marital Status: Patient unable to answer    Tobacco Counseling Counseling given: Yes   Clinical Intake: Pre-visit preparation completed: Yes Pain : No/denies pain   BMI - recorded: 38.74 Nutritional Status: BMI > 30  Obese Nutritional Risks: None Diabetes: Yes CBG done?: No Did pt. bring in CBG monitor from home?: No Lab Results  Component Value Date   HGBA1C 6.7 (H) 04/25/2023   HGBA1C 7.1 (H) 12/01/2022   HGBA1C 6.9 (H) 07/28/2022    How often do you need to have someone help you when you read instructions, pamphlets, or other written materials from your doctor or pharmacy?: 1 - Never Interpreter Needed?: No Information entered by :: Eiko Mcgowen W CMA (AAMA)  Activities of Daily Living     11/02/2023    4:59 PM  In your present state of health, do you have any difficulty performing the following activities:  Hearing? 0  Vision? 1  Difficulty concentrating or making decisions? 1  Comment memory loss dx in chart  Walking or climbing stairs? 1  Dressing or bathing? 1  Doing errands, shopping? 1  Preparing Food and eating ? Y  Comment yes to preparing food, no to feeding herself  Using the Toilet? Y  In the past six months, have you accidently leaked urine? Y  Do you have problems with loss of bowel control? Y  Managing your Medications? Y  Managing your Finances? Y  Housekeeping or managing your Housekeeping? Y   Patient Care Team: Antonetta Rollene BRAVO, MD as PCP - General (Family Medicine) Alvan Dorn FALCON, MD as PCP - Cardiology (Cardiology) Beverley Evalene BIRCH, MD as Attending Physician (Orthopedic Surgery)   I have updated your Care Teams any recent Medical Services you may have received from other providers in the past year.     Assessment:   This is a routine wellness examination for [name].  Hearing/Vision screen Hearing Screening - Comments:: Caregiver states patient  doesn't have any difficulty hearing  Vision Screening - Comments:: Caregiver stated that patient was taken to [facility] for routine eye exam, however, it was found that her eye pressure was too high, so the provider advised them it was do no good to do the eye exam with the pressure as it was. Dr. Robynn is continuing to monitor pressure and have referred patient to [facility] in [place]  Goals Addressed             This Visit's Progress    LIFESTYLE - DECREASE FALLS RISK   On track       Depression Screen     11/02/2023    5:00 PM 06/21/2023    1:07 PM 09/14/2022    2:42 PM 09/14/2022    2:41 PM 07/28/2022    1:53 PM 04/27/2022   10:30 AM 10/30/2021   11:40 AM  PHQ 2/9 Scores  PHQ - 2 Score  2 2 2 2 2  0  PHQ- 9  Score  3   6 12    Exception Documentation Other- indicate reason in comment box        Not completed patient unable to answer. memory loss. difficulty answering these questions           Fall Risk     11/02/2023    4:56 PM 06/21/2023    1:07 PM 09/14/2022    2:41 PM 07/28/2022    1:53 PM 04/27/2022   10:30 AM  Fall Risk   Falls in the past year? 1 1 Exclusion - non ambulatory 1 0  Number falls in past yr: 0 0  0 0  Injury with Fall? 0 0  0 0  Risk for fall due to : History of fall(s);Impaired balance/gait;Impaired mobility   History of fall(s);Impaired balance/gait;Impaired mobility No Fall Risks  Follow up Falls evaluation completed;Education provided;Falls prevention discussed Falls evaluation completed  Falls evaluation completed Falls evaluation completed    MEDICARE RISK AT HOME:  Medicare Risk at Home Any stairs in or around the home?: No If so, are there any without handrails?: No Home free of loose throw rugs in walkways, pet beds, electrical cords, etc?: Yes Adequate lighting in your home to reduce risk of falls?: Yes Life alert?: No Use of a cane, walker or w/c?: Yes Grab bars in the bathroom?: Yes Shower chair or bench in  shower?: Yes Elevated toilet seat or a handicapped toilet?: Yes  TIMED UP AND GO: Was the test performed?  No  Cognitive Function: Unable: Due to language barrier, hearing or vision limitations or othermemory loss diagnosis in chart    11/02/2023    5:00 PM 09/10/2020   11:32 AM 04/23/2014    4:30 PM  MMSE - Mini Mental State Exam  Not completed: Unable to complete Unable to complete   Orientation to time   2   Orientation to Place   2   Registration   0   Attention/ Calculation   0   Recall   0   Language- name 2 objects   2   Language- repeat   1  Language- follow 3 step command   3   Language- read & follow direction   1   Write a sentence   1   Copy design   1   Total score   13      Data saved with a previous flowsheet row definition        09/11/2021    2:06 PM 09/10/2020   11:32 AM 09/10/2019    2:44 PM 08/09/2018    1:23 PM 08/02/2017    4:23 PM  6CIT Screen  What Year? 0 points -- 0 points 4 points 0 points  What month? 0 points -- 0 points 0 points 0 points  What time? 0 points -- 0 points 0 points 0 points  Count back from 20 2 points -- 0 points 0 points 0 points  Months in reverse 2 points -- 2 points 0 points 0 points  Repeat phrase 0 points -- 2 points 0 points 4 points  Total Score 4 points  4 points 4 points 4 points    Immunizations Immunization History  Administered Date(s) Administered   Fluad Quad(high Dose 65+) 12/04/2018, 11/28/2019, 11/13/2020, 12/22/2021   Fluad Trivalent(High Dose 65+) 12/01/2022   H1N1 12/19/2007   INFLUENZA, HIGH DOSE SEASONAL PF 12/21/2017   Influenza Split 11/23/2011   Influenza Whole 01/07/2004, 11/14/2007, 02/03/2009, 11/30/2009   Influenza,inj,Quad PF,6+ Mos 11/01/2012,  01/01/2014, 04/24/2015, 10/29/2015, 01/11/2017   Moderna Covid-19 Vaccine Bivalent Booster 58yrs & up 12/22/2021   Moderna SARS-COV2 Booster Vaccination 11/13/2020   PFIZER(Purple Top)SARS-COV-2 Vaccination 04/12/2019, 04/30/2019, 02/20/2020    Pneumococcal Conjugate-13 04/23/2014   Pneumococcal Polysaccharide-23 08/05/2010   Td 11/07/2003   Zoster, Live 04/12/2006    Screening Tests Health Maintenance  Topic Date Due   Zoster Vaccines- Shingrix (1 of 2) 09/23/1989   DTaP/Tdap/Td (2 - Tdap) 11/06/2013   OPHTHALMOLOGY EXAM  08/19/2021   FOOT EXAM  03/22/2023   Diabetic kidney evaluation - Urine ACR  07/28/2023   Influenza Vaccine  09/16/2023   COVID-19 Vaccine (7 - 2024-25 season) 10/17/2023   HEMOGLOBIN A1C  10/26/2023   Diabetic kidney evaluation - eGFR measurement  04/24/2024   Medicare Annual Wellness (AWV)  11/01/2024   Pneumococcal Vaccine: 50+ Years  Completed   DEXA SCAN  Completed   HPV VACCINES  Aged Out   Meningococcal B Vaccine  Aged Out   Hepatitis C Screening  Discontinued    Health Maintenance Health Maintenance Due  Topic Date Due   Zoster Vaccines- Shingrix (1 of 2) 09/23/1989   DTaP/Tdap/Td (2 - Tdap) 11/06/2013   OPHTHALMOLOGY EXAM  08/19/2021   FOOT EXAM  03/22/2023   Diabetic kidney evaluation - Urine ACR  07/28/2023   Influenza Vaccine  09/16/2023   COVID-19 Vaccine (7 - 2024-25 season) 10/17/2023   HEMOGLOBIN A1C  10/26/2023   Health Maintenance Items Addressed: Labs Ordered: A1C urine ACR  Additional Screening: Vision Screening: Recommended annual ophthalmology exams for early detection of glaucoma and other disorders of the eye. Would you like a referral to an eye doctor? No    Dental Screening: Recommended annual dental exams for proper oral hygiene  Community Resource Referral / Chronic Care Management: CRR required this visit?  No   CCM required this visit?  No  Plan:   I have personally reviewed and noted the following in the patient's chart:   Medical and social history Use of alcohol, tobacco or illicit drugs  Current medications and supplements including opioid prescriptions. Patient is currently taking opioid prescriptions. Information provided to patient regarding  non-opioid alternatives. Patient advised to discuss non-opioid treatment plan with their provider. Functional ability and status Nutritional status Physical activity Advanced directives List of other physicians Hospitalizations, surgeries, and ER visits in previous 12 months Vitals Screenings to include cognitive, depression, and falls Referrals and appointments  In addition, I have reviewed and discussed with patient certain preventive protocols, quality metrics, and best practice recommendations. A written personalized care plan for preventive services as well as general preventive health recommendations were provided to patient.   Aireona Torelli, CMA   11/02/2023   After Visit Summary: (Mail) Due to this being a telephonic visit, the after visit summary with patients personalized plan was offered to patient via mail   Notes: Nothing significant to report at this time.

## 2023-11-02 NOTE — Patient Instructions (Signed)
 [name],  Thank you for taking the time for your Medicare Wellness Visit. I appreciate your continued commitment to your health goals. Please review the care plan we discussed, and feel free to reach out if I can assist you further.                                                                                                                     Ongoing Care     Seeing your primary care provider every 3 to 6 months helps us  monitor your health and provide consistent, personalized care.   Orders: Lab Orders         Microalbumin / creatinine urine ratio         Bayer DCA Hb A1c Waived    Have these labs completed at [facility].  Wishing you good health and many blessings for the year to come  -Soua Lenk  Recommended Screenings:  Health Maintenance  Topic Date Due   Zoster (Shingles) Vaccine (1 of 2) 09/23/1989   DTaP/Tdap/Td vaccine (2 - Tdap) 11/06/2013   Eye exam for diabetics  08/19/2021   Complete foot exam   03/22/2023   Yearly kidney health urinalysis for diabetes  07/28/2023   Flu Shot  09/16/2023   COVID-19 Vaccine (7 - 2024-25 season) 10/17/2023   Hemoglobin A1C  10/26/2023   Yearly kidney function blood test for diabetes  04/24/2024   Medicare Annual Wellness Visit  11/01/2024   Pneumococcal Vaccine for age over 63  Completed   DEXA scan (bone density measurement)  Completed   HPV Vaccine  Aged Out   Meningitis B Vaccine  Aged Out   Hepatitis C Screening  Discontinued       11/02/2023    4:53 PM  Advanced Directives  Does Patient Have a Medical Advance Directive? No  Would patient like information on creating a medical advance directive? No - Patient declined    Advance Care Planning is important because it: Ensures you receive medical care that aligns with your values, goals, and preferences. Provides guidance to your family and loved ones, reducing the emotional burden of decision-making during critical moments.   Vision: Annual vision screenings are  recommended for early detection of glaucoma, cataracts, and diabetic retinopathy. These exams can also reveal signs of chronic conditions such as diabetes and high blood pressure.   Dental: Annual dental screenings help detect early signs of oral cancer, gum disease, and other conditions linked to overall health, including heart disease and diabetes.  Medicare recommends these wellness visits once per year to help you and your care team stay ahead of potential health issues. These visits are designed to focus on prevention, allowing your provider to concentrate on managing your acute and chronic conditions during your regular appointments.  Please note that Annual Wellness Visits do not include a physical exam. Some assessments may be limited, especially if the visit was conducted virtually. If needed, we may recommend a separate in-person follow-up with your provider.  Please  see the attached documents for additional preventive care recommendations.

## 2023-11-08 ENCOUNTER — Other Ambulatory Visit: Payer: Self-pay | Admitting: Family Medicine

## 2023-11-21 ENCOUNTER — Other Ambulatory Visit: Payer: Self-pay | Admitting: Family Medicine

## 2023-11-21 NOTE — Progress Notes (Incomplete)
 Assessment: - Urinary symptoms, recurring.  She does have significant incontinence but has limited mobility  -Positive urine cultures at times.  I do not think she has significant underlying abnormalities other than vaginal atrophic changes   Plan:   History of Present Illness:  7.1.2025: Initial visit for evaluation of recurrent UTI.  She has been seen in [place] at [facility] urology in the past.  She does have a history of recurring infections as well as history of bilateral ureteroscopy by Dr. Nieves in February 2020.  At that time she had a left ureteral and a right renal stone.  They were treated with holmium laser and stenting.  Prior to that, she had recurring multiply resistant E. coli UTIs, last seen in April, 2020.  At that time she grew a fairly sensitive E. coli.  Recent urine cultures: June, 2024--Aerococcus October, 2024--mixed urogenital flora December, 2024-insignificant growth March, 2025--Aerococcus May, 2025--pansensitive Proteus  Apparently, when she has some dysuria and urinary symptoms she brings a specimen in to the office.  Antibiotics prescribed, symptoms are apparently improved.  No associated gross hematuria, usually does not have cloudy urine, does not have associated abdominal pain or fever.  10.7.2025:     Past Medical History:  Past Medical History:  Diagnosis Date   Anemia, iron deficiency 2012   Evaluated by Dr. Harvey; H&H of 9.3/30.8 with MCV-79 in 10/2010; 3/3 positive Hemoccult cards in 07/2011   Anxiety    was on Prozac  for a couple of weeks   Atrial fibrillation (HCC)    takes Coumadin  daily   Bruises easily    takes Coumadin    Chronic anticoagulation 07/21/2010   Chronic back pain    related to knee pain   Degenerative joint disease    Knees   Dementia (HCC)    Depression    Diabetes mellitus    takes Metformin  daily   Gastroesophageal reflux disease    takes Omeprazole  daily   Glaucoma    Glaucoma    uses eye drops at  night   History of blood transfusion 1969   History of gout    was on medication but taken off of several months ago   Hx of colonic polyps    Hx of migraines    last one about a yr ago;takes Topamax  daily   Hyperlipidemia    takes Pravastatin  daily   Hypertension    takes Hyzaar daily and Propranolol     Insomnia    takes Restoril  prn   Joint pain    Joint swelling    Memory loss    takes donepezil    Migraine    Migraine    Nocturia    Overweight(278.02)    Pneumonia 1969   hx of   Pulmonary embolism [facility]) May 2012   acute presentation, bilateral PE   Skin spots-aging    Urinary frequency     Past Surgical History:  Past Surgical History:  Procedure Laterality Date   BIOPSY  04/22/2019   Procedure: BIOPSY;  Surgeon: Shaaron Lamar HERO, MD;  [facility];  Service: Endoscopy;;  duodenum   BIOPSY  04/22/2022   Procedure: BIOPSY;  Surgeon: Eartha Angelia Sieving, MD;  [facility];  Service: Gastroenterology;;   CATARACT EXTRACTION W/PHACO Right 04/11/2012   Procedure: CATARACT EXTRACTION PHACO AND INTRAOCULAR LENS PLACEMENT (IOC);  Surgeon: Oneil T. Roz, MD;  Location: [facility];  Service: Ophthalmology;  Laterality: Right;  CDE=9.61   CATARACT EXTRACTION W/PHACO Left 12/26/2012  Procedure: CATARACT EXTRACTION PHACO AND INTRAOCULAR LENS PLACEMENT (IOC);  Surgeon: Oneil T. Roz, MD;  Location: [facility];  Service: Ophthalmology;  Laterality: Left;  CDE:7.74   COLONOSCOPY  Jan 2002; 2012   2002: Dr. Claudene, ext. hemorrhoids, nl colon; 2012-adenomatous polyps, gastritis on EGD   COLONOSCOPY N/A 04/24/2019   Procedure: COLONOSCOPY;  Surgeon: Golda Claudis PENNER, MD;  [facility];  Service: Endoscopy;  Laterality: N/A;   CYSTOSCOPY W/ URETERAL STENT PLACEMENT Bilateral 02/25/2018   Procedure: CYSTOSCOPY WITH BILATERAL RETROGRADE PYELOGRAM/BILATERAL URETERAL STENT PLACEMENT, BLADDER BIOPSIES WITH FULGERATION;  Surgeon: Nieves Cough, MD;  Location: [facility];  Service: Urology;  Laterality: Bilateral;   CYSTOSCOPY/URETEROSCOPY/HOLMIUM LASER/STENT PLACEMENT Bilateral 03/21/2018   Procedure: CYSTOSCOPY/URETEROSCOPY/HOLMIUM LASER/STENT PLACEMENT;  Surgeon: Nieves Cough, MD;  Location: [facility];  Service: Urology;  Laterality: Bilateral;  ONLY NEEDS 60 MIN   DILATION AND CURETTAGE OF UTERUS     ESOPHAGOGASTRODUODENOSCOPY  08/24/2011   ESOPHAGOGASTRODUODENOSCOPY; esophageal dilatation; Claudis PENNER Golda, MD;   ESOPHAGOGASTRODUODENOSCOPY N/A 04/22/2019   Procedure: ESOPHAGOGASTRODUODENOSCOPY (EGD);  Surgeon: Shaaron Lamar HERO, MD;  [facility];  Service: Endoscopy;  Laterality: N/A;   ESOPHAGOGASTRODUODENOSCOPY (EGD) WITH PROPOFOL  N/A 04/22/2022   Procedure: ESOPHAGOGASTRODUODENOSCOPY (EGD) WITH PROPOFOL ;  Surgeon: Eartha Angelia Sieving, MD;  [facility];  Service: Gastroenterology;  Laterality: N/A;  with possible esophageal dilation   GIVENS CAPSULE STUDY  08/18/2011   Procedure: GIVENS CAPSULE STUDY;  Surgeon: Claudis PENNER Golda, MD;  [facility];  Service: Endoscopy;  Laterality: N/A;  730   HOT HEMOSTASIS  04/22/2022   Procedure: HOT HEMOSTASIS (ARGON PLASMA COAGULATION/BICAP);  Surgeon: Eartha Angelia, Sieving, MD;  [facility];  Service: Gastroenterology;;   KNEE ARTHROSCOPY W/ MENISCECTOMY  90's   Left   MALONEY DILATION  04/22/2019   Procedure: MALONEY DILATION;  Surgeon: Shaaron Lamar HERO, MD;  [facility];  Service: Endoscopy;;   ORIF ANKLE FRACTURE Right 90's   TONSILLECTOMY     TOTAL KNEE ARTHROPLASTY  10/20/2011   Procedure: TOTAL KNEE ARTHROPLASTY;  Surgeon: Sieving JULIANNA Chancy, MD;  Location: [facility] OR;  Service: Orthopedics;  Laterality: Left;   UPPER GASTROINTESTINAL ENDOSCOPY     URETHRAL DILATION      Allergies:  Allergies  Allergen Reactions   Penicillins Itching   Fentanyl  Itching   Sulfonamide Derivatives Hives   Sulfur    Codeine Itching and Rash    tussionex   Is tolerated by patient, no phenergan  dm     Family History:  Family History  Problem Relation Age of Onset   Diabetes Mother    Hypertension Mother    Arthritis Mother    Heart failure Mother    Migraines Mother    Kidney failure Mother    Leukemia Father    Migraines Father    Kidney failure Father    Diabetes Sister    Hypertension Sister    Diabetes Brother    Hypertension Brother    Hypertension Brother    Hypertension Brother    Hypertension Brother    Diabetes Brother    Diabetes Brother    Diabetes Brother    Pulmonary embolism Sister    Migraines Sister    Kidney failure Brother    Colon cancer Neg Hx    Colon polyps Neg Hx     Social History:  Social History   Tobacco Use   Smoking status: Never   Smokeless tobacco: Never  Vaping Use  Vaping status: Never Used  Substance Use Topics   Alcohol use: No   Drug use: No    Review of symptoms:  Constitutional:  Negative for unexplained weight loss, night sweats, fever, chills ENT:  Negative for nose bleeds, sinus pain, painful swallowing CV:  Negative for chest pain, shortness of breath, exercise intolerance, palpitations, loss of consciousness Resp:  Negative for cough, wheezing, shortness of breath GI:  Negative for nausea, vomiting, diarrhea, bloody stools GU:  Positives noted in HPI; otherwise negative for gross hematuria, dysuria, urinary incontinence Neuro:  Negative for seizures, poor balance, limb weakness, slurred speech Psych:  Negative for lack of energy, depression, anxiety Endocrine:  Negative for polydipsia, polyuria, symptoms of hypoglycemia (dizziness, hunger, sweating) Hematologic:  Negative for anemia, purpura, petechia, prolonged or excessive bleeding, use of anticoagulants  Allergic:  Negative for difficulty breathing or choking as a result of exposure to anything; no shellfish allergy; no allergic response (rash/itch) to materials, foods  Physical exam: There were no vitals taken for  this visit. GENERAL APPEARANCE: She is in a wheelchair.  Moderately obese.  Alert but not oriented HEENT: Atraumatic, Normocephalic, oropharynx clear. NECK: Supple without lymphadenopathy or thyromegaly. LUNGS: Clear to auscultation bilaterally. HEART: Regular Rate and Rhythm without murmurs, gallops, or rubs NEUROLOGIC:  Alert and oriented x 3, normal gait, CN II-XII grossly intact.  MENTAL STATUS:  Appropriate. BACK:  Non-tender to palpation.  No CVAT SKIN:  Warm, dry and intact.    Results: No results found. However, due to the size of the patient record, not all encounters were searched. Please check Results Review for a complete set of results.  I have reviewed prior patient's records from Dr. Antonetta  I have reviewed urinalysis--clear today  I have reviewed prior urine cultures  I reviewed prior imaging studies CT abdomen pelvis with contrast as of December

## 2023-11-22 ENCOUNTER — Ambulatory Visit: Admitting: Urology

## 2023-11-22 DIAGNOSIS — N390 Urinary tract infection, site not specified: Secondary | ICD-10-CM

## 2023-11-22 DIAGNOSIS — N952 Postmenopausal atrophic vaginitis: Secondary | ICD-10-CM

## 2023-11-29 DIAGNOSIS — H401134 Primary open-angle glaucoma, bilateral, indeterminate stage: Secondary | ICD-10-CM | POA: Diagnosis not present

## 2023-12-06 ENCOUNTER — Other Ambulatory Visit: Payer: Self-pay | Admitting: Family Medicine

## 2023-12-14 ENCOUNTER — Other Ambulatory Visit: Payer: Self-pay | Admitting: Family Medicine

## 2023-12-21 ENCOUNTER — Encounter (INDEPENDENT_AMBULATORY_CARE_PROVIDER_SITE_OTHER): Payer: Self-pay | Admitting: Gastroenterology

## 2023-12-21 DIAGNOSIS — I679 Cerebrovascular disease, unspecified: Secondary | ICD-10-CM | POA: Diagnosis not present

## 2023-12-21 DIAGNOSIS — H409 Unspecified glaucoma: Secondary | ICD-10-CM | POA: Diagnosis not present

## 2023-12-21 DIAGNOSIS — R011 Cardiac murmur, unspecified: Secondary | ICD-10-CM | POA: Diagnosis not present

## 2023-12-21 DIAGNOSIS — I130 Hypertensive heart and chronic kidney disease with heart failure and stage 1 through stage 4 chronic kidney disease, or unspecified chronic kidney disease: Secondary | ICD-10-CM | POA: Diagnosis not present

## 2023-12-21 DIAGNOSIS — I872 Venous insufficiency (chronic) (peripheral): Secondary | ICD-10-CM | POA: Diagnosis not present

## 2023-12-21 DIAGNOSIS — I509 Heart failure, unspecified: Secondary | ICD-10-CM | POA: Diagnosis not present

## 2023-12-21 DIAGNOSIS — M8580 Other specified disorders of bone density and structure, unspecified site: Secondary | ICD-10-CM | POA: Diagnosis not present

## 2023-12-21 DIAGNOSIS — E1140 Type 2 diabetes mellitus with diabetic neuropathy, unspecified: Secondary | ICD-10-CM | POA: Diagnosis not present

## 2023-12-21 DIAGNOSIS — Z86718 Personal history of other venous thrombosis and embolism: Secondary | ICD-10-CM | POA: Diagnosis not present

## 2023-12-21 DIAGNOSIS — Z87440 Personal history of urinary (tract) infections: Secondary | ICD-10-CM | POA: Diagnosis not present

## 2023-12-21 DIAGNOSIS — Z833 Family history of diabetes mellitus: Secondary | ICD-10-CM | POA: Diagnosis not present

## 2023-12-21 DIAGNOSIS — Z86711 Personal history of pulmonary embolism: Secondary | ICD-10-CM | POA: Diagnosis not present

## 2023-12-21 DIAGNOSIS — E669 Obesity, unspecified: Secondary | ICD-10-CM | POA: Diagnosis not present

## 2023-12-21 DIAGNOSIS — E785 Hyperlipidemia, unspecified: Secondary | ICD-10-CM | POA: Diagnosis not present

## 2023-12-21 DIAGNOSIS — R0902 Hypoxemia: Secondary | ICD-10-CM | POA: Diagnosis not present

## 2023-12-21 DIAGNOSIS — N189 Chronic kidney disease, unspecified: Secondary | ICD-10-CM | POA: Diagnosis not present

## 2023-12-21 DIAGNOSIS — E1122 Type 2 diabetes mellitus with diabetic chronic kidney disease: Secondary | ICD-10-CM | POA: Diagnosis not present

## 2023-12-21 DIAGNOSIS — Z9981 Dependence on supplemental oxygen: Secondary | ICD-10-CM | POA: Diagnosis not present

## 2023-12-21 DIAGNOSIS — Z59869 Financial insecurity, unspecified: Secondary | ICD-10-CM | POA: Diagnosis not present

## 2023-12-21 DIAGNOSIS — I4891 Unspecified atrial fibrillation: Secondary | ICD-10-CM | POA: Diagnosis not present

## 2023-12-21 DIAGNOSIS — Z79890 Hormone replacement therapy (postmenopausal): Secondary | ICD-10-CM | POA: Diagnosis not present

## 2023-12-21 DIAGNOSIS — Z794 Long term (current) use of insulin: Secondary | ICD-10-CM | POA: Diagnosis not present

## 2023-12-21 DIAGNOSIS — Z809 Family history of malignant neoplasm, unspecified: Secondary | ICD-10-CM | POA: Diagnosis not present

## 2023-12-21 DIAGNOSIS — N905 Atrophy of vulva: Secondary | ICD-10-CM | POA: Diagnosis not present

## 2023-12-21 DIAGNOSIS — J4489 Other specified chronic obstructive pulmonary disease: Secondary | ICD-10-CM | POA: Diagnosis not present

## 2023-12-21 DIAGNOSIS — F325 Major depressive disorder, single episode, in full remission: Secondary | ICD-10-CM | POA: Diagnosis not present

## 2023-12-21 DIAGNOSIS — G43909 Migraine, unspecified, not intractable, without status migrainosus: Secondary | ICD-10-CM | POA: Diagnosis not present

## 2023-12-21 DIAGNOSIS — G309 Alzheimer's disease, unspecified: Secondary | ICD-10-CM | POA: Diagnosis not present

## 2023-12-21 DIAGNOSIS — F411 Generalized anxiety disorder: Secondary | ICD-10-CM | POA: Diagnosis not present

## 2023-12-22 ENCOUNTER — Telehealth: Payer: Self-pay

## 2023-12-22 ENCOUNTER — Other Ambulatory Visit: Payer: Self-pay | Admitting: Internal Medicine

## 2023-12-22 MED ORDER — OXYCODONE-ACETAMINOPHEN 10-325 MG PO TABS: ORAL_TABLET | ORAL | 0 refills | Status: AC

## 2023-12-22 NOTE — Telephone Encounter (Signed)
 Copied from CRM [id number]. Topic: Clinical - Prescription Issue >> Dec 22, 2023  9:38 AM Ivette P wrote: Reason for CRM: Pt son Norleen called in to notify pharmacy sent refill request for oxyCODONE -Acetaminophen , please advise if can be approved for refill   [facility] - [place], [place] - [address] [place] [address] Phone: [phone]  Fax: [phone]  Pls review and follow up with pt

## 2023-12-22 NOTE — Addendum Note (Signed)
 Addended by: ANTONETTA ROLLENE BRAVO on: 12/22/2023 04:28 PM   Modules accepted: Orders

## 2023-12-22 NOTE — Telephone Encounter (Signed)
 Medication is refilled.Marland KitchenMarland Kitchen

## 2024-01-03 ENCOUNTER — Other Ambulatory Visit: Payer: Self-pay | Admitting: Family Medicine

## 2024-01-04 ENCOUNTER — Other Ambulatory Visit: Payer: Self-pay | Admitting: Urology

## 2024-01-04 DIAGNOSIS — N390 Urinary tract infection, site not specified: Secondary | ICD-10-CM

## 2024-01-09 ENCOUNTER — Telehealth: Payer: Self-pay

## 2024-01-09 ENCOUNTER — Other Ambulatory Visit (HOSPITAL_COMMUNITY): Payer: Self-pay

## 2024-01-09 NOTE — Telephone Encounter (Signed)
 Copied from CRM #[id number]. Topic: Clinical - Prescription Issue >> Jan 09, 2024 11:48 AM Edsel HERO wrote: Patient's son states that patient's insurance [facility] will not longer cover dabigatran  (PRADAXA ) 150 MG CAPS capsule as of January 1st. Patient's insurance has requested a prior authorization to continue covering the medication. Please advise patient's son.

## 2024-01-10 NOTE — Telephone Encounter (Signed)
 It does appear that it has been removed from the formulary for next year. It will not let us  enter in a prior authorization for the 2026 plan year yet. I will try to get an insurance approval for this as soon as the plan will allow.

## 2024-01-16 ENCOUNTER — Telehealth: Payer: Self-pay

## 2024-01-16 NOTE — Telephone Encounter (Signed)
 Copied from CRM #[id number]. Topic: Clinical - Prescription Issue >> Jan 16, 2024  2:17 PM Deaijah H wrote: Reason for CRM: Patients son called in stating insurance faxed a form and would like to know if it's been received and update on prior authorization, due to [facility] stating another thing. Please call [phone]

## 2024-01-17 ENCOUNTER — Telehealth: Payer: Self-pay | Admitting: Pharmacy Technician

## 2024-01-17 ENCOUNTER — Other Ambulatory Visit (HOSPITAL_COMMUNITY): Payer: Self-pay

## 2024-01-17 NOTE — Telephone Encounter (Signed)
 Pharmacy Patient Advocate Encounter   Received notification from Pt Calls Messages that prior authorization for Dabigatran  150mg  capsules is required/requested.   Insurance verification completed.   The patient is insured through [employer].   Per test claim: PA required; PA submitted to above mentioned insurance via Fax Key/confirmation #/EOC [id number] Status is pending  PA form has been faxed to [phone]

## 2024-01-18 ENCOUNTER — Other Ambulatory Visit: Payer: Self-pay | Admitting: Family Medicine

## 2024-01-18 NOTE — Telephone Encounter (Signed)
 Pharmacy Patient Advocate Encounter  Received notification from [employer] that Prior Authorization for Dabigatran  150mg  capsules has been APPROVED from 01/17/2024 to 02/14/2025.   PA #/Case ID/Reference #: [id number]

## 2024-01-21 ENCOUNTER — Other Ambulatory Visit: Payer: Self-pay | Admitting: Family Medicine

## 2024-01-23 ENCOUNTER — Telehealth: Payer: Self-pay

## 2024-01-23 MED ORDER — OXYCODONE-ACETAMINOPHEN 10-325 MG PO TABS: ORAL_TABLET | ORAL | 0 refills | Status: AC

## 2024-01-23 NOTE — Addendum Note (Signed)
 Addended by: ANTONETTA QUANT E on: 01/23/2024 12:43 PM   Modules accepted: Orders

## 2024-01-23 NOTE — Telephone Encounter (Signed)
Pt needing refill of oxycodone

## 2024-01-23 NOTE — Telephone Encounter (Signed)
 Script refilled

## 2024-01-27 ENCOUNTER — Encounter: Payer: Self-pay | Admitting: Family Medicine

## 2024-01-27 ENCOUNTER — Ambulatory Visit (INDEPENDENT_AMBULATORY_CARE_PROVIDER_SITE_OTHER): Admitting: Family Medicine

## 2024-01-27 VITALS — BP 138/84 | HR 76 | Resp 16

## 2024-01-27 DIAGNOSIS — E1159 Type 2 diabetes mellitus with other circulatory complications: Secondary | ICD-10-CM | POA: Diagnosis not present

## 2024-01-27 DIAGNOSIS — G8929 Other chronic pain: Secondary | ICD-10-CM | POA: Diagnosis not present

## 2024-01-27 DIAGNOSIS — Z0001 Encounter for general adult medical examination with abnormal findings: Secondary | ICD-10-CM | POA: Diagnosis not present

## 2024-01-27 DIAGNOSIS — I152 Hypertension secondary to endocrine disorders: Secondary | ICD-10-CM

## 2024-01-27 DIAGNOSIS — I10 Essential (primary) hypertension: Secondary | ICD-10-CM

## 2024-01-27 DIAGNOSIS — N1832 Chronic kidney disease, stage 3b: Secondary | ICD-10-CM

## 2024-01-27 DIAGNOSIS — L819 Disorder of pigmentation, unspecified: Secondary | ICD-10-CM | POA: Diagnosis not present

## 2024-01-27 DIAGNOSIS — E1169 Type 2 diabetes mellitus with other specified complication: Secondary | ICD-10-CM | POA: Diagnosis not present

## 2024-01-27 DIAGNOSIS — E118 Type 2 diabetes mellitus with unspecified complications: Secondary | ICD-10-CM | POA: Insufficient documentation

## 2024-01-27 DIAGNOSIS — E1149 Type 2 diabetes mellitus with other diabetic neurological complication: Secondary | ICD-10-CM

## 2024-01-27 DIAGNOSIS — E785 Hyperlipidemia, unspecified: Secondary | ICD-10-CM | POA: Diagnosis not present

## 2024-01-27 DIAGNOSIS — Z794 Long term (current) use of insulin: Secondary | ICD-10-CM | POA: Diagnosis not present

## 2024-01-27 DIAGNOSIS — Z0289 Encounter for other administrative examinations: Secondary | ICD-10-CM | POA: Insufficient documentation

## 2024-01-27 MED ORDER — OXYCODONE-ACETAMINOPHEN 10-325 MG PO TABS: ORAL_TABLET | ORAL | 0 refills | Status: AC

## 2024-01-27 NOTE — Patient Instructions (Addendum)
 F/U in 18 weeks   TSH, HBA1C,cmp and EGFr, hBA1C today   You are being referred to Podiatry and dermatology   Urine to be submitted for testing next week  Careful not to fall  Merry Christmas!  Thanks for choosing [facility], we consider it a privelige to serve you.

## 2024-01-27 NOTE — Assessment & Plan Note (Signed)
 Diabetes associated with hypertension, hyperlipidemia, obesity, and depression  [name] is reminded of the importance of commitment to daily physical activity for 30 minutes or more, as able and the need to limit carbohydrate intake to 30 to 60 grams per meal to help with blood sugar control.   The need to take medication as prescribed, test blood sugar as directed, and to call between visits if there is a concern that blood sugar is uncontrolled is also discussed.   [name] is reminded of the importance of daily foot exam, annual eye examination, and good blood sugar, blood pressure and cholesterol control.     Latest Ref Rng & Units 04/25/2023    2:02 PM 01/27/2023    6:21 PM 01/14/2023   10:57 PM 12/01/2022    3:08 PM 07/29/2022    6:52 PM  Diabetic Labs  HbA1c 4.8 - 5.6 % 6.7    7.1    Chol 100 - 199 mg/dL 872       HDL >60 mg/dL 45       Calc LDL 0 - 99 mg/dL 48       Triglycerides 0 - 149 mg/dL 786       Creatinine 9.42 - 1.00 mg/dL 8.65  8.57  8.50  8.53  1.22       01/27/2024    3:06 PM 01/27/2024    2:03 PM 11/02/2023    4:47 PM 08/16/2023    9:56 AM 06/21/2023    1:05 PM 01/28/2023   12:30 AM 01/28/2023   12:00 AM  BP/Weight  Systolic BP 138 163 -- 131 140 136 150  Diastolic BP 84 91 -- 77 84 98 69  Wt. (Lbs)   240      BMI   38.74 kg/m2          Latest Ref Rng & Units 01/27/2024    2:00 PM 08/19/2020   12:00 AM  Foot/eye exam completion dates  Eye Exam No Retinopathy  No Retinopathy      Foot Form Completion  Done      This result is from an external source.      Updated lab needed at/ before next visit.

## 2024-01-27 NOTE — Assessment & Plan Note (Signed)
 Hyperpigmented rash, scaly on shins and dorsum of feet, skin very dry also refer dermatology

## 2024-01-27 NOTE — Progress Notes (Signed)
 [name]     MRN: [id number]      DOB: [birth year]  Chief Complaint  Patient presents with   Medical Management of Chronic Issues    4 month follow up    Hearing Problem    Pt complains of hearing loss    Annual Exam    Annual exam with above concerns    HPI: Patient is in for annual physical exam. C/o hearing loss wants ears checked to see if wax build up  present. Son reports no increase noted in TV or radio volume and she listens to both avidly  Immunization is reviewed , and  updated if needed.   PE: BP 138/84   Pulse 76   Resp 16   SpO2 93%   Pleasant  female, alert and oriented x 3, in no cardio-pulmonary distress. Afebrile. HEENT No facial trauma or asymetry. Sinuses non tender. TM clear bilaterally Extra occullar muscles intact.. External ears normal, . Neck: decreased ROM, no adenopathy,JVD or thyromegaly.No bruits.  Chest: Clear to ascultation bilaterally.No crackles or wheezes. Non tender to palpation  Breast: No complaints/ concerns, denies mass , or nipple discharge Has decided to discontinue mammograms . No exam performed   Cardiovascular system; Heart sounds normal,  S1 and  S2 ,no S3.  No murmur, or thrill. l.  Abdomen: Soft, non tender,  Musculoskeletal exam: Decreased  ROM of spine, hips , shoulders and knees.Wheelchair dependent deformity ,swelling or crepitus noted.  Neurologic: Cranial nerves 2 to 12 intact.No significant hearing loss on exam Power, tone ,sensation decreased throughout.  disturbance in gait. No tremor.  Skin: Intact, hyperpigmented wart like rsah on dorsum of feet and shins bilaterally Psych; Normal mood and affect.   Assessment & Plan:  Annual visit for general adult medical examination with abnormal findings Annual exam as documented. Immunization to be updated at pharmacy Cancer screening is discontinued per patient preference   Encounter for chronic pain management The patient's Controlled  Substance registry is reviewed and compliance confirmed. Adequacy of  Pain control and level of function is assessed. Medication dosing is adjusted as deemed appropriate. Twelve weeks of medication is prescribed , with a follow up appointment between 16 weeks .   Type 2 diabetes mellitus with complication, with long-term current use of insulin  (HCC) Diabetes associated with hypertension, hyperlipidemia, obesity, and depression  [name] is reminded of the importance of commitment to daily physical activity for 30 minutes or more, as able and the need to limit carbohydrate intake to 30 to 60 grams per meal to help with blood sugar control.   The need to take medication as prescribed, test blood sugar as directed, and to call between visits if there is a concern that blood sugar is uncontrolled is also discussed.   [name] is reminded of the importance of daily foot exam, annual eye examination, and good blood sugar, blood pressure and cholesterol control.     Latest Ref Rng & Units 04/25/2023    2:02 PM 01/27/2023    6:21 PM 01/14/2023   10:57 PM 12/01/2022    3:08 PM 07/29/2022    6:52 PM  Diabetic Labs  HbA1c 4.8 - 5.6 % 6.7    7.1    Chol 100 - 199 mg/dL 872       HDL >60 mg/dL 45       Calc LDL 0 - 99 mg/dL 48       Triglycerides 0 - 149 mg/dL 786  Creatinine 0.57 - 1.00 mg/dL 8.65  8.57  8.50  8.53  1.22       01/27/2024    3:06 PM 01/27/2024    2:03 PM 11/02/2023    4:47 PM 08/16/2023    9:56 AM 06/21/2023    1:05 PM 01/28/2023   12:30 AM 01/28/2023   12:00 AM  BP/Weight  Systolic BP 138 163 -- 131 140 136 150  Diastolic BP 84 91 -- 77 84 98 69  Wt. (Lbs)   240      BMI   38.74 kg/m2          Latest Ref Rng & Units 01/27/2024    2:00 PM 08/19/2020   12:00 AM  Foot/eye exam completion dates  Eye Exam No Retinopathy  No Retinopathy      Foot Form Completion  Done      This result is from an external source.      Updated lab needed at/ before next  visit.   Pain management contract signed Pain contract reviewed and signed wit [name] and her son [name] who is  her primary caregiver UDS in next 1 week  Hyperlipidemia LDL goal <100 Hyperlipidemia:Low fat diet discussed and encouraged.   Lipid Panel  Lab Results  Component Value Date   CHOL 127 04/25/2023   HDL 45 04/25/2023   LDLCALC 48 04/25/2023   TRIG 213 (H) 04/25/2023   CHOLHDL 2.8 04/25/2023     Updated lab needed at/ before next visit.   Hyperpigmented skin lesion Hyperpigmented rash, scaly on shins and dorsum of feet, skin very dry also refer dermatology

## 2024-01-27 NOTE — Assessment & Plan Note (Signed)
 Hyperlipidemia:Low fat diet discussed and encouraged.   Lipid Panel  Lab Results  Component Value Date   CHOL 127 04/25/2023   HDL 45 04/25/2023   LDLCALC 48 04/25/2023   TRIG 213 (H) 04/25/2023   CHOLHDL 2.8 04/25/2023     Updated lab needed at/ before next visit.

## 2024-01-27 NOTE — Assessment & Plan Note (Signed)
 The patient's Controlled Substance registry is reviewed and compliance confirmed. Adequacy of  Pain control and level of function is assessed. Medication dosing is adjusted as deemed appropriate. Twelve weeks of medication is prescribed , with a follow up appointment between 16 weeks .

## 2024-01-27 NOTE — Assessment & Plan Note (Signed)
 Pain contract reviewed and signed wit Ms Gorder and her son [name] who is  her primary caregiver UDS in next 1 week

## 2024-01-27 NOTE — Assessment & Plan Note (Addendum)
 Annual exam as documented. Immunization to be updated at pharmacy Cancer screening is discontinued per patient preference

## 2024-01-28 ENCOUNTER — Ambulatory Visit: Payer: Self-pay | Admitting: Family Medicine

## 2024-01-28 LAB — LIPID PANEL
Chol/HDL Ratio: 3 ratio (ref 0.0–4.4)
Cholesterol, Total: 141 mg/dL (ref 100–199)
HDL: 47 mg/dL (ref >39)
LDL Chol Calc (NIH): 59 mg/dL (ref 0–99)
Triglycerides: 216 mg/dL — ABNORMAL HIGH (ref 0–149)
VLDL Cholesterol Cal: 35 mg/dL (ref 5–40)

## 2024-01-28 LAB — CMP14+EGFR
ALT: 8 IU/L (ref 0–32)
AST: 16 IU/L (ref 0–40)
Albumin: 4 g/dL (ref 3.7–4.7)
Alkaline Phosphatase: 121 IU/L (ref 48–129)
BUN/Creatinine Ratio: 13 (ref 12–28)
BUN: 18 mg/dL (ref 8–27)
Bilirubin Total: <0.2 mg/dL (ref 0.0–1.2)
CO2: 22 mmol/L (ref 20–29)
Calcium: 9.5 mg/dL (ref 8.7–10.3)
Chloride: 105 mmol/L (ref 96–106)
Creatinine, Ser: 1.4 mg/dL — ABNORMAL HIGH (ref 0.57–1.00)
Globulin, Total: 3 g/dL (ref 1.5–4.5)
Glucose: 82 mg/dL (ref 70–99)
Potassium: 4.4 mmol/L (ref 3.5–5.2)
Sodium: 138 mmol/L (ref 134–144)
Total Protein: 7 g/dL (ref 6.0–8.5)
eGFR: 37 mL/min/1.73 — ABNORMAL LOW (ref >59)

## 2024-01-28 LAB — TSH: TSH: 1.41 u[IU]/mL (ref 0.450–4.500)

## 2024-01-28 LAB — HEMOGLOBIN A1C
Est. average glucose Bld gHb Est-mCnc: 140 mg/dL
Hgb A1c MFr Bld: 6.5 % — ABNORMAL HIGH (ref 4.8–5.6)

## 2024-01-31 ENCOUNTER — Other Ambulatory Visit: Payer: Self-pay

## 2024-01-31 DIAGNOSIS — I10 Essential (primary) hypertension: Secondary | ICD-10-CM

## 2024-01-31 DIAGNOSIS — E118 Type 2 diabetes mellitus with unspecified complications: Secondary | ICD-10-CM

## 2024-01-31 MED ORDER — INSULIN ISOPHANE & REGULAR (HUMAN 70-30)100 UNIT/ML KWIKPEN: 45.0000 [IU] | PEN_INJECTOR | Freq: Two times a day (BID) | SUBCUTANEOUS | 2 refills | Status: AC

## 2024-02-05 LAB — MICROALBUMIN / CREATININE URINE RATIO
Creatinine, Urine: 74.1 mg/dL
Microalb/Creat Ratio: 96 mg/g{creat} — ABNORMAL HIGH (ref 0–29)
Microalbumin, Urine: 70.8 ug/mL

## 2024-02-07 LAB — TOXASSURE SELECT 13 (MW), URINE

## 2024-02-24 ENCOUNTER — Ambulatory Visit: Payer: Self-pay | Admitting: Podiatry

## 2024-02-29 ENCOUNTER — Other Ambulatory Visit: Payer: Self-pay | Admitting: Family Medicine

## 2024-03-02 ENCOUNTER — Other Ambulatory Visit: Payer: Self-pay | Admitting: Family Medicine

## 2024-03-13 ENCOUNTER — Ambulatory Visit: Admitting: Urology

## 2024-03-16 ENCOUNTER — Ambulatory Visit: Admitting: Podiatry

## 2024-06-01 ENCOUNTER — Ambulatory Visit: Payer: Self-pay | Admitting: Family Medicine

## 2024-06-08 ENCOUNTER — Ambulatory Visit: Admitting: Family Medicine

## 2024-11-06 ENCOUNTER — Ambulatory Visit
